# Patient Record
Sex: Male | Born: 1975 | Race: Black or African American | Hispanic: No | Smoking: Former smoker
Health system: Southern US, Community
[De-identification: ages and names within clinical notes are randomized; demographics above are authoritative.]

## PROBLEM LIST (undated history)

## (undated) DIAGNOSIS — T8859XA Other complications of anesthesia, initial encounter: Secondary | ICD-10-CM

## (undated) DIAGNOSIS — N186 End stage renal disease: Secondary | ICD-10-CM

## (undated) DIAGNOSIS — F329 Major depressive disorder, single episode, unspecified: Secondary | ICD-10-CM

## (undated) DIAGNOSIS — I2510 Atherosclerotic heart disease of native coronary artery without angina pectoris: Secondary | ICD-10-CM

## (undated) DIAGNOSIS — E119 Type 2 diabetes mellitus without complications: Secondary | ICD-10-CM

## (undated) DIAGNOSIS — I5042 Chronic combined systolic (congestive) and diastolic (congestive) heart failure: Secondary | ICD-10-CM

## (undated) DIAGNOSIS — Z9889 Other specified postprocedural states: Secondary | ICD-10-CM

## (undated) DIAGNOSIS — T8189XA Other complications of procedures, not elsewhere classified, initial encounter: Secondary | ICD-10-CM

## (undated) DIAGNOSIS — I70209 Unspecified atherosclerosis of native arteries of extremities, unspecified extremity: Secondary | ICD-10-CM

## (undated) DIAGNOSIS — R112 Nausea with vomiting, unspecified: Secondary | ICD-10-CM

## (undated) DIAGNOSIS — I8290 Acute embolism and thrombosis of unspecified vein: Secondary | ICD-10-CM

## (undated) DIAGNOSIS — H269 Unspecified cataract: Secondary | ICD-10-CM

## (undated) DIAGNOSIS — R011 Cardiac murmur, unspecified: Secondary | ICD-10-CM

## (undated) DIAGNOSIS — K219 Gastro-esophageal reflux disease without esophagitis: Secondary | ICD-10-CM

## (undated) DIAGNOSIS — I10 Essential (primary) hypertension: Secondary | ICD-10-CM

## (undated) DIAGNOSIS — Z973 Presence of spectacles and contact lenses: Secondary | ICD-10-CM

## (undated) DIAGNOSIS — Z9289 Personal history of other medical treatment: Secondary | ICD-10-CM

## (undated) DIAGNOSIS — I219 Acute myocardial infarction, unspecified: Secondary | ICD-10-CM

## (undated) DIAGNOSIS — F32A Depression, unspecified: Secondary | ICD-10-CM

## (undated) DIAGNOSIS — Z89512 Acquired absence of left leg below knee: Secondary | ICD-10-CM

## (undated) DIAGNOSIS — D649 Anemia, unspecified: Secondary | ICD-10-CM

## (undated) DIAGNOSIS — T4145XA Adverse effect of unspecified anesthetic, initial encounter: Secondary | ICD-10-CM

## (undated) DIAGNOSIS — Z992 Dependence on renal dialysis: Secondary | ICD-10-CM

## (undated) HISTORY — PX: AV FISTULA PLACEMENT: SHX1204

## (undated) HISTORY — DX: Unspecified atherosclerosis of native arteries of extremities, unspecified extremity: I70.209

---

## 2004-12-27 ENCOUNTER — Inpatient Hospital Stay (HOSPITAL_COMMUNITY): Admission: EM | Admit: 2004-12-27 | Discharge: 2004-12-31 | Payer: Self-pay | Admitting: Emergency Medicine

## 2004-12-29 ENCOUNTER — Encounter (INDEPENDENT_AMBULATORY_CARE_PROVIDER_SITE_OTHER): Payer: Self-pay | Admitting: Interventional Cardiology

## 2005-03-26 ENCOUNTER — Emergency Department (HOSPITAL_COMMUNITY): Admission: EM | Admit: 2005-03-26 | Discharge: 2005-03-26 | Payer: Self-pay | Admitting: Emergency Medicine

## 2005-05-21 ENCOUNTER — Inpatient Hospital Stay (HOSPITAL_COMMUNITY): Admission: EM | Admit: 2005-05-21 | Discharge: 2005-05-27 | Payer: Self-pay | Admitting: Emergency Medicine

## 2005-05-21 ENCOUNTER — Ambulatory Visit: Payer: Self-pay | Admitting: Pulmonary Disease

## 2005-05-28 ENCOUNTER — Ambulatory Visit: Payer: Self-pay | Admitting: Family Medicine

## 2005-06-27 ENCOUNTER — Ambulatory Visit: Payer: Self-pay | Admitting: *Deleted

## 2005-07-07 ENCOUNTER — Ambulatory Visit: Payer: Self-pay | Admitting: Internal Medicine

## 2005-07-21 ENCOUNTER — Ambulatory Visit: Payer: Self-pay | Admitting: Internal Medicine

## 2005-07-22 ENCOUNTER — Ambulatory Visit: Payer: Self-pay | Admitting: Internal Medicine

## 2005-07-30 ENCOUNTER — Ambulatory Visit: Payer: Self-pay | Admitting: Internal Medicine

## 2006-02-22 ENCOUNTER — Inpatient Hospital Stay (HOSPITAL_COMMUNITY): Admission: EM | Admit: 2006-02-22 | Discharge: 2006-02-24 | Payer: Self-pay | Admitting: Emergency Medicine

## 2006-02-23 ENCOUNTER — Encounter (INDEPENDENT_AMBULATORY_CARE_PROVIDER_SITE_OTHER): Payer: Self-pay | Admitting: Interventional Cardiology

## 2006-07-04 ENCOUNTER — Inpatient Hospital Stay (HOSPITAL_COMMUNITY): Admission: EM | Admit: 2006-07-04 | Discharge: 2006-07-07 | Payer: Self-pay | Admitting: Emergency Medicine

## 2006-07-05 ENCOUNTER — Encounter (INDEPENDENT_AMBULATORY_CARE_PROVIDER_SITE_OTHER): Payer: Self-pay | Admitting: Interventional Cardiology

## 2006-08-11 ENCOUNTER — Inpatient Hospital Stay (HOSPITAL_COMMUNITY): Admission: EM | Admit: 2006-08-11 | Discharge: 2006-08-27 | Payer: Self-pay | Admitting: Emergency Medicine

## 2006-08-12 ENCOUNTER — Encounter: Payer: Self-pay | Admitting: Vascular Surgery

## 2006-08-15 ENCOUNTER — Encounter: Payer: Self-pay | Admitting: Vascular Surgery

## 2006-08-15 ENCOUNTER — Ambulatory Visit: Payer: Self-pay | Admitting: *Deleted

## 2006-08-29 ENCOUNTER — Ambulatory Visit (HOSPITAL_COMMUNITY): Admission: RE | Admit: 2006-08-29 | Discharge: 2006-08-29 | Payer: Self-pay | Admitting: Vascular Surgery

## 2006-10-07 ENCOUNTER — Ambulatory Visit: Payer: Self-pay | Admitting: Vascular Surgery

## 2006-12-26 ENCOUNTER — Ambulatory Visit (HOSPITAL_COMMUNITY): Admission: RE | Admit: 2006-12-26 | Discharge: 2006-12-26 | Payer: Self-pay | Admitting: Nephrology

## 2007-04-18 ENCOUNTER — Encounter: Admission: RE | Admit: 2007-04-18 | Discharge: 2007-04-18 | Payer: Self-pay | Admitting: Nephrology

## 2008-03-06 ENCOUNTER — Encounter (INDEPENDENT_AMBULATORY_CARE_PROVIDER_SITE_OTHER): Payer: Self-pay | Admitting: Nurse Practitioner

## 2008-03-06 LAB — CONVERTED CEMR LAB
AST: 12 units/L
Basophils Relative: 0.5 %
Chloride: 90 meq/L
Cholesterol: 243 mg/dL
Eosinophils Relative: 3.3 %
Ferritin: 422 ng/mL
Glucose: 262 mg/dL
HCT: 39.4 %
Hemoglobin: 13.5 g/dL
MCHC: 34.3 g/dL
Monocytes Relative: 4.6 %
Potassium: 4.5 meq/L
TIBC: 232 ug/dL
Total Bilirubin: 1.1 mg/dL
UIBC: 92 ug/dL

## 2008-03-15 ENCOUNTER — Encounter (INDEPENDENT_AMBULATORY_CARE_PROVIDER_SITE_OTHER): Payer: Self-pay | Admitting: Nurse Practitioner

## 2008-10-28 ENCOUNTER — Encounter: Admission: RE | Admit: 2008-10-28 | Discharge: 2008-10-28 | Payer: Self-pay | Admitting: Nephrology

## 2009-03-29 ENCOUNTER — Emergency Department (HOSPITAL_COMMUNITY): Admission: EM | Admit: 2009-03-29 | Discharge: 2009-03-30 | Payer: Self-pay | Admitting: Emergency Medicine

## 2009-06-26 ENCOUNTER — Encounter: Admission: RE | Admit: 2009-06-26 | Discharge: 2009-06-26 | Payer: Self-pay | Admitting: Nephrology

## 2009-07-09 ENCOUNTER — Encounter: Admission: RE | Admit: 2009-07-09 | Discharge: 2009-07-09 | Payer: Self-pay | Admitting: Nephrology

## 2009-07-17 ENCOUNTER — Encounter: Admission: RE | Admit: 2009-07-17 | Discharge: 2009-07-17 | Payer: Self-pay | Admitting: Nephrology

## 2009-08-01 ENCOUNTER — Encounter: Admission: RE | Admit: 2009-08-01 | Discharge: 2009-08-01 | Payer: Self-pay | Admitting: Nephrology

## 2009-12-02 ENCOUNTER — Encounter: Admission: RE | Admit: 2009-12-02 | Discharge: 2009-12-02 | Payer: Self-pay | Admitting: Nephrology

## 2010-01-29 ENCOUNTER — Inpatient Hospital Stay (HOSPITAL_COMMUNITY): Admission: EM | Admit: 2010-01-29 | Discharge: 2010-02-02 | Payer: Self-pay | Admitting: Emergency Medicine

## 2010-02-01 ENCOUNTER — Encounter (INDEPENDENT_AMBULATORY_CARE_PROVIDER_SITE_OTHER): Payer: Self-pay | Admitting: Internal Medicine

## 2010-06-09 ENCOUNTER — Ambulatory Visit (HOSPITAL_COMMUNITY): Admission: RE | Admit: 2010-06-09 | Discharge: 2010-06-09 | Payer: Self-pay | Admitting: Nephrology

## 2010-11-01 LAB — URINALYSIS, ROUTINE W REFLEX MICROSCOPIC
Glucose, UA: NEGATIVE mg/dL
Ketones, ur: 15 mg/dL — AB
Protein, ur: >300 mg/dL — AB
Urobilinogen, UA: 0.2 mg/dL (ref 0.0–1.0)
pH: 8 (ref 5.0–8.0)

## 2010-11-01 LAB — RENAL FUNCTION PANEL
Albumin: 3.3 g/dL — ABNORMAL LOW (ref 3.5–5.2)
BUN: 62 mg/dL — ABNORMAL HIGH (ref 6–23)
CO2: 28 mEq/L (ref 19–32)
Chloride: 91 mEq/L — ABNORMAL LOW (ref 96–112)
Creatinine, Ser: 13.98 mg/dL — ABNORMAL HIGH (ref 0.4–1.5)
GFR calc Af Amer: 6 mL/min — ABNORMAL LOW (ref >60)
GFR calc non Af Amer: 5 mL/min — ABNORMAL LOW (ref >60)
Glucose, Bld: 108 mg/dL — ABNORMAL HIGH (ref 70–99)
Glucose, Bld: 82 mg/dL (ref 70–99)
Phosphorus: 4.5 mg/dL (ref 2.3–4.6)
Potassium: 3.9 mEq/L (ref 3.5–5.1)
Sodium: 131 mEq/L — ABNORMAL LOW (ref 135–145)

## 2010-11-01 LAB — PHOSPHORUS: Phosphorus: 5.3 mg/dL — ABNORMAL HIGH (ref 2.3–4.6)

## 2010-11-01 LAB — COMPREHENSIVE METABOLIC PANEL
ALT: 41 U/L (ref 0–53)
AST: 16 U/L (ref 0–37)
AST: 39 U/L — ABNORMAL HIGH (ref 0–37)
Albumin: 3.2 g/dL — ABNORMAL LOW (ref 3.5–5.2)
BUN: 25 mg/dL — ABNORMAL HIGH (ref 6–23)
CO2: 28 mEq/L (ref 19–32)
Calcium: 9.1 mg/dL (ref 8.4–10.5)
Chloride: 93 mEq/L — ABNORMAL LOW (ref 96–112)
Creatinine, Ser: 12.69 mg/dL — ABNORMAL HIGH (ref 0.4–1.5)
GFR calc Af Amer: 6 mL/min — ABNORMAL LOW (ref >60)
GFR calc Af Amer: 7 mL/min — ABNORMAL LOW (ref >60)
Potassium: 3.5 mEq/L (ref 3.5–5.1)
Sodium: 134 mEq/L — ABNORMAL LOW (ref 135–145)
Total Bilirubin: 0.9 mg/dL (ref 0.3–1.2)
Total Bilirubin: 1.1 mg/dL (ref 0.3–1.2)
Total Protein: 7.9 g/dL (ref 6.0–8.3)

## 2010-11-01 LAB — DIFFERENTIAL
Basophils Absolute: 0 10*3/uL (ref 0.0–0.1)
Lymphs Abs: 1.4 10*3/uL (ref 0.7–4.0)
Neutro Abs: 9.2 10*3/uL — ABNORMAL HIGH (ref 1.7–7.7)
Neutrophils Relative %: 82 % — ABNORMAL HIGH (ref 43–77)

## 2010-11-01 LAB — GLUCOSE, CAPILLARY
Glucose-Capillary: 119 mg/dL — ABNORMAL HIGH (ref 70–99)
Glucose-Capillary: 140 mg/dL — ABNORMAL HIGH (ref 70–99)
Glucose-Capillary: 141 mg/dL — ABNORMAL HIGH (ref 70–99)
Glucose-Capillary: 150 mg/dL — ABNORMAL HIGH (ref 70–99)
Glucose-Capillary: 193 mg/dL — ABNORMAL HIGH (ref 70–99)
Glucose-Capillary: 202 mg/dL — ABNORMAL HIGH (ref 70–99)
Glucose-Capillary: 222 mg/dL — ABNORMAL HIGH (ref 70–99)
Glucose-Capillary: 71 mg/dL (ref 70–99)
Glucose-Capillary: 78 mg/dL (ref 70–99)
Glucose-Capillary: 81 mg/dL (ref 70–99)
Glucose-Capillary: 94 mg/dL (ref 70–99)

## 2010-11-01 LAB — CBC
HCT: 34.6 % — ABNORMAL LOW (ref 39.0–52.0)
Hemoglobin: 11.7 g/dL — ABNORMAL LOW (ref 13.0–17.0)
Hemoglobin: 12 g/dL — ABNORMAL LOW (ref 13.0–17.0)
MCHC: 34 g/dL (ref 30.0–36.0)
MCHC: 34.2 g/dL (ref 30.0–36.0)
MCV: 84.6 fL (ref 78.0–100.0)
MCV: 85.7 fL (ref 78.0–100.0)
Platelets: 260 10*3/uL (ref 150–400)
Platelets: 278 10*3/uL (ref 150–400)
RBC: 4.17 MIL/uL — ABNORMAL LOW (ref 4.22–5.81)
RDW: 15.2 % (ref 11.5–15.5)
RDW: 15.4 % (ref 11.5–15.5)
WBC: 12.3 10*3/uL — ABNORMAL HIGH (ref 4.0–10.5)
WBC: 8.3 10*3/uL (ref 4.0–10.5)

## 2010-11-01 LAB — URINE MICROSCOPIC-ADD ON

## 2010-11-01 LAB — CULTURE, BLOOD (ROUTINE X 2)
Culture: NO GROWTH
Culture: NO GROWTH

## 2010-11-01 LAB — URINE CULTURE

## 2010-11-01 LAB — HEMOGLOBIN A1C: Hgb A1c MFr Bld: 8 % — ABNORMAL HIGH (ref <5.7)

## 2010-11-21 LAB — DIFFERENTIAL
Lymphocytes Relative: 29 % (ref 12–46)
Lymphs Abs: 3.3 10*3/uL (ref 0.7–4.0)
Monocytes Absolute: 1 10*3/uL (ref 0.1–1.0)
Monocytes Relative: 9 % (ref 3–12)
Neutro Abs: 6.7 10*3/uL (ref 1.7–7.7)
Neutrophils Relative %: 60 % (ref 43–77)

## 2010-11-21 LAB — CBC
Hemoglobin: 15.3 g/dL (ref 13.0–17.0)
RBC: 5.19 MIL/uL (ref 4.22–5.81)
WBC: 11.1 10*3/uL — ABNORMAL HIGH (ref 4.0–10.5)

## 2010-11-21 LAB — POCT I-STAT, CHEM 8
Calcium, Ion: 1.19 mmol/L (ref 1.12–1.32)
Creatinine, Ser: 10.2 mg/dL — ABNORMAL HIGH (ref 0.4–1.5)
Glucose, Bld: 212 mg/dL — ABNORMAL HIGH (ref 70–99)
HCT: 50 % (ref 39.0–52.0)
Hemoglobin: 17 g/dL (ref 13.0–17.0)
TCO2: 30 mmol/L (ref 0–100)

## 2011-01-01 NOTE — H&P (Signed)
[name], [name]           ACCOUNT NO.:  [id number]   MEDICAL RECORD NO.:  [id number]          PATIENT TYPE:  INP   LOCATION:  3111                         FACILITY:  [facility]   PHYSICIAN:  Edythe Lynn, M.D.       DATE OF BIRTH:  07-05-1976   DATE OF ADMISSION:  05/20/2005  DATE OF DISCHARGE:                                HISTORY & PHYSICAL   CHIEF COMPLAINT:  Altered mental status.   HISTORY OF PRESENT ILLNESS:  Mr. Kirner is a 35 year old African American man  with type 1 diabetes and nephrotic syndrome, with a history of Streptococcus  pneumonia with positive blood cultures in May 2006, who was brought into the  emergency room by his family because of an altered mental status.  Apparently the last time the patient was doing okay was yesterday afternoon  at about 6 p.m.  He arrived at the emergency room at around 2349 hours, so  about six hours after he was last doing fine.  At the time of arrival he was  very agitated and confused with an elevated blood pressure and really high  CBGs.  I was called to admit this patient by the emergency room physician.  When I arrived at the bedside, the patient was seizing.  He was with pupils  really dilated and eyes deviated to the right side.  We proceeded with  immediate sedation and orotracheal intubation and an emergent computed  tomography scan of the head, to rule out any intracranial bleed.   PAST MEDICAL HISTORY:  1.  Diabetes mellitus type 1.  2.  Streptococcus pneumonia in 2006.   HOME MEDICATIONS:  Insulin.   SOCIAL HISTORY:  The patient is single.  He smokes 1/2 pack of cigarettes a  day.  Does not drink alcohol.  The family report no cocaine use.   REVIEW OF SYSTEMS:  Unobtainable.   FAMILY HISTORY:  Per old records.  His mother is alive and has diabetes.   PHYSICAL EXAMINATION:  VITAL SIGNS:  Temperature 97.8 degrees, blood  pressure 218/116, heart rate 110, respirations 24.  The second set of vitals  show the blood  pressure 245/131, heart rate 142, respirations 22, saturation  92% on room air.  GENERAL:  Upon arrival, as stated above, the patient was seizing.  He was  unresponsive and after the seizure he started moving all four extremities,  but he was not cooperating or alert or doing anything meaningful.  He is an  obese gentleman and he has anasarca.  LUNGS:  Clear in the anterior fields.  HEART:  Tachycardic with regular heart rate, without murmurs, rubs or  gallops.  ABDOMEN:  Obese, soft.  Good bowel sounds.  SKIN:  No suspicious rashes.  NEUROLOGIC:  Impossible to perform but grossly the patient moves all four  extremities.   LABORATORY DATA:  On admission show a sodium of 127, potassium 2.8, chloride  95, bicarbonate 18, BUN 13, creatinine 1.5, glucose 581.  He has ketones  positive in the urine.   An electrocardiogram shows sinus tachycardia.  No ST-T changes.   He has acetone small in  the blood.  Phosphorus of 4.1, magnesium 1.1.  His  urinalysis is positive for ketones and proteins.  His albumin is really low  at 1.3.  His urine drug screen was negative for cocaine.  His white blood  cell count is 14,000, hemoglobin 9.7, platelet count 369.   A head CT that was obtained did not show any bleed.  A portable chest x-ray  showed very low lung volumes and no infiltrates.   ASSESSMENT/PLAN:  1.  Altered mental status.  At this point it is really unclear why this      patient has an altered mental status.  This could be a hypertensive      encephalopathy, but this is an unusual new onset hypertension with such      really elevated blood pressure readings.  Of note, the patient's      previous blood pressure levels were within normal limits.  Other      possibilities are status epilepticus-type seizures with post-ictal      confusion, but that would not explain the persistently elevated blood      pressure.  The plan at this moment is to sedate the patient and control      the blood  pressure with a Nipride drip, but monitor closely enough not      to drop the systolic blood pressure below 160.  If there are any      recurrent seizures, will give Dilantin IV.  Although the patient is not      febrile, we will have to be very careful to rule out any possible      meningitis.  We will try to obtain a neurological consultation for a      lumbar puncture, as this patient is really obese, and it will be very      difficult to perform a lumbar puncture.  Until the lumbar puncture is      performed, we are going to empirically treat him with Rocephin,      vancomycin and acyclovir.  2.  Diabetes ketoacidosis:  We will start the patient on intravenous      insulin.  We will be careful with aggressive hydration, as this patient      already has anasarca and very elevated blood pressure levels.  3.  Hypokalemia:  Will try to replete aggressively the potassium and abstain      from using IV insulin until we correct a little bit the potassium.  4.  Hyponatremia:  This is spurious, secondary to hyperglycemia.  5.  Nephrotic syndrome, likely from diabetes, but further workup can be done      if deemed necessary.  6.  GI prophylaxis will be done with Protonix, deep venous thrombosis      prophylaxis with Lovenox.      Edythe Lynn, M.D.  Electronically Signed     SL/MEDQ  D:  05/21/2005  T:  05/21/2005  Job:  [id number]

## 2011-01-01 NOTE — H&P (Signed)
[name], [name]           ACCOUNT NO.:  [id number]   MEDICAL RECORD NO.:  [id number]          PATIENT TYPE:  INP   LOCATION:  1828                         FACILITY:  [facility]   PHYSICIAN:  Benito Mccreedy, M.D.DATE OF BIRTH:  30-Nov-1975   DATE OF ADMISSION:  02/22/2006  DATE OF DISCHARGE:                                HISTORY & PHYSICAL   CHIEF COMPLAINT:  Nausea and vomiting.   HISTORY OF PRESENT ILLNESS:  The patient is a 35 year old gentleman who  presented with several days history of nausea with vomiting.  Onset was  gradual.  The vomiting was not projectile in nature.  He has had some  epigastric abdominal pain as well as headaches.  There has not been any  diarrhea, constipation, fever or chills or dysuria.  At admission, the  patient was noted to be hypertensive with systolic blood pressure as high as  221.  He was also noted to be hyperglycemic.  His potassium was noted to be  very low.  He was given potassium chloride IV with insulin, and hospitalist  service was __________  for admission after initiation of labetalol IV push.   PAST MEDICAL HISTORY:  1.  Positive for history of poorly controlled type 1 diabetes with      hemoglobin A1C of 16.5 in October 2006.  2.  He is also suspected to have nephrotic syndrome and has been seen      apparently by Dr. Mercy Moore of nephrology.  3.  History of hypertension which he is poorly controlled on.  4.  History of obesity and hypoalbuminemia.   MEDICATION HISTORY:  The patient does not have any known medication  allergies.  He is on:  1.  Labetalol 400 mg b.i.d.  2.  Lantus 20 units q.h.s.  3.  Lisinopril 20 mg daily.   FAMILY HISTORY:  Positive for diabetes mellitus.   SOCIAL HISTORY:  The patient smokes half pack of cigarettes daily but denies  use of alcohol or any illicit drugs.   REVIEW OF SYSTEMS:  As noted above.  Otherwise, unremarkable.   PHYSICAL EXAMINATION:  VITAL SIGNS:  Blood pressure was 221/125,  heart rate  68, respirations 20, temp 97.5 degrees Fahrenheit, O2 sat of 99%.  GENERAL:  He was not in acute cardiopulmonary distress.  HEENT:  Normocephalic, atraumatic.  Pupils were equal and reactive to light.  Extraocular movements intact.  Mucous membranes were dry.  NECK:  Supple.  No JVD.  CARDIAC:  Regular rate and rhythm.  No gallops or murmur.  ABDOMEN:  Obese, soft, nontender.  Bowel sounds present.  LUNGS:  Clear to auscultation.  EXTREMITIES:  The patient had 2+ bipedal pitting edema.  __________  focal deficits.   LABORATORIES:  WBC count 8.3, hemoglobin 13.8, hematocrit 40.5, platelet  count 578.  Sodium 127, potassium was less than 2 on arrival.  Chloride 88,  CO2 of 23, glucose 760, BUN 15, creatinine 2.5, bilirubin 1.2, __________ is  75, AST 18, ALT 10, total protein 5.1, albumin 1.1, calcium 7.9, lipase 20.  UA indicated clear appearance.  Specific gravity of 1.026, __________ 6.5,  glucose  of more than 1,000 mg/dL, negative bilirubin, ketones 15, moderate  blood, protein more than 300, urobilinogen 0.2, nitrites negative,  leukocytes negative, few squamous epithelial cells.  WBCs 0-2 per high-  powered field, RBCs 7-10 per high-powered field.  There __________  noted.   IMPRESSION:  1.  Hyperosmolar hyperglycemia state.  2.  Hypertensive urgency/isolated hypertension.  3.  Severe hypokalemia secondary to gastrointestinal loss of potassium.  4.  Bilateral lower extremity edema.  5.  Hypoalbuminemia.  6.  Renal insufficiency with possible nephritic syndrome.   PLAN:  The patient was started on careful IV fluid supplementation with IV  insulin Glucommander protocol __________  .  Careful IV fluid  supplementation with IV insulin Glucommander protocol.  The patient had  severe hypokalemia.  Aggressive potassium repletion __________  .  Will get  a 12-lead EKG and 2D echocardiogram with evidence of LVH or any cardiac  disease.  Will check a hemoglobin A1C.  Will  start him on labetalol drip.  The patient has received IV labetalol without any significant improvement in  his blood pressure and titrated to keep his systolic blood pressure below  150.  __________ electrolytes he was treated.  Will check his fasting lipid  panel as well.      Benito Mccreedy, M.D.  Electronically Signed     GO/MEDQ  D:  02/23/2006  T:  02/23/2006  Job:  [id number]

## 2011-01-01 NOTE — Consult Note (Signed)
NAMEFREDERIC, [name]           ACCOUNT NO.:  [id number]   MEDICAL RECORD NO.:  [id number]          PATIENT TYPE:  INP   LOCATION:  [id number]                         FACILITY:  [facility]   PHYSICIAN:  Larey Seat, M.D.  DATE OF BIRTH:  Nov 20, 1975   DATE OF CONSULTATION:  05/21/2005  DATE OF DISCHARGE:  05/27/2005                                   CONSULTATION   HISTORY OF PRESENT ILLNESS:  [name] is a 35 year old African-  American gentleman right-handed with a type one diabetes history and  nephrotic syndrome who was about three weeks prior to this admission  admitted to the [facility] system with streptococcus pneumonia  which also caused a cough bacteremia and he had positive blood cultures as  far back as May 2006.  At that time he was brought into the emergency room  because of shortness of breath and fever.  This time because of altered  mental status.  Apparently the last time the patient was doing quite well  was on May 19, 2005 in the afternoon prior to his admission and he  arrived in the emergency room about midnight last was seen six hours prior  doing well.  At the time of his arrival he seemed to be in delusions,  confused, had a very highly elevated blood pressure and abnormal blood  glucose levels in the 500 range.  The patient was admitted to the internal  medicine service under the attending doctor Dr. Edythe Lynn and the patient  was seizing when Dr. Marye Round and examined him in the ER.  His pupils are widely  dilated, his eyes were deviated to the right side indicating a complex  partial seizure.  The patient was sedated and intubated in the ER and then  admitted to 3100 neurological ICU floor where I was asked to see him in a  consultation and CT scan that was obtained showed no abnormal intracranial  structures especially no bleed.   PAST MEDICAL HISTORY:  Streptococcus pneumonia, diabetes mellitus, new onset  seizure, mental status  changes.   CURRENT MEDICATIONS:  Home medications include insulin only.   SOCIAL HISTORY:  The patient is single.  Smokes daily.  Does not drink  alcohol. The family has not reported any illegal drug use.   REVIEW OF SYSTEMS:  The review of systems was unobtainable but according to  his family members he has had trouble with memory, fatigability and had over  the summer increasing shortness of breath.   FAMILY HISTORY:  His mother is alive.  Had diabetes type 2.  There are  healthy siblings per old records.  There is also a family history of  hypertension and congestive heart failure.   PHYSICAL EXAMINATION:  VITAL SIGNS:  Blood pressure is now 164/100, heart  rate is still 105, respirations 22.  The patient is no longer seizing but is  severely sedated now after Ativan was given.  He is not able to cooperate  without falling asleep during the examination.  He is a rather obese  gentleman given his young age of 11 and he has anasarca.  He is producing  downgoing toes to plantar stimulation.  His deep tendon reflexes however are  very attenuated.  His eye movements seem to cover the full horizontal plane  now.  After sternal rub he is able to open his eyes and look from left-to-  right, right-to-left. I was also able to get him to move his neck. I see no  rigidity or meningism there.  There is no Kernig or Brudzinski sign,   The patient does not cooperate well with the sensory examination but  withdraws all extremities to be to pinch or to tickle.   LABORATORY DATA:  His white blood cell count is still about 14,000, platelet  count is around 370,000.  Urine is positive for ketones and protein.   DISPOSITION:  Altered mental status. This is an encephalopathy likely of  metabolic origin but the patient's seize is rather unusual for high a serum  glucose levels but the seizure might also explain his high blood pressure.  I agree with the control of blood pressure with a Nipride or  labetalol drip  and with the treatment of seizures by Dilantin - Phosphenytoin IV.  I was  asked by Dr. Edythe Lynn if I could perform a lumbar puncture but first of  all the patient is very obese and it is hard to treat him to do the lumbar  puncture in the ICU setting with an obese patient.  Secondarily he does not  show any signs of meningism.  He might well still be at risk for an  encephalitis.  I agree therefore with the empirical treatment of Rocephin,  Vancomycin and acyclovir and explained to his family that that should cover  all possible infectious agents that we know that could cause an  encephalitis.  It is my feeling however, that the diabetic ketoacidosis is  at the base of the patient's mental status changes and seizures.  An EEG is  pending and is ordered for later today.           ______________________________  Larey Seat, M.D.     CD/MEDQ  D:  06/03/2005  T:  06/03/2005  Job:  [id number]   cc:   Dr. _____ Audria Nine (?)

## 2011-01-01 NOTE — H&P (Signed)
NAMEJIDENNA, [name] NO.:  [id number]   MEDICAL RECORD NO.:  [id number]          PATIENT TYPE:  EMS   LOCATION:  MAJO                         FACILITY:  [facility]   PHYSICIAN:  Delanna Ahmadi, M.D.  DATE OF BIRTH:  [birth year]   DATE OF ADMISSION:  08/11/2006  DATE OF DISCHARGE:                              HISTORY & PHYSICAL   CHIEF COMPLAINT:  Right leg pain and progressive swelling.   HISTORY OF PRESENT ILLNESS:  This is a 35 year old African-American male  with history of insulin-requiring diabetes mellitus, nephrotic syndrome,  chronic renal insufficiency, anasarca, hypertension, and severe  dyslipidemia who presents with a couple-day history of right lateral  calf and knee pain with ambulation and progressive swelling.  He denies  fevers, chills, cough, shortness of breath, or chest pain.  He was  admitted to the hospital on November 19, with a hyperosmolar state,  anasarca, acute renal failure, hypokalemia, and dyslipidemia.  During  that admission he had a two-dimensional echocardiogram which showed a  borderline low ejection fraction.  His ACE inhibitor was stopped and he  was placed on Norvasc and continued on labetalol for blood pressure.  Insulin was continued for blood sugars.  After 2 days, he felt like  nothing was getting done for him and he left AMA.  He does not have a  primary physician.  He is applying on Medicaid and plans on attending  [facility] for his primary care.  He has seen Louis Meckel,  M.D. of the renal service but has not followed up with him in  approximately 1 year.   PAST MEDICAL HISTORY:  As noted above.   PAST SURGICAL HISTORY:  Not addressed.   ALLERGIES:  No known drug allergies.   CURRENT MEDICATIONS:  1. Insulin Lantus 20 units q.h.s.  2. Labetalol 400 mg b.i.d.  3. Norvasc 5 mg a day.  4. Question Zocor.   FAMILY HISTORY:  Mother with diabetes mellitus, hypertension, and renal  failure.  Father died  of lung cancer.  Sister and brother with diabetes  mellitus.   SOCIAL HISTORY:  He is married.  Unemployed.  He was in prison for 8  years and has been out of prison for 2-1/2 years.  History of cocaine  use, positive UDS in November of 2007.  No alcohol.  He is a smoker.   REVIEW OF SYSTEMS:  As above.   PHYSICAL EXAMINATION:  GENERAL:  Obese black male.  VITAL SIGNS:  Blood pressure 175/108, heart rate 96, respirations 22,  temperature 98.1, oxygen saturation 100% on room air.  HEENT:  Pupils are equal and respond to light.  Extraocular movements  are intact.  Ears blocked by cerumen.  Oropharynx is clear.  NECK:  Supple.  No lymphadenopathy.  LUNGS:  Some crackles at the bases.  HEART:  Regular rate and rhythm without murmurs, rubs, or gallops.  ABDOMEN:  Obese, soft, and nontender.  No mass or hepatosplenomegaly.  EXTREMITIES:  Show 4+ bilateral pretibial edema.  The right leg shows  some warmth and mild hyperpigmentation in the right lateral upper calf.  There is  no pain on motion of the knee.  NEUROLOGY:  Nonfocal.   LABORATORY DATA:  CBC; WBC 16.9, hemoglobin 8.8, MCV 81.0, platelet  count 595.  Chemistries; sodium 139, potassium 2.5, chloride 108,  bicarbonate 23, glucose 92, BUN 29, creatinine 4.2, calcium 7.2, total  protein 5.1.  Albumin less than 1.0, SGOT 20, SGPT 13, alkaline  phosphatase elevated.  UA shows greater than 300 of protein, no white  cells.   EKG, chest x-ray, PT, and PTT pending.   IMPRESSION:  1. Question right leg cellulitis versus deep venous thrombosis.  2. Acute on chronic renal failure.  3. Nephrotic syndrome/anasarca.  4. Hypertension, poorly controlled.  5. Diabetes mellitus type 2 on insulin.  6. Anemia likely related to chronic renal insufficiency.  7. Poor medical follow-up/probable noncompliance.  8. History of cocaine abuse, denies any since discharge on July 06, 2006.   PLAN:  Admit.  IV antibiotics per pharmacy.  Renal  consult.  Venous  ultrasound to rule out DVT in lower extremities.  Diuresis.  Watch  creatinine.  Blood pressure medicines to follow.  Cover with Lovenox for  now.  Anemia workup.           ______________________________  Delanna Ahmadi, M.D.     JJG/MEDQ  D:  08/11/2006  T:  08/11/2006  Job:  [id number]

## 2011-01-01 NOTE — Op Note (Signed)
NAMEJEMERY, [name]           ACCOUNT NO.:  [id number]   MEDICAL RECORD NO.:  [id number]          PATIENT TYPE:  INP   LOCATION:  5511                         FACILITY:  [facility]   PHYSICIAN:  Brevin Durkee. Ninfa Linden, M.D.DATE OF BIRTH:  1975-11-22   DATE OF PROCEDURE:  08/19/2005  DATE OF DISCHARGE:                               OPERATIVE REPORT   PREOPERATIVE DIAGNOSES:  1. Right leg/knee abscess.  2. Right knee joint effusion.   PROCEDURE:  1. Open irrigation and debridement of right knee/leg abscess.  2. Irrigation and debridement of right knee joint.   FINDINGS:  1. Large purulent abscess, right lateral knee, that is superficial to      the iliotibial band.  2. Moderate to large right knee effusion.   PROCEDURE:  1. Irrigation and debridement of right knee abscess.  2. Irrigation and debridement of right knee joint.   SURGEON:  Camdynn Aldis. Ninfa Linden, M.D.   ANESTHESIA:  General.   ANTIBIOTICS:  One gram of vancomycin.   CULTURES:  Sent and pending.   BLOOD LOSS:  Less than 50 mL.   COMPLICATIONS:  None.   INDICATIONS:  Briefly, [name] is a 35 year old male who was  admitted on August 11, 2006 with increased right leg pain and  swelling, thought to have a presumptive diagnosis of cellulitis.  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  He is on dialysis and he has been considered for some type of  grafting procedure for dialysis access.  After several days in the  hospital and no response with antibiotics, an MRI was obtained of his  right knee and leg and it showed a moderate joint fusion as well as a  large abscess superficial to the iliotibial band on the lateral aspect  of the right knee; this was performed yesterday and Orthopedic Surgery  was consulted today to address the abscess.  Upon discussion with the  patient, he said that he has had this pain in his leg for about a month  now.  On examination, he  had significant erythema about his leg and a  palpable fluctuant area and significant pain with range of motion of his  knee.  I did review the MRI and it showed the aforementioned abscess and  knee effusion, and I deemed it necessary to bring him to the operating  room on the day of consultation for urgent irrigation and debridement.  The risks and benefits of this were explained to him and well understood  and he agreed to proceed with surgery.   PROCEDURE NOTE:  After informed consent was obtained, the appropriate  right leg was marked.  [name] was brought to the operating room and  placed supine on the operating table.  A general anesthesia was then  obtained and a nonsterile tourniquet was placed around his upper right  thigh and his leg was prepped and draped with DuraPrep and sterile  drapes including sterile stockinette.  I then elevated the leg and the  tourniquet was set to 350 mL of pressure.  I made a direct lateral  incision over  the abscessed area and carried this down to the deep  tissues and an abundant gross purulent abscess was noted.  I spread this  further proximally and distally to expose the abscess and cultures were  obtained and sent.  He was given antibiotics and I copiously drained the  abscess.  This did not appear to be related to the knee joint itself.  I  then used 1000 mL of bacitracin solution using pulsatile lavage and then  another 3 L of normal saline solution using pulsatile lavage; I  thoroughly cleaned this wound.  Next, I turned my attention toward the  knee joint.  Two small portals were made, one at the superolateral  aspect of the knee joint and one at the anterolateral aspects just  lateral to the patellar tendon.  Two arthroscopic cannulas were inserted  and a large knee effusion was drained from the knee.  I then used  pulsatile lavage to run 3 L of normal saline solution through the knee  joint as well to copiously wash this out.  After  this was done, all  instrumentation was removed and the knee was drained of residual fluid.  The portal sites were closed with interrupted 2-0 nylon suture and then  I closed the lateral wound, just the skin tissue with an interrupted 2-0  nylon suture, but prior to closure, I did place a Penrose drain.  Xeroform followed by ABDs and a well-padded sterile dressing were  applied and the patient was awakened, extubated, and taken to the  recovery room in stable condition.  Cultures will be pending and he will  be continued on antibiotics.           ______________________________  [name]. Ninfa Linden, M.D.     CYB/MEDQ  D:  08/19/2006  T:  08/20/2006  Job:  [id number]

## 2011-01-01 NOTE — Discharge Summary (Signed)
[name], [name]           ACCOUNT NO.:  [id number]   MEDICAL RECORD NO.:  [id number]          PATIENT TYPE:  INP   LOCATION:  5511                         FACILITY:  [facility]   PHYSICIAN:  Helen Hashimoto, MD    DATE OF BIRTH:  February 27, 1976   DATE OF ADMISSION:  08/11/2006  DATE OF DISCHARGE:  08/27/2006                               DISCHARGE SUMMARY   ANTICIPATED DATE OF DISCHARGE:  August 27, 2006.   PRIMARY PHYSICIAN:  Delanna Ahmadi, M.D.   This patient left against medical advice.   DIAGNOSES:  At the time he left, include:  1. Left leg cellulitis with abscess with staphylococcus aureus.  2. End-stage renal disease.  3. Diabetes mellitus.  4. Hypertension.  5. History of anemia.  6. Dyslipidemia.  7. Obesity.  8. History of cocaine and marijuana use.  9. History of retinopathy.   Brief note about the hospital course, this patient is a 35 year old  African American male who was admitted on August 11, 2006, with a  complaint of right leg pain and swelling.  At that time, the patient had  an abscess with cellulitis that was drained and cultures were sent.  His  cultures were positive for staph aureus and the patient was started on  appropriate antibiotics.  This patient was seen by ID and his  antibiotics were adjusted.  Recommended to be discharged home on two  weeks of oral antibiotics.  The patient also had end stage renal disease  and he was seen by nephrology here in the hospital.  He was started on  hemodialysis during his hospitalization and he is supposed to continued  dialysis as an outpatient.  The patient will come back for a graft  placement next week which was already scheduled by nephrology.  During  the hospital, other medical problems have been stable and the patient  has been treated for diabetes and hypertension.  On the morning of  discharge, the patient insisted to leave before seen by a doctor.  I got  a call from the nurse but the patient  insisted not to stay and insisted  to leave against medical advice.  No prescriptions were given.  And, the  patient left without any medicine.  Advised by the nurse to contact his  primary physician as soon as possible to get a prescription for the  medicine that he needs and to follow with him as soon as possible.  He  was also advised to follow with his nephrologist for his dialysis and to  get his graft done.   Assessment time is 35 minutes.      Helen Hashimoto, MD  Electronically Signed     NAE/MEDQ  D:  08/27/2006  T:  08/27/2006  Job:  [id number]   cc:   Delanna Ahmadi, M.D.

## 2011-01-01 NOTE — Discharge Summary (Signed)
[name], [name]           ACCOUNT NO.:  [id number]   MEDICAL RECORD NO.:  [id number]          PATIENT TYPE:  INP   LOCATION:  [id number]                         FACILITY:  [facility]   PHYSICIAN:  [name], M.D.DATE OF BIRTH:  17-Feb-1976   DATE OF ADMISSION:  12/27/2004  DATE OF DISCHARGE:                                 DISCHARGE SUMMARY   DISCHARGE DIAGNOSES:  1.  Community-acquired pneumonia.      1.  Blood culture was positive for streptococcal pneumonia.  Sensitivity          pending at time of discharge.  2.  History of diabetes mellitus.  3.  Thrombocytosis secondary to pneumonia.  4.  Anemia, stable. Outpatient followup recommended.   DISCHARGE MEDICATIONS:  Ceftin 500 mg p.o. q.i.d. for nine days.   DISCHARGE LABORATORIES:  WBC count 10.1, hemoglobin 10.6, hematocrit 31.6,  MCV 80.5, platelet count 576.  Sodium 139, potassium 3.4, chloride 101, CO2  24, glucose 124, BUN 11, creatinine 1.1, calcium 7.2.   CONSULTANTS:  Dr. Eliane [name].   CONDITION ON DISCHARGE:  Improved and satisfactory.   REASON FOR ADMISSION:  Pneumonia.  The patient presented with cough, fever,  nausea, and vomiting.  X-ray revealed right middle lobe pneumonia and was  therefore admitted for management of this problem.  He was volume depleted  and his diabetes was uncontrolled with hyperglycemia.  Please see H&P by Dr. Minette [name] dictated Dec 27, 2004, for full details  regarding patient's presentation.   HOSPITAL COURSE:  The patient was admitted to the hospitalists service and  was started on antibiotics for his pneumonia.  Blood cultures were obtained  and they were positive for strep pneumonia.  However, sensitivity pattern  was not present at time of discharge.  The patient on admission had a  leukocytosis of more than 300,000 which had resolved at time of discharge on  antibiotic.  Fluid management had good control of his diabetes.  On rounds  today, he says he his feeling fine, is able  to take in p.o.'s without any  problems for the last 24 hours, has been afebrile for more than 48 hours. We  are waiting for the sensitivity pattern and that he will need to be followed  up by primary care physician.  His wife indicated to me that they have  arranged for him to be seen by Dr. Ellsworth [name] who was his primary care physician.  Therefore, we are going to discharge him home today on antibiotics, with  tentative plan for him to be in two weeks time by Dr. Ellsworth [name] at which time  his lab work including anemia and thrombocytosis will need to be followed  up.  He will need a repeat chest x-ray in about three weeks to four weeks.  The patient was hyponatremic which was also resolved with oral treatment.  I  have spent some time to explain to patient and his wife all the workup that  was done here in the hospital, the plan of workup, and a need to be at  adherent to his medicine and to antibiotics.  He was also counseled to  stop  smoking cigarettes and we offered to give him a patch of nicotine,  but he refuses and says he is going to look at it some time when he leaves  the hospital.  All his questions were essentially answered to wife  satisfactorily.  He is discharged in stable and satisfactory condition.   I spent more than 32 minutes preparing patient for discharge this morning.      GO/MEDQ  D:  12/31/2004  T:  12/31/2004  Job:  [id number]   cc:   [name], M.D.  [facility] [address]  [place]  [place] [address]  Fax: [phone]

## 2011-01-01 NOTE — Consult Note (Signed)
[name], [name]           ACCOUNT NO.:  [id number]   MEDICAL RECORD NO.:  [id number]          PATIENT TYPE:  INP   LOCATION:  5511                         FACILITY:  [facility]   PHYSICIAN:  Keaghan Kovalenko. Ninfa Linden, M.D.DATE OF BIRTH:  [birth year]   DATE OF CONSULTATION:  08/19/2006  DATE OF DISCHARGE:                                 CONSULTATION   REASON FOR CONSULTATION:  Right knee abscess and right knee joint  effusions.   HISTORY OF PRESENT ILLNESS:  [name] is 35 year old who I have been  asked to see to evaluate his right knee.  He said he has been having  pain in his knee for about a month now.  He was actually admitted to the  medical center on the 27th of last month which is 8 days ago secondary  to worsening leg pain.  This was felt to be cellulitis. and he was  started on antibiotics.  He is someone with end-stage renal disease and  is on dialysis and is being considered for graft placement by vascular  surgeons due to persistent fevers and pain.  An MRI was finally obtained  yesterday, and it showed a moderate-to-large knee joint effusion of the  right knee as well as an abscess superficial to the iliotibial band that  was a sizable abscess.  I was consulted today for this and upon review  of the MRI decided to take emergently to the operating room for  irrigation and debridement of the abscess.   PAST MEDICAL HISTORY:  1. Diabetes.  2. History of anemia.  3. Malignant hypertension.  4. History of dyslipidemia.  5. History of obesity.  6. History of cocaine and marijuana usage.  7. History of retinopathy.   REVIEW OF SYSTEMS:  Negative for chest pain, shortness of breath,  nausea, and vomiting.  Positive for fever.   CURRENT ANTIBIOTIC MEDICATIONS:  Zosyn and vancomycin.   SOCIAL HISTORY:  He is married.  He does not consume alcohol or drugs  currently, but he has previous drug use in the past.  He does smoke  cigarettes.   PHYSICAL EXAMINATION:   Temperature 99, blood pressure 181/102.  GENERAL:  This is an alert and oriented male in no acute distress.  Examination of the right lower extremity shows a palpable fluctuant area  that is quite painful on the lateral aspect of his knee just proximal to  the knee joint and the level of the knee joint.  He also has moderate  swelling in the joint itself and appears to have significant effusion.  He moves his toes easily, and range of motion of the knee causes  significant pain.   The MRI is reviewed and does show a sizable abscess lateral to the knee  and just superficial to the IT band, but it is a significant size.  He  does have a moderate knee joint effusion as well.   IMPRESSION:  This is a 35 year old with a right knee abscess and knee  joint effusion.   PLAN:  I do believe that I need to take him emergently to the operating  room for  intervention of opening and draining this abscess, and  following this, I will put arthroscopic cannulas in the knee to drain  the effusion from the knee and run fluid through this as well.  I agree  with him continuing on antibiotics pending what the cultures show as  well.  The risks and benefits of this have been thoroughly explained to  him and understood, and he does agree to proceed with surgery, and so  informed consent will be obtained as well.           ______________________________  [name]. Ninfa Linden, M.D.     CYB/MEDQ  D:  08/19/2006  T:  08/20/2006  Job:  [id number]

## 2011-01-01 NOTE — H&P (Signed)
[name], [name]           ACCOUNT NO.:  [id number]   MEDICAL RECORD NO.:  [id number]          PATIENT TYPE:  INP   LOCATION:  [id number]                         FACILITY:  [facility]   PHYSICIAN:  Sheila Oats, M.D.DATE OF BIRTH:  09-08-1975   DATE OF ADMISSION:  12/27/2004  DATE OF DISCHARGE:                                HISTORY & PHYSICAL   PRIMARY CARE PHYSICIAN:  Unassigned.   CHIEF COMPLAINT:  Cough, fever with nausea and vomiting.   HISTORY OF PRESENT ILLNESS:  The patient is a 35 year old black male with  past medical history significant for diabetes mellitus and tobacco abuse who  presents with the above complaints times three days. He states that his  cough has been productive of blood-streaked sputum and also associated with  pleuritic chest pain. The patient ran out of his insulin three days ago as  well and has had nausea, vomiting, and diarrhea.  His diarrhea has now  resolved. The patient denies shortness of breath, hematemesis, abdominal  pain, hematochezia, or melena.   [name] was seen in the ER and evaluation included a chest x-ray which was  consistent with a pneumonia and also he was noted to have markedly elevated  blood glucose with elevated white cell count as well. He is admitted to the  [facility] hospitalist service for further evaluation and management.   PAST MEDICAL HISTORY:  As stated above.   MEDICATIONS:  Insulin 70/30, 15 units q.a.m. and 20 units q.p.m.   ALLERGIES:  NKDA.   SOCIAL HISTORY:  Positive for tobacco half pack a day. Denies alcohol use.   FAMILY HISTORY:  His mother has diabetes and hypertension. His aunt is  deceased with an MI at age 69.   REVIEW OF SYSTEMS:  As per HPI. Other review of systems negative.   PHYSICAL EXAMINATION:  GENERAL: The patient is a young black male, acutely  ill-appearing, in no respiratory distress.  VITAL SIGNS: Temperature 99.4, blood pressure 141/67, pulse 83, respiratory  rate 20, and O2  saturation of 97%.  HEENT: PERRL. EOMI. Sclerae anicteric. Dry mucous membranes.  NECK: Supple. No adenopathy. No thyromegaly. No JVD.  LUNGS: Right lower lung field had crackles. No wheezes.  CARDIOVASCULAR: Regular rate and rhythm. Normal S1 and S2. No murmurs  appreciated.  ABDOMEN: Soft, bowel sounds present, nontender, nondistended. No  organomegaly and no masses palpated.  EXTREMITIES: No clubbing, cyanosis, or edema.  NEUROLOGIC: Alert and oriented times three. Cranial nerves II-XII grossly  intact. Nonfocal exam.   LABORATORY DATA:  Chest x-ray--right middle lobe/opacity consistent with a  pneumonia. His sodium is 119 with a potassium of 4.9, chloride 90, BUN 30,  glucose 621. His pH 7.35, PCO2 9.2, bicarbonate 22. His hematocrit is 43,  hemoglobin 14.6, white cell count 20.3 and his platelet count 374,000.   ASSESSMENT:  1.  Pneumonia, right middle lobe--will obtain blood cultures, empiric      antibiotics, and antitussis.  2.  Uncontrolled diabetes mellitus/hyperglycemia--secondary to #1 and      noncompliance. Will continue IV insulin per Glucomander and monitor Accu-      Chek. Aggressive hydration  with IV fluids.  3.  Volume depletion/azotemia--hydrate as above and follows.  4.  Tobacco abuse--the patient counseled to quit.      ACV/MEDQ  D:  12/28/2004  T:  12/28/2004  Job:  [id number]

## 2011-01-01 NOTE — H&P (Signed)
[name], [name]           ACCOUNT NO.:  [id number]   MEDICAL RECORD NO.:  [id number]          PATIENT TYPE:  INP   LOCATION:  [id number]                         FACILITY:  [facility]   PHYSICIAN:  Benito Mccreedy, M.D.DATE OF BIRTH:  August 16, 1976   DATE OF ADMISSION:  07/04/2006  DATE OF DISCHARGE:                              HISTORY & PHYSICAL   CHIEF COMPLAINT:  Bilateral lower extremity swelling and polyuria,  polydipsia.   HISTORY OF PRESENT ILLNESS:  The patient is a 35 year old African-  American gentleman with history of insulin-dependent diabetes mellitus  who presented with several days' history of polyuria, polydipsia,  decreased appetite and generalized weakness.  He had not been compliant  to his medications and has been smoking and drinking.  He had some  nausea without vomiting.  He has not had any diarrhea, fevers or chills,  headaches, visual changes.  He has had some dyspnea with cough.  He  admits to having easy fatigability.  He denies any chest pain,  palpitations, PND, orthopnea.  No sputum production.  No abdominal pain.  No dysuria, no frequency of micturition.  In the emergency room the  patient was noted to have severe hyperglycemia, more than 500 mg/dl.  His CO2  was within normal limits and he did not have any anion gap  apparent.  The patient is admitted for further evaluation and management  of hyperglycemic state.   PAST MEDICAL HISTORY:  As noted above.  The patient is also suspected to  have a nephrotic syndrome and has seen Dr. Mercy Moore often __________  previously.  He has also a history of hypertension, obesity and  hypoalbuminemia.   MEDICATIONS:  1. Lantus 20 units q.h.s.  2. Lisinopril 20 mg daily.  3. Labetalol 400 mg b.i.d.   Does not have any known medication allergies.   FAMILY HISTORY:  Positive for diabetes mellitus.  His mother is  essentially on dialysis at this moment.  The patient continues to smoke  cigarettes and drinks  alcohol.  He does not use any illicit drugs.   REVIEW OF SYSTEMS:  As noted above, otherwise unremarkable.   PHYSICAL EXAMINATION:  VITAL SIGNS:  BP 174/96, pulse 81, respirations  20, O2 saturation 100% on oxygen by nasal cannula.  Temperature 97.3  degrees Fahrenheit.  GENERAL:  The patient was not in any acute cardiopulmonary distress.  He  was an obese African-American gentleman lying on the stretcher.  HEENT:  Periorbital swelling.  Head was normocephalic, atraumatic.  Pupils equal, round, and reactive to light.  Extraocular movements  intact.  Oropharynx moist.  NECK:  Short neck.  Neck supple, no JVD.  LUNGS:  Decreased breath sounds at the bases.  CARDIAC:  The patient was not tachycardic.  Rhythm was regular.  No  gallops.  ABDOMEN:  Obese, soft, bowel sounds present.  EXTREMITIES:  No cyanosis.  The patient has edema of his extremities, 4+  on both lower extremities and 2+ in both upper extremities, and he has  edema of his abdominal wall as well as presacral edema noted.  CENTRAL NERVOUS SYSTEM:  The patient is awake, alert and  oriented x3, no  acute focal deficit.   LABORATORY DATA:  Chest x-ray showed bilateral pleural effusions.  WBC  count 11.8, hemoglobin 9.5, hematocrit 28.0, MCV 98.5, platelet count  607.  Sodium 136, potassium less than 2.0, chloride 104, CO2 26, glucose  170 (glucose was 553 mg/dl earlier).  BUN 24, creatinine 3.1.  Bilirubin  0.6, alkaline phosphatase 69, AST 20, ALT 11, total protein 12.3,  albumin less than 1.0, calcium 7.0.  CPK 984, MB 13.2. troponin I 0.05.  Total cholesterol 438, triglycerides 262, HDL 31, LDL 305.  Drug abuse  screen was positive for cocaine.  UA was notable for glucosuria of more  than 1000 mg/dl.  There was moderate blood, proteinuria of more than 300  mg/dl.  Negative nitrite and leukocyte.   IMPRESSION:  1. Hyperosmolar nonketotic hyperglycemic state secondary to      noncompliance with medication regimen.  2.  Anasarca.  3. Acute renal failure.  4. Severe hypokalemia.  5. Dyslipidemia.   The patient is highly suspicious for nephrotic syndrome with  hypoalbuminemia, proteinuria, periorbital edema, anasarca.  The patient  is admitted for treatment of diabetic ketoacidosis with IV fluids and IV  insulin.  We will monitor his renal function and get a renal workup if  renal function does not improve.  We will check his 24-hour urine  collection for proteinuria if this has not been done in the office  previously.  The patient says that he has not seen Dr. Mercy Moore for a  long time.  We will also get a 2 D echocardiogram to evaluate his  cardiac status.  I have counseled the patient regarding use of cocaine  and its abuse and how __________ .  We will refer to ADS on discharge.  We will replete his potassium aggressively and follow up on potassium  level.      Benito Mccreedy, M.D.  Electronically Signed     GO/MEDQ  D:  07/05/2006  T:  07/05/2006  Job:  [id number]

## 2011-01-01 NOTE — Discharge Summary (Signed)
[name], [name]           ACCOUNT NO.:  [id number]   MEDICAL RECORD NO.:  [id number]          PATIENT TYPE:  INP   LOCATION:  [id number]                         FACILITY:  [facility]   PHYSICIAN:  Sheila Oats, M.D.DATE OF BIRTH:  15-Dec-1975   DATE OF ADMISSION:  07/04/2006  DATE OF DISCHARGE:  07/07/2006                               DISCHARGE SUMMARY   DISCHARGE DIAGNOSES:  1. Hyperosmolar nonketotic hyperglycemic state-secondary to      noncompliance with medication.  2. Anasarca, pleural effusion.  3. Acute on chronic renal failure.  4. Hypokalemia, severe.  5. Dyslipidemia.  6. History of medical noncompliance.  7. Malignant hypertension.  8. Hypoalbuminemia.   HISTORY:  The patient is a 35 year old black male with history of  diabetes mellitus who presented with several day history of polyuria,  polydipsia, decreased appetite and generalized weakness.  He admitted  that he had not been compliant with his medications, had been smoking  and drinking.  He had some nausea without vomiting.  He denied diarrhea,  fevers, chills, headaches and no visual changes.  He admitted to a  dyspnea with a cough and also to easy fatigability.  He denied chest  pain, palpitations, PND, orthopnea and no abdominal pain.  Also denied  dysuria.   In the ER, the patient was noted to be severely hyperglycemia with blood  glucose greater than 500.  His CO2 was within normal limits.  No  apparent anion gap.  He was admitted to the [facility]  for further evaluation and management.   PHYSICAL EXAMINATION:  Upon admission, as per Dr. Iona Beard Osei-Bonsu,  revealed a blood pressure of 174/96 with a pulse of 81, respiratory rate  of 20, O2 saturation of 100% on nasal cannula.  Temperature of 97.3.  The pertinent findings on exam were in general he was obese in no acute  respiratory distress.  On his HEENT, he was noted to have periorbital  swelling.  His lungs had decreased breath  sounds at the bases.  His  extremities had +4 edema in his lower extremities and +2 edema in the  upper extremities.  Also presacral edema and abdominal wall edema noted.  The rest of the physical exam reported to be within normal limits.   LABORATORY DATA:  Chest x-ray showed bilateral pleural effusions with  white cell count of 11.8, hemoglobin 9.5, hematocrit of 28, MCV of 98.5,  platelet count of 607.  Sodium of 136, potassium less than 2, chloride  of 104, CO2 26, glucose 170, BUN 24, creatinine 3.1, bilirubin 0.6,  alkaline phosphate 69, AST 20, ALT 11, total protein 12.3, albumin less  than 1, calcium 7.0.  CPK 984, troponin 0.05.  Total cholesterol 438,  triglycerides 262, HDL 31, LDL 305.  Drug screen positive for cocaine  and urinalysis notable for glucosuria greater than 1000, moderate blood,  proteinuria greater than 300, negative nitrite and leukocyte esterase.   HOSPITAL COURSE:  Problem 1:  HYPEROSMOLAR NONKETOTIC HYPERGLYCEMIC:  Upon admission, the patient was placed on IV insulin per Glucomander  protocol.  His blood sugars and electrolytes were monitored and when  he  met the criteria he was switched over to subcutaneous insulin with  Lantus.  His blood sugars were monitored and his Lantus dose adjusted  for blood sugar control.  The patient was tolerating a p.o. diet well  with no nausea or vomiting.  At that time, he left AMA.   Problem 2:  ACUTE ON CHRONIC RENAL FAILURE:  The patient's creatinine on  admission was noted to be 3.1 and during his hospital stay he indicated  that Dr. Moshe Cipro was his nephrologist.  I talked to her regarding  his hospitalization and she indicated that there was no need to repeat a  24-hour urine but just to have the patient follow up with her upon  discharge.  It was noted at the time that the patient had a 24-hour  urine in 2006 with a urine protein of 11.4 grams.  It was also noted  that the patient was suspected of having  nephrotic syndrome per  nephrology records.   Problem 3:  ANASARCA/PLEURAL EFFUSION:  The patient had a 2-D  echocardiogram done while in the hospital and it showed that his overall  left ventricular systolic function was at the lower limits of normal.  Ejection fraction estimated at 50%, the range being 45-55%.  There was  no diagnostic evidence of left ventricular regional wall motion  abnormalities.  The left ventricular wall thickness markedly decreased.  Following cautious hydration required because of his hyperglycemic  state, the IV fluids were decreased to [facility].  Once this was done, the  patient's generalized swelling improved significantly.  It was noted  that the patient had hypoalbuminemia, albumin less than 1 and the  patient was said this was likely secondary to the suspected nephrotic  syndrome.   Problem 4:  HYPERCHOLESTEROLEMIA:  The patient was noted to have a  markedly elevated cholesterol level and his zocor was increased during  his hospital stay.  He was to follow up with his primary care physician.   Problem 5:  HYPOKALEMIA:  The patient required multiple runs of IV  potassium as well as oral potassium during his hospital stay and his  last potassium on recheck was 3.0.  He left against medical advice prior  to this being replaced.  He is to follow up with his primary care  physician.   On July 07, 2006, Mr. [name] left the hospital against  medical advice.  He was to follow up with his primary care physician,  case management had talked with him earlier about following up at  [facility] upon discharge.   CONDITION ON DISCHARGE:  Stable/the patient left AMA.      Sheila Oats, M.D.  Electronically Signed     ACV/MEDQ  D:  08/27/2006  T:  08/27/2006  Job:  [id number]

## 2011-01-01 NOTE — Consult Note (Signed)
[name], [name]           ACCOUNT NO.:  [id number]   MEDICAL RECORD NO.:  [id number]          PATIENT TYPE:  INP   LOCATION:  [id number]                         FACILITY:  [facility]   PHYSICIAN:  Louis Meckel, M.D.DATE OF BIRTH:  13-Jul-1976   DATE OF CONSULTATION:  05/24/2005  DATE OF DISCHARGE:                                   CONSULTATION   REFERRING PHYSICIAN:  Cyril Mourning, D.O.   REASON FOR CONSULTATION:  Nephrotic syndrome and CKD.   HISTORY OF PRESENT ILLNESS:  [name] is a 35 year old black male with type  1 diabetes mellitus and reported nephrotic syndrome.  He has not been  followed regularly by a physician and was taking only insulin at home.  Patient was hospitalized in May of 2006, with pneumonia.  His creatinine  level was 1.5 at that time.  He presented to the emergency department on  May 20, 2005, with decreased level of consciousness with seizure.  Blood  pressure was noted to be 218/116.  Head CT was negative for acute  abnormality but he was felt to have hypertensive encephalopathy plus  diabetic ketoacidosis and has been hospitalized since that time.  He has  experienced very slow recovery in terms of his mental status.  Creatinine  has been 1.8 the last few days.  We are asked to assist with management of  nephrotic syndrome and chronic kidney disease.  Patient is very slow to  answer questions and very slow to process information so a lot of this  information is obtained from the chart.   PAST MEDICAL HISTORY:  1.  Type 1 diabetes mellitus, patient says since age 55.  2.  Hypertension.  3.  Strep pneumonia in May of 2006, status post hospitalization.   MEDICATIONS:  1.  Rocephin.  2.  Lasix 40 mg q.12h.  3.  Lantus insulin.  4.  Labetalol.  5.  Potassium repletion.  6.  Sliding scale insulin.  7.  Lovenox.   ALLERGIES:  No known drug allergies.   SOCIAL HISTORY:  Patient is single but lives with lives with fiance.  He  smokes one  half pack per day of cigarettes.  He denies any alcohol use.  His  drug screen was positive for marijuana but negative for cocaine.  He reports  that he works at [employer].   FAMILY HISTORY:  Mother with diabetes.  Patient denies any family history of  kidney disease.   REVIEW OF SYSTEMS:  Negative for fevers, chills, night sweats, headache,  change in urine output, dysuria, frequency, urinary tract infection, stones,  weakness, nausea, vomiting, diarrhea, constipation or abdominal pain.  Positive for lower extremity edema.  Otherwise, review of systems evaluated  and found to be negative.   PHYSICAL EXAMINATION:  GENERAL APPEARANCE:  Very flat affect.  He is very  slow with speech and slow to process information and to answer questions.  It is unknown what his baseline is in this regard.  VITAL SIGNS:  The patient is afebrile.  Blood pressure presently is in the  140s/70s to 160s/90s, heart rate is 101, respiration is 20, blood  glucose is  between 95 and 222.  HEENT:  Pupils are equal, round and reactive to light.  Extraocular  movements intact.  Moist mucous membranes.  NECK:  There is jugular venous distension.  No carotid bruits.  LUNGS:  There is poor effort and decreased breath sounds at bases at  bilaterally.  CARDIOVASCULAR:  Regular rate and rhythm.  ABDOMEN:  Distended.  Positive bowel sounds, nontender.  No  hepatosplenomegaly.  EXTREMITIES:  There is 2+ pitting edema throughout lower extremities.  NEUROLOGIC:  Very slow to process information, although he says that he  cannot tell and again, I question his baseline mental status.   LABORATORY DATA:  White blood cell count 22,000, hemoglobin 11.1.  Hemoglobin A1c of 16.5.  Potassium 2.8, BUN 10, creatinine 1.8, serum  protein is 4, albumin less than 1, calcium 7.9.  Urinalysis did show  microscopic hematuria at 21 to 50 red blood cells per high powered field.   ASSESSMENT:  A 35 year old black male with diabetes  mellitus, hypertension  both poorly controlled along with chronic kidney disease with GFR of around  60 mL per minute.  1.  Renal:  Patient does appear to have nephrotic syndrome based on his low      albumin level.  He also has an element of chronic kidney disease.  T his      is likely secondary to longstanding diabetes mellitus, but cannot rule      out other etiologies such as possible obesity related FSG.  We are doing      a 24-hour urine to quantify proteinuria.  We need to maximize medical      management of either medical issue and institute an ACE inhibitor.  I am      going to start Enalapril at 10 mg a day.  I will recheck urinalysis to      see if he still has microscopic hematuria.  This could have been      secondary to Foley trauma.  2.  Volume/hypertension:  I think these two are related.  I will increase      diuretics, start an ACE inhibitor to slowly bring blood pressure and      volume down.  In order to compensate for this, I will decrease his      Labetalol slightly.  3.  Anemia:  Will check iron stores.  His hemoglobin may improve with      diuresis.  4.  Elevated white blood cell count of unknown etiology.  Blood cultures      were negative.  Patient is on Rocephin for what appears to be a positive      tracheal aspirate with Staph.   Thank you very much for this consultation.  Will continue to follow with  you.           ______________________________  Louis Meckel, M.D.     KAG/MEDQ  D:  05/24/2005  T:  05/24/2005  Job:  [id number]   cc:   Cyril Mourning, D.O.

## 2011-01-01 NOTE — Discharge Summary (Signed)
[name], [name]           ACCOUNT NO.:  [id number]   MEDICAL RECORD NO.:  [id number]          PATIENT TYPE:  INP   LOCATION:  5504                         FACILITY:  [facility]   PHYSICIAN:  Cyril Mourning, D.O.    DATE OF BIRTH:  1975/10/25   DATE OF ADMISSION:  05/20/2005  DATE OF DISCHARGE:  05/27/2005                                 DISCHARGE SUMMARY   REASON FOR ADMISSION:  Altered mental status.   PRINCIPAL DIAGNOSES:  1.  Hypertensive encephalopathy.  2.  Acute renal failure.   SECONDARY DIAGNOSES:  1.  Poorly controlled diabetes mellitus, type 1, with hemoglobin A1C this      admission of 16.5.  2.  Suspected nephrotic syndrome.  The patient's 24-hour urine protein      revealed 11.4 g of protein in his urine over a 24-hour period.      Nephrology, Dr. Mercy Moore, had seen [name] during his course here and      made final recommendations.  For full details, please refer to his full      consultation note.  3.  Bronchitis, resolved.  4.  Poorly controlled hypertension with adequate control after initiation of      Labetalol and lisinopril.  5.  Hypoalbuminemia.  6.  Obesity.   DISPOSITION:  [name] is being discharged home in a medically stable and  improved condition.   FOLLOW UP:  He does have an appointment with [facility] on Friday, May 28, 2005, and he is instructed to follow up with lab work drawn in one week  through [facility].  These are to be reviewed by his new primary physician  and medications adjusted, specifically Lasix, based on that.  In addition,  he was requested to monitor his weights.  His discharge weight at this time  is 108.7 kg, so he is instructed to follow his daily weights and record  these for his primary physician.   DIET:  He was provided dietary instructions while here in the hospital.  He  is instructed to adhere to a consistent carbohydrate diet and low salt diet.   HISTORY OF PRESENT ILLNESS:  For full details,  please refer to the full  admission H&P by Dr. Edythe Lynn.  However, briefly, [name] is a 34-year-  old African American male with type 1 diabetes, with history of strep  pneumonia earlier in the year.  He was brought into the emergency department  with altered mental status.  He was seen in his normal state around 6 p.m.  the evening prior to admission.  He arrived in the emergency department  later that evening and was deemed to be quite confused and agitated.  His  blood pressure was elevated, and he did experience a seizure during that  time.  In the emergency department, he was determined to be in diabetic  ketoacidosis in acute renal failure, with multiple electrolyte derangements.   HOSPITAL COURSE:  He was admitted to the ICU.  Neurology was consulted for  evaluation as well as pulmonary critical care.  He was placed on the  ventilator but subsequently  weaned off of the vent.  His mental status  slowly improved with better control of his hypertension and correction of  his electrolytes.  He experienced no other seizure activity.  He was  followed on the floor to clinical resolution, with nephrology consultation.  In the management of his hypertension, he underwent 24-hour urine  collections for protein and creatinine clearance.   Because Mr. Arroyos has no primary care physician and has been filling his  insulin needs at [facility] without an overseeing Ipek Westra, it is strongly  urged and recommended that [name] adhere to an outpatient medical care  continuity clinic.  He has been provided with [facility] information.   LABORATORY DATA AT THE TIME OF DISCHARGE:  Sodium 142, potassium 3.7, BUN 8,  creatinine 1.6, glucose 130.  CBGs were within acceptable control during  this hospitalization.   DISCHARGE MEDICATIONS:  His final medical regimen is as follows, of course  to be adjusted at the discretion of his accepting Sherronda Sweigert at [facility].  1.  Lisinopril 20 mg daily.  2.   Lasix 80 mg twice daily for the next five days, with anticipated lab      work one week following discharge.  3.  Labetalol 400 mg twice daily.  4.  Lantus 20 units at night.  5.  NovoLog pen 6 units before meals.      Cyril Mourning, D.O.  Electronically Signed     ESS/MEDQ  D:  05/27/2005  T:  05/27/2005  Job:  [id number]   cc:   Leia Alf, M.D.  Fax: [phone]

## 2011-01-01 NOTE — Op Note (Signed)
NAME:  [name], [name]           ACCOUNT NO.:  [id number]   MEDICAL RECORD NO.:  [id number]          PATIENT TYPE:  AMB   LOCATION:  SDS                          FACILITY:  [place]   PHYSICIAN:  Charles E. Fields, MD  DATE OF BIRTH:  13-Aug-1976   DATE OF PROCEDURE:  08/29/2006  DATE OF DISCHARGE:  08/29/2006                               OPERATIVE REPORT   SURGEON:  Jessy Oto. Fields, MD.   ASSISTANT:  Richardson Dopp, PA-C.   PROCEDURE:  Left brachiocephalic arteriovenous fistula.   PREOPERATIVE DIAGNOSIS:  End-stage renal disease.   POSTOPERATIVE DIAGNOSIS:  End-stage renal disease.   ANESTHESIA:  Local with IV sedation.   OPERATIVE FINDINGS:  A 2.5 to 3-mm left cephalic vein.   OPERATIVE DETAILS:  After obtaining informed consent, the patient was  taken to the operating room.  The patient was placed in supine position  on the operating room table.  After adequate sedation, the patient's  left upper extremity was prepped and draped in the usual sterile  fashion.  Local anesthesia was infiltrated near the antecubital crease.  A transverse incision was made in this location and carried down through  the subcutaneous tissues down to the level of the cephalic vein.  The  vein had a slightly sclerotic outer appearance, but it was patent.  It  was approximately 2.5 to 3 mm in diameter.  It was dissected free  circumferentially and small side branches ligated and divided between  silk ties.  Next, the brachial artery was dissected free in the medial  portion of the incision.  This was controlled proximally and distally  with Vesseloops.  The patient was given 5000 units of intravenous  heparin.  Next, a longitudinal arteriotomy was made.  The distal  cephalic vein was ligated and transected.  It was then swung over to the  level of the brachial artery and marked for orientation.  It was  thoroughly flushed with heparinized saline and gently distended.  It was  clamped proximally  with a fine bulldog clamp.  Next, the vein was sewn  end to vein to side of artery using a running 7-0 Prolene suture.  Just  prior to completion of the anastomosis, it was forward bled, backbled  and thoroughly flushed.  The anastomosis was secured.  Vesseloops were  released.  There was a palpable thrill in the fistula immediately.  Next, hemostasis was obtained.  Subcutaneous tissues were reapproximated  using a running 3-0 Vicryl suture.  The skin was closed with a 4-0  Vicryl subcuticular stitch.  The patient tolerated the procedure well  and there were no complications.  Instrument, sponge and needle count  was correct at the end of the case.  The patient was taken to the  recovery room in stable condition.      Jessy Oto. Fields, MD  Electronically Signed    CEF/MEDQ  D:  08/29/2006  T:  08/30/2006  Job:  [id number]

## 2011-01-01 NOTE — Procedures (Signed)
HISTORY:  This is a 35 year old with history of diabetes who had a seizure.  The patient is having an EEG done to evaluate for seizure activity.   PROCEDURE:  This is a portable EEG.   TECHNICAL DESCRIPTION:  Throughout this EEG, there is no distinct or  sustained posterior dominant rhythm noted. The background activity is  symmetric, mostly compromised of low amplitude delta and occasional lower  theta range activity at 10 to 20 microvolts. Photic stimulation nor  hyperventilation were performed throughout this recording. The patient did  not go to the sleep. Throughout this record, there is no evidence of  electrographic seizures or intraictal discharge activity.   IMPRESSION:  This routine EEG is abnormal secondary to diffuse slowing. The  diffuse slowing is suggestive of a toxic metabolic or primary neuronal  disorder. Clinical correlation is advised.           ______________________________  Shaune Pascal. Estella Husk, M.D.     [id number]  D:  05/21/2005 14:34:29  T:  05/21/2005 21:35:12  Job #:  [id number]

## 2011-01-01 NOTE — Consult Note (Signed)
[name], [name]           ACCOUNT NO.:  [id number]   MEDICAL RECORD NO.:  [id number]          PATIENT TYPE:  INP   LOCATION:  [id number]                         FACILITY:  [facility]   PHYSICIAN:  Darrold Span. Florene Glen, M.D.  DATE OF BIRTH:  06-29-1976   DATE OF CONSULTATION:  DATE OF DISCHARGE:                                 CONSULTATION   NEPHROLOGY CONSULTATION   I was asked by Dr. Maryland Pink to see this 35 year old male with chronic  kidney disease secondary to diabetes mellitus who was admitted with  increased right leg pain and swelling, thought to have a presumptive  diagnosis of cellulitis.  Serum creatinine was 4.2 mg/dL, serum albumin  less than 1 gm/dL.  The patient has known diabetes mellitus,  complications including retinopathy with a serum creatinine of 1.5 mg/dL  in May of 2006, 1.8 mg/dL in October of 2006, 3.1 mg/dL August 04, 2006.  Ultrasound in October of 2006 revealed 13.2 cm right kidney, 12.9  cm left kidney, no hydronephrosis.  ANCA and UPEP were unremarkable.  Patient is admitted for further evaluation of the left leg and we were  asked to see him because of the increased serum creatinine and his  swelling.   PAST MEDICAL HISTORY:  1. Diabetes mellitus type 1.  2. History of retinopathy, not treated.  3. History of anemia.  4. History of severe hypertension with malignant hypertension in the      past.  5. History of cocaine and marijuana usage.  6. History of dyslipidemia.  7. History of obesity.   REVIEW OF SYSTEMS:  Remarkable for significant snoring and excessive  sleepiness.   SOCIAL HISTORY:  Remarkable for prior incarceration for almost a decade.  He is married for the past year.  He does smoke cigarettes.  He does not  consume alcohol beverages or drugs currently, but did previously use  illicit substances, including marijuana and cocaine.   CURRENT MEDICATIONS:  Include amlodipine, labetalol, Lasix, Ancef and  Lovenox.   PHYSICAL  EXAMINATION:  GENERAL:  He is an obese [place] American male.  VITAL SIGNS:  Blood pressure is 121/92.  HEENT:  Short neck and obese neck.  LUNGS:  Decreased breath sounds.  HEART:  Regular rhythm and rate.  ABDOMEN:  Tender right upper quadrant.  It is obese.  No masses.  EXTREMITIES:  Four plus edema bilaterally.  No obvious cellulitis on my  exam currently, although he describes tenderness in the right lateral  aspect of the knee.  NEUROLOGIC:  No obvious focality.   Hemoglobin 8.8 grams, creatinine 4.2, potassium 2.5, white blood count  19,000 on admission.   ASSESSMENT:  1. Massive volume overload secondary to nephrotic syndrome.  Secondary      to diabetic nephropathy.  2. Chronic kidney disease, secondary to diabetes mellitus.  3. Type 1 diabetes mellitus, complications including retinopathy and      nephropathy.  4. Hypokalemia.  5. Anemia.  6. Possible cellulitis.  7. Possible sleep apnea.   PLAN:  1. Vein mapping.  2. We will ask CVTS to evaluate the patient for permanent access  placement.  3. We will go ahead and proceed with catheter placement today if      possible.  4. Start dialysis.  5. The patient probably needs to be evaluated for sleep apnea.      Darrold Span. Florene Glen, M.D.  Electronically Signed     ACP/MEDQ  D:  08/12/2006  T:  08/13/2006  Job:  [id number]   cc:   Annita Brod, M.D.

## 2011-01-19 ENCOUNTER — Other Ambulatory Visit (HOSPITAL_COMMUNITY): Payer: Self-pay | Admitting: Nephrology

## 2011-01-19 DIAGNOSIS — N186 End stage renal disease: Secondary | ICD-10-CM

## 2011-01-28 ENCOUNTER — Other Ambulatory Visit (HOSPITAL_COMMUNITY): Payer: Self-pay

## 2012-02-15 ENCOUNTER — Encounter (HOSPITAL_COMMUNITY): Payer: Self-pay | Admitting: Emergency Medicine

## 2012-02-15 ENCOUNTER — Emergency Department (HOSPITAL_COMMUNITY)
Admission: EM | Admit: 2012-02-15 | Discharge: 2012-02-15 | Disposition: A | Discharge status: Home / Self Care | Payer: No Typology Code available for payment source | Attending: Emergency Medicine | Admitting: Emergency Medicine

## 2012-02-15 ENCOUNTER — Emergency Department (HOSPITAL_COMMUNITY): Payer: No Typology Code available for payment source

## 2012-02-15 DIAGNOSIS — E119 Type 2 diabetes mellitus without complications: Secondary | ICD-10-CM | POA: Insufficient documentation

## 2012-02-15 DIAGNOSIS — S139XXA Sprain of joints and ligaments of unspecified parts of neck, initial encounter: Secondary | ICD-10-CM | POA: Insufficient documentation

## 2012-02-15 DIAGNOSIS — Z794 Long term (current) use of insulin: Secondary | ICD-10-CM | POA: Insufficient documentation

## 2012-02-15 DIAGNOSIS — S161XXA Strain of muscle, fascia and tendon at neck level, initial encounter: Secondary | ICD-10-CM

## 2012-02-15 DIAGNOSIS — S39012A Strain of muscle, fascia and tendon of lower back, initial encounter: Secondary | ICD-10-CM

## 2012-02-15 DIAGNOSIS — M542 Cervicalgia: Secondary | ICD-10-CM | POA: Insufficient documentation

## 2012-02-15 DIAGNOSIS — S335XXA Sprain of ligaments of lumbar spine, initial encounter: Secondary | ICD-10-CM | POA: Insufficient documentation

## 2012-02-15 MED ORDER — OXYCODONE-ACETAMINOPHEN 5-325 MG PO TABS: 1.0000 | ORAL_TABLET | Freq: Four times a day (QID) | ORAL | Status: AC | PRN

## 2012-02-15 MED ORDER — CYCLOBENZAPRINE HCL 10 MG PO TABS: 10.0000 mg | ORAL_TABLET | Freq: Two times a day (BID) | ORAL | Status: AC | PRN

## 2012-02-15 MED ORDER — OXYCODONE-ACETAMINOPHEN 5-325 MG PO TABS
2.0000 | ORAL_TABLET | Freq: Once | ORAL | Status: AC
  Administered 2012-02-15: 2 via ORAL
  Filled 2012-02-15: qty 2

## 2012-02-15 NOTE — ED Notes (Signed)
Pt discharged home with family. Ambulated to discharge independently. A x 4. NAD
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
## 2012-02-15 NOTE — ED Notes (Signed)
Patient transported to CT 

## 2012-04-05 ENCOUNTER — Encounter: Payer: Self-pay | Admitting: Cardiovascular Disease

## 2012-04-22 ENCOUNTER — Encounter (HOSPITAL_COMMUNITY): Payer: Self-pay

## 2012-04-22 ENCOUNTER — Emergency Department (HOSPITAL_COMMUNITY)
Admission: EM | Admit: 2012-04-22 | Discharge: 2012-04-22 | Disposition: A | Discharge status: Home / Self Care | Payer: Medicaid Other | Attending: Emergency Medicine | Admitting: Emergency Medicine

## 2012-04-22 DIAGNOSIS — N186 End stage renal disease: Secondary | ICD-10-CM | POA: Insufficient documentation

## 2012-04-22 DIAGNOSIS — R112 Nausea with vomiting, unspecified: Secondary | ICD-10-CM | POA: Insufficient documentation

## 2012-04-22 DIAGNOSIS — R000 Tachycardia, unspecified: Secondary | ICD-10-CM | POA: Insufficient documentation

## 2012-04-22 DIAGNOSIS — F17200 Nicotine dependence, unspecified, uncomplicated: Secondary | ICD-10-CM | POA: Insufficient documentation

## 2012-04-22 DIAGNOSIS — Z794 Long term (current) use of insulin: Secondary | ICD-10-CM | POA: Insufficient documentation

## 2012-04-22 DIAGNOSIS — E119 Type 2 diabetes mellitus without complications: Secondary | ICD-10-CM | POA: Insufficient documentation

## 2012-04-22 DIAGNOSIS — F1010 Alcohol abuse, uncomplicated: Secondary | ICD-10-CM | POA: Insufficient documentation

## 2012-04-22 LAB — HEPATIC FUNCTION PANEL
ALT: 42 U/L (ref 0–53)
Alkaline Phosphatase: 76 U/L (ref 39–117)
Bilirubin, Direct: 0.2 mg/dL (ref 0.0–0.3)
Indirect Bilirubin: 0.2 mg/dL — ABNORMAL LOW (ref 0.3–0.9)
Total Protein: 9.1 g/dL — ABNORMAL HIGH (ref 6.0–8.3)

## 2012-04-22 LAB — POCT I-STAT, CHEM 8
Calcium, Ion: 0.89 mmol/L — ABNORMAL LOW (ref 1.12–1.23)
Chloride: 97 mEq/L (ref 96–112)
Glucose, Bld: 300 mg/dL — ABNORMAL HIGH (ref 70–99)
HCT: 55 % — ABNORMAL HIGH (ref 39.0–52.0)
TCO2: 26 mmol/L (ref 0–100)

## 2012-04-22 LAB — LIPASE, BLOOD: Lipase: 57 U/L (ref 11–59)

## 2012-04-22 MED ORDER — SODIUM CHLORIDE 0.9 % IV BOLUS (SEPSIS)
500.0000 mL | Freq: Once | INTRAVENOUS | Status: AC
  Administered 2012-04-22: 500 mL via INTRAVENOUS

## 2012-04-22 MED ORDER — ONDANSETRON HCL 4 MG/2ML IJ SOLN
4.0000 mg | Freq: Once | INTRAMUSCULAR | Status: AC
  Administered 2012-04-22: 4 mg via INTRAVENOUS
  Filled 2012-04-22: qty 2

## 2012-04-22 MED ORDER — ONDANSETRON 8 MG PO TBDP: 8.0000 mg | ORAL_TABLET | Freq: Three times a day (TID) | ORAL | Status: AC | PRN
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
## 2012-04-22 NOTE — ED Provider Notes (Signed)
History     CSN: [id number]  Arrival date & time 04/22/12  1103   First MD Initiated Contact with Patient 04/22/12 1247      Chief Complaint  Patient presents with  . Alcohol Intoxication  . Emesis    (Consider location/radiation/quality/duration/timing/severity/associated sxs/prior treatment) Patient is a 36 y.o. male presenting with intoxication and vomiting. The history is provided by the patient.  Alcohol Intoxication This is a new problem. Associated symptoms include abdominal pain. Pertinent negatives include no chest pain, no headaches and no shortness of breath.  Emesis  Associated symptoms include abdominal pain. Pertinent negatives include no diarrhea and no headaches.   patient states he drank heavily last night. He stopped around 12:30 in the morning. He now is vomiting and abdominal pain. He is a dialysis patient was dialyzed yesterday. No fevers. No lightheadedness. No other drug use. No diarrhea.  Past Medical History  Diagnosis Date  . Renal disorder dialysis  . Diabetes mellitus     Past Surgical History  Procedure Date  . Av fistula placement left arm    No family history on file.  History  Substance Use Topics  . Smoking status: Current Everyday Smoker  . Smokeless tobacco: Not on file  . Alcohol Use: Yes      Review of Systems  Constitutional: Positive for fatigue. Negative for activity change and appetite change.  HENT: Negative for neck stiffness.   Eyes: Negative for pain.  Respiratory: Negative for chest tightness and shortness of breath.   Cardiovascular: Negative for chest pain and leg swelling.  Gastrointestinal: Positive for nausea, vomiting and abdominal pain. Negative for diarrhea.  Genitourinary: Negative for flank pain.  Musculoskeletal: Negative for back pain.  Skin: Negative for rash.  Neurological: Negative for weakness, numbness and headaches.  Psychiatric/Behavioral: Negative for behavioral problems.    Allergies  Review  of patient's allergies indicates no known allergies.  Home Medications   Current Outpatient Rx  Name Route Sig Dispense Refill  . CALCIUM ACETATE 667 MG PO CAPS Oral Take 1,334 mg by mouth 3 (three) times daily with meals.    . INSULIN GLARGINE 100 UNIT/ML Lantana SOLN Subcutaneous Inject 26 Units into the skin at bedtime.    Marland Kitchen LANTHANUM CARBONATE 1000 MG PO CHEW Oral Chew 2,000 mg by mouth 2 (two) times daily with a meal.    . RENA-VITE PO TABS Oral Take 1 tablet by mouth at bedtime.    Marland Kitchen ONDANSETRON 8 MG PO TBDP Oral Take 1 tablet (8 mg total) by mouth every 8 (eight) hours as needed for nausea. 10 tablet 0    BP 153/100  Pulse 101  Temp 97.9 F (36.6 C) (Oral)  Resp 22  SpO2 95%  Physical Exam  Nursing note and vitals reviewed. Constitutional: He is oriented to person, place, and time. He appears well-developed and well-nourished.  HENT:  Head: Normocephalic and atraumatic.  Eyes: EOM are normal. Pupils are equal, round, and reactive to light.  Neck: Normal range of motion. Neck supple.  Cardiovascular: Regular rhythm and normal heart sounds.   No murmur heard.      Tachycardia  Pulmonary/Chest: Effort normal and breath sounds normal.  Abdominal: Soft. Bowel sounds are normal. He exhibits no distension and no mass. There is no tenderness. There is no rebound and no guarding.  Musculoskeletal: Normal range of motion. He exhibits no edema.  Neurological: He is alert and oriented to person, place, and time. No cranial nerve deficit.  Skin: Skin is  warm and dry.  Psychiatric: He has a normal mood and affect.    ED Course  Procedures (including critical care time)  Labs Reviewed  POCT I-STAT, CHEM 8 - Abnormal; Notable for the following:    BUN 37 (*)     Creatinine, Ser 13.00 (*)     Glucose, Bld 300 (*)     Calcium, Ion 0.89 (*)     Hemoglobin 18.7 (*)     HCT 55.0 (*)     All other components within normal limits  HEPATIC FUNCTION PANEL - Abnormal; Notable for the  following:    Total Protein 9.1 (*)     Indirect Bilirubin 0.2 (*)     All other components within normal limits  LIPASE, BLOOD   No results found.   1. Nausea and vomiting       MDM   nausea and vomiting after drinking excessive amounts of alcohol. He is a dialysis patient was dialyzed yesterday. His laboratory shows a slightly elevated sugar at 300. He otherwise feels somewhat better after treatment and is tolerating orals. He is giving a fluid bolus for tachycardia. He'll likely be able to be discharged.        Jasper Riling. Alvino Chapel, MD 04/22/12 [phone]

## 2012-04-22 NOTE — ED Provider Notes (Signed)
Pt is resting comfortably, vitals stable.  Has eaten some food with no further vomiting.  Will recheck glucose, likely discharge to home since tolerating PO's.    Saddie Benders. Dorna Mai, MD 04/22/12 [id number]

## 2012-05-15 ENCOUNTER — Encounter: Payer: Self-pay | Admitting: Cardiovascular Disease

## 2012-05-15 DIAGNOSIS — N289 Disorder of kidney and ureter, unspecified: Secondary | ICD-10-CM | POA: Insufficient documentation

## 2012-05-16 ENCOUNTER — Encounter: Payer: Self-pay | Admitting: Cardiovascular Disease

## 2012-05-16 ENCOUNTER — Ambulatory Visit (INDEPENDENT_AMBULATORY_CARE_PROVIDER_SITE_OTHER): Payer: Medicaid Other | Admitting: Cardiovascular Disease

## 2012-05-16 VITALS — BP 108/73 | HR 94 | Wt 203.0 lb

## 2012-05-16 DIAGNOSIS — R079 Chest pain, unspecified: Secondary | ICD-10-CM

## 2012-05-16 DIAGNOSIS — R9431 Abnormal electrocardiogram [ECG] [EKG]: Secondary | ICD-10-CM

## 2012-05-16 DIAGNOSIS — N289 Disorder of kidney and ureter, unspecified: Secondary | ICD-10-CM

## 2012-05-16 DIAGNOSIS — R011 Cardiac murmur, unspecified: Secondary | ICD-10-CM

## 2012-05-16 DIAGNOSIS — I214 Non-ST elevation (NSTEMI) myocardial infarction: Secondary | ICD-10-CM | POA: Insufficient documentation

## 2012-05-16 NOTE — Assessment & Plan Note (Signed)
Continue dialysis Not sure what else is happening to ppt pain during dialysis but will discuss with renal

## 2012-05-16 NOTE — Assessment & Plan Note (Signed)
AV sclerosis murmur  No signficant valve disease echo 2011  Observe

## 2012-05-16 NOTE — Patient Instructions (Signed)
Your physician recommends that you schedule a follow-up appointment in:  [place] has requested that you have en exercise stress myoview. For further information please visit [email or web address]. Please follow instruction sheet, as given.  DX ABN EKG  CHEST PAIN   Your physician recommends that you continue on your current medications as directed. Please refer to the Current Medication list given to you today.

## 2012-05-16 NOTE — Progress Notes (Signed)
Patient ID: [name], male   DOB: 01-09-1976, 36 y.o.   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 Dederding for chest pain.  Patient has been getting pains especially at the end of dialysis.  Sometimes feels like indigestion but no relief with tums.  Tightness in chests lasts minutes.  Never had this before and has been on dialysis since 2007.  History of HTN.  Says he had a stress test 4-5 years ago but nothing recent.  Occasional pain with ambulation but symptoms clearly worse on dialysis days.  No prolonged episodes  No dyspnea palpitatons.  Compliant with meds.  Pain is progressive over the last few weeks.  No history of PUD.  Echo 6/11 normal other than moderate LVH and EF 60-65%  ROS: Denies fever, malais, weight loss, blurry vision, decreased visual acuity, cough, sputum, SOB, hemoptysis, pleuritic pain, palpitaitons, heartburn, abdominal pain, melena, lower extremity edema, claudication, or rash.  All other systems reviewed and negative   General: Affect appropriate Young black male HEENT: normal Neck supple with no adenopathy JVP normal no bruits no thyromegaly Lungs clear with no wheezing and good diaphragmatic motion Heart:  [id number] soft systolic  murmur,rub, gallop or click PMI normal Abdomen: benighn, BS positve, no tenderness, no AAA no bruit.  No HSM or HJR Distal pulses intact with no bruits No edema Neuro non-focal Skin warm and dry No muscular weakness Fistula LUE with good thrill  Medications Current Outpatient Prescriptions  Medication Sig Dispense Refill  . calcium acetate (PHOSLO) 667 MG capsule Take 1,334 mg by mouth 3 (three) times daily with meals.      . insulin glargine (LANTUS) 100 UNIT/ML injection Inject 26 Units into the skin at bedtime.      Marland Kitchen lanthanum (FOSRENOL) 1000 MG chewable tablet Chew 2,000 mg by mouth 2 (two) times daily with a meal.      . multivitamin (RENA-VIT) TABS tablet Take 1 tablet by mouth at bedtime.         Allergies Review of patient's allergies indicates no known allergies.  Family History: No family history on file.  Social History: History   Social History  . Marital Status: Legally Separated    Spouse Name: N/A    Number of Children: N/A  . Years of Education: N/A   Occupational History  . Not on file.   Social History Main Topics  . Smoking status: Current Every Day Smoker  . Smokeless tobacco: Not on file  . Alcohol Use: Yes  . Drug Use: Yes    Special: Marijuana  . Sexually Active:    Other Topics Concern  . Not on file   Social History Narrative  . No narrative on file    Electrocardiogram:  NSR rate 94  Insignificant q's in 2,3 and F  Assessment and Plan

## 2012-05-16 NOTE — Assessment & Plan Note (Signed)
Somewhat atypical features but longstanding DM.  F/U stress myovue

## 2012-05-23 ENCOUNTER — Ambulatory Visit (HOSPITAL_COMMUNITY): Payer: Medicaid Other | Attending: Cardiology | Admitting: Radiology

## 2012-05-23 ENCOUNTER — Ambulatory Visit (HOSPITAL_COMMUNITY): Payer: Medicaid Other | Admitting: Radiology

## 2012-05-23 VITALS — BP 98/68 | HR 77 | Ht 74.0 in | Wt 196.0 lb

## 2012-05-23 VITALS — Ht 74.0 in | Wt 196.0 lb

## 2012-05-23 DIAGNOSIS — R55 Syncope and collapse: Secondary | ICD-10-CM | POA: Insufficient documentation

## 2012-05-23 DIAGNOSIS — E119 Type 2 diabetes mellitus without complications: Secondary | ICD-10-CM | POA: Insufficient documentation

## 2012-05-23 DIAGNOSIS — I10 Essential (primary) hypertension: Secondary | ICD-10-CM | POA: Insufficient documentation

## 2012-05-23 DIAGNOSIS — F17200 Nicotine dependence, unspecified, uncomplicated: Secondary | ICD-10-CM | POA: Insufficient documentation

## 2012-05-23 DIAGNOSIS — R079 Chest pain, unspecified: Secondary | ICD-10-CM

## 2012-05-23 DIAGNOSIS — R61 Generalized hyperhidrosis: Secondary | ICD-10-CM | POA: Insufficient documentation

## 2012-05-23 DIAGNOSIS — R0789 Other chest pain: Secondary | ICD-10-CM | POA: Insufficient documentation

## 2012-05-23 DIAGNOSIS — R000 Tachycardia, unspecified: Secondary | ICD-10-CM | POA: Insufficient documentation

## 2012-05-23 MED ORDER — TECHNETIUM TC 99M SESTAMIBI GENERIC - CARDIOLITE
33.0000 | Freq: Once | INTRAVENOUS | Status: AC | PRN
  Administered 2012-05-23: 33 via INTRAVENOUS

## 2012-05-23 MED ORDER — TECHNETIUM TC 99M SESTAMIBI GENERIC - CARDIOLITE
11.0000 | Freq: Once | INTRAVENOUS | Status: AC | PRN
  Administered 2012-05-23: 11 via INTRAVENOUS

## 2012-05-23 NOTE — Progress Notes (Signed)
[facility] [id number] [place] [address] [phone]  Cardiology Nuclear Med Study  [name] is a 36 y.o. male     MRN : [id number]     DOB: 02/10/1976  Procedure Date: 05/23/2012  Nuclear Med Background Indication for Stress Test:  Evaluation for Ischemia History:  '11 Echo:EF=65% Cardiac Risk Factors: Hypertension, Smoker, IDDM-Type I and Family History-CAD  Symptoms:  Chest Tightness with and without Exertion (last episode of chest discomfort was yesterday), Diaphoresis, Dizziness, Fatigue, Nausea/Vomiting, Near Syncope and Rapid HR   Nuclear Pre-Procedure Caffeine/Decaff Intake:  11:30pm Mountain Dew NPO After: 11:30pm   Lungs:  Diminished, but clear. O2 Sat: 97% on room air. IV 0.9% NS with Angio Cath:  20g  IV Site: R Hand x 1, tolerated well IV Started by:  Irven Baltimore, RN  Chest Size (in):  42 Cup Size: n/a  Height: 6\' 2"  (1.88 m)  Weight:  196 lb (88.905 kg)  BMI:  Body mass index is 25.16 kg/(m^2). Tech Comments:  1/2 dose of Lantus Insulin last night, FBS was 256 at 9:00am, no insulin today. Marijuana last night    Nuclear Med Study 1 or 2 day study: 1 day  Stress Test Type:  Stress  Reading MD: Kirk Ruths, MD  Order Authorizing Provider:  Jenkins Rouge, MD  Resting Radionuclide: Technetium 44m Sestamibi  Resting Radionuclide Dose: 11.0 mCi   Stress Radionuclide:  Technetium 13m Sestamibi  Stress Radionuclide Dose: 33.0 mCi           Stress Protocol Rest HR: 77 Stress HR: 160  Rest BP: 98/68 Stress BP: 211/87  Exercise Time (min): 8:22 METS: 10.1   Predicted Max HR: 185 bpm % Max HR: 86.49 bpm Rate Pressure Product: 33760   Dose of Adenosine (mg):  n/a Dose of Lexiscan: n/a mg  Dose of Atropine (mg): n/a Dose of Dobutamine: n/a mcg/kg/min (at max HR)  Stress Test Technologist: Letta Moynahan, CMA-N  Nuclear Technologist:  Annye Rusk, CNMT     Rest Procedure:  Myocardial  perfusion imaging was performed at rest 45 minutes following the intravenous administration of Technetium 24m Sestamibi.  Rest ECG: Nonspecific ST-T wave changes.  Stress Procedure:  The patient exercised on the treadmill utilizing the Bruce protocol for 8:22 minutes. He then stopped due to fatigue and denied any chest pain.  There were no diagnostic ST-T wave changes.  He did have a hypertensive response to exercise (211/87) and his O2 Sat dropped from 97% to 87% at peak exercise.  Technetium 34m Sestamibi was injected at peak exercise and myocardial perfusion imaging was performed after a brief delay.  Stress ECG: No significant ST segment change suggestive of ischemia.  QPS Raw Data Images:  Acquisition technically good; LVE. Stress Images:  There is decreased uptake in the apex. Rest Images:  There is decreased uptake in the apex. Subtraction (SDS):  No evidence of ischemia. Transient Ischemic Dilatation (Normal <1.22):  1.06 Lung/Heart Ratio (Normal <0.45):  0.30  Quantitative Gated Spect Images QGS EDV:  131 ml QGS ESV:  63 ml  Impression Exercise Capacity:  Fair exercise capacity. BP Response:  Hypertensive blood pressure response. Clinical Symptoms:  There is dyspnea. ECG Impression:  No significant ST segment change suggestive of ischemia. Comparison with Prior Nuclear Study: No previous nuclear study performed  Overall Impression:  Normal stress nuclear study with a small, mild, fixed apical defect consistent with thinning; no ischemia.  LV Ejection Fraction:  52%.  LV Wall Motion:  NL LV Function; NL Wall Motion      Kirk Ruths

## 2012-05-23 NOTE — Progress Notes (Signed)
This encounter was created in error - please disregard.

## 2012-05-25 ENCOUNTER — Other Ambulatory Visit (HOSPITAL_COMMUNITY): Payer: Self-pay | Admitting: Nephrology

## 2012-05-25 DIAGNOSIS — N186 End stage renal disease: Secondary | ICD-10-CM

## 2012-06-08 ENCOUNTER — Ambulatory Visit (HOSPITAL_COMMUNITY)
Admission: RE | Admit: 2012-06-08 | Discharge: 2012-06-08 | Disposition: A | Discharge status: Home / Self Care | Payer: Medicaid Other | Source: Ambulatory Visit | Attending: Nephrology | Admitting: Nephrology

## 2012-06-08 DIAGNOSIS — Y832 Surgical operation with anastomosis, bypass or graft as the cause of abnormal reaction of the patient, or of later complication, without mention of misadventure at the time of the procedure: Secondary | ICD-10-CM | POA: Insufficient documentation

## 2012-06-08 DIAGNOSIS — T82898A Other specified complication of vascular prosthetic devices, implants and grafts, initial encounter: Secondary | ICD-10-CM | POA: Insufficient documentation

## 2012-06-08 DIAGNOSIS — N186 End stage renal disease: Secondary | ICD-10-CM

## 2012-06-08 MED ORDER — IOHEXOL 300 MG/ML  SOLN
100.0000 mL | Freq: Once | INTRAMUSCULAR | Status: AC | PRN
  Administered 2012-06-08: 40 mL via INTRAVENOUS

## 2012-06-08 NOTE — Procedures (Signed)
LUE AV fistulagram Patent No comp

## 2013-06-28 ENCOUNTER — Emergency Department (HOSPITAL_COMMUNITY): Payer: Medicaid Other

## 2013-06-28 ENCOUNTER — Emergency Department (HOSPITAL_COMMUNITY)
Admission: EM | Admit: 2013-06-28 | Discharge: 2013-06-29 | Disposition: A | Discharge status: Home / Self Care | Payer: Medicaid Other | Attending: Emergency Medicine | Admitting: Emergency Medicine

## 2013-06-28 DIAGNOSIS — M109 Gout, unspecified: Secondary | ICD-10-CM | POA: Insufficient documentation

## 2013-06-28 DIAGNOSIS — Z87448 Personal history of other diseases of urinary system: Secondary | ICD-10-CM | POA: Insufficient documentation

## 2013-06-28 DIAGNOSIS — F17200 Nicotine dependence, unspecified, uncomplicated: Secondary | ICD-10-CM | POA: Insufficient documentation

## 2013-06-28 DIAGNOSIS — Z23 Encounter for immunization: Secondary | ICD-10-CM | POA: Insufficient documentation

## 2013-06-28 DIAGNOSIS — Z79899 Other long term (current) drug therapy: Secondary | ICD-10-CM | POA: Insufficient documentation

## 2013-06-28 DIAGNOSIS — Z794 Long term (current) use of insulin: Secondary | ICD-10-CM | POA: Insufficient documentation

## 2013-06-28 DIAGNOSIS — Z792 Long term (current) use of antibiotics: Secondary | ICD-10-CM | POA: Insufficient documentation

## 2013-06-28 DIAGNOSIS — E119 Type 2 diabetes mellitus without complications: Secondary | ICD-10-CM | POA: Insufficient documentation

## 2013-06-28 MED ORDER — OXYCODONE-ACETAMINOPHEN 5-325 MG PO TABS
1.0000 | ORAL_TABLET | Freq: Once | ORAL | Status: AC
  Administered 2013-06-28: 1 via ORAL
  Filled 2013-06-28: qty 1

## 2013-06-28 NOTE — ED Notes (Signed)
Presents with left great toe pain began Tuesday after scraping toe on a nail. Great toe redness and swelling, no warmth. CMS intact. Pain is not controlled by motrin

## 2013-06-29 MED ORDER — TETANUS-DIPHTH-ACELL PERTUSSIS 5-2.5-18.5 LF-MCG/0.5 IM SUSP
0.5000 mL | Freq: Once | INTRAMUSCULAR | Status: AC
  Administered 2013-06-29: 0.5 mL via INTRAMUSCULAR
  Filled 2013-06-29: qty 0.5

## 2013-06-29 MED ORDER — OXYCODONE-ACETAMINOPHEN 5-325 MG PO TABS: 1.0000 | ORAL_TABLET | Freq: Four times a day (QID) | ORAL | Status: DC | PRN

## 2013-06-29 MED ORDER — CEPHALEXIN 500 MG PO CAPS: 500.0000 mg | ORAL_CAPSULE | Freq: Four times a day (QID) | ORAL | Status: DC

## 2013-06-29 MED ORDER — TRAMADOL HCL 50 MG PO TABS
50.0000 mg | ORAL_TABLET | Freq: Once | ORAL | Status: AC
  Administered 2013-06-29: 50 mg via ORAL
  Filled 2013-06-29: qty 1

## 2013-06-29 MED ORDER — CEPHALEXIN 250 MG PO CAPS
500.0000 mg | ORAL_CAPSULE | Freq: Once | ORAL | Status: AC
  Administered 2013-06-29: 500 mg via ORAL
  Filled 2013-06-29: qty 2

## 2013-06-29 NOTE — ED Provider Notes (Signed)
Medical screening examination/treatment/procedure(s) were performed by non-physician practitioner and as supervising physician I was immediately available for consultation/collaboration.    Kalman Drape, MD 06/29/13 0630

## 2013-06-29 NOTE — ED Provider Notes (Signed)
CSN: [id number]     Arrival date & time 06/28/13  2259 History   First MD Initiated Contact with Patient 06/29/13 0006     Chief Complaint  Patient presents with  . Toe Pain   (Consider location/radiation/quality/duration/timing/severity/associated sxs/prior Treatment) HPI Comments: Patient presents today with left great, toe pain.  He said on Tuesday.  He scraped the side of his foot on a nail in the same area.  That he had a healed stasis ulcer.  There was debris to the skin but since that, time.  He's noticed redness, swelling, and difficulty or painful.  Walking while at dialysis yesterday.  His medical team looked at it and suggested at your visit.  Does not have a medical doctor.  He denies any fever, myalgias, pain, radiating past the great, toe  Patient is a 37 y.o. male presenting with toe pain. The history is provided by the patient.  Toe Pain This is a recurrent problem. The current episode started yesterday. The problem occurs constantly. The problem has been unchanged. Pertinent negatives include no fever, numbness or weakness. The symptoms are aggravated by walking. He has tried acetaminophen for the symptoms. The treatment provided no relief.    Past Medical History  Diagnosis Date  . Renal disorder dialysis  . Diabetes mellitus    Past Surgical History  Procedure Laterality Date  . Av fistula placement  left arm   No family history on file. History  Substance Use Topics  . Smoking status: Current Every Day Smoker  . Smokeless tobacco: Not on file  . Alcohol Use: Yes    Review of Systems  Constitutional: Negative for fever.  Skin: Positive for color change. Negative for wound.  Neurological: Negative for weakness and numbness.  All other systems reviewed and are negative.    Allergies  Review of patient's allergies indicates no known allergies.  Home Medications   Current Outpatient Rx  Name  Route  Sig  Dispense  Refill  . calcium acetate (PHOSLO) 667  MG capsule   Oral   Take 1,334 mg by mouth 3 (three) times daily with meals.         . cephALEXin (KEFLEX) 500 MG capsule   Oral   Take 1 capsule (500 mg total) by mouth 4 (four) times daily.   28 capsule   0   . insulin glargine (LANTUS) 100 UNIT/ML injection   Subcutaneous   Inject 26 Units into the skin at bedtime.         Marland Kitchen lanthanum (FOSRENOL) 1000 MG chewable tablet   Oral   Chew 2,000 mg by mouth 2 (two) times daily with a meal.         . multivitamin (RENA-VIT) TABS tablet   Oral   Take 1 tablet by mouth at bedtime.         Marland Kitchen oxyCODONE-acetaminophen (PERCOCET/ROXICET) 5-325 MG per tablet   Oral   Take 1 tablet by mouth every 6 (six) hours as needed for severe pain.   12 tablet   0    BP 149/89  Pulse 95  Temp(Src) 98.4 F (36.9 C) (Oral)  Resp 20  SpO2 98% Physical Exam  Nursing note and vitals reviewed. Constitutional: He appears well-developed and well-nourished.  HENT:  Head: Normocephalic.  Eyes: Pupils are equal, round, and reactive to light.  Neck: Normal range of motion.  Cardiovascular: Normal rate and regular rhythm.   Pulmonary/Chest: Effort normal.  Musculoskeletal: He exhibits tenderness. He exhibits no edema.  Feet:  Erythema but no swelling, no brachium.  The skin, exquisitely sensitive to touch, or movement  Neurological: He is alert.  Skin: Skin is warm. There is erythema.    ED Course  Procedures (including critical care time) Labs Review Labs Reviewed - No data to display Imaging Review Dg Toe Great Left  06/28/2013   CLINICAL DATA:  Left great toe pain.  Hit left great toe on nail.  EXAM: LEFT GREAT TOE  COMPARISON:  Left foot radiographs performed 12/02/2009  FINDINGS: There is no evidence of fracture or dislocation. There is no evidence of osseous erosion to suggest osteomyelitis. The left great toe is unremarkable in appearance. Visualized joint spaces are preserved. Diffuse vascular calcifications are seen.   IMPRESSION: 1. No evidence of fracture or dislocation. No evidence of osseous erosion. 2. Diffuse vascular calcifications seen.   Electronically Signed   By: [name] M.D.   On: 06/28/2013 23:43    EKG Interpretation   None       MDM   1. Gout     Since, blood sugars have not been elevated.  I think this is a first episode of gout.  I will treat with anti-inflammatory and pain control, and have added an antibiotic, as upper caution due to the patient's being a diabetic and have patient followup as needed    [name], NP 06/29/13 0119

## 2013-08-06 DIAGNOSIS — L97509 Non-pressure chronic ulcer of other part of unspecified foot with unspecified severity: Secondary | ICD-10-CM

## 2013-08-07 ENCOUNTER — Emergency Department (HOSPITAL_COMMUNITY): Payer: Medicaid Other

## 2013-08-07 ENCOUNTER — Inpatient Hospital Stay (HOSPITAL_COMMUNITY)
Admission: EM | Admit: 2013-08-07 | Discharge: 2013-08-08 | DRG: 638 | Disposition: A | Discharge status: Home / Self Care | Payer: Medicaid Other | Attending: Family Medicine | Admitting: Family Medicine

## 2013-08-07 ENCOUNTER — Encounter (HOSPITAL_COMMUNITY): Payer: Self-pay | Admitting: Emergency Medicine

## 2013-08-07 DIAGNOSIS — N289 Disorder of kidney and ureter, unspecified: Secondary | ICD-10-CM

## 2013-08-07 DIAGNOSIS — R079 Chest pain, unspecified: Secondary | ICD-10-CM

## 2013-08-07 DIAGNOSIS — D631 Anemia in chronic kidney disease: Secondary | ICD-10-CM | POA: Diagnosis present

## 2013-08-07 DIAGNOSIS — Z79899 Other long term (current) drug therapy: Secondary | ICD-10-CM

## 2013-08-07 DIAGNOSIS — L97509 Non-pressure chronic ulcer of other part of unspecified foot with unspecified severity: Secondary | ICD-10-CM | POA: Diagnosis present

## 2013-08-07 DIAGNOSIS — N186 End stage renal disease: Secondary | ICD-10-CM | POA: Diagnosis present

## 2013-08-07 DIAGNOSIS — M9080 Osteopathy in diseases classified elsewhere, unspecified site: Secondary | ICD-10-CM | POA: Diagnosis present

## 2013-08-07 DIAGNOSIS — E1069 Type 1 diabetes mellitus with other specified complication: Principal | ICD-10-CM | POA: Diagnosis present

## 2013-08-07 DIAGNOSIS — M869 Osteomyelitis, unspecified: Secondary | ICD-10-CM | POA: Diagnosis present

## 2013-08-07 DIAGNOSIS — I120 Hypertensive chronic kidney disease with stage 5 chronic kidney disease or end stage renal disease: Secondary | ICD-10-CM | POA: Diagnosis present

## 2013-08-07 DIAGNOSIS — N2581 Secondary hyperparathyroidism of renal origin: Secondary | ICD-10-CM | POA: Diagnosis present

## 2013-08-07 DIAGNOSIS — Z794 Long term (current) use of insulin: Secondary | ICD-10-CM

## 2013-08-07 DIAGNOSIS — E119 Type 2 diabetes mellitus without complications: Secondary | ICD-10-CM | POA: Diagnosis present

## 2013-08-07 DIAGNOSIS — R011 Cardiac murmur, unspecified: Secondary | ICD-10-CM

## 2013-08-07 DIAGNOSIS — E11621 Type 2 diabetes mellitus with foot ulcer: Secondary | ICD-10-CM | POA: Diagnosis present

## 2013-08-07 DIAGNOSIS — E1169 Type 2 diabetes mellitus with other specified complication: Secondary | ICD-10-CM

## 2013-08-07 DIAGNOSIS — F17200 Nicotine dependence, unspecified, uncomplicated: Secondary | ICD-10-CM | POA: Diagnosis present

## 2013-08-07 DIAGNOSIS — F1210 Cannabis abuse, uncomplicated: Secondary | ICD-10-CM | POA: Diagnosis present

## 2013-08-07 DIAGNOSIS — Z992 Dependence on renal dialysis: Secondary | ICD-10-CM

## 2013-08-07 LAB — CBC
Hemoglobin: 10.6 g/dL — ABNORMAL LOW (ref 13.0–17.0)
MCH: 28.3 pg (ref 26.0–34.0)
MCHC: 33.7 g/dL (ref 30.0–36.0)
Platelets: 272 10*3/uL (ref 150–400)
RBC: 3.74 MIL/uL — ABNORMAL LOW (ref 4.22–5.81)
WBC: 8.6 10*3/uL (ref 4.0–10.5)

## 2013-08-07 LAB — CBC WITH DIFFERENTIAL/PLATELET
Basophils Absolute: 0 10*3/uL (ref 0.0–0.1)
Basophils Relative: 0 % (ref 0–1)
Eosinophils Absolute: 0.2 10*3/uL (ref 0.0–0.7)
HCT: 33.4 % — ABNORMAL LOW (ref 39.0–52.0)
Lymphs Abs: 2.4 10*3/uL (ref 0.7–4.0)
MCH: 28.4 pg (ref 26.0–34.0)
MCHC: 34.1 g/dL (ref 30.0–36.0)
MCV: 83.3 fL (ref 78.0–100.0)
Monocytes Absolute: 0.9 10*3/uL (ref 0.1–1.0)
Neutrophils Relative %: 66 % (ref 43–77)
Platelets: 300 10*3/uL (ref 150–400)
RBC: 4.01 MIL/uL — ABNORMAL LOW (ref 4.22–5.81)

## 2013-08-07 LAB — COMPREHENSIVE METABOLIC PANEL
AST: 10 U/L (ref 0–37)
Albumin: 3 g/dL — ABNORMAL LOW (ref 3.5–5.2)
Alkaline Phosphatase: 47 U/L (ref 39–117)
CO2: 30 mEq/L (ref 19–32)
Calcium: 9 mg/dL (ref 8.4–10.5)
Creatinine, Ser: 13.74 mg/dL — ABNORMAL HIGH (ref 0.50–1.35)
Potassium: 3.5 mEq/L (ref 3.5–5.1)
Sodium: 137 mEq/L (ref 135–145)
Total Protein: 7 g/dL (ref 6.0–8.3)

## 2013-08-07 LAB — POCT I-STAT, CHEM 8
HCT: 36 % — ABNORMAL LOW (ref 39.0–52.0)
Hemoglobin: 12.2 g/dL — ABNORMAL LOW (ref 13.0–17.0)
Potassium: 3.1 mEq/L — ABNORMAL LOW (ref 3.5–5.1)
Sodium: 137 mEq/L (ref 135–145)

## 2013-08-07 LAB — PHOSPHORUS: Phosphorus: 3.5 mg/dL (ref 2.3–4.6)

## 2013-08-07 LAB — GLUCOSE, CAPILLARY
Glucose-Capillary: 95 mg/dL (ref 70–99)
Glucose-Capillary: 99 mg/dL (ref 70–99)

## 2013-08-07 LAB — VANCOMYCIN, RANDOM: Vancomycin Rm: 37.1 ug/mL

## 2013-08-07 LAB — CREATININE, SERUM
Creatinine, Ser: 13.75 mg/dL — ABNORMAL HIGH (ref 0.50–1.35)
GFR calc Af Amer: 5 mL/min — ABNORMAL LOW (ref >90)
GFR calc non Af Amer: 4 mL/min — ABNORMAL LOW (ref >90)

## 2013-08-07 LAB — MRSA PCR SCREENING: MRSA by PCR: POSITIVE — AB

## 2013-08-07 MED ORDER — ALTEPLASE 2 MG IJ SOLR
2.0000 mg | Freq: Once | INTRAMUSCULAR | Status: DC | PRN
  Filled 2013-08-07: qty 2

## 2013-08-07 MED ORDER — MORPHINE SULFATE 2 MG/ML IJ SOLN
INTRAMUSCULAR | Status: AC
  Administered 2013-08-07: 2 mg via INTRAVENOUS_CENTRAL
  Filled 2013-08-07: qty 1

## 2013-08-07 MED ORDER — HYDROCODONE-ACETAMINOPHEN 5-325 MG PO TABS
1.0000 | ORAL_TABLET | ORAL | Status: DC | PRN
  Administered 2013-08-07: 2 via ORAL
  Filled 2013-08-07: qty 2

## 2013-08-07 MED ORDER — CALCIUM ACETATE 667 MG PO CAPS
2001.0000 mg | ORAL_CAPSULE | Freq: Three times a day (TID) | ORAL | Status: DC
  Administered 2013-08-08: 2001 mg via ORAL
  Filled 2013-08-07 (×4): qty 3

## 2013-08-07 MED ORDER — ONDANSETRON HCL 4 MG/2ML IJ SOLN
4.0000 mg | Freq: Three times a day (TID) | INTRAMUSCULAR | Status: DC | PRN
  Administered 2013-08-07: 4 mg via INTRAVENOUS
  Filled 2013-08-07: qty 2

## 2013-08-07 MED ORDER — ONDANSETRON HCL 4 MG/2ML IJ SOLN
4.0000 mg | Freq: Four times a day (QID) | INTRAMUSCULAR | Status: DC | PRN
  Administered 2013-08-08: 4 mg via INTRAVENOUS
  Filled 2013-08-07: qty 2

## 2013-08-07 MED ORDER — INSULIN GLARGINE 100 UNIT/ML ~~LOC~~ SOLN
13.0000 [IU] | Freq: Every day | SUBCUTANEOUS | Status: DC
  Filled 2013-08-07 (×2): qty 0.13

## 2013-08-07 MED ORDER — VANCOMYCIN HCL IN DEXTROSE 1-5 GM/200ML-% IV SOLN: 1000.0000 mg | INTRAVENOUS | Status: DC

## 2013-08-07 MED ORDER — ALTEPLASE 2 MG IJ SOLR: 2.0000 mg | Freq: Once | INTRAMUSCULAR | Status: DC | PRN

## 2013-08-07 MED ORDER — PIPERACILLIN-TAZOBACTAM IN DEX 2-0.25 GM/50ML IV SOLN
2.2500 g | Freq: Three times a day (TID) | INTRAVENOUS | Status: DC
  Administered 2013-08-07 – 2013-08-08 (×3): 2.25 g via INTRAVENOUS
  Filled 2013-08-07 (×6): qty 50

## 2013-08-07 MED ORDER — HYDROCODONE-ACETAMINOPHEN 5-325 MG PO TABS
1.0000 | ORAL_TABLET | Freq: Once | ORAL | Status: AC
  Administered 2013-08-07: 1 via ORAL
  Filled 2013-08-07: qty 1

## 2013-08-07 MED ORDER — PENTAFLUOROPROP-TETRAFLUOROETH EX AERO: 1.0000 "application " | INHALATION_SPRAY | CUTANEOUS | Status: DC | PRN

## 2013-08-07 MED ORDER — LIDOCAINE-PRILOCAINE 2.5-2.5 % EX CREA: 1.0000 "application " | TOPICAL_CREAM | CUTANEOUS | Status: DC | PRN

## 2013-08-07 MED ORDER — DARBEPOETIN ALFA-POLYSORBATE 60 MCG/0.3ML IJ SOLN: 60.0000 ug | INTRAMUSCULAR | Status: DC

## 2013-08-07 MED ORDER — LIDOCAINE HCL (PF) 1 % IJ SOLN
5.0000 mL | INTRAMUSCULAR | Status: DC | PRN
  Filled 2013-08-07: qty 5

## 2013-08-07 MED ORDER — LIDOCAINE HCL (PF) 1 % IJ SOLN: 5.0000 mL | INTRAMUSCULAR | Status: DC | PRN

## 2013-08-07 MED ORDER — AMLODIPINE BESYLATE 5 MG PO TABS
5.0000 mg | ORAL_TABLET | Freq: Every day | ORAL | Status: DC
  Administered 2013-08-07: 5 mg via ORAL
  Filled 2013-08-07 (×2): qty 1

## 2013-08-07 MED ORDER — HEPARIN SODIUM (PORCINE) 1000 UNIT/ML DIALYSIS
1000.0000 [IU] | INTRAMUSCULAR | Status: DC | PRN
  Filled 2013-08-07: qty 1

## 2013-08-07 MED ORDER — POTASSIUM CHLORIDE CRYS ER 20 MEQ PO TBCR
40.0000 meq | EXTENDED_RELEASE_TABLET | Freq: Once | ORAL | Status: AC
  Administered 2013-08-07: 40 meq via ORAL
  Filled 2013-08-07: qty 2

## 2013-08-07 MED ORDER — RENA-VITE PO TABS
1.0000 | ORAL_TABLET | Freq: Every day | ORAL | Status: DC
  Administered 2013-08-07: via ORAL
  Filled 2013-08-07 (×3): qty 1

## 2013-08-07 MED ORDER — AMLODIPINE BESYLATE 5 MG PO TABS: 5.0000 mg | ORAL_TABLET | Freq: Every day | ORAL | Status: DC

## 2013-08-07 MED ORDER — VANCOMYCIN HCL 10 G IV SOLR
2000.0000 mg | Freq: Once | INTRAVENOUS | Status: AC
  Administered 2013-08-07: 2000 mg via INTRAVENOUS
  Filled 2013-08-07: qty 2000

## 2013-08-07 MED ORDER — CALCIUM ACETATE 667 MG PO CAPS: 1334.0000 mg | ORAL_CAPSULE | Freq: Three times a day (TID) | ORAL | Status: DC

## 2013-08-07 MED ORDER — LANTHANUM CARBONATE 500 MG PO CHEW: 2000.0000 mg | CHEWABLE_TABLET | Freq: Two times a day (BID) | ORAL | Status: DC

## 2013-08-07 MED ORDER — HEPARIN SODIUM (PORCINE) 1000 UNIT/ML DIALYSIS
100.0000 [IU]/kg | INTRAMUSCULAR | Status: DC | PRN
  Filled 2013-08-07: qty 10

## 2013-08-07 MED ORDER — SERTRALINE HCL 100 MG PO TABS
100.0000 mg | ORAL_TABLET | Freq: Every day | ORAL | Status: DC
  Administered 2013-08-07: 100 mg via ORAL
  Filled 2013-08-07 (×2): qty 1

## 2013-08-07 MED ORDER — INSULIN ASPART 100 UNIT/ML ~~LOC~~ SOLN: 0.0000 [IU] | Freq: Three times a day (TID) | SUBCUTANEOUS | Status: DC

## 2013-08-07 MED ORDER — MORPHINE SULFATE 2 MG/ML IJ SOLN
2.0000 mg | INTRAMUSCULAR | Status: DC | PRN
  Administered 2013-08-08 (×2): 2 mg via INTRAVENOUS
  Filled 2013-08-07 (×2): qty 1

## 2013-08-07 MED ORDER — SODIUM CHLORIDE 0.9 % IV SOLN: 100.0000 mL | INTRAVENOUS | Status: DC | PRN

## 2013-08-07 MED ORDER — SODIUM CHLORIDE 0.9 % IJ SOLN
3.0000 mL | Freq: Two times a day (BID) | INTRAMUSCULAR | Status: DC
  Administered 2013-08-07 – 2013-08-08 (×2): 3 mL via INTRAVENOUS

## 2013-08-07 MED ORDER — ONDANSETRON HCL 4 MG PO TABS: 4.0000 mg | ORAL_TABLET | Freq: Four times a day (QID) | ORAL | Status: DC | PRN

## 2013-08-07 MED ORDER — NEPRO/CARBSTEADY PO LIQD: 237.0000 mL | ORAL | Status: DC | PRN

## 2013-08-07 MED ORDER — SODIUM CHLORIDE 0.9 % IV SOLN: 250.0000 mL | INTRAVENOUS | Status: DC | PRN

## 2013-08-07 MED ORDER — LIDOCAINE-PRILOCAINE 2.5-2.5 % EX CREA
1.0000 "application " | TOPICAL_CREAM | CUTANEOUS | Status: DC | PRN
  Filled 2013-08-07: qty 5

## 2013-08-07 MED ORDER — HEPARIN SODIUM (PORCINE) 5000 UNIT/ML IJ SOLN
5000.0000 [IU] | Freq: Three times a day (TID) | INTRAMUSCULAR | Status: DC
  Administered 2013-08-07 – 2013-08-08 (×3): 5000 [IU] via SUBCUTANEOUS
  Filled 2013-08-07 (×5): qty 1

## 2013-08-07 MED ORDER — LANTHANUM CARBONATE 500 MG PO CHEW
1000.0000 mg | CHEWABLE_TABLET | Freq: Three times a day (TID) | ORAL | Status: DC
  Administered 2013-08-08: 1000 mg via ORAL
  Filled 2013-08-07 (×5): qty 2

## 2013-08-07 MED ORDER — SODIUM CHLORIDE 0.9 % IJ SOLN: 3.0000 mL | INTRAMUSCULAR | Status: DC | PRN

## 2013-08-07 MED ORDER — NEPRO/CARBSTEADY PO LIQD
237.0000 mL | ORAL | Status: DC | PRN
  Filled 2013-08-07: qty 237

## 2013-08-07 MED ORDER — PIPERACILLIN-TAZOBACTAM IN DEX 2-0.25 GM/50ML IV SOLN
2.2500 g | Freq: Once | INTRAVENOUS | Status: AC
  Administered 2013-08-07: 2.25 g via INTRAVENOUS
  Filled 2013-08-07: qty 50

## 2013-08-07 MED ORDER — HEPARIN SODIUM (PORCINE) 1000 UNIT/ML DIALYSIS: 1000.0000 [IU] | INTRAMUSCULAR | Status: DC | PRN

## 2013-08-07 MED ORDER — DOXERCALCIFEROL 4 MCG/2ML IV SOLN: 4.0000 ug | INTRAVENOUS | Status: DC

## 2013-08-07 MED ORDER — HYDROMORPHONE HCL PF 1 MG/ML IJ SOLN
1.0000 mg | INTRAMUSCULAR | Status: DC | PRN
  Administered 2013-08-07 (×2): 1 mg via INTRAVENOUS
  Filled 2013-08-07 (×2): qty 1

## 2013-08-07 MED ORDER — CLINDAMYCIN HCL 300 MG PO CAPS
300.0000 mg | ORAL_CAPSULE | Freq: Once | ORAL | Status: AC
  Administered 2013-08-07: 300 mg via ORAL
  Filled 2013-08-07: qty 1

## 2013-08-07 MED ORDER — MORPHINE SULFATE 4 MG/ML IJ SOLN
4.0000 mg | Freq: Once | INTRAMUSCULAR | Status: AC
  Administered 2013-08-07: 4 mg via INTRAVENOUS
  Filled 2013-08-07: qty 1

## 2013-08-07 MED ORDER — RENA-VITE PO TABS: 1.0000 | ORAL_TABLET | Freq: Every day | ORAL | Status: DC

## 2013-08-07 MED ORDER — TEMAZEPAM 15 MG PO CAPS: 15.0000 mg | ORAL_CAPSULE | Freq: Every evening | ORAL | Status: DC | PRN

## 2013-08-07 NOTE — Progress Notes (Addendum)
ANTIBIOTIC CONSULT NOTE - INITIAL  Pharmacy Consult for Vancomycin + Zosyn  Indication: L-great toe cellulitis, r/o osteo  No Known Allergies  Patient Measurements: Weight: 200 lb 9.9 oz (91 kg)  Vital Signs: Temp: 97.5 F (36.4 C) (12/23 [id number]) Temp src: Oral (12/23 [id number]) BP: 171/91 mmHg (12/23 [id number]) Pulse Rate: 86 (12/23 0632) Intake/Output from previous day:   Intake/Output from this shift:    Labs:  Recent Labs  08/07/13 0324 08/07/13 0330  WBC 10.4  --   HGB 11.4* 12.2*  PLT 300  --   CREATININE  --  12.90*   The CrCl is unknown because both a height and weight (above a minimum accepted value) are required for this calculation. No results found for this basename: VANCOTROUGH, Corlis Leak, VANCORANDOM, Raymond, GENTPEAK, GENTRANDOM, TOBRATROUGH, TOBRAPEAK, TOBRARND, AMIKACINPEAK, AMIKACINTROU, AMIKACIN,  in the last 72 hours   Microbiology: Recent Results (from the past 720 hour(s))  MRSA PCR SCREENING     Status: Abnormal   Collection Time    08/07/13  6:45 AM      Result Value Range Status   MRSA by PCR POSITIVE (*) NEGATIVE Final   Comment:            The GeneXpert MRSA Assay (FDA     approved for NASAL specimens     only), is one component of a     comprehensive MRSA colonization     surveillance program. It is not     intended to diagnose MRSA     infection nor to guide or     monitor treatment for     MRSA infections.     RESULT CALLED TO, READ BACK BY AND VERIFIED WITH:     Salley Slaughter 08/07/13 0821 BY K SCHULTZ    Medical History: Past Medical History  Diagnosis Date  . Renal disorder dialysis  . Diabetes mellitus     Assessment: 37 y.o. M with ESRD (MWF) who presented with L-toe pain and was found via imaging to have possible osteomyelitis. Pharmacy was consulted to start Vancomycin + Zosyn empiric coverage.   The patient was given Vancomycin + Tressie Ellis as an outpatient in HD on Sun, 12/21 -- doses unknown. The patient also received  Vancomycin 2g IV x 1 dose in the [facility] this morning. The patient is noted to be scheduled to receive HD today via the holiday schedule. Will obtain a pre-HD Vancomycin level to determine when to schedule the patient's next Vancomycin dose (since Vancomycin received both outpatient and in the [facility]).   Goal of Therapy:  Pre-HD Vancomycin level of 15-25 mcg/ml  Plan:  1. Obtain pre-HD Vancomycin level prior to HD session today (12/23) 2. Zosyn 2.25g IV every 8 hours 3. Will continue to follow HD schedule/duration, culture results, LOT, and antibiotic de-escalation plans   Alycia Rossetti, PharmD, BCPS Clinical Pharmacist Pager: [phone] 08/07/2013 2:51 PM    Addendum: Pre-HD Vancomycin level is supratherapeutic at 37.1 (goal 15-25). Will not need any supplemental doses tonight. After today, next HD session will be Friday (back on normal schedule MWF) so will schedule vancomycin 1g IV qHD starting Friday 12/26.  Nena Jordan, PharmD, BCPS 08/07/2013, 4:52 PM

## 2013-08-07 NOTE — H&P (Signed)
[facility] History and Physical  [name] [id number] DOB: [birth year] DOA: 08/07/2013  Referring physician: Dr. Reather Converse PCP: Placido Sou, MD  Specialists: I have consulted nephrology for management of end-stage renal disease  Chief Complaint: Pain at left toe  HPI: [name] Leonguerrero Sr. is a 37 y.o. male  History of diabetes mellitus on insulin, diabetic foot ulcers, end-stage renal disease on hemodialysis on Monday Wednesday Friday. Presenting to the ED complaining of pain at left toe. States the problem has been getting worse within the last week and a half. He states he has had this problem for more than 2 months. The problem reportedly happened when he scraped his total on the nail. Within the last week he is noticed a foul smell and worsening in pain. As a result he was evaluated by a physician to prescribe the medication he could not afford and as a result he was unable to take antibiotic. Per my discussion with the nephrologist patient was given IV antibiotics on dialysis this last Sunday or Monday prior to presentation.  X-ray in the ED was obtained which reported a vague lucency at the lateral base of the distal phalanx suspicious for osteomyelitis. We were consult at given the persistent discomfort with suspicion for osteomyelitis of the left great toe.   Review of Systems:  Constitutional:  No weight loss, night sweats, Fevers, chills, fatigue.  HEENT:  No headaches, Difficulty swallowing,Tooth/dental problems,Sore throat,  No sneezing, itching, ear ache, nasal congestion, post nasal drip,  Cardio-vascular:  No chest pain, Orthopnea, PND, swelling in lower extremities, anasarca, dizziness, palpitations  GI:  No heartburn, indigestion, abdominal pain, nausea, vomiting, diarrhea, change in bowel habits, loss of appetite  Resp:  No shortness of breath with exertion or at rest. No excess mucus, no productive cough, No non-productive cough, No  coughing up of blood.No change in color of mucus.No wheezing.No chest wall deformity  Skin:  Ulcer at left big toe associated with foul smell GU:  no dysuria, change in color of urine, no urgency or frequency. No flank pain.  Musculoskeletal:  No joint pain or swelling. No decreased range of motion. No back pain.  Psych:  No change in mood or affect. No depression or anxiety. No memory loss.   Past Medical History  Diagnosis Date  . Renal disorder dialysis  . Diabetes mellitus    Past Surgical History  Procedure Laterality Date  . Av fistula placement  left arm   Social History:  reports that he has been smoking.  He does not have any smokeless tobacco history on file. He reports that he drinks alcohol. He reports that he uses illicit drugs (Marijuana).  No Known Allergies  History reviewed. No pertinent family history.   Prior to Admission medications   Medication Sig Start Date End Date Taking? Authorizing Provider  calcium acetate (PHOSLO) 667 MG capsule Take 1,334 mg by mouth 3 (three) times daily with meals.   Yes Historical Provider, MD  insulin glargine (LANTUS) 100 UNIT/ML injection Inject 26 Units into the skin at bedtime.   Yes Historical Provider, MD  lanthanum (FOSRENOL) 1000 MG chewable tablet Chew 2,000 mg by mouth 2 (two) times daily with a meal.   Yes Historical Provider, MD  multivitamin (RENA-VIT) TABS tablet Take 1 tablet by mouth at bedtime.   Yes Historical Provider, MD  temazepam (RESTORIL) 15 MG capsule Take 15 mg by mouth at bedtime as needed for sleep.   Yes Historical Provider, MD  Physical Exam: Filed Vitals:   08/07/13 0632  BP: 171/91  Pulse: 86  Temp: 97.5 F (36.4 C)  Resp:     BP 171/91  Pulse 86  Temp(Src) 97.5 F (36.4 C) (Oral)  Resp 22  Wt 91 kg (200 lb 9.9 oz)  SpO2 97%  General:  Appears calm and comfortable Eyes: PERRL, normal lids, irises & conjunctiva ENT: grossly normal hearing, lips & tongue Neck: no LAD, masses or  thyromegaly Cardiovascular: RRR, no m/r/g. No LE edema. Telemetry: SR, no arrhythmias  Respiratory: CTA bilaterally, no w/r/r. Normal respiratory effort. Abdomen: soft, ntnd Skin: no rash or induration seen on limited exam Musculoskeletal: grossly normal tone BUE/BLE Psychiatric: grossly normal mood and affect, speech fluent and appropriate Neurologic: grossly non-focal.          Labs on Admission:  Basic Metabolic Panel:  Recent Labs Lab 08/07/13 0330  NA 137  K 3.1*  CL 92*  GLUCOSE 138*  BUN 23  CREATININE 12.90*   Liver Function Tests: No results found for this basename: AST, ALT, ALKPHOS, BILITOT, PROT, ALBUMIN,  in the last 168 hours No results found for this basename: LIPASE, AMYLASE,  in the last 168 hours No results found for this basename: AMMONIA,  in the last 168 hours CBC:  Recent Labs Lab 08/07/13 0324 08/07/13 0330  WBC 10.4  --   NEUTROABS 6.9  --   HGB 11.4* 12.2*  HCT 33.4* 36.0*  MCV 83.3  --   PLT 300  --    Cardiac Enzymes: No results found for this basename: CKTOTAL, CKMB, CKMBINDEX, TROPONINI,  in the last 168 hours  BNP (last 3 results) No results found for this basename: PROBNP,  in the last 8760 hours CBG:  Recent Labs Lab 08/07/13 1046  [place]    Radiological Exams on Admission: Dg Toe Great Left  08/07/2013   CLINICAL DATA:  Left great toe pain, with wound infection.  EXAM: LEFT GREAT TOE  COMPARISON:  Left great toe radiographs performed 06/28/2013  FINDINGS: Vague lucency is noted at the lateral base of the first distal phalanx, raising concern for osteomyelitis, though this is difficult to fully assess on radiograph. Associated soft tissue swelling is noted.  Visualized joint spaces are grossly preserved. Diffuse vascular calcifications are seen. A bipartite medial sesamoid of the first toe is incidentally seen. An os naviculare is noted.  IMPRESSION: 1. Vague lucency at the lateral base of the first distal phalanx raises  question for osteomyelitis, though this is difficult to fully assess on radiograph. 2. Bipartite medial sesamoid of the first toe; os naviculare seen. 3. Diffuse vascular calcifications noted.   Electronically Signed   By: [name] M.D.   On: 08/07/2013 04:47    Assessment/Plan Active Problems:   Diabetic foot ulcer with osteomyelitis Diabetes End-stage renal disease on dialysis  1. Diabetic foot ulcer with osteomyelitis - Will cover with broad-spectrum antibiotics IV vancomycin and Zosyn - Wound culture - Wound care consult - MRI of foot to assess extent and or confirm osseous infection - Pending results of MRI if osteomyelitis involved, plans would be to consult orthopedic surgeon - N.p.o. after midnight in case MRI confirms osteomyelitis and patient may require surgical intervention  2. Diabetes - Diabetic diet - Will half dose of home Lantus regimen - Sliding scale insulin - Routine blood sugar monitoring  3. End stage renal disease on dialysis - I have consult at nephrology for further evaluation and recommendations from their standpoint. -  Would like to thank nephrology for their help in this case  Code Status: Full Family Communication: no family present, discussed directly with patient Disposition Plan: Pending results of imaging study, MedSurg with broad-spectrum antibiotics at this juncture  Time spent: More than 60 minutes  Velvet Bathe [facility] Pager [phone]

## 2013-08-07 NOTE — ED Provider Notes (Signed)
Medical screening examination/treatment/procedure(s) were performed by non-physician practitioner and as supervising physician I was immediately available for consultation/collaboration.  EKG Interpretation    Date/Time:    Ventricular Rate:    PR Interval:    QRS Duration:   QT Interval:    QTC Calculation:   R Axis:     Text Interpretation:                Mariea Clonts, MD 08/07/13 [phone]

## 2013-08-07 NOTE — ED Provider Notes (Signed)
CSN: [id number]     Arrival date & time 08/07/13  0050 History   First MD Initiated Contact with Patient 08/07/13 0207     Chief Complaint  Patient presents with  . Toe Pain  . Wound Infection   (Consider location/radiation/quality/duration/timing/severity/associated sxs/prior Treatment) HPI  37 year old male with history of diabetes, renal disorder presents complaining of left toe pain.  Patient reports ongoing pain for the past 2 months.  Pain is getting progressively worse in the past 5-7 days.  Pain is achy, throbbing, persistent with strong odor and skin peeling.  Toe is warm to the touch.  No fever, joint pain.  Has been seen in ER a month ago, thought it was gout.  Was seen by pediatrician several weeks ago, was prescribed abx but sts the cost was too high for him to afford.  Is a M/W/F dialysis.  Has dialysis schedule for tomorrow.  Has taking tylenol at home with minimal relief.  Report pain first started when he accidentally scrap toe against a nail.    Past Medical History  Diagnosis Date  . Renal disorder dialysis  . Diabetes mellitus    Past Surgical History  Procedure Laterality Date  . Av fistula placement  left arm   History reviewed. No pertinent family history. History  Substance Use Topics  . Smoking status: Current Every Day Smoker  . Smokeless tobacco: Not on file  . Alcohol Use: Yes    Review of Systems  Constitutional: Negative for fever.  Musculoskeletal: Positive for joint swelling and myalgias.  Skin: Positive for wound.  Neurological: Negative for numbness.    Allergies  Review of patient's allergies indicates no known allergies.  Home Medications   Current Outpatient Rx  Name  Route  Sig  Dispense  Refill  . calcium acetate (PHOSLO) 667 MG capsule   Oral   Take 1,334 mg by mouth 3 (three) times daily with meals.         . insulin glargine (LANTUS) 100 UNIT/ML injection   Subcutaneous   Inject 26 Units into the skin at bedtime.         Marland Kitchen lanthanum (FOSRENOL) 1000 MG chewable tablet   Oral   Chew 2,000 mg by mouth 2 (two) times daily with a meal.         . multivitamin (RENA-VIT) TABS tablet   Oral   Take 1 tablet by mouth at bedtime.         . temazepam (RESTORIL) 15 MG capsule   Oral   Take 15 mg by mouth at bedtime as needed for sleep.          BP 184/146  Pulse 102  Temp(Src) 97.9 F (36.6 C) (Oral)  Resp 22  Wt 200 lb 9.9 oz (91 kg)  SpO2 97% Physical Exam  Constitutional: He appears well-developed and well-nourished. No distress.  HENT:  Head: Atraumatic.  Eyes: Conjunctivae are normal.  Neck: Normal range of motion. Neck supple.  Musculoskeletal: He exhibits tenderness (L great toe is edematous, with skin desquamation and erosion to pad of toe, malodorous, tender to palpation.  ).  Neurological: He is alert.  Skin: No rash noted.  Psychiatric: He has a normal mood and affect.    ED Course  Procedures (including critical care time)  3:37 AM L great toe with evidence of diabetic foot ulcer, concerning for osteomyelitis due to malodor, however no bone visualized on initial exam.  Pedal pulses palpable but faint.  Care discussed with attending.  5:27 AM Clindamycin given for treatment of infection.  Xray of L great toe with some evidence to suggest osteomyelitis.    5:41 AM Since pt fail outpt treatment for his diabetic foot ulcer now with evidence suggestive of osteomyelitis, i have consulted with [facility], Dr. Marin Comment, who agrees to admit pt to team 10, med surg for further management of his condition.  Will start vanc/zosyn pharmacy to dose.  Pt may need surgical washout of the bone.  Pt agrees with plan.     Labs Review Labs Reviewed  CBC WITH DIFFERENTIAL - Abnormal; Notable for the following:    RBC 4.01 (*)    Hemoglobin 11.4 (*)    HCT 33.4 (*)    All other components within normal limits  POCT I-STAT, CHEM 8 - Abnormal; Notable for the following:    Potassium 3.1 (*)     Chloride 92 (*)    Creatinine, Ser 12.90 (*)    Glucose, Bld 138 (*)    Hemoglobin 12.2 (*)    HCT 36.0 (*)    All other components within normal limits   Imaging Review Dg Toe Great Left  08/07/2013   CLINICAL DATA:  Left great toe pain, with wound infection.  EXAM: LEFT GREAT TOE  COMPARISON:  Left great toe radiographs performed 06/28/2013  FINDINGS: Vague lucency is noted at the lateral base of the first distal phalanx, raising concern for osteomyelitis, though this is difficult to fully assess on radiograph. Associated soft tissue swelling is noted.  Visualized joint spaces are grossly preserved. Diffuse vascular calcifications are seen. A bipartite medial sesamoid of the first toe is incidentally seen. An os naviculare is noted.  IMPRESSION: 1. Vague lucency at the lateral base of the first distal phalanx raises question for osteomyelitis, though this is difficult to fully assess on radiograph. 2. Bipartite medial sesamoid of the first toe; os naviculare seen. 3. Diffuse vascular calcifications noted.   Electronically Signed   By: [name] M.D.   On: 08/07/2013 04:47    EKG Interpretation   None       MDM   1. Diabetic foot ulcer with osteomyelitis   2. Renal disorder    BP 154/69  Pulse 79  Temp(Src) 97.9 F (36.6 C) (Oral)  Resp 22  Wt 200 lb 9.9 oz (91 kg)  SpO2 99%  I have reviewed nursing notes and vital signs. I personally reviewed the imaging tests through PACS system  I reviewed available ER/hospitalization records thought the EMR     Domenic Moras, [place] 08/07/13 [id number]

## 2013-08-07 NOTE — ED Notes (Addendum)
Presents by EMS for L big toe pain redness, skin not intact, wound, warm to touch. Sx ongoing for ~ 2 months. Worse tonight. Seen here for same in the past. Seen by podiatrist for the same. Treating as an infection/ for infection. Has not filled abx or taken home abx. They gave me some abx during HD. Also followed at HD for sx. Here for pain  & pain control. HD is MWF, but has HD tomorrow d/t holidays. Mentions recent dx of gout. cbg for EMS PTA 142. LS CTA. VSS BP 118/90, HR 70. Last had tylenol ES at 2100.

## 2013-08-07 NOTE — Consult Note (Signed)
Reason for Consult:ESRD, HTN, Anemia, HPTH Referring Physician: Dr. Marcelline Deist Sr. is an 37 y.o. male.  HPI: 37 yr male with ESRD from Type 1 DM.  FH DM, mother and sister.  Hx HTN, anemia, HPTH, substance abuse, smoking.  Has been seeing Pod for several weeks for foot ulcer L great toe.  Given Rx for AB but did not fill due to cost.  We found this out on Sun and gave Vanc/Fortaz.  He had pain, given Tramadol.  Ongoing pain so came to ER last pm.  GivenVanc and Cleocin.  Has also had D since this started. Poor appetite since this started 2 wk ago.  Chills yesterday, and last pm Constitutional: as above Eyes: poor vision esp at night, but has not seen Opthal Respiratory: slight cough, smoking cig and marij Cardiovascular: negative Gastrointestinal: as above Integument/breast: negative Hematologic/lymphatic: usually ^ Hb now falling Musculoskeletal:as above, toe and foot pain on L Neurological: numbness in feet Endocrine: bs variable, 90s this am   Dialyzes at [facility] on MWF since 2008. Primary Nephrologist Genesis Paget. EDW 91 kg. HD Bath 2.25 Ca/@K , Dialyzer 200NR, Heparin 8000. Access LUA aVF.  Past Medical History  Diagnosis Date  . Renal disorder dialysis  . Diabetes mellitus     Past Surgical History  Procedure Laterality Date  . Av fistula placement  left arm    History reviewed. No pertinent family history.  Social History:  reports that he has been smoking.  He does not have any smokeless tobacco history on file. He reports that he drinks alcohol. He reports that he uses illicit drugs (Marijuana).  Allergies: No Known Allergies  Medications:  I have reviewed the patient's current medications. Prior to Admission:  Prescriptions prior to admission  Medication Sig Dispense Refill  . calcium acetate (PHOSLO) 667 MG capsule Take 1,334 mg by mouth 3 (three) times daily with meals.      . insulin glargine (LANTUS) 100 UNIT/ML injection Inject 26 Units into the  skin at bedtime.      Marland Kitchen lanthanum (FOSRENOL) 1000 MG chewable tablet Chew 2,000 mg by mouth 2 (two) times daily with a meal.      . multivitamin (RENA-VIT) TABS tablet Take 1 tablet by mouth at bedtime.      . temazepam (RESTORIL) 15 MG capsule Take 15 mg by mouth at bedtime as needed for sleep.       , Hectorol 73mcg,. Epogen off, . , Venofer off  Results for orders placed during the hospital encounter of 08/07/13 (from the past 48 hour(s))  CBC WITH DIFFERENTIAL     Status: Abnormal   Collection Time    08/07/13  3:24 AM      Result Value Range   WBC 10.4  4.0 - 10.5 K/uL   RBC 4.01 (*) 4.22 - 5.81 MIL/uL   Hemoglobin 11.4 (*) 13.0 - 17.0 g/dL   HCT 33.4 (*) 39.0 - 52.0 %   MCV 83.3  78.0 - 100.0 fL   MCH 28.4  26.0 - 34.0 pg   MCHC 34.1  30.0 - 36.0 g/dL   RDW 13.6  11.5 - 15.5 %   Platelets 300  150 - 400 K/uL   Neutrophils Relative % 66  43 - 77 %   Neutro Abs 6.9  1.7 - 7.7 K/uL   Lymphocytes Relative 23  12 - 46 %   Lymphs Abs 2.4  0.7 - 4.0 K/uL   Monocytes Relative 9  3 -  12 %   Monocytes Absolute 0.9  0.1 - 1.0 K/uL   Eosinophils Relative 2  0 - 5 %   Eosinophils Absolute 0.2  0.0 - 0.7 K/uL   Basophils Relative 0  0 - 1 %   Basophils Absolute 0.0  0.0 - 0.1 K/uL  POCT I-STAT, CHEM 8     Status: Abnormal   Collection Time    08/07/13  3:30 AM      Result Value Range   Sodium 137  135 - 145 mEq/L   Potassium 3.1 (*) 3.5 - 5.1 mEq/L   Chloride 92 (*) 96 - 112 mEq/L   BUN 23  6 - 23 mg/dL   Creatinine, Ser 12.90 (*) 0.50 - 1.35 mg/dL   Glucose, Bld 138 (*) 70 - 99 mg/dL   Calcium, Ion 1.13  1.12 - 1.23 mmol/L   TCO2 31  0 - 100 mmol/L   Hemoglobin 12.2 (*) 13.0 - 17.0 g/dL   HCT 36.0 (*) 39.0 - 52.0 %  MRSA PCR SCREENING     Status: Abnormal   Collection Time    08/07/13  6:45 AM      Result Value Range   MRSA by PCR POSITIVE (*) NEGATIVE   Comment:            The GeneXpert MRSA Assay (FDA     approved for NASAL specimens     only), is one component of a      comprehensive MRSA colonization     surveillance program. It is not     intended to diagnose MRSA     infection nor to guide or     monitor treatment for     MRSA infections.     RESULT CALLED TO, READ BACK BY AND VERIFIED WITH:     Salley Slaughter 08/07/13 0821 BY K SCHULTZ  GLUCOSE, CAPILLARY     Status: None   Collection Time    08/07/13 10:46 AM      Result Value Range   Glucose-Capillary 95  70 - 99 mg/dL    Dg Toe Great Left  08/07/2013   CLINICAL DATA:  Left great toe pain, with wound infection.  EXAM: LEFT GREAT TOE  COMPARISON:  Left great toe radiographs performed 06/28/2013  FINDINGS: Vague lucency is noted at the lateral base of the first distal phalanx, raising concern for osteomyelitis, though this is difficult to fully assess on radiograph. Associated soft tissue swelling is noted.  Visualized joint spaces are grossly preserved. Diffuse vascular calcifications are seen. A bipartite medial sesamoid of the first toe is incidentally seen. An os naviculare is noted.  IMPRESSION: 1. Vague lucency at the lateral base of the first distal phalanx raises question for osteomyelitis, though this is difficult to fully assess on radiograph. 2. Bipartite medial sesamoid of the first toe; os naviculare seen. 3. Diffuse vascular calcifications noted.   Electronically Signed   By: [name] M.D.   On: 08/07/2013 04:47    ROS Blood pressure 171/91, pulse 86, temperature 97.5 F (36.4 C), temperature source Oral, resp. rate 22, weight 91 kg (200 lb 9.9 oz), SpO2 97.00%. 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 Ulcer at base on plantar side, waem to ankle Extremities - no DP on L , tr on R Skin - dry  Assessment/Plan: 1 Toe ulcer with infx,  ??osteo, xs Vanco now.  Will need x-ray or foot and MRI, may need surgical intervention.  Needs assess of blood flow 2 ESRD: will get HD today.  Use 4 k 3 Hypertension: not an issue 4. Anemia of ESRD: will start epo, fe ok 5. Metabolic Bone Disease: HPTH use Vit D 6. DM willneed to monitor 7 Substance abuse 8 Smoking aggravates P HD, dopplers, AB, MRI,   Berdena Cisek L 08/07/2013, 1:36 PM

## 2013-08-07 NOTE — ED Notes (Signed)
Tran, PA at bedside.  

## 2013-08-07 NOTE — Progress Notes (Signed)
VASCULAR LAB PRELIMINARY  ARTERIAL  ABI completed: ABIs are falsely elevated due to calcified vessels. ----Right BP is extremely elevated at 123456 mmHg systolic.     RIGHT    LEFT    PRESSURE WAVEFORM  PRESSURE WAVEFORM  BRACHIAL 225 Tri BRACHIAL NA   DP   DP    AT 301 Bi  AT 147 Bi  PT 127 Bi PT 192 Mono  PER   PER    GREAT TOE  NA GREAT TOE  NA    RIGHT LEFT  ABI 1.34 0.85     Landry Mellow, RDMS, RVT  08/07/2013, 4:30 PM

## 2013-08-08 ENCOUNTER — Inpatient Hospital Stay (HOSPITAL_COMMUNITY): Payer: Medicaid Other

## 2013-08-08 DIAGNOSIS — R079 Chest pain, unspecified: Secondary | ICD-10-CM

## 2013-08-08 LAB — BASIC METABOLIC PANEL
BUN: 12 mg/dL (ref 6–23)
Chloride: 97 mEq/L (ref 96–112)
Creatinine, Ser: 7.67 mg/dL — ABNORMAL HIGH (ref 0.50–1.35)
GFR calc Af Amer: 9 mL/min — ABNORMAL LOW (ref >90)
GFR calc non Af Amer: 8 mL/min — ABNORMAL LOW (ref >90)
Potassium: 3.8 mEq/L (ref 3.5–5.1)

## 2013-08-08 LAB — CBC
HCT: 33.2 % — ABNORMAL LOW (ref 39.0–52.0)
MCHC: 33.4 g/dL (ref 30.0–36.0)
Platelets: 262 10*3/uL (ref 150–400)
RDW: 13.5 % (ref 11.5–15.5)
WBC: 8.3 10*3/uL (ref 4.0–10.5)

## 2013-08-08 LAB — GLUCOSE, CAPILLARY: Glucose-Capillary: 115 mg/dL — ABNORMAL HIGH (ref 70–99)

## 2013-08-08 MED ORDER — VANCOMYCIN HCL IN DEXTROSE 1-5 GM/200ML-% IV SOLN: 1000.0000 mg | INTRAVENOUS | Status: DC

## 2013-08-08 MED ORDER — HYDROCODONE-ACETAMINOPHEN 5-325 MG PO TABS: 1.0000 | ORAL_TABLET | ORAL | Status: DC | PRN

## 2013-08-08 MED ORDER — MUPIROCIN 2 % EX OINT
1.0000 "application " | TOPICAL_OINTMENT | Freq: Two times a day (BID) | CUTANEOUS | Status: DC
  Administered 2013-08-08: 1 via NASAL
  Filled 2013-08-08: qty 22

## 2013-08-08 MED ORDER — LANTHANUM CARBONATE 1000 MG PO CHEW: 1000.0000 mg | CHEWABLE_TABLET | Freq: Three times a day (TID) | ORAL | Status: DC

## 2013-08-08 MED ORDER — CEFTAZIDIME 2 G IV SOLR: 2.0000 g | INTRAVENOUS | Status: DC | PRN

## 2013-08-08 MED ORDER — PROMETHAZINE HCL 25 MG PO TABS: 25.0000 mg | ORAL_TABLET | Freq: Four times a day (QID) | ORAL | Status: DC | PRN

## 2013-08-08 MED ORDER — AMLODIPINE BESYLATE 5 MG PO TABS: 5.0000 mg | ORAL_TABLET | Freq: Every day | ORAL | Status: DC

## 2013-08-08 MED ORDER — CEFTAZIDIME 2 G IJ SOLR: 2.0000 g | INTRAMUSCULAR | Status: DC | PRN

## 2013-08-08 MED ORDER — INSULIN GLARGINE 100 UNIT/ML ~~LOC~~ SOLN: 13.0000 [IU] | Freq: Every day | SUBCUTANEOUS | Status: DC

## 2013-08-08 MED ORDER — CHLORHEXIDINE GLUCONATE CLOTH 2 % EX PADS
6.0000 | MEDICATED_PAD | Freq: Every day | CUTANEOUS | Status: DC
  Administered 2013-08-08: 6 via TOPICAL

## 2013-08-08 MED ORDER — CALCIUM ACETATE 667 MG PO CAPS: 2001.0000 mg | ORAL_CAPSULE | Freq: Three times a day (TID) | ORAL | Status: DC

## 2013-08-08 NOTE — Consult Note (Signed)
ORTHOPAEDIC CONSULTATION  REQUESTING PHYSICIAN: [name] Bathe, MD  Chief Complaint: Left toe ulcer  HPI: [name] Wenzinger Sr. is a 37 y.o. male who complains of  Mild pain L toe, min drainage, improving with IV abx  Past Medical History  Diagnosis Date  . Renal disorder dialysis  . Diabetes mellitus    Past Surgical History  Procedure Laterality Date  . Av fistula placement  left arm   History   Social History  . Marital Status: Legally Separated    Spouse Name: N/A    Number of Children: N/A  . Years of Education: N/A   Social History Main Topics  . Smoking status: Current Every Day Smoker  . Smokeless tobacco: None  . Alcohol Use: Yes  . Drug Use: Yes    Special: Marijuana  . Sexual Activity:    Other Topics Concern  . None   Social History Narrative  . None   History reviewed. No pertinent family history. No Known Allergies Prior to Admission medications   Medication Sig Start Date End Date Taking? Authorizing Provider  calcium acetate (PHOSLO) 667 MG capsule Take 1,334 mg by mouth 3 (three) times daily with meals.   Yes Historical Provider, MD  insulin glargine (LANTUS) 100 UNIT/ML injection Inject 26 Units into the skin at bedtime.   Yes Historical Provider, MD  lanthanum (FOSRENOL) 1000 MG chewable tablet Chew 2,000 mg by mouth 2 (two) times daily with a meal.   Yes Historical Provider, MD  multivitamin (RENA-VIT) TABS tablet Take 1 tablet by mouth at bedtime.   Yes Historical Provider, MD  temazepam (RESTORIL) 15 MG capsule Take 15 mg by mouth at bedtime as needed for sleep.   Yes Historical Provider, MD   [name] Contrast  08/08/2013   CLINICAL DATA:  Soft tissue ulceration of the right great toe.  EXAM: MRI OF THE LEFT FOREFOOT WITHOUT CONTRAST  TECHNIQUE: Multiplanar, multisequence MR imaging was performed. No intravenous contrast was administered.  COMPARISON:  Radiographs dated 08/07/2013  FINDINGS: There is edema in the distal  phalangeal bone of the great toe and in the distal aspect of the proximal phalangeal bone of the great toe. There is a soft tissue ulceration along the lateral aspect of the great toe at the level of the metatarsal phalangeal joint. There is no joint effusion.  There is slight bone destruction the along the lateral aspect of the interphalangeal joint as demonstrated on the radiographs and on this MRI. The other visualized bones of the forefoot demonstrate no significant abnormality.  There are no abscesses.  IMPRESSION: Focal osteomyelitis of the lateral aspects of phalangeal bones at the interphalangeal joint of the great toe. The majority of the edema seen in the bones is probably reactive but there is clearly bone destruction on the radiographs.   Electronically Signed   By: [name] M.D.   On: 08/08/2013 08:27   Dg Toe Great Left  08/07/2013   CLINICAL DATA:  Left great toe pain, with wound infection.  EXAM: LEFT GREAT TOE  COMPARISON:  Left great toe radiographs performed 06/28/2013  FINDINGS: Vague lucency is noted at the lateral base of the first distal phalanx, raising concern for osteomyelitis, though this is difficult to fully assess on radiograph. Associated soft tissue swelling is noted.  Visualized joint spaces are grossly preserved. Diffuse vascular calcifications are seen. A bipartite medial sesamoid of the first toe is incidentally seen. An os naviculare is noted.  IMPRESSION:  1. Vague lucency at the lateral base of the first distal phalanx raises question for osteomyelitis, though this is difficult to fully assess on radiograph. 2. Bipartite medial sesamoid of the first toe; os naviculare seen. 3. Diffuse vascular calcifications noted.   Electronically Signed   By: [name] M.D.   On: 08/07/2013 04:47    Positive ROS: All other systems have been reviewed and were otherwise negative with the exception of those mentioned in the HPI and as above.  Labs cbc  Recent Labs   08/07/13 1534 08/08/13 0503  WBC 8.6 8.3  HGB 10.6* 11.1*  HCT 31.5* 33.2*  PLT 272 262    Labs inflam No results found for this basename: ESR, CRP,  in the last 72 hours  Labs coag No results found for this basename: INR, PT, PTT,  in the last 72 hours   Recent Labs  08/07/13 1826 08/08/13 0503  NA 137 136  K 3.5 3.8  CL 95* 97  CO2 30 28  GLUCOSE 110* 112*  BUN 26* 12  CREATININE 13.74* 7.67*  CALCIUM 9.0 8.7    Physical Exam: Filed Vitals:   08/08/13 0520  BP: 138/92  Pulse:   Temp:   Resp:    General: Alert, no acute distress Cardiovascular: No pedal edema Respiratory: No cyanosis, no use of accessory musculature GI: No organomegaly, abdomen is soft and non-tender Skin: No lesions in the area of chief complaint Neurologic: Sensation intact distally Psychiatric: Patient is competent for consent with normal mood and affect Lymphatic: No axillary or cervical lymphadenopathy  MUSCULOSKELETAL:  LLE: ulceration under the First toe lateral side, no drainage, min erythema and swelling.  Other extremities are atraumatic with painless ROM and NVI.  Assessment: Diabetic toe wound  Plan: No urgent indication for surgical intervention. Plan for outpatient follow up with [name] for management next week WBAT Wound care per wound team for now Hard sole shoe for comfort  Please call with further questions, will sign off for now   [name], D, MD Cell [phone]   08/08/2013 10:51 AM

## 2013-08-08 NOTE — Progress Notes (Signed)
Subjective: Interval History: has complaints N&V, hungry.  Objective: Vital signs in last 24 hours: Temp:  [97.6 F (36.4 C)-99.1 F (37.3 C)] 98.5 F (36.9 C) (12/24 0508) Pulse Rate:  [68-83] 76 (12/24 0508) Resp:  [14-18] 18 (12/24 0508) BP: (133-188)/(52-103) 138/92 mmHg (12/24 0520) SpO2:  [96 %-100 %] 100 % (12/24 0508) Weight:  [89.5 kg (197 lb 5 oz)-95.3 kg (210 lb 1.6 oz)] 89.5 kg (197 lb 5 oz) (12/23 2141) Weight change: 4.3 kg (9 lb 7.7 oz)  Intake/Output from previous day: 12/23 0701 - 12/24 0700 In: 103 [I.V.:3; IV Piggyback:100] Out: 4048  Intake/Output this shift:    General appearance: alert, cooperative and no distress Resp: diminished breath sounds bilaterally Cardio: S1, S2 normal and systolic murmur: holosystolic 2/6, blowing at apex GI: liver down 5 cm , pos bs, soft Extremities: LUA AVF B&T, warm L foot ankle down  Lab Results:  Recent Labs  08/07/13 1534 08/08/13 0503  WBC 8.6 8.3  HGB 10.6* 11.1*  HCT 31.5* 33.2*  PLT 272 262   BMET:  Recent Labs  08/07/13 1826 08/08/13 0503  NA 137 136  K 3.5 3.8  CL 95* 97  CO2 30 28  GLUCOSE 110* 112*  BUN 26* 12  CREATININE 13.74* 7.67*  CALCIUM 9.0 8.7   No results found for this basename: PTH,  in the last 72 hours Iron Studies: No results found for this basename: IRON, TIBC, TRANSFERRIN, FERRITIN,  in the last 72 hours  Studies/Results: [name] Contrast  08/08/2013   CLINICAL DATA:  Soft tissue ulceration of the right great toe.  EXAM: MRI OF THE LEFT FOREFOOT WITHOUT CONTRAST  TECHNIQUE: Multiplanar, multisequence MR imaging was performed. No intravenous contrast was administered.  COMPARISON:  Radiographs dated 08/07/2013  FINDINGS: There is edema in the distal phalangeal bone of the great toe and in the distal aspect of the proximal phalangeal bone of the great toe. There is a soft tissue ulceration along the lateral aspect of the great toe at the level of the metatarsal phalangeal  joint. There is no joint effusion.  There is slight bone destruction the along the lateral aspect of the interphalangeal joint as demonstrated on the radiographs and on this MRI. The other visualized bones of the forefoot demonstrate no significant abnormality.  There are no abscesses.  IMPRESSION: Focal osteomyelitis of the lateral aspects of phalangeal bones at the interphalangeal joint of the great toe. The majority of the edema seen in the bones is probably reactive but there is clearly bone destruction on the radiographs.   Electronically Signed   By: [name] M.D.   On: 08/08/2013 08:27   Dg Toe Great Left  08/07/2013   CLINICAL DATA:  Left great toe pain, with wound infection.  EXAM: LEFT GREAT TOE  COMPARISON:  Left great toe radiographs performed 06/28/2013  FINDINGS: Vague lucency is noted at the lateral base of the first distal phalanx, raising concern for osteomyelitis, though this is difficult to fully assess on radiograph. Associated soft tissue swelling is noted.  Visualized joint spaces are grossly preserved. Diffuse vascular calcifications are seen. A bipartite medial sesamoid of the first toe is incidentally seen. An os naviculare is noted.  IMPRESSION: 1. Vague lucency at the lateral base of the first distal phalanx raises question for osteomyelitis, though this is difficult to fully assess on radiograph. 2. Bipartite medial sesamoid of the first toe; os naviculare seen. 3. Diffuse vascular calcifications noted.   Electronically  Signed   By: [name] M.D.   On: 08/07/2013 04:47    I have reviewed the patient's current medications.  Assessment/Plan: 1 ESRD HD yest,lowered solute and vol 2 HTn better after HD, dry lower 3 DM controlled 4 Anemia epo 5 HPTH meds 6 Toe osteo on MRI and soft tiss infx On AB, ? Ortho eval P HD Fri, epo, AB, phenergan    LOS: 1 day   [name] L 08/08/2013,10:04 AM

## 2013-08-08 NOTE — Consult Note (Signed)
Pt pending WOC consult: noted new results from MRI:  Focal osteomyelitis of the lateral aspects of phalangeal bones at the interphalangeal joint of the great toe. The majority of the edema seen in the bones is probably reactive but there is clearly bone destruction on the radiographs.  For this reason will need orthopedic consult, outside of the scope for the [facility] nurse.  WOC will not consult on patient at this time Para March RN,CWOCN [id number]

## 2013-08-08 NOTE — Progress Notes (Signed)
Orthopedic Tech Progress Note Patient Details:  [name] Ludovico Sr. [birth year] [id number]  Ortho Devices Type of Ortho Device: Postop shoe/boot Ortho Device/Splint Interventions: Application   [name] 08/08/2013, 12:02 PM

## 2013-08-08 NOTE — Discharge Summary (Signed)
Physician Discharge Summary  [name] Eshelman Belgrade Sr. [id number] DOB: Jul 29, 1976 DOA: 08/07/2013  PCP: Placido Sou, MD  Admit date: 08/07/2013 Discharge date: 08/08/2013  Time spent: > 35 minutes  Recommendations for Outpatient Follow-up:  1. Continue your routine dialysis. You are to get Vancomycin and Ceftaz for 6 weeks during dialysis 2. Monitor blood sugars at least 2 times per day. Fasting and postprandial 3. Continue to monitor blood pressures  Discharge Diagnoses:  Principal Problem:   Diabetic foot ulcer with osteomyelitis Active Problems:   Diabetic foot ulcer   ESRD on dialysis   Diabetes   Discharge Condition: Stable  Diet recommendation: Diabetic diet  Filed Weights   08/07/13 0053 08/07/13 1710 08/07/13 2141  Weight: 91 kg (200 lb 9.9 oz) 95.3 kg (210 lb 1.6 oz) 89.5 kg (197 lb 5 oz)    History of present illness:  [name] Gorga Sr. is a 37 y.o. male  History of diabetes mellitus on insulin, diabetic foot ulcers, end-stage renal disease on hemodialysis on Monday Wednesday Friday. Presenting to the ED complaining of pain at left toe. Diagnosed with osteomyelitis.  Hospital Course:  1. Diabetic foot ulcer with osteomyelitis - Discussed with orthopaedic surgeon who recommended no surgery at this time. Per their recommendations: No urgent indication for surgical intervention.  Plan for outpatient follow up with [name] for management next week  WBAT  Wound care per wound team for now  Hard sole shoe for comfort - Discussed with ID who recommended Vancomycin and Ceftaz during dialysis for the next 6 wks. This was discussed with patient and nephrologist.  2. Diabetes  - Diabetic diet  - Will half dose of home Lantus, as blood sugars well controlled on this regimen.  - Routine blood sugar monitoring   3. End stage renal disease on dialysis  -Patient to continue routine dialysis sessions as recommended by his nephrologist. Please see above  recommendations regarding IV antibiotics.  Procedures:  None  Consultations:  Orthopaedic surgeon  Nephrology  Discharge Exam: Filed Vitals:   08/08/13 0520  BP: 138/92  Pulse:   Temp:   Resp:     General: Pt in NAD, alert and Awake Cardiovascular: RRR, no MRG Respiratory: CTA BL, no wheezes  Discharge Instructions  Discharge Orders   Future Appointments Provider Department Dept Phone   08/23/2013 11:30 AM Pixie Casino, MD [facility] [phone]   Future Orders Complete By Expires   Call MD for:  redness, tenderness, or signs of infection (pain, swelling, redness, odor or green/yellow discharge around incision site)  As directed    Call MD for:  severe uncontrolled pain  As directed    Call MD for:  temperature >100.4  As directed    Diet - low sodium heart healthy  As directed    Discharge instructions  As directed    Comments:     Patient to obtain IV antibiotic vancomycin and Ceftaz with dialysis for 6 weeks   Increase activity slowly  As directed        Medication List         amLODipine 5 MG tablet  Commonly known as:  NORVASC  Take 1 tablet (5 mg total) by mouth at bedtime.     calcium acetate 667 MG capsule  Commonly known as:  PHOSLO  Take 3 capsules (2,001 mg total) by mouth 3 (three) times daily with meals.     Ceftazidime 2 G Solr injection  Commonly known as:  Higher education careers adviser  Inject 2 g into the vein every dialysis.     HYDROcodone-acetaminophen 5-325 MG per tablet  Commonly known as:  NORCO/VICODIN  Take 1-2 tablets by mouth every 4 (four) hours as needed for moderate pain.     insulin glargine 100 UNIT/ML injection  Commonly known as:  LANTUS  Inject 0.13 mLs (13 Units total) into the skin at bedtime.     lanthanum 1000 MG chewable tablet  Commonly known as:  FOSRENOL  Chew 1 tablet (1,000 mg total) by mouth 3 (three) times daily with meals.     multivitamin Tabs tablet  Take 1 tablet by mouth at bedtime.     temazepam 15  MG capsule  Commonly known as:  RESTORIL  Take 15 mg by mouth at bedtime as needed for sleep.     vancomycin 1 GM/200ML Soln  Commonly known as:  VANCOCIN  Inject 200 mLs (1,000 mg total) into the vein every Monday, Wednesday, and Friday with hemodialysis.  Start taking on:  08/10/2013       No Known Allergies     Follow-up Information   Schedule an appointment as soon as possible for a visit with [name], MD. (next available)    Specialty:  Orthopedic Surgery   Contact information:   [address] [place] [address] [phone]        The results of significant diagnostics from this hospitalization (including imaging, microbiology, ancillary and laboratory) are listed below for reference.    Significant Diagnostic Studies: [name] Contrast  08/08/2013   CLINICAL DATA:  Soft tissue ulceration of the right great toe.  EXAM: MRI OF THE LEFT FOREFOOT WITHOUT CONTRAST  TECHNIQUE: Multiplanar, multisequence MR imaging was performed. No intravenous contrast was administered.  COMPARISON:  Radiographs dated 08/07/2013  FINDINGS: There is edema in the distal phalangeal bone of the great toe and in the distal aspect of the proximal phalangeal bone of the great toe. There is a soft tissue ulceration along the lateral aspect of the great toe at the level of the metatarsal phalangeal joint. There is no joint effusion.  There is slight bone destruction the along the lateral aspect of the interphalangeal joint as demonstrated on the radiographs and on this MRI. The other visualized bones of the forefoot demonstrate no significant abnormality.  There are no abscesses.  IMPRESSION: Focal osteomyelitis of the lateral aspects of phalangeal bones at the interphalangeal joint of the great toe. The majority of the edema seen in the bones is probably reactive but there is clearly bone destruction on the radiographs.   Electronically Signed   By: [name] M.D.   On: 08/08/2013  08:27   Dg Toe Great Left  08/07/2013   CLINICAL DATA:  Left great toe pain, with wound infection.  EXAM: LEFT GREAT TOE  COMPARISON:  Left great toe radiographs performed 06/28/2013  FINDINGS: Vague lucency is noted at the lateral base of the first distal phalanx, raising concern for osteomyelitis, though this is difficult to fully assess on radiograph. Associated soft tissue swelling is noted.  Visualized joint spaces are grossly preserved. Diffuse vascular calcifications are seen. A bipartite medial sesamoid of the first toe is incidentally seen. An os naviculare is noted.  IMPRESSION: 1. Vague lucency at the lateral base of the first distal phalanx raises question for osteomyelitis, though this is difficult to fully assess on radiograph. 2. Bipartite medial sesamoid of the first toe; os naviculare seen. 3. Diffuse vascular calcifications noted.  Electronically Signed   By: [name] M.D.   On: 08/07/2013 04:47    Microbiology: Recent Results (from the past 240 hour(s))  MRSA PCR SCREENING     Status: Abnormal   Collection Time    08/07/13  6:45 AM      Result Value Range Status   MRSA by PCR POSITIVE (*) NEGATIVE Final   Comment:            The GeneXpert MRSA Assay (FDA     approved for NASAL specimens     only), is one component of a     comprehensive MRSA colonization     surveillance program. It is not     intended to diagnose MRSA     infection nor to guide or     monitor treatment for     MRSA infections.     RESULT CALLED TO, READ BACK BY AND VERIFIED WITH:     Salley Slaughter 08/07/13 0821 BY K SCHULTZ     Labs: Basic Metabolic Panel:  Recent Labs Lab 08/07/13 0330 08/07/13 1534 08/07/13 1826 08/08/13 0503  NA 137  --  137 136  K 3.1*  --  3.5 3.8  CL 92*  --  95* 97  CO2  --   --  30 28  GLUCOSE 138*  --  110* 112*  BUN 23  --  26* 12  CREATININE 12.90* 13.75* 13.74* 7.67*  CALCIUM  --   --  9.0 8.7  PHOS  --   --  3.5  --    Liver Function  Tests:  Recent Labs Lab 08/07/13 1826  AST 10  ALT 6  ALKPHOS 47  BILITOT 0.4  PROT 7.0  ALBUMIN 3.0*   No results found for this basename: LIPASE, AMYLASE,  in the last 168 hours No results found for this basename: AMMONIA,  in the last 168 hours CBC:  Recent Labs Lab 08/07/13 0324 08/07/13 0330 08/07/13 1534 08/08/13 0503  WBC 10.4  --  8.6 8.3  NEUTROABS 6.9  --   --   --   HGB 11.4* 12.2* 10.6* 11.1*  HCT 33.4* 36.0* 31.5* 33.2*  MCV 83.3  --  84.2 84.1  PLT 300  --  272 262   Cardiac Enzymes: No results found for this basename: CKTOTAL, CKMB, CKMBINDEX, TROPONINI,  in the last 168 hours BNP: BNP (last 3 results) No results found for this basename: PROBNP,  in the last 8760 hours CBG:  Recent Labs Lab 08/07/13 1046 08/07/13 2235 08/08/13 0752 08/08/13 1136  GLUCAP 95 99 122* 115*       Signed:  Velvet Bathe  [facility] 08/08/2013, 3:24 PM

## 2013-08-12 LAB — WOUND CULTURE

## 2013-08-21 ENCOUNTER — Ambulatory Visit: Payer: Medicaid Other | Admitting: Cardiovascular Disease

## 2013-08-22 ENCOUNTER — Ambulatory Visit: Payer: Medicaid Other | Admitting: Cardiovascular Disease

## 2013-08-23 ENCOUNTER — Ambulatory Visit: Payer: Medicaid Other | Admitting: Internal Medicine

## 2013-09-03 ENCOUNTER — Encounter: Payer: Self-pay | Admitting: Cardiovascular Disease

## 2013-09-24 ENCOUNTER — Other Ambulatory Visit (HOSPITAL_COMMUNITY): Payer: Self-pay | Admitting: Orthopedic Surgery

## 2013-09-26 MED ORDER — CEFAZOLIN SODIUM-DEXTROSE 2-3 GM-% IV SOLR
2.0000 g | INTRAVENOUS | Status: AC
  Administered 2013-09-27: 2 g via INTRAVENOUS
  Filled 2013-09-26: qty 50

## 2013-09-26 NOTE — Progress Notes (Signed)
Unable to get in touch with pt. Number that we had did not allow for messages to be left. I called Dr. Jess Barters office and they gave me the # [phone]. Every time I called it, it went straight to voicemail. Called numerous times. Just now left message giving pt instructions for surgery tomorrow.  Time of arrival, NPO after midnight, do not take any meds prior to arrival, instructions on how to get here.

## 2013-09-27 ENCOUNTER — Encounter (HOSPITAL_COMMUNITY)
Admission: RE | Disposition: A | Discharge status: Home / Self Care | Payer: Self-pay | Source: Ambulatory Visit | Attending: Orthopedic Surgery

## 2013-09-27 ENCOUNTER — Ambulatory Visit (HOSPITAL_COMMUNITY): Payer: Medicaid Other | Admitting: Anesthesiology

## 2013-09-27 ENCOUNTER — Encounter (HOSPITAL_COMMUNITY): Payer: Medicaid Other | Admitting: Anesthesiology

## 2013-09-27 ENCOUNTER — Encounter (HOSPITAL_COMMUNITY): Payer: Self-pay | Admitting: *Deleted

## 2013-09-27 ENCOUNTER — Ambulatory Visit (HOSPITAL_COMMUNITY): Payer: Medicaid Other

## 2013-09-27 ENCOUNTER — Ambulatory Visit (HOSPITAL_COMMUNITY)
Admission: RE | Admit: 2013-09-27 | Discharge: 2013-09-27 | Disposition: A | Discharge status: Home / Self Care | Payer: Medicaid Other | Source: Ambulatory Visit | Attending: Orthopedic Surgery | Admitting: Orthopedic Surgery

## 2013-09-27 DIAGNOSIS — M869 Osteomyelitis, unspecified: Secondary | ICD-10-CM

## 2013-09-27 DIAGNOSIS — E1149 Type 2 diabetes mellitus with other diabetic neurological complication: Secondary | ICD-10-CM | POA: Insufficient documentation

## 2013-09-27 DIAGNOSIS — M9080 Osteopathy in diseases classified elsewhere, unspecified site: Secondary | ICD-10-CM | POA: Insufficient documentation

## 2013-09-27 DIAGNOSIS — N186 End stage renal disease: Secondary | ICD-10-CM | POA: Insufficient documentation

## 2013-09-27 DIAGNOSIS — E1142 Type 2 diabetes mellitus with diabetic polyneuropathy: Secondary | ICD-10-CM | POA: Insufficient documentation

## 2013-09-27 DIAGNOSIS — E1169 Type 2 diabetes mellitus with other specified complication: Secondary | ICD-10-CM | POA: Insufficient documentation

## 2013-09-27 DIAGNOSIS — Z992 Dependence on renal dialysis: Secondary | ICD-10-CM | POA: Insufficient documentation

## 2013-09-27 DIAGNOSIS — I120 Hypertensive chronic kidney disease with stage 5 chronic kidney disease or end stage renal disease: Secondary | ICD-10-CM | POA: Insufficient documentation

## 2013-09-27 DIAGNOSIS — L97509 Non-pressure chronic ulcer of other part of unspecified foot with unspecified severity: Secondary | ICD-10-CM | POA: Insufficient documentation

## 2013-09-27 DIAGNOSIS — F17200 Nicotine dependence, unspecified, uncomplicated: Secondary | ICD-10-CM | POA: Insufficient documentation

## 2013-09-27 HISTORY — PX: AMPUTATION: SHX166

## 2013-09-27 LAB — POCT I-STAT 4, (NA,K, GLUC, HGB,HCT)
Glucose, Bld: 96 mg/dL (ref 70–99)
HCT: 34 % — ABNORMAL LOW (ref 39.0–52.0)
Hemoglobin: 11.6 g/dL — ABNORMAL LOW (ref 13.0–17.0)
Potassium: 2.8 mEq/L — CL (ref 3.7–5.3)
Sodium: 141 mEq/L (ref 137–147)

## 2013-09-27 LAB — COMPREHENSIVE METABOLIC PANEL
ALT: 14 U/L (ref 0–53)
AST: 25 U/L (ref 0–37)
Albumin: 3.4 g/dL — ABNORMAL LOW (ref 3.5–5.2)
Alkaline Phosphatase: 60 U/L (ref 39–117)
BUN: 12 mg/dL (ref 6–23)
CO2: 28 meq/L (ref 19–32)
CREATININE: 7.5 mg/dL — AB (ref 0.50–1.35)
Calcium: 9.2 mg/dL (ref 8.4–10.5)
Chloride: 94 mEq/L — ABNORMAL LOW (ref 96–112)
GFR calc Af Amer: 10 mL/min — ABNORMAL LOW (ref >90)
GFR, EST NON AFRICAN AMERICAN: 8 mL/min — AB (ref >90)
Glucose, Bld: 107 mg/dL — ABNORMAL HIGH (ref 70–99)
Potassium: 3.5 mEq/L — ABNORMAL LOW (ref 3.7–5.3)
Sodium: 140 mEq/L (ref 137–147)
TOTAL PROTEIN: 8 g/dL (ref 6.0–8.3)
Total Bilirubin: 0.6 mg/dL (ref 0.3–1.2)

## 2013-09-27 LAB — SURGICAL PCR SCREEN
MRSA, PCR: NEGATIVE
STAPHYLOCOCCUS AUREUS: NEGATIVE

## 2013-09-27 LAB — PROTIME-INR
INR: 1.05 (ref 0.00–1.49)
Prothrombin Time: 13.5 seconds (ref 11.6–15.2)

## 2013-09-27 LAB — GLUCOSE, CAPILLARY: GLUCOSE-CAPILLARY: 102 mg/dL — AB (ref 70–99)

## 2013-09-27 LAB — CBC
HCT: 34.5 % — ABNORMAL LOW (ref 39.0–52.0)
Hemoglobin: 11.5 g/dL — ABNORMAL LOW (ref 13.0–17.0)
MCH: 28.3 pg (ref 26.0–34.0)
MCHC: 33.3 g/dL (ref 30.0–36.0)
MCV: 84.8 fL (ref 78.0–100.0)
PLATELETS: 359 10*3/uL (ref 150–400)
RBC: 4.07 MIL/uL — ABNORMAL LOW (ref 4.22–5.81)
RDW: 16.1 % — ABNORMAL HIGH (ref 11.5–15.5)
WBC: 6.7 10*3/uL (ref 4.0–10.5)

## 2013-09-27 SURGERY — AMPUTATION DIGIT
Anesthesia: General | Site: Foot | Laterality: Left

## 2013-09-27 MED ORDER — MIDAZOLAM HCL 2 MG/2ML IJ SOLN
INTRAMUSCULAR | Status: AC
  Filled 2013-09-27: qty 2

## 2013-09-27 MED ORDER — OXYCODONE HCL 5 MG PO TABS
ORAL_TABLET | ORAL | Status: AC
  Filled 2013-09-27: qty 1

## 2013-09-27 MED ORDER — ONDANSETRON HCL 4 MG/2ML IJ SOLN
INTRAMUSCULAR | Status: AC
  Filled 2013-09-27: qty 2

## 2013-09-27 MED ORDER — ONDANSETRON HCL 4 MG/2ML IJ SOLN: 4.0000 mg | Freq: Four times a day (QID) | INTRAMUSCULAR | Status: DC | PRN

## 2013-09-27 MED ORDER — LIDOCAINE HCL (CARDIAC) 20 MG/ML IV SOLN
INTRAVENOUS | Status: DC | PRN
  Administered 2013-09-27: 100 mg via INTRAVENOUS

## 2013-09-27 MED ORDER — ROCURONIUM BROMIDE 50 MG/5ML IV SOLN
INTRAVENOUS | Status: AC
  Filled 2013-09-27: qty 1

## 2013-09-27 MED ORDER — PROPOFOL 10 MG/ML IV BOLUS
INTRAVENOUS | Status: DC | PRN
  Administered 2013-09-27: 200 mg via INTRAVENOUS

## 2013-09-27 MED ORDER — OXYCODONE HCL 5 MG/5ML PO SOLN: 5.0000 mg | Freq: Once | ORAL | Status: AC | PRN

## 2013-09-27 MED ORDER — FENTANYL CITRATE 0.05 MG/ML IJ SOLN
25.0000 ug | INTRAMUSCULAR | Status: DC | PRN
  Administered 2013-09-27 (×4): 50 ug via INTRAVENOUS

## 2013-09-27 MED ORDER — FENTANYL CITRATE 0.05 MG/ML IJ SOLN
INTRAMUSCULAR | Status: AC
  Filled 2013-09-27: qty 2

## 2013-09-27 MED ORDER — HYDROMORPHONE HCL PF 1 MG/ML IJ SOLN
INTRAMUSCULAR | Status: AC
  Filled 2013-09-27: qty 1

## 2013-09-27 MED ORDER — POTASSIUM CHLORIDE 10 MEQ/100ML IV SOLN
10.0000 meq | INTRAVENOUS | Status: AC
Start: 2013-09-27 — End: 2013-09-27
  Administered 2013-09-27 (×2): 10 meq via INTRAVENOUS
  Filled 2013-09-27 (×2): qty 100

## 2013-09-27 MED ORDER — MUPIROCIN 2 % EX OINT
TOPICAL_OINTMENT | Freq: Once | CUTANEOUS | Status: DC
  Filled 2013-09-27: qty 22

## 2013-09-27 MED ORDER — OXYCODONE HCL 5 MG PO TABS
5.0000 mg | ORAL_TABLET | Freq: Once | ORAL | Status: AC | PRN
  Administered 2013-09-27: 5 mg via ORAL

## 2013-09-27 MED ORDER — SODIUM CHLORIDE 0.9 % IV SOLN
INTRAVENOUS | Status: DC
  Administered 2013-09-27: 09:00:00 via INTRAVENOUS

## 2013-09-27 MED ORDER — OXYCODONE-ACETAMINOPHEN 5-325 MG PO TABS: 1.0000 | ORAL_TABLET | ORAL | Status: DC | PRN

## 2013-09-27 MED ORDER — FENTANYL CITRATE 0.05 MG/ML IJ SOLN
INTRAMUSCULAR | Status: DC | PRN
  Administered 2013-09-27 (×3): 50 ug via INTRAVENOUS

## 2013-09-27 MED ORDER — ONDANSETRON HCL 4 MG/2ML IJ SOLN
INTRAMUSCULAR | Status: DC | PRN
Start: 2013-09-27 — End: 2013-09-27
  Administered 2013-09-27: 4 mg via INTRAVENOUS

## 2013-09-27 MED ORDER — FENTANYL CITRATE 0.05 MG/ML IJ SOLN
INTRAMUSCULAR | Status: AC
  Filled 2013-09-27: qty 5

## 2013-09-27 MED ORDER — PROPOFOL 10 MG/ML IV BOLUS
INTRAVENOUS | Status: AC
  Filled 2013-09-27: qty 20

## 2013-09-27 MED ORDER — MUPIROCIN 2 % EX OINT
TOPICAL_OINTMENT | CUTANEOUS | Status: AC
  Filled 2013-09-27: qty 22

## 2013-09-27 MED ORDER — HYDROMORPHONE HCL PF 1 MG/ML IJ SOLN
0.2500 mg | INTRAMUSCULAR | Status: DC | PRN
  Administered 2013-09-27 (×2): 0.5 mg via INTRAVENOUS

## 2013-09-27 MED ORDER — LIDOCAINE HCL (CARDIAC) 20 MG/ML IV SOLN
INTRAVENOUS | Status: AC
  Filled 2013-09-27: qty 5

## 2013-09-27 MED ORDER — 0.9 % SODIUM CHLORIDE (POUR BTL) OPTIME
TOPICAL | Status: DC | PRN
  Administered 2013-09-27: 1000 mL

## 2013-09-27 SURGICAL SUPPLY — 35 items
BANDAGE GAUZE ELAST BULKY 4 IN (GAUZE/BANDAGES/DRESSINGS) ×1 IMPLANT
BNDG CMPR 9X4 STRL LF SNTH (GAUZE/BANDAGES/DRESSINGS) ×1
BNDG COHESIVE 4X5 TAN STRL (GAUZE/BANDAGES/DRESSINGS) ×3 IMPLANT
BNDG ESMARK 4X9 LF (GAUZE/BANDAGES/DRESSINGS) ×2 IMPLANT
BNDG GAUZE ELAST 4 BULKY (GAUZE/BANDAGES/DRESSINGS) ×2 IMPLANT
COVER SURGICAL LIGHT HANDLE (MISCELLANEOUS) ×3 IMPLANT
DRAPE U-SHAPE 47X51 STRL (DRAPES) ×3 IMPLANT
DRSG ADAPTIC 3X8 NADH LF (GAUZE/BANDAGES/DRESSINGS) ×2 IMPLANT
DRSG PAD ABDOMINAL 8X10 ST (GAUZE/BANDAGES/DRESSINGS) ×3 IMPLANT
DURAPREP 26ML APPLICATOR (WOUND CARE) ×3 IMPLANT
ELECT REM PT RETURN 9FT ADLT (ELECTROSURGICAL) ×3
ELECTRODE REM PT RTRN 9FT ADLT (ELECTROSURGICAL) ×1 IMPLANT
GLOVE BIOGEL PI IND STRL 6.5 (GLOVE) IMPLANT
GLOVE BIOGEL PI IND STRL 9 (GLOVE) ×1 IMPLANT
GLOVE BIOGEL PI INDICATOR 6.5 (GLOVE) ×4
GLOVE BIOGEL PI INDICATOR 9 (GLOVE) ×2
GLOVE SURG ORTHO 9.0 STRL STRW (GLOVE) ×3 IMPLANT
GLOVE SURG SS PI 6.5 STRL IVOR (GLOVE) ×2 IMPLANT
GOWN ISOLATION IMPERV (GOWNS) ×4 IMPLANT
GOWN STRL REUS W/ TWL LRG LVL3 (GOWN DISPOSABLE) IMPLANT
GOWN STRL REUS W/TWL 2XL LVL3 (GOWN DISPOSABLE) ×2 IMPLANT
GOWN STRL REUS W/TWL LRG LVL3 (GOWN DISPOSABLE) ×6
KIT BASIN OR (CUSTOM PROCEDURE TRAY) ×3 IMPLANT
KIT ROOM TURNOVER OR (KITS) ×3 IMPLANT
NEEDLE 22X1 1/2 (OR ONLY) (NEEDLE) IMPLANT
NS IRRIG 1000ML POUR BTL (IV SOLUTION) ×3 IMPLANT
PACK ORTHO EXTREMITY (CUSTOM PROCEDURE TRAY) ×3 IMPLANT
PAD ARMBOARD 7.5X6 YLW CONV (MISCELLANEOUS) ×6 IMPLANT
SPONGE GAUZE 4X4 12PLY (GAUZE/BANDAGES/DRESSINGS) IMPLANT
SPONGE GAUZE 4X4 12PLY STER LF (GAUZE/BANDAGES/DRESSINGS) ×2 IMPLANT
SUCTION FRAZIER TIP 10 FR DISP (SUCTIONS) IMPLANT
SUT ETHILON 2 0 PSLX (SUTURE) ×3 IMPLANT
SYR CONTROL 10ML LL (SYRINGE) ×2 IMPLANT
TOWEL OR 17X24 6PK STRL BLUE (TOWEL DISPOSABLE) ×3 IMPLANT
TOWEL OR 17X26 10 PK STRL BLUE (TOWEL DISPOSABLE) ×3 IMPLANT

## 2013-09-27 NOTE — Anesthesia Postprocedure Evaluation (Signed)
Anesthesia Post Note  Patient: [name] Forgette Sr.  Procedure(s) Performed: Procedure(s) (LRB): LEFT GREAT TOE AMPUTATION (Left)  Anesthesia type: General  Patient location: PACU  Post pain: Pain level controlled and Adequate analgesia  Post assessment: Post-op Vital signs reviewed, Patient's Cardiovascular Status Stable, Respiratory Function Stable, Patent Airway and Pain level controlled  Last Vitals:  Filed Vitals:   09/27/13 1255  BP: 209/110  Pulse: 104  Temp:   Resp: 24    Post vital signs: Reviewed and stable  Level of consciousness: awake, alert  and oriented  Complications: No apparent anesthesia complications

## 2013-09-27 NOTE — Preoperative (Signed)
Beta Blockers   Reason not to administer Beta Blockers:Not Applicable 

## 2013-09-27 NOTE — Progress Notes (Signed)
09/27/13 0940  OBSTRUCTIVE SLEEP APNEA  Have you ever been diagnosed with sleep apnea through a sleep study? No  Do you snore loudly (loud enough to be heard through closed doors)?  1  Do you often feel tired, fatigued, or sleepy during the daytime? 1  Has anyone observed you stop breathing during your sleep? 1  Do you have, or are you being treated for high blood pressure? 1  BMI more than 35 kg/m2? 0  Age over 37 years old? 0  Neck circumference greater than 40 cm/18 inches? 0  Gender: 1  Obstructive Sleep Apnea Score 5  Score 4 or greater  Results sent to PCP

## 2013-09-27 NOTE — Anesthesia Preprocedure Evaluation (Signed)
Anesthesia Evaluation  Patient identified by MRN, date of birth, ID band Patient awake    Reviewed: Allergy & Precautions, H&P , NPO status , Patient's Chart, lab work & pertinent test results  Airway Mallampati: II  Neck ROM: full    Dental   Pulmonary Current Smoker,          Cardiovascular     Neuro/Psych    GI/Hepatic   Endo/Other  diabetes, Type 2  Renal/GU ESRF and DialysisRenal disease     Musculoskeletal   Abdominal   Peds  Hematology   Anesthesia Other Findings   Reproductive/Obstetrics                           Anesthesia Physical Anesthesia Plan  ASA: III  Anesthesia Plan: General   Post-op Pain Management:    Induction: Intravenous  Airway Management Planned: LMA  Additional Equipment:   Intra-op Plan:   Post-operative Plan:   Informed Consent: I have reviewed the patients History and Physical, chart, labs and discussed the procedure including the risks, benefits and alternatives for the proposed anesthesia with the patient or authorized representative who has indicated his/her understanding and acceptance.     Plan Discussed with: CRNA, Anesthesiologist and Surgeon  Anesthesia Plan Comments:         Anesthesia Quick Evaluation

## 2013-09-27 NOTE — Progress Notes (Signed)
Orthopedic Tech Progress Note Patient Details:  Fiona Hogenson Sr. 1975-09-22 [id number]  Ortho Devices Type of Ortho Device: Postop shoe/boot;Crutches Ortho Device/Splint Interventions: Application   Cammer, Theodoro Parma 09/27/2013, 3:21 PM

## 2013-09-27 NOTE — Op Note (Signed)
OPERATIVE REPORT  DATE OF SURGERY: 09/27/2013  PATIENT:  [name] Labella Sr.,  38 y.o. male  PRE-OPERATIVE DIAGNOSIS:  Osteomyelitis Left Great Toe  POST-OPERATIVE DIAGNOSIS:  Osteomyelitis Left Great Toe  PROCEDURE:  Procedure(s): LEFT GREAT TOE AMPUTATION at the MTP joint.  SURGEON:  Surgeon(s): [name], MD  ANESTHESIA:   general  EBL:  Minimal ML  SPECIMEN:  No Specimen  TOURNIQUET:  * No tourniquets in log *  PROCEDURE DETAILS: Patient is a 38 year old gentleman end-stage renal disease dialysis with diabetes who presents with [place] mellitus ulceration of the left great toe. Patient is failed conservative care and presents at this time for amputation. Risks and benefits were discussed including persistent infection nonhealing of the wound need for higher level amputation. Patient states he understands and wished to proceed at this time. Description of procedure patient was brought to the operating room and underwent a general anesthetic. After adequate levels of anesthesia were obtained patient's left lower extremity was prepped using DuraPrep and draped into a sterile field. A fishmouth incision was made just distal to the MTP joint. The toe was amputated through the MTP joint. Hemostasis was obtained the wound was irrigated with normal saline there was a small amount petechial bleeding. The incision was closed using 2-0 nylon. The wound was covered with Adaptic orthopedic sponges AB dressing Kerlix and Coban. Patient was extubated taken to the PACU in stable condition. Prescription for Percocet for pain followup in the office in one week.  PLAN OF CARE: Discharge to home after PACU  PATIENT DISPOSITION:  PACU - hemodynamically stable.   [name], MD 09/27/2013 12:15 PM

## 2013-09-27 NOTE — H&P (Signed)
[name] Striegel Sr. is an 38 y.o. male.   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  Past Medical History  Diagnosis Date  . Renal disorder dialysis  . Diabetes mellitus     Past Surgical History  Procedure Laterality Date  . Av fistula placement  left arm    No family history on file. Social History:  reports that he has been smoking.  He does not have any smokeless tobacco history on file. He reports that he drinks alcohol. He reports that he uses illicit drugs (Marijuana).  Allergies: No Known Allergies  No prescriptions prior to admission    No results found for this or any previous visit (from the past 48 hour(s)). No results found.  Review of Systems  All other systems reviewed and are negative.    There were no vitals taken for this visit. Physical Exam  Examination patient has a palpable dorsalis pedis pulse. He has ulceration osteomyelitis of the great toe. Assessment/Plan Assessment: Ulceration osteomyelitis left great toe.  Plan: We'll plan for amputation of the left great toe through the MTP joint. Risks and benefits were discussed including persistent infection nonhealing of the wound need for additional surgery. Patient states he understands and wished to proceed at this time.  DUDA,MARCUS V 09/27/2013, 6:20 AM

## 2013-09-27 NOTE — Discharge Instructions (Signed)
Elevate left foot level with the heart. Keep dressing clean dry and intact.  What to eat:  For your first meals, you should eat lightly; only small meals initially.  If you do not have nausea, you may eat larger meals.  Avoid spicy, greasy and heavy food.    General Anesthesia, Adult, Care After  Refer to this sheet in the next few weeks. These instructions provide you with information on caring for yourself after your procedure. Your health care provider may also give you more specific instructions. Your treatment has been planned according to current medical practices, but problems sometimes occur. Call your health care provider if you have any problems or questions after your procedure.  WHAT TO EXPECT AFTER THE PROCEDURE  After the procedure, it is typical to experience:  Sleepiness.  Nausea and vomiting. HOME CARE INSTRUCTIONS  For the first 24 hours after general anesthesia:  Have a responsible person with you.  Do not drive a car. If you are alone, do not take public transportation.  Do not drink alcohol.  Do not take medicine that has not been prescribed by your health care provider.  Do not sign important papers or make important decisions.  You may resume a normal diet and activities as directed by your health care provider.  Change bandages (dressings) as directed.  If you have questions or problems that seem related to general anesthesia, call the hospital and ask for the anesthetist or anesthesiologist on call. SEEK MEDICAL CARE IF:  You have nausea and vomiting that continue the day after anesthesia.  You develop a rash. SEEK IMMEDIATE MEDICAL CARE IF:  You have difficulty breathing.  You have chest pain.  You have any allergic problems. Document Released: 11/08/2000 Document Revised: 04/04/2013 Document Reviewed: 02/15/2013  [facility] Patient Information 2014 [place], [place].

## 2013-09-27 NOTE — Transfer of Care (Signed)
Immediate Anesthesia Transfer of Care Note  Patient: [name] Rippon Sr.  Procedure(s) Performed: Procedure(s): LEFT GREAT TOE AMPUTATION (Left)  Patient Location: PACU  Anesthesia Type:General  Level of Consciousness: awake, alert , oriented and patient cooperative  Airway & Oxygen Therapy: Patient Spontanous Breathing and Patient connected to nasal cannula oxygen  Post-op Assessment: Report given to PACU RN, Post -op Vital signs reviewed and stable and Patient moving all extremities  Post vital signs: Reviewed and stable  Complications: No apparent anesthesia complications

## 2013-09-27 NOTE — Anesthesia Procedure Notes (Signed)
Procedure Name: LMA Insertion Date/Time: 09/27/2013 11:53 AM Performed by: Julian Reil Pre-anesthesia Checklist: Patient identified, Emergency Drugs available, Suction available and Patient being monitored Patient Re-evaluated:Patient Re-evaluated prior to inductionOxygen Delivery Method: Circle system utilized Preoxygenation: Pre-oxygenation with 100% oxygen Intubation Type: IV induction LMA: LMA inserted LMA Size: 5.0 Tube type: Oral Number of attempts: 1 Placement Confirmation: positive ETCO2 and breath sounds checked- equal and bilateral Tube secured with: Tape Dental Injury: Teeth and Oropharynx as per pre-operative assessment

## 2013-09-28 ENCOUNTER — Encounter (HOSPITAL_COMMUNITY): Payer: Self-pay | Admitting: Orthopedic Surgery

## 2014-04-30 ENCOUNTER — Other Ambulatory Visit (HOSPITAL_COMMUNITY): Payer: Self-pay | Admitting: Nephrology

## 2014-04-30 ENCOUNTER — Other Ambulatory Visit (HOSPITAL_COMMUNITY): Payer: Self-pay | Admitting: *Deleted

## 2014-04-30 ENCOUNTER — Telehealth (HOSPITAL_COMMUNITY): Payer: Self-pay

## 2014-04-30 DIAGNOSIS — R0789 Other chest pain: Secondary | ICD-10-CM

## 2014-04-30 NOTE — Telephone Encounter (Signed)
Encounter complete. 

## 2014-05-02 ENCOUNTER — Encounter (HOSPITAL_COMMUNITY): Payer: Medicaid Other

## 2014-05-06 ENCOUNTER — Encounter (HOSPITAL_COMMUNITY): Payer: Self-pay | Admitting: Emergency Medicine

## 2014-05-06 ENCOUNTER — Emergency Department (HOSPITAL_COMMUNITY)
Admission: EM | Admit: 2014-05-06 | Discharge: 2014-05-06 | Disposition: A | Discharge status: Home / Self Care | Payer: Medicaid Other | Attending: Emergency Medicine | Admitting: Emergency Medicine

## 2014-05-06 ENCOUNTER — Emergency Department (HOSPITAL_COMMUNITY): Payer: Medicaid Other

## 2014-05-06 DIAGNOSIS — Z79899 Other long term (current) drug therapy: Secondary | ICD-10-CM | POA: Insufficient documentation

## 2014-05-06 DIAGNOSIS — Z87448 Personal history of other diseases of urinary system: Secondary | ICD-10-CM | POA: Insufficient documentation

## 2014-05-06 DIAGNOSIS — Y9289 Other specified places as the place of occurrence of the external cause: Secondary | ICD-10-CM | POA: Insufficient documentation

## 2014-05-06 DIAGNOSIS — X500XXA Overexertion from strenuous movement or load, initial encounter: Secondary | ICD-10-CM | POA: Diagnosis not present

## 2014-05-06 DIAGNOSIS — Z794 Long term (current) use of insulin: Secondary | ICD-10-CM | POA: Diagnosis not present

## 2014-05-06 DIAGNOSIS — M67911 Unspecified disorder of synovium and tendon, right shoulder: Secondary | ICD-10-CM

## 2014-05-06 DIAGNOSIS — S46909A Unspecified injury of unspecified muscle, fascia and tendon at shoulder and upper arm level, unspecified arm, initial encounter: Secondary | ICD-10-CM | POA: Diagnosis present

## 2014-05-06 DIAGNOSIS — Y9389 Activity, other specified: Secondary | ICD-10-CM | POA: Insufficient documentation

## 2014-05-06 DIAGNOSIS — E119 Type 2 diabetes mellitus without complications: Secondary | ICD-10-CM | POA: Insufficient documentation

## 2014-05-06 DIAGNOSIS — F17200 Nicotine dependence, unspecified, uncomplicated: Secondary | ICD-10-CM | POA: Diagnosis not present

## 2014-05-06 DIAGNOSIS — S4980XA Other specified injuries of shoulder and upper arm, unspecified arm, initial encounter: Secondary | ICD-10-CM | POA: Insufficient documentation

## 2014-05-06 DIAGNOSIS — Z7982 Long term (current) use of aspirin: Secondary | ICD-10-CM | POA: Insufficient documentation

## 2014-05-06 MED ORDER — HYDROCODONE-ACETAMINOPHEN 5-325 MG PO TABS
1.0000 | ORAL_TABLET | Freq: Once | ORAL | Status: AC
  Administered 2014-05-06: 1 via ORAL
  Filled 2014-05-06: qty 1

## 2014-05-06 MED ORDER — METHOCARBAMOL 500 MG PO TABS: 500.0000 mg | ORAL_TABLET | Freq: Two times a day (BID) | ORAL | Status: DC | PRN

## 2014-05-06 MED ORDER — CYCLOBENZAPRINE HCL 10 MG PO TABS
5.0000 mg | ORAL_TABLET | Freq: Once | ORAL | Status: AC
  Administered 2014-05-06: 5 mg via ORAL
  Filled 2014-05-06: qty 1

## 2014-05-06 MED ORDER — HYDROCODONE-ACETAMINOPHEN 5-325 MG PO TABS: ORAL_TABLET | ORAL | Status: DC

## 2014-05-06 NOTE — ED Notes (Signed)
Pt presents with right shoulder pain onset Sunday after waking up- pt states today he was lifting a case of water and heard a "pop".  Pt reports pain with movement, no obvious deformity noted- distal pulses intact.

## 2014-05-06 NOTE — ED Provider Notes (Signed)
CSN: [id number]     Arrival date & time 05/06/14  2045 History  This chart was scribed for non-physician practitioner, Monico Blitz, PA-C working with Debby Freiberg, MD by Frederich Balding, ED scribe. This patient was seen in room TR06C/TR06C and the patient's care was started at 9:54 PM.     Chief Complaint  Patient presents with  . Shoulder Injury   The history is provided by the patient. No language interpreter was used.   HPI Comments: [name] is a 38 y.o. male who presents to the Emergency Department complaining of right shoulder pain that started 2 days ago after sleeping on his shoulder wrong. Reports lifting a case of water bottles earlier today and hearing a pop. Movement worsens pain. Pt has taken ibuprofen with no relief. His last dialysis was earlier today. Pt has seen [name] in the past for a left great toe amputation.   Past Medical History  Diagnosis Date  . Diabetes mellitus   . Renal disorder dialysis    [name]   Past Surgical History  Procedure Laterality Date  . Av fistula placement  left arm  . Amputation Left 09/27/2013    Procedure: LEFT GREAT TOE AMPUTATION;  Surgeon: [name], MD;  Location: [place];  Service: Orthopedics;  Laterality: Left;   No family history on file. History  Substance Use Topics  . Smoking status: Current Every Day Smoker  . Smokeless tobacco: Not on file  . Alcohol Use: Yes    Review of Systems  Musculoskeletal: Positive for arthralgias.  All other systems reviewed and are negative.  Allergies  Review of patient's allergies indicates no known allergies.  Home Medications   Prior to Admission medications   Medication Sig Start Date End Date Taking? Authorizing Provider  amLODipine (NORVASC) 5 MG tablet Take 1 tablet (5 mg total) by mouth at bedtime. 08/08/13   Velvet Bathe, MD  aspirin EC 325 MG tablet Take 325 mg by mouth every 6 (six) hours as needed for mild pain.    Historical Provider, MD   calcium acetate (PHOSLO) 667 MG capsule Take 3 capsules (2,001 mg total) by mouth 3 (three) times daily with meals. 08/08/13   Velvet Bathe, MD  insulin glargine (LANTUS) 100 UNIT/ML injection Inject 0.13 mLs (13 Units total) into the skin at bedtime. 08/08/13   Velvet Bathe, MD  lanthanum (FOSRENOL) 1000 MG chewable tablet Chew 1 tablet (1,000 mg total) by mouth 3 (three) times daily with meals. 08/08/13   Velvet Bathe, MD  oxyCODONE-acetaminophen (ROXICET) 5-325 MG per tablet Take 1 tablet by mouth every 4 (four) hours as needed for severe pain. 09/27/13   [name], MD   BP 165/95  Pulse 87  Temp(Src) 98.4 F (36.9 C) (Oral)  Resp 18  Wt 190 lb 7 oz (86.382 kg)  SpO2 98%  Physical Exam  Nursing note and vitals reviewed. Constitutional: He is oriented to person, place, and time. He appears well-developed and well-nourished. No distress.  HENT:  Head: Normocephalic.  Eyes: Conjunctivae and EOM are normal.  Cardiovascular: Normal rate.   Pulmonary/Chest: Effort normal. No stridor.  Musculoskeletal: Normal range of motion.  Left AC fistula with palpable thrill. Right shoulder not able to abduct greater than 90 degrees, drop arm is negative. Pt is tender to palpation along anterior rotator cuff musculature. Distally neurovascularly intact.   Neurological: He is alert and oriented to person, place, and time.  Psychiatric: He has a normal mood  and affect.    ED Course  Procedures (including critical care time)  DIAGNOSTIC STUDIES: Oxygen Saturation is 98% on RA, normal by my interpretation.    COORDINATION OF CARE: 9:56 PM-Advised pt of xray results. Discussed treatment plan which includes pain medication with pt at bedside and pt agreed to plan. Advised pt to call [name] tomorrow and make a follow up appointment.   Labs Review Labs Reviewed - No data to display  Imaging Review Dg Shoulder Right  05/06/2014   CLINICAL DATA:  Right shoulder injury/pain  EXAM: RIGHT SHOULDER  - 2+ VIEW  COMPARISON:  None.  FINDINGS: No fracture or dislocation is seen.  The joint spaces are preserved.  The visualized soft tissues are unremarkable.  IMPRESSION: No fracture or dislocation is seen.   Electronically Signed   By: Julian Hy M.D.   On: 05/06/2014 21:42     EKG Interpretation None      MDM   Final diagnoses:  Dysfunction of right rotator cuff    Filed Vitals:   05/06/14 2049 05/06/14 2207  BP: 165/95 134/65  Pulse: 87 80  Temp: 98.4 F (36.9 C)   TempSrc: Oral   Resp: 18 15  Weight: 190 lb 7 oz (86.382 kg)   SpO2: 98% 99%    Medications  HYDROcodone-acetaminophen (NORCO/VICODIN) 5-325 MG per tablet 1 tablet (1 tablet Oral Given 05/06/14 2206)  cyclobenzaprine (FLEXERIL) tablet 5 mg (5 mg Oral Given 05/06/14 2206)    [name] Labella Sr. is a 38 y.o. male presenting with right shoulder arthralgia after lifting a heavy case of water in hearing a pop. Patient is not able to abduct greater than 90. He has tenderness palpation along the rotator cuff musculature. X-ray with no bony abnormality. Likely rotator cuff tear. Patient will be given a sling, advised to use it intermittently to prevent frozen shoulder. Will help control pain with Robaxin and Norco. I've advised patient that he will need to follow with his orthopedist, [name] for further evaluation.  Evaluation does not show pathology that would require ongoing emergent intervention or inpatient treatment. Pt is hemodynamically stable and mentating appropriately. Discussed findings and plan with patient/guardian, who agrees with care plan. All questions answered. Return precautions discussed and outpatient follow up given.   Discharge Medication List as of 05/06/2014 10:02 PM    START taking these medications   Details  HYDROcodone-acetaminophen (NORCO/VICODIN) 5-325 MG per tablet Take 1 tablets by mouth every 6 hours as needed for pain., Print    methocarbamol (ROBAXIN) 500 MG tablet Take 1  tablet (500 mg total) by mouth 2 (two) times daily as needed for muscle spasms., Starting 05/06/2014, Until Discontinued, Print           I personally performed the services described in this documentation, which was scribed in my presence. The recorded information has been reviewed and is accurate.  Monico Blitz, PA-C 05/06/14 2337

## 2014-05-06 NOTE — Discharge Instructions (Signed)
Only use the arm sling for up to 2 days. Take the arm out and rotate the shoulder every 4 hours.   For breakthrough pain you may take Robaxin and or Vicodin. Do not drink alcohol, drive or operate heavy machinery when taking Robaxin and or vicodin.  Please follow with your primary care doctor in the next 2 days for a check-up. They must obtain records for further management.   Do not hesitate to return to the Emergency Department for any new, worsening or concerning symptoms.   Rotator Cuff Tendinitis  Rotator cuff tendinitis is inflammation of the tough, cord-like bands that connect muscle to bone (tendons) in your rotator cuff. Your rotator cuff is the collection of all the muscles and tendons that connect your arm to your shoulder. Your rotator cuff holds the head of your upper arm bone (humerus) in the cup (fossa) of your shoulder blade (scapula). CAUSES Rotator cuff tendinitis is usually caused by overusing the joint involved.  SIGNS AND SYMPTOMS  Deep ache in the shoulder also felt on the outside upper arm over the shoulder muscle.  Point tenderness over the area that is injured.  Pain comes on gradually and becomes worse with lifting the arm to the side (abduction) or turning it inward (internal rotation).  May lead to a chronic tear: When a rotator cuff tendon becomes inflamed, it runs the risk of losing its blood supply, causing some tendon fibers to die. This increases the risk that the tendon can fray and partially or completely tear. DIAGNOSIS Rotator cuff tendinitis is diagnosed by taking a medical history, performing a physical exam, and reviewing results of imaging exams. The medical history is useful to help determine the type of rotator cuff injury. The physical exam will include looking at the injured shoulder, feeling the injured area, and watching you do range-of-motion exercises. X-ray exams are typically done to rule out other causes of shoulder pain, such as fractures.  MRI is the imaging exam usually used for significant shoulder injuries. Sometimes a dye study called CT arthrogram is done, but it is not as widely used as MRI. In some institutions, special ultrasound tests may also be used to aid in the diagnosis. TREATMENT  Less Severe Cases  Use of a sling to rest the shoulder for a short period of time. Prolonged use of the sling can cause stiffness, weakness, and loss of motion of the shoulder joint.  Anti-inflammatory medicines, such as ibuprofen or naproxen sodium, may be prescribed. More Severe Cases  Physical therapy.  Use of steroid injections into the shoulder joint.  Surgery. HOME CARE INSTRUCTIONS   Use a sling or splint until the pain decreases. Prolonged use of the sling can cause stiffness, weakness, and loss of motion of the shoulder joint.  Apply ice to the injured area:  Put ice in a plastic bag.  Place a towel between your skin and the bag.  Leave the ice on for 20 minutes, 2-3 times a day.  Try to avoid use other than gentle range of motion while your shoulder is painful. Use the shoulder and exercise only as directed by your health care provider. Stop exercises or range of motion if pain or discomfort increases, unless directed otherwise by your health care provider.  Only take over-the-counter or prescription medicines for pain, discomfort, or fever as directed by your health care provider.  If you were given a shoulder sling and straps (immobilizer), do not remove it except as directed, or until you see  a health care provider for a follow-up exam. If you need to remove it, move your arm as little as possible or as directed.  You may want to sleep on several pillows at night to lessen swelling and pain. SEEK IMMEDIATE MEDICAL CARE IF:   Your shoulder pain increases or new pain develops in your arm, hand, or fingers and is not relieved with medicines.  You have new, unexplained symptoms, especially increased numbness in the  hands or loss of strength.  You develop any worsening of the problems that brought you in for care.  Your arm, hand, or fingers are numb or tingling.  Your arm, hand, or fingers are swollen, painful, or turn white or blue. MAKE SURE YOU:  Understand these instructions.  Will watch your condition.  Will get help right away if you are not doing well or get worse. Document Released: 10/23/2003 Document Revised: 05/23/2013 Document Reviewed: 03/14/2013 [facility] Patient Information 2015 [place], [place]. This information is not intended to replace advice given to you by your health care provider. Make sure you discuss any questions you have with your health care provider.

## 2014-05-08 NOTE — ED Provider Notes (Signed)
Medical screening examination/treatment/procedure(s) were performed by non-physician practitioner and as supervising physician I was immediately available for consultation/collaboration.   EKG Interpretation None        Debby Freiberg, MD 05/08/14 2008

## 2014-05-21 ENCOUNTER — Telehealth (HOSPITAL_COMMUNITY): Payer: Self-pay

## 2014-05-21 NOTE — Telephone Encounter (Signed)
Encounter complete. 

## 2014-05-23 ENCOUNTER — Encounter (HOSPITAL_COMMUNITY): Payer: Medicaid Other

## 2014-05-24 ENCOUNTER — Telehealth (HOSPITAL_COMMUNITY): Payer: Self-pay | Admitting: *Deleted

## 2014-05-28 ENCOUNTER — Encounter: Payer: Medicaid Other | Admitting: Physician Assistant

## 2014-05-29 ENCOUNTER — Telehealth (HOSPITAL_COMMUNITY): Payer: Self-pay | Admitting: *Deleted

## 2014-09-12 ENCOUNTER — Emergency Department (HOSPITAL_COMMUNITY): Payer: Medicaid Other

## 2014-09-12 ENCOUNTER — Inpatient Hospital Stay (HOSPITAL_COMMUNITY)
Admission: EM | Admit: 2014-09-12 | Discharge: 2014-09-15 | DRG: 286 | Disposition: A | Discharge status: Home / Self Care | Payer: Medicaid Other | Attending: Internal Medicine | Admitting: Internal Medicine

## 2014-09-12 ENCOUNTER — Encounter (HOSPITAL_COMMUNITY): Payer: Self-pay | Admitting: *Deleted

## 2014-09-12 DIAGNOSIS — Z992 Dependence on renal dialysis: Secondary | ICD-10-CM | POA: Diagnosis not present

## 2014-09-12 DIAGNOSIS — J9811 Atelectasis: Secondary | ICD-10-CM | POA: Diagnosis present

## 2014-09-12 DIAGNOSIS — R079 Chest pain, unspecified: Secondary | ICD-10-CM | POA: Diagnosis present

## 2014-09-12 DIAGNOSIS — I429 Cardiomyopathy, unspecified: Secondary | ICD-10-CM | POA: Diagnosis present

## 2014-09-12 DIAGNOSIS — Z794 Long term (current) use of insulin: Secondary | ICD-10-CM | POA: Diagnosis not present

## 2014-09-12 DIAGNOSIS — Z7982 Long term (current) use of aspirin: Secondary | ICD-10-CM

## 2014-09-12 DIAGNOSIS — R072 Precordial pain: Secondary | ICD-10-CM

## 2014-09-12 DIAGNOSIS — I120 Hypertensive chronic kidney disease with stage 5 chronic kidney disease or end stage renal disease: Secondary | ICD-10-CM | POA: Diagnosis present

## 2014-09-12 DIAGNOSIS — E109 Type 1 diabetes mellitus without complications: Secondary | ICD-10-CM | POA: Diagnosis present

## 2014-09-12 DIAGNOSIS — D638 Anemia in other chronic diseases classified elsewhere: Secondary | ICD-10-CM | POA: Diagnosis present

## 2014-09-12 DIAGNOSIS — N2581 Secondary hyperparathyroidism of renal origin: Secondary | ICD-10-CM | POA: Diagnosis present

## 2014-09-12 DIAGNOSIS — I2510 Atherosclerotic heart disease of native coronary artery without angina pectoris: Secondary | ICD-10-CM | POA: Diagnosis present

## 2014-09-12 DIAGNOSIS — F17210 Nicotine dependence, cigarettes, uncomplicated: Secondary | ICD-10-CM | POA: Diagnosis present

## 2014-09-12 DIAGNOSIS — I10 Essential (primary) hypertension: Secondary | ICD-10-CM | POA: Diagnosis present

## 2014-09-12 DIAGNOSIS — N186 End stage renal disease: Secondary | ICD-10-CM

## 2014-09-12 DIAGNOSIS — I5042 Chronic combined systolic (congestive) and diastolic (congestive) heart failure: Principal | ICD-10-CM | POA: Diagnosis present

## 2014-09-12 DIAGNOSIS — I214 Non-ST elevation (NSTEMI) myocardial infarction: Secondary | ICD-10-CM | POA: Diagnosis present

## 2014-09-12 DIAGNOSIS — R778 Other specified abnormalities of plasma proteins: Secondary | ICD-10-CM | POA: Insufficient documentation

## 2014-09-12 DIAGNOSIS — J90 Pleural effusion, not elsewhere classified: Secondary | ICD-10-CM | POA: Diagnosis present

## 2014-09-12 DIAGNOSIS — E119 Type 2 diabetes mellitus without complications: Secondary | ICD-10-CM

## 2014-09-12 DIAGNOSIS — R7989 Other specified abnormal findings of blood chemistry: Secondary | ICD-10-CM | POA: Insufficient documentation

## 2014-09-12 HISTORY — DX: Essential (primary) hypertension: I10

## 2014-09-12 LAB — CBC WITH DIFFERENTIAL/PLATELET
BASOS ABS: 0 10*3/uL (ref 0.0–0.1)
BASOS PCT: 0 % (ref 0–1)
Eosinophils Absolute: 0.2 10*3/uL (ref 0.0–0.7)
Eosinophils Relative: 4 % (ref 0–5)
HCT: 27.2 % — ABNORMAL LOW (ref 39.0–52.0)
HEMOGLOBIN: 8.9 g/dL — AB (ref 13.0–17.0)
LYMPHS PCT: 44 % (ref 12–46)
Lymphs Abs: 2.4 10*3/uL (ref 0.7–4.0)
MCH: 26.5 pg (ref 26.0–34.0)
MCHC: 32.7 g/dL (ref 30.0–36.0)
MCV: 81 fL (ref 78.0–100.0)
MONOS PCT: 7 % (ref 3–12)
Monocytes Absolute: 0.4 10*3/uL (ref 0.1–1.0)
NEUTROS PCT: 45 % (ref 43–77)
Neutro Abs: 2.4 10*3/uL (ref 1.7–7.7)
Platelets: 157 10*3/uL (ref 150–400)
RBC: 3.36 MIL/uL — ABNORMAL LOW (ref 4.22–5.81)
RDW: 14 % (ref 11.5–15.5)
WBC: 5.4 10*3/uL (ref 4.0–10.5)

## 2014-09-12 LAB — BASIC METABOLIC PANEL
ANION GAP: 11 (ref 5–15)
Anion gap: 9 (ref 5–15)
BUN: 10 mg/dL (ref 6–23)
BUN: 16 mg/dL (ref 6–23)
CHLORIDE: 96 mmol/L (ref 96–112)
CO2: 30 mmol/L (ref 19–32)
CO2: 32 mmol/L (ref 19–32)
CREATININE: 9.35 mg/dL — AB (ref 0.50–1.35)
Calcium: 8.6 mg/dL (ref 8.4–10.5)
Calcium: 9.4 mg/dL (ref 8.4–10.5)
Chloride: 97 mmol/L (ref 96–112)
Creatinine, Ser: 7.84 mg/dL — ABNORMAL HIGH (ref 0.50–1.35)
GFR calc Af Amer: 9 mL/min — ABNORMAL LOW (ref >90)
GFR calc Af Amer: 7 mL/min — ABNORMAL LOW (ref >90)
GFR calc non Af Amer: 6 mL/min — ABNORMAL LOW (ref >90)
GFR, EST NON AFRICAN AMERICAN: 8 mL/min — AB (ref >90)
Glucose, Bld: 156 mg/dL — ABNORMAL HIGH (ref 70–99)
Glucose, Bld: 139 mg/dL — ABNORMAL HIGH (ref 70–99)
POTASSIUM: 3.7 mmol/L (ref 3.5–5.1)
Potassium: 2.8 mmol/L — ABNORMAL LOW (ref 3.5–5.1)
SODIUM: 136 mmol/L (ref 135–145)
SODIUM: 139 mmol/L (ref 135–145)

## 2014-09-12 LAB — VITAMIN B12: Vitamin B-12: 630 pg/mL (ref 211–911)

## 2014-09-12 LAB — GLUCOSE, CAPILLARY
GLUCOSE-CAPILLARY: 143 mg/dL — AB (ref 70–99)
GLUCOSE-CAPILLARY: 121 mg/dL — AB (ref 70–99)
Glucose-Capillary: 132 mg/dL — ABNORMAL HIGH (ref 70–99)

## 2014-09-12 LAB — OCCULT BLOOD X 1 CARD TO LAB, STOOL: FECAL OCCULT BLD: NEGATIVE

## 2014-09-12 LAB — MRSA PCR SCREENING: MRSA by PCR: POSITIVE — AB

## 2014-09-12 LAB — RETICULOCYTES
RBC.: 3.66 MIL/uL — ABNORMAL LOW (ref 4.22–5.81)
RETIC CT PCT: 0.6 % (ref 0.4–3.1)
Retic Count, Absolute: 22 10*3/uL (ref 19.0–186.0)

## 2014-09-12 LAB — IRON AND TIBC
Iron: 76 ug/dL (ref 42–165)
SATURATION RATIOS: 47 % (ref 20–55)
TIBC: 161 ug/dL — ABNORMAL LOW (ref 215–435)
UIBC: 85 ug/dL — AB (ref 125–400)

## 2014-09-12 LAB — FOLATE: Folate: 10.9 ng/mL

## 2014-09-12 LAB — TROPONIN I
TROPONIN I: 0.2 ng/mL — AB (ref <0.031)
TROPONIN I: 0.15 ng/mL — AB (ref <0.031)
Troponin I: 0.15 ng/mL — ABNORMAL HIGH (ref <0.031)

## 2014-09-12 LAB — FERRITIN: FERRITIN: 522 ng/mL — AB (ref 22–322)

## 2014-09-12 MED ORDER — CALCIUM ACETATE 667 MG PO CAPS
2001.0000 mg | ORAL_CAPSULE | Freq: Three times a day (TID) | ORAL | Status: DC
  Filled 2014-09-12 (×12): qty 3

## 2014-09-12 MED ORDER — POTASSIUM CHLORIDE 10 MEQ/100ML IV SOLN
10.0000 meq | Freq: Once | INTRAVENOUS | Status: AC
  Administered 2014-09-12: 10 meq via INTRAVENOUS
  Filled 2014-09-12: qty 100

## 2014-09-12 MED ORDER — MUPIROCIN 2 % EX OINT
TOPICAL_OINTMENT | Freq: Two times a day (BID) | CUTANEOUS | Status: DC
  Administered 2014-09-12 – 2014-09-13 (×3): via NASAL
  Administered 2014-09-14 (×2): 1 via NASAL
  Administered 2014-09-15: 10:00:00 via NASAL
  Filled 2014-09-12 (×3): qty 22

## 2014-09-12 MED ORDER — PANTOPRAZOLE SODIUM 40 MG PO TBEC
40.0000 mg | DELAYED_RELEASE_TABLET | Freq: Every day | ORAL | Status: DC
  Administered 2014-09-12 – 2014-09-14 (×3): 40 mg via ORAL
  Filled 2014-09-12 (×3): qty 1

## 2014-09-12 MED ORDER — ALPRAZOLAM 0.25 MG PO TABS
2.0000 mg | ORAL_TABLET | Freq: Once | ORAL | Status: DC
Start: 2014-09-12 — End: 2014-09-12

## 2014-09-12 MED ORDER — NITROGLYCERIN 0.4 MG SL SUBL
0.4000 mg | SUBLINGUAL_TABLET | SUBLINGUAL | Status: AC | PRN
  Administered 2014-09-12 (×3): 0.4 mg via SUBLINGUAL
  Filled 2014-09-12 (×5): qty 1

## 2014-09-12 MED ORDER — INSULIN GLARGINE 100 UNIT/ML ~~LOC~~ SOLN
13.0000 [IU] | Freq: Every day | SUBCUTANEOUS | Status: DC
  Administered 2014-09-12 – 2014-09-14 (×3): 13 [IU] via SUBCUTANEOUS
  Filled 2014-09-12 (×4): qty 0.13

## 2014-09-12 MED ORDER — ACETAMINOPHEN 325 MG PO TABS
650.0000 mg | ORAL_TABLET | ORAL | Status: DC | PRN
  Administered 2014-09-12 – 2014-09-13 (×2): 650 mg via ORAL
  Filled 2014-09-12 (×2): qty 2

## 2014-09-12 MED ORDER — METOPROLOL TARTRATE 12.5 MG HALF TABLET
12.5000 mg | ORAL_TABLET | Freq: Two times a day (BID) | ORAL | Status: DC
  Administered 2014-09-12 – 2014-09-13 (×3): 12.5 mg via ORAL
  Filled 2014-09-12 (×3): qty 1

## 2014-09-12 MED ORDER — METOPROLOL TARTRATE 25 MG PO TABS: 25.0000 mg | ORAL_TABLET | Freq: Two times a day (BID) | ORAL | Status: DC

## 2014-09-12 MED ORDER — GI COCKTAIL ~~LOC~~: 30.0000 mL | Freq: Four times a day (QID) | ORAL | Status: DC | PRN

## 2014-09-12 MED ORDER — ATORVASTATIN CALCIUM 80 MG PO TABS
80.0000 mg | ORAL_TABLET | Freq: Every day | ORAL | Status: DC
  Administered 2014-09-12 – 2014-09-13 (×2): 80 mg via ORAL
  Filled 2014-09-12 (×2): qty 1

## 2014-09-12 MED ORDER — INSULIN ASPART 100 UNIT/ML ~~LOC~~ SOLN: 0.0000 [IU] | Freq: Three times a day (TID) | SUBCUTANEOUS | Status: DC

## 2014-09-12 MED ORDER — ASPIRIN 81 MG PO CHEW
81.0000 mg | CHEWABLE_TABLET | Freq: Every day | ORAL | Status: DC
  Administered 2014-09-12 – 2014-09-14 (×3): 81 mg via ORAL
  Filled 2014-09-12 (×3): qty 1

## 2014-09-12 MED ORDER — HEPARIN BOLUS VIA INFUSION
4000.0000 [IU] | Freq: Once | INTRAVENOUS | Status: AC
  Administered 2014-09-12: 4000 [IU] via INTRAVENOUS
  Filled 2014-09-12: qty 4000

## 2014-09-12 MED ORDER — HEPARIN (PORCINE) IN NACL 100-0.45 UNIT/ML-% IJ SOLN
1000.0000 [IU]/h | INTRAMUSCULAR | Status: DC
  Administered 2014-09-12: 1000 [IU]/h via INTRAVENOUS
  Filled 2014-09-12: qty 250

## 2014-09-12 MED ORDER — LANTHANUM CARBONATE 500 MG PO CHEW
1000.0000 mg | CHEWABLE_TABLET | Freq: Three times a day (TID) | ORAL | Status: DC
  Administered 2014-09-12 (×2): 1000 mg via ORAL
  Filled 2014-09-12 (×5): qty 2

## 2014-09-12 MED ORDER — AMLODIPINE BESYLATE 5 MG PO TABS
5.0000 mg | ORAL_TABLET | Freq: Every day | ORAL | Status: DC
  Administered 2014-09-12 – 2014-09-14 (×3): 5 mg via ORAL
  Filled 2014-09-12 (×3): qty 1

## 2014-09-12 MED ORDER — MORPHINE SULFATE 2 MG/ML IJ SOLN
2.0000 mg | INTRAMUSCULAR | Status: DC | PRN
Start: 2014-09-12 — End: 2014-09-15
  Administered 2014-09-13 (×3): 2 mg via INTRAVENOUS
  Filled 2014-09-12 (×4): qty 1

## 2014-09-12 MED ORDER — CHLORHEXIDINE GLUCONATE CLOTH 2 % EX PADS
6.0000 | MEDICATED_PAD | Freq: Every day | CUTANEOUS | Status: DC
  Administered 2014-09-13 – 2014-09-14 (×2): 6 via TOPICAL

## 2014-09-12 MED ORDER — ONDANSETRON HCL 4 MG/2ML IJ SOLN: 4.0000 mg | Freq: Four times a day (QID) | INTRAMUSCULAR | Status: DC | PRN

## 2014-09-12 NOTE — Progress Notes (Signed)
ANTICOAGULATION CONSULT NOTE - Initial Consult  Pharmacy Consult for Heparin Indication: chest pain/ACS  No Known Allergies  Patient Measurements:   Heparin Dosing Weight: 85 kg  Vital Signs: Temp: 98.3 F (36.8 C) (01/28 0205) Temp Source: Oral (01/28 0205) BP: 199/105 mmHg (01/28 0500) Pulse Rate: 89 (01/28 0500)  Labs:  Recent Labs  09/12/14 0301  HGB 8.9*  HCT 27.2*  PLT 157  CREATININE 7.84*  TROPONINI 0.20*    CrCl cannot be calculated (Unknown ideal weight.).   Medical History: Past Medical History  Diagnosis Date  . Diabetes mellitus   . Renal disorder dialysis    MWF [facility]    Medications:  Norvasc  PhosLo  Norco  Lantus  Fosrenol  ASA  Robaxin  Percocet    Assessment: 38 to male with chest pain for heparin  Goal of Therapy:  Heparin level 0.3-0.7 units/ml Monitor platelets by anticoagulation protocol: Yes   Plan: Heparin 4000 units IV bolus, then start heparin 1000 units/hr Check heparin level in 8 hours.   Lynette Noah, Bronson Curb 09/12/2014,5:31 AM

## 2014-09-12 NOTE — ED Provider Notes (Signed)
CSN: [id number]     Arrival date & time 09/12/14  0154 History  This chart was scribed for non-physician practitioner working with Delora Fuel, MD by Mercy Moore, ED Scribe. This patient was seen in room D30C/D30C and the patient's care was started at 2:46 AM.   Chief Complaint  Patient presents with  . Chest Pain   The history is provided by the patient. No language interpreter was used.   HPI Comments: [name] Eiben Sr. is a 39 y.o. male with PMHx of Diabetes Mellitus and renal disorder brought in by ambulance, who presents to the Emergency Department complaining of intermittent, sharp chest pain for three days. Patient additionally complains of elevated BP, nausea and shortness of breath. Patient states that at dialysis today his blood pressure was 120/75. Patient reports that his chest pain episodes last for approximately one hour. Tonight he reports an episode at 12am while laying down watching television. Patient locates pain in his left lower chest and describes pain as feeling like he has food stuck in his abdomen. Patient denies radiation of pain. Patient reports shortness of breath with exertion, nausea, vomiting and diaphoresis.  Patient denies history of chest pain. Patient reports alleviation of his chest pain with the two nitroglycerin pills given to him in the ambulance. Patient reports 7/10 pain before receiving the pills and 5/10 currently. Patient also given 4 aspirin. Patient reports cold symptoms recently and states has been unable to eat. Patient is an admitted smoker.  Patient shares seeing a cardiologist, where he had a stress test, years ago. He has not visit the cardiologist since. Patient shares family history of CHF in his mother.      Past Medical History  Diagnosis Date  . Diabetes mellitus   . Renal disorder dialysis    [name]   Past Surgical History  Procedure Laterality Date  . Av fistula placement  left arm  . Amputation Left 09/27/2013   Procedure: LEFT GREAT TOE AMPUTATION;  Surgeon: [name], MD;  Location: [place];  Service: Orthopedics;  Laterality: Left;   History reviewed. No pertinent family history. History  Substance Use Topics  . Smoking status: Current Every Day Smoker  . Smokeless tobacco: Never Used  . Alcohol Use: Yes    Review of Systems  Constitutional: Positive for diaphoresis. Negative for fever and chills.  Respiratory: Positive for shortness of breath.   Cardiovascular: Positive for chest pain.  Gastrointestinal: Positive for nausea.   Allergies  Review of patient's allergies indicates no known allergies.  Home Medications   Prior to Admission medications   Medication Sig Start Date End Date Taking? Authorizing Provider  amLODipine (NORVASC) 5 MG tablet Take 1 tablet (5 mg total) by mouth at bedtime. 08/08/13   Velvet Bathe, MD  aspirin EC 325 MG tablet Take 325 mg by mouth every 6 (six) hours as needed for mild pain.    Historical Provider, MD  calcium acetate (PHOSLO) 667 MG capsule Take 3 capsules (2,001 mg total) by mouth 3 (three) times daily with meals. 08/08/13   Velvet Bathe, MD  HYDROcodone-acetaminophen (NORCO/VICODIN) 5-325 MG per tablet Take 1 tablets by mouth every 6 hours as needed for pain. 05/06/14   Nicole Pisciotta, PA-C  insulin glargine (LANTUS) 100 UNIT/ML injection Inject 0.13 mLs (13 Units total) into the skin at bedtime. 08/08/13   Velvet Bathe, MD  lanthanum (FOSRENOL) 1000 MG chewable tablet Chew 1 tablet (1,000 mg total) by mouth 3 (three) times daily with  meals. 08/08/13   Velvet Bathe, MD  methocarbamol (ROBAXIN) 500 MG tablet Take 1 tablet (500 mg total) by mouth 2 (two) times daily as needed for muscle spasms. 05/06/14   Nicole Pisciotta, PA-C  oxyCODONE-acetaminophen (ROXICET) 5-325 MG per tablet Take 1 tablet by mouth every 4 (four) hours as needed for severe pain. 09/27/13   [name], MD   Triage Vitals: BP 190/93 mmHg  Pulse 85  Temp(Src) 98.3 F (36.8 C)  (Oral)  Resp 18  SpO2 95% Physical Exam  Constitutional: He is oriented to person, place, and time. He appears well-developed and well-nourished. No distress.  HENT:  Head: Normocephalic and atraumatic.  Eyes: EOM are normal. Pupils are equal, round, and reactive to light.  Neck: Normal range of motion. Neck supple. No JVD present.  Cardiovascular: Normal rate, regular rhythm and normal heart sounds.   No murmur heard. Pulmonary/Chest: Effort normal and breath sounds normal. No respiratory distress. He has no wheezes. He has no rales.  1/6 systolic ejection murmur at the base.   Abdominal: Soft. Bowel sounds are normal. He exhibits no distension and no mass. There is no tenderness.  Mild epigastric tenderness.   Musculoskeletal: Normal range of motion. He exhibits no edema.  AV fistula present left upper arm with thrill present.   Lymphadenopathy:    He has no cervical adenopathy.  Neurological: He is alert and oriented to person, place, and time. He has normal reflexes. No cranial nerve deficit. Coordination normal.  Skin: Skin is warm and dry. No rash noted.  Psychiatric: He has a normal mood and affect. His behavior is normal. Thought content normal.  Nursing note and vitals reviewed.   ED Course  Procedures (including critical care time)  COORDINATION OF CARE: 3:00 AM- Discussed treatment plan with patient at bedside and patient agreed to plan.   Labs Review Results for orders placed or performed during the hospital encounter of 09/12/14  CBC with Differential  Result Value Ref Range   WBC 5.4 4.0 - 10.5 K/uL   RBC 3.36 (L) 4.22 - 5.81 MIL/uL   Hemoglobin 8.9 (L) 13.0 - 17.0 g/dL   HCT 27.2 (L) 39.0 - 52.0 %   MCV 81.0 78.0 - 100.0 fL   MCH 26.5 26.0 - 34.0 pg   MCHC 32.7 30.0 - 36.0 g/dL   RDW 14.0 11.5 - 15.5 %   Platelets 157 150 - 400 K/uL   Neutrophils Relative % 45 43 - 77 %   Neutro Abs 2.4 1.7 - 7.7 K/uL   Lymphocytes Relative 44 12 - 46 %   Lymphs Abs 2.4  0.7 - 4.0 K/uL   Monocytes Relative 7 3 - 12 %   Monocytes Absolute 0.4 0.1 - 1.0 K/uL   Eosinophils Relative 4 0 - 5 %   Eosinophils Absolute 0.2 0.0 - 0.7 K/uL   Basophils Relative 0 0 - 1 %   Basophils Absolute 0.0 0.0 - 0.1 K/uL  Basic metabolic panel  Result Value Ref Range   Sodium 136 135 - 145 mmol/L   Potassium 2.8 (L) 3.5 - 5.1 mmol/L   Chloride 97 96 - 112 mmol/L   CO2 30 19 - 32 mmol/L   Glucose, Bld 156 (H) 70 - 99 mg/dL   BUN 10 6 - 23 mg/dL   Creatinine, Ser 7.84 (H) 0.50 - 1.35 mg/dL   Calcium 8.6 8.4 - 10.5 mg/dL   GFR calc non Af Amer 8 (L) >90 mL/min   GFR  calc Af Amer 9 (L) >90 mL/min   Anion gap 9 5 - 15  Troponin I  Result Value Ref Range   Troponin I 0.20 (H) <0.031 ng/mL    Imaging Review Dg Chest Port 1 View  09/12/2014   CLINICAL DATA:  Left chest pain and shortness of breath. Dialysis for 9 years.  EXAM: PORTABLE CHEST - 1 VIEW  COMPARISON:  09/27/2013  FINDINGS: Mild cardiac enlargement without significant vascular congestion. Infiltration or atelectasis in both lung bases. Probable small left pleural effusion. No pneumothorax. Mediastinal contours appear intact.  IMPRESSION: Infiltration or atelectasis in both lung bases with small left pleural effusion.   Electronically Signed   By: Lucienne Capers M.D.   On: 09/12/2014 03:24     Date: 09/12/2014  Rate: 89  Rhythm: normal sinus rhythm  QRS Axis: normal  Intervals: QT prolonged  ST/T Wave abnormalities: nonspecific T wave changes  Conduction Disutrbances:none  Narrative Interpretation: Left ventricular hypertrophy with T-wave flattening in the anterolateral and inferior leads. When compared with ECG of 10/07/2013, nonspecific T-wave changes are now present.  Old EKG Reviewed: changes noted    MDM   Final diagnoses:  Chest pain, unspecified chest pain type  Elevated troponin  Essential hypertension  End stage renal disease on dialysis    Chest pain worrisome for cardiac etiology. He did  have partial relief with nitroglycerin and does have risk factors of tobacco abuse, hypertension, diabetes. ECG shows some nonspecific T-wave changes which are new compared with ECG from 1 year ago. He had been given aspirin in the ambulance. He was given additional nitroglycerin and following this he fell asleep. Troponin is come back positive at 0.2 and he is placed on a heparin drip. Case was discussed with Dr. Stanford Breed of cardiology service who felt that he should be admitted under the medicine service and cardiology would see him in consultation. Case was discussed with Dr. Algis Liming of internal medicine service who agrees to admit the patient. Old records are reviewed and I do not see any evidence of prior cardiology testing although patient states he did have a stress test several years ago.  I personally performed the services described in this documentation, which was scribed in my presence. The recorded information has been reviewed and is accurate.    Delora Fuel, MD [id number]

## 2014-09-12 NOTE — H&P (Signed)
Date: 09/12/2014               Patient Name:  [name] Lare Sr. MRN: [id number]  DOB: 09-May-1976 Age / Sex: 39 y.o., male   PCP: Placido Sou, MD         Medical Service: Internal Medicine Teaching Service         Attending Physician: Dr. Aldine Contes, MD    First Contact: Dr. Sherrine Maples Pager: [phone]  Second Contact: Dr. Ronnald Ramp Pager: [phone]       After Hours (After 5p/  First Contact Pager: [phone]  weekends / holidays): Second Contact Pager: [phone]   Chief Complaint: Chest pain  History of Present Illness: [name] is a 39 yo man pmh ESRD on HD x9 years 2/2 diabetes, DM, and HTN presents with worsening left sided chest pain. Pt states the pain is sharp in nature at its worse 7-8/10 and not provoked by movement or activity and not relieved by anything including rest, although the pain was relieved with NTG in the ED. Pt states this chest pain started about 3 days ago in the same left sided chest area that was sharp in nature w/o radiation and associated with nausea and some diaphoresis and SOB but no vomiting. He originally thought it was indigestion but it didn't get better with antacids. He is a smoker 1/2ppd for the past 20 years (10 pack year) and his mother had CHF and was also on HD before her death. He denied every having this type of pain before but has had recent stress tests and was due for another repeat in 2015 but was unable to make that appointment. He doesn't take an ASA or HLD meds. He has been compliant with his HD sessions and has not any complications but thinks for the past sessions he has been hypertensive which is new for him and recently started back on his meds. His SM has been under good control without any changes. He denied any abdominal pain, fever/chills, DOE, rashes, PND or LE swelling.   Meds: Current Facility-Administered Medications  Medication Dose Route Frequency Provider Last Rate Last Dose  . acetaminophen (TYLENOL) tablet 650 mg   650 mg Oral Q4H PRN Clinton Gallant, MD      . amLODipine (NORVASC) tablet 5 mg  5 mg Oral QHS Clinton Gallant, MD      . aspirin chewable tablet 81 mg  81 mg Oral Daily Lelon Perla, MD      . atorvastatin (LIPITOR) tablet 80 mg  80 mg Oral q1800 Lelon Perla, MD      . calcium acetate (PHOSLO) capsule 2,001 mg  2,001 mg Oral TID WC Clinton Gallant, MD      . gi cocktail (Maalox,Lidocaine,Donnatal)  30 mL Oral QID PRN Clinton Gallant, MD      . insulin aspart (novoLOG) injection 0-9 Units  0-9 Units Subcutaneous TID WC Clinton Gallant, MD      . insulin glargine (LANTUS) injection 13 Units  13 Units Subcutaneous QHS Clinton Gallant, MD      . lanthanum [facility]) chewable tablet 1,000 mg  1,000 mg Oral TID WC Clinton Gallant, MD      . metoprolol tartrate (LOPRESSOR) tablet 12.5 mg  12.5 mg Oral BID Lelon Perla, MD      . morphine 2 MG/ML injection 2 mg  2 mg Intravenous Q2H PRN Clinton Gallant, MD      . nitroGLYCERIN (NITROSTAT) SL tablet 0.4 mg  0.4  mg Sublingual Q5 min PRN Delora Fuel, MD   0.4 mg at 09/12/14 0727  . ondansetron (ZOFRAN) injection 4 mg  4 mg Intravenous Q6H PRN Clinton Gallant, MD      . pantoprazole (PROTONIX) EC tablet 40 mg  40 mg Oral Daily Lelon Perla, MD        Allergies: Allergies as of 09/12/2014 - Review Complete 09/12/2014  Allergen Reaction Noted  . Coconut oil Anaphylaxis 09/12/2014   Past Medical History  Diagnosis Date  . Diabetes mellitus   . Renal disorder dialysis    [name]   Past Surgical History  Procedure Laterality Date  . Av fistula placement  left arm  . Amputation Left 09/27/2013    Procedure: LEFT GREAT TOE AMPUTATION;  Surgeon: [name], MD;  Location: [place];  Service: Orthopedics;  Laterality: Left;   History reviewed. No pertinent family history. History   Social History  . Marital Status: Legally Separated    Spouse Name: N/A    Number of Children: N/A  . Years of Education: N/A   Occupational History  . Not on file.   Social History  Main Topics  . Smoking status: Current Every Day Smoker  . Smokeless tobacco: Never Used  . Alcohol Use: Yes  . Drug Use: Yes    Special: Marijuana  . Sexual Activity: Yes    Birth Control/ Protection: None   Other Topics Concern  . Not on file   Social History Narrative    Review of Systems: Pertinent items are noted in HPI.  Physical Exam: Blood pressure 172/107, pulse 85, temperature 98.4 F (36.9 C), temperature source Oral, resp. rate 18, height 6\' 2"  (1.88 m), weight 179 lb 9.6 oz (81.466 kg), SpO2 99 %. General: resting in bed, NAD HEENT: PERRL, EOMI, no scleral icterus Cardiac: RRR, no rubs, 2/6 systolic murmur best LSB w/o radiation, no gallops Pulm: clear to auscultation bilaterally, no crackles/wheezes, or rhonchi, moving normal volumes of air Abd: soft, nontender, nondistended, BS present Ext: warm and well perfused, no pedal edema, left UE fistula with palpable thrill and audible bruit Neuro: alert and oriented X3, cranial nerves II-XII grossly intact  Lab results: Basic Metabolic Panel:  Recent Labs  09/12/14 0301  NA 136  K 2.8*  CL 97  CO2 30  GLUCOSE 156*  BUN 10  CREATININE 7.84*  CALCIUM 8.6   CBC:  Recent Labs  09/12/14 0301  WBC 5.4  NEUTROABS 2.4  HGB 8.9*  HCT 27.2*  MCV 81.0  PLT 157   Cardiac Enzymes:  Recent Labs  09/12/14 0301  TROPONINI 0.20*   Imaging results:  Dg Chest Port 1 View  09/12/2014   CLINICAL DATA:  Left chest pain and shortness of breath. Dialysis for 9 years.  EXAM: PORTABLE CHEST - 1 VIEW  COMPARISON:  09/27/2013  FINDINGS: Mild cardiac enlargement without significant vascular congestion. Infiltration or atelectasis in both lung bases. Probable small left pleural effusion. No pneumothorax. Mediastinal contours appear intact.  IMPRESSION: Infiltration or atelectasis in both lung bases with small left pleural effusion.   Electronically Signed   By: Lucienne Capers M.D.   On: 09/12/2014 03:24    Other  results: EKG: normal sinus rhythm, TWI in I, V4 and V6 with some T wave flattening in II, AVF and V5 changed from previous.  Assessment & Plan by Problem: 1. Chest pain: atypical picture but concerning with several risk factors for CAD. Pt last myoview was 10/13  and had stress 04/2014 that were normal. Pt has positive troponin in ED in the setting of ESRD and maybe some ischemic changes on EKG although nonspecific.  -cards consulted greatly appreciate recs -pt was started on heparin gtt in ED  -GI cocktail  -trend trops, repeat EKG in AM -ASA, lipitor -morphine prn pain  2. HTN: pt slightly HTN in ED SBP 170-190s.  -cont amlodipine, metoprolol  3.Anemia: pt baseline Hgb appears to be around 10-11 and today is 8.9. Pt not actively bleeding -anemia panel -FOBT -trend CBC   4. DM: HgbA1c pending. Continue lantus 13 units qhs.   5. ESRD: pt on MWF. Will need HD on 09/13/14  Dispo: Disposition is deferred at this time, awaiting improvement of current medical problems. Anticipated discharge in approximately 2-3 day(s).   The patient does have a current PCP Placido Sou, MD) and does need an [facility] hospital follow-up appointment after discharge.  The patient does not have transportation limitations that hinder transportation to clinic appointments.  Signed: Clinton Gallant, MD 09/12/2014, 8:39 AM

## 2014-09-12 NOTE — Consult Note (Signed)
Primary cardiologist: Dr Johnsie Cancel  HPI: 39 year old male with past medical history of diabetes mellitus, end-stage renal disease for evaluation of chest pain and elevated troponin. Echocardiogram June 2011 showed normal LV function. Nuclear study October 2013 showed an ejection fraction of 52%. There was an apical defect consistent with thinning but no ischemia. Patient states that he has had occasional chest pain since November. The pain is in the left breast area and described as a sharp pain. It occasionally increases with inspiration. No radiation. There is associated dyspnea and nausea. Typically lasts 1-2 hours. Resolves spontaneously. However he developed recurrent pain 3 days ago. It has been continuous without completely resolving. It is not exertional. He also notes increased dyspnea on exertion as well as orthopnea but denies PND or pedal edema. His chest pain persisted and he presented to the emergency room. Troponin 0.2. Cardiology asked to evaluate. Patient also with recent nausea, decreased by mouth intake and diarrhea. Denies fever.   (Not in a hospital admission)  Allergies  Allergen Reactions  . Coconut Oil Anaphylaxis    Can use topically,    Past Medical History  Diagnosis Date  . Diabetes mellitus   . Renal disorder dialysis    [name]    Past Surgical History  Procedure Laterality Date  . Av fistula placement  left arm  . Amputation Left 09/27/2013    Procedure: LEFT GREAT TOE AMPUTATION;  Surgeon: [name], MD;  Location: [place];  Service: Orthopedics;  Laterality: Left;    History   Social History  . Marital Status: Legally Separated    Spouse Name: N/A    Number of Children: N/A  . Years of Education: N/A   Occupational History  . Not on file.   Social History Main Topics  . Smoking status: Current Every Day Smoker  . Smokeless tobacco: Never Used  . Alcohol Use: Yes  . Drug Use: Yes    Special: Marijuana  . Sexual Activity: Yes   Birth Control/ Protection: None   Other Topics Concern  . Not on file   Social History Narrative    History reviewed. No pertinent family history.  ROS:  No fevers or chills but does complain of nausea and diarrhea. He does not make urine. No productive cough, hemoptysis, dysphasia, odynophagia, hematochezia, rash, seizure activity, pedal edema, claudication. Remaining systems are negative.  Physical Exam:   Blood pressure 172/86, pulse 80, temperature 98.1 F (36.7 C), temperature source Oral, resp. rate 17, SpO2 98 %.  General:  Well developed/well nourished in NAD Skin warm/dry Patient not depressed No peripheral clubbing Back-normal HEENT-normal/normal eyelids Neck supple/normal carotid upstroke bilaterally; no bruits; no JVD; no thyromegaly chest - CTA/ normal expansion CV - RRR/normal S1 and S2; no rubs or gallops;  PMI nondisplaced; 2/6 systolic murmur LSB Abdomen -NT/ND, no HSM, no mass, + bowel sounds, no bruit 2+ femoral pulses, no bruits Ext-no edema, chords; diminished distal pulses; AV fistula LUE Neuro-grossly nonfocal  ECG sinus rhythm, left ventricular hypertrophy, nonspecific T-wave changes, prolonged QT interval.  Results for orders placed or performed during the hospital encounter of 09/12/14 (from the past 48 hour(s))  CBC with Differential     Status: Abnormal   Collection Time: 09/12/14  3:01 AM  Result Value Ref Range   WBC 5.4 4.0 - 10.5 K/uL   RBC 3.36 (L) 4.22 - 5.81 MIL/uL   Hemoglobin 8.9 (L) 13.0 - 17.0 g/dL   HCT 27.2 (L) 39.0 - 52.0 %  MCV 81.0 78.0 - 100.0 fL   MCH 26.5 26.0 - 34.0 pg   MCHC 32.7 30.0 - 36.0 g/dL   RDW 14.0 11.5 - 15.5 %   Platelets 157 150 - 400 K/uL   Neutrophils Relative % 45 43 - 77 %   Neutro Abs 2.4 1.7 - 7.7 K/uL   Lymphocytes Relative 44 12 - 46 %   Lymphs Abs 2.4 0.7 - 4.0 K/uL   Monocytes Relative 7 3 - 12 %   Monocytes Absolute 0.4 0.1 - 1.0 K/uL   Eosinophils Relative 4 0 - 5 %   Eosinophils Absolute  0.2 0.0 - 0.7 K/uL   Basophils Relative 0 0 - 1 %   Basophils Absolute 0.0 0.0 - 0.1 K/uL  Basic metabolic panel     Status: Abnormal   Collection Time: 09/12/14  3:01 AM  Result Value Ref Range   Sodium 136 135 - 145 mmol/L   Potassium 2.8 (L) 3.5 - 5.1 mmol/L   Chloride 97 96 - 112 mmol/L   CO2 30 19 - 32 mmol/L   Glucose, Bld 156 (H) 70 - 99 mg/dL   BUN 10 6 - 23 mg/dL   Creatinine, Ser 7.84 (H) 0.50 - 1.35 mg/dL   Calcium 8.6 8.4 - 10.5 mg/dL   GFR calc non Af Amer 8 (L) >90 mL/min   GFR calc Af Amer 9 (L) >90 mL/min    Comment: (NOTE) The eGFR has been calculated using the CKD EPI equation. This calculation has not been validated in all clinical situations. eGFR's persistently <90 mL/min signify possible Chronic Kidney Disease.    Anion gap 9 5 - 15  Troponin I     Status: Abnormal   Collection Time: 09/12/14  3:01 AM  Result Value Ref Range   Troponin I 0.20 (H) <0.031 ng/mL    Comment:        PERSISTENTLY INCREASED TROPONIN VALUES IN THE RANGE OF 0.04-0.49 ng/mL CAN BE SEEN IN:       -UNSTABLE ANGINA       -CONGESTIVE HEART FAILURE       -MYOCARDITIS       -CHEST TRAUMA       -ARRYHTHMIAS       -LATE PRESENTING MYOCARDIAL INFARCTION       -COPD   CLINICAL FOLLOW-UP RECOMMENDED.     Dg Chest Port 1 View  09/12/2014   CLINICAL DATA:  Left chest pain and shortness of breath. Dialysis for 9 years.  EXAM: PORTABLE CHEST - 1 VIEW  COMPARISON:  09/27/2013  FINDINGS: Mild cardiac enlargement without significant vascular congestion. Infiltration or atelectasis in both lung bases. Probable small left pleural effusion. No pneumothorax. Mediastinal contours appear intact.  IMPRESSION: Infiltration or atelectasis in both lung bases with small left pleural effusion.   Electronically Signed   By: Lucienne Capers M.D.   On: 09/12/2014 03:24    Assessment/Plan 1 chest pain-symptoms are atypical. They have been continuous for 3 consecutive days without completely resolving.  Troponin is minimally elevated but in the setting of chronic renal failure. Would continue to cycle enzymes to look for a trend. Will treat with aspirin and statin. Add low-dose metoprolol 12.5 mg by mouth twice a day. His hemoglobin is decreased compared to previous (2/15-11.5; now 8.9). This may be secondary to renal insufficiency but he does describe occasional black stool. Would not add heparin at this point unless follow-up hemoglobin unchanged and his troponin increases further. Ultimately I think he  needs definitive evaluation of his chest pain with cardiac catheterization particularly in light of long-standing diabetes mellitus. We will tentatively plan for tomorrow. However we will need to check his hemoglobin first to make sure that it is not decreasing further. 2 end-stage renal disease-would ask nephrology to see to manage dialysis. 3 diabetes mellitus-management per primary care. 4 hypertension-continue preadmission medications and add low-dose metoprolol as described above. Follow blood pressure and increase medications as needed. 5 acute on chronic anemia-etiology unclear. Most recent hemoglobin is from February 2015. This may be related to renal insufficiency but he also may require GI evaluation. Would add Protonix. Follow hemoglobin. Further evaluation per primary care.  Kirk Ruths MD 09/12/2014, 7:54 AM

## 2014-09-12 NOTE — ED Notes (Signed)
Pt. C/o CP on and off for 3 days. Pt. Had dialysis yesterday and called EMS last night for 7/10 sharp CP in left chest with SOB, nausea, and weakness. Pt. Received 324 ASA and 2 nitro. Pt. SBP was in the 200s and the nitro brought it down.

## 2014-09-12 NOTE — Care Management Note (Signed)
    Page 1 of 1   09/15/2014     4:51:19 PM CARE MANAGEMENT NOTE 09/15/2014  Patient:  [name], [name]   Account Number:  [id number]  Date Initiated:  09/12/2014  Documentation initiated by:  GRAVES-BIGELOW,BRENDA  Subjective/Objective Assessment:   Pt admitted for cp, increased troponin. Hx ESRD on HD.     Action/Plan:   No needs identified by CM at this time. Will continue to monitor.   Anticipated DC Date:  09/14/2014   Anticipated DC Plan:  [place]  CM consult      Choice offered to / List presented to:             Status of service:  Completed, signed off Medicare Important Message given?  NO (If response is "NO", the following Medicare IM given date fields will be blank) Date Medicare IM given:   Medicare IM given by:   Date Additional Medicare IM given:   Additional Medicare IM given by:    Discharge Disposition:  HOME/SELF CARE  Per UR Regulation:  Reviewed for med. necessity/level of care/duration of stay  If discussed at [place] of Stay Meetings, dates discussed:    Comments:  09/15/14 16:00 Cm went to room to notify pt of hie appointment is at 12:00pm tomorrow at the [facility].  RN, Ray to relay message to pt and information is on pt's AVS/discharge instructions.  No other Cm needs were comunicated.  Mariane Masters, BSN, CM [phone].

## 2014-09-12 NOTE — ED Notes (Signed)
Pt is currently chest pain free. Rating pain a 0 out of 10.

## 2014-09-12 NOTE — Progress Notes (Signed)
UR Completed Nechemia Chiappetta Graves-Bigelow, RN,BSN [phone]  

## 2014-09-13 ENCOUNTER — Encounter (HOSPITAL_COMMUNITY): Payer: Self-pay | Admitting: Interventional Cardiology

## 2014-09-13 ENCOUNTER — Other Ambulatory Visit: Payer: Self-pay

## 2014-09-13 ENCOUNTER — Encounter (HOSPITAL_COMMUNITY)
Admission: EM | Disposition: A | Discharge status: Home / Self Care | Payer: Self-pay | Source: Home / Self Care | Attending: Internal Medicine

## 2014-09-13 DIAGNOSIS — D649 Anemia, unspecified: Secondary | ICD-10-CM

## 2014-09-13 DIAGNOSIS — Z992 Dependence on renal dialysis: Secondary | ICD-10-CM

## 2014-09-13 DIAGNOSIS — I2510 Atherosclerotic heart disease of native coronary artery without angina pectoris: Secondary | ICD-10-CM

## 2014-09-13 DIAGNOSIS — N186 End stage renal disease: Secondary | ICD-10-CM

## 2014-09-13 DIAGNOSIS — R079 Chest pain, unspecified: Secondary | ICD-10-CM | POA: Insufficient documentation

## 2014-09-13 DIAGNOSIS — I120 Hypertensive chronic kidney disease with stage 5 chronic kidney disease or end stage renal disease: Secondary | ICD-10-CM

## 2014-09-13 DIAGNOSIS — E1122 Type 2 diabetes mellitus with diabetic chronic kidney disease: Secondary | ICD-10-CM

## 2014-09-13 DIAGNOSIS — Z794 Long term (current) use of insulin: Secondary | ICD-10-CM

## 2014-09-13 DIAGNOSIS — R0789 Other chest pain: Secondary | ICD-10-CM

## 2014-09-13 HISTORY — PX: LEFT HEART CATHETERIZATION WITH CORONARY ANGIOGRAM: SHX5451

## 2014-09-13 LAB — CBC
HCT: 27.4 % — ABNORMAL LOW (ref 39.0–52.0)
HEMOGLOBIN: 9.2 g/dL — AB (ref 13.0–17.0)
MCH: 27.2 pg (ref 26.0–34.0)
MCHC: 33.6 g/dL (ref 30.0–36.0)
MCV: 81.1 fL (ref 78.0–100.0)
PLATELETS: 188 10*3/uL (ref 150–400)
RBC: 3.38 MIL/uL — ABNORMAL LOW (ref 4.22–5.81)
RDW: 14.2 % (ref 11.5–15.5)
WBC: 6.1 10*3/uL (ref 4.0–10.5)

## 2014-09-13 LAB — GLUCOSE, CAPILLARY
Glucose-Capillary: 88 mg/dL (ref 70–99)
Glucose-Capillary: 165 mg/dL — ABNORMAL HIGH (ref 70–99)

## 2014-09-13 LAB — RENAL FUNCTION PANEL
ANION GAP: 11 (ref 5–15)
Albumin: 3.1 g/dL — ABNORMAL LOW (ref 3.5–5.2)
Albumin: 3.6 g/dL (ref 3.5–5.2)
Anion gap: 9 (ref 5–15)
BUN: 27 mg/dL — ABNORMAL HIGH (ref 6–23)
BUN: 32 mg/dL — ABNORMAL HIGH (ref 6–23)
CALCIUM: 8.8 mg/dL (ref 8.4–10.5)
CO2: 33 mmol/L — ABNORMAL HIGH (ref 19–32)
CO2: 30 mmol/L (ref 19–32)
Calcium: 8.8 mg/dL (ref 8.4–10.5)
Chloride: 97 mmol/L (ref 96–112)
Chloride: 96 mmol/L (ref 96–112)
Creatinine, Ser: 10.7 mg/dL — ABNORMAL HIGH (ref 0.50–1.35)
Creatinine, Ser: 11.72 mg/dL — ABNORMAL HIGH (ref 0.50–1.35)
GFR calc Af Amer: 6 mL/min — ABNORMAL LOW (ref >90)
GFR calc non Af Amer: 5 mL/min — ABNORMAL LOW (ref >90)
GFR calc non Af Amer: 5 mL/min — ABNORMAL LOW (ref >90)
GFR, EST AFRICAN AMERICAN: 6 mL/min — AB (ref >90)
GLUCOSE: 107 mg/dL — AB (ref 70–99)
Glucose, Bld: 83 mg/dL (ref 70–99)
PHOSPHORUS: 5.3 mg/dL — AB (ref 2.3–4.6)
POTASSIUM: 3.6 mmol/L (ref 3.5–5.1)
Phosphorus: 5.2 mg/dL — ABNORMAL HIGH (ref 2.3–4.6)
Potassium: 3.5 mmol/L (ref 3.5–5.1)
Sodium: 139 mmol/L (ref 135–145)
Sodium: 137 mmol/L (ref 135–145)

## 2014-09-13 LAB — HEMOGLOBIN A1C
HEMOGLOBIN A1C: 5.9 % — AB (ref 4.8–5.6)
MEAN PLASMA GLUCOSE: 123 mg/dL

## 2014-09-13 LAB — PROTIME-INR
INR: 1.05 (ref 0.00–1.49)
Prothrombin Time: 13.8 s (ref 11.6–15.2)

## 2014-09-13 LAB — TROPONIN I
TROPONIN I: 0.11 ng/mL — AB (ref <0.031)
Troponin I: 0.13 ng/mL — ABNORMAL HIGH (ref <0.031)
Troponin I: 0.13 ng/mL — ABNORMAL HIGH (ref <0.031)

## 2014-09-13 SURGERY — LEFT HEART CATHETERIZATION WITH CORONARY ANGIOGRAM

## 2014-09-13 MED ORDER — SODIUM CHLORIDE 0.9 % IV SOLN: 100.0000 mL | INTRAVENOUS | Status: DC | PRN

## 2014-09-13 MED ORDER — METOPROLOL TARTRATE 1 MG/ML IV SOLN
5.0000 mg | Freq: Once | INTRAVENOUS | Status: AC
  Administered 2014-09-13: 5 mg via INTRAVENOUS
  Filled 2014-09-13: qty 5

## 2014-09-13 MED ORDER — SODIUM CHLORIDE 0.9 % IJ SOLN
3.0000 mL | Freq: Two times a day (BID) | INTRAMUSCULAR | Status: DC
  Administered 2014-09-13 – 2014-09-14 (×3): 3 mL via INTRAVENOUS

## 2014-09-13 MED ORDER — HEPARIN BOLUS VIA INFUSION
3000.0000 [IU] | Freq: Once | INTRAVENOUS | Status: AC
  Administered 2014-09-13: 3000 [IU] via INTRAVENOUS
  Filled 2014-09-13: qty 3000

## 2014-09-13 MED ORDER — SODIUM CHLORIDE 0.9 % IJ SOLN: 3.0000 mL | INTRAMUSCULAR | Status: DC | PRN

## 2014-09-13 MED ORDER — OXYCODONE-ACETAMINOPHEN 5-325 MG PO TABS: 1.0000 | ORAL_TABLET | ORAL | Status: DC | PRN

## 2014-09-13 MED ORDER — LIDOCAINE-PRILOCAINE 2.5-2.5 % EX CREA
1.0000 "application " | TOPICAL_CREAM | CUTANEOUS | Status: DC | PRN
  Filled 2014-09-13: qty 5

## 2014-09-13 MED ORDER — METOPROLOL TARTRATE 1 MG/ML IV SOLN
5.0000 mg | Freq: Once | INTRAVENOUS | Status: AC
  Administered 2014-09-13: 5 mg via INTRAVENOUS

## 2014-09-13 MED ORDER — HEPARIN (PORCINE) IN NACL 2-0.9 UNIT/ML-% IJ SOLN
INTRAMUSCULAR | Status: AC
  Filled 2014-09-13: qty 1500

## 2014-09-13 MED ORDER — HEPARIN SODIUM (PORCINE) 5000 UNIT/ML IJ SOLN
5000.0000 [IU] | Freq: Three times a day (TID) | INTRAMUSCULAR | Status: DC
  Administered 2014-09-13 – 2014-09-14 (×4): 5000 [IU] via SUBCUTANEOUS
  Filled 2014-09-13 (×5): qty 1

## 2014-09-13 MED ORDER — MIDAZOLAM HCL 2 MG/2ML IJ SOLN
INTRAMUSCULAR | Status: AC
  Filled 2014-09-13: qty 2

## 2014-09-13 MED ORDER — DARBEPOETIN ALFA 25 MCG/0.42ML IJ SOSY
PREFILLED_SYRINGE | INTRAMUSCULAR | Status: AC
  Filled 2014-09-13: qty 0.42

## 2014-09-13 MED ORDER — DARBEPOETIN ALFA 25 MCG/0.42ML IJ SOSY
25.0000 ug | PREFILLED_SYRINGE | INTRAMUSCULAR | Status: DC
  Administered 2014-09-13: 25 ug via INTRAVENOUS
  Filled 2014-09-13: qty 0.42

## 2014-09-13 MED ORDER — LISINOPRIL 10 MG PO TABS
10.0000 mg | ORAL_TABLET | Freq: Every day | ORAL | Status: DC
  Administered 2014-09-14: 10 mg via ORAL
  Filled 2014-09-13 (×2): qty 1

## 2014-09-13 MED ORDER — HEPARIN SODIUM (PORCINE) 1000 UNIT/ML DIALYSIS
20.0000 [IU]/kg | INTRAMUSCULAR | Status: DC | PRN
  Filled 2014-09-13: qty 2

## 2014-09-13 MED ORDER — ONDANSETRON HCL 4 MG/2ML IJ SOLN: 4.0000 mg | Freq: Four times a day (QID) | INTRAMUSCULAR | Status: DC | PRN

## 2014-09-13 MED ORDER — ALTEPLASE 2 MG IJ SOLR
2.0000 mg | Freq: Once | INTRAMUSCULAR | Status: AC | PRN
  Filled 2014-09-13: qty 2

## 2014-09-13 MED ORDER — PENTAFLUOROPROP-TETRAFLUOROETH EX AERO: 1.0000 "application " | INHALATION_SPRAY | CUTANEOUS | Status: DC | PRN

## 2014-09-13 MED ORDER — LIDOCAINE HCL (PF) 1 % IJ SOLN: 5.0000 mL | INTRAMUSCULAR | Status: DC | PRN

## 2014-09-13 MED ORDER — SODIUM CHLORIDE 0.9 % IV SOLN: 250.0000 mL | INTRAVENOUS | Status: DC | PRN

## 2014-09-13 MED ORDER — HEPARIN SODIUM (PORCINE) 1000 UNIT/ML DIALYSIS
1000.0000 [IU] | INTRAMUSCULAR | Status: DC | PRN
  Filled 2014-09-13: qty 1

## 2014-09-13 MED ORDER — NEPRO/CARBSTEADY PO LIQD
237.0000 mL | ORAL | Status: DC | PRN
  Filled 2014-09-13: qty 237

## 2014-09-13 MED ORDER — METOPROLOL TARTRATE 25 MG PO TABS
25.0000 mg | ORAL_TABLET | Freq: Two times a day (BID) | ORAL | Status: DC
  Administered 2014-09-13 – 2014-09-14 (×3): 25 mg via ORAL
  Filled 2014-09-13 (×3): qty 1

## 2014-09-13 MED ORDER — RENA-VITE PO TABS
1.0000 | ORAL_TABLET | Freq: Every day | ORAL | Status: DC
  Administered 2014-09-13 – 2014-09-14 (×2): 1 via ORAL
  Filled 2014-09-13 (×2): qty 1

## 2014-09-13 MED ORDER — LABETALOL HCL 5 MG/ML IV SOLN
INTRAVENOUS | Status: AC
  Filled 2014-09-13: qty 4

## 2014-09-13 MED ORDER — HEPARIN (PORCINE) IN NACL 100-0.45 UNIT/ML-% IJ SOLN
1000.0000 [IU]/h | INTRAMUSCULAR | Status: DC
  Administered 2014-09-13: 1000 [IU]/h via INTRAVENOUS
  Filled 2014-09-13: qty 250

## 2014-09-13 MED ORDER — METOPROLOL TARTRATE 1 MG/ML IV SOLN
INTRAVENOUS | Status: AC
  Filled 2014-09-13: qty 5

## 2014-09-13 MED ORDER — FENTANYL CITRATE 0.05 MG/ML IJ SOLN
INTRAMUSCULAR | Status: AC
  Filled 2014-09-13: qty 2

## 2014-09-13 MED ORDER — LIDOCAINE HCL (PF) 1 % IJ SOLN
INTRAMUSCULAR | Status: AC
  Filled 2014-09-13: qty 30

## 2014-09-13 MED ORDER — CALCITRIOL 0.5 MCG PO CAPS
1.2500 ug | ORAL_CAPSULE | ORAL | Status: DC
  Administered 2014-09-13: 1.25 ug via ORAL
  Filled 2014-09-13: qty 1

## 2014-09-13 NOTE — Progress Notes (Addendum)
At approximately 2015 on 09/12/14, pt began complaining of chest pain and shortness of breath. Pt's vital signs were obtained and oxygen was increased to 2.5L. Pt reported left sided CP 7/10 and sharp. Nitro given x2. CP 5/10; shortness of breath relieved. 5mg  Norvasc was scheduled for 2200 but was given early due to elevated BP. Pt refuses morphine at this time.   At 0500, pt c/o CP 6/10, shortness of breath, nausea. Morphine 2 mg given. BP 198/107. MD notified. 5 mg IV metoprolol ordered. Will continue to monitor pt.

## 2014-09-13 NOTE — CV Procedure (Signed)
     Left Heart Catheterization with Coronary Angiography  Report  [name] Albers Sr.  39 y.o.  male 1976-05-17  Procedure Date: 09/13/2014 Referring Physician: Quay Burow, M.D. Primary Cardiologist: Quay Burow, M.D.  INDICATIONS: Elevated troponin in the setting of diabetes type 1 and continuous chest pain. Patient is end-stage renal disease and on chronic hemodialysis.  PROCEDURE: 1. Left heart catheterization; 2. Coronary angiography; 3. Left ventriculography  CONSENT:  The risks, benefits, and details of the procedure were explained in detail to the patient. Risks including death, stroke, heart attack, kidney injury, allergy, limb ischemia, bleeding and radiation injury were discussed.  The patient verbalized understanding and wanted to proceed.  Informed written consent was obtained.  PROCEDURE TECHNIQUE:  After Xylocaine anesthesia a 5 French sheath was placed in the right femoral artery using the modified Seldinger technique.  Coronary angiography was done using a 5 F A2 multipurpose diagnostic catheters.  Left ventriculography was done using the multipurpose catheter and hand injection. Poor visualization, however there is demonstration of mild LV enlargement and global hypokinesis.  After reviewing the digital images, the case was terminated. Hemostasis was achieved with a Vascade vascular closure device.   CONTRAST:  Total of 100 cc.  COMPLICATIONS:  None   HEMODYNAMICS:  Aortic pressure 156/89 mmHg; LV pressure 156/22 mmHg; LVEDP 36 mmHg  ANGIOGRAPHIC DATA:   The left main coronary artery is widely patent..  The left anterior descending artery is moderate to severely calcified. The mid-distal vessel contains 50-60% eccentric narrowing within a region of angulation. The LAD then wraps around the left ventricular apex. Multiple diagonal branches arise from the LAD. No high-grade or significant stenoses are noted.  The left circumflex artery is patent.  Calcification is noted. The first marginal is widely patent. The second marginal trifurcates on the lateral wall. No significant obstruction is noted. Beyond the second marginal the continuation of the circumflex contains a 90% stenosis. The vessel beyond this stenosis is very small in distribution.  The right coronary artery is calcified. It is dominant in distribution. It is widely patent without significant stenosis.Marland Kitchen  LEFT VENTRICULOGRAM:  Left ventricular angiogram was done in the 30 RAO projection and revealed dilated. There is global hypokinesis with an estimated ejection fraction of 35-40%   IMPRESSIONS:  1. 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 Etiology is uncertain but likely hypertension related. 2. Heavily calcified but widely patent coronary arteries. The mid to distal LAD contains an eccentric 50-60%, and the continuation of the circumflex beyond the second marginal contains 90% stenosis. The circumflex beyond the second marginal is very small in distribution and is not clinically relevant. 3. Prolonged chest pain of uncertain etiology 4. Elevated troponin related to end-stage renal disease and cardiomyopathy.  RECOMMENDATION:  Heart failure therapy per treating team.

## 2014-09-13 NOTE — Clinical Documentation Improvement (Signed)
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   NSTEMI  Chronic Systolic & Diastolic Congestive Heart Failure  Acute Systolic & Diastolic Congestive Heart Failure  Acute on Chronic Systolic & Diastolic Congestive Heart Failure  Other Condition________________________________________  Cannot Clinically Determine  Supporting Information:  Risk Factors: Chest Pain, Combined systolic and diastolic heart failure, ESRD  Signs & Symptoms: Elevated troponin related to end-stage renal disease and cardiomyopathy, ESRD  Diagnostics: Component      Troponin I  Latest Ref Rng      <0.031 ng/mL  09/12/2014     3:01 AM 0.20 (H)  09/12/2014     11:00 AM 0.15 (H)   Component      Troponin I  Latest Ref Rng      <0.031 ng/mL  09/12/2014     4:25 PM 0.15 (H)   Component      Troponin I  Latest Ref Rng      <0.031 ng/mL  09/12/2014     11:35 PM 0.13 (H)  09/13/2014      0.13 (H)   Treatment:   amLODipine (NORVASC) tablet 5 mg     metoprolol (LOPRESSOR) 1 MG/ML injection    Thank You, Heloise Beecham ,RN Clinical Documentation Specialist:  [employer]

## 2014-09-13 NOTE — Progress Notes (Signed)
Patient Name: [name] Sprigg Sr. Date of Encounter: 09/13/2014     Principal Problem:   Chest pain Active Problems:   ESRD on dialysis   Diabetes    SUBJECTIVE  Continue to have chest discomfort, never went away, right now 3/10  CURRENT MEDS . amLODipine  5 mg Oral QHS  . aspirin  81 mg Oral Daily  . atorvastatin  80 mg Oral q1800  . calcium acetate  2,001 mg Oral TID WC  . Chlorhexidine Gluconate Cloth  6 each Topical Q0600  . darbepoetin (ARANESP) injection - DIALYSIS  25 mcg Intravenous Q Fri-HD  . insulin aspart  0-9 Units Subcutaneous TID WC  . insulin glargine  13 Units Subcutaneous QHS  . lanthanum  1,000 mg Oral TID WC  . metoprolol      . metoprolol tartrate  12.5 mg Oral BID  . mupirocin ointment   Nasal BID  . pantoprazole  40 mg Oral Daily    OBJECTIVE  Filed Vitals:   09/12/14 2044 09/13/14 0500 09/13/14 0536 09/13/14 0629  BP: 170/94 198/107 180/103 192/101  Pulse: 90     Temp: 98.7 F (37.1 C) 98.2 F (36.8 C)    TempSrc: Oral Oral    Resp: 18 18    Height:      Weight:      SpO2: 93% 98%      Intake/Output Summary (Last 24 hours) at 09/13/14 1020 Last data filed at 09/12/14 2000  Gross per 24 hour  Intake    720 ml  Output      0 ml  Net    720 ml   Filed Weights   09/12/14 0822  Weight: 179 lb 9.6 oz (81.466 kg)    PHYSICAL EXAM  General: Pleasant, NAD. Neuro: Alert and oriented X 3. Moves all extremities spontaneously. Psych: Normal affect. HEENT:  Normal  Neck: Supple without bruits or JVD. Lungs:  Resp regular and unlabored, CTA. Heart: RRR no s3, s4, or murmurs. LUE AVF Abdomen: Soft, non-tender, non-distended, BS + x 4.  Extremities: No clubbing, cyanosis or edema. DP/PT/Radials 2+ and equal bilaterally.  Accessory Clinical Findings  CBC  Recent Labs  09/12/14 0301 09/13/14 0317  WBC 5.4 6.1  NEUTROABS 2.4  --   HGB 8.9* 9.2*  HCT 27.2* 27.4*  MCV 81.0 81.1  PLT 157 0000000   Basic Metabolic  Panel  Recent Labs  09/12/14 1625 09/13/14 0317  NA 139 139  K 3.7 3.5  CL 96 97  CO2 32 33*  GLUCOSE 139* 107*  BUN 16 27*  CREATININE 9.35* 10.70*  CALCIUM 9.4 8.8  PHOS  --  5.2*   Liver Function Tests  Recent Labs  09/13/14 0317  ALBUMIN 3.1*   Cardiac Enzymes  Recent Labs  09/12/14 1625 09/12/14 2335 09/13/14 0724  TROPONINI 0.15* 0.13* 0.13*   Hemoglobin A1C  Recent Labs  09/12/14 1110  HGBA1C 5.9*    TELE NSR with HR 60-70s    ECG  NSR with TWI in lateral leads  Echocardiogram 02/01/2010  Left ventricle: The cavity size was normal. There was moderate concentric hypertrophy. Systolic function was normal. The estimated ejection fraction was in the range of 60% to 65%. Wall motion was normal; there were no regional wall motion abnormalities.    Radiology/Studies  Dg Chest Port 1 View  09/12/2014   CLINICAL DATA:  Left chest pain and shortness of breath. Dialysis for 9 years.  EXAM: PORTABLE CHEST - 1  VIEW  COMPARISON:  09/27/2013  FINDINGS: Mild cardiac enlargement without significant vascular congestion. Infiltration or atelectasis in both lung bases. Probable small left pleural effusion. No pneumothorax. Mediastinal contours appear intact.  IMPRESSION: Infiltration or atelectasis in both lung bases with small left pleural effusion.   Electronically Signed   By: Lucienne Capers M.D.   On: 09/12/2014 03:24    ASSESSMENT AND PLAN  1. Atypical chest pain  - continuous CP for 3 days, trop mildly elevated, however otherwise flat  - pending cardiac cath today to definitively r/o CAD in light of his long standing DM  2. ESRD 3. DM 4. HTN: low dose metoprolol added 5. Acute on chronic anemia: hgb stable. May be related to renal insufficiency   Signed, Woodward Ku Pager: [phone] Agree with note by Almyra Deforest PA-C  Trop low level + probably secondary to CRI. EKG shows LVH with repol changes. CP constant. ? Pericarditis. I did not hear a  rub. Plan cor angio to R/O CAD.   Lorretta Harp, M.D., [place], [facility], [facility] [address] [place], [place]  [address]  [phone] 09/13/2014 10:58 AM

## 2014-09-13 NOTE — Progress Notes (Signed)
ANTICOAGULATION CONSULT NOTE - Initial Consult  Pharmacy Consult for heparin Indication: chest pain/ACS  Allergies  Allergen Reactions  . Coconut Oil Anaphylaxis    Can use topically,    Patient Measurements: Height: 6\' 2"  (188 cm) Weight: 179 lb 9.6 oz (81.466 kg) IBW/kg (Calculated) : 82.2  Vital Signs: Temp: 98.7 F (37.1 C) (01/28 2044) Temp Source: Oral (01/28 2044) BP: 198/107 mmHg (01/29 0500) Pulse Rate: 90 (01/28 2044)  Labs:  Recent Labs  09/12/14 0301 09/12/14 1100 09/12/14 1625 09/12/14 2335 09/13/14 0317 09/13/14 0400  HGB 8.9*  --   --   --  9.2*  --   HCT 27.2*  --   --   --  27.4*  --   PLT 157  --   --   --  188  --   LABPROT  --   --   --   --   --  13.8  INR  --   --   --   --   --  1.05  CREATININE 7.84*  --  9.35*  --  10.70*  --   TROPONINI 0.20* 0.15* 0.15* 0.13*  --   --     Estimated Creatinine Clearance: 10.9 mL/min (by C-G formula based on Cr of 10.7).   Medical History: Past Medical History  Diagnosis Date  . Diabetes mellitus   . Renal disorder dialysis    MWF [facility]  . Hypertension     Medications:  Prescriptions prior to admission  Medication Sig Dispense Refill Last Dose  . amLODipine (NORVASC) 5 MG tablet Take 1 tablet (5 mg total) by mouth at bedtime. 30 tablet 0 Past Week at Unknown time  . calcium acetate (PHOSLO) 667 MG capsule Take 3 capsules (2,001 mg total) by mouth 3 (three) times daily with meals. 30 capsule 0 09/11/2014 at Unknown time  . insulin glargine (LANTUS) 100 UNIT/ML injection Inject 0.13 mLs (13 Units total) into the skin at bedtime. 10 mL 12 09/11/2014 at Unknown time  . lanthanum (FOSRENOL) 1000 MG chewable tablet Chew 1 tablet (1,000 mg total) by mouth 3 (three) times daily with meals. 30 tablet 0 09/11/2014 at Unknown time  . HYDROcodone-acetaminophen (NORCO/VICODIN) 5-325 MG per tablet Take 1 tablets by mouth every 6 hours as needed for pain. (Patient not taking: Reported on 09/12/2014) 13 tablet  0 Not Taking at Unknown time  . methocarbamol (ROBAXIN) 500 MG tablet Take 1 tablet (500 mg total) by mouth 2 (two) times daily as needed for muscle spasms. (Patient not taking: Reported on 09/12/2014) 13 tablet 0 Not Taking at Unknown time  . oxyCODONE-acetaminophen (ROXICET) 5-325 MG per tablet Take 1 tablet by mouth every 4 (four) hours as needed for severe pain. (Patient not taking: Reported on 09/12/2014) 60 tablet 0 Not Taking at Unknown time   Scheduled:  . amLODipine  5 mg Oral QHS  . aspirin  81 mg Oral Daily  . atorvastatin  80 mg Oral q1800  . calcium acetate  2,001 mg Oral TID WC  . Chlorhexidine Gluconate Cloth  6 each Topical Q0600  . insulin aspart  0-9 Units Subcutaneous TID WC  . insulin glargine  13 Units Subcutaneous QHS  . lanthanum  1,000 mg Oral TID WC  . metoprolol  5 mg Intravenous Once  . metoprolol tartrate  12.5 mg Oral BID  . mupirocin ointment   Nasal BID  . pantoprazole  40 mg Oral Daily    Assessment: 39yo male admitted yesterday  w/ CP and started on heparin that was subsequently d/c'd; pt now c/o CP and SOB, to resume heparin.  Goal of Therapy:  Heparin level 0.3-0.7 units/ml Monitor platelets by anticoagulation protocol: Yes   Plan:  Will give heparin 3000 units IV bolus x1 followed by gtt at 1000 units/hr and monitor heparin levels and CBC.  Wynona Neat, PharmD, BCPS  09/13/2014,5:22 AM

## 2014-09-13 NOTE — Consult Note (Signed)
Indication for Consultation: Management of ESRD/hemodialysis; anemia, hypertension/volume and secondary hyperparathyroidism  HPI:   [name]  is a 39 y.o. Male with ESRD MWF, DM1, HTN, who presented with left sharp non-radiating intermittent chest pain for three days. Associated symptoms nausea, SOB and diaphoresis. No relief with OTC antacids. Pt reports he ran out of his BP medication about a week ago. He reports his systolic BP was in the [id number] at his last HD session. Reports being able to obtain BP medications Monday (2/1).   In the ED patient had elevated BP (199/105), elevated trop (0.20), CXR showed bilateral infiltration or atelectasis and small Left pleural effusion. No acute ST/T changes noted on EKG. The pt was given a SL NTG which relieved pain. Cardiology was consulted and a heparin drip was started. Patient was admitted and had a cardiac cath scheduled for the AM. Nephrology consulted to manage HD.    Patient had a myoview (10/13) and stress (9/15) both normal.    Past Medical History  Diagnosis Date  . Diabetes mellitus   . Renal disorder dialysis    MWF [facility]  . Hypertension     Past Surgical History  Procedure Laterality Date  . Av fistula placement  left arm  . Amputation Left 09/27/2013    Procedure: LEFT GREAT TOE AMPUTATION;  Surgeon: [name], MD;  Location: [facility];  Service: Orthopedics;  Laterality: Left;    History reviewed. No pertinent family history.  History   Social History  . Marital Status: Legally Separated    Spouse Name: N/A    Number of Children: N/A  . Years of Education: N/A   Occupational History  . Not on file.   Social History Main Topics  . Smoking status: Current Every Day Smoker -- 0.25 packs/day for 20 years    Types: Cigarettes  . Smokeless tobacco: Never Used  . Alcohol Use: Yes  . Drug Use: Yes    Special: Marijuana  . Sexual Activity: Yes    Birth Control/ Protection: None   Other Topics Concern  . Not on  file   Social History Narrative    Allergies  Allergen Reactions  . Coconut Oil Anaphylaxis    Can use topically,    Scheduled Meds: . [MAR Hold] amLODipine  5 mg Oral QHS  . [MAR Hold] aspirin  81 mg Oral Daily  . [MAR Hold] atorvastatin  80 mg Oral q1800  . [MAR Hold] calcitRIOL  1.25 mcg Oral Q M,W,F-HD  . [MAR Hold] calcium acetate  2,001 mg Oral TID WC  . [MAR Hold] Chlorhexidine Gluconate Cloth  6 each Topical Q0600  . [MAR Hold] darbepoetin (ARANESP) injection - DIALYSIS  25 mcg Intravenous Q Fri-HD  . [MAR Hold] insulin aspart  0-9 Units Subcutaneous TID WC  . [MAR Hold] insulin glargine  13 Units Subcutaneous QHS  . [MAR Hold] lanthanum  1,000 mg Oral TID WC  . metoprolol      . [MAR Hold] metoprolol tartrate  12.5 mg Oral BID  . [MAR Hold] mupirocin ointment   Nasal BID  . [MAR Hold] pantoprazole  40 mg Oral Daily   Continuous Infusions: . heparin 1,000 Units/hr (09/13/14 0542)   PRN Meds:.[MAR Hold] sodium chloride, [MAR Hold] sodium chloride, [MAR Hold] acetaminophen, [MAR Hold] alteplase, [MAR Hold] feeding supplement (NEPRO CARB STEADY), [MAR Hold] gi cocktail, [MAR Hold] heparin, [MAR Hold] heparin, [MAR Hold] lidocaine (PF), [MAR Hold] lidocaine-prilocaine, [MAR Hold]  morphine injection, [MAR Hold] ondansetron (ZOFRAN)  IV, [MAR Hold] pentafluoroprop-tetrafluoroeth  Labs: CBC    Component Value Date/Time   WBC 6.1 09/13/2014 0317   RBC 3.38* 09/13/2014 0317   RBC 3.66* 09/12/2014 1100   HGB 9.2* 09/13/2014 0317   HCT 27.4* 09/13/2014 0317   PLT 188 09/13/2014 0317   MCV 81.1 09/13/2014 0317   MCH 27.2 09/13/2014 0317   MCHC 33.6 09/13/2014 0317   RDW 14.2 09/13/2014 0317   LYMPHSABS 2.4 09/12/2014 0301   MONOABS 0.4 09/12/2014 0301   EOSABS 0.2 09/12/2014 0301   BASOSABS 0.0 09/12/2014 0301    CMP Latest Ref Rng 09/13/2014 09/12/2014 09/12/2014  Glucose 70 - 99 mg/dL 107(H) 139(H) 156(H)  BUN 6 - 23 mg/dL 27(H) 16 10  Creatinine 0.50 - 1.35 mg/dL  10.70(H) 9.35(H) 7.84(H)  Sodium 135 - 145 mmol/L 139 139 136  Potassium 3.5 - 5.1 mmol/L 3.5 3.7 2.8(L)  Chloride 96 - 112 mmol/L 97 96 97  CO2 19 - 32 mmol/L 33(H) 32 30  Calcium 8.4 - 10.5 mg/dL 8.8 9.4 8.6  Total Protein 6.0 - 8.3 g/dL - - -  Total Bilirubin 0.3 - 1.2 mg/dL - - -  Alkaline Phos 39 - 117 U/L - - -  AST 0 - 37 U/L - - -  ALT 0 - 53 U/L - - -   Cardiac Panel (last 3 results)  Recent Labs  09/12/14 1625 09/12/14 2335 09/13/14 0724  TROPONINI 0.15* 0.13* 0.13*    Dg Chest Port 1 View  09/12/2014   CLINICAL DATA:  Left chest pain and shortness of breath. Dialysis for 9 years.  EXAM: PORTABLE CHEST - 1 VIEW  COMPARISON:  09/27/2013  FINDINGS: Mild cardiac enlargement without significant vascular congestion. Infiltration or atelectasis in both lung bases. Probable small left pleural effusion. No pneumothorax. Mediastinal contours appear intact.  IMPRESSION: Infiltration or atelectasis in both lung bases with small left pleural effusion.   Electronically Signed   By: Lucienne Capers M.D.   On: 09/12/2014 03:24    ROS:  Denies vision changes, abdominal pain, vomiting, edema Admits to headache, nausea, SOB, chest pain (re-occuring this morning)  Physical Exam: Blood pressure 174/86, pulse 90, temperature 98.2 F (36.8 C), temperature source Oral, resp. rate 18, height 6\' 2"  (1.88 m), weight 81.466 kg (179 lb 9.6 oz), SpO2 98 %.  General: Lying in bed, NAD, sitting up in breath. Head: Sclera non-icteric, Neck: Supple. JVD not elevated.  Lungs: labored breathing, course crackles and rales with expiratory wheezes.  Heart: RRR with S1 S2. No murmur, gallop or rub.  Abdomen: Soft, non-tender, non-distended. No rebound/guarding. No obvious abdominal masses. +BS  Lower extremities: No LE edema.  Dialysis Access: Left AVF +b/t.  Dialysis Orders:  [facility] MWF  4 hr 500/800 EDW 82 2K/2Ca Heparin 8000 L AVF Calcitriol 1.25 mcg MWF  Recent Labs: Hgb 11.6 (1/20), P 5.0  (1/13), TSAT 31 and PTH 294 (1/16)     Assessment/Plan:    Chest Pain: EKG No acute ST/T wave changes, Trop 0.20 -> 0.13. Cardio consulted. Heparin drip started, Pt had     cardiac cath this AM. Cath showed combined systolic and diastolic heart failure with estimated EF of 35-40%,           elevated left ventricular end diastolic pressure of 36 mmHg and calcified but patent coronary arteries.  Accelerated HTN: BP 199/105 in ED, currently 174/86. Amlodipine and metoprolol.   ESRD: MWF.  HD today after cath. K+ 3.5.   Anemia:  Hgb 9.2. Restarted 25 mcg Aranesp q Fri which was d/c in Nov.   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 ESRD patient with CP and ^'d BP.  Heart cath today w/o sig CAD but filling pressures were high in the mid 30's.  CXR shows early edema, pt not symptomatic at time of exam but needs volume reduced with HD . Plan HD today with max UF, may need to run again tomorrow. Will follow.  Kelly Splinter MD pager [phone]    cell [phone] 09/13/2014, 4:24 PM

## 2014-09-13 NOTE — Progress Notes (Addendum)
S: Night MD notified that patient has recurrence of presenting chest pain. He says it is the same as his previous pain and that it improved with morphine given by RN. He had not had this pain since earlier this evening. RN reported that heparin drip was stopped early this morning without internal medicine team notification as Dr Stanford Breed note from initial evaluation said to wait for follow-up hemoglobin.   O: 37.1C HR 90 BP 198/107 RR 18 98%  Gen: Resting comfortably in bed, no acute distress Cards: RRR, nl S1 S2, no [name]/g Lungs: CTAB, respirations unlabored  A/P: [name] is tentatively undergoing cardiac catheterization today. His hemoglobin stable since admission so will resume heparin drip. While his troponins had trended down and may have been elevated due to ESRD, we will repeat EKG and continue trending troponins due to recurrence of pain. I spoke to Dr Vanita Panda earlier in shift who is aware patient needs HD following cath.  Lottie Mussel, MD 09/13/14 5:39 am  ADDENDUM: Metoprolol 5 mg iv once was ordered for HTN  Lottie Mussel, MD 09/13/14 5:40 am  ADDENDUM: [name] with on call cardiologist Dr. Philbert Riser who will let day team know he had recurrence of pain but notes this is non-emergent. Please call back if new hemodynamic instability, intractable pain, or electrical activity on telemetry.  Lottie Mussel, MD 1/29.16 5:59 am

## 2014-09-13 NOTE — Interval H&P Note (Signed)
Cath Lab Visit (complete for each Cath Lab visit)  Clinical Evaluation Leading to the Procedure:   ACS: Yes.    Non-ACS:    Anginal Classification: CCS IV  Anti-ischemic medical therapy: Minimal Therapy (1 class of medications)  Non-Invasive Test Results: No non-invasive testing performed  Prior CABG: No previous CABG      History and Physical Interval Note:  09/13/2014 10:58 AM  [name] Labella Sr.  has presented today for surgery, with the diagnosis of cp  The various methods of treatment have been discussed with the patient and family. After consideration of risks, benefits and other options for treatment, the patient has consented to  Procedure(s): LEFT HEART CATHETERIZATION WITH CORONARY ANGIOGRAM (N/A) as a surgical intervention .  The patient's history has been reviewed, patient examined, no change in status, stable for surgery.  I have reviewed the patient's chart and labs.  Questions were answered to the patient's satisfaction.     Sinclair Grooms

## 2014-09-13 NOTE — Progress Notes (Signed)
Subjective: [name] feels "better". Had an episode of chest pain early this morning, but it has fully subsided. Not currently dizzy or light-headed.  Objective: Vital signs in last 24 hours: Filed Vitals:   09/12/14 2044 09/13/14 0500 09/13/14 0536 09/13/14 0629  BP: 170/94 198/107 180/103 192/101  Pulse: 90     Temp: 98.7 F (37.1 C) 98.2 F (36.8 C)    TempSrc: Oral Oral    Resp: 18 18    Height:      Weight:      SpO2: 93% 98%     Weight change:   Intake/Output Summary (Last 24 hours) at 09/13/14 0651 Last data filed at 09/12/14 2000  Gross per 24 hour  Intake    720 ml  Output      0 ml  Net    720 ml   Physical Exam: General: resting in bed, NAD, about to leave for catheterization HEENT: PERRL, EOMI, no scleral icterus Cardiac: RRR, no rubs, 2/6 systolic murmur best LSB w/o radiation, no gallops Pulm: CTAB, no crackles/wheezes, or rhonchi, moving normal volumes of air Abd: BS present, soft, nontender, nondistended Ext: warm and well perfused, no pedal edema, left UE fistula with palpable thrill and audible bruit Neuro: A&O x 3, cranial nerves II-XII grossly intact  Lab Results: Basic Metabolic Panel:  Recent Labs Lab 09/12/14 1625 09/13/14 0317  NA 139 139  K 3.7 3.5  CL 96 97  CO2 32 33*  GLUCOSE 139* 107*  BUN 16 27*  CREATININE 9.35* 10.70*  CALCIUM 9.4 8.8  PHOS  --  5.2*   Liver Function Tests:  Recent Labs Lab 09/13/14 0317  ALBUMIN 3.1*   CBC:  Recent Labs Lab 09/12/14 0301 09/13/14 0317  WBC 5.4 6.1  NEUTROABS 2.4  --   HGB 8.9* 9.2*  HCT 27.2* 27.4*  MCV 81.0 81.1  PLT 157 188   Cardiac Enzymes:  Recent Labs Lab 09/12/14 1100 09/12/14 1625 09/12/14 2335  TROPONINI 0.15* 0.15* 0.13*   CBG:  Recent Labs Lab 09/12/14 1238 09/12/14 1650 09/12/14 2111  GLUCAP 132* 143* 121*   Hemoglobin A1C:  Recent Labs Lab 09/12/14 1110  HGBA1C 5.9*   Coagulation:  Recent Labs Lab 09/13/14 0400  LABPROT 13.8  INR  1.05   Anemia Panel:  Recent Labs Lab 09/12/14 1100  VITAMINB12 630  FOLATE 10.9  FERRITIN 522*  TIBC 161*  IRON 76  RETICCTPCT 0.6   Other results: EKG: normal sinus rhythm, TWI in I, V4 and V6 with some T wave flattening in II, AVF and V5 changed from previous.  EKG 09/13/14 during episode of CP: rate 82, normal sinus rhythm, TWI in lateral leads  Micro Results: Recent Results (from the past 240 hour(s))  MRSA PCR Screening     Status: Abnormal   Collection Time: 09/12/14  6:45 PM  Result Value Ref Range Status   MRSA by PCR POSITIVE (A) NEGATIVE Final    Comment:        The GeneXpert MRSA Assay (FDA approved for NASAL specimens only), is one component of a comprehensive MRSA colonization surveillance program. It is not intended to diagnose MRSA infection nor to guide or monitor treatment for MRSA infections. RESULT CALLED TO, READ BACK BY AND VERIFIED WITH: Aggie Cosier RN 2035 09/12/14 A BROWNING    Studies/Results: Dg Chest Port 1 View  09/12/2014   CLINICAL DATA:  Left chest pain and shortness of breath. Dialysis for 9 years.  EXAM: PORTABLE  CHEST - 1 VIEW  COMPARISON:  09/27/2013  FINDINGS: Mild cardiac enlargement without significant vascular congestion. Infiltration or atelectasis in both lung bases. Probable small left pleural effusion. No pneumothorax. Mediastinal contours appear intact.  IMPRESSION: Infiltration or atelectasis in both lung bases with small left pleural effusion.   Electronically Signed   By: Lucienne Capers M.D.   On: 09/12/2014 03:24   Medications: I have reviewed the patient's current medications. Scheduled Meds: . amLODipine  5 mg Oral QHS  . aspirin  81 mg Oral Daily  . atorvastatin  80 mg Oral q1800  . calcium acetate  2,001 mg Oral TID WC  . Chlorhexidine Gluconate Cloth  6 each Topical Q0600  . insulin aspart  0-9 Units Subcutaneous TID WC  . insulin glargine  13 Units Subcutaneous QHS  . lanthanum  1,000 mg Oral TID WC  .  metoprolol  5 mg Intravenous Once  . metoprolol tartrate  12.5 mg Oral BID  . mupirocin ointment   Nasal BID  . pantoprazole  40 mg Oral Daily   Continuous Infusions: . heparin 1,000 Units/hr (09/13/14 0542)   PRN Meds:.acetaminophen, gi cocktail, morphine injection, ondansetron (ZOFRAN) IV Assessment/Plan: Principal Problem:   Chest pain Active Problems:   ESRD on dialysis   Diabetes  [name] is a 39 yo man with a history of ESRD on HD, DM, HTN who presented with atypical chest pain that was relieved by SL nitroglycerin.   Atypical Chest Pain: Chest pain is 3 days in duration and relieved with sublingual nitroglycerin in a patient with multiple risk factors. Positive troponins that are trending down. Repeat EKG on 1/28 with continued TWI. Chest pain early this morning that resolved. - Appreciate cardiology following - Patient to go for cath today - Continue heparin drip - Morphine PRN for pain - Continue GI cocktail (has not been helping)  ESRD: Patient receives dialysis MWF. Due today. - HD today - Appreciate renal following  Hypertension: Currently 192/101.  - Continue amlodipine 5 mg daily and metoprolol 12.5 mg daily (with extra IV doses overnight) - Monitor BP closely today  Anemia: Hemoglobin 10-11 at baseline and currently 9.2. Patient has had symptoms of dizziness, light-headedness, palpitations. FOBT negative. - Trend CBC - Consider aranesp  Diabetes Mellitus Type 2: HgbA1c 5.9%. - Continue lantus 13 U QHS  Dispo: Disposition is deferred at this time, awaiting improvement of current medical problems.  Anticipated discharge in approximately 1-2 day(s).   The patient does have a current PCP Placido Sou, MD) and does not need an [facility] hospital follow-up appointment after discharge.  The patient does not have transportation limitations that hinder transportation to clinic appointments.  .Services Needed at time of discharge: Y = Yes, Blank = No PT:   OT:     RN:   Equipment:   Other:     LOS: 1 day   Drucilla Schmidt, MD 09/13/2014, 6:51 AM

## 2014-09-13 NOTE — Progress Notes (Signed)
Pt signed off 90 mins early due to discomfort at surgical site and restelessness. MD aware. Pt. Still hypertensive at end of HD tx. Report called to Matt Holmes RN 3W.

## 2014-09-14 LAB — CBC
HCT: 30 % — ABNORMAL LOW (ref 39.0–52.0)
Hemoglobin: 10.1 g/dL — ABNORMAL LOW (ref 13.0–17.0)
MCH: 27.2 pg (ref 26.0–34.0)
MCHC: 33.7 g/dL (ref 30.0–36.0)
MCV: 80.6 fL (ref 78.0–100.0)
Platelets: 220 10*3/uL (ref 150–400)
RBC: 3.72 MIL/uL — ABNORMAL LOW (ref 4.22–5.81)
RDW: 14.5 % (ref 11.5–15.5)
WBC: 6.9 10*3/uL (ref 4.0–10.5)

## 2014-09-14 LAB — GLUCOSE, CAPILLARY
GLUCOSE-CAPILLARY: 100 mg/dL — AB (ref 70–99)
GLUCOSE-CAPILLARY: 94 mg/dL (ref 70–99)
Glucose-Capillary: 89 mg/dL (ref 70–99)
Glucose-Capillary: 114 mg/dL — ABNORMAL HIGH (ref 70–99)

## 2014-09-14 LAB — BASIC METABOLIC PANEL
Anion gap: 10 (ref 5–15)
BUN: 34 mg/dL — ABNORMAL HIGH (ref 6–23)
CALCIUM: 8.8 mg/dL (ref 8.4–10.5)
CHLORIDE: 98 mmol/L (ref 96–112)
CO2: 26 mmol/L (ref 19–32)
Creatinine, Ser: 10.01 mg/dL — ABNORMAL HIGH (ref 0.50–1.35)
GFR calc non Af Amer: 6 mL/min — ABNORMAL LOW (ref >90)
GFR, EST AFRICAN AMERICAN: 7 mL/min — AB (ref >90)
GLUCOSE: 142 mg/dL — AB (ref 70–99)
Potassium: 3.8 mmol/L (ref 3.5–5.1)
Sodium: 134 mmol/L — ABNORMAL LOW (ref 135–145)

## 2014-09-14 LAB — HEPATITIS B SURFACE ANTIGEN: Hepatitis B Surface Ag: NEGATIVE

## 2014-09-14 MED ORDER — HYDRALAZINE HCL 25 MG PO TABS
25.0000 mg | ORAL_TABLET | Freq: Three times a day (TID) | ORAL | Status: DC
  Administered 2014-09-14: 25 mg via ORAL
  Filled 2014-09-14: qty 1

## 2014-09-14 NOTE — Progress Notes (Signed)
Subjective: No complaints this AM. Denies chest pain, SOB, headache, dizziness, lightheadedness, or palpitations.   Objective: Vital signs in last 24 hours: Filed Vitals:   09/13/14 1932 09/13/14 2200 09/14/14 0600 09/14/14 0637  BP: 173/96 155/81 175/97 175/97  Pulse: 76   75  Temp: 97.4 F (36.3 C)   97.7 F (36.5 C)  TempSrc: Oral   Oral  Resp: 18   18  Height:      Weight: 185 lb 10 oz (84.2 kg)     SpO2: 94%   100%   Weight change: 9 lb 15.9 oz (4.534 kg)  Intake/Output Summary (Last 24 hours) at 09/14/14 0802 Last data filed at 09/14/14 0600  Gross per 24 hour  Intake    400 ml  Output   3026 ml  Net  -2626 ml   Physical Exam: General: AA male, alert, cooperative, NAD. HEENT: PERRL, EOMI. Moist mucus membranes Neck: Full range of motion without pain, supple, no lymphadenopathy or carotid bruits Lungs: Clear to ascultation bilaterally, normal work of respiration, no wheezes, rales, rhonchi Heart: RRR, no murmurs, gallops, or rubs Abdomen: Soft, non-tender, non-distended, BS + Extremities: No cyanosis, clubbing, or edema. LUE AVF w/ good thrill/bruit. Neurologic: Alert & oriented X3, cranial nerves II-XII intact, strength grossly intact, sensation intact to light touch    Lab Results: Basic Metabolic Panel:  Recent Labs Lab 09/13/14 0317 09/13/14 1525  NA 139 137  K 3.5 3.6  CL 97 96  CO2 33* 30  GLUCOSE 107* 83  BUN 27* 32*  CREATININE 10.70* 11.72*  CALCIUM 8.8 8.8  PHOS 5.2* 5.3*   Liver Function Tests:  Recent Labs Lab 09/13/14 0317 09/13/14 1525  ALBUMIN 3.1* 3.6   CBC:  Recent Labs Lab 09/12/14 0301 09/13/14 0317  WBC 5.4 6.1  NEUTROABS 2.4  --   HGB 8.9* 9.2*  HCT 27.2* 27.4*  MCV 81.0 81.1  PLT 157 188   Cardiac Enzymes:  Recent Labs Lab 09/12/14 2335 09/13/14 0724 09/13/14 1823  TROPONINI 0.13* 0.13* 0.11*   CBG:  Recent Labs Lab 09/12/14 1238 09/12/14 1650 09/12/14 2111 09/13/14 0754 09/13/14 2105  GLUCAP  132* 143* 121* 88 165*   Hemoglobin A1C:  Recent Labs Lab 09/12/14 1110  HGBA1C 5.9*   Coagulation:  Recent Labs Lab 09/13/14 0400  LABPROT 13.8  INR 1.05   Anemia Panel:  Recent Labs Lab 09/12/14 1100  VITAMINB12 630  FOLATE 10.9  FERRITIN 522*  TIBC 161*  IRON 76  RETICCTPCT 0.6    Micro Results: Recent Results (from the past 240 hour(s))  MRSA PCR Screening     Status: Abnormal   Collection Time: 09/12/14  6:45 PM  Result Value Ref Range Status   MRSA by PCR POSITIVE (A) NEGATIVE Final    Comment:        The GeneXpert MRSA Assay (FDA approved for NASAL specimens only), is one component of a comprehensive MRSA colonization surveillance program. It is not intended to diagnose MRSA infection nor to guide or monitor treatment for MRSA infections. RESULT CALLED TO, READ BACK BY AND VERIFIED WITH: Aggie Cosier RN 2035 09/12/14 A BROWNING     Medications: I have reviewed the patient's current medications. Scheduled Meds: . amLODipine  5 mg Oral QHS  . aspirin  81 mg Oral Daily  . atorvastatin  80 mg Oral q1800  . calcitRIOL  1.25 mcg Oral Q M,W,F-HD  . calcium acetate  2,001 mg Oral TID WC  . Chlorhexidine  Gluconate Cloth  6 each Topical [id number]  . darbepoetin (ARANESP) injection - DIALYSIS  25 mcg Intravenous Q Fri-HD  . heparin  5,000 Units Subcutaneous 3 times per day  . insulin aspart  0-9 Units Subcutaneous TID WC  . insulin glargine  13 Units Subcutaneous QHS  . lanthanum  1,000 mg Oral TID WC  . lisinopril  10 mg Oral Daily  . metoprolol tartrate  25 mg Oral BID  . multivitamin  1 tablet Oral QHS  . mupirocin ointment   Nasal BID  . pantoprazole  40 mg Oral Daily  . sodium chloride  3 mL Intravenous Q12H   Continuous Infusions:   PRN Meds:.sodium chloride, acetaminophen, feeding supplement (NEPRO CARB STEADY), gi cocktail, heparin, heparin, lidocaine (PF), lidocaine-prilocaine, morphine injection, ondansetron (ZOFRAN) IV, oxyCODONE-acetaminophen,  pentafluoroprop-tetrafluoroeth, sodium chloride   Assessment/Plan: 39 y/o w/ PMHx of DM type II, HTN, ESRD on HD, admitted for chest pain.  Chest Pain: Still unclear etiology. S/p cardiac catheterization yesterday. 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 Etiology is uncertain but likely hypertension related. Also significant for heavily calcified but widely patent coronary arteries. The mid to distal LAD contains an eccentric 50-60%, and the continuation of the circumflex beyond the second marginal contains 90% stenosis. The circumflex beyond the second marginal is very small in distribution and is not clinically relevant. -Cardiology following; appreciate recs. For ECHO today.  -Continue Norvasc 5 mg qhs -Increased Metoprolol to 25 mg bid -Added Lisinopril 10 mg daily + Hydralazine 25 mg tid -Morphine prn for pain -GI cocktail prn  ESRD: HD MWF.  -Renal following; appreciate management -HD today.   Hypertension: Still elevated this AM.   -BP medications as above; Norvasc 5 mg qhs, Metoprolol to 25 mg bid, Lisinopril 10 mg daily and Hydralazine 25 mg tid  Anemia: CBC trend as follows:   Recent Labs Lab 09/12/14 0301 09/13/14 0317 09/14/14 1029  HGB 8.9* 9.2* 10.1*  HCT 27.2* 27.4* 30.0*  WBC 5.4 6.1 6.9  PLT 157 188 220  -Aranesp per renal  DM Type 2: HgbA1c 5.9%. -Continue lantus 13 units qhs  Dispo: Disposition is deferred at this time, awaiting improvement of current medical problems.  Anticipated discharge in approximately 1-2 day(s).   The patient does have a current PCP Placido Sou, MD) and does not need an [facility] hospital follow-up appointment after discharge.  The patient does not have transportation limitations that hinder transportation to clinic appointments.  .Services Needed at time of discharge: Y = Yes, Blank = No PT:   OT:   RN:     Equipment:   Other:     LOS: 2 days   Corky Sox, MD 09/14/2014, 8:02 AM

## 2014-09-14 NOTE — Progress Notes (Signed)
Subjective:  Says he feels good today, no chest pain at the present time.  Dialyzed yesterday.  Objective:  Vital Signs in the last 24 hours: BP 182/100 mmHg  Pulse 80  Temp(Src) 97.7 F (36.5 C) (Oral)  Resp 18  Ht 6\' 2"  (1.88 m)  Wt 84.2 kg (185 lb 10 oz)  BMI 23.82 kg/m2  SpO2 100%  Physical Exam: Young black male currently in no acute distress Lungs:  Clear Cardiac:  Regular rhythm, normal S1 and S2, no S3, no rub heard Extremities: Catheterization site is clean and dry  Intake/Output from previous day: 01/29 0701 - 01/30 0700 In: 400 [P.O.:400] Out: 3026   Weight Filed Weights   09/12/14 0822 09/13/14 1655 09/13/14 1932  Weight: 81.466 kg (179 lb 9.6 oz) 86 kg (189 lb 9.5 oz) 84.2 kg (185 lb 10 oz)    Lab Results: Basic Metabolic Panel:  Recent Labs  09/13/14 0317 09/13/14 1525  NA 139 137  K 3.5 3.6  CL 97 96  CO2 33* 30  GLUCOSE 107* 83  BUN 27* 32*  CREATININE 10.70* 11.72*   CBC:  Recent Labs  09/12/14 0301 09/13/14 0317  WBC 5.4 6.1  NEUTROABS 2.4  --   HGB 8.9* 9.2*  HCT 27.2* 27.4*  MCV 81.0 81.1  PLT 157 188   Cardiac Enzymes:  Recent Labs  09/12/14 2335 09/13/14 0724 09/13/14 1823  TROPONINI 0.13* 0.13* 0.11*    Telemetry: Sinus rhythm  Assessment/Plan:  1.  Chest pain with some atypical features but has resolved 2.  Coronary artery disease, not significant for medical treatment. 3.  Reduced LV function consistent with cardiomyopathy possibly due to hypertension. 4.  Low level troponin elevations of uncertain cause, likely due to renal failure and decreased LV function  Recommendations:  He will need excellent blood pressure control.  Suggest the addition of hydralazine to his regimen, beta blocker because of decreased LV function and continued medical treatment.  Check echocardiogram.      Kerry Hough  MD [facility]  09/14/2014, 10:56 AM

## 2014-09-14 NOTE — Progress Notes (Signed)
Patients BP 175/97, resident on call paged and made aware

## 2014-09-14 NOTE — Progress Notes (Signed)
Subjective:   No cos tolerated hd yesterday/ no sob. No cp  Objective Vital signs in last 24 hours: Filed Vitals:   09/13/14 2200 09/14/14 0600 09/14/14 0637 09/14/14 1019  BP: 155/81 175/97 175/97 182/100  Pulse:   75 80  Temp:   97.7 F (36.5 C)   TempSrc:   Oral   Resp:   18   Height:      Weight:      SpO2:   100%    Weight change: 4.534 kg (9 lb 15.9 oz)    Physical Exam: General:  NAD, ox3 normal ms alert appropriate  Lungs: CTA bilat, nonlabored Heart: RRR / soft 1/6 sem lsb  , no  gallop or rub.  Abdomen: Soft, non-tender, non-distended.  Lower extremities: No LE edema.  Dialysis Access: Left AVF +b/t.  Dialysis Orders:  [facility] MWF  4 hr 500/800 EDW 82 2K/2Ca Heparin 8000 L AVF Calcitriol 1.25 mcg MWF  Recent Labs: Hgb 11.6 (1/20), P 5.0 (1/13), TSAT 31 and PTH 294 (1/16)   Problem/Plan: 1. Chest Pain:  Card Cath = yesterday showing  combined systolic and diastolic heart failure with estimated EF of 35-40%,/ and calcified but patent coronary arteries.  2. Accelerated HTN / volume overload : very high filling pressures at time of heart cath, BP 199/105 in ED, currently 182/100. Amlodipine 5mg  hs and metoprolol 25 mg bid / lisinopril 10mg  added/ needs vol off with hd stll 2.4 kg >edw but not sob now    3 ESRD: MWF. HD today for extra tx for vol  Removal k 3.8   4, Anemia: Hgb 9.2.. 10.1 Restarted 25 mcg Aranesp q Fri   5. 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 Nutrition: albumin 3.1needs Carb mod renal failure / renal vit.   Ernest Haber, PA-C [place] [phone] 09/14/2014,1:14 PM  LOS: 2 days   Pt seen, examined, agree w assess/plan as above with additions as indicated.  Kelly Splinter MD pager [phone]    cell [phone] 09/14/2014, 3:43 PM     Labs: Basic Metabolic Panel:  Recent Labs Lab 09/13/14 0317 09/13/14 1525 09/14/14 1029  NA 139 137 134*  K 3.5  3.6 3.8  CL 97 96 98  CO2 33* 30 26  GLUCOSE 107* 83 142*  BUN 27* 32* 34*  CREATININE 10.70* 11.72* 10.01*  CALCIUM 8.8 8.8 8.8  PHOS 5.2* 5.3*  --    Liver Function Tests:  Recent Labs Lab 09/13/14 0317 09/13/14 1525  ALBUMIN 3.1* 3.6  CBC:  Recent Labs Lab 09/12/14 0301 09/13/14 0317 09/14/14 1029  WBC 5.4 6.1 6.9  NEUTROABS 2.4  --   --   HGB 8.9* 9.2* 10.1*  HCT 27.2* 27.4* 30.0*  MCV 81.0 81.1 80.6  PLT 157 188 220   Cardiac Enzymes:  Recent Labs Lab 09/12/14 1100 09/12/14 1625 09/12/14 2335 09/13/14 0724 09/13/14 1823  TROPONINI 0.15* 0.15* 0.13* 0.13* 0.11*   CBG:  Recent Labs Lab 09/12/14 2111 09/13/14 0754 09/13/14 2105 09/14/14 0754 09/14/14 1210  GLUCAP 121* 88 165* 100* 94  Medications:   . amLODipine  5 mg Oral QHS  . aspirin  81 mg Oral Daily  . atorvastatin  80 mg Oral q1800  . calcitRIOL  1.25 mcg Oral Q M,W,F-HD  . calcium acetate  2,001 mg Oral TID WC  . Chlorhexidine Gluconate Cloth  6 each Topical Q0600  . darbepoetin (ARANESP) injection -  DIALYSIS  25 mcg Intravenous Q Fri-HD  . heparin  5,000 Units Subcutaneous 3 times per day  . hydrALAZINE  25 mg Oral TID  . insulin aspart  0-9 Units Subcutaneous TID WC  . insulin glargine  13 Units Subcutaneous QHS  . lanthanum  1,000 mg Oral TID WC  . lisinopril  10 mg Oral Daily  . metoprolol tartrate  25 mg Oral BID  . multivitamin  1 tablet Oral QHS  . mupirocin ointment   Nasal BID  . pantoprazole  40 mg Oral Daily  . sodium chloride  3 mL Intravenous Q12H      \

## 2014-09-15 DIAGNOSIS — I132 Hypertensive heart and chronic kidney disease with heart failure and with stage 5 chronic kidney disease, or end stage renal disease: Secondary | ICD-10-CM

## 2014-09-15 DIAGNOSIS — I5041 Acute combined systolic (congestive) and diastolic (congestive) heart failure: Secondary | ICD-10-CM

## 2014-09-15 DIAGNOSIS — Z7982 Long term (current) use of aspirin: Secondary | ICD-10-CM

## 2014-09-15 LAB — GLUCOSE, CAPILLARY
GLUCOSE-CAPILLARY: 99 mg/dL (ref 70–99)
GLUCOSE-CAPILLARY: 82 mg/dL (ref 70–99)
Glucose-Capillary: 67 mg/dL — ABNORMAL LOW (ref 70–99)
Glucose-Capillary: 132 mg/dL — ABNORMAL HIGH (ref 70–99)

## 2014-09-15 MED ORDER — ATORVASTATIN CALCIUM 80 MG PO TABS: 80.0000 mg | ORAL_TABLET | Freq: Every day | ORAL | Status: DC

## 2014-09-15 MED ORDER — PANTOPRAZOLE SODIUM 40 MG PO TBEC: 40.0000 mg | DELAYED_RELEASE_TABLET | Freq: Every day | ORAL | Status: DC

## 2014-09-15 MED ORDER — ASPIRIN 81 MG PO CHEW: 81.0000 mg | CHEWABLE_TABLET | Freq: Every day | ORAL | Status: DC

## 2014-09-15 MED ORDER — HYDRALAZINE HCL 25 MG PO TABS: 25.0000 mg | ORAL_TABLET | Freq: Three times a day (TID) | ORAL | Status: DC

## 2014-09-15 MED ORDER — INSULIN GLARGINE 100 UNIT/ML ~~LOC~~ SOLN
10.0000 [IU] | Freq: Every day | SUBCUTANEOUS | Status: DC
  Filled 2014-09-15: qty 0.1

## 2014-09-15 MED ORDER — LISINOPRIL 10 MG PO TABS: 10.0000 mg | ORAL_TABLET | Freq: Every day | ORAL | Status: DC

## 2014-09-15 MED ORDER — INSULIN GLARGINE 100 UNIT/ML ~~LOC~~ SOLN: 10.0000 [IU] | Freq: Every day | SUBCUTANEOUS | Status: DC

## 2014-09-15 MED ORDER — METOPROLOL TARTRATE 25 MG PO TABS: 25.0000 mg | ORAL_TABLET | Freq: Two times a day (BID) | ORAL | Status: DC

## 2014-09-15 NOTE — Progress Notes (Signed)
  Echocardiogram 2D Echocardiogram has been performed.  [name] 09/15/2014, 9:27 AM

## 2014-09-15 NOTE — Discharge Instructions (Addendum)
1. Please follow-up with [facility] in one week.  Dr. Kyla Balzarine office will call you with you cardiology appointment date and time.  Please be sure to go to this appointment.  Go to hemodialysis tomorrow like you normally do on Mondays.   2. Please take all medications as prescribed.  The medications listed below are new meds we have prescribed.  Please pick them up at your pharmacy.    aspirin 81 MG chewable tablet  -  atorvastatin 80 MG tablet  -  hydrALAZINE 25 MG tablet  -  lisinopril 10 MG tablet  -  metoprolol tartrate 25 MG tablet  -  pantoprazole 40 MG tablet   I lowered your insulin dose to 10 units because your blood sugar was low last night.  Please arrange care with a primary care doctor (we have given you contact information for [facility]) so your blood sugar and insulin dosing can be monitored.  3. If you have worsening of your symptoms or new symptoms arise, please call the clinic [phone]), or go to the ER immediately if symptoms are severe.

## 2014-09-15 NOTE — Discharge Summary (Signed)
Patient Name:  [name] Denault Sr.  MRN: [id number]  PCP: Placido Sou, MD  DOB:  1975-12-13       Date of Admission:  09/12/2014  Date of Discharge:  09/15/2014      Attending Physician: Dr. Aldine Contes, MD         DISCHARGE DIAGNOSES: 1.   Chest pain 2.   Essential hypertension 3.   Combined systolic and diastolic heart failure 4.   ESRD on dialysis 5.   Diabetes     DISPOSITION AND FOLLOW-UP: [name] Labella Sr. is to follow-up with the listed providers as detailed below, at patient's visiting, please address following issues:  1) BP control 2) compliance with HF medication  Discharge Instructions    Call MD for:  difficulty breathing, headache or visual disturbances    Complete by:  As directed      Call MD for:  extreme fatigue    Complete by:  As directed      Call MD for:  hives    Complete by:  As directed      Call MD for:  persistant dizziness or light-headedness    Complete by:  As directed      Call MD for:  persistant nausea and vomiting    Complete by:  As directed      Call MD for:  redness, tenderness, or signs of infection (pain, swelling, redness, odor or green/yellow discharge around incision site)    Complete by:  As directed      Call MD for:  severe uncontrolled pain    Complete by:  As directed      Call MD for:  temperature >100.4    Complete by:  As directed      Diet - low sodium heart healthy    Complete by:  As directed      Increase activity slowly    Complete by:  As directed             DISCHARGE MEDICATIONS:   Medication List    STOP taking these medications        HYDROcodone-acetaminophen 5-325 MG per tablet  Commonly known as:  NORCO/VICODIN     oxyCODONE-acetaminophen 5-325 MG per tablet  Commonly known as:  ROXICET      TAKE these medications        amLODipine 5 MG tablet  Commonly known as:  NORVASC  Take 1 tablet (5 mg total) by mouth at bedtime.     aspirin 81 MG chewable tablet    Chew 1 tablet (81 mg total) by mouth daily.     atorvastatin 80 MG tablet  Commonly known as:  LIPITOR  Take 1 tablet (80 mg total) by mouth daily at 6 PM.     calcium acetate 667 MG capsule  Commonly known as:  PHOSLO  Take 3 capsules (2,001 mg total) by mouth 3 (three) times daily with meals.     hydrALAZINE 25 MG tablet  Commonly known as:  APRESOLINE  Take 1 tablet (25 mg total) by mouth 3 (three) times daily.     insulin glargine 100 UNIT/ML injection  Commonly known as:  LANTUS  Inject 0.1 mLs (10 Units total) into the skin at bedtime.     lanthanum 1000 MG chewable tablet  Commonly known as:  FOSRENOL  Chew 1 tablet (1,000 mg total) by mouth 3 (three) times daily with meals.     lisinopril 10 MG tablet  Commonly known as:  PRINIVIL,ZESTRIL  Take 1 tablet (10 mg total) by mouth daily.     methocarbamol 500 MG tablet  Commonly known as:  ROBAXIN  Take 1 tablet (500 mg total) by mouth 2 (two) times daily as needed for muscle spasms.     metoprolol tartrate 25 MG tablet  Commonly known as:  LOPRESSOR  Take 1 tablet (25 mg total) by mouth 2 (two) times daily.     pantoprazole 40 MG tablet  Commonly known as:  PROTONIX  Take 1 tablet (40 mg total) by mouth daily.         CONSULTS:  Cardiology Nephrology   PROCEDURES PERFORMED:  Dg Chest Port 1 View  09/12/2014   CLINICAL DATA:  Left chest pain and shortness of breath. Dialysis for 9 years.  EXAM: PORTABLE CHEST - 1 VIEW  COMPARISON:  09/27/2013  FINDINGS: Mild cardiac enlargement without significant vascular congestion. Infiltration or atelectasis in both lung bases. Probable small left pleural effusion. No pneumothorax. Mediastinal contours appear intact.  IMPRESSION: Infiltration or atelectasis in both lung bases with small left pleural effusion.   Electronically Signed   By: Lucienne Capers M.D.   On: 09/12/2014 03:24     Left heart catheterization/coronary angiography/left ventriculography:  IMPRESSIONS: 1.  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 Etiology is uncertain but likely hypertension related. 2. Heavily calcified but widely patent coronary arteries. The mid to distal LAD contains an eccentric 50-60%, and the continuation of the circumflex beyond the second marginal contains 90% stenosis. The circumflex beyond the second marginal is very small in distribution and is not clinically relevant. 3. Prolonged chest pain of uncertain etiology 4. Elevated troponin related to end-stage renal disease and cardiomyopathy.   ADMISSION DATA: H&P: [name] is a 39 yo man pmh ESRD on HD x9 years 2/2 diabetes, DM, and HTN presents with worsening left sided chest pain. Pt states the pain is sharp in nature at its worse 7-8/10 and not provoked by movement or activity and not relieved by anything including rest, although the pain was relieved with NTG in the ED. Pt states this chest pain started about 3 days ago in the same left sided chest area that was sharp in nature w/o radiation and associated with nausea and some diaphoresis and SOB but no vomiting. He originally thought it was indigestion but it didn't get better with antacids. He is a smoker 1/2ppd for the past 20 years (10 pack year) and his mother had CHF and was also on HD before her death. He denied every having this type of pain before but has had recent stress tests and was due for another repeat in 2015 but was unable to make that appointment. He doesn't take an ASA or HLD meds. He has been compliant with his HD sessions and has not any complications but thinks for the past sessions he has been hypertensive which is new for him and recently started back on his meds. His SM has been under good control without any changes. He denied any abdominal pain, fever/chills, DOE, rashes, PND or LE swelling.   Physical Exam: Blood pressure  172/107, pulse 85, temperature 98.4 F (36.9 C), temperature source Oral, resp. rate 18, height 6\' 2"  (1.88 m), weight 179 lb 9.6 oz (81.466 kg), SpO2 99 %. General: resting in bed, NAD HEENT: PERRL, EOMI, no scleral icterus Cardiac: RRR, no rubs,  2/6 systolic murmur best LSB w/o radiation, no gallops Pulm: clear to auscultation bilaterally, no crackles/wheezes, or rhonchi, moving normal volumes of air Abd: soft, nontender, nondistended, BS present Ext: warm and well perfused, no pedal edema, left UE fistula with palpable thrill and audible bruit Neuro: alert and oriented X3, cranial nerves II-XII grossly intact  Labs: Basic Metabolic Panel:  Recent Labs (last 2 labs)      Recent Labs  09/12/14 0301  NA 136  K 2.8*  CL 97  CO2 30  GLUCOSE 156*  BUN 10  CREATININE 7.84*  CALCIUM 8.6     CBC:  Recent Labs (last 2 labs)      Recent Labs  09/12/14 0301  WBC 5.4  NEUTROABS 2.4  HGB 8.9*  HCT 27.2*  MCV 81.0  PLT 157     Cardiac Enzymes:  Recent Labs (last 2 labs)      Recent Labs  09/12/14 0301  TROPONINI 0.20*     HOSPITAL COURSE: Atypical chest pain:  Patient admitted with atypical chest pain and several CAD risk factors.  There were no acute ST/T changes on EKG.  Initial troponin was 0.20 and trended down to 0.11 during the first 24 hours of admission.  The slight elevation in troponin likely due to ESRD.  Morphine was provided for pain relief as needed.  Given his risk factors the patient went for left heart catheterization and angiogram on hospital day 2.  Cath revealed heavily calcified but patent coronaries and was significant for combined systolic and diastolic heart failure with an estimated EF of 35-40%.  The etiology of the heart failure is thought to be hypertension.  He was taking amlodipine prior to admission and this was continued.  Metoprolol to 25 mg bid, lisinopril 10 mg daily and hydralazine 25 mg tid were added to  his regimen.  ASA and lipitor were continued at discharge.    ESRD on HD:  Nephrology was consulted and the patient was dialyzed during his admission.  Nephrology will inform outpatient HD center of possible reduction in patient's EDW.   DM type 2:  The patient takes 13 units qhs.  This was initially continued but AM CBG was 67 on day of discharge so Lantus was reduced to 10 units at discharge.  His requirements my decrease given ESRD on HD.   Hypertension:  His BP was elevated at admission to 190s SBP.  BP/HF managed with Norvasc 5 mg qhs, Metoprolol to 25 mg bid, Lisinopril 10 mg daily and Hydralazine 25 mg tid. These medications were continued at discharge.   Combined systolic and diastolic heart failure:  As noted above, EF 35-40% via ventriculogram, however EF 50-55% on 2D ECHO.  Patient was started on the above medications during admission.  He will follow-up with cardiology on 10/04/14.    DISCHARGE DATA: Vital Signs: BP 171/84 mmHg  Pulse 83  Temp(Src) 98.2 F (36.8 C) (Oral)  Resp 18  Ht 6\' 2"  (1.88 m)  Wt 84.006 kg (185 lb 3.2 oz)  BMI 23.77 kg/m2  SpO2 98%  Labs: Results for orders placed or performed during the hospital encounter of 09/12/14 (from the past 24 hour(s))  Glucose, capillary     Status: None   Collection Time: 09/14/14  4:54 PM  Result Value Ref Range   Glucose-Capillary 89 70 - 99 mg/dL   Comment 1 Notify RN   Glucose, capillary     Status: Abnormal   Collection Time: 09/14/14  8:42 PM  Result Value Ref Range   Glucose-Capillary 114 (H) 70 - 99 mg/dL   Comment 1 Notify RN   Glucose, capillary     Status: Abnormal   Collection Time: 09/15/14  3:41 AM  Result Value Ref Range   Glucose-Capillary 67 (L) 70 - 99 mg/dL  Glucose, capillary     Status: None   Collection Time: 09/15/14  4:02 AM  Result Value Ref Range   Glucose-Capillary 99 70 - 99 mg/dL  Glucose, capillary     Status: Abnormal   Collection Time: 09/15/14  7:57 AM  Result Value Ref Range     Glucose-Capillary 132 (H) 70 - 99 mg/dL   Comment 1 Notify RN   Glucose, capillary     Status: None   Collection Time: 09/15/14 11:58 AM  Result Value Ref Range   Glucose-Capillary 82 70 - 99 mg/dL   Comment 1 Notify RN      Services Ordered on Discharge: Y = Yes; Blank = No PT:   OT:   RN:   Equipment:   Other:      Time Spent on Discharge: 35 min   Signed: Duwaine Maxin PGY 2, Internal Medicine Resident 09/15/2014, 3:02 PM

## 2014-09-15 NOTE — Progress Notes (Signed)
Subjective: No acute events overnight.  Patient seen and examined this AM.  RN and patient report he was not dialyzed yesterday as planned but is supposed to go for HD this AM.  The patient denies chest pain or dyspnea.  His appetite/bowels are normal.  He had diarrhea when admitted but says that resolved two days ago and he had normal BM yesterday.  He does not make urine.   Objective: Vital signs in last 24 hours: Filed Vitals:   09/14/14 1019 09/14/14 1430 09/14/14 2038 09/15/14 0649  BP: 182/100 163/86 171/89 140/64  Pulse: 80 81 84 77  Temp:  98.4 F (36.9 C) 98.8 F (37.1 C) 98.4 F (36.9 C)  TempSrc:  Oral Oral Oral  Resp:  20 20 18   Height:      Weight:    84.006 kg (185 lb 3.2 oz)  SpO2:  97% 98% 95%   Weight change: -1.994 kg (-4 lb 6.3 oz)  Intake/Output Summary (Last 24 hours) at 09/15/14 0813 Last data filed at 09/14/14 1800  Gross per 24 hour  Intake    720 ml  Output      0 ml  Net    720 ml   Physical Exam: General: AA male, alert, cooperative, NAD. HEENT: moist mucus membranes, [place]/AT Neck: supple  Lungs: Clear to ascultation bilaterally, normal work of respiration, no wheezes, rales, rhonchi Heart: RRR, no murmurs, gallops, or rubs Abdomen: Soft, non-tender, non-distended, BS + Extremities: No cyanosis, clubbing, or edema. LUE AVF w/ good palpable thrill Neurologic: Alert & oriented X3, responding appropriately, following commands, able to sit up for lung exam, strength grossly intact upper and lower extremities  Lab Results: Basic Metabolic Panel:  Recent Labs Lab 09/13/14 0317 09/13/14 1525 09/14/14 1029  NA 139 137 134*  K 3.5 3.6 3.8  CL 97 96 98  CO2 33* 30 26  GLUCOSE 107* 83 142*  BUN 27* 32* 34*  CREATININE 10.70* 11.72* 10.01*  CALCIUM 8.8 8.8 8.8  PHOS 5.2* 5.3*  --    Liver Function Tests:  Recent Labs Lab 09/13/14 0317 09/13/14 1525  ALBUMIN 3.1* 3.6   CBC:  Recent Labs Lab 09/12/14 0301 09/13/14 0317 09/14/14 1029   WBC 5.4 6.1 6.9  NEUTROABS 2.4  --   --   HGB 8.9* 9.2* 10.1*  HCT 27.2* 27.4* 30.0*  MCV 81.0 81.1 80.6  PLT 157 188 220   Cardiac Enzymes:  Recent Labs Lab 09/12/14 2335 09/13/14 0724 09/13/14 1823  TROPONINI 0.13* 0.13* 0.11*   CBG:  Recent Labs Lab 09/14/14 1210 09/14/14 1654 09/14/14 2042 09/15/14 0341 09/15/14 0402 09/15/14 0757  GLUCAP 94 89 114* 67* 99 132*   Hemoglobin A1C:  Recent Labs Lab 09/12/14 1110  HGBA1C 5.9*   Coagulation:  Recent Labs Lab 09/13/14 0400  LABPROT 13.8  INR 1.05   Anemia Panel:  Recent Labs Lab 09/12/14 1100  VITAMINB12 630  FOLATE 10.9  FERRITIN 522*  TIBC 161*  IRON 76  RETICCTPCT 0.6    Micro Results: Recent Results (from the past 240 hour(s))  MRSA PCR Screening     Status: Abnormal   Collection Time: 09/12/14  6:45 PM  Result Value Ref Range Status   MRSA by PCR POSITIVE (A) NEGATIVE Final    Comment:        The GeneXpert MRSA Assay (FDA approved for NASAL specimens only), is one component of a comprehensive MRSA colonization surveillance program. It is not intended to diagnose MRSA  infection nor to guide or monitor treatment for MRSA infections. RESULT CALLED TO, READ BACK BY AND VERIFIED WITH: Aggie Cosier RN 2035 09/12/14 A BROWNING     Medications: I have reviewed the patient's current medications. Scheduled Meds: . amLODipine  5 mg Oral QHS  . aspirin  81 mg Oral Daily  . atorvastatin  80 mg Oral q1800  . calcitRIOL  1.25 mcg Oral Q M,W,F-HD  . calcium acetate  2,001 mg Oral TID WC  . Chlorhexidine Gluconate Cloth  6 each Topical Q0600  . darbepoetin (ARANESP) injection - DIALYSIS  25 mcg Intravenous Q Fri-HD  . heparin  5,000 Units Subcutaneous 3 times per day  . hydrALAZINE  25 mg Oral TID  . insulin aspart  0-9 Units Subcutaneous TID WC  . insulin glargine  13 Units Subcutaneous QHS  . lanthanum  1,000 mg Oral TID WC  . lisinopril  10 mg Oral Daily  . metoprolol tartrate  25 mg  Oral BID  . multivitamin  1 tablet Oral QHS  . mupirocin ointment   Nasal BID  . pantoprazole  40 mg Oral Daily  . sodium chloride  3 mL Intravenous Q12H   Continuous Infusions:  none   PRN Meds:.sodium chloride, acetaminophen, feeding supplement (NEPRO CARB STEADY), gi cocktail, heparin, heparin, lidocaine (PF), lidocaine-prilocaine, morphine injection, ondansetron (ZOFRAN) IV, oxyCODONE-acetaminophen, pentafluoroprop-tetrafluoroeth, sodium chloride   Assessment/Plan: 39 y/o w/ PMHx of DM type II, HTN, ESRD on HD, admitted for chest pain.  Chest Pain: Cardiac catheterization 09/13/14 revealed combined systolic and diastolic heart failure with estimated ejection fraction of 35-40% w/ an extremely elevated left ventricular end diastolic pressure; also heavily calcified but widely patent coronary arteries. CP etiology is uncertain but likely hypertension related.   -Cardiology following; appreciate recs.  2D ECHO completed today - awaiting interpretation. -continue Norvasc 5 mg qhs and metoprolol to 25 mg bid, lisinopril 10 mg daily and hydralazine 25 mg tid -morphine prn for pain -GI cocktail prn  ESRD: HD MWF.  HD for excess fluid was planned for yesterday but delayed until today. -Renal following; appreciate management.  Renal will inform outpatient HD center of possible reduction in patient's EDW.  -HD today to remove excess volume; then back to usual MWF HD schedule  Hypertension: Improved to 140/105mmHg this AM.   -BP medications as above; Norvasc 5 mg qhs, Metoprolol to 25 mg bid, Lisinopril 10 mg daily and Hydralazine 25 mg tid - Will see how BP responds after HD today.  Anemia: stable. -Aranesp per renal  DM Type 2: HgbA1c 5.9%.  Home regimen - Lantus 13 units qhs, which has been continued inpatient.  He has not required SSI-S insulin.  Early AM CBG was 67.   -Will decrease Lantus to 10 units given low of 67 last night.  His requirements may decrease given ESRD status.   Dispo:  He will likely d/c home today after HD if he continues to do well and if 2D ECHO has been read.    The patient does have a current PCP Placido Sou, MD) and does not need an [facility] hospital follow-up appointment after discharge.  The patient does not have transportation limitations that hinder transportation to clinic appointments.  .Services Needed at time of discharge: Y = Yes, Blank = No PT:   OT:   RN:   Equipment:   Other:     LOS: 3 days   Francesca Oman, DO 09/15/2014, 8:13 AM

## 2014-09-15 NOTE — Progress Notes (Signed)
Subjective:  No sob or cp for extra hd today  For vol removal  Objective Vital signs in last 24 hours: Filed Vitals:   09/14/14 1019 09/14/14 1430 09/14/14 2038 09/15/14 0649  BP: 182/100 163/86 171/89 140/64  Pulse: 80 81 84 77  Temp:  98.4 F (36.9 C) 98.8 F (37.1 C) 98.4 F (36.9 C)  TempSrc:  Oral Oral Oral  Resp:  20 20 18   Height:      Weight:    84.006 kg (185 lb 3.2 oz)  SpO2:  97% 98% 95%   Weight change: -1.994 kg (-4 lb 6.3 oz)   Physical Exam: General: NAD, ox3  Lungs: CTA bilat, nonlabored Heart: RRR / soft 1/6 sem lsb , no gallop or rub.  Abdomen: Soft, non-tender, non-distended.  Lower extremities: No LE edema.  Dialysis Access: Left AVF +b/t.  Dialysis Orders:  [facility] MWF  4 hr 500/800 EDW 82 2K/2Ca Heparin 8000 L AVF Calcitriol 1.25 mcg MWF  Recent Labs: Hgb 11.6 (1/20), P 5.0 (1/13), TSAT 31 and PTH 294 (1/16)   Problem/Plan: 1. Chest Pain: Card Cath =  combined systolic and diastolic heart failure with estimated EF of 35-40%,/ and calcified but patent coronary arteries.  2. Accelerated HTN / with Volume Overload= CARD CATH showed high filing pressures/ 140/64 bp  with vol off and bp meds in hosp( ? compliance meds as OP). Amlodipine 5mg  hs /metoprolol 25 mg bid / lisinopril 10mg  added/ Hydralazine 25mg  tid / wt today 2 kg > edw / needs lower edw at dc /onitor bp with hd uf  With new bp meds    3 ESRD: MWF. HD today for extra tx for vol/ then in am normal mwf  4, Anemia: Hgb 9.2.. 10.1 Restarted 25 mcg Aranesp   5. 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 Nutrition: albumin 3.6/Carb mod renal failure / renal vit.  6. Tobacco/ Marijuana abuse;  Denies any Cocaine  Ernest Haber, PA-C [facility] [phone] 09/15/2014,11:43 AM  LOS: 3 days   Pt seen, examined, agree w assess/plan as above with additions as indicated. For HD today to get volume down further.  Per RN pt will be going home after HD today.  He may need dry wt reduction in OP setting, we will let HD unit know.  Kelly Splinter MD pager [phone]    cell [phone] 09/15/2014, 12:23 PM     Labs: Basic Metabolic Panel:  Recent Labs Lab 09/13/14 0317 09/13/14 1525 09/14/14 1029  NA 139 137 134*  K 3.5 3.6 3.8  CL 97 96 98  CO2 33* 30 26  GLUCOSE 107* 83 142*  BUN 27* 32* 34*  CREATININE 10.70* 11.72* 10.01*  CALCIUM 8.8 8.8 8.8  PHOS 5.2* 5.3*  --    Liver Function Tests:  Recent Labs Lab 09/13/14 0317 09/13/14 1525  ALBUMIN 3.1* 3.6    Recent Labs Lab 09/12/14 0301 09/13/14 0317 09/14/14 1029  WBC 5.4 6.1 6.9  NEUTROABS 2.4  --   --   HGB 8.9* 9.2* 10.1*  HCT 27.2* 27.4* 30.0*  MCV 81.0 81.1 80.6  PLT 157 188 220   Cardiac Enzymes:  Recent Labs Lab 09/12/14 1100 09/12/14 1625 09/12/14 2335 09/13/14 0724 09/13/14 1823  TROPONINI 0.15* 0.15* 0.13* 0.13* 0.11*   CBG:  Recent Labs Lab 09/14/14 1654 09/14/14 2042 09/15/14 0341 09/15/14 0402 09/15/14 0757  GLUCAP 89 114* 67* 99 132*  Studies/Results: No results found. Medications:   . amLODipine  5 mg Oral QHS  . aspirin  81 mg Oral Daily  . atorvastatin  80 mg Oral q1800  . calcitRIOL  1.25 mcg Oral Q M,W,F-HD  . calcium acetate  2,001 mg Oral TID WC  . Chlorhexidine Gluconate Cloth  6 each Topical Q0600  . darbepoetin (ARANESP) injection - DIALYSIS  25 mcg Intravenous Q Fri-HD  . heparin  5,000 Units Subcutaneous 3 times per day  . hydrALAZINE  25 mg Oral TID  . insulin aspart  0-9 Units Subcutaneous TID WC  . insulin glargine  13 Units Subcutaneous QHS  . lanthanum  1,000 mg Oral TID WC  . lisinopril  10 mg Oral Daily  . metoprolol tartrate  25 mg Oral BID  . multivitamin  1 tablet Oral QHS  . mupirocin ointment   Nasal BID  . pantoprazole  40 mg Oral Daily  . sodium chloride  3 mL Intravenous Q12H

## 2014-09-16 ENCOUNTER — Ambulatory Visit: Payer: Medicaid Other

## 2014-09-18 ENCOUNTER — Emergency Department (HOSPITAL_COMMUNITY): Payer: Medicaid Other

## 2014-09-18 ENCOUNTER — Encounter (HOSPITAL_COMMUNITY): Payer: Self-pay | Admitting: Adult Health

## 2014-09-18 ENCOUNTER — Emergency Department (HOSPITAL_COMMUNITY)
Admission: EM | Admit: 2014-09-18 | Discharge: 2014-09-18 | Disposition: A | Discharge status: Home / Self Care | Payer: Medicaid Other | Attending: Emergency Medicine | Admitting: Emergency Medicine

## 2014-09-18 DIAGNOSIS — Z720 Tobacco use: Secondary | ICD-10-CM | POA: Insufficient documentation

## 2014-09-18 DIAGNOSIS — Z992 Dependence on renal dialysis: Secondary | ICD-10-CM | POA: Diagnosis not present

## 2014-09-18 DIAGNOSIS — Z794 Long term (current) use of insulin: Secondary | ICD-10-CM | POA: Diagnosis not present

## 2014-09-18 DIAGNOSIS — E119 Type 2 diabetes mellitus without complications: Secondary | ICD-10-CM | POA: Diagnosis not present

## 2014-09-18 DIAGNOSIS — R0989 Other specified symptoms and signs involving the circulatory and respiratory systems: Secondary | ICD-10-CM | POA: Diagnosis not present

## 2014-09-18 DIAGNOSIS — Z7982 Long term (current) use of aspirin: Secondary | ICD-10-CM | POA: Diagnosis not present

## 2014-09-18 DIAGNOSIS — R0789 Other chest pain: Secondary | ICD-10-CM | POA: Diagnosis not present

## 2014-09-18 DIAGNOSIS — I10 Essential (primary) hypertension: Secondary | ICD-10-CM

## 2014-09-18 DIAGNOSIS — Z9889 Other specified postprocedural states: Secondary | ICD-10-CM | POA: Insufficient documentation

## 2014-09-18 DIAGNOSIS — N186 End stage renal disease: Secondary | ICD-10-CM | POA: Insufficient documentation

## 2014-09-18 DIAGNOSIS — R7989 Other specified abnormal findings of blood chemistry: Secondary | ICD-10-CM | POA: Diagnosis not present

## 2014-09-18 DIAGNOSIS — R778 Other specified abnormalities of plasma proteins: Secondary | ICD-10-CM

## 2014-09-18 DIAGNOSIS — Z79899 Other long term (current) drug therapy: Secondary | ICD-10-CM | POA: Diagnosis not present

## 2014-09-18 DIAGNOSIS — R079 Chest pain, unspecified: Secondary | ICD-10-CM | POA: Diagnosis present

## 2014-09-18 DIAGNOSIS — I120 Hypertensive chronic kidney disease with stage 5 chronic kidney disease or end stage renal disease: Secondary | ICD-10-CM | POA: Diagnosis not present

## 2014-09-18 LAB — CBC
HCT: 24 % — ABNORMAL LOW (ref 39.0–52.0)
HEMOGLOBIN: 8.2 g/dL — AB (ref 13.0–17.0)
MCH: 27.4 pg (ref 26.0–34.0)
MCHC: 34.2 g/dL (ref 30.0–36.0)
MCV: 80.3 fL (ref 78.0–100.0)
PLATELETS: 302 10*3/uL (ref 150–400)
RBC: 2.99 MIL/uL — ABNORMAL LOW (ref 4.22–5.81)
RDW: 14.6 % (ref 11.5–15.5)
WBC: 7 10*3/uL (ref 4.0–10.5)

## 2014-09-18 LAB — BASIC METABOLIC PANEL
Anion gap: 10 (ref 5–15)
BUN: 55 mg/dL — ABNORMAL HIGH (ref 6–23)
CHLORIDE: 102 mmol/L (ref 96–112)
CO2: 27 mmol/L (ref 19–32)
CREATININE: 13.76 mg/dL — AB (ref 0.50–1.35)
Calcium: 9 mg/dL (ref 8.4–10.5)
GFR calc non Af Amer: 4 mL/min — ABNORMAL LOW (ref >90)
GFR, EST AFRICAN AMERICAN: 5 mL/min — AB (ref >90)
Glucose, Bld: 157 mg/dL — ABNORMAL HIGH (ref 70–99)
Potassium: 4.4 mmol/L (ref 3.5–5.1)
Sodium: 139 mmol/L (ref 135–145)

## 2014-09-18 LAB — BRAIN NATRIURETIC PEPTIDE: B Natriuretic Peptide: 1681.3 pg/mL — ABNORMAL HIGH (ref 0.0–100.0)

## 2014-09-18 LAB — TROPONIN I: TROPONIN I: 0.08 ng/mL — AB (ref <0.031)

## 2014-09-18 NOTE — Discharge Instructions (Signed)
Your workup today has shown some fluid in your lungs, which may be the cause of your shortness of breath.  It is important for you to have dialysis today as scheduled.  Your troponin was slightly elevated, as it has been in the past.  You had a recent heart catheterization that did not show any blockages.  Her troponin is elevated, most likely due to your renal disease.  Follow-up with cardiology as scheduled later this month.  Return to the emergency department for worsening condition or new concerning symptoms.   Chest Pain Observation It is often hard to give a specific diagnosis for the cause of chest pain. Among other possibilities your symptoms might be caused by inadequate oxygen delivery to your heart (angina). Angina that is not treated or evaluated can lead to a heart attack (myocardial infarction) or death. Blood tests, electrocardiograms, and X-rays may have been done to help determine a possible cause of your chest pain. After evaluation and observation, your health care provider has determined that it is unlikely your pain was caused by an unstable condition that requires hospitalization. However, a full evaluation of your pain may need to be completed, with additional diagnostic testing as directed. It is very important to keep your follow-up appointments. Not keeping your follow-up appointments could result in permanent heart damage, disability, or death. If there is any problem keeping your follow-up appointments, you must call your health care provider. HOME CARE INSTRUCTIONS  Due to the slight chance that your pain could be angina, it is important to follow your health care provider's treatment plan and also maintain a healthy lifestyle:  Maintain or work toward achieving a healthy weight.  Stay physically active and exercise regularly.  Decrease your salt intake.  Eat a balanced, healthy diet. Talk to a dietitian to learn about heart-healthy foods.  Increase your fiber intake by  including whole grains, vegetables, fruits, and nuts in your diet.  Avoid situations that cause stress, anger, or depression.  Take medicines as advised by your health care provider. Report any side effects to your health care provider. Do not stop medicines or adjust the dosages on your own.  Quit smoking. Do not use nicotine patches or gum until you check with your health care provider.  Keep your blood pressure, blood sugar, and cholesterol levels within normal limits.  Limit alcohol intake to no more than 1 drink per day for women who are not pregnant and 2 drinks per day for men.  Do not abuse drugs. SEEK IMMEDIATE MEDICAL CARE IF: You have severe chest pain or pressure which may include symptoms such as:  You feel pain or pressure in your arms, neck, jaw, or back.  You have severe back or abdominal pain, feel sick to your stomach (nauseous), or throw up (vomit).  You are sweating profusely.  You are having a fast or irregular heartbeat.  You feel short of breath while at rest.  You notice increasing shortness of breath during rest, sleep, or with activity.  You have chest pain that does not get better after rest or after taking your usual medicine.  You wake from sleep with chest pain.  You are unable to sleep because you cannot breathe.  You develop a frequent cough or you are coughing up blood.  You feel dizzy, faint, or experience extreme fatigue.  You develop severe weakness, dizziness, fainting, or chills. Any of these symptoms may represent a serious problem that is an emergency. Do not wait to see  if the symptoms will go away. Call your local emergency services (911 in the [place] Do not drive yourself to the hospital. MAKE SURE YOU:  Understand these instructions.  Will watch your condition.  Will get help right away if you are not doing well or get worse. Document Released: 09/04/2010 Document Revised: 08/07/2013 Document Reviewed: 02/01/2013 [facility]  Patient Information 2015 [place], [place]. This information is not intended to replace advice given to you by your health care provider. Make sure you discuss any questions you have with your health care provider.

## 2014-09-18 NOTE — ED Provider Notes (Signed)
CSN: [id number]     Arrival date & time 09/18/14  0209 History  This chart was scribed for Kalman Drape, MD by Delphia Grates, ED Scribe. This patient was seen in room [facility] and the patient's care was started at 2:19 AM.   Chief Complaint  Patient presents with  . Chest Pain    The history is provided by the patient. No language interpreter was used.     HPI Comments: [name] Vilorio Sr. is a 39 y.o. male, with history of DM, renal disorder, and HTN, brought in by ambulance, who presents to the Emergency Department complaining of sudden onset chest pain with that began approximately 1 hour ago. Patient was seen here and admitted 5 days ago for the samewhere a catheterization was done. There is associated SOB and productive cough. He reports his chest pain has improved, but he is still experiencing SOB. Patient receives dialysis every Monday, Wednesday, and Friday, with the last treatment 2 days ago.    Past Medical History  Diagnosis Date  . Diabetes mellitus   . Renal disorder dialysis    MWF [facility]  . Hypertension    Past Surgical History  Procedure Laterality Date  . Av fistula placement  left arm  . Amputation Left 09/27/2013    Procedure: LEFT GREAT TOE AMPUTATION;  Surgeon: [name], MD;  Location: [place];  Service: Orthopedics;  Laterality: Left;  . Left heart catheterization with coronary angiogram N/A 09/13/2014    Procedure: LEFT HEART CATHETERIZATION WITH CORONARY ANGIOGRAM;  Surgeon: Sinclair Grooms, MD;  Location: [facility] CATH LAB;  Service: Cardiovascular;  Laterality: N/A;   History reviewed. No pertinent family history. History  Substance Use Topics  . Smoking status: Current Every Day Smoker -- 0.25 packs/day for 20 years    Types: Cigarettes  . Smokeless tobacco: Never Used  . Alcohol Use: Yes    Review of Systems  Constitutional: Negative for fever.  Respiratory: Positive for cough and shortness of breath.   Cardiovascular: Positive for  chest pain.      Allergies  Coconut oil  Home Medications   Prior to Admission medications   Medication Sig Start Date End Date Taking? Authorizing Provider  amLODipine (NORVASC) 5 MG tablet Take 1 tablet (5 mg total) by mouth at bedtime. 08/08/13   Velvet Bathe, MD  aspirin 81 MG chewable tablet Chew 1 tablet (81 mg total) by mouth daily. 09/15/14   Francesca Oman, DO  atorvastatin (LIPITOR) 80 MG tablet Take 1 tablet (80 mg total) by mouth daily at 6 PM. 09/15/14   Francesca Oman, DO  calcium acetate (PHOSLO) 667 MG capsule Take 3 capsules (2,001 mg total) by mouth 3 (three) times daily with meals. 08/08/13   Velvet Bathe, MD  hydrALAZINE (APRESOLINE) 25 MG tablet Take 1 tablet (25 mg total) by mouth 3 (three) times daily. 09/15/14   Francesca Oman, DO  insulin glargine (LANTUS) 100 UNIT/ML injection Inject 0.1 mLs (10 Units total) into the skin at bedtime. 09/15/14   Francesca Oman, DO  lanthanum (FOSRENOL) 1000 MG chewable tablet Chew 1 tablet (1,000 mg total) by mouth 3 (three) times daily with meals. 08/08/13   Velvet Bathe, MD  lisinopril (PRINIVIL,ZESTRIL) 10 MG tablet Take 1 tablet (10 mg total) by mouth daily. 09/15/14   Francesca Oman, DO  methocarbamol (ROBAXIN) 500 MG tablet Take 1 tablet (500 mg total) by mouth 2 (two) times daily as needed for muscle spasms.  Patient not taking: Reported on 09/12/2014 05/06/14   Elmyra Ricks Pisciotta, PA-C  metoprolol tartrate (LOPRESSOR) 25 MG tablet Take 1 tablet (25 mg total) by mouth 2 (two) times daily. 09/15/14   Francesca Oman, DO  pantoprazole (PROTONIX) 40 MG tablet Take 1 tablet (40 mg total) by mouth daily. 09/15/14   Francesca Oman, DO   Triage Vitals: BP 174/86 mmHg  Pulse 89  Temp(Src) 98.3 F (36.8 C) (Oral)  Resp 18  SpO2 94%  Physical Exam  Constitutional: He is oriented to person, place, and time. He appears well-developed and well-nourished. No distress.  HENT:  Head: Normocephalic and atraumatic.  Nose: Nose normal.  Mouth/Throat:  Oropharynx is clear and moist.  Eyes: Conjunctivae and EOM are normal.  Neck: Neck supple. No JVD present. No tracheal deviation present. No thyromegaly present.  Cardiovascular: Normal rate, regular rhythm, normal heart sounds and intact distal pulses.  Exam reveals no gallop and no friction rub.   No murmur heard. Pulmonary/Chest: Effort normal. No stridor. No respiratory distress. He has rales (bases bilaterally, worse in right).  Crackles in the right lower lung.  Abdominal: Soft. Bowel sounds are normal. There is no tenderness.  Musculoskeletal: Normal range of motion. He exhibits no edema or tenderness.  Lymphadenopathy:    He has no cervical adenopathy.  Neurological: He is alert and oriented to person, place, and time. He exhibits normal muscle tone. Coordination normal.  Skin: Skin is warm and dry. No rash noted. No erythema. No pallor.  Psychiatric: He has a normal mood and affect. His behavior is normal. Judgment and thought content normal.  Nursing note and vitals reviewed.   ED Course  Procedures (including critical care time)  DIAGNOSTIC STUDIES: Oxygen Saturation is 94% on room air, adequate by my interpretation.    COORDINATION OF CARE: At [place] Discussed treatment plan with patient which includes imaging and labs. Patient agrees.    Labs Review Labs Reviewed  CBC - Abnormal; Notable for the following:    RBC 2.99 (*)    Hemoglobin 8.2 (*)    HCT 24.0 (*)    All other components within normal limits  BASIC METABOLIC PANEL - Abnormal; Notable for the following:    Glucose, Bld 157 (*)    BUN 55 (*)    Creatinine, Ser 13.76 (*)    GFR calc non Af Amer 4 (*)    GFR calc Af Amer 5 (*)    All other components within normal limits  TROPONIN I - Abnormal; Notable for the following:    Troponin I 0.08 (*)    All other components within normal limits  BRAIN NATRIURETIC PEPTIDE - Abnormal; Notable for the following:    B Natriuretic Peptide 1681.3 (*)    All other  components within normal limits    Imaging Review Dg Chest Port 1 View  09/18/2014   CLINICAL DATA:  Acute onset of left-sided chest pain. Initial encounter.  EXAM: PORTABLE CHEST - 1 VIEW  COMPARISON:  Chest radiograph performed 09/12/2014  FINDINGS: The lungs are well-aerated. Vascular congestion is noted, with bilateral central airspace opacities. This is concerning for mildly worsening pulmonary edema, though pneumonia might have a similar appearance. A small left pleural effusion is suggested. No definite pneumothorax is seen.  The cardiomediastinal silhouette is borderline enlarged. No acute osseous abnormalities are identified.  IMPRESSION: Vascular congestion and borderline cardiomegaly, with bilateral central airspace opacities. This is concerning for mildly worsening pulmonary edema, though pneumonia might have a similar  appearance. Small left pleural effusion suggested.   Electronically Signed   By: [name] M.D.   On: 09/18/2014 02:33     EKG Interpretation   Date/Time:  Wednesday September 18 2014 02:12:36 EST Ventricular Rate:  82 PR Interval:  156 QRS Duration: 90 QT Interval:  436 QTC Calculation: 509 R Axis:   76 Text Interpretation:  Sinus rhythm Abnormal T, consider ischemia, lateral  leads Prolonged QT interval No significant change since last tracing  Confirmed by Julaine Zimny  MD, Reagen Haberman ([id number]) on 09/18/2014 2:16:46 AM      MDM   Final diagnoses:  Other chest pain  Elevated troponin  Essential hypertension  ESRD on dialysis   39 year old male with chest pain and shortness of breath, recent admission for same, heart catheterization done without disease requiring stents.  Patient has history of end-stage renal disease, dialysis Monday, Wednesday, Friday, due for dialysis today.  Normal run on Monday.  Chest pain is better after nitroglycerin and albuterol treatment from EMS.  Rales heard in bases.  Plan for chest x-ray, lab work.  EKG unchanged from prior  I personally  performed the services described in this documentation, which was scribed in my presence. The recorded information has been reviewed and is accurate.  Patient with mildly elevated troponin, decreasing from prior.  Heart catheterization done recently.  Pulmonary edema noted, possible pneumonia.  The patient has not had fever.  He is due for dialysis today.  Patient is stable for discharge home.  His follow-up with cardiology planned for later this month.  Updated on findings and plan.    Kalman Drape, MD 09/18/14 [phone]

## 2014-09-18 NOTE — ED Notes (Signed)
Presents with chest pain that woke him from sleep about one hour ago, associated with SOB. Dialysis pt with days MWF. EMS noted minimal elevation in V1, V2 and V3 and bilateral wheezes and rhonchi. Given 5 of albuterol and 2 SL nitro with total relief of CHest pain as well as 324 of ASA. PT d/c from [facility] post cardiac event Sunday.

## 2014-09-18 NOTE — ED Notes (Signed)
Pt A&OX4, ambulatory at d/ with steady gait, NAD, no further questions/concerns.

## 2014-10-04 ENCOUNTER — Encounter: Payer: Self-pay | Admitting: Cardiovascular Disease

## 2014-10-04 ENCOUNTER — Ambulatory Visit (INDEPENDENT_AMBULATORY_CARE_PROVIDER_SITE_OTHER): Payer: Medicaid Other | Admitting: Cardiovascular Disease

## 2014-10-04 VITALS — BP 132/78 | HR 71 | Ht 74.0 in | Wt 184.8 lb

## 2014-10-04 DIAGNOSIS — N189 Chronic kidney disease, unspecified: Secondary | ICD-10-CM

## 2014-10-04 DIAGNOSIS — R072 Precordial pain: Secondary | ICD-10-CM

## 2014-10-04 DIAGNOSIS — E1022 Type 1 diabetes mellitus with diabetic chronic kidney disease: Secondary | ICD-10-CM

## 2014-10-04 DIAGNOSIS — I10 Essential (primary) hypertension: Secondary | ICD-10-CM

## 2014-10-04 DIAGNOSIS — N186 End stage renal disease: Secondary | ICD-10-CM

## 2014-10-04 DIAGNOSIS — Z992 Dependence on renal dialysis: Secondary | ICD-10-CM

## 2014-10-04 DIAGNOSIS — R011 Cardiac murmur, unspecified: Secondary | ICD-10-CM

## 2014-10-04 NOTE — Assessment & Plan Note (Signed)
Flow murmur through AV from fistula see above echo report

## 2014-10-04 NOTE — Assessment & Plan Note (Signed)
Well controlled.  Continue current medications and low sodium Dash type diet.    

## 2014-10-04 NOTE — Progress Notes (Signed)
Patient ID: [name] Stahlberg Sr., male   DOB: [birth year], 39 y.o.   MRN: [id number] 39 y.o. recently hospitalized for prolonged chest pain and CHF.  Seen by  Dr Gwenlyn Found and Dr Tamala Julian.  Cath with no CAD but elevated filling pressures  IMPRESSIONS: 1. 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 Etiology is uncertain but likely hypertension related. 2. Heavily calcified but widely patent coronary arteries. The mid to distal LAD contains an eccentric 50-60%, and the continuation of the circumflex beyond the second marginal contains 90% stenosis. The circumflex beyond the second marginal is very small in distribution and is not clinically relevant. 3. Prolonged chest pain of uncertain etiology 4. Elevated troponin related to end-stage renal disease and cardiomyopathy.  Echo 1/31 with moderate LVH  Study Conclusions  - Left ventricle: The cavity size was normal. Wall thickness was increased in a pattern of moderate LVH. Systolic function was normal. The estimated ejection fraction was in the range of 50% to 55%. Wall motion was normal; there were no regional wall motion abnormalities. Features are consistent with a pseudonormal left ventricular filling pattern, with concomitant abnormal relaxation and increased filling pressure (grade 2 diastolic dysfunction). - Aortic valve: Valve area (VTI): 3.53 cm^2. Valve area (Vmax): 3.37 cm^2. Valve area (Vmean): 3.2 cm^2. - Left atrium: The atrium was moderately to severely dilated. - Atrial septum: The septum bowed from left to right, consistent with increased left atrial pressure.  Patient has HTN, DM and CRF.  Cr is 13.7 on dialysis  Since d/c only fatigue and back pain  Plans for renal to run his dry weight a couple of kilos lower       ROS: Denies fever, malais, weight loss, blurry vision, decreased visual  acuity, cough, sputum, SOB, hemoptysis, pleuritic pain, palpitaitons, heartburn, abdominal pain, melena, lower extremity edema, claudication, or rash.  All other systems reviewed and negative  General: Affect appropriate Healthy:  appears stated age 9: normal Neck supple with no adenopathy JVP normal no bruits no thyromegaly Lungs clear with no wheezing and good diaphragmatic motion Heart:  S1/S2SEM murmur, no rub, gallop or click PMI normal Abdomen: benighn, BS positve, no tenderness, no AAA no bruit.  No HSM or HJR Distal pulses intact with no bruits No edema Neuro non-focal Skin warm and dry No muscular weakness   Current Outpatient Prescriptions  Medication Sig Dispense Refill  . amLODipine (NORVASC) 5 MG tablet Take 1 tablet (5 mg total) by mouth at bedtime. 30 tablet 0  . aspirin 81 MG chewable tablet Chew 1 tablet (81 mg total) by mouth daily. 30 tablet 0  . atorvastatin (LIPITOR) 80 MG tablet Take 1 tablet (80 mg total) by mouth daily at 6 PM. 30 tablet 0  . calcium acetate (PHOSLO) 667 MG capsule Take 3 capsules (2,001 mg total) by mouth 3 (three) times daily with meals. 30 capsule 0  . hydrALAZINE (APRESOLINE) 25 MG tablet Take 1 tablet (25 mg total) by mouth 3 (three) times daily. 90 tablet 0  . insulin glargine (LANTUS) 100 UNIT/ML injection Inject 0.1 mLs (10 Units total) into the skin at bedtime. 10 mL 12  . lanthanum (FOSRENOL) 1000 MG chewable tablet Chew 1 tablet (1,000 mg total) by mouth 3 (three) times daily with meals. 30 tablet 0  . lisinopril (PRINIVIL,ZESTRIL) 10 MG tablet Take 1 tablet (10 mg total) by mouth daily. 30 tablet 0  . methocarbamol (  ROBAXIN) 500 MG tablet Take 1 tablet (500 mg total) by mouth 2 (two) times daily as needed for muscle spasms. (Patient not taking: Reported on 09/12/2014) 13 tablet 0  . metoprolol tartrate (LOPRESSOR) 25 MG tablet Take 1 tablet (25 mg total) by mouth 2 (two) times daily. 60 tablet 0  . pantoprazole (PROTONIX) 40 MG  tablet Take 1 tablet (40 mg total) by mouth daily. 30 tablet 0   No current facility-administered medications for this visit.    Allergies  Coconut oil  Electrocardiogram:  SR rate 82  LVH    Assessment and Plan

## 2014-10-04 NOTE — Assessment & Plan Note (Signed)
Recent cath with no epicardial CAD  Continue good BP control and increased dialysis to lower diastolic trans coronary flow gradients

## 2014-10-04 NOTE — Assessment & Plan Note (Signed)
Juvenile diabetic since age 39  Discussed low carb diet.  Target hemoglobin A1c is 6.5 or less.  Continue current medications.

## 2014-10-04 NOTE — Patient Instructions (Addendum)
Your physician recommends that you schedule a follow-up appointment in: [place]   Your physician recommends that you continue on your current medications as directed. Please refer to the Current Medication list given to you today.

## 2014-10-04 NOTE — Assessment & Plan Note (Signed)
F/U Dr Dederding.  Fistual LUE normal thrill.  Run dry weight lower ? 78 kg due to high LV filling pressures seen on cath by Dr Tamala Julian

## 2015-04-24 ENCOUNTER — Encounter (HOSPITAL_COMMUNITY): Payer: Self-pay | Admitting: Emergency Medicine

## 2015-04-24 ENCOUNTER — Emergency Department (HOSPITAL_COMMUNITY)
Admission: EM | Admit: 2015-04-24 | Discharge: 2015-04-24 | Disposition: A | Discharge status: Home / Self Care | Payer: Medicaid Other | Attending: Emergency Medicine | Admitting: Emergency Medicine

## 2015-04-24 ENCOUNTER — Emergency Department (HOSPITAL_COMMUNITY): Payer: Medicaid Other

## 2015-04-24 DIAGNOSIS — Z87891 Personal history of nicotine dependence: Secondary | ICD-10-CM | POA: Diagnosis not present

## 2015-04-24 DIAGNOSIS — E119 Type 2 diabetes mellitus without complications: Secondary | ICD-10-CM | POA: Insufficient documentation

## 2015-04-24 DIAGNOSIS — Z87448 Personal history of other diseases of urinary system: Secondary | ICD-10-CM | POA: Insufficient documentation

## 2015-04-24 DIAGNOSIS — Z79899 Other long term (current) drug therapy: Secondary | ICD-10-CM | POA: Insufficient documentation

## 2015-04-24 DIAGNOSIS — Z794 Long term (current) use of insulin: Secondary | ICD-10-CM | POA: Insufficient documentation

## 2015-04-24 DIAGNOSIS — R112 Nausea with vomiting, unspecified: Secondary | ICD-10-CM | POA: Insufficient documentation

## 2015-04-24 DIAGNOSIS — I10 Essential (primary) hypertension: Secondary | ICD-10-CM | POA: Diagnosis not present

## 2015-04-24 DIAGNOSIS — R109 Unspecified abdominal pain: Secondary | ICD-10-CM | POA: Insufficient documentation

## 2015-04-24 DIAGNOSIS — Z7982 Long term (current) use of aspirin: Secondary | ICD-10-CM | POA: Diagnosis not present

## 2015-04-24 LAB — CBC WITH DIFFERENTIAL/PLATELET
Basophils Absolute: 0 10*3/uL (ref 0.0–0.1)
Basophils Relative: 1 % (ref 0–1)
EOS ABS: 0.2 10*3/uL (ref 0.0–0.7)
EOS PCT: 3 % (ref 0–5)
HCT: 43.1 % (ref 39.0–52.0)
HEMOGLOBIN: 14.1 g/dL (ref 13.0–17.0)
LYMPHS PCT: 32 % (ref 12–46)
Lymphs Abs: 2 10*3/uL (ref 0.7–4.0)
MCH: 28.4 pg (ref 26.0–34.0)
MCHC: 32.7 g/dL (ref 30.0–36.0)
MCV: 86.9 fL (ref 78.0–100.0)
Monocytes Absolute: 0.6 10*3/uL (ref 0.1–1.0)
Monocytes Relative: 9 % (ref 3–12)
NEUTROS PCT: 55 % (ref 43–77)
Neutro Abs: 3.5 10*3/uL (ref 1.7–7.7)
PLATELETS: 300 10*3/uL (ref 150–400)
RBC: 4.96 MIL/uL (ref 4.22–5.81)
RDW: 15.1 % (ref 11.5–15.5)
WBC: 6.3 10*3/uL (ref 4.0–10.5)

## 2015-04-24 LAB — COMPREHENSIVE METABOLIC PANEL
ALT: 22 U/L (ref 17–63)
AST: 20 U/L (ref 15–41)
Albumin: 3.7 g/dL (ref 3.5–5.0)
Alkaline Phosphatase: 60 U/L (ref 38–126)
Anion gap: 16 — ABNORMAL HIGH (ref 5–15)
BUN: 33 mg/dL — ABNORMAL HIGH (ref 6–20)
CHLORIDE: 94 mmol/L — AB (ref 101–111)
CO2: 26 mmol/L (ref 22–32)
Calcium: 8.9 mg/dL (ref 8.9–10.3)
Creatinine, Ser: 12.21 mg/dL — ABNORMAL HIGH (ref 0.61–1.24)
GFR calc Af Amer: 5 mL/min — ABNORMAL LOW (ref >60)
GFR calc non Af Amer: 5 mL/min — ABNORMAL LOW (ref >60)
Glucose, Bld: 158 mg/dL — ABNORMAL HIGH (ref 65–99)
Potassium: 4.3 mmol/L (ref 3.5–5.1)
Sodium: 136 mmol/L (ref 135–145)
Total Bilirubin: 0.9 mg/dL (ref 0.3–1.2)
Total Protein: 7.7 g/dL (ref 6.5–8.1)

## 2015-04-24 LAB — LIPASE, BLOOD: Lipase: 74 U/L — ABNORMAL HIGH (ref 22–51)

## 2015-04-24 MED ORDER — TRAMADOL HCL 50 MG PO TABS: 50.0000 mg | ORAL_TABLET | Freq: Four times a day (QID) | ORAL | Status: DC | PRN

## 2015-04-24 MED ORDER — DICYCLOMINE HCL 20 MG PO TABS: 20.0000 mg | ORAL_TABLET | Freq: Two times a day (BID) | ORAL | Status: DC

## 2015-04-24 MED ORDER — ONDANSETRON HCL 4 MG/2ML IJ SOLN
4.0000 mg | Freq: Once | INTRAMUSCULAR | Status: AC
  Administered 2015-04-24: 4 mg via INTRAVENOUS
  Filled 2015-04-24: qty 2

## 2015-04-24 MED ORDER — MORPHINE SULFATE (PF) 4 MG/ML IV SOLN
4.0000 mg | Freq: Once | INTRAVENOUS | Status: AC
  Administered 2015-04-24: 4 mg via INTRAVENOUS
  Filled 2015-04-24: qty 1

## 2015-04-24 MED ORDER — RANITIDINE HCL 150 MG PO TABS
150.0000 mg | ORAL_TABLET | Freq: Two times a day (BID) | ORAL | Status: DC
Start: 2015-04-24 — End: 2015-08-15

## 2015-04-24 MED ORDER — IOHEXOL 300 MG/ML  SOLN
100.0000 mL | Freq: Once | INTRAMUSCULAR | Status: AC | PRN
  Administered 2015-04-24: 100 mL via INTRAVENOUS

## 2015-04-24 NOTE — ED Notes (Signed)
Pt ambulatory with steady gait on departure from ED. Verbalizes understanding of discharge instructions. NAD. Denies pain. A/o x4. Vss.

## 2015-04-24 NOTE — ED Notes (Signed)
Pt transported to CT ?

## 2015-04-24 NOTE — Discharge Instructions (Signed)

## 2015-04-24 NOTE — ED Notes (Signed)
patient brought to room; patient getting undressed and into a gown at this time; Erlene Quan, EMT aware

## 2015-04-24 NOTE — ED Notes (Signed)
Pt to ER for evaluation of abdominal pain, worse to right upper/lower quadrants. Pt states he has not been able to keep food/water down due to vomiting. Pt is tender on palpation to RLQ. Grimacing. Pt is a/o x4. States he still has his appendix and gallbladder. Pt is dialysis pt with fistula to left arm. VSS.

## 2015-04-24 NOTE — ED Provider Notes (Signed)
CSN: [id number]     Arrival date & time 04/24/15  0747 History   First MD Initiated Contact with Patient 04/24/15 [phone]     Chief Complaint  Patient presents with  . Abdominal Pain     (Consider location/radiation/quality/duration/timing/severity/associated sxs/prior Treatment) HPI Comments: Patient presents to the ER for evaluation of abdominal pain. Patient reports that he has been experiencing cramping abdominal pain for the last 2 or 3 days. He reports that he was on his way to work this morning when his pain worsens. He is complaining of severe and sharp cramping pain around the umbilicus and he had nausea and vomiting this morning. He reports that the vomit was clear, no hematemesis. He has not had any diarrhea, has felt constipated. He has not had any fever. No chest pain or shortness of breath. Patient is a dialysis patient, receives dialysis Monday Wednesday Friday. He had normal dialysis yesterday.  Patient is a 39 y.o. male presenting with abdominal pain.  Abdominal Pain Associated symptoms: constipation, nausea and vomiting     Past Medical History  Diagnosis Date  . Diabetes mellitus   . Renal disorder dialysis    MWF [facility]  . Hypertension    Past Surgical History  Procedure Laterality Date  . Av fistula placement  left arm  . Amputation Left 09/27/2013    Procedure: LEFT GREAT TOE AMPUTATION;  Surgeon: [name], MD;  Location: [place];  Service: Orthopedics;  Laterality: Left;  . Left heart catheterization with coronary angiogram N/A 09/13/2014    Procedure: LEFT HEART CATHETERIZATION WITH CORONARY ANGIOGRAM;  Surgeon: Sinclair Grooms, MD;  Location: [facility] CATH LAB;  Service: Cardiovascular;  Laterality: N/A;   Family History  Problem Relation Age of Onset  . Heart failure Mother   . Hypertension Mother    Social History  Substance Use Topics  . Smoking status: Former Smoker -- 0.25 packs/day for 20 years    Types: Cigarettes    Quit date: 09/27/2014  .  Smokeless tobacco: Never Used  . Alcohol Use: Yes    Review of Systems  Gastrointestinal: Positive for nausea, vomiting, abdominal pain and constipation.  All other systems reviewed and are negative.     Allergies  Coconut oil  Home Medications   Prior to Admission medications   Medication Sig Start Date End Date Taking? Authorizing Provider  acetaminophen (TYLENOL) 500 MG tablet Take 1,000 mg by mouth every 6 (six) hours as needed for mild pain.   Yes Historical Provider, MD  amLODipine (NORVASC) 5 MG tablet Take 1 tablet (5 mg total) by mouth at bedtime. 08/08/13  Yes Velvet Bathe, MD  aspirin-acetaminophen-caffeine (EXCEDRIN MIGRAINE) [id number] MG per tablet Take 2 tablets by mouth every 6 (six) hours as needed for headache.   Yes Historical Provider, MD  atorvastatin (LIPITOR) 80 MG tablet Take 1 tablet (80 mg total) by mouth daily at 6 PM. 09/15/14  Yes Francesca Oman, DO  calcium acetate (PHOSLO) 667 MG capsule Take 3 capsules (2,001 mg total) by mouth 3 (three) times daily with meals. Patient taking differently: Take 667 mg by mouth 3 (three) times daily with meals.  08/08/13  Yes Velvet Bathe, MD  calcium acetate (PHOSLO) 667 MG capsule Take 667 mg by mouth 3 (three) times daily with meals. Takes with Fosrenol   Yes Historical Provider, MD  insulin glargine (LANTUS) 100 UNIT/ML injection Inject 0.1 mLs (10 Units total) into the skin at bedtime. Patient taking differently: Inject 48 Units  into the skin at bedtime.  09/15/14  Yes [name], DO  lanthanum (FOSRENOL) 1000 MG chewable tablet Chew 1 tablet (1,000 mg total) by mouth 3 (three) times daily with meals. Patient taking differently: Chew 2,000 mg by mouth 3 (three) times daily with meals.  08/08/13  Yes Velvet Bathe, MD  lanthanum (FOSRENOL) 1000 MG chewable tablet Chew 2,000 mg by mouth 3 (three) times daily with meals.   Yes Historical Provider, MD  aspirin 81 MG chewable tablet Chew 1 tablet (81 mg total) by mouth  daily. Patient not taking: Reported on 04/24/2015 09/15/14   Francesca Oman, DO  hydrALAZINE (APRESOLINE) 25 MG tablet Take 1 tablet (25 mg total) by mouth 3 (three) times daily. 09/15/14   Francesca Oman, DO  lisinopril (PRINIVIL,ZESTRIL) 10 MG tablet Take 1 tablet (10 mg total) by mouth daily. Patient not taking: Reported on 04/24/2015 09/15/14   Francesca Oman, DO  metoprolol tartrate (LOPRESSOR) 25 MG tablet Take 1 tablet (25 mg total) by mouth 2 (two) times daily. Patient not taking: Reported on 04/24/2015 09/15/14   Francesca Oman, DO  pantoprazole (PROTONIX) 40 MG tablet Take 1 tablet (40 mg total) by mouth daily. Patient not taking: Reported on 04/24/2015 09/15/14   Francesca Oman, DO   BP 141/67 mmHg  Pulse 66  Temp(Src) 98.3 F (36.8 C) (Oral)  Resp 17  SpO2 99% Physical Exam  Constitutional: He is oriented to person, place, and time. He appears well-developed and well-nourished. No distress.  HENT:  Head: Normocephalic and atraumatic.  Right Ear: Hearing normal.  Left Ear: Hearing normal.  Nose: Nose normal.  Mouth/Throat: Oropharynx is clear and moist and mucous membranes are normal.  Eyes: Conjunctivae and EOM are normal. Pupils are equal, round, and reactive to light.  Neck: Normal range of motion. Neck supple.  Cardiovascular: Regular rhythm, S1 normal and S2 normal.  Exam reveals no gallop and no friction rub.   No murmur heard. Pulmonary/Chest: Effort normal and breath sounds normal. No respiratory distress. He exhibits no tenderness.  Abdominal: Soft. Normal appearance and bowel sounds are normal. There is no hepatosplenomegaly. There is tenderness in the periumbilical area. There is no rebound, no guarding, no tenderness at McBurney's point and negative Murphy's sign. No hernia.  Musculoskeletal: Normal range of motion.  Neurological: He is alert and oriented to person, place, and time. He has normal strength. No cranial nerve deficit or sensory deficit. Coordination normal. GCS eye  subscore is 4. GCS verbal subscore is 5. GCS motor subscore is 6.  Skin: Skin is warm, dry and intact. No rash noted. No cyanosis.  Psychiatric: He has a normal mood and affect. His speech is normal and behavior is normal. Thought content normal.  Nursing note and vitals reviewed.   ED Course  Procedures (including critical care time) Labs Review Labs Reviewed  COMPREHENSIVE METABOLIC PANEL - Abnormal; Notable for the following:    Chloride 94 (*)    Glucose, Bld 158 (*)    BUN 33 (*)    Creatinine, Ser 12.21 (*)    GFR calc non Af Amer 5 (*)    GFR calc Af Amer 5 (*)    Anion gap 16 (*)    All other components within normal limits  LIPASE, BLOOD - Abnormal; Notable for the following:    Lipase 74 (*)    All other components within normal limits  CBC WITH DIFFERENTIAL/PLATELET    Imaging Review Dg Abd Acute W/chest  04/24/2015   CLINICAL DATA:  39 year old male with abdominal pain nausea and vomiting for 3 days. Initial encounter.  EXAM: DG ABDOMEN ACUTE W/ 1V CHEST  COMPARISON:  Chest radiographs 09/18/2014 and earlier. CT Abdomen and Pelvis 01/29/2010  FINDINGS: Stable lung volumes. Mild increased interstitial markings in both lungs are stable. No pneumothorax or pneumoperitoneum. Normal cardiac size and mediastinal contours. Visualized tracheal air column is within normal limits. No acute pulmonary opacity.  Non obstructed bowel gas pattern. Abdominal visceral contours are within normal limits. Chronic pelvic arterial and vas deferens calcifications. Pelvic visceral contours are otherwise are within normal limits. Calcified atherosclerosis continues into the proximal femoral arteries. No acute osseous abnormality identified.  IMPRESSION: 1.  Normal bowel gas pattern, no free air. 2.  No acute cardiopulmonary abnormality. 3. Age advanced calcified atherosclerosis in the pelvis and proximal lower extremities.   Electronically Signed   By: Genevie Ann M.D.   On: 04/24/2015 10:28   I have  personally reviewed and evaluated these images and lab results as part of my medical decision-making.   EKG Interpretation None      MDM   Final diagnoses:  None   abdominal pain  Patient presents to the ER for evaluation of abdominal pain. Patient is complaining of pain that is located around the umbilicus. Examination did reveal predominant right-sided tenderness. He did not, however, have signs of peritonitis. Patient's lab work was unremarkable. He was medicated with morphine and his pain significantly improved. Repeat examination after morphine, however, continued to show right-sided tenderness. CT scan was therefore performed to further evaluate. No Rebound was noted.  Patient is not complaining of any genitourinary complaints, but he tells me his cough was just diagnosed with PID. Will empirically cover.  Orpah Greek, MD 04/24/15 1438

## 2015-06-10 ENCOUNTER — Emergency Department (HOSPITAL_COMMUNITY): Payer: Medicaid Other

## 2015-06-10 ENCOUNTER — Encounter (HOSPITAL_COMMUNITY): Payer: Self-pay | Admitting: Emergency Medicine

## 2015-06-10 ENCOUNTER — Emergency Department (HOSPITAL_COMMUNITY)
Admission: EM | Admit: 2015-06-10 | Discharge: 2015-06-10 | Discharge status: Against Medical Advice | Payer: Medicaid Other | Attending: Emergency Medicine | Admitting: Emergency Medicine

## 2015-06-10 DIAGNOSIS — Z992 Dependence on renal dialysis: Secondary | ICD-10-CM | POA: Insufficient documentation

## 2015-06-10 DIAGNOSIS — Z87891 Personal history of nicotine dependence: Secondary | ICD-10-CM | POA: Insufficient documentation

## 2015-06-10 DIAGNOSIS — Z794 Long term (current) use of insulin: Secondary | ICD-10-CM | POA: Insufficient documentation

## 2015-06-10 DIAGNOSIS — I120 Hypertensive chronic kidney disease with stage 5 chronic kidney disease or end stage renal disease: Secondary | ICD-10-CM | POA: Insufficient documentation

## 2015-06-10 DIAGNOSIS — Z79899 Other long term (current) drug therapy: Secondary | ICD-10-CM | POA: Diagnosis not present

## 2015-06-10 DIAGNOSIS — L97519 Non-pressure chronic ulcer of other part of right foot with unspecified severity: Secondary | ICD-10-CM | POA: Diagnosis not present

## 2015-06-10 DIAGNOSIS — R0789 Other chest pain: Secondary | ICD-10-CM | POA: Diagnosis not present

## 2015-06-10 DIAGNOSIS — E119 Type 2 diabetes mellitus without complications: Secondary | ICD-10-CM | POA: Diagnosis not present

## 2015-06-10 DIAGNOSIS — Z7982 Long term (current) use of aspirin: Secondary | ICD-10-CM | POA: Insufficient documentation

## 2015-06-10 DIAGNOSIS — N186 End stage renal disease: Secondary | ICD-10-CM | POA: Diagnosis not present

## 2015-06-10 DIAGNOSIS — R079 Chest pain, unspecified: Secondary | ICD-10-CM | POA: Diagnosis present

## 2015-06-10 LAB — BASIC METABOLIC PANEL
Anion gap: 16 — ABNORMAL HIGH (ref 5–15)
BUN: 42 mg/dL — ABNORMAL HIGH (ref 6–20)
CHLORIDE: 92 mmol/L — AB (ref 101–111)
CO2: 25 mmol/L (ref 22–32)
CREATININE: 13.65 mg/dL — AB (ref 0.61–1.24)
Calcium: 8.8 mg/dL — ABNORMAL LOW (ref 8.9–10.3)
GFR calc non Af Amer: 4 mL/min — ABNORMAL LOW (ref >60)
GFR, EST AFRICAN AMERICAN: 5 mL/min — AB (ref >60)
Glucose, Bld: 159 mg/dL — ABNORMAL HIGH (ref 65–99)
Potassium: 3.6 mmol/L (ref 3.5–5.1)
Sodium: 133 mmol/L — ABNORMAL LOW (ref 135–145)

## 2015-06-10 LAB — CBC
HCT: 39.6 % (ref 39.0–52.0)
Hemoglobin: 12.7 g/dL — ABNORMAL LOW (ref 13.0–17.0)
MCH: 27.4 pg (ref 26.0–34.0)
MCHC: 32.1 g/dL (ref 30.0–36.0)
MCV: 85.5 fL (ref 78.0–100.0)
PLATELETS: 283 10*3/uL (ref 150–400)
RBC: 4.63 MIL/uL (ref 4.22–5.81)
RDW: 15.4 % (ref 11.5–15.5)
WBC: 6.2 10*3/uL (ref 4.0–10.5)

## 2015-06-10 LAB — I-STAT TROPONIN, ED: Troponin i, poc: 0.01 ng/mL (ref 0.00–0.08)

## 2015-06-10 MED ORDER — GI COCKTAIL ~~LOC~~
30.0000 mL | Freq: Once | ORAL | Status: AC
  Administered 2015-06-10: 30 mL via ORAL
  Filled 2015-06-10: qty 30

## 2015-06-10 MED ORDER — HYDROCODONE-ACETAMINOPHEN 5-325 MG PO TABS
1.0000 | ORAL_TABLET | Freq: Once | ORAL | Status: AC
  Administered 2015-06-10: 1 via ORAL
  Filled 2015-06-10: qty 1

## 2015-06-10 MED ORDER — FAMOTIDINE 20 MG PO TABS
20.0000 mg | ORAL_TABLET | Freq: Once | ORAL | Status: AC
  Administered 2015-06-10: 20 mg via ORAL
  Filled 2015-06-10: qty 1

## 2015-06-10 NOTE — ED Notes (Signed)
PER EMS: Patient comes from home c/o substernal non-radiating L CP since 1815 today upon waking up from a nap.  Patient states he feels slight SOB and nausea, no vomiting.  Denies dizziness/lightheadedness.  Patient reports hx MI 6 months ago, but this pain "feels different from that."  Patient is dialysis pt MWF, went yesterday with no problems.  EMS VS: 121/97, 70 NSR, 100% RA, 18 RR, 219 CBG.  EMS gave 324 ASA with no relief.  Pt A&O x 4, NAD at this time.

## 2015-06-10 NOTE — ED Notes (Signed)
Patient asked this RN if the MD knew what the test results were at about 2315 because his ride was here to get him.  This RN notified Dr. Ashok Cordia that patient had stated he was ready for his test results and that his ride was here.  This RN then notified patient MD was aware.  Upon checking on the patient 10 minutes later, BP cuff, leads, and pulse ox monitor were all laying on empty bed and patient and his belongings were all gone.  MD and Charge RN made aware.

## 2015-06-10 NOTE — ED Provider Notes (Signed)
CSN: [id number]     Arrival date & time 06/10/15  2050 History   First MD Initiated Contact with Patient 06/10/15 2056     Chief Complaint  Patient presents with  . Chest Pain     (Consider location/radiation/quality/duration/timing/severity/associated sxs/prior Treatment) Patient is a 39 y.o. male presenting with chest pain. The history is provided by the patient.  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 Pt constant, dull, moderate, non radiating. Pain occurs at rest. No associated sob, nv or diaphoresis. Pain is not pleuritic. Pt denies any recent unusual doe or fatigue. No lower extremity pain or swelling. No recent travel, immobility, surgery, or trauma. Denies hx cad. No fam hx premature cad.  Non smoker. No cocaine use. Denies hx PE.  Pt denies heartburn.  No chest wall injury or strain. Had normal dialysis yesterday.     Past Medical History  Diagnosis Date  . Diabetes mellitus   . Renal disorder dialysis    MWF [facility]  . Hypertension    Past Surgical History  Procedure Laterality Date  . Av fistula placement  left arm  . Amputation Left 09/27/2013    Procedure: LEFT GREAT TOE AMPUTATION;  Surgeon: [name], MD;  Location: [place];  Service: Orthopedics;  Laterality: Left;  . Left heart catheterization with coronary angiogram N/A 09/13/2014    Procedure: LEFT HEART CATHETERIZATION WITH CORONARY ANGIOGRAM;  Surgeon: Sinclair Grooms, MD;  Location: [facility] CATH LAB;  Service: Cardiovascular;  Laterality: N/A;   Family History  Problem Relation Age of Onset  . Heart failure Mother   . Hypertension Mother    Social History  Substance Use Topics  . Smoking status: Former Smoker -- 0.25 packs/day for 20 years    Types: Cigarettes    Quit date: 09/27/2014  . Smokeless  tobacco: Never Used  . Alcohol Use: Yes    Review of Systems  Constitutional: Negative for fever and chills.  HENT: Negative for congestion and sore throat.   Eyes: Negative for redness.  Respiratory: Negative for cough and shortness of breath.   Cardiovascular: Positive for chest pain. Negative for palpitations and leg swelling.  Gastrointestinal: Negative for vomiting, abdominal pain and diarrhea.  Genitourinary: Negative for flank pain.  Musculoskeletal: Negative for back pain and neck pain.  Skin: Negative for rash.  Neurological: Negative for headaches.  Hematological: Does not bruise/bleed easily.  Psychiatric/Behavioral: Negative for confusion.      Allergies  Coconut oil  Home Medications   Prior to Admission medications   Medication Sig Start Date End Date Taking? Authorizing Provider  acetaminophen (TYLENOL) 500 MG tablet Take 1,000 mg by mouth every 6 (six) hours as needed for mild pain.   Yes Historical Provider, MD  aspirin-acetaminophen-caffeine (EXCEDRIN MIGRAINE) [phone] MG per tablet Take 2 tablets by mouth every 6 (six) hours as needed for headache.   Yes Historical Provider, MD  atorvastatin (LIPITOR) 80 MG tablet Take 1 tablet (80 mg total) by mouth daily at 6 PM. 09/15/14  Yes Francesca Oman, DO  calcium acetate (PHOSLO) 667 MG capsule Take 3 capsules (2,001 mg total) by mouth 3 (three) times daily with meals. Patient taking differently: Take 667 mg by mouth 3 (three) times daily with meals.  08/08/13  Yes Orlando  Wendee Beavers, MD  insulin glargine (LANTUS) 100 UNIT/ML injection Inject 0.1 mLs (10 Units total) into the skin at bedtime. Patient taking differently: Inject 48 Units into the skin at bedtime.  09/15/14  Yes [name], DO  amLODipine (NORVASC) 5 MG tablet Take 1 tablet (5 mg total) by mouth at bedtime. Patient not taking: Reported on 06/10/2015 08/08/13   Velvet Bathe, MD  dicyclomine (BENTYL) 20 MG tablet Take 1 tablet (20 mg total) by mouth 2 (two)  times daily. Patient not taking: Reported on 06/10/2015 04/24/15   Orpah Greek, MD  hydrALAZINE (APRESOLINE) 25 MG tablet Take 1 tablet (25 mg total) by mouth 3 (three) times daily. Patient not taking: Reported on 06/10/2015 09/15/14   Francesca Oman, DO  lanthanum (FOSRENOL) 1000 MG chewable tablet Chew 1 tablet (1,000 mg total) by mouth 3 (three) times daily with meals. Patient not taking: Reported on 06/10/2015 08/08/13   Velvet Bathe, MD  ranitidine (ZANTAC) 150 MG tablet Take 1 tablet (150 mg total) by mouth 2 (two) times daily. Patient not taking: Reported on 06/10/2015 04/24/15   Orpah Greek, MD  traMADol (ULTRAM) 50 MG tablet Take 1 tablet (50 mg total) by mouth every 6 (six) hours as needed. Patient not taking: Reported on 06/10/2015 04/24/15   Orpah Greek, MD   BP 165/62 mmHg  Pulse 63  Temp(Src) 98.3 F (36.8 C) (Oral)  Resp 18  Ht 6\' 2"  (1.88 m)  Wt 202 lb 13.2 oz (92 kg)  BMI 26.03 kg/m2  SpO2 100% Physical Exam  Constitutional: He is oriented to person, place, and time. He appears well-developed and well-nourished. No distress.  HENT:  Mouth/Throat: Oropharynx is clear and moist.  Eyes: Conjunctivae are normal. No scleral icterus.  Neck: Neck supple. No tracheal deviation present.  Cardiovascular: Normal rate, regular rhythm, normal heart sounds and intact distal pulses.  Exam reveals no gallop and no friction rub.   No murmur heard. Pulmonary/Chest: Effort normal. No accessory muscle usage. No respiratory distress. He exhibits tenderness.  Abdominal: Soft. Bowel sounds are normal. He exhibits no distension. There is no tenderness.  Musculoskeletal: Normal range of motion. He exhibits no edema or tenderness.  Left upper ext av fistula w palp thrill.   Neurological: He is alert and oriented to person, place, and time.  Skin: Skin is warm and dry. No rash noted. He is not diaphoretic.  Small, 2-3 mm superficial ulcer to plantar aspect right great  toe. Normal cap refill. No sign of infection.   Psychiatric: He has a normal mood and affect.  Nursing note and vitals reviewed.   ED Course  Procedures (including critical care time) Labs Review    Results for orders placed or performed during the hospital encounter of [id number]  Basic metabolic panel  Result Value Ref Range   Sodium 133 (L) 135 - 145 mmol/L   Potassium 3.6 3.5 - 5.1 mmol/L   Chloride 92 (L) 101 - 111 mmol/L   CO2 25 22 - 32 mmol/L   Glucose, Bld 159 (H) 65 - 99 mg/dL   BUN 42 (H) 6 - 20 mg/dL   Creatinine, Ser 13.65 (H) 0.61 - 1.24 mg/dL   Calcium 8.8 (L) 8.9 - 10.3 mg/dL   GFR calc non Af Amer 4 (L) >60 mL/min   GFR calc Af Amer 5 (L) >60 mL/min   Anion gap 16 (H) 5 - 15  CBC  Result Value Ref Range   WBC 6.2 4.0 -  10.5 K/uL   RBC 4.63 4.22 - 5.81 MIL/uL   Hemoglobin 12.7 (L) 13.0 - 17.0 g/dL   HCT 39.6 39.0 - 52.0 %   MCV 85.5 78.0 - 100.0 fL   MCH 27.4 26.0 - 34.0 pg   MCHC 32.1 30.0 - 36.0 g/dL   RDW 15.4 11.5 - 15.5 %   Platelets 283 150 - 400 K/uL  I-stat troponin, ED  Result Value Ref Range   Troponin i, poc 0.01 0.00 - 0.08 ng/mL   Comment 3           Dg Chest 2 View  06/10/2015  CLINICAL DATA:  Pt states he has had mid to left chest pains since 6pm tonight, hx htn, diabetes, dialysis, smoker 1/2 ppd EXAM: CHEST - 2 VIEW COMPARISON:  04/24/2015 FINDINGS: Mild cardiomegaly.  Lungs are clear.  No pneumothorax. No effusion. Visualized skeletal structures are unremarkable. IMPRESSION: Mild cardiomegaly. Electronically Signed   By: Lucrezia Europe M.D.   On: 06/10/2015 21:42       I have personally reviewed and evaluated these images and lab results as part of my medical decision-making.   EKG Interpretation   Date/Time:  Tuesday June 10 2015 20:51:35 EDT Ventricular Rate:  64 PR Interval:  167 QRS Duration: 100 QT Interval:  437 QTC Calculation: 451 R Axis:   63 Text Interpretation:  Sinus rhythm Nonspecific T abnormalities, lateral   leads No significant change since last tracing Confirmed by Ashok Cordia  MD,  Lennette Bihari ([id number]) on 06/10/2015 8:57:14 PM      MDM   Iv ns. Labs. Continuous pulse ox and monitor. o2 .   Cxr.   Reviewed nursing notes and prior charts for additional history.   Reviewed cath report from earlier this year, pt w no significant obstructive disease per that cath.  Pt given gi meds, and pain med for symptom relief.  Trop neg and ecg similar to prior.  Went to reassess pt - pt not in room or anywhere in ED - patient had left ED AMA prior to completion of his evaluation and treatment.       Lajean Saver, MD 06/10/15 [phone]

## 2015-08-14 ENCOUNTER — Encounter (HOSPITAL_COMMUNITY): Payer: Self-pay | Admitting: Emergency Medicine

## 2015-08-14 ENCOUNTER — Emergency Department (HOSPITAL_COMMUNITY): Payer: Medicaid Other

## 2015-08-14 ENCOUNTER — Inpatient Hospital Stay (HOSPITAL_COMMUNITY)
Admission: EM | Admit: 2015-08-14 | Discharge: 2015-08-16 | DRG: 617 | Disposition: A | Discharge status: Home / Self Care | Payer: Medicaid Other | Attending: Internal Medicine | Admitting: Internal Medicine

## 2015-08-14 DIAGNOSIS — E1169 Type 2 diabetes mellitus with other specified complication: Secondary | ICD-10-CM

## 2015-08-14 DIAGNOSIS — D631 Anemia in chronic kidney disease: Secondary | ICD-10-CM

## 2015-08-14 DIAGNOSIS — E11621 Type 2 diabetes mellitus with foot ulcer: Secondary | ICD-10-CM | POA: Diagnosis present

## 2015-08-14 DIAGNOSIS — M869 Osteomyelitis, unspecified: Secondary | ICD-10-CM

## 2015-08-14 DIAGNOSIS — L97519 Non-pressure chronic ulcer of other part of right foot with unspecified severity: Secondary | ICD-10-CM | POA: Diagnosis present

## 2015-08-14 DIAGNOSIS — E1051 Type 1 diabetes mellitus with diabetic peripheral angiopathy without gangrene: Secondary | ICD-10-CM | POA: Diagnosis present

## 2015-08-14 DIAGNOSIS — M868X7 Other osteomyelitis, ankle and foot: Secondary | ICD-10-CM | POA: Diagnosis present

## 2015-08-14 DIAGNOSIS — Z89412 Acquired absence of left great toe: Secondary | ICD-10-CM

## 2015-08-14 DIAGNOSIS — L089 Local infection of the skin and subcutaneous tissue, unspecified: Secondary | ICD-10-CM

## 2015-08-14 DIAGNOSIS — I10 Essential (primary) hypertension: Secondary | ICD-10-CM | POA: Diagnosis present

## 2015-08-14 DIAGNOSIS — M898X9 Other specified disorders of bone, unspecified site: Secondary | ICD-10-CM | POA: Diagnosis present

## 2015-08-14 DIAGNOSIS — T148XXA Other injury of unspecified body region, initial encounter: Secondary | ICD-10-CM

## 2015-08-14 DIAGNOSIS — L97509 Non-pressure chronic ulcer of other part of unspecified foot with unspecified severity: Secondary | ICD-10-CM

## 2015-08-14 DIAGNOSIS — N2581 Secondary hyperparathyroidism of renal origin: Secondary | ICD-10-CM | POA: Diagnosis present

## 2015-08-14 DIAGNOSIS — I132 Hypertensive heart and chronic kidney disease with heart failure and with stage 5 chronic kidney disease, or end stage renal disease: Secondary | ICD-10-CM | POA: Diagnosis present

## 2015-08-14 DIAGNOSIS — I509 Heart failure, unspecified: Secondary | ICD-10-CM | POA: Diagnosis present

## 2015-08-14 DIAGNOSIS — Z87891 Personal history of nicotine dependence: Secondary | ICD-10-CM

## 2015-08-14 DIAGNOSIS — N186 End stage renal disease: Secondary | ICD-10-CM | POA: Diagnosis present

## 2015-08-14 DIAGNOSIS — E10621 Type 1 diabetes mellitus with foot ulcer: Principal | ICD-10-CM | POA: Diagnosis present

## 2015-08-14 DIAGNOSIS — E1069 Type 1 diabetes mellitus with other specified complication: Secondary | ICD-10-CM | POA: Diagnosis present

## 2015-08-14 DIAGNOSIS — Z79899 Other long term (current) drug therapy: Secondary | ICD-10-CM

## 2015-08-14 DIAGNOSIS — E1022 Type 1 diabetes mellitus with diabetic chronic kidney disease: Secondary | ICD-10-CM | POA: Diagnosis present

## 2015-08-14 DIAGNOSIS — Z7982 Long term (current) use of aspirin: Secondary | ICD-10-CM

## 2015-08-14 DIAGNOSIS — D509 Iron deficiency anemia, unspecified: Secondary | ICD-10-CM | POA: Diagnosis present

## 2015-08-14 DIAGNOSIS — Z794 Long term (current) use of insulin: Secondary | ICD-10-CM

## 2015-08-14 DIAGNOSIS — Z91018 Allergy to other foods: Secondary | ICD-10-CM

## 2015-08-14 DIAGNOSIS — N189 Chronic kidney disease, unspecified: Secondary | ICD-10-CM

## 2015-08-14 DIAGNOSIS — Z992 Dependence on renal dialysis: Secondary | ICD-10-CM

## 2015-08-14 HISTORY — DX: Chronic combined systolic (congestive) and diastolic (congestive) heart failure: I50.42

## 2015-08-14 HISTORY — DX: Atherosclerotic heart disease of native coronary artery without angina pectoris: I25.10

## 2015-08-14 LAB — CBC WITH DIFFERENTIAL/PLATELET
BASOS PCT: 1 %
Basophils Absolute: 0 10*3/uL (ref 0.0–0.1)
Eosinophils Absolute: 0.3 10*3/uL (ref 0.0–0.7)
Eosinophils Relative: 3 %
HEMATOCRIT: 27.3 % — AB (ref 39.0–52.0)
HEMOGLOBIN: 8.6 g/dL — AB (ref 13.0–17.0)
LYMPHS ABS: 2.3 10*3/uL (ref 0.7–4.0)
LYMPHS PCT: 29 %
MCH: 26.2 pg (ref 26.0–34.0)
MCHC: 31.5 g/dL (ref 30.0–36.0)
MCV: 83.2 fL (ref 78.0–100.0)
MONO ABS: 0.6 10*3/uL (ref 0.1–1.0)
MONOS PCT: 7 %
NEUTROS ABS: 4.9 10*3/uL (ref 1.7–7.7)
NEUTROS PCT: 60 %
Platelets: 523 10*3/uL — ABNORMAL HIGH (ref 150–400)
RBC: 3.28 MIL/uL — ABNORMAL LOW (ref 4.22–5.81)
RDW: 16.1 % — AB (ref 11.5–15.5)
WBC: 8.1 10*3/uL (ref 4.0–10.5)

## 2015-08-14 LAB — COMPREHENSIVE METABOLIC PANEL
ALBUMIN: 3.1 g/dL — AB (ref 3.5–5.0)
ALK PHOS: 53 U/L (ref 38–126)
ALT: <5 U/L — ABNORMAL LOW (ref 17–63)
ANION GAP: 12 (ref 5–15)
AST: 16 U/L (ref 15–41)
BILIRUBIN TOTAL: 0.5 mg/dL (ref 0.3–1.2)
BUN: 22 mg/dL — AB (ref 6–20)
CALCIUM: 9.2 mg/dL (ref 8.9–10.3)
CO2: 30 mmol/L (ref 22–32)
Chloride: 92 mmol/L — ABNORMAL LOW (ref 101–111)
Creatinine, Ser: 9.08 mg/dL — ABNORMAL HIGH (ref 0.61–1.24)
GFR calc Af Amer: 8 mL/min — ABNORMAL LOW (ref >60)
GFR, EST NON AFRICAN AMERICAN: 7 mL/min — AB (ref >60)
GLUCOSE: 146 mg/dL — AB (ref 65–99)
POTASSIUM: 3.8 mmol/L (ref 3.5–5.1)
Sodium: 134 mmol/L — ABNORMAL LOW (ref 135–145)
TOTAL PROTEIN: 7.2 g/dL (ref 6.5–8.1)

## 2015-08-14 MED ORDER — PIPERACILLIN-TAZOBACTAM 3.375 G IVPB 30 MIN
3.3750 g | Freq: Once | INTRAVENOUS | Status: AC
  Administered 2015-08-15: 3.375 g via INTRAVENOUS
  Filled 2015-08-14: qty 50

## 2015-08-14 MED ORDER — MORPHINE SULFATE (PF) 4 MG/ML IV SOLN
4.0000 mg | Freq: Once | INTRAVENOUS | Status: AC
  Administered 2015-08-15: 4 mg via INTRAVENOUS
  Filled 2015-08-14: qty 1

## 2015-08-14 MED ORDER — VANCOMYCIN HCL IN DEXTROSE 1-5 GM/200ML-% IV SOLN
1000.0000 mg | Freq: Once | INTRAVENOUS | Status: AC
  Administered 2015-08-15: 1000 mg via INTRAVENOUS
  Filled 2015-08-14: qty 200

## 2015-08-14 NOTE — ED Provider Notes (Signed)
CSN: [id number]     Arrival date & time 08/14/15  1856 History  By signing my name below, I, [name], attest that this documentation has been prepared under the direction and in the presence of [name] Seelman, MD. Electronically Signed: Helane [name], ED Scribe. 08/14/2015. 11:36 PM.     Chief Complaint  Patient presents with  . Wound Infection   Patient is a 39 y.o. male presenting with wound check. The history is provided by the patient. No language interpreter was used.  Wound Check This is a chronic problem. The current episode started more than 1 week ago. The problem occurs constantly. The problem has been gradually worsening. Pertinent negatives include no chest pain. Nothing aggravates the symptoms. Nothing relieves the symptoms. Treatments tried: abx at dialysis. The treatment provided no relief.   HPI Comments: [name]. is a 39 y.o. male former smoker with a PMHx od DM, renal disorder, and HTN who presents to the Emergency Department complaining of a worsening right great toe infection onset 18 days ago, worsening as of today. Pt states he has been seeing his physician for this and is scheduled to see him again on 1/5. He states he is currently on antibiotics and was also given mupirocin ointment. He notes no improvement of the wound since beginning treatment. He reports associated purulent drainage and black tissue discoloration, as well as constipation from his pain medication. He notes a PMHx of the same when his other toe had to be amputated due to the extensive infection. He states he had his last dialysis today, where he was also given his antibiotics. He notes he has been on dialysis for the past 10 years. Pt denies fever, diarrhea, and n/v.   Past Medical History  Diagnosis Date  . Diabetes mellitus   . Renal disorder dialysis    MWF [facility]  . Hypertension    Past Surgical History  Procedure Laterality Date  . Av fistula placement  left arm  .  Amputation Left 09/27/2013    Procedure: LEFT GREAT TOE AMPUTATION;  Surgeon: [name], MD;  Location: [place];  Service: Orthopedics;  Laterality: Left;  . Left heart catheterization with coronary angiogram N/A 09/13/2014    Procedure: LEFT HEART CATHETERIZATION WITH CORONARY ANGIOGRAM;  Surgeon: Sinclair Grooms, MD;  Location: [facility] CATH LAB;  Service: Cardiovascular;  Laterality: N/A;   Family History  Problem Relation Age of Onset  . Heart failure Mother   . Hypertension Mother    Social History  Substance Use Topics  . Smoking status: Former Smoker -- 0.25 packs/day for 0 years    Types: Cigarettes    Quit date: 09/27/2014  . Smokeless tobacco: Never Used  . Alcohol Use: Yes    Review of Systems  Constitutional: Negative for fever.  Cardiovascular: Negative for chest pain.  Gastrointestinal: Positive for constipation. Negative for nausea, vomiting and diarrhea.  Skin: Positive for wound.  All other systems reviewed and are negative.   Allergies  Coconut oil  Home Medications   Prior to Admission medications   Medication Sig Start Date End Date Taking? Authorizing Provider  acetaminophen (TYLENOL) 500 MG tablet Take 1,000 mg by mouth every 6 (six) hours as needed for mild pain.   Yes Historical Provider, MD  aspirin-acetaminophen-caffeine (EXCEDRIN MIGRAINE) [phone] MG per tablet Take 2 tablets by mouth every 6 (six) hours as needed for headache.   Yes Historical Provider, MD  atorvastatin (LIPITOR) 80 MG tablet Take  1 tablet (80 mg total) by mouth daily at 6 PM. 09/15/14  Yes Francesca Oman, DO  calcium acetate (PHOSLO) 667 MG capsule Take 3 capsules (2,001 mg total) by mouth 3 (three) times daily with meals. Patient taking differently: Take 667 mg by mouth 3 (three) times daily with meals.  08/08/13  Yes Velvet Bathe, MD  insulin glargine (LANTUS) 100 UNIT/ML injection Inject 0.1 mLs (10 Units total) into the skin at bedtime. Patient taking differently: Inject 48 Units  into the skin at bedtime.  09/15/14  Yes [name], DO  amLODipine (NORVASC) 5 MG tablet Take 1 tablet (5 mg total) by mouth at bedtime. Patient not taking: Reported on 06/10/2015 08/08/13   Velvet Bathe, MD  dicyclomine (BENTYL) 20 MG tablet Take 1 tablet (20 mg total) by mouth 2 (two) times daily. Patient not taking: Reported on 06/10/2015 04/24/15   Orpah Greek, MD  hydrALAZINE (APRESOLINE) 25 MG tablet Take 1 tablet (25 mg total) by mouth 3 (three) times daily. Patient not taking: Reported on 06/10/2015 09/15/14   Francesca Oman, DO  lanthanum (FOSRENOL) 1000 MG chewable tablet Chew 1 tablet (1,000 mg total) by mouth 3 (three) times daily with meals. Patient not taking: Reported on 06/10/2015 08/08/13   Velvet Bathe, MD  ranitidine (ZANTAC) 150 MG tablet Take 1 tablet (150 mg total) by mouth 2 (two) times daily. Patient not taking: Reported on 06/10/2015 04/24/15   Orpah Greek, MD  traMADol (ULTRAM) 50 MG tablet Take 1 tablet (50 mg total) by mouth every 6 (six) hours as needed. Patient not taking: Reported on 06/10/2015 04/24/15   Orpah Greek, MD   BP 189/95 mmHg  Pulse 92  Temp(Src) 98.4 F (36.9 C) (Oral)  Resp 16  Ht 6\' 2"  (1.88 m)  Wt 198 lb (89.812 kg)  BMI 25.41 kg/m2  SpO2 100% Physical Exam  Constitutional: He is oriented to person, place, and time. He appears well-developed and well-nourished.  HENT:  Head: Normocephalic and atraumatic.  Mouth/Throat: Oropharynx is clear and moist.  Eyes: Conjunctivae are normal. Pupils are equal, round, and reactive to light. Right eye exhibits no discharge. Left eye exhibits no discharge.  Neck: Normal range of motion. Neck supple.  Cardiovascular: Normal rate, regular rhythm, normal heart sounds and intact distal pulses.   Pulmonary/Chest: Effort normal and breath sounds normal. No respiratory distress. He has no wheezes. He has no rales.  Abdominal: Soft. Bowel sounds are normal. He exhibits no distension.  There is no tenderness. There is no rebound and no guarding.  Musculoskeletal: Normal range of motion. He exhibits no edema.  Neurological: He is alert and oriented to person, place, and time. He has normal reflexes. Coordination normal.  Skin: Skin is warm and dry. No rash noted. He is not diaphoretic.  Stage 1 ulceration on the platar side of the R great toe.  Ulceration 1 cm in diameter on medial aspect of R great toe, no drainage, there is fibrinous debree in the wound, NVI  Psychiatric: He has a normal mood and affect.  Nursing note and vitals reviewed.   ED Course  Procedures  DIAGNOSTIC STUDIES: Oxygen Saturation is 98% on RA, normal by my interpretation.    COORDINATION OF CARE: 11:31 PM - Discussed Xr results of bone infection. Discussed plans to admit. Pt advised of plan for treatment and pt agrees.  Labs Review Labs Reviewed  CBC WITH DIFFERENTIAL/PLATELET - Abnormal; Notable for the following:    RBC 3.28 (*)  Hemoglobin 8.6 (*)    HCT 27.3 (*)    RDW 16.1 (*)    Platelets 523 (*)    All other components within normal limits  COMPREHENSIVE METABOLIC PANEL - Abnormal; Notable for the following:    Sodium 134 (*)    Chloride 92 (*)    Glucose, Bld 146 (*)    BUN 22 (*)    Creatinine, Ser 9.08 (*)    Albumin 3.1 (*)    ALT <5 (*)    GFR calc non Af Amer 7 (*)    GFR calc Af Amer 8 (*)    All other components within normal limits  CULTURE, BLOOD (ROUTINE X 2)  CULTURE, BLOOD (ROUTINE X 2)    Imaging Review Dg Toe Great Right  08/14/2015  CLINICAL DATA:  Medial right great toe pain at the nail bed with draining infection. Diabetes. EXAM: RIGHT GREAT TOE COMPARISON:  None. FINDINGS: Diffuse distal foot soft tissue swelling. There is some focal osteopenia in the medial aspect of the proximal portion of the first distal phalanx. Otherwise, no bone destruction or periosteal reaction. No soft tissue gas. Extensive arterial calcifications. IMPRESSION: 1. Focal  osteopenia in the medial aspect of the proximal portion of the first distal phalanx. This could be an early indication of osteomyelitis. Recommend consideration of an MRI of the foot without and with contrast. 2. Extensive atheromatous arterial calcifications. Electronically Signed   By: Claudie Revering M.D.   On: 08/14/2015 21:03   I have personally reviewed and evaluated these images and lab results as part of my medical decision-making.   EKG Interpretation None      MDM   Final diagnoses:  None    Medications  vancomycin (VANCOCIN) IVPB 1000 mg/200 mL premix (1,000 mg Intravenous New Bag/Given 08/15/15 0032)  calcium acetate (PHOSLO) capsule 667 mg (not administered)  atorvastatin (LIPITOR) tablet 80 mg (not administered)  acetaminophen (TYLENOL) tablet 1,000 mg (not administered)  aspirin-acetaminophen-caffeine (EXCEDRIN MIGRAINE) per tablet 2 tablet (not administered)  insulin glargine (LANTUS) injection 30 Units (not administered)  piperacillin-tazobactam (ZOSYN) IVPB 3.375 g (3.375 g Intravenous New Bag/Given 08/15/15 0032)  morphine 4 MG/ML injection 4 mg (4 mg Intravenous Given 08/15/15 0031)     I personally performed the services described in this documentation, which was scribed in my presence. The recorded information has been reviewed and is accurate.     Veatrice Kells, MD 08/15/15 [phone]

## 2015-08-14 NOTE — ED Notes (Signed)
Pt. reports worsening right great toe infection with swelling and drainage for 2 weeks unrelieved by antibiotic ointment. Denies fever or chills. Hemodialysis q Tues/Thurs/Sat.

## 2015-08-15 ENCOUNTER — Inpatient Hospital Stay (HOSPITAL_COMMUNITY): Payer: Medicaid Other | Admitting: Certified Registered"

## 2015-08-15 ENCOUNTER — Other Ambulatory Visit (HOSPITAL_COMMUNITY): Payer: Self-pay | Admitting: Orthopedic Surgery

## 2015-08-15 ENCOUNTER — Encounter (HOSPITAL_COMMUNITY): Payer: Self-pay | Admitting: Emergency Medicine

## 2015-08-15 ENCOUNTER — Ambulatory Visit (HOSPITAL_COMMUNITY): Payer: Medicaid Other | Attending: Internal Medicine

## 2015-08-15 ENCOUNTER — Encounter (HOSPITAL_COMMUNITY)
Admission: EM | Disposition: A | Discharge status: Home / Self Care | Payer: Self-pay | Source: Home / Self Care | Attending: Internal Medicine

## 2015-08-15 DIAGNOSIS — L97519 Non-pressure chronic ulcer of other part of right foot with unspecified severity: Secondary | ICD-10-CM | POA: Diagnosis present

## 2015-08-15 DIAGNOSIS — N186 End stage renal disease: Secondary | ICD-10-CM

## 2015-08-15 DIAGNOSIS — L089 Local infection of the skin and subcutaneous tissue, unspecified: Secondary | ICD-10-CM | POA: Diagnosis not present

## 2015-08-15 DIAGNOSIS — E1022 Type 1 diabetes mellitus with diabetic chronic kidney disease: Secondary | ICD-10-CM | POA: Diagnosis present

## 2015-08-15 DIAGNOSIS — E10621 Type 1 diabetes mellitus with foot ulcer: Secondary | ICD-10-CM | POA: Diagnosis present

## 2015-08-15 DIAGNOSIS — E1069 Type 1 diabetes mellitus with other specified complication: Secondary | ICD-10-CM | POA: Diagnosis present

## 2015-08-15 DIAGNOSIS — E1051 Type 1 diabetes mellitus with diabetic peripheral angiopathy without gangrene: Secondary | ICD-10-CM | POA: Diagnosis present

## 2015-08-15 DIAGNOSIS — T148XXA Other injury of unspecified body region, initial encounter: Secondary | ICD-10-CM

## 2015-08-15 DIAGNOSIS — I132 Hypertensive heart and chronic kidney disease with heart failure and with stage 5 chronic kidney disease, or end stage renal disease: Secondary | ICD-10-CM | POA: Diagnosis present

## 2015-08-15 DIAGNOSIS — M898X9 Other specified disorders of bone, unspecified site: Secondary | ICD-10-CM | POA: Diagnosis present

## 2015-08-15 DIAGNOSIS — N2581 Secondary hyperparathyroidism of renal origin: Secondary | ICD-10-CM | POA: Diagnosis present

## 2015-08-15 DIAGNOSIS — T148 Other injury of unspecified body region: Secondary | ICD-10-CM | POA: Diagnosis not present

## 2015-08-15 DIAGNOSIS — L97509 Non-pressure chronic ulcer of other part of unspecified foot with unspecified severity: Secondary | ICD-10-CM

## 2015-08-15 DIAGNOSIS — E11621 Type 2 diabetes mellitus with foot ulcer: Secondary | ICD-10-CM | POA: Diagnosis present

## 2015-08-15 DIAGNOSIS — Z89412 Acquired absence of left great toe: Secondary | ICD-10-CM | POA: Diagnosis not present

## 2015-08-15 DIAGNOSIS — Z87891 Personal history of nicotine dependence: Secondary | ICD-10-CM | POA: Diagnosis not present

## 2015-08-15 DIAGNOSIS — Z992 Dependence on renal dialysis: Secondary | ICD-10-CM | POA: Diagnosis not present

## 2015-08-15 DIAGNOSIS — Z91018 Allergy to other foods: Secondary | ICD-10-CM | POA: Diagnosis not present

## 2015-08-15 DIAGNOSIS — Z794 Long term (current) use of insulin: Secondary | ICD-10-CM | POA: Diagnosis not present

## 2015-08-15 DIAGNOSIS — I509 Heart failure, unspecified: Secondary | ICD-10-CM | POA: Diagnosis present

## 2015-08-15 DIAGNOSIS — M868X7 Other osteomyelitis, ankle and foot: Secondary | ICD-10-CM | POA: Diagnosis present

## 2015-08-15 DIAGNOSIS — Z7982 Long term (current) use of aspirin: Secondary | ICD-10-CM | POA: Diagnosis not present

## 2015-08-15 DIAGNOSIS — D509 Iron deficiency anemia, unspecified: Secondary | ICD-10-CM | POA: Diagnosis present

## 2015-08-15 DIAGNOSIS — D631 Anemia in chronic kidney disease: Secondary | ICD-10-CM | POA: Diagnosis present

## 2015-08-15 DIAGNOSIS — Z79899 Other long term (current) drug therapy: Secondary | ICD-10-CM | POA: Diagnosis not present

## 2015-08-15 HISTORY — PX: AMPUTATION: SHX166

## 2015-08-15 LAB — GLUCOSE, CAPILLARY
GLUCOSE-CAPILLARY: 186 mg/dL — AB (ref 65–99)
GLUCOSE-CAPILLARY: 97 mg/dL (ref 65–99)
GLUCOSE-CAPILLARY: 68 mg/dL (ref 65–99)
GLUCOSE-CAPILLARY: 134 mg/dL — AB (ref 65–99)
Glucose-Capillary: 109 mg/dL — ABNORMAL HIGH (ref 65–99)
Glucose-Capillary: 79 mg/dL (ref 65–99)
Glucose-Capillary: 66 mg/dL (ref 65–99)
Glucose-Capillary: 126 mg/dL — ABNORMAL HIGH (ref 65–99)
Glucose-Capillary: 155 mg/dL — ABNORMAL HIGH (ref 65–99)

## 2015-08-15 LAB — C-REACTIVE PROTEIN: CRP: 5.2 mg/dL — ABNORMAL HIGH (ref <1.0)

## 2015-08-15 LAB — HIV ANTIBODY (ROUTINE TESTING W REFLEX): HIV SCREEN 4TH GENERATION: NONREACTIVE

## 2015-08-15 LAB — PREALBUMIN: Prealbumin: 21.8 mg/dL (ref 18–38)

## 2015-08-15 LAB — POCT I-STAT 4, (NA,K, GLUC, HGB,HCT)
GLUCOSE: 82 mg/dL (ref 65–99)
HEMATOCRIT: 27 % — AB (ref 39.0–52.0)
HEMOGLOBIN: 9.2 g/dL — AB (ref 13.0–17.0)
Potassium: 3.7 mmol/L (ref 3.5–5.1)
SODIUM: 135 mmol/L (ref 135–145)

## 2015-08-15 LAB — MRSA PCR SCREENING: MRSA by PCR: NEGATIVE

## 2015-08-15 LAB — SEDIMENTATION RATE: SED RATE: 94 mm/h — AB (ref 0–16)

## 2015-08-15 SURGERY — AMPUTATION DIGIT
Anesthesia: General | Site: Foot | Laterality: Right

## 2015-08-15 MED ORDER — ATORVASTATIN CALCIUM 80 MG PO TABS
80.0000 mg | ORAL_TABLET | Freq: Every day | ORAL | Status: DC
  Administered 2015-08-16: 80 mg via ORAL
  Filled 2015-08-15: qty 1

## 2015-08-15 MED ORDER — DARBEPOETIN ALFA 60 MCG/0.3ML IJ SOSY
60.0000 ug | PREFILLED_SYRINGE | INTRAMUSCULAR | Status: DC
  Administered 2015-08-16: 60 ug via INTRAVENOUS
  Filled 2015-08-15: qty 0.3

## 2015-08-15 MED ORDER — INSULIN GLARGINE 100 UNIT/ML ~~LOC~~ SOLN: 30.0000 [IU] | Freq: Every day | SUBCUTANEOUS | Status: DC

## 2015-08-15 MED ORDER — SODIUM CHLORIDE 0.9 % IV SOLN: INTRAVENOUS | Status: DC

## 2015-08-15 MED ORDER — MIDAZOLAM HCL 5 MG/5ML IJ SOLN
INTRAMUSCULAR | Status: DC | PRN
Start: 2015-08-15 — End: 2015-08-15
  Administered 2015-08-15: 2 mg via INTRAVENOUS

## 2015-08-15 MED ORDER — FENTANYL CITRATE (PF) 100 MCG/2ML IJ SOLN
INTRAMUSCULAR | Status: AC
  Administered 2015-08-15: 50 ug via INTRAVENOUS
  Filled 2015-08-15: qty 2

## 2015-08-15 MED ORDER — VANCOMYCIN HCL IN DEXTROSE 1-5 GM/200ML-% IV SOLN
1000.0000 mg | INTRAVENOUS | Status: DC
Start: 2015-08-15 — End: 2015-08-15
  Administered 2015-08-15: 1000 mg via INTRAVENOUS
  Filled 2015-08-15: qty 200

## 2015-08-15 MED ORDER — DEXTROSE 50 % IV SOLN
25.0000 mL | Freq: Once | INTRAVENOUS | Status: AC
  Administered 2015-08-15: 25 mL via INTRAVENOUS

## 2015-08-15 MED ORDER — PROPOFOL 10 MG/ML IV BOLUS
INTRAVENOUS | Status: AC
  Filled 2015-08-15: qty 20

## 2015-08-15 MED ORDER — SODIUM CHLORIDE 0.9 % IV SOLN
INTRAVENOUS | Status: DC
  Administered 2015-08-15: 15:00:00 via INTRAVENOUS

## 2015-08-15 MED ORDER — ONDANSETRON HCL 4 MG/2ML IJ SOLN
INTRAMUSCULAR | Status: AC
  Filled 2015-08-15: qty 2

## 2015-08-15 MED ORDER — DEXTROSE 5 % IV SOLN
2.0000 g | INTRAVENOUS | Status: DC
  Administered 2015-08-15 – 2015-08-16 (×2): 2 g via INTRAVENOUS
  Filled 2015-08-15 (×3): qty 2

## 2015-08-15 MED ORDER — NEPRO/CARBSTEADY PO LIQD: 237.0000 mL | Freq: Two times a day (BID) | ORAL | Status: DC

## 2015-08-15 MED ORDER — OXYCODONE-ACETAMINOPHEN 5-325 MG PO TABS
1.0000 | ORAL_TABLET | ORAL | Status: DC | PRN
  Administered 2015-08-15 (×3): 2 via ORAL
  Filled 2015-08-15 (×2): qty 2

## 2015-08-15 MED ORDER — ONDANSETRON HCL 4 MG/2ML IJ SOLN
INTRAMUSCULAR | Status: DC | PRN
  Administered 2015-08-15: 4 mg via INTRAVENOUS

## 2015-08-15 MED ORDER — ACETAMINOPHEN 325 MG PO TABS: 650.0000 mg | ORAL_TABLET | Freq: Four times a day (QID) | ORAL | Status: DC | PRN

## 2015-08-15 MED ORDER — METOCLOPRAMIDE HCL 5 MG PO TABS: 5.0000 mg | ORAL_TABLET | Freq: Three times a day (TID) | ORAL | Status: DC | PRN

## 2015-08-15 MED ORDER — RENA-VITE PO TABS
1.0000 | ORAL_TABLET | Freq: Every day | ORAL | Status: DC
  Administered 2015-08-15: 1 via ORAL
  Filled 2015-08-15: qty 1

## 2015-08-15 MED ORDER — ACETAMINOPHEN 650 MG RE SUPP: 650.0000 mg | Freq: Four times a day (QID) | RECTAL | Status: DC | PRN

## 2015-08-15 MED ORDER — 0.9 % SODIUM CHLORIDE (POUR BTL) OPTIME
TOPICAL | Status: DC | PRN
  Administered 2015-08-15: 1000 mL

## 2015-08-15 MED ORDER — FENTANYL CITRATE (PF) 100 MCG/2ML IJ SOLN
INTRAMUSCULAR | Status: DC | PRN
Start: 2015-08-15 — End: 2015-08-15
  Administered 2015-08-15: 100 ug via INTRAVENOUS
  Administered 2015-08-15 (×3): 50 ug via INTRAVENOUS

## 2015-08-15 MED ORDER — FENTANYL CITRATE (PF) 250 MCG/5ML IJ SOLN
INTRAMUSCULAR | Status: AC
  Filled 2015-08-15: qty 5

## 2015-08-15 MED ORDER — MIDAZOLAM HCL 2 MG/2ML IJ SOLN
INTRAMUSCULAR | Status: AC
  Filled 2015-08-15: qty 2

## 2015-08-15 MED ORDER — OXYCODONE-ACETAMINOPHEN 5-325 MG PO TABS
ORAL_TABLET | ORAL | Status: AC
Start: 2015-08-15 — End: 2015-08-16
  Filled 2015-08-15: qty 2

## 2015-08-15 MED ORDER — PROMETHAZINE HCL 25 MG/ML IJ SOLN
6.2500 mg | INTRAMUSCULAR | Status: DC | PRN
Start: 2015-08-15 — End: 2015-08-15

## 2015-08-15 MED ORDER — FENTANYL CITRATE (PF) 100 MCG/2ML IJ SOLN
INTRAMUSCULAR | Status: AC
  Filled 2015-08-15: qty 2

## 2015-08-15 MED ORDER — CALCITRIOL 0.5 MCG PO CAPS
2.0000 ug | ORAL_CAPSULE | ORAL | Status: DC
  Administered 2015-08-16: 2 ug via ORAL
  Filled 2015-08-15: qty 4

## 2015-08-15 MED ORDER — DEXTROSE 50 % IV SOLN
INTRAVENOUS | Status: AC
  Administered 2015-08-15: 25 mL via INTRAVENOUS
  Filled 2015-08-15: qty 50

## 2015-08-15 MED ORDER — VANCOMYCIN HCL IN DEXTROSE 1-5 GM/200ML-% IV SOLN
1000.0000 mg | INTRAVENOUS | Status: DC
  Filled 2015-08-15: qty 200

## 2015-08-15 MED ORDER — LANTHANUM CARBONATE 500 MG PO CHEW
2000.0000 mg | CHEWABLE_TABLET | Freq: Three times a day (TID) | ORAL | Status: DC
  Filled 2015-08-15: qty 4

## 2015-08-15 MED ORDER — INSULIN ASPART 100 UNIT/ML ~~LOC~~ SOLN
0.0000 [IU] | Freq: Three times a day (TID) | SUBCUTANEOUS | Status: DC
  Administered 2015-08-16: 2 [IU] via SUBCUTANEOUS

## 2015-08-15 MED ORDER — INSULIN ASPART 100 UNIT/ML ~~LOC~~ SOLN: 0.0000 [IU] | SUBCUTANEOUS | Status: DC

## 2015-08-15 MED ORDER — DEXAMETHASONE SODIUM PHOSPHATE 4 MG/ML IJ SOLN
INTRAMUSCULAR | Status: AC
  Filled 2015-08-15: qty 2

## 2015-08-15 MED ORDER — ACETAMINOPHEN 500 MG PO TABS
1000.0000 mg | ORAL_TABLET | Freq: Four times a day (QID) | ORAL | Status: DC | PRN
  Administered 2015-08-15: 1000 mg via ORAL
  Filled 2015-08-15: qty 2

## 2015-08-15 MED ORDER — AMLODIPINE BESYLATE 10 MG PO TABS
10.0000 mg | ORAL_TABLET | Freq: Every day | ORAL | Status: DC
  Administered 2015-08-15: 10 mg via ORAL
  Filled 2015-08-15: qty 1

## 2015-08-15 MED ORDER — INSULIN GLARGINE 100 UNIT/ML ~~LOC~~ SOLN
10.0000 [IU] | Freq: Every day | SUBCUTANEOUS | Status: DC
  Administered 2015-08-15 (×2): 10 [IU] via SUBCUTANEOUS
  Filled 2015-08-15 (×4): qty 0.1

## 2015-08-15 MED ORDER — LIDOCAINE HCL (CARDIAC) 20 MG/ML IV SOLN
INTRAVENOUS | Status: AC
  Filled 2015-08-15: qty 5

## 2015-08-15 MED ORDER — SODIUM CHLORIDE 0.9 % IV SOLN: 62.5000 mg | INTRAVENOUS | Status: DC

## 2015-08-15 MED ORDER — KCL IN DEXTROSE-NACL 10-5-0.45 MEQ/L-%-% IV SOLN
INTRAVENOUS | Status: DC
  Administered 2015-08-15: 13:00:00 via INTRAVENOUS
  Filled 2015-08-15 (×2): qty 1000

## 2015-08-15 MED ORDER — OXYCODONE HCL 5 MG PO TABS
5.0000 mg | ORAL_TABLET | ORAL | Status: DC | PRN
  Administered 2015-08-15 – 2015-08-16 (×2): 10 mg via ORAL
  Administered 2015-08-16: 5 mg via ORAL
  Administered 2015-08-16: 10 mg via ORAL
  Filled 2015-08-15 (×3): qty 2

## 2015-08-15 MED ORDER — SODIUM CHLORIDE 0.9 % IV SOLN
INTRAVENOUS | Status: DC | PRN
  Administered 2015-08-15: 16:00:00 via INTRAVENOUS

## 2015-08-15 MED ORDER — ONDANSETRON HCL 4 MG PO TABS: 4.0000 mg | ORAL_TABLET | Freq: Four times a day (QID) | ORAL | Status: DC | PRN

## 2015-08-15 MED ORDER — PROPOFOL 10 MG/ML IV BOLUS
INTRAVENOUS | Status: DC | PRN
  Administered 2015-08-15: 180 mg via INTRAVENOUS

## 2015-08-15 MED ORDER — HYDROMORPHONE HCL 1 MG/ML IJ SOLN
1.0000 mg | INTRAMUSCULAR | Status: DC | PRN
  Administered 2015-08-15 – 2015-08-16 (×9): 1 mg via INTRAVENOUS
  Filled 2015-08-15 (×9): qty 1

## 2015-08-15 MED ORDER — METRONIDAZOLE 500 MG PO TABS
500.0000 mg | ORAL_TABLET | Freq: Three times a day (TID) | ORAL | Status: DC
  Administered 2015-08-15 – 2015-08-16 (×6): 500 mg via ORAL
  Filled 2015-08-15 (×6): qty 1

## 2015-08-15 MED ORDER — ROCURONIUM BROMIDE 50 MG/5ML IV SOLN
INTRAVENOUS | Status: AC
  Filled 2015-08-15: qty 1

## 2015-08-15 MED ORDER — FENTANYL CITRATE (PF) 100 MCG/2ML IJ SOLN
25.0000 ug | INTRAMUSCULAR | Status: DC | PRN
  Administered 2015-08-15 (×3): 50 ug via INTRAVENOUS

## 2015-08-15 MED ORDER — METHOCARBAMOL 500 MG PO TABS
ORAL_TABLET | ORAL | Status: AC
  Administered 2015-08-15: 500 mg via ORAL
  Filled 2015-08-15: qty 1

## 2015-08-15 MED ORDER — CALCIUM ACETATE (PHOS BINDER) 667 MG PO CAPS
667.0000 mg | ORAL_CAPSULE | Freq: Three times a day (TID) | ORAL | Status: DC
  Administered 2015-08-16 (×2): 667 mg via ORAL
  Filled 2015-08-15 (×3): qty 1

## 2015-08-15 MED ORDER — ASPIRIN-ACETAMINOPHEN-CAFFEINE 250-250-65 MG PO TABS
2.0000 | ORAL_TABLET | Freq: Four times a day (QID) | ORAL | Status: DC | PRN
  Filled 2015-08-15: qty 2

## 2015-08-15 MED ORDER — VANCOMYCIN HCL IN DEXTROSE 1-5 GM/200ML-% IV SOLN
1000.0000 mg | INTRAVENOUS | Status: AC
  Administered 2015-08-15: 1000 mg via INTRAVENOUS
  Filled 2015-08-15: qty 200

## 2015-08-15 MED ORDER — METOCLOPRAMIDE HCL 5 MG/ML IJ SOLN: 5.0000 mg | Freq: Three times a day (TID) | INTRAMUSCULAR | Status: DC | PRN

## 2015-08-15 MED ORDER — METHOCARBAMOL 500 MG PO TABS
500.0000 mg | ORAL_TABLET | Freq: Four times a day (QID) | ORAL | Status: DC | PRN
  Administered 2015-08-15: 500 mg via ORAL

## 2015-08-15 MED ORDER — METHOCARBAMOL 1000 MG/10ML IJ SOLN
500.0000 mg | Freq: Four times a day (QID) | INTRAVENOUS | Status: DC | PRN
  Filled 2015-08-15: qty 5

## 2015-08-15 MED ORDER — ONDANSETRON HCL 4 MG/2ML IJ SOLN
4.0000 mg | Freq: Four times a day (QID) | INTRAMUSCULAR | Status: DC | PRN
  Filled 2015-08-15: qty 2

## 2015-08-15 MED ORDER — LIDOCAINE HCL (CARDIAC) 20 MG/ML IV SOLN
INTRAVENOUS | Status: DC | PRN
  Administered 2015-08-15: 100 mg via INTRATRACHEAL

## 2015-08-15 SURGICAL SUPPLY — 35 items
BLADE SURG 21 STRL SS (BLADE) ×3 IMPLANT
BNDG CMPR 9X4 STRL LF SNTH (GAUZE/BANDAGES/DRESSINGS)
BNDG COHESIVE 4X5 TAN STRL (GAUZE/BANDAGES/DRESSINGS) ×3 IMPLANT
BNDG ESMARK 4X9 LF (GAUZE/BANDAGES/DRESSINGS) IMPLANT
BNDG GAUZE ELAST 4 BULKY (GAUZE/BANDAGES/DRESSINGS) ×3 IMPLANT
COVER SURGICAL LIGHT HANDLE (MISCELLANEOUS) ×6 IMPLANT
DRAPE U-SHAPE 47X51 STRL (DRAPES) ×3 IMPLANT
DRSG ADAPTIC 3X8 NADH LF (GAUZE/BANDAGES/DRESSINGS) ×2 IMPLANT
DRSG PAD ABDOMINAL 8X10 ST (GAUZE/BANDAGES/DRESSINGS) ×3 IMPLANT
DURAPREP 26ML APPLICATOR (WOUND CARE) ×3 IMPLANT
ELECT REM PT RETURN 9FT ADLT (ELECTROSURGICAL) ×3
ELECTRODE REM PT RTRN 9FT ADLT (ELECTROSURGICAL) ×1 IMPLANT
GAUZE SPONGE 4X4 12PLY STRL (GAUZE/BANDAGES/DRESSINGS) IMPLANT
GLOVE BIOGEL PI IND STRL 7.5 (GLOVE) IMPLANT
GLOVE BIOGEL PI IND STRL 9 (GLOVE) ×1 IMPLANT
GLOVE BIOGEL PI INDICATOR 7.5 (GLOVE) ×2
GLOVE BIOGEL PI INDICATOR 9 (GLOVE) ×2
GLOVE SKINSENSE NS SZ7.5 (GLOVE) ×2
GLOVE SKINSENSE STRL SZ7.5 (GLOVE) IMPLANT
GLOVE SURG ORTHO 9.0 STRL STRW (GLOVE) ×3 IMPLANT
GOWN STRL REUS W/ TWL XL LVL3 (GOWN DISPOSABLE) ×2 IMPLANT
GOWN STRL REUS W/TWL XL LVL3 (GOWN DISPOSABLE) ×6
KIT BASIN OR (CUSTOM PROCEDURE TRAY) ×3 IMPLANT
KIT ROOM TURNOVER OR (KITS) ×3 IMPLANT
MANIFOLD NEPTUNE II (INSTRUMENTS) ×3 IMPLANT
NEEDLE 22X1 1/2 (OR ONLY) (NEEDLE) IMPLANT
NS IRRIG 1000ML POUR BTL (IV SOLUTION) ×3 IMPLANT
PACK ORTHO EXTREMITY (CUSTOM PROCEDURE TRAY) ×3 IMPLANT
PAD ARMBOARD 7.5X6 YLW CONV (MISCELLANEOUS) ×6 IMPLANT
SPONGE GAUZE 4X4 12PLY STER LF (GAUZE/BANDAGES/DRESSINGS) ×2 IMPLANT
SUCTION FRAZIER TIP 10 FR DISP (SUCTIONS) IMPLANT
SUT ETHILON 2 0 PSLX (SUTURE) ×3 IMPLANT
SYR CONTROL 10ML LL (SYRINGE) IMPLANT
TOWEL OR 17X24 6PK STRL BLUE (TOWEL DISPOSABLE) ×3 IMPLANT
TOWEL OR 17X26 10 PK STRL BLUE (TOWEL DISPOSABLE) ×3 IMPLANT

## 2015-08-15 NOTE — Progress Notes (Signed)
Initial Nutrition Assessment  DOCUMENTATION CODES:   Not applicable  INTERVENTION:  Once diet advances, provide Nepro Shake po BID, each supplement provides 425 kcal and 19 grams protein.  NUTRITION DIAGNOSIS:   Increased nutrient needs related to wound healing as evidenced by estimated needs.  GOAL:   Patient will meet greater than or equal to 90% of their needs  MONITOR:   Supplement acceptance, Weight trends, Labs, I & O's, Diet advancement, Skin  REASON FOR ASSESSMENT:   Consult Wound healing  ASSESSMENT:   39 y.o. male with h/o DM, ESRD dialysis TTS, HTN. Patient presents to the ED with c/o worsening R great toe diabetic foot ulcer, necrosis, and infection.  Pt is currently NPO for possible surgery today. Pt reports great hunger during time of visit. Pt reports eating well PTA, however does say on some dialysis days he sometimes does not eat that whole day. Per Epic weight records, pt with no significant weight loss. Pt is agreeable to nutritional supplements once diet advances to aid in wound healing. RD to order.  Pt with no observed significant fat or muscle mass loss.   Labs and medications reviewed.   Diet Order:  Diet NPO time specified  Skin:  Wound (see comment) (R great toe diabetic ulcer)  Last BM:  12/28  Height:   Ht Readings from Last 1 Encounters:  08/14/15 6\' 2"  (1.88 m)    Weight:   Wt Readings from Last 1 Encounters:  08/14/15 198 lb (89.812 kg)    Ideal Body Weight:  86.36 kg  BMI:  Body mass index is 25.41 kg/(m^2).  Estimated Nutritional Needs:   Kcal:  [id number]  Protein:  115-130 grams  Fluid:  Per MD  EDUCATION NEEDS:   No education needs identified at this time  Corrin Parker, MS, RD, LDN Pager # [phone] After hours/ weekend pager # [phone]

## 2015-08-15 NOTE — Clinical Social Work Note (Signed)
Clinical Social Worker received referral for access to medications at discharge.  Chart reviewed.  Patient to be evaluated by ortho for possible surgery.  Will speak with RN Case Manager who can follow up with patient to discuss medication needs at discharge.    CSW signing off - please re consult if social work needs arise.  Barbette Or, [place]

## 2015-08-15 NOTE — Progress Notes (Signed)
Called ED nurse for report.

## 2015-08-15 NOTE — Progress Notes (Signed)
Inpatient Diabetes Program Recommendations  AACE/ADA: New Consensus Statement on Inpatient Glycemic Control (2015)  Target Ranges:  Prepandial:   less than 140 mg/dL      Peak postprandial:   less than 180 mg/dL (1-2 hours)      Critically ill patients:  140 - 180 mg/dL   Review of Glycemic Control  Inpatient Diabetes Program Recommendations:   Agree with current orders. A1C pending. Will follow. Thank you  Raoul Pitch BSN, RN,CDE Inpatient Diabetes Coordinator [phone] (team pager)

## 2015-08-15 NOTE — Transfer of Care (Signed)
Immediate Anesthesia Transfer of Care Note  Patient: [name] Alikhan Sr.  Procedure(s) Performed: Procedure(s): Right Great Toe Amputation (Right)  Patient Location: PACU  Anesthesia Type:General  Level of Consciousness: awake, alert , oriented and patient cooperative  Airway & Oxygen Therapy: Patient Spontanous Breathing  Post-op Assessment: Report given to RN and Post -op Vital signs reviewed and stable  Post vital signs: Reviewed and stable  Last Vitals:  Filed Vitals:   08/15/15 0910 08/15/15 1633  BP: 128/53 138/74  Pulse: 84 87  Temp: 36.8 C   Resp: 20 12    Complications: No apparent anesthesia complications

## 2015-08-15 NOTE — Consult Note (Signed)
[facility] Renal Consultation Note    Indication for Consultation:  Management of ESRD/hemodialysis; anemia, hypertension/volume and secondary hyperparathyroidism PCP:  HPI: [name] Labella Sr. is a 39 y.o. male with ESRD, DM, HTN, CHF, calcified but patent coronary arteries per recent cath, EF 35-45% on TTS HD at [facility] who presented pain in his left great toe yesterday which has gotten progressively worse.  [name] has been following.  He has been receiving 2 weeks of Ancef 2 gm q HD -last dose given 12/29 at dialysis as Rx by [name]. He signed off early yesterday due to pain.  He notes a decline in his appetite.  He tends to vomit if he takes a lot of binders at once.  He has no SOB, DOE, CP, cough, N, V, D, fever or chills.  He doesn't make urine. He notes that his BP has been high and since he only takes amlodipine, he wonders if he doesn't need more medication.  Pt's life is also complicated by shared custody of his 31 and 66 year old sons who are spending more time with him because their mother "was kicked out" of where she was living and now lives with a relative.  His significant other is [place], another chronically ill ESRD patient who is also in the hospital at this time and lives with him.  Past Medical History  Diagnosis Date  . Diabetes mellitus   . Renal disorder dialysis    MWF [facility]  . Hypertension    Past Surgical History  Procedure Laterality Date  . Av fistula placement  left arm  . Amputation Left 09/27/2013    Procedure: LEFT GREAT TOE AMPUTATION;  Surgeon: [name], MD;  Location: [place];  Service: Orthopedics;  Laterality: Left;  . Left heart catheterization with coronary angiogram N/A 09/13/2014    Procedure: LEFT HEART CATHETERIZATION WITH CORONARY ANGIOGRAM;  Surgeon: Sinclair Grooms, MD;  Location: [facility] CATH LAB;  Service: Cardiovascular;  Laterality: N/A;   Family History  Problem Relation Age of Onset  . Heart failure  Mother   . Hypertension Mother    Social History: Lives with Clearnce Hasten another ESRD pt. Shares custody of his 80 and 71 year old sons.  The children's mother recently lost her residence and is living with another relative so the boys spend more time with him.  reports that he quit smoking about 10 months ago. His smoking use included Cigarettes. He smoked 0.25 packs per day for 0 years. He has never used smokeless tobacco. He reports that he drinks alcohol. He reports that he uses illicit drugs (Marijuana). Allergies  Allergen Reactions  . Coconut Oil Anaphylaxis    Can use topically,   Prior to Admission medications   Medication Sig Start Date End Date Taking? Authorizing Provider  acetaminophen (TYLENOL) 500 MG tablet Take 1,000 mg by mouth every 6 (six) hours as needed for mild pain.   Yes Historical Provider, MD  aspirin-acetaminophen-caffeine (EXCEDRIN MIGRAINE) [phone] MG per tablet Take 2 tablets by mouth every 6 (six) hours as needed for headache.   Yes Historical Provider, MD  atorvastatin (LIPITOR) 80 MG tablet Take 1 tablet (80 mg total) by mouth daily at 6 PM. 09/15/14  Yes Francesca Oman, DO  calcium acetate (PHOSLO) 667 MG capsule Take 3 capsules (2,001 mg total) by mouth 3 (three) times daily with meals. Patient taking differently: Take 667 mg by mouth 3 (three) times daily with meals.  08/08/13  Yes Velvet Bathe, MD  insulin glargine (LANTUS) 100 UNIT/ML injection Inject 0.1 mLs (10 Units total) into the skin at bedtime. Patient taking differently: Inject 48 Units into the skin at bedtime.  09/15/14  Yes Francesca Oman, DO   Current Facility-Administered Medications  Medication Dose Route Frequency Provider Last Rate Last Dose  . acetaminophen (TYLENOL) tablet 1,000 mg  1,000 mg Oral Q6H PRN Etta Quill, DO   1,000 mg at 08/15/15 [id number]  . aspirin-acetaminophen-caffeine (EXCEDRIN MIGRAINE) per tablet 2 tablet  2 tablet Oral Q6H PRN Etta Quill, DO      . atorvastatin  (LIPITOR) tablet 80 mg  80 mg Oral q1800 Etta Quill, DO      . calcium acetate (PHOSLO) capsule 667 mg  667 mg Oral TID WC Etta Quill, DO   667 mg at 08/15/15 0800  . cefTRIAXone (ROCEPHIN) 2 g in dextrose 5 % 50 mL IVPB  2 g Intravenous Q24H Etta Quill, DO   2 g at 08/15/15 0222   And  . metroNIDAZOLE (FLAGYL) tablet 500 mg  500 mg Oral 3 times per day Etta Quill, DO   500 mg at 08/15/15 0222  . insulin aspart (novoLOG) injection 0-9 Units  0-9 Units Subcutaneous 6 times per day Etta Quill, DO      . insulin glargine (LANTUS) injection 10 Units  10 Units Subcutaneous QHS Etta Quill, DO   10 Units at 08/15/15 [id number]  . oxyCODONE-acetaminophen (PERCOCET/ROXICET) 5-325 MG per tablet 1-2 tablet  1-2 tablet Oral Q4H PRN Jeryl Columbia, NP   2 tablet at 08/15/15 1036  . vancomycin (VANCOCIN) IVPB 1000 mg/200 mL premix  1,000 mg Intravenous Q M,W,F-HD Franky Macho, [facility]       Labs: Basic Metabolic Panel:  Recent Labs Lab 08/14/15 2012  NA 134*  K 3.8  CL 92*  CO2 30  GLUCOSE 146*  BUN 22*  CREATININE 9.08*  CALCIUM 9.2   Liver Function Tests:  Recent Labs Lab 08/14/15 2012  AST 16  ALT <5*  ALKPHOS 53  BILITOT 0.5  PROT 7.2  ALBUMIN 3.1*  CBC:  Recent Labs Lab 08/14/15 2012  WBC 8.1  NEUTROABS 4.9  HGB 8.6*  HCT 27.3*  MCV 83.2  PLT 523*  CBG:  Recent Labs Lab 08/15/15 0136 08/15/15 0404 08/15/15 0754  GLUCAP 109* 186* 97   Studies/Results: Dg Toe Great Right  08/14/2015  CLINICAL DATA:  Medial right great toe pain at the nail bed with draining infection. Diabetes. EXAM: RIGHT GREAT TOE COMPARISON:  None. FINDINGS: Diffuse distal foot soft tissue swelling. There is some focal osteopenia in the medial aspect of the proximal portion of the first distal phalanx. Otherwise, no bone destruction or periosteal reaction. No soft tissue gas. Extensive arterial calcifications. IMPRESSION: 1. Focal osteopenia in the medial aspect of the  proximal portion of the first distal phalanx. This could be an early indication of osteomyelitis. Recommend consideration of an MRI of the foot without and with contrast. 2. Extensive atheromatous arterial calcifications. Electronically Signed   By: Claudie Revering M.D.   On: 08/14/2015 21:03    ROS: As per HPI otherwise negative.  Physical Exam: Filed Vitals:   08/15/15 0100 08/15/15 0144 08/15/15 0407 08/15/15 0910  BP: 173/90 183/80 136/64 128/53  Pulse: 87 87 90 84  Temp:  98.2 F (36.8 C) 98.4 F (36.9 C) 98.3 F (36.8 C)  TempSrc:  Oral Oral  Oral  Resp:  18 20 20   Height:      Weight:      SpO2: 99% 100% 95% 98%     General: Slender pleasant AAM, in no acute distress. Head: Normocephalic, atraumatic, sclera non-icteric, mucus membranes are moist Neck: Supple. Lungs: Clear bilaterally to auscultation without wheezes, rales, or rhonchi. Breathing is unlabored. Heart: RRR with S1 S2. No murmurs, rubs, or gallops appreciated. Abdomen: Soft, non-tender, non-distended with normoactive bowel sounds. No rebound/guarding. No obvious abdominal masses. Lower extremities: no edema; well healed left great toe amp; right great toe medial and plantar ulcer nonhealing Neuro: Alert and oriented X 3. Moves all extremities spontaneously. Psych:  Responds to questions appropriately with a normal affect. Dialysis Access:left upper AVF  Dialysis Orders: [facility] TTS 4 hr 200 500/800 EDW 87.5 2K 2 Ca profile 2 heparin 8000 venofer 50/week no ESA calcitriol 2 left upper AVF, calcitriol 2 venofer 50 Recent labs;  Hgb 9.1 12/29 - trending down from 13 - needs ESA, Ca 9s P down to 6.6 iPTH 328 26% sat 12/15  Assessment/Plan: 1. R great toe diabetic foot ulcer- [name] to see- has failed conservative therapy; prior left great to amp several years ago; s/p 2 weeks of Ancef - last dose 12/29 -no on flagyl, Vanc and Zosyn/pain control 2. ESRD -  TTS - HD tomorrow per routine; challenge  volume 3. Hypertension/volume  - very low gains and no urine outpt, BP runs high - suspect it will improve some with challenged volume- only takes amlodipine- interesting that BP is down now - have resumed for HS 4. Anemia  - Hgb 8.6 - had been stable off ESA - drop likely secondary to infection - resume ESA Saturiday,  5. Metabolic bone disease -  Continue calcitriol and binders- takes 1 phoslo and 2 fosrenol 6. Nutrition - npo for possible surgery then will need renal/carb mod diet/vitamins 7. Intermittent adherence issues with dialysis treatments/BP meds/appropriate diet  Myriam Jacobson, PA-C [place] [phone] 08/15/2015, 11:30 AM   Pt seen, examined and agree w A/P as above.  Kelly Splinter MD [employer] pager [phone]    cell [phone] 08/15/2015, 4:45 PM

## 2015-08-15 NOTE — Progress Notes (Signed)
Utilization review completed. Crystallee Werden, RN, BSN. 

## 2015-08-15 NOTE — Consult Note (Addendum)
WOC consult requested for right foot wound.  Pt has been followed as an outpatient by Dr Sharol Given according to the EMR. X-ray indicates: Focal osteopenia in the medial aspect of the proximal portion of the first distal phalanx; this could be an early indication of Osteomyelitis.  This complex medical condition is beyond [facility] scope of practice.  Please refer to ortho team for further plan of care. Moist gauze dressing ordered for staff nurses until further orders are available from the ortho team. Please re-consult if further assistance is needed.  Thank-you,  Julien Girt MSN, [place], [place], [place], [place]

## 2015-08-15 NOTE — H&P (Signed)
[facility] History and Physical  [name] [id number] DOB: [birth year] DOA: 08/14/2015  Referring physician: EDP PCP: Placido Sou, MD   Chief Complaint: Wound infection   HPI: [name] Labella Sr. is a 39 y.o. male with h/o DM, ESRD dialysis TTS, HTN.  Patient presents to the ED with c/o worsening R great toe diabetic foot ulcer, necrosis, and infection.  Patient has h/o diabetic foot ulcers, had great toe on L foot amputated last year by [name] for same.  18 days ago developed ulcer on R great toe.  Has been seeing [name] in the office and per patient [name] already told him that if toe didn't start looking better that it would likely be time to amputate.  Unfortunately, since that time, toe has continued to look progressively worse per patient.  There is associated purulent drainage and necrotic discoloration.  Review of Systems: Systems reviewed.  As above, otherwise negative  Past Medical History  Diagnosis Date  . Diabetes mellitus   . Renal disorder dialysis    MWF [facility]  . Hypertension    Past Surgical History  Procedure Laterality Date  . Av fistula placement  left arm  . Amputation Left 09/27/2013    Procedure: LEFT GREAT TOE AMPUTATION;  Surgeon: [name], MD;  Location: [place];  Service: Orthopedics;  Laterality: Left;  . Left heart catheterization with coronary angiogram N/A 09/13/2014    Procedure: LEFT HEART CATHETERIZATION WITH CORONARY ANGIOGRAM;  Surgeon: Sinclair Grooms, MD;  Location: [facility] CATH LAB;  Service: Cardiovascular;  Laterality: N/A;   Social History:  reports that he quit smoking about 10 months ago. His smoking use included Cigarettes. He smoked 0.25 packs per day for 0 years. He has never used smokeless tobacco. He reports that he drinks alcohol. He reports that he uses illicit drugs (Marijuana).  Allergies  Allergen Reactions  . Coconut Oil Anaphylaxis    Can use topically,    Family History   Problem Relation Age of Onset  . Heart failure Mother   . Hypertension Mother      Prior to Admission medications   Medication Sig Start Date End Date Taking? Authorizing Provider  acetaminophen (TYLENOL) 500 MG tablet Take 1,000 mg by mouth every 6 (six) hours as needed for mild pain.   Yes Historical Provider, MD  aspirin-acetaminophen-caffeine (EXCEDRIN MIGRAINE) [phone] MG per tablet Take 2 tablets by mouth every 6 (six) hours as needed for headache.   Yes Historical Provider, MD  atorvastatin (LIPITOR) 80 MG tablet Take 1 tablet (80 mg total) by mouth daily at 6 PM. 09/15/14  Yes Francesca Oman, DO  calcium acetate (PHOSLO) 667 MG capsule Take 3 capsules (2,001 mg total) by mouth 3 (three) times daily with meals. Patient taking differently: Take 667 mg by mouth 3 (three) times daily with meals.  08/08/13  Yes Velvet Bathe, MD  insulin glargine (LANTUS) 100 UNIT/ML injection Inject 0.1 mLs (10 Units total) into the skin at bedtime. Patient taking differently: Inject 48 Units into the skin at bedtime.  09/15/14  Yes Francesca Oman, DO   Physical Exam: Filed Vitals:   08/14/15 2330 08/15/15 0100  BP: 189/95 173/90  Pulse: 92 87  Temp:    Resp:      BP 173/90 mmHg  Pulse 87  Temp(Src) 98.4 F (36.9 C) (Oral)  Resp 16  Ht 6\' 2"  (1.88 m)  Wt 89.812 kg (198 lb)  BMI 25.41 kg/m2  SpO2 99%  General Appearance:    Alert, oriented, no distress, appears stated age  Head:    Normocephalic, atraumatic  Eyes:    PERRL, EOMI, sclera non-icteric        Nose:   Nares without drainage or epistaxis. Mucosa, turbinates normal  Throat:   Moist mucous membranes. Oropharynx without erythema or exudate.  Neck:   Supple. No carotid bruits.  No thyromegaly.  No lymphadenopathy.   Back:     No CVA tenderness, no spinal tenderness  Lungs:     Clear to auscultation bilaterally, without wheezes, rhonchi or rales  Chest wall:    No tenderness to palpitation  Heart:    Regular rate and rhythm  without murmurs, gallops, rubs  Abdomen:     Soft, non-tender, nondistended, normal bowel sounds, no organomegaly  Genitalia:    deferred  Rectal:    deferred  Extremities:   Ulceration of plantar and medial surface of great toe, fibrinous debris in wound, surrounding necrosis.  Pulses:   2+ and symmetric all extremities  Skin:   Skin color, texture, turgor normal, no rashes or lesions  Lymph nodes:   Cervical, supraclavicular, and axillary nodes normal  Neurologic:   CNII-XII intact. Normal strength, sensation and reflexes      throughout    Labs on Admission:  Basic Metabolic Panel:  Recent Labs Lab 08/14/15 2012  NA 134*  K 3.8  CL 92*  CO2 30  GLUCOSE 146*  BUN 22*  CREATININE 9.08*  CALCIUM 9.2   Liver Function Tests:  Recent Labs Lab 08/14/15 2012  AST 16  ALT <5*  ALKPHOS 53  BILITOT 0.5  PROT 7.2  ALBUMIN 3.1*   No results for input(s): LIPASE, AMYLASE in the last 168 hours. No results for input(s): AMMONIA in the last 168 hours. CBC:  Recent Labs Lab 08/14/15 2012  WBC 8.1  NEUTROABS 4.9  HGB 8.6*  HCT 27.3*  MCV 83.2  PLT 523*   Cardiac Enzymes: No results for input(s): CKTOTAL, CKMB, CKMBINDEX, TROPONINI in the last 168 hours.  BNP (last 3 results) No results for input(s): PROBNP in the last 8760 hours. CBG: No results for input(s): GLUCAP in the last 168 hours.  Radiological Exams on Admission: Dg Toe Great Right  08/14/2015  CLINICAL DATA:  Medial right great toe pain at the nail bed with draining infection. Diabetes. EXAM: RIGHT GREAT TOE COMPARISON:  None. FINDINGS: Diffuse distal foot soft tissue swelling. There is some focal osteopenia in the medial aspect of the proximal portion of the first distal phalanx. Otherwise, no bone destruction or periosteal reaction. No soft tissue gas. Extensive arterial calcifications. IMPRESSION: 1. Focal osteopenia in the medial aspect of the proximal portion of the first distal phalanx. This could be an  early indication of osteomyelitis. Recommend consideration of an MRI of the foot without and with contrast. 2. Extensive atheromatous arterial calcifications. Electronically Signed   By: Claudie Revering M.D.   On: 08/14/2015 21:03    EKG: Independently reviewed.  Assessment/Plan Active Problems:   Wound infection ([place])   Diabetic ulcer of right great toe ([place])   1. Diabetic ulcer of R great toe - also with suspicion on plain film X ray of possible osteomyelitis. 1. Diabetic foot pathway 2. ABx 3. Call ortho ([name]) in AM 4. Will keep NPO after midnight for possible surgery, but [name] may already be maxed out on cases tomorrow 2. DM1 - 1. Lantus 10 units daily (down  from 15 he normally takes) 2. SSI Q4H 3. ESRD - left message with dialysis consult line for routine dialysis while inpatient    Code Status: Full code  Family Communication: No family in room Disposition Plan: Admit to inpatient   Time spent: 70 min  Nyhla Mountjoy M. [facility] Pager [phone]  If 7AM-7PM, please contact the day team taking care of the patient [email or web address] Password [email or web address] 08/15/2015, 1:12 AM

## 2015-08-15 NOTE — Op Note (Signed)
08/14/2015 - 08/15/2015  4:14 PM  PATIENT:  [name] Labella Sr.    PRE-OPERATIVE DIAGNOSIS:  Osteomyelitis Right Great Toe  POST-OPERATIVE DIAGNOSIS:  Same  PROCEDURE:  Right Great Toe Amputation  SURGEON:  [name], MD  PHYSICIAN ASSISTANT:None ANESTHESIA:   General  PREOPERATIVE INDICATIONS:  [name] Rawling Sr. is a  39 y.o. male with a diagnosis of Osteomyelitis Right Great Toe who failed conservative measures and elected for surgical management.    The risks benefits and alternatives were discussed with the patient preoperatively including but not limited to the risks of infection, bleeding, nerve injury, cardiopulmonary complications, the need for revision surgery, among others, and the patient was willing to proceed.  OPERATIVE IMPLANTS: None  OPERATIVE FINDINGS: Small petechial bleeding no abscess no purulence no necrotic tissue  OPERATIVE PROCEDURE: Patient was brought to the operating room and underwent a general anesthetic. After adequate levels anesthesia obtained patient's right lower extremity was prepped using DuraPrep draped into a sterile field. A timeout was called. A fishmouth incision was just made distal to the MTP joint. The toe was resected through the MTP joint. The wound was irrigated with normal saline. Electrocautery was used for hemostasis. The wound was closed using 2-0 nylon. A sterile compressive dressing was applied. Patient was extubated taken to the PACU in stable condition.

## 2015-08-15 NOTE — Progress Notes (Signed)
ANTIBIOTIC CONSULT NOTE - INITIAL  Pharmacy Consult for Vancomycin Indication: R great toe infection  Allergies  Allergen Reactions  . Coconut Oil Anaphylaxis    Can use topically,    Patient Measurements: Height: 6\' 2"  (188 cm) Weight: 198 lb (89.812 kg) IBW/kg (Calculated) : 82.2  Vital Signs: Temp: 98.4 F (36.9 C) (12/29 1939) Temp Source: Oral (12/29 1939) BP: 173/90 mmHg (12/30 0100) Pulse Rate: 87 (12/30 0100) Intake/Output from previous day:   Intake/Output from this shift:    Labs:  Recent Labs  08/14/15 2012  WBC 8.1  HGB 8.6*  PLT 523*  CREATININE 9.08*   Estimated Creatinine Clearance: 12.8 mL/min (by C-G formula based on Cr of 9.08). No results for input(s): VANCOTROUGH, VANCOPEAK, VANCORANDOM, GENTTROUGH, GENTPEAK, GENTRANDOM, TOBRATROUGH, TOBRAPEAK, TOBRARND, AMIKACINPEAK, AMIKACINTROU, AMIKACIN in the last 72 hours.   Microbiology: No results found for this or any previous visit (from the past 720 hour(s)).  Medical History: Past Medical History  Diagnosis Date  . Diabetes mellitus   . Renal disorder dialysis    MWF [facility]  . Hypertension     Medications:  See electronic med rec  Assessment: 39 y.o. M presents with R great toe wound. Afeb. WBC wnl. To continue Vancomycin, Rocephin, and Flagyl. Pt received Vanc 1gm in ED ~0030. Pt with ESRD - HD M/W/F as o/p.  Goal of Therapy:  Pre-HD vanc level 15-25 mcg/ml  Plan:  Vancomycin 1gm IV in addition to 1gm already given to equal 2gm loading dose. Vancomycin 1gm IV with HD Will f/u HD schedule, micro data, and pt's clinical condition Pre-HD vanc level prn  Sherlon Handing, PharmD, BCPS Clinical pharmacist, pager [phone] 08/15/2015,1:12 AM

## 2015-08-15 NOTE — Progress Notes (Addendum)
VASCULAR LAB PRELIMINARY  PRELIMINARY  PRELIMINARY  PRELIMINARY  VASCULAR LAB PRELIMINARY  ARTERIAL  ABI completed:   ABI on the left suggest  Moderate arterial insufficiency at rest. Left Dorsal Pedis artery is non compressible likely  due to vessel calcification . Non Compressible ABI on the right at rest also likely due to vessel calcification.    RIGHT    LEFT    PRESSURE WAVEFORM  PRESSURE WAVEFORM  BRACHIAL 230 Triphasic BRACHIAL    DP >254 Monophasic DP >254 Monophasic  PT >254 Monophasic PT 149 Monophasic  GREAT TOE ulcer NA GREAT TOE amputated NA    RIGHT LEFT  ABI n/a 0.65     [name], RVT 08/15/2015, 11:31 AM

## 2015-08-15 NOTE — Progress Notes (Signed)
[facility] PROGRESS NOTE  [name] Edmison Sr. [id number] DOB: 1976-03-29 DOA: 08/14/2015 PCP: Placido Sou, MD  Brief Summary  [name] Labella Sr. is a 39 y.o. male with h/o DM, ESRD dialysis TTS, HTN. Patient presents to the ED with c/o worsening R great toe diabetic foot ulcer, necrosis, and infection. Patient has h/o diabetic foot ulcers, had great toe on L foot amputated last year by [name] for same. 18 days ago developed ulcer on R great toe. Has been seeing [name] in the office and per patient [name] already told him that if toe didn't start looking better that it would likely be time to amputate. Since that time, toe has continued to look progressively purulent drainage and necrotic.  Assessment/Plan  Diabetic ulcer of the right great toe with possible osteomyelitis -  Dr. Jess Barters assistance greatly appreciated -  ABIs completed:  Right and left with monophasic flow at DP/PT.  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 -  Continue lantus 10 units -  Start dextrose fluids to prevent hypoglycemia since ABG 79 at lunch -  D/c dextrose fluids post-procedure and resume diet -  Change to SSI AC once diet resumed  ESRD THSa, Nephrology following for HD  Hypertension, labile blood pressures -  Continue norvasc  Grade 2 DD with preserved EF by ECHO but EF 35-40% per heart catheterization 08/2014  Anemia of renal disease -  Per nephrology  Diet:  Renal/diabetic Access:  PIV IVF:  off Proph:  heparin  Code Status: full Family Communication: patient alone Disposition Plan:  In 1-2 days to home depending on progression   Consultants:  [name], orthopedic  surgery  Nephrology  Procedures:  Amputation of right great toe on 12/30 by [name]  Antibiotics:  Vancomycin, ceftriaxone, and flagyl   HPI/Subjective:  Frustrated that he was given insulin this morning and concerned that his blood sugars may drop.  Concerned that his toe is going to be amputated.  Had some left lateral chest wall pain this morning that went away, not associated with SOB, nausea, lightheadedness.      Objective: Filed Vitals:   08/15/15 1645 08/15/15 1700 08/15/15 1715 08/15/15 1730  BP: 181/91 187/95    Pulse: 88 89 89   Temp:    98 F (36.7 C)  TempSrc:      Resp: 13 19 23    Height:      Weight:      SpO2: 100% 98% 97%     Intake/Output Summary (Last 24 hours) at 08/15/15 1739 Last data filed at 08/15/15 1638  Gross per 24 hour  Intake    750 ml  Output     50 ml  Net    700 ml   Filed Weights   08/14/15 1939  Weight: 89.812 kg (198 lb)   Body mass index is 25.41 kg/(m^2).  Exam:   General:  Adult male, No acute distress  HEENT:  NCAT, MMM  Cardiovascular:  RRR, nl S1, S2, 2/6 systolic murmur, warm extremities  Respiratory:  CTAB, no increased WOB  Abdomen:   NABS, soft, NT/ND  MSK:   Normal tone and bulk, no LEE, faint/barely palpable pedal pulses.  Right great toe dusky and swollen and very painful  to light palpation  Data Reviewed: Basic Metabolic Panel:  Recent Labs Lab 08/14/15 2012 08/15/15 1508  NA 134* 135  K 3.8 3.7  CL 92*  --   CO2 30  --   GLUCOSE 146* 82  BUN 22*  --   CREATININE 9.08*  --   CALCIUM 9.2  --    Liver Function Tests:  Recent Labs Lab 08/14/15 2012  AST 16  ALT <5*  ALKPHOS 53  BILITOT 0.5  PROT 7.2  ALBUMIN 3.1*   No results for input(s): LIPASE, AMYLASE in the last 168 hours. No results for input(s): AMMONIA in the last 168 hours. CBC:  Recent Labs Lab 08/14/15 2012 08/15/15 1508  WBC 8.1  --   NEUTROABS 4.9  --   HGB 8.6* 9.2*  HCT 27.3* 27.0*  MCV 83.2  --   PLT 523*   --     Recent Results (from the past 240 hour(s))  MRSA PCR Screening     Status: None   Collection Time: 08/15/15  1:46 AM  Result Value Ref Range Status   MRSA by PCR NEGATIVE NEGATIVE Final    Comment:        The GeneXpert MRSA Assay (FDA approved for NASAL specimens only), is one component of a comprehensive MRSA colonization surveillance program. It is not intended to diagnose MRSA infection nor to guide or monitor treatment for MRSA infections.      Studies: Dg Toe Great Right  08/14/2015  CLINICAL DATA:  Medial right great toe pain at the nail bed with draining infection. Diabetes. EXAM: RIGHT GREAT TOE COMPARISON:  None. FINDINGS: Diffuse distal foot soft tissue swelling. There is some focal osteopenia in the medial aspect of the proximal portion of the first distal phalanx. Otherwise, no bone destruction or periosteal reaction. No soft tissue gas. Extensive arterial calcifications. IMPRESSION: 1. Focal osteopenia in the medial aspect of the proximal portion of the first distal phalanx. This could be an early indication of osteomyelitis. Recommend consideration of an MRI of the foot without and with contrast. 2. Extensive atheromatous arterial calcifications. Electronically Signed   By: Claudie Revering M.D.   On: 08/14/2015 21:03    Scheduled Meds: . [MAR Hold] amLODipine  10 mg Oral QHS  . [MAR Hold] atorvastatin  80 mg Oral q1800  . [MAR Hold] calcitRIOL  2 mcg Oral Q T,Th,Sa-HD  . [MAR Hold] calcium acetate  667 mg Oral TID WC  . [MAR Hold] cefTRIAXone (ROCEPHIN)  IV  2 g Intravenous Q24H   And  . [MAR Hold] metroNIDAZOLE  500 mg Oral 3 times per day  . [MAR Hold] darbepoetin (ARANESP) injection - DIALYSIS  60 mcg Intravenous Q Sat-HD  . [MAR Hold] feeding supplement (NEPRO CARB STEADY)  237 mL Oral BID BM  . fentaNYL      . [MAR Hold] ferric gluconate (FERRLECIT/NULECIT) IV  62.5 mg Intravenous Q Thu-HD  . [MAR Hold] insulin aspart  0-9 Units Subcutaneous 6 times per  day  . [MAR Hold] insulin glargine  10 Units Subcutaneous QHS  . [MAR Hold] lanthanum  2,000 mg Oral TID WC  . [MAR Hold] multivitamin  1 tablet Oral QHS  . oxyCODONE-acetaminophen      . [MAR Hold] vancomycin  1,000 mg Intravenous Q T,Th,Sa-HD   Continuous Infusions: . sodium chloride 10 mL/hr at 08/15/15 1506  . sodium chloride    . dextrose 5 % and 0.45 % NaCl with KCl 10 mEq/L Stopped (08/15/15 1509)  Active Problems:   Wound infection ([place])   Diabetic ulcer of right great toe ([place])    Time spent: 30 min    Gaven Eugene, [facility] Pager [phone]. If 7PM-7AM, please contact night-coverage at [email or web address], password [facility] 08/15/2015, 5:39 PM  LOS: 0 days

## 2015-08-15 NOTE — Consult Note (Signed)
Reason for Consult: Osteomyelitis abscess left great toe Referring Physician: Dr. [name] is an 39 y.o. male.  HPI: Patient is a 39 year old gentleman who is been seen by myself in the office. Patient is undergone wound care therapy as well as topical and oral antibiotics for the right great toe. Patient presents with increasing pain in drainage odor and ulceration to the right great toe.  Past Medical History  Diagnosis Date  . Diabetes mellitus   . Renal disorder dialysis    MWF [employer] [employer]  . Hypertension     Past Surgical History  Procedure Laterality Date  . Av fistula placement  left arm  . Amputation Left 09/27/2013    Procedure: LEFT GREAT TOE AMPUTATION;  Surgeon: [name] Minion, MD;  Location: [facility];  Service: Orthopedics;  Laterality: Left;  . Left heart catheterization with coronary angiogram N/A 09/13/2014    Procedure: LEFT HEART CATHETERIZATION WITH CORONARY ANGIOGRAM;  Surgeon: [name] Grooms, MD;  Location: [facility] CATH [facility];  Service: Cardiovascular;  Laterality: N/A;    Family History  Problem Relation Age of Onset  . Heart failure Mother   . Hypertension Mother     Social History:  reports that he quit smoking about 10 months ago. His smoking use included Cigarettes. He smoked 0.25 packs per day for 0 years. He has never used smokeless tobacco. He reports that he drinks alcohol. He reports that he uses illicit drugs (Marijuana).  Allergies:  Allergies  Allergen Reactions  . Coconut Oil Anaphylaxis    Can use topically,    Medications: I have reviewed the patient's current medications.  Results for orders placed or performed during the hospital encounter of 08/14/15 (from the past 48 hour(s))  CBC with Differential     Status: Abnormal   Collection Time: 08/14/15  8:12 PM  Result Value Ref Range   WBC 8.1 4.0 - 10.5 K/uL   RBC 3.28 (L) 4.22 - 5.81 MIL/uL   Hemoglobin 8.6 (L) 13.0 - 17.0 g/dL   HCT 27.3 (L) 39.0 - 52.0 %   MCV 83.2  78.0 - 100.0 fL   MCH 26.2 26.0 - 34.0 pg   MCHC 31.5 30.0 - 36.0 g/dL   RDW 16.1 (H) 11.5 - 15.5 %   Platelets 523 (H) 150 - 400 K/uL   Neutrophils Relative % 60 %   Neutro Abs 4.9 1.7 - 7.7 K/uL   Lymphocytes Relative 29 %   Lymphs Abs 2.3 0.7 - 4.0 K/uL   Monocytes Relative 7 %   Monocytes Absolute 0.6 0.1 - 1.0 K/uL   Eosinophils Relative 3 %   Eosinophils Absolute 0.3 0.0 - 0.7 K/uL   Basophils Relative 1 %   Basophils Absolute 0.0 0.0 - 0.1 K/uL  Comprehensive metabolic panel     Status: Abnormal   Collection Time: 08/14/15  8:12 PM  Result Value Ref Range   Sodium 134 (L) 135 - 145 mmol/L   Potassium 3.8 3.5 - 5.1 mmol/L   Chloride 92 (L) 101 - 111 mmol/L   CO2 30 22 - 32 mmol/L   Glucose, Bld 146 (H) 65 - 99 mg/dL   BUN 22 (H) 6 - 20 mg/dL   Creatinine, Ser 9.08 (H) 0.61 - 1.24 mg/dL   Calcium 9.2 8.9 - 10.3 mg/dL   Total Protein 7.2 6.5 - 8.1 g/dL   Albumin 3.1 (L) 3.5 - 5.0 g/dL   AST 16 15 - 41 U/L  ALT <5 (L) 17 - 63 U/L   Alkaline Phosphatase 53 38 - 126 U/L   Total Bilirubin 0.5 0.3 - 1.2 mg/dL   GFR calc non Af Amer 7 (L) >60 mL/min   GFR calc Af Amer 8 (L) >60 mL/min    Comment: (NOTE) The eGFR has been calculated using the CKD EPI equation. This calculation has not been validated in all clinical situations. eGFR's persistently <60 mL/min signify possible Chronic Kidney Disease.    Anion gap 12 5 - 15  Glucose, capillary     Status: Abnormal   Collection Time: 08/15/15  1:36 AM  Result Value Ref Range   Glucose-Capillary 109 (H) 65 - 99 mg/dL  MRSA PCR Screening     Status: None   Collection Time: 08/15/15  1:46 AM  Result Value Ref Range   MRSA by PCR NEGATIVE NEGATIVE    Comment:        The GeneXpert MRSA Assay (FDA approved for NASAL specimens only), is one component of a comprehensive MRSA colonization surveillance program. It is not intended to diagnose MRSA infection nor to guide or monitor treatment for MRSA infections.    Sedimentation rate     Status: Abnormal   Collection Time: 08/15/15  3:00 AM  Result Value Ref Range   Sed Rate 94 (H) 0 - 16 mm/hr  C-reactive protein     Status: Abnormal   Collection Time: 08/15/15  3:00 AM  Result Value Ref Range   CRP 5.2 (H) <1.0 mg/dL  Prealbumin     Status: None   Collection Time: 08/15/15  3:00 AM  Result Value Ref Range   Prealbumin 21.8 18 - 38 mg/dL  Glucose, capillary     Status: Abnormal   Collection Time: 08/15/15  4:04 AM  Result Value Ref Range   Glucose-Capillary 186 (H) 65 - 99 mg/dL  Glucose, capillary     Status: None   Collection Time: 08/15/15  7:54 AM  Result Value Ref Range   Glucose-Capillary 97 65 - 99 mg/dL   Comment 1 Document in Chart   Glucose, capillary     Status: None   Collection Time: 08/15/15 11:50 AM  Result Value Ref Range   Glucose-Capillary 79 65 - 99 mg/dL   Comment 1 Document in Chart     Dg Toe Great Right  08/14/2015  CLINICAL DATA:  Medial right great toe pain at the nail bed with draining infection. Diabetes. EXAM: RIGHT GREAT TOE COMPARISON:  None. FINDINGS: Diffuse distal foot soft tissue swelling. There is some focal osteopenia in the medial aspect of the proximal portion of the first distal phalanx. Otherwise, no bone destruction or periosteal reaction. No soft tissue gas. Extensive arterial calcifications. IMPRESSION: 1. Focal osteopenia in the medial aspect of the proximal portion of the first distal phalanx. This could be an early indication of osteomyelitis. Recommend consideration of an MRI of the foot without and with contrast. 2. Extensive atheromatous arterial calcifications. Electronically Signed   By: [name] M.D.   On: 08/14/2015 21:03    Review of Systems  All other systems reviewed and are negative.  Blood pressure 128/53, pulse 84, temperature 98.3 F (36.8 C), temperature source Oral, resp. rate 20, height 6' 2" (1.88 m), weight 89.812 kg (198 lb), SpO2 98 %. Physical Exam On examination  patient has no ascending cellulitis. He has drainage and ulceration and purulence of the right great toe. He does not have a palpable pulse but he has  healed amputation of the left great toe and does have good capillary refill. Assessment/Plan: Assessment: Ulceration osteomyelitis abscess right great toe. With diabetic insensate neuropathy.  Plan: We will plan for a right great toe amputation through the MTP joint. Risk and benefits were discussed patient states he understands wish to proceed at this time.  [name],[name] 08/15/2015, 1:20 PM

## 2015-08-15 NOTE — Progress Notes (Signed)
New Admission Note  Arrival: Via stretcher Mental Orientation: Alert & oriented x4 Telemetry: non tele Skin: Right toe diabetic ulcer & left toe laceration diabetic ulcer verified by 2nd RN Becca IV: Right hand IV Pain: C/o of pain. Meds given. Safety measures:  verbalized understanding. Bed in lowest position. Non-skid socks on.  Family: No family at bedside. Orders have been reviewed and implemented. Will continue to monitor.

## 2015-08-15 NOTE — Anesthesia Preprocedure Evaluation (Signed)
Anesthesia Evaluation  Patient identified by MRN, date of birth, ID band Patient awake    Reviewed: Allergy & Precautions, NPO status , Patient's Chart, lab work & pertinent test results  Airway Mallampati: II  TM Distance: >3 FB Neck ROM: Full    Dental no notable dental hx.    Pulmonary neg pulmonary ROS, former smoker,    Pulmonary exam normal breath sounds clear to auscultation       Cardiovascular hypertension, Pt. on medications Normal cardiovascular exam Rhythm:Regular Rate:Normal     Neuro/Psych negative neurological ROS  negative psych ROS   GI/Hepatic negative GI ROS, Neg liver ROS,   Endo/Other  diabetes, Insulin Dependent  Renal/GU DialysisRenal disease  negative genitourinary   Musculoskeletal negative musculoskeletal ROS (+)   Abdominal   Peds negative pediatric ROS (+)  Hematology negative hematology ROS (+)   Anesthesia Other Findings   Reproductive/Obstetrics negative OB ROS                             Anesthesia Physical Anesthesia Plan  ASA: III  Anesthesia Plan: General   Post-op Pain Management:    Induction: Intravenous  Airway Management Planned: LMA  Additional Equipment:   Intra-op Plan:   Post-operative Plan: Extubation in OR  Informed Consent: I have reviewed the patients History and Physical, chart, labs and discussed the procedure including the risks, benefits and alternatives for the proposed anesthesia with the patient or authorized representative who has indicated his/her understanding and acceptance.   Dental advisory given  Plan Discussed with: CRNA and Surgeon  Anesthesia Plan Comments:         Anesthesia Quick Evaluation

## 2015-08-16 DIAGNOSIS — D631 Anemia in chronic kidney disease: Secondary | ICD-10-CM

## 2015-08-16 DIAGNOSIS — T148 Other injury of unspecified body region: Secondary | ICD-10-CM

## 2015-08-16 DIAGNOSIS — L97519 Non-pressure chronic ulcer of other part of right foot with unspecified severity: Secondary | ICD-10-CM

## 2015-08-16 DIAGNOSIS — E11621 Type 2 diabetes mellitus with foot ulcer: Secondary | ICD-10-CM

## 2015-08-16 DIAGNOSIS — N189 Chronic kidney disease, unspecified: Secondary | ICD-10-CM

## 2015-08-16 DIAGNOSIS — L089 Local infection of the skin and subcutaneous tissue, unspecified: Secondary | ICD-10-CM

## 2015-08-16 LAB — RENAL FUNCTION PANEL
Albumin: 2.7 g/dL — ABNORMAL LOW (ref 3.5–5.0)
Anion gap: 14 (ref 5–15)
BUN: 38 mg/dL — ABNORMAL HIGH (ref 6–20)
CO2: 28 mmol/L (ref 22–32)
Calcium: 9.1 mg/dL (ref 8.9–10.3)
Chloride: 93 mmol/L — ABNORMAL LOW (ref 101–111)
Creatinine, Ser: 13.29 mg/dL — ABNORMAL HIGH (ref 0.61–1.24)
GFR calc Af Amer: 5 mL/min — ABNORMAL LOW (ref >60)
GFR calc non Af Amer: 4 mL/min — ABNORMAL LOW (ref >60)
Glucose, Bld: 86 mg/dL (ref 65–99)
Phosphorus: 7.3 mg/dL — ABNORMAL HIGH (ref 2.5–4.6)
Potassium: 4 mmol/L (ref 3.5–5.1)
Sodium: 135 mmol/L (ref 135–145)

## 2015-08-16 LAB — CBC
HCT: 25 % — ABNORMAL LOW (ref 39.0–52.0)
Hemoglobin: 7.9 g/dL — ABNORMAL LOW (ref 13.0–17.0)
MCH: 25.9 pg — ABNORMAL LOW (ref 26.0–34.0)
MCHC: 31.6 g/dL (ref 30.0–36.0)
MCV: 82 fL (ref 78.0–100.0)
Platelets: 519 10*3/uL — ABNORMAL HIGH (ref 150–400)
RBC: 3.05 MIL/uL — ABNORMAL LOW (ref 4.22–5.81)
RDW: 16.3 % — ABNORMAL HIGH (ref 11.5–15.5)
WBC: 11 10*3/uL — ABNORMAL HIGH (ref 4.0–10.5)

## 2015-08-16 LAB — GLUCOSE, CAPILLARY
GLUCOSE-CAPILLARY: 59 mg/dL — AB (ref 65–99)
Glucose-Capillary: 89 mg/dL (ref 65–99)
Glucose-Capillary: 71 mg/dL (ref 65–99)
Glucose-Capillary: 154 mg/dL — ABNORMAL HIGH (ref 65–99)

## 2015-08-16 LAB — HEMOGLOBIN A1C
HEMOGLOBIN A1C: 6.1 % — AB (ref 4.8–5.6)
Mean Plasma Glucose: 128 mg/dL

## 2015-08-16 MED ORDER — SENNOSIDES-DOCUSATE SODIUM 8.6-50 MG PO TABS: 2.0000 | ORAL_TABLET | Freq: Two times a day (BID) | ORAL | Status: DC | PRN

## 2015-08-16 MED ORDER — ALTEPLASE 2 MG IJ SOLR: 2.0000 mg | Freq: Once | INTRAMUSCULAR | Status: DC | PRN

## 2015-08-16 MED ORDER — LANTHANUM CARBONATE 1000 MG PO CHEW: 2000.0000 mg | CHEWABLE_TABLET | Freq: Three times a day (TID) | ORAL | Status: DC

## 2015-08-16 MED ORDER — LIDOCAINE-PRILOCAINE 2.5-2.5 % EX CREA: 1.0000 "application " | TOPICAL_CREAM | CUTANEOUS | Status: DC | PRN

## 2015-08-16 MED ORDER — PENTAFLUOROPROP-TETRAFLUOROETH EX AERO: 1.0000 "application " | INHALATION_SPRAY | CUTANEOUS | Status: DC | PRN

## 2015-08-16 MED ORDER — HEPARIN SODIUM (PORCINE) 1000 UNIT/ML DIALYSIS: 1000.0000 [IU] | INTRAMUSCULAR | Status: DC | PRN

## 2015-08-16 MED ORDER — AMLODIPINE BESYLATE 10 MG PO TABS: 10.0000 mg | ORAL_TABLET | Freq: Every day | ORAL | Status: DC

## 2015-08-16 MED ORDER — SODIUM CHLORIDE 0.9 % IV SOLN: 100.0000 mL | INTRAVENOUS | Status: DC | PRN

## 2015-08-16 MED ORDER — DARBEPOETIN ALFA 60 MCG/0.3ML IJ SOSY
PREFILLED_SYRINGE | INTRAMUSCULAR | Status: AC
  Administered 2015-08-16: 60 ug via INTRAVENOUS
  Filled 2015-08-16: qty 0.3

## 2015-08-16 MED ORDER — CALCIUM ACETATE (PHOS BINDER) 667 MG PO CAPS: 667.0000 mg | ORAL_CAPSULE | Freq: Three times a day (TID) | ORAL | Status: DC

## 2015-08-16 MED ORDER — LIDOCAINE HCL (PF) 1 % IJ SOLN: 5.0000 mL | INTRAMUSCULAR | Status: DC | PRN

## 2015-08-16 MED ORDER — GLUCAGON (RDNA) 1 MG IJ KIT: 1.0000 mg | PACK | Freq: Once | INTRAMUSCULAR | Status: DC | PRN

## 2015-08-16 MED ORDER — GLUCOSE 4 G PO CHEW: 1.0000 | CHEWABLE_TABLET | ORAL | Status: DC | PRN

## 2015-08-16 MED ORDER — OXYCODONE HCL 5 MG PO TABS
ORAL_TABLET | ORAL | Status: AC
  Administered 2015-08-16: 5 mg via ORAL
  Filled 2015-08-16: qty 2

## 2015-08-16 MED ORDER — OXYCODONE HCL 5 MG PO TABS: 5.0000 mg | ORAL_TABLET | ORAL | Status: DC | PRN

## 2015-08-16 NOTE — Plan of Care (Signed)
Problem: Pain Managment: Goal: General experience of comfort will improve Outcome: Progressing Will be d/c'd on prn pain medications.  Problem: Activity: Goal: Risk for activity intolerance will decrease Outcome: Progressing Obtained Darco boot from ortho tech; patient is able to bear weight on heel of right foot now.   Problem: Nutrition: Goal: Adequate nutrition will be maintained Outcome: Completed/Met Date Met:  08/16/15 Excellent appetite.

## 2015-08-16 NOTE — Progress Notes (Signed)
Hemodialysis= Tolerated well. Some cramping during the last hour and unable to uf. Pt signed off AMA with 29 minutes remaining d/t cramps. Able to receive all antibiotics. UF 2.2L total. Report called to primary RN

## 2015-08-16 NOTE — Discharge Summary (Addendum)
Physician Discharge Summary  [name] [place] Sr. [id number] DOB: 12/16/1975 DOA: 08/14/2015  PCP: Placido Sou, MD  Admit date: 08/14/2015 Discharge date: 08/16/2015  Recommendations for Outpatient Follow-up:  1.  F/u with orthopedic surgery, [name], in 2 weeks or sooner as needed. 2.  May put weight on heel while wearing a Darco shoe  Discharge Diagnoses:  Principal Problem:   Diabetic ulcer of right great toe [facility]) Active Problems:   Diabetic foot ulcer with osteomyelitis ([place])   ESRD on dialysis [facility])   Essential hypertension   Wound infection ([place])   Anemia of renal disease   Discharge Condition: stable, improved  Diet recommendation: diabetic  Wt Readings from Last 3 Encounters:  08/16/15 91.3 kg (201 lb 4.5 oz)  06/10/15 92 kg (202 lb 13.2 oz)  10/04/14 83.825 kg (184 lb 12.8 oz)    History of present illness:    [name] Guzzetta Sr. is a 39 y.o. male with h/o DM, ESRD dialysis TTS, HTN. Patient presents to the ED with c/o worsening R great toe diabetic foot ulcer, necrosis, and infection. Patient has h/o diabetic foot ulcers, had great toe on L foot amputated last year by [name] for same. 18 days ago developed ulcer on R great toe. Has been seeing [name] in the office and per patient [name] already told him that if toe didn't start looking better that it would likely be time to amputate. Since that time, toe has continued to look progressively purulent drainage and necrotic.  Hospital Course:   Diabetic ulcer of the right great toe with possible osteomyelitis.  Dr. Jess Barters assistance greatly appreciated.  ABIs were completed: Right and left with monophasic flow at DP/PT. Right N/A 2/2 ulcer, left 0.65.  He was started on empiric antibiotics and underwent amputation of the right great toe by Dr. Meridee Score on [id number] without complication.  He may wear a Darco boot to bear weight on his heel until follow up at his already scheduled  appointment on Jan 5th.  Antibiotics were stopped after surgery due to lack of cellulitis and purulence.  Peripheral arterial disease secondary to diabetes.  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  A1c 6.1.  Advised to reduce his lantus to 8 units and check his blood sugar four times daily since he is at risk for hypoglycemia.  Given Rx for glucose tabs and glucagon.  Expect that his CBGs will gradually rise as he starts to eat his regular foods and if his CBGs are consistently > 200, he should increase to 50% of his home dose of lantus and if CBGs are still > 200, increase to home dose of insulin.  He should ALWAYS take at least some long acting insulin.    ESRD THSa, Nephrology assisted with HD, last dialysis on 12/31.  Hypertension, labile blood pressures and very high, but also in considerable pain post-procedure, continued norvasc.  Pain control.    Grade 2 DD with preserved EF by ECHO but EF 35-40% per heart catheterization 08/2014  Anemia of renal disease and iron deficiency.  Given iron/EPO with dialysis.    Consultants:  [name], orthopedic surgery  Nephrology  Procedures:  Amputation of right great toe on 12/30 by [name]  Antibiotics:  Vancomycin, ceftriaxone, and flagyl  Discharge Exam: Filed Vitals:   08/16/15 1330 08/16/15 1353  BP: 192/60 168/55  Pulse: 95 94  Temp:    Resp:  Filed Vitals:   08/16/15 1247 08/16/15 1255 08/16/15 1330 08/16/15 1353  BP: 170/84 188/75 192/60 168/55  Pulse: 94 95 95 94  Temp: 98.5 F (36.9 C)     TempSrc: Oral     Resp: 16     Height:      Weight: 91.3 kg (201 lb 4.5 oz)     SpO2: 96%        General: Adult male, No acute distress  HEENT: NCAT, MMM  Cardiovascular: RRR, nl S1, S2, 2/6 systolic murmur, warm extremities  Respiratory: CTAB, no increased WOB  Abdomen: NABS, soft, NT/ND  MSK: Normal tone and bulk, no LEE,  faint/barely palpable pedal pulses. Right foot bandaged, able to wiggle toes, < 2 sec CR  Discharge Instructions      Discharge Instructions    Call MD for:  difficulty breathing, headache or visual disturbances    Complete by:  As directed      Call MD for:  extreme fatigue    Complete by:  As directed      Call MD for:  hives    Complete by:  As directed      Call MD for:  persistant dizziness or light-headedness    Complete by:  As directed      Call MD for:  persistant nausea and vomiting    Complete by:  As directed      Call MD for:  redness, tenderness, or signs of infection (pain, swelling, redness, odor or green/yellow discharge around incision site)    Complete by:  As directed      Call MD for:  severe uncontrolled pain    Complete by:  As directed      Call MD for:  temperature >100.4    Complete by:  As directed      Diet Carb Modified    Complete by:  As directed      Discharge instructions    Complete by:  As directed   Please wear your shoe and only put weight on your heel.  You may use tylenol with oxycodone for breakthrough pain.  If you continue to have pain after several days, please talk to your doctor about trying additional medication for nerve pain or discuss with [name] at your next visit.  For your diabetes, please decrease your lantus to 8 units when you get home and check your blood sugar 4 times per day to check for low blood sugars.  Please use glucose tabs or drink 4oz of juice if you have low blood sugar.  I have also given you an emergency glucagon pen for you or someone you're with to inject if you are in a coma from low blood sugar or unable to eat or drink anything.  Your blood sugars will likely start to come up over the next few days.  If they are consistently greater than 200, increase to 10 units of lantus.  If despite making this increase, they are still greater than 200 for a day, increase to 13 units.  Please find a new primary care doctor to  assist with your diabetes management.     Increase activity slowly    Complete by:  As directed   Please wear darco shoe and only put weight on your heel     Leave dressing on - Keep it clean, dry, and intact until clinic visit    Complete by:  As directed  Medication List    TAKE these medications        acetaminophen 500 MG tablet  Commonly known as:  TYLENOL  Take 1,000 mg by mouth every 6 (six) hours as needed for mild pain.     amLODipine 10 MG tablet  Commonly known as:  NORVASC  Take 1 tablet (10 mg total) by mouth at bedtime.     aspirin-acetaminophen-caffeine 250-250-65 MG tablet  Commonly known as:  EXCEDRIN MIGRAINE  Take 2 tablets by mouth every 6 (six) hours as needed for headache.     atorvastatin 80 MG tablet  Commonly known as:  LIPITOR  Take 1 tablet (80 mg total) by mouth daily at 6 PM.     calcium acetate 667 MG capsule  Commonly known as:  PHOSLO  Take 1 capsule (667 mg total) by mouth 3 (three) times daily with meals.     glucagon 1 MG injection  Commonly known as:  GLUCAGON EMERGENCY  Inject 1 mg into the vein once as needed (low blood sugar and inability to eat or drink sugar).     glucose 4 GM chewable tablet  Chew 1 tablet (4 g total) by mouth as needed for low blood sugar.     insulin glargine 100 UNIT/ML injection  Commonly known as:  LANTUS  Inject 0.1 mLs (10 Units total) into the skin at bedtime.     lanthanum 1000 MG chewable tablet  Commonly known as:  FOSRENOL  Chew 2 tablets (2,000 mg total) by mouth 3 (three) times daily with meals.     oxyCODONE 5 MG immediate release tablet  Commonly known as:  Oxy IR/ROXICODONE  Take 1-2 tablets (5-10 mg total) by mouth every 4 (four) hours as needed for breakthrough pain.     senna-docusate 8.6-50 MG tablet  Commonly known as:  Senokot-S  Take 2 tablets by mouth 2 (two) times daily as needed for mild constipation.       Follow-up Information    Follow up with DUDA,MARCUS V,  MD In 2 weeks.   Specialty:  Orthopedic Surgery   Contact information:   [place] [address] [phone]       Follow up with DETERDING,JAMES L, MD. Schedule an appointment as soon as possible for a visit in 1 month.   Specialty:  Nephrology   Why:  As needed   Contact information:   [place] [address] [phone]        The results of significant diagnostics from this hospitalization (including imaging, microbiology, ancillary and laboratory) are listed below for reference.    Significant Diagnostic Studies: Dg Toe Great Right  08/14/2015  CLINICAL DATA:  Medial right great toe pain at the nail bed with draining infection. Diabetes. EXAM: RIGHT GREAT TOE COMPARISON:  None. FINDINGS: Diffuse distal foot soft tissue swelling. There is some focal osteopenia in the medial aspect of the proximal portion of the first distal phalanx. Otherwise, no bone destruction or periosteal reaction. No soft tissue gas. Extensive arterial calcifications. IMPRESSION: 1. Focal osteopenia in the medial aspect of the proximal portion of the first distal phalanx. This could be an early indication of osteomyelitis. Recommend consideration of an MRI of the foot without and with contrast. 2. Extensive atheromatous arterial calcifications. Electronically Signed   By: Claudie Revering M.D.   On: 08/14/2015 21:03    Microbiology: Recent Results (from the past 240 hour(s))  Blood culture (routine x 2)     Status:  None (Preliminary result)   Collection Time: 08/14/15 11:50 PM  Result Value Ref Range Status   Specimen Description BLOOD RIGHT HAND  Final   Special Requests IN PEDIATRIC BOTTLE 3CC  Final   Culture NO GROWTH 1 DAY  Final   Report Status PENDING  Incomplete  Blood culture (routine x 2)     Status: None (Preliminary result)   Collection Time: 08/15/15 12:00 AM  Result Value Ref Range Status   Specimen Description BLOOD RIGHT HAND  Final   Special Requests  BOTTLES DRAWN AEROBIC AND ANAEROBIC 5CC  Final   Culture NO GROWTH 1 DAY  Final   Report Status PENDING  Incomplete  MRSA PCR Screening     Status: None   Collection Time: 08/15/15  1:46 AM  Result Value Ref Range Status   MRSA by PCR NEGATIVE NEGATIVE Final    Comment:        The GeneXpert MRSA Assay (FDA approved for NASAL specimens only), is one component of a comprehensive MRSA colonization surveillance program. It is not intended to diagnose MRSA infection nor to guide or monitor treatment for MRSA infections.      Labs: Basic Metabolic Panel:  Recent Labs Lab 08/14/15 2012 08/15/15 1508 08/16/15 1258  NA 134* 135 135  K 3.8 3.7 4.0  CL 92*  --  93*  CO2 30  --  28  GLUCOSE 146* 82 86  BUN 22*  --  38*  CREATININE 9.08*  --  13.29*  CALCIUM 9.2  --  9.1  PHOS  --   --  7.3*   Liver Function Tests:  Recent Labs Lab 08/14/15 2012 08/16/15 1258  AST 16  --   ALT <5*  --   ALKPHOS 53  --   BILITOT 0.5  --   PROT 7.2  --   ALBUMIN 3.1* 2.7*   No results for input(s): LIPASE, AMYLASE in the last 168 hours. No results for input(s): AMMONIA in the last 168 hours. CBC:  Recent Labs Lab 08/14/15 2012 08/15/15 1508 08/16/15 1258  WBC 8.1  --  11.0*  NEUTROABS 4.9  --   --   HGB 8.6* 9.2* 7.9*  HCT 27.3* 27.0* 25.0*  MCV 83.2  --  82.0  PLT 523*  --  519*   Cardiac Enzymes: No results for input(s): CKTOTAL, CKMB, CKMBINDEX, TROPONINI in the last 168 hours. BNP: BNP (last 3 results)  Recent Labs  09/18/14 0230  BNP 1681.3*    ProBNP (last 3 results) No results for input(s): PROBNP in the last 8760 hours.  CBG:  Recent Labs Lab 08/15/15 1805 08/15/15 2200 08/16/15 0745 08/16/15 0836 08/16/15 1152  GLUCAP 134* 155* 59* 89 71    Time coordinating discharge: 35 minutes  Signed:  Aldwin [name]  [facility] 08/16/2015, 2:16 PM

## 2015-08-16 NOTE — Progress Notes (Signed)
Orthopedic Tech Progress Note Patient Details:  [name] Gundlach Sr. 04-10-1976 [id number]  Ortho Devices Type of Ortho Device: Darco shoe Ortho Device/Splint Interventions: Application   [place] Pink 08/16/2015, 11:40 AM

## 2015-08-16 NOTE — Progress Notes (Signed)
Patient ID: [name] [place]el[place] Sr., male   DOB: [birth year], 39 y.o.   MRN: [id number] Tolerated surgery well.  Right foot dressing clean, dry, and intact.  Can put weight thru his right heel in a Darco shoe (ordered from [employer]).  Can go home when medically stable and follow-up with [name] as an outpatient.

## 2015-08-16 NOTE — Progress Notes (Signed)
[facility] Progress Note  Assessment/Plan: 1. R great toe diabetic foot ulcer- s/p amp 12/30 due to failed conservative therapy; on flagyl, Vanc and Zosyn/pain control; ok from ortho for d/c today if stable with Darco shoe; ? If need to continue abx after d/c 2. ESRD - TTS - HD today start on 3 K pending lab results 3. Hypertension/volume - very low gains and no urine outpt, BP runs high - suspect it will improve some with challenged volume- only takes amlodipine- interesting that BP is better now - have resumed norvasc for HS 4. Anemia - Hgb 9.2 istat - had been stable off ESA - drop likely secondary to infection - resume ESA today  5. Metabolic bone disease - Continue calcitriol and binders- takes 1 phoslo and 2 fosrenol 6. Nutrition -  renal/carb mod diet/vitamins 7. Intermittent adherence issues with dialysis treatments/BP meds/appropriate diet- 8. Disp - probable d/c post HD today  Myriam Jacobson, PA-C [place] [phone] 08/16/2015,10:18 AM  LOS: 1 day   Pt seen, examined and agree w A/P as above.  Kelly Splinter MD [facility] pager [phone]    cell [phone] 08/16/2015, 1:13 PM    Subjective:   Foot hurts post surgery- didn't have this kind of pain after left toe amputation  Objective Filed Vitals:   08/15/15 1830 08/15/15 2107 08/16/15 0401 08/16/15 0838  BP: 187/95 155/72 168/81 159/68  Pulse: 85 101 102 97  Temp: 98.1 F (36.7 C) 98.7 F (37.1 C) 98.4 F (36.9 C) 98.1 F (36.7 C)  TempSrc: Oral Oral Oral Oral  Resp: 16 16 16 17   Height:      Weight:   91.1 kg (200 lb 13.4 oz)   SpO2: 92% 96% 97% 95%   Physical Exam General: NAD Heart: tachy ~100 reg Lungs:no rales Abdomen: soft NT Extremities: no sig edema right foot wrapped Dialysis Access: left upper AVF + bruit  Dialysis Orders: [facility] TTS 4 hr 200 500/800 EDW 87.5 2K 2 Ca profile 2 heparin 8000 venofer 50/week no ESA calcitriol 2 left upper  AVF, calcitriol 2 venofer 50 Recent labs; Hgb 9.1 12/29 - trending down from 13 - needs ESA, Ca 9s P down to 6.6 iPTH 328 26% sat 12/15   Additional Objective Labs: Basic Metabolic Panel:  Recent Labs Lab 08/14/15 2012 08/15/15 1508  NA 134* 135  K 3.8 3.7  CL 92*  --   CO2 30  --   GLUCOSE 146* 82  BUN 22*  --   CREATININE 9.08*  --   CALCIUM 9.2  --    Liver Function Tests:  Recent Labs Lab 08/14/15 2012  AST 16  ALT <5*  ALKPHOS 53  BILITOT 0.5  PROT 7.2  ALBUMIN 3.1*   CBC:  Recent Labs Lab 08/14/15 2012 08/15/15 1508  WBC 8.1  --   NEUTROABS 4.9  --   HGB 8.6* 9.2*  HCT 27.3* 27.0*  MCV 83.2  --   PLT 523*  --    Blood Culture    Component Value Date/Time   SDES BLOOD RIGHT HAND 08/15/2015 0000   SPECREQUEST BOTTLES DRAWN AEROBIC AND ANAEROBIC 5CC 08/15/2015 0000   CULT NO GROWTH 1 DAY 08/15/2015 0000   REPTSTATUS PENDING 08/15/2015 0000   CBG:  Recent Labs Lab 08/15/15 1732 08/15/15 1805 08/15/15 2200 08/16/15 0745 08/16/15 0836  GLUCAP 126* 134* 155* 59* 89    Studies/Results: Dg Toe Great Right  08/14/2015  CLINICAL DATA:  Medial right great toe pain at the nail bed with draining infection. Diabetes. EXAM: RIGHT GREAT TOE COMPARISON:  None. FINDINGS: Diffuse distal foot soft tissue swelling. There is some focal osteopenia in the medial aspect of the proximal portion of the first distal phalanx. Otherwise, no bone destruction or periosteal reaction. No soft tissue gas. Extensive arterial calcifications. IMPRESSION: 1. Focal osteopenia in the medial aspect of the proximal portion of the first distal phalanx. This could be an early indication of osteomyelitis. Recommend consideration of an MRI of the foot without and with contrast. 2. Extensive atheromatous arterial calcifications. Electronically Signed   By: Claudie Revering M.D.   On: 08/14/2015 21:03   Medications: . sodium chloride 10 mL/hr at 08/15/15 1506  . sodium chloride     .  amLODipine  10 mg Oral QHS  . atorvastatin  80 mg Oral q1800  . calcitRIOL  2 mcg Oral Q T,Th,Sa-HD  . calcium acetate  667 mg Oral TID WC  . cefTRIAXone (ROCEPHIN)  IV  2 g Intravenous Q24H   And  . metroNIDAZOLE  500 mg Oral 3 times per day  . darbepoetin (ARANESP) injection - DIALYSIS  60 mcg Intravenous Q Sat-HD  . feeding supplement (NEPRO CARB STEADY)  237 mL Oral BID BM  . [START ON 08/21/2015] ferric gluconate (FERRLECIT/NULECIT) IV  62.5 mg Intravenous Q Thu-HD  . insulin aspart  0-9 Units Subcutaneous TID WC  . insulin glargine  10 Units Subcutaneous QHS  . lanthanum  2,000 mg Oral TID WC  . multivitamin  1 tablet Oral QHS  . [START ON 08/19/2015] vancomycin  1,000 mg Intravenous Q T,Th,Sa-HD

## 2015-08-16 NOTE — Progress Notes (Signed)
Verified with Dr. Ninfa Linden that patient's right foot post op dressing is to remain intact until he goes to his follow up appt. Patient to be discharged today.  Joellen Jersey, RN.

## 2015-08-17 ENCOUNTER — Other Ambulatory Visit: Payer: Self-pay | Admitting: Internal Medicine

## 2015-08-19 ENCOUNTER — Encounter (HOSPITAL_COMMUNITY): Payer: Self-pay | Admitting: Orthopedic Surgery

## 2015-08-19 NOTE — Anesthesia Postprocedure Evaluation (Signed)
Anesthesia Post Note  Patient: [name].  Procedure(s) Performed: Procedure(s) (LRB): Right Great Toe Amputation (Right)  Patient location during evaluation: PACU Anesthesia Type: General Level of consciousness: awake and alert Pain management: pain level controlled Vital Signs Assessment: post-procedure vital signs reviewed and stable Respiratory status: spontaneous breathing, nonlabored ventilation, respiratory function stable and patient connected to nasal cannula oxygen Cardiovascular status: blood pressure returned to baseline and stable Postop Assessment: no signs of nausea or vomiting Anesthetic complications: no    Last Vitals:  Filed Vitals:   08/16/15 1629 08/16/15 1657  BP: 158/49 159/76  Pulse: 91 103  Temp:  36.9 C  Resp:  17    Last Pain:  Filed Vitals:   08/16/15 1658  PainSc: 6                  Devarius Nelles S

## 2015-08-20 LAB — CULTURE, BLOOD (ROUTINE X 2)
CULTURE: NO GROWTH
Culture: NO GROWTH

## 2016-05-20 ENCOUNTER — Ambulatory Visit (INDEPENDENT_AMBULATORY_CARE_PROVIDER_SITE_OTHER): Payer: Medicaid Other | Admitting: Podiatry

## 2016-05-20 ENCOUNTER — Ambulatory Visit (INDEPENDENT_AMBULATORY_CARE_PROVIDER_SITE_OTHER): Payer: Medicaid Other

## 2016-05-20 DIAGNOSIS — M79671 Pain in right foot: Secondary | ICD-10-CM

## 2016-05-20 DIAGNOSIS — L97521 Non-pressure chronic ulcer of other part of left foot limited to breakdown of skin: Secondary | ICD-10-CM

## 2016-05-20 DIAGNOSIS — L03032 Cellulitis of left toe: Secondary | ICD-10-CM | POA: Diagnosis not present

## 2016-05-20 DIAGNOSIS — L02612 Cutaneous abscess of left foot: Secondary | ICD-10-CM | POA: Diagnosis not present

## 2016-05-20 DIAGNOSIS — M79672 Pain in left foot: Secondary | ICD-10-CM

## 2016-05-20 DIAGNOSIS — L851 Acquired keratosis [keratoderma] palmaris et plantaris: Secondary | ICD-10-CM

## 2016-05-20 DIAGNOSIS — L84 Corns and callosities: Secondary | ICD-10-CM

## 2016-05-20 MED ORDER — SULFAMETHOXAZOLE-TRIMETHOPRIM 800-160 MG PO TABS: 1.0000 | ORAL_TABLET | Freq: Two times a day (BID) | ORAL | 0 refills | Status: DC

## 2016-05-20 NOTE — Progress Notes (Signed)
   Subjective:    Patient ID: [name] Labella Sr., male    DOB: April 01, 1976, 40 y.o.   MRN: [id number]  HPI    Review of Systems  Endocrine: Positive for cold intolerance.  All other systems reviewed and are negative.      Objective:   Physical Exam        Assessment & Plan:

## 2016-05-22 NOTE — Progress Notes (Signed)
Patient ID: [name] Portela Sr., male   DOB: 1976/07/19, 40 y.o.   MRN: [id number] Subjective:  40 year old male presents today for multiple complaints of bilateral foot issues. Patient has a history of bilateral great toe amputations. He is a diabetic and currently on dialysis.    Objective/Physical Exam General: The patient is alert and oriented x3 in no acute distress.  Dermatology: Soft tissue abscesses skin breakdown noted to the distal tuft of the third digit left foot. Purulent drainage noted.  Hyperkeratotic callus lesions noted to the second digits bilaterally.  Skin is warm, dry and supple bilateral lower extremities.   Vascular: Palpable pedal pulses bilaterally. No edema or erythema noted. Capillary refill within normal limits.  Neurological: Epicritic and protective threshold diminished bilaterally.   Musculoskeletal Exam: Range of motion within normal limits to all pedal and ankle joints bilateral. Muscle strength 5/5 in all groups bilateral.   Radiographic Exam:  Evidence of bilateral great toe amputations noted. Normal osseous mineralization. Joint spaces preserved. No fracture/dislocation/boney destruction.    Assessment: #1 diabetes mellitus with peripheral neuropathy #2 abscess third digit left foot #3 callus lesions second digits bilateral #4 history of bilateral great toe amputations.   Plan of Care:  #1 Patient was evaluated. #2 today incision and drainage was performed of the abscess located to the third digit left foot. Purulent drainage noted. Cultures were taken today. #3 excisional debridement of callus lesions performed to the bilateral great toes using a chisel blade. #4 today prescription for Bactrim was dispensed for 14 days. #5 prescription for diabetic shoes was dispensed for [facility] lab #6 patient is to return to clinic in 2 weeks     Dr. [name], [place]

## 2016-06-03 ENCOUNTER — Ambulatory Visit: Payer: Medicaid Other | Admitting: Podiatry

## 2016-06-09 ENCOUNTER — Telehealth: Payer: Self-pay | Admitting: *Deleted

## 2016-06-09 NOTE — Telephone Encounter (Signed)
Pt presents to office with paperwork he received from [facility] for diabetic shoes.  Completed [facility] Diabetic Therapeutic Shoe Program Prescription and Statement of Certifying Physician for Therapeutic Shoes, and sent copies to be scanned to pt's chart. Pt left the office and states will return later for forms.

## 2016-06-16 ENCOUNTER — Ambulatory Visit (INDEPENDENT_AMBULATORY_CARE_PROVIDER_SITE_OTHER): Payer: Medicaid Other | Admitting: Podiatry

## 2016-06-16 DIAGNOSIS — L97509 Non-pressure chronic ulcer of other part of unspecified foot with unspecified severity: Secondary | ICD-10-CM

## 2016-06-16 DIAGNOSIS — E1143 Type 2 diabetes mellitus with diabetic autonomic (poly)neuropathy: Secondary | ICD-10-CM

## 2016-06-16 DIAGNOSIS — L97522 Non-pressure chronic ulcer of other part of left foot with fat layer exposed: Secondary | ICD-10-CM

## 2016-06-16 DIAGNOSIS — E08621 Diabetes mellitus due to underlying condition with foot ulcer: Secondary | ICD-10-CM

## 2016-06-16 DIAGNOSIS — I70245 Atherosclerosis of native arteries of left leg with ulceration of other part of foot: Secondary | ICD-10-CM

## 2016-06-16 DIAGNOSIS — E0843 Diabetes mellitus due to underlying condition with diabetic autonomic (poly)neuropathy: Secondary | ICD-10-CM

## 2016-06-16 MED ORDER — GABAPENTIN 100 MG PO CAPS: 300.0000 mg | ORAL_CAPSULE | Freq: Three times a day (TID) | ORAL | 1 refills | Status: DC

## 2016-06-20 NOTE — Progress Notes (Signed)
Subjective:  Patient with a history of diabetes mellitus presents today for follow-up evaluation of a third toe ulcer to the left foot. Patient also states that his foot feels like Fire. Patient States That He Has Completed His Antibiotic.    Objective/Physical Exam General: The patient is alert and oriented x3 in no acute distress.  Dermatology: Wound noted to the distal tuft of the third digit left foot. To the noted ulceration there is no eschar there is minimal slough fibrin and necrotic tissue noted. Granulation tissue and wound base is red. There is a moderate amount of serosanguineous drainage noted. No malodor. There is no exposed bone muscle-tendon ligament or joint. Periwound integrity is intact.  Skin is otherwise warm, dry and supple bilateral lower extremities.  Vascular: Palpable pedal pulses bilaterally. No edema or erythema noted. Capillary refill within normal limits.  Neurological: Epicritic and protective threshold grossly intact bilaterally. Burning sensation noted bilateral lower extremities likely due to manifestations of peripheral neuropathy secondary to diabetes mellitus.  Musculoskeletal Exam: Range of motion within normal limits to all pedal and ankle joints bilateral. Muscle strength 5/5 in all groups bilateral.   Assessment: #1 ulcer third digit left foot secondary to diabetes mellitus #2 diabetes mellitus with manifestations of peripheral neuropathy #3 pain in foot   Plan of Care:  #1 Patient was evaluated. #2 medically necessary excisional debridement including subcutaneous tissue was performed using a tissue nipper. Excisional debridement of all necrotic nonviable tissue down to healthy bleeding viable tissue was performed with post-debridement measurements and was pre-. #3 wound was cleansed with normal saline and dry sterile dressing applied #4 prescription for gabapentin 300 mg #5 patient is to return to clinic in 2 weeks   Dr. [name],  [place]

## 2016-06-24 ENCOUNTER — Ambulatory Visit: Payer: Medicaid Other | Admitting: Podiatry

## 2016-07-01 ENCOUNTER — Ambulatory Visit: Payer: Medicaid Other | Admitting: Podiatry

## 2016-07-06 ENCOUNTER — Telehealth: Payer: Self-pay | Admitting: *Deleted

## 2016-07-06 NOTE — Telephone Encounter (Signed)
Pt states he needs an earlier appt than 07/19/2016, his toe is turning black and is much more painful. I asked scheduler A. Horton to get in today or tomorrow. Pt is scheduled for 07/07/2016 at 3:15pm.

## 2016-07-07 ENCOUNTER — Telehealth: Payer: Self-pay | Admitting: *Deleted

## 2016-07-07 ENCOUNTER — Encounter: Payer: Self-pay | Admitting: Podiatry

## 2016-07-07 ENCOUNTER — Ambulatory Visit (INDEPENDENT_AMBULATORY_CARE_PROVIDER_SITE_OTHER): Payer: Medicaid Other | Admitting: Podiatry

## 2016-07-07 VITALS — BP 148/70 | HR 80 | Resp 16

## 2016-07-07 DIAGNOSIS — L97522 Non-pressure chronic ulcer of other part of left foot with fat layer exposed: Secondary | ICD-10-CM

## 2016-07-07 DIAGNOSIS — L97524 Non-pressure chronic ulcer of other part of left foot with necrosis of bone: Secondary | ICD-10-CM

## 2016-07-07 DIAGNOSIS — I70245 Atherosclerosis of native arteries of left leg with ulceration of other part of foot: Secondary | ICD-10-CM

## 2016-07-07 DIAGNOSIS — E0843 Diabetes mellitus due to underlying condition with diabetic autonomic (poly)neuropathy: Secondary | ICD-10-CM

## 2016-07-07 MED ORDER — HYDROCODONE-ACETAMINOPHEN 10-325 MG PO TABS: 1.0000 | ORAL_TABLET | Freq: Three times a day (TID) | ORAL | 0 refills | Status: DC | PRN

## 2016-07-07 NOTE — Telephone Encounter (Addendum)
-----   Message from [name], DPM sent at 07/07/2016  5:08 PM EST ----- Regarding: Vascular consult Please schedule vascular consult.   Dx : ischemic ulcer 3rd digit left foot. PVD.   Thanks! 07/14/2016-Billie - [place] states she is unable to contact pt with the current phone number listed, states pt is not currently accepting calls. I gave Billie pt's emergency contact name and number from [employer] and told he I tell the emergency contact person I have pt's number, but can not contact will she please give pt the number I want called to my office. Dalene Seltzer states understanding.

## 2016-07-19 ENCOUNTER — Ambulatory Visit: Payer: Medicaid Other | Admitting: Podiatry

## 2016-07-21 ENCOUNTER — Ambulatory Visit (INDEPENDENT_AMBULATORY_CARE_PROVIDER_SITE_OTHER): Payer: Medicaid Other | Admitting: Podiatry

## 2016-07-21 DIAGNOSIS — E1351 Other specified diabetes mellitus with diabetic peripheral angiopathy without gangrene: Secondary | ICD-10-CM

## 2016-07-21 DIAGNOSIS — L97524 Non-pressure chronic ulcer of other part of left foot with necrosis of bone: Secondary | ICD-10-CM | POA: Diagnosis not present

## 2016-07-21 DIAGNOSIS — I70245 Atherosclerosis of native arteries of left leg with ulceration of other part of foot: Secondary | ICD-10-CM | POA: Diagnosis not present

## 2016-07-21 DIAGNOSIS — E0843 Diabetes mellitus due to underlying condition with diabetic autonomic (poly)neuropathy: Secondary | ICD-10-CM

## 2016-07-21 NOTE — Progress Notes (Signed)
Subjective:  Patient with a history of diabetes mellitus presents today for follow-up evaluation of a third toe ulcer to the left foot. Patient also states that his foot feels like Fire. Patient believes that the toe looks darker today compared to the last visit.    Objective/Physical Exam General: The patient is alert and oriented x3 in no acute distress.  Dermatology: Wound noted to the distal tuft of the third digit left foot measuring [phone] cm. To the noted ulceration there is no overlying eschar. Upon debridement of the eschar there is a minimal amount of slough fibrin and necrotic tissue noted. There is exposed bone. There is no exposed tendon or joint. There is a minimal amount of serosanguineous drainage noted. No malodor. Periwound integrity is intact. Skin is otherwise warm, dry and supple bilateral lower extremities.  Vascular: Palpable pedal pulses bilaterally. No edema or erythema noted. Capillary refill within normal limits.  Neurological: Epicritic and protective threshold grossly intact bilaterally. Burning sensation noted bilateral lower extremities likely due to manifestations of peripheral neuropathy secondary to diabetes mellitus.  Musculoskeletal Exam: Range of motion within normal limits to all pedal and ankle joints bilateral. Muscle strength 5/5 in all groups bilateral.   Assessment: #1 ulcer third digit left foot secondary to diabetes mellitus #2 diabetes mellitus with manifestations of peripheral neuropathy #3 pain in foot   Plan of Care:  #1 Patient was evaluated. #2 medically necessary excisional debridement including muscle and deep fascial tissue was performed using a tissue nipper. Excisional debridement of all necrotic nonviable tissue down to healthy bleeding viable tissue was performed with post-debridement measurements and was pre-. #3 wound was cleansed with normal saline and dry sterile dressing applied #4 continue gabapentin 300mg   #5 instructions  for home health care including daily application of iodine and dry sterile dressing to the distal tuft ulcer. #6 prescription for hydrocodone #7 patient is to return to clinic in 2 weeks   Dr. [name], [place]

## 2016-07-24 NOTE — Progress Notes (Signed)
Subjective:  Patient with a history of diabetes mellitus presents today for follow-up evaluation of a third toe ulcer to the left foot. Patient also states that his foot feels like Fire. Patient states pain is 10 out of 10 regarding the ulceration to the third digit left foot. Patient believes that the toe looks darker today compared to the last visit.    Objective/Physical Exam General: The patient is alert and oriented x3 in no acute distress.  Dermatology: Wound noted to the distal tuft of the third digit left foot measuring [phone] cm. To the noted ulceration there is no overlying eschar. Wound base is red. Granulation tissue is red. There is exposed bone. There is no exposed tendon or joint. There is a minimal amount of serosanguineous drainage noted. No malodor. Periwound integrity is intact. Skin is otherwise warm, dry and supple bilateral lower extremities.  Vascular: Palpable pedal pulses bilaterally. No edema or erythema noted.   Neurological: Epicritic and protective threshold grossly intact bilaterally. Burning sensation noted bilateral lower extremities likely due to manifestations of peripheral neuropathy secondary to diabetes mellitus.  Musculoskeletal Exam: Range of motion within normal limits to all pedal and ankle joints bilateral. Muscle strength 5/5 in all groups bilateral.   Assessment: #1 ischemic, stable ulcer third digit left foot secondary to diabetes mellitus #2 diabetes mellitus with manifestations of peripheral neuropathy #3 pain in foot   Plan of Care:  #1 Patient was evaluated. #2 medically necessary excisional debridement including muscle and deep fascial tissue was performed using a tissue nipper. Excisional debridement of all necrotic nonviable tissue down to healthy bleeding viable tissue was performed with post-debridement measurements and was pre-. #3 wound was cleansed with normal saline and dry sterile dressing applied #4 continue gabapentin 300mg    #5 continue home health care including daily application of iodine and dry sterile dressing to the distal tuft ulcer. #6 prescription for hydrocodone #7 patient states that he is currently being managed by vascular for peripheral vascular disease. #8 patient is to return to clinic in 2 weeks   Dr. [name], [place]

## 2016-07-28 ENCOUNTER — Encounter (HOSPITAL_COMMUNITY): Payer: Self-pay | Admitting: *Deleted

## 2016-07-28 ENCOUNTER — Emergency Department (HOSPITAL_COMMUNITY)
Admission: EM | Admit: 2016-07-28 | Discharge: 2016-07-29 | Disposition: A | Discharge status: Home / Self Care | Payer: Medicaid Other | Attending: Emergency Medicine | Admitting: Emergency Medicine

## 2016-07-28 DIAGNOSIS — N186 End stage renal disease: Secondary | ICD-10-CM | POA: Diagnosis not present

## 2016-07-28 DIAGNOSIS — Z7982 Long term (current) use of aspirin: Secondary | ICD-10-CM | POA: Diagnosis not present

## 2016-07-28 DIAGNOSIS — Z794 Long term (current) use of insulin: Secondary | ICD-10-CM | POA: Diagnosis not present

## 2016-07-28 DIAGNOSIS — I5042 Chronic combined systolic (congestive) and diastolic (congestive) heart failure: Secondary | ICD-10-CM | POA: Diagnosis not present

## 2016-07-28 DIAGNOSIS — Z992 Dependence on renal dialysis: Secondary | ICD-10-CM | POA: Insufficient documentation

## 2016-07-28 DIAGNOSIS — I96 Gangrene, not elsewhere classified: Secondary | ICD-10-CM | POA: Diagnosis not present

## 2016-07-28 DIAGNOSIS — Z87891 Personal history of nicotine dependence: Secondary | ICD-10-CM | POA: Insufficient documentation

## 2016-07-28 DIAGNOSIS — I2510 Atherosclerotic heart disease of native coronary artery without angina pectoris: Secondary | ICD-10-CM | POA: Diagnosis not present

## 2016-07-28 DIAGNOSIS — E1122 Type 2 diabetes mellitus with diabetic chronic kidney disease: Secondary | ICD-10-CM | POA: Diagnosis not present

## 2016-07-28 DIAGNOSIS — I132 Hypertensive heart and chronic kidney disease with heart failure and with stage 5 chronic kidney disease, or end stage renal disease: Secondary | ICD-10-CM | POA: Insufficient documentation

## 2016-07-28 DIAGNOSIS — M79675 Pain in left toe(s): Secondary | ICD-10-CM | POA: Diagnosis present

## 2016-07-28 LAB — COMPREHENSIVE METABOLIC PANEL
ALT: 12 U/L — AB (ref 17–63)
AST: 18 U/L (ref 15–41)
Albumin: 3.9 g/dL (ref 3.5–5.0)
Alkaline Phosphatase: 64 U/L (ref 38–126)
Anion gap: 16 — ABNORMAL HIGH (ref 5–15)
BILIRUBIN TOTAL: 0.8 mg/dL (ref 0.3–1.2)
BUN: 23 mg/dL — AB (ref 6–20)
CO2: 29 mmol/L (ref 22–32)
CREATININE: 9.85 mg/dL — AB (ref 0.61–1.24)
Calcium: 9.4 mg/dL (ref 8.9–10.3)
Chloride: 89 mmol/L — ABNORMAL LOW (ref 101–111)
GFR, EST AFRICAN AMERICAN: 7 mL/min — AB (ref >60)
GFR, EST NON AFRICAN AMERICAN: 6 mL/min — AB (ref >60)
Glucose, Bld: 373 mg/dL — ABNORMAL HIGH (ref 65–99)
Potassium: 4 mmol/L (ref 3.5–5.1)
Sodium: 134 mmol/L — ABNORMAL LOW (ref 135–145)
TOTAL PROTEIN: 8.2 g/dL — AB (ref 6.5–8.1)

## 2016-07-28 LAB — CBC WITH DIFFERENTIAL/PLATELET
BASOS ABS: 0 10*3/uL (ref 0.0–0.1)
Basophils Relative: 0 %
EOS PCT: 1 %
Eosinophils Absolute: 0.1 10*3/uL (ref 0.0–0.7)
HEMATOCRIT: 48.4 % (ref 39.0–52.0)
Hemoglobin: 16.4 g/dL (ref 13.0–17.0)
LYMPHS ABS: 2.2 10*3/uL (ref 0.7–4.0)
LYMPHS PCT: 31 %
MCH: 29.2 pg (ref 26.0–34.0)
MCHC: 33.9 g/dL (ref 30.0–36.0)
MCV: 86.3 fL (ref 78.0–100.0)
MONO ABS: 0.6 10*3/uL (ref 0.1–1.0)
Monocytes Relative: 8 %
NEUTROS ABS: 4.3 10*3/uL (ref 1.7–7.7)
Neutrophils Relative %: 60 %
PLATELETS: 340 10*3/uL (ref 150–400)
RBC: 5.61 MIL/uL (ref 4.22–5.81)
RDW: 16.5 % — AB (ref 11.5–15.5)
WBC: 7.2 10*3/uL (ref 4.0–10.5)

## 2016-07-28 MED ORDER — OXYCODONE-ACETAMINOPHEN 5-325 MG PO TABS
1.0000 | ORAL_TABLET | ORAL | 0 refills | Status: DC | PRN
Start: 2016-07-28 — End: 2016-08-04

## 2016-07-28 MED ORDER — OXYCODONE-ACETAMINOPHEN 5-325 MG PO TABS
1.0000 | ORAL_TABLET | Freq: Once | ORAL | Status: AC
  Administered 2016-07-28: 1 via ORAL
  Filled 2016-07-28: qty 1

## 2016-07-28 NOTE — ED Triage Notes (Signed)
Pt c/o pain in the third toe on the L foot x 2 weeks. Reports area started as a callous, has been told that there is no circulation to toe and may need an amputation

## 2016-07-28 NOTE — ED Provider Notes (Signed)
[facility] DEPT Provider Note   CSN: [id number] Arrival date & time: 07/28/16  6387  By signing my name below, I, Higinio Plan, attest that this documentation has been prepared under the direction and in the presence of Daleen Bo, MD . Electronically Signed: Higinio Plan, Scribe. 07/28/2016. 11:12 PM.  History   Chief Complaint Chief Complaint  Patient presents with  . Toe Pain   The history is provided by the patient. No language interpreter was used.   HPI Comments: [name] Mcelhannon Sr. is a 40 y.o. male with PMHx of DM and HTN, who presents to the Emergency Department complaining of gradually worsening, left third toe pain that began ~2 weeks ago. Pt reports he is currently followed by a podiatrist and was recently prescribed hydrocodone for his pain. He states he was experiencing relief with this medication but it gradually "stopped working." He notes associated "peeling" to the area. He denies seeing a vascular specialist for his toe pain.   Past Medical History:  Diagnosis Date  . CAD (coronary artery disease)   . Chronic combined systolic and diastolic heart failure ([place])   . Diabetes mellitus   . Hypertension   . Renal disorder dialysis   [name]    Patient Active Problem List   Diagnosis Date Noted  . Anemia of renal disease 08/16/2015  . Wound infection 08/15/2015  . Diabetic ulcer of right great toe ([place]) 08/15/2015  . Pain in the chest   . Elevated troponin   . Essential hypertension   . 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 Diabetic foot ulcer ([place]) 08/07/2013  . ESRD on dialysis ([place]) 08/07/2013  . Diabetes ([place]) 08/07/2013  . Chest pain 05/16/2012  . Murmur 05/16/2012  . Renal disorder     Past Surgical History:  Procedure Laterality Date  . AMPUTATION Left 09/27/2013   Procedure: LEFT GREAT TOE AMPUTATION;  Surgeon: [name], MD;  Location: [facility];  Service: Orthopedics;  Laterality: Left;  . AMPUTATION Right  08/15/2015   Procedure: Right Great Toe Amputation;  Surgeon: [name], MD;  Location: [facility];  Service: Orthopedics;  Laterality: Right;  . AV FISTULA PLACEMENT  left arm  . LEFT HEART CATHETERIZATION WITH CORONARY ANGIOGRAM N/A 09/13/2014   Procedure: LEFT HEART CATHETERIZATION WITH CORONARY ANGIOGRAM;  Surgeon: Sinclair Grooms, MD;  Location: [facility] CATH LAB;  Service: Cardiovascular;  Laterality: N/A;       Home Medications    Prior to Admission medications   Medication Sig Start Date End Date Taking? Authorizing Provider  acetaminophen (TYLENOL) 500 MG tablet Take 1,000 mg by mouth every 6 (six) hours as needed for mild pain.    Historical Provider, MD  amLODipine (NORVASC) 10 MG tablet Take 1 tablet (10 mg total) by mouth at bedtime. 08/16/15   [name], MD  aspirin-acetaminophen-caffeine (EXCEDRIN MIGRAINE) [phone] MG per tablet Take 2 tablets by mouth every 6 (six) hours as needed for headache.    Historical Provider, MD  atorvastatin (LIPITOR) 80 MG tablet Take 1 tablet (80 mg total) by mouth daily at 6 PM. Patient not taking: Reported on 05/20/2016 09/15/14   Francesca Oman, DO  calcium acetate (PHOSLO) 667 MG capsule Take 1 capsule (667 mg total) by mouth 3 (three) times daily with meals. 08/16/15   [name], MD  gabapentin (NEURONTIN) 100 MG capsule Take 3 capsules (300 mg total) by mouth 3 (three) times daily. 06/16/16   [name],  DPM  glucagon (GLUCAGON EMERGENCY) 1 MG injection Inject 1 mg into the vein once as needed (low blood sugar and inability to eat or drink sugar). Patient not taking: Reported on 05/20/2016 08/16/15   [name], MD  glucose 4 GM chewable tablet Chew 1 tablet (4 g total) by mouth as needed for low blood sugar. 08/16/15   [name], MD  insulin glargine (LANTUS) 100 UNIT/ML injection Inject 0.1 mLs (10 Units total) into the skin at bedtime. Patient taking differently: Inject 48 Units into the skin at bedtime.  09/15/14   Francesca Oman, DO  lanthanum (FOSRENOL) 1000 MG chewable tablet Chew 2 tablets (2,000 mg total) by mouth 3 (three) times daily with meals. 08/16/15   [name], MD  oxyCODONE-acetaminophen (PERCOCET) 5-325 MG tablet Take 1 tablet by mouth every 4 (four) hours as needed for severe pain. 07/28/16   Daleen Bo, MD  senna-docusate (SENOKOT-S) 8.6-50 MG tablet Take 2 tablets by mouth 2 (two) times daily as needed for mild constipation. 08/16/15   [name], MD  sulfamethoxazole-trimethoprim (BACTRIM DS,SEPTRA DS) 800-160 MG tablet Take 1 tablet by mouth 2 (two) times daily. 05/20/16   [name], DPM    Family History Family History  Problem Relation Age of Onset  . Heart failure Mother   . Hypertension Mother     Social History Social History  Substance Use Topics  . Smoking status: Former Smoker    Packs/day: 0.25    Years: 0.00    Types: Cigarettes    Quit date: 09/27/2014  . Smokeless tobacco: Never Used  . Alcohol use Yes     Allergies   Coconut oil   Review of Systems Review of Systems  Constitutional: Negative for fever.  Musculoskeletal: Positive for arthralgias.   Physical Exam Updated Vital Signs BP 108/77 (BP Location: Right Arm)   Pulse 98   Temp 98.3 F (36.8 C) (Oral)   Resp 18   SpO2 98%   Physical Exam  Constitutional: He appears well-developed. No distress.  Appears older than stated age.  HENT:  Head: Normocephalic and atraumatic.  Neck: Neck supple.  Cardiovascular: Normal rate.   Pulmonary/Chest: Effort normal.  Musculoskeletal:  Left third toe with atrophy, or discoloration, with chronic appearance. Small amount of clear discharge, without bleeding. No proximal swelling, drainage or skin changes to indicate a sending infection.  Neurological: He is alert. Coordination normal.  Skin: He is not diaphoretic.  Psychiatric: He has a normal mood and affect. His behavior is normal. Judgment and thought content normal.  Nursing note and  vitals reviewed.  ED Treatments / Results  Labs (all labs ordered are listed, but only abnormal results are displayed) Labs Reviewed  CBC WITH DIFFERENTIAL/PLATELET - Abnormal; Notable for the following:       Result Value   RDW 16.5 (*)    All other components within normal limits  COMPREHENSIVE METABOLIC PANEL - Abnormal; Notable for the following:    Sodium 134 (*)    Chloride 89 (*)    Glucose, Bld 373 (*)    BUN 23 (*)    Creatinine, Ser 9.85 (*)    Total Protein 8.2 (*)    ALT 12 (*)    GFR calc non Af Amer 6 (*)    GFR calc Af Amer 7 (*)    Anion gap 16 (*)    All other components within normal limits    EKG  EKG Interpretation None  Radiology No results found.  Procedures Procedures (including critical care time)  Medications Ordered in ED Medications  oxyCODONE-acetaminophen (PERCOCET/ROXICET) 5-325 MG per tablet 1 tablet (1 tablet Oral Given 07/28/16 2332)    DIAGNOSTIC STUDIES:  Oxygen Saturation is 98% on RA, normal by my interpretation.    COORDINATION OF CARE:  11:12 PM Discussed treatment plan with pt at bedside and pt agreed to plan.  Initial Impression / Assessment and Plan / ED Course  I have reviewed the triage vital signs and the nursing notes.  Pertinent labs & imaging results that were available during my care of the patient were reviewed by me and considered in my medical decision making (see chart for details).  Clinical Course     Medications  oxyCODONE-acetaminophen (PERCOCET/ROXICET) 5-325 MG per tablet 1 tablet (1 tablet Oral Given 07/28/16 2332)    Patient Vitals for the past 24 hrs:  BP Temp Temp src Pulse Resp SpO2  07/28/16 2144 108/77 - - 98 18 98 %  07/28/16 1929 125/67 98.3 F (36.8 C) Oral 118 18 98 %    11:57 PM Reevaluation with update and discussion. After initial assessment and treatment, an updated evaluation reveals No change in clinical status. Findings discussed with patient and all questions  answered. Wal[place]ga Hudman L    Final Clinical Impressions(s) / ED Diagnoses   Final diagnoses:  Toe gangrene (HCC)    Gangrenous toe, no evidence for systemic infection, metabolic instability or sepsis. Blood sugar high tonight because he did not take his evening insulin. No indication for hospitalization at this time. He has access to follow-up care.  Nursing Notes Reviewed/ Care Coordinated Applicable Imaging Reviewed Interpretation of Laboratory Data incorporated into ED treatment  The patient appears reasonably screened and/or stabilized for discharge and I doubt any other medical condition or other [facility] requiring further screening, evaluation, or treatment in the ED at this time prior to discharge.  Plan: Home Medications- continue; Home Treatments- rest; return here if the recommended treatment, does not improve the symptoms; Recommended follow up- PCP prn. Ortho/Foot specialist asap for amputation toe    New Prescriptions New Prescriptions   OXYCODONE-ACETAMINOPHEN (PERCOCET) 5-325 MG TABLET    Take 1 tablet by mouth every 4 (four) hours as needed for severe pain.   I personally performed the services described in this documentation, which was scribed in my presence. The recorded information has been reviewed and is accurate.     Daleen Bo, MD 07/29/16 0001

## 2016-07-28 NOTE — Discharge Instructions (Signed)
Take your insulin as soon as you can.  Drink plenty of fluids.  Elevate your left foot above the heart, as much as possible to decrease the toe pain.

## 2016-07-29 NOTE — ED Notes (Signed)
Given cab voucher for discharge, nad noted,

## 2016-08-02 ENCOUNTER — Ambulatory Visit (INDEPENDENT_AMBULATORY_CARE_PROVIDER_SITE_OTHER): Payer: Medicaid Other | Admitting: Orthopedic Surgery

## 2016-08-02 ENCOUNTER — Encounter (INDEPENDENT_AMBULATORY_CARE_PROVIDER_SITE_OTHER): Payer: Self-pay | Admitting: Orthopedic Surgery

## 2016-08-02 VITALS — Ht 74.0 in | Wt 196.0 lb

## 2016-08-02 DIAGNOSIS — M869 Osteomyelitis, unspecified: Secondary | ICD-10-CM | POA: Diagnosis not present

## 2016-08-02 DIAGNOSIS — L97509 Non-pressure chronic ulcer of other part of unspecified foot with unspecified severity: Secondary | ICD-10-CM

## 2016-08-02 DIAGNOSIS — I96 Gangrene, not elsewhere classified: Secondary | ICD-10-CM | POA: Insufficient documentation

## 2016-08-02 DIAGNOSIS — M6702 Short Achilles tendon (acquired), left ankle: Secondary | ICD-10-CM | POA: Diagnosis not present

## 2016-08-02 DIAGNOSIS — E1169 Type 2 diabetes mellitus with other specified complication: Secondary | ICD-10-CM | POA: Diagnosis not present

## 2016-08-02 DIAGNOSIS — E11621 Type 2 diabetes mellitus with foot ulcer: Secondary | ICD-10-CM

## 2016-08-02 DIAGNOSIS — M6701 Short Achilles tendon (acquired), right ankle: Secondary | ICD-10-CM | POA: Diagnosis not present

## 2016-08-02 NOTE — Progress Notes (Signed)
Office Visit Note   Patient: [name] Kovar Sr.           Date of Birth: 12-27-1975           MRN: [id number] Visit Date: 08/02/2016              Requested by: Mauricia Area, MD [address] [place], [place] [address] PCP: Placido Sou, MD   Assessment & Plan: Visit Diagnoses:  1. Gangrene of left foot ([place])   2. Diabetic foot ulcer with osteomyelitis ([place])   3. Gangrene of right foot ([place])   4. Achilles tendon contracture, bilateral     Plan: Patient has dry gangrene left foot third toe and right foot second toe. Patient is referred to vascular vein surgery for urgent consultation for possible revascularization bilateral lower extremities. Patient has no cellulitis no drainage no signs of acute infection. Follow-up in 2 weeks.  Follow-Up Instructions: Return in about 2 weeks (around 08/16/2016).   Orders:  Orders Placed This Encounter  Procedures  . Ambulatory referral to Vascular Surgery   No orders of the defined types were placed in this encounter.     Procedures: No procedures performed   Clinical Data: No additional findings.   Subjective: Chief Complaint  Patient presents with  . Left Foot - Pain    Pt follow up from the ER this weekend. Left foot third toe gangrene "toe needs to come off" states that he had been treated by podiatry and they were going to let the toe self amputate. The patient complains of pain there is no dressing being applied. He is using crutches for ambulation and is in regular shoe wear. He also states that his right foot second toe nail has come off but he is not having any problems with this.     Review of Systems   Objective: Vital Signs: Ht 6\' 2"  (1.88 m)   Wt 196 lb (88.9 kg)   BMI 25.16 kg/m   Physical Exam on examination patient is alert oriented no adenopathy well-dressed normal affect normal respiratory effort he has an antalgic gait currently uses crutches for ambulation. Examination patient is a very faint  thready dorsalis pedis pulse and posterior tibial pulse bilaterally. His feet are cold to the touch. Patient has dry gangrene left foot third toe and right foot second toe. There is no ascending cellulitis no drainage no odor no signs of infection. Patient has significant heel cord contracture dorsiflexion about 10 short of neutral bilaterally with callus beneath the metatarsal heads bilaterally which is tender to palpation.  Ortho Exam  Specialty Comments:  No specialty comments available.  Imaging: No results found.   PMFS History: Patient Active Problem List   Diagnosis Date Noted  . Gangrene of left foot ([place]) 08/02/2016  . Gangrene of right foot ([place]) 08/02/2016  . Achilles tendon contracture, bilateral 08/02/2016  . Anemia of renal disease 08/16/2015  . Wound infection 08/15/2015  . Diabetic ulcer of right great toe ([place]) 08/15/2015  . Pain in the chest   . Elevated troponin   . Essential hypertension   . 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 Diabetic foot ulcer ([place]) 08/07/2013  . ESRD on dialysis ([place]) 08/07/2013  . Diabetes ([place]) 08/07/2013  . Chest pain 05/16/2012  . Murmur 05/16/2012  . Renal disorder    Past Medical History:  Diagnosis Date  . CAD (coronary artery disease)   . Chronic combined systolic and diastolic heart failure ([place])   .  Diabetes mellitus   . Hypertension   . Renal disorder dialysis   MWF [facility]    Family History  Problem Relation Age of Onset  . Heart failure Mother   . Hypertension Mother     Past Surgical History:  Procedure Laterality Date  . AMPUTATION Left 09/27/2013   Procedure: LEFT GREAT TOE AMPUTATION;  Surgeon: [name], MD;  Location: [facility];  Service: Orthopedics;  Laterality: Left;  . AMPUTATION Right 08/15/2015   Procedure: Right Great Toe Amputation;  Surgeon: [name], MD;  Location: [facility];  Service: Orthopedics;  Laterality: Right;  . AV FISTULA PLACEMENT  left arm  . LEFT HEART  CATHETERIZATION WITH CORONARY ANGIOGRAM N/A 09/13/2014   Procedure: LEFT HEART CATHETERIZATION WITH CORONARY ANGIOGRAM;  Surgeon: Sinclair Grooms, MD;  Location: [facility] CATH LAB;  Service: Cardiovascular;  Laterality: N/A;   Social History   Occupational History  . Not on file.   Social History Main Topics  . Smoking status: Former Smoker    Packs/day: 0.25    Years: 0.00    Types: Cigarettes    Quit date: 09/27/2014  . Smokeless tobacco: Never Used  . Alcohol use Yes  . Drug use:     Types: Marijuana  . Sexual activity: Not on file

## 2016-08-04 ENCOUNTER — Ambulatory Visit (INDEPENDENT_AMBULATORY_CARE_PROVIDER_SITE_OTHER): Payer: Medicaid Other | Admitting: Podiatry

## 2016-08-04 ENCOUNTER — Other Ambulatory Visit: Payer: Self-pay | Admitting: *Deleted

## 2016-08-04 ENCOUNTER — Telehealth: Payer: Self-pay | Admitting: *Deleted

## 2016-08-04 DIAGNOSIS — L97524 Non-pressure chronic ulcer of other part of left foot with necrosis of bone: Secondary | ICD-10-CM

## 2016-08-04 DIAGNOSIS — I70245 Atherosclerosis of native arteries of left leg with ulceration of other part of foot: Secondary | ICD-10-CM

## 2016-08-04 DIAGNOSIS — E1351 Other specified diabetes mellitus with diabetic peripheral angiopathy without gangrene: Secondary | ICD-10-CM

## 2016-08-04 DIAGNOSIS — E0843 Diabetes mellitus due to underlying condition with diabetic autonomic (poly)neuropathy: Secondary | ICD-10-CM

## 2016-08-04 DIAGNOSIS — L97522 Non-pressure chronic ulcer of other part of left foot with fat layer exposed: Secondary | ICD-10-CM

## 2016-08-04 DIAGNOSIS — L97509 Non-pressure chronic ulcer of other part of unspecified foot with unspecified severity: Principal | ICD-10-CM

## 2016-08-04 DIAGNOSIS — I7025 Atherosclerosis of native arteries of other extremities with ulceration: Secondary | ICD-10-CM

## 2016-08-04 MED ORDER — OXYCODONE-ACETAMINOPHEN 5-325 MG PO TABS: 1.0000 | ORAL_TABLET | Freq: Three times a day (TID) | ORAL | 0 refills | Status: DC | PRN

## 2016-08-04 NOTE — Progress Notes (Signed)
Subjective:  Patient with a history of diabetes mellitus presents today for follow-up evaluation of a third toe ulcer to the left foot. Patient also states that his foot feels like Fire. Patient states pain is 10 out of 10 regarding the ulceration to the third digit left foot. Patient believes that the toe looks darker today compared to the last visit.    Objective/Physical Exam General: The patient is alert and oriented x3 in no acute distress.  Dermatology: Wound noted to the distal tuft of the third digit left foot measuring [phone] cm. To the noted ulceration there is a stable dry overlying eschar. Wound base is red. Granulation tissue is red. There is exposed bone. There is no exposed tendon or joint. There is a minimal amount of serosanguineous drainage noted. No malodor. Periwound integrity is intact. Skin is otherwise warm, dry and supple bilateral lower extremities.  Vascular: Diminished pedal pulses bilaterally. No edema or erythema noted.   Neurological: Epicritic and protective threshold grossly intact bilaterally. Burning sensation noted bilateral lower extremities likely due to manifestations of peripheral neuropathy secondary to diabetes mellitus.  Musculoskeletal Exam: Range of motion within normal limits to all pedal and ankle joints bilateral. Muscle strength 5/5 in all groups bilateral.   Assessment: #1 ischemic, stable ulcer third digit left foot secondary to diabetes mellitus #2 diabetes mellitus with manifestations of peripheral neuropathy #3 pain in foot   Plan of Care:  #1 Patient was evaluated. #2 medically necessary excisional debridement including muscle and deep fascial tissue was performed using a tissue nipper. Excisional debridement of all necrotic nonviable tissue down to healthy bleeding viable tissue was performed with post-debridement measurements and was pre-. #3 wound was cleansed with normal saline and dry sterile dressing applied #4 continue  gabapentin 300mg   #5 continue home health care including daily application of iodine and dry sterile dressing to the third digit ulcer. #6 prescription for Percocet 05/325mg  #7 referral placed for interventional cardiology and vascular evaluation  #8 today a postoperative shoe was dispensed  #9 patient is to return to clinic in 2 weeks   Dr. [name], [place]

## 2016-08-04 NOTE — Telephone Encounter (Addendum)
-----   Message from [name], DPM sent at 08/04/2016  9:00 AM EST ----- Regarding: Interventional cardiology consult (Dr. Kennon Holter group) Please refer to Dr. Kennon Holter Interventional Cardiology group. Not vascular.   Dx : lower extremity PVD. Ischemic gangrene 3rd digit left foot. Faxed referral, clinicals and demographics to [facility]. 10/25/2016-Male caller states pt would like to be referred to Dr. Allegra Lai a podiatrist at [facility] [phone]. I told her I would inform Dr. Amalia Hailey. 10/26/2016-Faxed referral, clinicals and demographics to Dr. Allegra Lai.

## 2016-08-12 ENCOUNTER — Encounter: Payer: Self-pay | Admitting: Vascular Surgery

## 2016-08-18 ENCOUNTER — Ambulatory Visit (INDEPENDENT_AMBULATORY_CARE_PROVIDER_SITE_OTHER): Payer: Medicaid Other | Admitting: Podiatry

## 2016-08-18 DIAGNOSIS — E0843 Diabetes mellitus due to underlying condition with diabetic autonomic (poly)neuropathy: Secondary | ICD-10-CM

## 2016-08-18 DIAGNOSIS — L97523 Non-pressure chronic ulcer of other part of left foot with necrosis of muscle: Secondary | ICD-10-CM | POA: Diagnosis not present

## 2016-08-18 DIAGNOSIS — I70245 Atherosclerosis of native arteries of left leg with ulceration of other part of foot: Secondary | ICD-10-CM | POA: Diagnosis not present

## 2016-08-18 DIAGNOSIS — L97509 Non-pressure chronic ulcer of other part of unspecified foot with unspecified severity: Secondary | ICD-10-CM

## 2016-08-18 DIAGNOSIS — E1143 Type 2 diabetes mellitus with diabetic autonomic (poly)neuropathy: Secondary | ICD-10-CM

## 2016-08-18 DIAGNOSIS — E08621 Diabetes mellitus due to underlying condition with foot ulcer: Secondary | ICD-10-CM

## 2016-08-18 MED ORDER — SULFAMETHOXAZOLE-TRIMETHOPRIM 800-160 MG PO TABS: 1.0000 | ORAL_TABLET | Freq: Two times a day (BID) | ORAL | 0 refills | Status: DC

## 2016-08-19 ENCOUNTER — Ambulatory Visit (INDEPENDENT_AMBULATORY_CARE_PROVIDER_SITE_OTHER): Payer: Medicaid Other | Admitting: Orthopedic Surgery

## 2016-08-22 NOTE — Progress Notes (Signed)
Subjective:  Patient with a history of diabetes mellitus presents today for follow-up evaluation of a third toe ulcer to the left foot. Patient presents today in moderate pain. Patient states that he does have an appointment with vascular on 08/25/2016   Objective/Physical Exam General: The patient is alert and oriented x3 in no acute distress.  Dermatology: Wound noted to the distal tuft of the third digit left foot measuring [phone] cm. To the noted ulceration there is a stable dry overlying eschar with stable necrosis of the third digit. Wound base is red. Granulation tissue is red. There is exposed bone. There is exposed tendon or joint. There is a minimal amount of serosanguineous drainage noted. No malodor. Periwound integrity is intact. Skin is otherwise warm, dry and supple bilateral lower extremities.  Vascular: Diminished pedal pulses bilaterally. No edema or erythema noted.   Neurological: Epicritic and protective threshold grossly intact bilaterally. Burning sensation noted bilateral lower extremities likely due to manifestations of peripheral neuropathy secondary to diabetes mellitus.  Musculoskeletal Exam: Range of motion within normal limits to all pedal and ankle joints bilateral. Muscle strength 5/5 in all groups bilateral.   Assessment: #1 ischemic, stable ulcer third digit left foot secondary to diabetes mellitus #2 diabetes mellitus with manifestations of peripheral neuropathy #3 ischemic eschar fifth digit left foot  pain in foot   Plan of Care:  #1 Patient was evaluated. #2 medically necessary excisional debridement including muscle and deep fascial tissue was performed using a tissue nipper. Excisional debridement of all necrotic nonviable tissue down to healthy bleeding viable tissue was performed with post-debridement measurements and was pre-. #3 wound was cleansed with normal saline and dry sterile dressing applied #4 continue gabapentin 300mg   #5 continue  home health care including daily application of iodine and dry sterile dressing to the third digit ulcer. #6 prescription for Bactrim DS #7 patient is to return to clinic in 2 weeks  Ask about vascular appointment next visit  [name], DPM [facility]  Dr. [name], [address]                                        [address], [place] [address]                Office [phone]  Fax [phone]

## 2016-08-24 ENCOUNTER — Encounter: Payer: Medicaid Other | Admitting: Cardiovascular Disease

## 2016-08-24 ENCOUNTER — Encounter: Payer: Self-pay | Admitting: *Deleted

## 2016-08-25 ENCOUNTER — Ambulatory Visit (INDEPENDENT_AMBULATORY_CARE_PROVIDER_SITE_OTHER): Payer: Medicaid Other | Admitting: Vascular Surgery

## 2016-08-25 ENCOUNTER — Ambulatory Visit (HOSPITAL_COMMUNITY)
Admission: RE | Admit: 2016-08-25 | Discharge: 2016-08-25 | Disposition: A | Discharge status: Home / Self Care | Payer: Medicaid Other | Source: Ambulatory Visit | Attending: Vascular Surgery | Admitting: Vascular Surgery

## 2016-08-25 ENCOUNTER — Encounter: Payer: Self-pay | Admitting: Vascular Surgery

## 2016-08-25 ENCOUNTER — Other Ambulatory Visit: Payer: Self-pay

## 2016-08-25 VITALS — BP 133/63 | HR 82 | Temp 97.8°F | Resp 18 | Ht 74.0 in | Wt 180.8 lb

## 2016-08-25 DIAGNOSIS — L97509 Non-pressure chronic ulcer of other part of unspecified foot with unspecified severity: Secondary | ICD-10-CM

## 2016-08-25 DIAGNOSIS — I70235 Atherosclerosis of native arteries of right leg with ulceration of other part of foot: Secondary | ICD-10-CM | POA: Diagnosis not present

## 2016-08-25 DIAGNOSIS — I7025 Atherosclerosis of native arteries of other extremities with ulceration: Secondary | ICD-10-CM | POA: Diagnosis not present

## 2016-08-25 DIAGNOSIS — I70245 Atherosclerosis of native arteries of left leg with ulceration of other part of foot: Secondary | ICD-10-CM | POA: Diagnosis not present

## 2016-08-25 NOTE — Progress Notes (Signed)
Vascular and Vein Specialist of [place]  Patient name: [name] Ingwersen Sr. MRN: [id number] DOB: Oct 17, 1975 Sex: male  REASON FOR CONSULT: Bilateral lower extremity foot ulceration  HPI: [name] Curto Sr. is a 40 y.o. male, who is here today for evaluation of lower external ureter insufficiency. He is end-stage renal disease patient. Also has a history of coronary artery disease with congestive failure. Also diabetic and hypertensive. He undergoes dialysis on Monday Wednesday and Friday. He has a long history of difficulty with diabetic foot infections. He is status post right great toe amputation approximate 4 years ago and left great toe amputation approximately one year ago. He is now had progressive changes of ulceration over both feet. He has not had any invasive infection. He has no former evaluation for arterial flow. He does report severe pain in both feet over the ulcerations. He does walk with crutches but frequently is in a wheelchair as well.  Past Medical History:  Diagnosis Date  . Atherosclerosis of lower extremity ([place])   . CAD (coronary artery disease)   . Chronic combined systolic and diastolic heart failure ([place])   . Diabetes mellitus   . Hypertension   . Renal disorder dialysis   MWF [facility]    Family History  Problem Relation Age of Onset  . Heart failure Mother   . Hypertension Mother     SOCIAL HISTORY: Social History   Social History  . Marital status: Legally Separated    Spouse name: N/A  . Number of children: N/A  . Years of education: N/A   Occupational History  . Not on file.   Social History Main Topics  . Smoking status: Former Smoker    Packs/day: 0.25    Years: 0.00    Types: Cigarettes    Quit date: 09/27/2014  . Smokeless tobacco: Never Used  . Alcohol use Yes  . Drug use:     Types: Marijuana  . Sexual activity: Not on file   Other Topics Concern  . Not on file   Social  History Narrative  . No narrative on file    Allergies  Allergen Reactions  . Coconut Oil Anaphylaxis    Can use topically,    Current Outpatient Prescriptions  Medication Sig Dispense Refill  . acetaminophen (TYLENOL) 500 MG tablet Take 1,000 mg by mouth every 6 (six) hours as needed for mild pain.    Marland Kitchen amLODipine (NORVASC) 10 MG tablet Take 1 tablet (10 mg total) by mouth at bedtime. 30 tablet 0  . aspirin-acetaminophen-caffeine (EXCEDRIN MIGRAINE) 932-671-24 MG per tablet Take 2 tablets by mouth every 6 (six) hours as needed for headache.    Marland Kitchen atorvastatin (LIPITOR) 80 MG tablet Take 1 tablet (80 mg total) by mouth daily at 6 PM. 30 tablet 0  . calcium acetate (PHOSLO) 667 MG capsule Take 1 capsule (667 mg total) by mouth 3 (three) times daily with meals. 90 capsule 0  . gabapentin (NEURONTIN) 100 MG capsule Take 3 capsules (300 mg total) by mouth 3 (three) times daily. 60 capsule 1  . glucagon (GLUCAGON EMERGENCY) 1 MG injection Inject 1 mg into the vein once as needed (low blood sugar and inability to eat or drink sugar). 1 each 0  . glucose 4 GM chewable tablet Chew 1 tablet (4 g total) by mouth as needed for low blood sugar. 50 tablet 0  . insulin glargine (LANTUS) 100 UNIT/ML injection Inject 0.1 mLs (10 Units total) into the skin  at bedtime. (Patient taking differently: Inject 48 Units into the skin at bedtime. ) 10 mL 12  . lanthanum (FOSRENOL) 1000 MG chewable tablet Chew 2 tablets (2,000 mg total) by mouth 3 (three) times daily with meals. 180 tablet 0  . oxyCODONE-acetaminophen (ROXICET) 5-325 MG tablet Take 1 tablet by mouth every 8 (eight) hours as needed for severe pain. 30 tablet 0  . senna-docusate (SENOKOT-S) 8.6-50 MG tablet Take 2 tablets by mouth 2 (two) times daily as needed for mild constipation. 60 tablet 0  . sulfamethoxazole-trimethoprim (BACTRIM DS,SEPTRA DS) 800-160 MG tablet Take 1 tablet by mouth 2 (two) times daily. 20 tablet 0   No current  facility-administered medications for this visit.     REVIEW OF SYSTEMS:  [X]  denotes positive finding, [ ]  denotes negative finding Cardiac  Comments:  Chest pain or chest pressure:    Shortness of breath upon exertion: x   Short of breath when lying flat:    Irregular heart rhythm:        Vascular    Pain in calf, thigh, or hip brought on by ambulation:    Pain in feet at night that wakes you up from your sleep:  x   Blood clot in your veins:    Leg swelling:         Pulmonary    Oxygen at home:    Productive cough:     Wheezing:         Neurologic    Sudden weakness in arms or legs:     Sudden numbness in arms or legs:     Sudden onset of difficulty speaking or slurred speech:    Temporary loss of vision in one eye:     Problems with dizziness:         Gastrointestinal    Blood in stool:     Vomited blood:         Genitourinary    Burning when urinating:     Blood in urine:        Psychiatric    Major depression:         Hematologic    Bleeding problems:    Problems with blood clotting too easily:        Skin    Rashes or ulcers: x       Constitutional    Fever or chills:      PHYSICAL EXAM: Vitals:   08/25/16 1458  BP: 133/63  Pulse: 82  Resp: 18  Temp: 97.8 F (36.6 C)  TempSrc: Oral  SpO2: 97%  Weight: 180 lb 12.4 oz (82 kg)  Height: 6\' 2"  (1.88 m)    GENERAL: The patient is a well-nourished male, in no acute distress. The vital signs are documented above. CARDIOVASCULAR: 2+ radial pulses bilaterally. 2+ popliteal pulses bilaterally. Absent popliteal pulses PULMONARY: There is good air exchange  ABDOMEN: Soft and non-tender  MUSCULOSKELETAL: Healed great toe amputations bilaterally NEUROLOGIC: No focal weakness or paresthesias are detected. SKIN: On his right foot he has gangrenous change is on the tip of the second toe and also ulceration on the plantar aspect of the foot fifth metatarsal head. On the left foot he has dry gangrene of his  third toe and a ulceration and a crack on the lateral heel. Also has a crack and ulceration between his fourth and fifth toe on the left. PSYCHIATRIC: The patient has a normal affect.  DATA:  Noninvasive studies show noncompressible vessels with monophasic waveforms  at the posterior tibial and dorsalis pedis bilaterally  MEDICAL ISSUES: I discussed this at length with patient. He does have palpable popliteal pulses with probable tibial disease. Have recommended lower from a arteriography were further evaluation. Certainly is at very high risk for continued nonhealing of these ulcerations. Explained there is a possibility that he may have lesions that were amenable to angioplasty. Also explained may require bypass for improvement of flow and also explained may be non-reconstructable. We will schedule arteriography at his earliest convenience and make further recommendations   Rosetta Posner, MD [facility] V[facility] and Ve[facility] Specialists of [facility] Tel [phone] Pager [phone]

## 2016-08-30 ENCOUNTER — Other Ambulatory Visit: Payer: Self-pay | Admitting: *Deleted

## 2016-09-01 ENCOUNTER — Ambulatory Visit: Payer: Medicaid Other | Admitting: Podiatry

## 2016-09-02 ENCOUNTER — Ambulatory Visit (HOSPITAL_COMMUNITY): Admission: RE | Admit: 2016-09-02 | Payer: Medicaid Other | Source: Ambulatory Visit | Admitting: Surgery

## 2016-09-02 ENCOUNTER — Encounter (HOSPITAL_COMMUNITY): Admission: RE | Payer: Self-pay | Source: Ambulatory Visit

## 2016-09-02 SURGERY — ABDOMINAL AORTOGRAM W/LOWER EXTREMITY
Anesthesia: LOCAL

## 2016-09-06 ENCOUNTER — Ambulatory Visit: Payer: Medicaid Other | Admitting: Podiatry

## 2016-09-11 ENCOUNTER — Emergency Department (HOSPITAL_COMMUNITY)
Admission: EM | Admit: 2016-09-11 | Discharge: 2016-09-11 | Disposition: A | Discharge status: Home / Self Care | Payer: Medicaid Other | Attending: Emergency Medicine | Admitting: Emergency Medicine

## 2016-09-11 ENCOUNTER — Encounter (HOSPITAL_COMMUNITY): Payer: Self-pay | Admitting: Emergency Medicine

## 2016-09-11 ENCOUNTER — Emergency Department (HOSPITAL_COMMUNITY): Payer: Medicaid Other

## 2016-09-11 DIAGNOSIS — N186 End stage renal disease: Secondary | ICD-10-CM | POA: Insufficient documentation

## 2016-09-11 DIAGNOSIS — Z794 Long term (current) use of insulin: Secondary | ICD-10-CM | POA: Diagnosis not present

## 2016-09-11 DIAGNOSIS — Z79899 Other long term (current) drug therapy: Secondary | ICD-10-CM | POA: Insufficient documentation

## 2016-09-11 DIAGNOSIS — Z992 Dependence on renal dialysis: Secondary | ICD-10-CM | POA: Diagnosis not present

## 2016-09-11 DIAGNOSIS — M79675 Pain in left toe(s): Secondary | ICD-10-CM | POA: Diagnosis present

## 2016-09-11 DIAGNOSIS — Z87891 Personal history of nicotine dependence: Secondary | ICD-10-CM | POA: Insufficient documentation

## 2016-09-11 DIAGNOSIS — G8929 Other chronic pain: Secondary | ICD-10-CM | POA: Insufficient documentation

## 2016-09-11 DIAGNOSIS — I5042 Chronic combined systolic (congestive) and diastolic (congestive) heart failure: Secondary | ICD-10-CM | POA: Insufficient documentation

## 2016-09-11 DIAGNOSIS — I132 Hypertensive heart and chronic kidney disease with heart failure and with stage 5 chronic kidney disease, or end stage renal disease: Secondary | ICD-10-CM | POA: Diagnosis not present

## 2016-09-11 DIAGNOSIS — I2510 Atherosclerotic heart disease of native coronary artery without angina pectoris: Secondary | ICD-10-CM | POA: Insufficient documentation

## 2016-09-11 LAB — COMPREHENSIVE METABOLIC PANEL
ALBUMIN: 2.7 g/dL — AB (ref 3.5–5.0)
ALK PHOS: 43 U/L (ref 38–126)
ALT: 24 U/L (ref 17–63)
AST: 21 U/L (ref 15–41)
Anion gap: 13 (ref 5–15)
BUN: 20 mg/dL (ref 6–20)
CALCIUM: 7.8 mg/dL — AB (ref 8.9–10.3)
CHLORIDE: 93 mmol/L — AB (ref 101–111)
CO2: 29 mmol/L (ref 22–32)
CREATININE: 10.17 mg/dL — AB (ref 0.61–1.24)
GFR calc Af Amer: 7 mL/min — ABNORMAL LOW (ref >60)
GFR calc non Af Amer: 6 mL/min — ABNORMAL LOW (ref >60)
GLUCOSE: 184 mg/dL — AB (ref 65–99)
Potassium: 3.7 mmol/L (ref 3.5–5.1)
SODIUM: 135 mmol/L (ref 135–145)
Total Bilirubin: 0.9 mg/dL (ref 0.3–1.2)
Total Protein: 7.3 g/dL (ref 6.5–8.1)

## 2016-09-11 LAB — CBC WITH DIFFERENTIAL/PLATELET
BASOS ABS: 0 10*3/uL (ref 0.0–0.1)
Basophils Relative: 0 %
EOS ABS: 0.3 10*3/uL (ref 0.0–0.7)
EOS PCT: 3 %
HCT: 31 % — ABNORMAL LOW (ref 39.0–52.0)
HEMOGLOBIN: 10 g/dL — AB (ref 13.0–17.0)
LYMPHS ABS: 2.1 10*3/uL (ref 0.7–4.0)
Lymphocytes Relative: 20 %
MCH: 26.7 pg (ref 26.0–34.0)
MCHC: 32.3 g/dL (ref 30.0–36.0)
MCV: 82.9 fL (ref 78.0–100.0)
Monocytes Absolute: 0.6 10*3/uL (ref 0.1–1.0)
Monocytes Relative: 5 %
Neutro Abs: 7.3 10*3/uL (ref 1.7–7.7)
Neutrophils Relative %: 72 %
PLATELETS: 542 10*3/uL — AB (ref 150–400)
RBC: 3.74 MIL/uL — AB (ref 4.22–5.81)
RDW: 16.6 % — ABNORMAL HIGH (ref 11.5–15.5)
WBC: 10.2 10*3/uL (ref 4.0–10.5)

## 2016-09-11 LAB — CBG MONITORING, ED: GLUCOSE-CAPILLARY: 223 mg/dL — AB (ref 65–99)

## 2016-09-11 MED ORDER — HYDROCODONE-ACETAMINOPHEN 7.5-325 MG/15ML PO SOLN: 10.0000 mL | Freq: Once | ORAL | Status: DC

## 2016-09-11 MED ORDER — DOXYCYCLINE HYCLATE 100 MG PO CAPS: 100.0000 mg | ORAL_CAPSULE | Freq: Two times a day (BID) | ORAL | 0 refills | Status: DC

## 2016-09-11 MED ORDER — HYDROCODONE-ACETAMINOPHEN 5-325 MG PO TABS
2.0000 | ORAL_TABLET | Freq: Once | ORAL | Status: AC
  Administered 2016-09-11: 2 via ORAL
  Filled 2016-09-11: qty 2

## 2016-09-11 MED ORDER — HYDROCODONE-ACETAMINOPHEN 5-325 MG PO TABS: 1.0000 | ORAL_TABLET | Freq: Four times a day (QID) | ORAL | 0 refills | Status: DC | PRN

## 2016-09-11 NOTE — ED Triage Notes (Signed)
Pt st's he is diabetic and c/o pain to 2nd and 5th toes on left foot.  Pt st's toes are draining and has a bad odor.  Onset  3 days ago

## 2016-09-11 NOTE — ED Notes (Signed)
Pt returned from x-ray, refused to be reconnected to BP cuff and SpO2 monitors.

## 2016-09-11 NOTE — ED Provider Notes (Signed)
[facility] DEPT Provider Note   CSN: [id number] Arrival date & time: 09/11/16  0028     History   Chief Complaint Chief Complaint  Patient presents with  . Foot Pain    HPI [name] Lovering Sr. is a 41 y.o. male.  Patient with past medical history of diabetes presents to the emergency department with chief complaint of toe pain. He states that he has pain on his left second through fifth toes. He reports that he has had some mild drainage and increased pain. He denies any fevers or chills. He is followed by podiatry. He states that his third toe is auto amputating. There are no other associated symptoms. Pain is worsened with palpation.   The history is provided by the patient. No language interpreter was used.    Past Medical History:  Diagnosis Date  . Atherosclerosis of lower extremity ([place])   . CAD (coronary artery disease)   . Chronic combined systolic and diastolic heart failure ([place])   . Diabetes mellitus   . Hypertension   . Renal disorder dialysis   [name]    Patient Active Problem List   Diagnosis Date Noted  . Gangrene of left foot ([place]) 08/02/2016  . Gangrene of right foot ([place]) 08/02/2016  . Achilles tendon contracture, bilateral 08/02/2016  . Anemia of renal disease 08/16/2015  . Wound infection 08/15/2015  . Diabetic ulcer of right great toe ([place]) 08/15/2015  . Pain in the chest   . Elevated troponin   . Essential hypertension   . 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 Diabetic foot ulcer ([place]) 08/07/2013  . ESRD on dialysis ([place]) 08/07/2013  . Diabetes ([place]) 08/07/2013  . Chest pain 05/16/2012  . Murmur 05/16/2012  . Renal disorder     Past Surgical History:  Procedure Laterality Date  . AMPUTATION Left 09/27/2013   Procedure: LEFT GREAT TOE AMPUTATION;  Surgeon: [name], MD;  Location: [facility];  Service: Orthopedics;  Laterality: Left;  . AMPUTATION Right 08/15/2015   Procedure: Right Great Toe  Amputation;  Surgeon: [name], MD;  Location: [facility] [facility];  Service: Orthopedics;  Laterality: Right;  . AV FISTULA PLACEMENT  left arm  . LEFT HEART CATHETERIZATION WITH CORONARY ANGIOGRAM N/A 09/13/2014   Procedure: LEFT HEART CATHETERIZATION WITH CORONARY ANGIOGRAM;  Surgeon: Sinclair Grooms, MD;  Location: [facility] CATH LAB;  Service: Cardiovascular;  Laterality: N/A;       Home Medications    Prior to Admission medications   Medication Sig Start Date End Date Taking? Authorizing Provider  acetaminophen (TYLENOL) 500 MG tablet Take 1,000 mg by mouth every 6 (six) hours as needed for mild pain.   Yes Historical Provider, MD  amLODipine (NORVASC) 10 MG tablet Take 1 tablet (10 mg total) by mouth at bedtime. 08/16/15  Yes [name], MD  atorvastatin (LIPITOR) 80 MG tablet Take 1 tablet (80 mg total) by mouth daily at 6 PM. 09/15/14  Yes Francesca Oman, DO  calcium acetate (PHOSLO) 667 MG capsule Take 1 capsule (667 mg total) by mouth 3 (three) times daily with meals. Patient taking differently: Take 1,334 mg by mouth 3 (three) times daily with meals.  08/16/15  Yes [name], MD  gabapentin (NEURONTIN) 100 MG capsule Take 3 capsules (300 mg total) by mouth 3 (three) times daily. Patient taking differently: Take 100 mg by mouth 3 (three) times daily.  06/16/16  Yes [name], DPM  glucagon (GLUCAGON  EMERGENCY) 1 MG injection Inject 1 mg into the vein once as needed (low blood sugar and inability to eat or drink sugar). 08/16/15  Yes [name], MD  glucose 4 GM chewable tablet Chew 1 tablet (4 g total) by mouth as needed for low blood sugar. 08/16/15  Yes [name], MD  insulin glargine (LANTUS) 100 UNIT/ML injection Inject 0.1 mLs (10 Units total) into the skin at bedtime. Patient taking differently: Inject 15 Units into the skin at bedtime.  09/15/14  Yes [name], DO  lanthanum (FOSRENOL) 1000 MG chewable tablet Chew 2 tablets (2,000 mg total) by mouth 3 (three)  times daily with meals. 08/16/15  Yes [name], MD  oxyCODONE-acetaminophen (ROXICET) 5-325 MG tablet Take 1 tablet by mouth every 8 (eight) hours as needed for severe pain. Patient not taking: Reported on 08/27/2016 08/04/16   [name], DPM  senna-docusate (SENOKOT-S) 8.6-50 MG tablet Take 2 tablets by mouth 2 (two) times daily as needed for mild constipation. Patient not taking: Reported on 08/27/2016 08/16/15   [name], MD  sulfamethoxazole-trimethoprim (BACTRIM DS,SEPTRA DS) 800-160 MG tablet Take 1 tablet by mouth 2 (two) times daily. Patient not taking: Reported on 09/11/2016 08/18/16   [name], DPM    Family History Family History  Problem Relation Age of Onset  . Heart failure Mother   . Hypertension Mother     Social History Social History  Substance Use Topics  . Smoking status: Former Smoker    Packs/day: 0.25    Years: 0.00    Types: Cigarettes    Quit date: 09/27/2014  . Smokeless tobacco: Never Used  . Alcohol use Yes     Allergies   Coconut oil   Review of Systems Review of Systems  Skin: Positive for wound.  All other systems reviewed and are negative.    Physical Exam Updated Vital Signs BP 137/58   Pulse 88   Temp 98.3 F (36.8 C) (Oral)   Resp 18   Ht 6\' 2"  (1.88 m)   Wt 86.2 kg   SpO2 97%   BMI 24.39 kg/m   Physical Exam  Constitutional: He is oriented to person, place, and time. He appears well-developed and well-nourished.  HENT:  Head: Normocephalic and atraumatic.  Eyes: Conjunctivae and EOM are normal.  Neck: Normal range of motion.  Cardiovascular: Normal rate and regular rhythm.   Pulmonary/Chest: Effort normal.  Abdominal: He exhibits no distension. There is no tenderness.  Musculoskeletal: Normal range of motion.  Neurological: He is alert and oriented to person, place, and time.  Skin: Skin is dry.  As pictured below  Psychiatric: He has a normal mood and affect. His behavior is normal. Judgment and  thought content normal.  Nursing note and vitals reviewed.        ED Treatments / Results  Labs (all labs ordered are listed, but only abnormal results are displayed) Labs Reviewed  CBC WITH DIFFERENTIAL/PLATELET - Abnormal; Notable for the following:       Result Value   RBC 3.74 (*)    Hemoglobin 10.0 (*)    HCT 31.0 (*)    RDW 16.6 (*)    Platelets 542 (*)    All other components within normal limits  COMPREHENSIVE METABOLIC PANEL - Abnormal; Notable for the following:    Chloride 93 (*)    Glucose, Bld 184 (*)    Creatinine, Ser 10.17 (*)    Calcium 7.8 (*)    Albumin  2.7 (*)    GFR calc non Af Amer 6 (*)    GFR calc Af Amer 7 (*)    All other components within normal limits  CBG MONITORING, ED - Abnormal; Notable for the following:    Glucose-Capillary 223 (*)    All other components within normal limits    EKG  EKG Interpretation None       Radiology Dg Foot Complete Left  Result Date: 09/11/2016 CLINICAL DATA:  41 year old male with diabetic foot ulcer. Patient complaining of pain to the second and fifth toes. EXAM: LEFT FOOT - COMPLETE 3+ VIEW COMPARISON:  Left foot radiograph dated 05/20/2016 an MRI dated 08/08/2013 FINDINGS: There is amputation of the great toe as well as amputation of the distal phalanx of the third toe. There is no acute fracture or dislocation. No periosteal reaction or bony erosion noted. There is diffuse vascular calcification. Probable small skin ulcer at the plantar aspect of the forefoot adjacent to the first metatarsal head. Clinical correlation is recommended. IMPRESSION: No acute fracture or dislocation. No radiographic evidence of osteomyelitis. MRI may provide better evaluation if there is high clinical concern for osteomyelitis. Amputation of the great toe and distal phalanx of the third digit. Probable small skin ulcer in the plantar aspect of the forefoot adjacent to the first metatarsal head. Clinical correlation is recommended.  Electronically Signed   By: Anner Crete M.D.   On: 09/11/2016 04:36    Procedures Procedures (including critical care time)  Medications Ordered in ED Medications  HYDROcodone-acetaminophen (NORCO/VICODIN) 5-325 MG per tablet 2 tablet (2 tablets Oral Given 09/11/16 0420)     Initial Impression / Assessment and Plan / ED Course  I have reviewed the triage vital signs and the nursing notes.  Pertinent labs & imaging results that were available during my care of the patient were reviewed by me and considered in my medical decision making (see chart for details).     Patient with acute on chronic left toe pain.  Symptoms appear to be chronic, and physical exam presentation is similar to previous descriptions. I discussed patient with Dr. Regenia Skeeter, who recommends treatment with doxycycline and close follow-up with podiatry or orthopedics. The patient is afebrile. He has no elevated blood cell count. He is nontoxic-appearing. We'll treat pain and discharge.  Final Clinical Impressions(s) / ED Diagnoses   Final diagnoses:  Pain of toe of left foot    New Prescriptions New Prescriptions   DOXYCYCLINE (VIBRAMYCIN) 100 MG CAPSULE    Take 1 capsule (100 mg total) by mouth 2 (two) times daily.   HYDROCODONE-ACETAMINOPHEN (NORCO/VICODIN) 5-325 MG TABLET    Take 1-2 tablets by mouth every 6 (six) hours as needed.     Montine Circle, PA-C 09/11/16 9735    Sherwood Gambler, MD 09/17/16 [phone]

## 2016-09-11 NOTE — ED Notes (Signed)
Patient transported to X-ray 

## 2016-10-25 NOTE — Telephone Encounter (Signed)
Per Dr Amalia Hailey yes please go ahead and refer patient to Dr Wardell Honour at [facility]

## 2016-10-27 ENCOUNTER — Encounter (INDEPENDENT_AMBULATORY_CARE_PROVIDER_SITE_OTHER): Payer: Self-pay | Admitting: Orthopedic Surgery

## 2016-10-27 ENCOUNTER — Ambulatory Visit (INDEPENDENT_AMBULATORY_CARE_PROVIDER_SITE_OTHER): Payer: Medicaid Other | Admitting: Orthopedic Surgery

## 2016-10-27 ENCOUNTER — Ambulatory Visit (INDEPENDENT_AMBULATORY_CARE_PROVIDER_SITE_OTHER): Payer: Self-pay

## 2016-10-27 VITALS — Ht 74.0 in | Wt 190.0 lb

## 2016-10-27 DIAGNOSIS — M869 Osteomyelitis, unspecified: Secondary | ICD-10-CM | POA: Diagnosis not present

## 2016-10-27 DIAGNOSIS — E1169 Type 2 diabetes mellitus with other specified complication: Secondary | ICD-10-CM | POA: Diagnosis not present

## 2016-10-27 DIAGNOSIS — I96 Gangrene, not elsewhere classified: Secondary | ICD-10-CM | POA: Diagnosis not present

## 2016-10-27 DIAGNOSIS — L97509 Non-pressure chronic ulcer of other part of unspecified foot with unspecified severity: Secondary | ICD-10-CM

## 2016-10-27 DIAGNOSIS — E11621 Type 2 diabetes mellitus with foot ulcer: Secondary | ICD-10-CM | POA: Diagnosis not present

## 2016-10-27 NOTE — Progress Notes (Signed)
Office Visit Note   Patient: [name] Trudo Sr.           Date of Birth: 1975/12/14           MRN: [id number] Visit Date: 10/27/2016              Requested by: Mauricia Area, MD [address] [place], [place] [address] PCP: Placido Sou, MD  Chief Complaint  Patient presents with  . Left Foot - Open Wound    HPI: Pt is here for gangrene of the left foot. The patient states that he was being seen by a podiatrist and that he was going to let the third toe self amputate but now there is a ulcer running beneath all of the toes small amount of drainage, maceration to the skin, no odor and complains of pain. The pt states that he was given an antibiotic today at dialysis and that he was to have vascular studies this week but that due to the weather he was not able to have this done. He is weight bearing with a cane and post op shoe and there is no dressing applied to the wound at all.  [name], RMA  Asian is a 41 year old gentleman who has been following with podiatry for quite some time for dry gangrene of his left third toe. States the plan of care had been to continue with conservative measures awaiting self amputation. Patient now complaining of spreading of the gangrene complains of ulceration beneath his fourth and fifth toes with some serosanguineous drainage. Denies any constitutional symptoms. There is no odor. States he would like to proceed with amputation. He was in dialysis earlier today where he was prescribed an antibiotic for concern of wound infection.  Did have a appointment with vascular surgery earlier this week. States he missed this appointment. Not been evaluated for revascularization. He is full weightbearing with a postop shoe on the left. Does not have a dressing applied to his toes.    Assessment & Plan: Visit Diagnoses:  1. Dry gangrene ([place])   2. Diabetic foot ulcer with osteomyelitis ([place])     Plan: We'll place an urgent referral to vascular  surgery to attempt to get him revascularize prior to amputation. As is non-toxic today. We will plan for amputation of the third fourth and fifth toes versus transmetatarsal amputation left foot. He will follow up with [place] in the office in 2 weeks. Advised him to continue taking his antibiotics as prescribed. Is also to cleanse the wound daily apply dry dressing.  Follow-Up Instructions: Return for 1 w post op.   Ortho Exam Physical Exam  Constitutional: Appears well-developed.  Head: Normocephalic.  Eyes: EOM are normal.  Neck: Normal range of motion.  Cardiovascular: Normal rate.   Pulmonary/Chest: Effort normal.  Neurological: Is alert.  Skin: Skin is warm.  Psychiatric: Has a normal mood and affect. Left foot: The great toe is surgically absent. The third toe has dry gangrene. The fourth and fifth toes are dark and there are ischemic changes. There is maceration in the webspaces and plantarly. There are no ulcerations that probes to bone. There is no purulence. There is no ascending cellulitis. There is no odor. I'm unable to palpate dorsalis pedis or posterior tib pulse. Imaging: No results found.  Labs: Lab Results  Component Value Date   HGBA1C 6.1 (H) 08/15/2015   HGBA1C 5.9 (H) 09/12/2014   HGBA1C (H) 01/30/2010    8.0 (NOTE)  According to the ADA Clinical Practice Recommendations for 2011, when HbA1c is used as a screening test:   >=6.5%   Diagnostic of Diabetes Mellitus           (if abnormal result  is confirmed)  5.7-6.4%   Increased risk of developing Diabetes Mellitus  References:Diagnosis and Classification of Diabetes Mellitus,Diabetes XBDZ,3299,24(QASTM 1):S62-S69 and Standards of Medical Care in         Diabetes - 2011,Diabetes HDQQ,2297,98  (Suppl 1):S11-S61.   ESRSEDRATE 94 (H) 08/15/2015   CRP 5.2 (H) 08/15/2015   REPTSTATUS 08/20/2015 FINAL 08/15/2015   GRAMSTAIN  08/08/2013    FEW WBC PRESENT,  PREDOMINANTLY PMN RARE SQUAMOUS EPITHELIAL CELLS PRESENT MODERATE GRAM POSITIVE COCCI IN PAIRS IN CLUSTERS Performed at [facility] NO GROWTH 5 DAYS 08/15/2015   LABORGA METHICILLIN RESISTANT STAPHYLOCOCCUS AUREUS 08/08/2013    Orders:  Orders Placed This Encounter  Procedures  . DG Foot 2 Views Left  . XR Foot 2 Views Left   No orders of the defined types were placed in this encounter.    Procedures: No procedures performed  Clinical Data: No additional findings.  Subjective: Review of Systems  Constitutional: Negative for chills and fever.    Objective: Vital Signs: Ht 6\' 2"  (1.88 m)   Wt 190 lb (86.2 kg)   BMI 24.39 kg/m   Specialty Comments:  No specialty comments available.  PMFS History: Patient Active Problem List   Diagnosis Date Noted  . Gangrene of left foot ([place]) 08/02/2016  . Gangrene of right foot ([place]) 08/02/2016  . Achilles tendon contracture, bilateral 08/02/2016  . Anemia of renal disease 08/16/2015  . Wound infection 08/15/2015  . Diabetic ulcer of right great toe ([place]) 08/15/2015  . Pain in the chest   . Elevated troponin   . Essential hypertension   . 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 Diabetic foot ulcer ([place]) 08/07/2013  . ESRD on dialysis ([place]) 08/07/2013  . Diabetes ([place]) 08/07/2013  . Chest pain 05/16/2012  . Murmur 05/16/2012  . Renal disorder    Past Medical History:  Diagnosis Date  . Atherosclerosis of lower extremity ([place])   . CAD (coronary artery disease)   . Chronic combined systolic and diastolic heart failure ([place])   . Diabetes mellitus   . Hypertension   . Renal disorder dialysis   MWF [facility]    Family History  Problem Relation Age of Onset  . Heart failure Mother   . Hypertension Mother     Past Surgical History:  Procedure Laterality Date  . AMPUTATION Left 09/27/2013   Procedure: LEFT GREAT TOE AMPUTATION;  Surgeon: [name], MD;  Location: [place] [facility];   Service: Orthopedics;  Laterality: Left;  . AMPUTATION Right 08/15/2015   Procedure: Right Great Toe Amputation;  Surgeon: [name], MD;  Location: [facility];  Service: Orthopedics;  Laterality: Right;  . AV FISTULA PLACEMENT  left arm  . LEFT HEART CATHETERIZATION WITH CORONARY ANGIOGRAM N/A 09/13/2014   Procedure: LEFT HEART CATHETERIZATION WITH CORONARY ANGIOGRAM;  Surgeon: Sinclair Grooms, MD;  Location: [facility] C[facility] LAB;  Service: Cardiovascular;  Laterality: N/A;   Social History   Occupational History  . Not on file.   Social History Main Topics  . Smoking status: Former Smoker    Packs/day: 0.25    Years: 0.00    Types: Cigarettes    Quit date: 09/27/2014  . Smokeless tobacco: Never Used  .  Alcohol use Yes  . Drug use: Yes    Types: Marijuana  . Sexual activity: Not on file

## 2016-10-28 ENCOUNTER — Other Ambulatory Visit: Payer: Self-pay

## 2016-11-02 ENCOUNTER — Telehealth (INDEPENDENT_AMBULATORY_CARE_PROVIDER_SITE_OTHER): Payer: Self-pay

## 2016-11-02 ENCOUNTER — Ambulatory Visit (HOSPITAL_COMMUNITY)
Admission: RE | Admit: 2016-11-02 | Discharge: 2016-11-02 | Disposition: A | Discharge status: Home / Self Care | Payer: Medicaid Other | Source: Ambulatory Visit | Attending: Vascular Surgery | Admitting: Vascular Surgery

## 2016-11-02 ENCOUNTER — Other Ambulatory Visit: Payer: Self-pay

## 2016-11-02 ENCOUNTER — Encounter (HOSPITAL_COMMUNITY)
Admission: RE | Disposition: A | Discharge status: Home / Self Care | Payer: Self-pay | Source: Ambulatory Visit | Attending: Vascular Surgery

## 2016-11-02 ENCOUNTER — Encounter (INDEPENDENT_AMBULATORY_CARE_PROVIDER_SITE_OTHER): Payer: Self-pay | Admitting: Orthopedic Surgery

## 2016-11-02 DIAGNOSIS — Z8249 Family history of ischemic heart disease and other diseases of the circulatory system: Secondary | ICD-10-CM | POA: Diagnosis not present

## 2016-11-02 DIAGNOSIS — Z48812 Encounter for surgical aftercare following surgery on the circulatory system: Secondary | ICD-10-CM

## 2016-11-02 DIAGNOSIS — Z89412 Acquired absence of left great toe: Secondary | ICD-10-CM | POA: Insufficient documentation

## 2016-11-02 DIAGNOSIS — Z992 Dependence on renal dialysis: Secondary | ICD-10-CM | POA: Insufficient documentation

## 2016-11-02 DIAGNOSIS — Z89411 Acquired absence of right great toe: Secondary | ICD-10-CM | POA: Diagnosis not present

## 2016-11-02 DIAGNOSIS — E1152 Type 2 diabetes mellitus with diabetic peripheral angiopathy with gangrene: Secondary | ICD-10-CM | POA: Insufficient documentation

## 2016-11-02 DIAGNOSIS — F1290 Cannabis use, unspecified, uncomplicated: Secondary | ICD-10-CM | POA: Diagnosis not present

## 2016-11-02 DIAGNOSIS — Z87891 Personal history of nicotine dependence: Secondary | ICD-10-CM | POA: Insufficient documentation

## 2016-11-02 DIAGNOSIS — Z794 Long term (current) use of insulin: Secondary | ICD-10-CM | POA: Diagnosis not present

## 2016-11-02 DIAGNOSIS — E1122 Type 2 diabetes mellitus with diabetic chronic kidney disease: Secondary | ICD-10-CM | POA: Diagnosis not present

## 2016-11-02 DIAGNOSIS — I5042 Chronic combined systolic (congestive) and diastolic (congestive) heart failure: Secondary | ICD-10-CM | POA: Diagnosis not present

## 2016-11-02 DIAGNOSIS — I70263 Atherosclerosis of native arteries of extremities with gangrene, bilateral legs: Secondary | ICD-10-CM | POA: Insufficient documentation

## 2016-11-02 DIAGNOSIS — N186 End stage renal disease: Secondary | ICD-10-CM | POA: Insufficient documentation

## 2016-11-02 DIAGNOSIS — I132 Hypertensive heart and chronic kidney disease with heart failure and with stage 5 chronic kidney disease, or end stage renal disease: Secondary | ICD-10-CM | POA: Diagnosis not present

## 2016-11-02 DIAGNOSIS — I2510 Atherosclerotic heart disease of native coronary artery without angina pectoris: Secondary | ICD-10-CM | POA: Insufficient documentation

## 2016-11-02 DIAGNOSIS — I739 Peripheral vascular disease, unspecified: Secondary | ICD-10-CM

## 2016-11-02 HISTORY — PX: PERIPHERAL VASCULAR ATHERECTOMY: CATH118256

## 2016-11-02 HISTORY — PX: LOWER EXTREMITY ANGIOGRAPHY: CATH118251

## 2016-11-02 HISTORY — PX: ABDOMINAL AORTOGRAM: CATH118222

## 2016-11-02 LAB — POCT I-STAT, CHEM 8
BUN: 35 mg/dL — AB (ref 6–20)
Calcium, Ion: 0.88 mmol/L — CL (ref 1.15–1.40)
Chloride: 91 mmol/L — ABNORMAL LOW (ref 101–111)
Creatinine, Ser: 14.8 mg/dL — ABNORMAL HIGH (ref 0.61–1.24)
GLUCOSE: 131 mg/dL — AB (ref 65–99)
HCT: 29 % — ABNORMAL LOW (ref 39.0–52.0)
HEMOGLOBIN: 9.9 g/dL — AB (ref 13.0–17.0)
Potassium: 3.2 mmol/L — ABNORMAL LOW (ref 3.5–5.1)
Sodium: 134 mmol/L — ABNORMAL LOW (ref 135–145)
TCO2: 27 mmol/L (ref 0–100)

## 2016-11-02 LAB — POCT ACTIVATED CLOTTING TIME
ACTIVATED CLOTTING TIME: 180 s
Activated Clotting Time: 197 seconds

## 2016-11-02 LAB — GLUCOSE, CAPILLARY: Glucose-Capillary: 131 mg/dL — ABNORMAL HIGH (ref 65–99)

## 2016-11-02 SURGERY — ABDOMINAL AORTOGRAM
Anesthesia: LOCAL

## 2016-11-02 MED ORDER — HEPARIN SODIUM (PORCINE) 1000 UNIT/ML IJ SOLN
INTRAMUSCULAR | Status: AC
  Filled 2016-11-02: qty 1

## 2016-11-02 MED ORDER — LABETALOL HCL 5 MG/ML IV SOLN: 20.0000 mg | Freq: Once | INTRAVENOUS | Status: DC

## 2016-11-02 MED ORDER — MORPHINE SULFATE (PF) 4 MG/ML IV SOLN
INTRAVENOUS | Status: AC
  Filled 2016-11-02: qty 1

## 2016-11-02 MED ORDER — FENTANYL CITRATE (PF) 100 MCG/2ML IJ SOLN
INTRAMUSCULAR | Status: AC
  Filled 2016-11-02: qty 2

## 2016-11-02 MED ORDER — MIDAZOLAM HCL 2 MG/2ML IJ SOLN
INTRAMUSCULAR | Status: DC | PRN
  Administered 2016-11-02 (×2): 1 mg via INTRAVENOUS

## 2016-11-02 MED ORDER — OXYCODONE-ACETAMINOPHEN 5-325 MG PO TABS
1.0000 | ORAL_TABLET | ORAL | Status: DC | PRN
  Administered 2016-11-02: 2 via ORAL

## 2016-11-02 MED ORDER — IODIXANOL 320 MG/ML IV SOLN
INTRAVENOUS | Status: DC | PRN
  Administered 2016-11-02: 160 mL via INTRAVENOUS

## 2016-11-02 MED ORDER — MIDAZOLAM HCL 2 MG/2ML IJ SOLN
INTRAMUSCULAR | Status: AC
  Filled 2016-11-02: qty 2

## 2016-11-02 MED ORDER — HEPARIN SODIUM (PORCINE) 1000 UNIT/ML IJ SOLN
INTRAMUSCULAR | Status: DC | PRN
  Administered 2016-11-02: 3000 [IU] via INTRAVENOUS
  Administered 2016-11-02: 8000 [IU] via INTRAVENOUS

## 2016-11-02 MED ORDER — VERAPAMIL HCL 2.5 MG/ML IV SOLN
INTRAVENOUS | Status: AC
  Filled 2016-11-02: qty 2

## 2016-11-02 MED ORDER — HEPARIN (PORCINE) IN NACL 2-0.9 UNIT/ML-% IJ SOLN
INTRAMUSCULAR | Status: DC | PRN
  Administered 2016-11-02: 1000 mL

## 2016-11-02 MED ORDER — VIPERSLIDE LUBRICANT OPTIME
TOPICAL | Status: DC | PRN
  Administered 2016-11-02: 09:00:00 via SURGICAL_CAVITY

## 2016-11-02 MED ORDER — MORPHINE SULFATE (PF) 4 MG/ML IV SOLN
4.0000 mg | INTRAVENOUS | Status: DC | PRN
  Administered 2016-11-02: 4 mg via INTRAVENOUS

## 2016-11-02 MED ORDER — CLOPIDOGREL BISULFATE 75 MG PO TABS: 75.0000 mg | ORAL_TABLET | Freq: Every day | ORAL | 3 refills | Status: DC

## 2016-11-02 MED ORDER — NITROGLYCERIN IN D5W 200-5 MCG/ML-% IV SOLN
INTRAVENOUS | Status: AC
  Filled 2016-11-02: qty 250

## 2016-11-02 MED ORDER — FENTANYL CITRATE (PF) 100 MCG/2ML IJ SOLN
INTRAMUSCULAR | Status: DC | PRN
  Administered 2016-11-02 (×2): 100 ug via INTRAVENOUS
  Administered 2016-11-02: 50 ug via INTRAVENOUS

## 2016-11-02 MED ORDER — CLOPIDOGREL BISULFATE 75 MG PO TABS
300.0000 mg | ORAL_TABLET | Freq: Once | ORAL | Status: AC
  Administered 2016-11-02: 300 mg via ORAL

## 2016-11-02 MED ORDER — CLOPIDOGREL BISULFATE 300 MG PO TABS
ORAL_TABLET | ORAL | Status: AC
  Filled 2016-11-02: qty 1

## 2016-11-02 MED ORDER — CLOPIDOGREL BISULFATE 75 MG PO TABS: 300.0000 mg | ORAL_TABLET | Freq: Every day | ORAL | Status: DC

## 2016-11-02 MED ORDER — LIDOCAINE HCL (PF) 1 % IJ SOLN
INTRAMUSCULAR | Status: DC | PRN
  Administered 2016-11-02: 15 mL

## 2016-11-02 MED ORDER — SODIUM CHLORIDE 0.9% FLUSH: 3.0000 mL | INTRAVENOUS | Status: DC | PRN

## 2016-11-02 MED ORDER — OXYCODONE-ACETAMINOPHEN 5-325 MG PO TABS
ORAL_TABLET | ORAL | Status: AC
  Administered 2016-11-02: 2 via ORAL
  Filled 2016-11-02: qty 2

## 2016-11-02 MED ORDER — HEPARIN (PORCINE) IN NACL 2-0.9 UNIT/ML-% IJ SOLN
INTRAMUSCULAR | Status: AC
  Filled 2016-11-02: qty 1000

## 2016-11-02 MED ORDER — LIDOCAINE HCL (PF) 1 % IJ SOLN
INTRAMUSCULAR | Status: AC
  Filled 2016-11-02: qty 30

## 2016-11-02 MED ORDER — SODIUM CHLORIDE 0.9 % IV SOLN: 250.0000 mL | INTRAVENOUS | Status: DC | PRN

## 2016-11-02 MED ORDER — SODIUM CHLORIDE 0.9% FLUSH: 3.0000 mL | Freq: Two times a day (BID) | INTRAVENOUS | Status: DC

## 2016-11-02 SURGICAL SUPPLY — 18 items
BALLN ARMADA 2.5X200X150 (CATHETERS) ×4
BALLOON ARMADA 2.5X200X150 (CATHETERS) IMPLANT
CATH CXI SUPP ST 2.6FR 150CM (CATHETERS) ×1 IMPLANT
CATH OMNI FLUSH 5F 65CM (CATHETERS) ×1 IMPLANT
CATH SOFT-VU ST 4F 90CM (CATHETERS) ×1 IMPLANT
CROWN STEALTH MICRO-30 1.25MM (CATHETERS) ×1 IMPLANT
GUIDEWIRE STR TIP .014X300X8 (WIRE) ×1 IMPLANT
KIT ENCORE 26 ADVANTAGE (KITS) ×1 IMPLANT
KIT PV (KITS) ×4 IMPLANT
SHEATH HIGHFLEX ANSEL 6FRX55 (SHEATH) ×1 IMPLANT
SHEATH PINNACLE 5F 10CM (SHEATH) ×1 IMPLANT
SYR MEDRAD MARK V 150ML (SYRINGE) ×4 IMPLANT
TRANSDUCER W/STOPCOCK (MISCELLANEOUS) ×4 IMPLANT
TRAY PV CATH (CUSTOM PROCEDURE TRAY) ×4 IMPLANT
WIRE BENTSON .035X145CM (WIRE) ×1 IMPLANT
WIRE G V18X300CM (WIRE) ×1 IMPLANT
WIRE MINI STICK MAX (SHEATH) ×1 IMPLANT
WIRE VIPER ADVANCE .017X335CM (WIRE) ×1 IMPLANT

## 2016-11-02 NOTE — Op Note (Signed)
Patient name: [name] Rueter Sr. MRN: [id number] DOB: May 02, 1976 Sex: male  11/02/2016 Pre-operative Diagnosis: critical limb ischemia bilaterally with gangrene of bilateral lower extremities, impending amputation of left foot Post-operative diagnosis:  Same Surgeon:  Eda Paschal. Donzetta Matters, MD Procedure Performed: 1.  US guided cannulation of right common femoral artery 2.  Aortogram with bilateral lower extremity runoff 3.  Atherectomy with csi 1.25 micro and balloon angioplasty with 2.70mm balloon of left peroneal artery 4.  Moderate sedation with fentanyl and versed for 69 minutes   Indications:  This is a 41 year old male who has a history of bilateral toe amputations now has gangrene of his remaining toes on his left foot. He is end-stage renal and is also diabetic. He is indicated for angiogram possible intervention for limb salvage.  Findings: The common femoral artery on the right was calcified and gaining access. His aorta and iliac segments were patent bilaterally. Calcified. His bilateral SFA popliteals were also calcified but patent throughout. On the left side he had heavily diseased PT and peroneal runoff to the level of the foot with intermittent occlusions and high-grade stenoses. There was one short segment of peroneal artery occlusion on the left and after intervention there was no further stenoses of the peroneal or flow-limiting dissection with runoff filling mainly the medial plantar artery. On the patient's right side there was 3 vessels of the trifurcation. Dominant flow via the PT and peroneal artery but was incompletely visualized due to timing of the contrast.  He should be okay for intervention on his left foot by [name] to but will need further angiographic evaluation of his right lower extremity prior to any foot intervention on that side.    Procedure:  The patient was identified in the holding area and taken to room 8.  The patient was then placed supine on the  table and prepped and draped in the usual sterile fashion.  A time out was called.  Ultrasound was used to evaluate the right common femoral artery.  It was patent .  A digital ultrasound image was acquired.  A micropuncture needle was used to access the right common femoral artery under ultrasound guidance.  An 018 wire was advanced without resistance and a micropuncture sheath was placed.  The 018 wire was removed and a benson wire was placed.  The micropuncture sheath was exchanged for a 5 french sheath.  An omniflush catheter was advanced over the wire to the level of L-1.  An abdominal angiogram was obtained followed by bilateral lower extremity runoff. With this we crossed over the bifurcation with Bentson wire Omni Flush catheter performed left lower extremity angiography with the above findings. With this we placed a long 6 French sheath and the patient was heparinized. We then used V 18 wire and CXI catheter to cross his multiple stenoses and occlusive disease in his peroneal artery on the left. We then confirmed intraluminal access at the level of the ankle demonstrating flow in the medial and lateral plantar arteries. We then exchanged for a viper wire. CSI atherectomy was then performed with 1.25 micro-catheter. We then performed balloon angioplasty of the entire peroneal artery with 2.5 mm balloon. Completion angiogram demonstrated no flow dissection stenoses or residual stenosis >30%. Satisfied with this catheter wire were removed and sheath withdrawn to right external iliac artery. This will be pulled in the postoperative holding area when ACT <180.  Contrast 160cc  [name] C. Donzetta Matters, MD [facility] Office:  [phone] Pager: [phone]

## 2016-11-02 NOTE — Progress Notes (Signed)
6FR sheath aspirated and removed from RFA. Manual pressure applied for 20 minutes. Groin level 0, no s+s of hematoma. Tegaderm dressing applied, bedrest instructions given.  Bedrest begins at 12:00:00

## 2016-11-02 NOTE — Telephone Encounter (Signed)
-----   Message from [name], MD sent at 11/02/2016 11:25 AM EDT ----- Arloa Koh get patient in tomorrow for surgery friday ----- Message ----- From: Waynetta Sandy, MD Sent: 11/02/2016  10:23 AM To: [name], MD  This patient should have sufficient blood flow for left foot intervention, will need further work on right foot prior to intervention if needed.   brandon

## 2016-11-02 NOTE — Discharge Instructions (Signed)
Femoral Site Care °Refer to this sheet in the next few weeks. These instructions provide you with information about caring for yourself after your procedure. Your health care provider may also give you more specific instructions. Your treatment has been planned according to current medical practices, but problems sometimes occur. Call your health care provider if you have any problems or questions after your procedure. °What can I expect after the procedure? °After your procedure, it is typical to have the following: °· Bruising at the site that usually fades within 1-2 weeks. °· Blood collecting in the tissue (hematoma) that may be painful to the touch. It should usually decrease in size and tenderness within 1-2 weeks. °Follow these instructions at home: °· Take medicines only as directed by your health care provider. °· You may shower 24-48 hours after the procedure or as directed by your health care provider. Remove the bandage (dressing) and gently wash the site with plain soap and water. Pat the area dry with a clean towel. Do not rub the site, because this may cause bleeding. °· Do not take baths, swim, or use a hot tub until your health care provider approves. °· Check your insertion site every day for redness, swelling, or drainage. °· Do not apply powder or lotion to the site. °· Limit use of stairs to twice a day for the first 2-3 days or as directed by your health care provider. °· Do not squat for the first 2-3 days or as directed by your health care provider. °· Do not lift over 10 lb (4.5 kg) for 5 days after your procedure or as directed by your health care provider. °· Ask your health care provider when it is okay to: °¨ Return to work or school. °¨ Resume usual physical activities or sports. °¨ Resume sexual activity. °· Do not drive home if you are discharged the same day as the procedure. Have someone else drive you. °· You may drive 24 hours after the procedure unless otherwise instructed by  your health care provider. °· Do not operate machinery or power tools for 24 hours after the procedure or as directed by your health care provider. °· If your procedure was done as an outpatient procedure, which means that you went home the same day as your procedure, a responsible adult should be with you for the first 24 hours after you arrive home. °· Keep all follow-up visits as directed by your health care provider. This is important. °Contact a health care provider if: °· You have a fever. °· You have chills. °· You have increased bleeding from the site. Hold pressure on the site. °Get help right away if: °· You have unusual pain at the site. °· You have redness, warmth, or swelling at the site. °· You have drainage (other than a small amount of blood on the dressing) from the site. °· The site is bleeding, and the bleeding does not stop after 30 minutes of holding steady pressure on the site. °· Your leg or foot becomes pale, cool, tingly, or numb. °This information is not intended to replace advice given to you by your health care provider. Make sure you discuss any questions you have with your health care provider. °Document Released: 04/05/2014 Document Revised: 01/08/2016 Document Reviewed: 02/19/2014 °Elsevier Interactive Patient Education © 2017 [employer] Inc. ° °

## 2016-11-02 NOTE — Telephone Encounter (Signed)
Called pt to make appt for tomorrow and he states that he is not ale to make it that he has dialysis on m,w,f and can not come tomorrow. appt was made for thursday

## 2016-11-02 NOTE — H&P (Signed)
[name]    History of Present Illness: This is a 41 y.o. male with history of esrd, dm and previous left great toe amputation. Now has gangrene of remaining toes on left foot and well as ischemic changes of right toes. He is on abx at dialysis. Having significant pain in left foot. Denies any fevers. Does not take blood thinners.  Past Medical History:  Diagnosis Date  . Atherosclerosis of lower extremity ([place])   . CAD (coronary artery disease)   . Chronic combined systolic and diastolic heart failure ([place])   . Diabetes mellitus   . Hypertension   . Renal disorder dialysis   [name]    Past Surgical History:  Procedure Laterality Date  . AMPUTATION Left 09/27/2013   Procedure: LEFT GREAT TOE AMPUTATION;  Surgeon: [name], MD;  Location: [place];  Service: Orthopedics;  Laterality: Left;  . AMPUTATION Right 08/15/2015   Procedure: Right Great Toe Amputation;  Surgeon: [name], MD;  Location: [place];  Service: Orthopedics;  Laterality: Right;  . AV FISTULA PLACEMENT  left arm  . LEFT HEART CATHETERIZATION WITH CORONARY ANGIOGRAM N/A 09/13/2014   Procedure: LEFT HEART CATHETERIZATION WITH CORONARY ANGIOGRAM;  Surgeon: Sinclair Grooms, MD;  [facility];  Service: Cardiovascular;  Laterality: N/A;    Allergies  Allergen Reactions  . Coconut Oil Anaphylaxis    Can use topically,    Prior to Admission medications   Medication Sig Start Date End Date Taking? Authorizing Provider  acetaminophen (TYLENOL) 500 MG tablet Take 1,000 mg by mouth every 6 (six) hours as needed for mild pain.   Yes Historical Provider, MD  amLODipine (NORVASC) 10 MG tablet Take 1 tablet (10 mg total) by mouth at bedtime. 08/16/15  Yes [name], MD  calcium acetate (PHOSLO) 667 MG capsule Take 1 capsule (667 mg total) by mouth 3 (three) times daily with meals. 08/16/15  Yes [name], MD  Ceftazidime (FORTAZ) 2 g SOLR injection Inject 2 g into the vein every Monday,  Wednesday, and Friday.   Yes Historical Provider, MD  insulin glargine (LANTUS) 100 UNIT/ML injection Inject 0.1 mLs (10 Units total) into the skin at bedtime. Patient taking differently: Inject 12 Units into the skin at bedtime.  09/15/14  Yes [name], DO  lanthanum (FOSRENOL) 1000 MG chewable tablet Chew 2 tablets (2,000 mg total) by mouth 3 (three) times daily with meals. Patient taking differently: Chew 2,000 mg by mouth 3 (three) times daily with meals. May take an additional 1000 - 2000 mgs with snacks 08/16/15  Yes [name], MD  vancomycin 1,000 mg in sodium chloride 0.9 % 250 mL Inject 1,000 mg into the vein every Monday, Wednesday, and Friday.   Yes Historical Provider, MD  doxycycline (VIBRAMYCIN) 100 MG capsule Take 1 capsule (100 mg total) by mouth 2 (two) times daily. Patient not taking: Reported on 10/29/2016 09/11/16   Montine Circle, PA-C  gabapentin (NEURONTIN) 100 MG capsule Take 3 capsules (300 mg total) by mouth 3 (three) times daily. Patient not taking: Reported on 10/29/2016 06/16/16   [name], DPM  glucagon (GLUCAGON EMERGENCY) 1 MG injection Inject 1 mg into the vein once as needed (low blood sugar and inability to eat or drink sugar). 08/16/15   [name], MD  glucose 4 GM chewable tablet Chew 1 tablet (4 g total) by mouth as needed for low blood sugar. 08/16/15   [name], MD  HYDROcodone-acetaminophen (NORCO/VICODIN) [phone]  MG tablet Take 1-2 tablets by mouth every 6 (six) hours as needed. Patient not taking: Reported on 10/29/2016 09/11/16   Montine Circle, PA-C  oxyCODONE-acetaminophen (ROXICET) 5-325 MG tablet Take 1 tablet by mouth every 8 (eight) hours as needed for severe pain. Patient not taking: Reported on 10/29/2016 08/04/16   [name], DPM  senna-docusate (SENOKOT-S) 8.6-50 MG tablet Take 2 tablets by mouth 2 (two) times daily as needed for mild constipation. 08/16/15   [name], MD  sulfamethoxazole-trimethoprim (BACTRIM  DS,SEPTRA DS) 800-160 MG tablet Take 1 tablet by mouth 2 (two) times daily. Patient not taking: Reported on 10/29/2016 08/18/16   [name], DPM    Social History   Social History  . Marital status: Legally Separated    Spouse name: N/A  . Number of children: N/A  . Years of education: N/A   Occupational History  . Not on file.   Social History Main Topics  . Smoking status: Former Smoker    Packs/day: 0.25    Years: 0.00    Types: Cigarettes    Quit date: 09/27/2014  . Smokeless tobacco: Never Used  . Alcohol use Yes  . Drug use: Yes    Types: Marijuana  . Sexual activity: Not on file   Other Topics Concern  . Not on file   Social History Narrative  . No narrative on file     Family History  Problem Relation Age of Onset  . Heart failure Mother   . Hypertension Mother       Physical Examination  Vitals:   11/02/16 0728  BP: (!) 158/57  Pulse: 78  Resp: 18  Temp: 97.6 F (36.4 C)   Body mass index is 24.39 kg/m.  General:  WDWN in NAD HENT: WNL, normocephalic Pulmonary: normal non-labored breathing Cardiac: palpable bilateral femoral pulses Abdomen: soft, NT/ND, no masses Extremities: gangrene of left 4 remaining toes  Neurologic: A&O X 3; Appropriate Affect ; SENSATION: normal; MOTOR FUNCTION:  moving all extremities equally. Speech is fluent/normal   CBC    Component Value Date/Time   WBC 10.2 09/11/2016 0055   RBC 3.74 (L) 09/11/2016 0055   HGB 9.9 (L) 11/02/2016 0759   HCT 29.0 (L) 11/02/2016 0759   PLT 542 (H) 09/11/2016 0055   MCV 82.9 09/11/2016 0055   MCH 26.7 09/11/2016 0055   MCHC 32.3 09/11/2016 0055   RDW 16.6 (H) 09/11/2016 0055   LYMPHSABS 2.1 09/11/2016 0055   MONOABS 0.6 09/11/2016 0055   EOSABS 0.3 09/11/2016 0055   BASOSABS 0.0 09/11/2016 0055    BMET    Component Value Date/Time   NA 134 (L) 11/02/2016 0759   K 3.2 (L) 11/02/2016 0759   CL 91 (L) 11/02/2016 0759   CO2 29 09/11/2016 0055   GLUCOSE 131 (H)  11/02/2016 0759   GLUCOSE 262 03/06/2008   BUN 35 (H) 11/02/2016 0759   CREATININE 14.80 (H) 11/02/2016 0759   CALCIUM 7.8 (L) 09/11/2016 0055   GFRNONAA 6 (L) 09/11/2016 0055   GFRAA 7 (L) 09/11/2016 0055    COAGS: Lab Results  Component Value Date   INR 1.05 09/13/2014   INR 1.05 09/27/2013      ASSESSMENT/PLAN: This is a 41 y.o. male with gangrene of left foot. Angiogram today with possible intervention today. Likely will need left tma depending on results.   Brandon C. Donzetta Matters, MD [facility] [place] Office: [phone] Pager: [phone]

## 2016-11-03 ENCOUNTER — Encounter (HOSPITAL_COMMUNITY): Payer: Self-pay | Admitting: Vascular Surgery

## 2016-11-03 ENCOUNTER — Telehealth: Payer: Self-pay | Admitting: Vascular Surgery

## 2016-11-03 LAB — POCT ACTIVATED CLOTTING TIME
ACTIVATED CLOTTING TIME: 208 s
Activated Clotting Time: 230 seconds

## 2016-11-03 NOTE — Telephone Encounter (Signed)
-----   Message from Denman George, RN sent at 11/02/2016  4:59 PM EDT ----- Regarding: needs 1 mo. f/u with Dr. 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 Donzetta Matters, MD  Procedure Performed: 1.  US guided cannulation of right common femoral artery 2.  Aortogram with bilateral lower extremity runoff 3.  Atherectomy with csi 1.25 micro and balloon angioplasty with 2.72mm balloon of left peroneal artery 4.  Moderate sedation with fentanyl and versed for 69 minutes   f/u in 1 month with abi

## 2016-11-03 NOTE — Telephone Encounter (Signed)
Sched lab 12/03/16 at 4:00 and MD 12/11/16 at 3:45. Spoke to pt to inform them of appt.

## 2016-11-04 ENCOUNTER — Encounter (INDEPENDENT_AMBULATORY_CARE_PROVIDER_SITE_OTHER): Payer: Self-pay | Admitting: Orthopedic Surgery

## 2016-11-04 ENCOUNTER — Other Ambulatory Visit (INDEPENDENT_AMBULATORY_CARE_PROVIDER_SITE_OTHER): Payer: Self-pay | Admitting: Family

## 2016-11-04 ENCOUNTER — Encounter (HOSPITAL_COMMUNITY): Payer: Self-pay | Admitting: *Deleted

## 2016-11-04 ENCOUNTER — Ambulatory Visit (INDEPENDENT_AMBULATORY_CARE_PROVIDER_SITE_OTHER): Payer: Medicaid Other | Admitting: Orthopedic Surgery

## 2016-11-04 VITALS — Ht 74.0 in | Wt 190.0 lb

## 2016-11-04 DIAGNOSIS — I96 Gangrene, not elsewhere classified: Secondary | ICD-10-CM

## 2016-11-04 MED ORDER — OXYCODONE-ACETAMINOPHEN 5-325 MG PO TABS: 1.0000 | ORAL_TABLET | ORAL | 0 refills | Status: DC | PRN

## 2016-11-04 NOTE — Progress Notes (Signed)
I instructed patient to check CBG to check CBG and if it is less than 70 to treat it with 4 Glucose tablets. I instructed patient to recheck CBG in 15 minutes and if CBG is not greater than 70, to  Call [phone] (pre- op). If it is before pre-op opens to retreat as before and recheck CBG in 15 minutes. I told patient to make note of time that liquid is taken and amount, that surgical time may have to be adjusted.   I instructed patient to take only 6 uints Lantus tonight.

## 2016-11-04 NOTE — Progress Notes (Signed)
Office Visit Note   Patient: [name] Rothermel Sr.           Date of Birth: [birth year]           MRN: [id number] Visit Date: 11/04/2016              Requested by: Mauricia Area, MD [address] [place],  [address] PCP: Placido Sou, MD  Chief Complaint  Patient presents with  . Left Foot - Follow-up    To discuss surgery s/p vascular studies    HPI: Patient states he just underwent revascularization on 11/02/2016. Patient feels like his foot is doing much better now. Patient complains of chronic foot pain from ischemic changes.  Assessment & Plan: Visit Diagnoses:  1. Gangrene of left foot ([place])     Plan: We'll plan for transmetatarsal amputation the left. Risk and benefits were discussed including risk of the wound not healing need for additional surgery. Patient states he understands wishes to proceed. Patient was given a prescription for Percocet for pain at this time plan for outpatient surgery.  Follow-Up Instructions: Return in about 1 week (around 11/11/2016).   Ortho Exam  Patient is alert, oriented, no adenopathy, well-dressed, normal affect, normal respiratory effort. Patient has an antalgic gait. Semination patient has 2 small ischemic ulcers on the lateral aspect the left heel which are about 5 mm in diameter these are tender but there is no cellulitis no drainage no odor this does not probe to bone or tendon. Patient has dry gangrene involving toes 34 and 5 of the left foot. There is no cellulitis no drainage no odor. Patient has a palpable dorsalis pedis pulse.  Imaging: No results found.  Labs: Lab Results  Component Value Date   HGBA1C 6.1 (H) 08/15/2015   HGBA1C 5.9 (H) 09/12/2014   HGBA1C (H) 01/30/2010    8.0 (NOTE)                                                                       According to the ADA Clinical Practice Recommendations for 2011, when HbA1c is used as a screening test:   >=6.5%   Diagnostic of Diabetes Mellitus            (if abnormal result  is confirmed)  5.7-6.4%   Increased risk of developing Diabetes Mellitus  References:Diagnosis and Classification of Diabetes Mellitus,Diabetes GYJE,5631,49(FWYOV 1):S62-S69 and Standards of Medical Care in         Diabetes - 2011,Diabetes Care,2011,34  (Suppl 1):S11-S61.   ESRSEDRATE 94 (H) 08/15/2015   CRP 5.2 (H) 08/15/2015   REPTSTATUS 08/20/2015 FINAL 08/15/2015   GRAMSTAIN  08/08/2013    FEW WBC PRESENT, PREDOMINANTLY PMN RARE SQUAMOUS EPITHELIAL CELLS PRESENT MODERATE GRAM POSITIVE COCCI IN PAIRS IN CLUSTERS Performed at [facility]   CULT NO GROWTH 5 DAYS 08/15/2015   LABORGA METHICILLIN RESISTANT STAPHYLOCOCCUS AUREUS 08/08/2013    Orders:  No orders of the defined types were placed in this encounter.  Meds ordered this encounter  Medications  . oxyCODONE-acetaminophen (PERCOCET/ROXICET) 5-325 MG tablet    Sig: Take 1 tablet by mouth every 4 (four) hours as needed for severe pain.    Dispense:  40 tablet    Refill:  0  Procedures: No procedures performed  Clinical Data: No additional findings.  ROS: Review of Systems  Constitutional: Negative for chills and fever.  All other systems reviewed and are negative.   Objective: Vital Signs: Ht 6\' 2"  (1.88 m)   Wt 190 lb (86.2 kg)   BMI 24.39 kg/m   Specialty Comments:  No specialty comments available.  PMFS History: Patient Active Problem List   Diagnosis Date Noted  . Gangrene of left foot ([place]) 08/02/2016  . Gangrene of right foot ([place]) 08/02/2016  . Achilles tendon contracture, bilateral 08/02/2016  . Anemia of renal disease 08/16/2015  . Wound infection 08/15/2015  . Diabetic ulcer of right great toe ([place]) 08/15/2015  . Pain in the chest   . Elevated troponin   . Essential hypertension   . 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 Diabetic foot ulcer ([place]) 08/07/2013  . ESRD on dialysis ([place]) 08/07/2013  . Diabetes ([place]) 08/07/2013  . Chest pain  05/16/2012  . Murmur 05/16/2012  . Renal disorder    Past Medical History:  Diagnosis Date  . Atherosclerosis of lower extremity ([place])   . CAD (coronary artery disease)   . Chronic combined systolic and diastolic heart failure ([place])   . Diabetes mellitus   . Hypertension   . Renal disorder dialysis   MWF [facility]    Family History  Problem Relation Age of Onset  . Heart failure Mother   . Hypertension Mother     Past Surgical History:  Procedure Laterality Date  . ABDOMINAL AORTOGRAM N/A 11/02/2016   Procedure: Abdominal Aortogram;  Surgeon: Waynetta Sandy, MD;  Location: [place] CV LAB;  Service: Cardiovascular;  Laterality: N/A;  . AMPUTATION Left 09/27/2013   Procedure: LEFT GREAT TOE AMPUTATION;  Surgeon: [name], MD;  Location: [place];  Service: Orthopedics;  Laterality: Left;  . AMPUTATION Right 08/15/2015   Procedure: Right Great Toe Amputation;  Surgeon: [name], MD;  Location: [place];  Service: Orthopedics;  Laterality: Right;  . AV FISTULA PLACEMENT  left arm  . LEFT HEART CATHETERIZATION WITH CORONARY ANGIOGRAM N/A 09/13/2014   Procedure: LEFT HEART CATHETERIZATION WITH CORONARY ANGIOGRAM;  Surgeon: Sinclair Grooms, MD;  [facility] C[facility] LAB;  Service: Cardiovascular;  Laterality: N/A;  . LOWER EXTREMITY ANGIOGRAPHY Bilateral 11/02/2016   Procedure: Lower Extremity Angiography;  Surgeon: Waynetta Sandy, MD;  Location: [place] CV LAB;  Service: Cardiovascular;  Laterality: Bilateral;  . PERIPHERAL VASCULAR ATHERECTOMY Left 11/02/2016   Procedure: Peripheral Vascular Atherectomy;  Surgeon: Waynetta Sandy, MD;  Location: [place] CV LAB;  Service: Cardiovascular;  Laterality: Left;  PERONEAL   Social History   Occupational History  . Not on file.   Social History Main Topics  . Smoking status: Former Smoker    Packs/day: 0.25    Years: 0.00    Types: Cigarettes    Quit date: 09/27/2014  . Smokeless tobacco:  Never Used  . Alcohol use Yes  . Drug use: Yes    Types: Marijuana  . Sexual activity: Not on file

## 2016-11-04 NOTE — Progress Notes (Signed)
Anesthesia Chart Review:  Pt is a same day work up.   Pt is a 41 year old male scheduled for L transmetatarsal foot amputation on 11/05/2016 with Meridee Score, M.D.  [facility] includes:  CAD, chronic combined systolic and diastolic CHF, HTN, DM, heart murmur, ESRD on hemodialysis, postop N/V. Current smoker. BMI 24.   Medications include: Amlodipine, Plavix, Lantus, vancomycin  Labs will be obtained DOS.   EKG 11/02/16: NSR. Possible LAE. ST & T wave abnormality, consider inferolateral ischemia. Prolonged QT - Will repeat DOS  EKG 09/15/14:  - Left ventricle: The cavity size was normal. Wall thickness wasincreased in a pattern of moderate LVH. Systolic function wasnormal. The estimated ejection fraction was in the range of 50%to 55%. Wall motion was normal; there were no regional wallmotion abnormalities. Features are consistent with a pseudonormalleft ventricular filling pattern, with concomitant abnormalrelaxation and increased filling pressure (grade 2 diastolicdysfunction). - Aortic valve: Valve area (VTI): 3.53 cm^2. Valve area (Vmax):3.37 cm^2. Valve area (Vmean): 3.2 cm^2. - Left atrium: The atrium was moderately to severely dilated. - Atrial septum: The septum bowed from left to right, consistentwith increased left atrial pressure.  Cardiac cath 09/13/14:  1. 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 Etiology is uncertain but likely hypertension related. 2. Heavily calcified but widely patent coronary arteries. The mid to distal LAD contains an eccentric 50-60%, and the continuation of the circumflex beyond the second marginal contains 90% stenosis. The circumflex beyond the second marginal is very small in distribution and is not clinically relevant. 3. Prolonged chest pain of uncertain etiology 4. Elevated troponin related to end-stage renal disease and  cardiomyopathy.  Pt is a same day work up added for tomorrow later in the day.  I do not know if he sees cardiology outside of our system; last office visit in [place] with cardiology 10/04/14 with Jenkins Rouge, MD.  EKG 2 days ago prior to abdominal aortogram, lower extremity angiography and peripheral vascular atherectomy is changed from previous and shows critical prolonged QT and new T wave changes indicating possible inferolateral ischemia.  Will repeat EKG DOS.   Pt will need further assessment DOS by assigned anesthesiologist.   Willeen Cass, FNP-BC [facility]/Anesthesiology Phone: [phone] 11/04/2016 4:55 PM

## 2016-11-05 ENCOUNTER — Ambulatory Visit (HOSPITAL_COMMUNITY)
Admission: RE | Admit: 2016-11-05 | Discharge: 2016-11-05 | Disposition: A | Discharge status: Home / Self Care | Payer: Medicaid Other | Source: Ambulatory Visit | Attending: Orthopedic Surgery | Admitting: Orthopedic Surgery

## 2016-11-05 ENCOUNTER — Encounter (HOSPITAL_COMMUNITY)
Admission: RE | Disposition: A | Discharge status: Home / Self Care | Payer: Self-pay | Source: Ambulatory Visit | Attending: Orthopedic Surgery

## 2016-11-05 ENCOUNTER — Encounter (HOSPITAL_COMMUNITY): Payer: Self-pay | Admitting: Critical Care Medicine

## 2016-11-05 ENCOUNTER — Ambulatory Visit (HOSPITAL_COMMUNITY): Payer: Medicaid Other | Admitting: Emergency Medicine

## 2016-11-05 DIAGNOSIS — Z8249 Family history of ischemic heart disease and other diseases of the circulatory system: Secondary | ICD-10-CM | POA: Diagnosis not present

## 2016-11-05 DIAGNOSIS — R011 Cardiac murmur, unspecified: Secondary | ICD-10-CM | POA: Insufficient documentation

## 2016-11-05 DIAGNOSIS — I132 Hypertensive heart and chronic kidney disease with heart failure and with stage 5 chronic kidney disease, or end stage renal disease: Secondary | ICD-10-CM | POA: Insufficient documentation

## 2016-11-05 DIAGNOSIS — E11621 Type 2 diabetes mellitus with foot ulcer: Secondary | ICD-10-CM

## 2016-11-05 DIAGNOSIS — L97509 Non-pressure chronic ulcer of other part of unspecified foot with unspecified severity: Secondary | ICD-10-CM

## 2016-11-05 DIAGNOSIS — E1140 Type 2 diabetes mellitus with diabetic neuropathy, unspecified: Secondary | ICD-10-CM | POA: Diagnosis not present

## 2016-11-05 DIAGNOSIS — Z9102 Food additives allergy status: Secondary | ICD-10-CM | POA: Diagnosis not present

## 2016-11-05 DIAGNOSIS — E1152 Type 2 diabetes mellitus with diabetic peripheral angiopathy with gangrene: Secondary | ICD-10-CM | POA: Diagnosis not present

## 2016-11-05 DIAGNOSIS — F17210 Nicotine dependence, cigarettes, uncomplicated: Secondary | ICD-10-CM | POA: Insufficient documentation

## 2016-11-05 DIAGNOSIS — I2510 Atherosclerotic heart disease of native coronary artery without angina pectoris: Secondary | ICD-10-CM | POA: Diagnosis not present

## 2016-11-05 DIAGNOSIS — I4581 Long QT syndrome: Secondary | ICD-10-CM | POA: Diagnosis not present

## 2016-11-05 DIAGNOSIS — Z89411 Acquired absence of right great toe: Secondary | ICD-10-CM | POA: Insufficient documentation

## 2016-11-05 DIAGNOSIS — M869 Osteomyelitis, unspecified: Secondary | ICD-10-CM | POA: Diagnosis not present

## 2016-11-05 DIAGNOSIS — E1169 Type 2 diabetes mellitus with other specified complication: Secondary | ICD-10-CM | POA: Diagnosis not present

## 2016-11-05 DIAGNOSIS — I5042 Chronic combined systolic (congestive) and diastolic (congestive) heart failure: Secondary | ICD-10-CM | POA: Insufficient documentation

## 2016-11-05 DIAGNOSIS — Z992 Dependence on renal dialysis: Secondary | ICD-10-CM | POA: Diagnosis not present

## 2016-11-05 DIAGNOSIS — N186 End stage renal disease: Secondary | ICD-10-CM | POA: Insufficient documentation

## 2016-11-05 DIAGNOSIS — E1122 Type 2 diabetes mellitus with diabetic chronic kidney disease: Secondary | ICD-10-CM | POA: Insufficient documentation

## 2016-11-05 HISTORY — DX: Adverse effect of unspecified anesthetic, initial encounter: T41.45XA

## 2016-11-05 HISTORY — DX: Other specified postprocedural states: R11.2

## 2016-11-05 HISTORY — DX: Cardiac murmur, unspecified: R01.1

## 2016-11-05 HISTORY — DX: Other specified postprocedural states: Z98.890

## 2016-11-05 HISTORY — PX: AMPUTATION: SHX166

## 2016-11-05 HISTORY — DX: Personal history of other medical treatment: Z92.89

## 2016-11-05 HISTORY — DX: Other complications of anesthesia, initial encounter: T88.59XA

## 2016-11-05 LAB — GLUCOSE, CAPILLARY
Glucose-Capillary: 108 mg/dL — ABNORMAL HIGH (ref 65–99)
Glucose-Capillary: 101 mg/dL — ABNORMAL HIGH (ref 65–99)
Glucose-Capillary: 122 mg/dL — ABNORMAL HIGH (ref 65–99)

## 2016-11-05 LAB — POCT I-STAT 4, (NA,K, GLUC, HGB,HCT)
GLUCOSE: 108 mg/dL — AB (ref 65–99)
HEMATOCRIT: 28 % — AB (ref 39.0–52.0)
HEMOGLOBIN: 9.5 g/dL — AB (ref 13.0–17.0)
Potassium: 3.3 mmol/L — ABNORMAL LOW (ref 3.5–5.1)
Sodium: 135 mmol/L (ref 135–145)

## 2016-11-05 SURGERY — AMPUTATION, FOOT, PARTIAL
Anesthesia: General | Laterality: Left

## 2016-11-05 MED ORDER — FENTANYL CITRATE (PF) 100 MCG/2ML IJ SOLN
INTRAMUSCULAR | Status: AC
  Filled 2016-11-05: qty 2

## 2016-11-05 MED ORDER — TRANEXAMIC ACID 1000 MG/10ML IV SOLN
2000.0000 mg | Freq: Once | INTRAVENOUS | Status: DC
  Filled 2016-11-05: qty 20

## 2016-11-05 MED ORDER — PROPOFOL 10 MG/ML IV BOLUS
INTRAVENOUS | Status: AC
  Filled 2016-11-05: qty 20

## 2016-11-05 MED ORDER — PHENYLEPHRINE 40 MCG/ML (10ML) SYRINGE FOR IV PUSH (FOR BLOOD PRESSURE SUPPORT)
PREFILLED_SYRINGE | INTRAVENOUS | Status: AC
  Filled 2016-11-05: qty 10

## 2016-11-05 MED ORDER — 0.9 % SODIUM CHLORIDE (POUR BTL) OPTIME
TOPICAL | Status: DC | PRN
  Administered 2016-11-05: 1000 mL

## 2016-11-05 MED ORDER — LIDOCAINE 2% (20 MG/ML) 5 ML SYRINGE
INTRAMUSCULAR | Status: AC
  Filled 2016-11-05: qty 5

## 2016-11-05 MED ORDER — MIDAZOLAM HCL 2 MG/2ML IJ SOLN
INTRAMUSCULAR | Status: AC
  Filled 2016-11-05: qty 2

## 2016-11-05 MED ORDER — ONDANSETRON HCL 4 MG/2ML IJ SOLN
INTRAMUSCULAR | Status: DC | PRN
  Administered 2016-11-05: 4 mg via INTRAVENOUS

## 2016-11-05 MED ORDER — CHLORHEXIDINE GLUCONATE 4 % EX LIQD: 60.0000 mL | Freq: Once | CUTANEOUS | Status: DC

## 2016-11-05 MED ORDER — SODIUM CHLORIDE 0.9 % IV SOLN
INTRAVENOUS | Status: DC
  Administered 2016-11-05: 35 mL/h via INTRAVENOUS
  Administered 2016-11-05: 12:00:00 via INTRAVENOUS

## 2016-11-05 MED ORDER — CEFAZOLIN SODIUM-DEXTROSE 2-4 GM/100ML-% IV SOLN
2.0000 g | INTRAVENOUS | Status: AC
  Administered 2016-11-05: 2 g via INTRAVENOUS
  Filled 2016-11-05: qty 100

## 2016-11-05 MED ORDER — BUPIVACAINE LIPOSOME 1.3 % IJ SUSP
20.0000 mL | INTRAMUSCULAR | Status: DC
  Filled 2016-11-05: qty 20

## 2016-11-05 MED ORDER — HYDROMORPHONE HCL 1 MG/ML IJ SOLN
INTRAMUSCULAR | Status: AC
  Filled 2016-11-05: qty 0.5

## 2016-11-05 MED ORDER — LIDOCAINE 2% (20 MG/ML) 5 ML SYRINGE
INTRAMUSCULAR | Status: DC | PRN
Start: 2016-11-05 — End: 2016-11-05
  Administered 2016-11-05: 70 mg via INTRAVENOUS

## 2016-11-05 MED ORDER — MIDAZOLAM HCL 5 MG/5ML IJ SOLN
INTRAMUSCULAR | Status: DC | PRN
  Administered 2016-11-05: 2 mg via INTRAVENOUS

## 2016-11-05 MED ORDER — FENTANYL CITRATE (PF) 100 MCG/2ML IJ SOLN
INTRAMUSCULAR | Status: DC | PRN
  Administered 2016-11-05 (×3): 50 ug via INTRAVENOUS
  Administered 2016-11-05 (×4): 25 ug via INTRAVENOUS
  Administered 2016-11-05: 50 ug via INTRAVENOUS

## 2016-11-05 MED ORDER — HYDROMORPHONE HCL 1 MG/ML IJ SOLN
INTRAMUSCULAR | Status: AC
  Filled 2016-11-05: qty 1

## 2016-11-05 MED ORDER — PROPOFOL 10 MG/ML IV BOLUS
INTRAVENOUS | Status: DC | PRN
  Administered 2016-11-05: 200 mg via INTRAVENOUS

## 2016-11-05 MED ORDER — ONDANSETRON HCL 4 MG/2ML IJ SOLN
INTRAMUSCULAR | Status: AC
  Filled 2016-11-05: qty 2

## 2016-11-05 MED ORDER — HYDROMORPHONE HCL 1 MG/ML IJ SOLN
0.2500 mg | INTRAMUSCULAR | Status: DC | PRN
  Administered 2016-11-05 (×4): 0.5 mg via INTRAVENOUS

## 2016-11-05 MED ORDER — PROMETHAZINE HCL 25 MG/ML IJ SOLN: 6.2500 mg | INTRAMUSCULAR | Status: DC | PRN

## 2016-11-05 SURGICAL SUPPLY — 36 items
APL SKNCLS STERI-STRIP NONHPOA (GAUZE/BANDAGES/DRESSINGS) ×1
BENZOIN TINCTURE PRP APPL 2/3 (GAUZE/BANDAGES/DRESSINGS) ×3 IMPLANT
BLADE SAW SGTL HD 18.5X60.5X1. (BLADE) ×3 IMPLANT
BLADE SURG 21 STRL SS (BLADE) ×3 IMPLANT
BNDG COHESIVE 4X5 TAN STRL (GAUZE/BANDAGES/DRESSINGS) IMPLANT
BNDG GAUZE ELAST 4 BULKY (GAUZE/BANDAGES/DRESSINGS) IMPLANT
COVER SURGICAL LIGHT HANDLE (MISCELLANEOUS) ×3 IMPLANT
DRAPE INCISE IOBAN 66X45 STRL (DRAPES) ×5 IMPLANT
DRAPE U-SHAPE 47X51 STRL (DRAPES) ×3 IMPLANT
DRSG ADAPTIC 3X8 NADH LF (GAUZE/BANDAGES/DRESSINGS) IMPLANT
DRSG PAD ABDOMINAL 8X10 ST (GAUZE/BANDAGES/DRESSINGS) IMPLANT
DRSG VAC ATS LRG SENSATRAC (GAUZE/BANDAGES/DRESSINGS) ×2 IMPLANT
DURAPREP 26ML APPLICATOR (WOUND CARE) ×3 IMPLANT
ELECT REM PT RETURN 9FT ADLT (ELECTROSURGICAL) ×3
ELECTRODE REM PT RTRN 9FT ADLT (ELECTROSURGICAL) ×1 IMPLANT
GAUZE SPONGE 4X4 12PLY STRL (GAUZE/BANDAGES/DRESSINGS) IMPLANT
GLOVE BIOGEL PI IND STRL 9 (GLOVE) ×1 IMPLANT
GLOVE BIOGEL PI INDICATOR 9 (GLOVE) ×2
GLOVE SURG ORTHO 9.0 STRL STRW (GLOVE) ×3 IMPLANT
GOWN STRL REUS W/ TWL XL LVL3 (GOWN DISPOSABLE) ×3 IMPLANT
GOWN STRL REUS W/TWL XL LVL3 (GOWN DISPOSABLE) ×9
KIT BASIN OR (CUSTOM PROCEDURE TRAY) ×3 IMPLANT
KIT ROOM TURNOVER OR (KITS) ×3 IMPLANT
NS IRRIG 1000ML POUR BTL (IV SOLUTION) ×3 IMPLANT
PACK ORTHO EXTREMITY (CUSTOM PROCEDURE TRAY) ×3 IMPLANT
PAD ARMBOARD 7.5X6 YLW CONV (MISCELLANEOUS) ×6 IMPLANT
PREVENA INCISION MGT 90 150 (MISCELLANEOUS) ×2 IMPLANT
SPONGE LAP 18X18 X RAY DECT (DISPOSABLE) IMPLANT
SUT ETHILON 2 0 PSLX (SUTURE) ×6 IMPLANT
SUT VIC AB 2-0 CTB1 (SUTURE) IMPLANT
TOWEL OR 17X24 6PK STRL BLUE (TOWEL DISPOSABLE) ×3 IMPLANT
TOWEL OR 17X26 10 PK STRL BLUE (TOWEL DISPOSABLE) ×3 IMPLANT
TUBE CONNECTING 12'X1/4 (SUCTIONS) ×1
TUBE CONNECTING 12X1/4 (SUCTIONS) ×2 IMPLANT
WATER STERILE IRR 1000ML POUR (IV SOLUTION) ×3 IMPLANT
YANKAUER SUCT BULB TIP NO VENT (SUCTIONS) ×3 IMPLANT

## 2016-11-05 NOTE — Op Note (Signed)
11/05/2016  12:46 PM  PATIENT:  [name] Labella Sr.    PRE-OPERATIVE DIAGNOSIS:  DRY GANGRENE LEFT FOOT  POST-OPERATIVE DIAGNOSIS:  Same  PROCEDURE:  TRANSMETATARSAL AMPUTATION LEFT FOOT  SURGEON:  [name], MD  PHYSICIAN ASSISTANT:None ANESTHESIA:   General  PREOPERATIVE INDICATIONS:  [name] Vespa Sr. is a  41 y.o. male with a diagnosis of DRY GANGRENE LEFT FOOT who failed conservative measures and elected for surgical management.    The risks benefits and alternatives were discussed with the patient preoperatively including but not limited to the risks of infection, bleeding, nerve injury, cardiopulmonary complications, the need for revision surgery, among others, and the patient was willing to proceed.  OPERATIVE IMPLANTS: Prevena wound VAC  OPERATIVE FINDINGS: Patient is status post revascularization with minimal  OPERATIVE PROCEDURE: Patient was brought to the operating room and underwent a general anesthetic. After adequate levels anesthesia obtained patient's left lower extremity was prepped using DuraPrep draped into a sterile field a timeout was called. A fishmouth incision was made just proximal to the ischemic ulcers. This was carried sharply down to bone. A oscillating saw was used to transect the metatarsals mid shaft beveled plantarly gentle cascade. Electrocautery was used for hemostasis. The wound was irrigated with normal saline. The incision was closed using 2-0 nylon. A Prevena wound VAC was applied this had a good suction fit patient was extubated taken to the PACU in stable condition plan for discharge to home.

## 2016-11-05 NOTE — Anesthesia Preprocedure Evaluation (Signed)
Anesthesia Evaluation  Patient identified by MRN, date of birth, ID band Patient awake    Reviewed: Allergy & Precautions, NPO status , Patient's Chart, lab work & pertinent test results  History of Anesthesia Complications (+) PONV and history of anesthetic complications  Airway Mallampati: II  TM Distance: >3 FB Neck ROM: Full    Dental no notable dental hx.    Pulmonary neg pulmonary ROS, Current Smoker, former smoker,    Pulmonary exam normal breath sounds clear to auscultation       Cardiovascular hypertension, Pt. on medications + Peripheral Vascular Disease  Normal cardiovascular exam Rhythm:Regular Rate:Normal     Neuro/Psych negative neurological ROS  negative psych ROS   GI/Hepatic negative GI ROS, Neg liver ROS,   Endo/Other  diabetes, Insulin Dependent  Renal/GU DialysisRenal disease  negative genitourinary   Musculoskeletal negative musculoskeletal ROS (+)   Abdominal   Peds negative pediatric ROS (+)  Hematology negative hematology ROS (+)   Anesthesia Other Findings   Reproductive/Obstetrics negative OB ROS                             Anesthesia Physical Anesthesia Plan  ASA: III  Anesthesia Plan: General   Post-op Pain Management:    Induction: Intravenous  Airway Management Planned: LMA  Additional Equipment:   Intra-op Plan:   Post-operative Plan: Extubation in OR  Informed Consent: I have reviewed the patients History and Physical, chart, labs and discussed the procedure including the risks, benefits and alternatives for the proposed anesthesia with the patient or authorized representative who has indicated his/her understanding and acceptance.     Plan Discussed with:   Anesthesia Plan Comments:         Anesthesia Quick Evaluation

## 2016-11-05 NOTE — Anesthesia Postprocedure Evaluation (Addendum)
Anesthesia Post Note  Patient: [name] Poch Sr.  Procedure(s) Performed: Procedure(s) (LRB): TRANSMETATARSAL AMPUTATION LEFT FOOT (Left)  Patient location during evaluation: PACU Anesthesia Type: General Level of consciousness: awake and alert Pain management: satisfactory to patient Vital Signs Assessment: post-procedure vital signs reviewed and stable Respiratory status: spontaneous breathing, nonlabored ventilation, respiratory function stable and patient connected to nasal cannula oxygen Cardiovascular status: blood pressure returned to baseline and stable Postop Assessment: no signs of nausea or vomiting Anesthetic complications: no       Last Vitals:  Vitals:   11/05/16 1330 11/05/16 1345  BP: (!) 198/100 (!) 202/101  Pulse: (!) 103   Resp: 14 16  Temp:      Last Pain:  Vitals:   11/05/16 1300  TempSrc:   PainSc: 10-Worst pain ever                 Cathleen Yagi,JAMES TERRILL

## 2016-11-05 NOTE — Progress Notes (Signed)
consultd with dr Lucinda Dell regarding pts bp 202/101 no orders or rx obtained at this time will instru pt to take bp meds when gets home per dr massage

## 2016-11-05 NOTE — H&P (Signed)
[name] Bilyk Sr. is an 41 y.o. male.   Chief Complaint: Dry gangrene left forefoot HPI: Patient is a 41 year old gentleman with type 2 diabetes peripheral vascular disease who has gangrene of the left forefoot. Patient is undergone revascularization with Dr. Servando Snare, patient now has a palpable dorsalis pedis pulse and presents for foot salvage intervention for transmetatarsal amputation.  Past Medical History:  Diagnosis Date  . Atherosclerosis of lower extremity ([place])   . CAD (coronary artery disease)   . Chronic combined systolic and diastolic heart failure ([place])   . Complication of anesthesia   . Diabetes mellitus    Type II  . Heart murmur   . History of blood transfusion   . Hypertension   . PONV (postoperative nausea and vomiting)   . Renal disorder dialysis   MWF [name]    Past Surgical History:  Procedure Laterality Date  . ABDOMINAL AORTOGRAM N/A 11/02/2016   Procedure: Abdominal Aortogram;  Surgeon: Waynetta Sandy, MD;  Location: [place] [facility];  Service: Cardiovascular;  Laterality: N/A;  . AMPUTATION Left 09/27/2013   Procedure: LEFT GREAT TOE AMPUTATION;  Surgeon: [name], MD;  Location: [place];  Service: Orthopedics;  Laterality: Left;  . AMPUTATION Right 08/15/2015   Procedure: Right Great Toe Amputation;  Surgeon: [name], MD;  Location: [employer] [facility];  Service: Orthopedics;  Laterality: Right;  . AV FISTULA PLACEMENT  left arm  . LEFT HEART CATHETERIZATION WITH CORONARY ANGIOGRAM N/A 09/13/2014   Procedure: LEFT HEART CATHETERIZATION WITH CORONARY ANGIOGRAM;  Surgeon: Sinclair Grooms, MD;  Location: [facility];  Service: Cardiovascular;  Laterality: N/A;  . LOWER EXTREMITY ANGIOGRAPHY Bilateral 11/02/2016   Procedure: Lower Extremity Angiography;  Surgeon: Waynetta Sandy, MD;  Location: [place] [facility];  Service: Cardiovascular;  Laterality: Bilateral;  . PERIPHERAL VASCULAR ATHERECTOMY Left 11/02/2016   Procedure:  Peripheral Vascular Atherectomy;  Surgeon: Waynetta Sandy, MD;  Location: [place] [facility];  Service: Cardiovascular;  Laterality: Left;  PERONEAL    Family History  Problem Relation Age of Onset  . Heart failure Mother   . Hypertension Mother    Social History:  reports that he has been smoking Cigarettes.  He has a 10.00 pack-year smoking history. He has never used smokeless tobacco. He reports that he uses drugs, including Marijuana. He reports that he does not drink alcohol.  Allergies:  Allergies  Allergen Reactions  . Coconut Oil Anaphylaxis    Can use topically,    No prescriptions prior to admission.    No results found for this or any previous visit (from the past 48 hour(s)). No results found.  Review of Systems  All other systems reviewed and are negative.   There were no vitals taken for this visit. Physical Exam  On examination patient is alert oriented no adenopathy well-dressed normal affect course where fracture he does have an antalgic gait. Examination patient has dry gangrenous changes of the forefoot. There is no ascending cellulitis no open wounds no drainage no signs of infection he has a palpable dorsalis pedis pulse. 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  Plan: We'll plan for transmetatarsal amputation the left. Risk and benefits were discussed including risk of the wound not healing need for higher level amputation. Patient states he understands wishes to proceed at this time.  [name], MD 11/05/2016, 6:57 AM

## 2016-11-05 NOTE — Transfer of Care (Signed)
Immediate Anesthesia Transfer of Care Note  Patient: [name] Cunliffe Sr.  Procedure(s) Performed: Procedure(s): TRANSMETATARSAL AMPUTATION LEFT FOOT (Left)  Patient Location: PACU  Anesthesia Type:General  Level of Consciousness: awake, alert  and oriented  Airway & Oxygen Therapy: Patient Spontanous Breathing and Patient connected to nasal cannula oxygen  Post-op Assessment: Report given to RN, Post -op Vital signs reviewed and stable and Patient moving all extremities X 4  Post vital signs: Reviewed and stable  Last Vitals:  Vitals:   11/05/16 0852  BP: (!) 202/83  Pulse: 96  Resp: 16  Temp: 36.6 C    Last Pain:  Vitals:   11/05/16 0852  TempSrc: Oral         Complications: No apparent anesthesia complications

## 2016-11-05 NOTE — Anesthesia Procedure Notes (Signed)
Procedure Name: LMA Insertion Date/Time: 11/05/2016 12:08 PM Performed by: Merrilyn Puma B Pre-anesthesia Checklist: Patient identified, Emergency Drugs available, Suction available, Patient being monitored and Timeout performed Patient Re-evaluated:Patient Re-evaluated prior to inductionOxygen Delivery Method: Circle system utilized Preoxygenation: Pre-oxygenation with 100% oxygen Intubation Type: IV induction LMA: LMA inserted LMA Size: 5.0 Number of attempts: 2 Placement Confirmation: positive ETCO2,  CO2 detector and breath sounds checked- equal and bilateral Tube secured with: Tape Dental Injury: Teeth and Oropharynx as per pre-operative assessment

## 2016-11-06 ENCOUNTER — Encounter (HOSPITAL_COMMUNITY): Payer: Self-pay | Admitting: Orthopedic Surgery

## 2016-11-12 ENCOUNTER — Encounter (HOSPITAL_COMMUNITY): Payer: Self-pay | Admitting: *Deleted

## 2016-11-12 ENCOUNTER — Emergency Department (HOSPITAL_COMMUNITY)
Admission: EM | Admit: 2016-11-12 | Discharge: 2016-11-12 | Disposition: A | Discharge status: Home / Self Care | Payer: Medicaid Other | Attending: Emergency Medicine | Admitting: Emergency Medicine

## 2016-11-12 DIAGNOSIS — Z79899 Other long term (current) drug therapy: Secondary | ICD-10-CM | POA: Diagnosis not present

## 2016-11-12 DIAGNOSIS — F17210 Nicotine dependence, cigarettes, uncomplicated: Secondary | ICD-10-CM | POA: Diagnosis not present

## 2016-11-12 DIAGNOSIS — Z794 Long term (current) use of insulin: Secondary | ICD-10-CM | POA: Insufficient documentation

## 2016-11-12 DIAGNOSIS — I132 Hypertensive heart and chronic kidney disease with heart failure and with stage 5 chronic kidney disease, or end stage renal disease: Secondary | ICD-10-CM | POA: Insufficient documentation

## 2016-11-12 DIAGNOSIS — Z992 Dependence on renal dialysis: Secondary | ICD-10-CM | POA: Diagnosis not present

## 2016-11-12 DIAGNOSIS — I2510 Atherosclerotic heart disease of native coronary artery without angina pectoris: Secondary | ICD-10-CM | POA: Insufficient documentation

## 2016-11-12 DIAGNOSIS — E1122 Type 2 diabetes mellitus with diabetic chronic kidney disease: Secondary | ICD-10-CM | POA: Insufficient documentation

## 2016-11-12 DIAGNOSIS — N186 End stage renal disease: Secondary | ICD-10-CM | POA: Insufficient documentation

## 2016-11-12 DIAGNOSIS — I5042 Chronic combined systolic (congestive) and diastolic (congestive) heart failure: Secondary | ICD-10-CM | POA: Diagnosis not present

## 2016-11-12 DIAGNOSIS — R0789 Other chest pain: Secondary | ICD-10-CM | POA: Diagnosis not present

## 2016-11-12 DIAGNOSIS — Z4932 Encounter for adequacy testing for peritoneal dialysis: Secondary | ICD-10-CM | POA: Diagnosis present

## 2016-11-12 LAB — BASIC METABOLIC PANEL
Anion gap: 30 — ABNORMAL HIGH (ref 5–15)
BUN: 89 mg/dL — ABNORMAL HIGH (ref 6–20)
CO2: 14 mmol/L — AB (ref 22–32)
CREATININE: 23.9 mg/dL — AB (ref 0.61–1.24)
Calcium: 7.7 mg/dL — ABNORMAL LOW (ref 8.9–10.3)
Chloride: 89 mmol/L — ABNORMAL LOW (ref 101–111)
GFR calc non Af Amer: 2 mL/min — ABNORMAL LOW (ref >60)
GFR, EST AFRICAN AMERICAN: 2 mL/min — AB (ref >60)
GLUCOSE: 115 mg/dL — AB (ref 65–99)
Potassium: 4.9 mmol/L (ref 3.5–5.1)
Sodium: 133 mmol/L — ABNORMAL LOW (ref 135–145)

## 2016-11-12 LAB — CBC
HCT: 25.3 % — ABNORMAL LOW (ref 39.0–52.0)
Hemoglobin: 8.1 g/dL — ABNORMAL LOW (ref 13.0–17.0)
MCH: 24.8 pg — ABNORMAL LOW (ref 26.0–34.0)
MCHC: 32 g/dL (ref 30.0–36.0)
MCV: 77.6 fL — ABNORMAL LOW (ref 78.0–100.0)
PLATELETS: 359 10*3/uL (ref 150–400)
RBC: 3.26 MIL/uL — AB (ref 4.22–5.81)
RDW: 19.8 % — ABNORMAL HIGH (ref 11.5–15.5)
WBC: 6.1 10*3/uL (ref 4.0–10.5)

## 2016-11-12 MED ORDER — OXYCODONE-ACETAMINOPHEN 5-325 MG PO TABS
1.0000 | ORAL_TABLET | Freq: Once | ORAL | Status: AC
  Administered 2016-11-12: 1 via ORAL
  Filled 2016-11-12: qty 1

## 2016-11-12 NOTE — ED Provider Notes (Signed)
[facility] DEPT Provider Note   CSN: [id number] Arrival date & time: 11/12/16  [place]     History   Chief Complaint Chief Complaint  Patient presents with  . Post-op Problem  . Vascular Access Problem    HPI [name] Ohms Sr. is a 41 y.o. male.  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 States that the pain in his foot is getting better since the surgery. He states that he was told to follow up with orthopedics 1 week from the date of his surgery, which would be today, for removal of his wound VAC. States that he called the office today to try to get an appointment, but that they were closed for Good Friday so he came here to have the wound VAC removed. He denies any problems with the wound VAC. No fevers, chills, nausea, vomiting, abdominal pain.  Patient does endorse chest pain that has been occurring for months during his dialysis sessions. Described as a stabbing sensation to his left rib cage. It is nonpleuritic and nonexertional. No nausea, vomiting, or diaphoresis with it. Patient denies history of coronary artery disease. He has not experienced this pain since his last dialysis session. Chest pain-free here today. Pain is partially reproduced with palpation.  Past Medical History:  Diagnosis Date  . Atherosclerosis of lower extremity ([place])   . CAD (coronary artery disease)   . Chronic combined systolic and diastolic heart failure ([place])   . Complication of anesthesia   . Diabetes mellitus    Type II  . Heart murmur   . History of blood  transfusion   . Hypertension   . PONV (postoperative nausea and vomiting)   . Renal disorder dialysis   [name]    Patient Active Problem List   Diagnosis Date Noted  . Gangrene of left foot ([place]) 08/02/2016  . Gangrene of right foot ([place]) 08/02/2016  . Achilles tendon contracture, bilateral 08/02/2016  . Anemia of renal disease 08/16/2015  . Wound infection 08/15/2015  . Diabetic ulcer of right great toe ([place]) 08/15/2015  . Pain in the chest   . Elevated troponin   . Essential hypertension   . Diabetic foot ulcer with osteomyelitis ([place]) 08/07/2013  . Diabetic foot ulcer ([place]) 08/07/2013  . ESRD on dialysis ([place]) 08/07/2013  . Diabetes ([place]) 08/07/2013  . Chest pain 05/16/2012  . Murmur 05/16/2012  . Renal disorder     Past Surgical History:  Procedure Laterality Date  . ABDOMINAL AORTOGRAM N/A 11/02/2016   Procedure: Abdominal Aortogram;  Surgeon: Waynetta Sandy, MD;  Location: [place] CV LAB;  Service: Cardiovascular;  Laterality: N/A;  . AMPUTATION Left 09/27/2013   Procedure: LEFT GREAT TOE AMPUTATION;  Surgeon: [name], MD;  Location: [place];  Service: Orthopedics;  Laterality: Left;  . AMPUTATION Right 08/15/2015   Procedure: Right Great Toe Amputation;  Surgeon: [name], MD;  Location: [place];  Service: Orthopedics;  Laterality: Right;  . AMPUTATION Left 11/05/2016   Procedure: TRANSMETATARSAL AMPUTATION LEFT FOOT;  Surgeon: [name], MD;  Location: [place];  Service: Orthopedics;  Laterality: Left;  . AV FISTULA PLACEMENT  left arm  . LEFT HEART CATHETERIZATION WITH CORONARY ANGIOGRAM N/A 09/13/2014   Procedure: LEFT HEART CATHETERIZATION WITH CORONARY ANGIOGRAM;  Surgeon: Sinclair Grooms, MD;  Location: [facility] CATH LAB;  Service: Cardiovascular;  Laterality: N/A;  . LOWER EXTREMITY ANGIOGRAPHY Bilateral 11/02/2016   Procedure: Lower Extremity Angiography;  Surgeon: Waynetta Sandy, MD;  Location: [place] CV LAB;  Service:  Cardiovascular;  Laterality: Bilateral;  . PERIPHERAL VASCULAR ATHERECTOMY Left 11/02/2016   Procedure: Peripheral Vascular Atherectomy;  Surgeon: Waynetta Sandy, MD;  Location: [place] CV LAB;  Service: Cardiovascular;  Laterality: Left;  PERONEAL       Home Medications    Prior to Admission medications   Medication Sig Start Date End Date Taking? Authorizing Provider  acetaminophen (TYLENOL) 500 MG tablet Take 1,000 mg by mouth every 6 (six) hours as needed for mild pain.   Yes Historical Provider, MD  amLODipine (NORVASC) 10 MG tablet Take 1 tablet (10 mg total) by mouth at bedtime. 08/16/15  Yes [name], MD  calcium acetate (PHOSLO) 667 MG capsule Take 1 capsule (667 mg total) by mouth 3 (three) times daily with meals. 08/16/15  Yes [name], MD  Ceftazidime (FORTAZ) 2 g SOLR injection Inject 2 g into the vein every Monday, Wednesday, and Friday.   Yes Historical Provider, MD  clopidogrel (PLAVIX) 75 MG tablet Take 1 tablet (75 mg total) by mouth daily. 11/02/16  Yes Waynetta Sandy, MD  glucagon (GLUCAGON EMERGENCY) 1 MG injection Inject 1 mg into the vein once as needed (low blood sugar and inability to eat or drink sugar). 08/16/15  Yes [name], MD  glucose 4 GM chewable tablet Chew 1 tablet (4 g total) by mouth as needed for low blood sugar. 08/16/15  Yes [name], MD  insulin glargine (LANTUS) 100 UNIT/ML injection Inject 0.1 mLs (10 Units total) into the skin at bedtime. Patient taking differently: Inject 12 Units into the skin at bedtime.  09/15/14  Yes [name], DO  lanthanum (FOSRENOL) 1000 MG chewable tablet Chew 2 tablets (2,000 mg total) by mouth 3 (three) times daily with meals. Patient taking differently: Chew 2,000 mg by mouth 3 (three) times daily with meals. May take an additional 1000 - 2000 mgs with snacks 08/16/15  Yes [name], MD  oxyCODONE-acetaminophen (PERCOCET/ROXICET) 5-325 MG tablet Take 1 tablet by  mouth every 4 (four) hours as needed for severe pain.   Yes Historical Provider, MD  vancomycin 1,000 mg in sodium chloride 0.9 % 250 mL Inject 1,000 mg into the vein every Monday, Wednesday, and Friday.   Yes Historical Provider, MD    Family History Family History  Problem Relation Age of Onset  . Heart failure Mother   . Hypertension Mother     Social History Social History  Substance Use Topics  . Smoking status: Current Every Day Smoker    Packs/day: 0.50    Years: 20.00    Types: Cigarettes    Last attempt to quit: 09/27/2014  . Smokeless tobacco: Never Used  . Alcohol use No     Allergies   Coconut oil   Review of Systems Review of Systems  Constitutional: Negative for chills and fever.  HENT: Negative for congestion.   Eyes: Negative for visual disturbance.  Respiratory: Negative for cough and shortness of breath.   Cardiovascular: Positive for  chest pain (not present currently). Negative for palpitations.  Gastrointestinal: Negative for abdominal pain, diarrhea, nausea and vomiting.  Genitourinary: Negative for flank pain.  Musculoskeletal: Positive for myalgias (L foot). Negative for arthralgias, back pain, joint swelling, neck pain and neck stiffness.  Skin: Positive for wound (L foot, wound vac in place).  Neurological: Negative for dizziness, syncope, weakness, light-headedness, numbness and headaches.  Psychiatric/Behavioral: Negative for confusion.     Physical Exam Updated Vital Signs BP (!) 148/74 (BP Location: Right Arm)   Pulse 84   Temp 97.9 F (36.6 C)   Resp 16   SpO2 99%   Physical Exam  Constitutional: He is oriented to person, place, and time. He appears well-developed and well-nourished.  HENT:  Head: Normocephalic and atraumatic.  Eyes: Conjunctivae are normal.  Neck: Normal range of motion. Neck supple.  Cardiovascular: Normal rate, regular rhythm and normal heart sounds.   No murmur heard. Pulmonary/Chest: Effort normal and  breath sounds normal. No respiratory distress. He exhibits tenderness (L chest wall).  Abdominal: Soft. He exhibits no distension. There is no tenderness.  Musculoskeletal: He exhibits no edema.  Wound vac in place to the left foot, no drainage around vac site, lower extremity appears well-perfused  Neurological: He is alert and oriented to person, place, and time. He exhibits normal muscle tone.  Skin: Skin is warm and dry.  Nursing note and vitals reviewed.    ED Treatments / Results  Labs (all labs ordered are listed, but only abnormal results are displayed) Labs Reviewed  BASIC METABOLIC PANEL - Abnormal; Notable for the following:       Result Value   Sodium 133 (*)    Chloride 89 (*)    CO2 14 (*)    Glucose, Bld 115 (*)    BUN 89 (*)    Creatinine, Ser 23.90 (*)    Calcium 7.7 (*)    GFR calc non Af Amer 2 (*)    GFR calc Af Amer 2 (*)    Anion gap 30 (*)    All other components within normal limits  CBC - Abnormal; Notable for the following:    RBC 3.26 (*)    Hemoglobin 8.1 (*)    HCT 25.3 (*)    MCV 77.6 (*)    MCH 24.8 (*)    RDW 19.8 (*)    All other components within normal limits  URINALYSIS, ROUTINE W REFLEX MICROSCOPIC    EKG  EKG Interpretation None       Radiology No results found.  Procedures Procedures (including critical care time)  Medications Ordered in ED Medications  oxyCODONE-acetaminophen (PERCOCET/ROXICET) 5-325 MG per tablet 1 tablet (1 tablet Oral Given 11/12/16 2134)     Initial Impression / Assessment and Plan / ED Course  I have reviewed the triage vital signs and the nursing notes.  Pertinent labs & imaging results that were available during my care of the patient were reviewed by me and considered in my medical decision making (see chart for details).     Generally well-appearing. Afebrile and hemodynamically stable. Wound VAC to the left lower extremity is clean, dry, and intact. No swelling above the surgical site.  Patient denies fevers or chills. Patient was counseled to call his orthopedist on Monday to plan for removal of the wound VAC. There is no emergent indication for me to remove this in the emergency department today.   Labs with no leukocytosis. Hemoglobin of 8.1 which is at the patient's recent  baseline. BMP reveals potassium of 4.9, creatinine of 23.9, bicarbonate 15, BUN of 89. I spoke with the nephrologist on-call, Dr. Florene Glen, regarding the patient's case. They're aware of the patient's labs. As he is not short of breath or hyperkalemic, there is no indication for emergent dialysis at this time. Per nephrology, patient should call [facility] dialysis center tomorrow at 7 AM, and he should be able to get an appointment for dialysis tomorrow. Patient normally takes the city bus to his dialysis appointments, and is able to ambulate with crutches so this should be possible.  When this plan was discussed with the patient, he became angry, stating that he was hoping to be dialyzed here instead. I've explained to him that dialyzing him on an emergent basis here today is not necessary, but that his dialysis center should be able to fit him in tomorrow per nephrology. I've asked him to please return to the emergency department should he for whatever reason not be able to get dialyzed tomorrow. As the city bus is no longer running tonight, a taxi was arranged for transportation of the patient home.   Patient also reported some chest pain that has been occurring during dialysis for months. He is asymptomatic here today. Description of the pain as sharp, nonradiating, nonexertional, is not consistent with cardiac chest pain. The pain is reproduced with palpation of his left chest wall. I'm also not suspicious for PE as he denies any shortness of breath or pleuritic nature of the pain. EKG obtained here shows mild ST depression and T-wave inversions in the anterior lateral leads that are unchanged from prior. No  emergent workup indicated, but the pt was asked to discuss this with his nephrologist.   Patient discharged in good condition.  Care of patient overseen by my attending, Dr. Jeanell Sparrow.  Final Clinical Impressions(s) / ED Diagnoses   Final diagnoses:  ESRD (end stage renal disease) [facility])    New Prescriptions Discharge Medication List as of 11/12/2016 10:39 PM       Quanna Wittke Algernon Huxley, MD 11/13/16 0145    Pattricia Boss, MD 11/13/16 1707

## 2016-11-12 NOTE — ED Notes (Signed)
Pt updated that his room is being cleaned.

## 2016-11-12 NOTE — ED Triage Notes (Signed)
Pt has drainage tube in place in the R foot, pt states, "the tube was supposed to be moved today."

## 2016-11-12 NOTE — ED Notes (Signed)
Resident MD explained tests results and discharge plan to pt.

## 2016-11-12 NOTE — ED Notes (Signed)
Unable to give urine at this time

## 2016-11-12 NOTE — ED Triage Notes (Signed)
Pt here via [facility], per report pt had x4 toes amputated on the L foot x 6 days ago, pt reports not haing HD since 11/03/16, pt reports some mid cp, pt present with dressing on L foot no obvious drainage noted at this time, A&O x4

## 2016-11-12 NOTE — Discharge Instructions (Signed)
Please call [facility] at [phone] tomorrow at 7 AM. Please let them know that you have not had dialysis in 10 days, and that you were instructed by Dr. Florene Glen with nephrology to call first thing in the morning to get an appointment to be dialyzed tomorrow. If you're unable to receive dialysis tomorrow, return to the emergency department.

## 2016-11-12 NOTE — ED Notes (Signed)
Pt. upset with staff ( MD/nurse) regarding his discharge plan , taxi voucher obtained for pt.

## 2016-11-16 ENCOUNTER — Encounter (INDEPENDENT_AMBULATORY_CARE_PROVIDER_SITE_OTHER): Payer: Self-pay | Admitting: Orthopedic Surgery

## 2016-11-16 ENCOUNTER — Ambulatory Visit (INDEPENDENT_AMBULATORY_CARE_PROVIDER_SITE_OTHER): Payer: Medicaid Other | Admitting: Orthopedic Surgery

## 2016-11-16 DIAGNOSIS — M6702 Short Achilles tendon (acquired), left ankle: Secondary | ICD-10-CM

## 2016-11-16 DIAGNOSIS — Z89432 Acquired absence of left foot: Secondary | ICD-10-CM

## 2016-11-16 NOTE — Progress Notes (Signed)
Office Visit Note   Patient: [name] Lusby Sr.           Date of Birth: 1976/04/17           MRN: [id number] Visit Date: 11/16/2016              Requested by: Mauricia Area, MD [address] [place], [place] [address] PCP: Placido Sou, MD  Chief Complaint  Patient presents with  . Left Foot - Routine Post Op    Left Transmetatarsal Amputation 11/05/16 11 days post op      HPI: Patient is status post transmetatarsal amputation left foot 11 days out. He is nonweightbearing with crutches the wound VAC stopped working on Friday and the dressing was removed today.  Assessment & Plan: Visit Diagnoses:  1. S/P transmetatarsal amputation of foot, left ([place])   2. Achilles tendon contracture, left     Plan: Start Dial soap cleansing daily dry dressing change daily heel cord stretching and continue nonweightbearing on crutches.  Follow-Up Instructions: Return in about 2 weeks (around 11/30/2016).   Ortho Exam  Patient is alert, oriented, no adenopathy, well-dressed, normal affect, normal respiratory effort. Examination there is some mild maceration along the surgical incision there is a small amount of bloody drainage no cellulitis no odor no signs of infection. Patient does have heel cord tightness with dorsiflexion about 20 short of neutral.  Imaging: No results found.  Labs: Lab Results  Component Value Date   HGBA1C 6.1 (H) 08/15/2015   HGBA1C 5.9 (H) 09/12/2014   HGBA1C (H) 01/30/2010    8.0 (NOTE)                                                                       According to the ADA Clinical Practice Recommendations for 2011, when HbA1c is used as a screening test:   >=6.5%   Diagnostic of Diabetes Mellitus           (if abnormal result  is confirmed)  5.7-6.4%   Increased risk of developing Diabetes Mellitus  References:Diagnosis and Classification of Diabetes Mellitus,Diabetes TGGY,6948,54(OEVOJ 1):S62-S69 and Standards of Medical Care in         Diabetes  - 2011,Diabetes Care,2011,34  (Suppl 1):S11-S61.   ESRSEDRATE 94 (H) 08/15/2015   CRP 5.2 (H) 08/15/2015   REPTSTATUS 08/20/2015 FINAL 08/15/2015   GRAMSTAIN  08/08/2013    FEW WBC PRESENT, PREDOMINANTLY PMN RARE SQUAMOUS EPITHELIAL CELLS PRESENT MODERATE GRAM POSITIVE COCCI IN PAIRS IN CLUSTERS Performed at [facility]   CULT NO GROWTH 5 DAYS 08/15/2015   LABORGA METHICILLIN RESISTANT STAPHYLOCOCCUS AUREUS 08/08/2013    Orders:  No orders of the defined types were placed in this encounter.  No orders of the defined types were placed in this encounter.    Procedures: No procedures performed  Clinical Data: No additional findings.  ROS:  All other systems negative, except as noted in the HPI. Review of Systems  Objective: Vital Signs: There were no vitals taken for this visit.  Specialty Comments:  No specialty comments available.  PMFS History: Patient Active Problem List   Diagnosis Date Noted  . S/P transmetatarsal amputation of foot, left ([place]) 11/16/2016  . Gangrene of left foot ([place]) 08/02/2016  .  Gangrene of right foot ([place]) 08/02/2016  . Achilles tendon contracture, left 08/02/2016  . Anemia of renal disease 08/16/2015  . Wound infection 08/15/2015  . Diabetic ulcer of right great toe ([place]) 08/15/2015  . Pain in the chest   . Elevated troponin   . Essential hypertension   . Diabetic foot ulcer with osteomyelitis ([place]) 08/07/2013  . Diabetic foot ulcer ([place]) 08/07/2013  . ESRD on dialysis ([place]) 08/07/2013  . Diabetes ([place]) 08/07/2013  . Chest pain 05/16/2012  . Murmur 05/16/2012  . Renal disorder    Past Medical History:  Diagnosis Date  . Atherosclerosis of lower extremity ([place])   . CAD (coronary artery disease)   . Chronic combined systolic and diastolic heart failure ([place])   . Complication of anesthesia   . Diabetes mellitus    Type II  . Heart murmur   . History of blood transfusion   . Hypertension   . PONV (postoperative  nausea and vomiting)   . Renal disorder dialysis   MWF [facility]    Family History  Problem Relation Age of Onset  . Heart failure Mother   . Hypertension Mother     Past Surgical History:  Procedure Laterality Date  . ABDOMINAL AORTOGRAM N/A 11/02/2016   Procedure: Abdominal Aortogram;  Surgeon: Waynetta Sandy, MD;  Location: [place] CV LAB;  Service: Cardiovascular;  Laterality: N/A;  . AMPUTATION Left 09/27/2013   Procedure: LEFT GREAT TOE AMPUTATION;  Surgeon: [name], MD;  Location: [place];  Service: Orthopedics;  Laterality: Left;  . AMPUTATION Right 08/15/2015   Procedure: Right Great Toe Amputation;  Surgeon: [name], MD;  Location: [place];  Service: Orthopedics;  Laterality: Right;  . AMPUTATION Left 11/05/2016   Procedure: TRANSMETATARSAL AMPUTATION LEFT FOOT;  Surgeon: [name], MD;  Location: [place];  Service: Orthopedics;  Laterality: Left;  . AV FISTULA PLACEMENT  left arm  . LEFT HEART CATHETERIZATION WITH CORONARY ANGIOGRAM N/A 09/13/2014   Procedure: LEFT HEART CATHETERIZATION WITH CORONARY ANGIOGRAM;  Surgeon: Sinclair Grooms, MD;  Location[facility] CATH LAB;  Service: Cardiovascular;  Laterality: N/A;  . LOWER EXTREMITY ANGIOGRAPHY Bilateral 11/02/2016   Procedure: Lower Extremity Angiography;  Surgeon: Waynetta Sandy, MD;  Location: [place] CV LAB;  Service: Cardiovascular;  Laterality: Bilateral;  . PERIPHERAL VASCULAR ATHERECTOMY Left 11/02/2016   Procedure: Peripheral Vascular Atherectomy;  Surgeon: Waynetta Sandy, MD;  Location: [place] CV LAB;  Service: Cardiovascular;  Laterality: Left;  PERONEAL   Social History   Occupational History  . Not on file.   Social History Main Topics  . Smoking status: Current Every Day Smoker    Packs/day: 0.50    Years: 20.00    Types: Cigarettes    Last attempt to quit: 09/27/2014  . Smokeless tobacco: Never Used  . Alcohol use No  . Drug use: Yes    Types: Marijuana      Comment: last time 10/28/2016  . Sexual activity: Not on file

## 2016-12-02 ENCOUNTER — Ambulatory Visit (INDEPENDENT_AMBULATORY_CARE_PROVIDER_SITE_OTHER): Payer: Medicaid Other | Admitting: Orthopedic Surgery

## 2016-12-02 DIAGNOSIS — Z89432 Acquired absence of left foot: Secondary | ICD-10-CM

## 2016-12-02 NOTE — Progress Notes (Signed)
Office Visit Note   Patient: [name].           Date of Birth: Jan 08, 1976           MRN: [id number] Visit Date: 12/02/2016              Requested by: Mauricia Area, MD [place],  [id number] PCP: Placido Sou, MD  No chief complaint on file.     HPI: The patient is a 41 year old gentleman who presents today status post left transmetatarsal amputation on March 23. His sutures were harvested today. There is a small area opening up on his incision. He has been nonweightbearing with a postop shoe and crutches. The patient is still smoking.  Assessment & Plan: Visit Diagnoses:  1. S/P transmetatarsal amputation of foot, left ([place])     Plan: Him continue with daily Dial soap cleansing. Dry dressings and Ace wrap. The stressed the importance of nonweightbearing on her left foot. Uses crutches. He'll follow-up in office in 2 weeks.  Follow-Up Instructions: Return in about 2 weeks (around 12/16/2016).   Ortho Exam  Patient is alert, oriented, no adenopathy, well-dressed, normal affect, normal respiratory effort. Left transmetatarsal amputation is healed medially. There are 2 open areas along the center of the incision that are about 2 cm in length to use has "little are just 3 mm wide. This is about 3-4 mm deep. There is bleeding. There is no drainage no erythema no warmth no odor no sign of infection. Does have a palpable pulse.  Imaging: No results found.  Labs: Lab Results  Component Value Date   HGBA1C 6.1 (H) 08/15/2015   HGBA1C 5.9 (H) 09/12/2014   HGBA1C (H) 01/30/2010    8.0 (NOTE)                                                                       According to the ADA Clinical Practice Recommendations for 2011, when HbA1c is used as a screening test:   >=6.5%   Diagnostic of Diabetes Mellitus           (if abnormal result  is confirmed)  5.7-6.4%   Increased risk of developing Diabetes Mellitus  References:Diagnosis and  Classification of Diabetes Mellitus,Diabetes QJJH,4174,08(XKGYJ 1):S62-S69 and Standards of Medical Care in         Diabetes - 2011,Diabetes Care,2011,34  (Suppl 1):S11-S61.   ESRSEDRATE 94 (H) 08/15/2015   CRP 5.2 (H) 08/15/2015   REPTSTATUS 08/20/2015 FINAL 08/15/2015   GRAMSTAIN  08/08/2013    FEW WBC PRESENT, PREDOMINANTLY PMN RARE SQUAMOUS EPITHELIAL CELLS PRESENT MODERATE GRAM POSITIVE COCCI IN PAIRS IN CLUSTERS Performed at [facility]   CULT NO GROWTH 5 DAYS 08/15/2015   LABORGA METHICILLIN RESISTANT STAPHYLOCOCCUS AUREUS 08/08/2013    Orders:  No orders of the defined types were placed in this encounter.  No orders of the defined types were placed in this encounter.    Procedures: No procedures performed  Clinical Data: No additional findings.  ROS:  All other systems negative, except as noted in the HPI. Review of Systems  Constitutional: Negative for chills and fever.    Objective: Vital Signs: There were no vitals taken for this visit.  Specialty Comments:  No specialty comments available.  PMFS History: Patient Active Problem List   Diagnosis Date Noted  . S/P transmetatarsal amputation of foot, left ([place]) 11/16/2016  . Gangrene of right foot ([place]) 08/02/2016  . Achilles tendon contracture, left 08/02/2016  . Anemia of renal disease 08/16/2015  . Wound infection 08/15/2015  . Diabetic ulcer of right great toe ([place]) 08/15/2015  . Pain in the chest   . Elevated troponin   . Essential hypertension   . ESRD on dialysis ([place]) 08/07/2013  . Diabetes ([place]) 08/07/2013  . Chest pain 05/16/2012  . Murmur 05/16/2012  . Renal disorder    Past Medical History:  Diagnosis Date  . Atherosclerosis of lower extremity ([place])   . CAD (coronary artery disease)   . Chronic combined systolic and diastolic heart failure ([place])   . Complication of anesthesia   . Diabetes mellitus    Type II  . Heart murmur   . History of blood transfusion   .  Hypertension   . PONV (postoperative nausea and vomiting)   . Renal disorder dialysis   MWF [facility]    Family History  Problem Relation Age of Onset  . Heart failure Mother   . Hypertension Mother     Past Surgical History:  Procedure Laterality Date  . ABDOMINAL AORTOGRAM N/A 11/02/2016   Procedure: Abdominal Aortogram;  Surgeon: Waynetta Sandy, MD;  Location: [facility];  Service: Cardiovascular;  Laterality: N/A;  . AMPUTATION Left 09/27/2013   Procedure: LEFT GREAT TOE AMPUTATION;  Surgeon: [name], MD;  Location: [facility] [facility];  Service: Orthopedics;  Laterality: Left;  . AMPUTATION Right 08/15/2015   Procedure: Right Great Toe Amputation;  Surgeon: [name], MD;  Location: [facility];  Service: Orthopedics;  Laterality: Right;  . AMPUTATION Left 11/05/2016   Procedure: TRANSMETATARSAL AMPUTATION LEFT FOOT;  Surgeon: [name], MD;  Location: [facility];  Service: Orthopedics;  Laterality: Left;  . AV FISTULA PLACEMENT  left arm  . LEFT HEART CATHETERIZATION WITH CORONARY ANGIOGRAM N/A 09/13/2014   Procedure: LEFT HEART CATHETERIZATION WITH CORONARY ANGIOGRAM;  Surgeon: Sinclair Grooms, MD;  Location: [facility] C[facility];  Service: Cardiovascular;  Laterality: N/A;  . LOWER EXTREMITY ANGIOGRAPHY Bilateral 11/02/2016   Procedure: Lower Extremity Angiography;  Surgeon: Waynetta Sandy, MD;  Location: [facility];  Service: Cardiovascular;  Laterality: Bilateral;  . PERIPHERAL VASCULAR ATHERECTOMY Left 11/02/2016   Procedure: Peripheral Vascular Atherectomy;  Surgeon: Waynetta Sandy, MD;  Location: [facility];  Service: Cardiovascular;  Laterality: Left;  PERONEAL   Social History   Occupational History  . Not on file.   Social History Main Topics  . Smoking status: Current Every Day Smoker    Packs/day: 0.50    Years: 20.00    Types: Cigarettes    Last attempt to quit: 09/27/2014  . Smokeless tobacco: Never Used  . Alcohol use No  .  Drug use: Yes    Types: Marijuana     Comment: last time 10/28/2016  . Sexual activity: Not on file

## 2016-12-03 ENCOUNTER — Encounter: Payer: Self-pay | Admitting: Vascular Surgery

## 2016-12-03 ENCOUNTER — Inpatient Hospital Stay (HOSPITAL_COMMUNITY): Admission: RE | Admit: 2016-12-03 | Payer: Medicaid Other | Source: Ambulatory Visit

## 2016-12-10 ENCOUNTER — Ambulatory Visit: Payer: Medicaid Other | Admitting: Vascular Surgery

## 2016-12-16 ENCOUNTER — Encounter (INDEPENDENT_AMBULATORY_CARE_PROVIDER_SITE_OTHER): Payer: Self-pay | Admitting: Orthopedic Surgery

## 2016-12-16 ENCOUNTER — Ambulatory Visit (INDEPENDENT_AMBULATORY_CARE_PROVIDER_SITE_OTHER): Payer: Medicaid Other | Admitting: Orthopedic Surgery

## 2016-12-16 VITALS — Ht 74.0 in | Wt 190.0 lb

## 2016-12-16 DIAGNOSIS — I96 Gangrene, not elsewhere classified: Secondary | ICD-10-CM

## 2016-12-16 DIAGNOSIS — Z89432 Acquired absence of left foot: Secondary | ICD-10-CM

## 2016-12-16 MED ORDER — SILVER SULFADIAZINE 1 % EX CREA: 1.0000 "application " | TOPICAL_CREAM | Freq: Every day | CUTANEOUS | 3 refills | Status: DC

## 2016-12-16 MED ORDER — NITROGLYCERIN 0.2 MG/HR TD PT24: 0.2000 mg | MEDICATED_PATCH | Freq: Every day | TRANSDERMAL | 12 refills | Status: DC

## 2016-12-16 MED ORDER — OXYCODONE-ACETAMINOPHEN 5-325 MG PO TABS: 1.0000 | ORAL_TABLET | Freq: Three times a day (TID) | ORAL | 0 refills | Status: DC | PRN

## 2016-12-16 NOTE — Progress Notes (Signed)
Office Visit Note   Patient: [name] Demore Sr.           Date of Birth: 06-Feb-1976           MRN: [id number] Visit Date: 12/16/2016              Requested by: Mauricia Area, MD [address] [place], [place] [address] PCP: Placido Sou, MD  Chief Complaint  Patient presents with  . Left Foot - Follow-up    11/05/16 Left transmetatarsal amputation 41 days post op.       HPI: Patient is about 5 weeks status post left transmetatarsal amputation is full weightbearing. He is washing the foot with soap and water and using a dry dressing.  Assessment & Plan: Visit Diagnoses:  1. Gangrene of left foot ([place])   2. S/P transmetatarsal amputation of foot, left ([place])     Plan: We will proceed with foot salvage we will place the patient on strict nonweightbearing on the left foot recommended a kneeling scooter or walker. Dial soap cleansing daily plus a nitroglycerin patch daily plus Silvadene dressing changes daily.  Follow-Up Instructions: Return in about 2 weeks (around 12/30/2016).   Ortho Exam  Patient is alert, oriented, no adenopathy, well-dressed, normal affect, normal respiratory effort. Patient is full weightbearing with a cane. The wound has dehisced it's approximately 1 cm wide 1 cm deep and 4 cm in length. There is no ascending cellulitis there is no purulence no drainage. Patient does have a palpable dorsalis pedis pulse he has intact macro circulation but does not have sufficient microcirculation. There is no black gangrenous changes.  Imaging: No results found.  Labs: Lab Results  Component Value Date   HGBA1C 6.1 (H) 08/15/2015   HGBA1C 5.9 (H) 09/12/2014   HGBA1C (H) 01/30/2010    8.0 (NOTE)                                                                       According to the ADA Clinical Practice Recommendations for 2011, when HbA1c is used as a screening test:   >=6.5%   Diagnostic of Diabetes Mellitus           (if abnormal result  is confirmed)   5.7-6.4%   Increased risk of developing Diabetes Mellitus  References:Diagnosis and Classification of Diabetes Mellitus,Diabetes EXHB,7169,67(ELFYB 1):S62-S69 and Standards of Medical Care in         Diabetes - 2011,Diabetes Care,2011,34  (Suppl 1):S11-S61.   ESRSEDRATE 94 (H) 08/15/2015   CRP 5.2 (H) 08/15/2015   REPTSTATUS 08/20/2015 FINAL 08/15/2015   GRAMSTAIN  08/08/2013    FEW WBC PRESENT, PREDOMINANTLY PMN RARE SQUAMOUS EPITHELIAL CELLS PRESENT MODERATE GRAM POSITIVE COCCI IN PAIRS IN CLUSTERS Performed at [facility]   CULT NO GROWTH 5 DAYS 08/15/2015   LABORGA METHICILLIN RESISTANT STAPHYLOCOCCUS AUREUS 08/08/2013    Orders:  No orders of the defined types were placed in this encounter.  Meds ordered this encounter  Medications  . silver sulfADIAZINE (SILVADENE) 1 % cream    Sig: Apply 1 application topically daily. Apply to affected area daily plus dry dressing    Dispense:  400 g    Refill:  3  . nitroGLYCERIN (NITRODUR - DOSED IN  MG/24 HR) 0.2 mg/hr patch    Sig: Place 1 patch (0.2 mg total) onto the skin daily.    Dispense:  30 patch    Refill:  12     Procedures: No procedures performed  Clinical Data: No additional findings.  ROS:  All other systems negative, except as noted in the HPI. Review of Systems  Objective: Vital Signs: Ht 6\' 2"  (1.88 m)   Wt 190 lb (86.2 kg)   BMI 24.39 kg/m   Specialty Comments:  No specialty comments available.  PMFS History: Patient Active Problem List   Diagnosis Date Noted  . S/P transmetatarsal amputation of foot, left ([place]) 11/16/2016  . Gangrene of right foot ([place]) 08/02/2016  . Achilles tendon contracture, left 08/02/2016  . Anemia of renal disease 08/16/2015  . Wound infection 08/15/2015  . Diabetic ulcer of right great toe ([place]) 08/15/2015  . Pain in the chest   . Elevated troponin   . Essential hypertension   . ESRD on dialysis ([place]) 08/07/2013  . Diabetes ([place]) 08/07/2013  . Chest pain  05/16/2012  . Murmur 05/16/2012  . Renal disorder    Past Medical History:  Diagnosis Date  . Atherosclerosis of lower extremity ([place])   . CAD (coronary artery disease)   . Chronic combined systolic and diastolic heart failure ([place])   . Complication of anesthesia   . Diabetes mellitus    Type II  . Heart murmur   . History of blood transfusion   . Hypertension   . PONV (postoperative nausea and vomiting)   . Renal disorder dialysis   MWF [facility]    Family History  Problem Relation Age of Onset  . Heart failure Mother   . Hypertension Mother     Past Surgical History:  Procedure Laterality Date  . ABDOMINAL AORTOGRAM N/A 11/02/2016   Procedure: Abdominal Aortogram;  Surgeon: Waynetta Sandy, MD;  Location: [facility];  Service: Cardiovascular;  Laterality: N/A;  . AMPUTATION Left 09/27/2013   Procedure: LEFT GREAT TOE AMPUTATION;  Surgeon: [name], MD;  Location: [facility];  Service: Orthopedics;  Laterality: Left;  . AMPUTATION Right 08/15/2015   Procedure: Right Great Toe Amputation;  Surgeon: [name], MD;  Location: [facility];  Service: Orthopedics;  Laterality: Right;  . AMPUTATION Left 11/05/2016   Procedure: TRANSMETATARSAL AMPUTATION LEFT FOOT;  Surgeon: [name], MD;  Location: [facility];  Service: Orthopedics;  Laterality: Left;  . AV FISTULA PLACEMENT  left arm  . LEFT HEART CATHETERIZATION WITH CORONARY ANGIOGRAM N/A 09/13/2014   Procedure: LEFT HEART CATHETERIZATION WITH CORONARY ANGIOGRAM;  Surgeon: Sinclair Grooms, MD;  Location: [facility] CATH LAB;  Service: Cardiovascular;  Laterality: N/A;  . LOWER EXTREMITY ANGIOGRAPHY Bilateral 11/02/2016   Procedure: Lower Extremity Angiography;  Surgeon: Waynetta Sandy, MD;  Location: [facility];  Service: Cardiovascular;  Laterality: Bilateral;  . PERIPHERAL VASCULAR ATHERECTOMY Left 11/02/2016   Procedure: Peripheral Vascular Atherectomy;  Surgeon: Waynetta Sandy, MD;  Location:  [facility];  Service: Cardiovascular;  Laterality: Left;  PERONEAL   Social History   Occupational History  . Not on file.   Social History Main Topics  . Smoking status: Current Every Day Smoker    Packs/day: 0.50    Years: 20.00    Types: Cigarettes    Last attempt to quit: 09/27/2014  . Smokeless tobacco: Never Used  . Alcohol use No  . Drug use: Yes  Types: Marijuana     Comment: last time 10/28/2016  . Sexual activity: Not on file

## 2016-12-16 NOTE — Addendum Note (Signed)
Addended by: Meridee Score on: 12/16/2016 11:47 AM   Modules accepted: Orders

## 2016-12-21 ENCOUNTER — Telehealth (INDEPENDENT_AMBULATORY_CARE_PROVIDER_SITE_OTHER): Payer: Self-pay | Admitting: Radiology

## 2016-12-21 ENCOUNTER — Telehealth (INDEPENDENT_AMBULATORY_CARE_PROVIDER_SITE_OTHER): Payer: Self-pay | Admitting: Orthopedic Surgery

## 2016-12-21 NOTE — Telephone Encounter (Signed)
Prior auth meds needed, oxycodone Rx written per Sharol Given, [place]- He asks that we get this done ASAP for patient, thanks.

## 2016-12-21 NOTE — Telephone Encounter (Signed)
PT CALLED AND WANTS TO KNOW IF HE NEEDS TO DISCONTINUE NITROGLYCERIN PATCHES. HE IS ITCHING AND BURNING ON HIS FOOT.  PLEASE ADVISE.  [phone]

## 2016-12-22 NOTE — Telephone Encounter (Signed)
Approved by [place] [facility]

## 2016-12-23 NOTE — Telephone Encounter (Signed)
Called pt to advise of message below. Voiced understanding will call with questions.

## 2016-12-23 NOTE — Telephone Encounter (Signed)
Please advise 

## 2017-01-04 ENCOUNTER — Ambulatory Visit (INDEPENDENT_AMBULATORY_CARE_PROVIDER_SITE_OTHER): Payer: Medicaid Other | Admitting: Orthopedic Surgery

## 2017-01-04 ENCOUNTER — Encounter (INDEPENDENT_AMBULATORY_CARE_PROVIDER_SITE_OTHER): Payer: Self-pay | Admitting: Orthopedic Surgery

## 2017-01-04 VITALS — Ht 74.0 in | Wt 190.0 lb

## 2017-01-04 DIAGNOSIS — Z89432 Acquired absence of left foot: Secondary | ICD-10-CM

## 2017-01-04 DIAGNOSIS — T8781 Dehiscence of amputation stump: Secondary | ICD-10-CM

## 2017-01-04 NOTE — Progress Notes (Signed)
Office Visit Note   Patient: [name] Kimberlin Sr.           Date of Birth: 1975/09/25           MRN: [id number] Visit Date: 01/04/2017              Requested by: Mauricia Area, MD [address] [place], [place] [address] PCP: Mauricia Area, MD  Chief Complaint  Patient presents with  . Left Foot - Routine Post Op    11/05/16 left transmet amputation      HPI: Patient is a 41 year old gentleman with diabetic insensate neuropathy who is currently using Silvadene no evidence of using nitroglycerin patch who has progressive dehiscence of the left transmetatarsal amputation. He is approximately 2 months out from surgery. Patient has been full weightbearing he has been advised to use a scooter and be nonweightbearing but this has not occurred yet.  Assessment & Plan: Visit Diagnoses:  1. S/P transmetatarsal amputation of foot, left ([place])   2. Dehiscence of amputation stump ([facility])     Plan: Plan for revision of the left transmetatarsal amputation. Risk and benefits were discussed including potential for higher level amputation. Recommended the go to medical supply store and obtain a kneeling scooter locations were identified for him. Plan for surgery this week.  Follow-Up Instructions: Return in about 2 weeks (around 01/18/2017).   Ortho Exam  Patient is alert, oriented, no adenopathy, well-dressed, normal affect, normal respiratory effort. Examination patient has progressive dehiscence of the wound is 3 x 5 cm and 2 cm deep. There is nonviable tissue at the base. Patient does have a palpable dorsalis pedis pulse his foot is warm. His problem seems to be more microcirculatory. Patient still wants to pursue of foot salvage.  Imaging: No results found.  Labs: Lab Results  Component Value Date   HGBA1C 6.1 (H) 08/15/2015   HGBA1C 5.9 (H) 09/12/2014   HGBA1C (H) 01/30/2010    8.0 (NOTE)                                                                       According to the ADA  Clinical Practice Recommendations for 2011, when HbA1c is used as a screening test:   >=6.5%   Diagnostic of Diabetes Mellitus           (if abnormal result  is confirmed)  5.7-6.4%   Increased risk of developing Diabetes Mellitus  References:Diagnosis and Classification of Diabetes Mellitus,Diabetes EPPI,9518,84(ZYSAY 1):S62-S69 and Standards of Medical Care in         Diabetes - 2011,Diabetes Care,2011,34  (Suppl 1):S11-S61.   ESRSEDRATE 94 (H) 08/15/2015   CRP 5.2 (H) 08/15/2015   REPTSTATUS 08/20/2015 FINAL 08/15/2015   GRAMSTAIN  08/08/2013    FEW WBC PRESENT, PREDOMINANTLY PMN RARE SQUAMOUS EPITHELIAL CELLS PRESENT MODERATE GRAM POSITIVE COCCI IN PAIRS IN CLUSTERS Performed at [facility]   CULT NO GROWTH 5 DAYS 08/15/2015   LABORGA METHICILLIN RESISTANT STAPHYLOCOCCUS AUREUS 08/08/2013    Orders:  No orders of the defined types were placed in this encounter.  No orders of the defined types were placed in this encounter.    Procedures: No procedures performed  Clinical Data: No additional findings.  ROS:  All  other systems negative, except as noted in the HPI. Review of Systems  Objective: Vital Signs: Ht 6\' 2"  (1.88 m)   Wt 190 lb (86.2 kg)   BMI 24.39 kg/m   Specialty Comments:  No specialty comments available.  PMFS History: Patient Active Problem List   Diagnosis Date Noted  . Dehiscence of amputation stump ([place]) 01/04/2017  . S/P transmetatarsal amputation of foot, left ([place]) 11/16/2016  . Gangrene of right foot ([place]) 08/02/2016  . Achilles tendon contracture, left 08/02/2016  . Anemia of renal disease 08/16/2015  . Wound infection 08/15/2015  . Diabetic ulcer of right great toe ([place]) 08/15/2015  . Pain in the chest   . Elevated troponin   . Essential hypertension   . ESRD on dialysis ([place]) 08/07/2013  . Diabetes ([place]) 08/07/2013  . Chest pain 05/16/2012  . Murmur 05/16/2012  . Renal disorder    Past Medical History:  Diagnosis Date    . Atherosclerosis of lower extremity ([place])   . CAD (coronary artery disease)   . Chronic combined systolic and diastolic heart failure ([place])   . Complication of anesthesia   . Diabetes mellitus    Type II  . Heart murmur   . History of blood transfusion   . Hypertension   . PONV (postoperative nausea and vomiting)   . Renal disorder dialysis   MWF [facility]    Family History  Problem Relation Age of Onset  . Heart failure Mother   . Hypertension Mother     Past Surgical History:  Procedure Laterality Date  . ABDOMINAL AORTOGRAM N/A 11/02/2016   Procedure: Abdominal Aortogram;  Surgeon: Waynetta Sandy, MD;  Location: [facility];  Service: Cardiovascular;  Laterality: N/A;  . AMPUTATION Left 09/27/2013   Procedure: LEFT GREAT TOE AMPUTATION;  Surgeon: [name], MD;  Location: [facility];  Service: Orthopedics;  Laterality: Left;  . AMPUTATION Right 08/15/2015   Procedure: Right Great Toe Amputation;  Surgeon: [name], MD;  Location: [facility];  Service: Orthopedics;  Laterality: Right;  . AMPUTATION Left 11/05/2016   Procedure: TRANSMETATARSAL AMPUTATION LEFT FOOT;  Surgeon: [name], MD;  Location: [facility];  Service: Orthopedics;  Laterality: Left;  . AV FISTULA PLACEMENT  left arm  . LEFT HEART CATHETERIZATION WITH CORONARY ANGIOGRAM N/A 09/13/2014   Procedure: LEFT HEART CATHETERIZATION WITH CORONARY ANGIOGRAM;  Surgeon: Sinclair Grooms, MD;  Location: [facility] CATH LAB;  Service: Cardiovascular;  Laterality: N/A;  . LOWER EXTREMITY ANGIOGRAPHY Bilateral 11/02/2016   Procedure: Lower Extremity Angiography;  Surgeon: Waynetta Sandy, MD;  Location: [facility];  Service: Cardiovascular;  Laterality: Bilateral;  . PERIPHERAL VASCULAR ATHERECTOMY Left 11/02/2016   Procedure: Peripheral Vascular Atherectomy;  Surgeon: Waynetta Sandy, MD;  Location: [facility];  Service: Cardiovascular;  Laterality: Left;  PERONEAL   Social History    Occupational History  . Not on file.   Social History Main Topics  . Smoking status: Current Every Day Smoker    Packs/day: 0.50    Years: 20.00    Types: Cigarettes    Last attempt to quit: 09/27/2014  . Smokeless tobacco: Never Used  . Alcohol use No  . Drug use: Yes    Types: Marijuana     Comment: last time 10/28/2016  . Sexual activity: Not on file

## 2017-01-06 ENCOUNTER — Telehealth (INDEPENDENT_AMBULATORY_CARE_PROVIDER_SITE_OTHER): Payer: Self-pay | Admitting: Orthopedic Surgery

## 2017-01-06 NOTE — Telephone Encounter (Signed)
I called and spoke with patient this is for oxycodone. This was done by [facility] on 12/21/16. I called [facility] on [address]. Pharmacist advised that patient is in a lock in program, and unable to fill narcotics out of multiple pharmacies. I called patient he did not seem aware of this. He states that hes been using that pharmacy for a while. He is probably going to pay out of pocket 13 dollars for medication. Malachy Mood will call patients finance/girlfriend about pending surgery.Marland Kitchen

## 2017-01-06 NOTE — Telephone Encounter (Signed)
Rx authorization needed asap

## 2017-01-07 ENCOUNTER — Other Ambulatory Visit (INDEPENDENT_AMBULATORY_CARE_PROVIDER_SITE_OTHER): Payer: Self-pay | Admitting: Family

## 2017-01-07 NOTE — Progress Notes (Signed)
Anesthesia Chart Review:  Pt is a same day work up.   Pt is a 41 year old male scheduled for revision L transmetatarsal amputation on 01/11/2017 with Meridee Score, MD.   PMH includes:  CAD, chronic combined systolic and diastolic CHF, HTN, DM, heart murmur, ESRD on hemodialysis, postop N/V. Current smoker. BMI 24. S/p L metatarsal amputation 11/05/16  Medications include: Amlodipine, Plavix, Lantus, vancomycin  Labs will be obtained DOS.   EKG 11/12/16: NSR. ST & T wave abnormality, consider lateral ischemia. Prolonged QT  Echo 09/15/14:  - Left ventricle: The cavity size was normal. Wall thickness wasincreased in a pattern of moderate LVH. Systolic function wasnormal. The estimated ejection fraction was in the range of 50%to 55%. Wall motion was normal; there were no regional wallmotion abnormalities. Features are consistent with a pseudonormalleft ventricular filling pattern, with concomitant abnormalrelaxation and increased filling pressure (grade 2 diastolicdysfunction). - Aortic valve: Valve area (VTI): 3.53 cm^2. Valve area (Vmax):3.37 cm^2. Valve area (Vmean): 3.2 cm^2. - Left atrium: The atrium was moderately to severely dilated. - Atrial septum: The septum bowed from left to right, consistentwith increased left atrial pressure.  Cardiac cath 09/13/14:  1. 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 Etiology is uncertain but likely hypertension related. 2. Heavily calcified but widely patent coronary arteries. The mid to distal LAD contains an eccentric 50-60%, and the continuation of the circumflex beyond the second marginal contains 90% stenosis. The circumflex beyond the second marginal is very small in distribution and is not clinically relevant. 3. Prolonged chest pain of uncertain etiology 4. Elevated troponin related to end-stage renal disease and  cardiomyopathy.  Pt is a same day work up; he is currently in dialysis and unavailable to talk.  I do not know if he sees cardiology outside of our health system; last office visit in Epic with cardiology 10/04/14 with Jenkins Rouge, MD.  EKG 11/12/16 is unchanged from 11/02/16 but is changed when compared to EKG 06/10/15 with new T wave changes indicating possible inferolateral ischemia.    Pt tolerated same procedure 11/05/16 without issue.   Pt will need further assessment DOS by assigned anesthesiologist.   Willeen Cass, FNP-BC [facility]/Anesthesiology Phone: [phone] 01/07/2017 1:21 PM

## 2017-01-07 NOTE — Progress Notes (Addendum)
Left message on phone as patient was at dialysis.   Instructed him to be here at 11:30am  Nothing to eat or drink after midnight Monday. Only take Norvasc Tuesday morning. And only take 6 units of his evening dose of Lantus. Monday night. Pt states he will have to take the city bus to get here.  I did let him know, he will need someone to stay with him that day/night.

## 2017-01-07 NOTE — Pre-Procedure Instructions (Signed)
[name] Labella Sr.  01/07/2017      [facility] - [place], [place] - [address] [place] [address] Phone: [phone] Fax: [phone]  [place], [place] [place] [place] [address] Phone: [phone] Fax: [phone]    Your procedure is scheduled on Tuesday, may 29th   Report to [facility] Admitting at 11:30 am.  Call this number if you have problems the morning of surgery:  [phone]   Remember:  Do not eat food or drink liquids after midnight Monday  Take these medicines the morning of surgery with A SIP OF WATER :Norvasc   Do not wear jewelry, no rings or watches  Do not wear lotions, colognes or deoderant.    Men may shave face and neck.  Do not bring valuables to the hospital.  [facility] is not responsible for any belongings or valuables.  Contacts, dentures or bridgework may not be worn into surgery.  Leave your suitcase in the car.  After surgery it may be brought to your room.  For patients admitted to the hospital, discharge time will be determined by your treatment team.  Patients discharged the day of surgery will not be allowed to drive home.   Last day to take Plavix--------------------------     How to Manage Your Diabetes Before and After Surgery  Why is it important to control my blood sugar before and after surgery? . Improving blood sugar levels before and after surgery helps healing and can limit problems. . A way of improving blood sugar control is eating a healthy diet by: o  Eating less sugar and carbohydrates o  Increasing activity/exercise o  Talking with your doctor about reaching your blood sugar goals . High blood sugars (greater than 180 mg/dL) can raise your risk of infections and slow your recovery, so you will need to focus on controlling your diabetes during the weeks before surgery. . Make  sure that the doctor who takes care of your diabetes knows about your planned surgery including the date and location.  How do I manage my blood sugar before surgery? . Check your blood sugar at least 4 times a day, starting 2 days before surgery, to make sure that the level is not too high or low. o Check your blood sugar the morning of your surgery when you wake up and every 2 hours until you get to the Short Stay unit. . If your blood sugar is less than 70 mg/dL, you will need to treat for low blood sugar: o Do not take insulin. o Treat a low blood sugar (less than 70 mg/dL) with  cup of clear juice (cranberry or apple), 4 glucose tablets, OR glucose gel. o Recheck blood sugar in 15 minutes after treatment (to make sure it is greater than 70 mg/dL). If your blood sugar is not greater than 70 mg/dL on recheck, call [phone] for further instructions. . Report your blood sugar to the short stay nurse when you get to Short Stay.  . If you are admitted to the hospital after surgery: o Your blood sugar will be checked by the staff and you will probably be given insulin after surgery (instead of oral diabetes medicines) to make sure you have good blood sugar levels. o The goal for blood sugar control after surgery is 80-180 mg/dL.  WHAT DO I DO ABOUT MY DIABETES MEDICATION?   Marland Kitchen  Do not take oral diabetes medicines (pills) the morning of surgery.  . THE NIGHT BEFORE SURGERY, take ____6_______ units of _Lantus__________insulin.       Marland Kitchen HE MORNING OF SURGERY, take ________0_____ units of ______0____insulin.  . The day of surgery, do not take other diabetes injectables, including Byetta (exenatide), Bydureon (exenatide ER), Victoza (liraglutide), or Trulicity (dulaglutide).  . If your CBG is greater than 220 mg/dL, you may take  of your sliding scale (correction) dose of insulin.  Other Instructions:          Patient Signature:  Date:   Nurse Signature:  Date:   Reviewed and  Endorsed by [facility] Patient Education Committee, August 2015

## 2017-01-11 ENCOUNTER — Ambulatory Visit (HOSPITAL_COMMUNITY)
Admission: RE | Admit: 2017-01-11 | Discharge: 2017-01-11 | Disposition: A | Discharge status: Home / Self Care | Payer: Medicaid Other | Source: Ambulatory Visit | Attending: Orthopedic Surgery | Admitting: Orthopedic Surgery

## 2017-01-11 ENCOUNTER — Ambulatory Visit (HOSPITAL_COMMUNITY): Payer: Medicaid Other | Admitting: Emergency Medicine

## 2017-01-11 ENCOUNTER — Encounter (HOSPITAL_COMMUNITY)
Admission: RE | Disposition: A | Discharge status: Home / Self Care | Payer: Self-pay | Source: Ambulatory Visit | Attending: Orthopedic Surgery

## 2017-01-11 ENCOUNTER — Encounter (HOSPITAL_COMMUNITY): Payer: Self-pay | Admitting: *Deleted

## 2017-01-11 DIAGNOSIS — Z794 Long term (current) use of insulin: Secondary | ICD-10-CM | POA: Diagnosis not present

## 2017-01-11 DIAGNOSIS — T8781 Dehiscence of amputation stump: Secondary | ICD-10-CM | POA: Diagnosis not present

## 2017-01-11 DIAGNOSIS — F17210 Nicotine dependence, cigarettes, uncomplicated: Secondary | ICD-10-CM | POA: Diagnosis not present

## 2017-01-11 DIAGNOSIS — E1151 Type 2 diabetes mellitus with diabetic peripheral angiopathy without gangrene: Secondary | ICD-10-CM | POA: Insufficient documentation

## 2017-01-11 DIAGNOSIS — Z7902 Long term (current) use of antithrombotics/antiplatelets: Secondary | ICD-10-CM | POA: Diagnosis not present

## 2017-01-11 DIAGNOSIS — Z89411 Acquired absence of right great toe: Secondary | ICD-10-CM | POA: Insufficient documentation

## 2017-01-11 DIAGNOSIS — Z992 Dependence on renal dialysis: Secondary | ICD-10-CM | POA: Insufficient documentation

## 2017-01-11 DIAGNOSIS — Z8249 Family history of ischemic heart disease and other diseases of the circulatory system: Secondary | ICD-10-CM | POA: Insufficient documentation

## 2017-01-11 DIAGNOSIS — I2510 Atherosclerotic heart disease of native coronary artery without angina pectoris: Secondary | ICD-10-CM | POA: Diagnosis not present

## 2017-01-11 DIAGNOSIS — E1122 Type 2 diabetes mellitus with diabetic chronic kidney disease: Secondary | ICD-10-CM | POA: Insufficient documentation

## 2017-01-11 DIAGNOSIS — I5042 Chronic combined systolic (congestive) and diastolic (congestive) heart failure: Secondary | ICD-10-CM | POA: Insufficient documentation

## 2017-01-11 DIAGNOSIS — I132 Hypertensive heart and chronic kidney disease with heart failure and with stage 5 chronic kidney disease, or end stage renal disease: Secondary | ICD-10-CM | POA: Insufficient documentation

## 2017-01-11 DIAGNOSIS — Z89412 Acquired absence of left great toe: Secondary | ICD-10-CM | POA: Diagnosis not present

## 2017-01-11 DIAGNOSIS — F1290 Cannabis use, unspecified, uncomplicated: Secondary | ICD-10-CM | POA: Diagnosis not present

## 2017-01-11 DIAGNOSIS — R011 Cardiac murmur, unspecified: Secondary | ICD-10-CM | POA: Insufficient documentation

## 2017-01-11 DIAGNOSIS — Z91018 Allergy to other foods: Secondary | ICD-10-CM | POA: Diagnosis not present

## 2017-01-11 DIAGNOSIS — N186 End stage renal disease: Secondary | ICD-10-CM | POA: Diagnosis not present

## 2017-01-11 DIAGNOSIS — Z79899 Other long term (current) drug therapy: Secondary | ICD-10-CM | POA: Diagnosis not present

## 2017-01-11 DIAGNOSIS — Y835 Amputation of limb(s) as the cause of abnormal reaction of the patient, or of later complication, without mention of misadventure at the time of the procedure: Secondary | ICD-10-CM | POA: Insufficient documentation

## 2017-01-11 HISTORY — PX: STUMP REVISION: SHX6102

## 2017-01-11 LAB — CBC
HCT: 30.5 % — ABNORMAL LOW (ref 39.0–52.0)
Hemoglobin: 9.6 g/dL — ABNORMAL LOW (ref 13.0–17.0)
MCH: 24.7 pg — AB (ref 26.0–34.0)
MCHC: 31.5 g/dL (ref 30.0–36.0)
MCV: 78.6 fL (ref 78.0–100.0)
Platelets: 289 10*3/uL (ref 150–400)
RBC: 3.88 MIL/uL — AB (ref 4.22–5.81)
RDW: 21.4 % — ABNORMAL HIGH (ref 11.5–15.5)
WBC: 6.9 10*3/uL (ref 4.0–10.5)

## 2017-01-11 LAB — BASIC METABOLIC PANEL
Anion gap: 17 — ABNORMAL HIGH (ref 5–15)
BUN: 57 mg/dL — AB (ref 6–20)
CO2: 23 mmol/L (ref 22–32)
CREATININE: 13.31 mg/dL — AB (ref 0.61–1.24)
Calcium: 9 mg/dL (ref 8.9–10.3)
Chloride: 94 mmol/L — ABNORMAL LOW (ref 101–111)
GFR calc Af Amer: 5 mL/min — ABNORMAL LOW (ref >60)
GFR, EST NON AFRICAN AMERICAN: 4 mL/min — AB (ref >60)
GLUCOSE: 132 mg/dL — AB (ref 65–99)
POTASSIUM: 3.5 mmol/L (ref 3.5–5.1)
Sodium: 134 mmol/L — ABNORMAL LOW (ref 135–145)

## 2017-01-11 LAB — GLUCOSE, CAPILLARY
Glucose-Capillary: 140 mg/dL — ABNORMAL HIGH (ref 65–99)
Glucose-Capillary: 128 mg/dL — ABNORMAL HIGH (ref 65–99)

## 2017-01-11 SURGERY — REVISION, AMPUTATION SITE
Anesthesia: General | Laterality: Left

## 2017-01-11 MED ORDER — CHLORHEXIDINE GLUCONATE 4 % EX LIQD: 60.0000 mL | Freq: Once | CUTANEOUS | Status: DC

## 2017-01-11 MED ORDER — FENTANYL CITRATE (PF) 100 MCG/2ML IJ SOLN
50.0000 ug | Freq: Once | INTRAMUSCULAR | Status: AC
  Administered 2017-01-11: 50 ug via INTRAVENOUS

## 2017-01-11 MED ORDER — HYDROMORPHONE HCL 1 MG/ML IJ SOLN
INTRAMUSCULAR | Status: AC
  Administered 2017-01-11: 0.5 mg via INTRAVENOUS
  Filled 2017-01-11: qty 1

## 2017-01-11 MED ORDER — MIDAZOLAM HCL 2 MG/2ML IJ SOLN
INTRAMUSCULAR | Status: AC
  Filled 2017-01-11: qty 2

## 2017-01-11 MED ORDER — EPHEDRINE 5 MG/ML INJ
INTRAVENOUS | Status: AC
  Filled 2017-01-11: qty 10

## 2017-01-11 MED ORDER — FENTANYL CITRATE (PF) 250 MCG/5ML IJ SOLN
INTRAMUSCULAR | Status: AC
  Filled 2017-01-11: qty 5

## 2017-01-11 MED ORDER — OXYCODONE-ACETAMINOPHEN 5-325 MG PO TABS
ORAL_TABLET | ORAL | Status: AC
  Administered 2017-01-11: 1 via ORAL
  Filled 2017-01-11: qty 1

## 2017-01-11 MED ORDER — PROMETHAZINE HCL 25 MG/ML IJ SOLN: 6.2500 mg | INTRAMUSCULAR | Status: DC | PRN

## 2017-01-11 MED ORDER — FENTANYL CITRATE (PF) 100 MCG/2ML IJ SOLN
INTRAMUSCULAR | Status: DC | PRN
  Administered 2017-01-11 (×9): 50 ug via INTRAVENOUS

## 2017-01-11 MED ORDER — FENTANYL CITRATE (PF) 100 MCG/2ML IJ SOLN
INTRAMUSCULAR | Status: AC
  Administered 2017-01-11: 50 ug via INTRAVENOUS
  Filled 2017-01-11: qty 2

## 2017-01-11 MED ORDER — 0.9 % SODIUM CHLORIDE (POUR BTL) OPTIME
TOPICAL | Status: DC | PRN
  Administered 2017-01-11: 1000 mL

## 2017-01-11 MED ORDER — CEFAZOLIN SODIUM-DEXTROSE 2-4 GM/100ML-% IV SOLN: 2.0000 g | INTRAVENOUS | Status: DC

## 2017-01-11 MED ORDER — MIDAZOLAM HCL 5 MG/5ML IJ SOLN
INTRAMUSCULAR | Status: DC | PRN
  Administered 2017-01-11: 2 mg via INTRAVENOUS

## 2017-01-11 MED ORDER — PROPOFOL 10 MG/ML IV BOLUS
INTRAVENOUS | Status: DC | PRN
  Administered 2017-01-11: 200 mg via INTRAVENOUS

## 2017-01-11 MED ORDER — CEFAZOLIN SODIUM-DEXTROSE 1-4 GM/50ML-% IV SOLN
INTRAVENOUS | Status: AC
  Filled 2017-01-11: qty 50

## 2017-01-11 MED ORDER — OXYCODONE-ACETAMINOPHEN 5-325 MG PO TABS: 1.0000 | ORAL_TABLET | ORAL | 0 refills | Status: DC | PRN

## 2017-01-11 MED ORDER — DEXMEDETOMIDINE HCL 200 MCG/2ML IV SOLN
INTRAVENOUS | Status: DC | PRN
  Administered 2017-01-11 (×3): 4 ug via INTRAVENOUS
  Administered 2017-01-11: 12 ug via INTRAVENOUS

## 2017-01-11 MED ORDER — CEFAZOLIN SODIUM-DEXTROSE 2-4 GM/100ML-% IV SOLN
2.0000 g | INTRAVENOUS | Status: AC
  Administered 2017-01-11: 2 g via INTRAVENOUS

## 2017-01-11 MED ORDER — HYDROMORPHONE HCL 1 MG/ML IJ SOLN
INTRAMUSCULAR | Status: AC
  Administered 2017-01-11: 0.5 mg via INTRAVENOUS
  Filled 2017-01-11: qty 0.5

## 2017-01-11 MED ORDER — POVIDONE-IODINE 10 % EX SWAB: 2.0000 "application " | Freq: Once | CUTANEOUS | Status: DC

## 2017-01-11 MED ORDER — OXYCODONE-ACETAMINOPHEN 5-325 MG PO TABS
1.0000 | ORAL_TABLET | Freq: Once | ORAL | Status: AC
  Administered 2017-01-11: 1 via ORAL

## 2017-01-11 MED ORDER — PROPOFOL 10 MG/ML IV BOLUS
INTRAVENOUS | Status: AC
  Filled 2017-01-11: qty 20

## 2017-01-11 MED ORDER — HYDROMORPHONE HCL 1 MG/ML IJ SOLN
0.2500 mg | INTRAMUSCULAR | Status: DC | PRN
  Administered 2017-01-11 (×3): 0.5 mg via INTRAVENOUS

## 2017-01-11 MED ORDER — CEFAZOLIN SODIUM-DEXTROSE 2-4 GM/100ML-% IV SOLN
INTRAVENOUS | Status: AC
  Filled 2017-01-11: qty 100

## 2017-01-11 MED ORDER — SODIUM CHLORIDE 0.9 % IV SOLN
INTRAVENOUS | Status: DC
  Administered 2017-01-11: 35 mL/h via INTRAVENOUS

## 2017-01-11 MED ORDER — ONDANSETRON HCL 4 MG/2ML IJ SOLN
INTRAMUSCULAR | Status: DC | PRN
  Administered 2017-01-11: 4 mg via INTRAVENOUS

## 2017-01-11 MED ORDER — LIDOCAINE HCL (CARDIAC) 20 MG/ML IV SOLN
INTRAVENOUS | Status: DC | PRN
  Administered 2017-01-11: 60 mg via INTRAVENOUS

## 2017-01-11 MED ORDER — LIDOCAINE 2% (20 MG/ML) 5 ML SYRINGE
INTRAMUSCULAR | Status: AC
  Filled 2017-01-11: qty 5

## 2017-01-11 SURGICAL SUPPLY — 32 items
BLADE SAW RECIP 87.9 MT (BLADE) IMPLANT
BLADE SURG 21 STRL SS (BLADE) ×3 IMPLANT
COVER SURGICAL LIGHT HANDLE (MISCELLANEOUS) ×3 IMPLANT
DRAPE EXTREMITY T 121X128X90 (DRAPE) ×3 IMPLANT
DRAPE HALF SHEET 40X57 (DRAPES) ×3 IMPLANT
DRAPE INCISE IOBAN 66X45 STRL (DRAPES) ×5 IMPLANT
DRAPE U-SHAPE 47X51 STRL (DRAPES) ×6 IMPLANT
DRSG VAC ATS MED SENSATRAC (GAUZE/BANDAGES/DRESSINGS) ×3 IMPLANT
DURAPREP 26ML APPLICATOR (WOUND CARE) ×3 IMPLANT
ELECT REM PT RETURN 9FT ADLT (ELECTROSURGICAL) ×3
ELECTRODE REM PT RTRN 9FT ADLT (ELECTROSURGICAL) ×1 IMPLANT
GLOVE BIOGEL PI IND STRL 9 (GLOVE) ×1 IMPLANT
GLOVE BIOGEL PI INDICATOR 9 (GLOVE) ×2
GLOVE SURG ORTHO 9.0 STRL STRW (GLOVE) ×3 IMPLANT
GOWN STRL REUS W/ TWL XL LVL3 (GOWN DISPOSABLE) ×2 IMPLANT
GOWN STRL REUS W/TWL XL LVL3 (GOWN DISPOSABLE) ×6
KIT BASIN OR (CUSTOM PROCEDURE TRAY) ×3 IMPLANT
KIT PREVENA INCISION MGT20CM45 (CANNISTER) ×2 IMPLANT
KIT ROOM TURNOVER OR (KITS) ×3 IMPLANT
MANIFOLD NEPTUNE II (INSTRUMENTS) ×3 IMPLANT
NS IRRIG 1000ML POUR BTL (IV SOLUTION) ×3 IMPLANT
PACK GENERAL/GYN (CUSTOM PROCEDURE TRAY) ×3 IMPLANT
PAD ARMBOARD 7.5X6 YLW CONV (MISCELLANEOUS) ×3 IMPLANT
PAD NEG PRESSURE SENSATRAC (MISCELLANEOUS) IMPLANT
STAPLER VISISTAT 35W (STAPLE) IMPLANT
STOCKINETTE 6  STRL (DRAPES) ×2
STOCKINETTE 6 STRL (DRAPES) IMPLANT
SUT ETHILON 2 0 PSLX (SUTURE) ×6 IMPLANT
SUT SILK 2 0 (SUTURE)
SUT SILK 2-0 18XBRD TIE 12 (SUTURE) IMPLANT
TOWEL OR 17X26 10 PK STRL BLUE (TOWEL DISPOSABLE) ×3 IMPLANT
WND VAC CANISTER 500ML (MISCELLANEOUS) ×2 IMPLANT

## 2017-01-11 NOTE — Anesthesia Postprocedure Evaluation (Signed)
Anesthesia Post Note  Patient: Rayjon Wery Sr.  Procedure(s) Performed: Procedure(s) (LRB): Revision Left Transmetatarsal Amputation (Left)  Patient location during evaluation: PACU Anesthesia Type: General Level of consciousness: awake and alert Pain management: pain level controlled Vital Signs Assessment: post-procedure vital signs reviewed and stable Respiratory status: spontaneous breathing, nonlabored ventilation, respiratory function stable and patient connected to nasal cannula oxygen Cardiovascular status: blood pressure returned to baseline and stable Postop Assessment: no signs of nausea or vomiting Anesthetic complications: no       Last Vitals:  Vitals:   01/11/17 1512 01/11/17 1527  BP: (!) 170/106 (!) 178/94  Pulse: 100 (!) 115  Resp: 14   Temp:      Last Pain:  Vitals:   01/11/17 1610  TempSrc:   PainSc: 4                  Tiajuana Amass

## 2017-01-11 NOTE — Progress Notes (Signed)
Orthopedic Tech Progress Note Patient Details:  [name] Sena Sr. [birth year] [id number]  Ortho Devices Type of Ortho Device: Postop shoe/boot Ortho Device/Splint Location: LLE Ortho Device/Splint Interventions: Ordered, Application   Braulio Bosch 01/11/2017, 3:50 PM

## 2017-01-11 NOTE — Anesthesia Preprocedure Evaluation (Signed)
Anesthesia Evaluation  Patient identified by MRN, date of birth, ID band Patient awake    Reviewed: Allergy & Precautions, NPO status , Patient's Chart, lab work & pertinent test results  History of Anesthesia Complications (+) PONV and history of anesthetic complications  Airway Mallampati: II  TM Distance: >3 FB Neck ROM: Full    Dental no notable dental hx.    Pulmonary neg pulmonary ROS, Current Smoker, former smoker,    Pulmonary exam normal breath sounds clear to auscultation       Cardiovascular hypertension, Pt. on medications + Peripheral Vascular Disease  Normal cardiovascular exam Rhythm:Regular Rate:Normal     Neuro/Psych negative neurological ROS  negative psych ROS   GI/Hepatic negative GI ROS, Neg liver ROS,   Endo/Other  diabetes, Insulin Dependent  Renal/GU DialysisRenal disease  negative genitourinary   Musculoskeletal negative musculoskeletal ROS (+)   Abdominal   Peds negative pediatric ROS (+)  Hematology negative hematology ROS (+)   Anesthesia Other Findings   Reproductive/Obstetrics negative OB ROS                             Lab Results  Component Value Date   WBC 6.9 01/11/2017   HGB 9.6 (L) 01/11/2017   HCT 30.5 (L) 01/11/2017   MCV 78.6 01/11/2017   PLT 289 01/11/2017   Lab Results  Component Value Date   CREATININE 23.90 (H) 11/12/2016   BUN 89 (H) 11/12/2016   NA 133 (L) 11/12/2016   K 4.9 11/12/2016   CL 89 (L) 11/12/2016   CO2 14 (L) 11/12/2016    Anesthesia Physical  Anesthesia Plan  ASA: III  Anesthesia Plan: General   Post-op Pain Management:    Induction: Intravenous  Airway Management Planned: LMA  Additional Equipment:   Intra-op Plan:   Post-operative Plan: Extubation in OR  Informed Consent: I have reviewed the patients History and Physical, chart, labs and discussed the procedure including the risks, benefits and  alternatives for the proposed anesthesia with the patient or authorized representative who has indicated his/her understanding and acceptance.     Plan Discussed with:   Anesthesia Plan Comments:         Anesthesia Quick Evaluation

## 2017-01-11 NOTE — Progress Notes (Signed)
Patient currently by himself.  He understands that his cane, belongings bag with cell phone will go to PACU. He has a brother that will pick him up after surgery, and his girlfriend will be able to stay with him

## 2017-01-11 NOTE — Op Note (Signed)
01/11/2017  2:46 PM  PATIENT:  [name] Labella Sr.    PRE-OPERATIVE DIAGNOSIS:  Dehiscence Left Transmetatarsal Amputation  POST-OPERATIVE DIAGNOSIS:  Same  PROCEDURE:  Revision Left Transmetatarsal Amputation Application of Prevena wound VAC  SURGEON:  [name], MD  PHYSICIAN ASSISTANT:None ANESTHESIA:   General  PREOPERATIVE INDICATIONS:  [name] Lewelling Sr. is a  41 y.o. male with a diagnosis of Dehiscence Left Transmetatarsal Amputation who failed conservative measures and elected for surgical management.    The risks benefits and alternatives were discussed with the patient preoperatively including but not limited to the risks of infection, bleeding, nerve injury, cardiopulmonary complications, the need for revision surgery, among others, and the patient was willing to proceed.  OPERATIVE IMPLANTS: Prevena wound VAC  OPERATIVE FINDINGS: Good petechial bleeding no abscess  OPERATIVE PROCEDURE: Patient brought the operating room and underwent a general anesthetic. After adequate levels anesthesia were obtained patient's left lower extremity was prepped using DuraPrep draped into a sterile field a timeout was called. A fishmouth incision was made just proximal to the dehisced wound. This was carried down to bone. A reciprocating saw was used to resect the metatarsals in a transmetatarsal amputation. Electrocautery was used for hemostasis. There is no abscess no necrotic tissue no signs of infection. Electrocautery was used for hemostasis wound was irrigated with normal saline. Incision was closed using 2-0 nylon. A Prevena wound VAC was applied this had a good suction fit patient was extubated taken the PACU in stable condition.

## 2017-01-11 NOTE — H&P (Signed)
[name] Niebuhr Sr. is an 41 y.o. male.   Chief Complaint: Dehiscence transmetatarsal amputation left foot HPI: Patient is a 42 year old gentleman with end-stage renal disease on dialysis with type 2 diabetes who presents for revision of the amputation.  Past Medical History:  Diagnosis Date  . Atherosclerosis of lower extremity ([place])   . CAD (coronary artery disease)   . Chronic combined systolic and diastolic heart failure ([place])   . Complication of anesthesia   . Diabetes mellitus    Type II  . Heart murmur   . History of blood transfusion   . Hypertension   . PONV (postoperative nausea and vomiting)   . Renal disorder dialysis   [name]    Past Surgical History:  Procedure Laterality Date  . ABDOMINAL AORTOGRAM N/A 11/02/2016   Procedure: Abdominal Aortogram;  Surgeon: Waynetta Sandy, MD;  Location: [facility];  Service: Cardiovascular;  Laterality: N/A;  . AMPUTATION Left 09/27/2013   Procedure: LEFT GREAT TOE AMPUTATION;  Surgeon: [name], MD;  Location: [facility];  Service: Orthopedics;  Laterality: Left;  . AMPUTATION Right 08/15/2015   Procedure: Right Great Toe Amputation;  Surgeon: [name], MD;  Location: [facility];  Service: Orthopedics;  Laterality: Right;  . AMPUTATION Left 11/05/2016   Procedure: TRANSMETATARSAL AMPUTATION LEFT FOOT;  Surgeon: [name], MD;  Location: [facility];  Service: Orthopedics;  Laterality: Left;  . AV FISTULA PLACEMENT  left arm  . LEFT HEART CATHETERIZATION WITH CORONARY ANGIOGRAM N/A 09/13/2014   Procedure: LEFT HEART CATHETERIZATION WITH CORONARY ANGIOGRAM;  Surgeon: Sinclair Grooms, MD;  Location: [facility];  Service: Cardiovascular;  Laterality: N/A;  . LOWER EXTREMITY ANGIOGRAPHY Bilateral 11/02/2016   Procedure: Lower Extremity Angiography;  Surgeon: Waynetta Sandy, MD;  Location: [facility];  Service: Cardiovascular;  Laterality: Bilateral;  . PERIPHERAL VASCULAR ATHERECTOMY Left  11/02/2016   Procedure: Peripheral Vascular Atherectomy;  Surgeon: Waynetta Sandy, MD;  Location: [facility];  Service: Cardiovascular;  Laterality: Left;  PERONEAL    Family History  Problem Relation Age of Onset  . Heart failure Mother   . Hypertension Mother    Social History:  reports that he has been smoking Cigarettes.  He has a 10.00 pack-year smoking history. He has never used smokeless tobacco. He reports that he uses drugs, including Marijuana. He reports that he does not drink alcohol.  Allergies:  Allergies  Allergen Reactions  . Coconut Oil Anaphylaxis    Can use topically, allergic to coconut foods     Medications Prior to Admission  Medication Sig Dispense Refill  . acetaminophen (TYLENOL) 500 MG tablet Take 1,000 mg by mouth every 6 (six) hours as needed for mild pain.    Marland Kitchen amLODipine (NORVASC) 10 MG tablet Take 1 tablet (10 mg total) by mouth at bedtime. 30 tablet 0  . calcium acetate (PHOSLO) 667 MG capsule Take 1 capsule (667 mg total) by mouth 3 (three) times daily with meals. (Patient taking differently: Take 1,334 mg by mouth 3 (three) times daily with meals. ) 90 capsule 0  . clopidogrel (PLAVIX) 75 MG tablet Take 1 tablet (75 mg total) by mouth daily. 30 tablet 3  . glucagon (GLUCAGON EMERGENCY) 1 MG injection Inject 1 mg into the vein once as needed (low blood sugar and inability to eat or drink sugar). 1 each 0  . insulin glargine (LANTUS) 100 UNIT/ML injection Inject 0.1 mLs (10 Units  total) into the skin at bedtime. (Patient taking differently: Inject 12 Units into the skin at bedtime. ) 10 mL 12  . lanthanum (FOSRENOL) 1000 MG chewable tablet Chew 2 tablets (2,000 mg total) by mouth 3 (three) times daily with meals. (Patient taking differently: Chew 2,000 mg by mouth 3 (three) times daily with meals. May take an additional 1000 - 2000 mgs with snacks) 180 tablet 0  . silver sulfADIAZINE (SILVADENE) 1 % cream Apply 1 application topically daily.  Apply to affected area daily plus dry dressing 400 g 3  . glucose 4 GM chewable tablet Chew 1 tablet (4 g total) by mouth as needed for low blood sugar. (Patient not taking: Reported on 01/05/2017) 50 tablet 0  . nitroGLYCERIN (NITRODUR - DOSED IN MG/24 HR) 0.2 mg/hr patch Place 1 patch (0.2 mg total) onto the skin daily. (Patient not taking: Reported on 01/05/2017) 30 patch 12  . oxyCODONE-acetaminophen (PERCOCET/ROXICET) 5-325 MG tablet Take 1 tablet by mouth every 8 (eight) hours as needed for severe pain. (Patient not taking: Reported on 01/05/2017) 30 tablet 0    Results for orders placed or performed during the hospital encounter of 01/11/17 (from the past 48 hour(s))  Glucose, capillary     Status: Abnormal   Collection Time: 01/11/17 11:36 AM  Result Value Ref Range   Glucose-Capillary 140 (H) 65 - 99 mg/dL  CBC     Status: Abnormal   Collection Time: 01/11/17 12:16 PM  Result Value Ref Range   WBC 6.9 4.0 - 10.5 K/uL   RBC 3.88 (L) 4.22 - 5.81 MIL/uL   Hemoglobin 9.6 (L) 13.0 - 17.0 g/dL   HCT 30.5 (L) 39.0 - 52.0 %   MCV 78.6 78.0 - 100.0 fL   MCH 24.7 (L) 26.0 - 34.0 pg   MCHC 31.5 30.0 - 36.0 g/dL   RDW 21.4 (H) 11.5 - 15.5 %   Platelets 289 150 - 400 K/uL  Basic metabolic panel     Status: Abnormal   Collection Time: 01/11/17 12:16 PM  Result Value Ref Range   Sodium 134 (L) 135 - 145 mmol/L   Potassium 3.5 3.5 - 5.1 mmol/L   Chloride 94 (L) 101 - 111 mmol/L   CO2 23 22 - 32 mmol/L   Glucose, Bld 132 (H) 65 - 99 mg/dL   BUN 57 (H) 6 - 20 mg/dL   Creatinine, Ser 13.31 (H) 0.61 - 1.24 mg/dL   Calcium 9.0 8.9 - 10.3 mg/dL   GFR calc non Af Amer 4 (L) >60 mL/min   GFR calc Af Amer 5 (L) >60 mL/min    Comment: (NOTE) The eGFR has been calculated using the CKD EPI equation. This calculation has not been validated in all clinical situations. eGFR's persistently <60 mL/min signify possible Chronic Kidney Disease.    Anion gap 17 (H) 5 - 15   No results found.  Review  of Systems  All other systems reviewed and are negative.   Blood pressure (!) 186/84, pulse 85, temperature 98.6 F (37 C), temperature source Oral, resp. rate 18, height '6\' 2"'  (1.88 m), weight 190 lb (86.2 kg), SpO2 100 %. Physical Exam  Examination patient is alert oriented no adenopathy well-dressed normal affect effort. Patient has dehiscence of the transmetatarsal amputation left there is exposed bone. He does have a good pulse. Assessment/Plan Assessment: Dehiscence transmetatarsal amputation left foot.  Plan: We'll plan for revision of the transmetatarsal amputation. Risk and benefits were discussed including risk of a higher  level amputation. Patient states he understands wishes to proceed at this time. Of note patient has ischemic changes to the second toe on the right foot and this will need to be addressed once his left foot is healed.  [name], MD 01/11/2017, 12:59 PM

## 2017-01-11 NOTE — Anesthesia Procedure Notes (Signed)
Procedure Name: LMA Insertion Date/Time: 01/11/2017 2:18 PM Performed by: Lance Coon Pre-anesthesia Checklist: Patient identified, Emergency Drugs available, Suction available, Timeout performed and Patient being monitored Patient Re-evaluated:Patient Re-evaluated prior to inductionOxygen Delivery Method: Circle system utilized Preoxygenation: Pre-oxygenation with 100% oxygen Intubation Type: IV induction LMA: LMA inserted LMA Size: 5.0 Number of attempts: 1 Placement Confirmation: breath sounds checked- equal and bilateral and positive ETCO2 Tube secured with: Tape Dental Injury: Teeth and Oropharynx as per pre-operative assessment

## 2017-01-11 NOTE — Transfer of Care (Signed)
Immediate Anesthesia Transfer of Care Note  Patient: [name] Olano Sr.  Procedure(s) Performed: Procedure(s): Revision Left Transmetatarsal Amputation (Left)  Patient Location: PACU  Anesthesia Type:General  Level of Consciousness: sedated and patient cooperative  Airway & Oxygen Therapy: Patient Spontanous Breathing and Patient connected to face mask oxygen  Post-op Assessment: Report given to RN and Post -op Vital signs reviewed and stable  Post vital signs: Reviewed and stable  Last Vitals:  Vitals:   01/11/17 1140 01/11/17 1357  BP: (!) 186/84 (!) (P) 168/100  Pulse: 85 (P) 88  Resp: 18 (P) 12  Temp: 37 C (P) 36.4 C    Last Pain:  Vitals:   01/11/17 1235  TempSrc:   PainSc: 9       Patients Stated Pain Goal: 6 (58/59/29 2446)  Complications: No apparent anesthesia complications

## 2017-01-12 ENCOUNTER — Encounter (HOSPITAL_COMMUNITY): Payer: Self-pay | Admitting: Orthopedic Surgery

## 2017-01-14 NOTE — Addendum Note (Signed)
Addendum  created 01/14/17 1251 by Rica Koyanagi, MD   Sign clinical note

## 2017-01-20 ENCOUNTER — Ambulatory Visit (INDEPENDENT_AMBULATORY_CARE_PROVIDER_SITE_OTHER): Payer: Medicaid Other | Admitting: Orthopedic Surgery

## 2017-02-03 ENCOUNTER — Ambulatory Visit (INDEPENDENT_AMBULATORY_CARE_PROVIDER_SITE_OTHER): Payer: Medicaid Other | Admitting: Orthopedic Surgery

## 2017-02-08 ENCOUNTER — Ambulatory Visit (INDEPENDENT_AMBULATORY_CARE_PROVIDER_SITE_OTHER): Payer: Medicaid Other | Admitting: Orthopedic Surgery

## 2017-02-11 ENCOUNTER — Emergency Department (HOSPITAL_COMMUNITY): Payer: Medicaid Other

## 2017-02-11 ENCOUNTER — Encounter (HOSPITAL_COMMUNITY): Payer: Self-pay | Admitting: *Deleted

## 2017-02-11 ENCOUNTER — Emergency Department (HOSPITAL_COMMUNITY)
Admission: EM | Admit: 2017-02-11 | Discharge: 2017-02-11 | Disposition: A | Discharge status: Home / Self Care | Payer: Medicaid Other | Attending: Emergency Medicine | Admitting: Emergency Medicine

## 2017-02-11 DIAGNOSIS — Z794 Long term (current) use of insulin: Secondary | ICD-10-CM | POA: Insufficient documentation

## 2017-02-11 DIAGNOSIS — Z89422 Acquired absence of other left toe(s): Secondary | ICD-10-CM | POA: Diagnosis not present

## 2017-02-11 DIAGNOSIS — I110 Hypertensive heart disease with heart failure: Secondary | ICD-10-CM | POA: Diagnosis not present

## 2017-02-11 DIAGNOSIS — I120 Hypertensive chronic kidney disease with stage 5 chronic kidney disease or end stage renal disease: Secondary | ICD-10-CM | POA: Diagnosis not present

## 2017-02-11 DIAGNOSIS — G8929 Other chronic pain: Secondary | ICD-10-CM | POA: Diagnosis not present

## 2017-02-11 DIAGNOSIS — I2510 Atherosclerotic heart disease of native coronary artery without angina pectoris: Secondary | ICD-10-CM | POA: Insufficient documentation

## 2017-02-11 DIAGNOSIS — M79671 Pain in right foot: Secondary | ICD-10-CM | POA: Diagnosis not present

## 2017-02-11 DIAGNOSIS — F17210 Nicotine dependence, cigarettes, uncomplicated: Secondary | ICD-10-CM | POA: Insufficient documentation

## 2017-02-11 DIAGNOSIS — N186 End stage renal disease: Secondary | ICD-10-CM | POA: Insufficient documentation

## 2017-02-11 DIAGNOSIS — L089 Local infection of the skin and subcutaneous tissue, unspecified: Secondary | ICD-10-CM

## 2017-02-11 DIAGNOSIS — Z992 Dependence on renal dialysis: Secondary | ICD-10-CM | POA: Insufficient documentation

## 2017-02-11 DIAGNOSIS — Z79899 Other long term (current) drug therapy: Secondary | ICD-10-CM | POA: Diagnosis not present

## 2017-02-11 DIAGNOSIS — E119 Type 2 diabetes mellitus without complications: Secondary | ICD-10-CM | POA: Diagnosis not present

## 2017-02-11 DIAGNOSIS — I5042 Chronic combined systolic (congestive) and diastolic (congestive) heart failure: Secondary | ICD-10-CM | POA: Diagnosis not present

## 2017-02-11 DIAGNOSIS — M79674 Pain in right toe(s): Secondary | ICD-10-CM

## 2017-02-11 LAB — COMPREHENSIVE METABOLIC PANEL
ALK PHOS: 213 U/L — AB (ref 38–126)
ALT: 25 U/L (ref 17–63)
ANION GAP: 16 — AB (ref 5–15)
AST: 26 U/L (ref 15–41)
Albumin: 3.6 g/dL (ref 3.5–5.0)
BILIRUBIN TOTAL: 2.9 mg/dL — AB (ref 0.3–1.2)
BUN: 22 mg/dL — AB (ref 6–20)
CALCIUM: 9.1 mg/dL (ref 8.9–10.3)
CO2: 26 mmol/L (ref 22–32)
Chloride: 90 mmol/L — ABNORMAL LOW (ref 101–111)
Creatinine, Ser: 5.92 mg/dL — ABNORMAL HIGH (ref 0.61–1.24)
GFR calc Af Amer: 12 mL/min — ABNORMAL LOW (ref >60)
GFR calc non Af Amer: 11 mL/min — ABNORMAL LOW (ref >60)
GLUCOSE: 217 mg/dL — AB (ref 65–99)
Potassium: 3 mmol/L — ABNORMAL LOW (ref 3.5–5.1)
SODIUM: 132 mmol/L — AB (ref 135–145)
TOTAL PROTEIN: 7.3 g/dL (ref 6.5–8.1)

## 2017-02-11 LAB — CBC WITH DIFFERENTIAL/PLATELET
BASOS ABS: 0.1 10*3/uL (ref 0.0–0.1)
Basophils Relative: 1 %
EOS PCT: 0 %
Eosinophils Absolute: 0 10*3/uL (ref 0.0–0.7)
HCT: 35.6 % — ABNORMAL LOW (ref 39.0–52.0)
HEMOGLOBIN: 11.1 g/dL — AB (ref 13.0–17.0)
LYMPHS ABS: 1.6 10*3/uL (ref 0.7–4.0)
LYMPHS PCT: 18 %
MCH: 24.7 pg — ABNORMAL LOW (ref 26.0–34.0)
MCHC: 31.2 g/dL (ref 30.0–36.0)
MCV: 79.1 fL (ref 78.0–100.0)
MONOS PCT: 5 %
Monocytes Absolute: 0.5 10*3/uL (ref 0.1–1.0)
NEUTROS PCT: 76 %
Neutro Abs: 6.9 10*3/uL (ref 1.7–7.7)
Platelets: 318 10*3/uL (ref 150–400)
RBC: 4.5 MIL/uL (ref 4.22–5.81)
RDW: 22.6 % — ABNORMAL HIGH (ref 11.5–15.5)
WBC: 9.1 10*3/uL (ref 4.0–10.5)

## 2017-02-11 MED ORDER — DOXYCYCLINE HYCLATE 100 MG PO CAPS: 100.0000 mg | ORAL_CAPSULE | Freq: Two times a day (BID) | ORAL | 0 refills | Status: DC

## 2017-02-11 MED ORDER — MORPHINE SULFATE (PF) 4 MG/ML IV SOLN
6.0000 mg | Freq: Once | INTRAVENOUS | Status: AC
  Administered 2017-02-11: 6 mg via INTRAVENOUS
  Filled 2017-02-11: qty 2

## 2017-02-11 MED ORDER — OXYCODONE-ACETAMINOPHEN 5-325 MG PO TABS: 2.0000 | ORAL_TABLET | ORAL | 0 refills | Status: DC | PRN

## 2017-02-11 NOTE — ED Notes (Signed)
Patient transported to X-ray 

## 2017-02-11 NOTE — ED Provider Notes (Signed)
[facility] DEPT Provider Note   CSN: [id number] Arrival date & time: 02/11/17  1753     History   Chief Complaint Chief Complaint  Patient presents with  . Foot Pain    HPI [name] Saraceni Sr. is a 41 y.o. male.  HPI   Pt with hx CAD, CHF, DM, ESRD on dialysis, HTN, multiple toe amputations of bilateral feet p/w right second toe pain.  Pain has been chronic but worsened overnight at 3am.  Denies any injury to the foot.  Home percocet is not helping his pain.   Has had some vomiting and diarrhea over the past 5 days, not eating much.  Had full dialysis session today.  Denies known fevers, CP, SOB, cough, abdominal pain.  He does not make urine.  Had left foot partial amputation by Dr Sharol Given 1 month ago.  He reports it is healing well, had sutures removed 4 days ago.  Reports Dr Sharol Given is planning to remove the currently painful foot but there is no surgery planned yet.    Past Medical History:  Diagnosis Date  . Atherosclerosis of lower extremity ([place])   . CAD (coronary artery disease)   . Chronic combined systolic and diastolic heart failure ([place])   . Complication of anesthesia   . Diabetes mellitus    Type II  . Heart murmur   . History of blood transfusion   . Hypertension   . PONV (postoperative nausea and vomiting)   . Renal disorder dialysis   [name]    Patient Active Problem List   Diagnosis Date Noted  . Dehiscence of amputation stump ([place]) 01/04/2017  . S/P transmetatarsal amputation of foot, left ([place]) 11/16/2016  . Gangrene of right foot ([place]) 08/02/2016  . Achilles tendon contracture, left 08/02/2016  . Anemia of renal disease 08/16/2015  . Wound infection 08/15/2015  . Diabetic ulcer of right great toe ([place]) 08/15/2015  . Pain in the chest   . Elevated troponin   . Essential hypertension   . ESRD on dialysis ([place]) 08/07/2013  . Diabetes ([place]) 08/07/2013  . Chest pain 05/16/2012  . Murmur 05/16/2012  . Renal disorder     Past  Surgical History:  Procedure Laterality Date  . ABDOMINAL AORTOGRAM N/A 11/02/2016   Procedure: Abdominal Aortogram;  Surgeon: Waynetta Sandy, MD;  Location: [place] CV LAB;  Service: Cardiovascular;  Laterality: N/A;  . AMPUTATION Left 09/27/2013   Procedure: LEFT GREAT TOE AMPUTATION;  Surgeon: [name], MD;  Location: [place];  Service: Orthopedics;  Laterality: Left;  . AMPUTATION Right 08/15/2015   Procedure: Right Great Toe Amputation;  Surgeon: [name], MD;  Location: [place];  Service: Orthopedics;  Laterality: Right;  . AMPUTATION Left 11/05/2016   Procedure: TRANSMETATARSAL AMPUTATION LEFT FOOT;  Surgeon: [name], MD;  Location: [place];  Service: Orthopedics;  Laterality: Left;  . AV FISTULA PLACEMENT  left arm  . LEFT HEART CATHETERIZATION WITH CORONARY ANGIOGRAM N/A 09/13/2014   Procedure: LEFT HEART CATHETERIZATION WITH CORONARY ANGIOGRAM;  Surgeon: Sinclair Grooms, MD;  [facility];  Service: Cardiovascular;  Laterality: N/A;  . LOWER EXTREMITY ANGIOGRAPHY Bilateral 11/02/2016   Procedure: Lower Extremity Angiography;  Surgeon: Waynetta Sandy, MD;  Location: [place] CV LAB;  Service: Cardiovascular;  Laterality: Bilateral;  . PERIPHERAL VASCULAR ATHERECTOMY Left 11/02/2016   Procedure: Peripheral Vascular Atherectomy;  Surgeon: Waynetta Sandy, MD;  Location: [place] CV LAB;  Service: Cardiovascular;  Laterality: Left;  PERONEAL  . STUMP REVISION Left 01/11/2017   Procedure: Revision Left Transmetatarsal Amputation;  Surgeon: [name], MD;  Location: [place];  Service: Orthopedics;  Laterality: Left;       Home Medications    Prior to Admission medications   Medication Sig Start Date End Date Taking? Authorizing Provider  acetaminophen (TYLENOL) 500 MG tablet Take 1,000 mg by mouth every 6 (six) hours as needed for mild pain.    [provider]  amLODipine (NORVASC) 10 MG tablet Take 1 tablet (10 mg  total) by mouth at bedtime. 08/16/15   [name], MD  calcium acetate (PHOSLO) 667 MG capsule Take 1 capsule (667 mg total) by mouth 3 (three) times daily with meals. Patient taking differently: Take 1,334 mg by mouth 3 (three) times daily with meals.  08/16/15   [name], MD  clopidogrel (PLAVIX) 75 MG tablet Take 1 tablet (75 mg total) by mouth daily. 11/02/16   Waynetta Sandy, MD  glucagon (GLUCAGON EMERGENCY) 1 MG injection Inject 1 mg into the vein once as needed (low blood sugar and inability to eat or drink sugar). 08/16/15   [name], MD  insulin glargine (LANTUS) 100 UNIT/ML injection Inject 0.1 mLs (10 Units total) into the skin at bedtime. Patient taking differently: Inject 12 Units into the skin at bedtime.  09/15/14   Francesca Oman, DO  lanthanum (FOSRENOL) 1000 MG chewable tablet Chew 2 tablets (2,000 mg total) by mouth 3 (three) times daily with meals. Patient taking differently: Chew 2,000 mg by mouth 3 (three) times daily with meals. May take an additional 1000 - 2000 mgs with snacks 08/16/15   [name], MD  oxyCODONE-acetaminophen (ROXICET) 5-325 MG tablet Take 1 tablet by mouth every 4 (four) hours as needed for severe pain. 01/11/17   [name], MD    Family History Family History  Problem Relation Age of Onset  . Heart failure Mother   . Hypertension Mother     Social History Social History  Substance Use Topics  . Smoking status: Current Every Day Smoker    Packs/day: 0.50    Years: 20.00    Types: Cigarettes    Last attempt to quit: 09/27/2014  . Smokeless tobacco: Never Used  . Alcohol use No     Allergies   Coconut oil   Review of Systems Review of Systems  All other systems reviewed and are negative.    Physical Exam Updated Vital Signs BP (!) 194/119 (BP Location: Right Arm)   Pulse (!) 108   Temp 98.9 F (37.2 C) (Oral)   Resp 20   SpO2 98%   Physical Exam  Constitutional: He appears  well-developed and well-nourished. No distress.  Uncomfortable appearing   HENT:  Head: Normocephalic and atraumatic.  Neck: Neck supple.  Cardiovascular: Regular rhythm.  Tachycardia present.   Pulmonary/Chest: Effort normal and breath sounds normal. No respiratory distress. He has no wheezes. He has no rales.  Abdominal: Soft. He exhibits no distension and no mass. There is no tenderness. There is no rebound and no guarding.  Musculoskeletal:  Left foot partially amputated - in clean ace wrap.   Right foot with 1st toe previously (remotely) amputated.  2nd toe with chronic necrosis, tender to palpation.  Not able to palpate dorsalis pedis pulse.  Foot is warm.  No calf swelling or tenderness.    Neurological: He is alert. He exhibits normal muscle tone.  Skin: He is not  diaphoretic.  Nursing note and vitals reviewed.        ED Treatments / Results  Labs (all labs ordered are listed, but only abnormal results are displayed) Labs Reviewed  CBC WITH DIFFERENTIAL/PLATELET - Abnormal; Notable for the following:       Result Value   Hemoglobin 11.1 (*)    HCT 35.6 (*)    MCH 24.7 (*)    RDW 22.6 (*)    All other components within normal limits  COMPREHENSIVE METABOLIC PANEL - Abnormal; Notable for the following:    Sodium 132 (*)    Potassium 3.0 (*)    Chloride 90 (*)    Glucose, Bld 217 (*)    BUN 22 (*)    Creatinine, Ser 5.92 (*)    Alkaline Phosphatase 213 (*)    Total Bilirubin 2.9 (*)    GFR calc non Af Amer 11 (*)    GFR calc Af Amer 12 (*)    Anion gap 16 (*)    All other components within normal limits    EKG  EKG Interpretation None       Radiology No results found.  Procedures Procedures (including critical care time)  Medications Ordered in ED Medications  morphine 4 MG/ML injection 6 mg (6 mg Intravenous Given 02/11/17 1939)     Initial Impression / Assessment and Plan / ED Course  I have reviewed the triage vital signs and the nursing  notes.  Pertinent labs & imaging results that were available during my care of the patient were reviewed by me and considered in my medical decision making (see chart for details).     Afebrile nontoxic patient with chronic toe pain from chronic necrosis.  Increase in pain.  Labs are baseline.  Pending doppler of foot for pulse check and foot xray at change of shift.  Signed out to [employer], PA-C.    Final Clinical Impressions(s) / ED Diagnoses   Final diagnoses:  Chronic toe pain, right foot    New Prescriptions New Prescriptions   No medications on file     Clayton Bibles, Hershal Coria 02/11/17 2007    Deno Etienne, DO 02/11/17 2008

## 2017-02-11 NOTE — Discharge Instructions (Signed)
Please read attached information. If you experience any new or worsening signs or symptoms please return to the emergency room for evaluation. Please follow-up with your primary care provider or specialist as discussed. Please use medication prescribed only as directed and discontinue taking if you have any concerning signs or symptoms.   °

## 2017-02-11 NOTE — ED Provider Notes (Signed)
  Patient care signed out to me at shift change pending plain films and disposition.  Please see previous providers note for full H&P.  41 year old male with complaints of right foot and toe pain.  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 On my exam patient has warm and well-perfused foot with a dopplerable pedal pulse. His toe appears chronically infected no signs of significant surrounding cellulitis to the foot.  Films show no acute osseous involvement.  Patient is under the care of [name] to, they have discussed amputation in the future after left foot heals from previous surgery.  I see no indication for immediate management in the ED or hospital setting of this toe.  Patient will be placed on doxycycline as an outpatient, follow-up with Dr. due to, return precautions given.  Vitals:   02/11/17 2015 02/11/17 2100  BP: (!) 173/82 122/81  Pulse: (!) 103 98  Resp:    Temp:       Okey Regal, PA-C 02/11/17 2133    Deno Etienne, DO 02/11/17 2150

## 2017-02-11 NOTE — ED Triage Notes (Addendum)
Pt in via [facility], per report pt has bil foot amputations, pt here today d/t pain at necrotic R 2nd toe, pt has DP pulses, pt denies fever & chills, A&O x4, pt rcvd full HD today

## 2017-03-07 ENCOUNTER — Inpatient Hospital Stay (HOSPITAL_COMMUNITY)
Admission: EM | Admit: 2017-03-07 | Discharge: 2017-03-17 | DRG: 617 | Disposition: A | Discharge status: Skilled Nursing Facility | Payer: Medicaid Other | Attending: Internal Medicine | Admitting: Internal Medicine

## 2017-03-07 ENCOUNTER — Emergency Department (HOSPITAL_COMMUNITY): Payer: Medicaid Other

## 2017-03-07 DIAGNOSIS — I132 Hypertensive heart and chronic kidney disease with heart failure and with stage 5 chronic kidney disease, or end stage renal disease: Secondary | ICD-10-CM | POA: Diagnosis present

## 2017-03-07 DIAGNOSIS — G8918 Other acute postprocedural pain: Secondary | ICD-10-CM

## 2017-03-07 DIAGNOSIS — Z8249 Family history of ischemic heart disease and other diseases of the circulatory system: Secondary | ICD-10-CM

## 2017-03-07 DIAGNOSIS — Z7902 Long term (current) use of antithrombotics/antiplatelets: Secondary | ICD-10-CM

## 2017-03-07 DIAGNOSIS — Z89422 Acquired absence of other left toe(s): Secondary | ICD-10-CM

## 2017-03-07 DIAGNOSIS — Z720 Tobacco use: Secondary | ICD-10-CM

## 2017-03-07 DIAGNOSIS — L089 Local infection of the skin and subcutaneous tissue, unspecified: Secondary | ICD-10-CM | POA: Diagnosis present

## 2017-03-07 DIAGNOSIS — I10 Essential (primary) hypertension: Secondary | ICD-10-CM | POA: Diagnosis present

## 2017-03-07 DIAGNOSIS — E1022 Type 1 diabetes mellitus with diabetic chronic kidney disease: Secondary | ICD-10-CM

## 2017-03-07 DIAGNOSIS — D638 Anemia in other chronic diseases classified elsewhere: Secondary | ICD-10-CM | POA: Diagnosis present

## 2017-03-07 DIAGNOSIS — Z89512 Acquired absence of left leg below knee: Secondary | ICD-10-CM

## 2017-03-07 DIAGNOSIS — F17210 Nicotine dependence, cigarettes, uncomplicated: Secondary | ICD-10-CM | POA: Diagnosis present

## 2017-03-07 DIAGNOSIS — N186 End stage renal disease: Secondary | ICD-10-CM

## 2017-03-07 DIAGNOSIS — E1152 Type 2 diabetes mellitus with diabetic peripheral angiopathy with gangrene: Secondary | ICD-10-CM | POA: Diagnosis present

## 2017-03-07 DIAGNOSIS — I5042 Chronic combined systolic (congestive) and diastolic (congestive) heart failure: Secondary | ICD-10-CM | POA: Diagnosis present

## 2017-03-07 DIAGNOSIS — T8130XA Disruption of wound, unspecified, initial encounter: Secondary | ICD-10-CM

## 2017-03-07 DIAGNOSIS — Z794 Long term (current) use of insulin: Secondary | ICD-10-CM

## 2017-03-07 DIAGNOSIS — Z89511 Acquired absence of right leg below knee: Secondary | ICD-10-CM

## 2017-03-07 DIAGNOSIS — Z89411 Acquired absence of right great toe: Secondary | ICD-10-CM

## 2017-03-07 DIAGNOSIS — I472 Ventricular tachycardia: Secondary | ICD-10-CM | POA: Diagnosis not present

## 2017-03-07 DIAGNOSIS — Z992 Dependence on renal dialysis: Secondary | ICD-10-CM

## 2017-03-07 DIAGNOSIS — N2581 Secondary hyperparathyroidism of renal origin: Secondary | ICD-10-CM | POA: Diagnosis present

## 2017-03-07 DIAGNOSIS — E8889 Other specified metabolic disorders: Secondary | ICD-10-CM | POA: Diagnosis present

## 2017-03-07 DIAGNOSIS — M869 Osteomyelitis, unspecified: Secondary | ICD-10-CM

## 2017-03-07 DIAGNOSIS — E871 Hypo-osmolality and hyponatremia: Secondary | ICD-10-CM | POA: Diagnosis not present

## 2017-03-07 DIAGNOSIS — N189 Chronic kidney disease, unspecified: Secondary | ICD-10-CM | POA: Diagnosis present

## 2017-03-07 DIAGNOSIS — M86172 Other acute osteomyelitis, left ankle and foot: Secondary | ICD-10-CM | POA: Diagnosis present

## 2017-03-07 DIAGNOSIS — Z79899 Other long term (current) drug therapy: Secondary | ICD-10-CM

## 2017-03-07 DIAGNOSIS — D62 Acute posthemorrhagic anemia: Secondary | ICD-10-CM

## 2017-03-07 DIAGNOSIS — E11628 Type 2 diabetes mellitus with other skin complications: Secondary | ICD-10-CM | POA: Diagnosis present

## 2017-03-07 DIAGNOSIS — E118 Type 2 diabetes mellitus with unspecified complications: Secondary | ICD-10-CM

## 2017-03-07 DIAGNOSIS — D631 Anemia in chronic kidney disease: Secondary | ICD-10-CM | POA: Diagnosis present

## 2017-03-07 DIAGNOSIS — T8781 Dehiscence of amputation stump: Secondary | ICD-10-CM | POA: Diagnosis present

## 2017-03-07 DIAGNOSIS — I5020 Unspecified systolic (congestive) heart failure: Secondary | ICD-10-CM

## 2017-03-07 DIAGNOSIS — E1169 Type 2 diabetes mellitus with other specified complication: Principal | ICD-10-CM | POA: Diagnosis present

## 2017-03-07 DIAGNOSIS — E1122 Type 2 diabetes mellitus with diabetic chronic kidney disease: Secondary | ICD-10-CM | POA: Diagnosis present

## 2017-03-07 DIAGNOSIS — Z89432 Acquired absence of left foot: Secondary | ICD-10-CM

## 2017-03-07 DIAGNOSIS — E119 Type 2 diabetes mellitus without complications: Secondary | ICD-10-CM

## 2017-03-07 DIAGNOSIS — I739 Peripheral vascular disease, unspecified: Secondary | ICD-10-CM

## 2017-03-07 DIAGNOSIS — I2510 Atherosclerotic heart disease of native coronary artery without angina pectoris: Secondary | ICD-10-CM

## 2017-03-07 DIAGNOSIS — Z89412 Acquired absence of left great toe: Secondary | ICD-10-CM

## 2017-03-07 DIAGNOSIS — I25110 Atherosclerotic heart disease of native coronary artery with unstable angina pectoris: Secondary | ICD-10-CM

## 2017-03-07 DIAGNOSIS — Y835 Amputation of limb(s) as the cause of abnormal reaction of the patient, or of later complication, without mention of misadventure at the time of the procedure: Secondary | ICD-10-CM | POA: Diagnosis present

## 2017-03-07 HISTORY — DX: Type 2 diabetes mellitus without complications: E11.9

## 2017-03-07 HISTORY — DX: Gastro-esophageal reflux disease without esophagitis: K21.9

## 2017-03-07 HISTORY — DX: Dependence on renal dialysis: N18.6

## 2017-03-07 HISTORY — DX: End stage renal disease: Z99.2

## 2017-03-07 HISTORY — DX: Anemia, unspecified: D64.9

## 2017-03-07 LAB — CBC WITH DIFFERENTIAL/PLATELET
BASOS PCT: 1 %
Basophils Absolute: 0.1 10*3/uL (ref 0.0–0.1)
EOS PCT: 5 %
Eosinophils Absolute: 0.3 10*3/uL (ref 0.0–0.7)
HCT: 34 % — ABNORMAL LOW (ref 39.0–52.0)
Hemoglobin: 11.1 g/dL — ABNORMAL LOW (ref 13.0–17.0)
Lymphocytes Relative: 25 %
Lymphs Abs: 1.5 10*3/uL (ref 0.7–4.0)
MCH: 24.1 pg — ABNORMAL LOW (ref 26.0–34.0)
MCHC: 32.6 g/dL (ref 30.0–36.0)
MCV: 73.8 fL — AB (ref 78.0–100.0)
MONO ABS: 0.2 10*3/uL (ref 0.1–1.0)
Monocytes Relative: 4 %
Neutro Abs: 3.8 10*3/uL (ref 1.7–7.7)
Neutrophils Relative %: 65 %
PLATELETS: 233 10*3/uL (ref 150–400)
RBC: 4.61 MIL/uL (ref 4.22–5.81)
RDW: 22 % — ABNORMAL HIGH (ref 11.5–15.5)
WBC: 5.9 10*3/uL (ref 4.0–10.5)

## 2017-03-07 LAB — COMPREHENSIVE METABOLIC PANEL
ALT: 14 U/L — AB (ref 17–63)
AST: 25 U/L (ref 15–41)
Albumin: 3.5 g/dL (ref 3.5–5.0)
Alkaline Phosphatase: 137 U/L — ABNORMAL HIGH (ref 38–126)
Anion gap: 18 — ABNORMAL HIGH (ref 5–15)
BUN: 40 mg/dL — AB (ref 6–20)
CHLORIDE: 93 mmol/L — AB (ref 101–111)
CO2: 23 mmol/L (ref 22–32)
CREATININE: 10.64 mg/dL — AB (ref 0.61–1.24)
Calcium: 8.6 mg/dL — ABNORMAL LOW (ref 8.9–10.3)
GFR calc Af Amer: 6 mL/min — ABNORMAL LOW (ref >60)
GFR calc non Af Amer: 5 mL/min — ABNORMAL LOW (ref >60)
Glucose, Bld: 103 mg/dL — ABNORMAL HIGH (ref 65–99)
Potassium: 3.5 mmol/L (ref 3.5–5.1)
Sodium: 134 mmol/L — ABNORMAL LOW (ref 135–145)
Total Bilirubin: 1.5 mg/dL — ABNORMAL HIGH (ref 0.3–1.2)
Total Protein: 7.1 g/dL (ref 6.5–8.1)

## 2017-03-07 LAB — I-STAT CG4 LACTIC ACID, ED: LACTIC ACID, VENOUS: 1.96 mmol/L — AB (ref 0.5–1.9)

## 2017-03-07 MED ORDER — ONDANSETRON HCL 4 MG/2ML IJ SOLN
4.0000 mg | Freq: Once | INTRAMUSCULAR | Status: AC
  Administered 2017-03-07: 4 mg via INTRAVENOUS
  Filled 2017-03-07: qty 2

## 2017-03-07 MED ORDER — MORPHINE SULFATE (PF) 4 MG/ML IV SOLN
4.0000 mg | Freq: Once | INTRAVENOUS | Status: AC
  Administered 2017-03-07: 4 mg via INTRAVENOUS
  Filled 2017-03-07: qty 1

## 2017-03-07 MED ORDER — SODIUM CHLORIDE 0.9 % IV SOLN
INTRAVENOUS | Status: DC
  Administered 2017-03-08 (×2): via INTRAVENOUS

## 2017-03-07 NOTE — ED Notes (Signed)
RN only able to get a small amount of blood, phlebotomy called to get the rest

## 2017-03-07 NOTE — ED Provider Notes (Signed)
[place] DEPT Provider Note   CSN: [id number] Arrival date & time: 03/07/17  1948     History   Chief Complaint Chief Complaint  Patient presents with  . Foot Pain   HPI   Blood pressure (!) 157/88, pulse 99, temperature 97.7 F (36.5 C), temperature source Oral, resp. rate (!) 24, height 6\' 2"  (1.88 m), weight 84 kg (185 lb 3 oz), SpO2 97 %.  [name] Zahner Sr. is a 41 y.o. male with past medical history significant for CAD, CHF, insulin-dependent diabetes, ESRD on dialysis (fully dialyzed today) complaining of worsening pain and drainage from dusky toe on right foot. He had multiple amputations to the left foot and he feels like the incision site from that wound is infection, infected. He states that it's draining clear material. He denies fever, chills, nausea or vomiting but states that he's had diarrhea. He's been on doxycycline for 1 month and feels that it is not helping.  Surgeon: Sharol Given  Past Medical History:  Diagnosis Date  . Atherosclerosis of lower extremity ([place])   . CAD (coronary artery disease)   . Chronic combined systolic and diastolic heart failure ([place])   . Complication of anesthesia   . Diabetes mellitus    Type II  . Heart murmur   . History of blood transfusion   . Hypertension   . PONV (postoperative nausea and vomiting)   . Renal disorder dialysis   [name]    Patient Active Problem List   Diagnosis Date Noted  . Diabetic foot infection ([place]) 03/08/2017  . Dehiscence of amputation stump ([place]) 01/04/2017  . S/P transmetatarsal amputation of foot, left ([place]) 11/16/2016  . Gangrene of right foot ([place]) 08/02/2016  . Achilles tendon contracture, left 08/02/2016  . Anemia of renal disease 08/16/2015  . Wound infection 08/15/2015  . Diabetic ulcer of right great toe ([place]) 08/15/2015  . Pain in the chest   . Elevated troponin   . Essential hypertension   . ESRD on dialysis ([place]) 08/07/2013  . Diabetes ([place]) 08/07/2013  . Chest  pain 05/16/2012  . Murmur 05/16/2012  . Renal disorder     Past Surgical History:  Procedure Laterality Date  . ABDOMINAL AORTOGRAM N/A 11/02/2016   Procedure: Abdominal Aortogram;  Surgeon: Waynetta Sandy, MD;  Location: [place] CV LAB;  Service: Cardiovascular;  Laterality: N/A;  . AMPUTATION Left 09/27/2013   Procedure: LEFT GREAT TOE AMPUTATION;  Surgeon: [name], MD;  Location: [place];  Service: Orthopedics;  Laterality: Left;  . AMPUTATION Right 08/15/2015   Procedure: Right Great Toe Amputation;  Surgeon: [name], MD;  Location: [place];  Service: Orthopedics;  Laterality: Right;  . AMPUTATION Left 11/05/2016   Procedure: TRANSMETATARSAL AMPUTATION LEFT FOOT;  Surgeon: [name], MD;  Location: [place];  Service: Orthopedics;  Laterality: Left;  . AV FISTULA PLACEMENT  left arm  . LEFT HEART CATHETERIZATION WITH CORONARY ANGIOGRAM N/A 09/13/2014   Procedure: LEFT HEART CATHETERIZATION WITH CORONARY ANGIOGRAM;  Surgeon: Sinclair Grooms, MD;  Location: [facility] CATH LAB;  Service: Cardiovascular;  Laterality: N/A;  . LOWER EXTREMITY ANGIOGRAPHY Bilateral 11/02/2016   Procedure: Lower Extremity Angiography;  Surgeon: Waynetta Sandy, MD;  Location: [place] CV LAB;  Service: Cardiovascular;  Laterality: Bilateral;  . PERIPHERAL VASCULAR ATHERECTOMY Left 11/02/2016   Procedure: Peripheral Vascular Atherectomy;  Surgeon: Waynetta Sandy, MD;  Location: [place] CV LAB;  Service: Cardiovascular;  Laterality: Left;  PERONEAL  .  STUMP REVISION Left 01/11/2017   Procedure: Revision Left Transmetatarsal Amputation;  Surgeon: [name], MD;  Location: [place];  Service: Orthopedics;  Laterality: Left;       Home Medications    Prior to Admission medications   Medication Sig Start Date End Date Taking? Authorizing Provider  acetaminophen (TYLENOL) 500 MG tablet Take 1,000 mg by mouth every 6 (six) hours as needed for mild pain.   Yes [provider]  amLODipine (NORVASC) 10 MG tablet Take 1 tablet (10 mg total) by mouth at bedtime. 08/16/15  Yes Short, Noah Delaine, MD  calcium acetate (PHOSLO) 667 MG capsule Take 1 capsule (667 mg total) by mouth 3 (three) times daily with meals. Patient taking differently: Take 1,334 mg by mouth 3 (three) times daily with meals.  08/16/15  Yes Short, Noah Delaine, MD  clopidogrel (PLAVIX) 75 MG tablet Take 1 tablet (75 mg total) by mouth daily. 11/02/16  Yes Waynetta Sandy, MD  doxycycline (VIBRAMYCIN) 100 MG capsule Take 1 capsule (100 mg total) by mouth 2 (two) times daily. 02/11/17  Yes Hedges, Dellis Filbert, PA-C  glucagon (GLUCAGON EMERGENCY) 1 MG injection Inject 1 mg into the vein once as needed (low blood sugar and inability to eat or drink sugar). 08/16/15  Yes Short, Noah Delaine, MD  insulin glargine (LANTUS) 100 UNIT/ML injection Inject 0.1 mLs (10 Units total) into the skin at bedtime. Patient taking differently: Inject 5 Units into the skin at bedtime.  09/15/14  Yes Francesca Oman, DO  lanthanum (FOSRENOL) 1000 MG chewable tablet Chew 2 tablets (2,000 mg total) by mouth 3 (three) times daily with meals. Patient taking differently: Chew 1,000 mg by mouth 3 (three) times daily with meals. May take an additional 1000 - 2000 mgs with snacks 08/16/15  Yes Short, Mackenzie, MD  oxyCODONE-acetaminophen (PERCOCET/ROXICET) 5-325 MG tablet Take 2 tablets by mouth every 4 (four) hours as needed for severe pain. 02/11/17  Yes Hedges, Dellis Filbert, PA-C    Family History Family History  Problem Relation Age of Onset  . Heart failure Mother   . Hypertension Mother     Social History Social History  Substance Use Topics  . Smoking status: Current Every Day Smoker    Packs/day: 0.50    Years: 20.00    Types: Cigarettes    Last attempt to quit: 09/27/2014  . Smokeless tobacco: Never Used  . Alcohol use No     Allergies   Coconut oil   Review of Systems Review of Systems  A complete  review of systems was obtained and all systems are negative except as noted in the HPI and PMH.    Physical Exam Updated Vital Signs BP (!) 173/95 (BP Location: Right Arm)   Pulse 92   Temp 97.7 F (36.5 C) (Oral)   Resp 18   Ht 6\' 2"  (1.88 m)   Wt 84 kg (185 lb 3 oz)   SpO2 92%   BMI 23.78 kg/m   Physical Exam       ED Treatments / Results  Labs (all labs ordered are listed, but only abnormal results are displayed) Labs Reviewed  COMPREHENSIVE METABOLIC PANEL - Abnormal; Notable for the following:       Result Value   Sodium 134 (*)    Chloride 93 (*)    Glucose, Bld 103 (*)    BUN 40 (*)    Creatinine, Ser 10.64 (*)    Calcium 8.6 (*)    ALT 14 (*)  Alkaline Phosphatase 137 (*)    Total Bilirubin 1.5 (*)    GFR calc non Af Amer 5 (*)    GFR calc Af Amer 6 (*)    Anion gap 18 (*)    All other components within normal limits  CBC WITH DIFFERENTIAL/PLATELET - Abnormal; Notable for the following:    Hemoglobin 11.1 (*)    HCT 34.0 (*)    MCV 73.8 (*)    MCH 24.1 (*)    RDW 22.0 (*)    All other components within normal limits  I-STAT CG4 LACTIC ACID, ED - Abnormal; Notable for the following:    Lactic Acid, Venous 1.96 (*)    All other components within normal limits  CULTURE, BLOOD (ROUTINE X 2)  CULTURE, BLOOD (ROUTINE X 2)  URINALYSIS, ROUTINE W REFLEX MICROSCOPIC  I-STAT CG4 LACTIC ACID, ED    EKG  EKG Interpretation None       Radiology Dg Foot Complete Left  Result Date: 03/07/2017 CLINICAL DATA:  Diabetic foot ulcer EXAM: LEFT FOOT - COMPLETE 3+ VIEW COMPARISON:  09/11/2016 FINDINGS: There is been interval transmetatarsal amputation performed. Vascular calcifications are seen. A large soft tissue wound is noted along the stump. Some lucency is noted within the residual first and second metatarsals suggestive of osteomyelitis. No other focal abnormality is seen. IMPRESSION: Large soft tissue wound with changes of erosion in the first and  second metatarsal remnants suggestive of osteomyelitis. Electronically Signed   By: Inez Catalina M.D.   On: 03/07/2017 22:46   Dg Foot Complete Right  Result Date: 03/07/2017 CLINICAL DATA:  Diabetic foot ulcer with second toe pain, initial encounter EXAM: RIGHT FOOT COMPLETE - 3+ VIEW COMPARISON:  02/11/2017 FINDINGS: First toe amputation is again identified. Diffuse vascular calcifications are seen. The diabetic foot wound is not well appreciated on this exam. No bony erosion to suggest osteomyelitis is noted. IMPRESSION: Prior surgical change.  No definitive osteomyelitis is noted. Electronically Signed   By: Inez Catalina M.D.   On: 03/07/2017 22:47    Procedures Procedures (including critical care time)  Medications Ordered in ED Medications  0.9 %  sodium chloride infusion (not administered)  vancomycin (VANCOCIN) IVPB 1000 mg/200 mL premix (not administered)  piperacillin-tazobactam (ZOSYN) IVPB 3.375 g (not administered)  oxyCODONE-acetaminophen (PERCOCET/ROXICET) 5-325 MG per tablet 2 tablet (not administered)  amLODipine (NORVASC) tablet 10 mg (not administered)  calcium acetate (PHOSLO) capsule 1,334 mg (not administered)  lanthanum (FOSRENOL) chewable tablet 2,000 mg (not administered)  acetaminophen (TYLENOL) tablet 650 mg (not administered)  insulin glargine (LANTUS) injection 7 Units (not administered)  insulin aspart (novoLOG) injection 0-9 Units (not administered)  hydrALAZINE (APRESOLINE) injection 5 mg (not administered)  ondansetron (ZOFRAN) injection 4 mg (not administered)  zolpidem (AMBIEN) tablet 5 mg (not administered)  nicotine (NICODERM CQ - dosed in mg/24 hours) patch 21 mg (not administered)  morphine 4 MG/ML injection 4 mg (4 mg Intravenous Given 03/07/17 2213)  ondansetron (ZOFRAN) injection 4 mg (4 mg Intravenous Given 03/07/17 2211)     Initial Impression / Assessment and Plan / ED Course  I have reviewed the triage vital signs and the nursing  notes.  Pertinent labs & imaging results that were available during my care of the patient were reviewed by me and considered in my medical decision making (see chart for details).     Vitals:   03/07/17 1951 03/07/17 2300 03/08/17 0022  BP: (!) 157/88 (!) 178/95 (!) 173/95  Pulse: 99 92 92  Resp: (!) 24  18  Temp: 97.7 F (36.5 C)    TempSrc: Oral    SpO2: 97% 99% 92%  Weight: 84 kg (185 lb 3 oz)    Height: 6\' 2"  (1.88 m)      Medications  0.9 %  sodium chloride infusion (not administered)  vancomycin (VANCOCIN) IVPB 1000 mg/200 mL premix (not administered)  piperacillin-tazobactam (ZOSYN) IVPB 3.375 g (not administered)  oxyCODONE-acetaminophen (PERCOCET/ROXICET) 5-325 MG per tablet 2 tablet (not administered)  amLODipine (NORVASC) tablet 10 mg (not administered)  calcium acetate (PHOSLO) capsule 1,334 mg (not administered)  lanthanum (FOSRENOL) chewable tablet 2,000 mg (not administered)  acetaminophen (TYLENOL) tablet 650 mg (not administered)  insulin glargine (LANTUS) injection 7 Units (not administered)  insulin aspart (novoLOG) injection 0-9 Units (not administered)  hydrALAZINE (APRESOLINE) injection 5 mg (not administered)  ondansetron (ZOFRAN) injection 4 mg (not administered)  zolpidem (AMBIEN) tablet 5 mg (not administered)  nicotine (NICODERM CQ - dosed in mg/24 hours) patch 21 mg (not administered)  morphine 4 MG/ML injection 4 mg (4 mg Intravenous Given 03/07/17 2213)  ondansetron (ZOFRAN) injection 4 mg (4 mg Intravenous Given 03/07/17 2211)    [name] is 41 y.o. male presenting with Worsening dry gangrene to the right seconds toe when compared with chart images from 6/29. Patient has an open wound to the left foot, this does not appear to be grossly infected. He states that there is foul-smelling discharge. X-ray is consistent with osteomyelitis. Case discussed with triad hospitalist Dr. Blaine Hamper who accepts admission.   Final Clinical  Impressions(s) / ED Diagnoses   Final diagnoses:  Diabetic foot infection [facility])    New Prescriptions New Prescriptions   No medications on file     Waynetta Pean 03/08/17 [place], [place], MD 03/14/17 [phone]

## 2017-03-07 NOTE — ED Notes (Signed)
Patient transported to X-ray 

## 2017-03-07 NOTE — ED Triage Notes (Signed)
Per [facility],  Pt complaining of pain in both feet. 2 months ago, pt had all of the toes on the L foot amputated. Pt has also had his great toe on his R foot amputated. Pt has history of DM, HTN, dialysis. Pt reports 2 weeks ago he noted a foul smell coming from his foot. Pt was seen and started on antibiotics. Pt reports no improvement since. Pt is also complaining of diarrhea for past 4-5 days. Pt's 2nd toe on R foot is darkened. L foot has an open wound with bloody exudate. Pt alert and oriented

## 2017-03-08 ENCOUNTER — Encounter (HOSPITAL_COMMUNITY): Payer: Self-pay | Admitting: General Practice

## 2017-03-08 DIAGNOSIS — Z89432 Acquired absence of left foot: Secondary | ICD-10-CM | POA: Diagnosis not present

## 2017-03-08 DIAGNOSIS — E11628 Type 2 diabetes mellitus with other skin complications: Secondary | ICD-10-CM | POA: Diagnosis present

## 2017-03-08 DIAGNOSIS — F17210 Nicotine dependence, cigarettes, uncomplicated: Secondary | ICD-10-CM | POA: Diagnosis present

## 2017-03-08 DIAGNOSIS — M869 Osteomyelitis, unspecified: Secondary | ICD-10-CM

## 2017-03-08 DIAGNOSIS — Z794 Long term (current) use of insulin: Secondary | ICD-10-CM | POA: Diagnosis not present

## 2017-03-08 DIAGNOSIS — Z89422 Acquired absence of other left toe(s): Secondary | ICD-10-CM | POA: Diagnosis not present

## 2017-03-08 DIAGNOSIS — E1022 Type 1 diabetes mellitus with diabetic chronic kidney disease: Secondary | ICD-10-CM | POA: Diagnosis not present

## 2017-03-08 DIAGNOSIS — I472 Ventricular tachycardia: Secondary | ICD-10-CM | POA: Diagnosis not present

## 2017-03-08 DIAGNOSIS — D638 Anemia in other chronic diseases classified elsewhere: Secondary | ICD-10-CM | POA: Diagnosis present

## 2017-03-08 DIAGNOSIS — G8918 Other acute postprocedural pain: Secondary | ICD-10-CM | POA: Diagnosis not present

## 2017-03-08 DIAGNOSIS — R52 Pain, unspecified: Secondary | ICD-10-CM | POA: Diagnosis not present

## 2017-03-08 DIAGNOSIS — E118 Type 2 diabetes mellitus with unspecified complications: Secondary | ICD-10-CM

## 2017-03-08 DIAGNOSIS — M86172 Other acute osteomyelitis, left ankle and foot: Secondary | ICD-10-CM | POA: Diagnosis present

## 2017-03-08 DIAGNOSIS — D631 Anemia in chronic kidney disease: Secondary | ICD-10-CM | POA: Diagnosis not present

## 2017-03-08 DIAGNOSIS — N186 End stage renal disease: Secondary | ICD-10-CM | POA: Diagnosis present

## 2017-03-08 DIAGNOSIS — Z89411 Acquired absence of right great toe: Secondary | ICD-10-CM | POA: Diagnosis not present

## 2017-03-08 DIAGNOSIS — E871 Hypo-osmolality and hyponatremia: Secondary | ICD-10-CM | POA: Diagnosis not present

## 2017-03-08 DIAGNOSIS — I2510 Atherosclerotic heart disease of native coronary artery without angina pectoris: Secondary | ICD-10-CM | POA: Diagnosis present

## 2017-03-08 DIAGNOSIS — E1122 Type 2 diabetes mellitus with diabetic chronic kidney disease: Secondary | ICD-10-CM | POA: Diagnosis present

## 2017-03-08 DIAGNOSIS — I739 Peripheral vascular disease, unspecified: Secondary | ICD-10-CM | POA: Diagnosis not present

## 2017-03-08 DIAGNOSIS — T8130XA Disruption of wound, unspecified, initial encounter: Secondary | ICD-10-CM

## 2017-03-08 DIAGNOSIS — T8781 Dehiscence of amputation stump: Secondary | ICD-10-CM | POA: Diagnosis present

## 2017-03-08 DIAGNOSIS — E1152 Type 2 diabetes mellitus with diabetic peripheral angiopathy with gangrene: Secondary | ICD-10-CM | POA: Diagnosis present

## 2017-03-08 DIAGNOSIS — E8889 Other specified metabolic disorders: Secondary | ICD-10-CM | POA: Diagnosis present

## 2017-03-08 DIAGNOSIS — D62 Acute posthemorrhagic anemia: Secondary | ICD-10-CM | POA: Diagnosis not present

## 2017-03-08 DIAGNOSIS — E1169 Type 2 diabetes mellitus with other specified complication: Secondary | ICD-10-CM | POA: Diagnosis present

## 2017-03-08 DIAGNOSIS — I5042 Chronic combined systolic (congestive) and diastolic (congestive) heart failure: Secondary | ICD-10-CM | POA: Diagnosis present

## 2017-03-08 DIAGNOSIS — I132 Hypertensive heart and chronic kidney disease with heart failure and with stage 5 chronic kidney disease, or end stage renal disease: Secondary | ICD-10-CM | POA: Diagnosis present

## 2017-03-08 DIAGNOSIS — L089 Local infection of the skin and subcutaneous tissue, unspecified: Secondary | ICD-10-CM | POA: Diagnosis present

## 2017-03-08 DIAGNOSIS — Y835 Amputation of limb(s) as the cause of abnormal reaction of the patient, or of later complication, without mention of misadventure at the time of the procedure: Secondary | ICD-10-CM | POA: Diagnosis present

## 2017-03-08 DIAGNOSIS — Z89512 Acquired absence of left leg below knee: Secondary | ICD-10-CM | POA: Diagnosis not present

## 2017-03-08 DIAGNOSIS — Z720 Tobacco use: Secondary | ICD-10-CM | POA: Diagnosis not present

## 2017-03-08 DIAGNOSIS — Z992 Dependence on renal dialysis: Secondary | ICD-10-CM | POA: Diagnosis not present

## 2017-03-08 DIAGNOSIS — N2581 Secondary hyperparathyroidism of renal origin: Secondary | ICD-10-CM | POA: Diagnosis present

## 2017-03-08 DIAGNOSIS — Z89412 Acquired absence of left great toe: Secondary | ICD-10-CM | POA: Diagnosis not present

## 2017-03-08 DIAGNOSIS — I10 Essential (primary) hypertension: Secondary | ICD-10-CM | POA: Diagnosis not present

## 2017-03-08 LAB — I-STAT CG4 LACTIC ACID, ED: LACTIC ACID, VENOUS: 1.5 mmol/L (ref 0.5–1.9)

## 2017-03-08 LAB — CREATININE, SERUM
Creatinine, Ser: 10.96 mg/dL — ABNORMAL HIGH (ref 0.61–1.24)
GFR calc non Af Amer: 5 mL/min — ABNORMAL LOW (ref >60)
GFR, EST AFRICAN AMERICAN: 6 mL/min — AB (ref >60)

## 2017-03-08 LAB — GLUCOSE, CAPILLARY
GLUCOSE-CAPILLARY: 108 mg/dL — AB (ref 65–99)
Glucose-Capillary: 200 mg/dL — ABNORMAL HIGH (ref 65–99)

## 2017-03-08 LAB — CBC
HEMATOCRIT: 32.3 % — AB (ref 39.0–52.0)
Hemoglobin: 10.1 g/dL — ABNORMAL LOW (ref 13.0–17.0)
MCH: 23.4 pg — AB (ref 26.0–34.0)
MCHC: 31.3 g/dL (ref 30.0–36.0)
MCV: 74.9 fL — AB (ref 78.0–100.0)
Platelets: 212 10*3/uL (ref 150–400)
RBC: 4.31 MIL/uL (ref 4.22–5.81)
RDW: 22.3 % — ABNORMAL HIGH (ref 11.5–15.5)
WBC: 5.4 10*3/uL (ref 4.0–10.5)

## 2017-03-08 LAB — MRSA PCR SCREENING: MRSA by PCR: NEGATIVE

## 2017-03-08 LAB — CBG MONITORING, ED: Glucose-Capillary: 116 mg/dL — ABNORMAL HIGH (ref 65–99)

## 2017-03-08 MED ORDER — RENA-VITE PO TABS
1.0000 | ORAL_TABLET | Freq: Every day | ORAL | Status: DC
  Administered 2017-03-08 – 2017-03-16 (×9): 1 via ORAL
  Filled 2017-03-08 (×10): qty 1

## 2017-03-08 MED ORDER — NICOTINE 21 MG/24HR TD PT24
21.0000 mg | MEDICATED_PATCH | Freq: Every day | TRANSDERMAL | Status: DC
  Administered 2017-03-11 – 2017-03-12 (×2): 21 mg via TRANSDERMAL
  Filled 2017-03-08 (×7): qty 1

## 2017-03-08 MED ORDER — DIPHENHYDRAMINE HCL 25 MG PO CAPS
25.0000 mg | ORAL_CAPSULE | Freq: Four times a day (QID) | ORAL | Status: DC | PRN
  Administered 2017-03-08: 25 mg via ORAL
  Filled 2017-03-08: qty 1

## 2017-03-08 MED ORDER — CALCIUM ACETATE (PHOS BINDER) 667 MG PO CAPS
1334.0000 mg | ORAL_CAPSULE | Freq: Three times a day (TID) | ORAL | Status: DC
  Administered 2017-03-09 – 2017-03-10 (×4): 1334 mg via ORAL
  Filled 2017-03-08 (×11): qty 2

## 2017-03-08 MED ORDER — ONDANSETRON HCL 4 MG/2ML IJ SOLN
4.0000 mg | Freq: Three times a day (TID) | INTRAMUSCULAR | Status: DC | PRN
  Administered 2017-03-12: 4 mg via INTRAVENOUS
  Filled 2017-03-08: qty 2

## 2017-03-08 MED ORDER — OXYCODONE-ACETAMINOPHEN 5-325 MG PO TABS
2.0000 | ORAL_TABLET | ORAL | Status: DC | PRN
  Administered 2017-03-08 – 2017-03-12 (×10): 2 via ORAL
  Filled 2017-03-08 (×9): qty 2

## 2017-03-08 MED ORDER — ACETAMINOPHEN 325 MG PO TABS
650.0000 mg | ORAL_TABLET | Freq: Four times a day (QID) | ORAL | Status: DC | PRN
  Administered 2017-03-10: 650 mg via ORAL
  Filled 2017-03-08: qty 2

## 2017-03-08 MED ORDER — ZOLPIDEM TARTRATE 5 MG PO TABS
5.0000 mg | ORAL_TABLET | Freq: Every evening | ORAL | Status: DC | PRN
  Administered 2017-03-15 – 2017-03-16 (×2): 5 mg via ORAL
  Filled 2017-03-08 (×2): qty 1

## 2017-03-08 MED ORDER — VANCOMYCIN HCL IN DEXTROSE 1-5 GM/200ML-% IV SOLN: 1000.0000 mg | Freq: Once | INTRAVENOUS | Status: DC

## 2017-03-08 MED ORDER — VANCOMYCIN HCL IN DEXTROSE 1-5 GM/200ML-% IV SOLN
1000.0000 mg | INTRAVENOUS | Status: DC
  Administered 2017-03-09 – 2017-03-11 (×2): 1000 mg via INTRAVENOUS
  Filled 2017-03-08 (×2): qty 200

## 2017-03-08 MED ORDER — HYDROMORPHONE HCL 1 MG/ML IJ SOLN
0.5000 mg | INTRAMUSCULAR | Status: DC | PRN
  Administered 2017-03-08 – 2017-03-11 (×13): 0.5 mg via INTRAVENOUS
  Filled 2017-03-08 (×10): qty 0.5

## 2017-03-08 MED ORDER — HEPARIN SODIUM (PORCINE) 5000 UNIT/ML IJ SOLN
5000.0000 [IU] | Freq: Three times a day (TID) | INTRAMUSCULAR | Status: DC
  Administered 2017-03-10 – 2017-03-16 (×13): 5000 [IU] via SUBCUTANEOUS
  Filled 2017-03-08 (×20): qty 1

## 2017-03-08 MED ORDER — CALCITRIOL 0.25 MCG PO CAPS
2.2500 ug | ORAL_CAPSULE | ORAL | Status: DC
  Administered 2017-03-09 – 2017-03-16 (×3): 2.25 ug via ORAL
  Filled 2017-03-08 (×2): qty 1

## 2017-03-08 MED ORDER — VANCOMYCIN HCL 10 G IV SOLR
2000.0000 mg | Freq: Once | INTRAVENOUS | Status: AC
  Administered 2017-03-08: 2000 mg via INTRAVENOUS
  Filled 2017-03-08: qty 2000

## 2017-03-08 MED ORDER — PIPERACILLIN-TAZOBACTAM 3.375 G IVPB
3.3750 g | Freq: Two times a day (BID) | INTRAVENOUS | Status: AC
  Administered 2017-03-08 – 2017-03-13 (×10): 3.375 g via INTRAVENOUS
  Filled 2017-03-08 (×12): qty 50

## 2017-03-08 MED ORDER — AMLODIPINE BESYLATE 10 MG PO TABS
10.0000 mg | ORAL_TABLET | Freq: Every day | ORAL | Status: DC
  Administered 2017-03-08 – 2017-03-16 (×10): 10 mg via ORAL
  Filled 2017-03-08 (×4): qty 1
  Filled 2017-03-08: qty 2
  Filled 2017-03-08 (×5): qty 1

## 2017-03-08 MED ORDER — INSULIN GLARGINE 100 UNIT/ML ~~LOC~~ SOLN
7.0000 [IU] | Freq: Every day | SUBCUTANEOUS | Status: DC
  Administered 2017-03-09 – 2017-03-16 (×9): 7 [IU] via SUBCUTANEOUS
  Filled 2017-03-08 (×12): qty 0.07

## 2017-03-08 MED ORDER — HYDRALAZINE HCL 20 MG/ML IJ SOLN
5.0000 mg | INTRAMUSCULAR | Status: DC | PRN
  Administered 2017-03-09: 5 mg via INTRAVENOUS
  Filled 2017-03-08: qty 1

## 2017-03-08 MED ORDER — CLOPIDOGREL BISULFATE 75 MG PO TABS: 75.0000 mg | ORAL_TABLET | Freq: Every day | ORAL | Status: DC

## 2017-03-08 MED ORDER — LANTHANUM CARBONATE 500 MG PO CHEW
2000.0000 mg | CHEWABLE_TABLET | Freq: Three times a day (TID) | ORAL | Status: DC
  Administered 2017-03-09 – 2017-03-10 (×3): 2000 mg via ORAL
  Administered 2017-03-12: 1000 mg via ORAL
  Administered 2017-03-13 – 2017-03-14 (×2): 2000 mg via ORAL
  Administered 2017-03-14: 1000 mg via ORAL
  Administered 2017-03-15 – 2017-03-17 (×4): 2000 mg via ORAL
  Filled 2017-03-08 (×18): qty 4

## 2017-03-08 MED ORDER — INSULIN ASPART 100 UNIT/ML ~~LOC~~ SOLN
0.0000 [IU] | Freq: Three times a day (TID) | SUBCUTANEOUS | Status: DC
  Administered 2017-03-10: 3 [IU] via SUBCUTANEOUS
  Administered 2017-03-12 – 2017-03-16 (×9): 2 [IU] via SUBCUTANEOUS

## 2017-03-08 MED ORDER — PIPERACILLIN-TAZOBACTAM 3.375 G IVPB 30 MIN: 3.3750 g | Freq: Once | INTRAVENOUS | Status: DC

## 2017-03-08 NOTE — ED Notes (Signed)
Pt refusing CBG check at this time.

## 2017-03-08 NOTE — ED Notes (Signed)
MD Merrell at the bedside  

## 2017-03-08 NOTE — Progress Notes (Addendum)
This is a no charge note  Pending admission per Dr.   Nicki Guadalajara from [facility] per PA, [place]   41 year old male with past medical history of hypertension, diabetes mellitus, combined systolic and diastolic CHF, PVD, CAD, tobacco abuse, ESRD-HD, who presents with bilateral foot infection. Pt is s/p of amputation of all toes in left foot and great toe in right toe by [name].  He has an upon wound in left foot and dry gangrene in right foot. X-ray showed possible osteomyelitis in left foot.   Patient was found to have WBC 5.9, lactic acid 1.96, temperature normal, no tachycardia, O2 sat 99% on room air. Patient is admitted to telemetry bed as inpatient. IV vancomycin and Zosyn was started. Hold plavix now.  Please call Ortho in AM. Keep pt NPO after MN.  Ivor Costa, MD  [facility] Pager [phone]  If 7PM-7AM, please contact night-coverage [email or web address] Password [facility] 03/08/2017, 12:18 AM

## 2017-03-08 NOTE — H&P (Signed)
History and Physical    [name] JGG:[id number] DOB: September 04, 1975 DOA: 03/07/2017   PCP: Mauricia Area, MD   Patient coming from:  Home    Chief Complaint: Foot infection   HPI: [name] Vences Sr. is a 41 y.o. male with medical history significant for ESRD on HD, last performed on 03/07/2017, hypertension, diabetes, combined systolic and diastolic CHF, PVD, CAD, tobacco abuse, and status post amputation of all toes on L foot and , R and great toe on 3/23 with stump revision on 5/29  by [name], presenting with bilateral foot pain and drainage with foul smelling odor, at the amputation site. He reported to ashy discoloration for about one week. Denies any fever chills nausea or vomiting or shortness of breath. He denies any chest pain. Of note, the patient had been on doxycycline for one month, which he felt he was not helping, despite being compliant to the med.    ED Course:  BP (!) 144/80   Pulse 86   Temp 98.1 F (36.7 C) (Oral)   Resp 17   Ht 6\' 2"  (1.88 m)   Wt 84 kg (185 lb 3 oz)   SpO2 97%   BMI 23.78 kg/m    white count 5.9 lactic acid 1.96 started IV vancomycin and Zosyn Spoke with Ortho, [name], who is to see the patient today  x-ray of the food shows possible osteomyelitis on the left He is NPO , and PLavix is on hold anticipating surgery     Review of Systems:  As per HPI otherwise all other systems reviewed and are negative  Past Medical History:  Diagnosis Date  . Atherosclerosis of lower extremity ([place])   . CAD (coronary artery disease)   . Chronic combined systolic and diastolic heart failure ([place])   . Complication of anesthesia   . Diabetes mellitus    Type II  . Heart murmur   . History of blood transfusion   . Hypertension   . PONV (postoperative nausea and vomiting)   . Renal disorder dialysis   [name]    Past Surgical History:  Procedure Laterality Date  . ABDOMINAL AORTOGRAM N/A 11/02/2016   Procedure:  Abdominal Aortogram;  Surgeon: Waynetta Sandy, MD;  Location: [place] [facility];  Service: Cardiovascular;  Laterality: N/A;  . AMPUTATION Left 09/27/2013   Procedure: LEFT GREAT TOE AMPUTATION;  Surgeon: [name], MD;  Location: [place];  Service: Orthopedics;  Laterality: Left;  . AMPUTATION Right 08/15/2015   Procedure: Right Great Toe Amputation;  Surgeon: [name], MD;  Location: [place];  Service: Orthopedics;  Laterality: Right;  . AMPUTATION Left 11/05/2016   Procedure: TRANSMETATARSAL AMPUTATION LEFT FOOT;  Surgeon: [name], MD;  Location: [place];  Service: Orthopedics;  Laterality: Left;  . AV FISTULA PLACEMENT  left arm  . LEFT HEART CATHETERIZATION WITH CORONARY ANGIOGRAM N/A 09/13/2014   Procedure: LEFT HEART CATHETERIZATION WITH CORONARY ANGIOGRAM;  Surgeon: Sinclair Grooms, MD;  Location: [facility];  Service: Cardiovascular;  Laterality: N/A;  . LOWER EXTREMITY ANGIOGRAPHY Bilateral 11/02/2016   Procedure: Lower Extremity Angiography;  Surgeon: Waynetta Sandy, MD;  Location: [place] [facility];  Service: Cardiovascular;  Laterality: Bilateral;  . PERIPHERAL VASCULAR ATHERECTOMY Left 11/02/2016   Procedure: Peripheral Vascular Atherectomy;  Surgeon: Waynetta Sandy, MD;  Location: [place] [facility];  Service: Cardiovascular;  Laterality: Left;  PERONEAL  . STUMP REVISION Left 01/11/2017  Procedure: Revision Left Transmetatarsal Amputation;  Surgeon: [name], MD;  Location: [place];  Service: Orthopedics;  Laterality: Left;    Social History Social History   Social History  . Marital status: Legally Separated    Spouse name: N/A  . Number of children: N/A  . Years of education: N/A   Occupational History  . Not on file.   Social History Main Topics  . Smoking status: Current Every Day Smoker    Packs/day: 0.50    Years: 20.00    Types: Cigarettes    Last attempt to quit: 09/27/2014  . Smokeless tobacco: Never Used  .  Alcohol use No  . Drug use: Yes    Types: Marijuana     Comment: last time 10/28/2016  . Sexual activity: Not on file   Other Topics Concern  . Not on file   Social History Narrative  . No narrative on file     Allergies  Allergen Reactions  . Coconut Oil Anaphylaxis    Can use topically, allergic to coconut foods     Family History  Problem Relation Age of Onset  . Heart failure Mother   . Hypertension Mother       Prior to Admission medications   Medication Sig Start Date End Date Taking? Authorizing Provider  acetaminophen (TYLENOL) 500 MG tablet Take 1,000 mg by mouth every 6 (six) hours as needed for mild pain.   Yes [provider]  amLODipine (NORVASC) 10 MG tablet Take 1 tablet (10 mg total) by mouth at bedtime. 08/16/15  Yes Short, Noah Delaine, MD  calcium acetate (PHOSLO) 667 MG capsule Take 1 capsule (667 mg total) by mouth 3 (three) times daily with meals. Patient taking differently: Take 1,334 mg by mouth 3 (three) times daily with meals.  08/16/15  Yes Short, Noah Delaine, MD  clopidogrel (PLAVIX) 75 MG tablet Take 1 tablet (75 mg total) by mouth daily. 11/02/16  Yes Waynetta Sandy, MD  doxycycline (VIBRAMYCIN) 100 MG capsule Take 1 capsule (100 mg total) by mouth 2 (two) times daily. 02/11/17  Yes Hedges, Dellis Filbert, PA-C  glucagon (GLUCAGON EMERGENCY) 1 MG injection Inject 1 mg into the vein once as needed (low blood sugar and inability to eat or drink sugar). 08/16/15  Yes Short, Noah Delaine, MD  insulin glargine (LANTUS) 100 UNIT/ML injection Inject 0.1 mLs (10 Units total) into the skin at bedtime. Patient taking differently: Inject 5 Units into the skin at bedtime.  09/15/14  Yes Francesca Oman, DO  lanthanum (FOSRENOL) 1000 MG chewable tablet Chew 2 tablets (2,000 mg total) by mouth 3 (three) times daily with meals. Patient taking differently: Chew 1,000 mg by mouth 3 (three) times daily with meals. May take an additional 1000 - 2000 mgs with snacks  08/16/15  Yes Short, Mackenzie, MD  oxyCODONE-acetaminophen (PERCOCET/ROXICET) 5-325 MG tablet Take 2 tablets by mouth every 4 (four) hours as needed for severe pain. 02/11/17  Yes Okey Regal, PA-C    Physical Exam:  Vitals:   03/08/17 0815 03/08/17 0830 03/08/17 0900 03/08/17 0915  BP: (!) 145/100 (!) 159/51 (!) 159/88 (!) 144/80  Pulse: 87 91 86   Resp:   16 17  Temp:      TempSrc:      SpO2: 94% 96% 96% 97%  Weight:      Height:       Constitutional: NAD, calm, ill appearing  Eyes: PERRL, lids and conjunctivae normal ENMT: Mucous membranes are moist, without exudate or  lesions  Neck: normal, supple, no masses, no thyromegaly Respiratory: clear to auscultation bilaterally, no wheezing, no crackles. Normal respiratory effort  Cardiovascular: Regular rate and rhythm, no murmurs, rubs or gallops. No extremity edema. 2+ pedal pulses. No carotid bruits.  Abdomen: Soft, non tender, No hepatosplenomegaly. Bowel sounds positive.  Musculoskeletal: no clubbing / cyanosis in the upper extremity status post amputation of all toes on L foot and , R and great toe with significant odorous drainage from all these surgical sites. Tender to palpation  Skin: no jaundice, No lesions.  Neurologic: Sensation intact  Strength equal in all extremities Psychiatric:   Alert and oriented x 3. Flat affect    Labs on Admission: I have personally reviewed following labs and imaging studies  CBC:  Recent Labs Lab 03/07/17 2103  WBC 5.9  NEUTROABS 3.8  HGB 11.1*  HCT 34.0*  MCV 73.8*  PLT 322    Basic Metabolic Panel:  Recent Labs Lab 03/07/17 2103  NA 134*  K 3.5  CL 93*  CO2 23  GLUCOSE 103*  BUN 40*  CREATININE 10.64*  CALCIUM 8.6*    GFR: Estimated Creatinine Clearance: 10.7 mL/min (A) (by C-G formula based on SCr of 10.64 mg/dL (H)).  Liver Function Tests:  Recent Labs Lab 03/07/17 2103  AST 25  ALT 14*  ALKPHOS 137*  BILITOT 1.5*  PROT 7.1  ALBUMIN 3.5   No  results for input(s): LIPASE, AMYLASE in the last 168 hours. No results for input(s): AMMONIA in the last 168 hours.  Coagulation Profile: No results for input(s): INR, PROTIME in the last 168 hours.  Cardiac Enzymes: No results for input(s): CKTOTAL, CKMB, CKMBINDEX, TROPONINI in the last 168 hours.  BNP (last 3 results) No results for input(s): PROBNP in the last 8760 hours.  HbA1C: No results for input(s): HGBA1C in the last 72 hours.  CBG:  Recent Labs Lab 03/08/17 0821  GLUCAP 116*    Lipid Profile: No results for input(s): CHOL, HDL, LDLCALC, TRIG, CHOLHDL, LDLDIRECT in the last 72 hours.  Thyroid Function Tests: No results for input(s): TSH, T4TOTAL, FREET4, T3FREE, THYROIDAB in the last 72 hours.  Anemia Panel: No results for input(s): VITAMINB12, FOLATE, FERRITIN, TIBC, IRON, RETICCTPCT in the last 72 hours.  Urine analysis:    Component Value Date/Time   COLORURINE RED BIOCHEMICALS MAY BE AFFECTED BY COLOR (A) 01/29/2010 0939   APPEARANCEUR CLOUDY (A) 01/29/2010 0939   LABSPEC 1.020 01/29/2010 0939   PHURINE 8.0 01/29/2010 0939   GLUCOSEU NEGATIVE 01/29/2010 0939   HGBUR LARGE (A) 01/29/2010 0939   BILIRUBINUR MODERATE (A) 01/29/2010 0939   KETONESUR 15 (A) 01/29/2010 0939   PROTEINUR >300 (A) 01/29/2010 0939   UROBILINOGEN 0.2 01/29/2010 0939   NITRITE POSITIVE (A) 01/29/2010 0939   LEUKOCYTESUR LARGE (A) 01/29/2010 0939    Sepsis Labs: @LABRCNTIP (procalcitonin:4,lacticidven:4) )No results found for this or any previous visit (from the past 240 hour(s)).   Radiological Exams on Admission: Dg Foot Complete Left  Result Date: 03/07/2017 CLINICAL DATA:  Diabetic foot ulcer EXAM: LEFT FOOT - COMPLETE 3+ VIEW COMPARISON:  09/11/2016 FINDINGS: There is been interval transmetatarsal amputation performed. Vascular calcifications are seen. A large soft tissue wound is noted along the stump. Some lucency is noted within the residual first and second  metatarsals suggestive of osteomyelitis. No other focal abnormality is seen. IMPRESSION: Large soft tissue wound with changes of erosion in the first and second metatarsal remnants suggestive of osteomyelitis. Electronically Signed   By: Elta Guadeloupe  Lukens M.D.   On: 03/07/2017 22:46   Dg Foot Complete Right  Result Date: 03/07/2017 CLINICAL DATA:  Diabetic foot ulcer with second toe pain, initial encounter EXAM: RIGHT FOOT COMPLETE - 3+ VIEW COMPARISON:  02/11/2017 FINDINGS: First toe amputation is again identified. Diffuse vascular calcifications are seen. The diabetic foot wound is not well appreciated on this exam. No bony erosion to suggest osteomyelitis is noted. IMPRESSION: Prior surgical change.  No definitive osteomyelitis is noted. Electronically Signed   By: Inez Catalina M.D.   On: 03/07/2017 22:47    EKG: Independently reviewed.  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 He is  status post amputation of all toes on L foot and , R and great toe on 3/23 with stump revision on 5/29  by [name], presenting with bilateral foot pain and drainage with foul smelling odor. Afebrile white count 5.9 lactic acid 1.96 started IV vancomycin and Zosyn Ortho consult pending Telemetry inpatient  Appreciate Orthopedics, Dr. Jess Barters consultation NPO after midnight  Continue IV Vanc and Zosyn  IVF Pain control  Blood cultures   Chronic combined systolic and diastolic CHF   Last 2 D echo EF 50-55 Gr2 DD and normal syst function . Appears compensated. Weight 185 lbs  Continue meds  monitor I/Os and daily weights prn 02   Hypertension BP 159/51   Pulse 91  Continue home  anti-hypertensive medications    ESRD on HD MWF , last on 7/23  Cr 10.64  Baseline.5  Will notify Nephrology, [name] Renal Diet. Other plans as per Nephrology Check CMET in am   Type II Diabetes Current blood sugar level is 103  Lab Results  Component Value Date   HGBA1C 6.1 (H) 08/15/2015  Hgb A1C SSI and Lantus   Anemia of chronic disease Hemoglobin 11  on admission at baseline  Repeat CBC in am  No transfusion is indicated at this time    Tobacco abuse with nicotine withdrawal Continue Nicotine patch    Counseled cessation   DVT prophylaxis: Heparin  Code Status:   Full      Family Communication:  Discussed with patient Disposition Plan: Expect patient to be discharged to home after condition improves Consults called:    Ortho  Admission status:Tele  Inpatient   [facility], PA-C [facility]   03/08/2017, 9:31 AM

## 2017-03-08 NOTE — Progress Notes (Addendum)
Pharmacy Antibiotic Note  [name] Sr. is a 41 y.o. male admitted on 03/07/2017 with bilateral diabetic foot wounds, possible osteomyelitis on the left.  Pharmacy has been consulted for Vancomycin/Zosyn dosing. Pt has ESRD on HD MWF. Has been on PO doxycycline with little to no improvement. WBC WNL.   Plan: Vancomycin 2000 mg IV x 1, then 1000 mg IV qHD MWF Zosyn 3.375G IV q12h to be infused over 4 hours Trend WBC, temp, HD schedule F/U infectious work-up Drug levels as indicated   Height: 6\' 2"  (188 cm) Weight: 185 lb 3 oz (84 kg) IBW/kg (Calculated) : 82.2  Temp (24hrs), Avg:97.7 F (36.5 C), Min:97.7 F (36.5 C), Max:97.7 F (36.5 C)   Recent Labs Lab 03/07/17 2048 03/07/17 2103  WBC  --  5.9  CREATININE  --  10.64*  LATICACIDVEN 1.96*  --     Estimated Creatinine Clearance: 10.7 mL/min (A) (by C-G formula based on SCr of 10.64 mg/dL (H)).    Allergies  Allergen Reactions  . Coconut Oil Anaphylaxis    Can use topically, allergic to coconut foods     Narda Bonds 03/08/2017 1:01 AM

## 2017-03-08 NOTE — Progress Notes (Signed)
Admission note:  Arrival Method: via bed Mental Orientation: Alert and oriented. Telemetry: box 01, CMD notified. Assessment: see doc flow sheet. Skin: warm and dry. IV: R FA NSL. Pain: 10/10, PRN Dilaudid 0.5mg  given. Safety Measures: discussed and reviewed with pt, verbalized understanding. Fall Prevention Safety Plan: Call bell and belongings within reach. Admission Screening: in progress 6700 Orientation: Patient has been oriented to the unit, staff and to the room.

## 2017-03-08 NOTE — Consult Note (Signed)
[facility] Renal Consultation Note    Indication for Consultation:  Management of ESRD/hemodialysis; anemia, hypertension/volume and secondary hyperparathyroidism PCP:  HPI: [name] Labella Sr. is a 41 y.o. male with ESRD on HD MWF at [facility]. PMH includes DM, HTN, CAD, PAD s/p right great toe amputation 07/2015 and  left transmetatarsal amputation 10/2016, with dehiscence and revision 12/2016 per [name]. Has had poor wound healing with foul odor and drainage noted for last week. Says he has been on PO doxycyline. Has been using crutches to ambulate. Last evening took a step and heard "something pop" and was concerned about fracture to foot so presented to ED.   No fractures on xray. Left foot xray with soft tissue wound and erosion concerning for osteomyelitis. Empiric antibiotics started and blood cultures drawn. Ortho consult pending. Labs showing K 3.5 Lactic acid 1.5 Hgb 11.1 WBC 5.9. Seen in ED endorses pain in both feet. Feels frustrated by complications. Denies HA, fever, chills, chest pain, SOB, abdominal pain, N,V.   Dialyzes at [facility] MWF. Last HD was 7/23 and completed full treatment, but missed treatment prior to that. Frequently shortens and misses treatments.   Past Medical History:  Diagnosis Date  . Atherosclerosis of lower extremity ([place])   . CAD (coronary artery disease)   . Chronic combined systolic and diastolic heart failure ([place])   . Complication of anesthesia   . Diabetes mellitus    Type II  . Heart murmur   . History of blood transfusion   . Hypertension   . PONV (postoperative nausea and vomiting)   . Renal disorder dialysis   [name]   Past Surgical History:  Procedure Laterality Date  . ABDOMINAL AORTOGRAM N/A 11/02/2016   Procedure: Abdominal Aortogram;  Surgeon: Waynetta Sandy, MD;  Location: [place] CV LAB;  Service: Cardiovascular;  Laterality: N/A;  . AMPUTATION Left 09/27/2013   Procedure: LEFT  GREAT TOE AMPUTATION;  Surgeon: [name], MD;  Location: [place];  Service: Orthopedics;  Laterality: Left;  . AMPUTATION Right 08/15/2015   Procedure: Right Great Toe Amputation;  Surgeon: [name], MD;  Location: [place];  Service: Orthopedics;  Laterality: Right;  . AMPUTATION Left 11/05/2016   Procedure: TRANSMETATARSAL AMPUTATION LEFT FOOT;  Surgeon: [name], MD;  Location: [place];  Service: Orthopedics;  Laterality: Left;  . AV FISTULA PLACEMENT  left arm  . LEFT HEART CATHETERIZATION WITH CORONARY ANGIOGRAM N/A 09/13/2014   Procedure: LEFT HEART CATHETERIZATION WITH CORONARY ANGIOGRAM;  Surgeon: Sinclair Grooms, MD;  Location: [facility] CATH LAB;  Service: Cardiovascular;  Laterality: N/A;  . LOWER EXTREMITY ANGIOGRAPHY Bilateral 11/02/2016   Procedure: Lower Extremity Angiography;  Surgeon: Waynetta Sandy, MD;  Location: [place] CV LAB;  Service: Cardiovascular;  Laterality: Bilateral;  . PERIPHERAL VASCULAR ATHERECTOMY Left 11/02/2016   Procedure: Peripheral Vascular Atherectomy;  Surgeon: Waynetta Sandy, MD;  Location: [place] CV LAB;  Service: Cardiovascular;  Laterality: Left;  PERONEAL  . STUMP REVISION Left 01/11/2017   Procedure: Revision Left Transmetatarsal Amputation;  Surgeon: [name], MD;  Location: [place];  Service: Orthopedics;  Laterality: Left;   Family History  Problem Relation Age of Onset  . Heart failure Mother   . Hypertension Mother    Social History:  reports that he has been smoking Cigarettes.  He has a 10.00 pack-year smoking history. He has never used smokeless tobacco. He reports that he uses drugs, including Marijuana.  He reports that he does not drink alcohol. Allergies  Allergen Reactions  . Coconut Oil Anaphylaxis    Can use topically, allergic to coconut foods    Prior to Admission medications   Medication Sig Start Date End Date Taking? Authorizing Provider  acetaminophen (TYLENOL) 500 MG tablet Take 1,000 mg by  mouth every 6 (six) hours as needed for mild pain.   Yes [provider]  amLODipine (NORVASC) 10 MG tablet Take 1 tablet (10 mg total) by mouth at bedtime. 08/16/15  Yes Short, Noah Delaine, MD  calcium acetate (PHOSLO) 667 MG capsule Take 1 capsule (667 mg total) by mouth 3 (three) times daily with meals. Patient taking differently: Take 1,334 mg by mouth 3 (three) times daily with meals.  08/16/15  Yes Short, Noah Delaine, MD  clopidogrel (PLAVIX) 75 MG tablet Take 1 tablet (75 mg total) by mouth daily. 11/02/16  Yes Waynetta Sandy, MD  doxycycline (VIBRAMYCIN) 100 MG capsule Take 1 capsule (100 mg total) by mouth 2 (two) times daily. 02/11/17  Yes Hedges, Dellis Filbert, PA-C  glucagon (GLUCAGON EMERGENCY) 1 MG injection Inject 1 mg into the vein once as needed (low blood sugar and inability to eat or drink sugar). 08/16/15  Yes Short, Noah Delaine, MD  insulin glargine (LANTUS) 100 UNIT/ML injection Inject 0.1 mLs (10 Units total) into the skin at bedtime. Patient taking differently: Inject 5 Units into the skin at bedtime.  09/15/14  Yes Francesca Oman, DO  lanthanum (FOSRENOL) 1000 MG chewable tablet Chew 2 tablets (2,000 mg total) by mouth 3 (three) times daily with meals. Patient taking differently: Chew 1,000 mg by mouth 3 (three) times daily with meals. May take an additional 1000 - 2000 mgs with snacks 08/16/15  Yes Short, Mackenzie, MD  oxyCODONE-acetaminophen (PERCOCET/ROXICET) 5-325 MG tablet Take 2 tablets by mouth every 4 (four) hours as needed for severe pain. 02/11/17  Yes Hedges, Dellis Filbert, PA-C   Current Facility-Administered Medications  Medication Dose Route Frequency Provider Last Rate Last Dose  . 0.9 %  sodium chloride infusion   Intravenous Continuous Ivor Costa, MD 50 mL/hr at 03/08/17 0536    . acetaminophen (TYLENOL) tablet 650 mg  650 mg Oral Q6H PRN Ivor Costa, MD      . amLODipine (NORVASC) tablet 10 mg  10 mg Oral QHS Ivor Costa, MD   10 mg at 03/08/17 0534  .  calcium acetate (PHOSLO) capsule 1,334 mg  1,334 mg Oral TID WC Ivor Costa, MD   Stopped at 03/08/17 0831  . diphenhydrAMINE (BENADRYL) capsule 25 mg  25 mg Oral Q6H PRN Ivor Costa, MD   25 mg at 03/08/17 0501  . heparin injection 5,000 Units  5,000 Units Subcutaneous Q8H Wertman, Sara E, PA-C      . hydrALAZINE (APRESOLINE) injection 5 mg  5 mg Intravenous Q2H PRN Ivor Costa, MD      . HYDROmorphone (DILAUDID) injection 0.5 mg  0.5 mg Intravenous Q4H PRN Shawn Route, Sara E, PA-C      . insulin aspart (novoLOG) injection 0-9 Units  0-9 Units Subcutaneous TID WC Ivor Costa, MD      . insulin glargine (LANTUS) injection 7 Units  7 Units Subcutaneous QHS Ivor Costa, MD      . lanthanum [facility]) chewable tablet 2,000 mg  2,000 mg Oral TID WC Ivor Costa, MD   Stopped at 03/08/17 [phone]  . nicotine (NICODERM CQ - dosed in mg/24 hours) patch 21 mg  21 mg Transdermal Daily Ivor Costa, MD      .  ondansetron (ZOFRAN) injection 4 mg  4 mg Intravenous Q8H PRN Ivor Costa, MD      . oxyCODONE-acetaminophen (PERCOCET/ROXICET) 5-325 MG per tablet 2 tablet  2 tablet Oral Q4H PRN Ivor Costa, MD   2 tablet at 03/08/17 1156  . piperacillin-tazobactam (ZOSYN) IVPB 3.375 g  3.375 g Intravenous Q12H Erenest Blank, [facility]   Stopped at 03/08/17 6010  . [START ON 03/09/2017] vancomycin (VANCOCIN) IVPB 1000 mg/200 mL premix  1,000 mg Intravenous Q M,W,F-HD Erenest Blank, RPH      . zolpidem (AMBIEN) tablet 5 mg  5 mg Oral QHS PRN Ivor Costa, MD       Current Outpatient Prescriptions  Medication Sig Dispense Refill  . acetaminophen (TYLENOL) 500 MG tablet Take 1,000 mg by mouth every 6 (six) hours as needed for mild pain.    Marland Kitchen amLODipine (NORVASC) 10 MG tablet Take 1 tablet (10 mg total) by mouth at bedtime. 30 tablet 0  . calcium acetate (PHOSLO) 667 MG capsule Take 1 capsule (667 mg total) by mouth 3 (three) times daily with meals. (Patient taking differently: Take 1,334 mg by mouth 3 (three) times daily with meals. ) 90  capsule 0  . clopidogrel (PLAVIX) 75 MG tablet Take 1 tablet (75 mg total) by mouth daily. 30 tablet 3  . doxycycline (VIBRAMYCIN) 100 MG capsule Take 1 capsule (100 mg total) by mouth 2 (two) times daily. 20 capsule 0  . glucagon (GLUCAGON EMERGENCY) 1 MG injection Inject 1 mg into the vein once as needed (low blood sugar and inability to eat or drink sugar). 1 each 0  . insulin glargine (LANTUS) 100 UNIT/ML injection Inject 0.1 mLs (10 Units total) into the skin at bedtime. (Patient taking differently: Inject 5 Units into the skin at bedtime. ) 10 mL 12  . lanthanum (FOSRENOL) 1000 MG chewable tablet Chew 2 tablets (2,000 mg total) by mouth 3 (three) times daily with meals. (Patient taking differently: Chew 1,000 mg by mouth 3 (three) times daily with meals. May take an additional 1000 - 2000 mgs with snacks) 180 tablet 0  . oxyCODONE-acetaminophen (PERCOCET/ROXICET) 5-325 MG tablet Take 2 tablets by mouth every 4 (four) hours as needed for severe pain. 6 tablet 0    ROS: As per HPI otherwise negative.  Physical Exam: Vitals:   03/08/17 1045 03/08/17 1145 03/08/17 1200 03/08/17 1245  BP: (!) 141/77 (!) 152/98 (!) 165/70 (!) 168/93  Pulse: 80 83 85 80  Resp: 16 18 18 20   Temp:      TempSrc:      SpO2: 99%  98% 97%  Weight:      Height:         General: WDWN AAM NAD  Head: NCAT sclera not icteric MMM Neck: Supple. No JVD No masses Lungs: CTA bilaterally without wheezes, rales, or rhonchi. Breathing is unlabored. Heart: RRR with S1 S2 Abdomen: soft NT + BS Lower extremities: L foot with open metatarsal wound +purulence +odor; R toe amp sites +purulent drainage  Neuro: A & O  X 3. Moves all extremities spontaneously. Psych:  Responds to questions appropriately with a normal affect. Dialysis Access: LUE AVF +bruit    Labs: Basic Metabolic Panel:  Recent Labs Lab 03/07/17 2103 03/08/17 0958  NA 134*  --   K 3.5  --   CL 93*  --   CO2 23  --   GLUCOSE 103*  --   BUN 40*  --    CREATININE 10.64* 10.96*  CALCIUM 8.6*  --    Liver Function Tests:  Recent Labs Lab 03/07/17 2103  AST 25  ALT 14*  ALKPHOS 137*  BILITOT 1.5*  PROT 7.1  ALBUMIN 3.5   No results for input(s): LIPASE, AMYLASE in the last 168 hours. No results for input(s): AMMONIA in the last 168 hours. CBC:  Recent Labs Lab 03/07/17 2103 03/08/17 0958  WBC 5.9 5.4  NEUTROABS 3.8  --   HGB 11.1* 10.1*  HCT 34.0* 32.3*  MCV 73.8* 74.9*  PLT 233 212   Cardiac Enzymes: No results for input(s): CKTOTAL, CKMB, CKMBINDEX, TROPONINI in the last 168 hours. CBG:  Recent Labs Lab 03/08/17 0821  GLUCAP 116*   Iron Studies: No results for input(s): IRON, TIBC, TRANSFERRIN, FERRITIN in the last 72 hours. Studies/Results: Dg Foot Complete Left  Result Date: 03/07/2017 CLINICAL DATA:  Diabetic foot ulcer EXAM: LEFT FOOT - COMPLETE 3+ VIEW COMPARISON:  09/11/2016 FINDINGS: There is been interval transmetatarsal amputation performed. Vascular calcifications are seen. A large soft tissue wound is noted along the stump. Some lucency is noted within the residual first and second metatarsals suggestive of osteomyelitis. No other focal abnormality is seen. IMPRESSION: Large soft tissue wound with changes of erosion in the first and second metatarsal remnants suggestive of osteomyelitis. Electronically Signed   By: Inez Catalina M.D.   On: 03/07/2017 22:46   Dg Foot Complete Right  Result Date: 03/07/2017 CLINICAL DATA:  Diabetic foot ulcer with second toe pain, initial encounter EXAM: RIGHT FOOT COMPLETE - 3+ VIEW COMPARISON:  02/11/2017 FINDINGS: First toe amputation is again identified. Diffuse vascular calcifications are seen. The diabetic foot wound is not well appreciated on this exam. No bony erosion to suggest osteomyelitis is noted. IMPRESSION: Prior surgical change.  No definitive osteomyelitis is noted. Electronically Signed   By: Inez Catalina M.D.   On: 03/07/2017 22:47    Dialysis Orders:   [facility] MWF  4h 200 F  BFR 500/800 2K/2Ca UF Profile 2 EDW 80.5kg  L AVF Heparin bolus 8000 U Venofer 100 mg IV x10 (until 8/10) Calcitriol 2.25 mcg PO q HD BMM:  Fosrenol 1000 2 tabs ti/ Ca acetate 3 tabs tid    Assessment/Plan: 1.  Bilat foot wounds/osetomyelitis - per primary/ortho consult pending. IV Vanc/Zosyn started. Blood cultures pending 2.  ESRD -  MWF. Continue on schedule. Next HD Wed 4K bath with K 3.5  3.  Hypertension/volume  - BP elevated, cont home meds/ Shoud improve with UF tomorrow UF goal 2-3L  4.  Anemia  - Hgb 11.1 No ESA needs currently/Hold Fe with infection  5.  Metabolic bone disease -  Continue VDRA/binders Follow P with renal panel  6.  Nutrition - NPO presently Renal diet/vitamins when diet resumes  7. DM - per primary   Lynnda Child PA-C [place] Pager [phone] 03/08/2017, 2:22 PM   Pt seen, examined and agree w A/P as above.  Kelly Splinter MD [employer] pager [phone]   03/08/2017, 4:41 PM

## 2017-03-08 NOTE — ED Notes (Signed)
Pt placed in hospital bed and sheets changed.

## 2017-03-09 LAB — CBC
HCT: 32.5 % — ABNORMAL LOW (ref 39.0–52.0)
Hemoglobin: 10.4 g/dL — ABNORMAL LOW (ref 13.0–17.0)
MCH: 24.1 pg — ABNORMAL LOW (ref 26.0–34.0)
MCHC: 32 g/dL (ref 30.0–36.0)
MCV: 75.4 fL — AB (ref 78.0–100.0)
PLATELETS: 227 10*3/uL (ref 150–400)
RBC: 4.31 MIL/uL (ref 4.22–5.81)
RDW: 22.7 % — AB (ref 11.5–15.5)
WBC: 6.7 10*3/uL (ref 4.0–10.5)

## 2017-03-09 LAB — RENAL FUNCTION PANEL
Albumin: 3.2 g/dL — ABNORMAL LOW (ref 3.5–5.0)
Anion gap: 17 — ABNORMAL HIGH (ref 5–15)
BUN: 54 mg/dL — AB (ref 6–20)
CALCIUM: 8.8 mg/dL — AB (ref 8.9–10.3)
CHLORIDE: 94 mmol/L — AB (ref 101–111)
CO2: 23 mmol/L (ref 22–32)
CREATININE: 12.38 mg/dL — AB (ref 0.61–1.24)
GFR, EST AFRICAN AMERICAN: 5 mL/min — AB (ref >60)
GFR, EST NON AFRICAN AMERICAN: 4 mL/min — AB (ref >60)
Glucose, Bld: 70 mg/dL (ref 65–99)
Phosphorus: 8.2 mg/dL — ABNORMAL HIGH (ref 2.5–4.6)
Potassium: 4.2 mmol/L (ref 3.5–5.1)
SODIUM: 134 mmol/L — AB (ref 135–145)

## 2017-03-09 LAB — GLUCOSE, CAPILLARY
Glucose-Capillary: 74 mg/dL (ref 65–99)
Glucose-Capillary: 77 mg/dL (ref 65–99)
Glucose-Capillary: 104 mg/dL — ABNORMAL HIGH (ref 65–99)
Glucose-Capillary: 178 mg/dL — ABNORMAL HIGH (ref 65–99)

## 2017-03-09 MED ORDER — HEPARIN SODIUM (PORCINE) 1000 UNIT/ML DIALYSIS: 1000.0000 [IU] | INTRAMUSCULAR | Status: DC | PRN

## 2017-03-09 MED ORDER — LIDOCAINE-PRILOCAINE 2.5-2.5 % EX CREA: 1.0000 "application " | TOPICAL_CREAM | CUTANEOUS | Status: DC | PRN

## 2017-03-09 MED ORDER — HYDROMORPHONE HCL 1 MG/ML IJ SOLN
INTRAMUSCULAR | Status: AC
  Administered 2017-03-09: 0.5 mg via INTRAVENOUS
  Filled 2017-03-09: qty 0.5

## 2017-03-09 MED ORDER — CALCITRIOL 0.5 MCG PO CAPS
ORAL_CAPSULE | ORAL | Status: AC
Start: 2017-03-09 — End: 2017-03-09
  Filled 2017-03-09: qty 4

## 2017-03-09 MED ORDER — VANCOMYCIN HCL IN DEXTROSE 1-5 GM/200ML-% IV SOLN
INTRAVENOUS | Status: AC
  Administered 2017-03-09: 1000 mg via INTRAVENOUS
  Filled 2017-03-09: qty 200

## 2017-03-09 MED ORDER — WHITE PETROLATUM GEL
Status: AC
  Administered 2017-03-09: 21:00:00
  Filled 2017-03-09: qty 1

## 2017-03-09 MED ORDER — LIDOCAINE HCL (PF) 1 % IJ SOLN: 5.0000 mL | INTRAMUSCULAR | Status: DC | PRN

## 2017-03-09 MED ORDER — CALCITRIOL 0.25 MCG PO CAPS
ORAL_CAPSULE | ORAL | Status: AC
Start: 2017-03-09 — End: 2017-03-09
  Filled 2017-03-09: qty 1

## 2017-03-09 MED ORDER — PENTAFLUOROPROP-TETRAFLUOROETH EX AERO: 1.0000 "application " | INHALATION_SPRAY | CUTANEOUS | Status: DC | PRN

## 2017-03-09 MED ORDER — SODIUM CHLORIDE 0.9 % IV SOLN: 100.0000 mL | INTRAVENOUS | Status: DC | PRN

## 2017-03-09 MED ORDER — CAMPHOR-MENTHOL 0.5-0.5 % EX LOTN
TOPICAL_LOTION | CUTANEOUS | Status: DC | PRN
  Filled 2017-03-09 (×2): qty 222

## 2017-03-09 MED ORDER — HEPARIN SODIUM (PORCINE) 1000 UNIT/ML DIALYSIS: 20.0000 [IU]/kg | INTRAMUSCULAR | Status: DC | PRN

## 2017-03-09 NOTE — Consult Note (Signed)
ORTHOPAEDIC CONSULTATION  REQUESTING PHYSICIAN: Cristal Ford, DO  Chief Complaint: Dehiscence left transmetatarsal amputation with gangrene of the second toe right foot  HPI: [name] Dooling Sr. is a 41 y.o. male who presents with patient is a 41 year old gentleman with insulin-dependent diabetes end stage renal disease on dialysis who is status post transmetatarsal limb salvage intervention left status post great toe amputation on the right. Patient presents complaining of dehiscence of the transmetatarsal amputation of the left and gangrenous changes to these second toe right foot.  Past Medical History:  Diagnosis Date  . Anemia   . Atherosclerosis of lower extremity ([place])   . CAD (coronary artery disease)   . Chronic combined systolic and diastolic heart failure ([place])   . Complication of anesthesia   . ESRD (end stage renal disease) on dialysis [facility])    "MWF [employer]" (03/08/2017)  . GERD (gastroesophageal reflux disease)   . Heart murmur   . History of blood transfusion    "related to OR"  . Hypertension   . PONV (postoperative nausea and vomiting)   . Type II diabetes mellitus ([place])    Past Surgical History:  Procedure Laterality Date  . ABDOMINAL AORTOGRAM N/A 11/02/2016   Procedure: Abdominal Aortogram;  Surgeon: Waynetta Sandy, MD;  Location: [place] CV LAB;  Service: Cardiovascular;  Laterality: N/A;  . AMPUTATION Left 09/27/2013   Procedure: LEFT GREAT TOE AMPUTATION;  Surgeon: [name], MD;  Location: [place];  Service: Orthopedics;  Laterality: Left;  . AMPUTATION Right 08/15/2015   Procedure: Right Great Toe Amputation;  Surgeon: [name], MD;  Location: [place];  Service: Orthopedics;  Laterality: Right;  . AMPUTATION Left 11/05/2016   Procedure: TRANSMETATARSAL AMPUTATION LEFT FOOT;  Surgeon: [name], MD;  Location: [place];  Service: Orthopedics;  Laterality: Left;  . AV FISTULA PLACEMENT  left arm  . LEFT HEART  CATHETERIZATION WITH CORONARY ANGIOGRAM N/A 09/13/2014   Procedure: LEFT HEART CATHETERIZATION WITH CORONARY ANGIOGRAM;  Surgeon: Sinclair Grooms, MD;  Location: [facility] CATH LAB;  Service: Cardiovascular;  Laterality: N/A;  . LOWER EXTREMITY ANGIOGRAPHY Bilateral 11/02/2016   Procedure: Lower Extremity Angiography;  Surgeon: Waynetta Sandy, MD;  Location: [place] CV LAB;  Service: Cardiovascular;  Laterality: Bilateral;  . PERIPHERAL VASCULAR ATHERECTOMY Left 11/02/2016   Procedure: Peripheral Vascular Atherectomy;  Surgeon: Waynetta Sandy, MD;  Location: [place] CV LAB;  Service: Cardiovascular;  Laterality: Left;  PERONEAL  . STUMP REVISION Left 01/11/2017   Procedure: Revision Left Transmetatarsal Amputation;  Surgeon: [name], MD;  Location: [place];  Service: Orthopedics;  Laterality: Left;   Social History   Social History  . Marital status: Legally Separated    Spouse name: N/A  . Number of children: N/A  . Years of education: N/A   Social History Main Topics  . Smoking status: Current Every Day Smoker    Packs/day: 0.50    Years: 26.00    Types: Cigarettes  . Smokeless tobacco: Never Used  . Alcohol use Yes     Comment: 03/08/2017 "haven't took a drink in over 5 years"  . Drug use: Yes    Types: Marijuana     Comment: 03/08/2017 "basically q day"   . Sexual activity: Yes   Other Topics Concern  . None   Social History Narrative  . None   Family History  Problem Relation Age of Onset  . Heart failure Mother   .  Hypertension Mother    - negative except otherwise stated in the family history section Allergies  Allergen Reactions  . Coconut Oil Anaphylaxis    Can use topically, allergic to coconut foods    Prior to Admission medications   Medication Sig Start Date End Date Taking? Authorizing Provider  acetaminophen (TYLENOL) 500 MG tablet Take 1,000 mg by mouth every 6 (six) hours as needed for mild pain.   Yes [provider]    amLODipine (NORVASC) 10 MG tablet Take 1 tablet (10 mg total) by mouth at bedtime. 08/16/15  Yes Short, Noah Delaine, MD  calcium acetate (PHOSLO) 667 MG capsule Take 1 capsule (667 mg total) by mouth 3 (three) times daily with meals. Patient taking differently: Take 1,334 mg by mouth 3 (three) times daily with meals.  08/16/15  Yes Short, Noah Delaine, MD  clopidogrel (PLAVIX) 75 MG tablet Take 1 tablet (75 mg total) by mouth daily. 11/02/16  Yes Waynetta Sandy, MD  doxycycline (VIBRAMYCIN) 100 MG capsule Take 1 capsule (100 mg total) by mouth 2 (two) times daily. 02/11/17  Yes Hedges, Dellis Filbert, PA-C  glucagon (GLUCAGON EMERGENCY) 1 MG injection Inject 1 mg into the vein once as needed (low blood sugar and inability to eat or drink sugar). 08/16/15  Yes Short, Noah Delaine, MD  insulin glargine (LANTUS) 100 UNIT/ML injection Inject 0.1 mLs (10 Units total) into the skin at bedtime. Patient taking differently: Inject 5 Units into the skin at bedtime.  09/15/14  Yes Francesca Oman, DO  lanthanum (FOSRENOL) 1000 MG chewable tablet Chew 2 tablets (2,000 mg total) by mouth 3 (three) times daily with meals. Patient taking differently: Chew 1,000 mg by mouth 3 (three) times daily with meals. May take an additional 1000 - 2000 mgs with snacks 08/16/15  Yes Short, Mackenzie, MD  oxyCODONE-acetaminophen (PERCOCET/ROXICET) 5-325 MG tablet Take 2 tablets by mouth every 4 (four) hours as needed for severe pain. 02/11/17  Yes Okey Regal, PA-C   Dg Foot Complete Left  Result Date: 03/07/2017 CLINICAL DATA:  Diabetic foot ulcer EXAM: LEFT FOOT - COMPLETE 3+ VIEW COMPARISON:  09/11/2016 FINDINGS: There is been interval transmetatarsal amputation performed. Vascular calcifications are seen. A large soft tissue wound is noted along the stump. Some lucency is noted within the residual first and second metatarsals suggestive of osteomyelitis. No other focal abnormality is seen. IMPRESSION: Large soft tissue wound with  changes of erosion in the first and second metatarsal remnants suggestive of osteomyelitis. Electronically Signed   By: Inez Catalina M.D.   On: 03/07/2017 22:46   Dg Foot Complete Right  Result Date: 03/07/2017 CLINICAL DATA:  Diabetic foot ulcer with second toe pain, initial encounter EXAM: RIGHT FOOT COMPLETE - 3+ VIEW COMPARISON:  02/11/2017 FINDINGS: First toe amputation is again identified. Diffuse vascular calcifications are seen. The diabetic foot wound is not well appreciated on this exam. No bony erosion to suggest osteomyelitis is noted. IMPRESSION: Prior surgical change.  No definitive osteomyelitis is noted. Electronically Signed   By: Inez Catalina M.D.   On: 03/07/2017 22:47   - pertinent xrays, CT, MRI studies were reviewed and independently interpreted  Positive ROS: All other systems have been reviewed and were otherwise negative with the exception of those mentioned in the HPI and as above.  Physical Exam: General: Alert, no acute distress Psychiatric: Patient is competent for consent with normal mood and affect Lymphatic: No axillary or cervical lymphadenopathy Cardiovascular: No pedal edema Respiratory: No cyanosis, no use of accessory musculature  GI: No organomegaly, abdomen is soft and non-tender  Skin: Examination patient has dry gangrene of second toe right foot. He has a faintly palpable pulse. Examination the left foot has dehiscence of the transmetatarsal amputation his foot is painful to palpation there is exposed bone there is no abscess no ascending cellulitis he does have a palpable pulse.   Neurologic: Patient does not have protective sensation bilateral lower extremities.   MUSCULOSKELETAL:  Examination patient has been atrophic legs he has failed surgical intervention for transmetatarsal amputation left and has healed a great toe amputation the right and now has gangrenous changes of the second toe.  Assessment: Assessment: Diabetic insensate neuropathy  end-stage renal disease on dialysis with dehiscence of the left transmetatarsal amputation and gangrenous changes of the right second toe with a well-healed great toe amputation.    Plan: Plan: Discussed with the patient recommendation proceed with a transtibial amputation on the left. Patient states he would like to also proceed with a right second toe amputation. Discussed the increased risk of the wound not healing. Patient states he understands wishes to proceed at this time. Plan for surgery on Friday.  Thank you for the consult and the opportunity to see [name], [place] [phone] 7:17 AM

## 2017-03-09 NOTE — Consult Note (Signed)
WOC consult requested prior to ortho service involvement.  Dr Sharol Given is now following for assessment and plan of care and EMR indicates that pt will go to surgery later this week. Requested to provide topical treatment orders for bedside nurses.  Visualized photos in the chart.  Xeroform gauze to necrotic right second toe, moist fluffed gauze to full thickness wound on left stump. Please refer to ortho service for further questions. Please re-consult if further assistance is needed.  Thank-you,  Julien Girt MSN, [place], [place], [place], [place]

## 2017-03-09 NOTE — Progress Notes (Signed)
Received call from central telemetry that patient had 9 beat run vtach. Patient asymptomatic. VSS. Will continue to monitor.     03/09/17 1706  Vitals  Temp 98.4 F (36.9 C)  Temp Source Oral  BP (!) 191/94  BP Location Right Arm  BP Method Automatic  Patient Position (if appropriate) Lying  Pulse Rate 85  Pulse Rate Source Dinamap  Resp 12  Oxygen Therapy  SpO2 92 %  O2 Device Room Air  Pain Assessment  Pain Assessment 0-10  Pain Score 8  Pain Type Acute pain  Pain Location Foot  Pain Orientation Right;Left  Pain Descriptors / Indicators Sharp;Stabbing  Pain Frequency Constant  Pain Onset On-going  Pain Intervention(s) Medication (See eMAR)

## 2017-03-09 NOTE — Progress Notes (Signed)
[facility] Progress Note  Subjective:  Seen on HD UF goal 3.5L No c/os  For amputation Friday   Objective Vitals:   03/09/17 0715 03/09/17 0730 03/09/17 0745 03/09/17 0815  BP: (!) 148/93 (!) 176/103 (!) 172/92 (!) 168/97  Pulse: 83 87 89 79  Resp: 15     Temp:      TempSrc:      SpO2:      Weight:      Height:       Physical Exam General: NAD  Heart: RRR  Lungs: CTAB Abdomen: soft NT  Extremities: L foot with open metatarsal wound +purulence +odor; R toe amp sites +purulent drainage  Dialysis Access: LUE AVF    Additional Objective Labs: Basic Metabolic Panel:  Recent Labs Lab 03/07/17 2103 03/08/17 0958 03/09/17 0719  NA 134*  --  134*  K 3.5  --  4.2  CL 93*  --  94*  CO2 23  --  23  GLUCOSE 103*  --  70  BUN 40*  --  54*  CREATININE 10.64* 10.96* 12.38*  CALCIUM 8.6*  --  8.8*  PHOS  --   --  8.2*   Liver Function Tests:  Recent Labs Lab 03/07/17 2103 03/09/17 0719  AST 25  --   ALT 14*  --   ALKPHOS 137*  --   BILITOT 1.5*  --   PROT 7.1  --   ALBUMIN 3.5 3.2*   No results for input(s): LIPASE, AMYLASE in the last 168 hours. CBC:  Recent Labs Lab 03/07/17 2103 03/08/17 0958 03/09/17 0719  WBC 5.9 5.4 6.7  NEUTROABS 3.8  --   --   HGB 11.1* 10.1* 10.4*  HCT 34.0* 32.3* 32.5*  MCV 73.8* 74.9* 75.4*  PLT 233 212 227   Blood Culture    Component Value Date/Time   SDES BLOOD RIGHT HAND 08/15/2015 0000   SPECREQUEST BOTTLES DRAWN AEROBIC AND ANAEROBIC 5CC 08/15/2015 0000   CULT NO GROWTH 5 DAYS 08/15/2015 0000   REPTSTATUS 08/20/2015 FINAL 08/15/2015 0000    Cardiac Enzymes: No results for input(s): CKTOTAL, CKMB, CKMBINDEX, TROPONINI in the last 168 hours. CBG:  Recent Labs Lab 03/08/17 0821 03/08/17 1737 03/08/17 2202  GLUCAP 116* 108* 200*   Iron Studies: No results for input(s): IRON, TIBC, TRANSFERRIN, FERRITIN in the last 72 hours. Lab Results  Component Value Date   INR 1.05 09/13/2014   INR 1.05  09/27/2013   Medications: . sodium chloride 50 mL/hr at 03/08/17 1806  . sodium chloride    . sodium chloride    . piperacillin-tazobactam (ZOSYN)  IV 3.375 g (03/09/17 0527)  . vancomycin     . calcitRIOL      . calcitRIOL      . amLODipine  10 mg Oral QHS  . calcitRIOL  2.25 mcg Oral Q M,W,F-HD  . calcium acetate  1,334 mg Oral TID WC  . heparin  5,000 Units Subcutaneous Q8H  . insulin aspart  0-9 Units Subcutaneous TID WC  . insulin glargine  7 Units Subcutaneous QHS  . lanthanum  2,000 mg Oral TID WC  . multivitamin  1 tablet Oral QHS  . nicotine  21 mg Transdermal Daily   Dialysis Orders: [facility] MWF  4h 200 F  BFR 500/800 2K/2Ca UF Profile 2 EDW 80.5kg  L AVF Heparin bolus 8000 U Venofer 100 mg IV x10 (until 8/10) Calcitriol 2.25 mcg PO q HD BMM:  Fosrenol 1000 2 tabs ti/  Ca acetate 3 tabs tid   Assessment/Plan: 1.  Bilat foot wounds/osetomyelitis - per primary/ortho planning transtibial amputation Friday. On IV Vanc/Zosyn/  Blood cultures pending 2.  ESRD -  MWF. Continue on schedule. Next HD Wed 4K bath with K 3.5  3.  Hypertension/volume  - BP elevated, cont home meds/ Shoud improve with UF goal 3.5L  4.  Anemia  - Hgb 11.1 No ESA needs currently/Hold Fe with infection  5.  Metabolic bone disease -  Continue VDRA/binders Follow P with renal panel  6.  Nutrition - NPO presently Renal diet/vitamins when diet resumes  7. DM - per primary    Lynnda Child PA-C [facility] Pager [phone] 03/09/2017,8:48 AM  LOS: 1 day    Pt seen, examined and agree w A/P as above. BP's up, get volume down w/ HD today.  To OR Friday.  Kelly Splinter MD [employer] pager [phone]   03/09/2017, 12:28 PM

## 2017-03-09 NOTE — Progress Notes (Signed)
PROGRESS NOTE    [name] Nardelli Sr.  ZWC:[id number] DOB: 09-25-1975 DOA: 03/07/2017 PCP: Patient, [name] Per   Chief Complaint  Patient presents with  . Foot Pain    Brief Narrative:  HPI on 03/08/17 by Ms. [name], PA [name] Mcwhirter Sr. is a 41 y.o. male with medical history significant for ESRD on HD, last performed on 03/07/2017, hypertension, diabetes, combined systolic and diastolic CHF, PVD, CAD, tobacco abuse, and status post amputation of all toes on L foot and , R and great toe on 3/23 with stump revision on 5/29  by [name], presenting with bilateral foot pain and drainage with foul smelling odor, at the amputation site. He reported to ashy discoloration for about one week. Denies any fever chills nausea or vomiting or shortness of breath. He denies any chest pain. Of note, the patient had been on doxycycline for one month, which he felt he was not helping, despite being compliant to the med.  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 with gangrenous changes -In the setting of diabetes and PVD -Post transmetatarsal indication on the left, right great toe and patient 3/23 with a stump revision on 5/29 -Currently on IV vancomycin and Zosyn -Orthopedic surgery consultation appreciated, plan for surgical intervention on 03/11/2017 -Blood cultures shows no growth to date -Wound care consultation appreciated -Continue pain control  End-stage renal disease -Patient dialyzes MWF -Nephrology consultation appreciated  Chronic combined systolic and diastolic heart failure -Echocardiogram 09/05/2014 showed EF of 23-53%, grade 2 diastolic dysfunction -Currently appears to be euvolemic -Continue volume control with hemodialysis  Essential hypertension -Continue amlodipine and IV hydralazine as needed  Diabetes mellitus, type II -Continue Lantus, insulin sliding scale and CBG monitoring -We'll order hemoglobin  A1c  Anemia of chronic disease -Baseline hemoglobin approximately 8-10, currently 10.4 -Continue to monitor CBC  Tobacco abuse -Smoking cessation discussed -Continue nicotine patch   DVT Prophylaxis  heparin  Code Status: Full  Family Communication: None at bedside  Disposition Plan: Admitted. Pending surgery on on 7/27.   Consultants Orthopedic surgery Nephrology  Procedures  None  Antibiotics   Anti-infectives    Start     Dose/Rate Route Frequency Ordered Stop   03/09/17 1200  vancomycin (VANCOCIN) IVPB 1000 mg/200 mL premix     1,000 mg 200 mL/hr over 60 Minutes Intravenous Every M-W-F (Hemodialysis) 03/08/17 0115     03/08/17 0130  vancomycin (VANCOCIN) 2,000 mg in sodium chloride 0.9 % 500 mL IVPB     2,000 mg 250 mL/hr over 120 Minutes Intravenous  Once 03/08/17 0115 03/08/17 0343   03/08/17 0130  piperacillin-tazobactam (ZOSYN) IVPB 3.375 g     3.375 g 12.5 mL/hr over 240 Minutes Intravenous Every 12 hours 03/08/17 0115     03/08/17 0015  vancomycin (VANCOCIN) IVPB 1000 mg/200 mL premix  Status:  Discontinued     1,000 mg 200 mL/hr over 60 Minutes Intravenous  Once 03/08/17 0001 03/08/17 0111   03/08/17 0015  piperacillin-tazobactam (ZOSYN) IVPB 3.375 g  Status:  Discontinued     3.375 g 100 mL/hr over 30 Minutes Intravenous  Once 03/08/17 0001 03/08/17 0111      Subjective:   [name] [name] seen and examined today.  Seen in dialysis. Would like to eat as he is not having surgery today. Would also like his foot to be covered up. Currently denies a chest pain, shortness of breath, abdominal pain, nausea or vomiting, diarrhea or constipation,  dizziness or headache.   Objective:   Vitals:   03/09/17 1030 03/09/17 1045 03/09/17 1111 03/09/17 1152  BP: (!) 151/69 (!) 165/85 (!) 156/78 (!) 165/86  Pulse: 86 93 80 84  Resp:   14 15  Temp:   97.6 F (36.4 C) 98.1 F (36.7 C)  TempSrc:   Oral Oral  SpO2:   94% 92%  Weight:   79.2 kg (174 lb 9.7 oz)    Height:        Intake/Output Summary (Last 24 hours) at 03/09/17 1409 Last data filed at 03/09/17 1152  Gross per 24 hour  Intake                0 ml  Output             3000 ml  Net            -3000 ml   Filed Weights   03/08/17 2207 03/09/17 0710 03/09/17 1111  Weight: 86 kg (189 lb 9.5 oz) 82.4 kg (181 lb 10.5 oz) 79.2 kg (174 lb 9.7 oz)    Exam  [name]: Well developed, well nourished, NAD, appears stated age  HEENT: NCAT, mucous membranes moist.   Cardiovascular: S1 S2 auscultated, no rubs, murmurs or gallops. Regular rate and rhythm.  Respiratory: Clear to auscultation bilaterally with equal chest rise  Abdomen: Soft, nontender, nondistended, + bowel sounds  Extremities: warm dry without cyanosis clubbing. Left foot with open metatarsal wound with purulent drainage and odor. Right great toe amputation site with purulent drainage.   Neuro: AAOx3nonfocal  Psych: Appropriate   Data Reviewed: I have personally reviewed following labs and imaging studies  CBC:  Recent Labs Lab 03/07/17 2103 03/08/17 0958 03/09/17 0719  WBC 5.9 5.4 6.7  NEUTROABS 3.8  --   --   HGB 11.1* 10.1* 10.4*  HCT 34.0* 32.3* 32.5*  MCV 73.8* 74.9* 75.4*  PLT 233 212 742   Basic Metabolic Panel:  Recent Labs Lab 03/07/17 2103 03/08/17 0958 03/09/17 0719  NA 134*  --  134*  K 3.5  --  4.2  CL 93*  --  94*  CO2 23  --  23  GLUCOSE 103*  --  70  BUN 40*  --  54*  CREATININE 10.64* 10.96* 12.38*  CALCIUM 8.6*  --  8.8*  PHOS  --   --  8.2*   GFR: Estimated Creatinine Clearance: 8.9 mL/min (A) (by C-G formula based on SCr of 12.38 mg/dL (H)). Liver Function Tests:  Recent Labs Lab 03/07/17 2103 03/09/17 0719  AST 25  --   ALT 14*  --   ALKPHOS 137*  --   BILITOT 1.5*  --   PROT 7.1  --   ALBUMIN 3.5 3.2*   No results for input(s): LIPASE, AMYLASE in the last 168 hours. No results for input(s): AMMONIA in the last 168 hours. Coagulation Profile: No results for  input(s): INR, PROTIME in the last 168 hours. Cardiac Enzymes: No results for input(s): CKTOTAL, CKMB, CKMBINDEX, TROPONINI in the last 168 hours. BNP (last 3 results) No results for input(s): PROBNP in the last 8760 hours. HbA1C: No results for input(s): HGBA1C in the last 72 hours. CBG:  Recent Labs Lab 03/08/17 0821 03/08/17 1737 03/08/17 2202 03/09/17 1031 03/09/17 1149  GLUCAP 116* 108* 200* 74 77   Lipid Profile: No results for input(s): CHOL, HDL, LDLCALC, TRIG, CHOLHDL, LDLDIRECT in the last 72 hours. Thyroid Function Tests: No results for input(s): TSH,  T4TOTAL, FREET4, T3FREE, THYROIDAB in the last 72 hours. Anemia Panel: No results for input(s): VITAMINB12, FOLATE, FERRITIN, TIBC, IRON, RETICCTPCT in the last 72 hours. Urine analysis:    Component Value Date/Time   COLORURINE RED BIOCHEMICALS MAY BE AFFECTED BY COLOR (A) 01/29/2010 0939   APPEARANCEUR CLOUDY (A) 01/29/2010 0939   LABSPEC 1.020 01/29/2010 0939   PHURINE 8.0 01/29/2010 0939   GLUCOSEU NEGATIVE 01/29/2010 0939   HGBUR LARGE (A) 01/29/2010 0939   BILIRUBINUR MODERATE (A) 01/29/2010 0939   KETONESUR 15 (A) 01/29/2010 0939   PROTEINUR >300 (A) 01/29/2010 0939   UROBILINOGEN 0.2 01/29/2010 0939   NITRITE POSITIVE (A) 01/29/2010 0939   LEUKOCYTESUR LARGE (A) 01/29/2010 0939   Sepsis Labs: @LABRCNTIP (procalcitonin:4,lacticidven:4)  ) Recent Results (from the past 240 hour(s))  Blood culture (routine x 2)     Status: None (Preliminary result)   Collection Time: 03/07/17 10:58 PM  Result Value Ref Range Status   Specimen Description BLOOD RIGHT FOREARM  Final   Special Requests IN PEDIATRIC BOTTLE Blood Culture adequate volume  Final   Culture NO GROWTH 1 DAY  Final   Report Status PENDING  Incomplete  Blood culture (routine x 2)     Status: None (Preliminary result)   Collection Time: 03/07/17 11:04 PM  Result Value Ref Range Status   Specimen Description BLOOD RIGHT HAND  Final   Special  Requests   Final    BOTTLES DRAWN AEROBIC AND ANAEROBIC Blood Culture adequate volume   Culture NO GROWTH 1 DAY  Final   Report Status PENDING  Incomplete  MRSA PCR Screening     Status: None   Collection Time: 03/08/17  5:17 PM  Result Value Ref Range Status   MRSA by PCR NEGATIVE NEGATIVE Final    Comment:        The GeneXpert MRSA Assay (FDA approved for NASAL specimens only), is one component of a comprehensive MRSA colonization surveillance program. It is not intended to diagnose MRSA infection nor to guide or monitor treatment for MRSA infections.       Radiology Studies: Dg Foot Complete Left  Result Date: 03/07/2017 CLINICAL DATA:  Diabetic foot ulcer EXAM: LEFT FOOT - COMPLETE 3+ VIEW COMPARISON:  09/11/2016 FINDINGS: There is been interval transmetatarsal amputation performed. Vascular calcifications are seen. A large soft tissue wound is noted along the stump. Some lucency is noted within the residual first and second metatarsals suggestive of osteomyelitis. No other focal abnormality is seen. IMPRESSION: Large soft tissue wound with changes of erosion in the first and second metatarsal remnants suggestive of osteomyelitis. Electronically Signed   By: Inez Catalina M.D.   On: 03/07/2017 22:46   Dg Foot Complete Right  Result Date: 03/07/2017 CLINICAL DATA:  Diabetic foot ulcer with second toe pain, initial encounter EXAM: RIGHT FOOT COMPLETE - 3+ VIEW COMPARISON:  02/11/2017 FINDINGS: First toe amputation is again identified. Diffuse vascular calcifications are seen. The diabetic foot wound is not well appreciated on this exam. No bony erosion to suggest osteomyelitis is noted. IMPRESSION: Prior surgical change.  No definitive osteomyelitis is noted. Electronically Signed   By: Inez Catalina M.D.   On: 03/07/2017 22:47     Scheduled Meds: . amLODipine  10 mg Oral QHS  . calcitRIOL  2.25 mcg Oral Q M,W,F-HD  . calcium acetate  1,334 mg Oral TID WC  . heparin  5,000 Units  Subcutaneous Q8H  . insulin aspart  0-9 Units Subcutaneous TID WC  . insulin glargine  7 Units Subcutaneous QHS  . lanthanum  2,000 mg Oral TID WC  . multivitamin  1 tablet Oral QHS  . nicotine  21 mg Transdermal Daily   Continuous Infusions: . piperacillin-tazobactam (ZOSYN)  IV Stopped (03/09/17 0927)  . vancomycin Stopped (03/09/17 1105)     LOS: 1 day   Time Spent in minutes   30 minutes  Ewan Grau D.O. on 03/09/2017 at 2:09 PM  Between 7am to 7pm - Pager - [phone]  After 7pm go to [email or web address] - password [email or web address]  And look for the night coverage person covering for me after hours  [facility] Office  [phone]

## 2017-03-10 ENCOUNTER — Other Ambulatory Visit (INDEPENDENT_AMBULATORY_CARE_PROVIDER_SITE_OTHER): Payer: Self-pay | Admitting: Family

## 2017-03-10 DIAGNOSIS — I472 Ventricular tachycardia: Secondary | ICD-10-CM

## 2017-03-10 LAB — BASIC METABOLIC PANEL
Anion gap: 15 (ref 5–15)
BUN: 31 mg/dL — AB (ref 6–20)
CO2: 26 mmol/L (ref 22–32)
Calcium: 9.2 mg/dL (ref 8.9–10.3)
Chloride: 92 mmol/L — ABNORMAL LOW (ref 101–111)
Creatinine, Ser: 8.13 mg/dL — ABNORMAL HIGH (ref 0.61–1.24)
GFR, EST AFRICAN AMERICAN: 9 mL/min — AB (ref >60)
GFR, EST NON AFRICAN AMERICAN: 7 mL/min — AB (ref >60)
Glucose, Bld: 63 mg/dL — ABNORMAL LOW (ref 65–99)
POTASSIUM: 3.6 mmol/L (ref 3.5–5.1)
SODIUM: 133 mmol/L — AB (ref 135–145)

## 2017-03-10 LAB — CBC
HCT: 31.7 % — ABNORMAL LOW (ref 39.0–52.0)
HEMOGLOBIN: 10.4 g/dL — AB (ref 13.0–17.0)
MCH: 24.7 pg — AB (ref 26.0–34.0)
MCHC: 32.8 g/dL (ref 30.0–36.0)
MCV: 75.3 fL — ABNORMAL LOW (ref 78.0–100.0)
PLATELETS: 235 10*3/uL (ref 150–400)
RBC: 4.21 MIL/uL — AB (ref 4.22–5.81)
RDW: 22.8 % — ABNORMAL HIGH (ref 11.5–15.5)
WBC: 7.5 10*3/uL (ref 4.0–10.5)

## 2017-03-10 LAB — GLUCOSE, CAPILLARY
GLUCOSE-CAPILLARY: 68 mg/dL (ref 65–99)
GLUCOSE-CAPILLARY: 86 mg/dL (ref 65–99)
GLUCOSE-CAPILLARY: 220 mg/dL — AB (ref 65–99)
GLUCOSE-CAPILLARY: 137 mg/dL — AB (ref 65–99)
Glucose-Capillary: 82 mg/dL (ref 65–99)

## 2017-03-10 LAB — MAGNESIUM: Magnesium: 2.3 mg/dL (ref 1.7–2.4)

## 2017-03-10 MED ORDER — SODIUM CHLORIDE 0.9 % IV SOLN: 100.0000 mL | INTRAVENOUS | Status: DC | PRN

## 2017-03-10 MED ORDER — CEFAZOLIN SODIUM-DEXTROSE 2-4 GM/100ML-% IV SOLN
2.0000 g | INTRAVENOUS | Status: AC
  Administered 2017-03-11: 2 g via INTRAVENOUS
  Filled 2017-03-10: qty 100

## 2017-03-10 MED ORDER — SODIUM CHLORIDE 0.9 % IV SOLN
100.0000 mL | INTRAVENOUS | Status: DC | PRN
Start: 2017-03-10 — End: 2017-03-11

## 2017-03-10 MED ORDER — LIDOCAINE-PRILOCAINE 2.5-2.5 % EX CREA: 1.0000 "application " | TOPICAL_CREAM | CUTANEOUS | Status: DC | PRN

## 2017-03-10 MED ORDER — LIDOCAINE HCL (PF) 1 % IJ SOLN: 5.0000 mL | INTRAMUSCULAR | Status: DC | PRN

## 2017-03-10 MED ORDER — HEPARIN SODIUM (PORCINE) 1000 UNIT/ML DIALYSIS: 1000.0000 [IU] | INTRAMUSCULAR | Status: DC | PRN

## 2017-03-10 MED ORDER — PENTAFLUOROPROP-TETRAFLUOROETH EX AERO: 1.0000 "application " | INHALATION_SPRAY | CUTANEOUS | Status: DC | PRN

## 2017-03-10 MED ORDER — POTASSIUM CHLORIDE CRYS ER 20 MEQ PO TBCR
20.0000 meq | EXTENDED_RELEASE_TABLET | Freq: Once | ORAL | Status: AC
Start: 2017-03-10 — End: 2017-03-10
  Administered 2017-03-10: 20 meq via ORAL
  Filled 2017-03-10: qty 1

## 2017-03-10 MED ORDER — CHLORHEXIDINE GLUCONATE 4 % EX LIQD
60.0000 mL | Freq: Once | CUTANEOUS | Status: DC
  Filled 2017-03-10: qty 60

## 2017-03-10 MED ORDER — ALTEPLASE 2 MG IJ SOLR: 2.0000 mg | Freq: Once | INTRAMUSCULAR | Status: DC | PRN

## 2017-03-10 NOTE — Progress Notes (Signed)
PROGRESS NOTE    [name] Coba Sr.  YQM:[id number] DOB: [birth year] DOA: 03/07/2017 PCP: Patient, [name] Per   Chief Complaint  Patient presents with  . Foot Pain    Brief Narrative:  HPI on 03/08/17 by Ms. [name], PA [name] Sr. is a 41 y.o. male with medical history significant for ESRD on HD, last performed on 03/07/2017, hypertension, diabetes, combined systolic and diastolic CHF, PVD, CAD, tobacco abuse, and status post amputation of all toes on L foot and , R and great toe on 3/23 with stump revision on 5/29  by [name], presenting with bilateral foot pain and drainage with foul smelling odor, at the amputation site. He reported to ashy discoloration for about one week. Denies any fever chills nausea or vomiting or shortness of breath. He denies any chest pain. Of note, the patient had been on doxycycline for one month, which he felt he was not helping, despite being compliant to the med.  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6 (given ESRD, will give small dose of KCl for replacement) -Will obtain magnesium level -continue tele monitoring   Chronic combined systolic and diastolic heart failure -Echocardiogram 09/05/2014 showed EF of 52-84%, grade 2 diastolic dysfunction -Currently appears to be  euvolemic -Continue volume control with hemodialysis  Essential hypertension -Continue amlodipine and IV hydralazine as needed  Diabetes mellitus, type II -Continue Lantus, insulin sliding scale and CBG monitoring -We'll order hemoglobin A1c  Anemia of chronic disease -Baseline hemoglobin approximately 8-10, currently 10.4 -Continue to monitor CBC  Tobacco abuse -Smoking cessation discussed -Continue nicotine patch   Itchiness -unknown etiology, on chest, legs, back -Will order sarna cream  DVT Prophylaxis  heparin  Code Status: Full  Family Communication: None at bedside  Disposition Plan: Admitted. Pending surgery on on 7/27.   Consultants Orthopedic surgery Nephrology  Procedures  None  Antibiotics   Anti-infectives    Start     Dose/Rate Route Frequency Ordered Stop   03/09/17 1200  vancomycin (VANCOCIN) IVPB 1000 mg/200 mL premix     1,000 mg 200 mL/hr over 60 Minutes Intravenous Every M-W-F (Hemodialysis) 03/08/17 0115     03/08/17 0130  vancomycin (VANCOCIN) 2,000 mg in sodium chloride 0.9 % 500 mL IVPB     2,000 mg 250 mL/hr over 120 Minutes Intravenous  Once 03/08/17 0115 03/08/17 0343   03/08/17 0130  piperacillin-tazobactam (ZOSYN) IVPB 3.375 g     3.375 g 12.5 mL/hr over 240 Minutes Intravenous Every 12 hours 03/08/17 0115     03/08/17 0015  vancomycin (VANCOCIN) IVPB 1000 mg/200 mL premix  Status:  Discontinued     1,000 mg 200 mL/hr over 60 Minutes Intravenous  Once 03/08/17 0001 03/08/17 0111   03/08/17 0015  piperacillin-tazobactam (ZOSYN) IVPB 3.375 g  Status:  Discontinued     3.375 g 100 mL/hr over 30 Minutes Intravenous  Once 03/08/17 0001 03/08/17 0111      Subjective:  Vearl Allbaugh seen and examined today.  Currently has no complaints. Denies chest pain, shortness of breath, abdominal pain, nausea or vomiting. Currently eating breakfast. No complaints of dizziness or headache. Does have occasional "itchiness".   Objective:    Vitals:   03/09/17 1740 03/09/17 2044 03/10/17 0525 03/10/17 0900  BP: (!) 159/87 (!) 142/78 (!) 154/89 (!) 179/76  Pulse: 89 87 88 83  Resp: 15 16 17 20   Temp: 98.5 F (36.9 C) 98.7 F (37.1 C) 98.3 F (36.8 C) 97.7 F (36.5 C)  TempSrc: Oral Oral Oral Oral  SpO2: 90% 95% 94% 94%  Weight:  79.3 kg (174 lb 13.2 oz)    Height:        Intake/Output Summary (Last 24 hours) at 03/10/17 1101 Last data filed at 03/10/17 0900  Gross per 24 hour  Intake              940 ml  Output             3000 ml  Net            -2060 ml   Filed Weights   03/09/17 0710 03/09/17 1111 03/09/17 2044  Weight: 82.4 kg (181 lb 10.5 oz) 79.2 kg (174 lb 9.7 oz) 79.3 kg (174 lb 13.2 oz)   Exam  General: Well developed, well nourished, NAD, appears stated age  HEENT: NCAT, mucous membranes moist.   Cardiovascular: S1 S2 auscultated, RRR, no murmurs  Respiratory: Clear to auscultation bilaterally with equal chest rise  Abdomen: Soft, nontender, nondistended, + bowel sounds  Extremities: warm dry without cyanosis clubbing. Left foot/metarsal wound, R great toe amputated. LUE AVF  Neuro: AAOx3, nonfocal  Psych: Appropriate, pleasant  Data Reviewed: I have personally reviewed following labs and imaging studies  CBC:  Recent Labs Lab 03/07/17 2103 03/08/17 0958 03/09/17 0719 03/10/17 0502  WBC 5.9 5.4 6.7 7.5  NEUTROABS 3.8  --   --   --   HGB 11.1* 10.1* 10.4* 10.4*  HCT 34.0* 32.3* 32.5* 31.7*  MCV 73.8* 74.9* 75.4* 75.3*  PLT 233 212 227 115   Basic Metabolic Panel:  Recent Labs Lab 03/07/17 2103 03/08/17 0958 03/09/17 0719 03/10/17 0502  NA 134*  --  134* 133*  K 3.5  --  4.2 3.6  CL 93*  --  94* 92*  CO2 23  --  23 26  GLUCOSE 103*  --  70 63*  BUN 40*  --  54* 31*  CREATININE 10.64* 10.96* 12.38* 8.13*  CALCIUM 8.6*  --  8.8* 9.2  PHOS  --   --  8.2*  --    GFR: Estimated Creatinine Clearance: 13.5 mL/min (A) (by C-G formula based on SCr of 8.13 mg/dL  (H)). Liver Function Tests:  Recent Labs Lab 03/07/17 2103 03/09/17 0719  AST 25  --   ALT 14*  --   ALKPHOS 137*  --   BILITOT 1.5*  --   PROT 7.1  --   ALBUMIN 3.5 3.2*   No results for input(s): LIPASE, AMYLASE in the last 168 hours. No results for input(s): AMMONIA in the last 168 hours. Coagulation Profile: No results for input(s): INR, PROTIME in the last 168 hours. Cardiac Enzymes: No results for input(s): CKTOTAL, CKMB, CKMBINDEX, TROPONINI in the last 168 hours. BNP (last 3 results) No results for input(s): PROBNP in the last 8760 hours. HbA1C: No results for input(s): HGBA1C in the last 72 hours. CBG:  Recent Labs Lab 03/09/17 1149  03/09/17 1651 03/09/17 2039 03/10/17 0730 03/10/17 0801  GLUCAP 77 104* 178* 68 86   Lipid Profile: No results for input(s): CHOL, HDL, LDLCALC, TRIG, CHOLHDL, LDLDIRECT in the last 72 hours. Thyroid Function Tests: No results for input(s): TSH, T4TOTAL, FREET4, T3FREE, THYROIDAB in the last 72 hours. Anemia Panel: No results for input(s): VITAMINB12, FOLATE, FERRITIN, TIBC, IRON, RETICCTPCT in the last 72 hours. Urine analysis:    Component Value Date/Time   COLORURINE RED BIOCHEMICALS MAY BE AFFECTED BY COLOR (A) 01/29/2010 0939   APPEARANCEUR CLOUDY (A) 01/29/2010 0939   LABSPEC 1.020 01/29/2010 0939   PHURINE 8.0 01/29/2010 0939   GLUCOSEU NEGATIVE 01/29/2010 0939   HGBUR LARGE (A) 01/29/2010 0939   BILIRUBINUR MODERATE (A) 01/29/2010 0939   KETONESUR 15 (A) 01/29/2010 0939   PROTEINUR >300 (A) 01/29/2010 0939   UROBILINOGEN 0.2 01/29/2010 0939   NITRITE POSITIVE (A) 01/29/2010 0939   LEUKOCYTESUR LARGE (A) 01/29/2010 0939   Sepsis Labs: @LABRCNTIP (procalcitonin:4,lacticidven:4)  ) Recent Results (from the past 240 hour(s))  Blood culture (routine x 2)     Status: None (Preliminary result)   Collection Time: 03/07/17 10:58 PM  Result Value Ref Range Status   Specimen Description BLOOD RIGHT FOREARM  Final    Special Requests IN PEDIATRIC BOTTLE Blood Culture adequate volume  Final   Culture NO GROWTH 1 DAY  Final   Report Status PENDING  Incomplete  Blood culture (routine x 2)     Status: None (Preliminary result)   Collection Time: 03/07/17 11:04 PM  Result Value Ref Range Status   Specimen Description BLOOD RIGHT HAND  Final   Special Requests   Final    BOTTLES DRAWN AEROBIC AND ANAEROBIC Blood Culture adequate volume   Culture NO GROWTH 1 DAY  Final   Report Status PENDING  Incomplete  MRSA PCR Screening     Status: None   Collection Time: 03/08/17  5:17 PM  Result Value Ref Range Status   MRSA by PCR NEGATIVE NEGATIVE Final    Comment:        The GeneXpert MRSA Assay (FDA approved for NASAL specimens only), is one component of a comprehensive MRSA colonization surveillance program. It is not intended to diagnose MRSA infection nor to guide or monitor treatment for MRSA infections.       Radiology Studies: No results found.   Scheduled Meds: . amLODipine  10 mg Oral QHS  . calcitRIOL  2.25 mcg Oral Q M,W,F-HD  . calcium acetate  1,334 mg Oral TID WC  . heparin  5,000 Units Subcutaneous Q8H  . insulin aspart  0-9 Units Subcutaneous TID WC  . insulin glargine  7 Units Subcutaneous QHS  . lanthanum  2,000 mg Oral TID WC  . multivitamin  1 tablet Oral QHS  . nicotine  21 mg Transdermal Daily   Continuous Infusions: . piperacillin-tazobactam (ZOSYN)  IV 3.375 g (03/10/17 0605)  . vancomycin Stopped (03/09/17 1105)     LOS: 2 days   Time Spent in minutes   30 minutes  [name] D.O. on 03/10/2017 at 11:01 AM  Between 7am to 7pm - Pager - [phone]  After 7pm go to [email or web address] - password [email or web address]  And look for the night coverage person covering for me after hours  [facility] Office  [phone]

## 2017-03-10 NOTE — Progress Notes (Signed)
[facility] Progress Note  Subjective: no new c/o's.    Objective Vitals:   03/09/17 1740 03/09/17 2044 03/10/17 0525 03/10/17 0900  BP: (!) 159/87 (!) 142/78 (!) 154/89 (!) 179/76  Pulse: 89 87 88 83  Resp: 15 16 17 20   Temp: 98.5 F (36.9 C) 98.7 F (37.1 C) 98.3 F (36.8 C) 97.7 F (36.5 C)  TempSrc: Oral Oral Oral Oral  SpO2: 90% 95% 94% 94%  Weight:  79.3 kg (174 lb 13.2 oz)    Height:       Physical Exam General: NAD  Heart: RRR  Lungs: CTAB Abdomen: soft NT  Extremities: L foot with open metatarsal wound +purulence +odor; R toe amp sites +purulent drainage  Dialysis Access: LUE AVF    Additional Objective Labs: Basic Metabolic Panel:  Recent Labs Lab 03/07/17 2103 03/08/17 0958 03/09/17 0719 03/10/17 0502  NA 134*  --  134* 133*  K 3.5  --  4.2 3.6  CL 93*  --  94* 92*  CO2 23  --  23 26  GLUCOSE 103*  --  70 63*  BUN 40*  --  54* 31*  CREATININE 10.64* 10.96* 12.38* 8.13*  CALCIUM 8.6*  --  8.8* 9.2  PHOS  --   --  8.2*  --    Liver Function Tests:  Recent Labs Lab 03/07/17 2103 03/09/17 0719  AST 25  --   ALT 14*  --   ALKPHOS 137*  --   BILITOT 1.5*  --   PROT 7.1  --   ALBUMIN 3.5 3.2*   No results for input(s): LIPASE, AMYLASE in the last 168 hours. CBC:  Recent Labs Lab 03/07/17 2103 03/08/17 0958 03/09/17 0719 03/10/17 0502  WBC 5.9 5.4 6.7 7.5  NEUTROABS 3.8  --   --   --   HGB 11.1* 10.1* 10.4* 10.4*  HCT 34.0* 32.3* 32.5* 31.7*  MCV 73.8* 74.9* 75.4* 75.3*  PLT 233 212 227 235   Blood Culture    Component Value Date/Time   SDES BLOOD RIGHT HAND 03/07/2017 2304   SPECREQUEST  03/07/2017 2304    BOTTLES DRAWN AEROBIC AND ANAEROBIC Blood Culture adequate volume   CULT NO GROWTH 2 DAYS 03/07/2017 2304   REPTSTATUS PENDING 03/07/2017 2304    Cardiac Enzymes: No results for input(s): CKTOTAL, CKMB, CKMBINDEX, TROPONINI in the last 168 hours. CBG:  Recent Labs Lab 03/09/17 1651 03/09/17 2039  03/10/17 0730 03/10/17 0801 03/10/17 1211  GLUCAP 104* 178* 68 86 82   Iron Studies: No results for input(s): IRON, TIBC, TRANSFERRIN, FERRITIN in the last 72 hours. Lab Results  Component Value Date   INR 1.05 09/13/2014   INR 1.05 09/27/2013   Medications: . piperacillin-tazobactam (ZOSYN)  IV Stopped (03/10/17 1005)  . vancomycin Stopped (03/09/17 1105)   . amLODipine  10 mg Oral QHS  . calcitRIOL  2.25 mcg Oral Q M,W,F-HD  . calcium acetate  1,334 mg Oral TID WC  . heparin  5,000 Units Subcutaneous Q8H  . insulin aspart  0-9 Units Subcutaneous TID WC  . insulin glargine  7 Units Subcutaneous QHS  . lanthanum  2,000 mg Oral TID WC  . multivitamin  1 tablet Oral QHS  . nicotine  21 mg Transdermal Daily  . potassium chloride  20 mEq Oral Once   Dialysis Orders: [facility] MWF  4h   500/800  2/2 bath  P2   80.5kg  LUA AVF  Hep 8000 Venofer 100 mg IV  x10 (until 8/10) Calcitriol 2.25 mcg PO q HD BMM:  Fosrenol 1000 2 tabs ti/ Ca acetate 3 tabs tid   Assessment: 1.  Bilat foot wounds/osetomyelitis - per primary/ortho planning transtibial amputation Friday. On IV Vanc/Zosyn/  Blood cultures pending 2.  ESRD -  MWF. Continue on schedule. Next HD Wed 4K bath with K 3.5  3.  Hypertension/volume  - BP elevated, cont home meds/ Shoud improve with UF goal 3.5L  4.  Anemia  - Hgb 11.1 No ESA needs currently/Hold Fe with infection  5.  Metabolic bone disease -  Continue VDRA/binders Follow P with renal panel  6.  Nutrition - NPO presently Renal diet/vitamins when diet resumes  7. DM - per primary   Plan - HD tomorrow, 1st shift before surgery.      Kelly Splinter MD [employer] pager [phone]   03/10/2017, 12:54 PM

## 2017-03-11 ENCOUNTER — Encounter (HOSPITAL_COMMUNITY): Payer: Self-pay | Admitting: Surgery

## 2017-03-11 ENCOUNTER — Inpatient Hospital Stay (HOSPITAL_COMMUNITY)
Admission: EM | Disposition: A | Discharge status: Skilled Nursing Facility | Payer: Self-pay | Source: Home / Self Care | Attending: Internal Medicine

## 2017-03-11 ENCOUNTER — Inpatient Hospital Stay (HOSPITAL_COMMUNITY): Payer: Medicaid Other | Admitting: Anesthesiology

## 2017-03-11 DIAGNOSIS — E11628 Type 2 diabetes mellitus with other skin complications: Secondary | ICD-10-CM

## 2017-03-11 DIAGNOSIS — L089 Local infection of the skin and subcutaneous tissue, unspecified: Secondary | ICD-10-CM

## 2017-03-11 HISTORY — PX: AMPUTATION: SHX166

## 2017-03-11 LAB — BASIC METABOLIC PANEL
Anion gap: 15 (ref 5–15)
BUN: 46 mg/dL — AB (ref 6–20)
CALCIUM: 9 mg/dL (ref 8.9–10.3)
CO2: 25 mmol/L (ref 22–32)
CREATININE: 9.63 mg/dL — AB (ref 0.61–1.24)
Chloride: 92 mmol/L — ABNORMAL LOW (ref 101–111)
GFR calc Af Amer: 7 mL/min — ABNORMAL LOW (ref >60)
GFR, EST NON AFRICAN AMERICAN: 6 mL/min — AB (ref >60)
Glucose, Bld: 136 mg/dL — ABNORMAL HIGH (ref 65–99)
Potassium: 4.1 mmol/L (ref 3.5–5.1)
SODIUM: 132 mmol/L — AB (ref 135–145)

## 2017-03-11 LAB — GLUCOSE, CAPILLARY
GLUCOSE-CAPILLARY: 83 mg/dL (ref 65–99)
GLUCOSE-CAPILLARY: 136 mg/dL — AB (ref 65–99)
Glucose-Capillary: 80 mg/dL (ref 65–99)
Glucose-Capillary: 91 mg/dL (ref 65–99)
Glucose-Capillary: 82 mg/dL (ref 65–99)
Glucose-Capillary: 83 mg/dL (ref 65–99)

## 2017-03-11 LAB — CBC
HCT: 32.4 % — ABNORMAL LOW (ref 39.0–52.0)
Hemoglobin: 10.2 g/dL — ABNORMAL LOW (ref 13.0–17.0)
MCH: 23.3 pg — AB (ref 26.0–34.0)
MCHC: 31.5 g/dL (ref 30.0–36.0)
MCV: 74.1 fL — ABNORMAL LOW (ref 78.0–100.0)
PLATELETS: 257 10*3/uL (ref 150–400)
RBC: 4.37 MIL/uL (ref 4.22–5.81)
RDW: 22.1 % — AB (ref 11.5–15.5)
WBC: 7.1 10*3/uL (ref 4.0–10.5)

## 2017-03-11 LAB — SURGICAL PCR SCREEN
MRSA, PCR: NEGATIVE
Staphylococcus aureus: NEGATIVE

## 2017-03-11 SURGERY — AMPUTATION BELOW KNEE
Anesthesia: General | Laterality: Right

## 2017-03-11 MED ORDER — LIDOCAINE 2% (20 MG/ML) 5 ML SYRINGE
INTRAMUSCULAR | Status: AC
  Filled 2017-03-11: qty 10

## 2017-03-11 MED ORDER — LIDOCAINE 2% (20 MG/ML) 5 ML SYRINGE
INTRAMUSCULAR | Status: DC | PRN
  Administered 2017-03-11: 60 mg via INTRAVENOUS

## 2017-03-11 MED ORDER — OXYCODONE HCL 5 MG PO TABS
5.0000 mg | ORAL_TABLET | ORAL | Status: DC | PRN
  Administered 2017-03-11 – 2017-03-17 (×15): 10 mg via ORAL
  Filled 2017-03-11 (×14): qty 2

## 2017-03-11 MED ORDER — ONDANSETRON HCL 4 MG/2ML IJ SOLN
4.0000 mg | Freq: Four times a day (QID) | INTRAMUSCULAR | Status: DC | PRN
Start: 2017-03-11 — End: 2017-03-12
  Filled 2017-03-11: qty 2

## 2017-03-11 MED ORDER — FENTANYL CITRATE (PF) 250 MCG/5ML IJ SOLN
INTRAMUSCULAR | Status: DC | PRN
  Administered 2017-03-11: 50 ug via INTRAVENOUS
  Administered 2017-03-11: 25 ug via INTRAVENOUS
  Administered 2017-03-11 (×2): 50 ug via INTRAVENOUS
  Administered 2017-03-11: 25 ug via INTRAVENOUS
  Administered 2017-03-11: 50 ug via INTRAVENOUS
  Administered 2017-03-11 (×2): 25 ug via INTRAVENOUS
  Administered 2017-03-11 (×4): 50 ug via INTRAVENOUS

## 2017-03-11 MED ORDER — ONDANSETRON HCL 4 MG/2ML IJ SOLN: 4.0000 mg | Freq: Four times a day (QID) | INTRAMUSCULAR | Status: DC | PRN

## 2017-03-11 MED ORDER — METOCLOPRAMIDE HCL 5 MG/ML IJ SOLN
5.0000 mg | Freq: Three times a day (TID) | INTRAMUSCULAR | Status: DC | PRN
  Administered 2017-03-12: 10 mg via INTRAVENOUS
  Filled 2017-03-11: qty 2

## 2017-03-11 MED ORDER — SODIUM CHLORIDE 0.9 % IV SOLN
INTRAVENOUS | Status: DC
  Administered 2017-03-11 (×3): via INTRAVENOUS

## 2017-03-11 MED ORDER — LABETALOL HCL 5 MG/ML IV SOLN
5.0000 mg | INTRAVENOUS | Status: DC | PRN
  Administered 2017-03-11 (×3): 5 mg via INTRAVENOUS

## 2017-03-11 MED ORDER — HYDROMORPHONE HCL 1 MG/ML IJ SOLN
INTRAMUSCULAR | Status: AC
  Administered 2017-03-11: 0.5 mg via INTRAVENOUS
  Filled 2017-03-11: qty 2

## 2017-03-11 MED ORDER — FENTANYL CITRATE (PF) 250 MCG/5ML IJ SOLN
INTRAMUSCULAR | Status: AC
  Filled 2017-03-11: qty 5

## 2017-03-11 MED ORDER — ONDANSETRON HCL 4 MG/2ML IJ SOLN
INTRAMUSCULAR | Status: DC | PRN
  Administered 2017-03-11: 4 mg via INTRAVENOUS

## 2017-03-11 MED ORDER — MIDAZOLAM HCL 5 MG/5ML IJ SOLN
INTRAMUSCULAR | Status: DC | PRN
  Administered 2017-03-11: 2 mg via INTRAVENOUS

## 2017-03-11 MED ORDER — ONDANSETRON HCL 4 MG PO TABS: 4.0000 mg | ORAL_TABLET | Freq: Four times a day (QID) | ORAL | Status: DC | PRN

## 2017-03-11 MED ORDER — 0.9 % SODIUM CHLORIDE (POUR BTL) OPTIME
TOPICAL | Status: DC | PRN
  Administered 2017-03-11: 1000 mL

## 2017-03-11 MED ORDER — DEXMEDETOMIDINE HCL 200 MCG/2ML IV SOLN
INTRAVENOUS | Status: DC | PRN
  Administered 2017-03-11: 20 ug via INTRAVENOUS

## 2017-03-11 MED ORDER — OXYCODONE-ACETAMINOPHEN 5-325 MG PO TABS
ORAL_TABLET | ORAL | Status: AC
  Administered 2017-03-11: 2 via ORAL
  Filled 2017-03-11: qty 2

## 2017-03-11 MED ORDER — ACETAMINOPHEN 325 MG PO TABS: 650.0000 mg | ORAL_TABLET | Freq: Four times a day (QID) | ORAL | Status: DC | PRN

## 2017-03-11 MED ORDER — MIDAZOLAM HCL 2 MG/2ML IJ SOLN
2.0000 mg | Freq: Once | INTRAMUSCULAR | Status: AC
  Administered 2017-03-11: 2 mg via INTRAVENOUS

## 2017-03-11 MED ORDER — PROPOFOL 10 MG/ML IV BOLUS
INTRAVENOUS | Status: DC | PRN
  Administered 2017-03-11: 200 mg via INTRAVENOUS

## 2017-03-11 MED ORDER — PROPOFOL 10 MG/ML IV BOLUS
INTRAVENOUS | Status: AC
  Filled 2017-03-11: qty 20

## 2017-03-11 MED ORDER — METHOCARBAMOL 1000 MG/10ML IJ SOLN
500.0000 mg | Freq: Four times a day (QID) | INTRAVENOUS | Status: DC | PRN
  Administered 2017-03-11: 500 mg via INTRAVENOUS
  Filled 2017-03-11: qty 5

## 2017-03-11 MED ORDER — METOCLOPRAMIDE HCL 5 MG PO TABS: 5.0000 mg | ORAL_TABLET | Freq: Three times a day (TID) | ORAL | Status: DC | PRN

## 2017-03-11 MED ORDER — OXYCODONE HCL 5 MG/5ML PO SOLN: 5.0000 mg | Freq: Once | ORAL | Status: DC | PRN

## 2017-03-11 MED ORDER — HYDROMORPHONE HCL 1 MG/ML IJ SOLN
0.2500 mg | INTRAMUSCULAR | Status: DC | PRN
Start: 2017-03-11 — End: 2017-03-11
  Administered 2017-03-11 (×5): 0.5 mg via INTRAVENOUS

## 2017-03-11 MED ORDER — ONDANSETRON HCL 4 MG/2ML IJ SOLN
INTRAMUSCULAR | Status: AC
  Filled 2017-03-11: qty 4

## 2017-03-11 MED ORDER — METHOCARBAMOL 500 MG PO TABS
500.0000 mg | ORAL_TABLET | Freq: Four times a day (QID) | ORAL | Status: DC | PRN
  Administered 2017-03-12 – 2017-03-13 (×3): 500 mg via ORAL
  Filled 2017-03-11 (×3): qty 1

## 2017-03-11 MED ORDER — POLYETHYLENE GLYCOL 3350 17 G PO PACK: 17.0000 g | PACK | Freq: Every day | ORAL | Status: DC | PRN

## 2017-03-11 MED ORDER — HYDROMORPHONE HCL 1 MG/ML IJ SOLN
0.2500 mg | INTRAMUSCULAR | Status: DC | PRN
  Administered 2017-03-11 (×2): 0.5 mg via INTRAVENOUS

## 2017-03-11 MED ORDER — MIDAZOLAM HCL 2 MG/2ML IJ SOLN
INTRAMUSCULAR | Status: AC
  Filled 2017-03-11: qty 2

## 2017-03-11 MED ORDER — OXYCODONE-ACETAMINOPHEN 5-325 MG PO TABS
ORAL_TABLET | ORAL | Status: AC
  Filled 2017-03-11: qty 2

## 2017-03-11 MED ORDER — BISACODYL 10 MG RE SUPP: 10.0000 mg | Freq: Every day | RECTAL | Status: DC | PRN

## 2017-03-11 MED ORDER — SODIUM CHLORIDE 0.9 % IV SOLN
INTRAVENOUS | Status: DC
  Administered 2017-03-11: 22:00:00 via INTRAVENOUS

## 2017-03-11 MED ORDER — VANCOMYCIN HCL IN DEXTROSE 1-5 GM/200ML-% IV SOLN
INTRAVENOUS | Status: AC
  Administered 2017-03-11: 1000 mg via INTRAVENOUS
  Filled 2017-03-11: qty 200

## 2017-03-11 MED ORDER — HYDROMORPHONE HCL 1 MG/ML IJ SOLN
1.0000 mg | INTRAMUSCULAR | Status: DC | PRN
  Administered 2017-03-11 – 2017-03-12 (×6): 1 mg via INTRAVENOUS
  Filled 2017-03-11 (×6): qty 1

## 2017-03-11 MED ORDER — MAGNESIUM CITRATE PO SOLN: 1.0000 | Freq: Once | ORAL | Status: DC | PRN

## 2017-03-11 MED ORDER — LABETALOL HCL 5 MG/ML IV SOLN
INTRAVENOUS | Status: AC
  Administered 2017-03-11: 5 mg via INTRAVENOUS
  Filled 2017-03-11: qty 4

## 2017-03-11 MED ORDER — HYDROMORPHONE HCL 1 MG/ML IJ SOLN
INTRAMUSCULAR | Status: AC
  Administered 2017-03-11: 0.5 mg via INTRAVENOUS
  Filled 2017-03-11: qty 0.5

## 2017-03-11 MED ORDER — DEXAMETHASONE SODIUM PHOSPHATE 10 MG/ML IJ SOLN
INTRAMUSCULAR | Status: DC | PRN
  Administered 2017-03-11: 4 mg via INTRAVENOUS

## 2017-03-11 MED ORDER — ACETAMINOPHEN 650 MG RE SUPP: 650.0000 mg | Freq: Four times a day (QID) | RECTAL | Status: DC | PRN

## 2017-03-11 MED ORDER — DOCUSATE SODIUM 100 MG PO CAPS
100.0000 mg | ORAL_CAPSULE | Freq: Two times a day (BID) | ORAL | Status: DC
  Administered 2017-03-11 – 2017-03-16 (×4): 100 mg via ORAL
  Filled 2017-03-11 (×10): qty 1

## 2017-03-11 MED ORDER — OXYCODONE HCL 5 MG PO TABS: 5.0000 mg | ORAL_TABLET | Freq: Once | ORAL | Status: DC | PRN

## 2017-03-11 MED ORDER — DEXAMETHASONE SODIUM PHOSPHATE 10 MG/ML IJ SOLN
INTRAMUSCULAR | Status: AC
  Filled 2017-03-11: qty 2

## 2017-03-11 SURGICAL SUPPLY — 47 items
BLADE SAW RECIP 87.9 MT (BLADE) ×4 IMPLANT
BLADE SURG 21 STRL SS (BLADE) ×4 IMPLANT
BNDG CMPR 9X4 STRL LF SNTH (GAUZE/BANDAGES/DRESSINGS)
BNDG COHESIVE 4X5 TAN STRL (GAUZE/BANDAGES/DRESSINGS) ×4 IMPLANT
BNDG COHESIVE 6X5 TAN STRL LF (GAUZE/BANDAGES/DRESSINGS) ×6 IMPLANT
BNDG ESMARK 4X9 LF (GAUZE/BANDAGES/DRESSINGS) IMPLANT
BNDG GAUZE ELAST 4 BULKY (GAUZE/BANDAGES/DRESSINGS) ×6 IMPLANT
CANISTER SUCTION 1500CC (MISCELLANEOUS) ×2 IMPLANT
COVER SURGICAL LIGHT HANDLE (MISCELLANEOUS) ×6 IMPLANT
CUFF TOURNIQUET SINGLE 34IN LL (TOURNIQUET CUFF) IMPLANT
CUFF TOURNIQUET SINGLE 44IN (TOURNIQUET CUFF) IMPLANT
DRAPE INCISE IOBAN 66X45 STRL (DRAPES) ×4 IMPLANT
DRAPE U-SHAPE 47X51 STRL (DRAPES) ×4 IMPLANT
DRSG ADAPTIC 3X8 NADH LF (GAUZE/BANDAGES/DRESSINGS) ×2 IMPLANT
DRSG PAD ABDOMINAL 8X10 ST (GAUZE/BANDAGES/DRESSINGS) ×4 IMPLANT
DRSG VAC ATS LRG SENSATRAC (GAUZE/BANDAGES/DRESSINGS) ×2 IMPLANT
DRSG VAC ATS MED SENSATRAC (GAUZE/BANDAGES/DRESSINGS) ×4 IMPLANT
DURAPREP 26ML APPLICATOR (WOUND CARE) ×6 IMPLANT
ELECT REM PT RETURN 9FT ADLT (ELECTROSURGICAL) ×4
ELECTRODE REM PT RTRN 9FT ADLT (ELECTROSURGICAL) ×2 IMPLANT
GAUZE SPONGE 4X4 12PLY STRL (GAUZE/BANDAGES/DRESSINGS) IMPLANT
GAUZE SPONGE 4X4 12PLY STRL LF (GAUZE/BANDAGES/DRESSINGS) ×2 IMPLANT
GLOVE BIOGEL PI IND STRL 9 (GLOVE) ×2 IMPLANT
GLOVE BIOGEL PI INDICATOR 9 (GLOVE) ×2
GLOVE SURG ORTHO 9.0 STRL STRW (GLOVE) ×4 IMPLANT
GOWN STRL REUS W/ TWL XL LVL3 (GOWN DISPOSABLE) ×4 IMPLANT
GOWN STRL REUS W/TWL XL LVL3 (GOWN DISPOSABLE) ×12
KIT BASIN OR (CUSTOM PROCEDURE TRAY) ×4 IMPLANT
KIT ROOM TURNOVER OR (KITS) ×4 IMPLANT
MANIFOLD NEPTUNE II (INSTRUMENTS) ×2 IMPLANT
NEEDLE 22X1 1/2 (OR ONLY) (NEEDLE) IMPLANT
NS IRRIG 1000ML POUR BTL (IV SOLUTION) ×4 IMPLANT
PACK ORTHO EXTREMITY (CUSTOM PROCEDURE TRAY) ×4 IMPLANT
PAD ARMBOARD 7.5X6 YLW CONV (MISCELLANEOUS) ×6 IMPLANT
PREVENA INCISION MGT 90 150 (MISCELLANEOUS) ×2 IMPLANT
SPONGE LAP 18X18 X RAY DECT (DISPOSABLE) ×2 IMPLANT
STAPLER VISISTAT 35W (STAPLE) ×2 IMPLANT
STOCKINETTE IMPERVIOUS LG (DRAPES) ×4 IMPLANT
SUT ETHILON 2 0 PSLX (SUTURE) ×10 IMPLANT
SUT SILK 2 0 (SUTURE) ×4
SUT SILK 2-0 18XBRD TIE 12 (SUTURE) ×2 IMPLANT
SUT VIC AB 1 CTX 27 (SUTURE) IMPLANT
SYR CONTROL 10ML LL (SYRINGE) IMPLANT
TOWEL OR 17X26 10 PK STRL BLUE (TOWEL DISPOSABLE) ×4 IMPLANT
TUBE CONNECTING 12'X1/4 (SUCTIONS) ×1
TUBE CONNECTING 12X1/4 (SUCTIONS) ×1 IMPLANT
YANKAUER SUCT BULB TIP NO VENT (SUCTIONS) ×2 IMPLANT

## 2017-03-11 NOTE — H&P (View-Only) (Signed)
ORTHOPAEDIC CONSULTATION  REQUESTING PHYSICIAN: Cristal Ford, DO  Chief Complaint: Dehiscence left transmetatarsal amputation with gangrene of the second toe right foot  HPI: [name] Duecker Sr. is a 41 y.o. male who presents with patient is a 41 year old gentleman with insulin-dependent diabetes end stage renal disease on dialysis who is status post transmetatarsal limb salvage intervention left status post great toe amputation on the right. Patient presents complaining of dehiscence of the transmetatarsal amputation of the left and gangrenous changes to these second toe right foot.  Past Medical History:  Diagnosis Date  . Anemia   . Atherosclerosis of lower extremity ([place])   . CAD (coronary artery disease)   . Chronic combined systolic and diastolic heart failure ([place])   . Complication of anesthesia   . ESRD (end stage renal disease) on dialysis [facility])    "MWF [employer]" (03/08/2017)  . GERD (gastroesophageal reflux disease)   . Heart murmur   . History of blood transfusion    "related to OR"  . Hypertension   . PONV (postoperative nausea and vomiting)   . Type II diabetes mellitus ([place])    Past Surgical History:  Procedure Laterality Date  . ABDOMINAL AORTOGRAM N/A 11/02/2016   Procedure: Abdominal Aortogram;  Surgeon: Waynetta Sandy, MD;  Location: [place] CV LAB;  Service: Cardiovascular;  Laterality: N/A;  . AMPUTATION Left 09/27/2013   Procedure: LEFT GREAT TOE AMPUTATION;  Surgeon: [name], MD;  Location: [place];  Service: Orthopedics;  Laterality: Left;  . AMPUTATION Right 08/15/2015   Procedure: Right Great Toe Amputation;  Surgeon: [name], MD;  Location: [place];  Service: Orthopedics;  Laterality: Right;  . AMPUTATION Left 11/05/2016   Procedure: TRANSMETATARSAL AMPUTATION LEFT FOOT;  Surgeon: [name], MD;  Location: [place];  Service: Orthopedics;  Laterality: Left;  . AV FISTULA PLACEMENT  left arm  . LEFT HEART  CATHETERIZATION WITH CORONARY ANGIOGRAM N/A 09/13/2014   Procedure: LEFT HEART CATHETERIZATION WITH CORONARY ANGIOGRAM;  Surgeon: Sinclair Grooms, MD;  Location: [facility] CATH LAB;  Service: Cardiovascular;  Laterality: N/A;  . LOWER EXTREMITY ANGIOGRAPHY Bilateral 11/02/2016   Procedure: Lower Extremity Angiography;  Surgeon: Waynetta Sandy, MD;  Location: [place] CV LAB;  Service: Cardiovascular;  Laterality: Bilateral;  . PERIPHERAL VASCULAR ATHERECTOMY Left 11/02/2016   Procedure: Peripheral Vascular Atherectomy;  Surgeon: Waynetta Sandy, MD;  Location: [place] CV LAB;  Service: Cardiovascular;  Laterality: Left;  PERONEAL  . STUMP REVISION Left 01/11/2017   Procedure: Revision Left Transmetatarsal Amputation;  Surgeon: [name], MD;  Location: [place];  Service: Orthopedics;  Laterality: Left;   Social History   Social History  . Marital status: Legally Separated    Spouse name: N/A  . Number of children: N/A  . Years of education: N/A   Social History Main Topics  . Smoking status: Current Every Day Smoker    Packs/day: 0.50    Years: 26.00    Types: Cigarettes  . Smokeless tobacco: Never Used  . Alcohol use Yes     Comment: 03/08/2017 "haven't took a drink in over 5 years"  . Drug use: Yes    Types: Marijuana     Comment: 03/08/2017 "basically q day"   . Sexual activity: Yes   Other Topics Concern  . None   Social History Narrative  . None   Family History  Problem Relation Age of Onset  . Heart failure Mother   .  Hypertension Mother    - negative except otherwise stated in the family history section Allergies  Allergen Reactions  . Coconut Oil Anaphylaxis    Can use topically, allergic to coconut foods    Prior to Admission medications   Medication Sig Start Date End Date Taking? Authorizing Provider  acetaminophen (TYLENOL) 500 MG tablet Take 1,000 mg by mouth every 6 (six) hours as needed for mild pain.   Yes [provider]    amLODipine (NORVASC) 10 MG tablet Take 1 tablet (10 mg total) by mouth at bedtime. 08/16/15  Yes Short, Noah Delaine, MD  calcium acetate (PHOSLO) 667 MG capsule Take 1 capsule (667 mg total) by mouth 3 (three) times daily with meals. Patient taking differently: Take 1,334 mg by mouth 3 (three) times daily with meals.  08/16/15  Yes Short, Noah Delaine, MD  clopidogrel (PLAVIX) 75 MG tablet Take 1 tablet (75 mg total) by mouth daily. 11/02/16  Yes Waynetta Sandy, MD  doxycycline (VIBRAMYCIN) 100 MG capsule Take 1 capsule (100 mg total) by mouth 2 (two) times daily. 02/11/17  Yes Hedges, Dellis Filbert, PA-C  glucagon (GLUCAGON EMERGENCY) 1 MG injection Inject 1 mg into the vein once as needed (low blood sugar and inability to eat or drink sugar). 08/16/15  Yes Short, Noah Delaine, MD  insulin glargine (LANTUS) 100 UNIT/ML injection Inject 0.1 mLs (10 Units total) into the skin at bedtime. Patient taking differently: Inject 5 Units into the skin at bedtime.  09/15/14  Yes Francesca Oman, DO  lanthanum (FOSRENOL) 1000 MG chewable tablet Chew 2 tablets (2,000 mg total) by mouth 3 (three) times daily with meals. Patient taking differently: Chew 1,000 mg by mouth 3 (three) times daily with meals. May take an additional 1000 - 2000 mgs with snacks 08/16/15  Yes Short, Mackenzie, MD  oxyCODONE-acetaminophen (PERCOCET/ROXICET) 5-325 MG tablet Take 2 tablets by mouth every 4 (four) hours as needed for severe pain. 02/11/17  Yes Okey Regal, PA-C   Dg Foot Complete Left  Result Date: 03/07/2017 CLINICAL DATA:  Diabetic foot ulcer EXAM: LEFT FOOT - COMPLETE 3+ VIEW COMPARISON:  09/11/2016 FINDINGS: There is been interval transmetatarsal amputation performed. Vascular calcifications are seen. A large soft tissue wound is noted along the stump. Some lucency is noted within the residual first and second metatarsals suggestive of osteomyelitis. No other focal abnormality is seen. IMPRESSION: Large soft tissue wound with  changes of erosion in the first and second metatarsal remnants suggestive of osteomyelitis. Electronically Signed   By: Inez Catalina M.D.   On: 03/07/2017 22:46   Dg Foot Complete Right  Result Date: 03/07/2017 CLINICAL DATA:  Diabetic foot ulcer with second toe pain, initial encounter EXAM: RIGHT FOOT COMPLETE - 3+ VIEW COMPARISON:  02/11/2017 FINDINGS: First toe amputation is again identified. Diffuse vascular calcifications are seen. The diabetic foot wound is not well appreciated on this exam. No bony erosion to suggest osteomyelitis is noted. IMPRESSION: Prior surgical change.  No definitive osteomyelitis is noted. Electronically Signed   By: Inez Catalina M.D.   On: 03/07/2017 22:47   - pertinent xrays, CT, MRI studies were reviewed and independently interpreted  Positive ROS: All other systems have been reviewed and were otherwise negative with the exception of those mentioned in the HPI and as above.  Physical Exam: General: Alert, no acute distress Psychiatric: Patient is competent for consent with normal mood and affect Lymphatic: No axillary or cervical lymphadenopathy Cardiovascular: No pedal edema Respiratory: No cyanosis, no use of accessory musculature  GI: No organomegaly, abdomen is soft and non-tender  Skin: Examination patient has dry gangrene of second toe right foot. He has a faintly palpable pulse. Examination the left foot has dehiscence of the transmetatarsal amputation his foot is painful to palpation there is exposed bone there is no abscess no ascending cellulitis he does have a palpable pulse.   Neurologic: Patient does not have protective sensation bilateral lower extremities.   MUSCULOSKELETAL:  Examination patient has been atrophic legs he has failed surgical intervention for transmetatarsal amputation left and has healed a great toe amputation the right and now has gangrenous changes of the second toe.  Assessment: Assessment: Diabetic insensate neuropathy  end-stage renal disease on dialysis with dehiscence of the left transmetatarsal amputation and gangrenous changes of the right second toe with a well-healed great toe amputation.    Plan: Plan: Discussed with the patient recommendation proceed with a transtibial amputation on the left. Patient states he would like to also proceed with a right second toe amputation. Discussed the increased risk of the wound not healing. Patient states he understands wishes to proceed at this time. Plan for surgery on Friday.  Thank you for the consult and the opportunity to see [name], [place] [phone] 7:17 AM

## 2017-03-11 NOTE — Progress Notes (Signed)
Pharmacy Antibiotic Note  [name] Schreck Sr. is a 41 y.o. male admitted on 03/07/2017 with bilateral diabetic foot wounds, possible osteomyelitis on the left.  Pharmacy has been consulted for Vancomycin/Zosyn dosing. Was on PO doxycycline PTA with little to no improvement.  Pt has ESRD on HD MWF- last went for HD this morning. Has been tolerating full sessions during this admission.  Going for L BKA and R 2nd toe amputation.  Plan: Vancomycin 1000 mg IV qHD MWF Zosyn 3.375G IV q12h to be infused over 4 hours Follow c/s, clinical progression, HD schedule/tolerance, pre-HD vancomycin level as needed Follow length of therapy of antibiotics after amputation  Height: 6\' 2"  (188 cm) Weight: 184 lb 11.9 oz (83.8 kg) IBW/kg (Calculated) : 82.2  Temp (24hrs), Avg:99 F (37.2 C), Min:98.6 F (37 C), Max:99.4 F (37.4 C)   Recent Labs Lab 03/07/17 2048 03/07/17 2103 03/08/17 0107 03/08/17 0958 03/09/17 0719 03/10/17 0502 03/11/17 0359  WBC  --  5.9  --  5.4 6.7 7.5 7.1  CREATININE  --  10.64*  --  10.96* 12.38* 8.13* 9.63*  LATICACIDVEN 1.96*  --  1.50  --   --   --   --     Estimated Creatinine Clearance: 11.9 mL/min (A) (by C-G formula based on SCr of 9.63 mg/dL (H)).    Allergies  Allergen Reactions  . Coconut Oil Anaphylaxis    Can use topically, allergic to coconut foods    Vanc 7/24 >> Zosyn 7/24 >>  7/26 MRSA surg screen: neg 7/24 MRSA PCR: neg 7/23 BCx: ngtd   Mairi Stagliano D. Tarris Delbene, PharmD, BCPS Clinical Pharmacist Pager: [phone] Clinical Phone for 03/11/2017 until 3:30pm: [phone] If after 3:30pm, please call main pharmacy at [phone] 03/11/2017 10:28 AM

## 2017-03-11 NOTE — Transfer of Care (Signed)
Immediate Anesthesia Transfer of Care Note  Patient: [name] Nims Sr.  Procedure(s) Performed: Procedure(s): LEFT BELOW KNEE AMPUTATION (Left) RIGHT 2ND TOE AMPUTATION (Right)  Patient Location: PACU  Anesthesia Type:General  Level of Consciousness: awake, alert , oriented and pateint uncooperative   Airway & Oxygen Therapy: Patient Spontanous Breathing and Patient connected to nasal cannula oxygen  Post-op Assessment: Report given to RN and Post -op Vital signs reviewed and stable  Post vital signs: Reviewed and stable  Last Vitals:  Vitals:   03/11/17 1415 03/11/17 1715  BP: (!) 147/59   Pulse: 83 (!) 107  Resp: (!) 22   Temp: 37.1 C 36.8 C    Last Pain:  Vitals:   03/11/17 1415  TempSrc: Oral  PainSc: 7       Patients Stated Pain Goal: 3 (16/01/09 3235)  Complications: No apparent anesthesia complications

## 2017-03-11 NOTE — Progress Notes (Signed)
[facility] Progress Note  Subjective: stable, no new c/o's.  ON HD   Objective Vitals:   03/11/17 0800 03/11/17 0830 03/11/17 0900 03/11/17 0930  BP: (!) 189/78 (!) 140/59 (!) 168/87 (!) 160/84  Pulse: 82 85 82 83  Resp:      Temp:      TempSrc:      SpO2:      Weight:      Height:       Physical Exam General: NAD  Heart: RRR  Lungs: CTAB Abdomen: soft NT  Extremities: L foot wrapped; R toe amp wrapped Dialysis Access: LUE AVF    Additional Objective Labs: Basic Metabolic Panel:  Recent Labs Lab 03/09/17 0719 03/10/17 0502 03/11/17 0359  NA 134* 133* 132*  K 4.2 3.6 4.1  CL 94* 92* 92*  CO2 23 26 25   GLUCOSE 70 63* 136*  BUN 54* 31* 46*  CREATININE 12.38* 8.13* 9.63*  CALCIUM 8.8* 9.2 9.0  PHOS 8.2*  --   --    Liver Function Tests:  Recent Labs Lab 03/07/17 2103 03/09/17 0719  AST 25  --   ALT 14*  --   ALKPHOS 137*  --   BILITOT 1.5*  --   PROT 7.1  --   ALBUMIN 3.5 3.2*   No results for input(s): LIPASE, AMYLASE in the last 168 hours. CBC:  Recent Labs Lab 03/07/17 2103 03/08/17 0958 03/09/17 0719 03/10/17 0502 03/11/17 0359  WBC 5.9 5.4 6.7 7.5 7.1  NEUTROABS 3.8  --   --   --   --   HGB 11.1* 10.1* 10.4* 10.4* 10.2*  HCT 34.0* 32.3* 32.5* 31.7* 32.4*  MCV 73.8* 74.9* 75.4* 75.3* 74.1*  PLT 233 212 227 235 257   Blood Culture    Component Value Date/Time   SDES BLOOD RIGHT HAND 03/07/2017 2304   SPECREQUEST  03/07/2017 2304    BOTTLES DRAWN AEROBIC AND ANAEROBIC Blood Culture adequate volume   CULT NO GROWTH 2 DAYS 03/07/2017 2304   REPTSTATUS PENDING 03/07/2017 2304    Cardiac Enzymes: No results for input(s): CKTOTAL, CKMB, CKMBINDEX, TROPONINI in the last 168 hours. CBG:  Recent Labs Lab 03/10/17 0730 03/10/17 0801 03/10/17 1211 03/10/17 1716 03/10/17 2112  GLUCAP 68 86 82 220* 137*   Iron Studies: No results for input(s): IRON, TIBC, TRANSFERRIN, FERRITIN in the last 72 hours. Lab Results   Component Value Date   INR 1.05 09/13/2014   INR 1.05 09/27/2013   Medications: . sodium chloride    . sodium chloride    .  ceFAZolin (ANCEF) IV    . piperacillin-tazobactam (ZOSYN)  IV Stopped (03/10/17 2247)  . vancomycin 1,000 mg (03/11/17 0935)   . amLODipine  10 mg Oral QHS  . calcitRIOL  2.25 mcg Oral Q M,W,F-HD  . calcium acetate  1,334 mg Oral TID WC  . chlorhexidine  60 mL Topical Once  . heparin  5,000 Units Subcutaneous Q8H  . insulin aspart  0-9 Units Subcutaneous TID WC  . insulin glargine  7 Units Subcutaneous QHS  . lanthanum  2,000 mg Oral TID WC  . multivitamin  1 tablet Oral QHS  . nicotine  21 mg Transdermal Daily   Dialysis Orders: [facility] MWF  4h   500/800  2/2 bath  P2   80.5kg  LUA AVF  Hep 8000 Venofer 100 mg IV x10 (until 8/10) Calcitriol 2.25 mcg PO q HD BMM:  Fosrenol 1000 2 tabs ti/ Ca acetate 3  tabs tid   Assessment: 1.  L TMA wound dehisced/ R 1st toe gangrene - per primary/ortho planning L BKA today. On IV Vanc/Zosyn/  Blood cultures neg 2.  ESRD -  MWF HD 3.  Hypertension/volume  - BP ok, up 2-3kg 4.  Anemia  - Hgb 11.1 No ESA needs currently/Hold Fe with infection  5.  Metabolic bone disease -  Continue VDRA/binders Follow P with renal panel  6.  Nutrition - NPO presently Renal diet/vitamins when diet resumes  7. DM - per primary   Plan - HD today , to OR this afternoon     Kelly Splinter MD [place] pager [phone]   03/11/2017, 10:00 AM

## 2017-03-11 NOTE — Interval H&P Note (Signed)
History and Physical Interval Note:  03/11/2017 6:58 AM  [name] Labella Sr.  has presented today for surgery, with the diagnosis of Dehiscence Left Transmetatarsal Amputation and Osteomyelitis Right 2nd Toe  The various methods of treatment have been discussed with the patient and family. After consideration of risks, benefits and other options for treatment, the patient has consented to  Procedure(s): LEFT BELOW KNEE AMPUTATION (Left) RIGHT 2ND TOE AMPUTATION (Right) as a surgical intervention .  The patient's history has been reviewed, patient examined, no change in status, stable for surgery.  I have reviewed the patient's chart and labs.  Questions were answered to the patient's satisfaction.     [name]

## 2017-03-11 NOTE — Anesthesia Procedure Notes (Signed)
Procedure Name: LMA Insertion Date/Time: 03/11/2017 4:12 PM Performed by: Merrilyn Puma B Pre-anesthesia Checklist: Patient identified, Emergency Drugs available, Suction available, Patient being monitored and Timeout performed Patient Re-evaluated:Patient Re-evaluated prior to induction Oxygen Delivery Method: Circle system utilized Preoxygenation: Pre-oxygenation with 100% oxygen Induction Type: IV induction Ventilation: Mask ventilation without difficulty LMA: LMA inserted LMA Size: 5.0 Placement Confirmation: positive ETCO2 and breath sounds checked- equal and bilateral Tube secured with: Tape Dental Injury: Teeth and Oropharynx as per pre-operative assessment

## 2017-03-11 NOTE — Progress Notes (Signed)
Patient arrived to unit per bed.  Reviewed treatment plan and this RN agrees.  Report received from bedside RN, Kirstin.  Consent verified.  Patient A & O X 4. Lung sounds diminished to ausculation in all fields. No edema. Cardiac: NSR.  Prepped LUAVF with alcohol and cannulated with two 15 gauge needles.  Pulsation of blood noted.  Flushed access well with saline per protocol.  Connected and secured lines and initiated tx at 0651.  UF goal of 2500 mL and net fluid removal of 2000 mL.  Will continue to monitor.

## 2017-03-11 NOTE — Progress Notes (Signed)
PROGRESS NOTE    [name] [name].  XIP:[id number] DOB: 1975/09/22 DOA: 03/07/2017 PCP: Patient, [name] Per   Chief Complaint  Patient presents with  . Foot Pain    Brief Narrative:  HPI on 03/08/17 by Ms. [name] Butters, PA [name] [name]. is a 41 y.o. male with medical history significant for ESRD on HD, last performed on 03/07/2017, hypertension, diabetes, combined systolic and diastolic CHF, PVD, CAD, tobacco abuse, and status post amputation of all toes on L foot and , R and great toe on 3/23 with stump revision on 5/29  by [name] [name], presenting with bilateral foot pain and drainage with foul smelling odor, at the amputation site. He reported to ashy discoloration for about one week. Denies any fever chills nausea or vomiting or shortness of breath. He denies any chest pain. Of note, the patient had been on doxycycline for one month, which he felt he was not helping, despite being compliant to the med.  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 with gangrenous changes -In the setting of diabetes and PVD -Post transmetatarsal indication on the left, right great toe and patient 3/23 with a stump revision on 5/29 -Currently on IV vancomycin and Zosyn -Orthopedic surgery consultation appreciated, plan for surgical intervention today 03/11/2017- L BKA and right 2nd toe amputation  -Blood cultures shows no growth to date -Wound care consultation appreciated -Continue pain control  End-stage renal disease -Patient dialyzes MWF -Nephrology consultation appreciated  Nonsustained VTach -Supposedly had 9beat run of VT yesterday evening -Currently asymptomatic -potassium replaced on 7/26, currently 4.1 -magnesium level 2.3 -continue tele monitoring   Chronic combined systolic and diastolic heart failure -Echocardiogram 09/05/2014 showed EF of 67-34%, grade 2 diastolic dysfunction -Currently appears to be euvolemic -Continue  volume control with hemodialysis  Essential hypertension -Continue amlodipine and IV hydralazine as needed  Diabetes mellitus, type II -Continue Lantus, insulin sliding scale and CBG monitoring -Hemoglobin A1c pending   Anemia of chronic disease -Baseline hemoglobin approximately 8-10, currently 10.2 -Continue to monitor CBC  Tobacco abuse -Smoking cessation discussed -Continue nicotine patch   Itchiness -possibly related to ESRD as patient was scratching today upon examination in HD and was receiving no antibiotics -Continue sarna   DVT Prophylaxis  heparin  Code Status: Full  Family Communication: None at bedside  Disposition Plan: Admitted. Pending surgery today. Will likely need SNF  Consultants Orthopedic surgery Nephrology  Procedures  None  Antibiotics   Anti-infectives    Start     Dose/Rate Route Frequency Ordered Stop   03/11/17 1300  ceFAZolin (ANCEF) IVPB 2g/100 mL premix     2 g 200 mL/hr over 30 Minutes Intravenous To ShortStay Surgical 03/10/17 2148 03/12/17 1300   03/09/17 1200  vancomycin (VANCOCIN) IVPB 1000 mg/200 mL premix     1,000 mg 200 mL/hr over 60 Minutes Intravenous Every M-W-F (Hemodialysis) 03/08/17 0115     03/08/17 0130  vancomycin (VANCOCIN) 2,000 mg in sodium chloride 0.9 % 500 mL IVPB     2,000 mg 250 mL/hr over 120 Minutes Intravenous  Once 03/08/17 0115 03/08/17 0343   03/08/17 0130  piperacillin-tazobactam (ZOSYN) IVPB 3.375 g     3.375 g 12.5 mL/hr over 240 Minutes Intravenous Every 12 hours 03/08/17 0115     03/08/17 0015  vancomycin (VANCOCIN) IVPB 1000 mg/200 mL premix  Status:  Discontinued     1,000 mg 200 mL/hr over 60 Minutes Intravenous  Once 03/08/17 0001  03/08/17 0111   03/08/17 0015  piperacillin-tazobactam (ZOSYN) IVPB 3.375 g  Status:  Discontinued     3.375 g 100 mL/hr over 30 Minutes Intravenous  Once 03/08/17 0001 03/08/17 0111      Subjective:   [name] [name] seen and examined today. No complaints  today. Still has "itchiness". Denies chest pain, shortness of breath, abdominal pain, N/V/D/C, dizziness, headache.   Objective:   Vitals:   03/11/17 0830 03/11/17 0900 03/11/17 0930 03/11/17 1000  BP: (!) 140/59 (!) 168/87 (!) 160/84 (!) 180/63  Pulse: 85 82 83 85  Resp:      Temp:      TempSrc:      SpO2:      Weight:      Height:        Intake/Output Summary (Last 24 hours) at 03/11/17 1033 Last data filed at 03/11/17 0200  Gross per 24 hour  Intake                0 ml  Output                0 ml  Net                0 ml   Filed Weights   03/09/17 1111 03/09/17 2044 03/11/17 0636  Weight: 79.2 kg (174 lb 9.7 oz) 79.3 kg (174 lb 13.2 oz) 83.8 kg (184 lb 11.9 oz)   Exam  [name]: Well developed, well nourished, NAD, appears stated age  HEENT: NCAT,  mucous membranes moist.   Cardiovascular: S1 S2 auscultated, no rubs, murmurs or gallops. Regular rate and rhythm.  Respiratory: Clear to auscultation bilaterally with equal chest rise  Abdomen: Soft, nontender, nondistended, + bowel sounds  Extremities: warm dry without cyanosis clubbing. R great toe amputated, Left metatarsal wound.    Neuro: AAOx3, Nonfocal  Psych: Appropriate mood and affect  Data Reviewed: I have personally reviewed following labs and imaging studies  CBC:  Recent Labs Lab 03/07/17 2103 03/08/17 0958 03/09/17 0719 03/10/17 0502 03/11/17 0359  WBC 5.9 5.4 6.7 7.5 7.1  NEUTROABS 3.8  --   --   --   --   HGB 11.1* 10.1* 10.4* 10.4* 10.2*  HCT 34.0* 32.3* 32.5* 31.7* 32.4*  MCV 73.8* 74.9* 75.4* 75.3* 74.1*  PLT 233 212 227 235 960   Basic Metabolic Panel:  Recent Labs Lab 03/07/17 2103 03/08/17 0958 03/09/17 0719 03/10/17 0502 03/10/17 1110 03/11/17 0359  NA 134*  --  134* 133*  --  132*  K 3.5  --  4.2 3.6  --  4.1  CL 93*  --  94* 92*  --  92*  CO2 23  --  23 26  --  25  GLUCOSE 103*  --  70 63*  --  136*  BUN 40*  --  54* 31*  --  46*  CREATININE 10.64* 10.96* 12.38*  8.13*  --  9.63*  CALCIUM 8.6*  --  8.8* 9.2  --  9.0  MG  --   --   --   --  2.3  --   PHOS  --   --  8.2*  --   --   --    GFR: Estimated Creatinine Clearance: 11.9 mL/min (A) (by C-G formula based on SCr of 9.63 mg/dL (H)). Liver Function Tests:  Recent Labs Lab 03/07/17 2103 03/09/17 0719  AST 25  --   ALT 14*  --   ALKPHOS 137*  --  BILITOT 1.5*  --   PROT 7.1  --   ALBUMIN 3.5 3.2*   No results for input(s): LIPASE, AMYLASE in the last 168 hours. No results for input(s): AMMONIA in the last 168 hours. Coagulation Profile: No results for input(s): INR, PROTIME in the last 168 hours. Cardiac Enzymes: No results for input(s): CKTOTAL, CKMB, CKMBINDEX, TROPONINI in the last 168 hours. BNP (last 3 results) No results for input(s): PROBNP in the last 8760 hours. HbA1C: No results for input(s): HGBA1C in the last 72 hours. CBG:  Recent Labs Lab 03/10/17 0730 03/10/17 0801 03/10/17 1211 03/10/17 1716 03/10/17 2112  GLUCAP 68 86 82 220* 137*   Lipid Profile: No results for input(s): CHOL, HDL, LDLCALC, TRIG, CHOLHDL, LDLDIRECT in the last 72 hours. Thyroid Function Tests: No results for input(s): TSH, T4TOTAL, FREET4, T3FREE, THYROIDAB in the last 72 hours. Anemia Panel: No results for input(s): VITAMINB12, FOLATE, FERRITIN, TIBC, IRON, RETICCTPCT in the last 72 hours. Urine analysis:    Component Value Date/Time   COLORURINE RED BIOCHEMICALS MAY BE AFFECTED BY COLOR (A) 01/29/2010 0939   APPEARANCEUR CLOUDY (A) 01/29/2010 0939   LABSPEC 1.020 01/29/2010 0939   PHURINE 8.0 01/29/2010 0939   GLUCOSEU NEGATIVE 01/29/2010 0939   HGBUR LARGE (A) 01/29/2010 0939   BILIRUBINUR MODERATE (A) 01/29/2010 0939   KETONESUR 15 (A) 01/29/2010 0939   PROTEINUR >300 (A) 01/29/2010 0939   UROBILINOGEN 0.2 01/29/2010 0939   NITRITE POSITIVE (A) 01/29/2010 0939   LEUKOCYTESUR LARGE (A) 01/29/2010 0939   Sepsis Labs: @LABRCNTIP (procalcitonin:4,lacticidven:4)  ) Recent  Results (from the past 240 hour(s))  Blood culture (routine x 2)     Status: None (Preliminary result)   Collection Time: 03/07/17 10:58 PM  Result Value Ref Range Status   Specimen Description BLOOD RIGHT FOREARM  Final   Special Requests IN PEDIATRIC BOTTLE Blood Culture adequate volume  Final   Culture NO GROWTH 2 DAYS  Final   Report Status PENDING  Incomplete  Blood culture (routine x 2)     Status: None (Preliminary result)   Collection Time: 03/07/17 11:04 PM  Result Value Ref Range Status   Specimen Description BLOOD RIGHT HAND  Final   Special Requests   Final    BOTTLES DRAWN AEROBIC AND ANAEROBIC Blood Culture adequate volume   Culture NO GROWTH 2 DAYS  Final   Report Status PENDING  Incomplete  MRSA PCR Screening     Status: None   Collection Time: 03/08/17  5:17 PM  Result Value Ref Range Status   MRSA by PCR NEGATIVE NEGATIVE Final    Comment:        The GeneXpert MRSA Assay (FDA approved for NASAL specimens only), is one component of a comprehensive MRSA colonization surveillance program. It is not intended to diagnose MRSA infection nor to guide or monitor treatment for MRSA infections.   Surgical pcr screen     Status: None   Collection Time: 03/10/17 10:11 PM  Result Value Ref Range Status   MRSA, PCR NEGATIVE NEGATIVE Final   Staphylococcus aureus NEGATIVE NEGATIVE Final    Comment:        The Xpert SA Assay (FDA approved for NASAL specimens in patients over 54 years of age), is one component of a comprehensive surveillance program.  Test performance has been validated by [facility] for patients greater than or equal to 75 year old. It is not intended to diagnose infection nor to guide or monitor treatment.  Radiology Studies: No results found.   Scheduled Meds: . amLODipine  10 mg Oral QHS  . calcitRIOL  2.25 mcg Oral Q M,W,F-HD  . calcium acetate  1,334 mg Oral TID WC  . chlorhexidine  60 mL Topical Once  . heparin  5,000 Units  Subcutaneous Q8H  . insulin aspart  0-9 Units Subcutaneous TID WC  . insulin glargine  7 Units Subcutaneous QHS  . lanthanum  2,000 mg Oral TID WC  . multivitamin  1 tablet Oral QHS  . nicotine  21 mg Transdermal Daily   Continuous Infusions: . sodium chloride    . sodium chloride    .  ceFAZolin (ANCEF) IV    . piperacillin-tazobactam (ZOSYN)  IV Stopped (03/10/17 2247)  . vancomycin 1,000 mg (03/11/17 0935)     LOS: 3 days   Time Spent in minutes   30 minutes  Aine Strycharz D.O. on 03/11/2017 at 10:33 AM  Between 7am to 7pm - Pager - [phone]  After 7pm go to [email or web address] - password [email or web address]  And look for the night coverage person covering for me after hours  [facility] Office  [phone]

## 2017-03-11 NOTE — Progress Notes (Signed)
Dialysis treatment completed.  2500 mL ultrafiltrated and net fluid removal 2000 mL.    Patient status unchanged. Lung sounds diminished to ausculation in all fields. No edema. Cardiac: NSR.  Disconnected lines and removed needles.  Pressure held for 10 minutes and band aid/gauze dressing applied.  Report given to bedside RN, Ellard Artis.

## 2017-03-11 NOTE — Anesthesia Preprocedure Evaluation (Signed)
Anesthesia Evaluation  Patient identified by MRN, date of birth, ID band Patient awake    Reviewed: Allergy & Precautions, H&P , NPO status , Patient's Chart, lab work & pertinent test results  History of Anesthesia Complications (+) PONV and history of anesthetic complications  Airway Mallampati: II   Neck ROM: full    Dental   Pulmonary Current Smoker,    breath sounds clear to auscultation       Cardiovascular hypertension, + CAD and + Peripheral Vascular Disease   Rhythm:regular Rate:Normal     Neuro/Psych    GI/Hepatic GERD  ,  Endo/Other  diabetes, Insulin Dependent  Renal/GU ESRF and DialysisRenal disease     Musculoskeletal   Abdominal   Peds  Hematology  (+) anemia ,   Anesthesia Other Findings   Reproductive/Obstetrics                             Anesthesia Physical Anesthesia Plan  ASA: III  Anesthesia Plan: General   Post-op Pain Management:    Induction: Intravenous  PONV Risk Score and Plan: 3 and Ondansetron, Dexamethasone, Propofol, Midazolam and Treatment may vary due to age or medical condition  Airway Management Planned: LMA  Additional Equipment:   Intra-op Plan:   Post-operative Plan:   Informed Consent: I have reviewed the patients History and Physical, chart, labs and discussed the procedure including the risks, benefits and alternatives for the proposed anesthesia with the patient or authorized representative who has indicated his/her understanding and acceptance.     Plan Discussed with: CRNA, Anesthesiologist and Surgeon  Anesthesia Plan Comments:         Anesthesia Quick Evaluation

## 2017-03-11 NOTE — Anesthesia Postprocedure Evaluation (Signed)
Anesthesia Post Note  Patient: [name] Hanzlik Sr.  Procedure(s) Performed: Procedure(s) (LRB): LEFT BELOW KNEE AMPUTATION (Left) RIGHT 2ND TOE AMPUTATION (Right)     Patient location during evaluation: PACU Anesthesia Type: General Level of consciousness: awake and alert Pain management: pain level controlled Vital Signs Assessment: post-procedure vital signs reviewed and stable Respiratory status: spontaneous breathing, nonlabored ventilation and respiratory function stable Cardiovascular status: blood pressure returned to baseline and stable Postop Assessment: no signs of nausea or vomiting Anesthetic complications: no    Last Vitals:  Vitals:   03/11/17 1915 03/11/17 1917  BP: (!) 177/83 (!) 172/57  Pulse: 85 83  Resp: (!) 22 17  Temp:  36.5 C    Last Pain:  Vitals:   03/11/17 1915  TempSrc:   PainSc: Asleep                 Mariell Nester,W. EDMOND

## 2017-03-11 NOTE — Op Note (Signed)
   Date of Surgery: 03/11/2017  INDICATIONS: Mr. Shanholtzer is a 41 y.o.-year-old male who has diabetes and end-stage renal disease on dialysis with peripheral vascular disease. Patient has gangrenous changes and dehiscence of transmetatarsal amputation on the left with exposed bone as well as a necrotic right second toe and presents at this time for right second toe 8 dictation and left transtibial amputation. Risks and benefits were discussed including risk of the wounds not healing and potential for higher amputation. Patient states he understands and wishes to proceed at this time.Marland Kitchen  PREOPERATIVE DIAGNOSIS: Dehiscence with osteomyelitis left transmetatarsal amputation with infected right second toe  POSTOPERATIVE DIAGNOSIS: Same.  PROCEDURE: Transtibial amputation left Right foot amputation second toe at the MTP joint Application of Prevena wound VAC left  SURGEON: Sharol Given, M.D.  ANESTHESIA:  general  IV FLUIDS AND URINE: See anesthesia.  ESTIMATED BLOOD LOSS: See anesthesia notes mL.  COMPLICATIONS: None.  DESCRIPTION OF PROCEDURE: The patient was brought to the operating room and underwent a general anesthetic. After adequate levels of anesthesia were obtained patient's lower extremity was prepped using DuraPrep draped into a sterile field. A timeout was called. The foot was draped out of the sterile field with impervious stockinette. A transverse incision was made 11 cm distal to the tibial tubercle. This curved proximally and a large posterior flap was created. The tibia was transected 1 cm proximal to the skin incision. The fibula was transected just proximal to the tibial incision. The tibia was beveled anteriorly. A large posterior flap was created. The sciatic nerve was pulled cut and allowed to retract. The vascular bundles were suture ligated with 2-0 silk. The deep and superficial fascial layers were closed using #1 Vicryl. The skin was closed using staples and 2-0 nylon. The wound was  covered with a Prevena wound VAC. There was a good suction fit. Attention was then focused on the right foot. A fishmouth incision was made just distal to the MTP joint of the right second toe. The toe was amputated through the MTP joint. There is minimal petechial bleeding. The wounds irrigated with normal saline electrocautery was used for hemostasis the incision was closed using 2-0 nylon. A sterile compressive dressing was applied. Patient was extubated taken to the PACU in stable condition.  Meridee Score, MD [place] 4:54 PM

## 2017-03-12 ENCOUNTER — Encounter (HOSPITAL_COMMUNITY): Payer: Self-pay | Admitting: Orthopedic Surgery

## 2017-03-12 DIAGNOSIS — R52 Pain, unspecified: Secondary | ICD-10-CM

## 2017-03-12 LAB — BASIC METABOLIC PANEL
Anion gap: 16 — ABNORMAL HIGH (ref 5–15)
BUN: 30 mg/dL — ABNORMAL HIGH (ref 6–20)
CALCIUM: 8.9 mg/dL (ref 8.9–10.3)
CO2: 23 mmol/L (ref 22–32)
CREATININE: 7.11 mg/dL — AB (ref 0.61–1.24)
Chloride: 92 mmol/L — ABNORMAL LOW (ref 101–111)
GFR calc non Af Amer: 9 mL/min — ABNORMAL LOW (ref >60)
GFR, EST AFRICAN AMERICAN: 10 mL/min — AB (ref >60)
Glucose, Bld: 179 mg/dL — ABNORMAL HIGH (ref 65–99)
Potassium: 4.3 mmol/L (ref 3.5–5.1)
Sodium: 131 mmol/L — ABNORMAL LOW (ref 135–145)

## 2017-03-12 LAB — GLUCOSE, CAPILLARY
GLUCOSE-CAPILLARY: 169 mg/dL — AB (ref 65–99)
GLUCOSE-CAPILLARY: 196 mg/dL — AB (ref 65–99)
GLUCOSE-CAPILLARY: 123 mg/dL — AB (ref 65–99)
Glucose-Capillary: 105 mg/dL — ABNORMAL HIGH (ref 65–99)

## 2017-03-12 LAB — CBC
HEMATOCRIT: 28.7 % — AB (ref 39.0–52.0)
Hemoglobin: 9 g/dL — ABNORMAL LOW (ref 13.0–17.0)
MCH: 23.8 pg — ABNORMAL LOW (ref 26.0–34.0)
MCHC: 31.4 g/dL (ref 30.0–36.0)
MCV: 75.9 fL — ABNORMAL LOW (ref 78.0–100.0)
Platelets: 311 10*3/uL (ref 150–400)
RBC: 3.78 MIL/uL — ABNORMAL LOW (ref 4.22–5.81)
RDW: 22.6 % — AB (ref 11.5–15.5)
WBC: 7.3 10*3/uL (ref 4.0–10.5)

## 2017-03-12 LAB — HEMOGLOBIN A1C
HEMOGLOBIN A1C: 6.1 % — AB (ref 4.8–5.6)
MEAN PLASMA GLUCOSE: 128 mg/dL

## 2017-03-12 MED ORDER — ONDANSETRON HCL 4 MG/2ML IJ SOLN: 4.0000 mg | Freq: Four times a day (QID) | INTRAMUSCULAR | Status: DC | PRN

## 2017-03-12 MED ORDER — NALOXONE HCL 0.4 MG/ML IJ SOLN: 0.4000 mg | INTRAMUSCULAR | Status: DC | PRN

## 2017-03-12 MED ORDER — DIPHENHYDRAMINE HCL 12.5 MG/5ML PO ELIX
12.5000 mg | ORAL_SOLUTION | Freq: Four times a day (QID) | ORAL | Status: DC | PRN
  Administered 2017-03-13 (×2): 12.5 mg via ORAL
  Filled 2017-03-12 (×3): qty 5

## 2017-03-12 MED ORDER — DIPHENHYDRAMINE HCL 50 MG/ML IJ SOLN: 12.5000 mg | Freq: Four times a day (QID) | INTRAMUSCULAR | Status: DC | PRN

## 2017-03-12 MED ORDER — SODIUM CHLORIDE 0.9% FLUSH: 9.0000 mL | INTRAVENOUS | Status: DC | PRN

## 2017-03-12 MED ORDER — HYDROMORPHONE 1 MG/ML IV SOLN
INTRAVENOUS | Status: DC
  Administered 2017-03-12: 0.5 mg via INTRAVENOUS
  Administered 2017-03-12 (×2): 0.6 mg via INTRAVENOUS
  Administered 2017-03-12: 1.2 mg via INTRAVENOUS
  Administered 2017-03-12: 0.5 mg via INTRAVENOUS
  Administered 2017-03-12: 14:00:00 via INTRAVENOUS
  Administered 2017-03-12: 0.5 mg via INTRAVENOUS
  Administered 2017-03-13: 1.8 mg via INTRAVENOUS
  Administered 2017-03-13: 2.4 mg via INTRAVENOUS
  Filled 2017-03-12: qty 25

## 2017-03-12 NOTE — Evaluation (Signed)
Physical Therapy Evaluation Patient Details Name: [name] Kodah Sr. MRN: [id number] DOB: 12-06-1975 Today's Date: 03/12/2017   History of Present Illness  [name] Keim Sr. is a 41 y.o. male with medical history significant for ESRD on HD, last performed on 03/07/2017, hypertension, diabetes, combined systolic and diastolic CHF, PVD, CAD, tobacco abuse, and status post amputation of all toes on L foot and , R and great toe on 3/23 with stump revision on 5/29  by [name], presenting with bilateral foot pain and drainage with foul smelling odor, at the amputation site. Osteomyelitis of the foot/dehiscence of the left metatarsal amputation with gangrenous changes; now s/p L BKA, and R second toe amputation  Clinical Impression   Patient is s/p above surgery resulting in functional limitations due to the deficits listed below (see PT Problem List). Presents with decr functional independence post L BKA and R grt toe amp; Still, strong upper body and has been using crutches pre admission; He is young and very motivated to get better; REcommend CIR to maximize independence and safety with mobility prior to dc home;  Patient will benefit from skilled PT to increase their independence and safety with mobility to allow discharge to the venue listed below.       Follow Up Recommendations CIR    Equipment Recommendations  Rolling walker with 5" wheels;3in1 (PT)    Recommendations for Other Services OT consult     Precautions / Restrictions Precautions Precautions: Fall Precaution Comments: VAC LLE Required Braces or Orthoses: Other Brace/Splint Other Brace/Splint: Post op shoe R foot      Mobility  Bed Mobility Overal bed mobility: Needs Assistance Bed Mobility: Supine to Sit     Supine to sit: Supervision     General bed mobility comments: Used rails; Supervision mostly for management of multiple lines  Transfers Overall transfer level: Needs assistance Equipment used:  Rolling walker (2 wheeled) Transfers: Sit to/from Stand Sit to Stand: Min guard         General transfer comment: Cues for hand placement, and noted he was dependent on weight shifting forward over RW, then switching hands and using UEs to assist in powering up  Ambulation/Gait                Stairs            Wheelchair Mobility    Modified Rankin (Stroke Patients Only)       Balance Overall balance assessment: Needs assistance Sitting-balance support: Feet supported Sitting balance-Leahy Scale: Good     Standing balance support: Bilateral upper extremity supported Standing balance-Leahy Scale: Fair                               Pertinent Vitals/Pain Pain Assessment: 0-10 Pain Score: 7  Pain Location: L residual limb Pain Descriptors / Indicators: Aching;Constant Pain Intervention(s): Monitored during session;Premedicated before session    Home Living Family/patient expects to be discharged to:: Private residence Living Arrangements: Spouse/significant other Available Help at Discharge: Family;Available PRN/intermittently Type of Home: Apartment Home Access: Stairs to enter   Entrance Stairs-Number of Steps:  (to be determined) Home Layout: One level   Additional Comments: Will need to find out more about home setup in ensuing sessions    Prior Function Level of Independence: Independent;Independent with assistive device(s)         Comments: Uses crutches prn     Hand Dominance  Extremity/Trunk Assessment   Upper Extremity Assessment Upper Extremity Assessment: Overall WFL for tasks assessed    Lower Extremity Assessment Lower Extremity Assessment: RLE deficits/detail;LLE deficits/detail RLE Deficits / Details: Surgery site at first ray of foot dressed with large amount of Kerlix; Good strength throughout; denies pain R foot LLE Deficits / Details: Very painful residual limb; unable to fully extend L knee -- at  least 25 degrees away from full extension; evidence of hamstring tightness as well LLE: Unable to fully assess due to pain       Communication   Communication: No difficulties  Cognition Arousal/Alertness: Awake/alert Behavior During Therapy: WFL for tasks assessed/performed Overall Cognitive Status: Within Functional Limits for tasks assessed                                        General Comments General comments (skin integrity, edema, etc.): Able to support self in RW well    Exercises     Assessment/Plan    PT Assessment Patient needs continued PT services  PT Problem List Decreased strength;Decreased range of motion;Decreased activity tolerance;Decreased balance;Decreased mobility;Decreased knowledge of use of DME;Decreased safety awareness;Decreased knowledge of precautions;Pain       PT Treatment Interventions DME instruction;Gait training;Stair training;Functional mobility training;Therapeutic activities;Therapeutic exercise;Balance training;Patient/family education;Wheelchair mobility training    PT Goals (Current goals can be found in the Care Plan section)  Acute Rehab PT Goals Patient Stated Goal: to walk; and to keep his sons PT Goal Formulation: With patient Time For Goal Achievement: 03/26/17 Potential to Achieve Goals: Good    Frequency Min 3X/week   Barriers to discharge        Co-evaluation               AM-PAC PT "6 Clicks" Daily Activity  Outcome Measure Difficulty turning over in bed (including adjusting bedclothes, sheets and blankets)?: None Difficulty moving from lying on back to sitting on the side of the bed? : A Little Difficulty sitting down on and standing up from a chair with arms (e.g., wheelchair, bedside commode, etc,.)?: A Lot Help needed moving to and from a bed to chair (including a wheelchair)?: A Lot Help needed walking in hospital room?: A Lot Help needed climbing 3-5 steps with a railing? : A Lot 6 Click  Score: 15    End of Session   Activity Tolerance: Patient tolerated treatment well Patient left: in bed;with call bell/phone within reach;with nursing/sitter in room Nurse Communication: Mobility status PT Visit Diagnosis: Other abnormalities of gait and mobility (R26.89);Pain Pain - Right/Left: Left Pain - part of body: Leg    Time: 6389-3734 PT Time Calculation (min) (ACUTE ONLY): 20 min   Charges:   PT Evaluation $PT Eval Moderate Complexity: 1 Procedure     PT G Codes:        Roney Marion, PT  Acute Rehabilitation Services Pager [phone] Office [phone]   [place] 03/12/2017, 3:22 PM

## 2017-03-12 NOTE — Progress Notes (Signed)
PROGRESS NOTE    [name] Mcnelly Sr.  PJA:[id number] DOB: 16-Feb-1976 DOA: 03/07/2017 PCP: Patient, [name] Per   Chief Complaint  Patient presents with  . Foot Pain    Brief Narrative:  HPI on 03/08/17 by Ms. [name], PA [name] Mariani Sr. is a 41 y.o. male with medical history significant for ESRD on HD, last performed on 03/07/2017, hypertension, diabetes, combined systolic and diastolic CHF, PVD, CAD, tobacco abuse, and status post amputation of all toes on L foot and , R and great toe on 3/23 with stump revision on 5/29  by [name], presenting with bilateral foot pain and drainage with foul smelling odor, at the amputation site. He reported to ashy discoloration for about one week. Denies any fever chills nausea or vomiting or shortness of breath. He denies any chest pain. Of note, the patient had been on doxycycline for one month, which he felt he was not helping, despite being compliant to the med.  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, will place on dilaudid PCA pump -May consider gabapentin in the future -Patient will need to be assess by PT and OT however, not sure patient will be able to participate today given his pain level   End-stage renal disease -Patient dialyzes MWF -Nephrology consultation appreciated  Nonsustained VTach -Supposedly had 9beat run of VT on the evening of 03/09/17 -Was asymptomatic, potassium and magnesium WNL -No further  events, will discontinue tele monitoring given current pain and agitation   Chronic combined systolic and diastolic heart failure -Echocardiogram 09/05/2014 showed EF of 34-19%, grade 2 diastolic dysfunction -Currently appears to be euvolemic -Continue volume control with hemodialysis  Essential hypertension -Continue amlodipine and IV hydralazine as needed  Diabetes mellitus, type II -Continue Lantus, insulin sliding scale and CBG monitoring -Hemoglobin A1c 6.1  Anemia of chronic disease -Baseline hemoglobin approximately 8-10, currently 9 -Continue to monitor CBC  Tobacco abuse -Smoking cessation discussed -Continue nicotine patch   Itchiness -possibly related to ESRD as patient was scratching today upon examination in HD and was receiving no antibiotics -Continue sarna PRN  DVT Prophylaxis  heparin  Code Status: Full  Family Communication: None at bedside  Disposition Plan: Admitted. Pending surgery today. Will likely need SNF  Consultants Orthopedic surgery Nephrology  Procedures  None  Antibiotics   Anti-infectives    Start     Dose/Rate Route Frequency Ordered Stop   03/11/17 1300  ceFAZolin (ANCEF) IVPB 2g/100 mL premix     2 g 200 mL/hr over 30 Minutes Intravenous To ShortStay Surgical 03/10/17 2148 03/11/17 1614   03/09/17 1200  vancomycin (VANCOCIN) IVPB 1000 mg/200 mL premix     1,000 mg 200 mL/hr over 60 Minutes Intravenous Every M-W-F (Hemodialysis) 03/08/17 0115     03/08/17 0130  vancomycin (VANCOCIN) 2,000 mg in sodium chloride 0.9 % 500 mL IVPB     2,000 mg 250 mL/hr over 120 Minutes Intravenous  Once 03/08/17 0115 03/08/17 0343   03/08/17 0130  piperacillin-tazobactam (ZOSYN) IVPB 3.375 g  3.375 g 12.5 mL/hr over 240 Minutes Intravenous Every 12 hours 03/08/17 0115     03/08/17 0015  vancomycin (VANCOCIN) IVPB 1000 mg/200 mL premix  Status:  Discontinued     1,000 mg 200 mL/hr over 60 Minutes Intravenous  Once 03/08/17 0001 03/08/17 0111     03/08/17 0015  piperacillin-tazobactam (ZOSYN) IVPB 3.375 g  Status:  Discontinued     3.375 g 100 mL/hr over 30 Minutes Intravenous  Once 03/08/17 0001 03/08/17 0111      Subjective:   [name] General seen and examined today. Complains of pain at recent surgical sites, cannot quantify his pain (currently in fetal position, rocking back and forth). Feels pain medications wear off too quickly. Denies chest pain, shortness of breath, abdominal pain, dizziness, headache.   Objective:   Vitals:   03/11/17 1930 03/11/17 2314 03/12/17 0418 03/12/17 0900  BP: (!) 153/58 (!) 182/86 (!) 177/84 (!) 141/77  Pulse: 84 98 100 96  Resp: 16  18 16   Temp:  99.8 F (37.7 C) 98.6 F (37 C) 98.9 F (37.2 C)  TempSrc:  Oral Oral Oral  SpO2: 95% 95% 99% 93%  Weight:  80.7 kg (178 lb)    Height:        Intake/Output Summary (Last 24 hours) at 03/12/17 1237 Last data filed at 03/12/17 0900  Gross per 24 hour  Intake           2107.5 ml  Output              225 ml  Net           1882.5 ml   Filed Weights   03/11/17 0636 03/11/17 1021 03/11/17 2314  Weight: 83.8 kg (184 lb 11.9 oz) 81.8 kg (180 lb 5.4 oz) 80.7 kg (178 lb)   Exam  General: Well developed, well nourished, in distress due to pain (in tears).  HEENT: NCAT, mucous membranes moist.   Cardiovascular: S1 S2 auscultated, 2/6SEM, RRR  Respiratory: Clear to auscultation bilaterally with equal chest rise  Abdomen: Soft, nontender, nondistended, + bowel sounds  Extremities: Left BKA with wound vac in place. Right foot in dressing.   Neuro: AAOx3, nonfocal  Psych: Appropriate  Data Reviewed: I have personally reviewed following labs and imaging studies  CBC:  Recent Labs Lab 03/07/17 2103 03/08/17 0958 03/09/17 0719 03/10/17 0502 03/11/17 0359 03/12/17 0356  WBC 5.9 5.4 6.7 7.5 7.1 7.3  NEUTROABS 3.8  --   --   --   --   --   HGB 11.1* 10.1* 10.4* 10.4* 10.2* 9.0*  HCT 34.0* 32.3* 32.5* 31.7* 32.4* 28.7*  MCV  73.8* 74.9* 75.4* 75.3* 74.1* 75.9*  PLT 233 212 227 235 257 627   Basic Metabolic Panel:  Recent Labs Lab 03/07/17 2103 03/08/17 0958 03/09/17 0719 03/10/17 0502 03/10/17 1110 03/11/17 0359 03/12/17 0356  NA 134*  --  134* 133*  --  132* 131*  K 3.5  --  4.2 3.6  --  4.1 4.3  CL 93*  --  94* 92*  --  92* 92*  CO2 23  --  23 26  --  25 23  GLUCOSE 103*  --  70 63*  --  136* 179*  BUN 40*  --  54* 31*  --  46* 30*  CREATININE 10.64* 10.96* 12.38* 8.13*  --  9.63* 7.11*  CALCIUM 8.6*  --  8.8* 9.2  --  9.0 8.9  MG  --   --   --   --  2.3  --   --   PHOS  --   --  8.2*  --   --   --   --    GFR: Estimated Creatinine Clearance: 15.8 mL/min (A) (by C-G formula based on SCr of 7.11 mg/dL (H)). Liver Function Tests:  Recent Labs Lab 03/07/17 2103 03/09/17 0719  AST 25  --   ALT 14*  --   ALKPHOS 137*  --   BILITOT 1.5*  --   PROT 7.1  --   ALBUMIN 3.5 3.2*   No results for input(s): LIPASE, AMYLASE in the last 168 hours. No results for input(s): AMMONIA in the last 168 hours. Coagulation Profile: No results for input(s): INR, PROTIME in the last 168 hours. Cardiac Enzymes: No results for input(s): CKTOTAL, CKMB, CKMBINDEX, TROPONINI in the last 168 hours. BNP (last 3 results) No results for input(s): PROBNP in the last 8760 hours. HbA1C:  Recent Labs  03/11/17 1122  HGBA1C 6.1*   CBG:  Recent Labs Lab 03/11/17 1318 03/11/17 1418 03/11/17 1722 03/11/17 1953 03/12/17 0743  GLUCAP 82 83 83 136* 169*   Lipid Profile: No results for input(s): CHOL, HDL, LDLCALC, TRIG, CHOLHDL, LDLDIRECT in the last 72 hours. Thyroid Function Tests: No results for input(s): TSH, T4TOTAL, FREET4, T3FREE, THYROIDAB in the last 72 hours. Anemia Panel: No results for input(s): VITAMINB12, FOLATE, FERRITIN, TIBC, IRON, RETICCTPCT in the last 72 hours. Urine analysis:    Component Value Date/Time   COLORURINE RED BIOCHEMICALS MAY BE AFFECTED BY COLOR (A) 01/29/2010 0939    APPEARANCEUR CLOUDY (A) 01/29/2010 0939   LABSPEC 1.020 01/29/2010 0939   PHURINE 8.0 01/29/2010 0939   GLUCOSEU NEGATIVE 01/29/2010 0939   HGBUR LARGE (A) 01/29/2010 0939   BILIRUBINUR MODERATE (A) 01/29/2010 0939   KETONESUR 15 (A) 01/29/2010 0939   PROTEINUR >300 (A) 01/29/2010 0939   UROBILINOGEN 0.2 01/29/2010 0939   NITRITE POSITIVE (A) 01/29/2010 0939   LEUKOCYTESUR LARGE (A) 01/29/2010 0939   Sepsis Labs: @LABRCNTIP (procalcitonin:4,lacticidven:4)  ) Recent Results (from the past 240 hour(s))  Blood culture (routine x 2)     Status: None (Preliminary result)   Collection Time: 03/07/17 10:58 PM  Result Value Ref Range Status   Specimen Description BLOOD RIGHT FOREARM  Final   Special Requests IN PEDIATRIC BOTTLE Blood Culture adequate volume  Final   Culture NO GROWTH 3 DAYS  Final   Report Status PENDING  Incomplete  Blood culture (routine x 2)     Status: None (Preliminary result)   Collection Time: 03/07/17 11:04 PM  Result Value Ref Range Status   Specimen Description BLOOD RIGHT HAND  Final   Special Requests   Final    BOTTLES DRAWN AEROBIC AND ANAEROBIC Blood Culture adequate volume   Culture NO GROWTH 3 DAYS  Final   Report Status PENDING  Incomplete  MRSA PCR Screening     Status: None   Collection Time: 03/08/17  5:17 PM  Result Value Ref Range Status   MRSA by PCR NEGATIVE NEGATIVE Final    Comment:        The GeneXpert MRSA Assay (FDA approved for NASAL specimens only), is one component of a comprehensive MRSA colonization surveillance program. It is not intended to diagnose MRSA infection nor to guide or monitor treatment for MRSA infections.   Surgical pcr screen     Status: None   Collection Time: 03/10/17 10:11 PM  Result Value Ref Range Status   MRSA, PCR NEGATIVE NEGATIVE Final  Staphylococcus aureus NEGATIVE NEGATIVE Final    Comment:        The Xpert SA Assay (FDA approved for NASAL specimens in patients over 71 years of age), is one  component of a comprehensive surveillance program.  Test performance has been validated by [facility] for patients greater than or equal to 35 year old. It is not intended to diagnose infection nor to guide or monitor treatment.       Radiology Studies: No results found.   Scheduled Meds: . amLODipine  10 mg Oral QHS  . calcitRIOL  2.25 mcg Oral Q M,W,F-HD  . calcium acetate  1,334 mg Oral TID WC  . docusate sodium  100 mg Oral BID  . heparin  5,000 Units Subcutaneous Q8H  . HYDROmorphone   Intravenous Q4H  . insulin aspart  0-9 Units Subcutaneous TID WC  . insulin glargine  7 Units Subcutaneous QHS  . lanthanum  2,000 mg Oral TID WC  . multivitamin  1 tablet Oral QHS  . nicotine  21 mg Transdermal Daily   Continuous Infusions: . sodium chloride 10 mL/hr at 03/11/17 1440  . sodium chloride 10 mL/hr at 03/11/17 2218  . methocarbamol (ROBAXIN)  IV 500 mg (03/11/17 1759)  . piperacillin-tazobactam (ZOSYN)  IV 3.375 g (03/12/17 0603)  . vancomycin Stopped (03/11/17 1035)     LOS: 4 days   Time Spent in minutes   30 minutes  Audria Takeshita D.O. on 03/12/2017 at 12:37 PM  Between 7am to 7pm - Pager - [phone]  After 7pm go to [email or web address] - password [email or web address]  And look for the night coverage person covering for me after hours  [facility] Office  [phone]

## 2017-03-12 NOTE — Progress Notes (Signed)
Subjective: Patient stable.  Reports pain in left BKA stump.  Wound VAC is functional.  Mild drainage from the right foot dressing from toe amputation   Objective: Vital signs in last 24 hours: Temp:  [97.7 F (36.5 C)-99.8 F (37.7 C)] 98.6 F (37 C) (07/28 0418) Pulse Rate:  [82-116] 100 (07/28 0418) Resp:  [11-27] 18 (07/28 0418) BP: (147-215)/(57-100) 177/84 (07/28 0418) SpO2:  [86 %-100 %] 99 % (07/28 0418) Weight:  [178 lb (80.7 kg)-180 lb 5.4 oz (81.8 kg)] 178 lb (80.7 kg) (07/27 2314)  Intake/Output from previous day: 07/27 0701 - 07/28 0700 In: 1867.5 [P.O.:790; I.V.:1077.5] Out: 2225 [Drains:25; Blood:200] Intake/Output this shift: No intake/output data recorded.  Exam:  No cellulitis present  Labs:  Recent Labs  03/10/17 0502 03/11/17 0359 03/12/17 0356  HGB 10.4* 10.2* 9.0*    Recent Labs  03/11/17 0359 03/12/17 0356  WBC 7.1 7.3  RBC 4.37 3.78*  HCT 32.4* 28.7*  PLT 257 311    Recent Labs  03/11/17 0359 03/12/17 0356  NA 132* 131*  K 4.1 4.3  CL 92* 92*  CO2 25 23  BUN 46* 30*  CREATININE 9.63* 7.11*  GLUCOSE 136* 179*  CALCIUM 9.0 8.9   No results for input(s): LABPT, INR in the last 72 hours.  Assessment/Plan: Plan at this time is for fracture boot so he can weight-bear on the heel on that right lower extremity.  Continue wound VAC on the left leg   [name] 03/12/2017, 8:41 AM

## 2017-03-12 NOTE — Progress Notes (Signed)
Patient has been caught several times this shift messing with wound vac dressing.This nurse has instructed patient each time to leave wound vac dressing alone.

## 2017-03-12 NOTE — Progress Notes (Signed)
Orthopedic Tech Progress Note Patient Details:  [name] Stangl Sr. [birth year] [id number]  Ortho Devices Type of Ortho Device: Postop shoe/boot Ortho Device/Splint Interventions: Application   [place] Pink 03/12/2017, 12:27 PM

## 2017-03-12 NOTE — Progress Notes (Signed)
[facility] Progress Note  Subjective: severe post-op pain, writhing in the bed   Objective Vitals:   03/11/17 1930 03/11/17 2314 03/12/17 0418 03/12/17 0900  BP: (!) 153/58 (!) 182/86 (!) 177/84 (!) 141/77  Pulse: 84 98 100 96  Resp: 16  18 16   Temp:  99.8 F (37.7 C) 98.6 F (37 C) 98.9 F (37.2 C)  TempSrc:  Oral Oral Oral  SpO2: 95% 95% 99% 93%  Weight:  80.7 kg (178 lb)    Height:       Physical Exam General: writhing in pain Heart: RRR  Lungs: CTAB Abdomen: soft NT  Extremities: L BKA w/ VAC in place; R foot wrapped Dialysis Access: LUE AVF   Dialysis Orders: [facility] MWF  4h   500/800  2/2 bath  P2   80.5kg  LUA AVF  Hep 8000 Venofer 100 mg IV x10 (until 8/10) Calcitriol 2.25 mcg PO q HD BMM:  Fosrenol 1000 2 tabs ti/ Ca acetate 3 tabs tid   Assessment: 1.  Gangrene - sp L BKA and R 2nd toe amp 03/11/17; severe pain, will start PCA dilaudid for 1-2 days, have d/w primary 2.  ESRD -  MWF HD 3.  Hypertension/volume  - stable, at dry wt 4.  Anemia  - Hgb 11.1 No ESA needs currently/Hold Fe with infection  5.  Metabolic bone disease -  Continue VDRA/binders Follow P with renal panel  6.  Nutrition - NPO presently Renal diet/vitamins when diet resumes  7.  DM - per primary   Plan - as above    Kelly Splinter MD [facility] pager [phone]   03/12/2017, 1:07 PM  Additional Objective Labs: Basic Metabolic Panel:  Recent Labs Lab 03/09/17 0719 03/10/17 0502 03/11/17 0359 03/12/17 0356  NA 134* 133* 132* 131*  K 4.2 3.6 4.1 4.3  CL 94* 92* 92* 92*  CO2 23 26 25 23   GLUCOSE 70 63* 136* 179*  BUN 54* 31* 46* 30*  CREATININE 12.38* 8.13* 9.63* 7.11*  CALCIUM 8.8* 9.2 9.0 8.9  PHOS 8.2*  --   --   --    Liver Function Tests:  Recent Labs Lab 03/07/17 2103 03/09/17 0719  AST 25  --   ALT 14*  --   ALKPHOS 137*  --   BILITOT 1.5*  --   PROT 7.1  --   ALBUMIN 3.5 3.2*   No results for input(s): LIPASE, AMYLASE in  the last 168 hours. CBC:  Recent Labs Lab 03/07/17 2103 03/08/17 0958 03/09/17 0719 03/10/17 0502 03/11/17 0359 03/12/17 0356  WBC 5.9 5.4 6.7 7.5 7.1 7.3  NEUTROABS 3.8  --   --   --   --   --   HGB 11.1* 10.1* 10.4* 10.4* 10.2* 9.0*  HCT 34.0* 32.3* 32.5* 31.7* 32.4* 28.7*  MCV 73.8* 74.9* 75.4* 75.3* 74.1* 75.9*  PLT 233 212 227 235 257 311   Blood Culture    Component Value Date/Time   SDES BLOOD RIGHT HAND 03/07/2017 2304   SPECREQUEST  03/07/2017 2304    BOTTLES DRAWN AEROBIC AND ANAEROBIC Blood Culture adequate volume   CULT NO GROWTH 3 DAYS 03/07/2017 2304   REPTSTATUS PENDING 03/07/2017 2304    Cardiac Enzymes: No results for input(s): CKTOTAL, CKMB, CKMBINDEX, TROPONINI in the last 168 hours. CBG:  Recent Labs Lab 03/11/17 1418 03/11/17 1722 03/11/17 1953 03/12/17 0743 03/12/17 1240  GLUCAP 83 83 136* 169* 105*   Iron Studies: No results for  input(s): IRON, TIBC, TRANSFERRIN, FERRITIN in the last 72 hours. Lab Results  Component Value Date   INR 1.05 09/13/2014   INR 1.05 09/27/2013   Medications: . sodium chloride 10 mL/hr at 03/11/17 1440  . sodium chloride 10 mL/hr at 03/11/17 2218  . methocarbamol (ROBAXIN)  IV 500 mg (03/11/17 1759)  . piperacillin-tazobactam (ZOSYN)  IV 3.375 g (03/12/17 0603)   . amLODipine  10 mg Oral QHS  . calcitRIOL  2.25 mcg Oral Q M,W,F-HD  . calcium acetate  1,334 mg Oral TID WC  . docusate sodium  100 mg Oral BID  . heparin  5,000 Units Subcutaneous Q8H  . HYDROmorphone   Intravenous Q4H  . insulin aspart  0-9 Units Subcutaneous TID WC  . insulin glargine  7 Units Subcutaneous QHS  . lanthanum  2,000 mg Oral TID WC  . multivitamin  1 tablet Oral QHS  . nicotine  21 mg Transdermal Daily

## 2017-03-13 DIAGNOSIS — Z720 Tobacco use: Secondary | ICD-10-CM

## 2017-03-13 DIAGNOSIS — T8781 Dehiscence of amputation stump: Secondary | ICD-10-CM

## 2017-03-13 DIAGNOSIS — D62 Acute posthemorrhagic anemia: Secondary | ICD-10-CM

## 2017-03-13 DIAGNOSIS — Z89512 Acquired absence of left leg below knee: Secondary | ICD-10-CM

## 2017-03-13 DIAGNOSIS — E1022 Type 1 diabetes mellitus with diabetic chronic kidney disease: Secondary | ICD-10-CM

## 2017-03-13 DIAGNOSIS — Z992 Dependence on renal dialysis: Secondary | ICD-10-CM

## 2017-03-13 DIAGNOSIS — Z89432 Acquired absence of left foot: Secondary | ICD-10-CM

## 2017-03-13 DIAGNOSIS — Z89511 Acquired absence of right leg below knee: Secondary | ICD-10-CM

## 2017-03-13 DIAGNOSIS — I25110 Atherosclerotic heart disease of native coronary artery with unstable angina pectoris: Secondary | ICD-10-CM

## 2017-03-13 DIAGNOSIS — N186 End stage renal disease: Secondary | ICD-10-CM

## 2017-03-13 DIAGNOSIS — D631 Anemia in chronic kidney disease: Secondary | ICD-10-CM

## 2017-03-13 DIAGNOSIS — I5042 Chronic combined systolic (congestive) and diastolic (congestive) heart failure: Secondary | ICD-10-CM

## 2017-03-13 DIAGNOSIS — I739 Peripheral vascular disease, unspecified: Secondary | ICD-10-CM

## 2017-03-13 DIAGNOSIS — I10 Essential (primary) hypertension: Secondary | ICD-10-CM

## 2017-03-13 DIAGNOSIS — E118 Type 2 diabetes mellitus with unspecified complications: Secondary | ICD-10-CM

## 2017-03-13 DIAGNOSIS — I2510 Atherosclerotic heart disease of native coronary artery without angina pectoris: Secondary | ICD-10-CM

## 2017-03-13 DIAGNOSIS — G8918 Other acute postprocedural pain: Secondary | ICD-10-CM

## 2017-03-13 DIAGNOSIS — I5020 Unspecified systolic (congestive) heart failure: Secondary | ICD-10-CM

## 2017-03-13 LAB — BASIC METABOLIC PANEL
ANION GAP: 15 (ref 5–15)
BUN: 42 mg/dL — ABNORMAL HIGH (ref 6–20)
CALCIUM: 8.9 mg/dL (ref 8.9–10.3)
CO2: 23 mmol/L (ref 22–32)
Chloride: 88 mmol/L — ABNORMAL LOW (ref 101–111)
Creatinine, Ser: 8.4 mg/dL — ABNORMAL HIGH (ref 0.61–1.24)
GFR, EST AFRICAN AMERICAN: 8 mL/min — AB (ref >60)
GFR, EST NON AFRICAN AMERICAN: 7 mL/min — AB (ref >60)
GLUCOSE: 146 mg/dL — AB (ref 65–99)
POTASSIUM: 4.5 mmol/L (ref 3.5–5.1)
SODIUM: 126 mmol/L — AB (ref 135–145)

## 2017-03-13 LAB — CULTURE, BLOOD (ROUTINE X 2)
CULTURE: NO GROWTH
CULTURE: NO GROWTH
SPECIAL REQUESTS: ADEQUATE
Special Requests: ADEQUATE

## 2017-03-13 LAB — CBC
HEMATOCRIT: 26.1 % — AB (ref 39.0–52.0)
Hemoglobin: 8.4 g/dL — ABNORMAL LOW (ref 13.0–17.0)
MCH: 23.7 pg — ABNORMAL LOW (ref 26.0–34.0)
MCHC: 32.2 g/dL (ref 30.0–36.0)
MCV: 73.7 fL — AB (ref 78.0–100.0)
PLATELETS: 297 10*3/uL (ref 150–400)
RBC: 3.54 MIL/uL — AB (ref 4.22–5.81)
RDW: 21.4 % — AB (ref 11.5–15.5)
WBC: 8.5 10*3/uL (ref 4.0–10.5)

## 2017-03-13 LAB — GLUCOSE, CAPILLARY
GLUCOSE-CAPILLARY: 114 mg/dL — AB (ref 65–99)
GLUCOSE-CAPILLARY: 108 mg/dL — AB (ref 65–99)
GLUCOSE-CAPILLARY: 158 mg/dL — AB (ref 65–99)
Glucose-Capillary: 153 mg/dL — ABNORMAL HIGH (ref 65–99)

## 2017-03-13 MED ORDER — MORPHINE SULFATE ER 15 MG PO TBCR
15.0000 mg | EXTENDED_RELEASE_TABLET | Freq: Two times a day (BID) | ORAL | Status: DC
  Administered 2017-03-13 (×2): 15 mg via ORAL
  Filled 2017-03-13 (×2): qty 1

## 2017-03-13 MED ORDER — POLYETHYLENE GLYCOL 3350 17 G PO PACK
17.0000 g | PACK | Freq: Every day | ORAL | Status: DC
  Filled 2017-03-13 (×2): qty 1

## 2017-03-13 MED ORDER — DARBEPOETIN ALFA 100 MCG/0.5ML IJ SOSY
100.0000 ug | PREFILLED_SYRINGE | INTRAMUSCULAR | Status: DC
  Administered 2017-03-14: 100 ug via INTRAVENOUS

## 2017-03-13 MED ORDER — MORPHINE SULFATE (PF) 2 MG/ML IV SOLN
2.0000 mg | INTRAVENOUS | Status: DC | PRN
  Administered 2017-03-13 – 2017-03-17 (×10): 2 mg via INTRAVENOUS
  Filled 2017-03-13 (×9): qty 1

## 2017-03-13 NOTE — Care Management Note (Signed)
Case Management Note Marvetta Gibbons RN, BSN Unit 4E-Case Manager-- 6E coverage [phone]  Patient Details  Name: [name] [facility] Sr. MRN: [id number] Date of Birth: 04/09/1976  Subjective/Objective:    Pt admitted with osteomyelitis of foot with dehiscence of left metatarsal amputation- s/p left BKA on  03/11/17 with Prevena Wound VAC application               Action/Plan: PTA pt lived at home with spouse- will have family available PRN at discharge, per PT eval recommendation for CIR, CIR consult has been placed- CM will follow up once CIR has seen for d/c plans.   Expected Discharge Date:                  Expected Discharge Plan:  IP Rehab Facility  In-House Referral:  Clinical Social Work  Discharge planning Services  CM Consult  Post Acute Care Choice:    Choice offered to:     DME Arranged:    DME Agency:     HH Arranged:    [place] Agency:     Status of Service:  In process, will continue to follow  If discussed at Long Length of Stay Meetings, dates discussed:    Discharge Disposition:   Additional Comments:  [name], RN 03/13/2017, 9:19 AM
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
## 2017-03-13 NOTE — Progress Notes (Signed)
PCA Dilaudid stopped per MD order.  10 mL wasted in sink witnessed by Cassell Smiles, RN and Durward Mallard, RN.

## 2017-03-13 NOTE — Progress Notes (Signed)
Patient was out of bed yesterday.  Still describes swelling in the left BKA stump.  Wound VAC is functional and in position.

## 2017-03-13 NOTE — Progress Notes (Signed)
PROGRESS NOTE    [name] Hammack Sr.  WGN:[id number] DOB: 14-Oct-1975 DOA: 03/07/2017 PCP: Patient, [name] Per   Chief Complaint  Patient presents with  . Foot Pain    Brief Narrative:  HPI on 03/08/17 by Ms. [name], PA [name] Louvier Sr. is a 41 y.o. male with medical history significant for ESRD on HD, last performed on 03/07/2017, hypertension, diabetes, combined systolic and diastolic CHF, PVD, CAD, tobacco abuse, and status post amputation of all toes on L foot and , R and great toe on 3/23 with stump revision on 5/29  by [name], presenting with bilateral foot pain and drainage with foul smelling odor, at the amputation site. He reported to ashy discoloration for about one week. Denies any fever chills nausea or vomiting or shortness of breath. He denies any chest pain. Of note, the patient had been on doxycycline for one month, which he felt he was not helping, despite being compliant to the med.  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, and patient was placed on Dilaudid PCA pump -PT assessed patient and recommended CIR -Inpatient rehab consulted and appreciated -Will attempt to transition to oral pain control with morphine for break through. Will add on MS contin 15mg  BID scheduled and discontinue PCA pump. -Given narcotic use, will schedule miralax  End-stage renal disease -Patient dialyzes MWF -Nephrology consultation appreciated  Nonsustained  VTach -Supposedly had 9beat run of VT on the evening of 03/09/17 -Was asymptomatic, potassium and magnesium WNL -No further events  Chronic combined systolic and diastolic heart failure -Echocardiogram 09/05/2014 showed EF of 78-46%, grade 2 diastolic dysfunction -Currently appears to be euvolemic -Continue volume control with hemodialysis  Essential hypertension -Continue amlodipine and IV hydralazine as needed  Diabetes mellitus, type II -Continue Lantus, insulin sliding scale and CBG monitoring -Hemoglobin A1c 6.1  Anemia of chronic disease -Baseline hemoglobin approximately 8-10, currently 8.4 -Continue to monitor CBC  Tobacco abuse -Smoking cessation discussed -Continue nicotine patch   Itchiness -possibly related to ESRD as patient was scratching today upon examination in HD and was receiving no antibiotics -Continue sarna PRN  DVT Prophylaxis  heparin  Code Status: Full  Family Communication: None at bedside  Disposition Plan: Admitted. Will transition to oral pain control today. Will need CIR vs SNF.  Consultants Orthopedic surgery Nephrology Inpatient rehab  Procedures  Left BKA Right 2nd toe amputation   Antibiotics   Anti-infectives    Start     Dose/Rate Route Frequency Ordered Stop   03/11/17 1300  ceFAZolin (ANCEF) IVPB 2g/100 mL premix     2 g 200 mL/hr over 30 Minutes Intravenous To ShortStay Surgical 03/10/17 2148 03/11/17 1614   03/09/17 1200  vancomycin (VANCOCIN) IVPB 1000 mg/200 mL premix  Status:  Discontinued     1,000 mg 200 mL/hr over 60 Minutes Intravenous Every M-W-F (Hemodialysis) 03/08/17 0115 03/12/17 1243   03/08/17 0130  vancomycin (VANCOCIN) 2,000 mg in sodium chloride 0.9 % 500 mL IVPB     2,000 mg 250 mL/hr over  120 Minutes Intravenous  Once 03/08/17 0115 03/08/17 0343   03/08/17 0130  piperacillin-tazobactam (ZOSYN) IVPB 3.375 g     3.375 g 12.5 mL/hr over 240 Minutes Intravenous Every 12 hours 03/08/17 0115 03/13/17 2359    03/08/17 0015  vancomycin (VANCOCIN) IVPB 1000 mg/200 mL premix  Status:  Discontinued     1,000 mg 200 mL/hr over 60 Minutes Intravenous  Once 03/08/17 0001 03/08/17 0111   03/08/17 0015  piperacillin-tazobactam (ZOSYN) IVPB 3.375 g  Status:  Discontinued     3.375 g 100 mL/hr over 30 Minutes Intravenous  Once 03/08/17 0001 03/08/17 0111      Subjective:   [name] seen and examined today. Feels pain is better controlled. However feels morphine is better than Dilaudid. Complains of problems with wound VAC overnight. Feels his stump is mildly swollen. Currently denies chest pain, shortness of breath, abdominal pain, nausea or vomiting, diarrhea or constipation.  Objective:   Vitals:   03/13/17 0400 03/13/17 0634 03/13/17 0800 03/13/17 0900  BP:  (!) 158/95  (!) 167/94  Pulse:  95  (!) 103  Resp: 16 17 12 18   Temp:  99.3 F (37.4 C)  98.5 F (36.9 C)  TempSrc:  Oral  Oral  SpO2: 99% 100% 96% 99%  Weight:      Height:        Intake/Output Summary (Last 24 hours) at 03/13/17 1057 Last data filed at 03/13/17 0926  Gross per 24 hour  Intake             1532 ml  Output             1000 ml  Net              532 ml   Filed Weights   03/11/17 1021 03/11/17 2314 03/12/17 2103  Weight: 81.8 kg (180 lb 5.4 oz) 80.7 kg (178 lb) 83.9 kg (185 lb)   Exam  General: Well developed, well nourished, NAD  HEENT: NCAT, mucous membranes moist.   Cardiovascular: S1 S2 auscultated, 2/6SEM, RRR  Respiratory: Clear to auscultation bilaterally with equal chest rise, no cough/wheeze   Abdomen: Soft, nontender, nondistended, + bowel sounds  Extremities: left BKA with wound vac in place. Right foot dressing in place  Neuro: AAOx3, nonfocal  Psych: Appropriate mood and affect  Data Reviewed: I have personally reviewed following labs and imaging studies  CBC:  Recent Labs Lab 03/07/17 2103  03/09/17 0719 03/10/17 0502 03/11/17 0359 03/12/17 0356 03/13/17 0611  WBC 5.9   < > 6.7 7.5 7.1 7.3 8.5  NEUTROABS 3.8  --   --   --   --   --   --   HGB 11.1*  < > 10.4* 10.4* 10.2* 9.0* 8.4*  HCT 34.0*  < > 32.5* 31.7* 32.4* 28.7* 26.1*  MCV 73.8*  < > 75.4* 75.3* 74.1* 75.9* 73.7*  PLT 233  < > 227 235 257 311 297  < > = values in this interval not displayed. Basic Metabolic Panel:  Recent Labs Lab 03/09/17 0719 03/10/17 0502 03/10/17 1110 03/11/17 0359 03/12/17 0356 03/13/17 0611  NA 134* 133*  --  132* 131* 126*  K 4.2 3.6  --  4.1 4.3 4.5  CL 94* 92*  --  92* 92* 88*  CO2 23 26  --  25 23 23   GLUCOSE 70 63*  --  136* 179* 146*  BUN 54* 31*  --  46* 30* 42*  CREATININE 12.38* 8.13*  --  9.63* 7.11* 8.40*  CALCIUM 8.8* 9.2  --  9.0 8.9 8.9  MG  --   --  2.3  --   --   --   PHOS 8.2*  --   --   --   --   --    GFR: Estimated Creatinine Clearance: 13.6 mL/min (A) (by C-G formula based on SCr of 8.4 mg/dL (H)). Liver Function Tests:  Recent Labs Lab 03/07/17 2103 03/09/17 0719  AST 25  --   ALT 14*  --   ALKPHOS 137*  --   BILITOT 1.5*  --   PROT 7.1  --   ALBUMIN 3.5 3.2*   No results for input(s): LIPASE, AMYLASE in the last 168 hours. No results for input(s): AMMONIA in the last 168 hours. Coagulation Profile: No results for input(s): INR, PROTIME in the last 168 hours. Cardiac Enzymes: No results for input(s): CKTOTAL, CKMB, CKMBINDEX, TROPONINI in the last 168 hours. BNP (last 3 results) No results for input(s): PROBNP in the last 8760 hours. HbA1C:  Recent Labs  03/11/17 1122  HGBA1C 6.1*   CBG:  Recent Labs Lab 03/12/17 0743 03/12/17 1240 03/12/17 1734 03/12/17 2112 03/13/17 0743  GLUCAP 169* 105* 196* 123* 114*   Lipid Profile: No results for input(s): CHOL, HDL, LDLCALC, TRIG, CHOLHDL, LDLDIRECT in the last 72 hours. Thyroid Function Tests: No results for input(s): TSH, T4TOTAL, FREET4, T3FREE, THYROIDAB in the last 72 hours. Anemia Panel: No results for input(s): VITAMINB12, FOLATE, FERRITIN, TIBC, IRON,  RETICCTPCT in the last 72 hours. Urine analysis:    Component Value Date/Time   COLORURINE RED BIOCHEMICALS MAY BE AFFECTED BY COLOR (A) 01/29/2010 0939   APPEARANCEUR CLOUDY (A) 01/29/2010 0939   LABSPEC 1.020 01/29/2010 0939   PHURINE 8.0 01/29/2010 0939   GLUCOSEU NEGATIVE 01/29/2010 0939   HGBUR LARGE (A) 01/29/2010 0939   BILIRUBINUR MODERATE (A) 01/29/2010 0939   KETONESUR 15 (A) 01/29/2010 0939   PROTEINUR >300 (A) 01/29/2010 0939   UROBILINOGEN 0.2 01/29/2010 0939   NITRITE POSITIVE (A) 01/29/2010 0939   LEUKOCYTESUR LARGE (A) 01/29/2010 0939   Sepsis Labs: @LABRCNTIP (procalcitonin:4,lacticidven:4)  ) Recent Results (from the past 240 hour(s))  Blood culture (routine x 2)     Status: None (Preliminary result)   Collection Time: 03/07/17 10:58 PM  Result Value Ref Range Status   Specimen Description BLOOD RIGHT FOREARM  Final   Special Requests IN PEDIATRIC BOTTLE Blood Culture adequate volume  Final   Culture NO GROWTH 4 DAYS  Final   Report Status PENDING  Incomplete  Blood culture (routine x 2)     Status: None (Preliminary result)   Collection Time: 03/07/17 11:04 PM  Result Value Ref Range Status   Specimen Description BLOOD RIGHT HAND  Final   Special Requests   Final    BOTTLES DRAWN AEROBIC AND ANAEROBIC Blood Culture adequate volume   Culture NO GROWTH 4 DAYS  Final   Report Status PENDING  Incomplete  MRSA PCR Screening     Status: None   Collection Time: 03/08/17  5:17 PM  Result Value Ref Range Status   MRSA by PCR NEGATIVE NEGATIVE Final    Comment:        The GeneXpert MRSA Assay (FDA approved for NASAL specimens only), is one component of a comprehensive MRSA colonization surveillance program. It is not intended to diagnose MRSA infection nor to guide or monitor treatment for MRSA infections.   Surgical pcr screen     Status:  None   Collection Time: 03/10/17 10:11 PM  Result Value Ref Range Status   MRSA, PCR NEGATIVE NEGATIVE Final    Staphylococcus aureus NEGATIVE NEGATIVE Final    Comment:        The Xpert SA Assay (FDA approved for NASAL specimens in patients over 74 years of age), is one component of a comprehensive surveillance program.  Test performance has been validated by [facility] for patients greater than or equal to 4 year old. It is not intended to diagnose infection nor to guide or monitor treatment.       Radiology Studies: No results found.   Scheduled Meds: . amLODipine  10 mg Oral QHS  . calcitRIOL  2.25 mcg Oral Q M,W,F-HD  . calcium acetate  1,334 mg Oral TID WC  . docusate sodium  100 mg Oral BID  . heparin  5,000 Units Subcutaneous Q8H  . HYDROmorphone   Intravenous Q4H  . insulin aspart  0-9 Units Subcutaneous TID WC  . insulin glargine  7 Units Subcutaneous QHS  . lanthanum  2,000 mg Oral TID WC  . multivitamin  1 tablet Oral QHS  . nicotine  21 mg Transdermal Daily   Continuous Infusions: . sodium chloride 10 mL/hr at 03/11/17 1440  . sodium chloride 10 mL/hr at 03/11/17 2218  . methocarbamol (ROBAXIN)  IV 500 mg (03/11/17 1759)  . piperacillin-tazobactam (ZOSYN)  IV Stopped (03/13/17 0926)     LOS: 5 days   Time Spent in minutes   30 minutes  Arian Mcquitty D.O. on 03/13/2017 at 10:57 AM  Between 7am to 7pm - Pager - [phone]  After 7pm go to [email or web address] - password [email or web address]  And look for the night coverage person covering for me after hours  [facility] Office  [phone]

## 2017-03-13 NOTE — Progress Notes (Signed)
Subjective:   Cos of post pain  But better>Admit team changing pain meds   Objective Vital signs in last 24 hours: Vitals:   03/13/17 0400 03/13/17 0634 03/13/17 0800 03/13/17 0900  BP:  (!) 158/95  (!) 167/94  Pulse:  95  (!) 103  Resp: 16 17 12 18   Temp:  99.3 F (37.4 C)  98.5 F (36.9 C)  TempSrc:  Oral  Oral  SpO2: 99% 100% 96% 99%  Weight:      Height:       Weight change: 2.115 kg (4 lb 10.6 oz)  Physical Exam General:Sitting in  Bedside chair , alert, Ox3 , NAD Lungs: CTA Bilat  Abdomen: soft NT , ND Extremities: L BKA w/ VAC in place; R foot wrapped Dialysis Access: LUE AVF Pos bruit   Dialysis Orders: [facility] MWF  4h   500/800  2/2 bath  P2   80.5kg  LUA AVF  Hep 8000 Venofer 100 mg IV x10 (until 8/10) Calcitriol 2.25 mcg PO q HD BMM: Fosrenol 1000 2 tabs ti/ Ca acetate 3 tabs tid   Problem/Plan:: 1. Gangrene - sp L BKA and R 2nd toe amp 03/11/17.  Pt aware of need for Rehab in hosp  2. ESRD - MWF HD on schedule  3. Hypertension/volume - stable, at dry wt/ on 10 mg amlodipine hs  4. Anemia - Hgb 11.1 > 8.4 now start   ESA tomorrow HD  Q mon /Hold Fe with infection  5. Metabolic bone disease - Continue VDRA/binders / ^  P 8.2   Ca corec 9.3 ,  Pt claims not taking binders  Because Phos lo AND FOSRENOL  Not given  with meals /reck pre hd / while in [place] use ONLY One Binder Fosrenol  6. Nutrition -Renal/Dm diet/ 7. DM - per primary  Ernest Haber, PA-C [place] [phone] 03/13/2017,12:29 PM  LOS: 5 days   Pt seen, examined and agree w A/P as above.  Kelly Splinter MD [facility] pager [phone]   03/13/2017, 1:16 PM    Labs: Basic Metabolic Panel:  Recent Labs Lab 03/09/17 0719  03/11/17 0359 03/12/17 0356 03/13/17 0611  NA 134*  < > 132* 131* 126*  K 4.2  < > 4.1 4.3 4.5  CL 94*  < > 92* 92* 88*  CO2 23  < > 25 23 23   GLUCOSE 70  < > 136* 179* 146*  BUN 54*  < > 46* 30* 42*  CREATININE 12.38*   < > 9.63* 7.11* 8.40*  CALCIUM 8.8*  < > 9.0 8.9 8.9  PHOS 8.2*  --   --   --   --   < > = values in this interval not displayed. Liver Function Tests:  Recent Labs Lab 03/07/17 2103 03/09/17 0719  AST 25  --   ALT 14*  --   ALKPHOS 137*  --   BILITOT 1.5*  --   PROT 7.1  --   ALBUMIN 3.5 3.2*   No results for input(s): LIPASE, AMYLASE in the last 168 hours. No results for input(s): AMMONIA in the last 168 hours. CBC:  Recent Labs Lab 03/07/17 2103  03/09/17 0719 03/10/17 0502 03/11/17 0359 03/12/17 0356 03/13/17 0611  WBC 5.9  < > 6.7 7.5 7.1 7.3 8.5  NEUTROABS 3.8  --   --   --   --   --   --   HGB 11.1*  < > 10.4* 10.4* 10.2* 9.0*  8.4*  HCT 34.0*  < > 32.5* 31.7* 32.4* 28.7* 26.1*  MCV 73.8*  < > 75.4* 75.3* 74.1* 75.9* 73.7*  PLT 233  < > 227 235 257 311 297  < > = values in this interval not displayed. Cardiac Enzymes: No results for input(s): CKTOTAL, CKMB, CKMBINDEX, TROPONINI in the last 168 hours. CBG:  Recent Labs Lab 03/12/17 1240 03/12/17 1734 03/12/17 2112 03/13/17 0743 03/13/17 1207  GLUCAP 105* 196* 123* 114* 108*    Studies/Results: No results found. Medications: . sodium chloride 10 mL/hr at 03/11/17 1440  . sodium chloride 10 mL/hr at 03/11/17 2218  . methocarbamol (ROBAXIN)  IV 500 mg (03/11/17 1759)  . piperacillin-tazobactam (ZOSYN)  IV Stopped (03/13/17 0926)   . amLODipine  10 mg Oral QHS  . calcitRIOL  2.25 mcg Oral Q M,W,F-HD  . calcium acetate  1,334 mg Oral TID WC  . docusate sodium  100 mg Oral BID  . heparin  5,000 Units Subcutaneous Q8H  . insulin aspart  0-9 Units Subcutaneous TID WC  . insulin glargine  7 Units Subcutaneous QHS  . lanthanum  2,000 mg Oral TID WC  . morphine  15 mg Oral Q12H  . multivitamin  1 tablet Oral QHS  . nicotine  21 mg Transdermal Daily  . polyethylene glycol  17 g Oral Daily

## 2017-03-14 LAB — GLUCOSE, CAPILLARY
GLUCOSE-CAPILLARY: 141 mg/dL — AB (ref 65–99)
Glucose-Capillary: 173 mg/dL — ABNORMAL HIGH (ref 65–99)
Glucose-Capillary: 168 mg/dL — ABNORMAL HIGH (ref 65–99)

## 2017-03-14 LAB — CBC
HCT: 23.2 % — ABNORMAL LOW (ref 39.0–52.0)
HEMOGLOBIN: 7.6 g/dL — AB (ref 13.0–17.0)
MCH: 23.8 pg — AB (ref 26.0–34.0)
MCHC: 32.8 g/dL (ref 30.0–36.0)
MCV: 72.5 fL — AB (ref 78.0–100.0)
Platelets: 235 10*3/uL (ref 150–400)
RBC: 3.2 MIL/uL — AB (ref 4.22–5.81)
RDW: 21.2 % — ABNORMAL HIGH (ref 11.5–15.5)
WBC: 9.4 10*3/uL (ref 4.0–10.5)

## 2017-03-14 LAB — BASIC METABOLIC PANEL
ANION GAP: 18 — AB (ref 5–15)
BUN: 56 mg/dL — AB (ref 6–20)
CALCIUM: 8.7 mg/dL — AB (ref 8.9–10.3)
CHLORIDE: 86 mmol/L — AB (ref 101–111)
CO2: 21 mmol/L — ABNORMAL LOW (ref 22–32)
Creatinine, Ser: 10.12 mg/dL — ABNORMAL HIGH (ref 0.61–1.24)
GFR calc Af Amer: 7 mL/min — ABNORMAL LOW (ref >60)
GFR, EST NON AFRICAN AMERICAN: 6 mL/min — AB (ref >60)
GLUCOSE: 135 mg/dL — AB (ref 65–99)
POTASSIUM: 4.8 mmol/L (ref 3.5–5.1)
Sodium: 125 mmol/L — ABNORMAL LOW (ref 135–145)

## 2017-03-14 LAB — PREPARE RBC (CROSSMATCH)

## 2017-03-14 MED ORDER — MORPHINE SULFATE (PF) 4 MG/ML IV SOLN
INTRAVENOUS | Status: AC
Start: 2017-03-14 — End: 2017-03-14
  Filled 2017-03-14: qty 1

## 2017-03-14 MED ORDER — SODIUM CHLORIDE 0.9 % IV SOLN: Freq: Once | INTRAVENOUS | Status: AC

## 2017-03-14 MED ORDER — OXYCODONE HCL 5 MG PO TABS
ORAL_TABLET | ORAL | Status: AC
  Filled 2017-03-14: qty 2

## 2017-03-14 MED ORDER — CARVEDILOL 3.125 MG PO TABS
3.1250 mg | ORAL_TABLET | Freq: Two times a day (BID) | ORAL | Status: DC
  Administered 2017-03-14 – 2017-03-17 (×5): 3.125 mg via ORAL
  Filled 2017-03-14 (×6): qty 1

## 2017-03-14 MED ORDER — HYDROMORPHONE HCL 1 MG/ML IJ SOLN
1.0000 mg | Freq: Once | INTRAMUSCULAR | Status: AC
  Administered 2017-03-14: 1 mg via INTRAVENOUS

## 2017-03-14 MED ORDER — FENTANYL 12 MCG/HR TD PT72
12.5000 ug | MEDICATED_PATCH | TRANSDERMAL | Status: DC
  Administered 2017-03-14 – 2017-03-15 (×2): 12.5 ug via TRANSDERMAL
  Filled 2017-03-14 (×2): qty 1

## 2017-03-14 MED ORDER — HYDROMORPHONE HCL 1 MG/ML IJ SOLN
INTRAMUSCULAR | Status: AC
  Filled 2017-03-14: qty 1

## 2017-03-14 MED ORDER — DARBEPOETIN ALFA 100 MCG/0.5ML IJ SOSY
PREFILLED_SYRINGE | INTRAMUSCULAR | Status: AC
  Filled 2017-03-14: qty 0.5

## 2017-03-14 NOTE — Progress Notes (Signed)
PT Cancellation Note  Patient Details Name: [name] Bartolucci Sr. MRN: [id number] DOB: [birth year]   Cancelled Treatment:    Reason Eval/Treat Not Completed: Patient at procedure or test/unavailable   Currently in HD;  Will follow up later today as time allows;  Otherwise, will follow up for PT tomorrow;   Thank you,  Roney Marion, PT  Acute Rehabilitation Services Pager [phone] Office [phone]     [place] 03/14/2017, 10:55 AM

## 2017-03-14 NOTE — Progress Notes (Signed)
OT Cancellation    03/14/17 0800  OT Visit Information  Last OT Received On 03/14/17  Reason Eval/Treat Not Completed Patient at procedure or test/ unavailable (HD. Will check back as schedule allows)   [place], OTR/L Acute Rehab Pager: [phone] Office: [phone]

## 2017-03-14 NOTE — Progress Notes (Addendum)
PROGRESS NOTE    [name] Sr.  PPJ:[id number] DOB: 1976-03-08 DOA: 03/07/2017 PCP: Patient, [name] Per   Chief Complaint  Patient presents with  . Foot Pain    Brief Narrative:  HPI on 03/08/17 by Ms. [name], PA [name] Highfill Sr. is a 41 y.o. male with medical history significant for ESRD on HD, last performed on 03/07/2017, hypertension, diabetes, combined systolic and diastolic CHF, PVD, CAD, tobacco abuse, and status post amputation of all toes on L foot and , R and great toe on 3/23 with stump revision on 5/29  by [name], presenting with bilateral foot pain and drainage with foul smelling odor, at the amputation site. He reported to ashy discoloration for about one week. Denies any fever chills nausea or vomiting or shortness of breath. He denies any chest pain. Of note, the patient had been on doxycycline for one month, which he felt he was not helping, despite being compliant to the med.  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, and patient was placed on Dilaudid PCA pump -PT assessed patient and recommended CIR -Inpatient rehab consulted and appreciated- patient is a potential CIR candidate -Discussed with social work possible SNF -Will attempt to transition to oral pain control with morphine for break through. Was started on MS contin, however not appropriate long term given ESRD -Discussed with pharmacy. Will transition to low dose fentanyl patch  along with oxycodone and continue to monitor pain needs.  -Continue miralax scheduled daily to avoid constipation  Uncontrolled pain -Secondary to the above -see plan and treatment as above  End-stage renal disease -Patient dialyzes MWF -Nephrology consultation appreciated  Nonsustained VTach -Supposedly had 9beat run of VT on the evening of 03/09/17 -Was asymptomatic, potassium and magnesium WNL -No further events  Chronic combined systolic and diastolic heart failure -Echocardiogram 09/05/2014 showed EF of 58-09%, grade 2 diastolic dysfunction -Currently appears to be euvolemic -Continue volume control with hemodialysis  Essential hypertension -Continue amlodipine and IV hydralazine as needed -Low dose coreg added for better BP control  Diabetes mellitus, type II -Continue Lantus, insulin sliding scale and CBG monitoring -Hemoglobin A1c 6.1  Anemia of chronic disease/Acute blood loss anemia secondary to surgery -Baseline hemoglobin approximately 8-10, currently 7.6 -Discussed with Dr. Posey Pronto, nephrology, will transfuse [facility] with HD -Continue to monitor CBC  Tobacco abuse -Smoking cessation discussed -Continue nicotine patch   Itchiness -possibly related to ESRD as patient was scratching today upon examination in HD and was receiving no antibiotics -Continue sarna PRN  DVT Prophylaxis  heparin  Code Status: Full  Family Communication: None at bedside  Disposition Plan: Admitted. Will need CIR vs SNF. Continue pain control  Consultants Orthopedic surgery Nephrology Inpatient rehab  Procedures  Left BKA Right 2nd toe amputation   Antibiotics   Anti-infectives    Start     Dose/Rate Route Frequency Ordered Stop   03/11/17 1300  ceFAZolin (ANCEF) IVPB 2g/100 mL premix     2 g 200 mL/hr over 30 Minutes Intravenous To ShortStay  Surgical 03/10/17 2148 03/11/17 1614   03/09/17 1200  vancomycin (VANCOCIN) IVPB 1000 mg/200 mL premix  Status:  Discontinued      1,000 mg 200 mL/hr over 60 Minutes Intravenous Every M-W-F (Hemodialysis) 03/08/17 0115 03/12/17 1243   03/08/17 0130  vancomycin (VANCOCIN) 2,000 mg in sodium chloride 0.9 % 500 mL IVPB     2,000 mg 250 mL/hr over 120 Minutes Intravenous  Once 03/08/17 0115 03/08/17 0343   03/08/17 0130  piperacillin-tazobactam (ZOSYN) IVPB 3.375 g     3.375 g 12.5 mL/hr over 240 Minutes Intravenous Every 12 hours 03/08/17 0115 03/13/17 2359   03/08/17 0015  vancomycin (VANCOCIN) IVPB 1000 mg/200 mL premix  Status:  Discontinued     1,000 mg 200 mL/hr over 60 Minutes Intravenous  Once 03/08/17 0001 03/08/17 0111   03/08/17 0015  piperacillin-tazobactam (ZOSYN) IVPB 3.375 g  Status:  Discontinued     3.375 g 100 mL/hr over 30 Minutes Intravenous  Once 03/08/17 0001 03/08/17 0111      Subjective:   [name] seen and examined today in dialysis. Continues to complain of pain, occurred yesterday while working with physical therapy. Denies chest pain, shortness of breath, abdominal pain, N/V/D/C. States he needs to speak to a Education officer, museum regarding his home situation.   Objective:   Vitals:   03/14/17 1030 03/14/17 1043 03/14/17 1044 03/14/17 1045  BP: (!) 168/32  (!) 189/41 (!) 189/41  Pulse: (!) 102  (!) 103 (!) 103  Resp: 18  17 17   Temp: 98.6 F (37 C) 98.1 F (36.7 C) 98.1 F (36.7 C) 98.1 F (36.7 C)  TempSrc: Oral  Oral Oral  SpO2:      Weight:      Height:        Intake/Output Summary (Last 24 hours) at 03/14/17 1052 Last data filed at 03/14/17 1045  Gross per 24 hour  Intake              540 ml  Output                0 ml  Net              540 ml   Filed Weights   03/12/17 2103 03/13/17 2136 03/14/17 0711  Weight: 83.9 kg (185 lb) 88 kg (194 lb) 87.6 kg (193 lb 2 oz)   Exam  [name]: Well developed, well nourished, NAD, appears stated age  9: NCAT, mucous membranes moist.   Cardiovascular: S1 S2 auscultated, RRR, 2/6SEM  Respiratory: Clear to auscultation  bilaterally with equal chest rise, no wheezing  Abdomen: Soft, nontender, nondistended, + bowel sounds  Extremities: Left BKA with wound vac, Right foot dressing in place  Neuro: AAOx3, nonfocal  Psych: Appropriate  Data Reviewed: I have personally reviewed following labs and imaging studies  CBC:  Recent Labs Lab 03/07/17 2103  03/10/17 0502 03/11/17 0359 03/12/17 0356 03/13/17 0611 03/14/17 0617  WBC 5.9  < > 7.5 7.1 7.3 8.5 9.4  NEUTROABS 3.8  --   --   --   --   --   --   HGB 11.1*  < > 10.4* 10.2* 9.0* 8.4* 7.6*  HCT 34.0*  < > 31.7* 32.4* 28.7* 26.1* 23.2*  MCV 73.8*  < > 75.3* 74.1* 75.9* 73.7* 72.5*  PLT 233  < > 235 257 311 297 235  < > = values in this interval not displayed. Basic Metabolic Panel:  Recent Labs Lab 03/09/17 0719 03/10/17  0502 03/10/17 1110 03/11/17 0359 03/12/17 0356 03/13/17 0611 03/14/17 0617  NA 134* 133*  --  132* 131* 126* 125*  K 4.2 3.6  --  4.1 4.3 4.5 4.8  CL 94* 92*  --  92* 92* 88* 86*  CO2 23 26  --  25 23 23  21*  GLUCOSE 70 63*  --  136* 179* 146* 135*  BUN 54* 31*  --  46* 30* 42* 56*  CREATININE 12.38* 8.13*  --  9.63* 7.11* 8.40* 10.12*  CALCIUM 8.8* 9.2  --  9.0 8.9 8.9 8.7*  MG  --   --  2.3  --   --   --   --   PHOS 8.2*  --   --   --   --   --   --    GFR: Estimated Creatinine Clearance: 11.3 mL/min (A) (by C-G formula based on SCr of 10.12 mg/dL (H)). Liver Function Tests:  Recent Labs Lab 03/07/17 2103 03/09/17 0719  AST 25  --   ALT 14*  --   ALKPHOS 137*  --   BILITOT 1.5*  --   PROT 7.1  --   ALBUMIN 3.5 3.2*   No results for input(s): LIPASE, AMYLASE in the last 168 hours. No results for input(s): AMMONIA in the last 168 hours. Coagulation Profile: No results for input(s): INR, PROTIME in the last 168 hours. Cardiac Enzymes: No results for input(s): CKTOTAL, CKMB, CKMBINDEX, TROPONINI in the last 168 hours. BNP (last 3 results) No results for input(s): PROBNP in the last 8760  hours. HbA1C:  Recent Labs  03/11/17 1122  HGBA1C 6.1*   CBG:  Recent Labs Lab 03/12/17 2112 03/13/17 0743 03/13/17 1207 03/13/17 1759 03/13/17 2133  GLUCAP 123* 114* 108* 153* 158*   Lipid Profile: No results for input(s): CHOL, HDL, LDLCALC, TRIG, CHOLHDL, LDLDIRECT in the last 72 hours. Thyroid Function Tests: No results for input(s): TSH, T4TOTAL, FREET4, T3FREE, THYROIDAB in the last 72 hours. Anemia Panel: No results for input(s): VITAMINB12, FOLATE, FERRITIN, TIBC, IRON, RETICCTPCT in the last 72 hours. Urine analysis:    Component Value Date/Time   COLORURINE RED BIOCHEMICALS MAY BE AFFECTED BY COLOR (A) 01/29/2010 0939   APPEARANCEUR CLOUDY (A) 01/29/2010 0939   LABSPEC 1.020 01/29/2010 0939   PHURINE 8.0 01/29/2010 0939   GLUCOSEU NEGATIVE 01/29/2010 0939   HGBUR LARGE (A) 01/29/2010 0939   BILIRUBINUR MODERATE (A) 01/29/2010 0939   KETONESUR 15 (A) 01/29/2010 0939   PROTEINUR >300 (A) 01/29/2010 0939   UROBILINOGEN 0.2 01/29/2010 0939   NITRITE POSITIVE (A) 01/29/2010 0939   LEUKOCYTESUR LARGE (A) 01/29/2010 0939   Sepsis Labs: @LABRCNTIP (procalcitonin:4,lacticidven:4)  ) Recent Results (from the past 240 hour(s))  Blood culture (routine x 2)     Status: None   Collection Time: 03/07/17 10:58 PM  Result Value Ref Range Status   Specimen Description BLOOD RIGHT FOREARM  Final   Special Requests IN PEDIATRIC BOTTLE Blood Culture adequate volume  Final   Culture NO GROWTH 5 DAYS  Final   Report Status 03/13/2017 FINAL  Final  Blood culture (routine x 2)     Status: None   Collection Time: 03/07/17 11:04 PM  Result Value Ref Range Status   Specimen Description BLOOD RIGHT HAND  Final   Special Requests   Final    BOTTLES DRAWN AEROBIC AND ANAEROBIC Blood Culture adequate volume   Culture NO GROWTH 5 DAYS  Final   Report Status 03/13/2017 FINAL  Final  MRSA PCR Screening     Status: None   Collection Time: 03/08/17  5:17 PM  Result Value Ref Range  Status   MRSA by PCR NEGATIVE NEGATIVE Final    Comment:        The GeneXpert MRSA Assay (FDA approved for NASAL specimens only), is one component of a comprehensive MRSA colonization surveillance program. It is not intended to diagnose MRSA infection nor to guide or monitor treatment for MRSA infections.   Surgical pcr screen     Status: None   Collection Time: 03/10/17 10:11 PM  Result Value Ref Range Status   MRSA, PCR NEGATIVE NEGATIVE Final   Staphylococcus aureus NEGATIVE NEGATIVE Final    Comment:        The Xpert SA Assay (FDA approved for NASAL specimens in patients over 73 years of age), is one component of a comprehensive surveillance program.  Test performance has been validated by [facility] for patients greater than or equal to 31 year old. It is not intended to diagnose infection nor to guide or monitor treatment.       Radiology Studies: No results found.   Scheduled Meds: . amLODipine  10 mg Oral QHS  . calcitRIOL  2.25 mcg Oral Q M,W,F-HD  . carvedilol  3.125 mg Oral BID WC  . darbepoetin (ARANESP) injection - DIALYSIS  100 mcg Intravenous Q Mon-HD  . docusate sodium  100 mg Oral BID  . heparin  5,000 Units Subcutaneous Q8H  . insulin aspart  0-9 Units Subcutaneous TID WC  . insulin glargine  7 Units Subcutaneous QHS  . lanthanum  2,000 mg Oral TID WC  . morphine  15 mg Oral Q12H  . multivitamin  1 tablet Oral QHS  . nicotine  21 mg Transdermal Daily  . polyethylene glycol  17 g Oral Daily   Continuous Infusions: . sodium chloride    . methocarbamol (ROBAXIN)  IV 500 mg (03/11/17 1759)     LOS: 6 days   Time Spent in minutes   30 minutes  Kadyn Chovan D.O. on 03/14/2017 at 10:52 AM  Between 7am to 7pm - Pager - [phone]  After 7pm go to [email or web address] - password [email or web address]  And look for the night coverage person covering for me after hours  [facility] Office  [phone]

## 2017-03-14 NOTE — Procedures (Signed)
Patient seen on Hemodialysis. QB 400, UF goal 3.5L Treatment adjusted as needed.  Elmarie Shiley MD [facility]. Office # [phone] Pager # [phone] 9:10 AM

## 2017-03-14 NOTE — Progress Notes (Signed)
Patient ID: [name] Frayre Sr., male   DOB: [birth year], 41 y.o.   MRN: [id number]  [facility] Progress Note   Assessment/ Plan:   1. Osteomyelitis of the left foot with gangrene - Now s/p L BKA and R 2nd toe amp 03/11/17. Plans underway for admission to CIR for PT/OT   2. ESRD - MWF HD -continue current schedule with daily evaluation for acute HD needs 3. Hypertension/volume - elevated BP noted, at dry weight on 10 mg amlodipine QHS- plan to start low dose scheduled beta blocker. 4. Anemia - Hgb 11.1 > 8.4 > 7.6--plan for 2 units PRBC transfusion at dialysis today 5. Metabolic bone disease - Continue calcitriol for PTH suppression and fosrenol for phosphorus binding. Trend phosphorus level.  6. Nutrition -Renal/DM diet/ 7. DM - per primary  Subjective:   Complains of surgical site pain   Objective:   BP (!) 180/67   Pulse 98   Temp 98.6 F (37 C) (Oral)   Resp (!) 23   Ht 6\' 2"  (1.88 m)   Wt 87.6 kg (193 lb 2 oz) Comment: Bed Scale  SpO2 99%   BMI 24.80 kg/m   Physical Exam: PNT:[id number] resting on dialysis QPY:PPJKD regular rhythm and normal rate, normal s1 and s2 Resp:clear bilaterally, no rales/rhonchi TOI:ZTIW, flat, NT, bowel sounds normal Ext:s/p Left BKA with wound vac in place, right foot in clean wrap/dressing  Labs: BMET  Recent Labs Lab 03/07/17 2103 03/08/17 5809 03/09/17 0719 03/10/17 0502 03/11/17 0359 03/12/17 0356 03/13/17 0611 03/14/17 0617  NA 134*  --  134* 133* 132* 131* 126* 125*  K 3.5  --  4.2 3.6 4.1 4.3 4.5 4.8  CL 93*  --  94* 92* 92* 92* 88* 86*  CO2 23  --  23 26 25 23 23  21*  GLUCOSE 103*  --  70 63* 136* 179* 146* 135*  BUN 40*  --  54* 31* 46* 30* 42* 56*  CREATININE 10.64* 10.96* 12.38* 8.13* 9.63* 7.11* 8.40* 10.12*  CALCIUM 8.6*  --  8.8* 9.2 9.0 8.9 8.9 8.7*  PHOS  --   --  8.2*  --   --   --   --   --    CBC  Recent Labs Lab 03/07/17 2103  03/11/17 0359 03/12/17 0356 03/13/17 0611  03/14/17 0617  WBC 5.9  < > 7.1 7.3 8.5 9.4  NEUTROABS 3.8  --   --   --   --   --   HGB 11.1*  < > 10.2* 9.0* 8.4* 7.6*  HCT 34.0*  < > 32.4* 28.7* 26.1* 23.2*  MCV 73.8*  < > 74.1* 75.9* 73.7* 72.5*  PLT 233  < > 257 311 297 235  < > = values in this interval not displayed.  Medications:    . amLODipine  10 mg Oral QHS  . calcitRIOL  2.25 mcg Oral Q M,W,F-HD  . darbepoetin (ARANESP) injection - DIALYSIS  100 mcg Intravenous Q Mon-HD  . docusate sodium  100 mg Oral BID  . heparin  5,000 Units Subcutaneous Q8H  .  HYDROmorphone (DILAUDID) injection  1 mg Intravenous Once in dialysis  . insulin aspart  0-9 Units Subcutaneous TID WC  . insulin glargine  7 Units Subcutaneous QHS  . lanthanum  2,000 mg Oral TID WC  . morphine  15 mg Oral Q12H  . multivitamin  1 tablet Oral QHS  . nicotine  21 mg Transdermal Daily  . polyethylene glycol  17 g Oral Daily   Elmarie Shiley, MD 03/14/2017, 8:40 AM

## 2017-03-15 LAB — CBC
HCT: 28.4 % — ABNORMAL LOW (ref 39.0–52.0)
HEMOGLOBIN: 9.4 g/dL — AB (ref 13.0–17.0)
MCH: 24.7 pg — AB (ref 26.0–34.0)
MCHC: 33.1 g/dL (ref 30.0–36.0)
MCV: 74.5 fL — ABNORMAL LOW (ref 78.0–100.0)
Platelets: 252 10*3/uL (ref 150–400)
RBC: 3.81 MIL/uL — ABNORMAL LOW (ref 4.22–5.81)
RDW: 20.1 % — ABNORMAL HIGH (ref 11.5–15.5)
WBC: 8.5 10*3/uL (ref 4.0–10.5)

## 2017-03-15 LAB — BASIC METABOLIC PANEL
ANION GAP: 12 (ref 5–15)
BUN: 35 mg/dL — ABNORMAL HIGH (ref 6–20)
CALCIUM: 8.8 mg/dL — AB (ref 8.9–10.3)
CO2: 24 mmol/L (ref 22–32)
CREATININE: 6.96 mg/dL — AB (ref 0.61–1.24)
Chloride: 93 mmol/L — ABNORMAL LOW (ref 101–111)
GFR, EST AFRICAN AMERICAN: 10 mL/min — AB (ref >60)
GFR, EST NON AFRICAN AMERICAN: 9 mL/min — AB (ref >60)
Glucose, Bld: 92 mg/dL (ref 65–99)
Potassium: 3.9 mmol/L (ref 3.5–5.1)
SODIUM: 129 mmol/L — AB (ref 135–145)

## 2017-03-15 LAB — TYPE AND SCREEN
ABO/RH(D): B POS
Antibody Screen: NEGATIVE
Unit division: 0
Unit division: 0

## 2017-03-15 LAB — BPAM RBC
BLOOD PRODUCT EXPIRATION DATE: 201808172359
BLOOD PRODUCT EXPIRATION DATE: 201808232359
ISSUE DATE / TIME: 201807300950
ISSUE DATE / TIME: 201807300950
UNIT TYPE AND RH: 7300
Unit Type and Rh: 7300

## 2017-03-15 LAB — GLUCOSE, CAPILLARY
GLUCOSE-CAPILLARY: 79 mg/dL (ref 65–99)
GLUCOSE-CAPILLARY: 191 mg/dL — AB (ref 65–99)
GLUCOSE-CAPILLARY: 159 mg/dL — AB (ref 65–99)
Glucose-Capillary: 184 mg/dL — ABNORMAL HIGH (ref 65–99)

## 2017-03-15 MED ORDER — CALCIUM ACETATE (PHOS BINDER) 667 MG PO CAPS
2001.0000 mg | ORAL_CAPSULE | Freq: Three times a day (TID) | ORAL | Status: DC
  Filled 2017-03-15 (×2): qty 3

## 2017-03-15 MED ORDER — PRO-STAT SUGAR FREE PO LIQD
30.0000 mL | Freq: Two times a day (BID) | ORAL | Status: DC
  Administered 2017-03-15 – 2017-03-16 (×4): 30 mL via ORAL
  Filled 2017-03-15 (×5): qty 30

## 2017-03-15 NOTE — Progress Notes (Signed)
Rehab admissions - I am following for potential acute inpatient rehab admission.  Limited beds available this week.  I would like to see how patient does with next therapy session.  #[phone]

## 2017-03-15 NOTE — Progress Notes (Signed)
Physical Therapy Treatment Patient Details Name: [name] Schlabach Sr. MRN: [id number] DOB: 14-Jan-1976 Today's Date: 03/15/2017    History of Present Illness [name] Tuggle Sr. is a 41 y.o. male with medical history significant for ESRD on HD, last performed on 03/07/2017, hypertension, diabetes, combined systolic and diastolic CHF, PVD, CAD, tobacco abuse, and status post amputation of all toes on L foot and , R and great toe on 3/23 with stump revision on 5/29  by [name], presenting with bilateral foot pain and drainage with foul smelling odor, at the amputation site. Osteomyelitis of the foot/dehiscence of the left metatarsal amputation with gangrenous changes; now s/p L BKA, and R second toe amputation    PT Comments    Pt presented awake and alert sitting in chair. Pt was ready and willing to participate in therapy with a pain level of 6 initially. Pt is min guard for sit to/from stand, needing cues for hand placement on walker. Patient ambulated (min guard) requiring cuing to stand straight, to maintain walker in safe distance to center of gravity during swing to gait pattern, and for straightening of residual limb at knee while ambulating. Strengthening of left quad and straightening of knee led to pain that elicited crying, but pt completed 5 reps until pain was limiting. Pt is progressing towards goals, and will benefit from skilled acute PT services to increase strength and functional mobility for safety, and to prepare left residual limb for prosthetic assessment.   Pt is young, motivated, and demonstrates good strength, and I believe that CIR will best benefit pt to help him safely return home with optimized function, and will be an excellent start towards prosthesis training.   Follow Up Recommendations  CIR     Equipment Recommendations  Rolling walker with 5" wheels;3in1 (PT)    Recommendations for Other Services       Precautions / Restrictions  Precautions Precautions: Fall Precaution Comments: VAC LLE Required Braces or Orthoses: Other Brace/Splint Other Brace/Splint: Post op shoe R foot Restrictions Weight Bearing Restrictions: No LLE Weight Bearing: Non weight bearing    Mobility  Bed Mobility                  Transfers Overall transfer level: Needs assistance Equipment used: Rolling walker (2 wheeled)   Sit to Stand: Min guard         General transfer comment: 2 bouts of sit to stand, cues for hand placement. Pt first attempt with 1 hand on walker 1 hand on chair. Second bout with both hands pushing from chair was more stable than first.   Ambulation/Gait Ambulation/Gait assistance: Min guard (Patient required cues for trunk extension while walking) Ambulation Distance (Feet):  Assistive device: Rolling walker (2 wheeled) (Patient required cues for keeping walker close to body. )       General Gait Details:  Patient demonstrated swing to gait patern with right foot. Patient required cues for effecient use of rolling walker, safe and optimal step length, and self monitoring for activity tolerance.    Stairs            Wheelchair Mobility    Modified Rankin (Stroke Patients Only)       Balance Overall balance assessment: Modified Independent Sitting-balance support: Feet supported Sitting balance-Leahy Scale: Good     Standing balance support: Bilateral upper extremity supported Standing balance-Leahy Scale: Fair  Cognition Arousal/Alertness: Awake/alert Behavior During Therapy: WFL for tasks assessed/performed Overall Cognitive Status: Within Functional Limits for tasks assessed                                        Exercises Amputee Exercises Knee Flexion: AROM;Strengthening;Left;Seated;5 reps Knee Extension: AROM;5 reps;Left;Seated;Strengthening    General Comments        Pertinent Vitals/Pain Pain Assessment:  0-10 Pain Score: 8  Pain Assessment: Faces Pain Score: 10 Pain Location: L residual limb Pain Descriptors / Indicators: Aching;Constant Pain Intervention(s): Monitored during session;Repositioned    Home Living                      Prior Function            PT Goals (current goals can now be found in the care plan section) Progress towards PT goals: Progressing toward goals    Frequency    Min 3X/week      PT Plan      Co-evaluation              AM-PAC PT "6 Clicks" Daily Activity  Outcome Measure  Difficulty turning over in bed (including adjusting bedclothes, sheets and blankets)?: None Difficulty moving from lying on back to sitting on the side of the bed? : None Difficulty sitting down on and standing up from a chair with arms (e.g., wheelchair, bedside commode, etc,.)?: Total Help needed moving to and from a bed to chair (including a wheelchair)?: A little Help needed walking in hospital room?: A little Help needed climbing 3-5 steps with a railing? : A lot 6 Click Score: 17    End of Session Equipment Utilized During Treatment: Gait belt Activity Tolerance: Patient tolerated treatment well (Patient was in severe pain (to tears) during end of session.) Patient left: with nursing/sitter in room;in chair   PT Visit Diagnosis: Other abnormalities of gait and mobility (R26.89);Pain Pain - Right/Left: Left Pain - part of body: Leg     Time: 9678-9381 PT Time Calculation (min) (ACUTE ONLY): 22 min  Charges:  $Gait Training: 8-22 mins                    G Codes:       [name] SPT Acute Rehabilitation Services Office: [phone]   [name] 03/15/2017, 4:04 PM

## 2017-03-15 NOTE — Progress Notes (Signed)
PROGRESS NOTE    Drayden Lukas Sr.  GXQ:[id number] DOB: February 27, 1976 DOA: 03/07/2017 PCP: Patient, [name] Per   Chief Complaint  Patient presents with  . Foot Pain    Brief Narrative:  HPI on 03/08/17 by Ms. [name], PA Bertram Haddix Sr. is a 41 y.o. male with medical history significant for ESRD on HD, last performed on 03/07/2017, hypertension, diabetes, combined systolic and diastolic CHF, PVD, CAD, tobacco abuse, and status post amputation of all toes on L foot and , R and great toe on 3/23 with stump revision on 5/29  by [name], presenting with bilateral foot pain and drainage with foul smelling odor, at the amputation site. He reported to ashy discoloration for about one week. Denies any fever chills nausea or vomiting or shortness of breath. He denies any chest pain. Of note, the patient had been on doxycycline for one month, which he felt he was not helping, despite being compliant to the med.   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 Pending SNF vs CIR. Trying to control pain.   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 with gangrenous changes -In the setting of diabetes and PVD -Post transmetatarsal indication on the left, right great toe and patient 3/23 with a stump revision on 5/29 -Was initially placed on IV vancomycin and Zosyn, however discontinued 48hrs post op -Orthopedic surgery consultation appreciated -S/p Left BKA, right 2nd toe amputation  -Blood cultures shows no growth to date -Patient was on dilaudid and oxycodone however, pain not controlled  -Discussed with [name], and patient was placed on Dilaudid PCA pump -PT assessed patient and recommended CIR -Inpatient rehab consulted and appreciated- patient is a potential CIR candidate -Discussed with social work possible SNF -Continue pain control with fentanyl patch and oxycodone  -Continue miralax  scheduled daily to avoid constipation  Uncontrolled pain -Secondary to the above -see plan and treatment as above  End-stage renal disease -Patient dialyzes MWF -Nephrology consultation appreciated  Nonsustained VTach -Supposedly had 9beat run of VT on the evening of 03/09/17 -Was asymptomatic, potassium and magnesium WNL -No further events  Chronic combined systolic and diastolic heart failure -Echocardiogram 09/05/2014 showed EF of 14-48%, grade 2 diastolic dysfunction -Currently appears to be euvolemic -Continue volume control with hemodialysis  Essential hypertension -Continue amlodipine and IV hydralazine as needed -Low dose coreg added for better BP control  Diabetes mellitus, type II -Continue Lantus, insulin sliding scale and CBG monitoring -Hemoglobin A1c 6.1  Anemia of chronic disease/Acute blood loss anemia secondary to surgery -Baseline hemoglobin approximately 8-10 -hemoglobin dropped to 7.6, was tranfused 2u PRBCs on 03/14/17 -hemoglobin currenlty 9.4 -Continue to monitor CBC  Tobacco abuse -Smoking cessation discussed -Continue nicotine patch   Itchiness -possibly related to ESRD as patient was scratching today upon examination in HD and was receiving no antibiotics -Continue sarna PRN  Hyponatremia, chronic -upon review of Epic, patient's sodium level has been around 132-134 -Currently 129 (was down to 125), improved with HD -Patient was given IVF in the ED  DVT Prophylaxis  heparin  Code Status: Full  Family Communication: None at bedside  Disposition Plan: Admitted. Will need CIR vs SNF. Continue pain control  Consultants Orthopedic surgery Nephrology Inpatient rehab  Procedures  Left BKA Right 2nd toe amputation   Antibiotics   Anti-infectives    Start     Dose/Rate Route Frequency Ordered Stop   03/11/17 1300  ceFAZolin (ANCEF) IVPB 2g/100 mL premix     2 g 200 mL/hr over 30 Minutes Intravenous To ShortStay Surgical 03/10/17 2148  03/11/17 1614   03/09/17 1200  vancomycin (VANCOCIN) IVPB 1000 mg/200 mL premix  Status:  Discontinued     1,000 mg 200 mL/hr over 60 Minutes Intravenous Every M-W-F (Hemodialysis) 03/08/17 0115 03/12/17 1243   03/08/17 0130  vancomycin (VANCOCIN) 2,000 mg in sodium chloride 0.9 % 500 mL IVPB     2,000 mg 250 mL/hr over 120 Minutes Intravenous  Once 03/08/17 0115 03/08/17 0343   03/08/17 0130  piperacillin-tazobactam (ZOSYN) IVPB 3.375 g     3.375 g 12.5 mL/hr over 240 Minutes Intravenous Every 12 hours 03/08/17 0115 03/13/17 2359   03/08/17 0015  vancomycin (VANCOCIN) IVPB 1000 mg/200 mL premix  Status:  Discontinued     1,000 mg 200 mL/hr over 60 Minutes Intravenous  Once 03/08/17 0001 03/08/17 0111   03/08/17 0015  piperacillin-tazobactam (ZOSYN) IVPB 3.375 g  Status:  Discontinued     3.375 g 100 mL/hr over 30 Minutes Intravenous  Once 03/08/17 0001 03/08/17 0111      Subjective:   [name] seen and examined today. Patient feels pain is better controlled since starting fentanyl patch. Denies chest pain, shortness of breath, abdominal pain, N/V/D/C.   Objective:   Vitals:   03/14/17 1714 03/14/17 1715 03/14/17 2121 03/15/17 0847  BP: (!) 142/75  (!) 172/93 117/64  Pulse: 98  96 93  Resp:  20 19 18   Temp:  99.1 F (37.3 C) 99.8 F (37.7 C) 99.3 F (37.4 C)  TempSrc:  Oral Oral Oral  SpO2:  92% 98% 96%  Weight:   85.4 kg (188 lb 4.4 oz)   Height:        Intake/Output Summary (Last 24 hours) at 03/15/17 1309 Last data filed at 03/15/17 0900  Gross per 24 hour  Intake              840 ml  Output                0 ml  Net              840 ml   Filed Weights   03/14/17 0711 03/14/17 1120 03/14/17 2121  Weight: 87.6 kg (193 lb 2 oz) 85.6 kg (188 lb 11.4 oz) 85.4 kg (188 lb 4.4 oz)   Exam  General: Well developed, well nourished, NAD, appears stated age  HEENT: NCAT, mucous membranes moist.   Neck: Supple, no JVD, no masses  Cardiovascular: S1 S2  auscultated, RRR, 2/6SEM  Respiratory: Clear to auscultation bilaterally, no wheezing   Abdomen: Soft, nontender, nondistended, + bowel sounds  Extremities: Left BKA with wound vac in place. Right foot dressing  Neuro: AAOx3, nonfocal   Psych: Appropriate mood and affect  Data Reviewed: I have personally reviewed following labs and imaging studies  CBC:  Recent Labs Lab 03/11/17 0359 03/12/17 0356 03/13/17 0611 03/14/17 0617 03/15/17 0707  WBC 7.1 7.3 8.5 9.4 8.5  HGB 10.2* 9.0* 8.4* 7.6* 9.4*  HCT 32.4* 28.7* 26.1* 23.2* 28.4*  MCV 74.1* 75.9* 73.7* 72.5* 74.5*  PLT 257 311 297 235 035   Basic Metabolic Panel:  Recent Labs Lab 03/09/17 0719  03/10/17 1110 03/11/17 0359 03/12/17 0356 03/13/17 0611 03/14/17 0617 03/15/17 0707  NA 134*  < >  --  132* 131* 126* 125* 129*  K 4.2  < >  --  4.1 4.3 4.5 4.8  3.9  CL 94*  < >  --  92* 92* 88* 86* 93*  CO2 23  < >  --  25 23 23  21* 24  GLUCOSE 70  < >  --  136* 179* 146* 135* 92  BUN 54*  < >  --  46* 30* 42* 56* 35*  CREATININE 12.38*  < >  --  9.63* 7.11* 8.40* 10.12* 6.96*  CALCIUM 8.8*  < >  --  9.0 8.9 8.9 8.7* 8.8*  MG  --   --  2.3  --   --   --   --   --   PHOS 8.2*  --   --   --   --   --   --   --   < > = values in this interval not displayed. GFR: Estimated Creatinine Clearance: 16.4 mL/min (A) (by C-G formula based on SCr of 6.96 mg/dL (H)). Liver Function Tests:  Recent Labs Lab 03/09/17 0719  ALBUMIN 3.2*   No results for input(s): LIPASE, AMYLASE in the last 168 hours. No results for input(s): AMMONIA in the last 168 hours. Coagulation Profile: No results for input(s): INR, PROTIME in the last 168 hours. Cardiac Enzymes: No results for input(s): CKTOTAL, CKMB, CKMBINDEX, TROPONINI in the last 168 hours. BNP (last 3 results) No results for input(s): PROBNP in the last 8760 hours. HbA1C: No results for input(s): HGBA1C in the last 72 hours. CBG:  Recent Labs Lab 03/14/17 1158 03/14/17 1644  03/14/17 2120 03/15/17 0742 03/15/17 1226  GLUCAP 173* 168* 141* 79 191*   Lipid Profile: No results for input(s): CHOL, HDL, LDLCALC, TRIG, CHOLHDL, LDLDIRECT in the last 72 hours. Thyroid Function Tests: No results for input(s): TSH, T4TOTAL, FREET4, T3FREE, THYROIDAB in the last 72 hours. Anemia Panel: No results for input(s): VITAMINB12, FOLATE, FERRITIN, TIBC, IRON, RETICCTPCT in the last 72 hours. Urine analysis:    Component Value Date/Time   COLORURINE RED BIOCHEMICALS MAY BE AFFECTED BY COLOR (A) 01/29/2010 0939   APPEARANCEUR CLOUDY (A) 01/29/2010 0939   LABSPEC 1.020 01/29/2010 0939   PHURINE 8.0 01/29/2010 0939   GLUCOSEU NEGATIVE 01/29/2010 0939   HGBUR LARGE (A) 01/29/2010 0939   BILIRUBINUR MODERATE (A) 01/29/2010 0939   KETONESUR 15 (A) 01/29/2010 0939   PROTEINUR >300 (A) 01/29/2010 0939   UROBILINOGEN 0.2 01/29/2010 0939   NITRITE POSITIVE (A) 01/29/2010 0939   LEUKOCYTESUR LARGE (A) 01/29/2010 0939   Sepsis Labs: @LABRCNTIP (procalcitonin:4,lacticidven:4)  ) Recent Results (from the past 240 hour(s))  Blood culture (routine x 2)     Status: None   Collection Time: 03/07/17 10:58 PM  Result Value Ref Range Status   Specimen Description BLOOD RIGHT FOREARM  Final   Special Requests IN PEDIATRIC BOTTLE Blood Culture adequate volume  Final   Culture NO GROWTH 5 DAYS  Final   Report Status 03/13/2017 FINAL  Final  Blood culture (routine x 2)     Status: None   Collection Time: 03/07/17 11:04 PM  Result Value Ref Range Status   Specimen Description BLOOD RIGHT HAND  Final   Special Requests   Final    BOTTLES DRAWN AEROBIC AND ANAEROBIC Blood Culture adequate volume   Culture NO GROWTH 5 DAYS  Final   Report Status 03/13/2017 FINAL  Final  MRSA PCR Screening     Status: None   Collection Time: 03/08/17  5:17 PM  Result Value Ref Range Status   MRSA by PCR NEGATIVE NEGATIVE Final  Comment:        The GeneXpert MRSA Assay (FDA approved for NASAL  specimens only), is one component of a comprehensive MRSA colonization surveillance program. It is not intended to diagnose MRSA infection nor to guide or monitor treatment for MRSA infections.   Surgical pcr screen     Status: None   Collection Time: 03/10/17 10:11 PM  Result Value Ref Range Status   MRSA, PCR NEGATIVE NEGATIVE Final   Staphylococcus aureus NEGATIVE NEGATIVE Final    Comment:        The Xpert SA Assay (FDA approved for NASAL specimens in patients over 93 years of age), is one component of a comprehensive surveillance program.  Test performance has been validated by [facility] for patients greater than or equal to 39 year old. It is not intended to diagnose infection nor to guide or monitor treatment.       Radiology Studies: No results found.   Scheduled Meds: . amLODipine  10 mg Oral QHS  . calcitRIOL  2.25 mcg Oral Q M,W,F-HD  . calcium acetate  2,001 mg Oral TID WC  . carvedilol  3.125 mg Oral BID WC  . darbepoetin (ARANESP) injection - DIALYSIS  100 mcg Intravenous Q Mon-HD  . docusate sodium  100 mg Oral BID  . feeding supplement (PRO-STAT SUGAR FREE 64)  30 mL Oral BID  . fentaNYL  12.5 mcg Transdermal Q72H  . heparin  5,000 Units Subcutaneous Q8H  . insulin aspart  0-9 Units Subcutaneous TID WC  . insulin glargine  7 Units Subcutaneous QHS  . lanthanum  2,000 mg Oral TID WC  . multivitamin  1 tablet Oral QHS  . nicotine  21 mg Transdermal Daily  . polyethylene glycol  17 g Oral Daily   Continuous Infusions: . methocarbamol (ROBAXIN)  IV 500 mg (03/11/17 1759)     LOS: 7 days   Time Spent in minutes   30 minutes  Hugh Kamara D.O. on 03/15/2017 at 1:09 PM  Between 7am to 7pm - Pager - [phone]  After 7pm go to [email or web address] - password [email or web address]  And look for the night coverage person covering for me after hours  [facility] Office  [phone]

## 2017-03-15 NOTE — Clinical Social Work Note (Signed)
CSW received consult for SNF placement and will follow-up with patient regarding discharge disposition and facility options.  Devina Bezold Givens, MSW, LCSW Licensed Clinical Social Worker [place] [phone]

## 2017-03-15 NOTE — NC FL2 (Signed)
[place] MEDICAID FL2 LEVEL OF CARE SCREENING TOOL     IDENTIFICATION  Patient Name: [name] Barris Sr. Birthdate: 1976-07-15 Sex: male Admission Date (Current Location): 03/07/2017  [place] and [place] Number:  Kathleen Argue [id number] [place] and Address:  [facility], [place] [address], [place], [place] [address]      Provider Number: [id number]  Attending Physician Name and Address:  Cristal Ford, DO  Relative Name and Phone Number:  [place]ar[place], [place] - [phone]     Current Level of Care: Hospital Recommended Level of Care: [place] Prior Approval Number:    Date Approved/Denied:   PASRR Number: [id number] (Eff. 03/14/17)  Discharge Plan: SNF    Current Diagnoses: Patient Active Problem List   Diagnosis Date Noted  . S/P unilateral BKA (below knee amputation), left ([place])   . Post-operative pain   . Acute blood loss anemia   . Chronic combined systolic and diastolic CHF (congestive heart failure) ([place])   . PVD (peripheral vascular disease) ([place])   . Coronary artery disease involving native coronary artery of native heart without angina pectoris   . Tobacco abuse   . Diabetic foot infection ([place]) 03/08/2017  . Osteomyelitis ([place]) 03/08/2017  . Wound dehiscence   . Diabetes mellitus with complication ([place])   . Dehiscence of amputation stump ([place]) 01/04/2017  . S/P transmetatarsal amputation of foot, left ([place]) 11/16/2016  . Gangrene of right foot ([place]) 08/02/2016  . Achilles tendon contracture, left 08/02/2016  . Anemia of renal disease 08/16/2015  . Wound infection 08/15/2015  . Diabetic ulcer of right great toe ([place]) 08/15/2015  . Pain in the chest   . Elevated troponin   . Essential hypertension   . ESRD on dialysis ([place]) 08/07/2013  . Diabetes ([place]) 08/07/2013  . Chest pain 05/16/2012  . Murmur 05/16/2012  . Renal disorder     Orientation RESPIRATION BLADDER Height & Weight     Self, Time,  Situation, Place  Normal Continent Weight: 188 lb 4.4 oz (85.4 kg) Height:  6\' 2"  (188 cm)  BEHAVIORAL SYMPTOMS/MOOD NEUROLOGICAL BOWEL NUTRITION STATUS      Continent Diet (Renal with 1200 mL fluid restriction)  AMBULATORY STATUS COMMUNICATION OF NEEDS Skin   Total Care (Currently unable to ambulate due to dehiscence of the left metatarsal amputation with gangrenous changes) Verbally Surgical wounds (Due to Transtibial amputation left and Right foot amputation second toe at the MTP joint, with prevena wound vac on left foot)                       Personal Care Assistance Level of Assistance  Bathing, Feeding, Dressing Bathing Assistance: Maximum assistance (Upper body min assist and Lower body mod assist) Feeding assistance: Independent Dressing Assistance: Maximum assistance (Upper body min assist and Lower body mod assist)     Functional Limitations Info  Sight, Hearing, Speech Sight Info: Adequate Hearing Info: Adequate Speech Info: Adequate    SPECIAL CARE FACTORS FREQUENCY  PT (By licensed PT), OT (By licensed OT)     PT Frequency: Evaluation on 7/28 and a minimum of 3X per week therapy recommended OT Frequency: Evaluation on 7/29 and a minimum of 3X per week therapy recommended            Contractures Contractures Info: Not present    Additional Factors Info  Code Status, Allergies Code Status Info: Full Allergies Info: Coconut oil           Current  Medications (03/15/2017):  This is the current hospital active medication list Current Facility-Administered Medications  Medication Dose Route Frequency Provider Last Rate Last Dose  . acetaminophen (TYLENOL) tablet 650 mg  650 mg Oral Q6H PRN [name], MD       Or  . acetaminophen (TYLENOL) suppository 650 mg  650 mg Rectal Q6H PRN [name], MD      . amLODipine (NORVASC) tablet 10 mg  10 mg Oral QHS Ivor Costa, MD   10 mg at 03/14/17 2249  . bisacodyl (DULCOLAX) suppository 10 mg  10 mg Rectal  Daily PRN [name], MD      . calcitRIOL (ROCALTROL) capsule 2.25 mcg  2.25 mcg Oral Q M,W,F-HD Lynnda Child, PA-C   2.25 mcg at 03/14/17 1000  . calcium acetate (PHOSLO) capsule 2,001 mg  2,001 mg Oral TID WC Stovall, Woodfin Ganja, PA-C      . camphor-menthol [facility]) lotion   Topical PRN Cristal Ford, DO      . carvedilol (COREG) tablet 3.125 mg  3.125 mg Oral BID WC Elmarie Shiley, MD   3.125 mg at 03/15/17 0847  . Darbepoetin Alfa (ARANESP) injection 100 mcg  100 mcg Intravenous Q Mon-HD Ernest Haber, PA-C   100 mcg at 03/14/17 1601  . docusate sodium (COLACE) capsule 100 mg  100 mg Oral BID [name], MD   100 mg at 03/14/17 2249  . feeding supplement (PRO-STAT SUGAR FREE 64) liquid 30 mL  30 mL Oral BID Loren Racer, PA-C      . fentaNYL (DURAGESIC - dosed mcg/hr) 12.5 mcg  12.5 mcg Transdermal Q72H Cristal Ford, DO   12.5 mcg at 03/15/17 0229  . heparin injection 5,000 Units  5,000 Units Subcutaneous Q8H Rondel Jumbo, PA-C   5,000 Units at 03/14/17 2250  . hydrALAZINE (APRESOLINE) injection 5 mg  5 mg Intravenous Q2H PRN Ivor Costa, MD   5 mg at 03/09/17 1733  . insulin aspart (novoLOG) injection 0-9 Units  0-9 Units Subcutaneous TID WC Ivor Costa, MD   2 Units at 03/14/17 1718  . insulin glargine (LANTUS) injection 7 Units  7 Units Subcutaneous QHS Ivor Costa, MD   7 Units at 03/14/17 2249  . labetalol (NORMODYNE,TRANDATE) injection 5 mg  5 mg Intravenous Q10 min PRN Roderic Palau, MD   5 mg at 03/11/17 1915  . lanthanum (FOSRENOL) chewable tablet 2,000 mg  2,000 mg Oral TID WC Ivor Costa, MD   2,000 mg at 03/15/17 0847  . magnesium citrate solution 1 Bottle  1 Bottle Oral Once PRN [name], MD      . methocarbamol (ROBAXIN) tablet 500 mg  500 mg Oral Q6H PRN [name], MD   500 mg at 03/13/17 2103   Or  . methocarbamol (ROBAXIN) 500 mg in dextrose 5 % 50 mL IVPB  500 mg Intravenous Q6H PRN [name], MD 110 mL/hr at 03/11/17 1759 500 mg  at 03/11/17 1759  . metoCLOPramide (REGLAN) tablet 5-10 mg  5-10 mg Oral Q8H PRN [name], MD       Or  . metoCLOPramide (REGLAN) injection 5-10 mg  5-10 mg Intravenous Q8H PRN [name], MD   10 mg at 03/12/17 1443  . morphine 2 MG/ML injection 2 mg  2 mg Intravenous Q4H PRN Cristal Ford, DO   2 mg at 03/14/17 2048  . multivitamin (RENA-VIT) tablet 1 tablet  1 tablet Oral QHS  Lynnda Child, PA-C   1 tablet at 03/14/17 2249  . nicotine (NICODERM CQ - dosed in mg/24 hours) patch 21 mg  21 mg Transdermal Daily Ivor Costa, MD   21 mg at 03/12/17 1452  . oxyCODONE (Oxy IR/ROXICODONE) immediate release tablet 5-10 mg  5-10 mg Oral Q3H PRN Cristal Ford, DO   10 mg at 03/14/17 9093  . polyethylene glycol (MIRALAX / GLYCOLAX) packet 17 g  17 g Oral Daily Mikhail, [place], DO      . zolpidem (AMBIEN) tablet 5 mg  5 mg Oral QHS PRN Ivor Costa, MD         Discharge Medications: Please see discharge summary for a list of discharge medications.  Relevant Imaging Results:  Relevant Lab Results:   Additional Information ss#[id number]. Dialysis patient MWF at [facility].  Sable Feil, LCSW

## 2017-03-15 NOTE — Progress Notes (Signed)
Occupational Therapy Treatment Patient Details Name: [name] Sr. MRN: [id number] DOB: Jul 11, 1976 Today's Date: 03/15/2017    History of present illness [name] Harriott Sr. is a 41 y.o. male with medical history significant for ESRD on HD, last performed on 03/07/2017, hypertension, diabetes, combined systolic and diastolic CHF, PVD, CAD, tobacco abuse, and status post amputation of all toes on L foot and , R and great toe on 3/23 with stump revision on 5/29  by [name], presenting with bilateral foot pain and drainage with foul smelling odor, at the amputation site. Osteomyelitis of the foot/dehiscence of the left metatarsal amputation with gangrenous changes; now s/p L BKA, and R second toe amputation   OT comments  Pt progressing towards established goals. Pt performed stand pivot transfer from EOB to recliner with Min Guard A, cues for safety, and RW. Despite pain in residual limb, pt performed LLE exercises seated with tactile and verbal cues. Continue to recommend dc to CIR due to pt's age, diagnosis, and motivation; feel pt would benefit from intensive therapy to optimize pt safety and functional performance to reach Mod I level prior to transitioning home. Will continue to follow acutely to increase pt safety and independence with ADLs and functional mobility.    Follow Up Recommendations  CIR    Equipment Recommendations  3 in 1 bedside commode;Tub/shower bench;Wheelchair (measurements OT) (drop arm)    Recommendations for Other Services      Precautions / Restrictions Precautions Precautions: Fall Precaution Comments: VAC LLE Required Braces or Orthoses: Other Brace/Splint Other Brace/Splint: Post op shoe R foot Restrictions Weight Bearing Restrictions: No RLE Weight Bearing:  (use post op shoe) LLE Weight Bearing: Non weight bearing       Mobility Bed Mobility Overal bed mobility: Needs Assistance Bed Mobility: Supine to Sit     Supine to sit:  Supervision     General bed mobility comments: Used rails (Simultaneous filing. User may not have seen previous data.)  Transfers Overall transfer level: Needs assistance Equipment used: Rolling walker (2 wheeled) Transfers: Lateral/Scoot Transfers (Simultaneous filing. User may not have seen previous data.) Sit to Stand: Min guard (Simultaneous filing. User may not have seen previous data.) Stand pivot transfers: Min guard       General transfer comment: Cues for hand placement    Balance Overall balance assessment: Modified Independent Sitting-balance support: Feet supported Sitting balance-Leahy Scale: Good     Standing balance support: Bilateral upper extremity supported Standing balance-Leahy Scale: Fair                             ADL either performed or assessed with clinical judgement   ADL Overall ADL's : Needs assistance/impaired Eating/Feeding: Set up;Sitting Eating/Feeding Details (indicate cue type and reason): Pt set up for eating lunch in recliner                     Toilet Transfer: Min guard;Stand-pivot;RW (Simulated to recliner)             General ADL Comments: Pt performes stand pivot transfer from EOB to recliner with Min gaurd A demosntrating increase sitting and standing balance. Feel pt could handle further intensive therapy to increase pt safety with functional mobility and future prostethic     Vision       Perception     Praxis      Cognition Arousal/Alertness: Awake/alert Behavior During Therapy: [facility] for tasks assessed/performed  Overall Cognitive Status: Within Functional Limits for tasks assessed                                          Exercises Exercises: Amputee Amputee Exercises Hip ABduction/ADduction: AROM;Strengthening;Left;5 reps;Seated Knee Flexion: AROM;Strengthening;Left;Seated;10 reps  Knee Extension: AROM;10 reps;Left;Seated;Strengthening   Shoulder Instructions        General Comments      Pertinent Vitals/ Pain       Pain Assessment: Faces Pain Score: 8  Faces Pain Scale: Hurts worst (Simultaneous filing. User may not have seen previous data.) Pain Location: L residual limb Pain Descriptors / Indicators: Aching;Constant Pain Intervention(s): Monitored during session;Repositioned  Home Living                                          Prior Functioning/Environment              Frequency  Min 3X/week        Progress Toward Goals  OT Goals(current goals can now be found in the care plan section)  Progress towards OT goals: Progressing toward goals  Acute Rehab OT Goals Patient Stated Goal: to walk; and to keep his sons OT Goal Formulation: With patient Time For Goal Achievement: 03/27/17 Potential to Achieve Goals: Good ADL Goals Pt Will Perform Lower Body Bathing: sitting/lateral leans;with modified independence Pt Will Perform Lower Body Dressing: sitting/lateral leans;with modified independence Pt Will Transfer to Toilet: with modified independence;bedside commode;squat pivot transfer Pt Will Perform Toileting - Clothing Manipulation and hygiene: with modified independence;sitting/lateral leans Additional ADL Goal #1: Pt will independently verbalize understanding of desensitization techniqeus for L residual limb to reduce pain and phantom sensation  Plan Discharge plan remains appropriate    Co-evaluation                 AM-PAC PT "6 Clicks" Daily Activity     Outcome Measure   Help from another person eating meals?: None Help from another person taking care of personal grooming?: None Help from another person toileting, which includes using toliet, bedpan, or urinal?: A Little Help from another person bathing (including washing, rinsing, drying)?: A Lot Help from another person to put on and taking off regular upper body clothing?: A Little Help from another person to put on and taking off regular  lower body clothing?: A Lot 6 Click Score: 18    End of Session Equipment Utilized During Treatment: Rolling walker  OT Visit Diagnosis: Other abnormalities of gait and mobility (R26.89);Pain;Muscle weakness (generalized) (M62.81) Pain - Right/Left: Left Pain - part of body: Leg   Activity Tolerance Patient tolerated treatment well   Patient Left in chair;with call bell/phone within reach   Nurse Communication Mobility status        Time: 4034-7425 OT Time Calculation (min): 14 min  Charges: OT General Charges $OT Visit: 1 Procedure OT Treatments $Self Care/Home Management : 8-22 mins  [place], OTR/L Acute Rehab Pager: [phone] Office: [place] 03/15/2017, 4:44 PM

## 2017-03-15 NOTE — Progress Notes (Signed)
[facility] Progress Note   Subjective:  Seen in room. Denies CP/dyspnea. Apparently, fentanyl patch fell off last night and pain spiked, but now replaced and pain much improved.   Objective Vitals:   03/14/17 1714 03/14/17 1715 03/14/17 2121 03/15/17 0847  BP: (!) 142/75  (!) 172/93 117/64  Pulse: 98  96 93  Resp:  20 19   Temp:  99.1 F (37.3 C) 99.8 F (37.7 C)   TempSrc:  Oral Oral   SpO2:  92% 98%   Weight:   85.4 kg (188 lb 4.4 oz)   Height:       Physical Exam General: Well appearing, NAD Heart: RRR; no murmur Lungs: CTAB Extremities: L BKA with wound vac, R foot bandaged. No edema Dialysis Access: AVF + thrill  Additional Objective Labs: Basic Metabolic Panel:  Recent Labs Lab 03/09/17 0719  03/13/17 0611 03/14/17 0617 03/15/17 0707  NA 134*  < > 126* 125* 129*  K 4.2  < > 4.5 4.8 3.9  CL 94*  < > 88* 86* 93*  CO2 23  < > 23 21* 24  GLUCOSE 70  < > 146* 135* 92  BUN 54*  < > 42* 56* 35*  CREATININE 12.38*  < > 8.40* 10.12* 6.96*  CALCIUM 8.8*  < > 8.9 8.7* 8.8*  PHOS 8.2*  --   --   --   --   < > = values in this interval not displayed. Liver Function Tests:  Recent Labs Lab 03/09/17 0719  ALBUMIN 3.2*   CBC:  Recent Labs Lab 03/11/17 0359 03/12/17 0356 03/13/17 0611 03/14/17 0617 03/15/17 0707  WBC 7.1 7.3 8.5 9.4 8.5  HGB 10.2* 9.0* 8.4* 7.6* 9.4*  HCT 32.4* 28.7* 26.1* 23.2* 28.4*  MCV 74.1* 75.9* 73.7* 72.5* 74.5*  PLT 257 311 297 235 252   CBG:  Recent Labs Lab 03/13/17 2133 03/14/17 1158 03/14/17 1644 03/14/17 2120 03/15/17 0742  GLUCAP 158* 173* 168* 141* 79   Medications: . methocarbamol (ROBAXIN)  IV 500 mg (03/11/17 1759)   . amLODipine  10 mg Oral QHS  . calcitRIOL  2.25 mcg Oral Q M,W,F-HD  . carvedilol  3.125 mg Oral BID WC  . darbepoetin (ARANESP) injection - DIALYSIS  100 mcg Intravenous Q Mon-HD  . docusate sodium  100 mg Oral BID  . fentaNYL  12.5 mcg Transdermal Q72H  . heparin  5,000  Units Subcutaneous Q8H  . insulin aspart  0-9 Units Subcutaneous TID WC  . insulin glargine  7 Units Subcutaneous QHS  . lanthanum  2,000 mg Oral TID WC  . multivitamin  1 tablet Oral QHS  . nicotine  21 mg Transdermal Daily  . polyethylene glycol  17 g Oral Daily   Dialysis Orders: [facility] MWF  4h 500/800 2/2 bath P2 80.5kg LUA AVF Hep 8000 Venofer 100 mg IV x10 (until 8/10) Calcitriol 2.25 mcg PO q HD BMM: Fosrenol 1000 2 tabs TID/ Ca acetate 3 tabs TID  Assessment/Plan: 1. 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 + wound vac in place. Per ortho. Plan is for CIR v. SNF for rehab. 2. ESRD: Continue MWF schedule, next due 8/1. 3. Hypertension/volume: BP improved on current meds (amlodipine/new coreg), but still above EDW (despite amputation), will try to get more fluid off with next HD if tolerated. 4. Anemia: S/p 2U PRBCs 7/30, Hgb up to 9.4 today. Continue Aranesp 100mg  weekly. 5. Metabolic bone  disease: Ca ok, last Phos high. Continue calcitriol and Fosrenol, will add back home Phoslo. 6. Nutrition: Alb 3.2. Will add pro-stat supps. 7. DM: On insulin, per primary. 8. 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  Veneta Penton, PA-C 03/15/2017, 10:08 AM  [facility] Pager: [phone]

## 2017-03-15 NOTE — Progress Notes (Signed)
Pt called to ask for assistance. Pt reported that his R arm fentanyl patch is no longer in place. Pt's skin checked head to toe, as well as bed and linens. No patch found. Call placed to pharmacy for rescheduling. Dorthey Sawyer, RN

## 2017-03-16 LAB — CBC
HEMATOCRIT: 28.5 % — AB (ref 39.0–52.0)
Hemoglobin: 9.5 g/dL — ABNORMAL LOW (ref 13.0–17.0)
MCH: 24.9 pg — AB (ref 26.0–34.0)
MCHC: 33.3 g/dL (ref 30.0–36.0)
MCV: 74.6 fL — AB (ref 78.0–100.0)
PLATELETS: 316 10*3/uL (ref 150–400)
RBC: 3.82 MIL/uL — ABNORMAL LOW (ref 4.22–5.81)
RDW: 20.2 % — AB (ref 11.5–15.5)
WBC: 9.1 10*3/uL (ref 4.0–10.5)

## 2017-03-16 LAB — BASIC METABOLIC PANEL
Anion gap: 14 (ref 5–15)
BUN: 47 mg/dL — AB (ref 6–20)
CALCIUM: 8.8 mg/dL — AB (ref 8.9–10.3)
CO2: 24 mmol/L (ref 22–32)
Chloride: 91 mmol/L — ABNORMAL LOW (ref 101–111)
Creatinine, Ser: 8.28 mg/dL — ABNORMAL HIGH (ref 0.61–1.24)
GFR calc Af Amer: 8 mL/min — ABNORMAL LOW (ref >60)
GFR, EST NON AFRICAN AMERICAN: 7 mL/min — AB (ref >60)
GLUCOSE: 133 mg/dL — AB (ref 65–99)
POTASSIUM: 3.9 mmol/L (ref 3.5–5.1)
Sodium: 129 mmol/L — ABNORMAL LOW (ref 135–145)

## 2017-03-16 LAB — IRON AND TIBC
Iron: 27 ug/dL — ABNORMAL LOW (ref 45–182)
SATURATION RATIOS: 14 % — AB (ref 17.9–39.5)
TIBC: 188 ug/dL — ABNORMAL LOW (ref 250–450)
UIBC: 161 ug/dL

## 2017-03-16 LAB — GLUCOSE, CAPILLARY
GLUCOSE-CAPILLARY: 185 mg/dL — AB (ref 65–99)
GLUCOSE-CAPILLARY: 117 mg/dL — AB (ref 65–99)
Glucose-Capillary: 189 mg/dL — ABNORMAL HIGH (ref 65–99)

## 2017-03-16 LAB — FERRITIN: FERRITIN: 164 ng/mL (ref 24–336)

## 2017-03-16 MED ORDER — OXYCODONE HCL 5 MG PO TABS
ORAL_TABLET | ORAL | Status: AC
Start: 2017-03-16 — End: 2017-03-16
  Filled 2017-03-16: qty 2

## 2017-03-16 NOTE — Progress Notes (Signed)
[facility]                                                                              Patient Demographics  [name], is a 41 y.o. male, DOB - March 16, 1976, SUP:[id number]  Admit date - 03/07/2017   Admitting Physician Waldemar Dickens, MD  Outpatient Primary MD for the patient is Patient, [name] Per  Outpatient specialists:   LOS - 8  days   Medical records reviewed and are as summarized below:    Chief Complaint  Patient presents with  . Foot Pain       Brief summary   [name] Francois a 41 y.o.malewith medical history significant for ESRD on HD, last performedon 03/07/2017, hypertension, diabetes, combined systolic and diastolic CHF, PVD, CAD, tobacco abuse, and status post amputation of all toes on L foot and , R and great toe on 3/23 with stump revision on 5/29by [name] presented with bilateral foot pain and drainage with foul smelling odor at the amputation site. Patient had been on doxycycline for one month. Patient was admitted with osteomyelitis, underwent left BKA and right second toe amputation.   Assessment & Plan   Osteomyelitis of the foot, dehiscence of the left metatarsal amputation with gangrenous changes in the setting of PVD, diabetes - Status post transmetatarsal amputation of the left on 3/23, stump revision on 5/29 after dehiscence of transmetatarsal amputation of left foot, right second 2nd toe  - Orthopedics was consulted, patient underwent trans tibial amputation of the left, right foot amputation second toe at the MTP joint, application of the Prevena wound vac left on 7/27 - Blood cultures showed no growth, antibiotics have been discontinued 48 hours postop - Discussed with CIR today, recommended skilled nursing facility placement - Continue pain control  ESRD - Continue dialysis MWF, renal following   Nonsustained V. Tach - Currently still stable, keep potassium~4, magnesium~2  Chronic combined  systolic and diastolic CHF - 2-D echo 12/9290 showed EF of 44-62%, grade 2 diastolic dysfunction - Currently euvolemic, volume control with hemodialysis  Essential hypertension - Continue amlodipine, low-dose Coreg - IV hydralazine as needed  Diabetes mellitus type II - CBGs controlled, continue Lantus, sliding scale insulin, - Hemoglobin A1c 6.1  Anemia of chronic disease  - Hemoglobin dropped to 7.6 on 7/30 likely due to surgery postop, was transfused 2 units, currently H&H stable  Tobacco abuse - Continue nicotine patch  Chronic hyponatremia Currently stable, continue to monitor  Code Status: Full code DVT Prophylaxis:  Heparin Family Communication: Discussed in detail with the patient, all imaging results, lab results explained to the patient  Disposition Plan: When skilled nursing bed is available  Time Spent in minutes   35 minutes  Procedures:  Left BKA Right second toe" amp  Consultants:   Orthopedic surgery Nephrology CIR  Antimicrobials:      Medications  Scheduled Meds: . amLODipine  10 mg Oral QHS  . calcitRIOL  2.25 mcg Oral Q M,W,F-HD  . calcium acetate  2,001 mg Oral TID WC  . carvedilol  3.125 mg Oral BID WC  . darbepoetin (ARANESP) injection - DIALYSIS  100 mcg Intravenous Q Mon-HD  . docusate sodium  100 mg Oral BID  . feeding supplement (PRO-STAT SUGAR FREE 64)  30 mL Oral BID  . fentaNYL  12.5 mcg Transdermal Q72H  . heparin  5,000 Units Subcutaneous Q8H  . insulin aspart  0-9 Units Subcutaneous TID WC  . insulin glargine  7 Units Subcutaneous QHS  . lanthanum  2,000 mg Oral TID WC  . multivitamin  1 tablet Oral QHS  . nicotine  21 mg Transdermal Daily  . polyethylene glycol  17 g Oral Daily   Continuous Infusions: . methocarbamol (ROBAXIN)  IV 500 mg (03/11/17 1759)   PRN Meds:.acetaminophen **OR** acetaminophen, bisacodyl, camphor-menthol, hydrALAZINE, labetalol, magnesium citrate, methocarbamol **OR** methocarbamol (ROBAXIN)  IV,  metoCLOPramide **OR** metoCLOPramide (REGLAN) injection, morphine injection, oxyCODONE, zolpidem   Antibiotics   Anti-infectives    Start     Dose/Rate Route Frequency Ordered Stop   03/11/17 1300  ceFAZolin (ANCEF) IVPB 2g/100 mL premix     2 g 200 mL/hr over 30 Minutes Intravenous To [facility]Stay [facility] 03/10/17 2148 03/11/17 1614   03/09/17 1200  vancomycin (VANCOCIN) IVPB 1000 mg/200 mL premix  Status:  Discontinued     1,000 mg 200 mL/hr over 60 Minutes Intravenous Every M-W-F (Hemodialysis) 03/08/17 0115 03/12/17 1243   03/08/17 0130  vancomycin (VANCOCIN) 2,000 mg in sodium chloride 0.9 % 500 mL IVPB     2,000 mg 250 mL/hr over 120 Minutes Intravenous  Once 03/08/17 0115 03/08/17 0343   03/08/17 0130  piperacillin-tazobactam (ZOSYN) IVPB 3.375 g     3.375 g 12.5 mL/hr over 240 Minutes Intravenous Every 12 hours 03/08/17 0115 03/13/17 2359   03/08/17 0015  vancomycin (VANCOCIN) IVPB 1000 mg/200 mL premix  Status:  Discontinued     1,000 mg 200 mL/hr over 60 Minutes Intravenous  Once 03/08/17 0001 03/08/17 0111   03/08/17 0015  piperacillin-tazobactam (ZOSYN) IVPB 3.375 g  Status:  Discontinued     3.375 g 100 mL/hr over 30 Minutes Intravenous  Once 03/08/17 0001 03/08/17 0111        Subjective:   [name] was seen and examined today.  The pain currently controlled, denies any specific complaints, awaiting facility. Patient denies dizziness, chest pain, shortness of breath, abdominal pain, N/V/D/C, new weakness, numbess, tingling. No acute events overnight.    Objective:   Vitals:   03/15/17 1730 03/15/17 2104 03/16/17 0624 03/16/17 0900  BP: (!) 172/98 (!) 155/90 134/88 (!) 159/86  Pulse: 92 95 88 91  Resp: 18 19 18 18   Temp:  98.3 F (36.8 C) 98.1 F (36.7 C) 98.8 F (37.1 C)  TempSrc:  Oral Oral Oral  SpO2: 100% 100% 99% 95%  Weight:  85.1 kg (187 lb 9.8 oz)    Height:        Intake/Output Summary (Last 24 hours) at 03/16/17 1251 Last data filed at  03/16/17 0900  Gross per 24 hour  Intake              720 ml  Output                0 ml  Net              720 ml     Wt Readings from Last 3 Encounters:  03/15/17 85.1 kg (187 lb 9.8 oz)  01/11/17 86.2 kg (190 lb)  01/04/17 86.2 kg (190 lb)     Exam  General: Alert and oriented x 3, NAD  Eyes: PERRLA, EOMI, Anicteric Sclera,  HEENT:  Atraumatic, normocephalic, normal oropharynx  Cardiovascular: S1 S2 auscultated, no rubs, murmurs or gallops. Regular rate and rhythm.  Respiratory: Clear to auscultation bilaterally, no wheezing, rales or rhonchi  Gastrointestinal: Soft, nontender, nondistended, + bowel sounds  Ext: Left BKA with wound VAC in place, right foot dressing intact  Neuro: No new deficits   Musculoskeletal: No digital cyanosis, clubbing  Skin: No rashes  Psych: Normal affect and demeanor, alert and oriented x3    Data Reviewed:  I have personally reviewed following labs and imaging studies  Micro Results Recent Results (from the past 240 hour(s))  Blood culture (routine x 2)     Status: None   Collection Time: 03/07/17 10:58 PM  Result Value Ref Range Status   Specimen Description BLOOD RIGHT FOREARM  Final   Special Requests IN PEDIATRIC BOTTLE Blood Culture adequate volume  Final   Culture NO GROWTH 5 DAYS  Final   Report Status 03/13/2017 FINAL  Final  Blood culture (routine x 2)     Status: None   Collection Time: 03/07/17 11:04 PM  Result Value Ref Range Status   Specimen Description BLOOD RIGHT HAND  Final   Special Requests   Final    BOTTLES DRAWN AEROBIC AND ANAEROBIC Blood Culture adequate volume   Culture NO GROWTH 5 DAYS  Final   Report Status 03/13/2017 FINAL  Final  MRSA PCR Screening     Status: None   Collection Time: 03/08/17  5:17 PM  Result Value Ref Range Status   MRSA by PCR NEGATIVE NEGATIVE Final    Comment:        The GeneXpert MRSA Assay (FDA approved for NASAL specimens only), is one component of a comprehensive  MRSA colonization surveillance program. It is not intended to diagnose MRSA infection nor to guide or monitor treatment for MRSA infections.   Surgical pcr screen     Status: None   Collection Time: 03/10/17 10:11 PM  Result Value Ref Range Status   MRSA, PCR NEGATIVE NEGATIVE Final   Staphylococcus aureus NEGATIVE NEGATIVE Final    Comment:        The Xpert SA Assay (FDA approved for NASAL specimens in patients over 57 years of age), is one component of a comprehensive surveillance program.  Test performance has been validated by [facility] for patients greater than or equal to 39 year old. It is not intended to diagnose infection nor to guide or monitor treatment.     Radiology Reports Dg Foot Complete Left  Result Date: 03/07/2017 CLINICAL DATA:  Diabetic foot ulcer EXAM: LEFT FOOT - COMPLETE 3+ VIEW COMPARISON:  09/11/2016 FINDINGS: There is been interval transmetatarsal amputation performed. Vascular calcifications are seen. A large soft tissue wound is noted along the stump. Some lucency is noted within the residual first and second metatarsals suggestive of osteomyelitis. No other focal abnormality is seen. IMPRESSION: Large soft tissue wound with changes of erosion in the first and second metatarsal remnants suggestive of osteomyelitis. Electronically Signed   By: Inez Catalina M.D.   On: 03/07/2017 22:46   Dg Foot Complete Right  Result Date: 03/07/2017 CLINICAL DATA:  Diabetic foot ulcer with second toe pain, initial encounter EXAM: RIGHT FOOT COMPLETE - 3+ VIEW COMPARISON:  02/11/2017 FINDINGS: First toe amputation is again identified. Diffuse vascular calcifications are seen. The diabetic foot wound is not well appreciated on this exam. No bony erosion to suggest osteomyelitis is noted. IMPRESSION: Prior surgical  change.  No definitive osteomyelitis is noted. Electronically Signed   By: Inez Catalina M.D.   On: 03/07/2017 22:47    Lab Data:  CBC:  Recent Labs Lab  03/12/17 0356 03/13/17 8453 03/14/17 0617 03/15/17 0707 03/16/17 0411  WBC 7.3 8.5 9.4 8.5 9.1  HGB 9.0* 8.4* 7.6* 9.4* 9.5*  HCT 28.7* 26.1* 23.2* 28.4* 28.5*  MCV 75.9* 73.7* 72.5* 74.5* 74.6*  PLT 311 297 235 252 646   Basic Metabolic Panel:  Recent Labs Lab 03/10/17 1110  03/12/17 0356 03/13/17 0611 03/14/17 0617 03/15/17 0707 03/16/17 0411  NA  --   < > 131* 126* 125* 129* 129*  K  --   < > 4.3 4.5 4.8 3.9 3.9  CL  --   < > 92* 88* 86* 93* 91*  CO2  --   < > 23 23 21* 24 24  GLUCOSE  --   < > 179* 146* 135* 92 133*  BUN  --   < > 30* 42* 56* 35* 47*  CREATININE  --   < > 7.11* 8.40* 10.12* 6.96* 8.28*  CALCIUM  --   < > 8.9 8.9 8.7* 8.8* 8.8*  MG 2.3  --   --   --   --   --   --   < > = values in this interval not displayed. GFR: Estimated Creatinine Clearance: 13.8 mL/min (A) (by C-G formula based on SCr of 8.28 mg/dL (H)). Liver Function Tests: No results for input(s): AST, ALT, ALKPHOS, BILITOT, PROT, ALBUMIN in the last 168 hours. No results for input(s): LIPASE, AMYLASE in the last 168 hours. No results for input(s): AMMONIA in the last 168 hours. Coagulation Profile: No results for input(s): INR, PROTIME in the last 168 hours. Cardiac Enzymes: No results for input(s): CKTOTAL, CKMB, CKMBINDEX, TROPONINI in the last 168 hours. BNP (last 3 results) No results for input(s): PROBNP in the last 8760 hours. HbA1C: No results for input(s): HGBA1C in the last 72 hours. CBG:  Recent Labs Lab 03/15/17 1226 03/15/17 1749 03/15/17 2053 03/16/17 0728 03/16/17 1137  GLUCAP 191* 159* 184* 189* 185*   Lipid Profile: No results for input(s): CHOL, HDL, LDLCALC, TRIG, CHOLHDL, LDLDIRECT in the last 72 hours. Thyroid Function Tests: No results for input(s): TSH, T4TOTAL, FREET4, T3FREE, THYROIDAB in the last 72 hours. Anemia Panel: No results for input(s): VITAMINB12, FOLATE, FERRITIN, TIBC, IRON, RETICCTPCT in the last 72 hours. Urine analysis:    Component  Value Date/Time   COLORURINE RED BIOCHEMICALS MAY BE AFFECTED BY COLOR (A) 01/29/2010 0939   APPEARANCEUR CLOUDY (A) 01/29/2010 0939   LABSPEC 1.020 01/29/2010 0939   PHURINE 8.0 01/29/2010 0939   GLUCOSEU NEGATIVE 01/29/2010 0939   HGBUR LARGE (A) 01/29/2010 0939   BILIRUBINUR MODERATE (A) 01/29/2010 0939   KETONESUR 15 (A) 01/29/2010 0939   PROTEINUR >300 (A) 01/29/2010 0939   UROBILINOGEN 0.2 01/29/2010 0939   NITRITE POSITIVE (A) 01/29/2010 0939   LEUKOCYTESUR LARGE (A) 01/29/2010 0939     [name] M.D. [facility] 03/16/2017, 12:51 PM  Pager: [phone] Between 7am to 7pm - call Pager - [phone]  After 7pm go to [email or web address] - password [email or web address]  Call night coverage person covering after 7pm

## 2017-03-16 NOTE — Procedures (Signed)
Patient seen on Hemodialysis. QB 400, UF goal 5L Treatment adjusted as needed.  Elmarie Shiley MD [facility]. Office # [phone] Pager # [phone] 3:20 PM

## 2017-03-16 NOTE — Progress Notes (Signed)
[facility] Progress Note   Subjective:  Seen in room, eating breakfast. Pain is better controlled, but says he did not sleep much overnight. Denies CP or dyspnea. Plan is for HD later today.  Objective Vitals:   03/15/17 1730 03/15/17 2104 03/16/17 0624 03/16/17 0900  BP: (!) 172/98 (!) 155/90 134/88 (!) 159/86  Pulse: 92 95 88 91  Resp: 18 19 18 18   Temp:  98.3 F (36.8 C) 98.1 F (36.7 C) 98.8 F (37.1 C)  TempSrc:  Oral Oral Oral  SpO2: 100% 100% 99% 95%  Weight:  85.1 kg (187 lb 9.8 oz)    Height:       Physical Exam General: Well appearing, NAD Heart: RRR; no murmur Lungs: CTAB Extremities: L BKA with wound vac, R foot bandaged. No edema Dialysis Access: AVF + thrill  Additional Objective Labs: Basic Metabolic Panel:  Recent Labs Lab 03/14/17 0617 03/15/17 0707 03/16/17 0411  NA 125* 129* 129*  K 4.8 3.9 3.9  CL 86* 93* 91*  CO2 21* 24 24  GLUCOSE 135* 92 133*  BUN 56* 35* 47*  CREATININE 10.12* 6.96* 8.28*  CALCIUM 8.7* 8.8* 8.8*   CBC:  Recent Labs Lab 03/12/17 0356 03/13/17 0611 03/14/17 0617 03/15/17 0707 03/16/17 0411  WBC 7.3 8.5 9.4 8.5 9.1  HGB 9.0* 8.4* 7.6* 9.4* 9.5*  HCT 28.7* 26.1* 23.2* 28.4* 28.5*  MCV 75.9* 73.7* 72.5* 74.5* 74.6*  PLT 311 297 235 252 316   CBG:  Recent Labs Lab 03/15/17 0742 03/15/17 1226 03/15/17 1749 03/15/17 2053 03/16/17 0728  GLUCAP 79 191* 159* 184* 189*   Medications: . methocarbamol (ROBAXIN)  IV 500 mg (03/11/17 1759)   . amLODipine  10 mg Oral QHS  . calcitRIOL  2.25 mcg Oral Q M,W,F-HD  . calcium acetate  2,001 mg Oral TID WC  . carvedilol  3.125 mg Oral BID WC  . darbepoetin (ARANESP) injection - DIALYSIS  100 mcg Intravenous Q Mon-HD  . docusate sodium  100 mg Oral BID  . feeding supplement (PRO-STAT SUGAR FREE 64)  30 mL Oral BID  . fentaNYL  12.5 mcg Transdermal Q72H  . heparin  5,000 Units Subcutaneous Q8H  . insulin aspart  0-9 Units Subcutaneous TID WC  .  insulin glargine  7 Units Subcutaneous QHS  . lanthanum  2,000 mg Oral TID WC  . multivitamin  1 tablet Oral QHS  . nicotine  21 mg Transdermal Daily  . polyethylene glycol  17 g Oral Daily    Dialysis Orders: [facility] MWF  4h 500/800 2/2 bath P2 80.5kg LUA AVF Hep 8000 Venofer 100 mg IV x10 (until 8/10) Calcitriol 2.25 mcg PO q HD BMM: Fosrenol 1000 2 tabs TID/ Ca acetate 3 tabs TID  Assessment/Plan: 1. 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 + wound vac in place. Per ortho. Plan is for CIR v. SNF for rehab. 2. ESRD: Continue MWF schedule, next due 8/1 (today). 3. Hypertension/volume: BP improved on current meds (amlodipine/new coreg), but still above EDW (despite amputation), will try to get more fluid off with next HD if tolerated. 4. Anemia: S/p 2U PRBCs 7/30, Hgb up to 9.5 today. Continue Aranesp 100mg  weekly. 5. Metabolic bone disease: Ca ok, last Phos high. Continue calcitriol and Fosrenol, adding back home Phoslo. 6. Nutrition: Alb 3.2. Adding pro-stat supps. 7. DM: On insulin, per primary. 8. 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  Veneta Penton, PA-C 03/16/2017,  10:17 AM  [facility] Pager: [phone]

## 2017-03-16 NOTE — Clinical Social Work Note (Addendum)
CSW continuing to work on placement for patient. [place] with [place] facilities ([place] and [facility]), visited with and talked to patient and CSW afterwards informed that they cannot accept patient due to drug history. Contact made with admissions director at [facility] regarding patient and at 4:42 pm, CSW contacted by Ms. Holsey and informed that SNF administration want her to talk to patient before making a final determination that they can accept patient [name] the only facility to make a bed offer). CSW will continue to follow and talk with admissions director with [name] on 8/2 after her visit with patient. Mr. Lamping informed of the visit.  Visited dialysis center at 5:57 pm to talk with patient, however he was asleep. CSW will talk with patient Thursday morning.   Idalee Foxworthy Givens, MSW, LCSW Licensed Clinical Social Worker [place] [phone]

## 2017-03-17 LAB — GLUCOSE, CAPILLARY
GLUCOSE-CAPILLARY: 136 mg/dL — AB (ref 65–99)
Glucose-Capillary: 103 mg/dL — ABNORMAL HIGH (ref 65–99)

## 2017-03-17 MED ORDER — FENTANYL 12 MCG/HR TD PT72: 12.5000 ug | MEDICATED_PATCH | TRANSDERMAL | 0 refills | Status: DC

## 2017-03-17 MED ORDER — SODIUM CHLORIDE 0.9 % IV SOLN: 125.0000 mg | INTRAVENOUS | Status: DC

## 2017-03-17 MED ORDER — CALCITRIOL 0.25 MCG PO CAPS: 2.2500 ug | ORAL_CAPSULE | ORAL | Status: DC

## 2017-03-17 MED ORDER — DOCUSATE SODIUM 100 MG PO CAPS: 100.0000 mg | ORAL_CAPSULE | Freq: Two times a day (BID) | ORAL | 0 refills | Status: DC

## 2017-03-17 MED ORDER — OXYCODONE HCL 5 MG PO TABS: 5.0000 mg | ORAL_TABLET | Freq: Four times a day (QID) | ORAL | 0 refills | Status: DC | PRN

## 2017-03-17 MED ORDER — INSULIN GLARGINE 100 UNIT/ML ~~LOC~~ SOLN: 7.0000 [IU] | Freq: Every day | SUBCUTANEOUS | 12 refills | Status: DC

## 2017-03-17 MED ORDER — CALCIUM ACETATE (PHOS BINDER) 667 MG PO CAPS: 2001.0000 mg | ORAL_CAPSULE | Freq: Three times a day (TID) | ORAL | Status: DC

## 2017-03-17 MED ORDER — CARVEDILOL 6.25 MG PO TABS: 6.2500 mg | ORAL_TABLET | Freq: Two times a day (BID) | ORAL | Status: DC

## 2017-03-17 MED ORDER — PRO-STAT SUGAR FREE PO LIQD: 30.0000 mL | Freq: Two times a day (BID) | ORAL | 0 refills | Status: DC

## 2017-03-17 MED ORDER — NICOTINE 21 MG/24HR TD PT24: 21.0000 mg | MEDICATED_PATCH | Freq: Every day | TRANSDERMAL | 0 refills | Status: DC

## 2017-03-17 MED ORDER — OXYCODONE HCL 5 MG PO TABS: 5.0000 mg | ORAL_TABLET | ORAL | 0 refills | Status: DC | PRN

## 2017-03-17 MED ORDER — RENA-VITE PO TABS: 1.0000 | ORAL_TABLET | Freq: Every day | ORAL | 0 refills | Status: DC

## 2017-03-17 MED ORDER — INSULIN ASPART 100 UNIT/ML ~~LOC~~ SOLN: SUBCUTANEOUS | 11 refills | Status: DC

## 2017-03-17 MED ORDER — POLYETHYLENE GLYCOL 3350 17 G PO PACK: 17.0000 g | PACK | Freq: Every day | ORAL | 0 refills | Status: DC

## 2017-03-17 MED ORDER — METHOCARBAMOL 500 MG PO TABS: 500.0000 mg | ORAL_TABLET | Freq: Four times a day (QID) | ORAL | Status: DC | PRN

## 2017-03-17 NOTE — Discharge Summary (Signed)
Pt given discharge instructions, prescriptions and follow up info. Report called to [place], Therapist, sports. Denies questions. Pt left with belongings and stump compressor.

## 2017-03-17 NOTE — Progress Notes (Signed)
[facility] Progress Note Patient Details:  [name] Palinkas Sr. 1976/01/07 [id number]  Patient ID: [name] Bible., male   DOB: 04-Feb-1976, 41 y.o.   MRN: [id number]   [name] 03/17/2017, 11:44 AMCalled [employer] for stump shrinker.

## 2017-03-17 NOTE — Progress Notes (Signed)
PT Cancellation Note  Patient Details Name: [name] Powe Sr. MRN: [id number] DOB: May 12, 1976   Cancelled Treatment:    Reason Eval/Treat Not Completed: Attempted PT tx session. Pt politely declined. Pt reports the plan is for d/c to SNF today.    Weston Anna, MPT Pager: [phone]

## 2017-03-17 NOTE — Progress Notes (Signed)
Rehab admissions - Currently rehab beds are full with very limited inpatient rehab bed availability this week.  Call me for questions.  #[phone]

## 2017-03-17 NOTE — Clinical Social Work Note (Signed)
Clinical Social Work Assessment  Patient Details  Name: [name] Hollerbach Sr. MRN: [id number] Date of Birth: [birth year]  Date of referral:  03/12/17               Reason for consult:  Facility Placement                Permission sought to share information with:  Family Supports Permission granted to share information::  No  Name::        Agency::     Relationship::     Contact Information:     Housing/Transportation Living arrangements for the past 2 months:  Apartment (Had been living with his girlfriend.) Source of Information:  Patient Patient Interpreter Needed:  None Criminal Activity/Legal Involvement Pertinent to Current Situation/Hospitalization:  No - Comment as needed Significant Relationships:  Significant Other, Siblings Lives with:  Significant Other (Prior to hospitalization, patient was living with g'friend [name]) Do you feel safe going back to the place where you live?  Yes (Patient feels safe at g'friends home, but can't return there as g'friend in [place] and patient not on the lease.) Need for family participation in patient care:  No (Coment)  Care giving concerns:  Patient expressed concerns about his housing situation as he cannot return to his girlfriend's home, as she lives in subsidized housing and he is not on the list. His concerns were discussed and housing options given.  Social Worker assessment / plan:  CSW talked with patient on 8/1 regarding his discharge disposition and recommendation of skilled nursing due to his surgeries and having a wound vac on his left BKA. Patient was lying in bed and was awake and, and agreeable to talking with CSW.  Patient is concerned about his housing situation because he cannot return to his g'friend's home and shelters resources discussed. Patient did not indicate that he had family or other friends that could assist him with housing.  Mr.  [name] expressed agreement with going to a skilled facility for  continued medical care (he receives Medicaid). Patient was also agreeable to talking with staff from [name] regarding possible admission to one of their SNF's as [name] is not too far from his dialysis center.  [name] was advised that [name] was the only one that accepted him in our system and that wherever he goes he  will have to stay at the nursing facility for a minimum of 30 days. [name] expressed understanding and agreement. CSW also talked with patient about using the time at the skilled facility to begin working on other housing options.   Employment status:  Disabled (Comment on whether or not currently receiving Disability) Insurance information:  Medicaid In [place] (Patient receives SSI) PT Recommendations:  Inpatient Rehab Consult ([facility] working with patient as he does not want CIR) Information / Referral to community resources:  [facility] (Patient informed of 1 facility that made bed offer.)  Patient/Family's Response to care:  No concerns expressed by patient regarding care during hospitalization.  Patient/Family's Understanding of and Emotional Response to Diagnosis, Current Treatment, and Prognosis:  Not discussed.  Emotional Assessment Appearance:  Appears stated age Attitude/Demeanor/Rapport:  Other (Appropriate) Affect (typically observed):  Appropriate, Pleasant Orientation:  Oriented to Self, Oriented to Place, Oriented to  Time, Oriented to Situation Alcohol / Substance use:  Tobacco Use, Alcohol Use, Illicit Drugs (Patient has history of marijuana use and smoking. Patient reports that he does  not plan to start smoking again at discharge. No report of drinking by patient.) Psych involvement (Current and /or in the community):  No (Comment)  Discharge Needs  Concerns to be addressed:  Discharge Planning Concerns, Homelessness (Patient reports that he cannot return to his girlfriend's  apartment) Readmission within the last 30 days:  No Current discharge risk:  Physical Impairment, Homeless (SNF recommended for patient, however he will need to work on housing arrangement ) Barriers to Discharge:  Homeless with medical needs   [name], [name] 03/17/2017, 10:33 AM

## 2017-03-17 NOTE — Progress Notes (Signed)
 [facility] Progress Note   Subjective:  Says he is feeling better. Having some phantom pain and itching. Possible DC today if SNF placement found. No C/Os.   Objective Vitals:   03/16/17 1830 03/16/17 1851 03/16/17 2028 03/17/17 0508  BP: (!) 201/93 (!) 176/96 (!) 177/79 (!) 157/85  Pulse: 94 95 (!) 103 96  Resp:  18 18 18   Temp:  97.9 F (36.6 C) 97.7 F (36.5 C) 98.5 F (36.9 C)  TempSrc:  Oral Oral Oral  SpO2:  96% 93% 96%  Weight:  84.1 kg (185 lb 6.5 oz) 83.9 kg (185 lb)   Height:       Physical Exam General: Pleasant, cooperative, NAD Heart: S1,S2, RRR Lungs: CTAB A/P Abdomen: Active BS Extremities: L BKA with drsg CDI, R foot drsg CDI. No LE edema.  Dialysis Access: LUA AVF + bruit    Additional Objective Labs: Basic Metabolic Panel:  Recent Labs Lab 03/14/17 0617 03/15/17 0707 03/16/17 0411  NA 125* 129* 129*  K 4.8 3.9 3.9  CL 86* 93* 91*  CO2 21* 24 24  GLUCOSE 135* 92 133*  BUN 56* 35* 47*  CREATININE 10.12* 6.96* 8.28*  CALCIUM 8.7* 8.8* 8.8*   CBC:  Recent Labs Lab 03/12/17 0356 03/13/17 0611 03/14/17 0617 03/15/17 0707 03/16/17 0411  WBC 7.3 8.5 9.4 8.5 9.1  HGB 9.0* 8.4* 7.6* 9.4* 9.5*  HCT 28.7* 26.1* 23.2* 28.4* 28.5*  MCV 75.9* 73.7* 72.5* 74.5* 74.6*  PLT 311 297 235 252 316   Blood Culture    Component Value Date/Time   SDES BLOOD RIGHT HAND 03/07/2017 2304   SPECREQUEST  03/07/2017 2304    BOTTLES DRAWN AEROBIC AND ANAEROBIC Blood Culture adequate volume   CULT NO GROWTH 5 DAYS 03/07/2017 2304   REPTSTATUS 03/13/2017 FINAL 03/07/2017 2304    CBG:  Recent Labs Lab 03/15/17 2053 03/16/17 0728 03/16/17 1137 03/16/17 2025 03/17/17 0757  GLUCAP 184* 189* 185* 117* 103*   Iron Studies:  Recent Labs  03/16/17 1142  IRON 27*  TIBC 188*  FERRITIN 164   @lablastinr3 @ Studies/Results: No results found. Medications: . methocarbamol (ROBAXIN)  IV 500 mg (03/11/17 1759)   . amLODipine  10 mg  Oral QHS  . calcitRIOL  2.25 mcg Oral Q M,W,F-HD  . calcium acetate  2,001 mg Oral TID WC  . carvedilol  3.125 mg Oral BID WC  . darbepoetin (ARANESP) injection - DIALYSIS  100 mcg Intravenous Q Mon-HD  . docusate sodium  100 mg Oral BID  . feeding supplement (PRO-STAT SUGAR FREE 64)  30 mL Oral BID  . fentaNYL  12.5 mcg Transdermal Q72H  . heparin  5,000 Units Subcutaneous Q8H  . insulin aspart  0-9 Units Subcutaneous TID WC  . insulin glargine  7 Units Subcutaneous QHS  . lanthanum  2,000 mg Oral TID WC  . multivitamin  1 tablet Oral QHS  . nicotine  21 mg Transdermal Daily  . polyethylene glycol  17 g Oral Daily   Dialysis Orders: [facility] MWF  4h 500/800 2/2 bath P2 80.5kg LUA AVF Hep 8000 Venofer 100 mg IV x10 (until 8/10) Calcitriol 2.25 mcg PO q HD BMM: Fosrenol 1000 2 tabs TID/Ca acetate 3 tabs TID  Assessment/Plan: 1. Osteomyelitis L Foot w/gangrene: S/P L BKA and amputation of R 2nd toe. Unable to get bed on CIR. Looking for SNF placement. Will be Dc'd with wound vac.  2. ESRD -HD MWF. Next HD tomorrow. Hold heparin.  3.  Anemia - HGB 9.5 03/16/17. Aranesp 100 mcg IV weekly, last dose 03/14/17. Fe 27, Tsat 14%. Start Fe load with HD.Rec'd 2 units PRBCs 03/14/17.   4. Secondary hyperparathyroidism - Continue binders. Ca 8.8  5. HTN/volume - BP still high. Started on lose dose coreg. Increase dose to 6.25 mg PO BID. HR 90s. HD yesterday on schedule Pre wt 89.5 Net UF 4.5 Post wt 84 kg. Continue attempting to lower EDW.  6. Nutrition - Change to renal carb mod diet. Last Albumin 3.2. Renal vit/pro stat.  7. DM: Per primary 8. Combined systolic/diastolic HF. Continue lowering volume.   Disposition: To SNF when placement available, possibly today. Will enter HD orders in case he is not DC'd.    H.  NP-C 03/17/2017, 8:24 AM  [employer] [phone]

## 2017-03-17 NOTE — Clinical Social Work Placement (Addendum)
   CLINICAL SOCIAL WORK PLACEMENT  NOTE  Date:  03/17/2017  Patient Details  Name: [name] Pimenta Sr. MRN: [id number] Date of Birth: [birth year]  Clinical Social Work is seeking post-discharge placement for this patient at the [place] level of care (*CSW will initial, date and re-position this form in  chart as items are completed):  No (Patient only had one bed offer - Information systems manager)   Patient/family provided with [place] Work Department's list of facilities offering this level of care within the geographic area requested by the patient (or if unable, by the patient's family).  Yes   Patient/family informed of their freedom to choose among providers that offer the needed level of care, that participate in Medicare, Medicaid or managed care program needed by the patient, have an available bed and are willing to accept the patient.  No   Patient/family informed of [facility]'s ownership interest in [facility] and [facility], as well as of the fact that they are under no obligation to receive care at these facilities.  PASRR submitted to EDS on 03/14/17     PASRR number received on 03/14/17     Existing PASRR number confirmed on       FL2 transmitted to all facilities in geographic area requested by pt/family on 03/15/17     FL2 transmitted to all facilities within larger geographic area on       Patient informed that his/her managed care company has contracts with or will negotiate with certain facilities, including the following:        Yes   Patient/family informed of bed offers received.   Patient [facility] bed at   [facility]    Physician recommends and patient chooses bed at      Patient to be transferred to  [facility] on  03/17/17.  Patient to be transferred to facility by  ambulance     Patient family notified on  03/17/17 of transfer.  Name of family member notified:   Patient's sister, [name] called [phone])  and message left.  PHYSICIAN       Additional Comment:    _______________________________________________ Sable Feil, LCSW 03/17/2017, 11:00 AM

## 2017-03-17 NOTE — Discharge Summary (Signed)
Physician Discharge Summary   Patient ID: [name] Saenz Sr. MRN: [id number] DOB/AGE: May 30, 1976 41 y.o.  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 Diabetic foot infection ([place]) . Essential hypertension . Anemia of renal disease . Dehiscence of amputation stump (HCC) ESRD on hemodialysis Monday Wednesday Friday   Chronic combined systolic and diastolic CHF  Consults:  Nephrology  Recommendations for Outpatient Follow-up:  1. Please repeat CBC/BMET at next visit   DIET: Carb modified diet    Allergies:   Allergies  Allergen Reactions  . Coconut Oil Anaphylaxis    Can use topically, allergic to coconut foods      DISCHARGE MEDICATIONS: Current Discharge Medication List    START taking these medications   Details  Amino Acids-Protein Hydrolys (FEEDING SUPPLEMENT, PRO-STAT SUGAR FREE 64,) LIQD Take 30 mLs by mouth 2 (two) times daily. Qty: 900 mL, Refills: 0    calcitRIOL (ROCALTROL) 0.25 MCG capsule Take 9 capsules (2.25 mcg total) by mouth every Monday, Wednesday, and Friday with hemodialysis.    carvedilol (COREG) 6.25 MG tablet Take 1 tablet (6.25 mg total) by mouth 2 (two) times daily with a meal.    docusate sodium (COLACE) 100 MG capsule Take 1 capsule (100 mg total) by mouth 2 (two) times daily. Qty: 10 capsule, Refills: 0    fentaNYL (DURAGESIC - DOSED MCG/HR) 12 MCG/HR Place 1 patch (12.5 mcg total) onto the skin every 3 (three) days. Qty: 5 patch, Refills: 0    insulin aspart (NOVOLOG) 100 UNIT/ML injection Sliding scale CBG 70 - 120: 0 units CBG 121 - 150: 1 unit,  CBG 151 - 200: 2 units,  CBG 201 - 250: 3 units,  CBG 251 - 300: 5 units,  CBG 301 - 350: 7 units,  CBG 351 - 400: 9 units   CBG > 400: 9 units and notify your MD Qty: 10 mL, Refills: 11    methocarbamol (ROBAXIN) 500 MG tablet Take  1 tablet (500 mg total) by mouth every 6 (six) hours as needed for muscle spasms.    multivitamin (RENA-VIT) TABS tablet Take 1 tablet by mouth at bedtime. Refills: 0    nicotine (NICODERM CQ - DOSED IN MG/24 HOURS) 21 mg/24hr patch Place 1 patch (21 mg total) onto the skin daily. Qty: 28 patch, Refills: 0    oxyCODONE (OXY IR/ROXICODONE) 5 MG immediate release tablet Take 1-2 tablets (5-10 mg total) by mouth every 4 (four) hours as needed for breakthrough pain. Qty: 20 tablet, Refills: 0    polyethylene glycol (MIRALAX / GLYCOLAX) packet Take 17 g by mouth daily. Qty: 14 each, Refills: 0      CONTINUE these medications which have CHANGED   Details  calcium acetate (PHOSLO) 667 MG capsule Take 3 capsules (2,001 mg total) by mouth 3 (three) times daily with meals.    insulin glargine (LANTUS) 100 UNIT/ML injection Inject 0.07 mLs (7 Units total) into the skin at bedtime. Qty: 10 mL, Refills: 12      CONTINUE these medications which have NOT CHANGED   Details  acetaminophen (TYLENOL) 500 MG tablet Take 1,000 mg by mouth every 6 (six) hours as needed for mild pain.    amLODipine (NORVASC) 10 MG tablet Take 1 tablet (10 mg total) by mouth at bedtime. Qty: 30 tablet, Refills: 0    clopidogrel (  PLAVIX) 75 MG tablet Take 1 tablet (75 mg total) by mouth daily. Qty: 30 tablet, Refills: 3    glucagon (GLUCAGON EMERGENCY) 1 MG injection Inject 1 mg into the vein once as needed (low blood sugar and inability to eat or drink sugar). Qty: 1 each, Refills: 0    lanthanum (FOSRENOL) 1000 MG chewable tablet Chew 2 tablets (2,000 mg total) by mouth 3 (three) times daily with meals. Qty: 180 tablet, Refills: 0      STOP taking these medications     doxycycline (VIBRAMYCIN) 100 MG capsule      oxyCODONE-acetaminophen (PERCOCET/ROXICET) 5-325 MG tablet          Brief H and P: For complete details please refer to admission H and P, but in brief [name] Francois a 41  y.o.malewith medical history significant for ESRD on HD, last performedon 03/07/2017, hypertension, diabetes, combined systolic and diastolic CHF, PVD, CAD, tobacco abuse, and status post amputation of all toes on L foot and , R and great toe on 3/23 with stump revision on 5/29by [name] presented with bilateral foot pain and drainage with foul smelling odor at the amputation site. Patient had been on doxycycline for one month. Patient was admitted with osteomyelitis, underwent left BKA and right second toe amputation.  Hospital Course:   Osteomyelitis of the foot, dehiscence of the left metatarsal amputation with gangrenous changes in the setting of PVD, diabetes - Status post transmetatarsal amputation of the left on 3/23, stump revision on 5/29 after dehiscence of transmetatarsal amputation of left foot, right second 2nd toe  - Orthopedics was consulted, patient underwent trans tibial amputation of the left, right foot amputation second toe at the MTP joint, application of the Prevena wound vac left on 7/27 - Blood cultures showed no growth, antibiotics have been discontinued 48 hours postop - Inpatient rehabilitation was consulted however recommended skilled nursing facility placement.  - Patient needed aggressive pain control, PCA pump, however now pain is much controlled with the fentanyl patch and oxycodone. Please adjust pain medications with his improvement and then the pain level and wound healing.  ESRD - Nephrology was consulted and patient underwent dialysis Monday Wednesday Friday according to his schedule.  - Next hemodialysis on Friday 8/3   Nonsustained V. Tach - Currently stable, keep potassium~4, magnesium~2  Chronic combined systolic and diastolic CHF - 2-D echo 10/3293 showed EF of 18-84%, grade 2 diastolic dysfunction - Currently euvolemic, volume control with hemodialysis  Essential hypertension - Continue amlodipine, low-dose Coreg  Diabetes mellitus type  II - CBGs controlled, continue Lantus, sliding scale insulin, - Hemoglobin A1c 6.1  Anemia of chronic disease  - Hemoglobin dropped to 7.6 on 7/30 likely due to surgery postop, was transfused 2 units, currently H&H stable  Tobacco abuse - Continue nicotine patch  Chronic hyponatremia Currently stable, continue to monitor    Day of Discharge BP (!) 168/93 (BP Location: Right Arm)   Pulse 94   Temp 98.6 F (37 C) (Oral)   Resp 18   Ht 6\' 2"  (1.88 m)   Wt 83.9 kg (185 lb)   SpO2 97%   BMI 23.75 kg/m   Physical Exam: General: Alert and awake oriented x3 not in any acute distress. HEENT: anicteric sclera, pupils reactive to light and accommodation CVS: S1-S2 clear no murmur rubs or gallops Chest: clear to auscultation bilaterally, no wheezing rales or rhonchi Abdomen: soft nontender, nondistended, normal bowel sounds Extremities: Left BKA with wound VAC, right foot  dressing intact    The results of significant diagnostics from this hospitalization (including imaging, microbiology, ancillary and laboratory) are listed below for reference.    LAB RESULTS: Basic Metabolic Panel:  Recent Labs Lab 03/15/17 0707 03/16/17 0411  NA 129* 129*  K 3.9 3.9  CL 93* 91*  CO2 24 24  GLUCOSE 92 133*  BUN 35* 47*  CREATININE 6.96* 8.28*  CALCIUM 8.8* 8.8*   Liver Function Tests: No results for input(s): AST, ALT, ALKPHOS, BILITOT, PROT, ALBUMIN in the last 168 hours. No results for input(s): LIPASE, AMYLASE in the last 168 hours. No results for input(s): AMMONIA in the last 168 hours. CBC:  Recent Labs Lab 03/15/17 0707 03/16/17 0411  WBC 8.5 9.1  HGB 9.4* 9.5*  HCT 28.4* 28.5*  MCV 74.5* 74.6*  PLT 252 316   Cardiac Enzymes: No results for input(s): CKTOTAL, CKMB, CKMBINDEX, TROPONINI in the last 168 hours. BNP: Invalid input(s): POCBNP CBG:  Recent Labs Lab 03/17/17 0757 03/17/17 1228  GLUCAP 103* 136*    Significant Diagnostic Studies:  Dg Foot  Complete Left  Result Date: 03/07/2017 CLINICAL DATA:  Diabetic foot ulcer EXAM: LEFT FOOT - COMPLETE 3+ VIEW COMPARISON:  09/11/2016 FINDINGS: There is been interval transmetatarsal amputation performed. Vascular calcifications are seen. A large soft tissue wound is noted along the stump. Some lucency is noted within the residual first and second metatarsals suggestive of osteomyelitis. No other focal abnormality is seen. IMPRESSION: Large soft tissue wound with changes of erosion in the first and second metatarsal remnants suggestive of osteomyelitis. Electronically Signed   By: Inez Catalina M.D.   On: 03/07/2017 22:46   Dg Foot Complete Right  Result Date: 03/07/2017 CLINICAL DATA:  Diabetic foot ulcer with second toe pain, initial encounter EXAM: RIGHT FOOT COMPLETE - 3+ VIEW COMPARISON:  02/11/2017 FINDINGS: First toe amputation is again identified. Diffuse vascular calcifications are seen. The diabetic foot wound is not well appreciated on this exam. No bony erosion to suggest osteomyelitis is noted. IMPRESSION: Prior surgical change.  No definitive osteomyelitis is noted. Electronically Signed   By: Inez Catalina M.D.   On: 03/07/2017 22:47    2D ECHO:   Disposition and Follow-up: Discharge Instructions    Diet Carb Modified    Complete by:  As directed    Increase activity slowly    Complete by:  As directed        DISPOSITION: Skilled nursing facility   [place]    Suzan Slick, NP In 1 week.   Specialty:  Orthopedic Surgery Why:  Please contact office to schedule a follow up appointment. Thank you.  Contact information: [place] [address] [phone]            Time spent on Discharge: 58mins   Signed:   Estill Cotta M.D. [facility] 03/17/2017, 12:30 PM Pager: [phone]

## 2017-03-17 NOTE — Care Management Note (Signed)
Case Management Note [name] Gibbons RN, BSN Unit 4E-Case Manager-- 6E coverage [phone]  Patient Details  Name: [name] [facility] Sr. MRN: [id number] Date of Birth: February 14, 1976  Subjective/Objective:    Pt admitted with osteomyelitis of foot with dehiscence of left metatarsal amputation- s/p left BKA on  03/11/17 with Prevena Wound VAC application               Action/Plan: PTA pt lived at home with spouse- will have family available PRN at discharge, per PT eval recommendation for CIR, CIR consult has been placed- CM will follow up once CIR has seen for d/c plans.   Expected Discharge Date:  03/17/17               Expected Discharge Plan:  [place]  In-House Referral:  Clinical Social Work  Discharge planning Services  CM Consult  Post Acute Care Choice:  NA Choice offered to:  NA  DME Arranged:    DME Agency:     HH Arranged:    [place] Agency:     Status of Service:  Completed, signed off  If discussed at [employer] of Stay Meetings, dates discussed:  8/2  Discharge Disposition: skilled facility   Additional Comments: 03/17/17- 1200- [name] Kolander RN, CM- pt for d/c today CIR does not have any available bed- CSW working on SNF placement  [name], [employer], RN 03/17/2017, 12:46 PM

## 2017-03-29 ENCOUNTER — Ambulatory Visit (INDEPENDENT_AMBULATORY_CARE_PROVIDER_SITE_OTHER): Payer: Medicaid Other | Admitting: Orthopedic Surgery

## 2017-03-29 ENCOUNTER — Encounter (INDEPENDENT_AMBULATORY_CARE_PROVIDER_SITE_OTHER): Payer: Self-pay | Admitting: Orthopedic Surgery

## 2017-03-29 VITALS — Ht 74.0 in | Wt 185.0 lb

## 2017-03-29 DIAGNOSIS — Z89512 Acquired absence of left leg below knee: Secondary | ICD-10-CM

## 2017-03-29 DIAGNOSIS — Z89421 Acquired absence of other right toe(s): Secondary | ICD-10-CM

## 2017-03-29 NOTE — Progress Notes (Signed)
Office Visit Note   Patient: [name] Kirker Sr.           Date of Birth: 23-May-1976           MRN: [id number] Visit Date: 03/29/2017              Requested by: No referring provider defined for this encounter. PCP: Patient, [name] Per  Chief Complaint  Patient presents with  . Left Leg - Routine Post Op    03/11/17 left BKA      HPI: Patient is little over 2 weeks status post right second toe amputation and left transtibial amputation. Patient complains of pain in both lower extremities.  Assessment & Plan: Visit Diagnoses:  1. Status post below knee amputation, left (HCC)   2. Status post amputation of toe of right foot ([place])     Plan: We will harvest the sutures in the right foot follow-up in 2 weeks to harvest a staples in the left transtibial amputation. Patient was given strict instructions to work on extension for the left knee he is to wear the stump shrinker at all times to minimize swelling.  Follow-Up Instructions: Return in about 2 weeks (around 04/12/2017).   Ortho Exam  Patient is alert, oriented, no adenopathy, well-dressed, normal affect, normal respiratory effort. Examination the incisions are healing quite nicely he does have venous stasis swelling of both legs and is developing a flexion contracture in the left knee. Discussed the critical nature of working on extension to BLD use a prosthesis. Patient will work with physical therapy for this.  Imaging: No results found. No images are attached to the encounter.  Labs: Lab Results  Component Value Date   HGBA1C 6.1 (H) 03/11/2017   HGBA1C 6.1 (H) 08/15/2015   HGBA1C 5.9 (H) 09/12/2014   ESRSEDRATE 94 (H) 08/15/2015   CRP 5.2 (H) 08/15/2015   REPTSTATUS 03/13/2017 FINAL 03/07/2017   GRAMSTAIN  08/08/2013    FEW WBC PRESENT, PREDOMINANTLY PMN RARE SQUAMOUS EPITHELIAL CELLS PRESENT MODERATE GRAM POSITIVE COCCI IN PAIRS IN CLUSTERS Performed at [facility]   CULT NO GROWTH 5 DAYS  03/07/2017   LABORGA METHICILLIN RESISTANT STAPHYLOCOCCUS AUREUS 08/08/2013    Orders:  No orders of the defined types were placed in this encounter.  No orders of the defined types were placed in this encounter.    Procedures: No procedures performed  Clinical Data: No additional findings.  ROS:  All other systems negative, except as noted in the HPI. Review of Systems  Objective: Vital Signs: Ht 6\' 2"  (1.88 m)   Wt 185 lb (83.9 kg)   BMI 23.75 kg/m   Specialty Comments:  No specialty comments available.  PMFS History: Patient Active Problem List   Diagnosis Date Noted  . S/P unilateral BKA (below knee amputation), left ([place])   . Post-operative pain   . Acute blood loss anemia   . Chronic combined systolic and diastolic CHF (congestive heart failure) ([place])   . PVD (peripheral vascular disease) ([place])   . Coronary artery disease involving native coronary artery of native heart without angina pectoris   . Tobacco abuse   . Diabetic foot infection ([place]) 03/08/2017  . Osteomyelitis ([place]) 03/08/2017  . Wound dehiscence   . Diabetes mellitus with complication ([place])   . Dehiscence of amputation stump ([place]) 01/04/2017  . S/P transmetatarsal amputation of foot, left ([place]) 11/16/2016  . Gangrene of right foot ([place]) 08/02/2016  . Achilles tendon contracture, left 08/02/2016  .  Anemia of renal disease 08/16/2015  . Wound infection 08/15/2015  . Diabetic ulcer of right great toe ([place]) 08/15/2015  . Pain in the chest   . Elevated troponin   . Essential hypertension   . ESRD on dialysis ([place]) 08/07/2013  . Diabetes ([place]) 08/07/2013  . Chest pain 05/16/2012  . Murmur 05/16/2012  . Renal disorder    Past Medical History:  Diagnosis Date  . Anemia   . Atherosclerosis of lower extremity ([place])   . CAD (coronary artery disease)   . Chronic combined systolic and diastolic heart failure ([place])   . Complication of anesthesia   . ESRD (end stage renal disease) on dialysis  [facility])    "MWF [facility]" (03/08/2017)  . GERD (gastroesophageal reflux disease)   . Heart murmur   . History of blood transfusion    "related to OR"  . Hypertension   . PONV (postoperative nausea and vomiting)   . Type II diabetes mellitus (HCC)     Family History  Problem Relation Age of Onset  . Heart failure Mother   . Hypertension Mother     Past Surgical History:  Procedure Laterality Date  . ABDOMINAL AORTOGRAM N/A 11/02/2016   Procedure: Abdominal Aortogram;  Surgeon: Waynetta Sandy, MD;  Location: [place] CV LAB;  Service: Cardiovascular;  Laterality: N/A;  . AMPUTATION Left 09/27/2013   Procedure: LEFT GREAT TOE AMPUTATION;  Surgeon: [name], MD;  Location: [place];  Service: Orthopedics;  Laterality: Left;  . AMPUTATION Right 08/15/2015   Procedure: Right Great Toe Amputation;  Surgeon: [name], MD;  Location: [place];  Service: Orthopedics;  Laterality: Right;  . AMPUTATION Left 11/05/2016   Procedure: TRANSMETATARSAL AMPUTATION LEFT FOOT;  Surgeon: [name], MD;  Location: [place];  Service: Orthopedics;  Laterality: Left;  . AMPUTATION Left 03/11/2017   Procedure: LEFT BELOW KNEE AMPUTATION;  Surgeon: [name], MD;  Location: [place];  Service: Orthopedics;  Laterality: Left;  . AMPUTATION Right 03/11/2017   Procedure: RIGHT 2ND TOE AMPUTATION;  Surgeon: [name], MD;  Location: [place];  Service: Orthopedics;  Laterality: Right;  . AV FISTULA PLACEMENT  left arm  . LEFT HEART CATHETERIZATION WITH CORONARY ANGIOGRAM N/A 09/13/2014   Procedure: LEFT HEART CATHETERIZATION WITH CORONARY ANGIOGRAM;  Surgeon: Sinclair Grooms, MD;  Location: [facility] CATH LAB;  Service: Cardiovascular;  Laterality: N/A;  . LOWER EXTREMITY ANGIOGRAPHY Bilateral 11/02/2016   Procedure: Lower Extremity Angiography;  Surgeon: Waynetta Sandy, MD;  Location: [place] CV LAB;  Service: Cardiovascular;  Laterality: Bilateral;  . PERIPHERAL VASCULAR ATHERECTOMY  Left 11/02/2016   Procedure: Peripheral Vascular Atherectomy;  Surgeon: Waynetta Sandy, MD;  Location: [place] CV LAB;  Service: Cardiovascular;  Laterality: Left;  PERONEAL  . STUMP REVISION Left 01/11/2017   Procedure: Revision Left Transmetatarsal Amputation;  Surgeon: [name], MD;  Location: [place];  Service: Orthopedics;  Laterality: Left;   Social History   Occupational History  . Not on file.   Social History Main Topics  . Smoking status: Current Every Day Smoker    Packs/day: 0.50    Years: 26.00    Types: Cigarettes  . Smokeless tobacco: Never Used  . Alcohol use Yes     Comment: 03/08/2017 "haven't took a drink in over 5 years"  . Drug use: Yes    Types: Marijuana     Comment: 03/08/2017 "basically q day"   . Sexual activity:  Yes

## 2017-04-05 ENCOUNTER — Ambulatory Visit (INDEPENDENT_AMBULATORY_CARE_PROVIDER_SITE_OTHER): Payer: Medicaid Other | Admitting: Orthopedic Surgery

## 2017-04-05 ENCOUNTER — Encounter (INDEPENDENT_AMBULATORY_CARE_PROVIDER_SITE_OTHER): Payer: Self-pay | Admitting: Orthopedic Surgery

## 2017-04-05 VITALS — Ht 74.0 in | Wt 185.0 lb

## 2017-04-05 DIAGNOSIS — Z89512 Acquired absence of left leg below knee: Secondary | ICD-10-CM

## 2017-04-05 DIAGNOSIS — Z89421 Acquired absence of other right toe(s): Secondary | ICD-10-CM

## 2017-04-05 NOTE — Progress Notes (Signed)
Office Visit Note   Patient: [name] Pooley Sr.           Date of Birth: 10-02-1975           MRN: [id number] Visit Date: 04/05/2017              Requested by: No referring provider defined for this encounter. PCP: Patient, [name] Per  Chief Complaint  Patient presents with  . Post-op Follow-up    03/11/17 LT BKA and Rt 2nd toe amp      HPI: Patient presents for follow-up status post left transtibial amputation and right second toe amputation.   Assessment & Plan: Visit Diagnoses:  1. Status post below knee amputation, left (HCC)   2. Status post amputation of toe of right foot ([place])     Plan: We will harvest the sutures he'll continue with a stump shrinker on the left continue with regular shoewear on the right he will follow up with [facility] for prosthetic fitting.  Follow-Up Instructions: Return in about 3 weeks (around 04/26/2017).   Ortho Exam  Patient is alert, oriented, no adenopathy, well-dressed, normal affect, normal respiratory effort. Examination incision is healing quite nicely there is minimal swelling he does have some dry cracked skin recommended shave bladder. Recommended continue with a stump shrinker around-the-clock patient has no cellulitis no drainage no odor no signs of infection.  Imaging: No results found. No images are attached to the encounter.  Labs: Lab Results  Component Value Date   HGBA1C 6.1 (H) 03/11/2017   HGBA1C 6.1 (H) 08/15/2015   HGBA1C 5.9 (H) 09/12/2014   ESRSEDRATE 94 (H) 08/15/2015   CRP 5.2 (H) 08/15/2015   REPTSTATUS 03/13/2017 FINAL 03/07/2017   GRAMSTAIN  08/08/2013    FEW WBC PRESENT, PREDOMINANTLY PMN RARE SQUAMOUS EPITHELIAL CELLS PRESENT MODERATE GRAM POSITIVE COCCI IN PAIRS IN CLUSTERS Performed at [facility]   CULT NO GROWTH 5 DAYS 03/07/2017   LABORGA METHICILLIN RESISTANT STAPHYLOCOCCUS AUREUS 08/08/2013    Orders:  No orders of the defined types were placed in this encounter.  No  orders of the defined types were placed in this encounter.    Procedures: No procedures performed  Clinical Data: No additional findings.  ROS:  All other systems negative, except as noted in the HPI. Review of Systems  Objective: Vital Signs: Ht 6\' 2"  (1.88 m)   Wt 185 lb (83.9 kg)   BMI 23.75 kg/m   Specialty Comments:  No specialty comments available.  PMFS History: Patient Active Problem List   Diagnosis Date Noted  . S/P unilateral BKA (below knee amputation), left ([place])   . Post-operative pain   . Acute blood loss anemia   . Chronic combined systolic and diastolic CHF (congestive heart failure) ([place])   . PVD (peripheral vascular disease) ([place])   . Coronary artery disease involving native coronary artery of native heart without angina pectoris   . Tobacco abuse   . Diabetic foot infection ([place]) 03/08/2017  . Osteomyelitis ([place]) 03/08/2017  . Wound dehiscence   . Diabetes mellitus with complication ([place])   . Dehiscence of amputation stump ([place]) 01/04/2017  . S/P transmetatarsal amputation of foot, left ([place]) 11/16/2016  . Gangrene of right foot ([place]) 08/02/2016  . Achilles tendon contracture, left 08/02/2016  . Anemia of renal disease 08/16/2015  . Wound infection 08/15/2015  . Diabetic ulcer of right great toe ([place]) 08/15/2015  . Pain in the chest   . Elevated troponin   .  Essential hypertension   . ESRD on dialysis ([place]) 08/07/2013  . Diabetes ([place]) 08/07/2013  . Chest pain 05/16/2012  . Murmur 05/16/2012  . Renal disorder    Past Medical History:  Diagnosis Date  . Anemia   . Atherosclerosis of lower extremity ([place])   . CAD (coronary artery disease)   . Chronic combined systolic and diastolic heart failure ([place])   . Complication of anesthesia   . ESRD (end stage renal disease) on dialysis [facility])    "MWF [facility]" (03/08/2017)  . GERD (gastroesophageal reflux disease)   . Heart murmur   . History of blood transfusion    "related to OR"  .  Hypertension   . PONV (postoperative nausea and vomiting)   . Type II diabetes mellitus (HCC)     Family History  Problem Relation Age of Onset  . Heart failure Mother   . Hypertension Mother     Past Surgical History:  Procedure Laterality Date  . ABDOMINAL AORTOGRAM N/A 11/02/2016   Procedure: Abdominal Aortogram;  Surgeon: Waynetta Sandy, MD;  Location: [place] CV LAB;  Service: Cardiovascular;  Laterality: N/A;  . AMPUTATION Left 09/27/2013   Procedure: LEFT GREAT TOE AMPUTATION;  Surgeon: [name], MD;  Location: [place];  Service: Orthopedics;  Laterality: Left;  . AMPUTATION Right 08/15/2015   Procedure: Right Great Toe Amputation;  Surgeon: [name], MD;  Location: [place];  Service: Orthopedics;  Laterality: Right;  . AMPUTATION Left 11/05/2016   Procedure: TRANSMETATARSAL AMPUTATION LEFT FOOT;  Surgeon: [name], MD;  Location: [place];  Service: Orthopedics;  Laterality: Left;  . AMPUTATION Left 03/11/2017   Procedure: LEFT BELOW KNEE AMPUTATION;  Surgeon: [name], MD;  Location: [place];  Service: Orthopedics;  Laterality: Left;  . AMPUTATION Right 03/11/2017   Procedure: RIGHT 2ND TOE AMPUTATION;  Surgeon: [name], MD;  Location: [place];  Service: Orthopedics;  Laterality: Right;  . AV FISTULA PLACEMENT  left arm  . LEFT HEART CATHETERIZATION WITH CORONARY ANGIOGRAM N/A 09/13/2014   Procedure: LEFT HEART CATHETERIZATION WITH CORONARY ANGIOGRAM;  Surgeon: Sinclair Grooms, MD;  Location: [facility] CATH LAB;  Service: Cardiovascular;  Laterality: N/A;  . LOWER EXTREMITY ANGIOGRAPHY Bilateral 11/02/2016   Procedure: Lower Extremity Angiography;  Surgeon: Waynetta Sandy, MD;  Location: [place] CV LAB;  Service: Cardiovascular;  Laterality: Bilateral;  . PERIPHERAL VASCULAR ATHERECTOMY Left 11/02/2016   Procedure: Peripheral Vascular Atherectomy;  Surgeon: Waynetta Sandy, MD;  Location: [place] CV LAB;  Service: Cardiovascular;   Laterality: Left;  PERONEAL  . STUMP REVISION Left 01/11/2017   Procedure: Revision Left Transmetatarsal Amputation;  Surgeon: [name], MD;  Location: [place];  Service: Orthopedics;  Laterality: Left;   Social History   Occupational History  . Not on file.   Social History Main Topics  . Smoking status: Current Every Day Smoker    Packs/day: 0.50    Years: 26.00    Types: Cigarettes  . Smokeless tobacco: Never Used  . Alcohol use Yes     Comment: 03/08/2017 "haven't took a drink in over 5 years"  . Drug use: Yes    Types: Marijuana     Comment: 03/08/2017 "basically q day"   . Sexual activity: Yes

## 2017-04-12 ENCOUNTER — Ambulatory Visit (INDEPENDENT_AMBULATORY_CARE_PROVIDER_SITE_OTHER): Payer: Medicaid Other | Admitting: Orthopedic Surgery

## 2017-04-26 ENCOUNTER — Ambulatory Visit (INDEPENDENT_AMBULATORY_CARE_PROVIDER_SITE_OTHER): Payer: Medicaid Other | Admitting: Orthopedic Surgery

## 2017-04-26 DIAGNOSIS — Z89512 Acquired absence of left leg below knee: Secondary | ICD-10-CM

## 2017-04-26 NOTE — Progress Notes (Signed)
Office Visit Note   Patient: [name] Carvalho Sr.           Date of Birth: 08/16/1976           MRN: [id number] Visit Date: 04/26/2017              Requested by: No referring provider defined for this encounter. PCP: Patient, [name] Per  Chief Complaint  Patient presents with  . Left Leg - Routine Post Op      HPI: Patient is 6 weeks status post left transtibial amputation. He is using a 3 XL large stump shrinker. Patient is still smoking has on nasal cannula FiO2.  Assessment & Plan: Visit Diagnoses:  1. Status post below knee amputation, left ([place])     Plan: Continue with the stump shrinker follow-up [employer] anticipate he will need a 2 extra large stump shrinker.  Follow-Up Instructions: Return in about 4 weeks (around 05/24/2017).   Ortho Exam  Patient is alert, oriented, no adenopathy, well-dressed, normal affect, normal respiratory effort. Examination the residual limb is consolidating nicely. There is excellent superficial epithelialization around the wound edges there is no ischemic changes no drainage no odor no depth to the residual incision.  Imaging: No results found. No images are attached to the encounter.  Labs: Lab Results  Component Value Date   HGBA1C 6.1 (H) 03/11/2017   HGBA1C 6.1 (H) 08/15/2015   HGBA1C 5.9 (H) 09/12/2014   ESRSEDRATE 94 (H) 08/15/2015   CRP 5.2 (H) 08/15/2015   REPTSTATUS 03/13/2017 FINAL 03/07/2017   GRAMSTAIN  08/08/2013    FEW WBC PRESENT, PREDOMINANTLY PMN RARE SQUAMOUS EPITHELIAL CELLS PRESENT MODERATE GRAM POSITIVE COCCI IN PAIRS IN CLUSTERS Performed at [facility]   CULT NO GROWTH 5 DAYS 03/07/2017   LABORGA METHICILLIN RESISTANT STAPHYLOCOCCUS AUREUS 08/08/2013    Orders:  No orders of the defined types were placed in this encounter.  No orders of the defined types were placed in this encounter.    Procedures: No procedures performed  Clinical Data: No additional findings.  ROS:  All  other systems negative, except as noted in the HPI. Review of Systems  Objective: Vital Signs: There were no vitals taken for this visit.  Specialty Comments:  No specialty comments available.  PMFS History: Patient Active Problem List   Diagnosis Date Noted  . S/P unilateral BKA (below knee amputation), left ([place])   . Post-operative pain   . Acute blood loss anemia   . Chronic combined systolic and diastolic CHF (congestive heart failure) ([place])   . PVD (peripheral vascular disease) ([place])   . Coronary artery disease involving native coronary artery of native heart without angina pectoris   . Tobacco abuse   . Diabetic foot infection ([place]) 03/08/2017  . Osteomyelitis ([place]) 03/08/2017  . Wound dehiscence   . Diabetes mellitus with complication ([place])   . Dehiscence of amputation stump ([place]) 01/04/2017  . S/P transmetatarsal amputation of foot, left ([place]) 11/16/2016  . Gangrene of right foot ([place]) 08/02/2016  . Achilles tendon contracture, left 08/02/2016  . Anemia of renal disease 08/16/2015  . Wound infection 08/15/2015  . Diabetic ulcer of right great toe ([place]) 08/15/2015  . Pain in the chest   . Elevated troponin   . Essential hypertension   . ESRD on dialysis ([place]) 08/07/2013  . Diabetes ([place]) 08/07/2013  . Chest pain 05/16/2012  . Murmur 05/16/2012  . Renal disorder    Past Medical History:  Diagnosis Date  .  Anemia   . Atherosclerosis of lower extremity ([place])   . CAD (coronary artery disease)   . Chronic combined systolic and diastolic heart failure ([place])   . Complication of anesthesia   . ESRD (end stage renal disease) on dialysis [facility])    "MWF [facility]" (03/08/2017)  . GERD (gastroesophageal reflux disease)   . Heart murmur   . History of blood transfusion    "related to OR"  . Hypertension   . PONV (postoperative nausea and vomiting)   . Type II diabetes mellitus (HCC)     Family History  Problem Relation Age of Onset  . Heart failure Mother   .  Hypertension Mother     Past Surgical History:  Procedure Laterality Date  . ABDOMINAL AORTOGRAM N/A 11/02/2016   Procedure: Abdominal Aortogram;  Surgeon: Waynetta Sandy, MD;  Location: [place] [facility];  Service: Cardiovascular;  Laterality: N/A;  . AMPUTATION Left 09/27/2013   Procedure: LEFT GREAT TOE AMPUTATION;  Surgeon: [name], MD;  Location: [place];  Service: Orthopedics;  Laterality: Left;  . AMPUTATION Right 08/15/2015   Procedure: Right Great Toe Amputation;  Surgeon: [name], MD;  Location: [place];  Service: Orthopedics;  Laterality: Right;  . AMPUTATION Left 11/05/2016   Procedure: TRANSMETATARSAL AMPUTATION LEFT FOOT;  Surgeon: [name], MD;  Location: [place];  Service: Orthopedics;  Laterality: Left;  . AMPUTATION Left 03/11/2017   Procedure: LEFT BELOW KNEE AMPUTATION;  Surgeon: [name], MD;  Location: [place];  Service: Orthopedics;  Laterality: Left;  . AMPUTATION Right 03/11/2017   Procedure: RIGHT 2ND TOE AMPUTATION;  Surgeon: [name], MD;  Location: [place];  Service: Orthopedics;  Laterality: Right;  . AV FISTULA PLACEMENT  left arm  . LEFT HEART CATHETERIZATION WITH CORONARY ANGIOGRAM N/A 09/13/2014   Procedure: LEFT HEART CATHETERIZATION WITH CORONARY ANGIOGRAM;  Surgeon: Sinclair Grooms, MD;  Location: [facility] CATH LAB;  Service: Cardiovascular;  Laterality: N/A;  . LOWER EXTREMITY ANGIOGRAPHY Bilateral 11/02/2016   Procedure: Lower Extremity Angiography;  Surgeon: Waynetta Sandy, MD;  Location: [place] CV LAB;  Service: Cardiovascular;  Laterality: Bilateral;  . PERIPHERAL VASCULAR ATHERECTOMY Left 11/02/2016   Procedure: Peripheral Vascular Atherectomy;  Surgeon: Waynetta Sandy, MD;  Location: [place] CV LAB;  Service: Cardiovascular;  Laterality: Left;  PERONEAL  . STUMP REVISION Left 01/11/2017   Procedure: Revision Left Transmetatarsal Amputation;  Surgeon: [name], MD;  Location: [place];  Service:  Orthopedics;  Laterality: Left;   Social History   Occupational History  . Not on file.   Social History Main Topics  . Smoking status: Current Every Day Smoker    Packs/day: 0.50    Years: 26.00    Types: Cigarettes  . Smokeless tobacco: Never Used  . Alcohol use Yes     Comment: 03/08/2017 "haven't took a drink in over 5 years"  . Drug use: Yes    Types: Marijuana     Comment: 03/08/2017 "basically q day"   . Sexual activity: Yes

## 2017-04-29 ENCOUNTER — Encounter (HOSPITAL_COMMUNITY): Payer: Self-pay

## 2017-04-29 ENCOUNTER — Emergency Department (HOSPITAL_COMMUNITY)
Admission: EM | Admit: 2017-04-29 | Discharge: 2017-04-29 | Disposition: A | Discharge status: Home / Self Care | Payer: Medicaid Other | Attending: Emergency Medicine | Admitting: Emergency Medicine

## 2017-04-29 DIAGNOSIS — E1122 Type 2 diabetes mellitus with diabetic chronic kidney disease: Secondary | ICD-10-CM | POA: Diagnosis not present

## 2017-04-29 DIAGNOSIS — F17210 Nicotine dependence, cigarettes, uncomplicated: Secondary | ICD-10-CM | POA: Insufficient documentation

## 2017-04-29 DIAGNOSIS — Y828 Other medical devices associated with adverse incidents: Secondary | ICD-10-CM | POA: Diagnosis not present

## 2017-04-29 DIAGNOSIS — Z79899 Other long term (current) drug therapy: Secondary | ICD-10-CM | POA: Diagnosis not present

## 2017-04-29 DIAGNOSIS — Z7902 Long term (current) use of antithrombotics/antiplatelets: Secondary | ICD-10-CM | POA: Diagnosis not present

## 2017-04-29 DIAGNOSIS — I5042 Chronic combined systolic (congestive) and diastolic (congestive) heart failure: Secondary | ICD-10-CM | POA: Diagnosis not present

## 2017-04-29 DIAGNOSIS — I2510 Atherosclerotic heart disease of native coronary artery without angina pectoris: Secondary | ICD-10-CM | POA: Diagnosis not present

## 2017-04-29 DIAGNOSIS — N186 End stage renal disease: Secondary | ICD-10-CM | POA: Insufficient documentation

## 2017-04-29 DIAGNOSIS — Z794 Long term (current) use of insulin: Secondary | ICD-10-CM | POA: Diagnosis not present

## 2017-04-29 DIAGNOSIS — Z992 Dependence on renal dialysis: Secondary | ICD-10-CM | POA: Diagnosis not present

## 2017-04-29 DIAGNOSIS — T82838A Hemorrhage of vascular prosthetic devices, implants and grafts, initial encounter: Secondary | ICD-10-CM | POA: Diagnosis present

## 2017-04-29 DIAGNOSIS — I132 Hypertensive heart and chronic kidney disease with heart failure and with stage 5 chronic kidney disease, or end stage renal disease: Secondary | ICD-10-CM | POA: Diagnosis not present

## 2017-04-29 DIAGNOSIS — T82898A Other specified complication of vascular prosthetic devices, implants and grafts, initial encounter: Secondary | ICD-10-CM | POA: Diagnosis not present

## 2017-04-29 NOTE — Consult Note (Signed)
[facility]ist of [place]  Patient name: [name] Criswell Sr. MRN: [id number] DOB: [birth year] Sex: male    HPI: [name] Allsup Sr. is a 41 y.o. male seen for bleeding from left upper arm AV fistula. He was sent to the emergency room via ambulance due to persistent bleeding from his left upper arm fistula. The patient tells me that he has been using this fistula for 10 years. I do not have documentation of this but do not see any recent notes of revision. He continued to have bleeding in the emergency department and I was consult for evaluation of this. He did have a clamp placed on this when I arrived. He has not had any hypotension  Past Medical History:  Diagnosis Date  . Anemia   . Atherosclerosis of lower extremity ([place])   . CAD (coronary artery disease)   . Chronic combined systolic and diastolic heart failure ([place])   . Complication of anesthesia   . ESRD (end stage renal disease) on dialysis [facility])    "MWF [facility]" (03/08/2017)  . GERD (gastroesophageal reflux disease)   . Heart murmur   . History of blood transfusion    "related to OR"  . Hypertension   . PONV (postoperative nausea and vomiting)   . Type II diabetes mellitus (HCC)     Family History  Problem Relation Age of Onset  . Heart failure Mother   . Hypertension Mother     SOCIAL HISTORY: Social History  Substance Use Topics  . Smoking status: Current Every Day Smoker    Packs/day: 0.50    Years: 26.00    Types: Cigarettes  . Smokeless tobacco: Never Used  . Alcohol use Yes     Comment: 03/08/2017 "haven't took a drink in over 5 years"    Allergies  Allergen Reactions  . Coconut Oil Anaphylaxis    Can use topically, allergic to coconut foods     No current facility-administered medications for this encounter.    Current Outpatient Prescriptions  Medication Sig Dispense Refill  . amLODipine (NORVASC) 10 MG tablet Take 1 tablet (10  mg total) by mouth at bedtime. 30 tablet 0  . calcitRIOL (ROCALTROL) 0.25 MCG capsule Take 9 capsules (2.25 mcg total) by mouth every Monday, Wednesday, and Friday with hemodialysis. (Patient taking differently: Take 2.25 mcg by mouth every Monday, Wednesday, and Friday with hemodialysis. Takes 5 mg with each meal)    . calcium acetate (PHOSLO) 667 MG capsule Take 3 capsules (2,001 mg total) by mouth 3 (three) times daily with meals.    Marland Kitchen acetaminophen (TYLENOL) 500 MG tablet Take 1,000 mg by mouth every 6 (six) hours as needed for mild pain.    . Amino Acids-Protein Hydrolys (FEEDING SUPPLEMENT, PRO-STAT SUGAR FREE 64,) LIQD Take 30 mLs by mouth 2 (two) times daily. 900 mL 0  . carvedilol (COREG) 6.25 MG tablet Take 1 tablet (6.25 mg total) by mouth 2 (two) times daily with a meal. (Patient not taking: Reported on 04/29/2017)    . clopidogrel (PLAVIX) 75 MG tablet Take 1 tablet (75 mg total) by mouth daily. 30 tablet 3  . docusate sodium (COLACE) 100 MG capsule Take 1 capsule (100 mg total) by mouth 2 (two) times daily. 10 capsule 0  . fentaNYL (DURAGESIC - DOSED MCG/HR) 12 MCG/HR Place 1 patch (12.5 mcg total) onto the skin every 3 (three) days. 5 patch 0  . glucagon (GLUCAGON EMERGENCY) 1 MG injection Inject 1 mg  into the vein once as needed (low blood sugar and inability to eat or drink sugar). 1 each 0  . insulin aspart (NOVOLOG) 100 UNIT/ML injection Sliding scale CBG 70 - 120: 0 units CBG 121 - 150: 1 unit,  CBG 151 - 200: 2 units,  CBG 201 - 250: 3 units,  CBG 251 - 300: 5 units,  CBG 301 - 350: 7 units,  CBG 351 - 400: 9 units   CBG > 400: 9 units and notify your MD 10 mL 11  . insulin glargine (LANTUS) 100 UNIT/ML injection Inject 0.07 mLs (7 Units total) into the skin at bedtime. 10 mL 12  . lanthanum (FOSRENOL) 1000 MG chewable tablet Chew 2 tablets (2,000 mg total) by mouth 3 (three) times daily with meals. (Patient taking differently: Chew 1,000 mg by mouth 3 (three) times daily with meals.  May take an additional 1000 - 2000 mgs with snacks) 180 tablet 0  . methocarbamol (ROBAXIN) 500 MG tablet Take 1 tablet (500 mg total) by mouth every 6 (six) hours as needed for muscle spasms.    . multivitamin (RENA-VIT) TABS tablet Take 1 tablet by mouth at bedtime.  0  . nicotine (NICODERM CQ - DOSED IN MG/24 HOURS) 21 mg/24hr patch Place 1 patch (21 mg total) onto the skin daily. 28 patch 0  . oxyCODONE (OXY IR/ROXICODONE) 5 MG immediate release tablet Take 1-2 tablets (5-10 mg total) by mouth every 4 (four) hours as needed for breakthrough pain. 20 tablet 0  . polyethylene glycol (MIRALAX / GLYCOLAX) packet Take 17 g by mouth daily. 14 each 0    REVIEW OF SYSTEMS:  [X]  denotes positive finding, [ ]  denotes negative finding Cardiac  Comments:  Chest pain or chest pressure:    Shortness of breath upon exertion:    Short of breath when lying flat:    Irregular heart rhythm:        Vascular    Pain in calf, thigh, or hip brought on by ambulation:    Pain in feet at night that wakes you up from your sleep:     Blood clot in your veins:    Leg swelling:           PHYSICAL EXAM: Vitals:   04/29/17 1542 04/29/17 1544 04/29/17 1600 04/29/17 1730  BP:  (!) 207/106 (!) 196/101 (!) 198/106  Pulse: 84  82 81  Resp: 16  16   Temp: 98.6 F (37 C)     TempSrc: Oral     SpO2: 98%  95% 98%  Weight:  185 lb (83.9 kg)    Height:  6\' 2"  (1.88 m)      GENERAL: The patient is a well-nourished male, in no acute distress. The vital signs are documented above. CARDIOVASCULAR: Palpable left radial pulse. He does have a palpable thrill in his left upper arm AV fistula. The occlusion clamp was removed. Pressure was held over the cephalic vein near the arteriovenous anastomosis and the dressing that was present was removed. There was no bleeding from either puncture site. The skin was completely intact. The dialysis unit is been doing a very good job rotating needle sites. Even with manipulation there  was no bleeding at all from either puncture site. I occluded the vein or distal to the puncture sites to increased pressure and again there was no bleeding.  PULMONARY: There is good air exchange  MUSCULOSKELETAL: There are no major deformities or cyanosis. NEUROLOGIC: No focal weakness or paresthesias  are detected. SKIN: There are no ulcers or rashes noted. PSYCHIATRIC: The patient has a normal affect.    MEDICAL ISSUES: No ongoing bleeding. No evidence of skin breakdown her risk for bleeding. Discussed this with the patient understands. This had a 4 x 4 and a 4 inch Ace placed slightly over this and was instructed to leave this does Dialysis on Monday. Also discussed this with Dr.Jacubowitz. Field patient is safe to discharge to his nursing facility    Rosetta Posner, MD [facility] Tel [phone] Pager [phone]

## 2017-04-29 NOTE — Discharge Instructions (Signed)
Keep your scheduled plan for hemodialysis on Monday, 05/02/2017.ask to have your blood pressure rechecked on Monday. Today's was elevated [phone]

## 2017-04-29 NOTE — ED Triage Notes (Signed)
Pt from dialysis with ems, received full treatment but staff unable to control bleeding of fistula site. Clamp in place with gauze at this time. Staff reports they have been trying to control the bleeding for 2 hours without success. Pt states he has never had this happen before. Pt a.o, nad, no other complaints.

## 2017-04-29 NOTE — ED Provider Notes (Signed)
[place] DEPT Provider Note   CSN: [id number] Arrival date & time: 04/29/17  1541     History   Chief Complaint Chief Complaint  Patient presents with  . Coagulation Disorder    fistula    HPI [name] Chien Sr. is a 41 y.o. male.  HPI Patient fell bleeding from his dialysis fistula one hour ago when needle was pulled. Bleeding was stopped withreatment by direct pressureover bleeding site. Brought by EMS. He is asymptomatic except feeling "tired"denies other associated symptoms. Patient not currently on blood thinners.no chest pain no shortness of breath no other associated symptoms Past Medical History:  Diagnosis Date  . Anemia   . Atherosclerosis of lower extremity ([place])   . CAD (coronary artery disease)   . Chronic combined systolic and diastolic heart failure ([place])   . Complication of anesthesia   . ESRD (end stage renal disease) on dialysis [facility])    "MWF [facility]" (03/08/2017)  . GERD (gastroesophageal reflux disease)   . Heart murmur   . History of blood transfusion    "related to OR"  . Hypertension   . PONV (postoperative nausea and vomiting)   . Type II diabetes mellitus [facility])     Patient Active Problem List   Diagnosis Date Noted  . S/P unilateral BKA (below knee amputation), left ([place])   . Post-operative pain   . Acute blood loss anemia   . Chronic combined systolic and diastolic CHF (congestive heart failure) ([place])   . PVD (peripheral vascular disease) ([place])   . Coronary artery disease involving native coronary artery of native heart without angina pectoris   . Tobacco abuse   . Diabetic foot infection ([place]) 03/08/2017  . Osteomyelitis ([place]) 03/08/2017  . Wound dehiscence   . Diabetes mellitus with complication ([place])   . Dehiscence of amputation stump ([place]) 01/04/2017  . S/P transmetatarsal amputation of foot, left ([place]) 11/16/2016  . Gangrene of right foot ([place]) 08/02/2016  . Achilles tendon contracture, left 08/02/2016  . Anemia  of renal disease 08/16/2015  . Wound infection 08/15/2015  . Diabetic ulcer of right great toe ([place]) 08/15/2015  . Pain in the chest   . Elevated troponin   . Essential hypertension   . ESRD on dialysis ([place]) 08/07/2013  . Diabetes ([place]) 08/07/2013  . Chest pain 05/16/2012  . Murmur 05/16/2012  . Renal disorder     Past Surgical History:  Procedure Laterality Date  . ABDOMINAL AORTOGRAM N/A 11/02/2016   Procedure: Abdominal Aortogram;  Surgeon: Waynetta Sandy, MD;  Location: [place] CV LAB;  Service: Cardiovascular;  Laterality: N/A;  . AMPUTATION Left 09/27/2013   Procedure: LEFT GREAT TOE AMPUTATION;  Surgeon: [name], MD;  Location: [place];  Service: Orthopedics;  Laterality: Left;  . AMPUTATION Right 08/15/2015   Procedure: Right Great Toe Amputation;  Surgeon: [name], MD;  Location: [place];  Service: Orthopedics;  Laterality: Right;  . AMPUTATION Left 11/05/2016   Procedure: TRANSMETATARSAL AMPUTATION LEFT FOOT;  Surgeon: [name], MD;  Location: [place];  Service: Orthopedics;  Laterality: Left;  . AMPUTATION Left 03/11/2017   Procedure: LEFT BELOW KNEE AMPUTATION;  Surgeon: [name], MD;  Location: [place];  Service: Orthopedics;  Laterality: Left;  . AMPUTATION Right 03/11/2017   Procedure: RIGHT 2ND TOE AMPUTATION;  Surgeon: [name], MD;  Location: [place];  Service: Orthopedics;  Laterality: Right;  . AV FISTULA PLACEMENT  left arm  . LEFT HEART CATHETERIZATION  WITH CORONARY ANGIOGRAM N/A 09/13/2014   Procedure: LEFT HEART CATHETERIZATION WITH CORONARY ANGIOGRAM;  Surgeon: Sinclair Grooms, MD;  Location: [facility] CATH LAB;  Service: Cardiovascular;  Laterality: N/A;  . LOWER EXTREMITY ANGIOGRAPHY Bilateral 11/02/2016   Procedure: Lower Extremity Angiography;  Surgeon: Waynetta Sandy, MD;  Location: [place] CV LAB;  Service: Cardiovascular;  Laterality: Bilateral;  . PERIPHERAL VASCULAR ATHERECTOMY Left 11/02/2016   Procedure:  Peripheral Vascular Atherectomy;  Surgeon: Waynetta Sandy, MD;  Location: [place] CV LAB;  Service: Cardiovascular;  Laterality: Left;  PERONEAL  . STUMP REVISION Left 01/11/2017   Procedure: Revision Left Transmetatarsal Amputation;  Surgeon: [name], MD;  Location: [place];  Service: Orthopedics;  Laterality: Left;       Home Medications    Prior to Admission medications   Medication Sig Start Date End Date Taking? Authorizing Provider  acetaminophen (TYLENOL) 500 MG tablet Take 1,000 mg by mouth every 6 (six) hours as needed for mild pain.    [provider]  Amino Acids-Protein Hydrolys (FEEDING SUPPLEMENT, PRO-STAT SUGAR FREE 64,) LIQD Take 30 mLs by mouth 2 (two) times daily. 03/17/17   Rai, Ripudeep K, MD  amLODipine (NORVASC) 10 MG tablet Take 1 tablet (10 mg total) by mouth at bedtime. 08/16/15   [name], MD  calcitRIOL (ROCALTROL) 0.25 MCG capsule Take 9 capsules (2.25 mcg total) by mouth every Monday, Wednesday, and Friday with hemodialysis. 03/18/17   Rai, Vernelle Emerald, MD  calcium acetate (PHOSLO) 667 MG capsule Take 3 capsules (2,001 mg total) by mouth 3 (three) times daily with meals. 03/17/17   Rai, Vernelle Emerald, MD  carvedilol (COREG) 6.25 MG tablet Take 1 tablet (6.25 mg total) by mouth 2 (two) times daily with a meal. 03/17/17   Rai, Ripudeep K, MD  clopidogrel (PLAVIX) 75 MG tablet Take 1 tablet (75 mg total) by mouth daily. 11/02/16   Waynetta Sandy, MD  docusate sodium (COLACE) 100 MG capsule Take 1 capsule (100 mg total) by mouth 2 (two) times daily. 03/17/17   Rai, Ripudeep K, MD  fentaNYL (DURAGESIC - DOSED MCG/HR) 12 MCG/HR Place 1 patch (12.5 mcg total) onto the skin every 3 (three) days. 03/18/17   Rai, Ripudeep Raliegh Ip, MD  glucagon (GLUCAGON EMERGENCY) 1 MG injection Inject 1 mg into the vein once as needed (low blood sugar and inability to eat or drink sugar). 08/16/15   [name], MD  insulin aspart (NOVOLOG) 100 UNIT/ML injection  Sliding scale CBG 70 - 120: 0 units CBG 121 - 150: 1 unit,  CBG 151 - 200: 2 units,  CBG 201 - 250: 3 units,  CBG 251 - 300: 5 units,  CBG 301 - 350: 7 units,  CBG 351 - 400: 9 units   CBG > 400: 9 units and notify your MD 03/17/17   Rai, Vernelle Emerald, MD  insulin glargine (LANTUS) 100 UNIT/ML injection Inject 0.07 mLs (7 Units total) into the skin at bedtime. 03/17/17   Rai, Vernelle Emerald, MD  lanthanum (FOSRENOL) 1000 MG chewable tablet Chew 2 tablets (2,000 mg total) by mouth 3 (three) times daily with meals. Patient taking differently: Chew 1,000 mg by mouth 3 (three) times daily with meals. May take an additional 1000 - 2000 mgs with snacks 08/16/15   [name], MD  methocarbamol (ROBAXIN) 500 MG tablet Take 1 tablet (500 mg total) by mouth every 6 (six) hours as needed for muscle spasms. 03/17/17   Mendel Corning, MD  multivitamin (RENA-VIT) TABS tablet Take 1 tablet by mouth at bedtime. 03/17/17   Rai, Ripudeep K, MD  nicotine (NICODERM CQ - DOSED IN MG/24 HOURS) 21 mg/24hr patch Place 1 patch (21 mg total) onto the skin daily. 03/17/17   Rai, Ripudeep K, MD  oxyCODONE (OXY IR/ROXICODONE) 5 MG immediate release tablet Take 1-2 tablets (5-10 mg total) by mouth every 4 (four) hours as needed for breakthrough pain. 03/17/17   Rai, Vernelle Emerald, MD  polyethylene glycol (MIRALAX / GLYCOLAX) packet Take 17 g by mouth daily. 03/17/17   Mendel Corning, MD    Family History Family History  Problem Relation Age of Onset  . Heart failure Mother   . Hypertension Mother     Social History Social History  Substance Use Topics  . Smoking status: Current Every Day Smoker    Packs/day: 0.50    Years: 26.00    Types: Cigarettes  . Smokeless tobacco: Never Used  . Alcohol use Yes     Comment: 03/08/2017 "haven't took a drink in over 5 years"     Allergies   Coconut oil   Review of Systems Review of Systems  Allergic/Immunologic: Positive for immunocompromised state.       Hemodialysis patient  All  other systems reviewed and are negative.    Physical Exam Updated Vital Signs BP (!) 196/101   Pulse 82   Temp 98.6 F (37 C) (Oral)   Resp 16   Ht 6\' 2"  (1.88 m)   Wt 83.9 kg (185 lb)   SpO2 95%   BMI 23.75 kg/m   Physical Exam  Constitutional: He appears well-developed and well-nourished.  HENT:  Head: Normocephalic and atraumatic.  Eyes: Pupils are equal, round, and reactive to light. Conjunctivae are normal.  Neck: Neck supple. No tracheal deviation present. No thyromegaly present.  Cardiovascular: Normal rate and regular rhythm.   No murmur heard. Pulmonary/Chest: Effort normal and breath sounds normal.  Abdominal: Soft. Bowel sounds are normal. He exhibits no distension. There is no tenderness.  Musculoskeletal: Normal range of motion. He exhibits no edema or tenderness.  Dialysis fistula left upper extremity bleeding controlled with direct pressure. Pressure clamp was removed and bandage was removed to reveal active brisk bleeding. Pressure dressing andclamp were reapplied bleeding stopped.  Neurological: He is alert. Coordination normal.  Skin: Skin is warm and dry. No rash noted.  Psychiatric: He has a normal mood and affect.  Nursing note and vitals reviewed.    ED Treatments / Results  Labs (all labs ordered are listed, but only abnormal results are displayed) Labs Reviewed - No data to display  EKG  EKG Interpretation None       Radiology No results found.  Procedures Procedures (including critical care time)  Medications Ordered in ED Medications - No data to display   Initial Impression / Assessment and Plan / ED Course  I have reviewed the triage vital signs and the nursing notes.  Pertinent labs & imaging results that were available during my care of the patient were reviewed by me and considered in my medical decision making (see chart for details).   Dr. Tawni Millers from vascular surgery was consulted and will come to evaluate patient  Dr.  Tawni Millers evaluated patient. No further bleeding from dialysis fistula site. Plan keep scheduled plan for hemodialysisMonday, 05/02/2017. Blood pressure recheck 6:20 PM patient asymptomatic Final Clinical Impressions(s) / ED Diagnoses  Diagnosis #1 bleeding from dialysis fistula # 2 elevated blood pressure Final diagnoses:  None   New Prescriptions New Prescriptions   No medications on file     Orlie Dakin, MD 04/29/17 [phone]

## 2017-04-29 NOTE — ED Notes (Signed)
[facility] contacted to transport patient to [facility]

## 2017-05-24 ENCOUNTER — Ambulatory Visit (INDEPENDENT_AMBULATORY_CARE_PROVIDER_SITE_OTHER): Payer: Medicaid Other | Admitting: Orthopedic Surgery

## 2017-06-04 ENCOUNTER — Encounter (HOSPITAL_COMMUNITY): Payer: Self-pay | Admitting: Emergency Medicine

## 2017-06-04 ENCOUNTER — Observation Stay (HOSPITAL_COMMUNITY)
Admission: EM | Admit: 2017-06-04 | Discharge: 2017-06-05 | Disposition: A | Discharge status: Skilled Nursing Facility | Payer: Medicaid Other | Attending: Internal Medicine | Admitting: Internal Medicine

## 2017-06-04 ENCOUNTER — Emergency Department (HOSPITAL_COMMUNITY): Payer: Medicaid Other

## 2017-06-04 DIAGNOSIS — J9601 Acute respiratory failure with hypoxia: Secondary | ICD-10-CM | POA: Insufficient documentation

## 2017-06-04 DIAGNOSIS — E119 Type 2 diabetes mellitus without complications: Secondary | ICD-10-CM

## 2017-06-04 DIAGNOSIS — E1122 Type 2 diabetes mellitus with diabetic chronic kidney disease: Secondary | ICD-10-CM | POA: Insufficient documentation

## 2017-06-04 DIAGNOSIS — I5043 Acute on chronic combined systolic (congestive) and diastolic (congestive) heart failure: Secondary | ICD-10-CM | POA: Diagnosis not present

## 2017-06-04 DIAGNOSIS — I2510 Atherosclerotic heart disease of native coronary artery without angina pectoris: Secondary | ICD-10-CM | POA: Diagnosis not present

## 2017-06-04 DIAGNOSIS — Z79899 Other long term (current) drug therapy: Secondary | ICD-10-CM | POA: Diagnosis not present

## 2017-06-04 DIAGNOSIS — R0603 Acute respiratory distress: Secondary | ICD-10-CM

## 2017-06-04 DIAGNOSIS — J810 Acute pulmonary edema: Secondary | ICD-10-CM | POA: Diagnosis present

## 2017-06-04 DIAGNOSIS — I10 Essential (primary) hypertension: Secondary | ICD-10-CM | POA: Diagnosis not present

## 2017-06-04 DIAGNOSIS — I132 Hypertensive heart and chronic kidney disease with heart failure and with stage 5 chronic kidney disease, or end stage renal disease: Secondary | ICD-10-CM | POA: Diagnosis not present

## 2017-06-04 DIAGNOSIS — Z794 Long term (current) use of insulin: Secondary | ICD-10-CM | POA: Insufficient documentation

## 2017-06-04 DIAGNOSIS — N186 End stage renal disease: Secondary | ICD-10-CM

## 2017-06-04 DIAGNOSIS — K219 Gastro-esophageal reflux disease without esophagitis: Secondary | ICD-10-CM | POA: Diagnosis not present

## 2017-06-04 DIAGNOSIS — I161 Hypertensive emergency: Secondary | ICD-10-CM | POA: Diagnosis present

## 2017-06-04 DIAGNOSIS — D631 Anemia in chronic kidney disease: Secondary | ICD-10-CM | POA: Insufficient documentation

## 2017-06-04 DIAGNOSIS — Z992 Dependence on renal dialysis: Secondary | ICD-10-CM | POA: Insufficient documentation

## 2017-06-04 LAB — CBC WITH DIFFERENTIAL/PLATELET
Basophils Absolute: 0 10*3/uL (ref 0.0–0.1)
Basophils Relative: 0 %
Eosinophils Absolute: 0.1 10*3/uL (ref 0.0–0.7)
Eosinophils Relative: 1 %
HCT: 37 % — ABNORMAL LOW (ref 39.0–52.0)
Hemoglobin: 12.1 g/dL — ABNORMAL LOW (ref 13.0–17.0)
Lymphocytes Relative: 20 %
Lymphs Abs: 1.8 10*3/uL (ref 0.7–4.0)
MCH: 26.9 pg (ref 26.0–34.0)
MCHC: 32.7 g/dL (ref 30.0–36.0)
MCV: 82.2 fL (ref 78.0–100.0)
Monocytes Absolute: 0.3 10*3/uL (ref 0.1–1.0)
Monocytes Relative: 3 %
Neutro Abs: 7 10*3/uL (ref 1.7–7.7)
Neutrophils Relative %: 76 %
Platelets: 271 10*3/uL (ref 150–400)
RBC: 4.5 MIL/uL (ref 4.22–5.81)
RDW: 20.9 % — ABNORMAL HIGH (ref 11.5–15.5)
WBC: 9.3 10*3/uL (ref 4.0–10.5)

## 2017-06-04 LAB — GLUCOSE, CAPILLARY: Glucose-Capillary: 175 mg/dL — ABNORMAL HIGH (ref 65–99)

## 2017-06-04 LAB — I-STAT TROPONIN, ED: Troponin i, poc: 0.05 ng/mL (ref 0.00–0.08)

## 2017-06-04 LAB — BASIC METABOLIC PANEL
Anion gap: 19 — ABNORMAL HIGH (ref 5–15)
BUN: 25 mg/dL — ABNORMAL HIGH (ref 6–20)
CO2: 24 mmol/L (ref 22–32)
Calcium: 9.6 mg/dL (ref 8.9–10.3)
Chloride: 94 mmol/L — ABNORMAL LOW (ref 101–111)
Creatinine, Ser: 6.16 mg/dL — ABNORMAL HIGH (ref 0.61–1.24)
GFR calc Af Amer: 12 mL/min — ABNORMAL LOW (ref >60)
GFR calc non Af Amer: 10 mL/min — ABNORMAL LOW (ref >60)
Glucose, Bld: 209 mg/dL — ABNORMAL HIGH (ref 65–99)
Potassium: 3.4 mmol/L — ABNORMAL LOW (ref 3.5–5.1)
Sodium: 137 mmol/L (ref 135–145)

## 2017-06-04 LAB — TYPE AND SCREEN
ABO/RH(D): B POS
Antibody Screen: NEGATIVE

## 2017-06-04 LAB — BRAIN NATRIURETIC PEPTIDE: B Natriuretic Peptide: >4500 pg/mL — ABNORMAL HIGH (ref 0.0–100.0)

## 2017-06-04 LAB — I-STAT VENOUS BLOOD GAS, ED
Acid-Base Excess: 3 mmol/L — ABNORMAL HIGH (ref 0.0–2.0)
Bicarbonate: 28.1 mmol/L — ABNORMAL HIGH (ref 20.0–28.0)
O2 Saturation: 85 %
TCO2: 29 mmol/L (ref 22–32)
pCO2, Ven: 42.9 mmHg — ABNORMAL LOW (ref 44.0–60.0)
pH, Ven: 7.425 (ref 7.250–7.430)
pO2, Ven: 49 mmHg — ABNORMAL HIGH (ref 32.0–45.0)

## 2017-06-04 LAB — I-STAT CG4 LACTIC ACID, ED: Lactic Acid, Venous: 4.34 mmol/L (ref 0.5–1.9)

## 2017-06-04 LAB — LACTIC ACID, PLASMA: Lactic Acid, Venous: 1.6 mmol/L (ref 0.5–1.9)

## 2017-06-04 MED ORDER — NITROGLYCERIN IN D5W 200-5 MCG/ML-% IV SOLN
0.0000 ug/min | Freq: Once | INTRAVENOUS | Status: AC
  Administered 2017-06-04: 10 ug/min via INTRAVENOUS

## 2017-06-04 MED ORDER — LAMOTRIGINE 50 MG PO TBDP: 125.0000 mg | ORAL_TABLET | Freq: Every day | ORAL | Status: DC

## 2017-06-04 MED ORDER — NITROGLYCERIN 0.4 MG SL SUBL
SUBLINGUAL_TABLET | SUBLINGUAL | Status: AC
  Filled 2017-06-04: qty 1

## 2017-06-04 MED ORDER — HEPARIN SODIUM (PORCINE) 5000 UNIT/ML IJ SOLN
5000.0000 [IU] | Freq: Three times a day (TID) | INTRAMUSCULAR | Status: DC
  Administered 2017-06-04: 5000 [IU] via SUBCUTANEOUS
  Filled 2017-06-04: qty 1

## 2017-06-04 MED ORDER — LAMOTRIGINE 25 MG PO TABS
125.0000 mg | ORAL_TABLET | Freq: Every day | ORAL | Status: DC
  Administered 2017-06-04: 125 mg via ORAL
  Filled 2017-06-04 (×2): qty 1

## 2017-06-04 MED ORDER — ACETAMINOPHEN 500 MG PO TABS: 1000.0000 mg | ORAL_TABLET | Freq: Four times a day (QID) | ORAL | Status: DC | PRN

## 2017-06-04 MED ORDER — RENA-VITE PO TABS
1.0000 | ORAL_TABLET | Freq: Every day | ORAL | Status: DC
  Administered 2017-06-04: 1 via ORAL
  Filled 2017-06-04: qty 1

## 2017-06-04 MED ORDER — SODIUM CHLORIDE 0.9% FLUSH
3.0000 mL | Freq: Two times a day (BID) | INTRAVENOUS | Status: DC
  Administered 2017-06-04: 3 mL via INTRAVENOUS

## 2017-06-04 MED ORDER — POLYETHYLENE GLYCOL 3350 17 G PO PACK
17.0000 g | PACK | Freq: Every day | ORAL | Status: DC
  Filled 2017-06-04: qty 1

## 2017-06-04 MED ORDER — HYDRALAZINE HCL 20 MG/ML IJ SOLN
10.0000 mg | Freq: Four times a day (QID) | INTRAMUSCULAR | Status: DC | PRN
  Administered 2017-06-04 – 2017-06-05 (×2): 10 mg via INTRAVENOUS
  Filled 2017-06-04 (×2): qty 1

## 2017-06-04 MED ORDER — RENA-VITE PO TABS: 1.0000 | ORAL_TABLET | Freq: Every day | ORAL | Status: DC

## 2017-06-04 MED ORDER — LORAZEPAM 2 MG/ML IJ SOLN
1.0000 mg | Freq: Once | INTRAMUSCULAR | Status: DC
  Filled 2017-06-04: qty 1

## 2017-06-04 MED ORDER — NITROGLYCERIN 2 % TD OINT: 1.0000 [in_us] | TOPICAL_OINTMENT | Freq: Once | TRANSDERMAL | Status: AC

## 2017-06-04 MED ORDER — NITROGLYCERIN IN D5W 200-5 MCG/ML-% IV SOLN
INTRAVENOUS | Status: AC
  Administered 2017-06-04: 10 ug/min via INTRAVENOUS
  Filled 2017-06-04: qty 250

## 2017-06-04 MED ORDER — AMLODIPINE BESYLATE 10 MG PO TABS
10.0000 mg | ORAL_TABLET | Freq: Every day | ORAL | Status: DC
  Administered 2017-06-04: 10 mg via ORAL
  Filled 2017-06-04: qty 1

## 2017-06-04 MED ORDER — INSULIN GLARGINE 100 UNIT/ML ~~LOC~~ SOLN
4.0000 [IU] | Freq: Every day | SUBCUTANEOUS | Status: DC
  Filled 2017-06-04 (×2): qty 0.04

## 2017-06-04 MED ORDER — DOCUSATE SODIUM 100 MG PO CAPS
100.0000 mg | ORAL_CAPSULE | Freq: Two times a day (BID) | ORAL | Status: DC
  Administered 2017-06-04 – 2017-06-05 (×2): 100 mg via ORAL
  Filled 2017-06-04 (×2): qty 1

## 2017-06-04 MED ORDER — METHOCARBAMOL 500 MG PO TABS: 500.0000 mg | ORAL_TABLET | Freq: Four times a day (QID) | ORAL | Status: DC | PRN

## 2017-06-04 MED ORDER — CALCITRIOL 0.5 MCG PO CAPS: 2.2500 ug | ORAL_CAPSULE | ORAL | Status: DC

## 2017-06-04 MED ORDER — NITROGLYCERIN 0.4 MG SL SUBL
0.4000 mg | SUBLINGUAL_TABLET | SUBLINGUAL | Status: DC | PRN
  Administered 2017-06-04: 0.4 mg via SUBLINGUAL

## 2017-06-04 MED ORDER — FUROSEMIDE 20 MG PO TABS
20.0000 mg | ORAL_TABLET | Freq: Two times a day (BID) | ORAL | Status: DC
  Administered 2017-06-04 – 2017-06-05 (×2): 20 mg via ORAL
  Filled 2017-06-04 (×2): qty 1

## 2017-06-04 MED ORDER — IPRATROPIUM-ALBUTEROL 0.5-2.5 (3) MG/3ML IN SOLN: 3.0000 mL | Freq: Once | RESPIRATORY_TRACT | Status: DC

## 2017-06-04 MED ORDER — ONDANSETRON HCL 4 MG PO TABS
4.0000 mg | ORAL_TABLET | Freq: Three times a day (TID) | ORAL | Status: DC | PRN
  Administered 2017-06-04: 4 mg via ORAL
  Filled 2017-06-04: qty 1

## 2017-06-04 MED ORDER — LANTHANUM CARBONATE 500 MG PO CHEW
2000.0000 mg | CHEWABLE_TABLET | Freq: Three times a day (TID) | ORAL | Status: DC
  Administered 2017-06-05: 2000 mg via ORAL
  Filled 2017-06-04 (×2): qty 4

## 2017-06-04 MED ORDER — PRO-STAT SUGAR FREE PO LIQD
30.0000 mL | Freq: Two times a day (BID) | ORAL | Status: DC
  Administered 2017-06-04: 30 mL via ORAL
  Filled 2017-06-04 (×2): qty 30

## 2017-06-04 MED ORDER — CARVEDILOL 6.25 MG PO TABS
6.2500 mg | ORAL_TABLET | Freq: Two times a day (BID) | ORAL | Status: DC
  Administered 2017-06-05 (×2): 6.25 mg via ORAL
  Filled 2017-06-04 (×2): qty 1

## 2017-06-04 MED ORDER — OXYCODONE HCL 5 MG PO TABS: 5.0000 mg | ORAL_TABLET | ORAL | Status: DC | PRN

## 2017-06-04 MED ORDER — CLOPIDOGREL BISULFATE 75 MG PO TABS
75.0000 mg | ORAL_TABLET | Freq: Every day | ORAL | Status: DC
  Administered 2017-06-04 – 2017-06-05 (×2): 75 mg via ORAL
  Filled 2017-06-04 (×2): qty 1

## 2017-06-04 NOTE — Procedures (Signed)
   I was present at this dialysis session, have reviewed the session itself and made  appropriate changes Kelly Splinter MD [place] pager [phone]   06/04/2017, 3:32 PM

## 2017-06-04 NOTE — Procedures (Signed)
Pt taken off Bipap at this time and placed on 3L [place] tolerating well, no distress noted at this time. Spo2 96%, RN aware.

## 2017-06-04 NOTE — ED Provider Notes (Signed)
[facility] EMERGENCY DEPARTMENT Provider Note   CSN: [id number] Arrival date & time: 06/04/17  7989     History   Chief Complaint Chief Complaint  Patient presents with  . Shortness of Breath    HPI [name] Wuertz Sr. is a 41 y.o. male with history of CAD, ESRD on dialysis MWF, hypertension, diabetes, CHF who presents in respiratory distress.  He reports he woke up this morning and all of a sudden at 3 am began having significant shortness of breath.  He is also very anxious.  He reports he was feeling well yesterday.  He had dialysis as scheduled and 500 mL was pulled off.  He denies any chest pain.  He has spitting up pink  sputum.  He denies any abdominal pain, nausea or vomiting.  No edema noted.  He presents from SNF.   HPI  Past Medical History:  Diagnosis Date  . Anemia   . Atherosclerosis of lower extremity ([place])   . CAD (coronary artery disease)   . Chronic combined systolic and diastolic heart failure ([place])   . Complication of anesthesia   . ESRD (end stage renal disease) on dialysis [facility])    "MWF [employer]" (03/08/2017)  . GERD (gastroesophageal reflux disease)   . Heart murmur   . History of blood transfusion    "related to OR"  . Hypertension   . PONV (postoperative nausea and vomiting)   . Type II diabetes mellitus [facility])     Patient Active Problem List   Diagnosis Date Noted  . S/P unilateral BKA (below knee amputation), left ([place])   . Post-operative pain   . Acute blood loss anemia   . Chronic combined systolic and diastolic CHF (congestive heart failure) ([place])   . PVD (peripheral vascular disease) ([place])   . Coronary artery disease involving native coronary artery of native heart without angina pectoris   . Tobacco abuse   . Diabetic foot infection ([place]) 03/08/2017  . Osteomyelitis ([place]) 03/08/2017  . Wound dehiscence   . Diabetes mellitus with complication ([place])   . Dehiscence of amputation stump ([place]) 01/04/2017  . S/P  transmetatarsal amputation of foot, left ([place]) 11/16/2016  . Gangrene of right foot ([place]) 08/02/2016  . Achilles tendon contracture, left 08/02/2016  . Anemia of renal disease 08/16/2015  . Wound infection 08/15/2015  . Diabetic ulcer of right great toe ([place]) 08/15/2015  . Pain in the chest   . Elevated troponin   . Essential hypertension   . ESRD on dialysis ([place]) 08/07/2013  . Diabetes ([place]) 08/07/2013  . Chest pain 05/16/2012  . Murmur 05/16/2012  . Renal disorder     Past Surgical History:  Procedure Laterality Date  . ABDOMINAL AORTOGRAM N/A 11/02/2016   Procedure: Abdominal Aortogram;  Surgeon: Waynetta Sandy, MD;  Location: [place] CV LAB;  Service: Cardiovascular;  Laterality: N/A;  . AMPUTATION Left 09/27/2013   Procedure: LEFT GREAT TOE AMPUTATION;  Surgeon: [name], MD;  Location: [place];  Service: Orthopedics;  Laterality: Left;  . AMPUTATION Right 08/15/2015   Procedure: Right Great Toe Amputation;  Surgeon: [name], MD;  Location: [place];  Service: Orthopedics;  Laterality: Right;  . AMPUTATION Left 11/05/2016   Procedure: TRANSMETATARSAL AMPUTATION LEFT FOOT;  Surgeon: [name], MD;  Location: [place];  Service: Orthopedics;  Laterality: Left;  . AMPUTATION Left 03/11/2017   Procedure: LEFT BELOW KNEE AMPUTATION;  Surgeon: [name], MD;  Location[facility]  OR;  Service: Orthopedics;  Laterality: Left;  . AMPUTATION Right 03/11/2017   Procedure: RIGHT 2ND TOE AMPUTATION;  Surgeon: [name], MD;  Location: [place];  Service: Orthopedics;  Laterality: Right;  . AV FISTULA PLACEMENT  left arm  . LEFT HEART CATHETERIZATION WITH CORONARY ANGIOGRAM N/A 09/13/2014   Procedure: LEFT HEART CATHETERIZATION WITH CORONARY ANGIOGRAM;  Surgeon: Sinclair Grooms, MD;  Location[facility] C[facility] LAB;  Service: Cardiovascular;  Laterality: N/A;  . LOWER EXTREMITY ANGIOGRAPHY Bilateral 11/02/2016   Procedure: Lower Extremity Angiography;  Surgeon: Waynetta Sandy, MD;  Location: [place] CV LAB;  Service: Cardiovascular;  Laterality: Bilateral;  . PERIPHERAL VASCULAR ATHERECTOMY Left 11/02/2016   Procedure: Peripheral Vascular Atherectomy;  Surgeon: Waynetta Sandy, MD;  Location: [place] CV LAB;  Service: Cardiovascular;  Laterality: Left;  PERONEAL  . STUMP REVISION Left 01/11/2017   Procedure: Revision Left Transmetatarsal Amputation;  Surgeon: [name], MD;  Location: [place];  Service: Orthopedics;  Laterality: Left;       Home Medications    Prior to Admission medications   Medication Sig Start Date End Date Taking? Authorizing Provider  acetaminophen (TYLENOL) 500 MG tablet Take 1,000 mg by mouth every 6 (six) hours as needed for mild pain.   Yes [provider]  Amino Acids-Protein Hydrolys (FEEDING SUPPLEMENT, PRO-STAT SUGAR FREE 64,) LIQD Take 30 mLs by mouth 2 (two) times daily. 03/17/17  Yes Rai, Ripudeep K, MD  amLODipine (NORVASC) 10 MG tablet Take 1 tablet (10 mg total) by mouth at bedtime. 08/16/15  Yes Short, Noah Delaine, MD  b complex-vitamin c-folic acid (NEPHRO-VITE) 0.8 MG TABS tablet Take 1 tablet by mouth at bedtime.   Yes [provider]  calcitRIOL (ROCALTROL) 0.25 MCG capsule Take 9 capsules (2.25 mcg total) by mouth every Monday, Wednesday, and Friday with hemodialysis. Patient taking differently: Take 2.25 mcg by mouth See admin instructions. Take 9 capsules (2.25 mcg totally) by mouth every Mon - Wed - Fri. 03/18/17  Yes Rai, Ripudeep K, MD  calcium acetate (PHOSLO) 667 MG capsule Take 3 capsules (2,001 mg total) by mouth 3 (three) times daily with meals. Patient taking differently: Take 1,334 mg by mouth 3 (three) times daily with meals.  03/17/17  Yes Rai, Ripudeep K, MD  carvedilol (COREG) 6.25 MG tablet Take 1 tablet (6.25 mg total) by mouth 2 (two) times daily with a meal. 03/17/17  Yes Rai, Ripudeep K, MD  clopidogrel (PLAVIX) 75 MG tablet Take 1 tablet (75 mg total) by mouth daily.  11/02/16  Yes Waynetta Sandy, MD  docusate sodium (COLACE) 100 MG capsule Take 1 capsule (100 mg total) by mouth 2 (two) times daily. 03/17/17  Yes Rai, Ripudeep K, MD  fentaNYL (DURAGESIC - DOSED MCG/HR) 12 MCG/HR Place 1 patch (12.5 mcg total) onto the skin every 3 (three) days. 03/18/17  Yes Rai, Ripudeep K, MD  furosemide (LASIX) 20 MG tablet Take 20 mg by mouth 2 (two) times daily.   Yes [provider]  glucagon (GLUCAGON EMERGENCY) 1 MG injection Inject 1 mg into the vein once as needed (low blood sugar and inability to eat or drink sugar). 08/16/15  Yes Short, Noah Delaine, MD  insulin glargine (LANTUS) 100 UNIT/ML injection Inject 0.07 mLs (7 Units total) into the skin at bedtime. Patient taking differently: Inject 4 Units into the skin at bedtime.  03/17/17  Yes Rai, Ripudeep K, MD  ipratropium-albuterol (DUONEB) 0.5-2.5 (3) MG/3ML SOLN Take 3 mLs by  nebulization every 6 (six) hours as needed (Shortness of breath).   Yes [provider]  LamoTRIgine 50 MG TBDP Take 125 mg by mouth at bedtime.    Yes [provider]  lanthanum (FOSRENOL) 1000 MG chewable tablet Chew 2 tablets (2,000 mg total) by mouth 3 (three) times daily with meals. 08/16/15  Yes Short, Noah Delaine, MD  nicotine (NICODERM CQ - DOSED IN MG/24 HOURS) 21 mg/24hr patch Place 1 patch (21 mg total) onto the skin daily. 03/17/17  Yes Rai, Ripudeep K, MD  ondansetron (ZOFRAN) 4 MG tablet Take 4 mg by mouth every 8 (eight) hours as needed for nausea or vomiting.   Yes [provider]  oxyCODONE (OXY IR/ROXICODONE) 5 MG immediate release tablet Take 1-2 tablets (5-10 mg total) by mouth every 4 (four) hours as needed for breakthrough pain. Patient taking differently: Take 5-10 mg by mouth every 4 (four) hours as needed for moderate pain.  03/17/17  Yes Rai, Ripudeep K, MD  polyethylene glycol (MIRALAX / GLYCOLAX) packet Take 17 g by mouth daily. 03/17/17  Yes Rai, Ripudeep K, MD  insulin aspart (NOVOLOG)  100 UNIT/ML injection Sliding scale CBG 70 - 120: 0 units CBG 121 - 150: 1 unit,  CBG 151 - 200: 2 units,  CBG 201 - 250: 3 units,  CBG 251 - 300: 5 units,  CBG 301 - 350: 7 units,  CBG 351 - 400: 9 units   CBG > 400: 9 units and notify your MD Patient not taking: Reported on 06/04/2017 03/17/17   Rai, Vernelle Emerald, MD  methocarbamol (ROBAXIN) 500 MG tablet Take 1 tablet (500 mg total) by mouth every 6 (six) hours as needed for muscle spasms. Patient not taking: Reported on 06/04/2017 03/17/17   Mendel Corning, MD  multivitamin (RENA-VIT) TABS tablet Take 1 tablet by mouth at bedtime. Patient not taking: Reported on 04/29/2017 03/17/17   Mendel Corning, MD    Family History Family History  Problem Relation Age of Onset  . Heart failure Mother   . Hypertension Mother     Social History Social History  Substance Use Topics  . Smoking status: Current Every Day Smoker    Packs/day: 0.50    Years: 26.00    Types: Cigarettes  . Smokeless tobacco: Never Used  . Alcohol use Yes     Comment: 03/08/2017 "haven't took a drink in over 5 years"     Allergies   Coconut oil   Review of Systems Review of Systems  Constitutional: Negative for chills and fever.  HENT: Negative for facial swelling and sore throat.   Respiratory: Positive for cough and shortness of breath.   Cardiovascular: Negative for chest pain.  Gastrointestinal: Negative for abdominal pain, nausea and vomiting.  Genitourinary: Negative for dysuria.  Musculoskeletal: Negative for back pain.  Skin: Negative for rash and wound.  Neurological: Negative for headaches.  Psychiatric/Behavioral: The patient is nervous/anxious.      Physical Exam Updated Vital Signs BP (!) 182/52   Pulse 99   Temp 97.9 F (36.6 C) (Oral)   Resp (!) 28   SpO2 98%   Physical Exam  Constitutional: He appears well-developed and well-nourished. No distress.  Patient initially in distress; beginning to calm and breathe more slowly after 5L    HENT:  Head: Normocephalic and atraumatic.  Mouth/Throat: Oropharynx is clear and moist. No oropharyngeal exudate.  Eyes: Pupils are equal, round, and reactive to light. Conjunctivae are normal. Right eye exhibits no discharge.  Left eye exhibits no discharge. No scleral icterus.  Neck: Normal range of motion. Neck supple. No thyromegaly present.  Cardiovascular: Normal rate, regular rhythm, normal heart sounds and intact distal pulses.  Exam reveals no gallop and no friction rub.   No murmur heard. Pulmonary/Chest: Effort normal. No stridor. No respiratory distress. He has decreased breath sounds. He has no wheezes. He has rales in the right upper field, the right middle field, the right lower field, the left upper field, the left middle field and the left lower field.  Abdominal: Soft. Bowel sounds are normal. He exhibits no distension. There is no tenderness. There is no rebound and no guarding.  Musculoskeletal: He exhibits no edema.  Lymphadenopathy:    He has no cervical adenopathy.  Neurological: He is alert. Coordination normal.  Skin: Skin is warm and dry. No rash noted. He is not diaphoretic. No pallor.  Psychiatric: He has a normal mood and affect.  Nursing note and vitals reviewed.    ED Treatments / Results  Labs (all labs ordered are listed, but only abnormal results are displayed) Labs Reviewed  BASIC METABOLIC PANEL - Abnormal; Notable for the following:       Result Value   Potassium 3.4 (*)    Chloride 94 (*)    Glucose, Bld 209 (*)    BUN 25 (*)    Creatinine, Ser 6.16 (*)    GFR calc non Af Amer 10 (*)    GFR calc Af Amer 12 (*)    Anion gap 19 (*)    All other components within normal limits  CBC WITH DIFFERENTIAL/PLATELET - Abnormal; Notable for the following:    Hemoglobin 12.1 (*)    HCT 37.0 (*)    RDW 20.9 (*)    All other components within normal limits  BRAIN NATRIURETIC PEPTIDE - Abnormal; Notable for the following:    B Natriuretic Peptide  >4,500.0 (*)    All other components within normal limits  I-STAT VENOUS BLOOD GAS, ED - Abnormal; Notable for the following:    pCO2, Ven 42.9 (*)    pO2, Ven 49.0 (*)    Bicarbonate 28.1 (*)    Acid-Base Excess 3.0 (*)    All other components within normal limits  I-STAT CG4 LACTIC ACID, ED - Abnormal; Notable for the following:    Lactic Acid, Venous 4.34 (*)    All other components within normal limits  I-STAT TROPONIN, ED  I-STAT CG4 LACTIC ACID, ED    EKG  EKG Interpretation  Date/Time:  Saturday June 04 2017 09:53:20 EDT Ventricular Rate:  128 PR Interval:    QRS Duration: 106 QT Interval:  339 QTC Calculation: 495 R Axis:   81 Text Interpretation:  Second degree AV block, Mobitz II Nonspecific repol abnormality, lateral leads Artifact in lead(s) I II III aVR aVL aVF V4 V5 and baseline wander in lead(s) V3 Interpretation limited secondary to artifact tachycardia new from previous Confirmed by Theotis Burrow [phone]) on 06/04/2017 10:03:27 AM       Radiology Dg Chest 1 View  Result Date: 06/04/2017 CLINICAL DATA:  Acute respiratory distress. Severe shortness of breath. EXAM: CHEST 1 VIEW COMPARISON:  05/31/2015 FINDINGS: Mild cardiomegaly. Moderate diffuse symmetric bilateral airspace disease is seen, highly suspicious for diffuse pulmonary edema. Small right pleural effusion also noted. IMPRESSION: Findings consistent with moderate congestive heart failure. Electronically Signed   By: Earle Gell M.D.   On: 06/04/2017 10:16    Procedures Procedures (including critical care  time)  CRITICAL CARE Performed by: Frederica Kuster   Total critical care time: 30 minutes  Critical care time was exclusive of separately billable procedures and treating other patients.  Critical care was necessary to treat or prevent imminent or life-threatening deterioration.  Critical care was time spent personally by me on the following activities: development of treatment plan with  patient and/or surrogate as well as nursing, discussions with consultants, evaluation of patient's response to treatment, examination of patient, obtaining history from patient or surrogate, ordering and performing treatments and interventions, ordering and review of laboratory studies, ordering and review of radiographic studies, pulse oximetry and re-evaluation of patient's condition.   Medications Ordered in ED Medications  nitroGLYCERIN (NITROGLYN) 2 % ointment 1 inch (not administered)  nitroGLYCERIN (NITROSTAT) SL tablet 0.4 mg (0.4 mg Sublingual Given 06/04/17 0953)  LORazepam (ATIVAN) injection 1 mg (not administered)  nitroGLYCERIN 50 mg in dextrose 5 % 250 mL (0.2 mg/mL) infusion (45 mcg/min Intravenous Rate/Dose Change 06/04/17 1050)     Initial Impression / Assessment and Plan / ED Course  I have reviewed the triage vital signs and the nursing notes.  Pertinent labs & imaging results that were available during my care of the patient were reviewed by me and considered in my medical decision making (see chart for details).  Clinical Course as of Jun 04 1310  Sat Jun 04, 2017  1028 Initially declining BiPap, however now agrees. Placed on BiPAP.  [AL]  39 [name] from nephrology will see the patient and dialyze  [AL]    Clinical Course User Index [AL] Frederica Kuster, PA-C    Patient with acute respiratory distress, most likely flash pulmonary edema.  Patient placed on BiPAP in the ED with good improvement of symptoms.  Patient needing emergency dialysis.  I spoke with nephrologist, [name], who will evaluate the patient and take to dialysis.  Patient's with bicarb on VBG of 28.1.  Potassium is 3.4, anion gap 19.  Initial lactic 1.34.  Troponin 0.05. BNP >4500. CXR shows moderate diffuse symmetric bilateral airspace disease highly suspicious for diffuse pulmonary edema, small right pleural effusion also noted.  Patient also presenting with significant hypertension,  initially 219/126 and was started on nitroglycerin drip.  EKG shows second degree AV block, Mobitz type II which is seen on previous tracing, but tachycardia is new.  I consulted [facility] and spoke with Dr. Lonny Prude who will admit the patient to the stepdown unit.  Patient also evaluated by Dr. Rex Kras who guided the patient's management and agrees with plan.  Final Clinical Impressions(s) / ED Diagnoses   Final diagnoses:  Respiratory distress    New Prescriptions New Prescriptions   No medications on file     Frederica Kuster, Hershal Coria 06/04/17 Keyser, Wenda Overland, MD 06/05/17 [phone]

## 2017-06-04 NOTE — ED Triage Notes (Signed)
Pt to ER via [facility] from skilled nursing facility with complaint of sudden onset respiratory distress at 3 am, significant hypertension noted, rales throughout. MD Little at bedside. Pt not allowing NRB, sats 88% on nasal cannula 6L. Pt is a/o x4. Dialysis access present to left arm, no IV access.

## 2017-06-04 NOTE — Progress Notes (Signed)
Pt w/ Increased WOB and pt c/o SOB, sat 84-90% on [place], pt refusing NRB and refusing bipap.  Explained reasons for NRB and bipap, but pt still refusing currently.   MD at bedside and aware.

## 2017-06-04 NOTE — Assessment & Plan Note (Deleted)
Continue Lantus 40 units, sliding scale insulin

## 2017-06-04 NOTE — Progress Notes (Signed)
Dr. Lonny Prude, S., paged to inquire if repeat lactic acid needed due to elevated level this AM. Call back received and MD to place order for lactic acid level now. Will continue to monitor.

## 2017-06-04 NOTE — Consult Note (Signed)
[facility] Renal Consultation Note  Indication for Consultation:  Management of ESRD/hemodialysis; anemia, hypertension/volume and secondary hyperparathyroidism  HPI: [name] Sr. is a 41 y.o. 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 Edema placed on Bipap.K 3.4 hgb 12.1. He attended shortened HD yest 3 hr 11 min (4 hr tx) [place] arriving below edw and edw lowered 2 kg.Also wed only 3hr 50min tx  Currently in ER appears comfortable on Bipap will UF on HD.      Past Medical History:  Diagnosis Date  . Anemia   . Atherosclerosis of lower extremity ([place])   . CAD (coronary artery disease)   . Chronic combined systolic and diastolic heart failure ([place])   . Complication of anesthesia   . ESRD (end stage renal disease) on dialysis [facility])    "MWF [facility]" (03/08/2017)  . GERD (gastroesophageal reflux disease)   . Heart murmur   . History of blood transfusion    "related to OR"  . Hypertension   . PONV (postoperative nausea and vomiting)   . Type II diabetes mellitus ([place])     Past Surgical History:  Procedure Laterality Date  . ABDOMINAL AORTOGRAM N/A 11/02/2016   Procedure: Abdominal Aortogram;  Surgeon: Waynetta Sandy, MD;  Location: [facility];  Service: Cardiovascular;  Laterality: N/A;  . AMPUTATION Left 09/27/2013   Procedure: LEFT GREAT TOE AMPUTATION;  Surgeon: [name], MD;  Location: [place];  Service: Orthopedics;  Laterality: Left;  . AMPUTATION Right 08/15/2015   Procedure: Right Great Toe Amputation;  Surgeon: [name], MD;  Location: [place];  Service: Orthopedics;  Laterality: Right;  . AMPUTATION Left 11/05/2016    Procedure: TRANSMETATARSAL AMPUTATION LEFT FOOT;  Surgeon: [name], MD;  Location: [place];  Service: Orthopedics;  Laterality: Left;  . AMPUTATION Left 03/11/2017   Procedure: LEFT BELOW KNEE AMPUTATION;  Surgeon: [name], MD;  Location: [place];  Service: Orthopedics;  Laterality: Left;  . AMPUTATION Right 03/11/2017   Procedure: RIGHT 2ND TOE AMPUTATION;  Surgeon: [name], MD;  Location: [place];  Service: Orthopedics;  Laterality: Right;  . AV FISTULA PLACEMENT  left arm  . LEFT HEART CATHETERIZATION WITH CORONARY ANGIOGRAM N/A 09/13/2014   Procedure: LEFT HEART CATHETERIZATION WITH CORONARY ANGIOGRAM;  Surgeon: Sinclair Grooms, MD;  Location: [facility] CATH LAB;  Service: Cardiovascular;  Laterality: N/A;  . LOWER EXTREMITY ANGIOGRAPHY Bilateral 11/02/2016   Procedure: Lower Extremity Angiography;  Surgeon: Waynetta Sandy, MD;  Location: [place] [facility];  Service: Cardiovascular;  Laterality: Bilateral;  . PERIPHERAL VASCULAR ATHERECTOMY Left 11/02/2016   Procedure: Peripheral Vascular Atherectomy;  Surgeon: Waynetta Sandy, MD;  Location: [place] [facility];  Service: Cardiovascular;  Laterality: Left;  PERONEAL  . STUMP REVISION Left 01/11/2017   Procedure: Revision Left Transmetatarsal Amputation;  Surgeon: [name], MD;  Location: [place];  Service: Orthopedics;  Laterality: Left;      Family History  Problem Relation Age of Onset  . Heart failure Mother   . Hypertension Mother       reports that he has been smoking Cigarettes.  He has  a 13.00 pack-year smoking history. He has never used smokeless tobacco. He reports that he drinks alcohol. He reports that he uses drugs, including Marijuana.   Allergies  Allergen Reactions  . Coconut Oil Anaphylaxis    Can use topically, allergic to coconut foods     Prior to Admission medications   Medication Sig Start Date End Date Taking? Authorizing Provider  acetaminophen (TYLENOL) 500 MG tablet Take  1,000 mg by mouth every 6 (six) hours as needed for mild pain.    [provider]  Amino Acids-Protein Hydrolys (FEEDING SUPPLEMENT, PRO-STAT SUGAR FREE 64,) LIQD Take 30 mLs by mouth 2 (two) times daily. 03/17/17   Rai, Ripudeep K, MD  amLODipine (NORVASC) 10 MG tablet Take 1 tablet (10 mg total) by mouth at bedtime. 08/16/15   [name], MD  b complex-vitamin c-folic acid (NEPHRO-VITE) 0.8 MG TABS tablet Take 1 tablet by mouth at bedtime.    [provider]  calcitRIOL (ROCALTROL) 0.25 MCG capsule Take 9 capsules (2.25 mcg total) by mouth every Monday, Wednesday, and Friday with hemodialysis. Patient taking differently: Take 2.25 mcg by mouth every Monday, Wednesday, and Friday with hemodialysis. Takes 5 mg with each meal 03/18/17   Rai, Ripudeep K, MD  calcium acetate (PHOSLO) 667 MG capsule Take 3 capsules (2,001 mg total) by mouth 3 (three) times daily with meals. Patient taking differently: Take 1,334 mg by mouth 3 (three) times daily with meals.  03/17/17   Rai, Vernelle Emerald, MD  carvedilol (COREG) 6.25 MG tablet Take 1 tablet (6.25 mg total) by mouth 2 (two) times daily with a meal. Patient not taking: Reported on 04/29/2017 03/17/17   Mendel Corning, MD  clopidogrel (PLAVIX) 75 MG tablet Take 1 tablet (75 mg total) by mouth daily. 11/02/16   Waynetta Sandy, MD  docusate sodium (COLACE) 100 MG capsule Take 1 capsule (100 mg total) by mouth 2 (two) times daily. 03/17/17   Rai, Ripudeep K, MD  fentaNYL (DURAGESIC - DOSED MCG/HR) 12 MCG/HR Place 1 patch (12.5 mcg total) onto the skin every 3 (three) days. 03/18/17   Rai, Ripudeep Raliegh Ip, MD  glucagon (GLUCAGON EMERGENCY) 1 MG injection Inject 1 mg into the vein once as needed (low blood sugar and inability to eat or drink sugar). 08/16/15   [name], MD  insulin aspart (NOVOLOG) 100 UNIT/ML injection Sliding scale CBG 70 - 120: 0 units CBG 121 - 150: 1 unit,  CBG 151 - 200: 2 units,  CBG 201 - 250: 3 units,  CBG 251 - 300:  5 units,  CBG 301 - 350: 7 units,  CBG 351 - 400: 9 units   CBG > 400: 9 units and notify your MD 03/17/17   Rai, Vernelle Emerald, MD  insulin glargine (LANTUS) 100 UNIT/ML injection Inject 0.07 mLs (7 Units total) into the skin at bedtime. Patient taking differently: Inject 4 Units into the skin at bedtime.  03/17/17   Rai, Ripudeep K, MD  ipratropium-albuterol (DUONEB) 0.5-2.5 (3) MG/3ML SOLN Take 3 mLs by nebulization every 6 (six) hours as needed (Shortness of breath).    [provider]  lamoTRIgine (LAMICTAL) 100 MG tablet Take 50 mg by mouth daily.    [provider]  lanthanum (FOSRENOL) 1000 MG chewable tablet Chew 2 tablets (2,000 mg total) by mouth 3 (three) times daily with meals. Patient taking differently: Chew 1,000 mg by mouth 3 (three) times daily with meals.  08/16/15   [name], MD  levofloxacin [facility])  500 MG tablet Take 500 mg by mouth every other day. For 7 days starting 09/08 at 5pm    [provider]  methocarbamol (ROBAXIN) 500 MG tablet Take 1 tablet (500 mg total) by mouth every 6 (six) hours as needed for muscle spasms. 03/17/17   Rai, Vernelle Emerald, MD  multivitamin (RENA-VIT) TABS tablet Take 1 tablet by mouth at bedtime. Patient not taking: Reported on 04/29/2017 03/17/17   Rai, Vernelle Emerald, MD  nicotine (NICODERM CQ - DOSED IN MG/24 HOURS) 21 mg/24hr patch Place 1 patch (21 mg total) onto the skin daily. 03/17/17   Rai, Ripudeep K, MD  ondansetron (ZOFRAN) 4 MG tablet Take 4 mg by mouth every 8 (eight) hours as needed for nausea or vomiting.    [provider]  oxyCODONE (OXY IR/ROXICODONE) 5 MG immediate release tablet Take 1-2 tablets (5-10 mg total) by mouth every 4 (four) hours as needed for breakthrough pain. 03/17/17   Rai, Vernelle Emerald, MD  polyethylene glycol (MIRALAX / GLYCOLAX) packet Take 17 g by mouth daily. 03/17/17   Mendel Corning, MD     Anti-infectives    None      Results for orders placed or performed during the hospital  encounter of 06/04/17 (from the past 48 hour(s))  I-Stat venous blood gas, ED     Status: Abnormal   Collection Time: 06/04/17 10:20 AM  Result Value Ref Range   pH, Ven 7.425 7.250 - 7.430   pCO2, Ven 42.9 (L) 44.0 - 60.0 mmHg   pO2, Ven 49.0 (H) 32.0 - 45.0 mmHg   Bicarbonate 28.1 (H) 20.0 - 28.0 mmol/L   TCO2 29 22 - 32 mmol/L   O2 Saturation 85.0 %   Acid-Base Excess 3.0 (H) 0.0 - 2.0 mmol/L   Patient temperature HIDE    Sample type VENOUS   I-Stat CG4 Lactic Acid, ED     Status: Abnormal   Collection Time: 06/04/17 10:21 AM  Result Value Ref Range   Lactic Acid, Venous 4.34 (HH) 0.5 - 1.9 mmol/L   Comment NOTIFIED PHYSICIAN    ROS: Only positives as in HPI and relays some decreased appetite at [facility]   Physical Exam: Vitals:   06/04/17 1020 06/04/17 1023  BP:  (!) 196/125  Pulse: (!) 129 (!) 126  Resp: (!) 30 (!) 25  Temp:    SpO2: 100% 100%     General:  Alert AAm On BIPAP NAD currently / Appears comfortable, OX3 HEENT:  , Bipap mask on   Neck: supp;e /pos jvd Heart: Reg Tachy 105 , no rub or mur Lungs: Scattered rales, on bipap  Abdomen: bS pos. Soft NT,ND Extremities: L BKA  Stump no edema , healed, R Ft Bandage dry /clean not removed with R Grt And 2nd toes amputated  Skin: warm dry  No overt rash Neuro: Alert, OX3 moves all extrem  To requests Dialysis Access: LUA AVF pos Bruit   Dialysis Orders: Center:  [facility] MonWedFri, 4 hrs 0 min, Optiflux 200NRe,  EDW 74.5 (kg), Just lowered 2 kg   Dialysate 2.0 K, 2.0 Ca, 2400 uHeparin  LUA AVF    Calcitriol 1.2mcg / Venofer 100 mg  Next 2 TXs  22nd, 24th   Assessment/Plan 1. Pulmonary Edema /volume overload=( wt loss needing edw lowered ) attempt 3.5 - 4 l uf today and lower edw further fu uwts /bp 2. ESRD -  Nl Schedule MWF (HD today 2/2 Vol ^0 K 3.4  Use 4k bath  3. Hypertension/volume  - BP  elavated on admit , will uf volume/ contiued BPmeds  Amlodipine , Coreg 4. Anemia  - hgb 12 no esa need, fe  On hd  mon/wed 5. Metabolic bone disease -   Po calcitriol  M/w hd tx days / phoslo binder 6. PVD  SP L BKA, R Grt toe 2nd toe amp- 7. DM type 1 - per admit   Ernest Haber, PA-C [place] [phone] 06/04/2017, 10:28 AM   Pt seen, examined and agree w A/P as above. ESRD patient with pulm edema probably due to lean body wt loss.  Has not been missing HD lately.  Plan extra HD this afternoon, max UF.   Kelly Splinter MD [employer] pager [phone]   06/04/2017, 3:31 PM

## 2017-06-04 NOTE — Progress Notes (Signed)
Arrived to unit from dialysis in stable condition. Oriented to room and unit. Irritable upon arrival not wanting blood sugar or blood pressure taken at this time. Orders reviewed. Will continue to monitor.

## 2017-06-04 NOTE — Progress Notes (Signed)
Late entry-  Pt transported to dialysis via vent/bipap w/ no apparent complications.  RT covering dialysis aware/report given.  No distress noted once pt in dialysis and VSS at the time transport was completed.

## 2017-06-04 NOTE — Progress Notes (Signed)
Pt received ativan, now pt ready to try bipap.  Pt tol bipap well and immediately noted decreased WOB once on bipap.  Pt states his breathing is better on bipap currently.  RN in room and aware.  VSS currently.

## 2017-06-04 NOTE — H&P (Addendum)
History and Physical  [name] DOB: [birth year] DOA: 06/04/2017  Referring physician: Teodoro Spray, PA-C PCP: Patient, [name] Per  Outpatient Specialists: Orthopedics, Cardiology, Vascular Surgery, Podiatry Patient coming from: Nursing & is able not able to ambulate.  Chief Complaint: shortness of breath  HPI: [name] Poupard Sr. is a 41 y.o. male with medical history significant for ESRD on dialysis MWF, chronic combined systolic and diastolic CHF,PVD s/p left metatarsal amputation, hypertension, type 2 diabetes who presented to [facility] for shortness of breath.  Patient states he woke up this morning at 3 AM acutely short of breath at his facility.  He tried using his oxygen but that did not improve his symptoms. Denies any sick contacts.  Prior to this, he was in his usual state of health.  He was able to obtain his last HD session with no complications the day prior.  He states he was 1.5 kilograms under his dry weight prior to that HD session and  500 cc were removed at that time.  Patient reports being adherent with all of his medications at his facility.  Patient also reports cough productive of white phlegm.  He denies any fevers, chills, nausea, vomiting.  Of note, patient was last admitted: From 7/23-03/17/2017 for osteomyelitis of the right foot with gangrenous changes requiring amputation in the setting of peripheral vascular disease.  ED Course: Patient was afebrile tachypneic 30s, tachycardic to the 120s,High blood pressure 207/106.  4.34, VBG showed normal pH, CO2 of 49, bicarb 28.BMP significant for glucose 209, BUN 25, creatinine 6.6(ESRD).  White count normal, hemoglobin 12.1.  BNP > 4500.  Chest x-ray from mild cardiomegaly and diffuse bilateral airspace disease likely diffuse pulmonary edema with small right pleural effusion.  He was started on nitro drip and given bipap then sent to get HD. After completing his HD, I evaluated patient who was  doing much better. He had been transitioned to 3 L [place] O2 and BP had improved to 190/92 without nitro gtt.   Review of Systems: Patient seen during dialysis. Pt complains of shortness of breath, cough productive of white phlegm  Pt denies any nausea, vomiting,abdominal pain, diarrhea, constipation, dysuria.  Review of systems are otherwise negative   Past Medical History:  Diagnosis Date  . Anemia   . Atherosclerosis of lower extremity ([place])   . CAD (coronary artery disease)   . Chronic combined systolic and diastolic heart failure ([place])   . Complication of anesthesia   . ESRD (end stage renal disease) on dialysis [facility])    "MWF [employer]" (03/08/2017)  . GERD (gastroesophageal reflux disease)   . Heart murmur   . History of blood transfusion    "related to OR"  . Hypertension   . PONV (postoperative nausea and vomiting)   . Type II diabetes mellitus ([place])    Past Surgical History:  Procedure Laterality Date  . ABDOMINAL AORTOGRAM N/A 11/02/2016   Procedure: Abdominal Aortogram;  Surgeon: Waynetta Sandy, MD;  Location: [facility] [facility];  Service: Cardiovascular;  Laterality: N/A;  . AMPUTATION Left 09/27/2013   Procedure: LEFT GREAT TOE AMPUTATION;  Surgeon: [name], MD;  Location: [place];  Service: Orthopedics;  Laterality: Left;  . AMPUTATION Right 08/15/2015   Procedure: Right Great Toe Amputation;  Surgeon: [name], MD;  Location: [place];  Service: Orthopedics;  Laterality: Right;  . AMPUTATION Left 11/05/2016   Procedure: TRANSMETATARSAL AMPUTATION LEFT FOOT;  Surgeon: [name],  MD;  Location: [place];  Service: Orthopedics;  Laterality: Left;  . AMPUTATION Left 03/11/2017   Procedure: LEFT BELOW KNEE AMPUTATION;  Surgeon: [name], MD;  Location: [place];  Service: Orthopedics;  Laterality: Left;  . AMPUTATION Right 03/11/2017   Procedure: RIGHT 2ND TOE AMPUTATION;  Surgeon: [name], MD;  Location: [place];  Service: Orthopedics;  Laterality:  Right;  . AV FISTULA PLACEMENT  left arm  . LEFT HEART CATHETERIZATION WITH CORONARY ANGIOGRAM N/A 09/13/2014   Procedure: LEFT HEART CATHETERIZATION WITH CORONARY ANGIOGRAM;  Surgeon: Sinclair Grooms, MD;  Location: [facility] CATH LAB;  Service: Cardiovascular;  Laterality: N/A;  . LOWER EXTREMITY ANGIOGRAPHY Bilateral 11/02/2016   Procedure: Lower Extremity Angiography;  Surgeon: Waynetta Sandy, MD;  Location: [place] [facility];  Service: Cardiovascular;  Laterality: Bilateral;  . PERIPHERAL VASCULAR ATHERECTOMY Left 11/02/2016   Procedure: Peripheral Vascular Atherectomy;  Surgeon: Waynetta Sandy, MD;  Location: [place] [facility];  Service: Cardiovascular;  Laterality: Left;  PERONEAL  . STUMP REVISION Left 01/11/2017   Procedure: Revision Left Transmetatarsal Amputation;  Surgeon: [name], MD;  Location: [place];  Service: Orthopedics;  Laterality: Left;    Social History:  reports that he has been smoking Cigarettes.  He has a 13.00 pack-year smoking history. He has never used smokeless tobacco. He reports that he drinks alcohol. He reports that he uses drugs, including Marijuana.   Allergies  Allergen Reactions  . Coconut Oil Anaphylaxis    Can use topically, allergic to coconut foods     Family History  Problem Relation Age of Onset  . Heart failure Mother   . Hypertension Mother       Prior to Admission medications   Medication Sig Start Date End Date Taking? Authorizing Provider  acetaminophen (TYLENOL) 500 MG tablet Take 1,000 mg by mouth every 6 (six) hours as needed for mild pain.   Yes [provider]  Amino Acids-Protein Hydrolys (FEEDING SUPPLEMENT, PRO-STAT SUGAR FREE 64,) LIQD Take 30 mLs by mouth 2 (two) times daily. 03/17/17  Yes Rai, Ripudeep K, MD  amLODipine (NORVASC) 10 MG tablet Take 1 tablet (10 mg total) by mouth at bedtime. 08/16/15  Yes Short, Noah Delaine, MD  b complex-vitamin c-folic acid (NEPHRO-VITE) 0.8 MG TABS tablet Take 1  tablet by mouth at bedtime.   Yes [provider]  calcitRIOL (ROCALTROL) 0.25 MCG capsule Take 9 capsules (2.25 mcg total) by mouth every Monday, Wednesday, and Friday with hemodialysis. Patient taking differently: Take 2.25 mcg by mouth See admin instructions. Take 9 capsules (2.25 mcg totally) by mouth every Mon - Wed - Fri. 03/18/17  Yes Rai, Ripudeep K, MD  calcium acetate (PHOSLO) 667 MG capsule Take 3 capsules (2,001 mg total) by mouth 3 (three) times daily with meals. Patient taking differently: Take 1,334 mg by mouth 3 (three) times daily with meals.  03/17/17  Yes Rai, Ripudeep K, MD  carvedilol (COREG) 6.25 MG tablet Take 1 tablet (6.25 mg total) by mouth 2 (two) times daily with a meal. 03/17/17  Yes Rai, Ripudeep K, MD  clopidogrel (PLAVIX) 75 MG tablet Take 1 tablet (75 mg total) by mouth daily. 11/02/16  Yes Waynetta Sandy, MD  docusate sodium (COLACE) 100 MG capsule Take 1 capsule (100 mg total) by mouth 2 (two) times daily. 03/17/17  Yes Rai, Ripudeep K, MD  fentaNYL (DURAGESIC - DOSED MCG/HR) 12 MCG/HR Place 1 patch (12.5 mcg total) onto the skin every  3 (three) days. 03/18/17  Yes Rai, Ripudeep K, MD  furosemide (LASIX) 20 MG tablet Take 20 mg by mouth 2 (two) times daily.   Yes [provider]  glucagon (GLUCAGON EMERGENCY) 1 MG injection Inject 1 mg into the vein once as needed (low blood sugar and inability to eat or drink sugar). 08/16/15  Yes Short, Noah Delaine, MD  insulin glargine (LANTUS) 100 UNIT/ML injection Inject 0.07 mLs (7 Units total) into the skin at bedtime. Patient taking differently: Inject 4 Units into the skin at bedtime.  03/17/17  Yes Rai, Ripudeep K, MD  ipratropium-albuterol (DUONEB) 0.5-2.5 (3) MG/3ML SOLN Take 3 mLs by nebulization every 6 (six) hours as needed (Shortness of breath).   Yes [provider]  LamoTRIgine 50 MG TBDP Take 125 mg by mouth at bedtime.    Yes [provider]  lanthanum (FOSRENOL) 1000 MG chewable  tablet Chew 2 tablets (2,000 mg total) by mouth 3 (three) times daily with meals. 08/16/15  Yes Short, Noah Delaine, MD  nicotine (NICODERM CQ - DOSED IN MG/24 HOURS) 21 mg/24hr patch Place 1 patch (21 mg total) onto the skin daily. 03/17/17  Yes Rai, Ripudeep K, MD  ondansetron (ZOFRAN) 4 MG tablet Take 4 mg by mouth every 8 (eight) hours as needed for nausea or vomiting.   Yes [provider]  oxyCODONE (OXY IR/ROXICODONE) 5 MG immediate release tablet Take 1-2 tablets (5-10 mg total) by mouth every 4 (four) hours as needed for breakthrough pain. Patient taking differently: Take 5-10 mg by mouth every 4 (four) hours as needed for moderate pain.  03/17/17  Yes Rai, Ripudeep K, MD  polyethylene glycol (MIRALAX / GLYCOLAX) packet Take 17 g by mouth daily. 03/17/17  Yes Rai, Ripudeep K, MD  insulin aspart (NOVOLOG) 100 UNIT/ML injection Sliding scale CBG 70 - 120: 0 units CBG 121 - 150: 1 unit,  CBG 151 - 200: 2 units,  CBG 201 - 250: 3 units,  CBG 251 - 300: 5 units,  CBG 301 - 350: 7 units,  CBG 351 - 400: 9 units   CBG > 400: 9 units and notify your MD Patient not taking: Reported on 06/04/2017 03/17/17   Rai, Vernelle Emerald, MD  methocarbamol (ROBAXIN) 500 MG tablet Take 1 tablet (500 mg total) by mouth every 6 (six) hours as needed for muscle spasms. Patient not taking: Reported on 06/04/2017 03/17/17   Mendel Corning, MD  multivitamin (RENA-VIT) TABS tablet Take 1 tablet by mouth at bedtime. Patient not taking: Reported on 04/29/2017 03/17/17   Mendel Corning, MD    Physical Exam: BP (!) 188/80 (BP Location: Right Arm)   Pulse (!) 104   Temp 98.7 F (37.1 C) (Oral)   Resp (!) 21   SpO2 96%   General: Lying in bed, in no apparent distress, able to converse with me Eyes: Anicteric sclera, extraocular movement intact ENT: Moist oral mucosa Cardiovascular: Regular rate and rhythm, no murmurs rubs or gallops appreciated Respiratory: Initial exam difficult to appreciate breath sounds over the BiPAP.   Repeat lung exam after completion of BiPAP, patient with no accessory muscle use, on 2 L nasal cannula O2, no wheezing, slight crackles at bases Abdomen: Soft, nontender, nondistended, normoactive bowel sounds Skin: Dry, intact Musculoskeletal: No bruises,Left AKA, status post amputated right great toe Psychiatric: Normal affect and mood Neurologic: Alert and oriented x4          Labs on Admission:  Basic Metabolic Panel:  Recent Labs Lab  06/04/17 1000  NA 137  K 3.4*  CL 94*  CO2 24  GLUCOSE 209*  BUN 25*  CREATININE 6.16*  CALCIUM 9.6   Liver Function Tests: No results for input(s): AST, ALT, ALKPHOS, BILITOT, PROT, ALBUMIN in the last 168 hours. No results for input(s): LIPASE, AMYLASE in the last 168 hours. No results for input(s): AMMONIA in the last 168 hours. CBC:  Recent Labs Lab 06/04/17 1000  WBC 9.3  NEUTROABS 7.0  HGB 12.1*  HCT 37.0*  MCV 82.2  PLT 271   Cardiac Enzymes: No results for input(s): CKTOTAL, CKMB, CKMBINDEX, TROPONINI in the last 168 hours.  BNP (last 3 results)  Recent Labs  06/04/17 1000  BNP >4,500.0*    ProBNP (last 3 results) No results for input(s): PROBNP in the last 8760 hours.  CBG: No results for input(s): GLUCAP in the last 168 hours.  Radiological Exams on Admission: Dg Chest 1 View  Result Date: 06/04/2017 CLINICAL DATA:  Acute respiratory distress. Severe shortness of breath. EXAM: CHEST 1 VIEW COMPARISON:  05/31/2015 FINDINGS: Mild cardiomegaly. Moderate diffuse symmetric bilateral airspace disease is seen, highly suspicious for diffuse pulmonary edema. Small right pleural effusion also noted. IMPRESSION: Findings consistent with moderate congestive heart failure. Electronically Signed   By: Earle Gell M.D.   On: 06/04/2017 10:16    EKG: Independently reviewed. Limited by artifact. Sinus tachycardia with no appreciable ischemic change  Assessment/Plan Present on Admission: . Flash pulmonary edema ([place]) .  Hypertensive emergency . Essential hypertension  Active Problems:   ESRD on dialysis ([place])   Diabetes ([place])   Essential hypertension   Flash pulmonary edema (HCC)   Hypertensive emergency   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 stage 5 chronic kidney disease on chronic dialysis ([place])  Acute hypoxic failure secondary to exacerbation of chronic systolic/diastolic heart failure Flash pulmonary edema Hypertensive emergency Significantly elevated blood pressure likely leading to flash pulmonary edema and exacerbation of combined systolic/heart failure.  Unclear etiology given no missed HD sessions and patient reports adherence to BP meds at SNF -s/p BiPAP in ED. Tolerated transition to 3 L [place] O2 -s/p Nitro drip for blood pressure control in ED BP stabilized off nitro gtt to 190/90 during HD and maintained afterward -Reinitiate oral blood pressure medication: amlodipine, carvedilol, lasix; PRN hydralazine - HD, nephrology following  Type 2 diabetes Continue Lantus 4 units, sliding scale insulin  Anemia of chronic disease  Baseline hemoglobin 8-10.  Admission hemoglobin 12 likely hemoconcentrated.  We will continue to monitor -Type and screen, monitoring CBC      DVT prophylaxis: Heparin  Code Status: Full code   Family Communication: Family at bedside. plan on communicating with family tomorrow morning  Disposition Plan: Control blood pressure, once euvolemic  Consults called: Nephrology  Admission status: Inpatient    Desiree Hane MD [facility]  Pager [phone]  If 7PM-7AM, please contact night-coverage [email or web address] Password [email or web address]  06/04/2017, 7:31 PM

## 2017-06-04 NOTE — ED Notes (Signed)
Pt refusing NRB mask; MD, RN aware

## 2017-06-05 ENCOUNTER — Encounter (HOSPITAL_COMMUNITY): Payer: Self-pay | Admitting: General Practice

## 2017-06-05 DIAGNOSIS — R0603 Acute respiratory distress: Secondary | ICD-10-CM | POA: Diagnosis not present

## 2017-06-05 DIAGNOSIS — I161 Hypertensive emergency: Secondary | ICD-10-CM | POA: Diagnosis not present

## 2017-06-05 DIAGNOSIS — N186 End stage renal disease: Secondary | ICD-10-CM | POA: Diagnosis not present

## 2017-06-05 DIAGNOSIS — J810 Acute pulmonary edema: Secondary | ICD-10-CM | POA: Diagnosis not present

## 2017-06-05 LAB — CBC
HCT: 33.1 % — ABNORMAL LOW (ref 39.0–52.0)
Hemoglobin: 10.6 g/dL — ABNORMAL LOW (ref 13.0–17.0)
MCH: 26.3 pg (ref 26.0–34.0)
MCHC: 32 g/dL (ref 30.0–36.0)
MCV: 82.1 fL (ref 78.0–100.0)
PLATELETS: 195 10*3/uL (ref 150–400)
RBC: 4.03 MIL/uL — AB (ref 4.22–5.81)
RDW: 20.7 % — ABNORMAL HIGH (ref 11.5–15.5)
WBC: 8.5 10*3/uL (ref 4.0–10.5)

## 2017-06-05 LAB — BASIC METABOLIC PANEL
Anion gap: 11 (ref 5–15)
BUN: 25 mg/dL — ABNORMAL HIGH (ref 6–20)
CALCIUM: 9.2 mg/dL (ref 8.9–10.3)
CO2: 26 mmol/L (ref 22–32)
CREATININE: 5.7 mg/dL — AB (ref 0.61–1.24)
Chloride: 97 mmol/L — ABNORMAL LOW (ref 101–111)
GFR, EST AFRICAN AMERICAN: 13 mL/min — AB (ref >60)
GFR, EST NON AFRICAN AMERICAN: 11 mL/min — AB (ref >60)
Glucose, Bld: 125 mg/dL — ABNORMAL HIGH (ref 65–99)
Potassium: 4.1 mmol/L (ref 3.5–5.1)
SODIUM: 134 mmol/L — AB (ref 135–145)

## 2017-06-05 NOTE — Clinical Social Work Note (Signed)
Clinical Social Work Assessment  Patient Details  Name: [name] Gladu Sr. MRN: [id number] Date of Birth: 05/15/1976  Date of referral:  06/05/17               Reason for consult:  Facility Placement                Permission sought to share information with:  Chartered certified accountant granted to share information::  Yes, Verbal Permission Granted  Name::        Agency::  [facility]  Relationship::     Contact Information:     Housing/Transportation Living arrangements for the past 2 months:  [place] of Information:  Patient Patient Interpreter Needed:  None Criminal Activity/Legal Involvement Pertinent to Current Situation/Hospitalization:  No - Comment as needed Significant Relationships:  None Lives with:  Self, Facility Resident Do you feel safe going back to the place where you live?  Yes Need for family participation in patient care:  No (Coment)  Care giving concerns:  Patient has been at [facility] and has no concerns about care.   Social Worker assessment / plan:  CSW confirmed with MD that patient was medically stable for discharge to facility. CSW confirmed with patient that the plan was to return to [facility]. CSW completed paperwork and coordinated discharge. CSW set up transport.  Employment status:    Insurance information:  Medicaid In [place] PT Recommendations:  Not assessed at this time Information / Referral to community resources:     Patient/Family's Response to care:  Patient agreeable to return to SNF.  Patient/Family's Understanding of and Emotional Response to Diagnosis, Current Treatment, and Prognosis:  Patient was groggy during discussion but indicated appreciation of CSW assistance in coordinating return to SNF.  Emotional Assessment Appearance:  Appears stated age Attitude/Demeanor/Rapport:    Affect (typically observed):    Orientation:  Oriented to Self, Oriented to Place, Oriented to  Time,  Oriented to Situation Alcohol / Substance use:  Not Applicable Psych involvement (Current and /or in the community):  No (Comment)  Discharge Needs  Concerns to be addressed:  Care Coordination Readmission within the last 30 days:  Yes Current discharge risk:  Physical Impairment Barriers to Discharge:  No Barriers Identified   Geralynn Ochs, LCSW 06/05/2017, 3:34 PM

## 2017-06-05 NOTE — NC FL2 (Signed)
[place] MEDICAID FL2 LEVEL OF CARE SCREENING TOOL     IDENTIFICATION  Patient Name: [name]. Birthdate: 1976-03-07 Sex: male Admission Date (Current Location): 06/04/2017  [facility] [place] Number:  Herbalist and Address:  [facility] [place] [address] [place], [place] [address]      Provider Number: [id number]  Attending Physician Name and Address:  Desiree Hane, MD  Relative Name and Phone Number:       Current Level of Care: Hospital Recommended Level of Care: [place] Prior Approval Number:    Date Approved/Denied:   PASRR Number: [id number]   Discharge Plan: SNF    Current Diagnoses: Patient Active Problem List   Diagnosis Date Noted  . Flash pulmonary edema ([place]) 06/04/2017  . Hypertensive emergency 06/04/2017  . 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 S/P unilateral BKA (below knee amputation), left ([place])   . Post-operative pain   . Acute blood loss anemia   . Chronic combined systolic and diastolic CHF (congestive heart failure) ([place])   . PVD (peripheral vascular disease) ([place])   . Coronary artery disease involving native coronary artery of native heart without angina pectoris   . Tobacco abuse   . Diabetic foot infection ([place]) 03/08/2017  . Osteomyelitis ([place]) 03/08/2017  . Wound dehiscence   . Diabetes mellitus with complication ([place])   . Dehiscence of amputation stump ([place]) 01/04/2017  . S/P transmetatarsal amputation of foot, left ([place]) 11/16/2016  . Gangrene of right foot ([place]) 08/02/2016  . Achilles tendon contracture, left 08/02/2016  . Anemia of renal disease 08/16/2015  . Wound infection 08/15/2015  . Diabetic ulcer of right great toe ([place]) 08/15/2015  . Pain in the chest   . Elevated troponin   . Essential hypertension    . ESRD on dialysis ([place]) 08/07/2013  . Diabetes ([place]) 08/07/2013  . Chest pain 05/16/2012  . Murmur 05/16/2012  . Renal disorder     Orientation RESPIRATION BLADDER Height & Weight     Self, Time, Situation, Place  O2 ([place] 4L) Continent Weight: 164 lb 10.9 oz (74.7 kg) Height:     BEHAVIORAL SYMPTOMS/MOOD NEUROLOGICAL BOWEL NUTRITION STATUS      Continent Diet (low sodium)  AMBULATORY STATUS COMMUNICATION OF NEEDS Skin   Limited Assist Verbally Normal                       Personal Care Assistance Level of Assistance  Bathing, Dressing Bathing Assistance: Limited assistance   Dressing Assistance: Limited assistance     Functional Limitations Info             SPECIAL CARE FACTORS FREQUENCY  PT (By licensed PT), OT (By licensed OT)     PT Frequency: 5x/wk OT Frequency: 5x/wk            Contractures      Additional Factors Info  Code Status, Allergies, Psychotropic, Isolation Precautions, Insulin Sliding Scale Code Status Info: Full Allergies Info: Coconut oil Psychotropic Info: Lamictal 125mg  daily Insulin Sliding Scale Info: Lantus 4 units daily at bedtime Isolation Precautions Info: Contact precautions; MRSA     Current Medications (06/05/2017):  This is the current hospital active medication list Current Facility-Administered Medications  Medication Dose Route Frequency Provider Last Rate Last Dose  . acetaminophen (TYLENOL) tablet 1,000  mg  1,000 mg Oral Q6H PRN Oretha Milch D, MD      . amLODipine (NORVASC) tablet 10 mg  10 mg Oral QHS Oretha Milch D, MD   10 mg at 06/04/17 2231  . [START ON 06/06/2017] calcitRIOL (ROCALTROL) capsule 2.25 mcg  2.25 mcg Oral Q M,W,F-HD Oretha Milch D, MD      . carvedilol (COREG) tablet 6.25 mg  6.25 mg Oral BID WC Oretha Milch D, MD   6.25 mg at 06/05/17 0826  . clopidogrel (PLAVIX) tablet 75 mg  75 mg Oral Daily Oretha Milch D, MD   75 mg at 06/05/17 0934  . docusate sodium (COLACE) capsule 100 mg  100  mg Oral BID Oretha Milch D, MD   100 mg at 06/05/17 0935  . feeding supplement (PRO-STAT SUGAR FREE 64) liquid 30 mL  30 mL Oral BID Oretha Milch D, MD   30 mL at 06/04/17 2227  . furosemide (LASIX) tablet 20 mg  20 mg Oral BID Oretha Milch D, MD   20 mg at 06/05/17 0826  . heparin injection 5,000 Units  5,000 Units Subcutaneous Q8H Oretha Milch D, MD   5,000 Units at 06/04/17 2231  . hydrALAZINE (APRESOLINE) injection 10 mg  10 mg Intravenous Q6H PRN Oretha Milch D, MD   10 mg at 06/05/17 0936  . insulin glargine (LANTUS) injection 4 Units  4 Units Subcutaneous QHS Oretha Milch D, MD      . lamoTRIgine (LAMICTAL) tablet 125 mg  125 mg Oral QHS Oretha Milch D, MD   125 mg at 06/04/17 2228  . lanthanum (FOSRENOL) chewable tablet 2,000 mg  2,000 mg Oral TID WC Oretha Milch D, MD   2,000 mg at 06/05/17 0826  . LORazepam (ATIVAN) injection 1 mg  1 mg Intravenous Once Law, Alexandra M, PA-C      . methocarbamol (ROBAXIN) tablet 500 mg  500 mg Oral Q6H PRN Oretha Milch D, MD      . multivitamin (RENA-VIT) tablet 1 tablet  1 tablet Oral QHS Oretha Milch D, MD   1 tablet at 06/04/17 2229  . nitroGLYCERIN (NITROSTAT) SL tablet 0.4 mg  0.4 mg Sublingual Q5 Min x 3 PRN Little, Wenda [place], MD   0.4 mg at 06/04/17 0953  . ondansetron (ZOFRAN) tablet 4 mg  4 mg Oral Q8H PRN Oretha Milch D, MD   4 mg at 06/04/17 1957  . oxyCODONE (Oxy IR/ROXICODONE) immediate release tablet 5-10 mg  5-10 mg Oral Q4H PRN Oretha Milch D, MD      . polyethylene glycol (MIRALAX / GLYCOLAX) packet 17 g  17 g Oral Daily Nettey, Myrlene Broker D, MD      . sodium chloride flush (NS) 0.9 % injection 3 mL  3 mL Intravenous Q12H Oretha Milch D, MD   3 mL at 06/04/17 2225     Discharge Medications: Please see discharge summary for a list of discharge medications.  Relevant Imaging Results:  Relevant Lab Results:   Additional Information SS#: [id number]; Dialysis patient MWF at [facility]  [place]

## 2017-06-05 NOTE — Discharge Summary (Signed)
Discharge Summary  [name] Laviolette Sr. QIH:[id number] DOB: 1976-06-22  PCP: Patient, [name] Per  Admit date: 06/04/2017 Discharge date: 06/05/2017  Time spent: 25 minutes  Recommendations for Outpatient Follow-up:  1. Monitor blood pressure  Discharge Diagnoses:  Active Hospital Problems   Diagnosis Date Noted  . Flash pulmonary edema ([place]) 06/04/2017  . Hypertensive emergency 06/04/2017  . 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 Essential hypertension   . ESRD on dialysis ([place]) 08/07/2013  . Diabetes ([place]) 08/07/2013    Resolved Hospital Problems   Diagnosis Date Noted Date Resolved  No resolved problems to display.    Discharge Condition: Fair  Diet recommendation: Heart healthy diet, renal diet  Vitals:   06/05/17 0901 06/05/17 1030  BP: (!) 162/54 (!) 148/45  Pulse: 87 87  Resp: 20 19  Temp: 98.3 F (36.8 C) 98.5 F (36.9 C)  SpO2: 100% 99%    History of present illness:  [name] is a 41 year old male with medical history of GERD for ESRD on dialysis Monday Wednesday Friday, chronic combined systolic and diastolic CHF, PVD status post left metatarsal amputation, hypertension, type 2 diabetes who [name] on 06/04/2017 with acute shortness of breath.  Patient stated he woke up that morning at 3 AM acutely short of breath.  He is typically on room air but decided to try to use his as needed oxygen but that did not improve his symptoms.  Patient denies any sick contacts.  Patient reports cough productive of white phlegm.  Of note, patient has not missed any of his dialysis sessions.  His last outpatient setting the day prior to admission which she states he was found to be 1.5 kg under his dry weight.  500 cc were removed.  ED Course: Patient was afebrile tachypneic 30s, tachycardic  to the 120s,High blood pressure 207/106.  4.34, VBG showed normal pH, CO2 of 49, bicarb 28.BMP significant for glucose 209, BUN 25, creatinine 6.6(ESRD).  White count normal, hemoglobin 12.1.  BNP > 4500.  Chest x-ray from mild cardiomegaly and diffuse bilateral airspace disease likely diffuse pulmonary edema with small right pleural effusion.  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  BiPAP was started in the ED.  Patient also required nitro drip and initially was deemed an appropriate candidate for stepdown unit.  Patient was taken for his dialysis session, nitro drip was stopped during that and his blood pressure improved.  Patient was able to transition to 3 L nasal cannula for oxygen supplementation during HD.  Since nitro gtt was no longer required patient was admitted to the floor.  Over the next 24 hours patient's return to baseline with resumption of his home blood pressure medications.  Patient required 2 as needed hydralazine doses.  On day of dischargeBlood pressure was 148/45 on home blood pressure medications.  Patient continued to  deny any chest pain.  Patient also reported dyspnea greatly improved.  Patient was on 3-4 L oxygen by nasal cannula on day of discharge (patient reports using similar settings as needed at his facility).   Hypertension As mentioned above patient's home blood pressure medications were resumed.  No changes were made during hospital  stay.  Procedures:  1 HD Session  Consultations:  Nephrology  Discharge Exam: BP (!) 148/45 (BP Location: Right Arm)   Pulse 87   Temp 98.5 F (36.9 C) (Oral)   Resp 19   Wt 74.7 kg (164 lb 10.9 oz)   SpO2 99%   BMI 21.14 kg/m   General: Lying in bed in no distress, resting comfortably, pleasant in conversation Cardiovascular: Regular rate and rhythm, no JVD Respiratory: Lungs clear to auscultation, no rales, no wheezes, no, on 3 L oxygen   Discharge Instructions You were cared for by a hospitalist during your hospital stay. If you have any questions about your discharge medications or the care you received while you were in the hospital after you are discharged, you can call the unit and asked to speak with the hospitalist on call if the hospitalist that took care of you is not available. Once you are discharged, your primary care physician will handle any further medical issues. Please note that NO REFILLS for any discharge medications will be authorized once you are discharged, as it is imperative that you return to your primary care physician (or establish a relationship with a primary care physician if you do not have one) for your aftercare needs so that they can reassess your need for medications and monitor your lab values.  Discharge Instructions    Diet - low sodium heart healthy    Complete by:  As directed    Increase activity slowly    Complete by:  As directed      Allergies as of 06/05/2017      Reactions   Coconut Oil Anaphylaxis   Can use topically, allergic to coconut foods       Medication List    TAKE these medications   acetaminophen 500 MG tablet Commonly known as:  TYLENOL Take 1,000 mg by mouth every 6 (six) hours as needed for mild pain.   amLODipine 10 MG tablet Commonly known as:  NORVASC Take 1 tablet (10 mg total) by mouth at bedtime.   calcitRIOL 0.25 MCG capsule Commonly known as:  ROCALTROL Take 9 capsules (2.25 mcg total) by  mouth every Monday, Wednesday, and Friday with hemodialysis. What changed:  when to take this  additional instructions   calcium acetate 667 MG capsule Commonly known as:  PHOSLO Take 3 capsules (2,001 mg total) by mouth 3 (three) times daily with meals. What changed:  how much to take   carvedilol 6.25 MG tablet Commonly known as:  COREG Take 1 tablet (6.25 mg total) by mouth 2 (two) times daily with a meal.   clopidogrel 75 MG tablet Commonly known as:  PLAVIX Take 1 tablet (75 mg total) by mouth daily.   docusate sodium 100 MG capsule Commonly known as:  COLACE Take 1 capsule (100 mg total) by mouth 2 (two) times daily.   feeding supplement (PRO-STAT SUGAR FREE 64) Liqd Take 30 mLs by mouth 2 (two) times daily.   fentaNYL 12 MCG/HR Commonly known as:  DURAGESIC - dosed mcg/hr Place 1 patch (12.5 mcg total) onto the skin every 3 (three) days.   furosemide 20 MG tablet Commonly known  as:  LASIX Take 20 mg by mouth 2 (two) times daily.   glucagon 1 MG injection Commonly known as:  GLUCAGON EMERGENCY Inject 1 mg into the vein once as needed (low blood sugar and inability to eat or drink sugar).   insulin aspart 100 UNIT/ML injection Commonly known as:  novoLOG Sliding scale CBG 70 - 120: 0 units CBG 121 - 150: 1 unit,  CBG 151 - 200: 2 units,  CBG 201 - 250: 3 units,  CBG 251 - 300: 5 units,  CBG 301 - 350: 7 units,  CBG 351 - 400: 9 units   CBG > 400: 9 units and notify your MD   insulin glargine 100 UNIT/ML injection Commonly known as:  LANTUS Inject 0.07 mLs (7 Units total) into the skin at bedtime. What changed:  how much to take   ipratropium-albuterol 0.5-2.5 (3) MG/3ML Soln Commonly known as:  DUONEB Take 3 mLs by nebulization every 6 (six) hours as needed (Shortness of breath).   LamoTRIgine 50 MG Tbdp Take 125 mg by mouth at bedtime.   lanthanum 1000 MG chewable tablet Commonly known as:  FOSRENOL Chew 2 tablets (2,000 mg total) by mouth 3 (three)  times daily with meals.   methocarbamol 500 MG tablet Commonly known as:  ROBAXIN Take 1 tablet (500 mg total) by mouth every 6 (six) hours as needed for muscle spasms.   b complex-vitamin c-folic acid 0.8 MG Tabs tablet Take 1 tablet by mouth at bedtime.   multivitamin Tabs tablet Take 1 tablet by mouth at bedtime.   nicotine 21 mg/24hr patch Commonly known as:  NICODERM CQ - dosed in mg/24 hours Place 1 patch (21 mg total) onto the skin daily.   oxyCODONE 5 MG immediate release tablet Commonly known as:  Oxy IR/ROXICODONE Take 1-2 tablets (5-10 mg total) by mouth every 4 (four) hours as needed for breakthrough pain. What changed:  reasons to take this   polyethylene glycol packet Commonly known as:  MIRALAX / GLYCOLAX Take 17 g by mouth daily.   ZOFRAN 4 MG tablet Generic drug:  ondansetron Take 4 mg by mouth every 8 (eight) hours as needed for nausea or vomiting.      Allergies  Allergen Reactions  . Coconut Oil Anaphylaxis    Can use topically, allergic to coconut foods    Contact information for after-discharge care    Destination    [facility] .   Specialty:  [place] information: [address] [place] [phone]               The results of significant diagnostics from this hospitalization (including imaging, microbiology, ancillary and laboratory) are listed below for reference.    Significant Diagnostic Studies: Dg Chest 1 View  Result Date: 06/04/2017 CLINICAL DATA:  Acute respiratory distress. Severe shortness of breath. EXAM: CHEST 1 VIEW COMPARISON:  05/31/2015 FINDINGS: Mild cardiomegaly. Moderate diffuse symmetric bilateral airspace disease is seen, highly suspicious for diffuse pulmonary edema. Small right pleural effusion also noted. IMPRESSION: Findings consistent with moderate congestive heart failure. Electronically Signed   By: Earle Gell M.D.   On: 06/04/2017 10:16     Microbiology: No results found for this or any previous visit (from the past 240 hour(s)).   Labs: Basic Metabolic Panel:  Recent Labs Lab 06/04/17 1000 06/05/17 0825  NA 137 134*  K 3.4* 4.1  CL 94* 97*  CO2 24 26  GLUCOSE 209* 125*  BUN 25*  25*  CREATININE 6.16* 5.70*  CALCIUM 9.6 9.2   Liver Function Tests: No results for input(s): AST, ALT, ALKPHOS, BILITOT, PROT, ALBUMIN in the last 168 hours. No results for input(s): LIPASE, AMYLASE in the last 168 hours. No results for input(s): AMMONIA in the last 168 hours. CBC:  Recent Labs Lab 06/04/17 1000 06/05/17 0825  WBC 9.3 8.5  NEUTROABS 7.0  --   HGB 12.1* 10.6*  HCT 37.0* 33.1*  MCV 82.2 82.1  PLT 271 195   Cardiac Enzymes: No results for input(s): CKTOTAL, CKMB, CKMBINDEX, TROPONINI in the last 168 hours. BNP: BNP (last 3 results)  Recent Labs  06/04/17 1000  BNP >4,500.0*    ProBNP (last 3 results) No results for input(s): PROBNP in the last 8760 hours.  CBG:  Recent Labs Lab 06/04/17 2151  GLUCAP 175*       Signed:  Desiree Hane, MD [facility] 06/05/2017, 2:09 PM

## 2017-06-05 NOTE — Progress Notes (Signed)
[facility] Progress Note  Subjective: feeling much better  Vitals:   06/05/17 0424 06/05/17 0901 06/05/17 0926 06/05/17 1030  BP: (!) 142/84 (!) 162/54  (!) 148/45  Pulse: (!) 101 87  87  Resp: 19 20  19   Temp: 97.9 F (36.6 C) 98.3 F (36.8 C)  98.5 F (36.9 C)  TempSrc: Oral Oral  Oral  SpO2: 92% 100%  99%  Weight:   74.7 kg (164 lb 10.9 oz)     Inpatient medications: . amLODipine  10 mg Oral QHS  . [START ON 06/06/2017] calcitRIOL  2.25 mcg Oral Q M,W,F-HD  . carvedilol  6.25 mg Oral BID WC  . clopidogrel  75 mg Oral Daily  . docusate sodium  100 mg Oral BID  . feeding supplement (PRO-STAT SUGAR FREE 64)  30 mL Oral BID  . furosemide  20 mg Oral BID  . heparin  5,000 Units Subcutaneous Q8H  . insulin glargine  4 Units Subcutaneous QHS  . lamoTRIgine  125 mg Oral QHS  . lanthanum  2,000 mg Oral TID WC  . LORazepam  1 mg Intravenous Once  . multivitamin  1 tablet Oral QHS  . polyethylene glycol  17 g Oral Daily  . sodium chloride flush  3 mL Intravenous Q12H    acetaminophen, hydrALAZINE, methocarbamol, nitroGLYCERIN, ondansetron, oxyCODONE  Exam: HEENT: No distress, sitting up in bed Heart: RRR no mrg LUngs: clear bilat Abdomen: bS pos. Soft NT,ND Extremities: L BKA  Stump no edema , healed, R Ft Bandage dry /clean not removed with R Grt And 2nd toes amputated  Skin: warm dry  No overt rash Neuro: Alert, OX3 moves ext x 4 Dialysis Access: LUA AVF pos Bruit   Dialysis: [facility] MWF 4h   74.5kg   2/2  Hep 2400   LUA AVF Calcitriol 1.56mcg / Venofer 100 mg  Next 2 TXs  22nd, 24th   Assessment: 1. Acute resp failure/ pulm edema/ vol excess - resolved with HD x 1.  Is at his dry wt now, but will need lower dry wt given pulm edema episode.  OK for dc today, will go to his OP HD tomorrow and will lower dry wt to 72.5kg and challenge from there.  2. ESRD - MWF HD 3. Hypertension/volume  - BP's better, cont coreg and amlod, lower dry wt further as  OP 4. Anemia  - hgb 12 no esa need, fe  On hd mon/wed 5. Metabolic bone disease -   Po calcitriol  M/w hd tx days / phoslo binder 6. PVD  SP L BKA, R Grt toe 2nd toe amp 7. DM type 1 - per admit   Plan - as above, have d/w primary MD    Kelly Splinter MD [place] pager [phone]   06/05/2017, 11:13 AM    Recent Labs Lab 06/04/17 1000 06/05/17 0825  NA 137 134*  K 3.4* 4.1  CL 94* 97*  CO2 24 26  GLUCOSE 209* 125*  BUN 25* 25*  CREATININE 6.16* 5.70*  CALCIUM 9.6 9.2   No results for input(s): AST, ALT, ALKPHOS, BILITOT, PROT, ALBUMIN in the last 168 hours.  Recent Labs Lab 06/04/17 1000 06/05/17 0825  WBC 9.3 8.5  NEUTROABS 7.0  --   HGB 12.1* 10.6*  HCT 37.0* 33.1*  MCV 82.2 82.1  PLT 271 195   Iron/TIBC/Ferritin/ %Sat    Component Value Date/Time   IRON 27 (L) 03/16/2017 1142   TIBC 188 (L) 03/16/2017  [place] 03/16/2017 1142   IRONPCTSAT 14 (L) 03/16/2017 1142

## 2017-06-05 NOTE — Progress Notes (Signed)
Patient d/c back to [facility] transported by [employer] discharge packet sent with [facility] services. Attempted to call report x 2 no answer.  Tehran Rabenold, Tivis Ringer, RN

## 2017-06-05 NOTE — Progress Notes (Signed)
Discharge to: [facility] Anticipated discharge date: 06/05/17 Transportation by: [facility]  Report #: [phone], ask for [place] signing off.  Laveda Abbe LCSW [phone]

## 2017-10-28 ENCOUNTER — Other Ambulatory Visit: Payer: Self-pay

## 2017-10-28 ENCOUNTER — Inpatient Hospital Stay (HOSPITAL_COMMUNITY)
Admission: EM | Admit: 2017-10-28 | Discharge: 2017-11-09 | DRG: 233 | Disposition: A | Discharge status: Skilled Nursing Facility | Payer: Medicaid Other | Attending: Thoracic Surgery (Cardiothoracic Vascular Surgery) | Admitting: Thoracic Surgery (Cardiothoracic Vascular Surgery)

## 2017-10-28 ENCOUNTER — Emergency Department (HOSPITAL_COMMUNITY): Payer: Medicaid Other

## 2017-10-28 ENCOUNTER — Encounter (HOSPITAL_COMMUNITY): Payer: Self-pay | Admitting: *Deleted

## 2017-10-28 DIAGNOSIS — I132 Hypertensive heart and chronic kidney disease with heart failure and with stage 5 chronic kidney disease, or end stage renal disease: Secondary | ICD-10-CM | POA: Diagnosis not present

## 2017-10-28 DIAGNOSIS — Z79899 Other long term (current) drug therapy: Secondary | ICD-10-CM | POA: Diagnosis not present

## 2017-10-28 DIAGNOSIS — Z01810 Encounter for preprocedural cardiovascular examination: Secondary | ICD-10-CM | POA: Diagnosis not present

## 2017-10-28 DIAGNOSIS — K219 Gastro-esophageal reflux disease without esophagitis: Secondary | ICD-10-CM | POA: Diagnosis present

## 2017-10-28 DIAGNOSIS — Z992 Dependence on renal dialysis: Secondary | ICD-10-CM

## 2017-10-28 DIAGNOSIS — E1022 Type 1 diabetes mellitus with diabetic chronic kidney disease: Secondary | ICD-10-CM | POA: Diagnosis not present

## 2017-10-28 DIAGNOSIS — Z89421 Acquired absence of other right toe(s): Secondary | ICD-10-CM

## 2017-10-28 DIAGNOSIS — Z89411 Acquired absence of right great toe: Secondary | ICD-10-CM | POA: Diagnosis not present

## 2017-10-28 DIAGNOSIS — F17210 Nicotine dependence, cigarettes, uncomplicated: Secondary | ICD-10-CM | POA: Diagnosis not present

## 2017-10-28 DIAGNOSIS — I5030 Unspecified diastolic (congestive) heart failure: Secondary | ICD-10-CM | POA: Diagnosis not present

## 2017-10-28 DIAGNOSIS — Z91018 Allergy to other foods: Secondary | ICD-10-CM

## 2017-10-28 DIAGNOSIS — E1040 Type 1 diabetes mellitus with diabetic neuropathy, unspecified: Secondary | ICD-10-CM | POA: Diagnosis present

## 2017-10-28 DIAGNOSIS — Z7902 Long term (current) use of antithrombotics/antiplatelets: Secondary | ICD-10-CM | POA: Diagnosis not present

## 2017-10-28 DIAGNOSIS — I5042 Chronic combined systolic (congestive) and diastolic (congestive) heart failure: Secondary | ICD-10-CM | POA: Diagnosis present

## 2017-10-28 DIAGNOSIS — R079 Chest pain, unspecified: Secondary | ICD-10-CM | POA: Diagnosis not present

## 2017-10-28 DIAGNOSIS — Z794 Long term (current) use of insulin: Secondary | ICD-10-CM | POA: Diagnosis not present

## 2017-10-28 DIAGNOSIS — I214 Non-ST elevation (NSTEMI) myocardial infarction: Secondary | ICD-10-CM | POA: Diagnosis present

## 2017-10-28 DIAGNOSIS — I25110 Atherosclerotic heart disease of native coronary artery with unstable angina pectoris: Secondary | ICD-10-CM | POA: Diagnosis not present

## 2017-10-28 DIAGNOSIS — Z89512 Acquired absence of left leg below knee: Secondary | ICD-10-CM

## 2017-10-28 DIAGNOSIS — D62 Acute posthemorrhagic anemia: Secondary | ICD-10-CM | POA: Diagnosis not present

## 2017-10-28 DIAGNOSIS — I25119 Atherosclerotic heart disease of native coronary artery with unspecified angina pectoris: Secondary | ICD-10-CM | POA: Diagnosis not present

## 2017-10-28 DIAGNOSIS — N186 End stage renal disease: Secondary | ICD-10-CM | POA: Diagnosis present

## 2017-10-28 DIAGNOSIS — I249 Acute ischemic heart disease, unspecified: Secondary | ICD-10-CM | POA: Diagnosis not present

## 2017-10-28 DIAGNOSIS — E785 Hyperlipidemia, unspecified: Secondary | ICD-10-CM | POA: Diagnosis not present

## 2017-10-28 DIAGNOSIS — E1122 Type 2 diabetes mellitus with diabetic chronic kidney disease: Secondary | ICD-10-CM | POA: Diagnosis not present

## 2017-10-28 DIAGNOSIS — I2510 Atherosclerotic heart disease of native coronary artery without angina pectoris: Secondary | ICD-10-CM | POA: Diagnosis not present

## 2017-10-28 DIAGNOSIS — Z951 Presence of aortocoronary bypass graft: Secondary | ICD-10-CM

## 2017-10-28 DIAGNOSIS — Z89432 Acquired absence of left foot: Secondary | ICD-10-CM

## 2017-10-28 DIAGNOSIS — J9811 Atelectasis: Secondary | ICD-10-CM

## 2017-10-28 DIAGNOSIS — R072 Precordial pain: Secondary | ICD-10-CM | POA: Diagnosis not present

## 2017-10-28 DIAGNOSIS — G8929 Other chronic pain: Secondary | ICD-10-CM | POA: Diagnosis present

## 2017-10-28 DIAGNOSIS — R0789 Other chest pain: Secondary | ICD-10-CM | POA: Diagnosis present

## 2017-10-28 DIAGNOSIS — D631 Anemia in chronic kidney disease: Secondary | ICD-10-CM | POA: Diagnosis not present

## 2017-10-28 DIAGNOSIS — I10 Essential (primary) hypertension: Secondary | ICD-10-CM | POA: Diagnosis not present

## 2017-10-28 DIAGNOSIS — I2584 Coronary atherosclerosis due to calcified coronary lesion: Secondary | ICD-10-CM | POA: Diagnosis present

## 2017-10-28 LAB — CBC WITH DIFFERENTIAL/PLATELET
BASOS ABS: 0 10*3/uL (ref 0.0–0.1)
BASOS PCT: 0 %
EOS ABS: 0.2 10*3/uL (ref 0.0–0.7)
Eosinophils Relative: 2 %
HEMATOCRIT: 41.8 % (ref 39.0–52.0)
HEMOGLOBIN: 14 g/dL (ref 13.0–17.0)
Lymphocytes Relative: 26 %
Lymphs Abs: 1.9 10*3/uL (ref 0.7–4.0)
MCH: 29.1 pg (ref 26.0–34.0)
MCHC: 33.5 g/dL (ref 30.0–36.0)
MCV: 86.9 fL (ref 78.0–100.0)
Monocytes Absolute: 0.4 10*3/uL (ref 0.1–1.0)
Monocytes Relative: 6 %
NEUTROS ABS: 4.9 10*3/uL (ref 1.7–7.7)
NEUTROS PCT: 66 %
Platelets: 350 10*3/uL (ref 150–400)
RBC: 4.81 MIL/uL (ref 4.22–5.81)
RDW: 15.8 % — AB (ref 11.5–15.5)
WBC: 7.4 10*3/uL (ref 4.0–10.5)

## 2017-10-28 LAB — HEPATIC FUNCTION PANEL
ALT: 19 U/L (ref 17–63)
AST: 18 U/L (ref 15–41)
Albumin: 3.5 g/dL (ref 3.5–5.0)
Alkaline Phosphatase: 98 U/L (ref 38–126)
BILIRUBIN TOTAL: 0.7 mg/dL (ref 0.3–1.2)
Bilirubin, Direct: <0.1 mg/dL — ABNORMAL LOW (ref 0.1–0.5)
Total Protein: 7.1 g/dL (ref 6.5–8.1)

## 2017-10-28 LAB — BASIC METABOLIC PANEL
ANION GAP: 19 — AB (ref 5–15)
BUN: 68 mg/dL — ABNORMAL HIGH (ref 6–20)
CHLORIDE: 90 mmol/L — AB (ref 101–111)
CO2: 22 mmol/L (ref 22–32)
Calcium: 9 mg/dL (ref 8.9–10.3)
Creatinine, Ser: 12.66 mg/dL — ABNORMAL HIGH (ref 0.61–1.24)
GFR calc non Af Amer: 4 mL/min — ABNORMAL LOW (ref >60)
GFR, EST AFRICAN AMERICAN: 5 mL/min — AB (ref >60)
Glucose, Bld: 322 mg/dL — ABNORMAL HIGH (ref 65–99)
POTASSIUM: 4 mmol/L (ref 3.5–5.1)
SODIUM: 131 mmol/L — AB (ref 135–145)

## 2017-10-28 LAB — I-STAT TROPONIN, ED
TROPONIN I, POC: 0.03 ng/mL (ref 0.00–0.08)
TROPONIN I, POC: 0.07 ng/mL (ref 0.00–0.08)

## 2017-10-28 LAB — D-DIMER, QUANTITATIVE: D-Dimer, Quant: <0.27 ug/mL-FEU (ref 0.00–0.50)

## 2017-10-28 LAB — TROPONIN I: Troponin I: 0.1 ng/mL (ref <0.03)

## 2017-10-28 MED ORDER — NITROGLYCERIN 0.4 MG SL SUBL
0.4000 mg | SUBLINGUAL_TABLET | SUBLINGUAL | Status: AC | PRN
  Administered 2017-10-28 – 2017-10-29 (×3): 0.4 mg via SUBLINGUAL
  Filled 2017-10-28 (×2): qty 1

## 2017-10-28 MED ORDER — ASPIRIN 81 MG PO CHEW: 324.0000 mg | CHEWABLE_TABLET | Freq: Once | ORAL | Status: DC

## 2017-10-28 NOTE — ED Notes (Signed)
Labs added onto samples currently in main lab

## 2017-10-28 NOTE — ED Notes (Signed)
Date and time results received: 10/28/17 11:26 PM    Test: Trop I Critical Value: 0.10  Name of Provider Notified: Emmaline Life

## 2017-10-28 NOTE — ED Notes (Signed)
Pt lying in bed resting quietly, no complaints at this time

## 2017-10-28 NOTE — H&P (Addendum)
[facility] Admission History and Physical Service Pager: [phone]  Patient name: [name] Agustin Sr. Medical record number: [id number] Date of birth: Jun 23, 1976 Age: 42 y.o. Gender: male  Primary Care Provider: Patient, [name] Per Consultants: None Code Status: Full  Chief Complaint: chest pain   Assessment and Plan: [name] Cato Sr. is a 42 y.o. male presenting with chest pain . PMH is significant for T2DM, CAD, HTN, S/P unilateral BKA, ESRD, HrEF, PAD   ACS R/o: HEART score 4. 10/10 intermittent midsternal chest pain that responds well to nitro per patient, radiating to right arm w/ nausea (vomiting x3). Provides a story of unstable angina.  He states that he has been having intermittent chest pain for the past week initially this chest pain started with exertion only.  However recently has been occurring now when patient is at rest.  Patient notes shortness of breath and nausea. Troponins are uptrending 0.03>0.07>0.10.   ECG with new twave inversions in inferior/lateral.   D-dimer neg.  CXR neg.  Aspirin 324 in ED. Hx of Left heart Cath in 2016 that showed lesions including 60% LAD and 90% OM2 stenoses. -admit to tele, Dr. Andria Frames -will consult cards in AM if patient stays stable -am ECG -trend troponins -A1c, TSH, lipid -daily bmp/cbc -HIV -plavix -nitro prn - ECHO  T2DM- Sliding Scale in Nursing Home -SSI w/ cbgs  Chronic HFrEF, grade 2DD.  Last echo 2016 with EF of 50-55.  Patient does not appear to be taking meds.  No current edema/SOB orthopnea. Euvolemic on exam.  -will discuss further with patient, will needed to optimized  - ECHO pending   HTN:  -home amlodipine 10  CAD- s/p left BKA -no current complaints/lesions reported  -home plavix  ESRD: on HD MWF. Missed HD today for housing meeting -consult nephro in AM -home calcitrol - Home phoslo  chronic pain: -home robaxin  Tobacco Abuse  - Nictonine patch  Mood  disorder -home lamictal 150  Nausea: vomited 3x during the last few days, no nausea on admission exam -zophran prn  FEN/GI: NPO for now,  renal/carb modified, prostat supplement, colace prn, miralax prn Prophylaxis: heparin (ESRD)  Disposition: back to prior SNF upon medical clearance  History of Present Illness:  [name] Ventrella Sr. is a 42 y.o. male presenting with chest pain, midsternal. Today has severe chest pain, radiates to th right wrist.  Patient also indicates being nauseated, also feels SOB during these episodes. He feels like it started with activity but has now gotten to where he can have the pains when he is resting.  Patient started having chest pain last week. Patient vomiting x3 with no signs of blood.   He denies recent sickness.   He has not had visual changes. States he takes his meds as administered at the SNF.  He denies illicit drugs and significant alcohol use although he smokes regularly and would like a nicotine patch.   He did miss Fridays dialysis due to a meeting but had a full session on Wednesday.  He feels well when the chest pain is not occurring.  Review Of Systems: Per HPI with the following additions:   Review of Systems  Constitutional: Negative for chills and fever.  HENT: Negative for congestion, ear pain and hearing loss.   Respiratory: Positive for shortness of breath. Negative for cough and sputum production.   Cardiovascular: Positive for chest pain and palpitations. Negative for PND.  Gastrointestinal: Positive for nausea and vomiting.  Genitourinary: Positive for dysuria. Negative for urgency.  Skin: Negative for rash.  Neurological: Negative for dizziness, sensory change, focal weakness and headaches.  Psychiatric/Behavioral: The patient is nervous/anxious.     Patient Active Problem List   Diagnosis Date Noted  . Flash pulmonary edema ([place]) 06/04/2017  . Hypertensive emergency 06/04/2017  . Hypertensive heart and kidney disease  with acute on chronic combined systolic and diastolic congestive heart failure and stage 5 chronic kidney disease on chronic dialysis ([place]) 06/04/2017  . S/P unilateral BKA (below knee amputation), left ([place])   . Post-operative pain   . Acute blood loss anemia   . Chronic combined systolic and diastolic CHF (congestive heart failure) ([place])   . PVD (peripheral vascular disease) ([place])   . Coronary artery disease involving native coronary artery of native heart without angina pectoris   . Tobacco abuse   . Diabetic foot infection ([place]) 03/08/2017  . Osteomyelitis ([place]) 03/08/2017  . Wound dehiscence   . Diabetes mellitus with complication ([place])   . Dehiscence of amputation stump ([place]) 01/04/2017  . S/P transmetatarsal amputation of foot, left ([place]) 11/16/2016  . Gangrene of right foot ([place]) 08/02/2016  . Achilles tendon contracture, left 08/02/2016  . Anemia of renal disease 08/16/2015  . Wound infection 08/15/2015  . Diabetic ulcer of right great toe ([place]) 08/15/2015  . Pain in the chest   . Elevated troponin   . Essential hypertension   . ESRD on dialysis ([place]) 08/07/2013  . Diabetes ([place]) 08/07/2013  . Chest pain 05/16/2012  . Murmur 05/16/2012  . Renal disorder     Past Medical History: Past Medical History:  Diagnosis Date  . Anemia   . Atherosclerosis of lower extremity ([place])   . CAD (coronary artery disease)   . Chronic combined systolic and diastolic heart failure ([place])   . Complication of anesthesia   . ESRD (end stage renal disease) on dialysis [facility])    "MWF [facility]" (03/08/2017)  . GERD (gastroesophageal reflux disease)   . Heart murmur   . History of blood transfusion    "related to OR"  . Hypertension   . PONV (postoperative nausea and vomiting)   . Type II diabetes mellitus ([place])     Past Surgical History: Past Surgical History:  Procedure Laterality Date  . ABDOMINAL AORTOGRAM N/A 11/02/2016   Procedure: Abdominal Aortogram;  Surgeon: Waynetta Sandy, MD;  Location: [place] [facility];  Service: Cardiovascular;  Laterality: N/A;  . AMPUTATION Left 09/27/2013   Procedure: LEFT GREAT TOE AMPUTATION;  Surgeon: [name], MD;  Location: [place];  Service: Orthopedics;  Laterality: Left;  . AMPUTATION Right 08/15/2015   Procedure: Right Great Toe Amputation;  Surgeon: [name], MD;  Location: [place];  Service: Orthopedics;  Laterality: Right;  . AMPUTATION Left 11/05/2016   Procedure: TRANSMETATARSAL AMPUTATION LEFT FOOT;  Surgeon: [name], MD;  Location: [place];  Service: Orthopedics;  Laterality: Left;  . AMPUTATION Left 03/11/2017   Procedure: LEFT BELOW KNEE AMPUTATION;  Surgeon: [name], MD;  Location: [place];  Service: Orthopedics;  Laterality: Left;  . AMPUTATION Right 03/11/2017   Procedure: RIGHT 2ND TOE AMPUTATION;  Surgeon: [name], MD;  Location: [place];  Service: Orthopedics;  Laterality: Right;  . AV FISTULA PLACEMENT  left arm  . LEFT HEART CATHETERIZATION WITH CORONARY ANGIOGRAM N/A 09/13/2014   Procedure: LEFT HEART CATHETERIZATION WITH CORONARY ANGIOGRAM;  Surgeon: Sinclair Grooms, MD;  Location:  [place] [facility];  Service: Cardiovascular;  Laterality: N/A;  . LOWER EXTREMITY ANGIOGRAPHY Bilateral 11/02/2016   Procedure: Lower Extremity Angiography;  Surgeon: Waynetta Sandy, MD;  Location: [place] [facility];  Service: Cardiovascular;  Laterality: Bilateral;  . PERIPHERAL VASCULAR ATHERECTOMY Left 11/02/2016   Procedure: Peripheral Vascular Atherectomy;  Surgeon: Waynetta Sandy, MD;  Location: [place] [facility];  Service: Cardiovascular;  Laterality: Left;  PERONEAL  . STUMP REVISION Left 01/11/2017   Procedure: Revision Left Transmetatarsal Amputation;  Surgeon: [name], MD;  Location: [place];  Service: Orthopedics;  Laterality: Left;    Social History: Social History   Tobacco Use  . Smoking status: Current Every Day Smoker    Packs/day: 0.50    Years: 26.00     Pack years: 13.00    Types: Cigarettes  . Smokeless tobacco: Never Used  Substance Use Topics  . Alcohol use: Yes    Comment: 03/08/2017 "haven't took a drink in over 5 years"  . Drug use: Yes    Types: Marijuana    Comment: 03/08/2017 "basically q day"    Additional social history:   Please also refer to relevant sections of EMR.  Family History: Family History  Problem Relation Age of Onset  . Heart failure Mother   . Hypertension Mother    (If not completed, MUST add something in)  Allergies and Medications: Allergies  Allergen Reactions  . Coconut Oil Anaphylaxis    Can use topically, allergic to coconut foods    No current facility-administered medications on file prior to encounter.    Current Outpatient Medications on File Prior to Encounter  Medication Sig Dispense Refill  . acetaminophen (TYLENOL) 500 MG tablet Take 1,000 mg by mouth every 6 (six) hours as needed (for mild pain and CANNOT EXCEED 4,000 MG/24 HOURS).     Marland Kitchen amLODipine (NORVASC) 10 MG tablet Take 1 tablet (10 mg total) by mouth at bedtime. 30 tablet 0  . B Complex-C-Folic Acid (NEPHRO-VITE PO) Take 1 tablet by mouth at bedtime.    . calcitRIOL (ROCALTROL) 0.25 MCG capsule Take 9 capsules (2.25 mcg total) by mouth every Monday, Wednesday, and Friday with hemodialysis.    Marland Kitchen calcium acetate (PHOSLO) 667 MG capsule Take 3 capsules (2,001 mg total) by mouth 3 (three) times daily with meals. (Patient taking differently: Take 1,334 mg by mouth 3 (three) times daily with meals. )    . clopidogrel (PLAVIX) 75 MG tablet Take 1 tablet (75 mg total) by mouth daily. 30 tablet 3  . docusate sodium (COLACE) 100 MG capsule Take 1 capsule (100 mg total) by mouth 2 (two) times daily. (Patient taking differently: Take 100 mg by mouth at bedtime. ) 10 capsule 0  . furosemide (LASIX) 20 MG tablet Take 20 mg by mouth 2 (two) times daily.    Marland Kitchen glucagon (GLUCAGON EMERGENCY) 1 MG injection Inject 1 mg into the vein once as needed  (low blood sugar and inability to eat or drink sugar). 1 each 0  . insulin aspart (NOVOLOG) 100 UNIT/ML injection Sliding scale CBG 70 - 120: 0 units CBG 121 - 150: 1 unit,  CBG 151 - 200: 2 units,  CBG 201 - 250: 3 units,  CBG 251 - 300: 5 units,  CBG 301 - 350: 7 units,  CBG 351 - 400: 9 units   CBG > 400: 9 units and notify your MD (Patient taking differently: Inject 0-9 Units into the skin See admin instructions. Inject 0-9  units into the skin two times a day per sliding scale: BGL 70-120 = 0 units; 121-150 = 1 unit; 151-200 = 2 units; 201-250 = 3 units; 251-300 = 5 units; 301-350 = 7 units; 351-400 = 9 units; >400 = 9 units and call MD) 10 mL 11  . insulin glargine (LANTUS) 100 UNIT/ML injection Inject 0.07 mLs (7 Units total) into the skin at bedtime. 10 mL 12  . ipratropium-albuterol (DUONEB) 0.5-2.5 (3) MG/3ML SOLN Take 3 mLs by nebulization 3 (three) times daily as needed (for shortness of breath).     . lamoTRIgine (LAMICTAL) 150 MG tablet Take 150 mg by mouth at bedtime.    Marland Kitchen lanthanum (FOSRENOL) 1000 MG chewable tablet Chew 2 tablets (2,000 mg total) by mouth 3 (three) times daily with meals. 180 tablet 0  . methocarbamol (ROBAXIN) 500 MG tablet Take 1 tablet (500 mg total) by mouth every 6 (six) hours as needed for muscle spasms.    . Nutritional Supplements (BOOST GLUCOSE CONTROL) LIQD Take 8 oz by mouth as directed.    . ondansetron (ZOFRAN) 4 MG tablet Take 4 mg by mouth every 6 (six) hours as needed for nausea.     . polyethylene glycol (MIRALAX / GLYCOLAX) packet Take 17 g by mouth daily. (Patient taking differently: Take 17 g by mouth daily as needed for mild constipation. ) 14 each 0  . Amino Acids-Protein Hydrolys (FEEDING SUPPLEMENT, PRO-STAT SUGAR FREE 64,) LIQD Take 30 mLs by mouth 2 (two) times daily. (Patient not taking: Reported on 10/28/2017) 900 mL 0  . carvedilol (COREG) 6.25 MG tablet Take 1 tablet (6.25 mg total) by mouth 2 (two) times daily with a meal. (Patient not taking:  Reported on 10/28/2017)    . fentaNYL (DURAGESIC - DOSED MCG/HR) 12 MCG/HR Place 1 patch (12.5 mcg total) onto the skin every 3 (three) days. (Patient not taking: Reported on 10/28/2017) 5 patch 0  . multivitamin (RENA-VIT) TABS tablet Take 1 tablet by mouth at bedtime. (Patient not taking: Reported on 10/28/2017)  0  . nicotine (NICODERM CQ - DOSED IN MG/24 HOURS) 21 mg/24hr patch Place 1 patch (21 mg total) onto the skin daily. (Patient not taking: Reported on 10/28/2017) 28 patch 0  . oxyCODONE (OXY IR/ROXICODONE) 5 MG immediate release tablet Take 1-2 tablets (5-10 mg total) by mouth every 4 (four) hours as needed for breakthrough pain. (Patient not taking: Reported on 10/28/2017) 20 tablet 0  . [DISCONTINUED] lisinopril (PRINIVIL,ZESTRIL) 10 MG tablet Take 1 tablet (10 mg total) by mouth daily. (Patient not taking: Reported on 04/24/2015) 30 tablet 0  . [DISCONTINUED] metoprolol tartrate (LOPRESSOR) 25 MG tablet Take 1 tablet (25 mg total) by mouth 2 (two) times daily. (Patient not taking: Reported on 04/24/2015) 60 tablet 0  . [DISCONTINUED] pantoprazole (PROTONIX) 40 MG tablet Take 1 tablet (40 mg total) by mouth daily. (Patient not taking: Reported on 04/24/2015) 30 tablet 0    Objective: BP (!) 182/60   Pulse 72   Temp 98 F (36.7 C) (Oral)   Resp 15   Ht 6\' 2"  (1.88 m)   Wt 158 lb (71.7 kg)   SpO2 100%   BMI 20.29 kg/m  Exam: General: uncomfortable on admission interview, was angry to not already be upstairs Eyes: clear sclear, no drainage, EOMI ENTM: no rhinorhea/epistaxis, MMM Neck: no lesions, no rom deficits Cardiovascular: RRR, mumur vs referred fistula sounds from right arm HD fistula Respiratory: CTAB, no wheezes, no IWB Gastrointestinal: no TTP, no masses, soft  belly MSK: left leg BKA, no gross motor deficits Derm: no rashes/complaints Neuro: grossly intact Psych: frustrated but appropriate thought processing  Labs and Imaging: CBC BMET  Recent Labs  Lab 10/28/17 1530   WBC 7.4  HGB 14.0  HCT 41.8  PLT 350   Recent Labs  Lab 10/28/17 1530  NA 131*  K 4.0  CL 90*  CO2 22  BUN 68*  CREATININE 12.66*  GLUCOSE 322*  CALCIUM 9.0     Trop 0.03>0.07>0.10  Sherene Sires, DO 10/28/2017, 8:22 PM PGY-1, [place] Intern pager: [phone], text pages welcome  UPPER LEVEL ADDENDUM  I have read the above note and made revisions highlighted in blue.  Kerrin Mo, MD, PGY-3 [facility]

## 2017-10-28 NOTE — ED Notes (Signed)
Pt informed that he has been placed up for admission; pt refusing at this time, Addison Lank, MD aware and to speak to patient

## 2017-10-28 NOTE — ED Notes (Signed)
Pt now agreeing to stay for admission; Eddie North also agree to hold his bed per Mariann Laster, [employer]

## 2017-10-28 NOTE — ED Triage Notes (Signed)
C/o chest pain onset 1 hour states he was just sitting in his room. Non-radiating. C/o pain worse with movement. Denies cough. Vomited x1 States the staff at nursing home gave him 324mg  ASA, chest pain was 10/10 was given 2 NTG pain went to 6/10.  Patient is a M-W=F dialysis didn't go today, had an appointment he couldn't miss.

## 2017-10-28 NOTE — ED Provider Notes (Addendum)
[facility] EMERGENCY DEPARTMENT Provider Note  CSN: [id number] Arrival date & time: 10/28/17 1455  Chief Complaint(s) Chest Pain  HPI [name] Mcmichen Sr. is a 42 y.o. male   The history is provided by the patient.  Chest Pain   This is a recurrent problem. The current episode started 1 to 2 hours ago. The problem occurs constantly. The problem has not changed since onset.The pain is associated with rest. Pain location: right substernal. The pain is moderate. The quality of the pain is described as burning (and throbbing). The pain radiates to the right arm. The symptoms are aggravated by deep breathing. Pertinent negatives include no cough, no diaphoresis, no dizziness, no exertional chest pressure, no fever, no headaches, no leg pain, no lower extremity edema, no malaise/fatigue, no nausea, no palpitations, no shortness of breath and no vomiting. He has tried nitroglycerin for the symptoms. The treatment provided moderate relief. Risk factors include smoking/tobacco exposure, substance abuse and male gender.  His past medical history is significant for CAD, CHF, diabetes, hyperlipidemia and hypertension.  Pertinent negatives for past medical history include no DVT, no MI, no PE, no strokes and no TIA.  Procedure history is negative for cardiac catheterization.    Past Medical History Past Medical History:  Diagnosis Date  . Anemia   . Atherosclerosis of lower extremity ([place])   . CAD (coronary artery disease)   . Chronic combined systolic and diastolic heart failure ([place])   . Complication of anesthesia   . ESRD (end stage renal disease) on dialysis [facility])    "MWF [employer]" (03/08/2017)  . GERD (gastroesophageal reflux disease)   . Heart murmur   . History of blood transfusion    "related to OR"  . Hypertension   . PONV (postoperative nausea and vomiting)   . Type II diabetes mellitus [facility])    Patient Active Problem List   Diagnosis Date Noted  . ACS  (acute coronary syndrome) ([place]) 10/28/2017  . Flash pulmonary edema ([place]) 06/04/2017  . Hypertensive emergency 06/04/2017  . 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 S/P unilateral BKA (below knee amputation), left ([place])   . Post-operative pain   . Acute blood loss anemia   . Chronic combined systolic and diastolic CHF (congestive heart failure) ([place])   . PVD (peripheral vascular disease) ([place])   . Coronary artery disease involving native coronary artery of native heart without angina pectoris   . Tobacco abuse   . Diabetic foot infection ([place]) 03/08/2017  . Osteomyelitis ([place]) 03/08/2017  . Wound dehiscence   . Diabetes mellitus with complication ([place])   . Dehiscence of amputation stump ([place]) 01/04/2017  . S/P transmetatarsal amputation of foot, left ([place]) 11/16/2016  . Gangrene of right foot ([place]) 08/02/2016  . Achilles tendon contracture, left 08/02/2016  . Anemia of renal disease 08/16/2015  . Wound infection 08/15/2015  . Diabetic ulcer of right great toe ([place]) 08/15/2015  . Pain in the chest   . Elevated troponin   . Essential hypertension   . ESRD on dialysis ([place]) 08/07/2013  . Diabetes ([place]) 08/07/2013  . Chest pain 05/16/2012  . Murmur 05/16/2012  . Renal disorder    Home Medication(s) Prior to Admission medications   Medication Sig Start Date End Date Taking? Authorizing Provider  acetaminophen (TYLENOL) 500 MG tablet Take 1,000 mg by mouth every 6 (  six) hours as needed (for mild pain and CANNOT EXCEED 4,000 MG/24 HOURS).    Yes [provider]  amLODipine (NORVASC) 10 MG tablet Take 1 tablet (10 mg total) by mouth at bedtime. 08/16/15  Yes Short, Noah Delaine, MD  B Complex-C-Folic Acid (NEPHRO-VITE PO) Take 1 tablet by mouth at bedtime.   Yes [provider]  calcitRIOL (ROCALTROL) 0.25 MCG capsule Take  9 capsules (2.25 mcg total) by mouth every Monday, Wednesday, and Friday with hemodialysis. 03/18/17  Yes Rai, Ripudeep K, MD  calcium acetate (PHOSLO) 667 MG capsule Take 3 capsules (2,001 mg total) by mouth 3 (three) times daily with meals. Patient taking differently: Take 1,334 mg by mouth 3 (three) times daily with meals.  03/17/17  Yes Rai, Ripudeep K, MD  clopidogrel (PLAVIX) 75 MG tablet Take 1 tablet (75 mg total) by mouth daily. 11/02/16  Yes Waynetta Sandy, MD  docusate sodium (COLACE) 100 MG capsule Take 1 capsule (100 mg total) by mouth 2 (two) times daily. Patient taking differently: Take 100 mg by mouth at bedtime.  03/17/17  Yes Rai, Ripudeep K, MD  furosemide (LASIX) 20 MG tablet Take 20 mg by mouth 2 (two) times daily.   Yes [provider]  glucagon (GLUCAGON EMERGENCY) 1 MG injection Inject 1 mg into the vein once as needed (low blood sugar and inability to eat or drink sugar). 08/16/15  Yes Short, Noah Delaine, MD  insulin aspart (NOVOLOG) 100 UNIT/ML injection Sliding scale CBG 70 - 120: 0 units CBG 121 - 150: 1 unit,  CBG 151 - 200: 2 units,  CBG 201 - 250: 3 units,  CBG 251 - 300: 5 units,  CBG 301 - 350: 7 units,  CBG 351 - 400: 9 units   CBG > 400: 9 units and notify your MD Patient taking differently: Inject 0-9 Units into the skin See admin instructions. Inject 0-9 units into the skin two times a day per sliding scale: BGL 70-120 = 0 units; 121-150 = 1 unit; 151-200 = 2 units; 201-250 = 3 units; 251-300 = 5 units; 301-350 = 7 units; 351-400 = 9 units; >400 = 9 units and call MD 03/17/17  Yes Rai, Ripudeep K, MD  insulin glargine (LANTUS) 100 UNIT/ML injection Inject 0.07 mLs (7 Units total) into the skin at bedtime. 03/17/17  Yes Rai, Ripudeep K, MD  ipratropium-albuterol (DUONEB) 0.5-2.5 (3) MG/3ML SOLN Take 3 mLs by nebulization 3 (three) times daily as needed (for shortness of breath).    Yes [provider]  lamoTRIgine (LAMICTAL) 150 MG tablet Take 150 mg by  mouth at bedtime.   Yes [provider]  lanthanum (FOSRENOL) 1000 MG chewable tablet Chew 2 tablets (2,000 mg total) by mouth 3 (three) times daily with meals. 08/16/15  Yes Short, Noah Delaine, MD  methocarbamol (ROBAXIN) 500 MG tablet Take 1 tablet (500 mg total) by mouth every 6 (six) hours as needed for muscle spasms. 03/17/17  Yes Rai, Ripudeep K, MD  Nutritional Supplements (BOOST GLUCOSE CONTROL) LIQD Take 8 oz by mouth as directed.   Yes [provider]  ondansetron (ZOFRAN) 4 MG tablet Take 4 mg by mouth every 6 (six) hours as needed for nausea.    Yes [provider]  polyethylene glycol (MIRALAX / GLYCOLAX) packet Take 17 g by mouth daily. Patient taking differently: Take 17 g by mouth daily as needed for mild constipation.  03/17/17  Yes Rai, Vernelle Emerald, MD  Amino Acids-Protein Hydrolys (FEEDING SUPPLEMENT, PRO-STAT SUGAR  FREE 64,) LIQD Take 30 mLs by mouth 2 (two) times daily. Patient not taking: Reported on 10/28/2017 03/17/17   Mendel Corning, MD  carvedilol (COREG) 6.25 MG tablet Take 1 tablet (6.25 mg total) by mouth 2 (two) times daily with a meal. Patient not taking: Reported on 10/28/2017 03/17/17   Rai, Vernelle Emerald, MD  fentaNYL (DURAGESIC - DOSED MCG/HR) 12 MCG/HR Place 1 patch (12.5 mcg total) onto the skin every 3 (three) days. Patient not taking: Reported on 10/28/2017 03/18/17   Mendel Corning, MD  multivitamin (RENA-VIT) TABS tablet Take 1 tablet by mouth at bedtime. Patient not taking: Reported on 10/28/2017 03/17/17   Rai, Vernelle Emerald, MD  nicotine (NICODERM CQ - DOSED IN MG/24 HOURS) 21 mg/24hr patch Place 1 patch (21 mg total) onto the skin daily. Patient not taking: Reported on 10/28/2017 03/17/17   Rai, Vernelle Emerald, MD  oxyCODONE (OXY IR/ROXICODONE) 5 MG immediate release tablet Take 1-2 tablets (5-10 mg total) by mouth every 4 (four) hours as needed for breakthrough pain. Patient not taking: Reported on 10/28/2017 03/17/17   Rai, Vernelle Emerald, MD  lisinopril  (PRINIVIL,ZESTRIL) 10 MG tablet Take 1 tablet (10 mg total) by mouth daily. Patient not taking: Reported on 04/24/2015 09/15/14 04/24/15  Francesca Oman, DO  metoprolol tartrate (LOPRESSOR) 25 MG tablet Take 1 tablet (25 mg total) by mouth 2 (two) times daily. Patient not taking: Reported on 04/24/2015 09/15/14 04/24/15  Francesca Oman, DO  pantoprazole (PROTONIX) 40 MG tablet Take 1 tablet (40 mg total) by mouth daily. Patient not taking: Reported on 04/24/2015 09/15/14 04/24/15  Francesca Oman, DO                                                                                                                                    Past Surgical History Past Surgical History:  Procedure Laterality Date  . ABDOMINAL AORTOGRAM N/A 11/02/2016   Procedure: Abdominal Aortogram;  Surgeon: Waynetta Sandy, MD;  Location: [place] CV LAB;  Service: Cardiovascular;  Laterality: N/A;  . AMPUTATION Left 09/27/2013   Procedure: LEFT GREAT TOE AMPUTATION;  Surgeon: [name], MD;  Location: [place];  Service: Orthopedics;  Laterality: Left;  . AMPUTATION Right 08/15/2015   Procedure: Right Great Toe Amputation;  Surgeon: [name], MD;  Location: [place];  Service: Orthopedics;  Laterality: Right;  . AMPUTATION Left 11/05/2016   Procedure: TRANSMETATARSAL AMPUTATION LEFT FOOT;  Surgeon: [name], MD;  Location: [place];  Service: Orthopedics;  Laterality: Left;  . AMPUTATION Left 03/11/2017   Procedure: LEFT BELOW KNEE AMPUTATION;  Surgeon: [name], MD;  Location: [place];  Service: Orthopedics;  Laterality: Left;  . AMPUTATION Right 03/11/2017   Procedure: RIGHT 2ND TOE AMPUTATION;  Surgeon: [name], MD;  Location: [place];  Service: Orthopedics;  Laterality: Right;  . AV FISTULA PLACEMENT  left arm  .  LEFT HEART CATHETERIZATION WITH CORONARY ANGIOGRAM N/A 09/13/2014   Procedure: LEFT HEART CATHETERIZATION WITH CORONARY ANGIOGRAM;  Surgeon: Sinclair Grooms, MD;  Location[facility] CATH LAB;  Service:  Cardiovascular;  Laterality: N/A;  . LOWER EXTREMITY ANGIOGRAPHY Bilateral 11/02/2016   Procedure: Lower Extremity Angiography;  Surgeon: Waynetta Sandy, MD;  Location: [place] CV LAB;  Service: Cardiovascular;  Laterality: Bilateral;  . PERIPHERAL VASCULAR ATHERECTOMY Left 11/02/2016   Procedure: Peripheral Vascular Atherectomy;  Surgeon: Waynetta Sandy, MD;  Location: [place] CV LAB;  Service: Cardiovascular;  Laterality: Left;  PERONEAL  . STUMP REVISION Left 01/11/2017   Procedure: Revision Left Transmetatarsal Amputation;  Surgeon: [name], MD;  Location: [place];  Service: Orthopedics;  Laterality: Left;   Family History Family History  Problem Relation Age of Onset  . Heart failure Mother   . Hypertension Mother     Social History Social History   Tobacco Use  . Smoking status: Current Every Day Smoker    Packs/day: 0.50    Years: 26.00    Pack years: 13.00    Types: Cigarettes  . Smokeless tobacco: Never Used  Substance Use Topics  . Alcohol use: Yes    Comment: 03/08/2017 "haven't took a drink in over 5 years"  . Drug use: Yes    Types: Marijuana    Comment: 03/08/2017 "basically q day"    Allergies Coconut oil  Review of Systems Review of Systems  Constitutional: Negative for diaphoresis, fever and malaise/fatigue.  Respiratory: Negative for cough and shortness of breath.   Cardiovascular: Positive for chest pain. Negative for palpitations.  Gastrointestinal: Negative for nausea and vomiting.  Neurological: Negative for dizziness and headaches.   All other systems are reviewed and are negative for acute change except as noted in the HPI  Physical Exam Vital Signs  I have reviewed the triage vital signs BP (!) 143/64 (BP Location: Right Arm)   Pulse 82   Temp (!) 96.8 F (36 C) (Oral)   Resp 16   Ht 6\' 2"  (1.88 m)   Wt 71.7 kg (158 lb)   SpO2 95%   BMI 20.29 kg/m   Physical Exam  Constitutional: He is oriented to person,  place, and time. He appears well-developed and well-nourished. No distress.  HENT:  Head: Normocephalic and atraumatic.  Nose: Nose normal.  Eyes: Conjunctivae and EOM are normal. Pupils are equal, round, and reactive to light. Right eye exhibits no discharge. Left eye exhibits no discharge. No scleral icterus.  Neck: Normal range of motion. Neck supple.  Cardiovascular: Normal rate and regular rhythm. Exam reveals no gallop and no friction rub.  No murmur heard. Pulmonary/Chest: Effort normal and breath sounds normal. No stridor. No respiratory distress. He has no rales. He exhibits tenderness.    Abdominal: Soft. He exhibits no distension. There is no tenderness.  Musculoskeletal: He exhibits no edema or tenderness.       Legs: Neurological: He is alert and oriented to person, place, and time.  Skin: Skin is warm and dry. No rash noted. He is not diaphoretic. No erythema.  Psychiatric: He has a normal mood and affect.  Vitals reviewed.   ED Results and Treatments Labs (all labs ordered are listed, but only abnormal results are displayed) Labs Reviewed  BASIC METABOLIC PANEL - Abnormal; Notable for the following components:      Result Value   Sodium 131 (*)    Chloride 90 (*)    Glucose, Bld 322 (*)  BUN 68 (*)    Creatinine, Ser 12.66 (*)    GFR calc non Af Amer 4 (*)    GFR calc Af Amer 5 (*)    Anion gap 19 (*)    All other components within normal limits  CBC WITH DIFFERENTIAL/PLATELET - Abnormal; Notable for the following components:   RDW 15.8 (*)    All other components within normal limits  HEPATIC FUNCTION PANEL - Abnormal; Notable for the following components:   Bilirubin, Direct <0.1 (*)    All other components within normal limits  D-DIMER, QUANTITATIVE (NOT AT [facility])  CBG MONITORING, ED  I-STAT TROPONIN, ED  I-STAT TROPONIN, ED                                                                                                                         EKG  EKG  Interpretation  Date/Time:  Friday October 28 2017 15:02:20 EDT Ventricular Rate:  78 PR Interval:    QRS Duration: 101 QT Interval:  412 QTC Calculation: 470 R Axis:   50 Text Interpretation:  Sinus rhythm Probable left atrial enlargement Abnormal T, consider ischemia, diffuse leads, new from prior Minimal ST elevation, anterior leads Confirmed by Addison Lank [phone]) on 10/28/2017 3:36:22 PM      Radiology Dg Chest 2 View  Result Date: 10/28/2017 CLINICAL DATA:  Hervey Ard right-sided chest pain. EXAM: CHEST - 2 VIEW COMPARISON:  06/04/2017 and 06/10/2015 FINDINGS: The heart size and pulmonary vascularity are normal. Lungs are clear except for slight thickening of the fissures which is chronic. No effusions. Slight calcification in the arch of the aorta. No bone abnormality. IMPRESSION: No active cardiopulmonary disease. Electronically Signed   By: Lorriane Shire M.D.   On: 10/28/2017 16:22   Pertinent labs & imaging results that were available during my care of the patient were reviewed by me and considered in my medical decision making (see chart for details).  Medications Ordered in ED Medications  aspirin chewable tablet 324 mg (324 mg Oral Not Given 10/28/17 1657)  nitroGLYCERIN (NITROSTAT) SL tablet 0.4 mg (0.4 mg Sublingual Given 10/28/17 1721)                                                                                                                                    Procedures Procedures  (including critical care time)  Medical Decision Making / ED Course I have reviewed  the nursing notes for this encounter and the patient's prior records (if available in EHR or on provided paperwork).    Atypical chest pain.  EKG did reveal a new T wave inversions in inferior lateral leads.  Initial troponin negative.  Pain has improved with nitroglycerin and aspirin.  Patient is now chest pain-free.  Heart score greater than 4.  Feel patient requires admission for ACS rule out.  Low  pretest probability for pulmonary embolism.  D-dimer negative.  Doubt pulmonary embolism.  Chest x-ray without evidence suggestive of pneumonia, pneumothorax, pneumomediastinum.  No abnormal contour of the mediastinum to suggest dissection. No evidence of acute injuries.  Presentation not classic for aortic dissection or esophageal perforation.  Labs at patient's baseline and grossly reassuring.  No Indication for emergent dialysis at this time.  We will discussed the case with medicine for admission for ACS rule out.     Final Clinical Impression(s) / ED Diagnoses Final diagnoses:  Atypical chest pain      This chart was dictated using voice recognition software.  Despite best efforts to proofread,  errors can occur which can change the documentation meaning.     Fatima Blank, MD 10/28/17 2033

## 2017-10-28 NOTE — Care Management (Signed)
Patient is from [facility] patient concerned about losing bed if gone for more than 24 hours from facility. ED CM contacted [place] at facility and bedhold will be place, Updated patient and EDP  Patient anticipates returning once medically cleared.

## 2017-10-29 ENCOUNTER — Other Ambulatory Visit: Payer: Self-pay

## 2017-10-29 ENCOUNTER — Encounter (HOSPITAL_COMMUNITY): Payer: Self-pay | Admitting: *Deleted

## 2017-10-29 DIAGNOSIS — R072 Precordial pain: Secondary | ICD-10-CM

## 2017-10-29 DIAGNOSIS — I249 Acute ischemic heart disease, unspecified: Secondary | ICD-10-CM

## 2017-10-29 DIAGNOSIS — I5030 Unspecified diastolic (congestive) heart failure: Secondary | ICD-10-CM

## 2017-10-29 DIAGNOSIS — I25119 Atherosclerotic heart disease of native coronary artery with unspecified angina pectoris: Secondary | ICD-10-CM

## 2017-10-29 LAB — CBC
HCT: 41 % (ref 39.0–52.0)
HCT: 41 % (ref 39.0–52.0)
HEMOGLOBIN: 14.2 g/dL (ref 13.0–17.0)
Hemoglobin: 13.8 g/dL (ref 13.0–17.0)
MCH: 29.7 pg (ref 26.0–34.0)
MCH: 28.9 pg (ref 26.0–34.0)
MCHC: 34.6 g/dL (ref 30.0–36.0)
MCHC: 33.7 g/dL (ref 30.0–36.0)
MCV: 85.8 fL (ref 78.0–100.0)
MCV: 85.8 fL (ref 78.0–100.0)
PLATELETS: 361 10*3/uL (ref 150–400)
Platelets: 377 K/uL (ref 150–400)
RBC: 4.78 MIL/uL (ref 4.22–5.81)
RBC: 4.78 MIL/uL (ref 4.22–5.81)
RDW: 15.3 % (ref 11.5–15.5)
RDW: 15.5 % (ref 11.5–15.5)
WBC: 8.8 10*3/uL (ref 4.0–10.5)
WBC: 8.1 K/uL (ref 4.0–10.5)

## 2017-10-29 LAB — RENAL FUNCTION PANEL
Albumin: 3.3 g/dL — ABNORMAL LOW (ref 3.5–5.0)
Anion gap: 24 — ABNORMAL HIGH (ref 5–15)
BUN: 88 mg/dL — ABNORMAL HIGH (ref 6–20)
CO2: 19 mmol/L — ABNORMAL LOW (ref 22–32)
Calcium: 8.5 mg/dL — ABNORMAL LOW (ref 8.9–10.3)
Chloride: 87 mmol/L — ABNORMAL LOW (ref 101–111)
Creatinine, Ser: 15.17 mg/dL — ABNORMAL HIGH (ref 0.61–1.24)
GFR calc Af Amer: 4 mL/min — ABNORMAL LOW (ref >60)
GFR calc non Af Amer: 3 mL/min — ABNORMAL LOW (ref >60)
Glucose, Bld: 266 mg/dL — ABNORMAL HIGH (ref 65–99)
Phosphorus: 9.1 mg/dL — ABNORMAL HIGH (ref 2.5–4.6)
Potassium: 4.5 mmol/L (ref 3.5–5.1)
Sodium: 130 mmol/L — ABNORMAL LOW (ref 135–145)

## 2017-10-29 LAB — HEMOGLOBIN A1C
HEMOGLOBIN A1C: 12.1 % — AB (ref 4.8–5.6)
MEAN PLASMA GLUCOSE: 300.57 mg/dL

## 2017-10-29 LAB — CREATININE, SERUM
CREATININE: 13.89 mg/dL — AB (ref 0.61–1.24)
GFR, EST AFRICAN AMERICAN: 4 mL/min — AB (ref >60)
GFR, EST NON AFRICAN AMERICAN: 4 mL/min — AB (ref >60)

## 2017-10-29 LAB — TROPONIN I
TROPONIN I: 0.09 ng/mL — AB (ref <0.03)
Troponin I: 0.1 ng/mL (ref <0.03)

## 2017-10-29 LAB — LIPID PANEL
Cholesterol: 187 mg/dL (ref 0–200)
HDL: 35 mg/dL — AB (ref >40)
LDL CALC: 99 mg/dL (ref 0–99)
Total CHOL/HDL Ratio: 5.3 RATIO
Triglycerides: 266 mg/dL — ABNORMAL HIGH (ref <150)
VLDL: 53 mg/dL — ABNORMAL HIGH (ref 0–40)

## 2017-10-29 LAB — GLUCOSE, CAPILLARY
GLUCOSE-CAPILLARY: 218 mg/dL — AB (ref 65–99)
Glucose-Capillary: 198 mg/dL — ABNORMAL HIGH (ref 65–99)

## 2017-10-29 LAB — HEPARIN LEVEL (UNFRACTIONATED): HEPARIN UNFRACTIONATED: 0.2 [IU]/mL — AB (ref 0.30–0.70)

## 2017-10-29 LAB — TSH: TSH: 1.011 u[IU]/mL (ref 0.350–4.500)

## 2017-10-29 LAB — CBG MONITORING, ED: Glucose-Capillary: 176 mg/dL — ABNORMAL HIGH (ref 65–99)

## 2017-10-29 LAB — HIV ANTIBODY (ROUTINE TESTING W REFLEX): HIV SCREEN 4TH GENERATION: NONREACTIVE

## 2017-10-29 MED ORDER — INSULIN GLARGINE 100 UNIT/ML ~~LOC~~ SOLN
5.0000 [IU] | Freq: Every day | SUBCUTANEOUS | Status: DC
  Administered 2017-10-29: 5 [IU] via SUBCUTANEOUS
  Filled 2017-10-29: qty 0.05

## 2017-10-29 MED ORDER — CALCIUM ACETATE (PHOS BINDER) 667 MG PO CAPS
1334.0000 mg | ORAL_CAPSULE | Freq: Three times a day (TID) | ORAL | Status: DC
  Administered 2017-10-29 – 2017-11-01 (×4): 1334 mg via ORAL
  Filled 2017-10-29 (×6): qty 2

## 2017-10-29 MED ORDER — LAMOTRIGINE 100 MG PO TABS
150.0000 mg | ORAL_TABLET | Freq: Every day | ORAL | Status: DC
  Administered 2017-10-29 – 2017-11-03 (×6): 150 mg via ORAL
  Filled 2017-10-29 (×6): qty 2

## 2017-10-29 MED ORDER — IPRATROPIUM-ALBUTEROL 0.5-2.5 (3) MG/3ML IN SOLN: 3.0000 mL | Freq: Three times a day (TID) | RESPIRATORY_TRACT | Status: DC | PRN

## 2017-10-29 MED ORDER — LANTHANUM CARBONATE 500 MG PO CHEW
1000.0000 mg | CHEWABLE_TABLET | Freq: Three times a day (TID) | ORAL | Status: DC
  Administered 2017-10-29 – 2017-11-03 (×11): 1000 mg via ORAL
  Filled 2017-10-29 (×14): qty 2

## 2017-10-29 MED ORDER — ONDANSETRON HCL 4 MG PO TABS: 4.0000 mg | ORAL_TABLET | Freq: Four times a day (QID) | ORAL | Status: DC | PRN

## 2017-10-29 MED ORDER — DOCUSATE SODIUM 100 MG PO CAPS
100.0000 mg | ORAL_CAPSULE | Freq: Every day | ORAL | Status: DC
  Administered 2017-10-29 – 2017-11-03 (×3): 100 mg via ORAL
  Filled 2017-10-29 (×5): qty 1

## 2017-10-29 MED ORDER — AMLODIPINE BESYLATE 10 MG PO TABS
10.0000 mg | ORAL_TABLET | Freq: Every day | ORAL | Status: DC
  Administered 2017-10-29 – 2017-11-03 (×7): 10 mg via ORAL
  Filled 2017-10-29: qty 1
  Filled 2017-10-29: qty 2
  Filled 2017-10-29 (×5): qty 1

## 2017-10-29 MED ORDER — CLOPIDOGREL BISULFATE 75 MG PO TABS
75.0000 mg | ORAL_TABLET | Freq: Every day | ORAL | Status: DC
  Administered 2017-10-29 – 2017-10-31 (×3): 75 mg via ORAL
  Filled 2017-10-29 (×3): qty 1

## 2017-10-29 MED ORDER — POLYETHYLENE GLYCOL 3350 17 G PO PACK: 17.0000 g | PACK | Freq: Every day | ORAL | Status: DC | PRN

## 2017-10-29 MED ORDER — HEPARIN SODIUM (PORCINE) 1000 UNIT/ML DIALYSIS
2400.0000 [IU] | Freq: Once | INTRAMUSCULAR | Status: DC
  Filled 2017-10-29: qty 3

## 2017-10-29 MED ORDER — LIDOCAINE HCL (PF) 1 % IJ SOLN
5.0000 mL | INTRAMUSCULAR | Status: DC | PRN
Start: 2017-10-29 — End: 2017-10-29

## 2017-10-29 MED ORDER — INSULIN ASPART 100 UNIT/ML ~~LOC~~ SOLN
0.0000 [IU] | Freq: Three times a day (TID) | SUBCUTANEOUS | Status: DC
  Administered 2017-10-29: 2 [IU] via SUBCUTANEOUS
  Administered 2017-10-29: 3 [IU] via SUBCUTANEOUS
  Administered 2017-10-30: 2 [IU] via SUBCUTANEOUS
  Administered 2017-10-30: 1 [IU] via SUBCUTANEOUS
  Administered 2017-10-30: 3 [IU] via SUBCUTANEOUS
  Administered 2017-10-31: 1 [IU] via SUBCUTANEOUS
  Administered 2017-10-31: 2 [IU] via SUBCUTANEOUS
  Administered 2017-11-01: 3 [IU] via SUBCUTANEOUS
  Administered 2017-11-01: 1 [IU] via SUBCUTANEOUS
  Administered 2017-11-01 – 2017-11-02 (×3): 3 [IU] via SUBCUTANEOUS
  Administered 2017-11-03 (×2): 2 [IU] via SUBCUTANEOUS

## 2017-10-29 MED ORDER — HEPARIN SODIUM (PORCINE) 5000 UNIT/ML IJ SOLN: 5000.0000 [IU] | Freq: Three times a day (TID) | INTRAMUSCULAR | Status: DC

## 2017-10-29 MED ORDER — PENTAFLUOROPROP-TETRAFLUOROETH EX AERO: 1.0000 "application " | INHALATION_SPRAY | CUTANEOUS | Status: DC | PRN

## 2017-10-29 MED ORDER — METHOCARBAMOL 500 MG PO TABS: 500.0000 mg | ORAL_TABLET | Freq: Four times a day (QID) | ORAL | Status: DC | PRN

## 2017-10-29 MED ORDER — SODIUM CHLORIDE 0.9 % IV SOLN: 100.0000 mL | INTRAVENOUS | Status: DC | PRN

## 2017-10-29 MED ORDER — CLOPIDOGREL BISULFATE 75 MG PO TABS: 75.0000 mg | ORAL_TABLET | Freq: Every day | ORAL | Status: DC

## 2017-10-29 MED ORDER — PRO-STAT SUGAR FREE PO LIQD
30.0000 mL | Freq: Two times a day (BID) | ORAL | Status: DC
  Filled 2017-10-29 (×5): qty 30

## 2017-10-29 MED ORDER — SODIUM CHLORIDE 0.9% FLUSH
3.0000 mL | Freq: Two times a day (BID) | INTRAVENOUS | Status: DC
  Administered 2017-10-29 – 2017-11-01 (×4): 3 mL via INTRAVENOUS

## 2017-10-29 MED ORDER — NITROGLYCERIN 0.4 MG/HR TD PT24
0.4000 mg | MEDICATED_PATCH | Freq: Every day | TRANSDERMAL | Status: DC
  Administered 2017-10-29 – 2017-11-02 (×4): 0.4 mg via TRANSDERMAL
  Filled 2017-10-29 (×6): qty 1

## 2017-10-29 MED ORDER — ONDANSETRON HCL 4 MG/2ML IJ SOLN
4.0000 mg | Freq: Four times a day (QID) | INTRAMUSCULAR | Status: DC | PRN
  Administered 2017-10-29: 4 mg via INTRAVENOUS
  Filled 2017-10-29: qty 2

## 2017-10-29 MED ORDER — ATORVASTATIN CALCIUM 40 MG PO TABS
40.0000 mg | ORAL_TABLET | Freq: Every day | ORAL | Status: DC
  Administered 2017-10-29 – 2017-10-30 (×2): 40 mg via ORAL
  Filled 2017-10-29 (×3): qty 1

## 2017-10-29 MED ORDER — NITROGLYCERIN 0.4 MG SL SUBL
0.4000 mg | SUBLINGUAL_TABLET | SUBLINGUAL | Status: DC | PRN
  Administered 2017-10-29 – 2017-11-03 (×10): 0.4 mg via SUBLINGUAL
  Filled 2017-10-29 (×3): qty 1

## 2017-10-29 MED ORDER — ACETAMINOPHEN 500 MG PO TABS: 1000.0000 mg | ORAL_TABLET | Freq: Four times a day (QID) | ORAL | Status: DC | PRN

## 2017-10-29 MED ORDER — HEPARIN BOLUS VIA INFUSION
4000.0000 [IU] | Freq: Once | INTRAVENOUS | Status: AC
  Administered 2017-10-29: 4000 [IU] via INTRAVENOUS
  Filled 2017-10-29: qty 4000

## 2017-10-29 MED ORDER — ACETAMINOPHEN 325 MG PO TABS
650.0000 mg | ORAL_TABLET | ORAL | Status: DC | PRN
Start: 2017-10-29 — End: 2017-10-31

## 2017-10-29 MED ORDER — LANTHANUM CARBONATE 500 MG PO CHEW: 2000.0000 mg | CHEWABLE_TABLET | Freq: Three times a day (TID) | ORAL | Status: DC

## 2017-10-29 MED ORDER — CALCITRIOL 0.5 MCG PO CAPS: 2.2500 ug | ORAL_CAPSULE | ORAL | Status: DC

## 2017-10-29 MED ORDER — HEPARIN (PORCINE) IN NACL 100-0.45 UNIT/ML-% IJ SOLN
1200.0000 [IU]/h | INTRAMUSCULAR | Status: DC
  Administered 2017-10-29: 1000 [IU]/h via INTRAVENOUS
  Administered 2017-10-29: 1200 [IU]/h via INTRAVENOUS
  Filled 2017-10-29 (×3): qty 250

## 2017-10-29 MED ORDER — LISINOPRIL 2.5 MG PO TABS
2.5000 mg | ORAL_TABLET | Freq: Every day | ORAL | Status: DC
  Administered 2017-10-30 – 2017-11-03 (×4): 2.5 mg via ORAL
  Filled 2017-10-29 (×4): qty 1

## 2017-10-29 MED ORDER — CALCITRIOL 0.25 MCG PO CAPS
1.2500 ug | ORAL_CAPSULE | ORAL | Status: DC
  Administered 2017-11-08: 1.25 ug via ORAL

## 2017-10-29 MED ORDER — PANTOPRAZOLE SODIUM 40 MG PO TBEC: 40.0000 mg | DELAYED_RELEASE_TABLET | Freq: Every day | ORAL | Status: DC

## 2017-10-29 MED ORDER — NICOTINE 14 MG/24HR TD PT24
14.0000 mg | MEDICATED_PATCH | Freq: Every day | TRANSDERMAL | Status: DC
  Administered 2017-10-29 – 2017-11-03 (×6): 14 mg via TRANSDERMAL
  Filled 2017-10-29 (×5): qty 1

## 2017-10-29 MED ORDER — CARVEDILOL 3.125 MG PO TABS
3.1250 mg | ORAL_TABLET | Freq: Two times a day (BID) | ORAL | Status: DC
  Administered 2017-10-29 – 2017-11-03 (×11): 3.125 mg via ORAL
  Filled 2017-10-29 (×11): qty 1

## 2017-10-29 MED ORDER — METOPROLOL TARTRATE 25 MG PO TABS: 25.0000 mg | ORAL_TABLET | Freq: Two times a day (BID) | ORAL | Status: DC

## 2017-10-29 MED ORDER — LIDOCAINE-PRILOCAINE 2.5-2.5 % EX CREA
1.0000 "application " | TOPICAL_CREAM | CUTANEOUS | Status: DC | PRN
  Filled 2017-10-29: qty 5

## 2017-10-29 NOTE — ED Notes (Signed)
Called for Lipitor

## 2017-10-29 NOTE — Progress Notes (Signed)
Progress for Heparin  Indication: chest pain/ACS  Allergies  Allergen Reactions  . Coconut Oil Anaphylaxis    Can use topically, allergic to coconut foods    Patient Measurements: Height: 6\' 2"  (188 cm) Weight: 177 lb 7.5 oz (80.5 kg) IBW/kg (Calculated) : 82.2  Vital Signs: Temp: 98.9 F (37.2 C) (03/16 1543) BP: 106/65 (03/16 1200) Pulse Rate: 60 (03/16 1200)  Labs: Recent Labs    10/28/17 1530 10/28/17 2220 10/29/17 0239 10/29/17 0240 10/29/17 1120 10/29/17 1538  HGB 14.0  --   --   --  14.2  --   HCT 41.8  --   --   --  41.0  --   PLT 350  --   --   --  361  --   HEPARINUNFRC  --   --   --   --   --  0.20*  CREATININE 12.66*  --  13.89*  --   --   --   TROPONINI  --  0.10*  --  0.10* 0.09*  --    Estimated Creatinine Clearance: 8 mL/min (A) (by C-G formula based on SCr of 13.89 mg/dL (H)).  Medical History: Past Medical History:  Diagnosis Date  . Anemia   . Atherosclerosis of lower extremity ([place])   . CAD (coronary artery disease)   . Chronic combined systolic and diastolic heart failure ([place])   . Complication of anesthesia   . ESRD (end stage renal disease) on dialysis [facility])    "MWF [employer] [employer]" (03/08/2017)  . GERD (gastroesophageal reflux disease)   . Heart murmur   . History of blood transfusion    "related to OR"  . Hypertension   . PONV (postoperative nausea and vomiting)   . Type II diabetes mellitus ([facility])    Assessment: 42 y/o M to start heparin for CP, mildly elevated troponin, CBC good, ESRD on HD, PTA meds reviewed. Cardiology planning for cath on Monday 10/31/17.  Initial heparin level low: 0.20, no infusion related issues or overt bleeding reported; will increase rate ~2.5 units/kg/hour  Goal of Therapy:  Heparin level 0.3-0.7 units/ml Monitor platelets by anticoagulation protocol: Yes   Plan:  Increase heparin drip to 1200 units/hr Daily CBC/heparin level Monitor for bleeding  [name]  [name] 10/29/2017,5:00 PM

## 2017-10-29 NOTE — ED Notes (Signed)
Denies chest pain 

## 2017-10-29 NOTE — ED Notes (Signed)
Pt. Sleeping, breakfast at the bedside.

## 2017-10-29 NOTE — Consult Note (Signed)
[facility] Renal Consultation Note    Indication for Consultation:  Management of ESRD/hemodialysis; anemia, hypertension/volume and secondary hyperparathyroidism PCP:  HPI: [name] Labella Sr. is a 42 y.o. male with ESRD on hemodialysis MWF at [facility]. PMH significant for DMT2, hypertension, CAD, combined systolic and diastolic dysfunction (Last EF 50-55% 09/15/14) medical non-compliance.   Patient states he missed HD 10/28/2017 to go to housing authority. Developed midsternal chest pain yesterday rated pain 10/10 with radiation down R. Arm with associated nausea, vomiting and SOB. Apparently has been having intermittent chest pain for over 1 week. Upon arrival to ED, he had infero-lateral ST depression on EKG, troponin peak 0.7 now flat trend at .10. CXR unremarkable. K+4.0 Scr 12.66 BUN 68 BS 322. D Dimer negative. Cardiology has been consulted. He has been started on heparin gtt.  Currently seen in HD, he appears sleeping, is not C/O chest pain at present. SR on monitor. BP controlled. Says he had to get his housing situation "straightened out". Currently he is lying flat in bed without C/Os of SOB. Says he has been having intermittent exertional chest for past three weeks usually relieved with rest. He has attended all HD sessions, stayed full treatments last 5 treatments. Has not gotten to OP EDW in past 5 tx. EDW raised 0.5 kg last treatment.   Past Medical History:  Diagnosis Date  . Anemia   . Atherosclerosis of lower extremity ([place])   . CAD (coronary artery disease)   . Chronic combined systolic and diastolic heart failure ([place])   . Complication of anesthesia   . ESRD (end stage renal disease) on dialysis [facility])    "MWF [employer]" (03/08/2017)  . GERD (gastroesophageal reflux disease)   . Heart murmur   . History of blood transfusion    "related to OR"  . Hypertension   . PONV (postoperative nausea and vomiting)   . Type II diabetes  mellitus ([place])    Past Surgical History:  Procedure Laterality Date  . ABDOMINAL AORTOGRAM N/A 11/02/2016   Procedure: Abdominal Aortogram;  Surgeon: Waynetta Sandy, MD;  Location: [place] CV [facility];  Service: Cardiovascular;  Laterality: N/A;  . AMPUTATION Left 09/27/2013   Procedure: LEFT GREAT TOE AMPUTATION;  Surgeon: [name], MD;  Location: [place];  Service: Orthopedics;  Laterality: Left;  . AMPUTATION Right 08/15/2015   Procedure: Right Great Toe Amputation;  Surgeon: [name], MD;  Location: [place];  Service: Orthopedics;  Laterality: Right;  . AMPUTATION Left 11/05/2016   Procedure: TRANSMETATARSAL AMPUTATION LEFT FOOT;  Surgeon: [name], MD;  Location: [place];  Service: Orthopedics;  Laterality: Left;  . AMPUTATION Left 03/11/2017   Procedure: LEFT BELOW KNEE AMPUTATION;  Surgeon: [name], MD;  Location: [place];  Service: Orthopedics;  Laterality: Left;  . AMPUTATION Right 03/11/2017   Procedure: RIGHT 2ND TOE AMPUTATION;  Surgeon: [name], MD;  Location: [place];  Service: Orthopedics;  Laterality: Right;  . AV FISTULA PLACEMENT  left arm  . LEFT HEART CATHETERIZATION WITH CORONARY ANGIOGRAM N/A 09/13/2014   Procedure: LEFT HEART CATHETERIZATION WITH CORONARY ANGIOGRAM;  Surgeon: Sinclair Grooms, MD;  Location: [facility] CATH LAB;  Service: Cardiovascular;  Laterality: N/A;  . LOWER EXTREMITY ANGIOGRAPHY Bilateral 11/02/2016   Procedure: Lower Extremity Angiography;  Surgeon: Waynetta Sandy, MD;  Location: [place] CV [facility];  Service: Cardiovascular;  Laterality: Bilateral;  . PERIPHERAL VASCULAR ATHERECTOMY Left 11/02/2016   Procedure: Peripheral  Vascular Atherectomy;  Surgeon: Waynetta Sandy, MD;  [place] [facility];  Service: Cardiovascular;  Laterality: Left;  PERONEAL  . STUMP REVISION Left 01/11/2017   Procedure: Revision Left Transmetatarsal Amputation;  Surgeon: [name], MD;  Location: [place];  Service: Orthopedics;   Laterality: Left;   Family History  Problem Relation Age of Onset  . Heart failure Mother   . Hypertension Mother    Social History:  reports that he has been smoking cigarettes.  He has a 13.00 pack-year smoking history. he has never used smokeless tobacco. He reports that he drinks alcohol. He reports that he uses drugs. Drug: Marijuana. Allergies  Allergen Reactions  . Coconut Oil Anaphylaxis    Can use topically, allergic to coconut foods    Prior to Admission medications   Medication Sig Start Date End Date Taking? Authorizing Provider  acetaminophen (TYLENOL) 500 MG tablet Take 1,000 mg by mouth every 6 (six) hours as needed (for mild pain and CANNOT EXCEED 4,000 MG/24 HOURS).    Yes [provider]  amLODipine (NORVASC) 10 MG tablet Take 1 tablet (10 mg total) by mouth at bedtime. 08/16/15  Yes Short, Noah Delaine, MD  B Complex-C-Folic Acid (NEPHRO-VITE PO) Take 1 tablet by mouth at bedtime.   Yes [provider]  calcitRIOL (ROCALTROL) 0.25 MCG capsule Take 9 capsules (2.25 mcg total) by mouth every Monday, Wednesday, and Friday with hemodialysis. 03/18/17  Yes Rai, Ripudeep K, MD  calcium acetate (PHOSLO) 667 MG capsule Take 3 capsules (2,001 mg total) by mouth 3 (three) times daily with meals. Patient taking differently: Take 1,334 mg by mouth 3 (three) times daily with meals.  03/17/17  Yes Rai, Ripudeep K, MD  clopidogrel (PLAVIX) 75 MG tablet Take 1 tablet (75 mg total) by mouth daily. 11/02/16  Yes Waynetta Sandy, MD  docusate sodium (COLACE) 100 MG capsule Take 1 capsule (100 mg total) by mouth 2 (two) times daily. Patient taking differently: Take 100 mg by mouth at bedtime.  03/17/17  Yes Rai, Ripudeep K, MD  furosemide (LASIX) 20 MG tablet Take 20 mg by mouth 2 (two) times daily.   Yes [provider]  glucagon (GLUCAGON EMERGENCY) 1 MG injection Inject 1 mg into the vein once as needed (low blood sugar and inability to eat or drink sugar).  08/16/15  Yes Short, Noah Delaine, MD  insulin aspart (NOVOLOG) 100 UNIT/ML injection Sliding scale CBG 70 - 120: 0 units CBG 121 - 150: 1 unit,  CBG 151 - 200: 2 units,  CBG 201 - 250: 3 units,  CBG 251 - 300: 5 units,  CBG 301 - 350: 7 units,  CBG 351 - 400: 9 units   CBG > 400: 9 units and notify your MD Patient taking differently: Inject 0-9 Units into the skin See admin instructions. Inject 0-9 units into the skin two times a day per sliding scale: BGL 70-120 = 0 units; 121-150 = 1 unit; 151-200 = 2 units; 201-250 = 3 units; 251-300 = 5 units; 301-350 = 7 units; 351-400 = 9 units; >400 = 9 units and call MD 03/17/17  Yes Rai, Ripudeep K, MD  insulin glargine (LANTUS) 100 UNIT/ML injection Inject 0.07 mLs (7 Units total) into the skin at bedtime. 03/17/17  Yes Rai, Ripudeep K, MD  ipratropium-albuterol (DUONEB) 0.5-2.5 (3) MG/3ML SOLN Take 3 mLs by nebulization 3 (three) times daily as needed (for shortness of breath).    Yes [provider]  lamoTRIgine (LAMICTAL)  150 MG tablet Take 150 mg by mouth at bedtime.   Yes [provider]  lanthanum (FOSRENOL) 1000 MG chewable tablet Chew 2 tablets (2,000 mg total) by mouth 3 (three) times daily with meals. 08/16/15  Yes Short, Noah Delaine, MD  methocarbamol (ROBAXIN) 500 MG tablet Take 1 tablet (500 mg total) by mouth every 6 (six) hours as needed for muscle spasms. 03/17/17  Yes Rai, Ripudeep K, MD  Nutritional Supplements (BOOST GLUCOSE CONTROL) LIQD Take 8 oz by mouth as directed.   Yes [provider]  ondansetron (ZOFRAN) 4 MG tablet Take 4 mg by mouth every 6 (six) hours as needed for nausea.    Yes [provider]  polyethylene glycol (MIRALAX / GLYCOLAX) packet Take 17 g by mouth daily. Patient taking differently: Take 17 g by mouth daily as needed for mild constipation.  03/17/17  Yes Rai, Ripudeep K, MD  Amino Acids-Protein Hydrolys (FEEDING SUPPLEMENT, PRO-STAT SUGAR FREE 64,) LIQD Take 30 mLs by mouth 2 (two) times  daily. Patient not taking: Reported on 10/28/2017 03/17/17   Mendel Corning, MD  carvedilol (COREG) 6.25 MG tablet Take 1 tablet (6.25 mg total) by mouth 2 (two) times daily with a meal. Patient not taking: Reported on 10/28/2017 03/17/17   Rai, Vernelle Emerald, MD  fentaNYL (DURAGESIC - DOSED MCG/HR) 12 MCG/HR Place 1 patch (12.5 mcg total) onto the skin every 3 (three) days. Patient not taking: Reported on 10/28/2017 03/18/17   Mendel Corning, MD  multivitamin (RENA-VIT) TABS tablet Take 1 tablet by mouth at bedtime. Patient not taking: Reported on 10/28/2017 03/17/17   Rai, Vernelle Emerald, MD  nicotine (NICODERM CQ - DOSED IN MG/24 HOURS) 21 mg/24hr patch Place 1 patch (21 mg total) onto the skin daily. Patient not taking: Reported on 10/28/2017 03/17/17   Rai, Vernelle Emerald, MD  oxyCODONE (OXY IR/ROXICODONE) 5 MG immediate release tablet Take 1-2 tablets (5-10 mg total) by mouth every 4 (four) hours as needed for breakthrough pain. Patient not taking: Reported on 10/28/2017 03/17/17   Rai, Vernelle Emerald, MD  lisinopril (PRINIVIL,ZESTRIL) 10 MG tablet Take 1 tablet (10 mg total) by mouth daily. Patient not taking: Reported on 04/24/2015 09/15/14 04/24/15  Francesca Oman, DO  metoprolol tartrate (LOPRESSOR) 25 MG tablet Take 1 tablet (25 mg total) by mouth 2 (two) times daily. Patient not taking: Reported on 04/24/2015 09/15/14 04/24/15  Francesca Oman, DO  pantoprazole (PROTONIX) 40 MG tablet Take 1 tablet (40 mg total) by mouth daily. Patient not taking: Reported on 04/24/2015 09/15/14 04/24/15  Francesca Oman, DO   Current Facility-Administered Medications  Medication Dose Route Frequency Provider Last Rate Last Dose  . acetaminophen (TYLENOL) tablet 650 mg  650 mg Oral Q4H PRN Sherene Sires, DO      . amLODipine (NORVASC) tablet 10 mg  10 mg Oral QHS Bland, Scott, DO      . aspirin chewable tablet 324 mg  324 mg Oral Once Pisciotta, Nicole, PA-C      . atorvastatin (LIPITOR) tablet 40 mg  40 mg Oral q1800 Mikell, Jeani Sow, MD       . calcitRIOL (ROCALTROL) capsule 1.25 mcg  1.25 mcg Oral Q M,W,F-HD Valentina Gu, NP      . Derrill Memo ON 10/31/2017] calcitRIOL (ROCALTROL) capsule 2.25 mcg  2.25 mcg Oral Q M,W,F-HD Criss Rosales, Scott, DO      . calcium acetate (PHOSLO) capsule 1,334 mg  1,334 mg Oral TID WC Sherene Sires, DO  1,334 mg at 10/29/17 0906  . clopidogrel (PLAVIX) tablet 75 mg  75 mg Oral Daily Mikell, Jeani Sow, MD      . docusate sodium (COLACE) capsule 100 mg  100 mg Oral QHS Bland, Scott, DO      . feeding supplement (PRO-STAT SUGAR FREE 64) liquid 30 mL  30 mL Oral BID Bland, Scott, DO      . heparin ADULT infusion 100 units/mL (25000 units/214mL sodium chloride 0.45%)  1,000 Units/hr Intravenous Continuous Erenest Blank, RPH 10 mL/hr at 10/29/17 0325 1,000 Units/hr at 10/29/17 0325  . insulin aspart (novoLOG) injection 0-9 Units  0-9 Units Subcutaneous TID WC Bland, Scott, DO   2 Units at 10/29/17 0924  . ipratropium-albuterol (DUONEB) 0.5-2.5 (3) MG/3ML nebulizer solution 3 mL  3 mL Nebulization TID PRN Sherene Sires, DO      . lamoTRIgine (LAMICTAL) tablet 150 mg  150 mg Oral QHS Bland, Scott, DO      . lanthanum (FOSRENOL) chewable tablet 1,000 mg  1,000 mg Oral TID WC Mikell, Jeani Sow, MD   1,000 mg at 10/29/17 0906  . methocarbamol (ROBAXIN) tablet 500 mg  500 mg Oral Q6H PRN Sherene Sires, DO      . nicotine (NICODERM CQ - dosed in mg/24 hours) patch 14 mg  14 mg Transdermal Daily Bland, Scott, DO      . nitroGLYCERIN (NITRODUR - Dosed in mg/24 hr) patch 0.4 mg  0.4 mg Transdermal Daily Bland, Scott, DO   0.4 mg at 10/29/17 0444  . nitroGLYCERIN (NITROSTAT) SL tablet 0.4 mg  0.4 mg Sublingual Q5 min PRN Sherene Sires, DO   0.4 mg at 10/29/17 0220  . ondansetron (ZOFRAN) injection 4 mg  4 mg Intravenous Q6H PRN Sherene Sires, DO   4 mg at 10/29/17 0210  . ondansetron (ZOFRAN) tablet 4 mg  4 mg Oral Q6H PRN Sherene Sires, DO      . polyethylene glycol (MIRALAX / GLYCOLAX) packet 17 g  17 g Oral Daily PRN  Criss Rosales, Scott, DO      . sodium chloride flush (NS) 0.9 % injection 3 mL  3 mL Intravenous Q12H Bland, Scott, DO   3 mL at 10/29/17 7412   Current Outpatient Medications  Medication Sig Dispense Refill  . acetaminophen (TYLENOL) 500 MG tablet Take 1,000 mg by mouth every 6 (six) hours as needed (for mild pain and CANNOT EXCEED 4,000 MG/24 HOURS).     Marland Kitchen amLODipine (NORVASC) 10 MG tablet Take 1 tablet (10 mg total) by mouth at bedtime. 30 tablet 0  . B Complex-C-Folic Acid (NEPHRO-VITE PO) Take 1 tablet by mouth at bedtime.    . calcitRIOL (ROCALTROL) 0.25 MCG capsule Take 9 capsules (2.25 mcg total) by mouth every Monday, Wednesday, and Friday with hemodialysis.    Marland Kitchen calcium acetate (PHOSLO) 667 MG capsule Take 3 capsules (2,001 mg total) by mouth 3 (three) times daily with meals. (Patient taking differently: Take 1,334 mg by mouth 3 (three) times daily with meals. )    . clopidogrel (PLAVIX) 75 MG tablet Take 1 tablet (75 mg total) by mouth daily. 30 tablet 3  . docusate sodium (COLACE) 100 MG capsule Take 1 capsule (100 mg total) by mouth 2 (two) times daily. (Patient taking differently: Take 100 mg by mouth at bedtime. ) 10 capsule 0  . furosemide (LASIX) 20 MG tablet Take 20 mg by mouth 2 (two) times daily.    Marland Kitchen glucagon (GLUCAGON EMERGENCY) 1 MG injection Inject  1 mg into the vein once as needed (low blood sugar and inability to eat or drink sugar). 1 each 0  . insulin aspart (NOVOLOG) 100 UNIT/ML injection Sliding scale CBG 70 - 120: 0 units CBG 121 - 150: 1 unit,  CBG 151 - 200: 2 units,  CBG 201 - 250: 3 units,  CBG 251 - 300: 5 units,  CBG 301 - 350: 7 units,  CBG 351 - 400: 9 units   CBG > 400: 9 units and notify your MD (Patient taking differently: Inject 0-9 Units into the skin See admin instructions. Inject 0-9 units into the skin two times a day per sliding scale: BGL 70-120 = 0 units; 121-150 = 1 unit; 151-200 = 2 units; 201-250 = 3 units; 251-300 = 5 units; 301-350 = 7 units; 351-400 = 9  units; >400 = 9 units and call MD) 10 mL 11  . insulin glargine (LANTUS) 100 UNIT/ML injection Inject 0.07 mLs (7 Units total) into the skin at bedtime. 10 mL 12  . ipratropium-albuterol (DUONEB) 0.5-2.5 (3) MG/3ML SOLN Take 3 mLs by nebulization 3 (three) times daily as needed (for shortness of breath).     . lamoTRIgine (LAMICTAL) 150 MG tablet Take 150 mg by mouth at bedtime.    Marland Kitchen lanthanum (FOSRENOL) 1000 MG chewable tablet Chew 2 tablets (2,000 mg total) by mouth 3 (three) times daily with meals. 180 tablet 0  . methocarbamol (ROBAXIN) 500 MG tablet Take 1 tablet (500 mg total) by mouth every 6 (six) hours as needed for muscle spasms.    . Nutritional Supplements (BOOST GLUCOSE CONTROL) LIQD Take 8 oz by mouth as directed.    . ondansetron (ZOFRAN) 4 MG tablet Take 4 mg by mouth every 6 (six) hours as needed for nausea.     . polyethylene glycol (MIRALAX / GLYCOLAX) packet Take 17 g by mouth daily. (Patient taking differently: Take 17 g by mouth daily as needed for mild constipation. ) 14 each 0  . Amino Acids-Protein Hydrolys (FEEDING SUPPLEMENT, PRO-STAT SUGAR FREE 64,) LIQD Take 30 mLs by mouth 2 (two) times daily. (Patient not taking: Reported on 10/28/2017) 900 mL 0  . carvedilol (COREG) 6.25 MG tablet Take 1 tablet (6.25 mg total) by mouth 2 (two) times daily with a meal. (Patient not taking: Reported on 10/28/2017)    . fentaNYL (DURAGESIC - DOSED MCG/HR) 12 MCG/HR Place 1 patch (12.5 mcg total) onto the skin every 3 (three) days. (Patient not taking: Reported on 10/28/2017) 5 patch 0  . multivitamin (RENA-VIT) TABS tablet Take 1 tablet by mouth at bedtime. (Patient not taking: Reported on 10/28/2017)  0  . nicotine (NICODERM CQ - DOSED IN MG/24 HOURS) 21 mg/24hr patch Place 1 patch (21 mg total) onto the skin daily. (Patient not taking: Reported on 10/28/2017) 28 patch 0  . oxyCODONE (OXY IR/ROXICODONE) 5 MG immediate release tablet Take 1-2 tablets (5-10 mg total) by mouth every 4 (four) hours  as needed for breakthrough pain. (Patient not taking: Reported on 10/28/2017) 20 tablet 0   Labs: Basic Metabolic Panel: Recent Labs  Lab 10/28/17 1530 10/29/17 0239  NA 131*  --   K 4.0  --   CL 90*  --   CO2 22  --   GLUCOSE 322*  --   BUN 68*  --   CREATININE 12.66* 13.89*  CALCIUM 9.0  --    Liver Function Tests: Recent Labs  Lab 10/28/17 1529  AST 18  ALT 19  ALKPHOS 98  BILITOT 0.7  PROT 7.1  ALBUMIN 3.5   No results for input(s): LIPASE, AMYLASE in the last 168 hours. No results for input(s): AMMONIA in the last 168 hours. CBC: Recent Labs  Lab 10/28/17 1530  WBC 7.4  NEUTROABS 4.9  HGB 14.0  HCT 41.8  MCV 86.9  PLT 350   Cardiac Enzymes: Recent Labs  Lab 10/28/17 2220 10/29/17 0240  TROPONINI 0.10* 0.10*   CBG: Recent Labs  Lab 10/29/17 0903  GLUCAP 176*   Iron Studies: No results for input(s): IRON, TIBC, TRANSFERRIN, FERRITIN in the last 72 hours. Studies/Results: Dg Chest 2 View  Result Date: 10/28/2017 CLINICAL DATA:  Hervey Ard right-sided chest pain. EXAM: CHEST - 2 VIEW COMPARISON:  06/04/2017 and 06/10/2015 FINDINGS: The heart size and pulmonary vascularity are normal. Lungs are clear except for slight thickening of the fissures which is chronic. No effusions. Slight calcification in the arch of the aorta. No bone abnormality. IMPRESSION: No active cardiopulmonary disease. Electronically Signed   By: Lorriane Shire M.D.   On: 10/28/2017 16:22    ROS: As per HPI otherwise negative.   Physical Exam: Vitals:   10/29/17 0241 10/29/17 0400 10/29/17 0618 10/29/17 0728  BP: 128/74 120/62 (!) 129/117   Pulse: 72 82 85   Resp:   16   Temp:      TempSrc:      SpO2: 99% 98% 97% 98%  Weight:      Height:         General: Chronically ill appearing male in no acute distress. Head: Normocephalic, atraumatic, sclera non-icteric, mucus membranes are moist Neck: Supple. JVD not elevated. Lungs: Bilateral breath sounds with few bibasilar crackles,  no wheezing. Breathing is unlabored. Heart: RRR with S1 S2. 2/6 systolic M. No R/G Abdomen: Soft, non-tender, non-distended with normoactive bowel sounds. No rebound/guarding. No obvious abdominal masses. M-S:  Strength and tone appear normal for age. Lower extremities: L BKA No stump edema. No RLE edema.  Neuro: Alert and oriented X 3. Moves all extremities spontaneously. Psych:  Responds to questions appropriately with a normal affect. Dialysis Access: LUA AVF + bruit  Dialysis Orders: Center: [facility] MWF 4 hrs 200 NRe 500/800 73 kg 2.0K/2.0 Ca UFP 2 -Heparin 2400 unit IV TIW -Calcitriol 1.25 mcg PO TIW -Venofer 50 mg IV weekly (last dose 10/26/17)   Assessment/Plan: 1.  Chest pain: ASC vs demand ischemia.Troponin flat trend. ST seg depression noted on EKG inferior laterally. Cardiology consulted. Per primary 2.  HFrEF: Not overtly volume overloaded-appears euvolemic. CXR WNL. BP Controlled in ED. HD today on off schedule. Per primary/Cards. 3.  ESRD -  MWF. Missed HD 03/15. HD today off schedule. Last K+ 4.0 repeat labs in HD.  4.  Hypertension/volume  -Has not been getting to EDW as OP. BP dropping. EDW recently increased. Attempt to get to OP EDW 73 kg in HD today. Actually may need to raise more if he continues to develop hypotension. Does NOT appear volume overloaded.  5.  Anemia  - HGB 14. No ESA.  6.  Metabolic bone disease -  Recheck labs in HD today. Contiue VDRA/binders 7.  Nutrition - Renal/Carb Mod diet, renal vit, nepro 8. DM per primary  Sheralyn Pinegar H. Owens Shark, NP-C 10/29/2017, 9:25 AM  [place]

## 2017-10-29 NOTE — Consult Note (Signed)
Cardiology Consultation:   Patient ID: [name] Folks Sr.; [id number]; [birth year]   Admit date: 10/28/2017 Date of Consult: 10/29/2017  Primary Care Provider: Patient, [name] Per Primary Cardiologist: Johnsie Cancel   Patient Profile:   [name] Goodall Sr. is a 42 y.o. male with a hx of DM, ESRD on HD, CAD, HFrEF with improved LVEF, HTN, who is being seen today for the evaluation of chest pain and elevated troponin at the request of Dr. Criss Rosales.  History of Present Illness:   [name] Pekala Sr. is a 42 y.o. male with a hx of DM, ESRD on HD, CAD, HTN, who is being seen today for the evaluation of chest pain and elevated troponin at the request of Dr. Criss Rosales.  The patient has DM and ESRD for which he has experienced vascular sequelae including LE amputation. He has also had hospitalizations for hypertensive emergency and volume overload/flash pulmonary edema. He has previously been evaluated by Dr. Johnsie Cancel for HFrEF (EF 35-40%), although a more recent echocardiogram showed EF improved to 50-55%. He has also undergone prior LHC that showed lesions including 60% LAD and 90% OM2 stenoses.   [name] was admitted to the family medicine service yesteterday with chest pain and dyspnea. His initial troponin was negative and ECG showed TWI in inferolateral leads. His chest pain acutely worsened when he was sleeping this evening, and repeat ECG showed Increased ST depressions inferior and lateral leads. Repeat troponin was elevated at 0.1. He was started on a heparin infusion, and cardiology was consulted.  On my evaluation, the patient is sleeping in bed. Upon waking, he reports that his chest pain has improved. He states that he has had intermittent chest discomfort over the past several weeks. This pain is a burning sensation in his chest that is sometimes worse with exertion. He also thinks that it is worse when his blood pressure is high. He has some associated dyspnea but denies any edema  in his leg. He denies other complaints. He states that he missed his last HD session but generally does not miss dialysis.   Past Medical History:  Diagnosis Date  . Anemia   . Atherosclerosis of lower extremity ([place])   . CAD (coronary artery disease)   . Chronic combined systolic and diastolic heart failure ([place])   . Complication of anesthesia   . ESRD (end stage renal disease) on dialysis [facility])    "MWF [facility]" (03/08/2017)  . GERD (gastroesophageal reflux disease)   . Heart murmur   . History of blood transfusion    "related to OR"  . Hypertension   . PONV (postoperative nausea and vomiting)   . Type II diabetes mellitus ([place])     Past Surgical History:  Procedure Laterality Date  . ABDOMINAL AORTOGRAM N/A 11/02/2016   Procedure: Abdominal Aortogram;  Surgeon: Waynetta Sandy, MD;  Location: [place] CV LAB;  Service: Cardiovascular;  Laterality: N/A;  . AMPUTATION Left 09/27/2013   Procedure: LEFT GREAT TOE AMPUTATION;  Surgeon: [name], MD;  Location: [place];  Service: Orthopedics;  Laterality: Left;  . AMPUTATION Right 08/15/2015   Procedure: Right Great Toe Amputation;  Surgeon: [name], MD;  Location: [place];  Service: Orthopedics;  Laterality: Right;  . AMPUTATION Left 11/05/2016   Procedure: TRANSMETATARSAL AMPUTATION LEFT FOOT;  Surgeon: [name], MD;  Location: [place];  Service: Orthopedics;  Laterality: Left;  . AMPUTATION Left 03/11/2017   Procedure: LEFT BELOW KNEE AMPUTATION;  Surgeon:  [name], MD;  Location: [place];  Service: Orthopedics;  Laterality: Left;  . AMPUTATION Right 03/11/2017   Procedure: RIGHT 2ND TOE AMPUTATION;  Surgeon: [name], MD;  Location: [place];  Service: Orthopedics;  Laterality: Right;  . AV FISTULA PLACEMENT  left arm  . LEFT HEART CATHETERIZATION WITH CORONARY ANGIOGRAM N/A 09/13/2014   Procedure: LEFT HEART CATHETERIZATION WITH CORONARY ANGIOGRAM;  Surgeon: Sinclair Grooms, MD;  Location: [facility] C[facility]  LAB;  Service: Cardiovascular;  Laterality: N/A;  . LOWER EXTREMITY ANGIOGRAPHY Bilateral 11/02/2016   Procedure: Lower Extremity Angiography;  Surgeon: Waynetta Sandy, MD;  Location: [place] CV LAB;  Service: Cardiovascular;  Laterality: Bilateral;  . PERIPHERAL VASCULAR ATHERECTOMY Left 11/02/2016   Procedure: Peripheral Vascular Atherectomy;  Surgeon: Waynetta Sandy, MD;  Location: [place] CV LAB;  Service: Cardiovascular;  Laterality: Left;  PERONEAL  . STUMP REVISION Left 01/11/2017   Procedure: Revision Left Transmetatarsal Amputation;  Surgeon: [name], MD;  Location: [place];  Service: Orthopedics;  Laterality: Left;     Home Medications:  Prior to Admission medications   Medication Sig Start Date End Date Taking? Authorizing Provider  acetaminophen (TYLENOL) 500 MG tablet Take 1,000 mg by mouth every 6 (six) hours as needed (for mild pain and CANNOT EXCEED 4,000 MG/24 HOURS).    Yes [provider]  amLODipine (NORVASC) 10 MG tablet Take 1 tablet (10 mg total) by mouth at bedtime. 08/16/15  Yes Short, Noah Delaine, MD  B Complex-C-Folic Acid (NEPHRO-VITE PO) Take 1 tablet by mouth at bedtime.   Yes [provider]  calcitRIOL (ROCALTROL) 0.25 MCG capsule Take 9 capsules (2.25 mcg total) by mouth every Monday, Wednesday, and Friday with hemodialysis. 03/18/17  Yes Rai, Ripudeep K, MD  calcium acetate (PHOSLO) 667 MG capsule Take 3 capsules (2,001 mg total) by mouth 3 (three) times daily with meals. Patient taking differently: Take 1,334 mg by mouth 3 (three) times daily with meals.  03/17/17  Yes Rai, Ripudeep K, MD  clopidogrel (PLAVIX) 75 MG tablet Take 1 tablet (75 mg total) by mouth daily. 11/02/16  Yes Waynetta Sandy, MD  docusate sodium (COLACE) 100 MG capsule Take 1 capsule (100 mg total) by mouth 2 (two) times daily. Patient taking differently: Take 100 mg by mouth at bedtime.  03/17/17  Yes Rai, Ripudeep K, MD  furosemide (LASIX)  20 MG tablet Take 20 mg by mouth 2 (two) times daily.   Yes [provider]  glucagon (GLUCAGON EMERGENCY) 1 MG injection Inject 1 mg into the vein once as needed (low blood sugar and inability to eat or drink sugar). 08/16/15  Yes Short, Noah Delaine, MD  insulin aspart (NOVOLOG) 100 UNIT/ML injection Sliding scale CBG 70 - 120: 0 units CBG 121 - 150: 1 unit,  CBG 151 - 200: 2 units,  CBG 201 - 250: 3 units,  CBG 251 - 300: 5 units,  CBG 301 - 350: 7 units,  CBG 351 - 400: 9 units   CBG > 400: 9 units and notify your MD Patient taking differently: Inject 0-9 Units into the skin See admin instructions. Inject 0-9 units into the skin two times a day per sliding scale: BGL 70-120 = 0 units; 121-150 = 1 unit; 151-200 = 2 units; 201-250 = 3 units; 251-300 = 5 units; 301-350 = 7 units; 351-400 = 9 units; >400 = 9 units and call MD 03/17/17  Yes Rai, Ripudeep K, MD  insulin glargine (LANTUS)  100 UNIT/ML injection Inject 0.07 mLs (7 Units total) into the skin at bedtime. 03/17/17  Yes Rai, Ripudeep K, MD  ipratropium-albuterol (DUONEB) 0.5-2.5 (3) MG/3ML SOLN Take 3 mLs by nebulization 3 (three) times daily as needed (for shortness of breath).    Yes [provider]  lamoTRIgine (LAMICTAL) 150 MG tablet Take 150 mg by mouth at bedtime.   Yes [provider]  lanthanum (FOSRENOL) 1000 MG chewable tablet Chew 2 tablets (2,000 mg total) by mouth 3 (three) times daily with meals. 08/16/15  Yes Short, Noah Delaine, MD  methocarbamol (ROBAXIN) 500 MG tablet Take 1 tablet (500 mg total) by mouth every 6 (six) hours as needed for muscle spasms. 03/17/17  Yes Rai, Ripudeep K, MD  Nutritional Supplements (BOOST GLUCOSE CONTROL) LIQD Take 8 oz by mouth as directed.   Yes [provider]  ondansetron (ZOFRAN) 4 MG tablet Take 4 mg by mouth every 6 (six) hours as needed for nausea.    Yes [provider]  polyethylene glycol (MIRALAX / GLYCOLAX) packet Take 17 g by mouth daily. Patient  taking differently: Take 17 g by mouth daily as needed for mild constipation.  03/17/17  Yes Rai, Ripudeep K, MD  Amino Acids-Protein Hydrolys (FEEDING SUPPLEMENT, PRO-STAT SUGAR FREE 64,) LIQD Take 30 mLs by mouth 2 (two) times daily. Patient not taking: Reported on 10/28/2017 03/17/17   Mendel Corning, MD  carvedilol (COREG) 6.25 MG tablet Take 1 tablet (6.25 mg total) by mouth 2 (two) times daily with a meal. Patient not taking: Reported on 10/28/2017 03/17/17   Rai, Vernelle Emerald, MD  fentaNYL (DURAGESIC - DOSED MCG/HR) 12 MCG/HR Place 1 patch (12.5 mcg total) onto the skin every 3 (three) days. Patient not taking: Reported on 10/28/2017 03/18/17   Mendel Corning, MD  multivitamin (RENA-VIT) TABS tablet Take 1 tablet by mouth at bedtime. Patient not taking: Reported on 10/28/2017 03/17/17   Rai, Vernelle Emerald, MD  nicotine (NICODERM CQ - DOSED IN MG/24 HOURS) 21 mg/24hr patch Place 1 patch (21 mg total) onto the skin daily. Patient not taking: Reported on 10/28/2017 03/17/17   Rai, Vernelle Emerald, MD  oxyCODONE (OXY IR/ROXICODONE) 5 MG immediate release tablet Take 1-2 tablets (5-10 mg total) by mouth every 4 (four) hours as needed for breakthrough pain. Patient not taking: Reported on 10/28/2017 03/17/17   Rai, Vernelle Emerald, MD  lisinopril (PRINIVIL,ZESTRIL) 10 MG tablet Take 1 tablet (10 mg total) by mouth daily. Patient not taking: Reported on 04/24/2015 09/15/14 04/24/15  Francesca Oman, DO  metoprolol tartrate (LOPRESSOR) 25 MG tablet Take 1 tablet (25 mg total) by mouth 2 (two) times daily. Patient not taking: Reported on 04/24/2015 09/15/14 04/24/15  Francesca Oman, DO  pantoprazole (PROTONIX) 40 MG tablet Take 1 tablet (40 mg total) by mouth daily. Patient not taking: Reported on 04/24/2015 09/15/14 04/24/15  Francesca Oman, DO    Inpatient Medications: Scheduled Meds: . amLODipine  10 mg Oral QHS  . aspirin  324 mg Oral Once  . [START ON 10/31/2017] calcitRIOL  2.25 mcg Oral Q M,W,F-HD  . calcium acetate  1,334 mg Oral TID  WC  . clopidogrel  75 mg Oral Daily  . docusate sodium  100 mg Oral QHS  . feeding supplement (PRO-STAT SUGAR FREE 64)  30 mL Oral BID  . insulin aspart  0-9 Units Subcutaneous TID WC  . lamoTRIgine  150 mg Oral QHS  . lanthanum  1,000 mg Oral TID WC  .  nicotine  14 mg Transdermal Daily  . nitroGLYCERIN  0.4 mg Transdermal Daily  . sodium chloride flush  3 mL Intravenous Q12H   Continuous Infusions:  PRN Meds: acetaminophen, ipratropium-albuterol, methocarbamol, nitroGLYCERIN, ondansetron (ZOFRAN) IV, ondansetron, polyethylene glycol  Allergies:    Allergies  Allergen Reactions  . Coconut Oil Anaphylaxis    Can use topically, allergic to coconut foods     Social History:   Social History   Socioeconomic History  . Marital status: Legally Separated    Spouse name: Not on file  . Number of children: Not on file  . Years of education: Not on file  . Highest education level: Not on file  Social Needs  . Financial resource strain: Not on file  . Food insecurity - worry: Not on file  . Food insecurity - inability: Not on file  . Transportation needs - medical: Not on file  . Transportation needs - non-medical: Not on file  Occupational History  . Not on file  Tobacco Use  . Smoking status: Current Every Day Smoker    Packs/day: 0.50    Years: 26.00    Pack years: 13.00    Types: Cigarettes  . Smokeless tobacco: Never Used  Substance and Sexual Activity  . Alcohol use: Yes    Comment: 03/08/2017 "haven't took a drink in over 5 years"  . Drug use: Yes    Types: Marijuana    Comment: 03/08/2017 "basically q day"   . Sexual activity: Yes  Other Topics Concern  . Not on file  Social History Narrative  . Not on file    Family History:    Family History  Problem Relation Age of Onset  . Heart failure Mother   . Hypertension Mother      ROS:  Please see the history of present illness.  ROS  All other ROS reviewed and negative.     Physical Exam/Data:    Vitals:   10/29/17 0215 10/29/17 0220 10/29/17 0239 10/29/17 0241  BP: (!) 105/51 101/89 118/69 128/74  Pulse: (!) 102 92 75 72  Resp:      Temp:      TempSrc:      SpO2: 99% 99% 100% 99%  Weight:      Height:       No intake or output data in the 24 hours ending 10/29/17 0249 Filed Weights   10/28/17 1509  Weight: 71.7 kg (158 lb)   Body mass index is 20.29 kg/m.  General:  Well nourished, well developed, in no acute distress HEENT: normal Neck: JVD at mid-neck at 90 degrees Cardiac:  normal S1, S2; RRR; with II/VI systolic murmur  Lungs:  clear to auscultation bilaterally, no wheezing, rhonchi or rales  Abd: soft, nontender, no hepatomegaly  Ext: s/p L BKA. No RLE edema  Musculoskeletal:  No deformities, BUE and BLE strength normal and equal Skin: warm and dry  Neuro:  No focal abnormalities noted Psych:  Normal affect   EKG:  The EKG was personally reviewed and demonstrates:  Sinus tachycardia with poor R wave progression, ST depression in inferior and lateral leads.  Telemetry:  Telemetry was personally reviewed and demonstrates:  NSR when viewed on monitor  Relevant CV Studies: TTE 08/2014: - Left ventricle: The cavity size was normal. Wall thickness was increased in a pattern of moderate LVH. Systolic function was normal. The estimated ejection fraction was in the range of 50% to 55%. Wall motion was normal; there were no regional  wall motion abnormalities. Features are consistent with a pseudonormal left ventricular filling pattern, with concomitant abnormal relaxation and increased filling pressure (grade 2 diastolic dysfunction). - Aortic valve: Valve area (VTI): 3.53 cm^2. Valve area (Vmax): 3.37 cm^2. Valve area (Vmean): 3.2 cm^2. - Left atrium: The atrium was moderately to severely dilated. - Atrial septum: The septum bowed from left to right, consistent with increased left atrial pressure.  LHC 2016: 1. Combined systolic and  diastolic heart failure with estimated ejection fraction of 35-40% an extremely elevated left ventricular end diastolic pressure of 36 mmHg. Etiology is uncertain but likely hypertension related. 2. Heavily calcified but widely patent coronary arteries. The mid to distal LAD contains an eccentric 50-60%, and the continuation of the circumflex beyond the second marginal contains 90% stenosis. The circumflex beyond the second marginal is very small in distribution and is not clinically relevant. 3. Prolonged chest pain of uncertain etiology 4. Elevated troponin related to end-stage renal disease and cardiomyopathy.  Laboratory Data:  Chemistry Recent Labs  Lab 10/28/17 1530  NA 131*  K 4.0  CL 90*  CO2 22  GLUCOSE 322*  BUN 68*  CREATININE 12.66*  CALCIUM 9.0  GFRNONAA 4*  GFRAA 5*  ANIONGAP 19*    Recent Labs  Lab 10/28/17 1529  PROT 7.1  ALBUMIN 3.5  AST 18  ALT 19  ALKPHOS 98  BILITOT 0.7   Hematology Recent Labs  Lab 10/28/17 1530  WBC 7.4  RBC 4.81  HGB 14.0  HCT 41.8  MCV 86.9  MCH 29.1  MCHC 33.5  RDW 15.8*  PLT 350   Cardiac Enzymes Recent Labs  Lab 10/28/17 2220  TROPONINI 0.10*    Recent Labs  Lab 10/28/17 1608 10/28/17 1843  TROPIPOC 0.03 0.07    BNPNo results for input(s): BNP, PROBNP in the last 168 hours.  DDimer  Recent Labs  Lab 10/28/17 1529  DDIMER <0.27    Radiology/Studies:  Dg Chest 2 View  Result Date: 10/28/2017 CLINICAL DATA:  Hervey Ard right-sided chest pain. EXAM: CHEST - 2 VIEW COMPARISON:  06/04/2017 and 06/10/2015 FINDINGS: The heart size and pulmonary vascularity are normal. Lungs are clear except for slight thickening of the fissures which is chronic. No effusions. Slight calcification in the arch of the aorta. No bone abnormality. IMPRESSION: No active cardiopulmonary disease. Electronically Signed   By: Lorriane Shire M.D.   On: 10/28/2017 16:22    Assessment and Plan:   Chest pain Elevated troponin CAD HFrEF  with improved EF The patient has known CAD with prior HFrEF with improved EF. He has multiple risk factors for progressive vascular disease including DM and ESRD and HTN. He presents with chest pain that has typical and atypical features. ECGs have shown some dynamic changes and troponin is mildly elevated after initial negative measurement. His presentation has features concerning for possible progressive angina vs NSTEMI; however these findings may also represent demand ischemia (in the setting of elevated BP and slight volume overload from missing dialysis) rather than true plaque rupture leading to ACS. He is currently chest pain free. At this time, would recommend empiric treatment for ACS with ongoing monitoring of symptoms. He would benefit from repeat cardiac workup as well. -Continue to monitor symptoms -Continue to trend troponin  -Continue BP control and management of volume status with dialysis -Agree with empiric heparin infusion -s/p ASA 325. Home clopidogrel has been continued (although switching to ASA may be reasonable based on results of workup). -Lipid panel in process. May  benefit from atorvastatin despite ESRD. -Repeat echocardiogram ordered -Will likely require repeat ischemic evaluation, with type and timing to be determined based on clinical trajectory.   For questions or updates, please contact [employer] Please consult [email or web address] for contact info under Cardiology/STEMI.   Signed, Nila Nephew, MD  10/29/2017 2:49 AM

## 2017-10-29 NOTE — Progress Notes (Addendum)
Family Medicine Teaching Service Daily Progress Note Intern Pager: [phone]  Patient name: [name] Rakes Sr. Medical record number: [id number] Date of birth: 1976-01-16 Age: 42 y.o. Gender: male  Primary Care Provider: Patient, [name] Per Consultants: Cardiology Code Status: Full   Pt Overview and Major Events to Date:   Assessment and Plan: [name] Gautier Sr. is a 42 y.o. male presenting with chest pain . PMH is significant for T2DM, CAD, HTN, S/P unilateral BKA, ESRD, HrEF, PAD   ACS R/o: Episode of chest pain at 3AM, new EKG changes noted at that time. Patient was started on heparin drip and Nitro patch. EKG today with continued -ST depressions  Lead 2, 3 and AVF, V5 and V6, slight ST elevation in V. Troponins, in initial uptrending now stable, 0.03>0.07>0.10>0.10.  D-dimer neg.  CXR neg.  Aspirin 324 in ED. Hx of Left heart Cath in 2016 that showed lesions including 60% LAD and 90% OM2 stenoses. -Cardiology following  -trend troponins -TSH pending  -daily bmp/cbc -HIV pending  -plavix - Continue heparin drip and Nitro patch  - Start Lipitor 40 mg given T2DM   T2DM- A1C 12.1. Sliding Scale in Nursing Home -SSI w/ cbgs - Needs to be on long acting insulin  - Start Lantus 10 units pending cardiology   Chronic HFrEF, grade 2DD.  Last echo 2016 with EF of 50-55.  Patient does not appear to be taking meds.  No current edema/SOB orthopnea. Euvolemic on exam. - Will likely benefit from beta blocker - Coreg 3.125 BID  - Will also start lisinopril 2.5 mg  - ECHO pending   HTN:  -home amlodipine 10 - Consider switching to ACE   CAD- s/p left BKA -no current complaints/lesions reported  -home plavix  ESRD: on HD MWF. Missed HD today for housing meeting -consult nephro in AM -home calcitrol - Home phoslo  chronic pain: -home robaxin  Tobacco Abuse  - Nictonine patch  Mood disorder -home lamictal 150  Nausea: vomited 3x during the last few days,  no nausea on admission exam -zophran prn  FEN/GI: NPO for now,  renal/carb modified, prostat supplement, colace prn, miralax prn Prophylaxis: heparin drip  Disposition: None  Subjective:  Patient is doing well this morning. Denies any chest pain   Objective: Temp:  [96.8 F (36 C)-98 F (36.7 C)] 98 F (36.7 C) (03/15 1639) Pulse Rate:  [72-118] 82 (03/16 0400) Resp:  [14-25] 17 (03/16 0045) BP: (101-182)/(51-131) 120/62 (03/16 0400) SpO2:  [95 %-100 %] 98 % (03/16 0400) Weight:  [158 lb (71.7 kg)] 158 lb (71.7 kg) (03/15 1509) Physical Exam: General: NAD, Cardiovascular: RRR, no murmurs  Respiratory: Slight bibasilar crackles, otherwise CTAB, no increased WOB  Abdomen: BS+, no ttp  Extremities:No lower extremity edema   Laboratory: Recent Labs  Lab 10/28/17 1530  WBC 7.4  HGB 14.0  HCT 41.8  PLT 350   Recent Labs  Lab 10/28/17 1529 10/28/17 1530 10/29/17 0239  NA  --  131*  --   K  --  4.0  --   CL  --  90*  --   CO2  --  22  --   BUN  --  68*  --   CREATININE  --  12.66* 13.89*  CALCIUM  --  9.0  --   PROT 7.1  --   --   BILITOT 0.7  --   --   ALKPHOS 98  --   --   ALT 19  --   --  AST 18  --   --   GLUCOSE  --  322*  --      Tonette Bihari, MD 10/29/2017, 5:58 AM PGY-3 [place] Intern pager: [phone], text pages welcome

## 2017-10-29 NOTE — Progress Notes (Signed)
Pt is resting comfortably.  No concerns. Denies CP or SOB at this time. Troponin not consistent with acute mi though he does have new TWI. Plans for cath noted for Monday. Continue on IV heparin  Cardiology to follow  Thompson Grayer MD, [facility] 10/29/2017 2:37 PM

## 2017-10-29 NOTE — Progress Notes (Signed)
INTERIM PROGRESS NOTE  Paged that patient had woken from sleep crying and complainnig of 10/10 pain.   His nurse obtained a ecg that showed ST changes and started nitro sublingual per orders  On arrival to ED patient was rating pain 9/10 and had just received his 2nd dose of nitro.   I reviewed the ECG and paged the [facility] senior resident and the cardiology on call.  Plebotomy was coming by and drawing scheduled troponin which has not resulted yet  Cardiology suggested nitro patch and will evaluate patient.  -Dr. Criss Rosales

## 2017-10-29 NOTE — Progress Notes (Signed)
ANTICOAGULATION CONSULT NOTE - Initial Consult  Pharmacy Consult for Heparin  Indication: chest pain/ACS  Allergies  Allergen Reactions  . Coconut Oil Anaphylaxis    Can use topically, allergic to coconut foods    Patient Measurements: Height: 6\' 2"  (188 cm) Weight: 158 lb (71.7 kg) IBW/kg (Calculated) : 82.2  Vital Signs: Temp: 98 F (36.7 C) (03/15 1639) Temp Source: Oral (03/15 1639) BP: 128/74 (03/16 0241) Pulse Rate: 72 (03/16 0241)  Labs: Recent Labs    10/28/17 1530 10/28/17 2220  HGB 14.0  --   HCT 41.8  --   PLT 350  --   CREATININE 12.66*  --   TROPONINI  --  0.10*    Estimated Creatinine Clearance: 7.9 mL/min (A) (by C-G formula based on SCr of 12.66 mg/dL (H)).   Medical History: Past Medical History:  Diagnosis Date  . Anemia   . Atherosclerosis of lower extremity ([place])   . CAD (coronary artery disease)   . Chronic combined systolic and diastolic heart failure ([place])   . Complication of anesthesia   . ESRD (end stage renal disease) on dialysis [facility])    "MWF [facility]" (03/08/2017)  . GERD (gastroesophageal reflux disease)   . Heart murmur   . History of blood transfusion    "related to OR"  . Hypertension   . PONV (postoperative nausea and vomiting)   . Type II diabetes mellitus ([facility])    Assessment: 42 y/o M to start heparin for CP, mildly elevated troponin, CBC good, ESRD on HD, PTA meds reviewed.   Goal of Therapy:  Heparin level 0.3-0.7 units/ml Monitor platelets by anticoagulation protocol: Yes   Plan:  Heparin 4000 units BOLUS Start heparin drip at 1000 units/hr 1200 HL Daily CBC/HL Monitor for bleeding   Narda Bonds 10/29/2017,2:50 AM

## 2017-10-29 NOTE — ED Notes (Addendum)
Patient awoke from sleep, screaming "nurse, nurse I am in pain, I am having chest pain". Pain level is 10/10. Patient then began dry heaving. Repeat EKG administered and Nitro administered.  Criss Rosales, Md at bedside.

## 2017-10-29 NOTE — Clinical Social Work Note (Signed)
Clinical Social Work Assessment  Patient Details  Name: [name] Mayorga Sr. MRN: [id number] Date of Birth: 01/20/1976  Date of referral:  10/29/17               Reason for consult:  Facility Placement(from [facility] )                Permission sought to share information with:    Permission granted to share information::  No  Name::     none given  Agency::  none given  Relationship::  none given  Contact Information:  none given   Housing/Transportation Living arrangements for the past 2 months:  Skilled Nursing Facility([facility] ) Source of Information:  Patient Patient Interpreter Needed:  None Criminal Activity/Legal Involvement Pertinent to Current Situation/Hospitalization:  No - Comment as needed Significant Relationships:  Siblings(pt reports having a number of sisters. ) Lives with:  Facility Resident Do you feel safe going back to the place where you live?  Yes Need for family participation in patient care:  Yes (Comment)  Care giving concerns:  CSW spoke with pt at bedside. During this time pt expressed no concerns to CSW. PT did mention that pt has no where to go other than back to [place] at the time of discharge.    Social Worker assessment / plan:  CSW spoke with pt at bedside. During this time CSW was informed that pt is from [facility]. Pt reports that pt has been at this facility since August 2018. Pt reported that pt has no other place to stay but does have sisters that come and see pt as they can. During this assessment pt presented as tired and sleepy, however was very appropriate to CSW and answered questions as best as possible. Pt expressed that pt's plans are to return to [place] at the time of discharge.,   Employment status:  Unemployed Insurance information:  Medicaid In [place] PT Recommendations:  Not assessed at this time Information / Referral to community resources:  Skilled Nursing Facility(spoke with pt about [facility] and how it  has been going for pt there. )  Patient/Family's Response to care:  Pt appeared to be understanding and agreeable top plan of care at this time. Pt shook head and even smiled to express understanding of care.   Patient/Family's Understanding of and Emotional Response to Diagnosis, Current Treatment, and Prognosis:  No further questions or concerns have been presented to CSW pt. Pt's emotional response was positive with no distress or uncertainty presented.   Emotional Assessment Appearance:  Appears older than stated age Attitude/Demeanor/Rapport:  Other(tired and sleepy) Affect (typically observed):  Calm Orientation:  Oriented to  Time, Oriented to Place, Oriented to Self, Oriented to Situation Alcohol / Substance use:  Not Applicable Psych involvement (Current and /or in the community):  No (Comment)  Discharge Needs  Concerns to be addressed:  Basic Needs Readmission within the last 30 days:  No Current discharge risk:  Lack of support system Barriers to Discharge:  Continued Medical Work up   [employer], [place] 10/29/2017, 8:10 AM

## 2017-10-30 ENCOUNTER — Inpatient Hospital Stay (HOSPITAL_COMMUNITY): Payer: Medicaid Other

## 2017-10-30 LAB — GLUCOSE, CAPILLARY
GLUCOSE-CAPILLARY: 205 mg/dL — AB (ref 65–99)
GLUCOSE-CAPILLARY: 135 mg/dL — AB (ref 65–99)
GLUCOSE-CAPILLARY: 254 mg/dL — AB (ref 65–99)
Glucose-Capillary: 157 mg/dL — ABNORMAL HIGH (ref 65–99)

## 2017-10-30 LAB — PROTIME-INR
INR: 1
Prothrombin Time: 13.1 seconds (ref 11.4–15.2)

## 2017-10-30 LAB — RENAL FUNCTION PANEL
Albumin: 3.6 g/dL (ref 3.5–5.0)
Anion gap: 17 — ABNORMAL HIGH (ref 5–15)
BUN: 37 mg/dL — AB (ref 6–20)
CHLORIDE: 91 mmol/L — AB (ref 101–111)
CO2: 25 mmol/L (ref 22–32)
Calcium: 8.2 mg/dL — ABNORMAL LOW (ref 8.9–10.3)
Creatinine, Ser: 9.04 mg/dL — ABNORMAL HIGH (ref 0.61–1.24)
GFR calc Af Amer: 7 mL/min — ABNORMAL LOW (ref >60)
GFR calc non Af Amer: 6 mL/min — ABNORMAL LOW (ref >60)
GLUCOSE: 333 mg/dL — AB (ref 65–99)
POTASSIUM: 3.9 mmol/L (ref 3.5–5.1)
Phosphorus: 5.9 mg/dL — ABNORMAL HIGH (ref 2.5–4.6)
Sodium: 133 mmol/L — ABNORMAL LOW (ref 135–145)

## 2017-10-30 LAB — CBC
HCT: 43.4 % (ref 39.0–52.0)
Hemoglobin: 14.9 g/dL (ref 13.0–17.0)
MCH: 29.6 pg (ref 26.0–34.0)
MCHC: 34.3 g/dL (ref 30.0–36.0)
MCV: 86.1 fL (ref 78.0–100.0)
Platelets: 386 10*3/uL (ref 150–400)
RBC: 5.04 MIL/uL (ref 4.22–5.81)
RDW: 15.5 % (ref 11.5–15.5)
WBC: 7.4 10*3/uL (ref 4.0–10.5)

## 2017-10-30 LAB — HEPARIN LEVEL (UNFRACTIONATED): Heparin Unfractionated: 0.35 IU/mL (ref 0.30–0.70)

## 2017-10-30 LAB — ECHOCARDIOGRAM COMPLETE
HEIGHTINCHES: 74 in
Weight: 2704 oz

## 2017-10-30 LAB — MRSA PCR SCREENING: MRSA by PCR: POSITIVE — AB

## 2017-10-30 MED ORDER — SODIUM CHLORIDE 0.9% FLUSH: 3.0000 mL | INTRAVENOUS | Status: DC | PRN

## 2017-10-30 MED ORDER — SODIUM CHLORIDE 0.9% FLUSH
3.0000 mL | Freq: Two times a day (BID) | INTRAVENOUS | Status: DC
  Administered 2017-10-30: 3 mL via INTRAVENOUS

## 2017-10-30 MED ORDER — LIDOCAINE HCL (PF) 1 % IJ SOLN
5.0000 mL | INTRAMUSCULAR | Status: DC | PRN
Start: 2017-10-30 — End: 2017-10-31

## 2017-10-30 MED ORDER — SODIUM CHLORIDE 0.9 % IV SOLN: 100.0000 mL | INTRAVENOUS | Status: DC | PRN

## 2017-10-30 MED ORDER — HEPARIN SODIUM (PORCINE) 1000 UNIT/ML DIALYSIS
1000.0000 [IU] | INTRAMUSCULAR | Status: DC | PRN
  Filled 2017-10-30: qty 1

## 2017-10-30 MED ORDER — LIDOCAINE-PRILOCAINE 2.5-2.5 % EX CREA
1.0000 "application " | TOPICAL_CREAM | CUTANEOUS | Status: DC | PRN
  Filled 2017-10-30: qty 5

## 2017-10-30 MED ORDER — INSULIN GLARGINE 100 UNIT/ML ~~LOC~~ SOLN
8.0000 [IU] | Freq: Every day | SUBCUTANEOUS | Status: DC
  Administered 2017-10-31 – 2017-11-04 (×3): 8 [IU] via SUBCUTANEOUS
  Filled 2017-10-30 (×5): qty 0.08

## 2017-10-30 MED ORDER — ASPIRIN 81 MG PO CHEW
81.0000 mg | CHEWABLE_TABLET | ORAL | Status: AC
  Administered 2017-10-31: 81 mg via ORAL
  Filled 2017-10-30: qty 1

## 2017-10-30 MED ORDER — SODIUM CHLORIDE 0.9 % IV SOLN: 250.0000 mL | INTRAVENOUS | Status: DC | PRN

## 2017-10-30 MED ORDER — SODIUM CHLORIDE 0.9 % IV SOLN
INTRAVENOUS | Status: DC
  Administered 2017-10-31: 10 mL/h via INTRAVENOUS

## 2017-10-30 MED ORDER — PENTAFLUOROPROP-TETRAFLUOROETH EX AERO: 1.0000 "application " | INHALATION_SPRAY | CUTANEOUS | Status: DC | PRN

## 2017-10-30 MED ORDER — ALTEPLASE 2 MG IJ SOLR: 2.0000 mg | Freq: Once | INTRAMUSCULAR | Status: DC | PRN

## 2017-10-30 NOTE — Progress Notes (Signed)
2D Echocardiogram has been performed.  [name] 10/30/2017, 11:00 AM

## 2017-10-30 NOTE — H&P (View-Only) (Signed)
Subjective:  No chest discomfort overnight.  No shortness of breath.  Currently getting echo  Objective:  Vital Signs in the last 24 hours: BP 120/63 (BP Location: Right Arm)   Pulse 88   Temp 98.9 F (37.2 C) (Oral)   Resp 15   Ht 6\' 2"  (1.88 m)   Wt 76.7 kg (169 lb)   SpO2 99%   BMI 21.70 kg/m   Physical Exam: Pleasant black male in no acute distress Lungs:  Clear Cardiac:  Regular rhythm, normal S1 and S2, no S3, 1/6 systolic murmur Extremities:  Dialysis fistula present, femoral pulses present  Intake/Output from previous day: 03/16 0701 - 03/17 0700 In: 753 [P.O.:462; I.V.:291] Out: 4000   Weight Filed Weights   10/29/17 1848 10/29/17 2253 10/30/17 0408  Weight: 81 kg (178 lb 9.2 oz) 77 kg (169 lb 12.1 oz) 76.7 kg (169 lb)    Lab Results: Basic Metabolic Panel: Recent Labs    10/29/17 1900 10/30/17 0412  NA 130* 133*  K 4.5 3.9  CL 87* 91*  CO2 19* 25  GLUCOSE 266* 333*  BUN 88* 37*  CREATININE 15.17* 9.04*   CBC: Recent Labs    10/28/17 1530  10/29/17 1900 10/30/17 0412  WBC 7.4   < > 8.1 7.4  NEUTROABS 4.9  --   --   --   HGB 14.0   < > 13.8 14.9  HCT 41.8   < > 41.0 43.4  MCV 86.9   < > 85.8 86.1  PLT 350   < > 377 386   < > = values in this interval not displayed.   Cardiac Enzymes: Troponin (Point of Care Test) Recent Labs    10/28/17 1843  TROPIPOC 0.07   Cardiac Panel (last 3 results) Recent Labs    10/28/17 2220 10/29/17 0240 10/29/17 1120  TROPONINI 0.10* 0.10* 0.09*    Telemetry: Sinus rhythm   Assessment/Plan:  1.  Unstable angina pectoris 2.  Chest pain 3.  Heart failure with preserved ejection fraction 4.  Coronary artery disease previously with mid to distal LAD disease and circumflex disease  Recommendations:  Catheterization plan from femoral approach tomorrow.Cardiac catheterization was discussed with the patient fully including risks of myocardial infarction, death, stroke, bleeding, arrhythmia, dye  allergy, renal insufficiency or bleeding.  The patient understands and is willing to proceed.  Possibility of intervention at the same time also discussed with patient and he understands and is agreeable to proceed    W. Doristine Church  MD [facility] Cardiology  10/30/2017, 10:43 AM

## 2017-10-30 NOTE — Progress Notes (Signed)
Subjective:  No chest discomfort overnight.  No shortness of breath.  Currently getting echo  Objective:  Vital Signs in the last 24 hours: BP 120/63 (BP Location: Right Arm)   Pulse 88   Temp 98.9 F (37.2 C) (Oral)   Resp 15   Ht 6\' 2"  (1.88 m)   Wt 76.7 kg (169 lb)   SpO2 99%   BMI 21.70 kg/m   Physical Exam: Pleasant black male in no acute distress Lungs:  Clear Cardiac:  Regular rhythm, normal S1 and S2, no S3, 1/6 systolic murmur Extremities:  Dialysis fistula present, femoral pulses present  Intake/Output from previous day: 03/16 0701 - 03/17 0700 In: 753 [P.O.:462; I.V.:291] Out: 4000   Weight Filed Weights   10/29/17 1848 10/29/17 2253 10/30/17 0408  Weight: 81 kg (178 lb 9.2 oz) 77 kg (169 lb 12.1 oz) 76.7 kg (169 lb)    Lab Results: Basic Metabolic Panel: Recent Labs    10/29/17 1900 10/30/17 0412  NA 130* 133*  K 4.5 3.9  CL 87* 91*  CO2 19* 25  GLUCOSE 266* 333*  BUN 88* 37*  CREATININE 15.17* 9.04*   CBC: Recent Labs    10/28/17 1530  10/29/17 1900 10/30/17 0412  WBC 7.4   < > 8.1 7.4  NEUTROABS 4.9  --   --   --   HGB 14.0   < > 13.8 14.9  HCT 41.8   < > 41.0 43.4  MCV 86.9   < > 85.8 86.1  PLT 350   < > 377 386   < > = values in this interval not displayed.   Cardiac Enzymes: Troponin (Point of Care Test) Recent Labs    10/28/17 1843  TROPIPOC 0.07   Cardiac Panel (last 3 results) Recent Labs    10/28/17 2220 10/29/17 0240 10/29/17 1120  TROPONINI 0.10* 0.10* 0.09*    Telemetry: Sinus rhythm   Assessment/Plan:  1.  Unstable angina pectoris 2.  Chest pain 3.  Heart failure with preserved ejection fraction 4.  Coronary artery disease previously with mid to distal LAD disease and circumflex disease  Recommendations:  Catheterization plan from femoral approach tomorrow.Cardiac catheterization was discussed with the patient fully including risks of myocardial infarction, death, stroke, bleeding, arrhythmia, dye  allergy, renal insufficiency or bleeding.  The patient understands and is willing to proceed.  Possibility of intervention at the same time also discussed with patient and he understands and is agreeable to proceed    W. Doristine Church  MD [facility] Cardiology  10/30/2017, 10:43 AM

## 2017-10-30 NOTE — Progress Notes (Signed)
Family Medicine Teaching Service Daily Progress Note Intern Pager: [phone]  Patient name: [name] Harville Sr. Medical record number: [id number] Date of birth: Oct 17, 1975 Age: 42 y.o. Gender: male  Primary Care Provider: Patient, [name] Per Consultants: Cardiology Code Status: Full   Pt Overview and Major Events to Date:   Assessment and Plan: Daryan Buell Sr. is a 42 y.o. male presenting with chest pain . PMH is significant for T2DM, CAD, HTN, S/P unilateral BKA, ESRD, HrEF, PAD   ACS R/o: Episode of chest pain at 3AM, new EKG changes noted at that time. Patient was started on heparin drip and Nitro patch. EKG today with continued -ST depressions  Lead 2, 3 and AVF, V5 and V6, slight ST elevation in V. Troponins initiation uptrending but then stabilized. Troponin trend: 0.03>0.07>0.10>0.10z>0.09.  D-dimer neg.  CXR neg.  TSH WNL. Aspirin 324 in ED. Hx of Left heart Cath in 2016 that showed lesions including 60% LAD and 90% OM2 stenoses. -Cardiology following, appreciate recs; plan for heart cath 3/18  -daily bmp/cbc -plavix - Continue heparin drip and Nitro patch  - Start Lipitor 40 mg given T2DM   T2DM- A1C 12.1. Sliding Scale in Nursing Home -SSI w/ cbgs -Lantus 5u nightly; increase to 8u   Chronic HFrEF, grade 2DD.  Last echo 2016 with EF of 50-55.  Patient does not appear to be taking meds.  No current edema/SOB orthopnea. Euvolemic on exam. - Will likely benefit from beta blocker - Coreg 3.125 BID  - Will also start lisinopril 2.5 mg  - ECHO pending   HTN:  -home amlodipine 10 -coreg 3.25 mg BID  -lisinopril 2.5 mg   CAD- s/p left BKA -no current complaints/lesions reported  -home plavix  ESRD: on HD MWF. HD completed 3/16 due to missed dialysis on 3/15.  -nephro following for HD, appreciate assistance  -home calcitrol - Home phoslo  chronic pain: -home robaxin  Tobacco Abuse  - Nictonine patch  Mood disorder -home lamictal  150  Nausea: vomited 3x during the last few days, no nausea on admission exam -zophran prn  FEN/GI:  renal/carb modified diet with NPO at MN, prostat supplement, colace prn, miralax prn Prophylaxis: heparin drip  Disposition: Pending heart cath, plan for return to SNF   Subjective:  Patient is doing well this morning. Denies any chest pain. Sleeping comfortably.   Objective: Temp:  [98 F (36.7 C)-99.1 F (37.3 C)] 98.9 F (37.2 C) (03/17 0408) Pulse Rate:  [60-92] 88 (03/17 0408) Resp:  [15-20] 15 (03/17 0408) BP: (106-165)/(40-78) 120/63 (03/17 0408) SpO2:  [97 %-99 %] 99 % (03/17 0408) Weight:  [169 lb (76.7 kg)-178 lb 9.2 oz (81 kg)] 169 lb (76.7 kg) (03/17 0408) Physical Exam: General: NAD, lying in bed  Cardiovascular: RRR, no murmurs  Respiratory: CTAB, no increased WOB  Abdomen: BS+, no ttp  Extremities:No lower extremity edema, left leg BKA   Laboratory: Recent Labs  Lab 10/29/17 1120 10/29/17 1900 10/30/17 0412  WBC 8.8 8.1 7.4  HGB 14.2 13.8 14.9  HCT 41.0 41.0 43.4  PLT 361 377 386   Recent Labs  Lab 10/28/17 1529  10/28/17 1530 10/29/17 0239 10/29/17 1900 10/30/17 0412  NA  --   --  131*  --  130* 133*  K  --   --  4.0  --  4.5 3.9  CL  --   --  90*  --  87* 91*  CO2  --   --  22  --  19* 25  BUN  --   --  68*  --  88* 37*  CREATININE  --    < > 12.66* 13.89* 15.17* 9.04*  CALCIUM  --   --  9.0  --  8.5* 8.2*  PROT 7.1  --   --   --   --   --   BILITOT 0.7  --   --   --   --   --   ALKPHOS 98  --   --   --   --   --   ALT 19  --   --   --   --   --   AST 18  --   --   --   --   --   GLUCOSE  --   --  322*  --  266* 333*   < > = values in this interval not displayed.     Nicolette Bang, DO 10/30/2017, 9:01 AM PGY-3 [facility] Intern pager: [phone], text pages welcome

## 2017-10-30 NOTE — Progress Notes (Signed)
[facility] Progress Note   Subjective: Says he is sleepy, no C/O chest pain this AM. Going for cath tomorrow. Pleasant, NAD. ST depression visible on cardiac monitor.   Objective Vitals:   10/29/17 2230 10/29/17 2253 10/29/17 2322 10/30/17 0408  BP: (!) 120/40 (!) 150/58 118/78 120/63  Pulse: 92 90 80 88  Resp: 18 18 16 15   Temp:  98 F (36.7 C) 99.1 F (37.3 C) 98.9 F (37.2 C)  TempSrc:  Oral Oral Oral  SpO2:  99% 97% 99%  Weight:  77 kg (169 lb 12.1 oz)  76.7 kg (169 lb)  Height:       Physical Exam General: Pleasant, NAD Heart: G8,T1 2/6 systolic M. No JVD.  Lungs: CTAB A/P Abdomen: Active BS Extremities: No LE edema.  Dialysis Access: LUA AVF + bruit  Additional Objective Labs: Basic Metabolic Panel: Recent Labs  Lab 10/28/17 1530 10/29/17 0239 10/29/17 1900 10/30/17 0412  NA 131*  --  130* 133*  K 4.0  --  4.5 3.9  CL 90*  --  87* 91*  CO2 22  --  19* 25  GLUCOSE 322*  --  266* 333*  BUN 68*  --  88* 37*  CREATININE 12.66* 13.89* 15.17* 9.04*  CALCIUM 9.0  --  8.5* 8.2*  PHOS  --   --  9.1* 5.9*   Liver Function Tests: Recent Labs  Lab 10/28/17 1529 10/29/17 1900 10/30/17 0412  AST 18  --   --   ALT 19  --   --   ALKPHOS 98  --   --   BILITOT 0.7  --   --   PROT 7.1  --   --   ALBUMIN 3.5 3.3* 3.6   No results for input(s): LIPASE, AMYLASE in the last 168 hours. CBC: Recent Labs  Lab 10/28/17 1530 10/29/17 1120 10/29/17 1900 10/30/17 0412  WBC 7.4 8.8 8.1 7.4  NEUTROABS 4.9  --   --   --   HGB 14.0 14.2 13.8 14.9  HCT 41.8 41.0 41.0 43.4  MCV 86.9 85.8 85.8 86.1  PLT 350 361 377 386   Blood Culture    Component Value Date/Time   SDES BLOOD RIGHT HAND 03/07/2017 2304   SPECREQUEST  03/07/2017 2304    BOTTLES DRAWN AEROBIC AND ANAEROBIC Blood Culture adequate volume   CULT NO GROWTH 5 DAYS 03/07/2017 2304   REPTSTATUS 03/13/2017 FINAL 03/07/2017 2304    Cardiac Enzymes: Recent Labs  Lab 10/28/17 2220  10/29/17 0240 10/29/17 1120  TROPONINI 0.10* 0.10* 0.09*   CBG: Recent Labs  Lab 10/29/17 0903 10/29/17 1649 10/29/17 2327 10/30/17 0738  GLUCAP 176* 218* 198* 205*   Iron Studies: No results for input(s): IRON, TIBC, TRANSFERRIN, FERRITIN in the last 72 hours. @lablastinr3 @ Studies/Results: Dg Chest 2 View  Result Date: 10/28/2017 CLINICAL DATA:  Hervey Ard right-sided chest pain. EXAM: CHEST - 2 VIEW COMPARISON:  06/04/2017 and 06/10/2015 FINDINGS: The heart size and pulmonary vascularity are normal. Lungs are clear except for slight thickening of the fissures which is chronic. No effusions. Slight calcification in the arch of the aorta. No bone abnormality. IMPRESSION: No active cardiopulmonary disease. Electronically Signed   By: Lorriane Shire M.D.   On: 10/28/2017 16:22   Medications: . heparin 1,200 Units/hr (10/29/17 2125)   . amLODipine  10 mg Oral QHS  . aspirin  324 mg Oral Once  . atorvastatin  40 mg Oral q1800  . calcitRIOL  1.25 mcg Oral  Q M,W,F-HD  . [START ON 10/31/2017] calcitRIOL  2.25 mcg Oral Q M,W,F-HD  . calcium acetate  1,334 mg Oral TID WC  . carvedilol  3.125 mg Oral BID WC  . clopidogrel  75 mg Oral Daily  . docusate sodium  100 mg Oral QHS  . feeding supplement (PRO-STAT SUGAR FREE 64)  30 mL Oral BID  . insulin aspart  0-9 Units Subcutaneous TID WC  . insulin glargine  8 Units Subcutaneous QHS  . lamoTRIgine  150 mg Oral QHS  . lanthanum  1,000 mg Oral TID WC  . lisinopril  2.5 mg Oral Daily  . nicotine  14 mg Transdermal Daily  . nitroGLYCERIN  0.4 mg Transdermal Daily  . sodium chloride flush  3 mL Intravenous Q12H     Dialysis Orders: Center: [facility] MWF 4 hrs 200 NRe 500/800 73 kg 2.0K/2.0 Ca UFP 2 -Heparin 2400 unit IV TIW -Calcitriol 1.25 mcg PO TIW -Venofer 50 mg IV weekly (last dose 10/26/17)   Assessment/Plan: 1.  Chest pain: ACS vs demand ischemia.Troponin peak .10. ST seg depression noted on EKG inferior laterally. Cardiology  consulted. For cardiac cath tomorrow.   2.  HFrEF: Continue to monitor volume status. Cardiology following.  3.  ESRD -  MWF. HD off schedule 10/29/17. HD tomorrow post cath. K+ 3.9 4.  Hypertension/volume  -Has not been getting to EDW as OP. BP dropping. EDW recently increased. HD 10/29/17 pre wt 81 kg Net UF 4.0 liters Post wt 77 kg. Continue lowering volume but think OP EDW should be increased to about 75 kg. No evidence of volume overload.  5.  Anemia  - HGB 14.9 No ESA.  6.  Metabolic bone disease -  Ca 8.2 Phos 5.9 Contiue VDRA/binders 7.  Nutrition - Albumin 3.6 Renal/Carb Mod diet, renal vit, nepro 8.  DM per primary   Sebrena Engh H. Braison Snoke NP-C 10/30/2017, 10:12 AM  [employer] [phone]

## 2017-10-30 NOTE — Progress Notes (Signed)
[place] for Heparin  Indication: chest pain/ACS  Allergies  Allergen Reactions  . Coconut Oil Anaphylaxis    Can use topically, allergic to coconut foods    Patient Measurements: Height: 6\' 2"  (188 cm) Weight: 169 lb (76.7 kg) IBW/kg (Calculated) : 82.2  Vital Signs: Temp: 98.9 F (37.2 C) (03/17 0408) Temp Source: Oral (03/17 0408) BP: 120/63 (03/17 0408) Pulse Rate: 88 (03/17 0408)  Labs: Recent Labs    10/28/17 2220 10/29/17 0239 10/29/17 0240 10/29/17 1120 10/29/17 1538 10/29/17 1900 10/30/17 0412 10/30/17 0741  HGB  --   --   --  14.2  --  13.8 14.9  --   HCT  --   --   --  41.0  --  41.0 43.4  --   PLT  --   --   --  361  --  377 386  --   HEPARINUNFRC  --   --   --   --  0.20*  --   --  0.35  CREATININE  --  13.89*  --   --   --  15.17* 9.04*  --   TROPONINI 0.10*  --  0.10* 0.09*  --   --   --   --    Estimated Creatinine Clearance: 11.8 mL/min (A) (by C-G formula based on SCr of 9.04 mg/dL (H)).  Assessment: 40 yoM presents with CP, mildly elevated troponin. Pharmacy consulted to dose IV heparin for r/o ACS. Cardiology planning for cath on 3/18. Heparin level now therapeutic at 0.35 following rate increase. CBC stable WNL. No bleeding noted.   Goal of Therapy:  Heparin level 0.3-0.7 units/ml Monitor platelets by anticoagulation protocol: Yes   Plan:  Continue heparin drip at 1200 units/hr Daily CBC and heparin level Monitor for s/sx of bleeding  Erin N. Gerarda Fraction, PharmD PGY1 Pharmacy Resident Pager: [phone] 10/30/2017,8:32 AM

## 2017-10-30 NOTE — Plan of Care (Signed)
NPO after MN for Heart Cath. Procedure discussed. Verbalized understanding. Consent signed.

## 2017-10-31 ENCOUNTER — Inpatient Hospital Stay (HOSPITAL_COMMUNITY)
Admission: EM | Disposition: A | Discharge status: Skilled Nursing Facility | Payer: Self-pay | Source: Home / Self Care | Attending: Family Medicine

## 2017-10-31 ENCOUNTER — Encounter (HOSPITAL_COMMUNITY): Payer: Self-pay | Admitting: Cardiovascular Disease

## 2017-10-31 DIAGNOSIS — I25110 Atherosclerotic heart disease of native coronary artery with unstable angina pectoris: Principal | ICD-10-CM

## 2017-10-31 DIAGNOSIS — R079 Chest pain, unspecified: Secondary | ICD-10-CM

## 2017-10-31 DIAGNOSIS — I2510 Atherosclerotic heart disease of native coronary artery without angina pectoris: Secondary | ICD-10-CM

## 2017-10-31 HISTORY — PX: LEFT HEART CATH AND CORONARY ANGIOGRAPHY: CATH118249

## 2017-10-31 LAB — RENAL FUNCTION PANEL
ANION GAP: 19 — AB (ref 5–15)
Albumin: 3.4 g/dL — ABNORMAL LOW (ref 3.5–5.0)
Albumin: 3.2 g/dL — ABNORMAL LOW (ref 3.5–5.0)
Anion gap: 19 — ABNORMAL HIGH (ref 5–15)
BUN: 61 mg/dL — ABNORMAL HIGH (ref 6–20)
BUN: 67 mg/dL — AB (ref 6–20)
CALCIUM: 8.6 mg/dL — AB (ref 8.9–10.3)
CHLORIDE: 90 mmol/L — AB (ref 101–111)
CHLORIDE: 91 mmol/L — AB (ref 101–111)
CO2: 23 mmol/L (ref 22–32)
CO2: 21 mmol/L — AB (ref 22–32)
CREATININE: 12.87 mg/dL — AB (ref 0.61–1.24)
Calcium: 8.4 mg/dL — ABNORMAL LOW (ref 8.9–10.3)
Creatinine, Ser: 11.85 mg/dL — ABNORMAL HIGH (ref 0.61–1.24)
GFR calc Af Amer: 5 mL/min — ABNORMAL LOW (ref >60)
GFR calc non Af Amer: 5 mL/min — ABNORMAL LOW (ref >60)
GFR calc non Af Amer: 4 mL/min — ABNORMAL LOW (ref >60)
GFR, EST AFRICAN AMERICAN: 5 mL/min — AB (ref >60)
GLUCOSE: 175 mg/dL — AB (ref 65–99)
Glucose, Bld: 174 mg/dL — ABNORMAL HIGH (ref 65–99)
POTASSIUM: 4.2 mmol/L (ref 3.5–5.1)
Phosphorus: 8.5 mg/dL — ABNORMAL HIGH (ref 2.5–4.6)
Phosphorus: 8.9 mg/dL — ABNORMAL HIGH (ref 2.5–4.6)
Potassium: 4.8 mmol/L (ref 3.5–5.1)
Sodium: 132 mmol/L — ABNORMAL LOW (ref 135–145)
Sodium: 131 mmol/L — ABNORMAL LOW (ref 135–145)

## 2017-10-31 LAB — GLUCOSE, CAPILLARY
GLUCOSE-CAPILLARY: 170 mg/dL — AB (ref 65–99)
Glucose-Capillary: 108 mg/dL — ABNORMAL HIGH (ref 65–99)
Glucose-Capillary: 132 mg/dL — ABNORMAL HIGH (ref 65–99)
Glucose-Capillary: 316 mg/dL — ABNORMAL HIGH (ref 65–99)

## 2017-10-31 LAB — CBC
HCT: 42.8 % (ref 39.0–52.0)
HCT: 39.9 % (ref 39.0–52.0)
HEMOGLOBIN: 14.5 g/dL (ref 13.0–17.0)
Hemoglobin: 13.5 g/dL (ref 13.0–17.0)
MCH: 29.4 pg (ref 26.0–34.0)
MCH: 29.1 pg (ref 26.0–34.0)
MCHC: 33.9 g/dL (ref 30.0–36.0)
MCHC: 33.8 g/dL (ref 30.0–36.0)
MCV: 86.6 fL (ref 78.0–100.0)
MCV: 86 fL (ref 78.0–100.0)
PLATELETS: 346 10*3/uL (ref 150–400)
Platelets: 403 10*3/uL — ABNORMAL HIGH (ref 150–400)
RBC: 4.94 MIL/uL (ref 4.22–5.81)
RBC: 4.64 MIL/uL (ref 4.22–5.81)
RDW: 15.9 % — ABNORMAL HIGH (ref 11.5–15.5)
RDW: 15.5 % (ref 11.5–15.5)
WBC: 7.7 10*3/uL (ref 4.0–10.5)
WBC: 5.6 10*3/uL (ref 4.0–10.5)

## 2017-10-31 LAB — HEPARIN LEVEL (UNFRACTIONATED): HEPARIN UNFRACTIONATED: 0.35 [IU]/mL (ref 0.30–0.70)

## 2017-10-31 LAB — POCT ACTIVATED CLOTTING TIME: Activated Clotting Time: 142 seconds

## 2017-10-31 SURGERY — LEFT HEART CATH AND CORONARY ANGIOGRAPHY
Anesthesia: LOCAL

## 2017-10-31 MED ORDER — HEPARIN (PORCINE) IN NACL 2-0.9 UNIT/ML-% IJ SOLN
INTRAMUSCULAR | Status: AC
  Filled 2017-10-31: qty 1000

## 2017-10-31 MED ORDER — LIDOCAINE HCL (PF) 1 % IJ SOLN
INTRAMUSCULAR | Status: AC
  Filled 2017-10-31: qty 30

## 2017-10-31 MED ORDER — DIAZEPAM 5 MG PO TABS: 5.0000 mg | ORAL_TABLET | Freq: Three times a day (TID) | ORAL | Status: DC | PRN

## 2017-10-31 MED ORDER — ACETAMINOPHEN 325 MG PO TABS: 650.0000 mg | ORAL_TABLET | ORAL | Status: DC | PRN

## 2017-10-31 MED ORDER — MIDAZOLAM HCL 2 MG/2ML IJ SOLN
INTRAMUSCULAR | Status: AC
  Filled 2017-10-31: qty 2

## 2017-10-31 MED ORDER — MIDAZOLAM HCL 2 MG/2ML IJ SOLN
INTRAMUSCULAR | Status: DC | PRN
  Administered 2017-10-31 (×2): 1 mg via INTRAVENOUS

## 2017-10-31 MED ORDER — HEPARIN (PORCINE) IN NACL 100-0.45 UNIT/ML-% IJ SOLN: 1200.0000 [IU]/h | INTRAMUSCULAR | Status: DC

## 2017-10-31 MED ORDER — SODIUM CHLORIDE 0.9 % IV SOLN: INTRAVENOUS | Status: DC

## 2017-10-31 MED ORDER — LIDOCAINE HCL (PF) 1 % IJ SOLN
INTRAMUSCULAR | Status: DC | PRN
  Administered 2017-10-31: 20 mL

## 2017-10-31 MED ORDER — SODIUM CHLORIDE 0.9% FLUSH: 3.0000 mL | INTRAVENOUS | Status: DC | PRN

## 2017-10-31 MED ORDER — HEPARIN (PORCINE) IN NACL 2-0.9 UNIT/ML-% IJ SOLN
INTRAMUSCULAR | Status: AC | PRN
  Administered 2017-10-31 (×2): 500 mL via INTRA_ARTERIAL

## 2017-10-31 MED ORDER — ASPIRIN 81 MG PO CHEW: 81.0000 mg | CHEWABLE_TABLET | ORAL | Status: DC

## 2017-10-31 MED ORDER — SODIUM CHLORIDE 0.9 % IV SOLN: 250.0000 mL | INTRAVENOUS | Status: DC | PRN

## 2017-10-31 MED ORDER — SODIUM CHLORIDE 0.9% FLUSH
3.0000 mL | Freq: Two times a day (BID) | INTRAVENOUS | Status: DC
  Administered 2017-10-31 – 2017-11-02 (×3): 3 mL via INTRAVENOUS

## 2017-10-31 MED ORDER — IOPAMIDOL (ISOVUE-370) INJECTION 76%
INTRAVENOUS | Status: AC
  Filled 2017-10-31: qty 100

## 2017-10-31 MED ORDER — NEPRO/CARBSTEADY PO LIQD
237.0000 mL | Freq: Two times a day (BID) | ORAL | Status: DC
  Administered 2017-11-02 – 2017-11-03 (×2): 237 mL via ORAL

## 2017-10-31 MED ORDER — ATORVASTATIN CALCIUM 80 MG PO TABS
80.0000 mg | ORAL_TABLET | Freq: Every day | ORAL | Status: DC
  Administered 2017-10-31 – 2017-11-03 (×4): 80 mg via ORAL
  Filled 2017-10-31 (×4): qty 1

## 2017-10-31 MED ORDER — CHLORHEXIDINE GLUCONATE CLOTH 2 % EX PADS
6.0000 | MEDICATED_PAD | Freq: Every day | CUTANEOUS | Status: DC
  Administered 2017-11-01 – 2017-11-03 (×3): 6 via TOPICAL

## 2017-10-31 MED ORDER — MUPIROCIN 2 % EX OINT
1.0000 "application " | TOPICAL_OINTMENT | Freq: Two times a day (BID) | CUTANEOUS | Status: DC
  Administered 2017-10-31 – 2017-11-04 (×7): 1 via NASAL
  Filled 2017-10-31: qty 22

## 2017-10-31 MED ORDER — SODIUM CHLORIDE 0.9% FLUSH: 3.0000 mL | Freq: Two times a day (BID) | INTRAVENOUS | Status: DC

## 2017-10-31 MED ORDER — HEPARIN (PORCINE) IN NACL 100-0.45 UNIT/ML-% IJ SOLN
1300.0000 [IU]/h | INTRAMUSCULAR | Status: DC
  Administered 2017-10-31: 1200 [IU]/h via INTRAVENOUS
  Administered 2017-11-01 – 2017-11-03 (×3): 1300 [IU]/h via INTRAVENOUS
  Filled 2017-10-31 (×5): qty 250

## 2017-10-31 MED ORDER — IOPAMIDOL (ISOVUE-370) INJECTION 76%
INTRAVENOUS | Status: DC | PRN
  Administered 2017-10-31: 80 mL via INTRA_ARTERIAL

## 2017-10-31 MED ORDER — FENTANYL CITRATE (PF) 100 MCG/2ML IJ SOLN
INTRAMUSCULAR | Status: AC
  Filled 2017-10-31: qty 2

## 2017-10-31 MED ORDER — ASPIRIN 81 MG PO CHEW
81.0000 mg | CHEWABLE_TABLET | Freq: Every day | ORAL | Status: DC
  Administered 2017-11-01 – 2017-11-03 (×3): 81 mg via ORAL
  Filled 2017-10-31 (×3): qty 1

## 2017-10-31 MED ORDER — ONDANSETRON HCL 4 MG/2ML IJ SOLN
4.0000 mg | Freq: Four times a day (QID) | INTRAMUSCULAR | Status: DC | PRN
  Administered 2017-11-02: 4 mg via INTRAVENOUS
  Filled 2017-10-31: qty 2

## 2017-10-31 MED ORDER — FENTANYL CITRATE (PF) 100 MCG/2ML IJ SOLN
INTRAMUSCULAR | Status: DC | PRN
  Administered 2017-10-31 (×2): 25 ug via INTRAVENOUS

## 2017-10-31 SURGICAL SUPPLY — 7 items
CATH INFINITI 5FR MULTPACK ANG (CATHETERS) ×1 IMPLANT
KIT HEART LEFT (KITS) ×2 IMPLANT
PACK CARDIAC CATHETERIZATION (CUSTOM PROCEDURE TRAY) ×2 IMPLANT
SHEATH AVANTI 11CM 5FR (SHEATH) ×1 IMPLANT
SYR MEDRAD MARK V 150ML (SYRINGE) ×2 IMPLANT
TRANSDUCER W/STOPCOCK (MISCELLANEOUS) ×2 IMPLANT
WIRE EMERALD 3MM-J .035X150CM (WIRE) ×1 IMPLANT

## 2017-10-31 NOTE — Consult Note (Addendum)
[address]       ,[place] [address]             [phone]        [name] Lee Cordle Sr. [place] Medical Record #[id number] Date of Birth: 05-May-1976  Referring: No ref. provider found Primary Care: Patient, [name] Per Primary Cardiologist:No primary care provider on file.  Chief Complaint:    Chief Complaint  Patient presents with  . Chest Pain    History of Present Illness:    Patient is a 42 year old male with a history of end-stage renal disease on hemodialysis Monday Wednesday Friday at [facility].  He also has a history of multiple cardiovascular risk factors including diabetes mellitus type 2, hypertension, coronary artery disease, combined systolic and diastolic dysfunction and medical noncompliance.  On 10/28/2017 the patient developed an episode of midsternal chest pain that he rated 10/10 with radiation down the right arm.  It was also associated with nausea, vomiting and shortness of breath.  He presented to the emergency department and had inferolateral ST depression on EKG.  He had a troponin of 0.7 and was admitted for further evaluation and treatment to include cardiology consultation.  The peak troponin was 0.10.  He was started on a heparin drip.  He underwent cardiac catheterization on today's date and findings are listed below as he is noted to have severe multivessel coronary artery disease.  We are asked to see the patient in cardiothoracic surgical consultation for consideration of CABG.  Nephrology is assisting with management including hemodialysis.    Current Activity/ Functional Status:  Has BKA (L)  Zubrod Score: At the time of surgery this patient's most appropriate activity status/level should be described as: []     0    Normal activity, no symptoms [x]     1    Restricted in physical strenuous activity but ambulatory, able to do out light work []     2    Ambulatory and capable of self care, unable to do work  activities, up and about                 more than 50%  Of the time                            []     3    Only limited self care, in bed greater than 50% of waking hours []     4    Completely disabled, no self care, confined to bed or chair []     5    Moribund  Past Medical History:  Diagnosis Date  . Anemia   . Atherosclerosis of lower extremity ([place])   . CAD (coronary artery disease)   . Chronic combined systolic and diastolic heart failure ([place])   . Complication of anesthesia   . ESRD (end stage renal disease) on dialysis [facility])    "MWF [facility]" (03/08/2017)  . GERD (gastroesophageal reflux disease)   . Heart murmur   . History of blood transfusion    "related to OR"  . Hypertension   . PONV (postoperative nausea and vomiting)   . Type II diabetes mellitus ([place])     Past Surgical History:  Procedure Laterality Date  . ABDOMINAL AORTOGRAM N/A 11/02/2016   Procedure: Abdominal Aortogram;  Surgeon: Waynetta Sandy, MD;  Location: [place] CV LAB;  Service: Cardiovascular;  Laterality: N/A;  .  AMPUTATION Left 09/27/2013   Procedure: LEFT GREAT TOE AMPUTATION;  Surgeon: [name], MD;  Location: [place];  Service: Orthopedics;  Laterality: Left;  . AMPUTATION Right 08/15/2015   Procedure: Right Great Toe Amputation;  Surgeon: [name], MD;  Location: [place];  Service: Orthopedics;  Laterality: Right;  . AMPUTATION Left 11/05/2016   Procedure: TRANSMETATARSAL AMPUTATION LEFT FOOT;  Surgeon: [name], MD;  Location: [place];  Service: Orthopedics;  Laterality: Left;  . AMPUTATION Left 03/11/2017   Procedure: LEFT BELOW KNEE AMPUTATION;  Surgeon: [name], MD;  Location: [place];  Service: Orthopedics;  Laterality: Left;  . AMPUTATION Right 03/11/2017   Procedure: RIGHT 2ND TOE AMPUTATION;  Surgeon: [name], MD;  Location: [place];  Service: Orthopedics;  Laterality: Right;  . AV FISTULA PLACEMENT  left arm  . LEFT HEART CATH AND CORONARY ANGIOGRAPHY N/A  10/31/2017   Procedure: LEFT HEART CATH AND CORONARY ANGIOGRAPHY;  Surgeon: Troy Sine, MD;  Location: [place] CV LAB;  Service: Cardiovascular;  Laterality: N/A;  . LEFT HEART CATHETERIZATION WITH CORONARY ANGIOGRAM N/A 09/13/2014   Procedure: LEFT HEART CATHETERIZATION WITH CORONARY ANGIOGRAM;  Surgeon: Sinclair Grooms, MD;  Location: [facility] CATH LAB;  Service: Cardiovascular;  Laterality: N/A;  . LOWER EXTREMITY ANGIOGRAPHY Bilateral 11/02/2016   Procedure: Lower Extremity Angiography;  Surgeon: Waynetta Sandy, MD;  Location: [place] CV LAB;  Service: Cardiovascular;  Laterality: Bilateral;  . PERIPHERAL VASCULAR ATHERECTOMY Left 11/02/2016   Procedure: Peripheral Vascular Atherectomy;  Surgeon: Waynetta Sandy, MD;  Location: [place] CV LAB;  Service: Cardiovascular;  Laterality: Left;  PERONEAL  . STUMP REVISION Left 01/11/2017   Procedure: Revision Left Transmetatarsal Amputation;  Surgeon: [name], MD;  Location: [place];  Service: Orthopedics;  Laterality: Left;    Social History   Tobacco Use  Smoking Status Current Every Day Smoker  . Packs/day: 0.50  . Years: 26.00  . Pack years: 13.00  . Types: Cigarettes  Smokeless Tobacco Never Used    Social History   Substance and Sexual Activity  Alcohol Use Yes   Comment: 03/08/2017 "haven't took a drink in over 5 years"    Social History   Socioeconomic History  . Marital status: Legally Separated    Spouse name: Not on file  . Number of children: Not on file  . Years of education: Not on file  . Highest education level: Not on file  Social Needs  . Financial resource strain: Not on file  . Food insecurity - worry: Not on file  . Food insecurity - inability: Not on file  . Transportation needs - medical: Not on file  . Transportation needs - non-medical: Not on file  Occupational History  . Not on file  Tobacco Use  . Smoking status: Current Every Day Smoker    Packs/day: 0.50     Years: 26.00    Pack years: 13.00    Types: Cigarettes  . Smokeless tobacco: Never Used  Substance and Sexual Activity  . Alcohol use: Yes    Comment: 03/08/2017 "haven't took a drink in over 5 years"  . Drug use: Yes    Types: Marijuana    Comment: 03/08/2017 "basically q day"   . Sexual activity: Yes  Other Topics Concern  . Not on file  Social History Narrative  . Not on file    Allergies  Allergen Reactions  . Coconut Oil Anaphylaxis  Can use topically, allergic to coconut foods     Current Facility-Administered Medications  Medication Dose Route Frequency Provider Last Rate Last Dose  . 0.9 %  sodium chloride infusion  100 mL Intravenous PRN Valentina Gu, NP      . 0.9 %  sodium chloride infusion  100 mL Intravenous PRN Valentina Gu, NP      . 0.9 %  sodium chloride infusion  250 mL Intravenous PRN Troy Sine, MD      . acetaminophen (TYLENOL) tablet 650 mg  650 mg Oral Q4H PRN Troy Sine, MD      . alteplase (CATHFLO ACTIVASE) injection 2 mg  2 mg Intracatheter Once PRN Valentina Gu, NP      . amLODipine (NORVASC) tablet 10 mg  10 mg Oral QHS Bland, Scott, DO   10 mg at 10/30/17 2134  . aspirin chewable tablet 324 mg  324 mg Oral Once Pisciotta, Nicole, PA-C      . [START ON 11/01/2017] aspirin chewable tablet 81 mg  81 mg Oral Daily Troy Sine, MD      . atorvastatin (LIPITOR) tablet 80 mg  80 mg Oral q1800 Troy Sine, MD      . calcitRIOL (ROCALTROL) capsule 1.25 mcg  1.25 mcg Oral Q M,W,F-HD Valentina Gu, NP      . calcium acetate (PHOSLO) capsule 1,334 mg  1,334 mg Oral TID WC Bland, Scott, DO   1,334 mg at 10/29/17 1807  . carvedilol (COREG) tablet 3.125 mg  3.125 mg Oral BID WC Mikell, Jeani Sow, MD   3.125 mg at 10/31/17 0818  . Chlorhexidine Gluconate Cloth 2 % PADS 6 each  6 each Topical Q0600 Hensel, Jamal Collin, MD      . diazepam (VALIUM) tablet 5 mg  5 mg Oral Q8H PRN Troy Sine, MD      . docusate  sodium (COLACE) capsule 100 mg  100 mg Oral QHS Bland, Scott, DO   100 mg at 10/29/17 2344  . feeding supplement (PRO-STAT SUGAR FREE 64) liquid 30 mL  30 mL Oral BID Bland, Scott, DO      . heparin ADULT infusion 100 units/mL (25000 units/255mL sodium chloride 0.45%)  1,200 Units/hr Intravenous Continuous Hensel, Jamal Collin, MD      . heparin injection 1,000 Units  1,000 Units Dialysis PRN Valentina Gu, NP      . insulin aspart (novoLOG) injection 0-9 Units  0-9 Units Subcutaneous TID WC Bland, Scott, DO   2 Units at 10/31/17 0818  . insulin glargine (LANTUS) injection 8 Units  8 Units Subcutaneous QHS Nicolette Bang, DO      . ipratropium-albuterol (DUONEB) 0.5-2.5 (3) MG/3ML nebulizer solution 3 mL  3 mL Nebulization TID PRN Sherene Sires, DO      . lamoTRIgine (LAMICTAL) tablet 150 mg  150 mg Oral QHS Bland, Scott, DO   150 mg at 10/30/17 2134  . lanthanum (FOSRENOL) chewable tablet 1,000 mg  1,000 mg Oral TID WC Mikell, Jeani Sow, MD   1,000 mg at 10/29/17 1806  . lidocaine (PF) (XYLOCAINE) 1 % injection 5 mL  5 mL Intradermal PRN Valentina Gu, NP      . lidocaine-prilocaine (EMLA) cream 1 application  1 application Topical PRN Valentina Gu, NP      . lisinopril (PRINIVIL,ZESTRIL) tablet 2.5 mg  2.5 mg Oral Daily Tonette Bihari, MD   2.5 mg at 10/30/17 7628  .  methocarbamol (ROBAXIN) tablet 500 mg  500 mg Oral Q6H PRN Sherene Sires, DO      . mupirocin ointment (BACTROBAN) 2 % 1 application  1 application Nasal BID Hensel, Jamal Collin, MD      . nicotine (NICODERM CQ - dosed in mg/24 hours) patch 14 mg  14 mg Transdermal Daily Bland, Scott, DO   14 mg at 10/30/17 0831  . nitroGLYCERIN (NITRODUR - Dosed in mg/24 hr) patch 0.4 mg  0.4 mg Transdermal Daily Bland, Scott, DO   0.4 mg at 10/30/17 0829  . nitroGLYCERIN (NITROSTAT) SL tablet 0.4 mg  0.4 mg Sublingual Q5 min PRN Sherene Sires, DO   0.4 mg at 10/29/17 0220  . ondansetron (ZOFRAN) injection 4 mg  4 mg  Intravenous Q6H PRN Troy Sine, MD      . ondansetron [facility]) tablet 4 mg  4 mg Oral Q6H PRN Sherene Sires, DO      . pentafluoroprop-tetrafluoroeth (GEBAUERS) aerosol 1 application  1 application Topical PRN Valentina Gu, NP      . polyethylene glycol (MIRALAX / GLYCOLAX) packet 17 g  17 g Oral Daily PRN Sherene Sires, DO      . sodium chloride flush (NS) 0.9 % injection 3 mL  3 mL Intravenous Q12H Bland, Scott, DO   3 mL at 10/30/17 2138  . sodium chloride flush (NS) 0.9 % injection 3 mL  3 mL Intravenous Q12H Shelva Majestic A, MD      . sodium chloride flush (NS) 0.9 % injection 3 mL  3 mL Intravenous PRN Troy Sine, MD        Medications Prior to Admission  Medication Sig Dispense Refill Last Dose  . acetaminophen (TYLENOL) 500 MG tablet Take 1,000 mg by mouth every 6 (six) hours as needed (for mild pain and CANNOT EXCEED 4,000 MG/24 HOURS).    Unk at [facility]  . amLODipine (NORVASC) 10 MG tablet Take 1 tablet (10 mg total) by mouth at bedtime. 30 tablet 0 10/27/2017 at 2100  . B Complex-C-Folic Acid (NEPHRO-VITE PO) Take 1 tablet by mouth at bedtime.   10/27/2017 at 2100  . calcitRIOL (ROCALTROL) 0.25 MCG capsule Take 9 capsules (2.25 mcg total) by mouth every Monday, Wednesday, and Friday with hemodialysis.   10/28/2017 at 0900  . calcium acetate (PHOSLO) 667 MG capsule Take 3 capsules (2,001 mg total) by mouth 3 (three) times daily with meals. (Patient taking differently: Take 1,334 mg by mouth 3 (three) times daily with meals. )   10/28/2017 at 0900  . clopidogrel (PLAVIX) 75 MG tablet Take 1 tablet (75 mg total) by mouth daily. 30 tablet 3 10/27/2017 at 1700  . docusate sodium (COLACE) 100 MG capsule Take 1 capsule (100 mg total) by mouth 2 (two) times daily. (Patient taking differently: Take 100 mg by mouth at bedtime. ) 10 capsule 0 10/27/2017 at 2100  . furosemide (LASIX) 20 MG tablet Take 20 mg by mouth 2 (two) times daily.   10/28/2017 at 0800  . glucagon (GLUCAGON EMERGENCY) 1  MG injection Inject 1 mg into the vein once as needed (low blood sugar and inability to eat or drink sugar). 1 each 0 Unk at [facility]  . insulin aspart (NOVOLOG) 100 UNIT/ML injection Sliding scale CBG 70 - 120: 0 units CBG 121 - 150: 1 unit,  CBG 151 - 200: 2 units,  CBG 201 - 250: 3 units,  CBG 251 - 300: 5 units,  CBG 301 - 350: 7  units,  CBG 351 - 400: 9 units   CBG > 400: 9 units and notify your MD (Patient taking differently: Inject 0-9 Units into the skin See admin instructions. Inject 0-9 units into the skin two times a day per sliding scale: BGL 70-120 = 0 units; 121-150 = 1 unit; 151-200 = 2 units; 201-250 = 3 units; 251-300 = 5 units; 301-350 = 7 units; 351-400 = 9 units; >400 = 9 units and call MD) 10 mL 11 10/28/2017 at 0900  . insulin glargine (LANTUS) 100 UNIT/ML injection Inject 0.07 mLs (7 Units total) into the skin at bedtime. 10 mL 12 10/27/2017 at 2100  . ipratropium-albuterol (DUONEB) 0.5-2.5 (3) MG/3ML SOLN Take 3 mLs by nebulization 3 (three) times daily as needed (for shortness of breath).    Unk at [facility]  . lamoTRIgine (LAMICTAL) 150 MG tablet Take 150 mg by mouth at bedtime.   10/27/2017 at 2100  . lanthanum (FOSRENOL) 1000 MG chewable tablet Chew 2 tablets (2,000 mg total) by mouth 3 (three) times daily with meals. 180 tablet 0 10/28/2017 at 0900  . methocarbamol (ROBAXIN) 500 MG tablet Take 1 tablet (500 mg total) by mouth every 6 (six) hours as needed for muscle spasms.   Unk at [facility]  . Nutritional Supplements (BOOST GLUCOSE CONTROL) LIQD Take 8 oz by mouth as directed.   Unk at [facility]  . ondansetron (ZOFRAN) 4 MG tablet Take 4 mg by mouth every 6 (six) hours as needed for nausea.    Unk at [facility]  . polyethylene glycol (MIRALAX / GLYCOLAX) packet Take 17 g by mouth daily. (Patient taking differently: Take 17 g by mouth daily as needed for mild constipation. ) 14 each 0 Unk at [facility]  . Amino Acids-Protein Hydrolys (FEEDING SUPPLEMENT, PRO-STAT SUGAR FREE 64,) LIQD Take 30 mLs by mouth 2 (two)  times daily. (Patient not taking: Reported on 10/28/2017) 900 mL 0 Not Taking at Unknown time  . carvedilol (COREG) 6.25 MG tablet Take 1 tablet (6.25 mg total) by mouth 2 (two) times daily with a meal. (Patient not taking: Reported on 10/28/2017)   Not Taking at Unknown time  . fentaNYL (DURAGESIC - DOSED MCG/HR) 12 MCG/HR Place 1 patch (12.5 mcg total) onto the skin every 3 (three) days. (Patient not taking: Reported on 10/28/2017) 5 patch 0 Not Taking at Unknown time  . multivitamin (RENA-VIT) TABS tablet Take 1 tablet by mouth at bedtime. (Patient not taking: Reported on 10/28/2017)  0 Not Taking at Unknown time  . nicotine (NICODERM CQ - DOSED IN MG/24 HOURS) 21 mg/24hr patch Place 1 patch (21 mg total) onto the skin daily. (Patient not taking: Reported on 10/28/2017) 28 patch 0 Not Taking at Unknown time  . oxyCODONE (OXY IR/ROXICODONE) 5 MG immediate release tablet Take 1-2 tablets (5-10 mg total) by mouth every 4 (four) hours as needed for breakthrough pain. (Patient not taking: Reported on 10/28/2017) 20 tablet 0 Not Taking at Unknown time    Family History  Problem Relation Age of Onset  . Heart failure Mother   . Hypertension Mother        Review of Systems  Constitutional: Positive for malaise/fatigue and weight loss. Negative for diaphoresis.  HENT: Negative for congestion, ear discharge, ear pain, hearing loss, nosebleeds, sinus pain, sore throat and tinnitus.   Eyes: Negative.   Respiratory: Positive for shortness of breath. Negative for cough, hemoptysis, sputum production, wheezing and stridor.   Cardiovascular: Positive for chest pain and palpitations. Negative for  orthopnea, claudication, leg swelling and PND.  Gastrointestinal: Positive for abdominal pain, constipation, heartburn, nausea and vomiting. Negative for blood in stool, diarrhea and melena.  Genitourinary: Negative.        Doesn't make any urine  Musculoskeletal: Positive for back pain. Negative for falls, joint  pain, myalgias and neck pain.       Has prosthetic LLE, no assist device  Skin: Positive for rash.  Neurological: Positive for weakness. Negative for dizziness, tingling, tremors, sensory change, speech change, focal weakness, seizures, loss of consciousness and headaches.  Endo/Heme/Allergies: Negative for environmental allergies and polydipsia. Does not bruise/bleed easily.  Psychiatric/Behavioral: Positive for depression. Negative for hallucinations, memory loss, substance abuse and suicidal ideas. The patient is nervous/anxious and has insomnia.      Physical Exam: BP 135/81   Pulse (!) 58   Temp 98.7 F (37.1 C) (Oral)   Resp (!) 9   Ht 6\' 2"  (1.88 m)   Wt 174 lb 3.2 oz (79 kg)   SpO2 (!) 0%   BMI 22.37 kg/m   Physical Exam  Constitutional: He is oriented to person, place, and time. He appears well-developed. No distress.  HENT:  Head: Normocephalic and atraumatic.  Mouth/Throat: Oropharynx is clear and moist. No oropharyngeal exudate.  Poor dentition  Eyes:  conjunctiva muddy and injected  Cardiovascular: Normal rate and regular rhythm. Exam reveals gallop. Exam reveals no friction rub.  No murmur heard. Absent right pedal pulses, LBKA Left carotid bruit  Pulmonary/Chest: Effort normal and breath sounds normal. No respiratory distress. He has no wheezes. He has no rales. He exhibits no tenderness.  Abdominal: Soft. Bowel sounds are normal. He exhibits no distension and no mass. There is tenderness. There is no rebound and no guarding.  Diffuse minor tenderness, moreso LLQ  Genitourinary:  Genitourinary Comments: Not examined  Musculoskeletal: He exhibits no edema, tenderness or deformity.  Neurological: He is alert and oriented to person, place, and time. No cranial nerve deficit. He exhibits normal muscle tone.  Ambulates with prosthesis  Skin: Skin is warm and dry. He is not diaphoretic. No erythema.  Some acne scars on chest  Psychiatric: He has a normal mood and  affect. His behavior is normal. Judgment and thought content normal.  Somewhat sedated from meds during cath   Diagnostic Studies & Laboratory data:     Recent Radiology Findings:   No results found.   I have independently reviewed the above radiologic studies.  Recent Lab Findings: Lab Results  Component Value Date   WBC 7.7 10/31/2017   HGB 14.5 10/31/2017   HCT 42.8 10/31/2017   PLT 403 (H) 10/31/2017   GLUCOSE 174 (H) 10/31/2017   CHOL 187 10/29/2017   TRIG 266 (H) 10/29/2017   HDL 35 (L) 10/29/2017   LDLCALC 99 10/29/2017   ALT 19 10/28/2017   AST 18 10/28/2017   NA 132 (L) 10/31/2017   K 4.2 10/31/2017   CL 90 (L) 10/31/2017   CREATININE 11.85 (H) 10/31/2017   BUN 61 (H) 10/31/2017   CO2 23 10/31/2017   TSH 1.011 10/29/2017   INR 1.00 10/30/2017   HGBA1C 12.1 (H) 10/29/2017   LEFT HEART CATH AND CORONARY ANGIOGRAPHY  Conclusion     Ost 2nd Diag to 2nd Diag lesion is 99% stenosed.  Prox LAD to Mid LAD lesion is 99% stenosed.  Ost 2nd Mrg to 2nd Mrg lesion is 80% stenosed.  Mid Cx to Dist Cx lesion is 100% stenosed.  Ost RCA to  Prox RCA lesion is 20% stenosed.  Mid RCA lesion is 40% stenosed.  Dist RCA-1 lesion is 90% stenosed.  Dist RCA-2 lesion is 80% stenosed.  The left ventricular systolic function is normal.  LV end diastolic pressure is normal.   Severe multivessel CAD with coronary calcification involving all major coronary arteries.   There is 99% subtotal occlusion of a small second diagonal branch of the LAD.  The mid LAD is very tortuous and has diffuse 95% nodular very calcified stenoses in the mid segment; the circumflex vessel has 80% marginal stenosis and is occluded after this marginal takeoff with collateralization retrograde distally; and a large dominant calcified RCA with 20% proximal 40% mid, and focal 90 and 80% stenoses after the acute margin and before the PDA takeoff.  Mild LV dysfunction with an ejection fraction of 40 to  less than 45% with distal anterolateral apical hypocontractility.  LVEDP 5-7 mm.  RECOMMENDATION: With the extensive coronary calcification and multivessel CAD recommend surgical consultation for CABG revascularization.   Indications   Coronary artery disease involving native coronary artery of native heart with unstable angina pectoris ([place]) [I25.110 (ICD-10-CM)]  Procedural Details/Technique   Technical Details Mr. Jemal Miskell is a 42 year old African-American male with end-stage renal disease who has been on dialysis since age 59. He has significant peripheral vascular disease and is status post left BKA, and has had numerous toes amputated. Cardiac catheterization in 2016 showed significant cardiac calcification with distal LAD disease, treated medically. He developed worsening symptoms of chest pain. Troponins are minimally positive. He is felt to have unstable angina/non-STEMI. He developed progressive inferolateral T wave abnormalities and is now referred for cardiac catheterization.  The patient arrived to the catheterization laboratory in the fasting state. He was premedicated with Versed and fentanyl. His right femoral artery was punctured anteriorly without difficulty and a 5 French sheath was inserted. Diagnostic catheterization was done utilizing 5 French Judkins 4 left and right coronary catheters. A 5 French pigtail catheter was used for left ventriculography. Hemostasis was obtained by direct Brenda pressure. The patient tolerated the procedure well and returned to his room in satisfactory condition.   Estimated blood loss <50 mL.  During this procedure the patient was administered the following to achieve and maintain moderate conscious sedation: Versed 2 mg, Fentanyl 50 mcg, while the patient's heart rate, blood pressure, and oxygen saturation were continuously monitored. The period of conscious sedation was 21 minutes, of which I was present face-to-face 100% of this time.    Coronary Findings   Diagnostic  Dominance: Right  Left Anterior Descending  Prox LAD to Mid LAD lesion 99% stenosed  Prox LAD to Mid LAD lesion is 99% stenosed.  Second Diagonal [employer] 2nd Diag to 2nd Diag lesion 99% stenosed  Ost 2nd Diag to 2nd Diag lesion is 99% stenosed.  Left Circumflex  Collaterals  Dist Cx filled by collaterals from 2nd Mrg.    Mid Cx to Dist Cx lesion 100% stenosed  Mid Cx to Dist Cx lesion is 100% stenosed.  Second Obtuse Marginal Branch  Ost 2nd Mrg to 2nd Mrg lesion 80% stenosed  Ost 2nd Mrg to 2nd Mrg lesion is 80% stenosed.  Right Coronary Artery  Ost RCA to Prox RCA lesion 20% stenosed  Ost RCA to Prox RCA lesion is 20% stenosed.  Mid RCA lesion 40% stenosed  Mid RCA lesion is 40% stenosed.  Dist RCA-1 lesion 90% stenosed  Dist RCA-1 lesion is 90% stenosed.  Dist RCA-2  lesion 80% stenosed  Dist RCA-2 lesion is 80% stenosed.  Intervention   No interventions have been documented.  Wall Motion              Left Heart   Left Ventricle The left ventricular size is normal. The left ventricular systolic function is normal. LV end diastolic pressure is normal. No regional wall motion abnormalities. LV size is normal. There is mild distal anterolateral apical hypocontractility. Global ejection fraction is in the 40 to less than 45% range.  Coronary Diagrams   Diagnostic Diagram                                    *[facility]*                         [address], [place] [address]                            [phone]  ------------------------------------------------------------------- Transthoracic Echocardiography  Patient:    [name], [name] MR #:       [id number] Study Date: 10/30/2017 Gender:     M Age:        40 Height:     188 cm Weight:     76.7 kg BSA:        2 m^2 Pt. 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  ------------------------------------------------------------------- History:   PMH:  DM, ESRD, GERD.  Murmur.  Coronary artery disease.  Risk factors:  Hypertension.  ------------------------------------------------------------------- Study Conclusions  - Left ventricle: The cavity size was normal. Wall thickness was   increased in a pattern of moderate to severe LVH. Systolic   function was normal. The estimated ejection fraction was in the   range of 50% to 55%. Wall motion was normal; there were no   regional wall motion abnormalities. Doppler parameters are   consistent with abnormal left ventricular relaxation (grade 1   diastolic dysfunction). - Mitral valve: Moderately calcified annulus.  ------------------------------------------------------------------- Study data:  Comparison was made to the study of 09/15/2014.  Study status:  Routine.  Procedure:  The patient reported no pain pre or post test. Transthoracic echocardiography. Image quality was adequate.  Study completion:  There were no complications. Transthoracic echocardiography.  M-mode, complete 2D, spectral Doppler, and color Doppler.  Birthdate:  Patient birthdate: May 16, 1976.  Age:  Patient is 42 yr old.  Sex:  Gender: male. BMI: 21.7 kg/m^2.  Blood pressure:     120/63  Patient status: Inpatient.  Study date:  Study date: 10/30/2017. Study time: 10:34 AM.  Location:  Echo  laboratory.  -------------------------------------------------------------------  ------------------------------------------------------------------- Left ventricle:  The cavity size was normal. Wall thickness  was increased in a pattern of moderate to severe LVH. Systolic function was normal. The estimated ejection fraction was in the range of 50% to 55%. Wall motion was normal; there were no regional wall motion abnormalities. Doppler parameters are consistent with abnormal left ventricular relaxation (grade 1 diastolic dysfunction).  ------------------------------------------------------------------- Aortic valve:   Trileaflet; normal thickness leaflets. Mobility was not restricted.  Doppler:  Transvalvular velocity was within the normal range. There was no stenosis. There was no regurgitation.   ------------------------------------------------------------------- Aorta:  Aortic root: The aortic root was normal in size.  ------------------------------------------------------------------- Mitral valve:   Moderately calcified annulus. Mobility was not restricted.  Doppler:  Transvalvular velocity was within the normal range. There was no evidence for stenosis. There was no regurgitation.  ------------------------------------------------------------------- Left atrium:  The atrium was normal in size.  ------------------------------------------------------------------- Right ventricle:  The cavity size was normal. Wall thickness was normal. Systolic function was normal.  ------------------------------------------------------------------- Pulmonic valve:    The valve appears to be grossly normal. Doppler:  Transvalvular velocity was within the normal range. There was no evidence for stenosis.  ------------------------------------------------------------------- Tricuspid valve:   Structurally normal valve.    Doppler: Transvalvular velocity was within the normal range.  There was mild regurgitation.  ------------------------------------------------------------------- Pulmonary artery:   The main pulmonary artery was normal-sized. Systolic pressure was within the normal range.  ------------------------------------------------------------------- Right atrium:  The atrium was normal in size.  ------------------------------------------------------------------- Pericardium:  There was no pericardial effusion.  ------------------------------------------------------------------- Systemic veins: Inferior vena cava: The vessel was normal in size.  ------------------------------------------------------------------- Measurements   Left ventricle                          Value        Reference  LV ID, ED, PLAX chordal                 46    mm     43 - 52  LV ID, ES, PLAX chordal                 30    mm     23 - 38  LV fx shortening, PLAX chordal          35    %      >=29  LV PW thickness, ED                     13    mm     ----------  IVS/LV PW ratio, ED                     1.08         <=1.3  Stroke volume, 2D                       59    ml     ----------  Stroke volume/bsa, 2D                   30    ml/m^2 ----------  LV e&', lateral                          5.66  cm/s   ----------  LV E/e&', lateral                        8.23         ----------  LV e&', medial                           3.05  cm/s   ----------  LV E/e&', medial                         15.28        ----------  LV e&', average                          4.36  cm/s   ----------  LV E/e&', average                        10.7         ----------    Ventricular septum                      Value        Reference  IVS thickness, ED                       14    mm     ----------    LVOT                                    Value        Reference  LVOT ID, S                              22    mm     ----------  LVOT area                               3.8   cm^2   ----------  LVOT peak  velocity, S                   99    cm/s   ----------  LVOT mean velocity, S                   68.7  cm/s   ----------  LVOT VTI, S                             15.4  cm     ----------  LVOT peak gradient, S                   4     mm Hg  ----------    Aorta                                   Value        Reference  Aortic root ID, ED                      32    mm     ----------    Left atrium                             Value  Reference  LA ID, A-P, ES                          39    mm     ----------  LA ID/bsa, A-P                          1.95  cm/m^2 <=2.2  LA volume, S                            50.7  ml     ----------  LA volume/bsa, S                        25.4  ml/m^2 ----------  LA volume, ES, 1-p A4C                  41.4  ml     ----------  LA volume/bsa, ES, 1-p A4C              20.8  ml/m^2 ----------  LA volume, ES, 1-p A2C                  57.6  ml     ----------  LA volume/bsa, ES, 1-p A2C              28.9  ml/m^2 ----------    Mitral valve                            Value        Reference  Mitral E-wave peak velocity             46.6  cm/s   ----------  Mitral A-wave peak velocity             65.5  cm/s   ----------  Mitral deceleration time                180   ms     150 - 230  Mitral E/A ratio, peak                  0.7          ----------    Pulmonary arteries                      Value        Reference  PA pressure, S, DP                      21    mm Hg  <=30    Tricuspid valve                         Value        Reference  Tricuspid regurg peak velocity          215   cm/s   ----------  Tricuspid peak RV-RA gradient           18    mm Hg  ----------    Right atrium                            Value        Reference  RA ID, S-I, ES, A4C             (  H)     49.3  mm     34 - 49  RA area, ES, A4C                        15.4  cm^2   8.3 - 19.5  RA volume, ES, A/L                      39.1  ml     ----------  RA volume/bsa, ES, A/L                  19.6   ml/m^2 ----------    Systemic veins                          Value        Reference  Estimated CVP                           3     mm Hg  ----------    Right ventricle                         Value        Reference  RV ID, minor axis, ED, A4C base         24    mm     ----------  TAPSE                                   24.3  mm     ----------  RV pressure, S, DP                      21    mm Hg  <=30  RV s&', lateral, S                       7.62  cm/s   ----------  Legend: (L)  and  (H)  mark values outside specified reference range.  ------------------------------------------------------------------- Prepared and Electronically Authenticated by  Ezzard Standing, MD [facility] 2019-03-17T17:10:34  Assessment / Plan:  Severe 3 vessel dz , HFpEF, ACS- currently in dialysis without pain. Multiple co-morbidities and Cardiac risk factors   Plan: to be seen by surgeon for consideration of high-risk CABG        I  spent 55 minutes counseling the patient face to face.   John Giovanni, PA-C 10/31/2017 12:10 PM Patient seen and examined. 42 yo man with type 1 diabetes complicated by nephropathy, neuropathy and vasculopathy. 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 1 month history of CP, thought it was indigestion at first but Friday had a severe episode leading to admission. Troponin slightly elevated c/w non-STEMI. 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 CABG indicated for survival benefit and relief of symptoms. He is high risk for complications.  I discussed the general nature of the procedure, including the need for general anesthesia, the use of cardiopulmonary bypass, and incisions to be used with Mr. Ribaudo. We discussed the expected hospital stay, overall recovery and short and long term outcomes. I informed him of the indications, risks, benefits and alternatives. He understands the risks include, but are not limited to death, stroke, MI, DVT/PE, bleeding, possible need for transfusion, infections, cardiac  arrhythmias, as  well as other organ system dysfunction including respiratory or GI complications or risk to limb.   He accepts the risks and agrees to proceed.  He has been on Plavix. Ideally would wait a week for plavix effect to wear off particularly in light of his renal failure. However, I do not think he will be able to wait that long. For now will plan OR Friday. That will be 4 days off plavix. Bleeding risk will be improved, although still higher than normal.   He had a brief episode of CP overnight. Hopefully with heparin drip that will settle out. If he continues to have pain, may need to have surgery sooner, with substantially higher risk of bleeding.  Revonda Standard Roxan Hockey, MD [facility] [phone]

## 2017-10-31 NOTE — Interval H&P Note (Signed)
Cath Lab Visit (complete for each Cath Lab visit)  Clinical Evaluation Leading to the Procedure:   ACS: No.  Non-ACS:    Anginal Classification: CCS IV  Anti-ischemic medical therapy: Maximal Therapy (2 or more classes of medications)  Non-Invasive Test Results: No non-invasive testing performed  Prior CABG: No previous CABG      History and Physical Interval Note:  10/31/2017 8:56 AM  [name] Labella Sr.  has presented today for surgery, with the diagnosis of NSTEMI  The various methods of treatment have been discussed with the patient and family. After consideration of risks, benefits and other options for treatment, the patient has consented to  Procedure(s): LEFT HEART CATH AND CORONARY ANGIOGRAPHY (N/A) as a surgical intervention .  The patient's history has been reviewed, patient examined, no change in status, stable for surgery.  I have reviewed the patient's chart and labs.  Questions were answered to the patient's satisfaction.     Shelva Majestic

## 2017-10-31 NOTE — Consult Note (Signed)
[facility] Nurse wound consult note Reason for Consult:Nonhealing wound at right second metatarsal amputation site.   Wound type: nonhealing surgical wound, toe amputation site. Pressure Injury POA: NA Measurement: 2.2 cm x 0.6 cm x 0.2 cm  Wound ATF:TDDUK red, clean Drainage (amount, consistency, odor) minimal serosanguinous  No odor Periwound:amputation site:  Right first metatarsal, healed and intact Left AKA, intact, no breakdown present.  Patient wears prosthesis Dressing procedure/placement/frequency: CLeanse wound to right second metatarsal with NS.  Gently fill with Iodoform packing strip.  Cover with foam.  Change daily.   Will not follow at this time.  Please re-consult if needed.  Domenic Moras RN BSN [place] Pager [phone]

## 2017-10-31 NOTE — NC FL2 (Signed)
[place] MEDICAID FL2 LEVEL OF CARE SCREENING TOOL     IDENTIFICATION  Patient Name: [name] Mcphillips Sr. Birthdate: January 16, 1976 Sex: male Admission Date (Current Location): 10/28/2017  [facility] and [place] Number:  Herbalist and Address:  [facility] [place] [place] [address] [place], [place] [address]      Provider Number: [id number]  Attending Physician Name and Address:  Zenia Resides, MD  Relative Name and Phone Number:  Jupiter Kabir, sister, [phone]    Current Level of Care: Hospital Recommended Level of Care: [place] Prior Approval Number:    Date Approved/Denied:   PASRR Number: [id number]  Discharge Plan: SNF    Current Diagnoses: Patient Active Problem List   Diagnosis Date Noted  . ACS (acute coronary syndrome) ([place]) 10/28/2017  . Flash pulmonary edema ([place]) 06/04/2017  . Hypertensive emergency 06/04/2017  . 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 S/P unilateral BKA (below knee amputation), left ([place])   . Post-operative pain   . Acute blood loss anemia   . Chronic combined systolic and diastolic CHF (congestive heart failure) ([place])   . PVD (peripheral vascular disease) ([place])   . Coronary artery disease involving native coronary artery of native heart with unstable angina pectoris ([place])   . Tobacco abuse   . Diabetic foot infection ([place]) 03/08/2017  . Osteomyelitis ([place]) 03/08/2017  . Wound dehiscence   . Diabetes mellitus with complication ([place])   . Dehiscence of amputation stump ([place]) 01/04/2017  . S/P transmetatarsal amputation of foot, left ([place]) 11/16/2016  . Gangrene of right foot ([place]) 08/02/2016  . Achilles tendon contracture, left 08/02/2016  . Anemia of renal disease 08/16/2015  . Wound infection 08/15/2015  . Diabetic ulcer of right  great toe ([place]) 08/15/2015  . Pain in the chest   . Elevated troponin   . Essential hypertension   . ESRD on dialysis ([place]) 08/07/2013  . Diabetes ([place]) 08/07/2013  . Chest pain 05/16/2012  . Murmur 05/16/2012  . Renal disorder     Orientation RESPIRATION BLADDER Height & Weight     Self, Time, Situation, Place  Normal Continent Weight: 174 lb 3.2 oz (79 kg) Height:  6\' 2"  (188 cm)  BEHAVIORAL SYMPTOMS/MOOD NEUROLOGICAL BOWEL NUTRITION STATUS      Continent Diet(please see DC summary)  AMBULATORY STATUS COMMUNICATION OF NEEDS Skin   (baseline) Verbally Other (Comment)(open wound, R foot)                       Personal Care Assistance Level of Assistance  (baseline)           Functional Limitations Info  Sight, Hearing, Speech Sight Info: Adequate Hearing Info: Adequate Speech Info: Adequate    SPECIAL CARE FACTORS FREQUENCY                       Contractures Contractures Info: Not present    Additional Factors Info  Code Status, Allergies, Isolation Precautions, Insulin Sliding Scale Code Status Info: Full Allergies Info: Coconut Oil   Insulin Sliding Scale Info: insulin novolog 3x/day with meals, lantus at bedtime Isolation Precautions Info: Contact Precautions, MRSA     Current Medications (10/31/2017):  This is the current hospital active medication list Current Facility-Administered Medications  Medication Dose Route Frequency Provider Last Rate Last Dose  .  0.9 %  sodium chloride infusion  100 mL Intravenous PRN Valentina Gu, NP      . 0.9 %  sodium chloride infusion  100 mL Intravenous PRN Valentina Gu, NP      . 0.9 %  sodium chloride infusion  250 mL Intravenous PRN Troy Sine, MD      . acetaminophen (TYLENOL) tablet 650 mg  650 mg Oral Q4H PRN Troy Sine, MD      . alteplase (CATHFLO ACTIVASE) injection 2 mg  2 mg Intracatheter Once PRN Valentina Gu, NP      . amLODipine (NORVASC) tablet 10 mg  10 mg Oral  QHS Bland, Scott, DO   10 mg at 10/30/17 2134  . aspirin chewable tablet 324 mg  324 mg Oral Once Pisciotta, Nicole, PA-C      . [START ON 11/01/2017] aspirin chewable tablet 81 mg  81 mg Oral Daily Troy Sine, MD      . atorvastatin (LIPITOR) tablet 80 mg  80 mg Oral q1800 Troy Sine, MD      . calcitRIOL (ROCALTROL) capsule 1.25 mcg  1.25 mcg Oral Q M,W,F-HD Valentina Gu, NP      . calcium acetate (PHOSLO) capsule 1,334 mg  1,334 mg Oral TID WC Bland, Scott, DO   1,334 mg at 10/29/17 1807  . carvedilol (COREG) tablet 3.125 mg  3.125 mg Oral BID WC Mikell, Jeani Sow, MD   3.125 mg at 10/31/17 0818  . Chlorhexidine Gluconate Cloth 2 % PADS 6 each  6 each Topical Q0600 Hensel, Jamal Collin, MD      . diazepam (VALIUM) tablet 5 mg  5 mg Oral Q8H PRN Troy Sine, MD      . docusate sodium (COLACE) capsule 100 mg  100 mg Oral QHS Bland, Scott, DO   100 mg at 10/29/17 2344  . feeding supplement (PRO-STAT SUGAR FREE 64) liquid 30 mL  30 mL Oral BID Bland, Scott, DO      . heparin ADULT infusion 100 units/mL (25000 units/258mL sodium chloride 0.45%)  1,200 Units/hr Intravenous Continuous Hensel, Jamal Collin, MD      . heparin injection 1,000 Units  1,000 Units Dialysis PRN Valentina Gu, NP      . insulin aspart (novoLOG) injection 0-9 Units  0-9 Units Subcutaneous TID WC Bland, Scott, DO   2 Units at 10/31/17 0818  . insulin glargine (LANTUS) injection 8 Units  8 Units Subcutaneous QHS Nicolette Bang, DO      . ipratropium-albuterol (DUONEB) 0.5-2.5 (3) MG/3ML nebulizer solution 3 mL  3 mL Nebulization TID PRN Sherene Sires, DO      . lamoTRIgine (LAMICTAL) tablet 150 mg  150 mg Oral QHS Bland, Scott, DO   150 mg at 10/30/17 2134  . lanthanum (FOSRENOL) chewable tablet 1,000 mg  1,000 mg Oral TID WC Mikell, Jeani Sow, MD   1,000 mg at 10/29/17 1806  . lidocaine (PF) (XYLOCAINE) 1 % injection 5 mL  5 mL Intradermal PRN Valentina Gu, NP      .  lidocaine-prilocaine (EMLA) cream 1 application  1 application Topical PRN Valentina Gu, NP      . lisinopril (PRINIVIL,ZESTRIL) tablet 2.5 mg  2.5 mg Oral Daily Tonette Bihari, MD   2.5 mg at 10/30/17 3976  . methocarbamol (ROBAXIN) tablet 500 mg  500 mg Oral Q6H PRN Sherene Sires, DO      . mupirocin ointment (BACTROBAN)  2 % 1 application  1 application Nasal BID Hensel, Jamal Collin, MD      . nicotine (NICODERM CQ - dosed in mg/24 hours) patch 14 mg  14 mg Transdermal Daily Bland, Scott, DO   14 mg at 10/30/17 0831  . nitroGLYCERIN (NITRODUR - Dosed in mg/24 hr) patch 0.4 mg  0.4 mg Transdermal Daily Bland, Scott, DO   0.4 mg at 10/30/17 0829  . nitroGLYCERIN (NITROSTAT) SL tablet 0.4 mg  0.4 mg Sublingual Q5 min PRN Sherene Sires, DO   0.4 mg at 10/29/17 0220  . ondansetron (ZOFRAN) injection 4 mg  4 mg Intravenous Q6H PRN Troy Sine, MD      . ondansetron [facility]) tablet 4 mg  4 mg Oral Q6H PRN Sherene Sires, DO      . pentafluoroprop-tetrafluoroeth (GEBAUERS) aerosol 1 application  1 application Topical PRN Valentina Gu, NP      . polyethylene glycol (MIRALAX / GLYCOLAX) packet 17 g  17 g Oral Daily PRN Sherene Sires, DO      . sodium chloride flush (NS) 0.9 % injection 3 mL  3 mL Intravenous Q12H Bland, Scott, DO   3 mL at 10/30/17 2138  . sodium chloride flush (NS) 0.9 % injection 3 mL  3 mL Intravenous Q12H Shelva Majestic A, MD      . sodium chloride flush (NS) 0.9 % injection 3 mL  3 mL Intravenous PRN Troy Sine, MD         Discharge Medications: Please see discharge summary for a list of discharge medications.  Relevant Imaging Results:  Relevant Lab Results:   Additional Information SSN: [id number]; Dialysis patient MWF at [facility], [place]

## 2017-10-31 NOTE — Progress Notes (Addendum)
[facility] Progress Note   Dialysis Orders: [facility] MWF 4 hrs 200 NRe 500/800 73 kg 2.0K/2.0 Ca UFP 2 -Heparin 2400 unit IV TIW -Calcitriol 1.25 mcg PO TIW -Venofer 50 mg IV weekly (last dose 10/26/17)   Assessment/Plan: 1. Chest pain: ACS vs demand ischemia.Troponin peak .10. ST seg depression noted on EKG inferior laterally. Had heart cath this am. Full report pending - pt said he might need to have CABG  2. ESRD - MWF. HD today k 4.2 - use 3 k bath 3. Hypertension/volume -Has not been getting to EDW as OP. BP dropping. EDW recently increased. HD 10/29/17 pre wt 81 kg Net UF 4.0 liters Post wt 77 kg. Continue lowering volume  - not getting to edw as output - may need to raise further 4. Anemia - HGB 14.5 No ESA. 5. Metabolic bone disease - Ca 8.2 Phos 5.9 Contiue VDRA/binders 6. Nutrition - Albumin 3.6 Renal/Carb Mod diet, renal vit, nepro 7.  DM per primary 8.  Disp - patient living at [facility] since last August  Martha B Bergman, PA-C [place] [phone] 10/31/2017,10:28 AM  LOS: 3 days   Pt seen, examined and agree w A/P as above.  Kelly Splinter MD [facility] pager [phone]   10/31/2017, 4:48 PM    Subjective:   "Might need to have a heart bypass  Objective Vitals:   10/31/17 1000 10/31/17 1005 10/31/17 1005 10/31/17 1021  BP: (!) 146/81  (!) 136/45 135/81  Pulse: 60 (!) 57 (!) 58   Resp: 13 (!) 9 (!) 9   Temp:      TempSrc:      SpO2: 100% (!) 0% (!) 0%   Weight:      Height:       Physical Exam General: NAD Heart: RRR Lungs: no rales Abdomen: soft NT Extremities: no LE edema  Dialysis Access: left AVF + bruit  Additional Objective Labs: Basic Metabolic Panel: Recent Labs  Lab 10/29/17 1900 10/30/17 0412 10/31/17 0614  NA 130* 133* 132*  K 4.5 3.9 4.2  CL 87* 91* 90*  CO2 19* 25 23  GLUCOSE 266* 333* 174*  BUN 88* 37* 61*  CREATININE 15.17* 9.04* 11.85*  CALCIUM 8.5* 8.2*  8.6*  PHOS 9.1* 5.9* 8.5*   Liver Function Tests: Recent Labs  Lab 10/28/17 1529 10/29/17 1900 10/30/17 0412 10/31/17 0614  AST 18  --   --   --   ALT 19  --   --   --   ALKPHOS 98  --   --   --   BILITOT 0.7  --   --   --   PROT 7.1  --   --   --   ALBUMIN 3.5 3.3* 3.6 3.4*   CBC: Recent Labs  Lab 10/28/17 1530 10/29/17 1120 10/29/17 1900 10/30/17 0412 10/31/17 0614  WBC 7.4 8.8 8.1 7.4 7.7  NEUTROABS 4.9  --   --   --   --   HGB 14.0 14.2 13.8 14.9 14.5  HCT 41.8 41.0 41.0 43.4 42.8  MCV 86.9 85.8 85.8 86.1 86.6  PLT 350 361 377 386 403*  Cardiac Enzymes: Recent Labs  Lab 10/28/17 2220 10/29/17 0240 10/29/17 1120  TROPONINI 0.10* 0.10* 0.09*   CBG: Recent Labs  Lab 10/30/17 0738 10/30/17 1139 10/30/17 1635 10/30/17 2015 10/31/17 0745  GLUCAP 205* 157* 135* 254* 170*   Iron Studies: No results for input(s): IRON, TIBC, TRANSFERRIN, FERRITIN in the last  72 hours. Lab Results  Component Value Date   INR 1.00 10/30/2017   INR 1.05 09/13/2014   INR 1.05 09/27/2013   Studies/Results: No results found. Medications: . sodium chloride    . sodium chloride    . sodium chloride    . heparin     . amLODipine  10 mg Oral QHS  . aspirin  324 mg Oral Once  . [START ON 11/01/2017] aspirin  81 mg Oral Daily  . atorvastatin  80 mg Oral q1800  . calcitRIOL  1.25 mcg Oral Q M,W,F-HD  . calcium acetate  1,334 mg Oral TID WC  . carvedilol  3.125 mg Oral BID WC  . docusate sodium  100 mg Oral QHS  . feeding supplement (PRO-STAT SUGAR FREE 64)  30 mL Oral BID  . insulin aspart  0-9 Units Subcutaneous TID WC  . insulin glargine  8 Units Subcutaneous QHS  . lamoTRIgine  150 mg Oral QHS  . lanthanum  1,000 mg Oral TID WC  . lisinopril  2.5 mg Oral Daily  . nicotine  14 mg Transdermal Daily  . nitroGLYCERIN  0.4 mg Transdermal Daily  . sodium chloride flush  3 mL Intravenous Q12H  . sodium chloride flush  3 mL Intravenous Q12H

## 2017-10-31 NOTE — Progress Notes (Signed)
Family Medicine Teaching Service Daily Progress Note Intern Pager: [phone]  Patient name: [name] Mines Sr. Medical record number: [id number] Date of birth: January 31, 1976 Age: 42 y.o. Gender: male  Primary Care Provider: Patient, [name] Per Consultants: Cardiology Code Status: Full   Pt Overview and Major Events to Date:   Assessment and Plan: [name] Tindol Sr. is a 42 y.o. male presenting with chest pain . PMH is significant for T2DM, CAD, HTN, S/P unilateral BKA, ESRD, HrEF, PAD   ACS with Severe 3 vessel dz: EKG changes. Patient was started on heparin drip and Nitro patch. Repeat EKG with continued -ST depressions  Lead 2, 3 and AVF, V5 and V6, slight ST elevation in V. Hx of Left heart Cath in 2016 that showed lesions including 60% LAD and 90% OM2 stenoses. -Cardiology following, appreciate recs; s/p heart cath 3/18 with recommendation for CABG due to severity - CVTS consulted and following, appreciate recommendations - Continue plavix - Continue heparin drip and Nitro patch  - Continue Lipitor 80 mg  T2DM- A1C 12.1. Sliding Scale in Nursing Home -SSI w/ cbgs -Lantus 8u nightly   Chronic HFrEF, grade 2DD.  ECHO  50-55% with G1DD.  Patient does not appear to be taking meds.  No current edema/SOB orthopnea. Euvolemic on exam. - Will continue Coreg 3.125 BID  - Will continue lisinopril 2.5 mg   HTN:  -home amlodipine 10 -coreg 3.25 mg BID  -lisinopril 2.5 mg   CAD- s/p left BKA -no current complaints/lesions reported  -home plavix  ESRD: on HD MWF. HD today  - nephro following for HD, appreciate assistance  - home calcitrol, phoslo  Chronic pain: -home robaxin  Tobacco Abuse  - Nictonine patch  Mood disorder -home lamictal 150  Nausea: vomited 3x during the last few days, no nausea on admission exam -zophran prn  FEN/GI:  renal/carb modified diet with NPO at [place], prostat supplement, colace prn, miralax prn Prophylaxis: heparin  drip  Disposition: Pending CVTS recs for possible CABG, plan for return to SNF   Subjective:  Patient sleepy during HD s/p cath. No complaints, no chest pain or SOB.  Objective: Temp:  [98.2 F (36.8 C)-98.7 F (37.1 C)] 98.7 F (37.1 C) (03/18 0438) Pulse Rate:  [73-90] 80 (03/18 0438) Resp:  [15-18] 15 (03/18 0438) BP: (112-134)/(44-98) 134/52 (03/18 0438) SpO2:  [97 %-100 %] 100 % (03/18 0438) Weight:  [174 lb 3.2 oz (79 kg)] 174 lb 3.2 oz (79 kg) (03/18 0438) Physical Exam: General: NAD, sleeping, easily arousable ENTM: Moist mucous membranes Cardiovascular: RRR, no m/r/g, no LE edema Respiratory: CTA BL, normal work of breathing Gastrointestinal: soft, nontender, nondistended, normoactive BS MSK: moves 4 extremities equally, L BKA Derm: no rashes appreciated Neuro: CN II-XII grossly intact Psych: Appropriate affect  Laboratory: Recent Labs  Lab 10/29/17 1900 10/30/17 0412 10/31/17 0614  WBC 8.1 7.4 7.7  HGB 13.8 14.9 14.5  HCT 41.0 43.4 42.8  PLT 377 386 403*   Recent Labs  Lab 10/28/17 1529  10/28/17 1530 10/29/17 0239 10/29/17 1900 10/30/17 0412  NA  --   --  131*  --  130* 133*  K  --   --  4.0  --  4.5 3.9  CL  --   --  90*  --  87* 91*  CO2  --   --  22  --  19* 25  BUN  --   --  68*  --  88* 37*  CREATININE  --    < >  12.66* 13.89* 15.17* 9.04*  CALCIUM  --   --  9.0  --  8.5* 8.2*  PROT 7.1  --   --   --   --   --   BILITOT 0.7  --   --   --   --   --   ALKPHOS 98  --   --   --   --   --   ALT 19  --   --   --   --   --   AST 18  --   --   --   --   --   GLUCOSE  --   --  322*  --  266* 333*   < > = values in this interval not displayed.   ECHO: Study Conclusions - Left ventricle: The cavity size was normal. Wall thickness was increased in a pattern of moderate to severe LVH. Systolic function was normal. The estimated ejection fraction was in the range of 50% to 55%. Wall motion was normal; there were no regional wall motion abnormalities.  Doppler parameters are consistent with abnormal left ventricular relaxation (grade 1 diastolic dysfunction). - Mitral valve: Moderately calcified annulus.  Dylan Monforte, [place], DO 10/31/2017, 7:05 AM PGY-1 [place] Intern pager: [phone], text pages welcome

## 2017-10-31 NOTE — Progress Notes (Signed)
[facility] for Heparin  Indication: chest pain/ACS  Allergies  Allergen Reactions  . Coconut Oil Anaphylaxis    Can use topically, allergic to coconut foods    Patient Measurements: Height: 6\' 2"  (188 cm) Weight: 174 lb 3.2 oz (79 kg) IBW/kg (Calculated) : 82.2  Vital Signs: Temp: 98.7 F (37.1 C) (03/18 0438) Temp Source: Oral (03/18 0438) BP: 136/45 (03/18 1005) Pulse Rate: 58 (03/18 1005)  Labs: Recent Labs    10/28/17 2220  10/29/17 0240 10/29/17 1120 10/29/17 1538 10/29/17 1900 10/30/17 0412 10/30/17 0741 10/30/17 1151 10/31/17 0614  HGB  --   --   --  14.2  --  13.8 14.9  --   --  14.5  HCT  --   --   --  41.0  --  41.0 43.4  --   --  42.8  PLT  --   --   --  361  --  377 386  --   --  403*  LABPROT  --   --   --   --   --   --   --   --  13.1  --   INR  --   --   --   --   --   --   --   --  1.00  --   HEPARINUNFRC  --   --   --   --  0.20*  --   --  0.35  --  0.35  CREATININE  --    < >  --   --   --  15.17* 9.04*  --   --  11.85*  TROPONINI 0.10*  --  0.10* 0.09*  --   --   --   --   --   --    < > = values in this interval not displayed.   Estimated Creatinine Clearance: 9.3 mL/min (A) (by C-G formula based on SCr of 11.85 mg/dL (H)).  Assessment: 89 yoM presents with CP, mildly elevated troponin. Pharmacy consulted to dose IV heparin for r/o ACS. Cardiology planning for cath on 3/18. Heparin level remained therapeutic at 0.35 this morning on IV Heparin drip rate 1200 units/hr.  CBC stable WNL. No bleeding noted.   Now s/p cath today - severe multivessel CAD.  Heparin drip to restart 8 hours post sheath removal, removed at 09:35.  No bleeding or hematoma noted.  Goal of Therapy:  Heparin level 0.3-0.7 units/ml Monitor platelets by anticoagulation protocol: Yes   Plan:  Restart heparin drip 8 hours post sheath removal,  Heparin restart at 17:45 today at rate of 1200 units/hr F/u 6 hour heparin level Daily CBC and  heparin level Monitor for s/sx of bleeding  Thank you for allowing pharmacy to be part of this patients care team.  Nicole Cella, [place] Clinical Pharmacist Pager: [phone] or [phone] (330p-1030p) Main Rx [phone] 10/31/2017,10:12 AM

## 2017-10-31 NOTE — Plan of Care (Signed)
  Education: Knowledge of General Education information will improve 10/31/2017 2332 - Completed/Met by Theora Gianotti, RN  Pt oriented to unit, plan of care, and method of reporting concerns. Verbalizes understanding of need to call for assistance when necessary. Call light and bedside table within reach.

## 2017-10-31 NOTE — Progress Notes (Signed)
Initial Nutrition Assessment  DOCUMENTATION CODES:   Not applicable  INTERVENTION:   D/C Prostat.  Nepro BID; each supplement provides 425kcal, 19.1g protein.  NUTRITION DIAGNOSIS:   Increased nutrient needs related to chronic illness(ESRD on HD) as evidenced by estimated needs.  GOAL:   Patient will meet greater than or equal to 90% of their needs  MONITOR:   PO intake, Supplement acceptance, Labs, I & O's  REASON FOR ASSESSMENT:   Malnutrition Screening Tool    ASSESSMENT:   42 y.o. male presenting with chest pain . PMH is significant for T2DM, CAD, HTN, S/P unilateral BKA, ESRD, HrEF, PAD. Pt presented from SNF with NSTEMI and left heart cath and coronary angiography.  Pt reports having a "so-so" appetite that has been going on for 4-32months. He reports he has been at [facility] for 19months and feels he has lost 40lb during his stay due to the poor quality of food/decreased intake. Pt reports he wants to gain 15-20lb. Wt hx outlined below. Pt reports vomiting 2-3x/week at SNF along with variable food intake due to extreme dislike of the food. Pt also states food has been given to him that exceeds his renal diet parameters. Pt sees RD biweekly at facility and has voiced concerns of the food. Pt is well aware of dietary restrictions and seems to have a supportive family.   Pt reports not having a bowel movement since 3/14. Being addressed via medications. Pt also feels this is due to decreased oral intake at SNF.  At time of visit pt was awaiting the first meal of the day due to being NPO in the morning for cath lab procedure.  Pt dislikes prostat and would like to receive Nepro instead. Pt receives Nepro during HD treatment at SNF.   Per chart review and pt report, pt is eating on average 75% of meals. Renal/cho mod diet provides: 1775kcal and 83g protein. Meets 74-87% of needs.  Actual wt on 3/18 174lb 3.2oz (79.017kg) Post dialysis wt 169lb 12.1oz (77kg) on 3/16 Per  chart review pt has lost 8% since August 2018 which is not significant for timeframe but is concerning if poor intake continues.  Net negative 2.5L since admit Positive 79mL x24 hours.  Medications: Calcitriol, phoslo, colace, fosrenol, miralax.  Labs: Na (L; 132), K (WNL), BUN (H;61), Cr (H;11.85), corrected Ca (WNL), Albumin (L;3.4).  CBG (last 3)  Recent Labs    10/30/17 2015 10/31/17 0745 10/31/17 1121  GLUCAP 254* 170* 108*     NUTRITION - FOCUSED PHYSICAL EXAM:  No signs of muscle/fat depletions or micronutrient deficiencies noted at this time.   Pt feels wound is the same as 2-3 months ago. At that time he reports his toe amputation wound, re-opened.   Diet Order:  Diet renal/carb modified with fluid restriction Diet-HS Snack? Nothing; Fluid restriction: 1200 mL Fluid; Room service appropriate? 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  Estimated Nutritional Needs:   Kcal:  2400-2600  Protein:  95-105g  Fluid:  per MD   Minela [place], MS, Dietetic Intern Pager # [phone]

## 2017-10-31 NOTE — Progress Notes (Signed)
CSW continuing to follow. CSW spoke to admissions at [facility] and confirmed patient's PASRR number and confirmed patient can return there when medically stable. CSW will support with discharge when patient ready.  [name], [place]

## 2017-11-01 ENCOUNTER — Other Ambulatory Visit: Payer: Self-pay | Admitting: *Deleted

## 2017-11-01 ENCOUNTER — Inpatient Hospital Stay (HOSPITAL_COMMUNITY): Payer: Medicaid Other

## 2017-11-01 DIAGNOSIS — I2510 Atherosclerotic heart disease of native coronary artery without angina pectoris: Secondary | ICD-10-CM

## 2017-11-01 DIAGNOSIS — Z01810 Encounter for preprocedural cardiovascular examination: Secondary | ICD-10-CM

## 2017-11-01 LAB — RENAL FUNCTION PANEL
Albumin: 3.7 g/dL (ref 3.5–5.0)
Anion gap: 17 — ABNORMAL HIGH (ref 5–15)
BUN: 37 mg/dL — ABNORMAL HIGH (ref 6–20)
CHLORIDE: 93 mmol/L — AB (ref 101–111)
CO2: 23 mmol/L (ref 22–32)
CREATININE: 9.03 mg/dL — AB (ref 0.61–1.24)
Calcium: 9.4 mg/dL (ref 8.9–10.3)
GFR calc non Af Amer: 6 mL/min — ABNORMAL LOW (ref >60)
GFR, EST AFRICAN AMERICAN: 7 mL/min — AB (ref >60)
Glucose, Bld: 132 mg/dL — ABNORMAL HIGH (ref 65–99)
Phosphorus: 6 mg/dL — ABNORMAL HIGH (ref 2.5–4.6)
Potassium: 4.3 mmol/L (ref 3.5–5.1)
Sodium: 133 mmol/L — ABNORMAL LOW (ref 135–145)

## 2017-11-01 LAB — CBC
HCT: 47.6 % (ref 39.0–52.0)
Hemoglobin: 16 g/dL (ref 13.0–17.0)
MCH: 29.7 pg (ref 26.0–34.0)
MCHC: 33.6 g/dL (ref 30.0–36.0)
MCV: 88.3 fL (ref 78.0–100.0)
PLATELETS: 346 10*3/uL (ref 150–400)
RBC: 5.39 MIL/uL (ref 4.22–5.81)
RDW: 16.1 % — AB (ref 11.5–15.5)
WBC: 7.4 10*3/uL (ref 4.0–10.5)

## 2017-11-01 LAB — GLUCOSE, CAPILLARY
GLUCOSE-CAPILLARY: 201 mg/dL — AB (ref 65–99)
GLUCOSE-CAPILLARY: 127 mg/dL — AB (ref 65–99)
Glucose-Capillary: 142 mg/dL — ABNORMAL HIGH (ref 65–99)
Glucose-Capillary: 207 mg/dL — ABNORMAL HIGH (ref 65–99)

## 2017-11-01 LAB — HEPARIN LEVEL (UNFRACTIONATED)
Heparin Unfractionated: 0.28 IU/mL — ABNORMAL LOW (ref 0.30–0.70)
Heparin Unfractionated: 0.34 IU/mL (ref 0.30–0.70)

## 2017-11-01 MED FILL — Heparin Sodium (Porcine) 2 Unit/ML in Sodium Chloride 0.9%: INTRAMUSCULAR | Qty: 1000 | Status: AC

## 2017-11-01 NOTE — Progress Notes (Addendum)
[facility] Progress Note   Dialysis Orders: [facility] MWF left AVF 4 hrs 200 NRe 500/800 73 kg 2.0K/2.0 Ca UFP 2 -Heparin 2400 unit IV TIW -Calcitriol 1.25 mcg PO TIW PTH 275-Venofer 50 mg IV weekly (last dose 10/26/17)  Assessment/Plan: 1. Chest pain: ACSvs demand ischemia.Troponin peak .10. ST seg depression noted on EKG inferior laterally. Plan for CABG Friday- severe multivessel CAD 2. ESRD - MWF.HD again Thursday due to planned surgery Friday then again Saturday then back on schedule next week 3. Hypertension/volume -Has not been getting to EDW as outpt .net UF 2.9 3/18. with post wt 76.6 -nonstanding. On 10 amlodipine , lisinopril 2.5 and coreg 3.125 bid 4. Anemia - HGB 16No ESA. 5. Metabolic bone disease -Ca 9 P 6 Contiue VDRA/change to non Ca based binders only - prev on fosrenol/ca acetate - if P remains un - will increase Fosrenol to 2 ac. 6. Nutrition -Albumin 3.7Renal/Carb Mod diet, renal vit, nepro 7. DM per primary 8.  Disp - patient living at [facility] since last August 9.  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   Myriam Jacobson, PA-C [facility] Beeper [phone] 11/01/2017,9:28 AM  LOS: 4 days   Pt seen, examined and agree w A/P as above.  Kelly Splinter MD [facility] pager [phone]   11/01/2017, 2:03 PM    Subjective:   Ready to get the surgery overwith.  Had chest pain the last half hr of HD - no cramping.  Eating better than at [facility]  Objective Vitals:   10/31/17 1811 10/31/17 1950 11/01/17 0345 11/01/17 0349  BP: (!) 141/62 (!) 128/41 (!) 159/89 133/72  Pulse: 85 81    Resp: 18 18    Temp: 98.3 F (36.8 C) 98.5 F (36.9 C) 98.3 F (36.8 C)   TempSrc: Oral Oral Oral   SpO2: 100% 100% 95%   Weight:      Height:       Physical Exam General: thin supine in bed, NAD Heart: RRR Lungs:no rales Abdomen:soft NT + BS Extremities: left BKA and right LE no edema Dialysis Access: left AVF +  bruit  Additional Objective Labs: Basic Metabolic Panel: Recent Labs  Lab 10/31/17 0614 10/31/17 1329 11/01/17 0720  NA 132* 131* 133*  K 4.2 4.8 4.3  CL 90* 91* 93*  CO2 23 21* 23  GLUCOSE 174* 175* 132*  BUN 61* 67* 37*  CREATININE 11.85* 12.87* 9.03*  CALCIUM 8.6* 8.4* 9.4  PHOS 8.5* 8.9* 6.0*   Liver Function Tests: Recent Labs  Lab 10/28/17 1529  10/31/17 0614 10/31/17 1329 11/01/17 0720  AST 18  --   --   --   --   ALT 19  --   --   --   --   ALKPHOS 98  --   --   --   --   BILITOT 0.7  --   --   --   --   PROT 7.1  --   --   --   --   ALBUMIN 3.5   < > 3.4* 3.2* 3.7   < > = values in this interval not displayed.   No results for input(s): LIPASE, AMYLASE in the last 168 hours. CBC: Recent Labs  Lab 10/28/17 1530  10/29/17 1900 10/30/17 0412 10/31/17 0614 10/31/17 1329 11/01/17 0720  WBC 7.4   < > 8.1 7.4 7.7 5.6 7.4  NEUTROABS 4.9  --   --   --   --   --   --  HGB 14.0   < > 13.8 14.9 14.5 13.5 16.0  HCT 41.8   < > 41.0 43.4 42.8 39.9 47.6  MCV 86.9   < > 85.8 86.1 86.6 86.0 88.3  PLT 350   < > 377 386 403* 346 346   < > = values in this interval not displayed.   Blood Culture    Component Value Date/Time   SDES BLOOD RIGHT HAND 03/07/2017 2304   SPECREQUEST  03/07/2017 2304    BOTTLES DRAWN AEROBIC AND ANAEROBIC Blood Culture adequate volume   CULT NO GROWTH 5 DAYS 03/07/2017 2304   REPTSTATUS 03/13/2017 FINAL 03/07/2017 2304    Cardiac Enzymes: Recent Labs  Lab 10/28/17 2220 10/29/17 0240 10/29/17 1120  TROPONINI 0.10* 0.10* 0.09*   CBG: Recent Labs  Lab 10/31/17 0745 10/31/17 1121 10/31/17 1808 10/31/17 2109 11/01/17 0802  GLUCAP 170* 108* 132* 316* 142*   Iron Studies: No results for input(s): IRON, TIBC, TRANSFERRIN, FERRITIN in the last 72 hours. Lab Results  Component Value Date   INR 1.00 10/30/2017   INR 1.05 09/13/2014   INR 1.05 09/27/2013   Studies/Results: No results found. Medications: . sodium chloride     . heparin 1,300 Units/hr (11/01/17 0641)   . amLODipine  10 mg Oral QHS  . aspirin  324 mg Oral Once  . aspirin  81 mg Oral Daily  . atorvastatin  80 mg Oral q1800  . calcitRIOL  1.25 mcg Oral Q M,W,F-HD  . calcium acetate  1,334 mg Oral TID WC  . carvedilol  3.125 mg Oral BID WC  . Chlorhexidine Gluconate Cloth  6 each Topical Q0600  . docusate sodium  100 mg Oral QHS  . feeding supplement (NEPRO CARB STEADY)  237 mL Oral BID BM  . insulin aspart  0-9 Units Subcutaneous TID WC  . insulin glargine  8 Units Subcutaneous QHS  . lamoTRIgine  150 mg Oral QHS  . lanthanum  1,000 mg Oral TID WC  . lisinopril  2.5 mg Oral Daily  . mupirocin ointment  1 application Nasal BID  . nicotine  14 mg Transdermal Daily  . nitroGLYCERIN  0.4 mg Transdermal Daily  . sodium chloride flush  3 mL Intravenous Q12H  . sodium chloride flush  3 mL Intravenous Q12H    [facility] Progress Note   Dialysis Orders:  Assessment/Plan: 1.  2. ESRD - 3. Anemia -  4. Secondary hyperparathyroidism -  5. HTN/volume -  6. Nutrition -   Myriam Jacobson, PA-C [facility] Beeper [phone] 11/01/2017,9:29 AM  LOS: 4 days   Subjective:     Objective Vitals:   10/31/17 1811 10/31/17 1950 11/01/17 0345 11/01/17 0349  BP: (!) 141/62 (!) 128/41 (!) 159/89 133/72  Pulse: 85 81    Resp: 18 18    Temp: 98.3 F (36.8 C) 98.5 F (36.9 C) 98.3 F (36.8 C)   TempSrc: Oral Oral Oral   SpO2: 100% 100% 95%   Weight:      Height:       Physical Exam General: Heart: Lungs: Abdomen: Extremities: Dialysis Access:    Additional Objective Labs: Basic Metabolic Panel: Recent Labs  Lab 10/31/17 0614 10/31/17 1329 11/01/17 0720  NA 132* 131* 133*  K 4.2 4.8 4.3  CL 90* 91* 93*  CO2 23 21* 23  GLUCOSE 174* 175* 132*  BUN 61* 67* 37*  CREATININE 11.85* 12.87* 9.03*  CALCIUM 8.6* 8.4* 9.4  PHOS 8.5* 8.9* 6.0*  Liver Function Tests: Recent Labs  Lab 10/28/17 1529   10/31/17 0614 10/31/17 1329 11/01/17 0720  AST 18  --   --   --   --   ALT 19  --   --   --   --   ALKPHOS 98  --   --   --   --   BILITOT 0.7  --   --   --   --   PROT 7.1  --   --   --   --   ALBUMIN 3.5   < > 3.4* 3.2* 3.7   < > = values in this interval not displayed.   No results for input(s): LIPASE, AMYLASE in the last 168 hours. CBC: Recent Labs  Lab 10/28/17 1530  10/29/17 1900 10/30/17 0412 10/31/17 0614 10/31/17 1329 11/01/17 0720  WBC 7.4   < > 8.1 7.4 7.7 5.6 7.4  NEUTROABS 4.9  --   --   --   --   --   --   HGB 14.0   < > 13.8 14.9 14.5 13.5 16.0  HCT 41.8   < > 41.0 43.4 42.8 39.9 47.6  MCV 86.9   < > 85.8 86.1 86.6 86.0 88.3  PLT 350   < > 377 386 403* 346 346   < > = values in this interval not displayed.   Blood Culture    Component Value Date/Time   SDES BLOOD RIGHT HAND 03/07/2017 2304   SPECREQUEST  03/07/2017 2304    BOTTLES DRAWN AEROBIC AND ANAEROBIC Blood Culture adequate volume   CULT NO GROWTH 5 DAYS 03/07/2017 2304   REPTSTATUS 03/13/2017 FINAL 03/07/2017 2304    Cardiac Enzymes: Recent Labs  Lab 10/28/17 2220 10/29/17 0240 10/29/17 1120  TROPONINI 0.10* 0.10* 0.09*   CBG: Recent Labs  Lab 10/31/17 0745 10/31/17 1121 10/31/17 1808 10/31/17 2109 11/01/17 0802  GLUCAP 170* 108* 132* 316* 142*   Iron Studies: No results for input(s): IRON, TIBC, TRANSFERRIN, FERRITIN in the last 72 hours. Lab Results  Component Value Date   INR 1.00 10/30/2017   INR 1.05 09/13/2014   INR 1.05 09/27/2013   Studies/Results: No results found. Medications: . sodium chloride    . heparin 1,300 Units/hr (11/01/17 0641)   . amLODipine  10 mg Oral QHS  . aspirin  324 mg Oral Once  . aspirin  81 mg Oral Daily  . atorvastatin  80 mg Oral q1800  . calcitRIOL  1.25 mcg Oral Q M,W,F-HD  . calcium acetate  1,334 mg Oral TID WC  . carvedilol  3.125 mg Oral BID WC  . Chlorhexidine Gluconate Cloth  6 each Topical Q0600  . docusate sodium  100 mg  Oral QHS  . feeding supplement (NEPRO CARB STEADY)  237 mL Oral BID BM  . insulin aspart  0-9 Units Subcutaneous TID WC  . insulin glargine  8 Units Subcutaneous QHS  . lamoTRIgine  150 mg Oral QHS  . lanthanum  1,000 mg Oral TID WC  . lisinopril  2.5 mg Oral Daily  . mupirocin ointment  1 application Nasal BID  . nicotine  14 mg Transdermal Daily  . nitroGLYCERIN  0.4 mg Transdermal Daily  . sodium chloride flush  3 mL Intravenous Q12H  . sodium chloride flush  3 mL Intravenous Q12H

## 2017-11-01 NOTE — Progress Notes (Signed)
Pt experienced episode of 10/10 chest tightness. SL nitro administered every 5 minutes X3. Pain resolved to 0/10. Supplemental oxygen applied 2L [place] for comfort. Pt resting comfortably. Will continue to monitor closely.

## 2017-11-01 NOTE — Progress Notes (Signed)
CSW informed by RN that patient had concerns about holding his bed at [facility]. CSW spoke to admissions there and they indicated patient's bed will be held and patient will not be charged for bed hold at this time, as the facility is not at capacity. CSW met with patient at bedside and informed him of this.  CSW noted plan for CABG. CSW to support with discharge back to facility when medically ready.   Estanislado Emms, [place]

## 2017-11-01 NOTE — Progress Notes (Signed)
[facility] [facility] for Heparin  Indication: chest pain/ACS  Allergies  Allergen Reactions  . Coconut Oil Anaphylaxis    Can use topically, allergic to coconut foods    Patient Measurements: Height: 6\' 2"  (188 cm) Weight: 168 lb 14 oz (76.6 kg) IBW/kg (Calculated) : 82.2  Vital Signs: Temp: 98.5 F (36.9 C) (03/18 1950) Temp Source: Oral (03/18 1950) BP: 128/41 (03/18 1950) Pulse Rate: 81 (03/18 1950)  Labs: Recent Labs    10/29/17 0240 10/29/17 1120  10/30/17 0412 10/30/17 0741 10/30/17 1151 10/31/17 0614 10/31/17 1329 11/01/17 0000  HGB  --  14.2   < > 14.9  --   --  14.5 13.5  --   HCT  --  41.0   < > 43.4  --   --  42.8 39.9  --   PLT  --  361   < > 386  --   --  403* 346  --   LABPROT  --   --   --   --   --  13.1  --   --   --   INR  --   --   --   --   --  1.00  --   --   --   HEPARINUNFRC  --   --    < >  --  0.35  --  0.35  --  0.28*  CREATININE  --   --    < > 9.04*  --   --  11.85* 12.87*  --   TROPONINI 0.10* 0.09*  --   --   --   --   --   --   --    < > = values in this interval not displayed.   Estimated Creatinine Clearance: 8.3 mL/min (A) (by C-G formula based on SCr of 12.87 mg/dL (H)).  Assessment: 42 y.o. male with CAD s/p cath, awaiting poss CABG  for heparin  Goal of Therapy:  Heparin level 0.3-0.7 units/ml Monitor platelets by anticoagulation protocol: Yes   Plan:  Increase Heparin 1300 units/hr Follow-up am labs.   Phillis Knack, PharmD, BCPS  11/01/2017,12:49 AM

## 2017-11-01 NOTE — Progress Notes (Signed)
Pre-CABG testing has been completed. 1-39% ICA stenosis bilaterally.  ABI's Right Non-compressible Left Unable to obtain due to foot amputation.  11/01/17 3:17 PM [name] RVT

## 2017-11-01 NOTE — Progress Notes (Signed)
[place] for Heparin  Indication: chest pain/ACS  Allergies  Allergen Reactions  . Coconut Oil Anaphylaxis    Can use topically, allergic to coconut foods    Patient Measurements: Height: 6\' 2"  (188 cm) Weight: 168 lb 14 oz (76.6 kg) IBW/kg (Calculated) : 82.2  Vital Signs: Temp: 98.3 F (36.8 C) (03/19 0345) Temp Source: Oral (03/19 0345) BP: 133/72 (03/19 0349)  Labs: Recent Labs    10/30/17 1151 10/31/17 0614 10/31/17 1329 11/01/17 0000 11/01/17 0720 11/01/17 1117  HGB  --  14.5 13.5  --  16.0  --   HCT  --  42.8 39.9  --  47.6  --   PLT  --  403* 346  --  346  --   LABPROT 13.1  --   --   --   --   --   INR 1.00  --   --   --   --   --   HEPARINUNFRC  --  0.35  --  0.28*  --  0.34  CREATININE  --  11.85* 12.87*  --  9.03*  --    Estimated Creatinine Clearance: 11.8 mL/min (A) (by C-G formula based on SCr of 9.03 mg/dL (H)).  Assessment: 42 y.o. male with CAD s/p cath, awaiting poss CABG  for heparin Heparin level = 0.34, therapeutic on heparin drip 1300 units/hr. No bleeding noted. Plan for CABG on Friday 11/04/17 per Dr. Roxan Hockey after plavix washout.  Goal of Therapy:  Heparin level 0.3-0.7 units/ml Monitor platelets by anticoagulation protocol: Yes   Plan:  Continue IV Heparin 1300 units/hr Daily heparin level and CBC  Nicole Cella, [place] Clinical Pharmacist Pager: [phone] or [phone] (330p-1030p) Main Rx [phone] 11/01/2017,12:48 PM

## 2017-11-01 NOTE — Progress Notes (Addendum)
Family Medicine Teaching Service Daily Progress Note Intern Pager: [phone]  Patient name: [name] Nee Sr. Medical record number: [id number] Date of birth: 05-25-1976 Age: 42 y.o. Gender: male  Primary Care Provider: Patient, [name] Per Consultants: Cardiology Code Status: Full   Pt Overview and Major Events to Date:   Assessment and Plan: [name] Pound Sr. is a 42 y.o. male presenting with chest pain . PMH is significant for T2DM, CAD, HTN, S/P unilateral BKA, ESRD, HrEF, PAD   CAD with severe 3 vessel dz s/p CATH: Patient was started on heparin drip and Nitro patch. Patient with chest tightness overnight requiring SL nitro x3, and pain resolved.  -Cardiology following, appreciate recs; s/p heart cath 3/18 with recommendation for CABG due to severity - CVTS consulted and following, appreciate recommendations- surgery on Friday - Plavix d/c for CABG, continue heparin - Continue Lipitor 80 mg  T2DM- A1C 12.1. Sliding Scale in Nursing Home -SSI w/ cbgs -Lantus 8u nightly   Chronic HFrEF, grade 2DD.  ECHO  50-55% with G1DD.  Patient does not appear to be taking meds.  No current edema/SOB orthopnea. Euvolemic on exam. - Will continue Coreg 3.125 BID  - Will continue lisinopril 2.5 mg   HTN:  -home amlodipine 10 -coreg 3.25 mg BID  -lisinopril 2.5 mg   PVD- s/p left BKA -no current complaints/lesions reported  -home plavix  ESRD: on HD MWF. HD 3/20 - nephro following for HD, appreciate assistance  - home calcitrol, phoslo  Chronic pain: -home robaxin  Tobacco Abuse  - Nictonine patch  Mood disorder -home lamictal 150  Nausea: vomited 3x during the last few days, no nausea on admission exam -zophran prn  FEN/GI:  renal/carb modified diet with NPO at [place], prostat supplement, colace prn, miralax prn Prophylaxis: heparin drip  Disposition: Pending CVTS recs for possible CABG, plan for return to SNF   Subjective:  Patient worried about  paying to keep bed at current SNF if surgery isnt until Friday. Also worried because he lost his apartment while in SNF. Will let SW now.   Objective: Temp:  [98 F (36.7 C)-98.5 F (36.9 C)] 98.3 F (36.8 C) (03/19 0345) Pulse Rate:  [57-85] 81 (03/18 1950) Resp:  [9-18] 18 (03/18 1950) BP: (128-162)/(35-89) 133/72 (03/19 0349) SpO2:  [0 %-100 %] 95 % (03/19 0345) Weight:  [168 lb 14 oz (76.6 kg)-175 lb 14.8 oz (79.8 kg)] 168 lb 14 oz (76.6 kg) (03/18 1800) Physical Exam: General: NAD, pleasant Eyes: PERRL, EOMI, muddy conjunctival pallor, no injection ENTM: Moist mucous membranes Cardiovascular: RRR, no m/r/g, no LE edema Respiratory: CTA BL, normal work of breathing Gastrointestinal: soft, nontender, nondistended, normoactive BS MSK: L BKA with wound on R foot s/p amputation of great and 2nd toe on R Derm: no rashes appreciated Neuro: CN II-XII grossly intact Psych: AOx3, appropriate affect  Laboratory: Recent Labs  Lab 10/31/17 0614 10/31/17 1329 11/01/17 0720  WBC 7.7 5.6 7.4  HGB 14.5 13.5 16.0  HCT 42.8 39.9 47.6  PLT 403* 346 346   Recent Labs  Lab 10/28/17 1529  10/31/17 0614 10/31/17 1329 11/01/17 0720  NA  --    < > 132* 131* 133*  K  --    < > 4.2 4.8 4.3  CL  --    < > 90* 91* 93*  CO2  --    < > 23 21* 23  BUN  --    < > 61* 67* 37*  CREATININE  --    < >  11.85* 12.87* 9.03*  CALCIUM  --    < > 8.6* 8.4* 9.4  PROT 7.1  --   --   --   --   BILITOT 0.7  --   --   --   --   ALKPHOS 98  --   --   --   --   ALT 19  --   --   --   --   AST 18  --   --   --   --   GLUCOSE  --    < > 174* 175* 132*   < > = values in this interval not displayed.   ECHO: Study Conclusions - Left ventricle: The cavity size was normal. Wall thickness was increased in a pattern of moderate to severe LVH. Systolic function was normal. The estimated ejection fraction was in the range of 50% to 55%. Wall motion was normal; there were no regional wall motion abnormalities.  Doppler parameters are consistent with abnormal left ventricular relaxation (grade 1 diastolic dysfunction). - Mitral valve: Moderately calcified annulus.  Andelyn Spade, [place], DO 11/01/2017, 9:37 AM PGY-1 [place] Intern pager: [phone], text pages welcome

## 2017-11-01 NOTE — Plan of Care (Signed)
  Nutrition: Adequate nutrition will be maintained 11/01/2017 2256 - Completed/Met by Theora Gianotti, RN  Pt endorses a good appetite. Consumes 75-100% of meals following prescribed diet.

## 2017-11-01 NOTE — Progress Notes (Signed)
Progress Note  Patient Name: [name] Goodie Sr. Date of Encounter: 11/01/2017  Primary Cardiologist: Jenkins Rouge, MD   Subjective   Had chest pain early this morning. Resolved with SL nitro x 3. Now on patch. No recurrent pain.   Inpatient Medications    Scheduled Meds: . amLODipine  10 mg Oral QHS  . aspirin  324 mg Oral Once  . aspirin  81 mg Oral Daily  . atorvastatin  80 mg Oral q1800  . calcitRIOL  1.25 mcg Oral Q M,W,F-HD  . carvedilol  3.125 mg Oral BID WC  . Chlorhexidine Gluconate Cloth  6 each Topical Q0600  . docusate sodium  100 mg Oral QHS  . feeding supplement (NEPRO CARB STEADY)  237 mL Oral BID BM  . insulin aspart  0-9 Units Subcutaneous TID WC  . insulin glargine  8 Units Subcutaneous QHS  . lamoTRIgine  150 mg Oral QHS  . lanthanum  1,000 mg Oral TID WC  . lisinopril  2.5 mg Oral Daily  . mupirocin ointment  1 application Nasal BID  . nicotine  14 mg Transdermal Daily  . nitroGLYCERIN  0.4 mg Transdermal Daily  . sodium chloride flush  3 mL Intravenous Q12H  . sodium chloride flush  3 mL Intravenous Q12H   Continuous Infusions: . sodium chloride    . heparin 1,300 Units/hr (11/01/17 0641)   PRN Meds: sodium chloride, acetaminophen, diazepam, ipratropium-albuterol, methocarbamol, nitroGLYCERIN, ondansetron (ZOFRAN) IV, ondansetron, polyethylene glycol, sodium chloride flush   Vital Signs    Vitals:   10/31/17 1811 10/31/17 1950 11/01/17 0345 11/01/17 0349  BP: (!) 141/62 (!) 128/41 (!) 159/89 133/72  Pulse: 85 81    Resp: 18 18    Temp: 98.3 F (36.8 C) 98.5 F (36.9 C) 98.3 F (36.8 C)   TempSrc: Oral Oral Oral   SpO2: 100% 100% 95%   Weight:      Height:        Intake/Output Summary (Last 24 hours) at 11/01/2017 1004 Last data filed at 11/01/2017 0641 Gross per 24 hour  Intake 631.37 ml  Output 2895 ml  Net -2263.63 ml   Filed Weights   10/31/17 0438 10/31/17 1315 10/31/17 1800  Weight: 174 lb 3.2 oz (79 kg) 175 lb 14.8  oz (79.8 kg) 168 lb 14 oz (76.6 kg)    Telemetry    SR  - Personally Reviewed  ECG    SR with inferior lateral TWI - Personally Reviewed  Physical Exam   GEN: No acute distress.   Neck: No JVD Cardiac: RRR, no murmurs, rubs, or gallops. R groin cath site stable Respiratory: Clear to auscultation bilaterally. GI: Soft, nontender, non-distended  MS: No edema; S/p L BKA Neuro:  Nonfocal  Psych: Normal affect   Labs    Chemistry Recent Labs  Lab 10/28/17 1529  10/31/17 0614 10/31/17 1329 11/01/17 0720  NA  --    < > 132* 131* 133*  K  --    < > 4.2 4.8 4.3  CL  --    < > 90* 91* 93*  CO2  --    < > 23 21* 23  GLUCOSE  --    < > 174* 175* 132*  BUN  --    < > 61* 67* 37*  CREATININE  --    < > 11.85* 12.87* 9.03*  CALCIUM  --    < > 8.6* 8.4* 9.4  PROT 7.1  --   --   --   --  ALBUMIN 3.5   < > 3.4* 3.2* 3.7  AST 18  --   --   --   --   ALT 19  --   --   --   --   ALKPHOS 98  --   --   --   --   BILITOT 0.7  --   --   --   --   GFRNONAA  --    < > 5* 4* 6*  GFRAA  --    < > 5* 5* 7*  ANIONGAP  --    < > 19* 19* 17*   < > = values in this interval not displayed.     Hematology Recent Labs  Lab 10/31/17 0614 10/31/17 1329 11/01/17 0720  WBC 7.7 5.6 7.4  RBC 4.94 4.64 5.39  HGB 14.5 13.5 16.0  HCT 42.8 39.9 47.6  MCV 86.6 86.0 88.3  MCH 29.4 29.1 29.7  MCHC 33.9 33.8 33.6  RDW 15.9* 15.5 16.1*  PLT 403* 346 346    Cardiac Enzymes Recent Labs  Lab 10/28/17 2220 10/29/17 0240 10/29/17 1120  TROPONINI 0.10* 0.10* 0.09*    Recent Labs  Lab 10/28/17 1608 10/28/17 1843  TROPIPOC 0.03 0.07     BNPNo results for input(s): BNP, PROBNP in the last 168 hours.   DDimer  Recent Labs  Lab 10/28/17 1529  DDIMER <0.27     Radiology    No results found.  Cardiac Studies   LEFT HEART CATH AND CORONARY ANGIOGRAPHY  Conclusion     Ost 2nd Diag to 2nd Diag lesion is 99% stenosed.  Prox LAD to Mid LAD lesion is 99% stenosed.  Ost 2nd Mrg  to 2nd Mrg lesion is 80% stenosed.  Mid Cx to Dist Cx lesion is 100% stenosed.  Ost RCA to Prox RCA lesion is 20% stenosed.  Mid RCA lesion is 40% stenosed.  Dist RCA-1 lesion is 90% stenosed.  Dist RCA-2 lesion is 80% stenosed.  The left ventricular systolic function is normal.  LV end diastolic pressure is normal.   Severe multivessel CAD with coronary calcification involving all major coronary arteries.   There is 99% subtotal occlusion of a small second diagonal branch of the LAD.  The mid LAD is very tortuous and has diffuse 95% nodular very calcified stenoses in the mid segment; the circumflex vessel has 80% marginal stenosis and is occluded after this marginal takeoff with collateralization retrograde distally; and a large dominant calcified RCA with 20% proximal 40% mid, and focal 90 and 80% stenoses after the acute margin and before the PDA takeoff.  Mild LV dysfunction with an ejection fraction of 40 to less than 45% with distal anterolateral apical hypocontractility.  LVEDP 5-7 mm.  RECOMMENDATION: With the extensive coronary calcification and multivessel CAD recommend surgical consultation for CABG revascularization   Diagnostic Diagram       Echo 10/30/17 Study Conclusions  - Left ventricle: The cavity size was normal. Wall thickness was   increased in a pattern of moderate to severe LVH. Systolic   function was normal. The estimated ejection fraction was in the   range of 50% to 55%. Wall motion was normal; there were no   regional wall motion abnormalities. Doppler parameters are   consistent with abnormal left ventricular relaxation (grade 1   diastolic dysfunction). - Mitral valve: Moderately calcified annulus.  Patient Profile     42 y.o. male with a hx of DM, ESRD on HD, CAD, HFrEF with  improved LVEF, HTN admitted with [place].   Assessment & Plan    1. Unstable angina - Troponin flat trend of 0.1 >> 0.1>>0.09. Treated with IV heparin. Cath showed   multivessel CAD as noted above. Plan for CABG tentatively on Friday after plavix washout. Had recurrent chest pain this morning. No reoccurrence after SL nitro x 3.  - Continue ASA, IV heparin, coreg, Lipitor and nitro patch. If again recurrent pain, consider changing nitro patch to IV nitro.   2. HLD - 10/29/2017: Cholesterol 187; HDL 35; LDL Cholesterol 99; Triglycerides 266; VLDL 53  - Continue high dose statin   For questions or updates, please contact [place] Please consult [email or web address] for contact info under Cardiology/STEMI.      Jarrett Soho, PA  11/01/2017, 10:04 AM

## 2017-11-02 LAB — CBC
HCT: 42.8 % (ref 39.0–52.0)
Hemoglobin: 14.3 g/dL (ref 13.0–17.0)
MCH: 29 pg (ref 26.0–34.0)
MCHC: 33.4 g/dL (ref 30.0–36.0)
MCV: 86.8 fL (ref 78.0–100.0)
PLATELETS: 402 10*3/uL — AB (ref 150–400)
RBC: 4.93 MIL/uL (ref 4.22–5.81)
RDW: 15.6 % — ABNORMAL HIGH (ref 11.5–15.5)
WBC: 9.9 10*3/uL (ref 4.0–10.5)

## 2017-11-02 LAB — RENAL FUNCTION PANEL
Albumin: 3.7 g/dL (ref 3.5–5.0)
Anion gap: 17 — ABNORMAL HIGH (ref 5–15)
BUN: 63 mg/dL — ABNORMAL HIGH (ref 6–20)
CALCIUM: 9.4 mg/dL (ref 8.9–10.3)
CHLORIDE: 91 mmol/L — AB (ref 101–111)
CO2: 23 mmol/L (ref 22–32)
Creatinine, Ser: 10.88 mg/dL — ABNORMAL HIGH (ref 0.61–1.24)
GFR calc non Af Amer: 5 mL/min — ABNORMAL LOW (ref >60)
GFR, EST AFRICAN AMERICAN: 6 mL/min — AB (ref >60)
GLUCOSE: 140 mg/dL — AB (ref 65–99)
Phosphorus: 7.1 mg/dL — ABNORMAL HIGH (ref 2.5–4.6)
Potassium: 4.4 mmol/L (ref 3.5–5.1)
SODIUM: 131 mmol/L — AB (ref 135–145)

## 2017-11-02 LAB — GLUCOSE, CAPILLARY
GLUCOSE-CAPILLARY: 107 mg/dL — AB (ref 65–99)
Glucose-Capillary: 216 mg/dL — ABNORMAL HIGH (ref 65–99)
Glucose-Capillary: 233 mg/dL — ABNORMAL HIGH (ref 65–99)
Glucose-Capillary: 161 mg/dL — ABNORMAL HIGH (ref 65–99)

## 2017-11-02 LAB — HEPARIN LEVEL (UNFRACTIONATED): Heparin Unfractionated: 0.43 IU/mL (ref 0.30–0.70)

## 2017-11-02 MED ORDER — CALCITRIOL 0.5 MCG PO CAPS
1.2500 ug | ORAL_CAPSULE | ORAL | Status: AC
  Administered 2017-11-03: 12:00:00 1.25 ug via ORAL
  Filled 2017-11-02: qty 1

## 2017-11-02 MED ORDER — RAMELTEON 8 MG PO TABS
8.0000 mg | ORAL_TABLET | Freq: Every day | ORAL | Status: DC
  Administered 2017-11-02 – 2017-11-03 (×2): 8 mg via ORAL
  Filled 2017-11-02 (×2): qty 1

## 2017-11-02 NOTE — Progress Notes (Signed)
1400-1420 Pt asleep. Awakened pt to do some pre op ed. He stated he is trying to get cousin to bring his prosthesis. He has had it about 3 months. Would recommend PT consult after surgery to work with the pt. He stated he plans to go back to Rehab after discharge. Gave pt IS and encouraged him to use. Demonstrated up to 2000 ml but needs a little more practice to do correctly. Encouraged pt to read materials when more awake. Left OHS booklet, care guide and in the tube handout. Will follow up after surgery. Graylon Good RN BSN 11/02/2017 2:20 PM

## 2017-11-02 NOTE — Progress Notes (Signed)
Progress Note  Patient Name: [name] Siefert Sr. Date of Encounter: 11/02/2017  Primary Cardiologist: Jenkins Rouge, MD   Subjective   Feeling well. No chest pain, sob or palpitations.   Inpatient Medications    Scheduled Meds: . amLODipine  10 mg Oral QHS  . aspirin  324 mg Oral Once  . aspirin  81 mg Oral Daily  . atorvastatin  80 mg Oral q1800  . calcitRIOL  1.25 mcg Oral Q M,W,F-HD  . carvedilol  3.125 mg Oral BID WC  . Chlorhexidine Gluconate Cloth  6 each Topical Q0600  . docusate sodium  100 mg Oral QHS  . feeding supplement (NEPRO CARB STEADY)  237 mL Oral BID BM  . insulin aspart  0-9 Units Subcutaneous TID WC  . insulin glargine  8 Units Subcutaneous QHS  . lamoTRIgine  150 mg Oral QHS  . lanthanum  1,000 mg Oral TID WC  . lisinopril  2.5 mg Oral Daily  . mupirocin ointment  1 application Nasal BID  . nicotine  14 mg Transdermal Daily  . nitroGLYCERIN  0.4 mg Transdermal Daily  . sodium chloride flush  3 mL Intravenous Q12H  . sodium chloride flush  3 mL Intravenous Q12H   Continuous Infusions: . sodium chloride    . heparin 1,300 Units/hr (11/01/17 2250)   PRN Meds: sodium chloride, acetaminophen, diazepam, ipratropium-albuterol, methocarbamol, nitroGLYCERIN, ondansetron (ZOFRAN) IV, ondansetron, polyethylene glycol, sodium chloride flush   Vital Signs    Vitals:   11/01/17 1407 11/01/17 2032 11/02/17 0605 11/02/17 0845  BP: (!) 118/49 (!) 110/38 (!) 150/43 (!) 132/48  Pulse: 84 81 73 84  Resp: 20 20 18    Temp: 98.6 F (37 C) 98.2 F (36.8 C) 98 F (36.7 C)   TempSrc: Oral Oral Oral   SpO2: 97% 100% 100%   Weight:   165 lb 4.8 oz (75 kg)   Height:        Intake/Output Summary (Last 24 hours) at 11/02/2017 0908 Last data filed at 11/02/2017 0545 Gross per 24 hour  Intake 1085.95 ml  Output -  Net 1085.95 ml   Filed Weights   10/31/17 1315 10/31/17 1800 11/02/17 0605  Weight: 175 lb 14.8 oz (79.8 kg) 168 lb 14 oz (76.6 kg) 165 lb 4.8  oz (75 kg)    Telemetry    SR  - Personally Reviewed  ECG    N/A  Physical Exam  GEN:No acute distress.   Neck:No JVD Cardiac:RRR, no murmurs, rubs, or gallops. R groin cath site stable Respiratory:Clear to auscultation bilaterally. QJ:FHLK, nontender, non-distended  MS:No edema; S/p L BKA Neuro:Nonfocal  Psych: Normal affect  Labs    Chemistry Recent Labs  Lab 10/28/17 1529  10/31/17 1329 11/01/17 0720 11/02/17 0326  NA  --    < > 131* 133* 131*  K  --    < > 4.8 4.3 4.4  CL  --    < > 91* 93* 91*  CO2  --    < > 21* 23 23  GLUCOSE  --    < > 175* 132* 140*  BUN  --    < > 67* 37* 63*  CREATININE  --    < > 12.87* 9.03* 10.88*  CALCIUM  --    < > 8.4* 9.4 9.4  PROT 7.1  --   --   --   --   ALBUMIN 3.5   < > 3.2* 3.7 3.7  AST 18  --   --   --   --  ALT 19  --   --   --   --   ALKPHOS 98  --   --   --   --   BILITOT 0.7  --   --   --   --   GFRNONAA  --    < > 4* 6* 5*  GFRAA  --    < > 5* 7* 6*  ANIONGAP  --    < > 19* 17* 17*   < > = values in this interval not displayed.     Hematology Recent Labs  Lab 10/31/17 1329 11/01/17 0720 11/02/17 0326  WBC 5.6 7.4 9.9  RBC 4.64 5.39 4.93  HGB 13.5 16.0 14.3  HCT 39.9 47.6 42.8  MCV 86.0 88.3 86.8  MCH 29.1 29.7 29.0  MCHC 33.8 33.6 33.4  RDW 15.5 16.1* 15.6*  PLT 346 346 402*    Cardiac Enzymes Recent Labs  Lab 10/28/17 2220 10/29/17 0240 10/29/17 1120  TROPONINI 0.10* 0.10* 0.09*    Recent Labs  Lab 10/28/17 1608 10/28/17 1843  TROPIPOC 0.03 0.07     BNPNo results for input(s): BNP, PROBNP in the last 168 hours.   DDimer  Recent Labs  Lab 10/28/17 1529  DDIMER <0.27     Radiology    No results found.  Cardiac Studies   LEFT HEART CATH AND CORONARY ANGIOGRAPHY  Conclusion     Ost 2nd Diag to 2nd Diag lesion is 99% stenosed.  Prox LAD to Mid LAD lesion is 99% stenosed.  Ost 2nd Mrg to 2nd Mrg lesion is 80% stenosed.  Mid Cx to Dist Cx lesion is 100%  stenosed.  Ost RCA to Prox RCA lesion is 20% stenosed.  Mid RCA lesion is 40% stenosed.  Dist RCA-1 lesion is 90% stenosed.  Dist RCA-2 lesion is 80% stenosed.  The left ventricular systolic function is normal.  LV end diastolic pressure is normal.  Severe multivessel CAD with coronary calcification involving all major coronary arteries. There is 99% subtotal occlusion of a small second diagonal branch of the LAD. The mid LAD is very tortuous and has diffuse 95% nodular very calcified stenoses in the mid segment; the circumflex vessel has 80% marginal stenosis and is occluded after this marginal takeoff with collateralization retrograde distally; and a large dominant calcified RCA with 20% proximal 40% mid, and focal 90 and 80% stenoses after the acute margin and before the PDA takeoff.  Mild LV dysfunction with an ejection fraction of 40 to less than 45% with distal anterolateral apical hypocontractility. LVEDP 5-7 mm.  RECOMMENDATION: With the extensive coronary calcification and multivessel CAD recommend surgical consultation for CABG revascularization   Diagnostic Diagram       Echo 10/30/17 Study Conclusions  - Left ventricle: The cavity size was normal. Wall thickness was increased in a pattern of moderate to severe LVH. Systolic function was normal. The estimated ejection fraction was in the range of 50% to 55%. Wall motion was normal; there were no regional wall motion abnormalities. Doppler parameters are consistent with abnormal left ventricular relaxation (grade 1 diastolic dysfunction). - Mitral valve: Moderately calcified annulus.  Patient Profile     42 y.o. male with a hx of DM, ESRD on HD, CAD, HFrEF with improved LVEF, HTN admitted with [place].   Assessment & Plan    1. Unstable angina - Troponin flat trend. Treated with IV heparin. Cath showed  multivessel CAD as noted above. Plan for CABG tentatively on Friday after plavix washout.  No recurrent angina on nitro patch. - Continue ASA, IV heparin, coreg, Lipitor and nitro patch. If again recurrent pain, consider changing nitro patch to IV nitro.   2. HLD - 10/29/2017: Cholesterol 187; HDL 35; LDL Cholesterol 99; Triglycerides 266; VLDL 53  - Continue high dose statin  - Lipid panel and LFT in 6 weeks.    For questions or updates, please contact [place] Please consult [email or web address] for contact info under Cardiology/STEMI.      Signed, Leanor Kail, PA  11/02/2017, 9:08 AM

## 2017-11-02 NOTE — Progress Notes (Addendum)
[facility] Progress Note   Dialysis Orders: [facility] MWF left AVF 4 hrs 200 NRe 500/800 73 kg 2.0K/2.0 Ca UFP 2 -Heparin 2400 unit IV TIW -Calcitriol 1.25 mcg PO TIW PTH 275-Venofer 50 mg IV weekly (last dose 10/26/17)  Assessment/Plan: 1. Chest pain:ACSvs demand ischemia.Troponin peak .10. ST seg depression noted on EKG inferior laterally. Plan for CABG Friday- severe multivessel CAD 2. ESRD -MWF.HD again Thursday due to planned surgery Friday then again Saturday then back on schedule next week 3. Hypertension/volume-Has not been getting to EDW as outpt .net UF 2.9 3/18. with post wt 76.6 -nonstanding. On 10 amlodipine , lisinopril 2.5 and coreg 3.125 bid 4. Anemia - HGB 16No ESA. 5. Metabolic bone disease -Ca 9 P 6 Contiue VDRA/change to non Ca based binders only - prev on fosrenol/ca acetate - if P remains un - will increase Fosrenol to 2 ac. 6. Nutrition -Albumin 3.7Renal/Carb Mod diet, renal vit, nepro 7. DM per primary 8. Disp - patient living at [facility] since last August 9. 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    Myriam Jacobson, PA-C [place] [phone] 11/02/2017,9:37 AM  LOS: 5 days   Pt seen, examined and agree w A/P as above.  Kelly Splinter MD [facility] pager [phone]   11/02/2017, 12:45 PM    Subjective:   Can't sleep at night  Objective Vitals:   11/01/17 1407 11/01/17 2032 11/02/17 0605 11/02/17 0845  BP: (!) 118/49 (!) 110/38 (!) 150/43 (!) 132/48  Pulse: 84 81 73 84  Resp: 20 20 18    Temp: 98.6 F (37 C) 98.2 F (36.8 C) 98 F (36.7 C)   TempSrc: Oral Oral Oral   SpO2: 97% 100% 100%   Weight:   75 kg (165 lb 4.8 oz)   Height:       Physical Exam General: sleeping - wakens easily Heart: RRR Lungs: CTA bilaterally Abdomen: soft NT Extremities: left  BKA and no LE edema Dialysis Access: left AVF + bruit   Additional Objective Labs: Basic Metabolic Panel: Recent Labs  Lab  10/31/17 1329 11/01/17 0720 11/02/17 0326  NA 131* 133* 131*  K 4.8 4.3 4.4  CL 91* 93* 91*  CO2 21* 23 23  GLUCOSE 175* 132* 140*  BUN 67* 37* 63*  CREATININE 12.87* 9.03* 10.88*  CALCIUM 8.4* 9.4 9.4  PHOS 8.9* 6.0* 7.1*   Liver Function Tests: Recent Labs  Lab 10/28/17 1529  10/31/17 1329 11/01/17 0720 11/02/17 0326  AST 18  --   --   --   --   ALT 19  --   --   --   --   ALKPHOS 98  --   --   --   --   BILITOT 0.7  --   --   --   --   PROT 7.1  --   --   --   --   ALBUMIN 3.5   < > 3.2* 3.7 3.7   < > = values in this interval not displayed.   No results for input(s): LIPASE, AMYLASE in the last 168 hours. CBC: Recent Labs  Lab 10/28/17 1530  10/30/17 0412 10/31/17 0614 10/31/17 1329 11/01/17 0720 11/02/17 0326  WBC 7.4   < > 7.4 7.7 5.6 7.4 9.9  NEUTROABS 4.9  --   --   --   --   --   --   HGB 14.0   < > 14.9 14.5 13.5 16.0  14.3  HCT 41.8   < > 43.4 42.8 39.9 47.6 42.8  MCV 86.9   < > 86.1 86.6 86.0 88.3 86.8  PLT 350   < > 386 403* 346 346 402*   < > = values in this interval not displayed.   Blood Culture    Component Value Date/Time   SDES BLOOD RIGHT HAND 03/07/2017 2304   SPECREQUEST  03/07/2017 2304    BOTTLES DRAWN AEROBIC AND ANAEROBIC Blood Culture adequate volume   CULT NO GROWTH 5 DAYS 03/07/2017 2304   REPTSTATUS 03/13/2017 FINAL 03/07/2017 2304    Cardiac Enzymes: Recent Labs  Lab 10/28/17 2220 10/29/17 0240 10/29/17 1120  TROPONINI 0.10* 0.10* 0.09*   CBG: Recent Labs  Lab 11/01/17 0802 11/01/17 1147 11/01/17 1626 11/01/17 2043 11/02/17 0757  GLUCAP 142* 207* 201* 127* 107*   Iron Studies: No results for input(s): IRON, TIBC, TRANSFERRIN, FERRITIN in the last 72 hours. Lab Results  Component Value Date   INR 1.00 10/30/2017   INR 1.05 09/13/2014   INR 1.05 09/27/2013   Studies/Results: No results found. Medications: . sodium chloride    . heparin 1,300 Units/hr (11/01/17 2250)   . amLODipine  10 mg Oral QHS  .  aspirin  324 mg Oral Once  . aspirin  81 mg Oral Daily  . atorvastatin  80 mg Oral q1800  . calcitRIOL  1.25 mcg Oral Q M,W,F-HD  . carvedilol  3.125 mg Oral BID WC  . Chlorhexidine Gluconate Cloth  6 each Topical Q0600  . docusate sodium  100 mg Oral QHS  . feeding supplement (NEPRO CARB STEADY)  237 mL Oral BID BM  . insulin aspart  0-9 Units Subcutaneous TID WC  . insulin glargine  8 Units Subcutaneous QHS  . lamoTRIgine  150 mg Oral QHS  . lanthanum  1,000 mg Oral TID WC  . lisinopril  2.5 mg Oral Daily  . mupirocin ointment  1 application Nasal BID  . nicotine  14 mg Transdermal Daily  . nitroGLYCERIN  0.4 mg Transdermal Daily  . sodium chloride flush  3 mL Intravenous Q12H  . sodium chloride flush  3 mL Intravenous Q12H

## 2017-11-02 NOTE — Progress Notes (Signed)
[place] for Heparin  Indication: chest pain/ACS  Allergies  Allergen Reactions  . Coconut Oil Anaphylaxis    Can use topically, allergic to coconut foods    Patient Measurements: Height: 6\' 2"  (188 cm) Weight: 165 lb 4.8 oz (75 kg) IBW/kg (Calculated) : 82.2  Vital Signs: Temp: 98 F (36.7 C) (03/20 0605) Temp Source: Oral (03/20 0605) BP: 132/48 (03/20 0845) Pulse Rate: 84 (03/20 0845)  Labs: Recent Labs    10/31/17 1329 11/01/17 0000 11/01/17 0720 11/01/17 1117 11/02/17 0326  HGB 13.5  --  16.0  --  14.3  HCT 39.9  --  47.6  --  42.8  PLT 346  --  346  --  402*  HEPARINUNFRC  --  0.28*  --  0.34 0.43  CREATININE 12.87*  --  9.03*  --  10.88*   Estimated Creatinine Clearance: 9.6 mL/min (A) (by C-G formula based on SCr of 10.88 mg/dL (H)).  Assessment: 42 y.o. male with ESRD on HD, has multivessel CAD s/p cath, continues on IV heparin, awaiting poss CABG. Heparin level = 0.43, therapeutic on heparin drip 1300 units/hr. CBC remains wnl. No bleeding noted. Plan for CABG on Friday 11/04/17 per Dr. Roxan Hockey after plavix washout.  Goal of Therapy:  Heparin level 0.3-0.7 units/ml Monitor platelets by anticoagulation protocol: Yes   Plan:  Continue IV Heparin 1300 units/hr Daily heparin level and CBC  Nicole Cella, [place] Clinical Pharmacist Pager: [phone] or [phone] (330p-1030p) Main Rx [phone] 11/02/2017,12:24 PM

## 2017-11-02 NOTE — Progress Notes (Addendum)
Family Medicine Teaching Service Daily Progress Note Intern Pager: [phone]  Patient name: [name] Witkop Sr. Medical record number: [id number] Date of birth: 09/14/1975 Age: 42 y.o. Gender: male  Primary Care Provider: Patient, [name] Per Consultants: Cardiology Code Status: Full   Pt Overview and Major Events to Date:   Assessment and Plan: [name] Mccabe Sr. is a 42 y.o. male presenting with chest pain . PMH is significant for T2DM, CAD, HTN, S/P unilateral BKA, ESRD, HrEF, PAD   CAD with severe 3 vessel dz s/p CATH: Patient was started on heparin drip and Nitro patch. Patient with no chest pain. -Cardiology following, appreciate recs; s/p heart cath 3/18 with recommendation for CABG due to severity - CVTS consulted and following, appreciate recommendations- surgery on Friday - Plavix d/c for CABG, continue heparin - Continue Lipitor 80 mg  T2DM- A1C 12.1. Sliding Scale in Nursing Home -SSI w/ cbgs- 107, 216 -Lantus 8u nightly   Chronic HFrEF, grade 2DD.  ECHO  50-55% with G1DD.  Patient does not appear to be taking meds.  No current edema/SOB orthopnea. Euvolemic on exam. - Will continue Coreg 3.125 BID  - Will continue lisinopril 2.5 mg   HTN:  -home amlodipine 10 -coreg 3.25 mg BID  -lisinopril 2.5 mg   PVD- s/p left BKA -no current complaints/lesions reported  -home plavix  ESRD: on HD MWF. HD today - nephro following for HD, appreciate assistance  - home calcitrol, phoslo  Chronic pain: -home robaxin  Tobacco Abuse  - Nictonine patch  Mood disorder -home lamictal 150  FEN/GI:  renal/carb modified diet  Prophylaxis: heparin drip  Disposition: Pending CABG, plan for return to SNF   Subjective:  Patient sleepy but denies chest pain or SOB.  Objective: Temp:  [98 F (36.7 C)-98.6 F (37 C)] 98 F (36.7 C) (03/20 0605) Pulse Rate:  [73-84] 84 (03/20 0845) Resp:  [18-20] 18 (03/20 0605) BP: (110-150)/(38-49) 132/48 (03/20  0845) SpO2:  [97 %-100 %] 100 % (03/20 0605) Weight:  [165 lb 4.8 oz (75 kg)] 165 lb 4.8 oz (75 kg) (03/20 5852) Physical Exam: General: NAD, pleasant, sleeping, easily awoken ENTM: Moist mucous membranes Cardiovascular: RRR, no m/r/g, no LE edema Respiratory: CTA BL, normal work of breathing Gastrointestinal: soft, nontender, nondistended, normoactive BS MSK: L BKA  Psych: Appropriate affect  Laboratory: Recent Labs  Lab 10/31/17 1329 11/01/17 0720 11/02/17 0326  WBC 5.6 7.4 9.9  HGB 13.5 16.0 14.3  HCT 39.9 47.6 42.8  PLT 346 346 402*   Recent Labs  Lab 10/28/17 1529  10/31/17 1329 11/01/17 0720 11/02/17 0326  NA  --    < > 131* 133* 131*  K  --    < > 4.8 4.3 4.4  CL  --    < > 91* 93* 91*  CO2  --    < > 21* 23 23  BUN  --    < > 67* 37* 63*  CREATININE  --    < > 12.87* 9.03* 10.88*  CALCIUM  --    < > 8.4* 9.4 9.4  PROT 7.1  --   --   --   --   BILITOT 0.7  --   --   --   --   ALKPHOS 98  --   --   --   --   ALT 19  --   --   --   --   AST 18  --   --   --   --  GLUCOSE  --    < > 175* 132* 140*   < > = values in this interval not displayed.   ECHO: Study Conclusions - Left ventricle: The cavity size was normal. Wall thickness was increased in a pattern of moderate to severe LVH. Systolic function was normal. The estimated ejection fraction was in the range of 50% to 55%. Wall motion was normal; there were no regional wall motion abnormalities. Doppler parameters are consistent with abnormal left ventricular relaxation (grade 1 diastolic dysfunction). - Mitral valve: Moderately calcified annulus.  [name], [place], DO 11/02/2017, 9:54 AM PGY-1 [place] Intern pager: [phone], text pages welcome

## 2017-11-03 ENCOUNTER — Inpatient Hospital Stay (HOSPITAL_COMMUNITY): Payer: Medicaid Other

## 2017-11-03 DIAGNOSIS — Z992 Dependence on renal dialysis: Secondary | ICD-10-CM

## 2017-11-03 DIAGNOSIS — I2510 Atherosclerotic heart disease of native coronary artery without angina pectoris: Secondary | ICD-10-CM

## 2017-11-03 DIAGNOSIS — I25110 Atherosclerotic heart disease of native coronary artery with unstable angina pectoris: Secondary | ICD-10-CM

## 2017-11-03 DIAGNOSIS — I10 Essential (primary) hypertension: Secondary | ICD-10-CM

## 2017-11-03 DIAGNOSIS — N186 End stage renal disease: Secondary | ICD-10-CM

## 2017-11-03 DIAGNOSIS — E1122 Type 2 diabetes mellitus with diabetic chronic kidney disease: Secondary | ICD-10-CM

## 2017-11-03 LAB — RENAL FUNCTION PANEL
ALBUMIN: 3.5 g/dL (ref 3.5–5.0)
Anion gap: 19 — ABNORMAL HIGH (ref 5–15)
BUN: 90 mg/dL — AB (ref 6–20)
CALCIUM: 8.9 mg/dL (ref 8.9–10.3)
CO2: 24 mmol/L (ref 22–32)
CREATININE: 13.42 mg/dL — AB (ref 0.61–1.24)
Chloride: 87 mmol/L — ABNORMAL LOW (ref 101–111)
GFR calc Af Amer: 5 mL/min — ABNORMAL LOW (ref >60)
GFR, EST NON AFRICAN AMERICAN: 4 mL/min — AB (ref >60)
Glucose, Bld: 130 mg/dL — ABNORMAL HIGH (ref 65–99)
Phosphorus: 6.5 mg/dL — ABNORMAL HIGH (ref 2.5–4.6)
Potassium: 4.8 mmol/L (ref 3.5–5.1)
SODIUM: 130 mmol/L — AB (ref 135–145)

## 2017-11-03 LAB — CBC
HCT: 42.3 % (ref 39.0–52.0)
Hemoglobin: 14.2 g/dL (ref 13.0–17.0)
MCH: 29.2 pg (ref 26.0–34.0)
MCHC: 33.6 g/dL (ref 30.0–36.0)
MCV: 86.9 fL (ref 78.0–100.0)
PLATELETS: 383 10*3/uL (ref 150–400)
RBC: 4.87 MIL/uL (ref 4.22–5.81)
RDW: 15.5 % (ref 11.5–15.5)
WBC: 8.3 10*3/uL (ref 4.0–10.5)

## 2017-11-03 LAB — HEPATITIS B SURFACE ANTIGEN: HEP B S AG: NEGATIVE

## 2017-11-03 LAB — GLUCOSE, CAPILLARY
GLUCOSE-CAPILLARY: 188 mg/dL — AB (ref 65–99)
GLUCOSE-CAPILLARY: 158 mg/dL — AB (ref 65–99)
Glucose-Capillary: 99 mg/dL (ref 65–99)

## 2017-11-03 LAB — PULMONARY FUNCTION TEST
FEF 25-75 Pre: 1.76 L/sec
FEF2575-%Pred-Pre: 42 %
FEV1-%Pred-Pre: 62 %
FEV1-PRE: 2.54 L
FEV1FVC-%Pred-Pre: 87 %
FEV6-%PRED-PRE: 71 %
FEV6-PRE: 3.51 L
FEV6FVC-%Pred-Pre: 101 %
FVC-%PRED-PRE: 70 %
FVC-PRE: 3.52 L
PRE FEV6/FVC RATIO: 100 %
Pre FEV1/FVC ratio: 72 %

## 2017-11-03 LAB — HEPARIN LEVEL (UNFRACTIONATED)
HEPARIN UNFRACTIONATED: 1.98 [IU]/mL — AB (ref 0.30–0.70)
Heparin Unfractionated: 0.33 IU/mL (ref 0.30–0.70)

## 2017-11-03 MED ORDER — POTASSIUM CHLORIDE 2 MEQ/ML IV SOLN
80.0000 meq | INTRAVENOUS | Status: DC
  Filled 2017-11-03: qty 40

## 2017-11-03 MED ORDER — ALTEPLASE 2 MG IJ SOLR: 2.0000 mg | Freq: Once | INTRAMUSCULAR | Status: DC | PRN

## 2017-11-03 MED ORDER — SODIUM CHLORIDE 0.9 % IV SOLN
30.0000 ug/min | INTRAVENOUS | Status: AC
  Administered 2017-11-04: 20 ug/min via INTRAVENOUS
  Filled 2017-11-03: qty 2

## 2017-11-03 MED ORDER — HEPARIN SODIUM (PORCINE) 1000 UNIT/ML DIALYSIS: 1000.0000 [IU] | INTRAMUSCULAR | Status: DC | PRN

## 2017-11-03 MED ORDER — SODIUM CHLORIDE 0.9 % IV SOLN
750.0000 mg | INTRAVENOUS | Status: DC
  Filled 2017-11-03: qty 750

## 2017-11-03 MED ORDER — SODIUM CHLORIDE 0.9 % IV SOLN: 100.0000 mL | INTRAVENOUS | Status: DC | PRN

## 2017-11-03 MED ORDER — LIDOCAINE HCL (PF) 1 % IJ SOLN: 5.0000 mL | INTRAMUSCULAR | Status: DC | PRN

## 2017-11-03 MED ORDER — TRANEXAMIC ACID (OHS) PUMP PRIME SOLUTION
2.0000 mg/kg | INTRAVENOUS | Status: DC
  Filled 2017-11-03: qty 1.56

## 2017-11-03 MED ORDER — HEPARIN SODIUM (PORCINE) 1000 UNIT/ML IJ SOLN
INTRAMUSCULAR | Status: DC
  Filled 2017-11-03: qty 30

## 2017-11-03 MED ORDER — LIDOCAINE-PRILOCAINE 2.5-2.5 % EX CREA: 1.0000 "application " | TOPICAL_CREAM | CUTANEOUS | Status: DC | PRN

## 2017-11-03 MED ORDER — DIAZEPAM 5 MG PO TABS
5.0000 mg | ORAL_TABLET | Freq: Once | ORAL | Status: AC
  Administered 2017-11-04: 5 mg via ORAL
  Filled 2017-11-03: qty 1

## 2017-11-03 MED ORDER — CHLORHEXIDINE GLUCONATE 0.12 % MT SOLN
15.0000 mL | Freq: Once | OROMUCOSAL | Status: AC
  Administered 2017-11-04: 15 mL via OROMUCOSAL
  Filled 2017-11-03 (×2): qty 15

## 2017-11-03 MED ORDER — NITROGLYCERIN IN D5W 200-5 MCG/ML-% IV SOLN
2.0000 ug/min | INTRAVENOUS | Status: DC
  Filled 2017-11-03: qty 250

## 2017-11-03 MED ORDER — MORPHINE SULFATE (PF) 4 MG/ML IV SOLN
INTRAVENOUS | Status: AC
  Administered 2017-11-03: 4 mg
  Filled 2017-11-03: qty 1

## 2017-11-03 MED ORDER — EPINEPHRINE PF 1 MG/ML IJ SOLN
0.0000 ug/min | INTRAVENOUS | Status: DC
  Filled 2017-11-03: qty 4

## 2017-11-03 MED ORDER — TRANEXAMIC ACID (OHS) BOLUS VIA INFUSION
15.0000 mg/kg | INTRAVENOUS | Status: AC
Start: 2017-11-04 — End: 2017-11-04
  Administered 2017-11-04: 780 mg via INTRAVENOUS
  Filled 2017-11-03: qty 1170

## 2017-11-03 MED ORDER — CHLORHEXIDINE GLUCONATE CLOTH 2 % EX PADS
6.0000 | MEDICATED_PAD | Freq: Once | CUTANEOUS | Status: AC
  Administered 2017-11-04: 6 via TOPICAL

## 2017-11-03 MED ORDER — CHLORHEXIDINE GLUCONATE CLOTH 2 % EX PADS
6.0000 | MEDICATED_PAD | Freq: Once | CUTANEOUS | Status: AC
  Administered 2017-11-03: 6 via TOPICAL

## 2017-11-03 MED ORDER — DEXMEDETOMIDINE HCL IN NACL 400 MCG/100ML IV SOLN
0.1000 ug/kg/h | INTRAVENOUS | Status: AC
  Administered 2017-11-04: .4 ug/kg/h via INTRAVENOUS
  Filled 2017-11-03: qty 100

## 2017-11-03 MED ORDER — SODIUM CHLORIDE 0.9 % IV SOLN
INTRAVENOUS | Status: AC
  Administered 2017-11-04: 2 [IU]/h via INTRAVENOUS
  Filled 2017-11-03: qty 1

## 2017-11-03 MED ORDER — DOPAMINE-DEXTROSE 3.2-5 MG/ML-% IV SOLN
0.0000 ug/kg/min | INTRAVENOUS | Status: AC
  Administered 2017-11-04: 3 ug/kg/min via INTRAVENOUS
  Filled 2017-11-03: qty 250

## 2017-11-03 MED ORDER — PENTAFLUOROPROP-TETRAFLUOROETH EX AERO: 1.0000 "application " | INHALATION_SPRAY | CUTANEOUS | Status: DC | PRN

## 2017-11-03 MED ORDER — SODIUM CHLORIDE 0.9 % IV BOLUS (SEPSIS)
250.0000 mL | INTRAVENOUS | Status: AC
  Administered 2017-11-03: 250 mL via INTRAVENOUS

## 2017-11-03 MED ORDER — HEPARIN SODIUM (PORCINE) 1000 UNIT/ML DIALYSIS: 20.0000 [IU]/kg | INTRAMUSCULAR | Status: DC | PRN

## 2017-11-03 MED ORDER — NITROGLYCERIN IN D5W 200-5 MCG/ML-% IV SOLN
0.0000 ug/min | INTRAVENOUS | Status: DC
  Administered 2017-11-03: 5 ug/min via INTRAVENOUS
  Administered 2017-11-04: 20 ug/min via INTRAVENOUS
  Filled 2017-11-03: qty 250

## 2017-11-03 MED ORDER — SODIUM CHLORIDE 0.9 % IV SOLN
1.5000 g | INTRAVENOUS | Status: AC
  Administered 2017-11-04: 1.5 g via INTRAVENOUS
  Administered 2017-11-04: 750 g via INTRAVENOUS
  Filled 2017-11-03: qty 1.5

## 2017-11-03 MED ORDER — TEMAZEPAM 15 MG PO CAPS: 15.0000 mg | ORAL_CAPSULE | Freq: Once | ORAL | Status: DC | PRN

## 2017-11-03 MED ORDER — PAPAVERINE HCL 30 MG/ML IJ SOLN
INTRAMUSCULAR | Status: AC
  Administered 2017-11-04: 09:00:00
  Filled 2017-11-03: qty 2.5

## 2017-11-03 MED ORDER — ALPRAZOLAM 0.25 MG PO TABS: 0.2500 mg | ORAL_TABLET | ORAL | Status: DC | PRN

## 2017-11-03 MED ORDER — VANCOMYCIN HCL 10 G IV SOLR
1250.0000 mg | INTRAVENOUS | Status: AC
  Administered 2017-11-04: 1250 mg via INTRAVENOUS
  Filled 2017-11-03: qty 1250

## 2017-11-03 MED ORDER — MAGNESIUM SULFATE 50 % IJ SOLN
40.0000 meq | INTRAMUSCULAR | Status: DC
  Filled 2017-11-03: qty 9.85

## 2017-11-03 MED ORDER — TRANEXAMIC ACID 1000 MG/10ML IV SOLN
1.5000 mg/kg/h | INTRAVENOUS | Status: AC
  Administered 2017-11-04: 1.5 mg/kg/h via INTRAVENOUS
  Filled 2017-11-03: qty 25

## 2017-11-03 MED ORDER — MILRINONE LACTATE IN DEXTROSE 20-5 MG/100ML-% IV SOLN
0.1250 ug/kg/min | INTRAVENOUS | Status: DC
  Filled 2017-11-03: qty 100

## 2017-11-03 NOTE — Progress Notes (Signed)
Received pt from [place], RN, HD tx was initiated @ 1749 via 15Gx2 w/o problem per report by Kateri Mc, RN, VSS while pt is still on nitro drip for cp. Pt currently states he is cp free. Will cont to monitor while on HD tx

## 2017-11-03 NOTE — Anesthesia Preprocedure Evaluation (Addendum)
Anesthesia Evaluation  Patient identified by MRN, date of birth, ID band Patient awake    Reviewed: Allergy & Precautions, NPO status , Patient's Chart, lab work & pertinent test results, reviewed documented beta blocker date and time   History of Anesthesia Complications (+) PONV  Airway Mallampati: II  TM Distance: >3 FB Neck ROM: Full    Dental  (+) Dental Advisory Given   Pulmonary Current Smoker,    breath sounds clear to auscultation       Cardiovascular hypertension, Pt. on medications and Pt. on home beta blockers + angina + CAD, + Peripheral Vascular Disease and +CHF   Rhythm:Regular Rate:Normal  Study Conclusions 10/30/17 - Left ventricle: The cavity size was normal. Wall thickness was increased in a pattern of moderate to severe LVH. Systolic function was normal. The estimated ejection fraction was in the range of 50% to 55%. Wall motion was normal; there were no regional wall motion abnormalities. Doppler parameters are  consistent with abnormal left ventricular relaxation (grade 1  diastolic dysfunction). - Mitral valve: Moderately calcified annulus.   Neuro/Psych negative neurological ROS     GI/Hepatic GERD  ,  Endo/Other  diabetes, Type 2, Insulin Dependent  Renal/GU ESRF and DialysisRenal disease     Musculoskeletal   Abdominal   Peds  Hematology  (+) Blood dyscrasia (Plavix washout), ,   Anesthesia Other Findings   Reproductive/Obstetrics                           Lab Results  Component Value Date   WBC 8.3 11/03/2017   HGB 14.2 11/03/2017   HCT 42.3 11/03/2017   MCV 86.9 11/03/2017   PLT 383 11/03/2017   Lab Results  Component Value Date   CREATININE 13.42 (H) 11/03/2017   BUN 90 (H) 11/03/2017   NA 130 (L) 11/03/2017   K 4.8 11/03/2017   CL 87 (L) 11/03/2017   CO2 24 11/03/2017   Lab Results  Component Value Date   INR 1.00 10/30/2017   INR 1.05 09/13/2014    INR 1.05 09/27/2013    Anesthesia Physical Anesthesia Plan  ASA: IV  Anesthesia Plan: General   Post-op Pain Management:    Induction: Intravenous  PONV Risk Score and Plan: 2 and Ondansetron, Dexamethasone and Treatment may vary due to age or medical condition  Airway Management Planned: Oral ETT  Additional Equipment: Arterial line, CVP, TEE, Ultrasound Guidance Line Placement and PA Cath  Intra-op Plan:   Post-operative Plan: Post-operative intubation/ventilation  Informed Consent: I have reviewed the patients History and Physical, chart, labs and discussed the procedure including the risks, benefits and alternatives for the proposed anesthesia with the patient or authorized representative who has indicated his/her understanding and acceptance.   Dental advisory given  Plan Discussed with: CRNA  Anesthesia Plan Comments:        Anesthesia Quick Evaluation

## 2017-11-03 NOTE — Progress Notes (Signed)
Pt had episode of 10/10 chest pain this am which resolved with sublingual nitrogen X3, EKG obtained , 02 BNC applied,  Md  notified , IV nitro orders received and implemented, CP reoccurred this after noon, BP 90s/50s, MD notified, orders for NS bolus and X1 morphine IVP obtained and implemented, nitro titrated with improved BP, pt continues to report feeling of mild pressure, but denies associated pain, VSS at present.  Edward Qualia RN

## 2017-11-03 NOTE — H&P (View-Only) (Signed)
3 Days Post-Op Procedure(s) (LRB): LEFT HEART CATH AND CORONARY ANGIOGRAPHY (N/A) Subjective: No complaints this AM Had 2 episodes of angina last night - both resolved with SL NTG Now on heparin and NTG drips  Objective: Vital signs in last 24 hours: Temp:  [97.7 F (36.5 C)-98.4 F (36.9 C)] 97.7 F (36.5 C) (03/21 0533) Pulse Rate:  [68-84] 68 (03/21 0533) Cardiac Rhythm: Normal sinus rhythm (03/20 2026) BP: (123-143)/(45-105) 137/55 (03/21 0533) SpO2:  [96 %-100 %] 100 % (03/21 0533) Weight:  [172 lb (78 kg)] 172 lb (78 kg) (03/21 0533)  Hemodynamic parameters for last 24 hours:    Intake/Output from previous day: 03/20 0701 - 03/21 0700 In: 1362.5 [P.O.:1080; I.V.:282.5] Out: -  Intake/Output this shift: No intake/output data recorded.  General appearance: alert, cooperative and no distress Neurologic: intact Heart: regular rate and rhythm Lungs: clear to auscultation bilaterally  Lab Results: Recent Labs    11/02/17 0326 11/03/17 0412  WBC 9.9 8.3  HGB 14.3 14.2  HCT 42.8 42.3  PLT 402* 383   BMET:  Recent Labs    11/02/17 0326 11/03/17 0412  NA 131* 130*  K 4.4 4.8  CL 91* 87*  CO2 23 24  GLUCOSE 140* 130*  BUN 63* 90*  CREATININE 10.88* 13.42*  CALCIUM 9.4 8.9    PT/INR: No results for input(s): LABPROT, INR in the last 72 hours. ABG    Component Value Date/Time   HCO3 28.1 (H) 06/04/2017 1020   TCO2 29 06/04/2017 1020   O2SAT 85.0 06/04/2017 1020   CBG (last 3)  Recent Labs    11/02/17 1640 11/02/17 2142 11/03/17 0735  GLUCAP 233* 161* 99    Assessment/Plan: S/P Procedure(s) (LRB): LEFT HEART CATH AND CORONARY ANGIOGRAPHY (N/A) Severe 3 vessel CAD with unstable angina For CABG in AM Will only be 4 days off plavix but I don't think he'll be able to wait until next week. He is aware of higher bleeding risk but is willing to accept that All questions answered   LOS: 6 days    Melrose Nakayama 11/03/2017

## 2017-11-03 NOTE — Plan of Care (Signed)
  Activity: Risk for activity intolerance will decrease 11/03/2017 0051 - Progressing by Theora Gianotti, RN  Pt independent in bed mobility. Ambulates with steady gait with front wheel walker with standby assistance

## 2017-11-03 NOTE — Progress Notes (Addendum)
[facility] Progress Note   Dialysis Orders: [facility] MWF 4h  F200  500/800  73kg   2/2 bath  LUE AVF  Hep 2400 -Calcitriol 1.25 mcg PO TIWPTH 275 -Venofer 50 mg IV weekly (last dose 10/26/17)  Assessment/Plan: 1. Chest pain:ACSvs demand ischemia.Troponin peak .10. ST seg depression noted on EKG inferior laterally.Plan for CABG Friday- severe 3-vessell CAD; having intermittent CP 2. ESRD -MWF.off schedule planHD today due to planned surgery Friday then again Saturday then back on schedule next week 3. Hypertension/volume-Has not been getting to EDW as outpt .net UF 2.9 3/18. with post wt 76.6 -nonstanding. On 10 amlodipine , lisinopril 2.5 and coreg 3.125 bid 4. Anemia - HGB 14.2No ESA. 5. Metabolic bone disease -Ca 9 P 6Contiue VDRA/change to non Ca based binders only - prev on fosrenol/ca acetate - if P remains un - will increase Fosrenol to 2 ac. 6. Nutrition -Albumin 3.5Renal/Carb Mod diet, renal vit, nepro 7. DM per primary 8. Disp - patient living at [facility] since last August 9. 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  Myriam Jacobson, PA-C [facility] Beeper [phone] 11/03/2017,9:24 AM  LOS: 6 days   Pt seen, examined and agree w A/P as above. For HD today in prep for CABG tomorrow.  Kelly Splinter MD [employer] pager [phone]   11/03/2017, 11:45 AM    Subjective:   Had CP this am and last pm - required 3 NTG each time. Wearing O2 now. C/o food.  Objective Vitals:   11/02/17 2317 11/02/17 2322 11/02/17 2326 11/03/17 0533  BP: (!) 137/105 (!) 143/87 123/74 (!) 137/55  Pulse:    68  Resp:      Temp:    97.7 F (36.5 C)  TempSrc:    Oral  SpO2:    100%  Weight:    78 kg (172 lb)  Height:       Physical Exam General: slender male NAD at present on O@ Heart: RRR Lungs: no rales Abdomen: soft NT Extremities: left BKA no LE edema Dialysis Access: left AVF + bruit  Additional Objective Labs: Basic Metabolic  Panel: Recent Labs  Lab 11/01/17 0720 11/02/17 0326 11/03/17 0412  NA 133* 131* 130*  K 4.3 4.4 4.8  CL 93* 91* 87*  CO2 23 23 24   GLUCOSE 132* 140* 130*  BUN 37* 63* 90*  CREATININE 9.03* 10.88* 13.42*  CALCIUM 9.4 9.4 8.9  PHOS 6.0* 7.1* 6.5*   Liver Function Tests: Recent Labs  Lab 10/28/17 1529  11/01/17 0720 11/02/17 0326 11/03/17 0412  AST 18  --   --   --   --   ALT 19  --   --   --   --   ALKPHOS 98  --   --   --   --   BILITOT 0.7  --   --   --   --   PROT 7.1  --   --   --   --   ALBUMIN 3.5   < > 3.7 3.7 3.5   < > = values in this interval not displayed.   No results for input(s): LIPASE, AMYLASE in the last 168 hours. CBC: Recent Labs  Lab 10/28/17 1530  10/31/17 0614 10/31/17 1329 11/01/17 0720 11/02/17 0326 11/03/17 0412  WBC 7.4   < > 7.7 5.6 7.4 9.9 8.3  NEUTROABS 4.9  --   --   --   --   --   --  HGB 14.0   < > 14.5 13.5 16.0 14.3 14.2  HCT 41.8   < > 42.8 39.9 47.6 42.8 42.3  MCV 86.9   < > 86.6 86.0 88.3 86.8 86.9  PLT 350   < > 403* 346 346 402* 383   < > = values in this interval not displayed.   Blood Culture    Component Value Date/Time   SDES BLOOD RIGHT HAND 03/07/2017 2304   SPECREQUEST  03/07/2017 2304    BOTTLES DRAWN AEROBIC AND ANAEROBIC Blood Culture adequate volume   CULT NO GROWTH 5 DAYS 03/07/2017 2304   REPTSTATUS 03/13/2017 FINAL 03/07/2017 2304    Cardiac Enzymes: Recent Labs  Lab 10/28/17 2220 10/29/17 0240 10/29/17 1120  TROPONINI 0.10* 0.10* 0.09*   CBG: Recent Labs  Lab 11/02/17 0757 11/02/17 1117 11/02/17 1640 11/02/17 2142 11/03/17 0735  GLUCAP 107* 216* 233* 161* 99   Iron Studies: No results for input(s): IRON, TIBC, TRANSFERRIN, FERRITIN in the last 72 hours. Lab Results  Component Value Date   INR 1.00 10/30/2017   INR 1.05 09/13/2014   INR 1.05 09/27/2013   Studies/Results: No results found. Medications: . heparin 1,300 Units/hr (11/03/17 0329)   . amLODipine  10 mg Oral QHS  .  aspirin  81 mg Oral Daily  . atorvastatin  80 mg Oral q1800  . calcitRIOL  1.25 mcg Oral Q M,W,F-HD  . calcitRIOL  1.25 mcg Oral Q Thu-HD  . carvedilol  3.125 mg Oral BID WC  . Chlorhexidine Gluconate Cloth  6 each Topical Q0600  . docusate sodium  100 mg Oral QHS  . feeding supplement (NEPRO CARB STEADY)  237 mL Oral BID BM  . insulin aspart  0-9 Units Subcutaneous TID WC  . insulin glargine  8 Units Subcutaneous QHS  . lamoTRIgine  150 mg Oral QHS  . lanthanum  1,000 mg Oral TID WC  . lisinopril  2.5 mg Oral Daily  . mupirocin ointment  1 application Nasal BID  . nicotine  14 mg Transdermal Daily  . nitroGLYCERIN  0.4 mg Transdermal Daily  . ramelteon  8 mg Oral QHS

## 2017-11-03 NOTE — Progress Notes (Signed)
Progress Note  Patient Name: [name] Virgin Sr. Date of Encounter: 11/03/2017  Primary Cardiologist: Jenkins Rouge, MD   Subjective   Had 2 episode of angina last night.   Inpatient Medications    Scheduled Meds: . amLODipine  10 mg Oral QHS  . aspirin  81 mg Oral Daily  . atorvastatin  80 mg Oral q1800  . calcitRIOL  1.25 mcg Oral Q M,W,F-HD  . calcitRIOL  1.25 mcg Oral Q Thu-HD  . carvedilol  3.125 mg Oral BID WC  . Chlorhexidine Gluconate Cloth  6 each Topical Q0600  . docusate sodium  100 mg Oral QHS  . feeding supplement (NEPRO CARB STEADY)  237 mL Oral BID BM  . [START ON 11/04/2017] heparin-papaverine-plasmalyte irrigation   Irrigation To OR  . insulin aspart  0-9 Units Subcutaneous TID WC  . insulin glargine  8 Units Subcutaneous QHS  . lamoTRIgine  150 mg Oral QHS  . lanthanum  1,000 mg Oral TID WC  . lisinopril  2.5 mg Oral Daily  . [START ON 11/04/2017] magnesium sulfate  40 mEq Other To OR  . mupirocin ointment  1 application Nasal BID  . nicotine  14 mg Transdermal Daily  . [START ON 11/04/2017] potassium chloride  80 mEq Other To OR  . ramelteon  8 mg Oral QHS  . [START ON 11/04/2017] tranexamic acid  15 mg/kg Intravenous To OR  . [START ON 11/04/2017] tranexamic acid  2 mg/kg Intracatheter To OR   Continuous Infusions: . [START ON 11/04/2017] cefUROXime (ZINACEF)  IV    . [START ON 11/04/2017] cefUROXime (ZINACEF)  IV    . [START ON 11/04/2017] dexmedetomidine    . [START ON 11/04/2017] DOPamine    . [START ON 11/04/2017] epinephrine    . [START ON 11/04/2017] heparin 30,000 units/NS 1000 mL solution for CELLSAVER    . heparin 1,300 Units/hr (11/03/17 0329)  . [START ON 11/04/2017] insulin (NOVOLIN-R) infusion    . [START ON 11/04/2017] milrinone    . [START ON 11/04/2017] nitroGLYCERIN    . nitroGLYCERIN    . [START ON 11/04/2017] phenylephrine 20mg /251mL NS (0.08mg /ml) infusion    . [START ON 11/04/2017] tranexamic acid (CYKLOKAPRON) infusion (OHS)    .  [START ON 11/04/2017] vancomycin     PRN Meds: acetaminophen, ipratropium-albuterol, methocarbamol, nitroGLYCERIN, ondansetron (ZOFRAN) IV, ondansetron, polyethylene glycol   Vital Signs    Vitals:   11/02/17 2317 11/02/17 2322 11/02/17 2326 11/03/17 0533  BP: (!) 137/105 (!) 143/87 123/74 (!) 137/55  Pulse:    68  Resp:      Temp:    97.7 F (36.5 C)  TempSrc:    Oral  SpO2:    100%  Weight:    172 lb (78 kg)  Height:        Intake/Output Summary (Last 24 hours) at 11/03/2017 0931 Last data filed at 11/03/2017 0329 Gross per 24 hour  Intake 1122.53 ml  Output -  Net 1122.53 ml   Filed Weights   10/31/17 1800 11/02/17 0605 11/03/17 0533  Weight: 168 lb 14 oz (76.6 kg) 165 lb 4.8 oz (75 kg) 172 lb (78 kg)    Telemetry    SR with normal rate - Personally Reviewed  ECG    SR with TWI laterally - Personally Reviewed  Physical Exam   GEN: No acute distress.   Neck: No JVD Cardiac: RRR, no murmurs, rubs, or gallops.  Respiratory: Clear to auscultation bilaterally. GI: Soft, nontender, non-distended  MS: No edema; S/p L BKA Neuro:  Nonfocal  Psych: Normal affect   Labs    Chemistry Recent Labs  Lab 10/28/17 1529  11/01/17 0720 11/02/17 0326 11/03/17 0412  NA  --    < > 133* 131* 130*  K  --    < > 4.3 4.4 4.8  CL  --    < > 93* 91* 87*  CO2  --    < > 23 23 24   GLUCOSE  --    < > 132* 140* 130*  BUN  --    < > 37* 63* 90*  CREATININE  --    < > 9.03* 10.88* 13.42*  CALCIUM  --    < > 9.4 9.4 8.9  PROT 7.1  --   --   --   --   ALBUMIN 3.5   < > 3.7 3.7 3.5  AST 18  --   --   --   --   ALT 19  --   --   --   --   ALKPHOS 98  --   --   --   --   BILITOT 0.7  --   --   --   --   GFRNONAA  --    < > 6* 5* 4*  GFRAA  --    < > 7* 6* 5*  ANIONGAP  --    < > 17* 17* 19*   < > = values in this interval not displayed.     Hematology Recent Labs  Lab 11/01/17 0720 11/02/17 0326 11/03/17 0412  WBC 7.4 9.9 8.3  RBC 5.39 4.93 4.87  HGB 16.0 14.3 14.2    HCT 47.6 42.8 42.3  MCV 88.3 86.8 86.9  MCH 29.7 29.0 29.2  MCHC 33.6 33.4 33.6  RDW 16.1* 15.6* 15.5  PLT 346 402* 383    Cardiac Enzymes Recent Labs  Lab 10/28/17 2220 10/29/17 0240 10/29/17 1120  TROPONINI 0.10* 0.10* 0.09*    Recent Labs  Lab 10/28/17 1608 10/28/17 1843  TROPIPOC 0.03 0.07     BNPNo results for input(s): BNP, PROBNP in the last 168 hours.   DDimer  Recent Labs  Lab 10/28/17 1529  DDIMER <0.27     Radiology    No results found.  Cardiac Studies   LEFT HEART CATH AND CORONARY ANGIOGRAPHY  Conclusion     Ost 2nd Diag to 2nd Diag lesion is 99% stenosed.  Prox LAD to Mid LAD lesion is 99% stenosed.  Ost 2nd Mrg to 2nd Mrg lesion is 80% stenosed.  Mid Cx to Dist Cx lesion is 100% stenosed.  Ost RCA to Prox RCA lesion is 20% stenosed.  Mid RCA lesion is 40% stenosed.  Dist RCA-1 lesion is 90% stenosed.  Dist RCA-2 lesion is 80% stenosed.  The left ventricular systolic function is normal.  LV end diastolic pressure is normal.  Severe multivessel CAD with coronary calcification involving all major coronary arteries. There is 99% subtotal occlusion of a small second diagonal branch of the LAD. The mid LAD is very tortuous and has diffuse 95% nodular very calcified stenoses in the mid segment; the circumflex vessel has 80% marginal stenosis and is occluded after this marginal takeoff with collateralization retrograde distally; and a large dominant calcified RCA with 20% proximal 40% mid, and focal 90 and 80% stenoses after the acute margin and before the PDA takeoff.  Mild LV dysfunction with an ejection fraction of 40 to  less than 45% with distal anterolateral apical hypocontractility. LVEDP 5-7 mm.  RECOMMENDATION: With the extensive coronary calcification and multivessel CAD recommend surgical consultation for CABG revascularization   Diagnostic Diagram       Echo 10/30/17 Study Conclusions  - Left ventricle:  The cavity size was normal. Wall thickness was increased in a pattern of moderate to severe LVH. Systolic function was normal. The estimated ejection fraction was in the range of 50% to 55%. Wall motion was normal; there were no regional wall motion abnormalities. Doppler parameters are consistent with abnormal left ventricular relaxation (grade 1 diastolic dysfunction). - Mitral valve: Moderately calcified annulus.  Patient Profile   42 y.o.malewith a hx of DM, ESRD on HD, CAD, HFrEF with improved LVEF, HTNadmitted with [place].  Assessment & Plan   1. Unstable angina - Troponin flat trend. Treated with IV heparin. Cath showedmultivessel CADas noted above. Plan for CABG tentatively on Friday after plavix washout. - Continue ASA, IV heparin, coreg, Lipitor. - Had 2 episode of angina last night required 3 SL nitro each time. He did had vomiting with first episode. Currently stable. Will start nitro gtt. DC patch.  2. HLD -10/29/2017: Cholesterol 187; HDL 35; LDL Cholesterol 99; Triglycerides 266; VLDL 53 - Continue high dose statin - Lipid panel and LFT in 6 weeks.     For questions or updates, please contact [facility] [facility] Please consult [email or web address] for contact info under Cardiology/STEMI.      Jarrett Soho, PA  11/03/2017, 9:31 AM

## 2017-11-03 NOTE — Progress Notes (Addendum)
Family Medicine Teaching Service Daily Progress Note Intern Pager: [phone]  Patient name: [name] Faro Sr. Medical record number: [id number] Date of birth: 10-20-1975 Age: 42 y.o. Gender: male  Primary Care Provider: Patient, [name] Per Consultants: Cardiology Code Status: Full   Pt Overview and Major Events to Date:   Assessment and Plan: [name] Ziemann Sr. is a 42 y.o. male presenting with chest pain . PMH is significant for T2DM, CAD, HTN, S/P unilateral BKA, ESRD, HrEF, PAD   CAD with severe 3 vessel dz s/p CATH: Patient was started on heparin drip and Nitro patch. Patient with 2 episodes of chest pain last night requiring 3SL nitro x2.  -Cardiology following, appreciate recs: starting nitro drip and d/c patch - CVTS consulted and following, appreciate recommendations- CABG on Friday - Plavix d/c for CABG, continue heparin - Continue Lipitor 80 mg  T2DM- A1C 12.1. Sliding Scale in Nursing Home -SSI w/ cbgs- 107, 216 -Lantus 8u nightly   Chronic HFrEF, grade 2DD.  ECHO EF 50-55% with G1DD.  Patient does not appear to be taking meds.  No current edema/SOB orthopnea. Euvolemic on exam. - Will continue Coreg 3.125 BID  - Will continue lisinopril 2.5 mg   HTN:  -home amlodipine 10 -coreg 3.25 mg BID  -lisinopril 2.5 mg   PVD- s/p left BKA -no current complaints/lesions reported  -home plavix  ESRD: on HD MWF. HD today - nephro following for HD, appreciate assistance  - home calcitrol, phoslo  Chronic pain: -home robaxin  Tobacco Abuse  - Nictonine patch  Mood disorder -home lamictal 150  Insomnia: likely due to being in hospital - Ramelteon nightly  FEN/GI: renal/carb modified diet  Prophylaxis: heparin drip  Disposition: Pending CABG, plan for return to SNF   Subjective:  Patient did not sleep well today. He slept well yesterday during the day but has his schedule backwards. 2 episodes of chest pain last night resolved with  nitro  Objective: Temp:  [97.7 F (36.5 C)-98.4 F (36.9 C)] 97.7 F (36.5 C) (03/21 0533) Pulse Rate:  [68-84] 68 (03/21 0533) BP: (123-143)/(45-105) 137/55 (03/21 0533) SpO2:  [96 %-100 %] 100 % (03/21 0533) Weight:  [172 lb (78 kg)] 172 lb (78 kg) (03/21 0533) Physical Exam: General: NAD, pleasant, L BKA ENTM: Moist mucous membranes Cardiovascular: RRR, no m/r/g, no LE edema Respiratory: CTA BL, normal work of breathing Gastrointestinal: soft, nontender, nondistended Derm: no rashes appreciated Neuro: CN II-XII grossly intact Psych: AOx3, appropriate affect  Laboratory: Recent Labs  Lab 11/01/17 0720 11/02/17 0326 11/03/17 0412  WBC 7.4 9.9 8.3  HGB 16.0 14.3 14.2  HCT 47.6 42.8 42.3  PLT 346 402* 383   Recent Labs  Lab 10/28/17 1529  11/01/17 0720 11/02/17 0326 11/03/17 0412  NA  --    < > 133* 131* 130*  K  --    < > 4.3 4.4 4.8  CL  --    < > 93* 91* 87*  CO2  --    < > 23 23 24   BUN  --    < > 37* 63* 90*  CREATININE  --    < > 9.03* 10.88* 13.42*  CALCIUM  --    < > 9.4 9.4 8.9  PROT 7.1  --   --   --   --   BILITOT 0.7  --   --   --   --   ALKPHOS 98  --   --   --   --  ALT 19  --   --   --   --   AST 18  --   --   --   --   GLUCOSE  --    < > 132* 140* 130*   < > = values in this interval not displayed.   ECHO: Study Conclusions - Left ventricle: The cavity size was normal. Wall thickness was increased in a pattern of moderate to severe LVH. Systolic function was normal. The estimated ejection fraction was in the range of 50% to 55%. Wall motion was normal; there were no regional wall motion abnormalities. Doppler parameters are consistent with abnormal left ventricular relaxation (grade 1 diastolic dysfunction). - Mitral valve: Moderately calcified annulus.  Rodderick Holtzer, [place], DO 11/03/2017, 8:34 AM PGY-1 [facility] Intern pager: [phone], text pages welcome

## 2017-11-03 NOTE — Care Management Note (Addendum)
Case Management Note  Patient Details  Name: [name] Jepsen Sr. MRN: [id number] Date of Birth: Oct 30, 1975  Subjective/Objective:  Pt presented for Chest Pain-s/p cath 10-31-17  Revealed Multivessel CAD- Plan for CABG 11-04-17. PTA from [facility]-plan will be to return post procedure.                  Action/Plan: CSW and CM following for additional needs.   Expected Discharge Date:                  Expected Discharge Plan:  Skilled Nursing Facility([facility] [phone])  In-House Referral:  Clinical Social Work  Discharge planning Services  CM Consult  Post Acute Care Choice:  NA Choice offered to:  NA  DME Arranged:  N/A DME Agency:  NA  HH Arranged:  NA HH Agency:  NA  Status of Service:  Completed, signed off  If discussed at [place] of Stay Meetings, dates discussed:  11-03-17  Additional Comments:  Bethena Roys, RN 11/03/2017, 2:59 PM

## 2017-11-03 NOTE — Progress Notes (Signed)
[place] for Heparin  Indication: chest pain/ACS  Allergies  Allergen Reactions  . Coconut Oil Anaphylaxis    Can use topically, allergic to coconut foods    Patient Measurements: Height: 6\' 2"  (188 cm) Weight: 172 lb (78 kg) IBW/kg (Calculated) : 82.2  Vital Signs: Temp: 97.7 F (36.5 C) (03/21 0533) Temp Source: Oral (03/21 0533) BP: 137/55 (03/21 0533) Pulse Rate: 68 (03/21 0533)  Labs: Recent Labs    11/01/17 0720  11/02/17 0326 11/03/17 0412 11/03/17 0819  HGB 16.0  --  14.3 14.2  --   HCT 47.6  --  42.8 42.3  --   PLT 346  --  402* 383  --   HEPARINUNFRC  --    < > 0.43 1.98* 0.33  CREATININE 9.03*  --  10.88* 13.42*  --    < > = values in this interval not displayed.   Estimated Creatinine Clearance: 8.1 mL/min (A) (by C-G formula based on SCr of 13.42 mg/dL (H)).  Assessment: 42 y.o. male with ESRD on HD, has multivessel CAD s/p cath, continues on IV heparin, awaiting poss CABG on 3/22. Heparin level was 1.98 this AM, appeared to be an error as levels previously therapeutic on heparin drip 1300 units/hr. Redrew the heparin level, = 0.33, therapeutic.  CBC remains wnl. No bleeding noted. Plan for CABG on Friday 11/04/17 per Dr. Roxan Hockey after plavix washout.  Goal of Therapy:  Heparin level 0.3-0.7 units/ml Monitor platelets by anticoagulation protocol: Yes   Plan:  Continue IV Heparin 1300 units/hr Daily heparin level and CBC  Nicole Cella, [place] Clinical Pharmacist Pager: [phone] or [phone] (330p-1030p) Main Rx [phone] 11/03/2017,1:20 PM

## 2017-11-03 NOTE — Progress Notes (Signed)
HD tx completed @ 2149 w/o problems other than a couple of low bp episodes that were asymptomatic and responded to fluid bolus, when cuff position changed bp was more appropriate, UF goal not met, blood rinsed back, VSS, report called to Barbie Haggis, RN

## 2017-11-03 NOTE — Progress Notes (Signed)
3 Days Post-Op Procedure(s) (LRB): LEFT HEART CATH AND CORONARY ANGIOGRAPHY (N/A) Subjective: No complaints this AM Had 2 episodes of angina last night - both resolved with SL NTG Now on heparin and NTG drips  Objective: Vital signs in last 24 hours: Temp:  [97.7 F (36.5 C)-98.4 F (36.9 C)] 97.7 F (36.5 C) (03/21 0533) Pulse Rate:  [68-84] 68 (03/21 0533) Cardiac Rhythm: Normal sinus rhythm (03/20 2026) BP: (123-143)/(45-105) 137/55 (03/21 0533) SpO2:  [96 %-100 %] 100 % (03/21 0533) Weight:  [172 lb (78 kg)] 172 lb (78 kg) (03/21 0533)  Hemodynamic parameters for last 24 hours:    Intake/Output from previous day: 03/20 0701 - 03/21 0700 In: 1362.5 [P.O.:1080; I.V.:282.5] Out: -  Intake/Output this shift: No intake/output data recorded.  General appearance: alert, cooperative and no distress Neurologic: intact Heart: regular rate and rhythm Lungs: clear to auscultation bilaterally  Lab Results: Recent Labs    11/02/17 0326 11/03/17 0412  WBC 9.9 8.3  HGB 14.3 14.2  HCT 42.8 42.3  PLT 402* 383   BMET:  Recent Labs    11/02/17 0326 11/03/17 0412  NA 131* 130*  K 4.4 4.8  CL 91* 87*  CO2 23 24  GLUCOSE 140* 130*  BUN 63* 90*  CREATININE 10.88* 13.42*  CALCIUM 9.4 8.9    PT/INR: No results for input(s): LABPROT, INR in the last 72 hours. ABG    Component Value Date/Time   HCO3 28.1 (H) 06/04/2017 1020   TCO2 29 06/04/2017 1020   O2SAT 85.0 06/04/2017 1020   CBG (last 3)  Recent Labs    11/02/17 1640 11/02/17 2142 11/03/17 0735  GLUCAP 233* 161* 99    Assessment/Plan: S/P Procedure(s) (LRB): LEFT HEART CATH AND CORONARY ANGIOGRAPHY (N/A) Severe 3 vessel CAD with unstable angina For CABG in AM Will only be 4 days off plavix but I don't think he'll be able to wait until next week. He is aware of higher bleeding risk but is willing to accept that All questions answered   LOS: 6 days    Melrose Nakayama 11/03/2017

## 2017-11-04 ENCOUNTER — Encounter (HOSPITAL_COMMUNITY): Payer: Self-pay | Admitting: Anesthesiology

## 2017-11-04 ENCOUNTER — Inpatient Hospital Stay (HOSPITAL_COMMUNITY): Payer: Medicaid Other | Admitting: Anesthesiology

## 2017-11-04 ENCOUNTER — Inpatient Hospital Stay (HOSPITAL_COMMUNITY): Payer: Medicaid Other

## 2017-11-04 ENCOUNTER — Inpatient Hospital Stay (HOSPITAL_COMMUNITY)
Admission: EM | Disposition: A | Discharge status: Skilled Nursing Facility | Payer: Self-pay | Source: Home / Self Care | Attending: Family Medicine

## 2017-11-04 DIAGNOSIS — Z951 Presence of aortocoronary bypass graft: Secondary | ICD-10-CM

## 2017-11-04 HISTORY — PX: TEE WITHOUT CARDIOVERSION: SHX5443

## 2017-11-04 HISTORY — PX: CORONARY ARTERY BYPASS GRAFT: SHX141

## 2017-11-04 LAB — POCT I-STAT 3, ART BLOOD GAS (G3+)
ACID-BASE DEFICIT: 2 mmol/L (ref 0.0–2.0)
ACID-BASE DEFICIT: 3 mmol/L — AB (ref 0.0–2.0)
ACID-BASE DEFICIT: 3 mmol/L — AB (ref 0.0–2.0)
ACID-BASE EXCESS: 4 mmol/L — AB (ref 0.0–2.0)
Acid-Base Excess: 3 mmol/L — ABNORMAL HIGH (ref 0.0–2.0)
BICARBONATE: 26.5 mmol/L (ref 20.0–28.0)
BICARBONATE: 22.5 mmol/L (ref 20.0–28.0)
Bicarbonate: 29.3 mmol/L — ABNORMAL HIGH (ref 20.0–28.0)
Bicarbonate: 22.1 mmol/L (ref 20.0–28.0)
Bicarbonate: 22.3 mmol/L (ref 20.0–28.0)
O2 SAT: 100 %
O2 SAT: 99 %
O2 Saturation: 100 %
O2 Saturation: 89 %
O2 Saturation: 99 %
PCO2 ART: 49.9 mmHg — AB (ref 32.0–48.0)
PH ART: 7.376 (ref 7.350–7.450)
PO2 ART: 450 mmHg — AB (ref 83.0–108.0)
PO2 ART: 525 mmHg — AB (ref 83.0–108.0)
TCO2: 31 mmol/L (ref 22–32)
TCO2: 27 mmol/L (ref 22–32)
TCO2: 23 mmol/L (ref 22–32)
TCO2: 24 mmol/L (ref 22–32)
TCO2: 24 mmol/L (ref 22–32)
pCO2 arterial: 33.2 mmHg (ref 32.0–48.0)
pCO2 arterial: 33.9 mmHg (ref 32.0–48.0)
pCO2 arterial: 39.8 mmHg (ref 32.0–48.0)
pCO2 arterial: 41.1 mmHg (ref 32.0–48.0)
pH, Arterial: 7.51 — ABNORMAL HIGH (ref 7.350–7.450)
pH, Arterial: 7.42 (ref 7.350–7.450)
pH, Arterial: 7.36 (ref 7.350–7.450)
pH, Arterial: 7.342 — ABNORMAL LOW (ref 7.350–7.450)
pO2, Arterial: 54 mmHg — ABNORMAL LOW (ref 83.0–108.0)
pO2, Arterial: 138 mmHg — ABNORMAL HIGH (ref 83.0–108.0)
pO2, Arterial: 148 mmHg — ABNORMAL HIGH (ref 83.0–108.0)

## 2017-11-04 LAB — CBC
HCT: 42.5 % (ref 39.0–52.0)
HEMATOCRIT: 34.6 % — AB (ref 39.0–52.0)
HEMATOCRIT: 35.7 % — AB (ref 39.0–52.0)
HEMOGLOBIN: 14.3 g/dL (ref 13.0–17.0)
Hemoglobin: 11.5 g/dL — ABNORMAL LOW (ref 13.0–17.0)
Hemoglobin: 11.7 g/dL — ABNORMAL LOW (ref 13.0–17.0)
MCH: 29.4 pg (ref 26.0–34.0)
MCH: 29.3 pg (ref 26.0–34.0)
MCH: 29.1 pg (ref 26.0–34.0)
MCHC: 33.6 g/dL (ref 30.0–36.0)
MCHC: 33.2 g/dL (ref 30.0–36.0)
MCHC: 32.8 g/dL (ref 30.0–36.0)
MCV: 87.4 fL (ref 78.0–100.0)
MCV: 88 fL (ref 78.0–100.0)
MCV: 88.8 fL (ref 78.0–100.0)
PLATELETS: 237 10*3/uL (ref 150–400)
PLATELETS: 263 10*3/uL (ref 150–400)
Platelets: 356 10*3/uL (ref 150–400)
RBC: 4.86 MIL/uL (ref 4.22–5.81)
RBC: 3.93 MIL/uL — AB (ref 4.22–5.81)
RBC: 4.02 MIL/uL — ABNORMAL LOW (ref 4.22–5.81)
RDW: 15.4 % (ref 11.5–15.5)
RDW: 15.7 % — ABNORMAL HIGH (ref 11.5–15.5)
RDW: 15.9 % — AB (ref 11.5–15.5)
WBC: 8.1 10*3/uL (ref 4.0–10.5)
WBC: 18.6 10*3/uL — ABNORMAL HIGH (ref 4.0–10.5)
WBC: 17 10*3/uL — ABNORMAL HIGH (ref 4.0–10.5)

## 2017-11-04 LAB — POCT I-STAT, CHEM 8
BUN: 42 mg/dL — AB (ref 6–20)
BUN: 39 mg/dL — AB (ref 6–20)
BUN: 40 mg/dL — AB (ref 6–20)
BUN: 36 mg/dL — AB (ref 6–20)
BUN: 39 mg/dL — AB (ref 6–20)
BUN: 39 mg/dL — ABNORMAL HIGH (ref 6–20)
BUN: 42 mg/dL — AB (ref 6–20)
CALCIUM ION: 1.07 mmol/L — AB (ref 1.15–1.40)
CALCIUM ION: 1.02 mmol/L — AB (ref 1.15–1.40)
CALCIUM ION: 0.97 mmol/L — AB (ref 1.15–1.40)
CALCIUM ION: 1.08 mmol/L — AB (ref 1.15–1.40)
CHLORIDE: 100 mmol/L — AB (ref 101–111)
CHLORIDE: 100 mmol/L — AB (ref 101–111)
CHLORIDE: 97 mmol/L — AB (ref 101–111)
CREATININE: 9.3 mg/dL — AB (ref 0.61–1.24)
CREATININE: 8.1 mg/dL — AB (ref 0.61–1.24)
CREATININE: 8.6 mg/dL — AB (ref 0.61–1.24)
CREATININE: 8.7 mg/dL — AB (ref 0.61–1.24)
Calcium, Ion: 1.04 mmol/L — ABNORMAL LOW (ref 1.15–1.40)
Calcium, Ion: 0.88 mmol/L — CL (ref 1.15–1.40)
Calcium, Ion: 0.99 mmol/L — ABNORMAL LOW (ref 1.15–1.40)
Chloride: 96 mmol/L — ABNORMAL LOW (ref 101–111)
Chloride: 100 mmol/L — ABNORMAL LOW (ref 101–111)
Chloride: 101 mmol/L (ref 101–111)
Chloride: 104 mmol/L (ref 101–111)
Creatinine, Ser: 9.7 mg/dL — ABNORMAL HIGH (ref 0.61–1.24)
Creatinine, Ser: 9.4 mg/dL — ABNORMAL HIGH (ref 0.61–1.24)
Creatinine, Ser: 9.2 mg/dL — ABNORMAL HIGH (ref 0.61–1.24)
GLUCOSE: 124 mg/dL — AB (ref 65–99)
GLUCOSE: 140 mg/dL — AB (ref 65–99)
Glucose, Bld: 158 mg/dL — ABNORMAL HIGH (ref 65–99)
Glucose, Bld: 116 mg/dL — ABNORMAL HIGH (ref 65–99)
Glucose, Bld: 97 mg/dL (ref 65–99)
Glucose, Bld: 81 mg/dL (ref 65–99)
Glucose, Bld: 100 mg/dL — ABNORMAL HIGH (ref 65–99)
HCT: 31 % — ABNORMAL LOW (ref 39.0–52.0)
HCT: 36 % — ABNORMAL LOW (ref 39.0–52.0)
HEMATOCRIT: 42 % (ref 39.0–52.0)
HEMATOCRIT: 35 % — AB (ref 39.0–52.0)
HEMATOCRIT: 38 % — AB (ref 39.0–52.0)
HEMATOCRIT: 32 % — AB (ref 39.0–52.0)
HEMATOCRIT: 33 % — AB (ref 39.0–52.0)
HEMOGLOBIN: 10.9 g/dL — AB (ref 13.0–17.0)
HEMOGLOBIN: 11.2 g/dL — AB (ref 13.0–17.0)
HEMOGLOBIN: 10.5 g/dL — AB (ref 13.0–17.0)
HEMOGLOBIN: 12.2 g/dL — AB (ref 13.0–17.0)
Hemoglobin: 14.3 g/dL (ref 13.0–17.0)
Hemoglobin: 11.9 g/dL — ABNORMAL LOW (ref 13.0–17.0)
Hemoglobin: 12.9 g/dL — ABNORMAL LOW (ref 13.0–17.0)
POTASSIUM: 4.1 mmol/L (ref 3.5–5.1)
POTASSIUM: 4.2 mmol/L (ref 3.5–5.1)
POTASSIUM: 4.3 mmol/L (ref 3.5–5.1)
POTASSIUM: 4.7 mmol/L (ref 3.5–5.1)
Potassium: 4.2 mmol/L (ref 3.5–5.1)
Potassium: 5.1 mmol/L (ref 3.5–5.1)
Potassium: 4.9 mmol/L (ref 3.5–5.1)
SODIUM: 135 mmol/L (ref 135–145)
SODIUM: 135 mmol/L (ref 135–145)
SODIUM: 136 mmol/L (ref 135–145)
SODIUM: 139 mmol/L (ref 135–145)
Sodium: 138 mmol/L (ref 135–145)
Sodium: 137 mmol/L (ref 135–145)
Sodium: 138 mmol/L (ref 135–145)
TCO2: 30 mmol/L (ref 22–32)
TCO2: 25 mmol/L (ref 22–32)
TCO2: 26 mmol/L (ref 22–32)
TCO2: 31 mmol/L (ref 22–32)
TCO2: 29 mmol/L (ref 22–32)
TCO2: 26 mmol/L (ref 22–32)
TCO2: 23 mmol/L (ref 22–32)

## 2017-11-04 LAB — CREATININE, SERUM
Creatinine, Ser: 9.33 mg/dL — ABNORMAL HIGH (ref 0.61–1.24)
GFR calc Af Amer: 7 mL/min — ABNORMAL LOW (ref >60)
GFR calc non Af Amer: 6 mL/min — ABNORMAL LOW (ref >60)

## 2017-11-04 LAB — POCT I-STAT 4, (NA,K, GLUC, HGB,HCT)
GLUCOSE: 157 mg/dL — AB (ref 65–99)
HEMATOCRIT: 36 % — AB (ref 39.0–52.0)
HEMOGLOBIN: 12.2 g/dL — AB (ref 13.0–17.0)
Potassium: 4.4 mmol/L (ref 3.5–5.1)
SODIUM: 139 mmol/L (ref 135–145)

## 2017-11-04 LAB — COMPREHENSIVE METABOLIC PANEL
ALK PHOS: 89 U/L (ref 38–126)
ALT: 39 U/L (ref 17–63)
AST: 35 U/L (ref 15–41)
Albumin: 3.8 g/dL (ref 3.5–5.0)
Anion gap: 15 (ref 5–15)
BILIRUBIN TOTAL: 1 mg/dL (ref 0.3–1.2)
BUN: 33 mg/dL — ABNORMAL HIGH (ref 6–20)
CALCIUM: 8.6 mg/dL — AB (ref 8.9–10.3)
CO2: 26 mmol/L (ref 22–32)
CREATININE: 7.85 mg/dL — AB (ref 0.61–1.24)
Chloride: 91 mmol/L — ABNORMAL LOW (ref 101–111)
GFR calc non Af Amer: 8 mL/min — ABNORMAL LOW (ref >60)
GFR, EST AFRICAN AMERICAN: 9 mL/min — AB (ref >60)
Glucose, Bld: 317 mg/dL — ABNORMAL HIGH (ref 65–99)
Potassium: 3.9 mmol/L (ref 3.5–5.1)
Sodium: 132 mmol/L — ABNORMAL LOW (ref 135–145)
Total Protein: 8.1 g/dL (ref 6.5–8.1)

## 2017-11-04 LAB — TYPE AND SCREEN
ABO/RH(D): B POS
Antibody Screen: NEGATIVE

## 2017-11-04 LAB — RENAL FUNCTION PANEL
ANION GAP: 16 — AB (ref 5–15)
Albumin: 3.5 g/dL (ref 3.5–5.0)
BUN: 44 mg/dL — ABNORMAL HIGH (ref 6–20)
CHLORIDE: 91 mmol/L — AB (ref 101–111)
CO2: 25 mmol/L (ref 22–32)
Calcium: 8.8 mg/dL — ABNORMAL LOW (ref 8.9–10.3)
Creatinine, Ser: 8.83 mg/dL — ABNORMAL HIGH (ref 0.61–1.24)
GFR calc non Af Amer: 7 mL/min — ABNORMAL LOW (ref >60)
GFR, EST AFRICAN AMERICAN: 8 mL/min — AB (ref >60)
Glucose, Bld: 256 mg/dL — ABNORMAL HIGH (ref 65–99)
Phosphorus: 4.5 mg/dL (ref 2.5–4.6)
Potassium: 4.3 mmol/L (ref 3.5–5.1)
Sodium: 132 mmol/L — ABNORMAL LOW (ref 135–145)

## 2017-11-04 LAB — SURGICAL PCR SCREEN
MRSA, PCR: POSITIVE — AB
Staphylococcus aureus: POSITIVE — AB

## 2017-11-04 LAB — GLUCOSE, CAPILLARY
GLUCOSE-CAPILLARY: 117 mg/dL — AB (ref 65–99)
Glucose-Capillary: 154 mg/dL — ABNORMAL HIGH (ref 65–99)
Glucose-Capillary: 193 mg/dL — ABNORMAL HIGH (ref 65–99)
Glucose-Capillary: 347 mg/dL — ABNORMAL HIGH (ref 65–99)

## 2017-11-04 LAB — BLOOD GAS, ARTERIAL
Acid-Base Excess: 4.4 mmol/L — ABNORMAL HIGH (ref 0.0–2.0)
BICARBONATE: 28 mmol/L (ref 20.0–28.0)
Drawn by: 52078
O2 Saturation: 92.7 %
PCO2 ART: 38.3 mmHg (ref 32.0–48.0)
PH ART: 7.477 — AB (ref 7.350–7.450)
Patient temperature: 98.6
pO2, Arterial: 66.6 mmHg — ABNORMAL LOW (ref 83.0–108.0)

## 2017-11-04 LAB — APTT
aPTT: 39 seconds — ABNORMAL HIGH (ref 24–36)
aPTT: 77 seconds — ABNORMAL HIGH (ref 24–36)

## 2017-11-04 LAB — PLATELET COUNT: Platelets: 285 10*3/uL (ref 150–400)

## 2017-11-04 LAB — HEPARIN LEVEL (UNFRACTIONATED): Heparin Unfractionated: 0.32 [IU]/mL (ref 0.30–0.70)

## 2017-11-04 LAB — PROTIME-INR
INR: 1.25
Prothrombin Time: 15.6 seconds — ABNORMAL HIGH (ref 11.4–15.2)

## 2017-11-04 LAB — HEMOGLOBIN AND HEMATOCRIT, BLOOD
HEMATOCRIT: 33.2 % — AB (ref 39.0–52.0)
HEMOGLOBIN: 11.1 g/dL — AB (ref 13.0–17.0)

## 2017-11-04 LAB — MAGNESIUM: Magnesium: 2.3 mg/dL (ref 1.7–2.4)

## 2017-11-04 SURGERY — CORONARY ARTERY BYPASS GRAFTING (CABG)
Anesthesia: General | Site: Chest

## 2017-11-04 MED ORDER — ONDANSETRON HCL 4 MG/2ML IJ SOLN
4.0000 mg | Freq: Four times a day (QID) | INTRAMUSCULAR | Status: DC | PRN
  Administered 2017-11-05: 4 mg via INTRAVENOUS
  Filled 2017-11-04: qty 2

## 2017-11-04 MED ORDER — CEFAZOLIN SODIUM-DEXTROSE 2-4 GM/100ML-% IV SOLN
2.0000 g | Freq: Three times a day (TID) | INTRAVENOUS | Status: DC
  Filled 2017-11-04: qty 100

## 2017-11-04 MED ORDER — SODIUM CHLORIDE 0.9 % IV SOLN
INTRAVENOUS | Status: DC | PRN
  Administered 2017-11-04: 08:00:00 via INTRAVENOUS

## 2017-11-04 MED ORDER — MIDAZOLAM HCL 2 MG/2ML IJ SOLN
INTRAMUSCULAR | Status: AC
  Filled 2017-11-04: qty 2

## 2017-11-04 MED ORDER — METOPROLOL TARTRATE 25 MG/10 ML ORAL SUSPENSION: 12.5000 mg | Freq: Two times a day (BID) | ORAL | Status: DC

## 2017-11-04 MED ORDER — ESMOLOL HCL 100 MG/10ML IV SOLN
INTRAVENOUS | Status: DC | PRN
  Administered 2017-11-04: 20 mg via INTRAVENOUS

## 2017-11-04 MED ORDER — PHENYLEPHRINE 40 MCG/ML (10ML) SYRINGE FOR IV PUSH (FOR BLOOD PRESSURE SUPPORT)
PREFILLED_SYRINGE | INTRAVENOUS | Status: AC
  Filled 2017-11-04: qty 10

## 2017-11-04 MED ORDER — EPHEDRINE 5 MG/ML INJ
INTRAVENOUS | Status: AC
  Filled 2017-11-04: qty 10

## 2017-11-04 MED ORDER — VANCOMYCIN HCL IN DEXTROSE 1-5 GM/200ML-% IV SOLN
1000.0000 mg | Freq: Once | INTRAVENOUS | Status: AC
  Administered 2017-11-04: 1000 mg via INTRAVENOUS
  Filled 2017-11-04: qty 200

## 2017-11-04 MED ORDER — CEFAZOLIN SODIUM-DEXTROSE 2-4 GM/100ML-% IV SOLN
2.0000 g | Freq: Two times a day (BID) | INTRAVENOUS | Status: DC
  Administered 2017-11-04: 2 g via INTRAVENOUS
  Filled 2017-11-04 (×2): qty 100

## 2017-11-04 MED ORDER — PANTOPRAZOLE SODIUM 40 MG PO TBEC
40.0000 mg | DELAYED_RELEASE_TABLET | Freq: Every day | ORAL | Status: DC
  Administered 2017-11-06 – 2017-11-09 (×4): 40 mg via ORAL
  Filled 2017-11-04 (×4): qty 1

## 2017-11-04 MED ORDER — SODIUM CHLORIDE 0.9 % IV SOLN: 250.0000 mL | INTRAVENOUS | Status: DC

## 2017-11-04 MED ORDER — MIDAZOLAM HCL 2 MG/2ML IJ SOLN
INTRAMUSCULAR | Status: AC
  Administered 2017-11-04: 2 mg
  Filled 2017-11-04: qty 2

## 2017-11-04 MED ORDER — NITROGLYCERIN IN D5W 200-5 MCG/ML-% IV SOLN
0.0000 ug/min | INTRAVENOUS | Status: DC
  Administered 2017-11-04: 20 ug/min via INTRAVENOUS
  Administered 2017-11-05: 80 ug/min via INTRAVENOUS
  Filled 2017-11-04: qty 250

## 2017-11-04 MED ORDER — FENTANYL CITRATE (PF) 250 MCG/5ML IJ SOLN
INTRAMUSCULAR | Status: AC
  Filled 2017-11-04: qty 25

## 2017-11-04 MED ORDER — ETOMIDATE 2 MG/ML IV SOLN
INTRAVENOUS | Status: AC
  Filled 2017-11-04: qty 10

## 2017-11-04 MED ORDER — PROTAMINE SULFATE 10 MG/ML IV SOLN
INTRAVENOUS | Status: AC
  Filled 2017-11-04: qty 25

## 2017-11-04 MED ORDER — ACETAMINOPHEN 650 MG RE SUPP: 650.0000 mg | Freq: Once | RECTAL | Status: AC

## 2017-11-04 MED ORDER — ASPIRIN 81 MG PO CHEW: 324.0000 mg | CHEWABLE_TABLET | Freq: Every day | ORAL | Status: DC

## 2017-11-04 MED ORDER — HEPARIN SODIUM (PORCINE) 1000 UNIT/ML IJ SOLN
INTRAMUSCULAR | Status: AC
  Filled 2017-11-04: qty 1

## 2017-11-04 MED ORDER — PROTAMINE SULFATE 10 MG/ML IV SOLN
INTRAVENOUS | Status: AC
  Filled 2017-11-04: qty 5

## 2017-11-04 MED ORDER — ESMOLOL HCL 100 MG/10ML IV SOLN
INTRAVENOUS | Status: AC
  Filled 2017-11-04: qty 10

## 2017-11-04 MED ORDER — ROCURONIUM BROMIDE 10 MG/ML (PF) SYRINGE
PREFILLED_SYRINGE | INTRAVENOUS | Status: AC
  Filled 2017-11-04: qty 5

## 2017-11-04 MED ORDER — LANTHANUM CARBONATE 500 MG PO CHEW
1000.0000 mg | CHEWABLE_TABLET | Freq: Three times a day (TID) | ORAL | Status: DC
  Administered 2017-11-06 – 2017-11-07 (×4): 1000 mg via ORAL
  Filled 2017-11-04 (×13): qty 2

## 2017-11-04 MED ORDER — HEMOSTATIC AGENTS (NO CHARGE) OPTIME
TOPICAL | Status: DC | PRN
  Administered 2017-11-04: 3 via TOPICAL

## 2017-11-04 MED ORDER — FENTANYL CITRATE (PF) 250 MCG/5ML IJ SOLN
INTRAMUSCULAR | Status: AC
  Filled 2017-11-04: qty 10

## 2017-11-04 MED ORDER — FAMOTIDINE IN NACL 20-0.9 MG/50ML-% IV SOLN
20.0000 mg | Freq: Two times a day (BID) | INTRAVENOUS | Status: AC
  Administered 2017-11-04 – 2017-11-05 (×2): 20 mg via INTRAVENOUS
  Filled 2017-11-04: qty 50

## 2017-11-04 MED ORDER — SODIUM CHLORIDE 0.45 % IV SOLN
INTRAVENOUS | Status: DC | PRN
  Administered 2017-11-04: 15:00:00 via INTRAVENOUS

## 2017-11-04 MED ORDER — PROPOFOL 10 MG/ML IV BOLUS
INTRAVENOUS | Status: AC
  Filled 2017-11-04: qty 20

## 2017-11-04 MED ORDER — PHENYLEPHRINE HCL 10 MG/ML IJ SOLN
INTRAMUSCULAR | Status: AC
  Filled 2017-11-04: qty 1

## 2017-11-04 MED ORDER — MIDAZOLAM HCL 2 MG/2ML IJ SOLN: 2.0000 mg | INTRAMUSCULAR | Status: DC | PRN

## 2017-11-04 MED ORDER — ARTIFICIAL TEARS OPHTHALMIC OINT
TOPICAL_OINTMENT | OPHTHALMIC | Status: DC | PRN
  Administered 2017-11-04: 1 via OPHTHALMIC

## 2017-11-04 MED ORDER — PHENYLEPHRINE HCL 10 MG/ML IJ SOLN
INTRAMUSCULAR | Status: DC | PRN
  Administered 2017-11-04: 25 ug/min via INTRAVENOUS

## 2017-11-04 MED ORDER — LIDOCAINE 2% (20 MG/ML) 5 ML SYRINGE
INTRAMUSCULAR | Status: DC | PRN
  Administered 2017-11-04: 40 mg via INTRAVENOUS
  Administered 2017-11-04: 60 mg via INTRAVENOUS

## 2017-11-04 MED ORDER — ALBUMIN HUMAN 5 % IV SOLN
INTRAVENOUS | Status: DC | PRN
  Administered 2017-11-04: 14:00:00 via INTRAVENOUS

## 2017-11-04 MED ORDER — ACETAMINOPHEN 160 MG/5ML PO SOLN: 1000.0000 mg | Freq: Four times a day (QID) | ORAL | Status: DC

## 2017-11-04 MED ORDER — LACTATED RINGERS IV SOLN: INTRAVENOUS | Status: DC

## 2017-11-04 MED ORDER — DOCUSATE SODIUM 100 MG PO CAPS
200.0000 mg | ORAL_CAPSULE | Freq: Every day | ORAL | Status: DC
  Administered 2017-11-05 – 2017-11-09 (×4): 200 mg via ORAL
  Filled 2017-11-04 (×5): qty 2

## 2017-11-04 MED ORDER — MAGNESIUM SULFATE 4 GM/100ML IV SOLN: 4.0000 g | Freq: Once | INTRAVENOUS | Status: DC

## 2017-11-04 MED ORDER — SODIUM CHLORIDE 0.9 % IV SOLN: INTRAVENOUS | Status: DC

## 2017-11-04 MED ORDER — POTASSIUM CHLORIDE 10 MEQ/50ML IV SOLN: 10.0000 meq | INTRAVENOUS | Status: DC

## 2017-11-04 MED ORDER — METOPROLOL TARTRATE 12.5 MG HALF TABLET
12.5000 mg | ORAL_TABLET | Freq: Two times a day (BID) | ORAL | Status: DC
  Administered 2017-11-05 – 2017-11-06 (×4): 12.5 mg via ORAL
  Filled 2017-11-04 (×4): qty 1

## 2017-11-04 MED ORDER — PHENYLEPHRINE 40 MCG/ML (10ML) SYRINGE FOR IV PUSH (FOR BLOOD PRESSURE SUPPORT)
PREFILLED_SYRINGE | INTRAVENOUS | Status: DC | PRN
  Administered 2017-11-04: 40 ug via INTRAVENOUS
  Administered 2017-11-04 (×5): 80 ug via INTRAVENOUS
  Administered 2017-11-04: 40 ug via INTRAVENOUS
  Administered 2017-11-04 (×5): 80 ug via INTRAVENOUS

## 2017-11-04 MED ORDER — SODIUM CHLORIDE 0.9% FLUSH
3.0000 mL | Freq: Two times a day (BID) | INTRAVENOUS | Status: DC
  Administered 2017-11-05 – 2017-11-07 (×4): 3 mL via INTRAVENOUS

## 2017-11-04 MED ORDER — LAMOTRIGINE 25 MG PO TABS
150.0000 mg | ORAL_TABLET | Freq: Every day | ORAL | Status: DC
  Administered 2017-11-05 – 2017-11-08 (×4): 150 mg via ORAL
  Filled 2017-11-04: qty 6
  Filled 2017-11-04 (×3): qty 1

## 2017-11-04 MED ORDER — MIDAZOLAM HCL 10 MG/2ML IJ SOLN
INTRAMUSCULAR | Status: AC
  Filled 2017-11-04: qty 2

## 2017-11-04 MED ORDER — DEXMEDETOMIDINE HCL IN NACL 400 MCG/100ML IV SOLN: 0.0000 ug/kg/h | INTRAVENOUS | Status: DC

## 2017-11-04 MED ORDER — ROCURONIUM BROMIDE 10 MG/ML (PF) SYRINGE
PREFILLED_SYRINGE | INTRAVENOUS | Status: DC | PRN
  Administered 2017-11-04: 50 mg via INTRAVENOUS
  Administered 2017-11-04: 40 mg via INTRAVENOUS
  Administered 2017-11-04: 60 mg via INTRAVENOUS
  Administered 2017-11-04: 20 mg via INTRAVENOUS

## 2017-11-04 MED ORDER — MORPHINE SULFATE (PF) 2 MG/ML IV SOLN
2.0000 mg | INTRAVENOUS | Status: DC | PRN
  Administered 2017-11-04 (×2): 2 mg via INTRAVENOUS
  Administered 2017-11-05: 4 mg via INTRAVENOUS
  Administered 2017-11-05 (×4): 2 mg via INTRAVENOUS
  Filled 2017-11-04 (×2): qty 1
  Filled 2017-11-04: qty 2
  Filled 2017-11-04 (×3): qty 1

## 2017-11-04 MED ORDER — HEPARIN SODIUM (PORCINE) 1000 UNIT/ML IJ SOLN
INTRAMUSCULAR | Status: DC | PRN
  Administered 2017-11-04: 24000 [IU] via INTRAVENOUS
  Administered 2017-11-04: 2000 [IU] via INTRAVENOUS

## 2017-11-04 MED ORDER — METOPROLOL TARTRATE 5 MG/5ML IV SOLN
2.5000 mg | INTRAVENOUS | Status: DC | PRN
  Administered 2017-11-04 – 2017-11-05 (×3): 5 mg via INTRAVENOUS
  Filled 2017-11-04 (×3): qty 5

## 2017-11-04 MED ORDER — BISACODYL 10 MG RE SUPP
10.0000 mg | Freq: Every day | RECTAL | Status: DC
Start: 2017-11-05 — End: 2017-11-09

## 2017-11-04 MED ORDER — INSULIN REGULAR BOLUS VIA INFUSION
0.0000 [IU] | Freq: Three times a day (TID) | INTRAVENOUS | Status: DC
  Filled 2017-11-04: qty 10

## 2017-11-04 MED ORDER — ASPIRIN EC 325 MG PO TBEC
325.0000 mg | DELAYED_RELEASE_TABLET | Freq: Every day | ORAL | Status: DC
  Administered 2017-11-05 – 2017-11-09 (×5): 325 mg via ORAL
  Filled 2017-11-04 (×5): qty 1

## 2017-11-04 MED ORDER — 0.9 % SODIUM CHLORIDE (POUR BTL) OPTIME
TOPICAL | Status: DC | PRN
  Administered 2017-11-04: 5000 mL

## 2017-11-04 MED ORDER — ALBUMIN HUMAN 5 % IV SOLN: 250.0000 mL | INTRAVENOUS | Status: AC | PRN

## 2017-11-04 MED ORDER — CHLORHEXIDINE GLUCONATE 0.12 % MT SOLN
15.0000 mL | OROMUCOSAL | Status: AC
  Administered 2017-11-04: 15 mL via OROMUCOSAL

## 2017-11-04 MED ORDER — LIDOCAINE HCL (CARDIAC) 20 MG/ML IV SOLN
INTRAVENOUS | Status: AC
  Filled 2017-11-04: qty 5

## 2017-11-04 MED ORDER — BISACODYL 5 MG PO TBEC
10.0000 mg | DELAYED_RELEASE_TABLET | Freq: Every day | ORAL | Status: DC
  Filled 2017-11-04 (×3): qty 2

## 2017-11-04 MED ORDER — SODIUM CHLORIDE 0.9% FLUSH: 3.0000 mL | INTRAVENOUS | Status: DC | PRN

## 2017-11-04 MED ORDER — HEMOSTATIC AGENTS (NO CHARGE) OPTIME
TOPICAL | Status: DC | PRN
  Administered 2017-11-04: 1 via TOPICAL

## 2017-11-04 MED ORDER — SODIUM CHLORIDE 0.9 % IV SOLN
INTRAVENOUS | Status: DC | PRN
  Administered 2017-11-04 (×2): via INTRAVENOUS

## 2017-11-04 MED ORDER — FENTANYL CITRATE (PF) 250 MCG/5ML IJ SOLN
INTRAMUSCULAR | Status: DC | PRN
  Administered 2017-11-04: 250 ug via INTRAVENOUS
  Administered 2017-11-04 (×2): 100 ug via INTRAVENOUS
  Administered 2017-11-04: 125 ug via INTRAVENOUS
  Administered 2017-11-04: 250 ug via INTRAVENOUS
  Administered 2017-11-04: 50 ug via INTRAVENOUS
  Administered 2017-11-04 (×2): 100 ug via INTRAVENOUS
  Administered 2017-11-04: 25 ug via INTRAVENOUS
  Administered 2017-11-04 (×2): 250 ug via INTRAVENOUS
  Administered 2017-11-04: 25 ug via INTRAVENOUS
  Administered 2017-11-04: 100 ug via INTRAVENOUS
  Administered 2017-11-04: 25 ug via INTRAVENOUS

## 2017-11-04 MED ORDER — PROTAMINE SULFATE 10 MG/ML IV SOLN
INTRAVENOUS | Status: DC | PRN
  Administered 2017-11-04: 260 mg via INTRAVENOUS

## 2017-11-04 MED ORDER — ACETAMINOPHEN 160 MG/5ML PO SOLN
650.0000 mg | Freq: Once | ORAL | Status: AC
  Administered 2017-11-04: 650 mg

## 2017-11-04 MED ORDER — MORPHINE SULFATE (PF) 2 MG/ML IV SOLN
1.0000 mg | INTRAVENOUS | Status: DC | PRN
  Administered 2017-11-04: 2 mg via INTRAVENOUS
  Filled 2017-11-04 (×2): qty 1

## 2017-11-04 MED ORDER — SODIUM CHLORIDE 0.9 % IV SOLN: 0.0000 ug/min | INTRAVENOUS | Status: DC

## 2017-11-04 MED ORDER — OXYCODONE HCL 5 MG PO TABS
5.0000 mg | ORAL_TABLET | ORAL | Status: DC | PRN
  Administered 2017-11-04 – 2017-11-08 (×10): 10 mg via ORAL
  Filled 2017-11-04 (×12): qty 2

## 2017-11-04 MED ORDER — RAMELTEON 8 MG PO TABS
8.0000 mg | ORAL_TABLET | Freq: Every day | ORAL | Status: DC
  Administered 2017-11-05 – 2017-11-08 (×4): 8 mg via ORAL
  Filled 2017-11-04 (×4): qty 1

## 2017-11-04 MED ORDER — TRAMADOL HCL 50 MG PO TABS: 50.0000 mg | ORAL_TABLET | ORAL | Status: DC | PRN

## 2017-11-04 MED ORDER — ATORVASTATIN CALCIUM 80 MG PO TABS
80.0000 mg | ORAL_TABLET | Freq: Every day | ORAL | Status: DC
  Administered 2017-11-05 – 2017-11-08 (×4): 80 mg via ORAL
  Filled 2017-11-04 (×4): qty 1

## 2017-11-04 MED ORDER — LACTATED RINGERS IV SOLN: 500.0000 mL | Freq: Once | INTRAVENOUS | Status: DC | PRN

## 2017-11-04 MED ORDER — ACETAMINOPHEN 500 MG PO TABS
1000.0000 mg | ORAL_TABLET | Freq: Four times a day (QID) | ORAL | Status: DC
  Administered 2017-11-04 – 2017-11-09 (×14): 1000 mg via ORAL
  Filled 2017-11-04 (×15): qty 2

## 2017-11-04 MED ORDER — ETOMIDATE 2 MG/ML IV SOLN
INTRAVENOUS | Status: DC | PRN
  Administered 2017-11-04: 10 mg via INTRAVENOUS

## 2017-11-04 MED ORDER — MIDAZOLAM HCL 5 MG/5ML IJ SOLN
INTRAMUSCULAR | Status: DC | PRN
  Administered 2017-11-04 (×2): 2 mg via INTRAVENOUS
  Administered 2017-11-04: 3 mg via INTRAVENOUS
  Administered 2017-11-04: 1 mg via INTRAVENOUS
  Administered 2017-11-04 (×2): 2 mg via INTRAVENOUS
  Administered 2017-11-04 (×2): 1 mg via INTRAVENOUS
  Administered 2017-11-04: 2 mg via INTRAVENOUS

## 2017-11-04 SURGICAL SUPPLY — 98 items
BAG DECANTER FOR FLEXI CONT (MISCELLANEOUS) ×4 IMPLANT
BANDAGE ACE 4X5 VEL STRL LF (GAUZE/BANDAGES/DRESSINGS) ×4 IMPLANT
BANDAGE ACE 6X5 VEL STRL LF (GAUZE/BANDAGES/DRESSINGS) ×4 IMPLANT
BANDAGE ELASTIC 4 VELCRO ST LF (GAUZE/BANDAGES/DRESSINGS) ×2 IMPLANT
BANDAGE ELASTIC 6 VELCRO ST LF (GAUZE/BANDAGES/DRESSINGS) ×2 IMPLANT
BASKET HEART  (ORDER IN 25'S) (MISCELLANEOUS) ×1
BASKET HEART (ORDER IN 25'S) (MISCELLANEOUS) ×1
BASKET HEART (ORDER IN 25S) (MISCELLANEOUS) ×2 IMPLANT
BLADE STERNUM SYSTEM 6 (BLADE) ×4 IMPLANT
BLADE SURG 11 STRL SS (BLADE) ×2 IMPLANT
BLADE SURG ROTATE 9660 (MISCELLANEOUS) ×2 IMPLANT
BNDG GAUZE ELAST 4 BULKY (GAUZE/BANDAGES/DRESSINGS) ×4 IMPLANT
CANISTER SUCT 3000ML PPV (MISCELLANEOUS) ×4 IMPLANT
CANNULA EZ GLIDE AORTIC 21FR (CANNULA) ×4 IMPLANT
CATH CPB KIT HENDRICKSON (MISCELLANEOUS) ×4 IMPLANT
CATH ROBINSON RED A/P 18FR (CATHETERS) ×4 IMPLANT
CATH THORACIC 36FR (CATHETERS) ×4 IMPLANT
CATH THORACIC 36FR RT ANG (CATHETERS) ×4 IMPLANT
CLIP VESOCCLUDE MED 24/CT (CLIP) IMPLANT
CLIP VESOCCLUDE SM WIDE 24/CT (CLIP) ×10 IMPLANT
CRADLE DONUT ADULT HEAD (MISCELLANEOUS) ×4 IMPLANT
DRAPE CARDIOVASCULAR INCISE (DRAPES) ×4
DRAPE SLUSH/WARMER DISC (DRAPES) ×4 IMPLANT
DRAPE SRG 135X102X78XABS (DRAPES) ×2 IMPLANT
DRSG COVADERM 4X14 (GAUZE/BANDAGES/DRESSINGS) ×4 IMPLANT
ELECT REM PT RETURN 9FT ADLT (ELECTROSURGICAL) ×8
ELECTRODE REM PT RTRN 9FT ADLT (ELECTROSURGICAL) ×4 IMPLANT
FELT TEFLON 1X6 (MISCELLANEOUS) ×8 IMPLANT
GAUZE SPONGE 4X4 12PLY STRL (GAUZE/BANDAGES/DRESSINGS) ×8 IMPLANT
GAUZE SPONGE 4X4 12PLY STRL LF (GAUZE/BANDAGES/DRESSINGS) ×4 IMPLANT
GLOVE BIO SURGEON STRL SZ 6.5 (GLOVE) ×5 IMPLANT
GLOVE BIO SURGEONS STRL SZ 6.5 (GLOVE) ×5
GLOVE BIOGEL PI IND STRL 6 (GLOVE) IMPLANT
GLOVE BIOGEL PI INDICATOR 6 (GLOVE) ×2
GLOVE SURG SIGNA 7.5 PF LTX (GLOVE) ×12 IMPLANT
GLOVE SURG SS PI 6.0 STRL IVOR (GLOVE) ×4 IMPLANT
GOWN STRL REUS W/ TWL LRG LVL3 (GOWN DISPOSABLE) ×8 IMPLANT
GOWN STRL REUS W/ TWL XL LVL3 (GOWN DISPOSABLE) ×4 IMPLANT
GOWN STRL REUS W/TWL LRG LVL3 (GOWN DISPOSABLE) ×32
GOWN STRL REUS W/TWL XL LVL3 (GOWN DISPOSABLE) ×8
HEMOSTAT POWDER SURGIFOAM 1G (HEMOSTASIS) ×14 IMPLANT
HEMOSTAT SURGICEL 2X14 (HEMOSTASIS) ×4 IMPLANT
INSERT FOGARTY XLG (MISCELLANEOUS) IMPLANT
KIT BASIN OR (CUSTOM PROCEDURE TRAY) ×4 IMPLANT
KIT ROOM TURNOVER OR (KITS) ×4 IMPLANT
KIT SUCTION CATH 14FR (SUCTIONS) ×8 IMPLANT
KIT VASOVIEW HEMOPRO VH 3000 (KITS) ×4 IMPLANT
LOOP VESSEL MAXI BLUE (MISCELLANEOUS) ×2 IMPLANT
MARKER GRAFT CORONARY BYPASS (MISCELLANEOUS) ×12 IMPLANT
NS IRRIG 1000ML POUR BTL (IV SOLUTION) ×20 IMPLANT
PACK E OPEN HEART (SUTURE) ×4 IMPLANT
PACK OPEN HEART (CUSTOM PROCEDURE TRAY) ×4 IMPLANT
PAD ARMBOARD 7.5X6 YLW CONV (MISCELLANEOUS) ×8 IMPLANT
PAD ELECT DEFIB RADIOL ZOLL (MISCELLANEOUS) ×4 IMPLANT
PENCIL BUTTON HOLSTER BLD 10FT (ELECTRODE) ×4 IMPLANT
PUNCH AORTIC ROT 4.0MM RCL 40 (MISCELLANEOUS) ×2 IMPLANT
PUNCH AORTIC ROTATE 4.0MM (MISCELLANEOUS) IMPLANT
PUNCH AORTIC ROTATE 4.5MM 8IN (MISCELLANEOUS) IMPLANT
PUNCH AORTIC ROTATE 5MM 8IN (MISCELLANEOUS) IMPLANT
SET CARDIOPLEGIA MPS 5001102 (MISCELLANEOUS) ×2 IMPLANT
SOLUTION ANTI FOG 6CC (MISCELLANEOUS) ×2 IMPLANT
SPONGE LAP 18X18 X RAY DECT (DISPOSABLE) ×2 IMPLANT
SUT BONE WAX W31G (SUTURE) ×4 IMPLANT
SUT MNCRL AB 4-0 PS2 18 (SUTURE) ×6 IMPLANT
SUT PROLENE 3 0 SH DA (SUTURE) ×4 IMPLANT
SUT PROLENE 4 0 RB 1 (SUTURE)
SUT PROLENE 4 0 SH DA (SUTURE) IMPLANT
SUT PROLENE 4-0 RB1 .5 CRCL 36 (SUTURE) IMPLANT
SUT PROLENE 6 0 C 1 30 (SUTURE) ×8 IMPLANT
SUT PROLENE 7 0 BV1 MDA (SUTURE) ×8 IMPLANT
SUT PROLENE 8 0 BV175 6 (SUTURE) IMPLANT
SUT SILK  1 MH (SUTURE) ×2
SUT SILK 1 MH (SUTURE) IMPLANT
SUT SILK 2 0 SH CR/8 (SUTURE) ×2 IMPLANT
SUT STEEL 6MS V (SUTURE) ×4 IMPLANT
SUT STEEL STERNAL CCS#1 18IN (SUTURE) IMPLANT
SUT STEEL SZ 6 DBL 3X14 BALL (SUTURE) ×4 IMPLANT
SUT VIC AB 1 CTX 27 (SUTURE) ×4 IMPLANT
SUT VIC AB 1 CTX 36 (SUTURE) ×8
SUT VIC AB 1 CTX36XBRD ANBCTR (SUTURE) ×4 IMPLANT
SUT VIC AB 2-0 CT1 27 (SUTURE) ×12
SUT VIC AB 2-0 CT1 TAPERPNT 27 (SUTURE) IMPLANT
SUT VIC AB 2-0 CTX 27 (SUTURE) IMPLANT
SUT VIC AB 3-0 SH 27 (SUTURE) ×4
SUT VIC AB 3-0 SH 27X BRD (SUTURE) IMPLANT
SUT VIC AB 3-0 X1 27 (SUTURE) IMPLANT
SUT VICRYL 4-0 PS2 18IN ABS (SUTURE) IMPLANT
SYSTEM SAHARA CHEST DRAIN ATS (WOUND CARE) ×4 IMPLANT
TAPE CLOTH SURG 4X10 WHT LF (GAUZE/BANDAGES/DRESSINGS) ×2 IMPLANT
TAPE PAPER 2X10 WHT MICROPORE (GAUZE/BANDAGES/DRESSINGS) ×2 IMPLANT
TOWEL GREEN STERILE (TOWEL DISPOSABLE) ×4 IMPLANT
TOWEL GREEN STERILE FF (TOWEL DISPOSABLE) ×4 IMPLANT
TRAY FOLEY SILVER 16FR TEMP (SET/KITS/TRAYS/PACK) ×4 IMPLANT
TUBE FEEDING 8FR 16IN STR KANG (MISCELLANEOUS) ×4 IMPLANT
TUBING INSUFFLATION (TUBING) ×4 IMPLANT
UNDERPAD 30X30 (UNDERPADS AND DIAPERS) ×4 IMPLANT
WATER STERILE IRR 1000ML POUR (IV SOLUTION) ×8 IMPLANT
YANKAUER SUCT BULB TIP NO VENT (SUCTIONS) ×4 IMPLANT

## 2017-11-04 NOTE — Anesthesia Procedure Notes (Signed)
Central Venous Catheter Insertion Performed by: Suzette Battiest, MD, anesthesiologist Start/End3/22/2019 7:05 AM, 11/04/2017 7:35 AM Patient location: Pre-op. Preanesthetic checklist: patient identified, IV checked, site marked, risks and benefits discussed, surgical consent, monitors and equipment checked, pre-op evaluation, timeout performed and anesthesia consent Hand hygiene performed  and maximum sterile barriers used  PA cath was placed.Swan type:thermodilution PA Cath depth:45 Procedure performed without using ultrasound guided technique. Attempts: 1 Patient tolerated the procedure well with no immediate complications.

## 2017-11-04 NOTE — Progress Notes (Signed)
[facility] BRIEF SICU PROGRESS NOTE  Day of Surgery  S/P Procedure(s) (LRB): CORONARY ARTERY BYPASS GRAFTING (CABG) x three, using left internal mammary artery and right    leg greater saphenous vein (N/A) TRANSESOPHAGEAL ECHOCARDIOGRAM (TEE) (N/A)   Starting to wake on vent AAI paced w/ stable hemodynamics O2 sats 100% Chest tube output low  Plan: Continue routine early postop  Rexene Alberts, MD 11/04/2017 6:43 PM

## 2017-11-04 NOTE — OR Nursing (Signed)
14:10 - checked patient's AV fistula after surgery, strong thrill noted

## 2017-11-04 NOTE — Transfer of Care (Signed)
Immediate Anesthesia Transfer of Care Note  Patient: [name] Fury Sr.  Procedure(s) Performed: CORONARY ARTERY BYPASS GRAFTING (CABG) x three, using left internal mammary artery and right    leg greater saphenous vein (N/A Chest) TRANSESOPHAGEAL ECHOCARDIOGRAM (TEE) (N/A )  Patient Location: SICU  Anesthesia Type:General  Level of Consciousness: sedated, unresponsive and Patient remains intubated per anesthesia plan  Airway & Oxygen Therapy: Patient remains intubated per anesthesia plan and Patient placed on Ventilator (see vital sign flow sheet for setting)  Post-op Assessment: Report given to RN and Post -op Vital signs reviewed and stable  Post vital signs: Reviewed and stable  Last Vitals:  Vitals Value Taken Time  BP 118/62 11/04/2017  2:38 PM  Temp 36.3 C 11/04/2017  2:38 PM  Pulse 90 11/04/2017  2:38 PM  Resp 19 11/04/2017  2:38 PM  SpO2 100 % 11/04/2017  2:38 PM  Vitals shown include unvalidated device data.  Last Pain:  Vitals:   11/04/17 0435  TempSrc: Oral  PainSc:       Patients Stated Pain Goal: 0 (38/17/71 1657)  Complications: No apparent anesthesia complications

## 2017-11-04 NOTE — Progress Notes (Signed)
CXR showed partial RUL atelectasis  Will do recruitment and suctioning prior to weaning  Remo Lipps C. Roxan Hockey, MD [facility] [phone]

## 2017-11-04 NOTE — Brief Op Note (Signed)
10/28/2017 - 11/04/2017  2:36 PM  PATIENT:  [name] Labella Sr.  42 y.o. male  PRE-OPERATIVE DIAGNOSIS:  CAD  POST-OPERATIVE DIAGNOSIS:  CAD  PROCEDURE:  Procedure(s): CORONARY ARTERY BYPASS GRAFTING (CABG) x three, using left internal mammary artery and right    leg greater saphenous vein (N/A) TRANSESOPHAGEAL ECHOCARDIOGRAM (TEE) (N/A)  LIMA to LAD SVG to OM1 SVG to PDA  SURGEON:  Surgeon(s) and Role:    * Melrose Nakayama, MD - Primary  PHYSICIAN ASSISTANT:  Nicholes Rough, PA-C Erin Barrett, PA-C   ANESTHESIA:   general  EBL:  600 mL   BLOOD ADMINISTERED:none  DRAINS: ROUTINE   LOCAL MEDICATIONS USED:  NONE  SPECIMEN:  No Specimen  DISPOSITION OF SPECIMEN:  PATHOLOGY  COUNTS:  YES  DICTATION: .Dragon Dictation  PLAN OF CARE: Admit to inpatient   PATIENT DISPOSITION:  ICU - intubated and hemodynamically stable.   Delay start of Pharmacological VTE agent (>24hrs) due to surgical blood loss or risk of bleeding: yes  Severely calcified poor quality targets. Insufficient vein to bypass OM2 NOT A CANDIDATE FOR REDO BYPASS

## 2017-11-04 NOTE — Anesthesia Procedure Notes (Signed)
Central Venous Catheter Insertion Performed by: Suzette Battiest, MD, anesthesiologist Start/End3/22/2019 7:05 AM, 11/04/2017 9:35 AM Patient location: Pre-op. Preanesthetic checklist: patient identified, IV checked, site marked, risks and benefits discussed, surgical consent, monitors and equipment checked, pre-op evaluation, timeout performed and anesthesia consent Position: Trendelenburg Lidocaine 1% used for infiltration and patient sedated Hand hygiene performed , maximum sterile barriers used  and Seldinger technique used Catheter size: 8.5 Fr Total catheter length 10. Central line and PA cath was placed.Sheath introducer Swan type:thermodilution PA Cath depth:50 Procedure performed using ultrasound guided technique. Ultrasound Notes:anatomy identified, needle tip was noted to be adjacent to the nerve/plexus identified, no ultrasound evidence of intravascular and/or intraneural injection and image(s) printed for medical record Attempts: 5 or more Following insertion, line sutured, dressing applied and Biopatch. Post procedure assessment: blood return through all ports, free fluid flow and no air  Post procedure complications: local hematoma. Patient tolerated the procedure well with no immediate complications.

## 2017-11-04 NOTE — OR Nursing (Signed)
13:35 - 45 minute call to SICU charge nurse 14:00 - 20 minute call to SICU charge nurse

## 2017-11-04 NOTE — Anesthesia Postprocedure Evaluation (Signed)
Anesthesia Post Note  Patient: [name] Mabey Sr.  Procedure(s) Performed: CORONARY ARTERY BYPASS GRAFTING (CABG) x three, using left internal mammary artery and right    leg greater saphenous vein (N/A Chest) TRANSESOPHAGEAL ECHOCARDIOGRAM (TEE) (N/A )     Patient location during evaluation: SICU Anesthesia Type: General Level of consciousness: sedated Pain management: pain level controlled Vital Signs Assessment: post-procedure vital signs reviewed and stable Respiratory status: patient remains intubated per anesthesia plan Cardiovascular status: stable Postop Assessment: no apparent nausea or vomiting Anesthetic complications: no    Last Vitals:  Vitals:   11/04/17 1440 11/04/17 1500  BP:    Pulse:    Resp: 20 15  Temp: (!) 36.2 C (!) 36.3 C  SpO2:      Last Pain:  Vitals:   11/04/17 0435  TempSrc: Oral  PainSc:                  Tiajuana Amass

## 2017-11-04 NOTE — Anesthesia Procedure Notes (Signed)
Procedure Name: Intubation Date/Time: 11/04/2017 8:24 AM Performed by: Renato Shin, CRNA Pre-anesthesia Checklist: Patient identified, Emergency Drugs available, Suction available and Patient being monitored Patient Re-evaluated:Patient Re-evaluated prior to induction Oxygen Delivery Method: Circle system utilized Preoxygenation: Pre-oxygenation with 100% oxygen Induction Type: IV induction Ventilation: Mask ventilation without difficulty Laryngoscope Size: Miller and 3 Grade View: Grade II Tube type: Subglottic suction tube Tube size: 8.0 mm Number of attempts: 1 Airway Equipment and Method: Stylet Placement Confirmation: ETT inserted through vocal cords under direct vision,  positive ETCO2,  CO2 detector and breath sounds checked- equal and bilateral Secured at: 21 cm Tube secured with: Tape Dental Injury: Teeth and Oropharynx as per pre-operative assessment

## 2017-11-04 NOTE — Progress Notes (Signed)
*  PRELIMINARY RESULTS* Echocardiogram Echocardiogram Transesophageal has been performed.  Jennette Dubin 11/04/2017, 9:05 AM

## 2017-11-04 NOTE — Anesthesia Procedure Notes (Signed)
Arterial Line Insertion Start/End3/22/2019 7:45 AM, 11/04/2017 7:55 AM Performed by: Suzette Battiest, MD, anesthesiologist  Patient location: Pre-op. Preanesthetic checklist: patient identified, IV checked, site marked, risks and benefits discussed, surgical consent, monitors and equipment checked, pre-op evaluation, timeout performed and anesthesia consent Lidocaine 1% used for infiltration Right, brachial was placed Catheter size: 20 Fr Hand hygiene performed  and maximum sterile barriers used   Attempts: 1 Procedure performed using ultrasound guided technique. Ultrasound Notes:anatomy identified, needle tip was noted to be adjacent to the nerve/plexus identified, no ultrasound evidence of intravascular and/or intraneural injection and image(s) printed for medical record Following insertion, dressing applied and Biopatch. Post procedure assessment: normal and unchanged  Patient tolerated the procedure well with no immediate complications.

## 2017-11-04 NOTE — Progress Notes (Signed)
CSW continuing to follow and will support with discharge back to [facility] when patient medically ready.  Estanislado Emms, [place]

## 2017-11-04 NOTE — Op Note (Signed)
[name], [name]           ACCOUNT NO.:  [id number]  MEDICAL RECORD NO.:  [id number]  LOCATION:  [facility]                         FACILITY:  [facility]  PHYSICIAN:  Revonda Standard. Roxan Hockey, M.D.DATE OF BIRTH:  1976-02-09  DATE OF PROCEDURE:  11/04/2017 DATE OF DISCHARGE:                              OPERATIVE REPORT   PREOPERATIVE DIAGNOSIS:  Severe 3-vessel coronary artery disease with unstable angina.  POSTOPERATIVE DIAGNOSIS:  Severe 3-vessel coronary artery disease with unstable angina.  PROCEDURES:   Median sternotomy, extracorporeal circulation, Coronary artery bypass grafting x 3  Left internal mammary artery to left anterior descending,  Saphenous vein graft to obtuse marginal 1,  Saphenous vein graft to posterior descending.  SURGEON:  Revonda Standard. Roxan Hockey, M.D.  ASSISTANTS: 1. Nicholes Rough, PA. 2. Ellwood Handler, PA.  ANESTHESIA:  General.  FINDINGS:  Transesophageal echocardiography revealed preserved left ventricular function with no significant valvular pathology.  Vein harvested in open fashion from right thigh, satisfactory but relatively small-caliber vein.  Good quality left mammary.  Massively calcified coronary arteries, poor quality targets.  Not a candidate for redo bypass grafting.  CLINICAL NOTE:  [name] is a 42 year old man with a history of diabetes and end-stage renal failure.  He presented with severe chest pain on October 28, 2017.  He then underwent cardiac catheterization on October 31, 2017.  He was found to have severe 3-vessel coronary artery disease with critical stenoses in the LAD and right coronary artery. Ejection fraction was approximately 35%.  He was offered coronary artery bypass grafting.  The indications, risks, benefits, and alternatives were discussed in detail with the patient.  He understood the high-risk nature of the procedure.  He accepted those risks and wished to proceed. He was scheduled for surgery on November 04, 2017, to  allow time for Plavix effect to wear off.  He continued to have intermittent chest pain, relieved with nitroglycerin.  It was not felt safe to wait any longer and the patient was scheduled for surgery today.  He accepted the increased bleeding risk with proceeding at this time.  OPERATIVE NOTE:  [name] was brought to the preoperative holding area on November 04, 2017.  Dr. Suzette Battiest of the Anesthesia Service established intravenous access.  A Swan-Ganz catheter was placed and an arterial blood pressure monitoring line was placed.  He was taken to the operating room, anesthetized, and intubated.  Intravenous antibiotics were administered.  Transesophageal echocardiography was performed.  It showed an ejection fraction of approximately 35-40%.  There was no significant valvular pathology.  The chest, abdomen, and legs were prepped and draped in usual sterile fashion.  A median sternotomy was performed and the left internal mammary artery was harvested using standard technique.  The mammary artery was a good quality vessel.  Simultaneously with the mammary harvest, an incision made in the medial aspect of the right leg.  Two relatively small veins were identified just below the knee.  The larger of the 2 was harvested using endoscopic vein harvest, but when removed this vessel was not suitable for bypass grafting.  An incision was made in the left leg just above the knee and the leg was explored, but no suitable vein was  identified there either.  An incision was made in the right groin and the saphenofemoral junction was dissected out.  The saphenous vein was identified.  The actual greater saphenous vein was small caliber for the patient's size, but it was suitable for use as a bypass graft.  It was harvested from the groin to the lower thigh in an open fashion using bridged incisions.  As the knee was approached, the vein became very superficial and smaller and bifurcated  multiple times.  It was no longer suitable for use as a bypass graft.  There turned out to be adequate vein to graft the posterior descending and the first obtuse marginal, but not the second obtuse marginal.  The patient was fully heparinized.  The pericardium was opened.  The ascending aorta was inspected. It was free of atherosclerotic disease.  There was cardiomegaly.  The aorta was cannulated via concentric 2-0 Ethibond pledgeted pursestring sutures.  A dual-stage venous cannula was placed via pursestring suture in the right atrial appendage.  Anticoagulation was monitored with ACT measurement. Cardiopulmonary bypass was initiated.  Flows were maintained per protocol.  The patient was cooled to 34 degrees Celsius.  The coronary arteries were inspected and anastomotic sites were chosen.  The coronary arteries were massively enlarged proximally and massively calcified. There was no area of the vessels that did have calcification in the wall and all of the grafts were placed into areas that were hard, calcified vessels as there were no other options.  The conduits were inspected and cut to length.  A foam pad was placed in the pericardium to insulate the heart.  A temperature probe was placed in the myocardial septum and a cardioplegia cannula was placed in the ascending aorta.  The aorta was crossclamped.  The left ventricle was emptied via the aortic root vent.  Cardiac arrest then was achieved with a combination of cold antegrade blood cardioplegia and topical iced saline.  1.5 L of cardioplegia was administered.  There was a rapid diastolic arrest and there was septal cooling to 10 degrees Celsius.  A reversed saphenous vein graft was placed end-to-side to the posterior descending branch of the right coronary artery.  Again, this was a relatively large, but heavily calcified poor quality target vessel.  The vein was anastomosed end-to-side with a running 7-0 Prolene suture.   At completion of the anastomosis, a probe did pass distally.  Cardioplegia was administered.  There was good flow through the graft and good hemostasis at the anastomosis.  A reversed saphenous vein graft then was placed end-to-side to OM-1, again heavily calcified vessel, but a probe did pass distally.  The vein was satisfactory.  It was anastomosed end-to-side with a running 7-0 Prolene suture.  A probe did pass distally.  Cardioplegia was administered.  There was good flow and good hemostasis.  Additional cardioplegia also was administered down the aortic root.  The left internal mammary artery was brought through a window in the pericardium.  The distal end was beveled.  It was a 2-mm, good quality conduit.  It was anastomosed end-to-side to the distal LAD.  The distal LAD was a 2-mm, heavily calcified, poor quality target vessel, but a probe did pass distally to nearly the apex.  An end-to-side anastomosis was performed with a running 8-0 Prolene suture.  At the completion of anastomosis, the bulldog clamp was briefly removed.  Septal rewarming was noted.  The bulldog clamp was replaced.  There was good hemostasis. The mammary pedicle was  tacked to the epicardial surface of the heart with 6-0 Prolene sutures.  Additional cardioplegia was administered.  The vein grafts were cut to length.  The cardioplegia cannula was removed from the ascending aorta. The proximal vein graft anastomoses were performed to 4.0 mm punch aortotomies with running 6-0 Prolene sutures.  At the completion of the final proximal anastomosis, the patient was placed in Trendelenburg position.  Lidocaine was administered.  The bulldog clamp was again removed from the left mammary artery.  The aortic root was de-aired and the aortic crossclamp was removed.  The total crossclamp time was 65 minutes.  While rewarming was completed, all proximal and distal anastomoses were inspected for hemostasis.  Epicardial  pacing wires were placed on the right ventricle and right atrium.  When the patient had rewarmed to a core temperature of 37 degrees Celsius, he was weaned from cardiopulmonary bypass on the first attempt.  He was on a low-dose dopamine infusion at 3 mcg/kg/min at the time of separation from bypass. Total bypass time was 100 minutes.  The initial cardiac index was 1.9 L/min/m2, but that improved to greater than 2 with volume administration.  Post-bypass transesophageal echocardiography was essentially unchanged from the prebypass study.  A test dose of protamine was administered and it was well tolerated. The atrial and aortic cannulae were removed.  The remainder of the protamine was administered without incident.  The chest was irrigated with warm saline.  Hemostasis was achieved.  The pericardium was reapproximated over the ascending aorta and base of the heart with interrupted 3-0 silk sutures.  Left pleural and mediastinal chest tubes were placed through separate subcostal incisions.  The sternum was closed with a combination of single and double heavy gauge stainless steel wires.  The pectoralis fascia, subcutaneous tissue, and skin were closed in a standard fashion.  All sponge, needle, and instrument counts were correct at the end of the procedure.  The patient was taken from the operating room to the Surgical Intensive Care Unit intubated, in critical but stable condition.     Revonda Standard Roxan Hockey, M.D.     SCH/MEDQ  D:  11/04/2017  T:  11/04/2017  Job:  [id number]

## 2017-11-04 NOTE — Interval H&P Note (Signed)
History and Physical Interval Note:  11/04/2017 7:28 AM  [name] Labella Sr.  has presented today for surgery, with the diagnosis of CAD  The various methods of treatment have been discussed with the patient and family. After consideration of risks, benefits and other options for treatment, the patient has consented to  Procedure(s): CORONARY ARTERY BYPASS GRAFTING (CABG) (N/A) TRANSESOPHAGEAL ECHOCARDIOGRAM (TEE) (N/A) as a surgical intervention .  The patient's history has been reviewed, patient examined, no change in status, stable for surgery.  I have reviewed the patient's chart and labs.  Questions were answered to the patient's satisfaction.     Melrose Nakayama

## 2017-11-04 NOTE — Procedures (Signed)
Extubation Procedure Note  Patient Details:   Name: [name] Jaquith Sr. DOB: 1976/06/11 MRN: [id number]   Airway Documentation:   Patient extubated per protocol. NIF and VC within normal range. Pt had positive cuff leak prior to extubation. Pt able to voice and has strong cough post extubation. Placed on 4lpm humidified o2. Will continue to monitor.   Evaluation  O2 sats: stable throughout Complications: No apparent complications Patient did tolerate procedure well. Bilateral Breath Sounds: Diminished, Rhonchi   Yes  Ander Purpura 11/04/2017, 6:47 PM

## 2017-11-05 ENCOUNTER — Inpatient Hospital Stay (HOSPITAL_COMMUNITY): Payer: Medicaid Other

## 2017-11-05 LAB — GLUCOSE, CAPILLARY
GLUCOSE-CAPILLARY: 95 mg/dL (ref 65–99)
GLUCOSE-CAPILLARY: 104 mg/dL — AB (ref 65–99)
GLUCOSE-CAPILLARY: 137 mg/dL — AB (ref 65–99)
GLUCOSE-CAPILLARY: 127 mg/dL — AB (ref 65–99)
GLUCOSE-CAPILLARY: 154 mg/dL — AB (ref 65–99)
GLUCOSE-CAPILLARY: 111 mg/dL — AB (ref 65–99)
GLUCOSE-CAPILLARY: 116 mg/dL — AB (ref 65–99)
Glucose-Capillary: 100 mg/dL — ABNORMAL HIGH (ref 65–99)
Glucose-Capillary: 102 mg/dL — ABNORMAL HIGH (ref 65–99)
Glucose-Capillary: 106 mg/dL — ABNORMAL HIGH (ref 65–99)
Glucose-Capillary: 106 mg/dL — ABNORMAL HIGH (ref 65–99)
Glucose-Capillary: 136 mg/dL — ABNORMAL HIGH (ref 65–99)
Glucose-Capillary: 141 mg/dL — ABNORMAL HIGH (ref 65–99)
Glucose-Capillary: 147 mg/dL — ABNORMAL HIGH (ref 65–99)
Glucose-Capillary: 149 mg/dL — ABNORMAL HIGH (ref 65–99)
Glucose-Capillary: 52 mg/dL — ABNORMAL LOW (ref 65–99)
Glucose-Capillary: 90 mg/dL (ref 65–99)
Glucose-Capillary: 94 mg/dL (ref 65–99)
Glucose-Capillary: 98 mg/dL (ref 65–99)

## 2017-11-05 LAB — BASIC METABOLIC PANEL
ANION GAP: 15 (ref 5–15)
BUN: 52 mg/dL — AB (ref 6–20)
CALCIUM: 8.2 mg/dL — AB (ref 8.9–10.3)
CO2: 19 mmol/L — ABNORMAL LOW (ref 22–32)
Chloride: 99 mmol/L — ABNORMAL LOW (ref 101–111)
Creatinine, Ser: 9.9 mg/dL — ABNORMAL HIGH (ref 0.61–1.24)
GFR calc Af Amer: 7 mL/min — ABNORMAL LOW (ref >60)
GFR calc non Af Amer: 6 mL/min — ABNORMAL LOW (ref >60)
GLUCOSE: 122 mg/dL — AB (ref 65–99)
Potassium: 5.7 mmol/L — ABNORMAL HIGH (ref 3.5–5.1)
SODIUM: 133 mmol/L — AB (ref 135–145)

## 2017-11-05 LAB — POCT I-STAT, CHEM 8
BUN: 28 mg/dL — ABNORMAL HIGH (ref 6–20)
CALCIUM ION: 1.11 mmol/L — AB (ref 1.15–1.40)
Chloride: 95 mmol/L — ABNORMAL LOW (ref 101–111)
Creatinine, Ser: 6.4 mg/dL — ABNORMAL HIGH (ref 0.61–1.24)
Glucose, Bld: 115 mg/dL — ABNORMAL HIGH (ref 65–99)
HCT: 31 % — ABNORMAL LOW (ref 39.0–52.0)
HEMOGLOBIN: 10.5 g/dL — AB (ref 13.0–17.0)
Potassium: 4.1 mmol/L (ref 3.5–5.1)
SODIUM: 134 mmol/L — AB (ref 135–145)
TCO2: 26 mmol/L (ref 22–32)

## 2017-11-05 LAB — CBC
HCT: 33.1 % — ABNORMAL LOW (ref 39.0–52.0)
Hemoglobin: 10.7 g/dL — ABNORMAL LOW (ref 13.0–17.0)
MCH: 28.5 pg (ref 26.0–34.0)
MCHC: 32.3 g/dL (ref 30.0–36.0)
MCV: 88.3 fL (ref 78.0–100.0)
PLATELETS: 250 10*3/uL (ref 150–400)
RBC: 3.75 MIL/uL — ABNORMAL LOW (ref 4.22–5.81)
RDW: 15.7 % — AB (ref 11.5–15.5)
WBC: 14.6 10*3/uL — AB (ref 4.0–10.5)

## 2017-11-05 LAB — MAGNESIUM
Magnesium: 2.2 mg/dL (ref 1.7–2.4)
Magnesium: 2 mg/dL (ref 1.7–2.4)

## 2017-11-05 MED ORDER — INSULIN ASPART 100 UNIT/ML ~~LOC~~ SOLN: 0.0000 [IU] | SUBCUTANEOUS | Status: DC

## 2017-11-05 MED ORDER — LABETALOL HCL 5 MG/ML IV SOLN: 10.0000 mg | INTRAVENOUS | Status: DC | PRN

## 2017-11-05 MED ORDER — INSULIN DETEMIR 100 UNIT/ML ~~LOC~~ SOLN
20.0000 [IU] | Freq: Once | SUBCUTANEOUS | Status: AC
  Administered 2017-11-05: 20 [IU] via SUBCUTANEOUS
  Filled 2017-11-05: qty 0.2

## 2017-11-05 MED ORDER — ORAL CARE MOUTH RINSE
15.0000 mL | Freq: Two times a day (BID) | OROMUCOSAL | Status: DC
  Administered 2017-11-06: 15 mL via OROMUCOSAL

## 2017-11-05 MED ORDER — ENOXAPARIN SODIUM 30 MG/0.3ML ~~LOC~~ SOLN
30.0000 mg | SUBCUTANEOUS | Status: DC
  Administered 2017-11-06 – 2017-11-08 (×2): 30 mg via SUBCUTANEOUS
  Filled 2017-11-05 (×3): qty 0.3

## 2017-11-05 MED ORDER — CHLORHEXIDINE GLUCONATE 0.12 % MT SOLN
15.0000 mL | Freq: Two times a day (BID) | OROMUCOSAL | Status: DC
  Administered 2017-11-06 – 2017-11-07 (×2): 15 mL via OROMUCOSAL
  Filled 2017-11-05 (×2): qty 15

## 2017-11-05 MED ORDER — CEFAZOLIN SODIUM-DEXTROSE 1-4 GM/50ML-% IV SOLN
1.0000 g | Freq: Once | INTRAVENOUS | Status: AC
  Administered 2017-11-05: 1 g via INTRAVENOUS
  Filled 2017-11-05: qty 50

## 2017-11-05 NOTE — Progress Notes (Signed)
[facility] Progress Note   Dialysis Orders: [facility] MWF 4h  F200  500/800  73kg   2/2 bath  LUE AVF  Hep 2400 -Calcitriol 1.25 mcg PO TIWPTH 275 -Venofer 50 mg IV weekly (last dose 10/26/17)  Assessment: 1. Chest pain/ severe 3v CAD: now sp CABG 3.22.19 2. ESRD -MWF HD usual schedule. Had HD mon and Thursday this week.  3. Hypertension/volume- 9-10 kg up today, on IV NTG for ^BP 4. Anemia of ckd - HGB 14.2No ESA. 5. Metabolic bone disease -Ca 9 P 6Contiue VDRA/change to non Ca based binders only - prev on fosrenol/ca acetate - if P remains un - will increase Fosrenol to 2 ac. 6. Nutrition -Albumin 3.5Renal/Carb Mod diet, renal vit, nepro 7. DM per primary 8. Disp - patient living at [facility] since last August 9. 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  Plan -  HD today, 3h/ 2.5 L  Kelly Splinter MD [facility] pager [phone]   11/05/2017, 9:33 AM    Subjective:   Post op D#1, chest hurts, no SOB, on IV NTG for HTN, up 9-10kg  Objective Vitals:   11/05/17 0645 11/05/17 0700 11/05/17 0715 11/05/17 0800  BP:  (!) 126/59  126/61  Pulse: 76 84 83 82  Resp: 16 (!) 23 20 (!) 24  Temp: 98.4 F (36.9 C) 98.4 F (36.9 C) 98.4 F (36.9 C) 98.4 F (36.9 C)  TempSrc:      SpO2: 100% 100% 100% 99%  Weight:      Height:       Physical Exam General: slender male NAD Heart: RRR, sternotomy dressing in place Lungs: no rales Abdomen: soft NT Extremities: left BKA no LE edema Dialysis Access: left AVF + bruit  Additional Objective Labs: Basic Metabolic Panel: Recent Labs  Lab 11/02/17 0326 11/03/17 0412 11/03/17 2343 11/04/17 0409  11/04/17 1341 11/04/17 1452 11/04/17 2030 11/04/17 2048 11/05/17 0412  NA 131* 130* 132* 132*   < > 138 139  --  139 133*  K 4.4 4.8 3.9 4.3   < > 4.7 4.4  --  4.9 5.7*  CL 91* 87* 91* 91*   < > 101  --   --  104 99*  CO2 23 24 26 25   --   --   --   --   --  19*  GLUCOSE 140* 130* 317* 256*   < > 140* 157*   --  100* 122*  BUN 63* 90* 33* 44*   < > 39*  --   --  42* 52*  CREATININE 10.88* 13.42* 7.85* 8.83*   < > 8.70*  --  9.33* 9.20* 9.90*  CALCIUM 9.4 8.9 8.6* 8.8*  --   --   --   --   --  8.2*  PHOS 7.1* 6.5*  --  4.5  --   --   --   --   --   --    < > = values in this interval not displayed.   Liver Function Tests: Recent Labs  Lab 11/03/17 0412 11/03/17 2343 11/04/17 0409  AST  --  35  --   ALT  --  39  --   ALKPHOS  --  89  --   BILITOT  --  1.0  --   PROT  --  8.1  --   ALBUMIN 3.5 3.8 3.5   No results for input(s): LIPASE, AMYLASE in the last 168 hours. CBC: Recent Labs  Lab 11/03/17 0412 11/04/17 0409  11/04/17 1450  11/04/17 2030 11/04/17 2048 11/05/17 0412  WBC 8.3 8.1  --  18.6*  --  17.0*  --  14.6*  HGB 14.2 14.3   < > 11.5*   < > 11.7* 12.2* 10.7*  HCT 42.3 42.5   < > 34.6*   < > 35.7* 36.0* 33.1*  MCV 86.9 87.4  --  88.0  --  88.8  --  88.3  PLT 383 356   < > 237  --  263  --  250   < > = values in this interval not displayed.   Blood Culture    Component Value Date/Time   SDES BLOOD RIGHT HAND 03/07/2017 2304   SPECREQUEST  03/07/2017 2304    BOTTLES DRAWN AEROBIC AND ANAEROBIC Blood Culture adequate volume   CULT NO GROWTH 5 DAYS 03/07/2017 2304   REPTSTATUS 03/13/2017 FINAL 03/07/2017 2304    Cardiac Enzymes: Recent Labs  Lab 10/29/17 1120  TROPONINI 0.09*   CBG: Recent Labs  Lab 11/05/17 0111 11/05/17 0155 11/05/17 0300 11/05/17 0406 11/05/17 0621  GLUCAP 90 94 98 127* 149*   Iron Studies: No results for input(s): IRON, TIBC, TRANSFERRIN, FERRITIN in the last 72 hours. Lab Results  Component Value Date   INR 1.25 11/04/2017   INR 1.00 10/30/2017   INR 1.05 09/13/2014   Studies/Results: Dg Chest Port 1 View  Result Date: 11/05/2017 CLINICAL DATA:  CABG. EXAM: PORTABLE CHEST 1 VIEW COMPARISON:  Chest x-ray from yesterday. FINDINGS: Interval removal of the endotracheal and enteric tubes. Unchanged Swan-Ganz catheter and  mediastinal and left chest tubes. Stable cardiomediastinal silhouette. Improved aeration of the right upper lobe. Small right apical pneumothorax. The left lung is clear. No pleural effusion. No acute osseous abnormality. IMPRESSION: 1. Small right apical pneumothorax, likely present on the prior study, but difficult to see due to opacification of the right upper lobe. Markedly improved aeration of the right upper lobe on today's study. Electronically Signed   By: Titus Dubin M.D.   On: 11/05/2017 07:51   Dg Chest Port 1 View  Result Date: 11/04/2017 CLINICAL DATA:  Post CABG x3. EXAM: PORTABLE CHEST 1 VIEW COMPARISON:  10/28/2017 FINDINGS: Post median sternotomy. Endotracheal tube has tip 3.2 cm above the carina. Nasogastric tube courses into the stomach and off the inferior portion of the film. Left-sided chest tube in adequate position. Mediastinal drain is in place. Left IJ Swan-Ganz catheter has tip over the main pulmonary artery segment. Lungs are adequately inflated as there is elevation of the minor fissure with moderate opacification over the right upper lobe. Right lower lobe and left lung are clear. Cardiomediastinal silhouette and remainder the exam is unchanged. IMPRESSION: Opacification with volume loss of the right upper lobe suggesting collapse/atelectasis. Multiple tubes and lines as described. Electronically Signed   By: Marin Olp M.D.   On: 11/04/2017 15:10   Medications: . sodium chloride    . albumin human    .  ceFAZolin (ANCEF) IV    . famotidine (PEPCID) IV Stopped (11/04/17 1642)  . lactated ringers Stopped (11/04/17 1637)  . nitroGLYCERIN 80 mcg/min (11/05/17 0622)   . acetaminophen  1,000 mg Oral Q6H  . aspirin EC  325 mg Oral Daily  . atorvastatin  80 mg Oral q1800  . bisacodyl  10 mg Oral Daily   Or  . bisacodyl  10 mg Rectal Daily  . calcitRIOL  1.25 mcg Oral Q M,W,F-HD  . chlorhexidine  15 mL Mouth Rinse BID  . docusate sodium  200 mg Oral Daily  . [START  ON 11/06/2017] enoxaparin (LOVENOX) injection  30 mg Subcutaneous Q48H  . insulin aspart  0-24 Units Subcutaneous Q4H  . insulin detemir  20 Units Subcutaneous Once  . lamoTRIgine  150 mg Oral QHS  . lanthanum  1,000 mg Oral TID WC  . mouth rinse  15 mL Mouth Rinse q12n4p  . metoprolol tartrate  12.5 mg Oral BID  . [START ON 11/06/2017] pantoprazole  40 mg Oral Daily  . ramelteon  8 mg Oral QHS  . sodium chloride flush  3 mL Intravenous Q12H

## 2017-11-05 NOTE — Progress Notes (Signed)
PHARMACY NOTE:  ANTIMICROBIAL RENAL DOSAGE ADJUSTMENT  Current antimicrobial regimen includes a mismatch between antimicrobial dosage and estimated renal function.  As per policy approved by the Pharmacy & Therapeutics and Medical Executive Committees, the antimicrobial dosage will be adjusted accordingly.  Current antimicrobial dosage:  Cefazolin 2g IV q12h  Indication: Surgical prophylaxis  Renal Function:  Estimated Creatinine Clearance: 11.5 mL/min (A) (by C-G formula based on SCr of 9.9 mg/dL (H)). [x]      On intermittent HD, scheduled: Mon/Wed/Fri at home, currently off schedule perioperatively []      On CRRT    Antimicrobial dosage has been changed to:  Cefazolin 1g IV q24h   Additional comments: Will give one additional dose tonight to allow 48 hours total of prophylactic coverage   Thank you for allowing pharmacy to be a part of this patient's care.  Arrie Senate, PharmD, BCPS PGY-2 Cardiology Pharmacy Resident Pager: [phone] 11/05/2017

## 2017-11-05 NOTE — Progress Notes (Signed)
Arrived to patient room 2H-07 at 1400.  Reviewed treatment plan and this RN agrees.  Report received from bedside RN, Donnetta Simpers.  Consent verified.  Patient A & O X 4. Lung sounds diminished to ausculation in all fields. No edema. Cardiac: NSR.  Prepped LUAVF with alcohol and cannulated with two 15 gauge needles.  Pulsation of blood noted.  Flushed access well with saline per protocol.  Connected and secured lines and initiated tx at 1415.  UF goal of 3000 mL and net fluid removal of 2500 mL.  Will continue to monitor.

## 2017-11-05 NOTE — Progress Notes (Signed)
Dialysis treatment completed.  2500 mL ultrafiltrated and net fluid removal 2000 mL.    Patient status unchanged. Lung sounds diminished to ausculation in all fields. No edema. Cardiac: NSR.  Disconnected lines and removed needles.  Pressure held for 10 minutes and band aid/gauze dressing applied.  Report given to bedside RN, Donnetta Simpers.

## 2017-11-05 NOTE — Progress Notes (Signed)
[address]       [place] [address]             [phone]        CARDIOTHORACIC SURGERY PROGRESS NOTE   R1 Day Post-Op Procedure(s) (LRB): CORONARY ARTERY BYPASS GRAFTING (CABG) x three, using left internal mammary artery and right    leg greater saphenous vein (N/A) TRANSESOPHAGEAL ECHOCARDIOGRAM (TEE) (N/A)  Subjective: Feels sore in chest.  Not very talkative.  Denies SOB  Objective: Vital signs: BP Readings from Last 1 Encounters:  11/05/17 126/61   Pulse Readings from Last 1 Encounters:  11/05/17 82   Resp Readings from Last 1 Encounters:  11/05/17 (!) 24   Temp Readings from Last 1 Encounters:  11/05/17 98.4 F (36.9 C)    Hemodynamics: PAP: (17-40)/(6-22) 21/10 CO:  [5.1 L/min-7.8 L/min] 6.5 L/min CI:  [2.6 L/min/m2-3.9 L/min/m2] 3.3 L/min/m2  Physical Exam:  Rhythm:   sinus  Breath sounds: clear  Heart sounds:  RRR  Incisions:  Dressings dry, intact  Abdomen:  Soft, non-distended, non-tender  Extremities:  Warm, well-perfused  Chest tubes:  low volume thin serosanguinous output, no air leak    Intake/Output from previous day: 03/22 0701 - 03/23 0700 In: 3743.1 [I.V.:2893.1; Blood:300; IV Piggyback:550] Out: 1019 [Emesis/NG output:200; Blood:600; Chest Tube:219] Intake/Output this shift: No intake/output data recorded.  Lab Results:  CBC: Recent Labs    11/04/17 2030 11/04/17 2048 11/05/17 0412  WBC 17.0*  --  14.6*  HGB 11.7* 12.2* 10.7*  HCT 35.7* 36.0* 33.1*  PLT 263  --  250    BMET:  Recent Labs    11/04/17 0409  11/04/17 2048 11/05/17 0412  NA 132*   < > 139 133*  K 4.3   < > 4.9 5.7*  CL 91*   < > 104 99*  CO2 25  --   --  19*  GLUCOSE 256*   < > 100* 122*  BUN 44*   < > 42* 52*  CREATININE 8.83*   < > 9.20* 9.90*  CALCIUM 8.8*  --   --  8.2*   < > = values in this interval not displayed.     PT/INR:   Recent Labs    11/04/17 1450  LABPROT 15.6*  INR 1.25    CBG (last 3)  Recent  Labs    11/05/17 0300 11/05/17 0406 11/05/17 0621  GLUCAP 98 127* 149*    ABG    Component Value Date/Time   PHART 7.342 (L) 11/04/2017 1946   PCO2ART 41.1 11/04/2017 1946   PO2ART 148.0 (H) 11/04/2017 1946   HCO3 22.3 11/04/2017 1946   TCO2 23 11/04/2017 2048   ACIDBASEDEF 3.0 (H) 11/04/2017 1946   O2SAT 99.0 11/04/2017 1946    CXR: PORTABLE CHEST 1 VIEW  COMPARISON:  Chest x-ray from yesterday.  FINDINGS: Interval removal of the endotracheal and enteric tubes. Unchanged Swan-Ganz catheter and mediastinal and left chest tubes. Stable cardiomediastinal silhouette. Improved aeration of the right upper lobe. Small right apical pneumothorax. The left lung is clear. No pleural effusion. No acute osseous abnormality.  IMPRESSION: 1. Small right apical pneumothorax, likely present on the prior study, but difficult to see due to opacification of the right upper lobe. Markedly improved aeration of the right upper lobe on today's study.   Electronically Signed   By: Titus Dubin M.D.   On: 11/05/2017 07:51   EKG: NSR w/out obvious acute ischemic changes  Assessment/Plan: S/P Procedure(s) (LRB): CORONARY ARTERY BYPASS GRAFTING (CABG) x three, using left internal mammary artery and right    leg greater saphenous vein (N/A) TRANSESOPHAGEAL ECHOCARDIOGRAM (TEE) (N/A)  Overall stable POD1 Maintaining NSR w/ stable hemodynamics on IV NTG for hypertension, PA pressures low Breathing comfortably w/ O2 sats 99-100% on 2 L/min via [place] ESRD on HD with expected post op volume excess, weight reportedly 15-16 lbs > preop Expected post op acute blood loss anemia, mild   Mobilize  D/C lines D/C tubes later today or tomorrow, depending on output HD per Nephrology team, D/C Aline if patient is not to undergo HD today   Rexene Alberts, MD 11/05/2017 9:02 AM

## 2017-11-06 ENCOUNTER — Inpatient Hospital Stay (HOSPITAL_COMMUNITY): Payer: Medicaid Other

## 2017-11-06 LAB — BASIC METABOLIC PANEL
ANION GAP: 13 (ref 5–15)
BUN: 33 mg/dL — AB (ref 6–20)
CALCIUM: 8.6 mg/dL — AB (ref 8.9–10.3)
CO2: 25 mmol/L (ref 22–32)
CREATININE: 7.99 mg/dL — AB (ref 0.61–1.24)
Chloride: 95 mmol/L — ABNORMAL LOW (ref 101–111)
GFR calc Af Amer: 9 mL/min — ABNORMAL LOW (ref >60)
GFR, EST NON AFRICAN AMERICAN: 8 mL/min — AB (ref >60)
GLUCOSE: 123 mg/dL — AB (ref 65–99)
Potassium: 4.9 mmol/L (ref 3.5–5.1)
Sodium: 133 mmol/L — ABNORMAL LOW (ref 135–145)

## 2017-11-06 LAB — CBC
HCT: 27.8 % — ABNORMAL LOW (ref 39.0–52.0)
HEMOGLOBIN: 9 g/dL — AB (ref 13.0–17.0)
MCH: 28.8 pg (ref 26.0–34.0)
MCHC: 32.4 g/dL (ref 30.0–36.0)
MCV: 88.8 fL (ref 78.0–100.0)
Platelets: 212 10*3/uL (ref 150–400)
RBC: 3.13 MIL/uL — ABNORMAL LOW (ref 4.22–5.81)
RDW: 15.9 % — AB (ref 11.5–15.5)
WBC: 11.8 10*3/uL — ABNORMAL HIGH (ref 4.0–10.5)

## 2017-11-06 LAB — GLUCOSE, CAPILLARY
GLUCOSE-CAPILLARY: 110 mg/dL — AB (ref 65–99)
GLUCOSE-CAPILLARY: 151 mg/dL — AB (ref 65–99)
GLUCOSE-CAPILLARY: 173 mg/dL — AB (ref 65–99)
Glucose-Capillary: 105 mg/dL — ABNORMAL HIGH (ref 65–99)
Glucose-Capillary: 121 mg/dL — ABNORMAL HIGH (ref 65–99)
Glucose-Capillary: 148 mg/dL — ABNORMAL HIGH (ref 65–99)

## 2017-11-06 MED ORDER — SODIUM CHLORIDE 0.9 % IV SOLN: 250.0000 mL | INTRAVENOUS | Status: DC | PRN

## 2017-11-06 MED ORDER — SODIUM CHLORIDE 0.9% FLUSH
3.0000 mL | Freq: Two times a day (BID) | INTRAVENOUS | Status: DC
  Administered 2017-11-07 (×2): 3 mL via INTRAVENOUS

## 2017-11-06 MED ORDER — MOVING RIGHT ALONG BOOK
Freq: Once | Status: AC
  Administered 2017-11-06: 10:00:00
  Filled 2017-11-06: qty 1

## 2017-11-06 MED ORDER — INSULIN ASPART 100 UNIT/ML ~~LOC~~ SOLN
0.0000 [IU] | Freq: Three times a day (TID) | SUBCUTANEOUS | Status: DC
  Administered 2017-11-06 (×2): 2 [IU] via SUBCUTANEOUS
  Administered 2017-11-06: 4 [IU] via SUBCUTANEOUS

## 2017-11-06 MED ORDER — SODIUM CHLORIDE 0.9% FLUSH: 3.0000 mL | INTRAVENOUS | Status: DC | PRN

## 2017-11-06 NOTE — Progress Notes (Signed)
[facility] Progress Note   Dialysis Orders: [facility] MWF 4h  F200  500/800  73kg   2/2 bath  LUE AVF  Hep 2400 -Calcitriol 1.25 mcg PO TIWPTH 275 -Venofer 50 mg IV weekly (last dose 10/26/17)  Assessment: 1. Chest pain/ severe 3v CAD: now sp CABG 3.22.19 2. ESRD -MWF HD usual schedule. Had short HD yest w/ 2 L off, will plan similar HD today.  Plan HD again Monday then can prob go to 3 HD per wk thereafter.  3. Hypertension/volume- vol a little better, 6-7kg up now 4. Anemia of ckd - HGB 14.2No ESA. 5. Metabolic bone disease -Ca 9 P 6Contiue VDRA/change to non Ca based binders only - prev on fosrenol/ca acetate - if P remains un - will increase Fosrenol to 2 ac. 6. Nutrition -Albumin 3.5Renal/Carb Mod diet, renal vit, nepro 7. DM per primary 8. Disp - patient living at [facility] since last August 9. 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  Plan -  HD again today, 2.5 hrs.   Kelly Splinter MD [facility] pager [phone]   11/06/2017, 12:11 PM    Subjective:   Lines and chest tubes removed. Stable, no SOB or cough, lying flat. Had HD yest w/ net 2 L off.  BP's are stable.   Objective Vitals:   11/06/17 0500 11/06/17 0600 11/06/17 0700 11/06/17 0940  BP: (!) 109/44 (!) 111/52 (!) 132/50 (!) 142/45  Pulse: 87 91 87 98  Resp: 12 13 14    Temp:      TempSrc:      SpO2: 100% 100% 100%   Weight: 80.5 kg (177 lb 7.5 oz)  79.3 kg (174 lb 13.2 oz)   Height:       Physical Exam General: slender male NAD Heart: RRR, sternotomy dressing in place Lungs: no rales Abdomen: soft NT Extremities: left BKA mild LE edema Dialysis Access: left AVF + bruit  Additional Objective Labs: Basic Metabolic Panel: Recent Labs  Lab 11/02/17 0326 11/03/17 0412  11/04/17 0409  11/05/17 0412 11/05/17 1851 11/06/17 0356  NA 131* 130*   < > 132*   < > 133* 134* 133*  K 4.4 4.8   < > 4.3   < > 5.7* 4.1 4.9  CL 91* 87*   < > 91*   < > 99* 95* 95*  CO2 23 24   < > 25   --  19*  --  25  GLUCOSE 140* 130*   < > 256*   < > 122* 115* 123*  BUN 63* 90*   < > 44*   < > 52* 28* 33*  CREATININE 10.88* 13.42*   < > 8.83*   < > 9.90* 6.40* 7.99*  CALCIUM 9.4 8.9   < > 8.8*  --  8.2*  --  8.6*  PHOS 7.1* 6.5*  --  4.5  --   --   --   --    < > = values in this interval not displayed.   Liver Function Tests: Recent Labs  Lab 11/03/17 0412 11/03/17 2343 11/04/17 0409  AST  --  35  --   ALT  --  39  --   ALKPHOS  --  89  --   BILITOT  --  1.0  --   PROT  --  8.1  --   ALBUMIN 3.5 3.8 3.5   No results for input(s): LIPASE, AMYLASE in the last 168 hours. CBC: Recent Labs  Lab 11/04/17 0409  11/04/17 1450  11/04/17 2030  11/05/17 0412 11/05/17 1851 11/06/17 0356  WBC 8.1  --  18.6*  --  17.0*  --  14.6*  --  11.8*  HGB 14.3   < > 11.5*   < > 11.7*   < > 10.7* 10.5* 9.0*  HCT 42.5   < > 34.6*   < > 35.7*   < > 33.1* 31.0* 27.8*  MCV 87.4  --  88.0  --  88.8  --  88.3  --  88.8  PLT 356   < > 237  --  263  --  250  --  212   < > = values in this interval not displayed.   Blood Culture    Component Value Date/Time   SDES BLOOD RIGHT HAND 03/07/2017 2304   SPECREQUEST  03/07/2017 2304    BOTTLES DRAWN AEROBIC AND ANAEROBIC Blood Culture adequate volume   CULT NO GROWTH 5 DAYS 03/07/2017 2304   REPTSTATUS 03/13/2017 FINAL 03/07/2017 2304    Cardiac Enzymes: No results for input(s): CKTOTAL, CKMB, CKMBINDEX, TROPONINI in the last 168 hours. CBG: Recent Labs  Lab 11/05/17 2016 11/05/17 2332 11/06/17 0001 11/06/17 0359 11/06/17 0844  GLUCAP 106* 52* 105* 110* 121*   Iron Studies: No results for input(s): IRON, TIBC, TRANSFERRIN, FERRITIN in the last 72 hours. Lab Results  Component Value Date   INR 1.25 11/04/2017   INR 1.00 10/30/2017   INR 1.05 09/13/2014   Studies/Results: Dg Chest Port 1 View  Result Date: 11/06/2017 CLINICAL DATA:  Atelectasis, history coronary artery disease, CHF, end-stage renal disease, hypertension, type II  diabetes mellitus EXAM: PORTABLE CHEST 1 VIEW COMPARISON:  Portable exam 0433 hours compared to 11/05/2017 FINDINGS: Mediastinal drain and LEFT thoracostomy tube again seen. Interval removal of Swan-Ganz catheter with persistent visualization of LEFT jugular catheter. Epicardial pacing wires noted. Enlargement of cardiac silhouette post CABG. Pulmonary vascular congestion. Probable atelectasis in LEFT lower lobe. Remaining lungs clear. No gross pleural effusion or pneumothorax. IMPRESSION: Enlargement of cardiac silhouette with pulmonary vascular congestion post CABG. Probable atelectasis in LEFT lower lobe. Electronically Signed   By: Lavonia Dana M.D.   On: 11/06/2017 07:25   Dg Chest Port 1 View  Result Date: 11/05/2017 CLINICAL DATA:  CABG. EXAM: PORTABLE CHEST 1 VIEW COMPARISON:  Chest x-ray from yesterday. FINDINGS: Interval removal of the endotracheal and enteric tubes. Unchanged Swan-Ganz catheter and mediastinal and left chest tubes. Stable cardiomediastinal silhouette. Improved aeration of the right upper lobe. Small right apical pneumothorax. The left lung is clear. No pleural effusion. No acute osseous abnormality. IMPRESSION: 1. Small right apical pneumothorax, likely present on the prior study, but difficult to see due to opacification of the right upper lobe. Markedly improved aeration of the right upper lobe on today's study. Electronically Signed   By: Titus Dubin M.D.   On: 11/05/2017 07:51   Dg Chest Port 1 View  Result Date: 11/04/2017 CLINICAL DATA:  Post CABG x3. EXAM: PORTABLE CHEST 1 VIEW COMPARISON:  10/28/2017 FINDINGS: Post median sternotomy. Endotracheal tube has tip 3.2 cm above the carina. Nasogastric tube courses into the stomach and off the inferior portion of the film. Left-sided chest tube in adequate position. Mediastinal drain is in place. Left IJ Swan-Ganz catheter has tip over the main pulmonary artery segment. Lungs are adequately inflated as there is elevation of  the minor fissure with moderate opacification over the right upper lobe. Right lower lobe and left lung  are clear. Cardiomediastinal silhouette and remainder the exam is unchanged. IMPRESSION: Opacification with volume loss of the right upper lobe suggesting collapse/atelectasis. Multiple tubes and lines as described. Electronically Signed   By: Marin Olp M.D.   On: 11/04/2017 15:10   Medications: . sodium chloride    . sodium chloride    . lactated ringers Stopped (11/04/17 1637)   . acetaminophen  1,000 mg Oral Q6H  . aspirin EC  325 mg Oral Daily  . atorvastatin  80 mg Oral q1800  . bisacodyl  10 mg Oral Daily   Or  . bisacodyl  10 mg Rectal Daily  . calcitRIOL  1.25 mcg Oral Q M,W,F-HD  . chlorhexidine  15 mL Mouth Rinse BID  . docusate sodium  200 mg Oral Daily  . enoxaparin (LOVENOX) injection  30 mg Subcutaneous Q48H  . insulin aspart  0-24 Units Subcutaneous TID AC & HS  . lamoTRIgine  150 mg Oral QHS  . lanthanum  1,000 mg Oral TID WC  . mouth rinse  15 mL Mouth Rinse q12n4p  . metoprolol tartrate  12.5 mg Oral BID  . moving right along book   Does not apply Once  . pantoprazole  40 mg Oral Daily  . ramelteon  8 mg Oral QHS  . sodium chloride flush  3 mL Intravenous Q12H  . sodium chloride flush  3 mL Intravenous Q12H

## 2017-11-06 NOTE — Progress Notes (Addendum)
[address]       [place] [address]             [phone]        CARDIOTHORACIC SURGERY PROGRESS NOTE   R2 Days Post-Op Procedure(s) (LRB): CORONARY ARTERY BYPASS GRAFTING (CABG) x three, using left internal mammary artery and right    leg greater saphenous vein (N/A) TRANSESOPHAGEAL ECHOCARDIOGRAM (TEE) (N/A)  Subjective: Feels okay.  Didn't sleep.  Hungry.  Denies SOB.  Sore in chest  Objective: Vital signs: BP Readings from Last 1 Encounters:  11/06/17 (!) 142/45   Pulse Readings from Last 1 Encounters:  11/06/17 98   Resp Readings from Last 1 Encounters:  11/06/17 14   Temp Readings from Last 1 Encounters:  11/06/17 98.5 F (36.9 C) (Oral)    Hemodynamics: PAP: (28)/(14-16) 28/16  Physical Exam:  Rhythm:   sinus  Breath sounds: clear  Heart sounds:  RRR  Incisions:  Dressings dry, intact  Abdomen:  Soft, non-distended, non-tender  Extremities:  Warm, well-perfused   Intake/Output from previous day: 03/23 0701 - 03/24 0700 In: 923.3 [P.O.:520; I.V.:303.3; IV Piggyback:100] Out: 2095 [Chest Tube:95] Intake/Output this shift: No intake/output data recorded.  Lab Results:  CBC: Recent Labs    11/05/17 0412 11/05/17 1851 11/06/17 0356  WBC 14.6*  --  11.8*  HGB 10.7* 10.5* 9.0*  HCT 33.1* 31.0* 27.8*  PLT 250  --  212    BMET:  Recent Labs    11/05/17 0412 11/05/17 1851 11/06/17 0356  NA 133* 134* 133*  K 5.7* 4.1 4.9  CL 99* 95* 95*  CO2 19*  --  25  GLUCOSE 122* 115* 123*  BUN 52* 28* 33*  CREATININE 9.90* 6.40* 7.99*  CALCIUM 8.2*  --  8.6*     PT/INR:   Recent Labs    11/04/17 1450  LABPROT 15.6*  INR 1.25    CBG (last 3)  Recent Labs    11/06/17 0001 11/06/17 0359 11/06/17 0844  GLUCAP 105* 110* 121*    ABG    Component Value Date/Time   PHART 7.342 (L) 11/04/2017 1946   PCO2ART 41.1 11/04/2017 1946   PO2ART 148.0 (H) 11/04/2017 1946   HCO3 22.3 11/04/2017 1946   TCO2 26 11/05/2017  1851   ACIDBASEDEF 3.0 (H) 11/04/2017 1946   O2SAT 99.0 11/04/2017 1946    CXR: PORTABLE CHEST 1 VIEW  COMPARISON:  Portable exam 0433 hours compared to 11/05/2017  FINDINGS: Mediastinal drain and LEFT thoracostomy tube again seen.  Interval removal of Swan-Ganz catheter with persistent visualization of LEFT jugular catheter.  Epicardial pacing wires noted.  Enlargement of cardiac silhouette post CABG.  Pulmonary vascular congestion.  Probable atelectasis in LEFT lower lobe.  Remaining lungs clear.  No gross pleural effusion or pneumothorax.  IMPRESSION: Enlargement of cardiac silhouette with pulmonary vascular congestion post CABG.  Probable atelectasis in LEFT lower lobe.   Electronically Signed   By: Lavonia Dana M.D.   On: 11/06/2017 07:25  Assessment/Plan: S/P Procedure(s) (LRB): CORONARY ARTERY BYPASS GRAFTING (CABG) x three, using left internal mammary artery and right    leg greater saphenous vein (N/A) TRANSESOPHAGEAL ECHOCARDIOGRAM (TEE) (N/A)  Overall stable POD2 Maintaining NSR w/ stable BP Breathing comfortably w/ O2 sats 99-100% on 2 L/min via [place] ESRD on HD with expected post op volume excess, tolerated HD yesterday and weight down 7 lbs but still reportedly 8-9 lbs > preop Expected post op acute blood loss  anemia, mild   Mobilize  D/C lines  D/C tubes   HD per Nephrology team  PT consult to assist w/ mobility and rehab   Rexene Alberts, MD 11/06/2017 9:53 AM

## 2017-11-07 ENCOUNTER — Encounter (HOSPITAL_COMMUNITY): Payer: Self-pay | Admitting: Thoracic Surgery (Cardiothoracic Vascular Surgery)

## 2017-11-07 ENCOUNTER — Inpatient Hospital Stay (HOSPITAL_COMMUNITY): Payer: Medicaid Other

## 2017-11-07 LAB — CBC
HEMATOCRIT: 28.6 % — AB (ref 39.0–52.0)
HEMOGLOBIN: 9.3 g/dL — AB (ref 13.0–17.0)
MCH: 29 pg (ref 26.0–34.0)
MCHC: 32.5 g/dL (ref 30.0–36.0)
MCV: 89.1 fL (ref 78.0–100.0)
Platelets: 273 10*3/uL (ref 150–400)
RBC: 3.21 MIL/uL — ABNORMAL LOW (ref 4.22–5.81)
RDW: 15.5 % (ref 11.5–15.5)
WBC: 11 10*3/uL — ABNORMAL HIGH (ref 4.0–10.5)

## 2017-11-07 LAB — GLUCOSE, CAPILLARY
GLUCOSE-CAPILLARY: 125 mg/dL — AB (ref 65–99)
GLUCOSE-CAPILLARY: 162 mg/dL — AB (ref 65–99)
GLUCOSE-CAPILLARY: 189 mg/dL — AB (ref 65–99)
Glucose-Capillary: 179 mg/dL — ABNORMAL HIGH (ref 65–99)

## 2017-11-07 LAB — BASIC METABOLIC PANEL
Anion gap: 15 (ref 5–15)
BUN: 32 mg/dL — AB (ref 6–20)
CALCIUM: 8.2 mg/dL — AB (ref 8.9–10.3)
CHLORIDE: 93 mmol/L — AB (ref 101–111)
CO2: 25 mmol/L (ref 22–32)
CREATININE: 7.66 mg/dL — AB (ref 0.61–1.24)
GFR calc non Af Amer: 8 mL/min — ABNORMAL LOW (ref >60)
GFR, EST AFRICAN AMERICAN: 9 mL/min — AB (ref >60)
GLUCOSE: 176 mg/dL — AB (ref 65–99)
Potassium: 4.1 mmol/L (ref 3.5–5.1)
Sodium: 133 mmol/L — ABNORMAL LOW (ref 135–145)

## 2017-11-07 MED ORDER — POLYETHYLENE GLYCOL 3350 17 G PO PACK: 17.0000 g | PACK | Freq: Every day | ORAL | Status: DC | PRN

## 2017-11-07 MED ORDER — RENA-VITE PO TABS
1.0000 | ORAL_TABLET | Freq: Every day | ORAL | Status: DC
  Administered 2017-11-07 – 2017-11-08 (×2): 1 via ORAL
  Filled 2017-11-07 (×2): qty 1

## 2017-11-07 MED ORDER — CARVEDILOL 6.25 MG PO TABS
6.2500 mg | ORAL_TABLET | Freq: Two times a day (BID) | ORAL | Status: DC
  Administered 2017-11-07 – 2017-11-09 (×5): 6.25 mg via ORAL
  Filled 2017-11-07 (×5): qty 1

## 2017-11-07 MED ORDER — SODIUM CHLORIDE 0.9% FLUSH
3.0000 mL | Freq: Two times a day (BID) | INTRAVENOUS | Status: DC
  Administered 2017-11-07 – 2017-11-09 (×4): 3 mL via INTRAVENOUS

## 2017-11-07 MED ORDER — MOVING RIGHT ALONG BOOK
Freq: Once | Status: AC
  Administered 2017-11-07: 1
  Filled 2017-11-07 (×2): qty 1

## 2017-11-07 MED ORDER — METHOCARBAMOL 500 MG PO TABS: 500.0000 mg | ORAL_TABLET | Freq: Four times a day (QID) | ORAL | Status: DC | PRN

## 2017-11-07 MED ORDER — CLOPIDOGREL BISULFATE 75 MG PO TABS
75.0000 mg | ORAL_TABLET | Freq: Every day | ORAL | Status: DC
  Administered 2017-11-07 – 2017-11-09 (×3): 75 mg via ORAL
  Filled 2017-11-07 (×3): qty 1

## 2017-11-07 MED ORDER — INSULIN GLARGINE 100 UNIT/ML ~~LOC~~ SOLN
7.0000 [IU] | Freq: Every day | SUBCUTANEOUS | Status: DC
  Administered 2017-11-07: 7 [IU] via SUBCUTANEOUS
  Filled 2017-11-07: qty 0.07

## 2017-11-07 MED ORDER — SODIUM CHLORIDE 0.9% FLUSH: 3.0000 mL | INTRAVENOUS | Status: DC | PRN

## 2017-11-07 MED ORDER — NEPRO/CARBSTEADY PO LIQD
237.0000 mL | Freq: Two times a day (BID) | ORAL | Status: DC
  Administered 2017-11-07 – 2017-11-08 (×2): 237 mL via ORAL
  Filled 2017-11-07 (×3): qty 237

## 2017-11-07 MED ORDER — INSULIN ASPART 100 UNIT/ML ~~LOC~~ SOLN
0.0000 [IU] | Freq: Three times a day (TID) | SUBCUTANEOUS | Status: DC
  Administered 2017-11-07 – 2017-11-08 (×3): 2 [IU] via SUBCUTANEOUS
  Administered 2017-11-08 – 2017-11-09 (×2): 3 [IU] via SUBCUTANEOUS

## 2017-11-07 MED ORDER — SODIUM CHLORIDE 0.9 % IV SOLN: 250.0000 mL | INTRAVENOUS | Status: DC | PRN

## 2017-11-07 NOTE — Progress Notes (Addendum)
Family Medicine Teaching Service Consult Note Intern Pager: [phone]  Patient name: [name] Hernan Sr. Medical record number: [id number] Date of birth: 04/16/1976 Age: 42 y.o. Gender: male  Primary Care Provider: Patient, [name] Per Consultants: Cardiology Code Status: Full   Pt Overview and Major Events to Date:   Assessment and Plan: [name] Falzone Sr. is a 42 y.o. male presenting with chest pain . PMH is significant for T2DM, CAD, HTN, S/P unilateral BKA, ESRD, HrEF, PAD   CAD s/p CABG x3:  - Per primary Team  T2DM- A1C 12.1. Sliding Scale in Nursing Home -sSSI w/ cbgs- 148, 179 -Lantus 7u nightly   Chronic HFrEF, grade 2DD.  ECHO EF 50-55% with G1DD.   - Per primary team  HTN:  -per primary team  PVD- s/p left BKA -per primary team  ESRD: on HD MWF. HD  - nephro following for HD, appreciate assistance   Chronic pain: -home robaxin  Tobacco Abuse  - Nictonine patch  Mood disorder -home lamictal 150  FEN/GI: renal/carb modified diet  Prophylaxis: heparin drip  Disposition: per primary team  Subjective:  Patient just walked the unit and has no complaints. Says his chest hurts a little where he had surgery.   Objective: Temp:  [98.2 F (36.8 C)-99.1 F (37.3 C)] 99.1 F (37.3 C) (03/25 0752) Pulse Rate:  [83-107] 95 (03/25 0900) Resp:  [6-29] 17 (03/25 0900) BP: (91-140)/(44-95) 118/76 (03/25 0900) SpO2:  [90 %-100 %] 96 % (03/25 0900) Arterial Line BP: (148)/(63) 148/63 (03/24 1100) Weight:  [173 lb 1 oz (78.5 kg)-177 lb 7.5 oz (80.5 kg)] 173 lb 1 oz (78.5 kg) (03/25 0305) Physical Exam: General: NAD, pleasant Eyes: PERRL, EOMI, no conjunctival pallor or injection ENTM: Moist mucous membranes Cardiovascular: no LE edema Respiratory: normal work of breathing MSK: s/p L BKA Derm: no rashes appreciated Neuro: CN II-XII grossly intact Psych: Appropriate affect  Laboratory: Recent Labs  Lab 11/05/17 0412 11/05/17 1851  11/06/17 0356 11/07/17 0734  WBC 14.6*  --  11.8* 11.0*  HGB 10.7* 10.5* 9.0* 9.3*  HCT 33.1* 31.0* 27.8* 28.6*  PLT 250  --  212 273   Recent Labs  Lab 11/03/17 2343  11/05/17 0412 11/05/17 1851 11/06/17 0356 11/07/17 0734  NA 132*   < > 133* 134* 133* 133*  K 3.9   < > 5.7* 4.1 4.9 4.1  CL 91*   < > 99* 95* 95* 93*  CO2 26   < > 19*  --  25 25  BUN 33*   < > 52* 28* 33* 32*  CREATININE 7.85*   < > 9.90* 6.40* 7.99* 7.66*  CALCIUM 8.6*   < > 8.2*  --  8.6* 8.2*  PROT 8.1  --   --   --   --   --   BILITOT 1.0  --   --   --   --   --   ALKPHOS 89  --   --   --   --   --   ALT 39  --   --   --   --   --   AST 35  --   --   --   --   --   GLUCOSE 317*   < > 122* 115* 123* 176*   < > = values in this interval not displayed.   Boris Engelmann, [place], DO 11/07/2017, 10:26 AM PGY-1 [facility] Intern pager: [phone], text pages welcome

## 2017-11-07 NOTE — Progress Notes (Signed)
TCTS  Had stable day nsr Waiting for stepdown bed

## 2017-11-07 NOTE — Progress Notes (Signed)
CSW continuing to follow and assist with return to [facility] when stable  Jorge Ny, [facility] Social Worker [phone]

## 2017-11-07 NOTE — Progress Notes (Signed)
3 Days Post-Op Procedure(s) (LRB): CORONARY ARTERY BYPASS GRAFTING (CABG) x three, using left internal mammary artery and right    leg greater saphenous vein (N/A) TRANSESOPHAGEAL ECHOCARDIOGRAM (TEE) (N/A) Subjective: Some incisional pain o/w feels well Ambulating with prosthesis  Objective: Vital signs in last 24 hours: Temp:  [98.2 F (36.8 C)-98.8 F (37.1 C)] 98.4 F (36.9 C) (03/25 0305) Pulse Rate:  [83-107] 104 (03/25 0700) Cardiac Rhythm: Normal sinus rhythm (03/25 0330) Resp:  [6-28] 24 (03/25 0700) BP: (91-142)/(44-95) 118/68 (03/25 0700) SpO2:  [83 %-100 %] 93 % (03/25 0700) Arterial Line BP: (103-148)/(53-63) 148/63 (03/24 1100) Weight:  [173 lb 1 oz (78.5 kg)-177 lb 7.5 oz (80.5 kg)] 173 lb 1 oz (78.5 kg) (03/25 0305)  Hemodynamic parameters for last 24 hours:    Intake/Output from previous day: 03/24 0701 - 03/25 0700 In: 960 [P.O.:960] Out: 2000  Intake/Output this shift: No intake/output data recorded.  General appearance: alert, cooperative and no distress Neurologic: intact Heart: regular rate and rhythm Lungs: clear to auscultation bilaterally Abdomen: normal findings: soft, non-tender Wound: clean and dry  Lab Results: Recent Labs    11/05/17 0412 11/05/17 1851 11/06/17 0356  WBC 14.6*  --  11.8*  HGB 10.7* 10.5* 9.0*  HCT 33.1* 31.0* 27.8*  PLT 250  --  212   BMET:  Recent Labs    11/05/17 0412 11/05/17 1851 11/06/17 0356  NA 133* 134* 133*  K 5.7* 4.1 4.9  CL 99* 95* 95*  CO2 19*  --  25  GLUCOSE 122* 115* 123*  BUN 52* 28* 33*  CREATININE 9.90* 6.40* 7.99*  CALCIUM 8.2*  --  8.6*    PT/INR:  Recent Labs    11/04/17 1450  LABPROT 15.6*  INR 1.25   ABG    Component Value Date/Time   PHART 7.342 (L) 11/04/2017 1946   HCO3 22.3 11/04/2017 1946   TCO2 26 11/05/2017 1851   ACIDBASEDEF 3.0 (H) 11/04/2017 1946   O2SAT 99.0 11/04/2017 1946   CBG (last 3)  Recent Labs    11/06/17 1223 11/06/17 1712 11/06/17 2115   GLUCAP 151* 173* 148*    Assessment/Plan: S/P Procedure(s) (LRB): CORONARY ARTERY BYPASS GRAFTING (CABG) x three, using left internal mammary artery and right    leg greater saphenous vein (N/A) TRANSESOPHAGEAL ECHOCARDIOGRAM (TEE) (N/A) Plan for transfer to step-down: see transfer orders  Doing well CV- in SR will restart plavix and coreg  RESP_ continue IS  RENAL- ESRD. HD per Nephrology  ENDO- CBG mildly elevated. Restart nightly low dose lantus  Anemia secondary to ABL- follow  Continue cardiac rehab   LOS: 10 days    [name] 11/07/2017

## 2017-11-07 NOTE — Evaluation (Signed)
Occupational Therapy Evaluation Patient Details Name: [name] Kalla Sr. MRN: [id number] DOB: June 26, 1976 Today's Date: 11/07/2017    History of Present Illness Pt is a 42 y.o. male who presented to the ED from [place] with unstable angina with heart cath revealing multivessel CAD. Now s/p CABG x3 3/ using L internal mammary artery and R leg greater saphenous vein. Pt has a history of L BKA, R great toe amputation, CAD, ESRD, GERD, heart murmur, hypertension, type II diabetes mellitus, HrEF, and PAD.   Clinical Impression   PTA, pt was at [facility]. Pt reports participating with rehabilitation services there but per chart has been there as a resident since August. Pt reports ambulating with modified independence using LLE prosthetic limb and completing ADL without assistance while at SNF. Pt presented to OT session with flat affect but was willing to participate with ADL and functional mobility tasks. He currently requires mod assist for LB ADL including donning prosthetic, mod assist for toileting hygiene, and min guard assist for toilet transfers with Harmon Pier walker while adhering to sternal precautions. Initiated education concerning sternal precautions related to ADL participation with handout provided. Pt would benefit from continued OT services while admitted to improve independence and safety with ADL and functional mobility. Recommend continued rehabilitation services at SNF level post-acute D/C to maximize return to PLOF. OT will continue to follow while admitted.     Follow Up Recommendations  SNF;Supervision/Assistance - 24 hour    Equipment Recommendations  None recommended by OT(defer to next venue of care)    Recommendations for Other Services       Precautions / Restrictions Precautions Precautions: Fall;Sternal Precaution Booklet Issued: Yes (comment) Precaution Comments: Reviewed sternal precautions related to functional mobility and ADL participation with handout  provided.  Restrictions Weight Bearing Restrictions: Yes Other Position/Activity Restrictions: Sternal Precautions - handout given      Mobility Bed Mobility Overal bed mobility: Needs Assistance Bed Mobility: Rolling;Sidelying to Sit;Sit to Sidelying Rolling: Modified independent (Device/Increase time) Sidelying to sit: Supervision     Sit to sidelying: Supervision General bed mobility comments: VC's for maintenance of sternal precautions and log roll technique with heart pillow.   Transfers Overall transfer level: Needs assistance Equipment used: Harmon Pier walker) Transfers: Sit to/from Stand Sit to Stand: Supervision;Min guard         General transfer comment: VC's for proper hand placement on knees if he feels he has to push to stand, but was able to complete without UE support. Min guard at end of stand for balance without UE use.     Balance Overall balance assessment: Mild deficits observed, not formally tested Sitting-balance support: Feet supported;No upper extremity supported Sitting balance-Leahy Scale: Good     Standing balance support: No upper extremity supported Standing balance-Leahy Scale: Fair Standing balance comment: Pt was able to stand EOB without UE support                            ADL either performed or assessed with clinical judgement   ADL Overall ADL's : Needs assistance/impaired Eating/Feeding: Set up;Sitting   Grooming: Set up;Sitting   Upper Body Bathing: Minimal assistance;Sitting   Lower Body Bathing: Moderate assistance;Sit to/from stand   Upper Body Dressing : Minimal assistance;Sitting   Lower Body Dressing: Moderate assistance;Sit to/from stand Lower Body Dressing Details (indicate cue type and reason): Assist to don prosthesis while adhering to sternal precautions.  Toilet Transfer: Min guard;Ambulation(Eva walker; cues for  sternal precautions)   Toileting- Clothing Manipulation and Hygiene: Moderate assistance;Sit  to/from stand Toileting - Clothing Manipulation Details (indicate cue type and reason): Mod assist due to sternal precautions     Functional mobility during ADLs: Min guard;Rolling walker General ADL Comments: Pt able to ambulate in hallway with Harmon Pier walker and min guard assist.      Vision Patient Visual Report: No change from baseline Vision Assessment?: No apparent visual deficits     Perception     Praxis      Pertinent Vitals/Pain Pain Assessment: Faces Faces Pain Scale: Hurts even more Pain Location: Incision sites - sternum and LE Pain Descriptors / Indicators: Operative site guarding;Discomfort Pain Intervention(s): Monitored during session;Limited activity within patient's tolerance;Repositioned     Hand Dominance     Extremity/Trunk Assessment Upper Extremity Assessment Upper Extremity Assessment: Overall WFL for tasks assessed   Lower Extremity Assessment Lower Extremity Assessment: LLE deficits/detail;RLE deficits/detail RLE Deficits / Details: Prior great toe and 2nd toe amputations LLE Deficits / Details: Decreased weight shift to this side due to pain from incision site. Prior BKA with prosthetic.    Cervical / Trunk Assessment Cervical / Trunk Assessment: Normal   Communication Communication Communication: No difficulties   Cognition Arousal/Alertness: Awake/alert Behavior During Therapy: Flat affect Overall Cognitive Status: No family/caregiver present to determine baseline cognitive functioning                                 General Comments: Follows commands well. Demonstrates slow processing at times and flat affect. Question accuracy of pt's report of PLOF.    General Comments       Exercises     Shoulder Instructions      Home Living Family/patient expects to be discharged to:: Skilled nursing facility                                 Additional Comments: Pt states the plan is to get back home with his  girlfriend and sons after another rehab stay at [place]      Prior Functioning/Environment Level of Independence: Independent        Comments: Pt states he was not using a walker and was independent with ADL's. Question accuracy of this information - if he was truly independent unsure why he would still be at SNF        OT Problem List: Decreased strength;Decreased range of motion;Decreased activity tolerance;Impaired balance (sitting and/or standing);Decreased safety awareness;Decreased knowledge of use of DME or AE;Decreased knowledge of precautions;Pain;Cardiopulmonary status limiting activity;Decreased cognition      OT Treatment/Interventions: Self-care/ADL training;Therapeutic exercise;Energy conservation;DME and/or AE instruction;Therapeutic activities;Cognitive remediation/compensation;Patient/family education;Balance training    OT Goals(Current goals can be found in the care plan section) Acute Rehab OT Goals Patient Stated Goal: to go home after rehab OT Goal Formulation: With patient Time For Goal Achievement: 11/21/17 Potential to Achieve Goals: Good ADL Goals Pt Will Perform Grooming: with modified independence;standing Pt Will Perform Lower Body Dressing: with supervision;sit to/from stand(donning prosthetic adhering to sternal precautions) Pt Will Transfer to Toilet: with modified independence;ambulating;bedside commode Pt Will Perform Toileting - Clothing Manipulation and hygiene: with modified independence;sit to/from stand(adhering to sternal precautions) Additional ADL Goal #1: Pt will independently verbalize sternal precautions related to ADL participation.  OT Frequency: Min 2X/week   Barriers to D/C:  Co-evaluation PT/OT/SLP Co-Evaluation/Treatment: Yes Reason for Co-Treatment: For patient/therapist safety;Complexity of the patient's impairments (multi-system involvement);To address functional/ADL transfers PT goals addressed during session:  Mobility/safety with mobility;Balance;Proper use of DME OT goals addressed during session: ADL's and self-care;Strengthening/ROM      AM-PAC PT "6 Clicks" Daily Activity     Outcome Measure Help from another person eating meals?: A Little Help from another person taking care of personal grooming?: A Little Help from another person toileting, which includes using toliet, bedpan, or urinal?: A Lot Help from another person bathing (including washing, rinsing, drying)?: A Lot Help from another person to put on and taking off regular upper body clothing?: A Little Help from another person to put on and taking off regular lower body clothing?: A Lot 6 Click Score: 15   End of Session Equipment Utilized During Treatment: Harmon Pier walker; L prosthetic leg) Nurse Communication: Mobility status  Activity Tolerance: Patient tolerated treatment well Patient left: in bed  OT Visit Diagnosis: Other abnormalities of gait and mobility (R26.89)                Time: 8185-6314 OT Time Calculation (min): 28 min Charges:  OT General Charges $OT Visit: 1 Visit OT Evaluation $OT Eval Moderate Complexity: 1 Mod OT Treatments $Self Care/Home Management : 8-22 mins G-Codes:     Norman Herrlich, MS OTR/L  Pager: [place] A Dakiyah Heinke 11/07/2017, 9:43 AM

## 2017-11-07 NOTE — Progress Notes (Signed)
[facility] Progress Note   Dialysis Orders: [facility] MWF 4h  F200  500/800  73kg   2/2 bath  LUE AVF  Hep 2400 -Calcitriol 1.25 mcg PO TIWPTH 275 -Venofer 50 mg IV weekly (last dose 10/26/17)  Assessment: 1. Chest pain/ severe 3v CAD: now sp CABG 3.22.19 2. ESRD -MWF HD usual schedule. Had short HD Sat w/ 2 L off, HD 2.5 hrs wee hrs of this AM.  Will plan next session for Tuesday 3/26. 3. Hypertension/volume- vol a little better, continue to chip away at it 4. Anemia of ckd - HGB 14.2No ESA. 5. Metabolic bone disease -Ca 9 P 6Contiue VDRA/change to non Ca based binders only - prev on fosrenol/ca acetate - if P remains un - will increase Fosrenol to 2 ac. 6. Nutrition -Albumin 3.5Renal/Carb Mod diet, renal vit, nepro 7. DM per primary 8. Disp - patient living at [place] since last August 9. 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    Madelon Lips MD [facility] pgr [phone] 11/07/2017, 9:46 AM    Subjective:   Had HD early this AM from 1-330 am.  Tired this AM.  Objective Vitals:   11/07/17 0600 11/07/17 0700 11/07/17 0752 11/07/17 0800  BP: 139/72 118/68  114/75  Pulse: (!) 101 (!) 104  (!) 106  Resp: 14 (!) 24  (!) 29  Temp:   99.1 F (37.3 C)   TempSrc:   Oral   SpO2: 94% 93%  94%  Weight:      Height:       Physical Exam General: slender male NAD Heart: RRR, sternotomy dressing in place Lungs: no rales Abdomen: soft NT Extremities: left BKA mild LE edema Dialysis Access: left AVF + bruit  Additional Objective Labs: Basic Metabolic Panel: Recent Labs  Lab 11/02/17 0326 11/03/17 0412  11/04/17 0409  11/05/17 0412 11/05/17 1851 11/06/17 0356 11/07/17 0734  NA 131* 130*   < > 132*   < > 133* 134* 133* 133*  K 4.4 4.8   < > 4.3   < > 5.7* 4.1 4.9 4.1  CL 91* 87*   < > 91*   < > 99* 95* 95* 93*  CO2 23 24   < > 25  --  19*  --  25 25  GLUCOSE 140* 130*   < > 256*   < > 122* 115* 123* 176*  BUN 63* 90*   < > 44*   < >  52* 28* 33* 32*  CREATININE 10.88* 13.42*   < > 8.83*   < > 9.90* 6.40* 7.99* 7.66*  CALCIUM 9.4 8.9   < > 8.8*  --  8.2*  --  8.6* 8.2*  PHOS 7.1* 6.5*  --  4.5  --   --   --   --   --    < > = values in this interval not displayed.   Liver Function Tests: Recent Labs  Lab 11/03/17 0412 11/03/17 2343 11/04/17 0409  AST  --  35  --   ALT  --  39  --   ALKPHOS  --  89  --   BILITOT  --  1.0  --   PROT  --  8.1  --   ALBUMIN 3.5 3.8 3.5   No results for input(s): LIPASE, AMYLASE in the last 168 hours. CBC: Recent Labs  Lab 11/04/17 1450  11/04/17 2030  11/05/17 0412 11/05/17 1851 11/06/17 0356 11/07/17 0734  WBC 18.6*  --  17.0*  --  14.6*  --  11.8* 11.0*  HGB 11.5*   < > 11.7*   < > 10.7* 10.5* 9.0* 9.3*  HCT 34.6*   < > 35.7*   < > 33.1* 31.0* 27.8* 28.6*  MCV 88.0  --  88.8  --  88.3  --  88.8 89.1  PLT 237  --  263  --  250  --  212 273   < > = values in this interval not displayed.   Blood Culture    Component Value Date/Time   SDES BLOOD RIGHT HAND 03/07/2017 2304   SPECREQUEST  03/07/2017 2304    BOTTLES DRAWN AEROBIC AND ANAEROBIC Blood Culture adequate volume   CULT NO GROWTH 5 DAYS 03/07/2017 2304   REPTSTATUS 03/13/2017 FINAL 03/07/2017 2304    Cardiac Enzymes: No results for input(s): CKTOTAL, CKMB, CKMBINDEX, TROPONINI in the last 168 hours. CBG: Recent Labs  Lab 11/06/17 0844 11/06/17 1223 11/06/17 1712 11/06/17 2115 11/07/17 0749  GLUCAP 121* 151* 173* 148* 179*   Iron Studies: No results for input(s): IRON, TIBC, TRANSFERRIN, FERRITIN in the last 72 hours. Lab Results  Component Value Date   INR 1.25 11/04/2017   INR 1.00 10/30/2017   INR 1.05 09/13/2014   Studies/Results: Dg Chest 2 View  Result Date: 11/07/2017 CLINICAL DATA:  Status post coronary bypass grafting EXAM: CHEST - 2 VIEW COMPARISON:  11/06/2017 FINDINGS: Cardiac shadow is mildly prominent. Postsurgical changes are again seen. Mediastinal drain and left thoracostomy  catheter have been removed in the interval. No pneumothorax is seen. The lungs are well aerated with small pleural effusions posteriorly. No bony abnormality is seen. IMPRESSION: No pneumothorax following tube removal. Tiny small pleural effusions. Electronically Signed   By: Inez Catalina M.D.   On: 11/07/2017 08:11   Dg Chest Port 1 View  Result Date: 11/06/2017 CLINICAL DATA:  Atelectasis, history coronary artery disease, CHF, end-stage renal disease, hypertension, type II diabetes mellitus EXAM: PORTABLE CHEST 1 VIEW COMPARISON:  Portable exam 0433 hours compared to 11/05/2017 FINDINGS: Mediastinal drain and LEFT thoracostomy tube again seen. Interval removal of Swan-Ganz catheter with persistent visualization of LEFT jugular catheter. Epicardial pacing wires noted. Enlargement of cardiac silhouette post CABG. Pulmonary vascular congestion. Probable atelectasis in LEFT lower lobe. Remaining lungs clear. No gross pleural effusion or pneumothorax. IMPRESSION: Enlargement of cardiac silhouette with pulmonary vascular congestion post CABG. Probable atelectasis in LEFT lower lobe. Electronically Signed   By: Lavonia Dana M.D.   On: 11/06/2017 07:25   Medications: . sodium chloride    . sodium chloride    . lactated ringers Stopped (11/04/17 1637)   . acetaminophen  1,000 mg Oral Q6H  . aspirin EC  325 mg Oral Daily  . atorvastatin  80 mg Oral q1800  . bisacodyl  10 mg Oral Daily   Or  . bisacodyl  10 mg Rectal Daily  . calcitRIOL  1.25 mcg Oral Q M,W,F-HD  . carvedilol  6.25 mg Oral BID WC  . chlorhexidine  15 mL Mouth Rinse BID  . clopidogrel  75 mg Oral Daily  . docusate sodium  200 mg Oral Daily  . enoxaparin (LOVENOX) injection  30 mg Subcutaneous Q48H  . insulin aspart  0-9 Units Subcutaneous TID WC  . insulin glargine  7 Units Subcutaneous QHS  . lamoTRIgine  150 mg Oral QHS  . lanthanum  1,000 mg Oral TID WC  . mouth rinse  15 mL Mouth Rinse q12n4p  . pantoprazole  40 mg Oral Daily   . ramelteon  8 mg Oral QHS  . sodium chloride flush  3 mL Intravenous Q12H  . sodium chloride flush  3 mL Intravenous Q12H

## 2017-11-07 NOTE — Care Management Note (Signed)
Case Management Note Previous CM note completed by Bethena Roys, RN 11/03/2017, 2:59 PM   Patient Details  Name: [name] Labella Sr. MRN: [id number] Date of Birth: Jan 13, 1976  Subjective/Objective:  Pt presented for Chest Pain-s/p cath 10-31-17  Revealed Multivessel CAD- Plan for CABG 11-04-17. PTA from [facility]-plan will be to return post procedure.                  Action/Plan: CSW and CM following for additional needs.   Expected Discharge Date:                  Expected Discharge Plan:  Skilled Nursing Facility([facility] [phone])  In-House Referral:  Clinical Social Work  Discharge planning Services  CM Consult  Post Acute Care Choice:  NA Choice offered to:  NA  DME Arranged:  N/A DME Agency:  NA  HH Arranged:  NA HH Agency:  NA  Status of Service:  Completed, signed off  If discussed at [facility] of Stay Meetings, dates discussed:  11-03-17, 11/08/17   Discharge Disposition: skilled facility   Additional Comments:  11/07/17-  1140 - [name] Egle RN, CM- pt s/p CABGx3 on 11/04/17-  Progressing with ambulation- per MD note today- will tx out of ICU- transition plan to return to [facility] when medically stable.   [name] Gibbons [place], RN 11/07/2017, 11:39 AM [phone] 4E Transition Care Coordinator-- 2H coverage

## 2017-11-07 NOTE — Progress Notes (Signed)
Physical Therapy Evaluation  Assessment: Pt admitted with above diagnosis. Pt currently with functional limitations due to the deficits listed below (see PT Problem List). At the time of PT eval pt was able to perform transfers and ambulation with gross min guard assist to supervision for safety. VC's provided throughout session for proper maintenance of sternal precautions. PTA pt at SNF and desires to return to SNF before d/c'ing home. Overall, pt is mobilizing well with the Harmon Pier walker and his prosthetic donned (requires assist getting prosthetic donned/doffed), and would benefit from cardiac rehab as well as continued ambulation with nursing staff. Acutely, pt will benefit from skilled PT to increase their independence and safety with mobility to allow discharge to the venue listed below.       11/07/17 0811  PT Visit Information  Last PT Received On 11/07/17  Assistance Needed +1  PT/OT/SLP Co-Evaluation/Treatment Yes  Reason for Co-Treatment Complexity of the patient's impairments (multi-system involvement);For patient/therapist safety;To address functional/ADL transfers  PT goals addressed during session Mobility/safety with mobility;Balance;Proper use of DME  History of Present Illness Pt is a 42 y/o male who presents s/p CABG x3 on 11/04/17. PMH significant for L BKA (revision on 01/11/17), R great toe and 2nd toe amputations, DMII, HTN, ESRD on HD MWF, CAD.   Precautions  Precautions Fall  Precaution Comments L BKA with prosthesis in room  Restrictions  Weight Bearing Restrictions Yes  Other Position/Activity Restrictions Sternal Precautions - handout given  Home Living  Family/patient expects to be discharged to: Skilled nursing facility  Additional Comments Pt states the plan is to get back home with his girlfriend and sons after another rehab stay at [place]  Prior Function  Level of Independence Independent  Comments Pt states he was not using a walker and was independent with  ADL's. [name]  Communication  Communication No difficulties  Pain Assessment  Pain Assessment Faces  Faces Pain Scale 6  Pain Location Incision sites - sternum and LE  Pain Descriptors / Indicators Operative site guarding;Discomfort  Pain Intervention(s) Limited activity within patient's tolerance;Monitored during session;Repositioned  Cognition  Arousal/Alertness Awake/alert  Behavior During Therapy WFL for tasks assessed/performed  Overall Cognitive Status No family/caregiver present to determine baseline cognitive functioning  Upper Extremity Assessment  Upper Extremity Assessment Defer to OT evaluation  Lower Extremity Assessment  Lower Extremity Assessment LLE deficits/detail;RLE deficits/detail  RLE Deficits / Details Prior great toe and 2nd toe amputations  LLE Deficits / Details Decreased weight shift to this side due to pain from incision site. Prior BKA with prosthetic.   Cervical / Trunk Assessment  Cervical / Trunk Assessment Normal  Bed Mobility  Overal bed mobility Needs Assistance  Bed Mobility Rolling;Sidelying to Sit;Sit to Sidelying  Rolling Modified independent (Device/Increase time)  Sidelying to sit Supervision  Sit to sidelying Supervision  General bed mobility comments VC's for maintenance of sternal precautions and log roll technique with heart pillow.   Transfers  Overall transfer level Needs assistance  Equipment used  Harmon Pier walker)  Transfers Sit to/from Stand  Sit to Stand Supervision;Min guard  General transfer comment VC's for proper hand placement on knees if he feels he has to push to stand, but was able to complete without UE support. Min guard at end of stand for balance without UE use.   Ambulation/Gait  Ambulation/Gait assistance Supervision  Ambulation Distance (Feet) 370 Feet  Assistive device  Rolling walker (2 wheeled) (Eva walker)  Gait  Pattern/deviations Step-through pattern;Decreased stride length;Decreased weight shift to left;Trunk flexed  General Gait Details VC's for improved posture, minimizing weight through UE's on walker, and general safety. Pt with decreased weight shift to the L and reports pain from inicision site.   Gait velocity Decreased  Gait velocity interpretation Below normal speed for age/gender  Balance  Overall balance assessment Needs assistance  Sitting-balance support Feet supported;No upper extremity supported  Sitting balance-Leahy Scale Good  Standing balance support No upper extremity supported  Standing balance-Leahy Scale Fair  Standing balance comment Pt was able to stand EOB without UE support   PT - End of Session  Equipment Utilized During Treatment Gait belt  Activity Tolerance Patient tolerated treatment well  Patient left in bed;with call bell/phone within reach  Nurse Communication Mobility status  PT Assessment  PT Recommendation/Assessment Patient needs continued PT services  PT Visit Diagnosis Pain;Difficulty in walking, not elsewhere classified (R26.2)  Pain - Right/Left Left (central - sternum)  Pain - part of body Leg  PT Problem List Decreased strength;Decreased range of motion;Decreased activity tolerance;Decreased balance;Decreased mobility;Decreased knowledge of use of DME;Decreased knowledge of precautions;Decreased safety awareness;Pain;Cardiopulmonary status limiting activity  PT Plan  PT Frequency (ACUTE ONLY) Min 2X/week  PT Treatment/Interventions (ACUTE ONLY) DME instruction;Gait training;Stair training;Functional mobility training;Therapeutic exercise;Therapeutic activities;Neuromuscular re-education;Patient/family education  AM-PAC PT "6 Clicks" Daily Activity Outcome Measure  Difficulty turning over in bed (including adjusting bedclothes, sheets and blankets)? 3  Difficulty moving from lying on back to sitting on the side of the bed?  3  Difficulty sitting down  on and standing up from a chair with arms (e.g., wheelchair, bedside commode, etc,.)? 3  Help needed moving to and from a bed to chair (including a wheelchair)? 3  Help needed walking in hospital room? 3  Help needed climbing 3-5 steps with a railing?  2  6 Click Score 17  Mobility G Code  CK  PT Recommendation  Follow Up Recommendations SNF;Supervision/Assistance - 24 hour  PT equipment None recommended by PT (TBD by next venue of care)  Individuals Consulted  Consulted and Agree with Results and Recommendations Patient  Acute Rehab PT Goals  Patient Stated Goal Home after rehab  PT Goal Formulation With patient  Time For Goal Achievement 11/21/17  Potential to Achieve Goals Good  PT Time Calculation  PT Start Time (ACUTE ONLY) 0806  PT Stop Time (ACUTE ONLY) 0835  PT Time Calculation (min) (ACUTE ONLY) 29 min  PT General Charges  $$ ACUTE PT VISIT 1 Visit  PT Evaluation  $PT Eval Moderate Complexity 1 Mod    Rolinda Roan, PT, DPT Acute Rehabilitation Services Pager: [phone]

## 2017-11-07 NOTE — Progress Notes (Signed)
Nutrition Follow-up  DOCUMENTATION CODES:   Not applicable  INTERVENTION:   -Re-order Nepro Shake po BID, each supplement provides 425 kcal and 19 grams protein  -Bed time snack via Renal Diet order  -Recommend Renal MVI  NUTRITION DIAGNOSIS:   Increased nutrient needs related to chronic illness(ESRD on HD) as evidenced by estimated needs.  Being addressed via supplementation  GOAL:   Patient will meet greater than or equal to 90% of their needs  Progressing  MONITOR:   PO intake, Supplement acceptance, Labs, I & O's  REASON FOR ASSESSMENT:   Malnutrition Screening Tool    ASSESSMENT:   42 y.o. male presenting with chest pain . PMH is significant for T2DM, CAD, HTN, S/P unilateral BKA, ESRD, HrEF, PAD. Pt presented from SNF with NSTEMI and left heart cath and coronary angiography.   3/22 CABG x 3, extubated post procedure  HD on 3/23 with net UF 2 L. Next HD 3/26 EDW 73 kg, current wt 78.5 kg. Weight trending down post procedure. Weighed 82.5 kg on 3/23  Recorded po intake 50-100% of meals; mostly eating 90-100%. Pt reports appetite is fair. Reports eating at least 50% of meal trays. Pt reports he likes Nepro but has not been receiving lately. Noted order for Nepro was discontinued prior to surgery on 3/22 and not re-ordered when diet was advanced.  Labs: phosphorus 4.5 on 3/22 (wdl) Meds: fosrenol, ss novolog, lantus, calcitriol   Diet Order:  Diet renal/carb modified with fluid restriction Diet-HS Snack? Nothing; Fluid restriction: 1200 mL Fluid; Room service appropriate? Yes; Fluid consistency: Thin  EDUCATION NEEDS:   Education needs have been addressed  Skin:  Skin Assessment: Skin Integrity Issues: Skin Integrity Issues:: Incisions Incisions: sternum, leg, foot Other: dehisced R foot wound (prior toe amputation)  Last BM:  3/24  Height:   Ht Readings from Last 1 Encounters:  11/04/17 6\' 2"  (1.88 m)    Weight:   Wt Readings from Last 1  Encounters:  11/07/17 173 lb 1 oz (78.5 kg)    Ideal Body Weight:  80.75 kg(adjusted for L bka)  BMI:  Body mass index is 22.22 kg/m.  Estimated Nutritional Needs:   Kcal:  2200-2500  Protein:  110-125 g  Fluid:  1000 mL plus UOP   [employer]ner MS, RD, LDN, CNSC [phone] Pager  [phone] Weekend/On-Call Pager

## 2017-11-08 LAB — GLUCOSE, CAPILLARY
GLUCOSE-CAPILLARY: 174 mg/dL — AB (ref 65–99)
GLUCOSE-CAPILLARY: 206 mg/dL — AB (ref 65–99)
Glucose-Capillary: 155 mg/dL — ABNORMAL HIGH (ref 65–99)
Glucose-Capillary: 168 mg/dL — ABNORMAL HIGH (ref 65–99)
Glucose-Capillary: 194 mg/dL — ABNORMAL HIGH (ref 65–99)

## 2017-11-08 LAB — CBC
HEMATOCRIT: 28.6 % — AB (ref 39.0–52.0)
HEMOGLOBIN: 9.2 g/dL — AB (ref 13.0–17.0)
MCH: 28.3 pg (ref 26.0–34.0)
MCHC: 32.2 g/dL (ref 30.0–36.0)
MCV: 88 fL (ref 78.0–100.0)
Platelets: 327 10*3/uL (ref 150–400)
RBC: 3.25 MIL/uL — AB (ref 4.22–5.81)
RDW: 15.3 % (ref 11.5–15.5)
WBC: 9.1 10*3/uL (ref 4.0–10.5)

## 2017-11-08 LAB — BASIC METABOLIC PANEL
Anion gap: 14 (ref 5–15)
BUN: 49 mg/dL — AB (ref 6–20)
CHLORIDE: 92 mmol/L — AB (ref 101–111)
CO2: 25 mmol/L (ref 22–32)
Calcium: 8.4 mg/dL — ABNORMAL LOW (ref 8.9–10.3)
Creatinine, Ser: 9.7 mg/dL — ABNORMAL HIGH (ref 0.61–1.24)
GFR calc non Af Amer: 6 mL/min — ABNORMAL LOW (ref >60)
GFR, EST AFRICAN AMERICAN: 7 mL/min — AB (ref >60)
Glucose, Bld: 213 mg/dL — ABNORMAL HIGH (ref 65–99)
POTASSIUM: 4.2 mmol/L (ref 3.5–5.1)
SODIUM: 131 mmol/L — AB (ref 135–145)

## 2017-11-08 LAB — PHOSPHORUS: PHOSPHORUS: 5.6 mg/dL — AB (ref 2.5–4.6)

## 2017-11-08 MED ORDER — CALCITRIOL 0.25 MCG PO CAPS
ORAL_CAPSULE | ORAL | Status: AC
  Administered 2017-11-08: 1.25 ug via ORAL
  Filled 2017-11-08: qty 1

## 2017-11-08 MED ORDER — DARBEPOETIN ALFA 60 MCG/0.3ML IJ SOSY
PREFILLED_SYRINGE | INTRAMUSCULAR | Status: AC
  Administered 2017-11-08: 60 ug via INTRAVENOUS
  Filled 2017-11-08: qty 0.3

## 2017-11-08 MED ORDER — CALCITRIOL 0.5 MCG PO CAPS
ORAL_CAPSULE | ORAL | Status: AC
  Filled 2017-11-08: qty 2

## 2017-11-08 MED ORDER — DARBEPOETIN ALFA 60 MCG/0.3ML IJ SOSY
60.0000 ug | PREFILLED_SYRINGE | INTRAMUSCULAR | Status: DC
  Administered 2017-11-08: 60 ug via INTRAVENOUS

## 2017-11-08 MED ORDER — INSULIN GLARGINE 100 UNIT/ML ~~LOC~~ SOLN
8.0000 [IU] | Freq: Every day | SUBCUTANEOUS | Status: DC
  Administered 2017-11-08: 8 [IU] via SUBCUTANEOUS
  Filled 2017-11-08: qty 0.08

## 2017-11-08 NOTE — Progress Notes (Signed)
4 Days Post-Op Procedure(s) (LRB): CORONARY ARTERY BYPASS GRAFTING (CABG) x three, using left internal mammary artery and right    leg greater saphenous vein (N/A) TRANSESOPHAGEAL ECHOCARDIOGRAM (TEE) (N/A) Subjective: No complaints this AM Ambulating with prosthesis  Objective: Vital signs in last 24 hours: Temp:  [98.3 F (36.8 C)-98.7 F (37.1 C)] 98.6 F (37 C) (03/26 0447) Pulse Rate:  [86-102] 93 (03/26 0447) Cardiac Rhythm: Normal sinus rhythm (03/25 2323) Resp:  [5-24] 18 (03/26 0447) BP: (111-135)/(69-85) 121/71 (03/26 0447) SpO2:  [93 %-96 %] 96 % (03/26 0447) Weight:  [178 lb 9.2 oz (81 kg)] 178 lb 9.2 oz (81 kg) (03/26 0447)  Hemodynamic parameters for last 24 hours:    Intake/Output from previous day: 03/25 0701 - 03/26 0700 In: 240 [P.O.:240] Out: -  Intake/Output this shift: No intake/output data recorded.  General appearance: alert, cooperative and no distress Neurologic: intact Heart: regular rate and rhythm Lungs: clear to auscultation bilaterally Wound: clean and dry  Lab Results: Recent Labs    11/07/17 0734 11/08/17 0329  WBC 11.0* 9.1  HGB 9.3* 9.2*  HCT 28.6* 28.6*  PLT 273 327   BMET:  Recent Labs    11/07/17 0734 11/08/17 0329  NA 133* 131*  K 4.1 4.2  CL 93* 92*  CO2 25 25  GLUCOSE 176* 213*  BUN 32* 49*  CREATININE 7.66* 9.70*  CALCIUM 8.2* 8.4*    PT/INR: No results for input(s): LABPROT, INR in the last 72 hours. ABG    Component Value Date/Time   PHART 7.342 (L) 11/04/2017 1946   HCO3 22.3 11/04/2017 1946   TCO2 26 11/05/2017 1851   ACIDBASEDEF 3.0 (H) 11/04/2017 1946   O2SAT 99.0 11/04/2017 1946   CBG (last 3)  Recent Labs    11/07/17 1147 11/07/17 1725 11/08/17 0619  GLUCAP 162* 189* 206*    Assessment/Plan: S/P Procedure(s) (LRB): CORONARY ARTERY BYPASS GRAFTING (CABG) x three, using left internal mammary artery and right    leg greater saphenous vein (N/A) TRANSESOPHAGEAL ECHOCARDIOGRAM (TEE)  (N/A) -POD # 4  Cv- stable in SR  Dc pacing wires  RESP- continue IS  RENAL- for HD today per Nephrology  ENDO- CBG mildly elevated, back on lantus  Should be ready for return to [facility] this evening or tomorrow   LOS: 11 days    Melrose Nakayama 11/08/2017

## 2017-11-08 NOTE — Progress Notes (Signed)
Came to ambulate however pt declined. Sts he doesn't feel like it. I attempted to convince him to walk but he continued to refuse. For HD later. Will reattempt tomorrow. [name] CES, ACSM 1:27 PM 11/08/2017

## 2017-11-08 NOTE — Progress Notes (Signed)
Epicardial pacing wires removed as per orders.  Pt tolerated well. Bedrest until 1207.

## 2017-11-08 NOTE — Discharge Summary (Addendum)
Physician Discharge Summary       [address]       [place] [address]             [phone]    Patient ID: [name] Isadore Sr. MRN: [id number] DOB/AGE: Jul 14, 1976 42 y.o.  Admit date: 10/28/2017 Discharge date: 11/09/2017  Admission Diagnoses: 1. ACS (acute coronary syndrome) ([place]) 2. Coronary artery disease involving native coronary artery of native heart with unstable angina pectoris [facility])  Discharge Diagnoses:  1. S/P CABG x 3 2. Anemia of chronic disease 3. History of hypertension 4. History of type II diabetes mellitus ([place]) 5. History of ESRD (end stage renal disease) on dialysis (HCC)-MWF [employer] [employer]" (03/08/2017) 6. History of chronic combined systolic and diastolic heart failure ([place]) 7. History of atherosclerosis of lower extremity ([place]) 8. History of GERD (gastroesophageal reflux disease) 9. History of PONV (postoperative nausea and vomiting)  Consults: nephrology  Procedure (s):  LEFT HEART CATH AND CORONARY ANGIOGRAPHY by Dr. Claiborne Billings on 03/182019:  Conclusion     Ost 2nd Diag to 2nd Diag lesion is 99% stenosed.  Prox LAD to Mid LAD lesion is 99% stenosed.  Ost 2nd Mrg to 2nd Mrg lesion is 80% stenosed.  Mid Cx to Dist Cx lesion is 100% stenosed.  Ost RCA to Prox RCA lesion is 20% stenosed.  Mid RCA lesion is 40% stenosed.  Dist RCA-1 lesion is 90% stenosed.  Dist RCA-2 lesion is 80% stenosed.  The left ventricular systolic function is normal.  LV end diastolic pressure is normal.   Severe multivessel CAD with coronary calcification involving all major coronary arteries.   There is 99% subtotal occlusion of a small second diagonal branch of the LAD.  The mid LAD is very tortuous and has diffuse 95% nodular very calcified stenoses in the mid segment; the circumflex vessel has 80% marginal stenosis and is occluded after this marginal takeoff with collateralization retrograde distally; and a large dominant calcified RCA with  20% proximal 40% mid, and focal 90 and 80% stenoses after the acute margin and before the PDA takeoff.  Mild LV dysfunction with an ejection fraction of 40 to less than 45% with distal anterolateral apical hypocontractility.  LVEDP 5-7 mm.  RECOMMENDATION: With the extensive coronary calcification and multivessel CAD recommend surgical consultation for CABG revascularization.    Median sternotomy, extracorporeal circulation, coronary artery bypass grafting x3 (left internal mammary artery to left anterior descending, saphenous vein graft to obtuse marginal 1, saphenous vein graft to posterior descending) by Dr. Roxan Hockey on 11/04/2017.   History of Presenting Illness: Patient is a 42 year old male with a history of end-stage renal disease on hemodialysis Monday Wednesday Friday at [facility].  He also has a history of multiple cardiovascular risk factors including diabetes mellitus type 2, hypertension, coronary artery disease, combined systolic and diastolic dysfunction and medical noncompliance.  On 10/28/2017 the patient developed an episode of midsternal chest pain that he rated 10/10 with radiation down the right arm.  It was also associated with nausea, vomiting and shortness of breath.  He presented to the emergency department and had inferolateral ST depression on EKG.  He had a troponin of 0.7 and was admitted for further evaluation and treatment to include cardiology consultation.  The peak troponin was 0.10.  He was started on a heparin drip.  He underwent cardiac catheterization on today's date and findings are listed below as he is noted to have severe multivessel coronary artery disease.  We are  asked to see the patient in cardiothoracic surgical consultation for consideration of CABG.  Nephrology is assisting with management including hemodialysis. atient seen and examined. 42 yo man with type 1 diabetes complicated by nephropathy, neuropathy and vasculopathy. Left BKA  due to PAD. 1 month history of CP, thought it was indigestion at first but Friday had a severe episode leading to admission. Troponin slightly elevated c/w non-STEMI. 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 CABG indicated for survival benefit and relief of symptoms. He is high risk for complications.  Dr. Roxan Hockey discussed the general nature of the procedure, including the need for general anesthesia, the use of cardiopulmonary bypass, and incisions to be used with [name]. We discussed the expected hospital stay, overall recovery and short and long term outcomes. Dr. Roxan Hockey  informed him of the indications, risks, benefits and alternatives. He has been on Plavix. Ideally would wait a week for plavix effect to wear off particularly in light of his renal failure. However, Dr. Roxan Hockey did not think he will be able to wait that long. For now will plan OR Friday. That will be 4 days off plavix. Bleeding risk will be improved, although still higher than normal. Pre operative carotid duplex US showed no significant internal carotid artery stenosis bilaterally. He underwent a CABG x 3 on 11/04/2017.   Brief Hospital Course:  The patient was extubated the evening of surgery without difficulty. He remained afebrile and hemodynamically stable. He was weaned off Nitroglycerin drip. Gordy Councilman, a line, chest tubes, and foley were removed early in the post operative course. Lopressor was started and titrated accordingly. He had ESRD and nephrology continued to follow him post op as well as arrange for HD. His HD days prior to admission were M/Wed/Fri. He has anemia of chronic disease. He did not require a post op transfusion. Last H and H was 9.2 and 28.6. He was weaned off the insulin drip.  Once he/she was tolerating a diet, home diabetic medicines were restarted.  The patient's glucose remained well controlled on Insulin.The patient's HGA1C pre op was 12.1. He will need to follow up with his medical doctor  after discharge. The patient was felt surgically stable for transfer from the ICU to PCTU for further convalescence on 11/07/2017. He continues to progress with cardiac rehab. He was ambulating on room air. He has been tolerating a diet and has had a bowel movement. Epicardial pacing wires were removed on 11/08/2017. Chest tube sutures will be removed the day of discharge. The patient is felt surgically stable for discharge to SNF today.   We ask the SNF to please do the following: 1. Please obtain vital signs at least one time daily 2.Please weigh the patient daily. If he or she continues to gain weight or develops lower extremity edema, contact the office at [phone]. 3. Ambulate patient at least three times daily and please use sternal precautions.  Latest Vital Signs: Blood pressure 127/72, pulse 89, temperature 98.6 F (37 C), temperature source Oral, resp. rate 17, height 6\' 2"  (1.88 m), weight 175 lb 0.7 oz (79.4 kg), SpO2 98 %.  Physical Exam: General appearance: alert, cooperative and no distress Neurologic: intact Heart: regular rate and rhythm Lungs: clear to auscultation bilaterally Wound: clean and dry  Discharge Condition:Stable and discharged to SNF.  Recent laboratory studies:  Lab Results  Component Value Date   WBC 9.1 11/08/2017   HGB 9.2 (L) 11/08/2017   HCT 28.6 (L) 11/08/2017   MCV 88.0  11/08/2017   PLT 327 11/08/2017   Lab Results  Component Value Date   NA 131 (L) 11/08/2017   K 4.2 11/08/2017   CL 92 (L) 11/08/2017   CO2 25 11/08/2017   CREATININE 9.70 (H) 11/08/2017   GLUCOSE 213 (H) 11/08/2017    Diagnostic Studies: Dg Chest 2 View  Result Date: 11/07/2017 CLINICAL DATA:  Status post coronary bypass grafting EXAM: CHEST - 2 VIEW COMPARISON:  11/06/2017 FINDINGS: Cardiac shadow is mildly prominent. Postsurgical changes are again seen. Mediastinal drain and left thoracostomy catheter have been removed in the interval. No pneumothorax is seen.  The lungs are well aerated with small pleural effusions posteriorly. No bony abnormality is seen. IMPRESSION: No pneumothorax following tube removal. Tiny small pleural effusions. Electronically Signed   By: Inez Catalina M.D.   On: 11/07/2017 08:11    Discharge Medications: Allergies as of 11/09/2017      Reactions   Coconut Oil Anaphylaxis   Can use topically, allergic to coconut foods       Medication List    STOP taking these medications   amLODipine 10 MG tablet Commonly known as:  NORVASC   feeding supplement (PRO-STAT SUGAR FREE 64) Liqd   fentaNYL 12 MCG/HR Commonly known as:  DURAGESIC - dosed mcg/hr   furosemide 20 MG tablet Commonly known as:  LASIX   nicotine 21 mg/24hr patch Commonly known as:  NICODERM CQ - dosed in mg/24 hours     TAKE these medications   acetaminophen 500 MG tablet Commonly known as:  TYLENOL Take 1,000 mg by mouth every 6 (six) hours as needed (for mild pain and CANNOT EXCEED 4,000 MG/24 HOURS).   aspirin 325 MG EC tablet Take 1 tablet (325 mg total) by mouth daily.   atorvastatin 80 MG tablet Commonly known as:  LIPITOR Take 1 tablet (80 mg total) by mouth daily at 6 PM.   BOOST GLUCOSE CONTROL Liqd Take 8 oz by mouth as directed.   calcitRIOL 0.25 MCG capsule Commonly known as:  ROCALTROL Take 5 capsules (1.25 mcg total) by mouth every Monday, Wednesday, and Friday with hemodialysis. What changed:  how much to take   calcium acetate 667 MG capsule Commonly known as:  PHOSLO Take 3 capsules (2,001 mg total) by mouth 3 (three) times daily with meals. What changed:  how much to take   carvedilol 6.25 MG tablet Commonly known as:  COREG Take 1 tablet (6.25 mg total) by mouth 2 (two) times daily with a meal.   clopidogrel 75 MG tablet Commonly known as:  PLAVIX Take 1 tablet (75 mg total) by mouth daily.   Darbepoetin Alfa 60 MCG/0.3ML Sosy injection Commonly known as:  ARANESP Inject 0.3 mLs (60 mcg total) into the vein  every Tuesday with hemodialysis. Start taking on:  11/15/2017   docusate sodium 100 MG capsule Commonly known as:  COLACE Take 1 capsule (100 mg total) by mouth 2 (two) times daily. What changed:  when to take this   glucagon 1 MG injection Commonly known as:  GLUCAGON EMERGENCY Inject 1 mg into the vein once as needed (low blood sugar and inability to eat or drink sugar).   insulin aspart 100 UNIT/ML injection Commonly known as:  novoLOG Sliding scale CBG 70 - 120: 0 units CBG 121 - 150: 1 unit,  CBG 151 - 200: 2 units,  CBG 201 - 250: 3 units,  CBG 251 - 300: 5 units,  CBG 301 - 350: 7 units,  CBG 351 - 400: 9 units   CBG > 400: 9 units and notify your MD What changed:    how much to take  how to take this  when to take this  additional instructions   insulin glargine 100 UNIT/ML injection Commonly known as:  LANTUS Inject 0.07 mLs (7 Units total) into the skin at bedtime.   ipratropium-albuterol 0.5-2.5 (3) MG/3ML Soln Commonly known as:  DUONEB Take 3 mLs by nebulization 3 (three) times daily as needed (for shortness of breath).   lamoTRIgine 150 MG tablet Commonly known as:  LAMICTAL Take 150 mg by mouth at bedtime.   lanthanum 1000 MG chewable tablet Commonly known as:  FOSRENOL Chew 2 tablets (2,000 mg total) by mouth 3 (three) times daily with meals.   methocarbamol 500 MG tablet Commonly known as:  ROBAXIN Take 1 tablet (500 mg total) by mouth every 6 (six) hours as needed for muscle spasms.   multivitamin Tabs tablet Take 1 tablet by mouth at bedtime. What changed:  Another medication with the same name was removed. Continue taking this medication, and follow the directions you see here.   oxyCODONE 5 MG immediate release tablet Commonly known as:  Oxy IR/ROXICODONE Take 1 tablet (5 mg total) by mouth every 6 (six) hours as needed for severe pain. What changed:    how much to take  when to take this  reasons to take this   polyethylene glycol  packet Commonly known as:  MIRALAX / GLYCOLAX Take 17 g by mouth daily. What changed:    when to take this  reasons to take this   ZOFRAN 4 MG tablet Generic drug:  ondansetron Take 4 mg by mouth every 6 (six) hours as needed for nausea.       Follow Up Appointments:  Contact information for follow-up providers    Melrose Nakayama, MD. Go on 12/20/2017.   Specialty:  Cardiothoracic Surgery Why:  PA/LAT CXR to be taken (at [facility] which is in the same building as Dr. Leonarda Salon office) on 12/20/2017 at 10:30 am;Appointment time is at 11:00 am Contact information: [place] [address] [phone]        Burtis Junes, NP. Go on 12/06/2017.   Specialties:  Nurse Practitioner, Interventional Cardiology, Cardiology, Radiology Why:  Appointment is at 8:00 am Contact information: [place] [place] [address] [phone]        Medical Doctor. Call.   Why:  for a follow up appointment regardinf further diabetes mangagement and surveillance of HGA1C 12.1           Contact information for after-discharge care    Destination    [facility] .   Service:  Skilled Nursing Contact information: [address] [place] [phone]                  Signed: Alvester Chou 11/09/2017, 8:10 AM

## 2017-11-08 NOTE — Progress Notes (Signed)
[id number] RN stated pt might go to Rehab after dialysis. Briefly went to see pt to encourage IS and discuss sternal precautions. Reminded pt to keep arms close to body in the tube. Discussed not smoking and pt stated he did not think he would have any problem quitting. Did not want a fake cigarette. Left tobacco cessation handout. Encouraged pt to walk and work with Rehab when discharged. Encouraged him to talk with dietitian at HD but he stated he knew what to eat as he has been on HD for 12 years. Asked pt what had changed to make his A1C go up so much in seven months. He stated he had better control before Rehab. Did not refer to CRP 2 as pt has dialysis on MWF mornings. Same days as CRP 2. Graylon Good RN BSN 11/08/2017 1:51 PM

## 2017-11-08 NOTE — Progress Notes (Signed)
Family Medicine Teaching Service Consult Note Intern Pager: [phone]  Patient name: [name] Bodkin Sr. Medical record number: [id number] Date of birth: Jun 01, 1976 Age: 42 y.o. Gender: male  Primary Care Provider: Patient, [name] Per Consultants: Cardiology Code Status: Full   Pt Overview and Major Events to Date:   Assessment and Plan: [name] Walraven Sr. is a 42 y.o. male presenting with chest pain . PMH is significant for T2DM, CAD, HTN, S/P unilateral BKA, ESRD, HrEF, PAD   CAD s/p CABG x3:  - Per primary Team  T2DM- A1C 12.1. Sliding Scale in Nursing Home -sSSI w/ cbgs- 213,206 -Lantus 8u nightly   Chronic HFrEF, grade 2DD.  ECHO EF 50-55% with G1DD.   - Per primary team  HTN:  -per primary team  PVD- s/p left BKA -per primary team  ESRD: on HD MWF. HD  - nephro following for HD, appreciate assistance   Chronic pain: -home robaxin  Tobacco Abuse  - Nicotine patch  Mood disorder -home lamictal 150  FEN/GI: renal/carb modified diet  Prophylaxis: lovenox  Disposition: per primary team  Subjective:  Patient states that his chest pain is improved today.  He is annoyed because he has not been brought his breakfast tray.  Objective: Temp:  [98.3 F (36.8 C)-98.7 F (37.1 C)] 98.6 F (37 C) (03/26 0447) Pulse Rate:  [86-102] 93 (03/26 0447) Resp:  [5-24] 18 (03/26 0447) BP: (111-135)/(69-85) 121/71 (03/26 0447) SpO2:  [93 %-96 %] 96 % (03/26 0447) Weight:  [178 lb 9.2 oz (81 kg)] 178 lb 9.2 oz (81 kg) (03/26 0447) Physical Exam: General: NAD, pleasant ENTM: Moist mucous membranes Cardiovascular: RRR, no m/r/g, no LE edema Respiratory: CTA BL, normal work of breathing MSK: s/p L BKA  Laboratory: Recent Labs  Lab 11/06/17 0356 11/07/17 0734 11/08/17 0329  WBC 11.8* 11.0* 9.1  HGB 9.0* 9.3* 9.2*  HCT 27.8* 28.6* 28.6*  PLT 212 273 327   Recent Labs  Lab 11/03/17 2343  11/06/17 0356 11/07/17 0734 11/08/17 0329  NA 132*    < > 133* 133* 131*  K 3.9   < > 4.9 4.1 4.2  CL 91*   < > 95* 93* 92*  CO2 26   < > 25 25 25   BUN 33*   < > 33* 32* 49*  CREATININE 7.85*   < > 7.99* 7.66* 9.70*  CALCIUM 8.6*   < > 8.6* 8.2* 8.4*  PROT 8.1  --   --   --   --   BILITOT 1.0  --   --   --   --   ALKPHOS 89  --   --   --   --   ALT 39  --   --   --   --   AST 35  --   --   --   --   GLUCOSE 317*   < > 123* 176* 213*   < > = values in this interval not displayed.   [name], [place], DO 11/08/2017, 8:07 AM PGY-1 [place] Intern pager: [phone], text pages welcome

## 2017-11-08 NOTE — Discharge Instructions (Signed)
We ask the SNF to please do the following: 1. Please obtain vital signs at least one time daily 2.Please weigh the patient daily. If he or she continues to gain weight or develops lower extremity edema, contact the office at [phone]. 3. Ambulate patient at least three times daily and please use sternal precautions.  Coronary Artery Bypass Grafting, Care After This sheet gives you information about how to care for yourself after your procedure. Your health care provider may also give you more specific instructions. If you have problems or questions, contact your health care provider. What can I expect after the procedure? After the procedure, it is common to have:  Nausea and a lack of appetite.  Constipation.  Weakness and fatigue.  Depression or irritability.  Pain or discomfort in your incision areas.  Follow these instructions at home: Medicines  Take over-the-counter and prescription medicines only as told by your health care provider. Do not stop taking medicines or start any new medicines without approval from your health care provider.  If you were prescribed an antibiotic medicine, take it as told by your health care provider. Do not stop taking the antibiotic even if you start to feel better.  Do not drive or use heavy machinery while taking prescription pain medicine. Incision care  Follow instructions from your health care provider about how to take care of your incisions. Make sure you: ? Wash your hands with soap and water before you change your bandage (dressing). If soap and water are not available, use hand sanitizer. ? Change your dressing as told by your health care provider. ? Leave stitches (sutures), skin glue, or adhesive strips in place. These skin closures may need to stay in place for 2 weeks or longer. If adhesive strip edges start to loosen and curl up, you may trim the loose edges. Do not remove adhesive strips completely unless your health care  provider tells you to do that.  Keep incision areas clean, dry, and protected.  Check your incision areas every day for signs of infection. Check for: ? More redness, swelling, or pain. ? More fluid or blood. ? Warmth. ? Pus or a bad smell.  If incisions were made in your legs: ? Avoid crossing your legs. ? Avoid sitting for long periods of time. Change positions every 30 minutes. ? Raise (elevate) your legs when you are sitting. Bathing  Do not take baths, swim, or use a hot tub until your health care provider approves.  Only take sponge baths. Pat the incisions dry. Do not rub incisions with a washcloth or towel.  Ask your health care provider when you can shower. Eating and drinking  Eat foods that are high in fiber, such as raw fruits and vegetables, whole grains, beans, and nuts. Meats should be lean cut. Avoid canned, processed, and fried foods. This can help prevent constipation and is a recommended part of a heart-healthy diet.  Drink enough fluid to keep your urine clear or pale yellow.  Limit alcohol intake to no more than 1 drink a day for nonpregnant women and 2 drinks a day for men. One drink equals 12 oz of beer, 5 oz of wine, or 1 oz of hard liquor. Activity  Rest and limit your activity as told by your health care provider. You may be instructed to: ? Stop any activity right away if you have chest pain, shortness of breath, irregular heartbeats, or dizziness. Get help right away if you have any of these  symptoms. ? Move around frequently for short periods or take short walks as directed by your health care provider. Gradually increase your activities. You may need physical therapy or cardiac rehabilitation to help strengthen your muscles and build your endurance. ? Avoid lifting, pushing, or pulling anything that is heavier than 10 lb (4.5 kg) for at least 6 weeks or as told by your health care provider.  Do not drive until your health care provider  approves.  Ask your health care provider when you may return to work.  Ask your health care provider when you may resume sexual activity. General instructions  Do not use any products that contain nicotine or tobacco, such as cigarettes and e-cigarettes. If you need help quitting, ask your health care provider.  Take 2-3 deep breaths every few hours during the day, while you recover. This helps expand your lungs and prevent complications like pneumonia after surgery.  If you were given a device called an incentive spirometer, use it several times a day to practice deep breathing. Support your chest with a pillow or your arms when you take deep breaths or cough.  Wear compression stockings as told by your health care provider. These stockings help to prevent blood clots and reduce swelling in your legs.  Weigh yourself every day. This helps identify if your body is holding (retaining) fluid that may make your heart and lungs work harder.  Keep all follow-up visits as told by your health care provider. This is important. Contact a health care provider if:  You have more redness, swelling, or pain around any incision.  You have more fluid or blood coming from any incision.  Any incision feels warm to the touch.  You have pus or a bad smell coming from any incision  You have a fever.  You have swelling in your ankles or legs.  You have pain in your legs.  You gain 2 lb (0.9 kg) or more a day.  You are nauseous or you vomit.  You have diarrhea. Get help right away if:  You have chest pain that spreads to your jaw or arms.  You are short of breath.  You have a fast or irregular heartbeat.  You notice a "clicking" in your breastbone (sternum) when you move.  You have numbness or weakness in your arms or legs.  You feel dizzy or light-headed. Summary  After the procedure, it is common to have pain or discomfort in the incision areas.  Do not take baths, swim, or use a  hot tub until your health care provider approves.  Gradually increase your activities. You may need physical therapy or cardiac rehabilitation to help strengthen your muscles and build your endurance.  Weigh yourself every day. This helps identify if your body is holding (retaining) fluid that may make your heart and lungs work harder. This information is not intended to replace advice given to you by your health care provider. Make sure you discuss any questions you have with your health care provider. Document Released: 02/19/2005 Document Revised: 06/21/2016 Document Reviewed: 06/21/2016 Elsevier Interactive Patient Education  [employer].

## 2017-11-08 NOTE — Procedures (Signed)
Patient seen and examined on Hemodialysis. QB 400 mL/min via LUE AVF UF goal 3L.  Tolerating rx well.  Treatment adjusted as needed.  Madelon Lips MD [facility] pgr [phone] 3:50 PM

## 2017-11-08 NOTE — Progress Notes (Signed)
[facility] Progress Note   Dialysis Orders: [facility] MWF 4h  F200  500/800  73kg   2/2 bath  LUE AVF  Hep 2400 -Calcitriol 1.25 mcg PO TIWPTH 275 -Venofer 50 mg IV weekly (last dose 10/26/17)  Assessment: 1. Chest pain/ severe 3v CAD: now s/p CABG 3.22.19 2. ESRD -MWF HD usual schedule. Had short HD Sat w/ 2 L off, HD 2.5 hrs wee hrs of this AM.  Will plan next session for Tuesday 3/26 with plan of next HD with OP unit on Wednesday if d/c'd today 3. Hypertension/volume- vol a little better, continue to chip away at it, but has been doing bed weights so unclear if accurate.  Will ask staff to do standing wt today. 4. Anemia of ckd - HGB 14.2No ESA as OP, now is 9.2 post-op so will start ESA with HD today, Aranesp 60 mcg. 5. Metabolic bone disease: on fosrenol 1000 mg TID AC and calcitriol 1.25 mcg each HD. 6. Nutrition -Albumin 3.5Renal/Carb Mod diet, renal vit, nepro 7. DM per primary 8. Disp - patient living at [facility] since last August 9. 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     Madelon Lips MD [facility] pgr [phone] 11/08/2017, 11:37 AM   Subjective:   No acute events overnight.  Grumpy as breakfast was delivered late this AM.  For possible d/c today.     Objective Vitals:   11/07/17 2100 11/07/17 2315 11/08/17 0447 11/08/17 1103  BP: 124/75 135/78 121/71 129/71  Pulse:  90 93   Resp: 15 16 18 18   Temp: 98.7 F (37.1 C) 98.7 F (37.1 C) 98.6 F (37 C)   TempSrc: Oral Oral Oral   SpO2:  96% 96% 94%  Weight:  81 kg (178 lb 9.2 oz) 81 kg (178 lb 9.2 oz)   Height:  6\' 2"  (1.88 m)     Physical Exam General: slender male NAD Heart: RRR, sternotomy incision c/d/i Lungs: no rales Abdomen: soft NT Extremities: left BKA mild LE edema Dialysis Access: left AVF + bruit  Additional Objective Labs: Basic Metabolic Panel: Recent Labs  Lab 11/02/17 0326 11/03/17 0412  11/04/17 0409  11/06/17 0356 11/07/17 0734 11/08/17 0329  NA  131* 130*   < > 132*   < > 133* 133* 131*  K 4.4 4.8   < > 4.3   < > 4.9 4.1 4.2  CL 91* 87*   < > 91*   < > 95* 93* 92*  CO2 23 24   < > 25   < > 25 25 25   GLUCOSE 140* 130*   < > 256*   < > 123* 176* 213*  BUN 63* 90*   < > 44*   < > 33* 32* 49*  CREATININE 10.88* 13.42*   < > 8.83*   < > 7.99* 7.66* 9.70*  CALCIUM 9.4 8.9   < > 8.8*   < > 8.6* 8.2* 8.4*  PHOS 7.1* 6.5*  --  4.5  --   --   --   --    < > = values in this interval not displayed.   Liver Function Tests: Recent Labs  Lab 11/03/17 0412 11/03/17 2343 11/04/17 0409  AST  --  35  --   ALT  --  39  --   ALKPHOS  --  89  --   BILITOT  --  1.0  --   PROT  --  8.1  --  ALBUMIN 3.5 3.8 3.5   No results for input(s): LIPASE, AMYLASE in the last 168 hours. CBC: Recent Labs  Lab 11/04/17 2030  11/05/17 0412  11/06/17 0356 11/07/17 0734 11/08/17 0329  WBC 17.0*  --  14.6*  --  11.8* 11.0* 9.1  HGB 11.7*   < > 10.7*   < > 9.0* 9.3* 9.2*  HCT 35.7*   < > 33.1*   < > 27.8* 28.6* 28.6*  MCV 88.8  --  88.3  --  88.8 89.1 88.0  PLT 263  --  250  --  212 273 327   < > = values in this interval not displayed.   Blood Culture    Component Value Date/Time   SDES BLOOD RIGHT HAND 03/07/2017 2304   SPECREQUEST  03/07/2017 2304    BOTTLES DRAWN AEROBIC AND ANAEROBIC Blood Culture adequate volume   CULT NO GROWTH 5 DAYS 03/07/2017 2304   REPTSTATUS 03/13/2017 FINAL 03/07/2017 2304    Cardiac Enzymes: No results for input(s): CKTOTAL, CKMB, CKMBINDEX, TROPONINI in the last 168 hours. CBG: Recent Labs  Lab 11/07/17 0749 11/07/17 1147 11/07/17 1725 11/07/17 2113 11/08/17 0619  GLUCAP 179* 162* 189* 174* 206*   Iron Studies: No results for input(s): IRON, TIBC, TRANSFERRIN, FERRITIN in the last 72 hours. Lab Results  Component Value Date   INR 1.25 11/04/2017   INR 1.00 10/30/2017   INR 1.05 09/13/2014   Studies/Results: Dg Chest 2 View  Result Date: 11/07/2017 CLINICAL DATA:  Status post coronary bypass  grafting EXAM: CHEST - 2 VIEW COMPARISON:  11/06/2017 FINDINGS: Cardiac shadow is mildly prominent. Postsurgical changes are again seen. Mediastinal drain and left thoracostomy catheter have been removed in the interval. No pneumothorax is seen. The lungs are well aerated with small pleural effusions posteriorly. No bony abnormality is seen. IMPRESSION: No pneumothorax following tube removal. Tiny small pleural effusions. Electronically Signed   By: Inez Catalina M.D.   On: 11/07/2017 08:11   Medications: . sodium chloride     . acetaminophen  1,000 mg Oral Q6H  . aspirin EC  325 mg Oral Daily  . atorvastatin  80 mg Oral q1800  . bisacodyl  10 mg Oral Daily   Or  . bisacodyl  10 mg Rectal Daily  . calcitRIOL  1.25 mcg Oral Q M,W,F-HD  . carvedilol  6.25 mg Oral BID WC  . clopidogrel  75 mg Oral Daily  . docusate sodium  200 mg Oral Daily  . enoxaparin (LOVENOX) injection  30 mg Subcutaneous Q48H  . feeding supplement (NEPRO CARB STEADY)  237 mL Oral BID BM  . insulin aspart  0-9 Units Subcutaneous TID WC  . insulin glargine  8 Units Subcutaneous QHS  . lamoTRIgine  150 mg Oral QHS  . lanthanum  1,000 mg Oral TID WC  . multivitamin  1 tablet Oral QHS  . pantoprazole  40 mg Oral Daily  . ramelteon  8 mg Oral QHS  . sodium chloride flush  3 mL Intravenous Q12H

## 2017-11-09 LAB — GLUCOSE, CAPILLARY: GLUCOSE-CAPILLARY: 206 mg/dL — AB (ref 65–99)

## 2017-11-09 MED ORDER — ATORVASTATIN CALCIUM 80 MG PO TABS: 80.0000 mg | ORAL_TABLET | Freq: Every day | ORAL | 1 refills | Status: DC

## 2017-11-09 MED ORDER — CALCITRIOL 0.25 MCG PO CAPS: 1.2500 ug | ORAL_CAPSULE | ORAL | 1 refills | Status: AC

## 2017-11-09 MED ORDER — OXYCODONE HCL 5 MG PO TABS: 5.0000 mg | ORAL_TABLET | Freq: Four times a day (QID) | ORAL | 0 refills | Status: DC | PRN

## 2017-11-09 MED ORDER — DARBEPOETIN ALFA 60 MCG/0.3ML IJ SOSY: 60.0000 ug | PREFILLED_SYRINGE | INTRAMUSCULAR | 1 refills | Status: DC

## 2017-11-09 MED ORDER — ASPIRIN 325 MG PO TBEC: 325.0000 mg | DELAYED_RELEASE_TABLET | Freq: Every day | ORAL | 0 refills | Status: DC

## 2017-11-09 MED FILL — Sodium Bicarbonate IV Soln 8.4%: INTRAVENOUS | Qty: 50 | Status: AC

## 2017-11-09 MED FILL — Mannitol IV Soln 20%: INTRAVENOUS | Qty: 500 | Status: AC

## 2017-11-09 MED FILL — Electrolyte-R (PH 7.4) Solution: INTRAVENOUS | Qty: 3000 | Status: AC

## 2017-11-09 MED FILL — Heparin Sodium (Porcine) Inj 1000 Unit/ML: INTRAMUSCULAR | Qty: 30 | Status: AC

## 2017-11-09 MED FILL — Magnesium Sulfate Inj 50%: INTRAMUSCULAR | Qty: 10 | Status: AC

## 2017-11-09 MED FILL — Heparin Sodium (Porcine) Inj 1000 Unit/ML: INTRAMUSCULAR | Qty: 10 | Status: AC

## 2017-11-09 MED FILL — Potassium Chloride Inj 2 mEq/ML: INTRAVENOUS | Qty: 40 | Status: AC

## 2017-11-09 MED FILL — Lidocaine HCl IV Inj 20 MG/ML: INTRAVENOUS | Qty: 5 | Status: AC

## 2017-11-09 MED FILL — Sodium Chloride IV Soln 0.9%: INTRAVENOUS | Qty: 2000 | Status: AC

## 2017-11-09 NOTE — Progress Notes (Signed)
Clinical Social Worker facilitated patient discharge including contacting patient family and facility to confirm patient discharge plans.  Clinical information faxed to facility and family agreeable with plan.  CSW arranged ambulance transport via [facility] to [facility] .  RN to call [phone] (pt will go in room 417A)  report prior to discharge.  Clinical Social Worker will sign off for now as social work intervention is no longer needed. Please consult [place] again if new need arises.  Rhea Pink, MSW, [place]

## 2017-11-09 NOTE — Progress Notes (Signed)
Patient transported to [place] via [facility]. Patient left with all belongings.  Emelda Fear, RN

## 2017-11-09 NOTE — Care Management Note (Signed)
Case Management Note Previous CM note completed by Bethena Roys, RN 11/03/2017, 2:59 PM   Patient Details  Name: [name] Labella Sr. MRN: [id number] Date of Birth: [birth year]  Subjective/Objective:  Pt presented for Chest Pain-s/p cath 10-31-17  Revealed Multivessel CAD- Plan for CABG 11-04-17. PTA from [facility] SNF-plan will be to return post procedure.                  Action/Plan: CSW and CM following for additional needs.   Expected Discharge Date:  11/09/17               Expected Discharge Plan:  Skilled Nursing Facility([facility] [phone])  In-House Referral:  Clinical Social Work  Discharge planning Services  CM Consult  Post Acute Care Choice:  NA Choice offered to:  NA  DME Arranged:  N/A DME Agency:  NA  HH Arranged:  NA HH Agency:  NA  Status of Service:  Completed, signed off  If discussed at [facility] of Stay Meetings, dates discussed:  11-03-17, 11/08/17   Discharge Disposition: skilled facility   Additional Comments:  11/09/17- 1000-[name] Goodson RN, CM- pt stable for transition back to [facility] following for return to SNF.   11/07/17-  1140 - [name] Augustine RN, CM- pt s/p CABGx3 on 11/04/17-  Progressing with ambulation- per MD note today- will tx out of ICU- transition plan to return to [facility] when medically stable.   [name] [place], RN 11/09/2017, 12:09 PM [phone] 4E Transition Care Coordinator-- 2H coverage

## 2017-11-09 NOTE — Progress Notes (Signed)
      [address]       [place] [address]             [phone]      5 Days Post-Op Procedure(s) (LRB): CORONARY ARTERY BYPASS GRAFTING (CABG) x three, using left internal mammary artery and right    leg greater saphenous vein (N/A) TRANSESOPHAGEAL ECHOCARDIOGRAM (TEE) (N/A) Subjective: Feels okay this morning. Ready to go back to [place].   Objective: Vital signs in last 24 hours: Temp:  [97.4 F (36.3 C)-98.6 F (37 C)] 98.6 F (37 C) (03/27 0338) Pulse Rate:  [85-96] 89 (03/27 0338) Cardiac Rhythm: Normal sinus rhythm (03/26 2015) Resp:  [13-28] 17 (03/27 0338) BP: (113-156)/(62-85) 127/72 (03/27 0338) SpO2:  [92 %-98 %] 98 % (03/27 0338) Weight:  [170 lb 6.7 oz (77.3 kg)-175 lb 14.8 oz (79.8 kg)] 175 lb 0.7 oz (79.4 kg) (03/27 0338)     Intake/Output from previous day: 03/26 0701 - 03/27 0700 In: 250 [P.O.:250] Out: 2500  Intake/Output this shift: No intake/output data recorded.  General appearance: alert, cooperative and no distress Heart: regular rate and rhythm, S1, S2 normal, no murmur, click, rub or gallop Lungs: clear to auscultation bilaterally Abdomen: soft, non-tender; bowel sounds normal; no masses,  no organomegaly Extremities: extremities normal, atraumatic, no cyanosis or edema Wound: clean and dry  Lab Results: Recent Labs    11/07/17 0734 11/08/17 0329  WBC 11.0* 9.1  HGB 9.3* 9.2*  HCT 28.6* 28.6*  PLT 273 327   BMET:  Recent Labs    11/07/17 0734 11/08/17 0329  NA 133* 131*  K 4.1 4.2  CL 93* 92*  CO2 25 25  GLUCOSE 176* 213*  BUN 32* 49*  CREATININE 7.66* 9.70*  CALCIUM 8.2* 8.4*    PT/INR: No results for input(s): LABPROT, INR in the last 72 hours. ABG    Component Value Date/Time   PHART 7.342 (L) 11/04/2017 1946   HCO3 22.3 11/04/2017 1946   TCO2 26 11/05/2017 1851   ACIDBASEDEF 3.0 (H) 11/04/2017 1946   O2SAT 99.0 11/04/2017 1946   CBG (last 3)  Recent Labs    11/08/17 1907 11/08/17 2125  11/09/17 0620  GLUCAP 168* 194* 206*    Assessment/Plan: S/P Procedure(s) (LRB): CORONARY ARTERY BYPASS GRAFTING (CABG) x three, using left internal mammary artery and right    leg greater saphenous vein (N/A) TRANSESOPHAGEAL ECHOCARDIOGRAM (TEE) (N/A)  1. CV-NSR in the 90s, BP well controlled. Asa, statin, and Coreg.  2. Pulm-tolerating room air with good oxygen saturation.   3. Renal-HD yesterday, electrolytes okay.  4. H and H stable, platelets trending up 5. Blood glucose with moderate control. On Lantus and SSI.   Plan: discharge this morning.    LOS: 12 days    Elgie Collard 11/09/2017

## 2017-11-09 NOTE — Progress Notes (Signed)
Discharge report called to Gae Gallop, LPN at [facility]. Patients IV discontinued at this time. Patient will travel via [facility] once they get here.   Emelda Fear, RN

## 2017-11-09 NOTE — Progress Notes (Signed)
[facility] Progress Note   Dialysis Orders: [facility] MWF 4h  F200  500/800  73kg   2/2 bath  LUE AVF  Hep 2400 -Calcitriol 1.25 mcg PO TIWPTH 275 -Venofer 50 mg IV weekly (last dose 10/26/17)  Assessment: 1. Chest pain/ severe 3v CAD: now s/p CABG 3.22.19 2. ESRD -MWF HD usual schedule. Had short HD Sat w/ 2 L off, HD 2.5 hrs wee hrs of Mon AM.  Full HDTuesday 3/26, short HD today to get back on sched.  3. Hypertension/volume- vol a little better, continue to chip away at it, but has been doing bed weights so unclear if accurate.  Will ask staff to do standing wt- post wt was 77.3 4. Anemia of ckd - HGB 14.2No ESA as OP, now is 9.2 post-op so will start ESA with HD 3/26, Aranesp 60 mcg. 5. Metabolic bone disease: on fosrenol 1000 mg TID AC and calcitriol 1.25 mcg each HD. 6. Nutrition -Albumin 3.5Renal/Carb Mod diet, renal vit, nepro 7. DM per primary 8. Disp - patient living at [place] [facility] since last August, to go today 9. 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     Madelon Lips MD [facility] pgr [phone] 11/09/2017, 9:32 AM   Subjective:   No d/c yesterday.  Pt amenable to short HD session today to get on sched.     Objective Vitals:   11/08/17 2127 11/09/17 0000 11/09/17 0338 11/09/17 0844  BP:  113/62 127/72 (!) 119/56  Pulse:   89 80  Resp: 20 15 17 20   Temp: 98.4 F (36.9 C)  98.6 F (37 C)   TempSrc: Oral  Oral   SpO2: 92% 96% 98% 98%  Weight:   79.4 kg (175 lb 0.7 oz)   Height:       Physical Exam General: slender male NAD, sleeping but easily arousbale Heart: RRR, sternotomy incision c/d/i Lungs: no rales Abdomen: soft NT Extremities: left BKA mild LE edema Dialysis Access: left AVF + bruit  Additional Objective Labs: Basic Metabolic Panel: Recent Labs  Lab 11/03/17 0412  11/04/17 0409  11/06/17 0356 11/07/17 0734 11/08/17 0329 11/08/17 1243  NA 130*   < > 132*   < > 133* 133* 131*  --   K 4.8   < > 4.3   < > 4.9  4.1 4.2  --   CL 87*   < > 91*   < > 95* 93* 92*  --   CO2 24   < > 25   < > 25 25 25   --   GLUCOSE 130*   < > 256*   < > 123* 176* 213*  --   BUN 90*   < > 44*   < > 33* 32* 49*  --   CREATININE 13.42*   < > 8.83*   < > 7.99* 7.66* 9.70*  --   CALCIUM 8.9   < > 8.8*   < > 8.6* 8.2* 8.4*  --   PHOS 6.5*  --  4.5  --   --   --   --  5.6*   < > = values in this interval not displayed.   Liver Function Tests: Recent Labs  Lab 11/03/17 0412 11/03/17 2343 11/04/17 0409  AST  --  35  --   ALT  --  39  --   ALKPHOS  --  89  --   BILITOT  --  1.0  --   PROT  --  8.1  --   ALBUMIN 3.5 3.8 3.5   No results for input(s): LIPASE, AMYLASE in the last 168 hours. CBC: Recent Labs  Lab 11/04/17 2030  11/05/17 0412  11/06/17 0356 11/07/17 0734 11/08/17 0329  WBC 17.0*  --  14.6*  --  11.8* 11.0* 9.1  HGB 11.7*   < > 10.7*   < > 9.0* 9.3* 9.2*  HCT 35.7*   < > 33.1*   < > 27.8* 28.6* 28.6*  MCV 88.8  --  88.3  --  88.8 89.1 88.0  PLT 263  --  250  --  212 273 327   < > = values in this interval not displayed.   Blood Culture    Component Value Date/Time   SDES BLOOD RIGHT HAND 03/07/2017 2304   SPECREQUEST  03/07/2017 2304    BOTTLES DRAWN AEROBIC AND ANAEROBIC Blood Culture adequate volume   CULT NO GROWTH 5 DAYS 03/07/2017 2304   REPTSTATUS 03/13/2017 FINAL 03/07/2017 2304    Cardiac Enzymes: No results for input(s): CKTOTAL, CKMB, CKMBINDEX, TROPONINI in the last 168 hours. CBG: Recent Labs  Lab 11/08/17 0619 11/08/17 1206 11/08/17 1907 11/08/17 2125 11/09/17 0620  GLUCAP 206* 155* 168* 194* 206*   Iron Studies: No results for input(s): IRON, TIBC, TRANSFERRIN, FERRITIN in the last 72 hours. Lab Results  Component Value Date   INR 1.25 11/04/2017   INR 1.00 10/30/2017   INR 1.05 09/13/2014   Studies/Results: No results found. Medications: . sodium chloride     . acetaminophen  1,000 mg Oral Q6H  . aspirin EC  325 mg Oral Daily  . atorvastatin  80 mg Oral  q1800  . bisacodyl  10 mg Oral Daily   Or  . bisacodyl  10 mg Rectal Daily  . calcitRIOL  1.25 mcg Oral Q M,W,F-HD  . carvedilol  6.25 mg Oral BID WC  . clopidogrel  75 mg Oral Daily  . darbepoetin (ARANESP) injection - DIALYSIS  60 mcg Intravenous Q Tue-HD  . docusate sodium  200 mg Oral Daily  . enoxaparin (LOVENOX) injection  30 mg Subcutaneous Q48H  . feeding supplement (NEPRO CARB STEADY)  237 mL Oral BID BM  . insulin aspart  0-9 Units Subcutaneous TID WC  . insulin glargine  8 Units Subcutaneous QHS  . lamoTRIgine  150 mg Oral QHS  . lanthanum  1,000 mg Oral TID WC  . multivitamin  1 tablet Oral QHS  . pantoprazole  40 mg Oral Daily  . ramelteon  8 mg Oral QHS  . sodium chloride flush  3 mL Intravenous Q12H

## 2017-11-09 NOTE — Progress Notes (Signed)
Family Medicine Teaching Service Consult Note Intern Pager: [phone]  Patient name: [name] Buda Sr. Medical record number: [id number] Date of birth: November 16, 1975 Age: 42 y.o. Gender: male  Primary Care Provider: Patient, [name] Per Consultants: Cardiology Code Status: Full   Pt Overview and Major Events to Date:   Assessment and Plan: [name] Basey Sr. is a 42 y.o. male presenting with chest pain . PMH is significant for T2DM, CAD, HTN, S/P unilateral BKA, ESRD, HrEF, PAD   CAD s/p CABG x3:  - Per primary Team  T2DM- A1C 12.1. Sliding Scale in Nursing Home -sSSI w/ cbgs- 194, 206 -Lantus 8u nightly   Chronic HFrEF, grade 2DD.  ECHO EF 50-55% with G1DD.   - Per primary team  HTN:  -per primary team  PVD- s/p left BKA -per primary team  ESRD: on HD MWF. HD  - nephro following for HD, appreciate assistance   Chronic pain: -home robaxin  Tobacco Abuse  - Nicotine patch  Mood disorder -home lamictal 150  FEN/GI: renal/carb modified diet  Prophylaxis: lovenox  Disposition: per primary team  Subjective:  Patient having no complaints this morning and is ready tog o.   Objective: Temp:  [97.4 F (36.3 C)-98.6 F (37 C)] 98.6 F (37 C) (03/27 0338) Pulse Rate:  [85-96] 89 (03/27 0338) Resp:  [13-28] 17 (03/27 0338) BP: (113-156)/(62-85) 127/72 (03/27 0338) SpO2:  [92 %-98 %] 98 % (03/27 0338) Weight:  [170 lb 6.7 oz (77.3 kg)-175 lb 14.8 oz (79.8 kg)] 175 lb 0.7 oz (79.4 kg) (03/27 0338) Physical Exam: General: NAD, pleasant ENTM: Moist mucous membranes Cardiovascular: RRR, no m/r/g, no LE edema, well healing scar on chest  Respiratory: CTA BL, normal work of breathing Gastrointestinal: soft, nontender, nondistended MSK: s/p L BKA  Laboratory: Recent Labs  Lab 11/06/17 0356 11/07/17 0734 11/08/17 0329  WBC 11.8* 11.0* 9.1  HGB 9.0* 9.3* 9.2*  HCT 27.8* 28.6* 28.6*  PLT 212 273 327   Recent Labs  Lab 11/03/17 2343   11/06/17 0356 11/07/17 0734 11/08/17 0329  NA 132*   < > 133* 133* 131*  K 3.9   < > 4.9 4.1 4.2  CL 91*   < > 95* 93* 92*  CO2 26   < > 25 25 25   BUN 33*   < > 33* 32* 49*  CREATININE 7.85*   < > 7.99* 7.66* 9.70*  CALCIUM 8.6*   < > 8.6* 8.2* 8.4*  PROT 8.1  --   --   --   --   BILITOT 1.0  --   --   --   --   ALKPHOS 89  --   --   --   --   ALT 39  --   --   --   --   AST 35  --   --   --   --   GLUCOSE 317*   < > 123* 176* 213*   < > = values in this interval not displayed.   Claudine Stallings, [place], DO 11/09/2017, 6:40 AM PGY-1 [place] Intern pager: [phone], text pages welcome

## 2017-11-18 ENCOUNTER — Telehealth: Payer: Self-pay | Admitting: Nurse Practitioner

## 2017-11-18 NOTE — Telephone Encounter (Signed)
New Message   [name] from [facility] is calling for patient. They want to start him on Chantix but the want to discuss the dosage amount. Please call to discuss.

## 2017-11-18 NOTE — Telephone Encounter (Signed)
I spoke with [name]. She reports primary care ordered Chantix but requested dosing instructions be given by his dialysis doctor. Dialysis doctor has recommended 0.5 mg daily but requests approval be given by cardiology before pt starts due to pt's recent cardiac surgery.

## 2017-11-18 NOTE — Telephone Encounter (Signed)
Her primary / Eddie North MD should be able to take care of that !!!

## 2017-11-21 NOTE — Telephone Encounter (Signed)
Left message for [name] to call office

## 2017-11-21 NOTE — Telephone Encounter (Signed)
Follow up   [name] returning call for nurse about pt

## 2017-11-21 NOTE — Telephone Encounter (Signed)
Ok to start what ever renal suggests

## 2017-11-21 NOTE — Telephone Encounter (Signed)
[name] called back. Informed her of Dr. Kyla Balzarine recommendations. Per Dr. Johnsie Cancel, okay to start what ever renal suggests. [name] would like not fax to them at [place] at [phone]. Will fax note now to number provided.

## 2017-11-21 NOTE — Telephone Encounter (Signed)
Try to call [name] back with Dr. Kyla Balzarine recommendations. No answer, put on hold, and phone hung up. Will try again later.

## 2017-12-05 NOTE — Progress Notes (Deleted)
CARDIOLOGY OFFICE NOTE  Date:  12/05/2017    [name] Labella Sr. Date of Birth: 07/15/1976 Medical Record #[id number]  PCP:  Patient, [name] Per  Cardiologist:  Johnsie Cancel (not seen since 09/2014)  No chief complaint on file.   History of Present Illness: [name] Morison Sr. is a 42 y.o. male who presents today for a post hospital visit. Seen for Dr. Johnsie Cancel.   He has a history of ESRD on hemodialysis M-W-F at [facility]. 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 He also has hypertension, coronary artery disease, combined systolic and diastolic dysfunction and medical noncompliance.  He has not been seen here in this office since February of 2016.   On 10/28/2017 the patient developed an episode of midsternal chest pain that he rated 10/10 with radiation down the right arm. It was also associated with nausea, vomiting and shortness of breath. He presented to the emergency department and had inferolateral ST depression on EKG. He had a troponin of 0.7 and was admitted for further evaluation and treatment to include cardiology consultation. The peak troponin was 0.10. He was started on a heparin drip. He underwent cardiac catheterization and noted to have severe multivessel coronary artery disease. Cardiothoracic surgical consultation was done for consideration of CABG. Nephrology assisted with hemodialysis. Pre operative carotid duplex US showed no significant internal carotid artery stenosis bilaterally. He underwent a CABG x 3 on 11/04/2017.  Post op course was stable. Noted A1C over 12. Discharged to SNF for rehab.  Comes in today. Here with   Past Medical History:  Diagnosis Date  . Anemia   . Atherosclerosis of lower extremity ([place])   . CAD (coronary artery disease)   . Chronic combined systolic and diastolic heart failure ([place])   . Complication of anesthesia     . ESRD (end stage renal disease) on dialysis [facility])    "MWF [employer]" (03/08/2017)  . GERD (gastroesophageal reflux disease)   . Heart murmur   . History of blood transfusion    "related to OR"  . Hypertension   . PONV (postoperative nausea and vomiting)   . Type II diabetes mellitus ([place])     Past Surgical History:  Procedure Laterality Date  . ABDOMINAL AORTOGRAM N/A 11/02/2016   Procedure: Abdominal Aortogram;  Surgeon: Waynetta Sandy, MD;  Location: [place] CV LAB;  Service: Cardiovascular;  Laterality: N/A;  . AMPUTATION Left 09/27/2013   Procedure: LEFT GREAT TOE AMPUTATION;  Surgeon: [name], MD;  Location: [place];  Service: Orthopedics;  Laterality: Left;  . AMPUTATION Right 08/15/2015   Procedure: Right Great Toe Amputation;  Surgeon: [name], MD;  Location: [place];  Service: Orthopedics;  Laterality: Right;  . AMPUTATION Left 11/05/2016   Procedure: TRANSMETATARSAL AMPUTATION LEFT FOOT;  Surgeon: [name], MD;  Location: [place];  Service: Orthopedics;  Laterality: Left;  . AMPUTATION Left 03/11/2017   Procedure: LEFT BELOW KNEE AMPUTATION;  Surgeon: [name], MD;  Location: [place];  Service: Orthopedics;  Laterality: Left;  . AMPUTATION Right 03/11/2017   Procedure: RIGHT 2ND TOE AMPUTATION;  Surgeon: [name], MD;  Location: [place];  Service: Orthopedics;  Laterality: Right;  . AV FISTULA PLACEMENT  left arm  . CORONARY ARTERY BYPASS GRAFT N/A 11/04/2017   Procedure: CORONARY ARTERY BYPASS GRAFTING (CABG) x three, using left internal mammary artery and right  leg greater saphenous vein;  Surgeon: Melrose Nakayama, MD;  Location: [place];  Service: Open Heart Surgery;  Laterality: N/A;  . LEFT HEART CATH AND CORONARY ANGIOGRAPHY N/A 10/31/2017   Procedure: LEFT HEART CATH AND CORONARY ANGIOGRAPHY;  Surgeon: Troy Sine, MD;  Location: [place] CV LAB;  Service: Cardiovascular;  Laterality: N/A;  . LEFT HEART CATHETERIZATION WITH  CORONARY ANGIOGRAM N/A 09/13/2014   Procedure: LEFT HEART CATHETERIZATION WITH CORONARY ANGIOGRAM;  Surgeon: Sinclair Grooms, MD;  Location: [facility] CATH LAB;  Service: Cardiovascular;  Laterality: N/A;  . LOWER EXTREMITY ANGIOGRAPHY Bilateral 11/02/2016   Procedure: Lower Extremity Angiography;  Surgeon: Waynetta Sandy, MD;  Location: [place] CV LAB;  Service: Cardiovascular;  Laterality: Bilateral;  . PERIPHERAL VASCULAR ATHERECTOMY Left 11/02/2016   Procedure: Peripheral Vascular Atherectomy;  Surgeon: Waynetta Sandy, MD;  Location: [place] CV LAB;  Service: Cardiovascular;  Laterality: Left;  PERONEAL  . STUMP REVISION Left 01/11/2017   Procedure: Revision Left Transmetatarsal Amputation;  Surgeon: [name], MD;  Location: [place];  Service: Orthopedics;  Laterality: Left;  . TEE WITHOUT CARDIOVERSION N/A 11/04/2017   Procedure: TRANSESOPHAGEAL ECHOCARDIOGRAM (TEE);  Surgeon: Melrose Nakayama, MD;  Location: [place];  Service: Open Heart Surgery;  Laterality: N/A;     Medications: No outpatient medications have been marked as taking for the 12/06/17 encounter (Appointment) with Burtis Junes, NP.     Allergies: Allergies  Allergen Reactions  . Coconut Oil Anaphylaxis    Can use topically, allergic to coconut foods     Social History: The patient  reports that he has been smoking cigarettes.  He has a 13.00 pack-year smoking history. He has never used smokeless tobacco. He reports that he drinks alcohol. He reports that he has current or past drug history. Drug: Marijuana.   Family History: The patient's family history includes Heart failure in his mother; Hypertension in his mother.   Review of Systems: Please see the history of present illness.   Otherwise, the review of systems is positive for none.   All other systems are reviewed and negative.   Physical Exam: VS:  There were no vitals taken for this visit. Marland Kitchen  BMI There is no height or weight on  file to calculate BMI.  Wt Readings from Last 3 Encounters:  11/09/17 175 lb 0.7 oz (79.4 kg)  06/05/17 164 lb 10.9 oz (74.7 kg)  04/29/17 185 lb (83.9 kg)    General: Pleasant. Well developed, well nourished and in no acute distress.   HEENT: Normal.  Neck: Supple, no JVD, carotid bruits, or masses noted.  Cardiac: Regular rate and rhythm. No murmurs, rubs, or gallops. No edema.  Respiratory:  Lungs are clear to auscultation bilaterally with normal work of breathing.  GI: Soft and nontender.  MS: No deformity or atrophy. Gait and ROM intact.  Skin: Warm and dry. Color is normal.  Neuro:  Strength and sensation are intact and no gross focal deficits noted.  Psych: Alert, appropriate and with normal affect.   LABORATORY DATA:  EKG:  EKG is ordered today. This demonstrates .  Lab Results  Component Value Date   WBC 9.1 11/08/2017   HGB 9.2 (L) 11/08/2017   HCT 28.6 (L) 11/08/2017   PLT 327 11/08/2017   GLUCOSE 213 (H) 11/08/2017   CHOL 187 10/29/2017   TRIG 266 (H) 10/29/2017   HDL 35 (L) 10/29/2017   LDLCALC 99 10/29/2017   ALT 39 11/03/2017  AST 35 11/03/2017   NA 131 (L) 11/08/2017   K 4.2 11/08/2017   CL 92 (L) 11/08/2017   CREATININE 9.70 (H) 11/08/2017   BUN 49 (H) 11/08/2017   CO2 25 11/08/2017   TSH 1.011 10/29/2017   INR 1.25 11/04/2017   HGBA1C 12.1 (H) 10/29/2017     BNP (last 3 results) Recent Labs    06/04/17 1000  BNP >4,500.0*    ProBNP (last 3 results) No results for input(s): PROBNP in the last 8760 hours.   Other Studies Reviewed Today:  LEFT HEART CATH AND CORONARY ANGIOGRAPHY by Dr. Claiborne Billings on 03/182019:  Conclusion     Ost 2nd Diag to 2nd Diag lesion is 99% stenosed.  Prox LAD to Mid LAD lesion is 99% stenosed.  Ost 2nd Mrg to 2nd Mrg lesion is 80% stenosed.  Mid Cx to Dist Cx lesion is 100% stenosed.  Ost RCA to Prox RCA lesion is 20% stenosed.  Mid RCA lesion is 40% stenosed.  Dist RCA-1 lesion is 90%  stenosed.  Dist RCA-2 lesion is 80% stenosed.  The left ventricular systolic function is normal.  LV end diastolic pressure is normal.  Severe multivessel CAD with coronary calcification involving all major coronary arteries. There is 99% subtotal occlusion of a small second diagonal branch of the LAD. The mid LAD is very tortuous and has diffuse 95% nodular very calcified stenoses in the mid segment; the circumflex vessel has 80% marginal stenosis and is occluded after this marginal takeoff with collateralization retrograde distally; and a large dominant calcified RCA with 20% proximal 40% mid, and focal 90 and 80% stenoses after the acute margin and before the PDA takeoff.  Mild LV dysfunction with an ejection fraction of 40 to less than 45% with distal anterolateral apical hypocontractility. LVEDP 5-7 mm.  RECOMMENDATION: With the extensive coronary calcification and multivessel CAD recommend surgical consultation for CABG revascularization.   CABG x 3 10/2017 Median sternotomy, extracorporeal circulation, coronary artery bypass grafting x3 (left internal mammary artery to left anterior descending, saphenous vein graft to obtuse marginal 1, saphenous vein graft to posterior descending) by Dr. Roxan Hockey on 11/04/2017.    Assessment/Plan:  1. ACS - severe 3VD - now s/p CABG x 3  2. Uncontrolled DM  3. HLD  4. HTN  5. ESRD - on dialysis  6. History of non compliance  Current medicines are reviewed with the patient today.  The patient does not have concerns regarding medicines other than what has been noted above.  The following changes have been made:  See above.  Labs/ tests ordered today include:   No orders of the defined types were placed in this encounter.    Disposition:   FU with *** in {gen number [id number]:[id number]} {Days to years:10300}.   Patient is agreeable to this plan and will call if any problems develop in the interim.   SignedTruitt Merle,  NP  12/05/2017 4:32 PM  [facility] [address] [place], [place]  [address] Phone: [phone] Fax: [phone]

## 2017-12-06 ENCOUNTER — Ambulatory Visit: Payer: Medicaid Other | Admitting: Nurse Practitioner

## 2017-12-19 ENCOUNTER — Other Ambulatory Visit: Payer: Self-pay | Admitting: Thoracic Surgery (Cardiothoracic Vascular Surgery)

## 2017-12-19 DIAGNOSIS — Z951 Presence of aortocoronary bypass graft: Secondary | ICD-10-CM

## 2017-12-20 ENCOUNTER — Encounter: Payer: Self-pay | Admitting: Thoracic Surgery (Cardiothoracic Vascular Surgery)

## 2017-12-20 ENCOUNTER — Ambulatory Visit (INDEPENDENT_AMBULATORY_CARE_PROVIDER_SITE_OTHER): Payer: Self-pay | Admitting: Thoracic Surgery (Cardiothoracic Vascular Surgery)

## 2017-12-20 ENCOUNTER — Other Ambulatory Visit: Payer: Self-pay

## 2017-12-20 ENCOUNTER — Ambulatory Visit
Admission: RE | Admit: 2017-12-20 | Discharge: 2017-12-20 | Disposition: A | Discharge status: Home / Self Care | Payer: Medicaid Other | Source: Ambulatory Visit | Attending: Thoracic Surgery (Cardiothoracic Vascular Surgery) | Admitting: Thoracic Surgery (Cardiothoracic Vascular Surgery)

## 2017-12-20 VITALS — BP 145/63 | HR 72 | Resp 16 | Ht 74.0 in | Wt 168.0 lb

## 2017-12-20 DIAGNOSIS — I249 Acute ischemic heart disease, unspecified: Secondary | ICD-10-CM

## 2017-12-20 DIAGNOSIS — I25110 Atherosclerotic heart disease of native coronary artery with unstable angina pectoris: Secondary | ICD-10-CM

## 2017-12-20 DIAGNOSIS — Z951 Presence of aortocoronary bypass graft: Secondary | ICD-10-CM

## 2017-12-20 NOTE — Progress Notes (Signed)
[address]       [place], [address]             [phone]      HPI: [name] returns for a scheduled postoperative follow-up visit  [name] is a 42 year old man with a history of diabetes, hypertension, end-stage renal disease on hemodialysis, combined systolic and diastolic heart failure, left below-knee amputation, and medical noncompliance.  He presented in March with chest pain radiating to his right arm.  He ruled in for a non-ST elevation MI with a troponin of 0.1.  Cardiac catheterization showed severe multivessel disease.  He underwent coronary bypass grafting x3 on 11/04/2017.  He had diffusely diseased calcified target vessels that were poor quality.  He was discharged to [facility] on postoperative day #5.  He says he feels pretty well.  He ha noticed a bump on his chest that has enlarged.  He has not had any fevers or chills.  He is pain is well controlled.  He has not had any angina.  Past Medical History:  Diagnosis Date  . Anemia   . Atherosclerosis of lower extremity ([place])   . CAD (coronary artery disease)   . Chronic combined systolic and diastolic heart failure ([place])   . Complication of anesthesia   . ESRD (end stage renal disease) on dialysis [facility])    "MWF [employer]" (03/08/2017)  . GERD (gastroesophageal reflux disease)   . Heart murmur   . History of blood transfusion    "related to OR"  . Hypertension   . PONV (postoperative nausea and vomiting)   . Type II diabetes mellitus ([place])     Current Outpatient Medications  Medication Sig Dispense Refill  . acetaminophen (TYLENOL) 500 MG tablet Take 1,000 mg by mouth every 6 (six) hours as needed (for mild pain and CANNOT EXCEED 4,000 MG/24 HOURS).     Marland Kitchen aspirin 325 MG EC tablet Take 1 tablet (325 mg total) by mouth daily. 30 tablet 0  . atorvastatin (LIPITOR) 80 MG tablet Take 1 tablet (80 mg total) by mouth daily at 6 PM. 30 tablet 1  . calcitRIOL (ROCALTROL) 0.25 MCG  capsule Take 5 capsules (1.25 mcg total) by mouth every Monday, Wednesday, and Friday with hemodialysis. 30 capsule 1  . calcium acetate (PHOSLO) 667 MG capsule Take 3 capsules (2,001 mg total) by mouth 3 (three) times daily with meals. (Patient taking differently: Take 1,334 mg by mouth 3 (three) times daily with meals. )    . carvedilol (COREG) 6.25 MG tablet Take 1 tablet (6.25 mg total) by mouth 2 (two) times daily with a meal.    . clopidogrel (PLAVIX) 75 MG tablet Take 1 tablet (75 mg total) by mouth daily. 30 tablet 3  . Darbepoetin Alfa (ARANESP) 60 MCG/0.3ML SOSY injection Inject 0.3 mLs (60 mcg total) into the vein every Tuesday with hemodialysis. 4.2 mL 1  . docusate sodium (COLACE) 100 MG capsule Take 1 capsule (100 mg total) by mouth 2 (two) times daily. (Patient taking differently: Take 100 mg by mouth at bedtime. ) 10 capsule 0  . glucagon (GLUCAGON EMERGENCY) 1 MG injection Inject 1 mg into the vein once as needed (low blood sugar and inability to eat or drink sugar). 1 each 0  . insulin aspart (NOVOLOG) 100 UNIT/ML injection Sliding scale CBG 70 - 120: 0 units CBG 121 - 150: 1 unit,  CBG 151 - 200: 2 units,  CBG 201 - 250: 3  units,  CBG 251 - 300: 5 units,  CBG 301 - 350: 7 units,  CBG 351 - 400: 9 units   CBG > 400: 9 units and notify your MD (Patient taking differently: Inject 0-9 Units into the skin See admin instructions. Inject 0-9 units into the skin two times a day per sliding scale: BGL 70-120 = 0 units; 121-150 = 1 unit; 151-200 = 2 units; 201-250 = 3 units; 251-300 = 5 units; 301-350 = 7 units; 351-400 = 9 units; >400 = 9 units and call MD) 10 mL 11  . insulin glargine (LANTUS) 100 UNIT/ML injection Inject 0.07 mLs (7 Units total) into the skin at bedtime. 10 mL 12  . ipratropium-albuterol (DUONEB) 0.5-2.5 (3) MG/3ML SOLN Take 3 mLs by nebulization 3 (three) times daily as needed (for shortness of breath).     . lamoTRIgine (LAMICTAL) 150 MG tablet Take 150 mg by mouth at  bedtime.    Marland Kitchen lanthanum (FOSRENOL) 1000 MG chewable tablet Chew 2 tablets (2,000 mg total) by mouth 3 (three) times daily with meals. 180 tablet 0  . methocarbamol (ROBAXIN) 500 MG tablet Take 1 tablet (500 mg total) by mouth every 6 (six) hours as needed for muscle spasms.    . Multiple Vitamins-Minerals (CERTA-VITE PO) Take 1 tablet by mouth daily.    . Nutritional Supplements (BOOST GLUCOSE CONTROL) LIQD Take 8 oz by mouth as directed.    . ondansetron (ZOFRAN) 4 MG tablet Take 4 mg by mouth every 6 (six) hours as needed for nausea.     Marland Kitchen oxyCODONE (OXY IR/ROXICODONE) 5 MG immediate release tablet Take 1 tablet (5 mg total) by mouth every 6 (six) hours as needed for severe pain. 30 tablet 0  . polyethylene glycol (MIRALAX / GLYCOLAX) packet Take 17 g by mouth daily. (Patient taking differently: Take 17 g by mouth daily as needed for mild constipation. ) 14 each 0  . varenicline (CHANTIX) 0.5 MG tablet Take 0.5 mg by mouth daily.     No current facility-administered medications for this visit.     Physical Exam BP (!) 145/63 (BP Location: Right Arm, Patient Position: Sitting, Cuff Size: Large)   Pulse 72   Resp 16   Ht 6\' 2"  (1.88 m)   Wt 167 lb 15.9 oz (76.2 kg)   SpO2 98% Comment: ON RA  BMI 21.71 kg/m  42 year old man in no acute distress Alert and oriented x3 with no focal deficits Cardiac regular rate and rhythm normal S1 and S2 Sternum stable, incision intact and healing well 1 cm subcutaneous lesion to right of sternum with overlying skin disrupted consistent with carbuncle.  Larger 3 cm area with minimal surrounding erythema and mild tenderness to left external incision Leg incisions healing well  Diagnostic Tests: CHEST - 2 VIEW  COMPARISON:  Chest radiograph 11/07/2017.  FINDINGS: Stable enlarged cardiac and mediastinal contours status post median sternotomy. Mild bilateral perihilar interstitial pulmonary opacities. No pleural effusion or pneumothorax. Midthoracic  spine degenerative changes.  IMPRESSION: Mild perihilar interstitial opacities may represent pulmonary vascular redistribution and or mild edema.   Electronically Signed   By: Lovey Newcomer M.D.   On: 12/20/2017 11:18 I personally reviewed the chest x-ray images and concur with the findings noted above  Impression: [name] is a 42 year old man with multiple cardiac risk factors and end-stage renal disease.  He presented with a non-ST elevation MI.  He had severe three-vessel disease.  He underwent coronary bypass grafting x3 about 6 weeks  ago.  He had poor quality diffusely diseased and calcified target vessels.  He has done well from a surgical standpoint.  His sternum is stable and his incision appears to be healing nicely.  I am concerned about the carbuncles on his chest particular the one on the left side which is relatively close to the incision.  I think he should be treated for that.  I think the best option in his case would be to give him a dose of vancomycin on dialysis tomorrow.  We will talk to the dialysis unit about that.  He continues to rehab from his below-knee amputation.  There are no restrictions on his activities from my standpoint.  Plan: Antibiotics for subcutaneous chest lesions Needs follow-up with cardiology.  We will help him arrange that  Melrose Nakayama, MD [facility] [phone]

## 2017-12-21 ENCOUNTER — Ambulatory Visit: Payer: Medicaid Other | Admitting: Cardiology

## 2018-01-09 ENCOUNTER — Encounter (HOSPITAL_COMMUNITY): Payer: Self-pay

## 2018-01-09 ENCOUNTER — Emergency Department (HOSPITAL_COMMUNITY)
Admission: EM | Admit: 2018-01-09 | Discharge: 2018-01-09 | Disposition: A | Discharge status: Home / Self Care | Payer: Medicaid Other | Attending: Emergency Medicine | Admitting: Emergency Medicine

## 2018-01-09 DIAGNOSIS — Z992 Dependence on renal dialysis: Secondary | ICD-10-CM | POA: Diagnosis not present

## 2018-01-09 DIAGNOSIS — N186 End stage renal disease: Secondary | ICD-10-CM | POA: Diagnosis not present

## 2018-01-09 DIAGNOSIS — I132 Hypertensive heart and chronic kidney disease with heart failure and with stage 5 chronic kidney disease, or end stage renal disease: Secondary | ICD-10-CM | POA: Diagnosis not present

## 2018-01-09 DIAGNOSIS — Z7982 Long term (current) use of aspirin: Secondary | ICD-10-CM | POA: Diagnosis not present

## 2018-01-09 DIAGNOSIS — I5042 Chronic combined systolic (congestive) and diastolic (congestive) heart failure: Secondary | ICD-10-CM | POA: Diagnosis not present

## 2018-01-09 DIAGNOSIS — I2510 Atherosclerotic heart disease of native coronary artery without angina pectoris: Secondary | ICD-10-CM | POA: Diagnosis not present

## 2018-01-09 DIAGNOSIS — L02213 Cutaneous abscess of chest wall: Secondary | ICD-10-CM | POA: Diagnosis not present

## 2018-01-09 DIAGNOSIS — Z951 Presence of aortocoronary bypass graft: Secondary | ICD-10-CM | POA: Insufficient documentation

## 2018-01-09 DIAGNOSIS — Z7902 Long term (current) use of antithrombotics/antiplatelets: Secondary | ICD-10-CM | POA: Diagnosis not present

## 2018-01-09 DIAGNOSIS — F17210 Nicotine dependence, cigarettes, uncomplicated: Secondary | ICD-10-CM | POA: Diagnosis not present

## 2018-01-09 DIAGNOSIS — R222 Localized swelling, mass and lump, trunk: Secondary | ICD-10-CM | POA: Diagnosis present

## 2018-01-09 DIAGNOSIS — Z794 Long term (current) use of insulin: Secondary | ICD-10-CM | POA: Insufficient documentation

## 2018-01-09 DIAGNOSIS — Z79899 Other long term (current) drug therapy: Secondary | ICD-10-CM | POA: Diagnosis not present

## 2018-01-09 DIAGNOSIS — E1122 Type 2 diabetes mellitus with diabetic chronic kidney disease: Secondary | ICD-10-CM | POA: Diagnosis not present

## 2018-01-09 LAB — CBC WITH DIFFERENTIAL/PLATELET
ABS IMMATURE GRANULOCYTES: 0 10*3/uL (ref 0.0–0.1)
BASOS PCT: 0 %
Basophils Absolute: 0 10*3/uL (ref 0.0–0.1)
Eosinophils Absolute: 0.3 10*3/uL (ref 0.0–0.7)
Eosinophils Relative: 3 %
HCT: 37.2 % — ABNORMAL LOW (ref 39.0–52.0)
Hemoglobin: 11.7 g/dL — ABNORMAL LOW (ref 13.0–17.0)
IMMATURE GRANULOCYTES: 0 %
LYMPHS ABS: 1.2 10*3/uL (ref 0.7–4.0)
Lymphocytes Relative: 13 %
MCH: 25.9 pg — ABNORMAL LOW (ref 26.0–34.0)
MCHC: 31.5 g/dL (ref 30.0–36.0)
MCV: 82.3 fL (ref 78.0–100.0)
MONOS PCT: 10 %
Monocytes Absolute: 0.9 10*3/uL (ref 0.1–1.0)
NEUTROS ABS: 6.8 10*3/uL (ref 1.7–7.7)
Neutrophils Relative %: 74 %
PLATELETS: 470 10*3/uL — AB (ref 150–400)
RBC: 4.52 MIL/uL (ref 4.22–5.81)
RDW: 18.5 % — ABNORMAL HIGH (ref 11.5–15.5)
WBC: 9.2 10*3/uL (ref 4.0–10.5)

## 2018-01-09 LAB — BASIC METABOLIC PANEL
ANION GAP: 13 (ref 5–15)
BUN: 9 mg/dL (ref 6–20)
CO2: 31 mmol/L (ref 22–32)
Calcium: 8.9 mg/dL (ref 8.9–10.3)
Chloride: 93 mmol/L — ABNORMAL LOW (ref 101–111)
Creatinine, Ser: 5.13 mg/dL — ABNORMAL HIGH (ref 0.61–1.24)
GFR, EST AFRICAN AMERICAN: 15 mL/min — AB (ref >60)
GFR, EST NON AFRICAN AMERICAN: 13 mL/min — AB (ref >60)
Glucose, Bld: 169 mg/dL — ABNORMAL HIGH (ref 65–99)
POTASSIUM: 3.4 mmol/L — AB (ref 3.5–5.1)
Sodium: 137 mmol/L (ref 135–145)

## 2018-01-09 MED ORDER — FENTANYL CITRATE (PF) 100 MCG/2ML IJ SOLN
50.0000 ug | Freq: Once | INTRAMUSCULAR | Status: AC
  Administered 2018-01-09: 50 ug via INTRAVENOUS
  Filled 2018-01-09: qty 2

## 2018-01-09 MED ORDER — VANCOMYCIN HCL 10 G IV SOLR
1500.0000 mg | Freq: Once | INTRAVENOUS | Status: AC
  Administered 2018-01-09: 1500 mg via INTRAVENOUS
  Filled 2018-01-09: qty 1500

## 2018-01-09 MED ORDER — ONDANSETRON HCL 4 MG/2ML IJ SOLN
4.0000 mg | Freq: Once | INTRAMUSCULAR | Status: AC
  Administered 2018-01-09: 4 mg via INTRAVENOUS
  Filled 2018-01-09: qty 2

## 2018-01-09 MED ORDER — VANCOMYCIN HCL 1000 MG IV SOLR: 750.0000 mg | INTRAVENOUS | 0 refills | Status: DC | PRN

## 2018-01-09 NOTE — ED Triage Notes (Addendum)
Pt arrived via [facility] from [facility]; Pt had CABG last wk of March and has reoccurrent cyst develping. Pt is LBKA and is on dialysis MWF and just completed today. Pt c/o pain 7/10; elevated BP; 78; 98% on RA; 20R

## 2018-01-09 NOTE — ED Provider Notes (Signed)
[facility] EMERGENCY DEPARTMENT Provider Note   CSN: [id number] Arrival date & time: 01/09/18  1537     History   Chief Complaint Chief Complaint  Patient presents with  . Cyst     Recent CABG 3/19 reoccurring "Cyst" per patient    HPI [name] Strojny Sr. is a 42 y.o. male.  Patient is a 42 year old male with a history of type 2 diabetes, coronary artery disease with diffuse vessel disease status post CABG x3 in March 2019, end-stage renal disease on dialysis who is presenting today with complaints of swollen tender areas on his chest.  Patient was seen by Dr. Roxan Hockey on 12/20/2017.  At that time patient had to areas that were similar to this.  It was thought that patient had a carbuncle and was placed on vancomycin during dialysis.  Patient states the 2 areas that he was initially evaluated for in March open and drained and are currently healing but now he has a new area for the last 1 week that is getting more uncomfortable and larger at the bottom of his sternal incision.  It is swollen, tender to the touch and slightly firm.  It has not been draining.  He denies fever, abdominal pain, internal chest pain or shortness of breath.  His last dialysis was today.  He thinks the vancomycin was finished approximately 1 week ago for the prior areas.  Touching it makes it worse but nothing seems to make it better.  It is gradually worsening.  Pain is currently 6 out of 10.  The history is provided by the patient.    Past Medical History:  Diagnosis Date  . Anemia   . Atherosclerosis of lower extremity ([place])   . CAD (coronary artery disease)   . Chronic combined systolic and diastolic heart failure ([place])   . Complication of anesthesia   . ESRD (end stage renal disease) on dialysis [facility])    "MWF [employer] [employer]" (03/08/2017)  . GERD (gastroesophageal reflux disease)   . Heart murmur   . History of blood transfusion    "related to OR"  . Hypertension   . PONV  (postoperative nausea and vomiting)   . Type II diabetes mellitus [facility])     Patient Active Problem List   Diagnosis Date Noted  . 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 ACS (acute coronary syndrome) ([place]) 10/28/2017  . Flash pulmonary edema ([place]) 06/04/2017  . Hypertensive emergency 06/04/2017  . 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 S/P unilateral BKA (below knee amputation), left ([place])   . Post-operative pain   . Acute blood loss anemia   . Chronic combined systolic and diastolic CHF (congestive heart failure) ([place])   . PVD (peripheral vascular disease) ([place])   . Coronary artery disease involving native coronary artery of native heart with unstable angina pectoris ([place])   . Tobacco abuse   . Diabetic foot infection ([place]) 03/08/2017  . Osteomyelitis ([place]) 03/08/2017  . Wound dehiscence   . Diabetes mellitus with complication ([place])   . Dehiscence of amputation stump ([place]) 01/04/2017  . S/P transmetatarsal amputation of foot, left ([place]) 11/16/2016  . Gangrene of right foot ([place]) 08/02/2016  . Achilles tendon contracture, left 08/02/2016  . Anemia of renal disease 08/16/2015  . Wound infection 08/15/2015  . Diabetic ulcer of right great toe ([place]) 08/15/2015  .  Pain in the chest   . Elevated troponin   . Essential hypertension   . ESRD on dialysis ([place]) 08/07/2013  . Diabetes ([place]) 08/07/2013  . Chest pain 05/16/2012  . Murmur 05/16/2012  . Renal disorder     Past Surgical History:  Procedure Laterality Date  . ABDOMINAL AORTOGRAM N/A 11/02/2016   Procedure: Abdominal Aortogram;  Surgeon: Waynetta Sandy, MD;  Location: [facility];  Service: Cardiovascular;  Laterality: N/A;  . AMPUTATION Left 09/27/2013   Procedure: LEFT GREAT TOE AMPUTATION;  Surgeon: [name], MD;  Location: [facility];  Service: Orthopedics;   Laterality: Left;  . AMPUTATION Right 08/15/2015   Procedure: Right Great Toe Amputation;  Surgeon: [name], MD;  Location: [facility];  Service: Orthopedics;  Laterality: Right;  . AMPUTATION Left 11/05/2016   Procedure: TRANSMETATARSAL AMPUTATION LEFT FOOT;  Surgeon: [name], MD;  Location: [facility];  Service: Orthopedics;  Laterality: Left;  . AMPUTATION Left 03/11/2017   Procedure: LEFT BELOW KNEE AMPUTATION;  Surgeon: [name], MD;  Location: [facility];  Service: Orthopedics;  Laterality: Left;  . AMPUTATION Right 03/11/2017   Procedure: RIGHT 2ND TOE AMPUTATION;  Surgeon: [name], MD;  Location: [place];  Service: Orthopedics;  Laterality: Right;  . AV FISTULA PLACEMENT  left arm  . CORONARY ARTERY BYPASS GRAFT N/A 11/04/2017   Procedure: CORONARY ARTERY BYPASS GRAFTING (CABG) x three, using left internal mammary artery and right    leg greater saphenous vein;  Surgeon: Melrose Nakayama, MD;  Location: [facility];  Service: Open Heart Surgery;  Laterality: N/A;  . LEFT HEART CATH AND CORONARY ANGIOGRAPHY N/A 10/31/2017   Procedure: LEFT HEART CATH AND CORONARY ANGIOGRAPHY;  Surgeon: Troy Sine, MD;  Location: [facility];  Service: Cardiovascular;  Laterality: N/A;  . LEFT HEART CATHETERIZATION WITH CORONARY ANGIOGRAM N/A 09/13/2014   Procedure: LEFT HEART CATHETERIZATION WITH CORONARY ANGIOGRAM;  Surgeon: Sinclair Grooms, MD;  Location: [facility] CATH LAB;  Service: Cardiovascular;  Laterality: N/A;  . LOWER EXTREMITY ANGIOGRAPHY Bilateral 11/02/2016   Procedure: Lower Extremity Angiography;  Surgeon: Waynetta Sandy, MD;  Location: [facility];  Service: Cardiovascular;  Laterality: Bilateral;  . PERIPHERAL VASCULAR ATHERECTOMY Left 11/02/2016   Procedure: Peripheral Vascular Atherectomy;  Surgeon: Waynetta Sandy, MD;  Location: [facility];  Service: Cardiovascular;  Laterality: Left;  PERONEAL  . STUMP REVISION Left 01/11/2017   Procedure:  Revision Left Transmetatarsal Amputation;  Surgeon: [name], MD;  Location: [facility];  Service: Orthopedics;  Laterality: Left;  . TEE WITHOUT CARDIOVERSION N/A 11/04/2017   Procedure: TRANSESOPHAGEAL ECHOCARDIOGRAM (TEE);  Surgeon: Melrose Nakayama, MD;  Location: [facility];  Service: Open Heart Surgery;  Laterality: N/A;        Home Medications    Prior to Admission medications   Medication Sig Start Date End Date Taking? Authorizing Provider  calcium acetate (PHOSLO) 667 MG capsule Take 3 capsules (2,001 mg total) by mouth 3 (three) times daily with meals. 03/17/17  Yes Rai, Ripudeep K, MD  carvedilol (COREG) 6.25 MG tablet Take 1 tablet (6.25 mg total) by mouth 2 (two) times daily with a meal. 03/17/17  Yes Rai, Ripudeep K, MD  clopidogrel (PLAVIX) 75 MG tablet Take 1 tablet (75 mg total) by mouth daily. 11/02/16  Yes Waynetta Sandy, MD  ipratropium-albuterol (DUONEB) 0.5-2.5 (3) MG/3ML SOLN Take 3 mLs by nebulization 3 (three) times daily as  needed (for shortness of breath).    Yes [provider]  lamoTRIgine (LAMICTAL) 150 MG tablet Take 150 mg by mouth at bedtime.   Yes [provider]  lanthanum (FOSRENOL) 1000 MG chewable tablet Chew 2 tablets (2,000 mg total) by mouth 3 (three) times daily with meals. 08/16/15  Yes Short, Noah Delaine, MD  acetaminophen (TYLENOL) 500 MG tablet Take 1,000 mg by mouth every 6 (six) hours as needed (for mild pain and CANNOT EXCEED 4,000 MG/24 HOURS).     [provider]  aspirin 325 MG EC tablet Take 1 tablet (325 mg total) by mouth daily. 11/09/17   Elgie Collard, PA-C  atorvastatin (LIPITOR) 80 MG tablet Take 1 tablet (80 mg total) by mouth daily at 6 PM. 11/09/17   Elgie Collard, PA-C  calcitRIOL (ROCALTROL) 0.25 MCG capsule Take 5 capsules (1.25 mcg total) by mouth every Monday, Wednesday, and Friday with hemodialysis. 11/09/17   Elgie Collard, PA-C  Darbepoetin Alfa (ARANESP) 60 MCG/0.3ML SOSY injection Inject 0.3  mLs (60 mcg total) into the vein every Tuesday with hemodialysis. 11/15/17   Elgie Collard, PA-C  docusate sodium (COLACE) 100 MG capsule Take 1 capsule (100 mg total) by mouth 2 (two) times daily. Patient taking differently: Take 100 mg by mouth at bedtime.  03/17/17   Rai, Ripudeep Raliegh Ip, MD  glucagon (GLUCAGON EMERGENCY) 1 MG injection Inject 1 mg into the vein once as needed (low blood sugar and inability to eat or drink sugar). 08/16/15   [name], MD  insulin aspart (NOVOLOG) 100 UNIT/ML injection Sliding scale CBG 70 - 120: 0 units CBG 121 - 150: 1 unit,  CBG 151 - 200: 2 units,  CBG 201 - 250: 3 units,  CBG 251 - 300: 5 units,  CBG 301 - 350: 7 units,  CBG 351 - 400: 9 units   CBG > 400: 9 units and notify your MD Patient taking differently: Inject 0-9 Units into the skin See admin instructions. Inject 0-9 units into the skin two times a day per sliding scale: BGL 70-120 = 0 units; 121-150 = 1 unit; 151-200 = 2 units; 201-250 = 3 units; 251-300 = 5 units; 301-350 = 7 units; 351-400 = 9 units; >400 = 9 units and call MD 03/17/17   Rai, Ripudeep K, MD  insulin glargine (LANTUS) 100 UNIT/ML injection Inject 0.07 mLs (7 Units total) into the skin at bedtime. 03/17/17   Rai, Vernelle Emerald, MD  methocarbamol (ROBAXIN) 500 MG tablet Take 1 tablet (500 mg total) by mouth every 6 (six) hours as needed for muscle spasms. 03/17/17   Rai, Vernelle Emerald, MD  Multiple Vitamins-Minerals (CERTA-VITE PO) Take 1 tablet by mouth daily.    [provider]  Nutritional Supplements (BOOST GLUCOSE CONTROL) LIQD Take 8 oz by mouth as directed.    [provider]  ondansetron (ZOFRAN) 4 MG tablet Take 4 mg by mouth every 6 (six) hours as needed for nausea.     [provider]  oxyCODONE (OXY IR/ROXICODONE) 5 MG immediate release tablet Take 1 tablet (5 mg total) by mouth every 6 (six) hours as needed for severe pain. 11/09/17   Elgie Collard, PA-C  polyethylene glycol (MIRALAX / GLYCOLAX) packet Take 17 g  by mouth daily. Patient taking differently: Take 17 g by mouth daily as needed for mild constipation.  03/17/17   Rai, Vernelle Emerald, MD  varenicline (CHANTIX) 0.5 MG tablet Take 0.5 mg by mouth daily.  [provider]    Family History Family History  Problem Relation Age of Onset  . Heart failure Mother   . Hypertension Mother     Social History Social History   Tobacco Use  . Smoking status: Current Every Day Smoker    Packs/day: 0.50    Years: 26.00    Pack years: 13.00    Types: Cigarettes  . Smokeless tobacco: Never Used  Substance Use Topics  . Alcohol use: Yes    Comment: 03/08/2017 "haven't took a drink in over 5 years"  . Drug use: Yes    Types: Marijuana    Comment: 03/08/2017 "basically q day"      Allergies   Coconut oil   Review of Systems Review of Systems  All other systems reviewed and are negative.    Physical Exam Updated Vital Signs BP (!) 173/88   Temp 98.2 F (36.8 C) (Oral)   Resp 14   Physical Exam  Constitutional: He is oriented to person, place, and time. He appears well-developed and well-nourished. No distress.  HENT:  Head: Normocephalic and atraumatic.  Mouth/Throat: Oropharynx is clear and moist.  Eyes: Pupils are equal, round, and reactive to light. Conjunctivae and EOM are normal.  Neck: Normal range of motion. Neck supple.  Cardiovascular: Normal rate, regular rhythm and intact distal pulses.  No murmur heard. Pulmonary/Chest: Effort normal and breath sounds normal. No respiratory distress. He has no wheezes. He has no rales. He exhibits tenderness.    Abdominal: Soft. He exhibits no distension. There is no tenderness. There is no rebound and no guarding.  Musculoskeletal: Normal range of motion. He exhibits no edema or tenderness.  Left BKA.  Right foot with multiple toe amputation.  Small area of bleeding that is dried and no open wounds  Neurological: He is alert and oriented to person, place, and time.  Skin:  Skin is warm and dry. No rash noted. No erythema.  Psychiatric: He has a normal mood and affect. His behavior is normal.  Nursing note and vitals reviewed.    ED Treatments / Results  Labs (all labs ordered are listed, but only abnormal results are displayed) Labs Reviewed  CBC WITH DIFFERENTIAL/PLATELET - Abnormal; Notable for the following components:      Result Value   Hemoglobin 11.7 (*)    HCT 37.2 (*)    MCH 25.9 (*)    RDW 18.5 (*)    Platelets 470 (*)    All other components within normal limits  BASIC METABOLIC PANEL - Abnormal; Notable for the following components:   Potassium 3.4 (*)    Chloride 93 (*)    Glucose, Bld 169 (*)    Creatinine, Ser 5.13 (*)    GFR calc non Af Amer 13 (*)    GFR calc Af Amer 15 (*)    All other components within normal limits    EKG None  Radiology No results found.  Procedures Procedures (including critical care time)  Medications Ordered in ED Medications - No data to display   Initial Impression / Assessment and Plan / ED Course  I have reviewed the triage vital signs and the nursing notes.  Pertinent labs & imaging results that were available during my care of the patient were reviewed by me and considered in my medical decision making (see chart for details).     42 year old male presenting today with concern for a new developing abscess under his distal incision on his chest.  Patient's  CABG was in March of this year.  He has had recurrent areas but never 1 directly under the incision.  He denies any systemic illness.  Last dialysis was today.  Patient had been getting vancomycin which finished about a week ago.  Patient does not appear systemically ill and vital signs are within normal limits except for hypertension.  There is no pointing or draining at this time.  Spoke with Dr. Mohammed Kindle with CT surgery who recommended patient follow-up tomorrow with Dr. Roxan Hockey for evaluation in the office and possible I&D.  Patient  given IV vancomycin loading dose here at 1500 mg.  He will then need to continue 750 mg after dialysis.  Findings discussed with the patient.  He is agreeable to this plan.  He was given some pain medication here but he does have oxycodone at [place] facility to use as needed.  CBC and BMP pending.  5:27 PM Labs are reassuring.  Patient received his vancomycin.  We will have him follow-up with Dr. Roxan Hockey tomorrow.  These details were communicated with [place] [facility] facility.  Final Clinical Impressions(s) / ED Diagnoses   Final diagnoses:  Abscess of chest wall    ED Discharge Orders    None       Blanchie Dessert, MD 01/09/18 1731

## 2018-01-09 NOTE — ED Notes (Signed)
Called [facility] for transportation back to [place]

## 2018-01-09 NOTE — Discharge Instructions (Signed)
[name] needs to see Dr. Roxan Hockey tomorrow.  When calling the office state he was seen in the emergency department today and Dr. Mohammed Kindle said that he should see Dr. Roxan Hockey tomorrow for possible drainage.  He did receive a dose of vancomycin in the emergency room which can continue with his dialysis.  He will need 750 mg after dialysis x4

## 2018-01-10 ENCOUNTER — Ambulatory Visit: Payer: Medicaid Other | Admitting: Thoracic Surgery (Cardiothoracic Vascular Surgery)

## 2018-01-10 ENCOUNTER — Telehealth: Payer: Self-pay

## 2018-01-10 NOTE — Telephone Encounter (Signed)
[name] with [facility] contacted the office this morning in regards to [name] being in the ED over the weekend and needing an appointment with Dr. Roxan Hockey today for a sternal wound check.  He stated that per the hospital he was to follow-up today.  I advised [name] to bring the patient in for an appointment at 11:15am.  She acknowledged receipt and stated she was going to arrange transportation for patient to and from facility.

## 2018-01-12 ENCOUNTER — Ambulatory Visit: Payer: Self-pay | Admitting: Thoracic Surgery (Cardiothoracic Vascular Surgery)

## 2018-01-13 ENCOUNTER — Other Ambulatory Visit: Payer: Self-pay

## 2018-01-13 ENCOUNTER — Ambulatory Visit (INDEPENDENT_AMBULATORY_CARE_PROVIDER_SITE_OTHER): Payer: Self-pay | Admitting: Thoracic Surgery (Cardiothoracic Vascular Surgery)

## 2018-01-13 ENCOUNTER — Encounter: Payer: Self-pay | Admitting: Thoracic Surgery (Cardiothoracic Vascular Surgery)

## 2018-01-13 VITALS — BP 150/80 | HR 87 | Resp 18 | Ht 74.0 in | Wt 163.1 lb

## 2018-01-13 DIAGNOSIS — T8149XA Infection following a procedure, other surgical site, initial encounter: Secondary | ICD-10-CM

## 2018-01-13 NOTE — Progress Notes (Signed)
[address]       [place] [address]             [id number]    HPI: [name] returns to have his sternal wound evaluated.  [name] is a 42 year old man with diabetes, hypertension, renal failure on hemodialysis, left BKA, combined systolic and diastolic heart failure, non-ST elevation MI, and severe three-vessel coronary disease.  He underwent coronary artery bypass grafting x3 on 11/04/2017.  I last saw him in the office on 12/20/2017.  He was doing well at that time but had some carbuncles on his chest near the incision.  He was given a dose of vancomycin on dialysis.  He was seen in the emergency room on 01/09/2018 with pain and tenderness along the inferior aspect of his sternal incision.  He did not have drainage at that time.  He did not have any systemic symptoms and was given intravenous vancomycin again.  In the interim since that visit he has had some drainage which relieved the pressure and pain from the area.  He has not had any ongoing drainage over the past 2 days.  Past Medical History:  Diagnosis Date  . Anemia   . Atherosclerosis of lower extremity ([place])   . CAD (coronary artery disease)   . Chronic combined systolic and diastolic heart failure ([place])   . Complication of anesthesia   . ESRD (end stage renal disease) on dialysis [facility])    "MWF [employer] [employer]" (03/08/2017)  . GERD (gastroesophageal reflux disease)   . Heart murmur   . History of blood transfusion    "related to OR"  . Hypertension   . PONV (postoperative nausea and vomiting)   . Type II diabetes mellitus ([place])     Current Outpatient Medications  Medication Sig Dispense Refill  . aspirin 325 MG EC tablet Take 1 tablet (325 mg total) by mouth daily. 30 tablet 0  . atorvastatin (LIPITOR) 80 MG tablet Take 1 tablet (80 mg total) by mouth daily at 6 PM. 30 tablet 1  . calcitRIOL (ROCALTROL) 0.25 MCG capsule Take 5 capsules (1.25 mcg total) by mouth every Monday, Wednesday,  and Friday with hemodialysis. 30 capsule 1  . calcium acetate (PHOSLO) 667 MG capsule Take 3 capsules (2,001 mg total) by mouth 3 (three) times daily with meals.    . carvedilol (COREG) 6.25 MG tablet Take 1 tablet (6.25 mg total) by mouth 2 (two) times daily with a meal.    . clopidogrel (PLAVIX) 75 MG tablet Take 1 tablet (75 mg total) by mouth daily. 30 tablet 3  . Darbepoetin Alfa (ARANESP) 60 MCG/0.3ML SOSY injection Inject 0.3 mLs (60 mcg total) into the vein every Tuesday with hemodialysis. 4.2 mL 1  . docusate sodium (COLACE) 100 MG capsule Take 1 capsule (100 mg total) by mouth 2 (two) times daily. (Patient taking differently: Take 100 mg by mouth at bedtime. ) 10 capsule 0  . glucagon (GLUCAGON EMERGENCY) 1 MG injection Inject 1 mg into the vein once as needed (low blood sugar and inability to eat or drink sugar). 1 each 0  . insulin aspart (NOVOLOG) 100 UNIT/ML injection Sliding scale CBG 70 - 120: 0 units CBG 121 - 150: 1 unit,  CBG 151 - 200: 2 units,  CBG 201 - 250: 3 units,  CBG 251 - 300: 5 units,  CBG 301 - 350: 7 units,  CBG 351 - 400: 9 units   CBG > 400:  9 units and notify your MD (Patient taking differently: Inject 0-9 Units into the skin See admin instructions. Inject 0-9 units into the skin two times a day per sliding scale: BGL 70-120 = 0 units; 121-150 = 1 unit; 151-200 = 2 units; 201-250 = 3 units; 251-300 = 5 units; 301-350 = 7 units; 351-400 = 9 units; >400 = 9 units and call MD) 10 mL 11  . insulin glargine (LANTUS) 100 UNIT/ML injection Inject 0.07 mLs (7 Units total) into the skin at bedtime. 10 mL 12  . ipratropium-albuterol (DUONEB) 0.5-2.5 (3) MG/3ML SOLN Take 3 mLs by nebulization 3 (three) times daily as needed (for shortness of breath).     . lamoTRIgine (LAMICTAL) 150 MG tablet Take 150 mg by mouth at bedtime.    Marland Kitchen lanthanum (FOSRENOL) 1000 MG chewable tablet Chew 2 tablets (2,000 mg total) by mouth 3 (three) times daily with meals. 180 tablet 0  . methocarbamol  (ROBAXIN) 500 MG tablet Take 1 tablet (500 mg total) by mouth every 6 (six) hours as needed for muscle spasms.    . Multiple Vitamins-Minerals (CERTA-VITE PO) Take 1 tablet by mouth daily.    . Nutritional Supplements (BOOST GLUCOSE CONTROL) LIQD Take 8 oz by mouth as directed.    . ondansetron (ZOFRAN) 4 MG tablet Take 4 mg by mouth every 6 (six) hours as needed for nausea.     Marland Kitchen oxyCODONE (OXY IR/ROXICODONE) 5 MG immediate release tablet Take 1 tablet (5 mg total) by mouth every 6 (six) hours as needed for severe pain. 30 tablet 0  . polyethylene glycol (MIRALAX / GLYCOLAX) packet Take 17 g by mouth daily. (Patient taking differently: Take 17 g by mouth daily as needed for mild constipation. ) 14 each 0  . vancomycin 750 mg in sodium chloride 0.9 % 150 mL Inject 750 mg into the vein every dialysis for up to 4 doses. 600 mL 0  . varenicline (CHANTIX) 0.5 MG tablet Take 0.5 mg by mouth daily.     No current facility-administered medications for this visit.     Physical Exam BP (!) 150/80 (BP Location: Right Arm, Patient Position: Sitting, Cuff Size: Normal)   Pulse 87   Resp 18   Ht 6\' 2"  (1.88 m)   Wt 163 lb 2.3 oz (74 kg)   SpO2 98% Comment: RA  BMI 20.32 kg/m  42 year old male in no acute distress Sternal incision with approximately 5 mm open area at the inferior aspect granulation tissue present when probed a small amount of purulent material was evacuated (less than 2 mL) sternum stable with no clicking or popping.  Multiple other similar sites -2 to the left side of the incision in 1 to the right.  Large carbuncle with no drainage at present.   Impression: [name] is a 42 year old gentleman with diabetes and renal failure with multiple carbuncles on his chest wall.  He has some drainage from the inferior aspect of his sternal wound consistent with a superficial sternal wound infection.  This area was probed with a Q-tip and a small amount of purulent material was expressed.  His  sternum is stable.  He has received intravenous vancomycin x2 most recently 4 days ago in the emergency room.  He needs local wound care with normal saline wet-to-dry dressing changes packing the inferior aspect of the sternal wound with quarter inch Nu Gauze.  We need to maintain the skin open so the purulent material can drain in the wound can heal  by secondary intention.  Plan: Normal saline wet-to-dry dressing changes daily  Return in 2 weeks for wound check  Melrose Nakayama, MD [facility] [phone]

## 2018-01-31 ENCOUNTER — Other Ambulatory Visit: Payer: Self-pay

## 2018-01-31 ENCOUNTER — Ambulatory Visit (INDEPENDENT_AMBULATORY_CARE_PROVIDER_SITE_OTHER): Payer: Self-pay | Admitting: Thoracic Surgery (Cardiothoracic Vascular Surgery)

## 2018-01-31 ENCOUNTER — Encounter: Payer: Self-pay | Admitting: Thoracic Surgery (Cardiothoracic Vascular Surgery)

## 2018-01-31 VITALS — BP 167/77 | HR 71 | Resp 16 | Ht 74.0 in | Wt 167.5 lb

## 2018-01-31 DIAGNOSIS — I25110 Atherosclerotic heart disease of native coronary artery with unstable angina pectoris: Secondary | ICD-10-CM

## 2018-01-31 DIAGNOSIS — T8149XA Infection following a procedure, other surgical site, initial encounter: Secondary | ICD-10-CM

## 2018-01-31 DIAGNOSIS — Z951 Presence of aortocoronary bypass graft: Secondary | ICD-10-CM

## 2018-01-31 NOTE — Progress Notes (Signed)
[address]       [place] [address]             [phone]       HPI:[name] returns for scheduled visit  [name] is a 43 year old gentleman with multiple medical problems including diabetes, hypertension, renal failure, left BKA, combined systolic and diastolic heart failure, non-ST elevation MI, and severe three-vessel coronary disease.  He underwent coronary artery bypass grafting on 11/04/2017.  He has had problems with recurrent skin infections.  When I saw him in the office on 12/20/2017 he had carbuncles near the incision.  He was given a dose of vancomycin on dialysis.  He presented to the emergency room on 01/09/2018 with pain and tenderness along the inferior aspect of his sternal incision.  He was given Vancomycin again.  I then saw him on the office on 01/13/2018.  His wound had opened up and there is been some serous drainage.  He was treated with saline wet-to-dry dressing changes.  In the interim since his last visit the drainage has resolved and the wound is nearly completely healed.  The carbuncles that were present on his chest are all better.  He now has noted a new one on the left side that feels a little tender.  Past Medical History:  Diagnosis Date  . Anemia   . Atherosclerosis of lower extremity ([place])   . CAD (coronary artery disease)   . Chronic combined systolic and diastolic heart failure ([place])   . Complication of anesthesia   . ESRD (end stage renal disease) on dialysis [facility])    "MWF [facility]" (03/08/2017)  . GERD (gastroesophageal reflux disease)   . Heart murmur   . History of blood transfusion    "related to OR"  . Hypertension   . PONV (postoperative nausea and vomiting)   . Type II diabetes mellitus ([place])     Current Outpatient Medications  Medication Sig Dispense Refill  . aspirin 325 MG EC tablet Take 1 tablet (325 mg total) by mouth daily. 30 tablet 0  . atorvastatin (LIPITOR) 80 MG tablet Take 1 tablet  (80 mg total) by mouth daily at 6 PM. 30 tablet 1  . calcitRIOL (ROCALTROL) 0.25 MCG capsule Take 5 capsules (1.25 mcg total) by mouth every Monday, Wednesday, and Friday with hemodialysis. 30 capsule 1  . calcium acetate (PHOSLO) 667 MG capsule Take 3 capsules (2,001 mg total) by mouth 3 (three) times daily with meals.    . carvedilol (COREG) 6.25 MG tablet Take 1 tablet (6.25 mg total) by mouth 2 (two) times daily with a meal.    . clopidogrel (PLAVIX) 75 MG tablet Take 1 tablet (75 mg total) by mouth daily. 30 tablet 3  . Darbepoetin Alfa (ARANESP) 60 MCG/0.3ML SOSY injection Inject 0.3 mLs (60 mcg total) into the vein every Tuesday with hemodialysis. 4.2 mL 1  . docusate sodium (COLACE) 100 MG capsule Take 1 capsule (100 mg total) by mouth 2 (two) times daily. (Patient taking differently: Take 100 mg by mouth at bedtime. ) 10 capsule 0  . glucagon (GLUCAGON EMERGENCY) 1 MG injection Inject 1 mg into the vein once as needed (low blood sugar and inability to eat or drink sugar). 1 each 0  . insulin aspart (NOVOLOG) 100 UNIT/ML injection Sliding scale CBG 70 - 120: 0 units CBG 121 - 150: 1 unit,  CBG 151 - 200: 2 units,  CBG 201 - 250: 3 units,  CBG 251 - 300: 5 units,  CBG 301 - 350: 7 units,  CBG 351 - 400: 9 units   CBG > 400: 9 units and notify your MD (Patient taking differently: Inject 0-9 Units into the skin See admin instructions. Inject 0-9 units into the skin two times a day per sliding scale: BGL 70-120 = 0 units; 121-150 = 1 unit; 151-200 = 2 units; 201-250 = 3 units; 251-300 = 5 units; 301-350 = 7 units; 351-400 = 9 units; >400 = 9 units and call MD) 10 mL 11  . insulin glargine (LANTUS) 100 UNIT/ML injection Inject 0.07 mLs (7 Units total) into the skin at bedtime. 10 mL 12  . ipratropium-albuterol (DUONEB) 0.5-2.5 (3) MG/3ML SOLN Take 3 mLs by nebulization 3 (three) times daily as needed (for shortness of breath).     . lamoTRIgine (LAMICTAL) 150 MG tablet Take 150 mg by mouth at bedtime.     Marland Kitchen lanthanum (FOSRENOL) 1000 MG chewable tablet Chew 2 tablets (2,000 mg total) by mouth 3 (three) times daily with meals. 180 tablet 0  . methocarbamol (ROBAXIN) 500 MG tablet Take 1 tablet (500 mg total) by mouth every 6 (six) hours as needed for muscle spasms.    . Multiple Vitamins-Minerals (CERTA-VITE PO) Take 1 tablet by mouth daily.    . Nutritional Supplements (BOOST GLUCOSE CONTROL) LIQD Take 8 oz by mouth as directed.    . ondansetron (ZOFRAN) 4 MG tablet Take 4 mg by mouth every 6 (six) hours as needed for nausea.     Marland Kitchen oxyCODONE (OXY IR/ROXICODONE) 5 MG immediate release tablet Take 1 tablet (5 mg total) by mouth every 6 (six) hours as needed for severe pain. 30 tablet 0  . polyethylene glycol (MIRALAX / GLYCOLAX) packet Take 17 g by mouth daily. (Patient taking differently: Take 17 g by mouth daily as needed for mild constipation. ) 14 each 0  . varenicline (CHANTIX) 0.5 MG tablet Take 0.5 mg by mouth daily.     No current facility-administered medications for this visit.     Physical Exam BP (!) 167/77 (BP Location: Right Arm, Patient Position: Sitting, Cuff Size: Normal)   Pulse 71   Resp 16   Ht 6\' 2"  (1.88 m)   Wt 167 lb 8.8 oz (76 kg)   SpO2 98% Comment: ON RA  BMI 21.76 kg/m  42 year old man in no acute distress Alert and oriented x3 with no focal deficits Cardiac regular rate and rhythm Sternal incision clean dry and intact and well-healed 6 mm raised, mildly erythematous area left upper anterior chest  Impression: [name] is a 42 year old man with multiple medical problems including diabetes, hypertension, renal failure on hemodialysis, peripheral vascular disease, left BKA, coronary artery disease, status post CABG x3 November 04, 2017.  He did well initially postoperatively but developed a late superficial sternal wound infection secondary to an adjacent carbuncle.  That has cleared up with intravenous vancomycin and local wound care.  He does not have any  incisional pain.  He does have some phantom pain in his left leg.  Plan: Follow-up with cardiology.  I would be happy to see him back again at anytime if I can be of any further assistance with his care  Melrose Nakayama, MD [facility] [phone]

## 2018-02-07 ENCOUNTER — Telehealth: Payer: Self-pay | Admitting: *Deleted

## 2018-02-07 ENCOUNTER — Ambulatory Visit (INDEPENDENT_AMBULATORY_CARE_PROVIDER_SITE_OTHER): Payer: Medicaid Other | Admitting: Nurse Practitioner

## 2018-02-07 ENCOUNTER — Encounter: Payer: Self-pay | Admitting: Nurse Practitioner

## 2018-02-07 VITALS — BP 120/60 | HR 79 | Ht 74.0 in | Wt 170.4 lb

## 2018-02-07 DIAGNOSIS — E1065 Type 1 diabetes mellitus with hyperglycemia: Secondary | ICD-10-CM

## 2018-02-07 DIAGNOSIS — I259 Chronic ischemic heart disease, unspecified: Secondary | ICD-10-CM

## 2018-02-07 DIAGNOSIS — Z951 Presence of aortocoronary bypass graft: Secondary | ICD-10-CM

## 2018-02-07 DIAGNOSIS — E7849 Other hyperlipidemia: Secondary | ICD-10-CM

## 2018-02-07 MED ORDER — ASPIRIN EC 81 MG PO TBEC: 81.0000 mg | DELAYED_RELEASE_TABLET | Freq: Every day | ORAL | 11 refills | Status: AC

## 2018-02-07 MED ORDER — CLOPIDOGREL BISULFATE 75 MG PO TABS: 75.0000 mg | ORAL_TABLET | Freq: Every day | ORAL | 6 refills | Status: DC

## 2018-02-07 MED ORDER — CARVEDILOL 6.25 MG PO TABS: 6.2500 mg | ORAL_TABLET | Freq: Two times a day (BID) | ORAL | 11 refills | Status: DC

## 2018-02-07 MED ORDER — ATORVASTATIN CALCIUM 80 MG PO TABS
80.0000 mg | ORAL_TABLET | Freq: Every day | ORAL | 11 refills | Status: DC
Start: 2018-02-07 — End: 2018-08-17

## 2018-02-07 NOTE — Progress Notes (Signed)
CARDIOLOGY OFFICE NOTE  Date:  02/07/2018    [name] Labella Sr. Date of Birth: February 28, 1976 Medical Record #[id number]  PCP:  Patient, [name] Per  Cardiologist:  Johnsie Cancel  Chief Complaint  Patient presents with  . Coronary Artery Disease    Post hospital follow up - seen for Dr. Johnsie Cancel    History of Present Illness: [name] Dohse Sr. is a 42 y.o. male who presents today for a delayed post hospital visit. Seen for Dr. Johnsie Cancel.   He has a history of ESRD on dialysis. Other issues include HTN, CAD, combined chronic systolic & diastolic HF, type 1 diabetes complicated by nephropathy, neuropathy and vasculopathy with prior left BKA due to PAD and non compliance.   He had not been seen in this office since 2016.   In March he presented to the hospital with chest pain. Troponin mildly elevated. He underwent cardiac catheterization and was noted to have severe multivessel coronary artery disease. CABG was recommended. He underwent a CABG x 3 on 11/04/2017.  His post op course was uneventful. A1C was over 12. He required discharge to SNF. He did have issues with recurrent skin infections - requiring vancomycin and dressing changes.   Seen last week by Dr. Roxan Hockey but with new carbuncle on the left side of the chest. Felt to be stable. Cardiology appointment was arranged (had missed prior visits). He was released from [facility].     Comes in today. Here alone. In a wheelchair.  He says he is doing ok. He is planning on moving into a handicap apartment next week - sounds like he has a case manager who is arranging. He says his sisters will help with his care and he may have some outside help arranged as well. He does not have primary care established. He is smoking a few cigarettes a day - he is on Chantix. Overall, he says he feels better. Some walking with his prosthesis. No chest pain. Still has a dressing in place over the lower end of his sternum (but it has healed). Not  short of breath.    Past Medical History:  Diagnosis Date  . Anemia   . Atherosclerosis of lower extremity ([place])   . CAD (coronary artery disease)   . Chronic combined systolic and diastolic heart failure ([place])   . Complication of anesthesia   . ESRD (end stage renal disease) on dialysis [facility])    "MWF [facility]" (03/08/2017)  . GERD (gastroesophageal reflux disease)   . Heart murmur   . History of blood transfusion    "related to OR"  . Hypertension   . PONV (postoperative nausea and vomiting)   . Type II diabetes mellitus ([place])     Past Surgical History:  Procedure Laterality Date  . ABDOMINAL AORTOGRAM N/A 11/02/2016   Procedure: Abdominal Aortogram;  Surgeon: Waynetta Sandy, MD;  Location: [place] [facility];  Service: Cardiovascular;  Laterality: N/A;  . AMPUTATION Left 09/27/2013   Procedure: LEFT GREAT TOE AMPUTATION;  Surgeon: [name], MD;  Location: [place];  Service: Orthopedics;  Laterality: Left;  . AMPUTATION Right 08/15/2015   Procedure: Right Great Toe Amputation;  Surgeon: [name], MD;  Location: [place];  Service: Orthopedics;  Laterality: Right;  . AMPUTATION Left 11/05/2016   Procedure: TRANSMETATARSAL AMPUTATION LEFT FOOT;  Surgeon: [name], MD;  Location: [place];  Service: Orthopedics;  Laterality: Left;  . AMPUTATION Left 03/11/2017   Procedure: LEFT  BELOW KNEE AMPUTATION;  Surgeon: [name], MD;  Location: [place];  Service: Orthopedics;  Laterality: Left;  . AMPUTATION Right 03/11/2017   Procedure: RIGHT 2ND TOE AMPUTATION;  Surgeon: [name], MD;  Location: [place];  Service: Orthopedics;  Laterality: Right;  . AV FISTULA PLACEMENT  left arm  . CORONARY ARTERY BYPASS GRAFT N/A 11/04/2017   Procedure: CORONARY ARTERY BYPASS GRAFTING (CABG) x three, using left internal mammary artery and right    leg greater saphenous vein;  Surgeon: Melrose Nakayama, MD;  Location: [place];  Service: Open Heart Surgery;  Laterality: N/A;  .  LEFT HEART CATH AND CORONARY ANGIOGRAPHY N/A 10/31/2017   Procedure: LEFT HEART CATH AND CORONARY ANGIOGRAPHY;  Surgeon: Troy Sine, MD;  Location: [place] [facility];  Service: Cardiovascular;  Laterality: N/A;  . LEFT HEART CATHETERIZATION WITH CORONARY ANGIOGRAM N/A 09/13/2014   Procedure: LEFT HEART CATHETERIZATION WITH CORONARY ANGIOGRAM;  Surgeon: Sinclair Grooms, MD;  Location: [facility];  Service: Cardiovascular;  Laterality: N/A;  . LOWER EXTREMITY ANGIOGRAPHY Bilateral 11/02/2016   Procedure: Lower Extremity Angiography;  Surgeon: Waynetta Sandy, MD;  Location: [place] [facility];  Service: Cardiovascular;  Laterality: Bilateral;  . PERIPHERAL VASCULAR ATHERECTOMY Left 11/02/2016   Procedure: Peripheral Vascular Atherectomy;  Surgeon: Waynetta Sandy, MD;  Location: [place] [facility];  Service: Cardiovascular;  Laterality: Left;  PERONEAL  . STUMP REVISION Left 01/11/2017   Procedure: Revision Left Transmetatarsal Amputation;  Surgeon: [name], MD;  Location: [place];  Service: Orthopedics;  Laterality: Left;  . TEE WITHOUT CARDIOVERSION N/A 11/04/2017   Procedure: TRANSESOPHAGEAL ECHOCARDIOGRAM (TEE);  Surgeon: Melrose Nakayama, MD;  Location: [place];  Service: Open Heart Surgery;  Laterality: N/A;     Medications: Current Meds  Medication Sig  . atorvastatin (LIPITOR) 80 MG tablet Take 1 tablet (80 mg total) by mouth daily at 6 PM.  . calcitRIOL (ROCALTROL) 0.25 MCG capsule Take 5 capsules (1.25 mcg total) by mouth every Monday, Wednesday, and Friday with hemodialysis.  Marland Kitchen calcium acetate (PHOSLO) 667 MG capsule Take 3 capsules (2,001 mg total) by mouth 3 (three) times daily with meals.  . carvedilol (COREG) 6.25 MG tablet Take 1 tablet (6.25 mg total) by mouth 2 (two) times daily with a meal.  . clopidogrel (PLAVIX) 75 MG tablet Take 1 tablet (75 mg total) by mouth daily.  . Darbepoetin Alfa (ARANESP) 60 MCG/0.3ML SOSY injection Inject 0.3 mLs (60  mcg total) into the vein every Tuesday with hemodialysis.  Marland Kitchen docusate sodium (COLACE) 100 MG capsule Take 100 mg by mouth daily.  Marland Kitchen glucagon (GLUCAGON EMERGENCY) 1 MG injection Inject 1 mg into the vein once as needed (low blood sugar and inability to eat or drink sugar).  . insulin aspart (NOVOLOG) 100 UNIT/ML injection Sliding scale CBG 70 - 120: 0 units CBG 121 - 150: 1 unit,  CBG 151 - 200: 2 units,  CBG 201 - 250: 3 units,  CBG 251 - 300: 5 units,  CBG 301 - 350: 7 units,  CBG 351 - 400: 9 units   CBG > 400: 9 units and notify your MD (Patient taking differently: Inject 0-9 Units into the skin See admin instructions. Inject 0-9 units into the skin two times a day per sliding scale: BGL 70-120 = 0 units; 121-150 = 1 unit; 151-200 = 2 units; 201-250 = 3 units; 251-300 = 5 units; 301-350 = 7 units; 351-400 = 9  units; >400 = 9 units and call MD)  . insulin glargine (LANTUS) 100 UNIT/ML injection Inject 0.07 mLs (7 Units total) into the skin at bedtime.  Marland Kitchen ipratropium-albuterol (DUONEB) 0.5-2.5 (3) MG/3ML SOLN Take 3 mLs by nebulization 3 (three) times daily as needed (for shortness of breath).   . lamoTRIgine (LAMICTAL) 150 MG tablet Take 150 mg by mouth at bedtime.  Marland Kitchen lanthanum (FOSRENOL) 1000 MG chewable tablet Chew 2 tablets (2,000 mg total) by mouth 3 (three) times daily with meals.  . methocarbamol (ROBAXIN) 500 MG tablet Take 1 tablet (500 mg total) by mouth every 6 (six) hours as needed for muscle spasms.  . Multiple Vitamins-Minerals (CERTA-VITE PO) Take 1 tablet by mouth daily.  . Nutritional Supplements (BOOST GLUCOSE CONTROL) LIQD Take 8 oz by mouth as directed.  . ondansetron (ZOFRAN) 4 MG tablet Take 4 mg by mouth every 6 (six) hours as needed for nausea.   . polyethylene glycol (MIRALAX / GLYCOLAX) packet Take 17 g by mouth daily.  . varenicline (CHANTIX) 0.5 MG tablet Take 0.5 mg by mouth daily.  . [DISCONTINUED] aspirin 325 MG EC tablet Take 1 tablet (325 mg total) by mouth daily.  .  [DISCONTINUED] atorvastatin (LIPITOR) 80 MG tablet Take 1 tablet (80 mg total) by mouth daily at 6 PM.  . [DISCONTINUED] carvedilol (COREG) 6.25 MG tablet Take 1 tablet (6.25 mg total) by mouth 2 (two) times daily with a meal.  . [DISCONTINUED] clopidogrel (PLAVIX) 75 MG tablet Take 1 tablet (75 mg total) by mouth daily.  . [DISCONTINUED] oxyCODONE (OXY IR/ROXICODONE) 5 MG immediate release tablet Take 1 tablet (5 mg total) by mouth every 6 (six) hours as needed for severe pain.     Allergies: Allergies  Allergen Reactions  . Coconut Oil Anaphylaxis    Can use topically, allergic to coconut foods     Social History: The patient  reports that he has been smoking cigarettes.  He has a 13.00 pack-year smoking history. He has never used smokeless tobacco. He reports that he drinks alcohol. He reports that he has current or past drug history. Drug: Marijuana.   Family History: The patient's family history includes Heart failure in his mother; Hypertension in his mother.   Review of Systems: Please see the history of present illness.   Otherwise, the review of systems is positive for none.   All other systems are reviewed and negative.   Physical Exam: VS:  BP 120/60 (BP Location: Right Arm, Patient Position: Sitting, Cuff Size: Normal)   Pulse 79   Ht 6\' 2"  (1.88 m)   Wt 170 lb 6.4 oz (77.3 kg)   BMI 21.88 kg/m  .  BMI Body mass index is 21.88 kg/m.  Wt Readings from Last 3 Encounters:  02/07/18 170 lb 6.4 oz (77.3 kg)  01/31/18 167 lb 8.8 oz (76 kg)  01/13/18 163 lb 2.3 oz (74 kg)    General: Pleasant. Alert and in no acute distress. He is in a wheelchair. He is not able to walk.  HEENT: Normal.  Neck: Supple, no JVD, carotid bruits, or masses noted.  Cardiac: Regular rate and rhythm. No murmurs, rubs, or gallops. His actual sternal incision looks fine - the dressing is removed - no need to replace. Left BKA. No edema on the right.  Respiratory:  Lungs are clear to auscultation  bilaterally with normal work of breathing.  GI: Soft and nontender.  MS: No deformity or atrophy. Gait not tested. Skin: Warm and dry.  Color is normal.  Neuro:  Strength and sensation are intact and no gross focal deficits noted.  Psych: Alert, appropriate and with normal affect.   LABORATORY DATA:  EKG:  EKG is ordered today. This demonstrates NSR with diffuse ST and T wave changes.  Lab Results  Component Value Date   WBC 9.2 01/09/2018   HGB 11.7 (L) 01/09/2018   HCT 37.2 (L) 01/09/2018   PLT 470 (H) 01/09/2018   GLUCOSE 169 (H) 01/09/2018   CHOL 187 10/29/2017   TRIG 266 (H) 10/29/2017   HDL 35 (L) 10/29/2017   LDLCALC 99 10/29/2017   ALT 39 11/03/2017   AST 35 11/03/2017   NA 137 01/09/2018   K 3.4 (L) 01/09/2018   CL 93 (L) 01/09/2018   CREATININE 5.13 (H) 01/09/2018   BUN 9 01/09/2018   CO2 31 01/09/2018   TSH 1.011 10/29/2017   INR 1.25 11/04/2017   HGBA1C 12.1 (H) 10/29/2017     BNP (last 3 results) Recent Labs    06/04/17 1000  BNP >4,500.0*    ProBNP (last 3 results) No results for input(s): PROBNP in the last 8760 hours.   Other Studies Reviewed Today:  LEFT HEART CATH AND CORONARY ANGIOGRAPHY by Dr. Claiborne Billings on 03/182019:  Conclusion     Ost 2nd Diag to 2nd Diag lesion is 99% stenosed.  Prox LAD to Mid LAD lesion is 99% stenosed.  Ost 2nd Mrg to 2nd Mrg lesion is 80% stenosed.  Mid Cx to Dist Cx lesion is 100% stenosed.  Ost RCA to Prox RCA lesion is 20% stenosed.  Mid RCA lesion is 40% stenosed.  Dist RCA-1 lesion is 90% stenosed.  Dist RCA-2 lesion is 80% stenosed.  The left ventricular systolic function is normal.  LV end diastolic pressure is normal.  Severe multivessel CAD with coronary calcification involving all major coronary arteries. There is 99% subtotal occlusion of a small second diagonal branch of the LAD. The mid LAD is very tortuous and has diffuse 95% nodular very calcified stenoses in the mid segment; the  circumflex vessel has 80% marginal stenosis and is occluded after this marginal takeoff with collateralization retrograde distally; and a large dominant calcified RCA with 20% proximal 40% mid, and focal 90 and 80% stenoses after the acute margin and before the PDA takeoff.  Mild LV dysfunction with an ejection fraction of 40 to less than 45% with distal anterolateral apical hypocontractility. LVEDP 5-7 mm.  RECOMMENDATION: With the extensive coronary calcification and multivessel CAD recommend surgical consultation for CABG revascularization.   Procedure Performed: Median sternotomy, extracorporeal circulation, coronary artery bypass grafting x3 (left internal mammary artery to left anterior descending, saphenous vein graft to obtuse marginal 1, saphenous vein graft to posterior descending) by Dr. Roxan Hockey on 11/04/2017.  Echo Study Conclusions 10/2017  - Left ventricle: The cavity size was normal. Wall thickness was   increased in a pattern of moderate to severe LVH. Systolic   function was normal. The estimated ejection fraction was in the   range of 50% to 55%. Wall motion was normal; there were no   regional wall motion abnormalities. Doppler parameters are   consistent with abnormal left ventricular relaxation (grade 1   diastolic dysfunction). - Mitral valve: Moderately calcified annulus.    Assessment/Plan: 1. Multivessel CAD - prior CABG x 3 from March of 2019. Doing ok clinically. No active symptoms. Would favor CV risk factor modification as he is able. Total smoking cessation encouraged. His wound has healed -  no need to place a dressing - this was removed today. He has been released by Dr. Roxan Hockey. He is on chronic Plavix as well - this will be continued.   2. Uncontrolled Dm - does not have a PCP - information given today to arrange.   3. ESRD - on HD - no problems noted.   4. Non compliance - worrisome that this will be recurring theme as he leaves the  facility.   5. PAD - with prior BKA  6. HTN - BP ok here today. No changes made.   7. HLD - on high dose statin - needs labs today.   8. Tobacco abuse - smoking less - on Chantix - total cessation encouraged.   9. Prior combined systolic/diastolic HF - most recent echo with normal EF. No signs of volume overload noted nor shortness of breath.   Current medicines are reviewed with the patient today.  The patient does not have concerns regarding medicines other than what has been noted above.  The following changes have been made:  See above.  Labs/ tests ordered today include:    Orders Placed This Encounter  Procedures  . Basic metabolic panel  . CBC  . Hepatic function panel  . Lipid panel  . Hemoglobin A1c  . EKG 12-Lead     Disposition:   FU with Dr. Johnsie Cancel in 3 months.   Patient is agreeable to this plan and will call if any problems develop in the interim.   SignedTruitt Merle, NP  02/07/2018 9:19 AM  [place] [address] [place], [place]  [address] Phone: [phone] Fax: [phone]

## 2018-02-07 NOTE — Patient Instructions (Addendum)
We will be checking the following labs today - BMET, CBC, HPF, Lipids and A1C   Medication Instructions:    Continue with your current medicines. BUT  Decreasing aspirin to 81 mg a day    Testing/Procedures To Be Arranged:  N/A  Follow-Up:   See Dr. Johnsie Cancel in about 3 months    Other Special Instructions:   Try to stop smoking altogether    If you need a refill on your cardiac medications before your next appointment, please call your pharmacy.   Call the [place] office at [phone] if you have any questions, problems or concerns.

## 2018-02-07 NOTE — Telephone Encounter (Signed)
Faxed to [facility] @ [phone] phone # [phone].

## 2018-02-08 LAB — BASIC METABOLIC PANEL
BUN/Creatinine Ratio: 4 — ABNORMAL LOW (ref 9–20)
BUN: 37 mg/dL — ABNORMAL HIGH (ref 6–24)
CO2: 23 mmol/L (ref 20–29)
Calcium: 9.9 mg/dL (ref 8.7–10.2)
Chloride: 87 mmol/L — ABNORMAL LOW (ref 96–106)
Creatinine, Ser: 8.64 mg/dL — ABNORMAL HIGH (ref 0.76–1.27)
GFR calc Af Amer: 8 mL/min/{1.73_m2} — ABNORMAL LOW (ref >59)
GFR calc non Af Amer: 7 mL/min/{1.73_m2} — ABNORMAL LOW (ref >59)
Glucose: 284 mg/dL — ABNORMAL HIGH (ref 65–99)
Potassium: 5 mmol/L (ref 3.5–5.2)
Sodium: 133 mmol/L — ABNORMAL LOW (ref 134–144)

## 2018-02-08 LAB — CBC
Hematocrit: 42.8 % (ref 37.5–51.0)
Hemoglobin: 14.3 g/dL (ref 13.0–17.7)
MCH: 27.5 pg (ref 26.6–33.0)
MCHC: 33.4 g/dL (ref 31.5–35.7)
MCV: 82 fL (ref 79–97)
Platelets: 369 10*3/uL (ref 150–450)
RBC: 5.2 x10E6/uL (ref 4.14–5.80)
RDW: 21.5 % — ABNORMAL HIGH (ref 12.3–15.4)
WBC: 5.2 10*3/uL (ref 3.4–10.8)

## 2018-02-08 LAB — HEPATIC FUNCTION PANEL
ALT: 29 IU/L (ref 0–44)
AST: 19 IU/L (ref 0–40)
Albumin: 4.5 g/dL (ref 3.5–5.5)
Alkaline Phosphatase: 103 IU/L (ref 39–117)
Bilirubin Total: 0.4 mg/dL (ref 0.0–1.2)
Bilirubin, Direct: 0.14 mg/dL (ref 0.00–0.40)
Total Protein: 7.8 g/dL (ref 6.0–8.5)

## 2018-02-08 LAB — HEMOGLOBIN A1C
Est. average glucose Bld gHb Est-mCnc: 177 mg/dL
Hgb A1c MFr Bld: 7.8 % — ABNORMAL HIGH (ref 4.8–5.6)

## 2018-02-08 LAB — LIPID PANEL
Chol/HDL Ratio: 4.5 ratio (ref 0.0–5.0)
Cholesterol, Total: 171 mg/dL (ref 100–199)
HDL: 38 mg/dL — ABNORMAL LOW (ref >39)
LDL Calculated: 83 mg/dL (ref 0–99)
Triglycerides: 249 mg/dL — ABNORMAL HIGH (ref 0–149)
VLDL Cholesterol Cal: 50 mg/dL — ABNORMAL HIGH (ref 5–40)

## 2018-03-11 ENCOUNTER — Emergency Department (HOSPITAL_COMMUNITY)
Admission: EM | Admit: 2018-03-11 | Discharge: 2018-03-12 | Disposition: A | Discharge status: Home / Self Care | Payer: Medicaid Other | Attending: Emergency Medicine | Admitting: Emergency Medicine

## 2018-03-11 DIAGNOSIS — Z992 Dependence on renal dialysis: Secondary | ICD-10-CM | POA: Diagnosis not present

## 2018-03-11 DIAGNOSIS — N186 End stage renal disease: Secondary | ICD-10-CM | POA: Insufficient documentation

## 2018-03-11 DIAGNOSIS — I132 Hypertensive heart and chronic kidney disease with heart failure and with stage 5 chronic kidney disease, or end stage renal disease: Secondary | ICD-10-CM | POA: Diagnosis not present

## 2018-03-11 DIAGNOSIS — Z7982 Long term (current) use of aspirin: Secondary | ICD-10-CM | POA: Insufficient documentation

## 2018-03-11 DIAGNOSIS — I5042 Chronic combined systolic (congestive) and diastolic (congestive) heart failure: Secondary | ICD-10-CM | POA: Diagnosis not present

## 2018-03-11 DIAGNOSIS — F17210 Nicotine dependence, cigarettes, uncomplicated: Secondary | ICD-10-CM | POA: Insufficient documentation

## 2018-03-11 DIAGNOSIS — Z7902 Long term (current) use of antithrombotics/antiplatelets: Secondary | ICD-10-CM | POA: Insufficient documentation

## 2018-03-11 DIAGNOSIS — L02412 Cutaneous abscess of left axilla: Secondary | ICD-10-CM | POA: Diagnosis not present

## 2018-03-11 DIAGNOSIS — L02214 Cutaneous abscess of groin: Secondary | ICD-10-CM | POA: Insufficient documentation

## 2018-03-11 DIAGNOSIS — R739 Hyperglycemia, unspecified: Secondary | ICD-10-CM

## 2018-03-11 DIAGNOSIS — Z794 Long term (current) use of insulin: Secondary | ICD-10-CM | POA: Diagnosis not present

## 2018-03-11 DIAGNOSIS — E1165 Type 2 diabetes mellitus with hyperglycemia: Secondary | ICD-10-CM | POA: Diagnosis not present

## 2018-03-11 DIAGNOSIS — Z79899 Other long term (current) drug therapy: Secondary | ICD-10-CM | POA: Diagnosis not present

## 2018-03-11 DIAGNOSIS — L0291 Cutaneous abscess, unspecified: Secondary | ICD-10-CM

## 2018-03-11 DIAGNOSIS — E1122 Type 2 diabetes mellitus with diabetic chronic kidney disease: Secondary | ICD-10-CM | POA: Diagnosis not present

## 2018-03-11 NOTE — ED Triage Notes (Signed)
Pt from [facility], per EMS called b/c of painful abscess under his left axillary however he also has one on his groin.  Pt also was found to be hyperglycemic w/ CBG of 457 and Hypertensive at 200/78.  Pt is a dialysis pt, MWF and reports Friday's treatment went as planned.

## 2018-03-12 ENCOUNTER — Encounter (HOSPITAL_COMMUNITY): Payer: Self-pay | Admitting: Emergency Medicine

## 2018-03-12 ENCOUNTER — Emergency Department (HOSPITAL_COMMUNITY): Payer: Medicaid Other

## 2018-03-12 LAB — CBC
HCT: 46.2 % (ref 39.0–52.0)
HEMOGLOBIN: 14.5 g/dL (ref 13.0–17.0)
MCH: 27.1 pg (ref 26.0–34.0)
MCHC: 31.4 g/dL (ref 30.0–36.0)
MCV: 86.2 fL (ref 78.0–100.0)
Platelets: 307 10*3/uL (ref 150–400)
RBC: 5.36 MIL/uL (ref 4.22–5.81)
RDW: 19.2 % — AB (ref 11.5–15.5)
WBC: 10.3 10*3/uL (ref 4.0–10.5)

## 2018-03-12 LAB — I-STAT CG4 LACTIC ACID, ED
LACTIC ACID, VENOUS: 0.68 mmol/L (ref 0.5–1.9)
LACTIC ACID, VENOUS: 2.57 mmol/L — AB (ref 0.5–1.9)
Lactic Acid, Venous: 2.06 mmol/L (ref 0.5–1.9)

## 2018-03-12 LAB — BASIC METABOLIC PANEL
Anion gap: 16 — ABNORMAL HIGH (ref 5–15)
BUN: 50 mg/dL — ABNORMAL HIGH (ref 6–20)
CALCIUM: 9.7 mg/dL (ref 8.9–10.3)
CO2: 27 mmol/L (ref 22–32)
Chloride: 88 mmol/L — ABNORMAL LOW (ref 98–111)
Creatinine, Ser: 10.62 mg/dL — ABNORMAL HIGH (ref 0.61–1.24)
GFR calc Af Amer: 6 mL/min — ABNORMAL LOW (ref >60)
GFR, EST NON AFRICAN AMERICAN: 5 mL/min — AB (ref >60)
GLUCOSE: 423 mg/dL — AB (ref 70–99)
Potassium: 4.6 mmol/L (ref 3.5–5.1)
SODIUM: 131 mmol/L — AB (ref 135–145)

## 2018-03-12 LAB — I-STAT TROPONIN, ED
Troponin i, poc: 0.07 ng/mL (ref 0.00–0.08)
Troponin i, poc: 0.05 ng/mL (ref 0.00–0.08)

## 2018-03-12 LAB — CBG MONITORING, ED
GLUCOSE-CAPILLARY: 410 mg/dL — AB (ref 70–99)
Glucose-Capillary: 292 mg/dL — ABNORMAL HIGH (ref 70–99)

## 2018-03-12 MED ORDER — SODIUM CHLORIDE 0.9 % IV BOLUS
500.0000 mL | Freq: Once | INTRAVENOUS | Status: AC
  Administered 2018-03-12: 500 mL via INTRAVENOUS

## 2018-03-12 MED ORDER — CARVEDILOL 12.5 MG PO TABS
6.2500 mg | ORAL_TABLET | Freq: Once | ORAL | Status: AC
  Administered 2018-03-12: 6.25 mg via ORAL
  Filled 2018-03-12: qty 1

## 2018-03-12 MED ORDER — CLINDAMYCIN HCL 150 MG PO CAPS
450.0000 mg | ORAL_CAPSULE | Freq: Once | ORAL | Status: AC
  Administered 2018-03-12: 450 mg via ORAL
  Filled 2018-03-12: qty 3

## 2018-03-12 MED ORDER — CARVEDILOL 12.5 MG PO TABS: 6.2500 mg | ORAL_TABLET | Freq: Two times a day (BID) | ORAL | Status: DC

## 2018-03-12 MED ORDER — CLINDAMYCIN HCL 150 MG PO CAPS: 450.0000 mg | ORAL_CAPSULE | Freq: Three times a day (TID) | ORAL | 0 refills | Status: AC

## 2018-03-12 MED ORDER — MORPHINE SULFATE (PF) 4 MG/ML IV SOLN
4.0000 mg | Freq: Once | INTRAVENOUS | Status: AC
  Administered 2018-03-12: 4 mg via INTRAVENOUS
  Filled 2018-03-12: qty 1

## 2018-03-12 MED ORDER — LIDOCAINE HCL (PF) 1 % IJ SOLN
30.0000 mL | Freq: Once | INTRAMUSCULAR | Status: AC
  Administered 2018-03-12: 30 mL
  Filled 2018-03-12: qty 30

## 2018-03-12 NOTE — Discharge Instructions (Signed)
Medications: Clindamycin  Treatment: Take clindamycin until completed.  Recommend taking this with a probiotic to help prevent stomach upset.  You can wash with warm soapy water and apply clean dressing 1-2 times daily.   Follow-up: Please return to ED or see your doctor in 2 days for wound recheck. Please return sooner if you develop any new or worsening symptoms including increasing pain, redness, swelling, red streaking from the wound, or fever.

## 2018-03-12 NOTE — ED Notes (Signed)
Pt given diet soda upon request and approval from [place], [place].

## 2018-03-12 NOTE — ED Notes (Signed)
MD Dr. Kathrynn Humble made aware of pt's elevated Lactic.

## 2018-03-12 NOTE — ED Provider Notes (Signed)
[facility] Provider Note   CSN: [id number] Arrival date & time: 03/11/18  2348     History   Chief Complaint Chief Complaint  Patient presents with  . Abscess  . Hyperglycemia  . Hypertension    HPI [name] Tanimoto Sr. is a 42 y.o. male with history of CAD, CHF, diabetes, ESRD with dialysis MWF, hypertension who presents with abscess to left axilla and left groin.  He believes the abscesses within been present for about 5 days.  He has had abscesses in the past.  Patient also reports chest pain that began about 2 hours ago.  He describes it as indigestion.  Patient had been asleep and woke up because of the pain from his abscess and noticed chest pain at this time.  He denies shortness of breath.  He has not noticed any fevers.  He is currently living at the facility for rehab after his left lower extremity amputation in August 2018 and his CABG on 11/04/2017.  He denies any abdominal pain, nausea, vomiting.  He has not missed dialysis recently.  He reports his blood pressure has been elevated for the past 3 to 4 weeks.  He reports his doctors are changing around his blood pressure medications.  He has been having low blood pressure during dialysis.  HPI  Past Medical History:  Diagnosis Date  . Anemia   . Atherosclerosis of lower extremity ([place])   . CAD (coronary artery disease)   . Chronic combined systolic and diastolic heart failure ([place])   . Complication of anesthesia   . ESRD (end stage renal disease) on dialysis [facility] [place])    "MWF [employer]" (03/08/2017)  . GERD (gastroesophageal reflux disease)   . Heart murmur   . History of blood transfusion    "related to OR"  . Hypertension   . PONV (postoperative nausea and vomiting)   . Type II diabetes mellitus [facility])     Patient Active Problem List   Diagnosis Date Noted  . 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 ACS (acute coronary syndrome) ([place]) 10/28/2017  . Flash pulmonary edema ([place])  06/04/2017  . Hypertensive emergency 06/04/2017  . 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 S/P unilateral BKA (below knee amputation), left ([place])   . Post-operative pain   . Acute blood loss anemia   . Chronic combined systolic and diastolic CHF (congestive heart failure) ([place])   . PVD (peripheral vascular disease) ([place])   . Coronary artery disease involving native coronary artery of native heart with unstable angina pectoris ([place])   . Tobacco abuse   . Diabetic foot infection ([place]) 03/08/2017  . Osteomyelitis ([place]) 03/08/2017  . Wound dehiscence   . Diabetes mellitus with complication ([place])   . Dehiscence of amputation stump ([place]) 01/04/2017  . S/P transmetatarsal amputation of foot, left ([place]) 11/16/2016  . Gangrene of right foot ([place]) 08/02/2016  . Achilles tendon contracture, left 08/02/2016  . Anemia of renal disease 08/16/2015  . Wound infection 08/15/2015  . Diabetic ulcer of right great toe ([place]) 08/15/2015  . Pain in the chest   . Elevated troponin   . Essential hypertension   . ESRD on dialysis ([place]) 08/07/2013  . Diabetes ([place]) 08/07/2013  . Chest pain 05/16/2012  . Murmur 05/16/2012  . Renal disorder     Past Surgical History:  Procedure  Laterality Date  . ABDOMINAL AORTOGRAM N/A 11/02/2016   Procedure: Abdominal Aortogram;  Surgeon: Waynetta Sandy, MD;  Location: [place] CV LAB;  Service: Cardiovascular;  Laterality: N/A;  . AMPUTATION Left 09/27/2013   Procedure: LEFT GREAT TOE AMPUTATION;  Surgeon: [name], MD;  Location: [place];  Service: Orthopedics;  Laterality: Left;  . AMPUTATION Right 08/15/2015   Procedure: Right Great Toe Amputation;  Surgeon: [name], MD;  Location: [place];  Service: Orthopedics;  Laterality: Right;  . AMPUTATION Left 11/05/2016   Procedure: TRANSMETATARSAL  AMPUTATION LEFT FOOT;  Surgeon: [name], MD;  Location: [place];  Service: Orthopedics;  Laterality: Left;  . AMPUTATION Left 03/11/2017   Procedure: LEFT BELOW KNEE AMPUTATION;  Surgeon: [name], MD;  Location: [place];  Service: Orthopedics;  Laterality: Left;  . AMPUTATION Right 03/11/2017   Procedure: RIGHT 2ND TOE AMPUTATION;  Surgeon: [name], MD;  Location: [place];  Service: Orthopedics;  Laterality: Right;  . AV FISTULA PLACEMENT  left arm  . CORONARY ARTERY BYPASS GRAFT N/A 11/04/2017   Procedure: CORONARY ARTERY BYPASS GRAFTING (CABG) x three, using left internal mammary artery and right    leg greater saphenous vein;  Surgeon: Melrose Nakayama, MD;  Location: [place];  Service: Open Heart Surgery;  Laterality: N/A;  . LEFT HEART CATH AND CORONARY ANGIOGRAPHY N/A 10/31/2017   Procedure: LEFT HEART CATH AND CORONARY ANGIOGRAPHY;  Surgeon: Troy Sine, MD;  Location: [place] CV LAB;  Service: Cardiovascular;  Laterality: N/A;  . LEFT HEART CATHETERIZATION WITH CORONARY ANGIOGRAM N/A 09/13/2014   Procedure: LEFT HEART CATHETERIZATION WITH CORONARY ANGIOGRAM;  Surgeon: Sinclair Grooms, MD;  Location[facility];  Service: Cardiovascular;  Laterality: N/A;  . LOWER EXTREMITY ANGIOGRAPHY Bilateral 11/02/2016   Procedure: Lower Extremity Angiography;  Surgeon: Waynetta Sandy, MD;  Location: [place] CV LAB;  Service: Cardiovascular;  Laterality: Bilateral;  . PERIPHERAL VASCULAR ATHERECTOMY Left 11/02/2016   Procedure: Peripheral Vascular Atherectomy;  Surgeon: Waynetta Sandy, MD;  Location: [place] CV LAB;  Service: Cardiovascular;  Laterality: Left;  PERONEAL  . STUMP REVISION Left 01/11/2017   Procedure: Revision Left Transmetatarsal Amputation;  Surgeon: [name], MD;  Location: [place];  Service: Orthopedics;  Laterality: Left;  . TEE WITHOUT CARDIOVERSION N/A 11/04/2017   Procedure: TRANSESOPHAGEAL ECHOCARDIOGRAM (TEE);  Surgeon:  Melrose Nakayama, MD;  Location: [place];  Service: Open Heart Surgery;  Laterality: N/A;        Home Medications    Prior to Admission medications   Medication Sig Start Date End Date Taking? Authorizing Provider  aspirin EC 81 MG tablet Take 1 tablet (81 mg total) by mouth daily. 02/07/18  Yes Burtis Junes, NP  atorvastatin (LIPITOR) 80 MG tablet Take 1 tablet (80 mg total) by mouth daily at 6 PM. 02/07/18  Yes Burtis Junes, NP  calcitRIOL (ROCALTROL) 0.25 MCG capsule Take 5 capsules (1.25 mcg total) by mouth every Monday, Wednesday, and Friday with hemodialysis. 11/09/17  Yes Conte, Tessa N, PA-C  calcium acetate (PHOSLO) 667 MG capsule Take 3 capsules (2,001 mg total) by mouth 3 (three) times daily with meals. 03/17/17  Yes Rai, Ripudeep K, MD  carvedilol (COREG) 6.25 MG tablet Take 1 tablet (6.25 mg total) by mouth 2 (two) times daily with a meal. 02/07/18  Yes Burtis Junes, NP  clopidogrel (PLAVIX) 75 MG tablet Take 1 tablet (75 mg total) by  mouth daily. 02/07/18  Yes Burtis Junes, NP  Darbepoetin Alfa (ARANESP) 60 MCG/0.3ML SOSY injection Inject 0.3 mLs (60 mcg total) into the vein every Tuesday with hemodialysis. Patient taking differently: Inject 60 mcg into the vein See admin instructions. On Tuesday and Thursday with Dialysis 11/15/17  Yes Harriet Pho, Tessa N, PA-C  docusate sodium (COLACE) 100 MG capsule Take 100 mg by mouth daily.   Yes [provider]  glucagon (GLUCAGON EMERGENCY) 1 MG injection Inject 1 mg into the vein once as needed (low blood sugar and inability to eat or drink sugar). 08/16/15  Yes Short, Noah Delaine, MD  insulin aspart (NOVOLOG) 100 UNIT/ML injection Sliding scale CBG 70 - 120: 0 units CBG 121 - 150: 1 unit,  CBG 151 - 200: 2 units,  CBG 201 - 250: 3 units,  CBG 251 - 300: 5 units,  CBG 301 - 350: 7 units,  CBG 351 - 400: 9 units   CBG > 400: 9 units and notify your MD Patient taking differently: Inject 0-9 Units into the skin See admin  instructions. Inject 0-9 units into the skin two times a day per sliding scale: BGL 70-120 = 0 units; 121-150 = 1 unit; 151-200 = 2 units; 201-250 = 3 units; 251-300 = 5 units; 301-350 = 7 units; 351-400 = 9 units; >400 = 9 units and call MD 03/17/17  Yes Rai, Ripudeep K, MD  insulin glargine (LANTUS) 100 UNIT/ML injection Inject 0.07 mLs (7 Units total) into the skin at bedtime. 03/17/17  Yes Rai, Ripudeep K, MD  ipratropium-albuterol (DUONEB) 0.5-2.5 (3) MG/3ML SOLN Take 3 mLs by nebulization 3 (three) times daily as needed (for shortness of breath).    Yes [provider]  lamoTRIgine (LAMICTAL) 150 MG tablet Take 150 mg by mouth at bedtime.   Yes [provider]  lanthanum (FOSRENOL) 1000 MG chewable tablet Chew 2 tablets (2,000 mg total) by mouth 3 (three) times daily with meals. 08/16/15  Yes Short, Noah Delaine, MD  methocarbamol (ROBAXIN) 500 MG tablet Take 1 tablet (500 mg total) by mouth every 6 (six) hours as needed for muscle spasms. 03/17/17  Yes Rai, Ripudeep K, MD  Multiple Vitamins-Minerals (CERTA-VITE PO) Take 1 tablet by mouth daily.   Yes [provider]  ondansetron (ZOFRAN) 4 MG tablet Take 4 mg by mouth every 6 (six) hours as needed for nausea.    Yes [provider]  oxyCODONE (OXY IR/ROXICODONE) 5 MG immediate release tablet Take 5 mg by mouth every 6 (six) hours as needed for severe pain.   Yes [provider]  polyethylene glycol (MIRALAX / GLYCOLAX) packet Take 17 g by mouth daily.   Yes [provider]  varenicline (CHANTIX) 0.5 MG tablet Take 0.5 mg by mouth daily.   Yes [provider]  clindamycin (CLEOCIN) 150 MG capsule Take 3 capsules (450 mg total) by mouth 3 (three) times daily for 7 days. 03/12/18 03/19/18  Frederica Kuster, PA-C    Family History Family History  Problem Relation Age of Onset  . Heart failure Mother   . Hypertension Mother     Social History Social History   Tobacco Use  . Smoking status:  Current Every Day Smoker    Packs/day: 0.50    Years: 26.00    Pack years: 13.00    Types: Cigarettes  . Smokeless tobacco: Never Used  Substance Use Topics  . Alcohol use: Yes    Comment: 03/08/2017 "haven't took a drink in over  5 years"  . Drug use: Yes    Types: Marijuana    Comment: 03/08/2017 "basically q day"      Allergies   Coconut oil   Review of Systems Review of Systems  Constitutional: Negative for chills and fever.  HENT: Negative for facial swelling and sore throat.   Respiratory: Negative for shortness of breath.   Cardiovascular: Positive for chest pain.  Gastrointestinal: Negative for abdominal pain, nausea and vomiting.  Genitourinary: Negative for dysuria.  Musculoskeletal: Negative for back pain.  Skin: Positive for wound (abscess). Negative for rash.  Neurological: Negative for headaches.  Psychiatric/Behavioral: The patient is not nervous/anxious.      Physical Exam Updated Vital Signs BP (!) 174/93   Pulse 99   Temp 98.3 F (36.8 C) (Oral)   Resp 10   SpO2 97%   Physical Exam  Constitutional: He appears well-developed and well-nourished. No distress.  HENT:  Head: Normocephalic and atraumatic.  Mouth/Throat: Oropharynx is clear and moist. No oropharyngeal exudate.  Eyes: Pupils are equal, round, and reactive to light. Conjunctivae are normal. Right eye exhibits no discharge. Left eye exhibits no discharge. No scleral icterus.  Neck: Normal range of motion. Neck supple. No thyromegaly present.  Cardiovascular: Normal rate, regular rhythm, normal heart sounds and intact distal pulses. Exam reveals no gallop and no friction rub.  No murmur heard. Pulmonary/Chest: Effort normal and breath sounds normal. No stridor. No respiratory distress. He has no wheezes. He has no rales. He exhibits tenderness.    Abdominal: Soft. Bowel sounds are normal. He exhibits no distension. There is no tenderness. There is no rebound and no guarding.    Musculoskeletal: He exhibits no edema.  Left lower extremity BKA  Lymphadenopathy:    He has no cervical adenopathy.  Neurological: He is alert. Coordination normal.  Skin: Skin is warm and dry. No rash noted. He is not diaphoretic. No pallor.  ~4 x 5 cm abscess to the left axilla with pustule over area of fluctuance, not actively draining 1 x 1 cm to the left inguinal region with 1 cm pustule, actively draining, no fluctuance  Psychiatric: He has a normal mood and affect.  Nursing note and vitals reviewed.    ED Treatments / Results  Labs (all labs ordered are listed, but only abnormal results are displayed) Labs Reviewed  BASIC METABOLIC PANEL - Abnormal; Notable for the following components:      Result Value   Sodium 131 (*)    Chloride 88 (*)    Glucose, Bld 423 (*)    BUN 50 (*)    Creatinine, Ser 10.62 (*)    GFR calc non Af Amer 5 (*)    GFR calc Af Amer 6 (*)    Anion gap 16 (*)    All other components within normal limits  CBC - Abnormal; Notable for the following components:   RDW 19.2 (*)    All other components within normal limits  CBG MONITORING, ED - Abnormal; Notable for the following components:   Glucose-Capillary 410 (*)    All other components within normal limits  I-STAT CG4 LACTIC ACID, ED - Abnormal; Notable for the following components:   Lactic Acid, Venous 2.57 (*)    All other components within normal limits  I-STAT CG4 LACTIC ACID, ED - Abnormal; Notable for the following components:   Lactic Acid, Venous 2.06 (*)    All other components within normal limits  CBG MONITORING, ED - Abnormal; Notable for the  following components:   Glucose-Capillary 292 (*)    All other components within normal limits  URINALYSIS, ROUTINE W REFLEX MICROSCOPIC  I-STAT TROPONIN, ED  I-STAT TROPONIN, ED  I-STAT CG4 LACTIC ACID, ED    EKG EKG Interpretation  Date/Time:  Sunday March 12 2018 01:28:45 EDT Ventricular Rate:  90 PR Interval:    QRS  Duration: 106 QT Interval:  391 QTC Calculation: 479 R Axis:   60 Text Interpretation:  Sinus rhythm Probable left atrial enlargement Nonspecific T abnormalities, lateral leads Borderline prolonged QT interval twi in the inferior and lateral leads have resolved Confirmed by Varney Biles [phone]) on 03/12/2018 2:53:14 AM   Radiology Dg Chest 2 View  Result Date: 03/12/2018 CLINICAL DATA:  Chest pain.  Patient reports left axillary abscess. EXAM: CHEST - 2 VIEW COMPARISON:  Chest radiographs 12/20/2017 FINDINGS: Post median sternotomy and CABG.The cardiomediastinal contours are normal. The lungs are clear. Pulmonary vasculature is normal. No consolidation, pleural effusion, or pneumothorax. Soft tissue fullness in the left axilla. No evidence of soft tissue air. No acute osseous abnormalities are seen. IMPRESSION: 1. Post CABG without congestive failure or acute pulmonary process. 2. Soft tissue fullness in the left axilla, patient reports left axillary abscess. Electronically Signed   By: Jeb Levering M.D.   On: 03/12/2018 01:48    Procedures .Marland KitchenIncision and Drainage Date/Time: 03/12/2018 7:24 AM Performed by: Frederica Kuster, PA-C Authorized by: Frederica Kuster, PA-C   Consent:    Consent obtained:  Verbal   Consent given by:  Patient   Risks discussed:  Incomplete drainage, bleeding and pain   Alternatives discussed:  No treatment Location:    Type:  Abscess   Size:  5 cm   Location:  Upper extremity   Upper extremity location: L axilla. Pre-procedure details:    Skin preparation:  Chloraprep Anesthesia (see MAR for exact dosages):    Anesthesia method:  Local infiltration   Local anesthetic:  Lidocaine 1% w/o epi Procedure type:    Complexity:  Complex Procedure details:    Needle aspiration: no     Incision types:  Single straight   Incision depth:  Dermal   Scalpel blade:  11   Wound management:  Probed and deloculated   Drainage:  Purulent and bloody   Drainage  amount:  Moderate   Wound treatment:  Wound left open   Packing materials:  None Post-procedure details:    Patient tolerance of procedure:  Tolerated well, no immediate complications   (including critical care time)  Medications Ordered in ED Medications  lidocaine (PF) (XYLOCAINE) 1 % injection 30 mL (30 mLs Infiltration Given 03/12/18 0408)  sodium chloride 0.9 % bolus 500 mL (0 mLs Intravenous Stopped 03/12/18 0304)  morphine 4 MG/ML injection 4 mg (4 mg Intravenous Given 03/12/18 0405)  clindamycin (CLEOCIN) capsule 450 mg (450 mg Oral Given 03/12/18 0642)  carvedilol (COREG) tablet 6.25 mg (6.25 mg Oral Given 03/12/18 7672)     Initial Impression / Assessment and Plan / ED Course  I have reviewed the triage vital signs and the nursing notes.  Pertinent labs & imaging results that were available during my care of the patient were reviewed by me and considered in my medical decision making (see chart for details).     Patient presenting with a left axillary abscess.  Incision and drainage was performed in the ED of the left axilla.  Inguinal abscess not amenable to incision and drainage today.  Labs are within normal  limits for the patient.  Glucose decreased with 500 mL fluid bolus in the ED.  Delta troponin is negative.  Patient is chest pain-free without intervention.  CABG x3 in March 2019.  Will initiate clindamycin for abscess.  Patient was to return or be evaluated by his doctor in 2 days for recheck.  Return precautions discussed.  Morning blood pressure medication given prior to discharge.  Patient understands and agrees with plan.  Patient discharged in satisfactory condition.  Patient also evaluated by Dr. Kathrynn Humble who had the patient's management and agrees with plan.  Final Clinical Impressions(s) / ED Diagnoses   Final diagnoses:  Abscess  Hyperglycemia    ED Discharge Orders        Ordered    clindamycin (CLEOCIN) 150 MG capsule  3 times daily     03/12/18 0612        Frederica Kuster, PA-C 03/12/18 7289    Varney Biles, MD 03/12/18 [phone]

## 2018-03-12 NOTE — ED Notes (Signed)
Notified Dr. Kathrynn Humble of pt's elevated Lactic and pt's trop.

## 2018-04-04 ENCOUNTER — Ambulatory Visit (HOSPITAL_COMMUNITY)
Admission: RE | Admit: 2018-04-04 | Discharge: 2018-04-04 | Disposition: A | Discharge status: Home / Self Care | Payer: Medicaid Other | Source: Ambulatory Visit | Attending: Nephrology | Admitting: Nephrology

## 2018-04-04 DIAGNOSIS — D649 Anemia, unspecified: Secondary | ICD-10-CM | POA: Insufficient documentation

## 2018-04-04 LAB — PREPARE RBC (CROSSMATCH)

## 2018-04-04 LAB — ABO/RH: ABO/RH(D): B POS

## 2018-04-04 MED ORDER — SODIUM CHLORIDE 0.9% IV SOLUTION: Freq: Once | INTRAVENOUS | Status: DC

## 2018-04-04 NOTE — Discharge Instructions (Signed)

## 2018-04-04 NOTE — Progress Notes (Signed)
PATIENT CARE CENTER NOTE  Diagnosis: Anemia    Provider: Dr. Jimmy Footman   Procedure: 1 unit PRBC   Note: Patient received 1 unit of blood. Tolerated transfusion well with no adverse reaction. Vital signs remained stable. Discharge instructions given. Patient alert, oriented and ambulatory at discharge.

## 2018-04-05 LAB — TYPE AND SCREEN
ABO/RH(D): B POS
ANTIBODY SCREEN: NEGATIVE
Unit division: 0

## 2018-04-05 LAB — BPAM RBC
BLOOD PRODUCT EXPIRATION DATE: 201909202359
ISSUE DATE / TIME: 201908201023
UNIT TYPE AND RH: 7300

## 2018-05-19 NOTE — Progress Notes (Signed)
Cardiology Office Note   Date:  05/30/2018   ID:  [name] Labella Sr., DOB [birth year], MRN [id number]  PCP:  Patient, [name] Per  Cardiologist:   Jenkins Rouge, MD   No chief complaint on file.     History of Present Illness: [name] Goldston Sr. is a 42 y.o. male who presents for f/u of CAD. I have not seen him in 2 years. Most recently seen by Dr Kathrynn Speed and PA;s. ESRD on dialysis HTN, CHF , DM, vascular disease with left BKA.  Had CABG with Dr Roxan Hockey 11/04/17 with LIMA to LAD  SVG OM1 and SVG PDA EF by TTE 10/30/17 50-55% Rx with antibiotics for carbuncle on left chest near sternotomy 12/20/17  Diabetes is poorly controlled with A1c in 12 range Tends to be anemic followed by renal and transfused a unit on 04/04/18   Past Medical History:  Diagnosis Date  . Anemia   . Atherosclerosis of lower extremity ([place])   . CAD (coronary artery disease)   . Chronic combined systolic and diastolic heart failure ([place])   . Complication of anesthesia   . ESRD (end stage renal disease) on dialysis [facility])    "MWF [employer]" (03/08/2017)  . GERD (gastroesophageal reflux disease)   . Heart murmur   . History of blood transfusion    "related to OR"  . Hypertension   . PONV (postoperative nausea and vomiting)   . Type II diabetes mellitus ([place])     Past Surgical History:  Procedure Laterality Date  . ABDOMINAL AORTOGRAM N/A 11/02/2016   Procedure: Abdominal Aortogram;  Surgeon: Waynetta Sandy, MD;  Location: [place] [facility];  Service: Cardiovascular;  Laterality: N/A;  . AMPUTATION Left 09/27/2013   Procedure: LEFT GREAT TOE AMPUTATION;  Surgeon: [name], MD;  Location: [place];  Service: Orthopedics;  Laterality: Left;  . AMPUTATION Right 08/15/2015   Procedure: Right Great Toe Amputation;  Surgeon: [name], MD;  Location: [place];  Service: Orthopedics;  Laterality: Right;  . AMPUTATION Left 11/05/2016   Procedure: TRANSMETATARSAL AMPUTATION LEFT  FOOT;  Surgeon: [name], MD;  Location: [place];  Service: Orthopedics;  Laterality: Left;  . AMPUTATION Left 03/11/2017   Procedure: LEFT BELOW KNEE AMPUTATION;  Surgeon: [name], MD;  Location: [place];  Service: Orthopedics;  Laterality: Left;  . AMPUTATION Right 03/11/2017   Procedure: RIGHT 2ND TOE AMPUTATION;  Surgeon: [name], MD;  Location: [place];  Service: Orthopedics;  Laterality: Right;  . AV FISTULA PLACEMENT  left arm  . CORONARY ARTERY BYPASS GRAFT N/A 11/04/2017   Procedure: CORONARY ARTERY BYPASS GRAFTING (CABG) x three, using left internal mammary artery and right    leg greater saphenous vein;  Surgeon: Melrose Nakayama, MD;  Location: [place];  Service: Open Heart Surgery;  Laterality: N/A;  . LEFT HEART CATH AND CORONARY ANGIOGRAPHY N/A 10/31/2017   Procedure: LEFT HEART CATH AND CORONARY ANGIOGRAPHY;  Surgeon: Troy Sine, MD;  Location: [place] [facility];  Service: Cardiovascular;  Laterality: N/A;  . LEFT HEART CATHETERIZATION WITH CORONARY ANGIOGRAM N/A 09/13/2014   Procedure: LEFT HEART CATHETERIZATION WITH CORONARY ANGIOGRAM;  Surgeon: Sinclair Grooms, MD;  Location: [facility] CATH [facility];  Service: Cardiovascular;  Laterality: N/A;  . LOWER EXTREMITY ANGIOGRAPHY Bilateral 11/02/2016   Procedure: Lower Extremity Angiography;  Surgeon: Waynetta Sandy, MD;  Location: [place] [facility];  Service: Cardiovascular;  Laterality: Bilateral;  .  PERIPHERAL VASCULAR ATHERECTOMY Left 11/02/2016   Procedure: Peripheral Vascular Atherectomy;  Surgeon: Waynetta Sandy, MD;  Location: [place] [facility];  Service: Cardiovascular;  Laterality: Left;  PERONEAL  . STUMP REVISION Left 01/11/2017   Procedure: Revision Left Transmetatarsal Amputation;  Surgeon: [name], MD;  Location: [place];  Service: Orthopedics;  Laterality: Left;  . TEE WITHOUT CARDIOVERSION N/A 11/04/2017   Procedure: TRANSESOPHAGEAL ECHOCARDIOGRAM (TEE);  Surgeon: Melrose Nakayama,  MD;  Location: [place];  Service: Open Heart Surgery;  Laterality: N/A;     Current Outpatient Medications  Medication Sig Dispense Refill  . aspirin EC 81 MG tablet Take 1 tablet (81 mg total) by mouth daily. 30 tablet 11  . atorvastatin (LIPITOR) 80 MG tablet Take 1 tablet (80 mg total) by mouth daily at 6 PM. 30 tablet 11  . calcitRIOL (ROCALTROL) 0.25 MCG capsule Take 5 capsules (1.25 mcg total) by mouth every Monday, Wednesday, and Friday with hemodialysis. 30 capsule 1  . carvedilol (COREG) 6.25 MG tablet Take 1 tablet (6.25 mg total) by mouth 2 (two) times daily with a meal. 60 tablet 11  . clopidogrel (PLAVIX) 75 MG tablet Take 1 tablet (75 mg total) by mouth daily. 30 tablet 6  . Darbepoetin Alfa (ARANESP) 60 MCG/0.3ML SOSY injection Inject 0.3 mLs (60 mcg total) into the vein every Tuesday with hemodialysis. (Patient taking differently: Inject 60 mcg into the vein See admin instructions. On Tuesday and Thursday with Dialysis) 4.2 mL 1  . docusate sodium (COLACE) 100 MG capsule Take 100 mg by mouth daily.    Marland Kitchen glucagon (GLUCAGON EMERGENCY) 1 MG injection Inject 1 mg into the vein once as needed (low blood sugar and inability to eat or drink sugar). 1 each 0  . insulin aspart (NOVOLOG) 100 UNIT/ML injection Sliding scale CBG 70 - 120: 0 units CBG 121 - 150: 1 unit,  CBG 151 - 200: 2 units,  CBG 201 - 250: 3 units,  CBG 251 - 300: 5 units,  CBG 301 - 350: 7 units,  CBG 351 - 400: 9 units   CBG > 400: 9 units and notify your MD (Patient taking differently: Inject 0-9 Units into the skin See admin instructions. Inject 0-9 units into the skin two times a day per sliding scale: BGL 70-120 = 0 units; 121-150 = 1 unit; 151-200 = 2 units; 201-250 = 3 units; 251-300 = 5 units; 301-350 = 7 units; 351-400 = 9 units; >400 = 9 units and call MD) 10 mL 11  . insulin glargine (LANTUS) 100 UNIT/ML injection Inject 0.07 mLs (7 Units total) into the skin at bedtime. 10 mL 12  . ipratropium-albuterol (DUONEB)  0.5-2.5 (3) MG/3ML SOLN Take 3 mLs by nebulization 3 (three) times daily as needed (for shortness of breath).     . lamoTRIgine (LAMICTAL) 150 MG tablet Take 150 mg by mouth at bedtime.    Marland Kitchen lanthanum (FOSRENOL) 1000 MG chewable tablet Chew 2 tablets (2,000 mg total) by mouth 3 (three) times daily with meals. 180 tablet 0  . methocarbamol (ROBAXIN) 500 MG tablet Take 1 tablet (500 mg total) by mouth every 6 (six) hours as needed for muscle spasms.    . Multiple Vitamins-Minerals (CERTA-VITE PO) Take 1 tablet by mouth daily.    . ondansetron (ZOFRAN) 4 MG tablet Take 4 mg by mouth every 6 (six) hours as needed for nausea.     . polyethylene glycol (MIRALAX / GLYCOLAX) packet Take 17 g by  mouth daily.    . calcium acetate (PHOSLO) 667 MG capsule Take 3 capsules (2,001 mg total) by mouth 3 (three) times daily with meals.    Marland Kitchen oxyCODONE (OXY IR/ROXICODONE) 5 MG immediate release tablet Take 5 mg by mouth every 6 (six) hours as needed for severe pain.    . varenicline (CHANTIX) 0.5 MG tablet Take 0.5 mg by mouth daily.     No current facility-administered medications for this visit.     Allergies:   Coconut oil    Social History:  The patient  reports that he has been smoking cigarettes. He has a 13.00 pack-year smoking history. He has never used smokeless tobacco. He reports that he drinks alcohol. He reports that he has current or past drug history. Drug: Marijuana.   Family History:  The patient's family history includes Heart failure in his mother; Hypertension in his mother.    ROS:  Please see the history of present illness.   Otherwise, review of systems are positive for none.   All other systems are reviewed and negative.    PHYSICAL EXAM: VS:  BP 116/80   Wt 205 lb 11.2 oz (93.3 kg)   BMI 26.41 kg/m  , BMI Body mass index is 26.41 kg/m. Affect appropriate Chronically ill black male  HEENT: normal Neck supple with no adenopathy JVP normal no bruits no thyromegaly Lungs clear  with no wheezing and good diaphragmatic motion Heart:  S1/S2 no murmur, no rub, gallop or click PMI normal Abdomen: benighn, BS positve, no tenderness, no AAA no bruit.  No HSM or HJR Distal pulses intact with no bruits No edema Neuro non-focal Skin warm and dry No muscular weakness Fistula LUE with thrill     EKG:  73110 SR rate 90 LAE nonspecific ST changes    Recent Labs: 06/04/2017: B Natriuretic Peptide >4,500.0 10/29/2017: TSH 1.011 11/05/2017: Magnesium 2.0 02/07/2018: ALT 29 03/12/2018: BUN 50; Creatinine, Ser 10.62; Hemoglobin 14.5; Platelets 307; Potassium 4.6; Sodium 131    Lipid Panel    Component Value Date/Time   CHOL 171 02/07/2018 0937   TRIG 249 (H) 02/07/2018 0937   HDL 38 (L) 02/07/2018 0937   CHOLHDL 4.5 02/07/2018 0937   CHOLHDL 5.3 10/29/2017 0240   VLDL 53 (H) 10/29/2017 0240   LDLCALC 83 02/07/2018 0937      Wt Readings from Last 3 Encounters:  05/30/18 205 lb 11.2 oz (93.3 kg)  02/07/18 170 lb 6.4 oz (77.3 kg)  01/31/18 167 lb 8.8 oz (76 kg)      Other studies Reviewed: Additional studies/ records that were reviewed today include: Notes from [place] Dr Roxan Hockey CABG report labs and TTE .    ASSESSMENT AND PLAN:  1.  CAD/CABG March 2019 with LIMA to LAD SVG OM and SVG PDA Sternum healed carbuncle resolved 2. CRF: f/u dialysis [place] anemia with transfusion August on dialysis 12 years now 3. PVD: post LLE BKA f/u [facility] Dr Donzetta Matters    4. DM:  Discussed low carb diet.  Target hemoglobin A1c is 6.5 or less.  Continue current medications. 5. Smoking:  Discussed cessation less than 10 minutes    Current medicines are reviewed at length with the patient today.  The patient does not have concerns regarding medicines.  The following changes have been made:  no change  Labs/ tests ordered today include: none  No orders of the defined types were placed in this encounter.    Disposition:   FU with cardiology in a  year      Signed, Jenkins Rouge, MD  05/30/2018 11:10 AM    [address], [place], [place]  [address] Phone: [phone]; Fax: [phone]

## 2018-05-30 ENCOUNTER — Encounter: Payer: Self-pay | Admitting: Cardiovascular Disease

## 2018-05-30 ENCOUNTER — Ambulatory Visit (INDEPENDENT_AMBULATORY_CARE_PROVIDER_SITE_OTHER): Payer: Medicaid Other | Admitting: Cardiovascular Disease

## 2018-05-30 VITALS — BP 116/80 | Wt 205.7 lb

## 2018-05-30 DIAGNOSIS — Z951 Presence of aortocoronary bypass graft: Secondary | ICD-10-CM | POA: Diagnosis not present

## 2018-05-30 NOTE — Patient Instructions (Signed)

## 2018-07-12 ENCOUNTER — Encounter (HOSPITAL_COMMUNITY): Payer: Self-pay | Admitting: Emergency Medicine

## 2018-07-12 ENCOUNTER — Emergency Department (HOSPITAL_COMMUNITY)
Admission: EM | Admit: 2018-07-12 | Discharge: 2018-07-12 | Disposition: A | Discharge status: Home / Self Care | Payer: Medicaid Other | Attending: Emergency Medicine | Admitting: Emergency Medicine

## 2018-07-12 ENCOUNTER — Emergency Department (HOSPITAL_COMMUNITY): Payer: Medicaid Other

## 2018-07-12 DIAGNOSIS — Z79899 Other long term (current) drug therapy: Secondary | ICD-10-CM | POA: Insufficient documentation

## 2018-07-12 DIAGNOSIS — S91301A Unspecified open wound, right foot, initial encounter: Secondary | ICD-10-CM

## 2018-07-12 DIAGNOSIS — E119 Type 2 diabetes mellitus without complications: Secondary | ICD-10-CM | POA: Diagnosis not present

## 2018-07-12 DIAGNOSIS — R197 Diarrhea, unspecified: Secondary | ICD-10-CM | POA: Insufficient documentation

## 2018-07-12 DIAGNOSIS — F17210 Nicotine dependence, cigarettes, uncomplicated: Secondary | ICD-10-CM | POA: Insufficient documentation

## 2018-07-12 DIAGNOSIS — I132 Hypertensive heart and chronic kidney disease with heart failure and with stage 5 chronic kidney disease, or end stage renal disease: Secondary | ICD-10-CM | POA: Insufficient documentation

## 2018-07-12 DIAGNOSIS — Z992 Dependence on renal dialysis: Secondary | ICD-10-CM | POA: Insufficient documentation

## 2018-07-12 DIAGNOSIS — Z7982 Long term (current) use of aspirin: Secondary | ICD-10-CM | POA: Diagnosis not present

## 2018-07-12 DIAGNOSIS — N186 End stage renal disease: Secondary | ICD-10-CM | POA: Diagnosis not present

## 2018-07-12 DIAGNOSIS — I5042 Chronic combined systolic (congestive) and diastolic (congestive) heart failure: Secondary | ICD-10-CM | POA: Insufficient documentation

## 2018-07-12 DIAGNOSIS — Z794 Long term (current) use of insulin: Secondary | ICD-10-CM | POA: Insufficient documentation

## 2018-07-12 DIAGNOSIS — R110 Nausea: Secondary | ICD-10-CM | POA: Diagnosis present

## 2018-07-12 LAB — CBC WITH DIFFERENTIAL/PLATELET
Abs Immature Granulocytes: 0.46 10*3/uL — ABNORMAL HIGH (ref 0.00–0.07)
BASOS ABS: 0.1 10*3/uL (ref 0.0–0.1)
Basophils Relative: 0 %
EOS ABS: 0.1 10*3/uL (ref 0.0–0.5)
Eosinophils Relative: 1 %
HEMATOCRIT: 42.2 % (ref 39.0–52.0)
Hemoglobin: 14 g/dL (ref 13.0–17.0)
IMMATURE GRANULOCYTES: 3 %
Lymphocytes Relative: 10 %
Lymphs Abs: 1.5 10*3/uL (ref 0.7–4.0)
MCH: 27.7 pg (ref 26.0–34.0)
MCHC: 33.2 g/dL (ref 30.0–36.0)
MCV: 83.6 fL (ref 80.0–100.0)
Monocytes Absolute: 0.9 10*3/uL (ref 0.1–1.0)
Monocytes Relative: 6 %
NEUTROS PCT: 80 %
NRBC: 0 % (ref 0.0–0.2)
Neutro Abs: 11.3 10*3/uL — ABNORMAL HIGH (ref 1.7–7.7)
PLATELETS: 421 10*3/uL — AB (ref 150–400)
RBC: 5.05 MIL/uL (ref 4.22–5.81)
RDW: 13.8 % (ref 11.5–15.5)
WBC: 14.3 10*3/uL — AB (ref 4.0–10.5)

## 2018-07-12 LAB — COMPREHENSIVE METABOLIC PANEL
ALT: 16 U/L (ref 0–44)
ANION GAP: 26 — AB (ref 5–15)
AST: 15 U/L (ref 15–41)
Albumin: 2.7 g/dL — ABNORMAL LOW (ref 3.5–5.0)
Alkaline Phosphatase: 100 U/L (ref 38–126)
BUN: 110 mg/dL — ABNORMAL HIGH (ref 6–20)
CHLORIDE: 88 mmol/L — AB (ref 98–111)
CO2: 14 mmol/L — ABNORMAL LOW (ref 22–32)
CREATININE: 21.67 mg/dL — AB (ref 0.61–1.24)
Calcium: 8.2 mg/dL — ABNORMAL LOW (ref 8.9–10.3)
GFR, EST AFRICAN AMERICAN: 3 mL/min — AB (ref >60)
GFR, EST NON AFRICAN AMERICAN: 2 mL/min — AB (ref >60)
Glucose, Bld: 112 mg/dL — ABNORMAL HIGH (ref 70–99)
POTASSIUM: 6 mmol/L — AB (ref 3.5–5.1)
Sodium: 128 mmol/L — ABNORMAL LOW (ref 135–145)
Total Bilirubin: 1.2 mg/dL (ref 0.3–1.2)
Total Protein: 7.5 g/dL (ref 6.5–8.1)

## 2018-07-12 LAB — I-STAT TROPONIN, ED: Troponin i, poc: 0.04 ng/mL (ref 0.00–0.08)

## 2018-07-12 LAB — MAGNESIUM: MAGNESIUM: 2.5 mg/dL — AB (ref 1.7–2.4)

## 2018-07-12 MED ORDER — OXYCODONE-ACETAMINOPHEN 5-325 MG PO TABS
1.0000 | ORAL_TABLET | Freq: Once | ORAL | Status: AC
  Administered 2018-07-12: 1 via ORAL
  Filled 2018-07-12: qty 1

## 2018-07-12 MED ORDER — ONDANSETRON HCL 4 MG/2ML IJ SOLN
4.0000 mg | Freq: Once | INTRAMUSCULAR | Status: AC
  Administered 2018-07-12: 4 mg via INTRAVENOUS
  Filled 2018-07-12: qty 2

## 2018-07-12 MED ORDER — MORPHINE SULFATE (PF) 4 MG/ML IV SOLN: 4.0000 mg | Freq: Once | INTRAVENOUS | Status: DC

## 2018-07-12 NOTE — ED Triage Notes (Addendum)
Patient arrives via [facility] from home for c/o "feeling bad" pt c/o nausea without vomiting, headache and ulcer to R foot. Patient reports he was released from the nursing home Friday after being there post L lower leg amputation. States he missed dialysis due to being at the nursing home. Last dialysis session was 1 week ago. bp 192/86, cbg 91, hr 80, sat 100% RA. Pt a/ox4 resp e/u, nad.

## 2018-07-12 NOTE — Progress Notes (Addendum)
11:05am-CSW spoke with PA and was informed that pt is in need of a ride to dialysis today. CSW has provided RN with taxi to dialysis center at this time.  CSW consulted by PA as pt is in need of transportation to dialysis on Friday. CSW spoke with pt at bedside and was informed that he is set up with [facility]. CSW spoke with [place] from [facility] and was informed that they do have pt scheduled to be picked up between 5:45am-6:15am.   CSW has updated PA at this time and there are no further CSW needs at this time. CSW will sign off.     Virgie Dad. Majesti Gambrell, MSW, [facility] Clinical Social Worker [phone]

## 2018-07-12 NOTE — ED Notes (Signed)
Per lab, BMP and mag tube hemolyzed. New order placed for recollect.

## 2018-07-12 NOTE — ED Provider Notes (Signed)
[facility] EMERGENCY DEPARTMENT Provider Note   CSN: [id number] Arrival date & time: 07/12/18  4825     History   Chief Complaint Chief Complaint  Patient presents with  . Nausea    HPI [name] Foot Sr. is a 42 y.o. male with history of CAD after CABG, ESRD on dialysis MWF, hypertension, heart failure EF 50-55%, diabetes on insulin, vascular disease with left BKA 2018 is here for evaluation of generalized malaise.  He attributes this to missing dialysis for the last week.  States he was discharged from rehab facility 1 week ago, he was supposed to be picked up on Friday to get dialysis but his ride did not show up.  He called to inquire about this and was told that they would be able to pick him up in 2 days.  Last time he had dialysis was 7 days ago.  He reports associated orthopnea.  Has had similar malaise in the past when he has missed dialysis.  No interventions for this.  No alleviating factors.  Onset has been gradual, moderate.  He does not produce urine. He denies fevers, vomiting, abdominal pain, peripheral edema.  Notes he has a wound to right toes that he manages at home with gauze, no swelling, redness, pus to this area. Dialysis through [facility] on [address]. Additionally notes he has had nausea, diarrhea, headaches x 4-5 days.    HPI  Past Medical History:  Diagnosis Date  . Anemia   . Atherosclerosis of lower extremity ([place])   . CAD (coronary artery disease)   . Chronic combined systolic and diastolic heart failure ([place])   . Complication of anesthesia   . ESRD (end stage renal disease) on dialysis [facility])    "MWF [facility]" (03/08/2017)  . GERD (gastroesophageal reflux disease)   . Heart murmur   . History of blood transfusion    "related to OR"  . Hypertension   . PONV (postoperative nausea and vomiting)   . Type II diabetes mellitus [facility])     Patient Active Problem List   Diagnosis Date Noted  . 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  ACS (acute coronary syndrome) ([place]) 10/28/2017  . Flash pulmonary edema ([place]) 06/04/2017  . Hypertensive emergency 06/04/2017  . 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 S/P unilateral BKA (below knee amputation), left ([place])   . Post-operative pain   . Acute blood loss anemia   . Chronic combined systolic and diastolic CHF (congestive heart failure) ([place])   . PVD (peripheral vascular disease) ([place])   . Coronary artery disease involving native coronary artery of native heart with unstable angina pectoris ([place])   . Tobacco abuse   . Diabetic foot infection ([place]) 03/08/2017  . Osteomyelitis ([place]) 03/08/2017  . Wound dehiscence   . Diabetes mellitus with complication ([place])   . Dehiscence of amputation stump ([place]) 01/04/2017  . S/P transmetatarsal amputation of foot, left ([place]) 11/16/2016  . Gangrene of right foot ([place]) 08/02/2016  . Achilles tendon contracture, left 08/02/2016  . Anemia of renal disease 08/16/2015  . Wound infection 08/15/2015  . Diabetic ulcer of right great toe ([place]) 08/15/2015  . Pain in the chest   . Elevated troponin   . Essential hypertension   . ESRD on dialysis ([place]) 08/07/2013  . Diabetes ([place]) 08/07/2013  . Chest pain 05/16/2012  .  Murmur 05/16/2012  . Renal disorder     Past Surgical History:  Procedure Laterality Date  . ABDOMINAL AORTOGRAM N/A 11/02/2016   Procedure: Abdominal Aortogram;  Surgeon: Waynetta Sandy, MD;  Location: [place] CV LAB;  Service: Cardiovascular;  Laterality: N/A;  . AMPUTATION Left 09/27/2013   Procedure: LEFT GREAT TOE AMPUTATION;  Surgeon: [name], MD;  Location: [place];  Service: Orthopedics;  Laterality: Left;  . AMPUTATION Right 08/15/2015   Procedure: Right Great Toe Amputation;  Surgeon: [name], MD;  Location: [place];  Service: Orthopedics;   Laterality: Right;  . AMPUTATION Left 11/05/2016   Procedure: TRANSMETATARSAL AMPUTATION LEFT FOOT;  Surgeon: [name], MD;  Location: [place];  Service: Orthopedics;  Laterality: Left;  . AMPUTATION Left 03/11/2017   Procedure: LEFT BELOW KNEE AMPUTATION;  Surgeon: [name], MD;  Location: [place];  Service: Orthopedics;  Laterality: Left;  . AMPUTATION Right 03/11/2017   Procedure: RIGHT 2ND TOE AMPUTATION;  Surgeon: [name], MD;  Location: [place];  Service: Orthopedics;  Laterality: Right;  . AV FISTULA PLACEMENT  left arm  . CORONARY ARTERY BYPASS GRAFT N/A 11/04/2017   Procedure: CORONARY ARTERY BYPASS GRAFTING (CABG) x three, using left internal mammary artery and right    leg greater saphenous vein;  Surgeon: Melrose Nakayama, MD;  Location: [place];  Service: Open Heart Surgery;  Laterality: N/A;  . LEFT HEART CATH AND CORONARY ANGIOGRAPHY N/A 10/31/2017   Procedure: LEFT HEART CATH AND CORONARY ANGIOGRAPHY;  Surgeon: Troy Sine, MD;  Location: [place] CV LAB;  Service: Cardiovascular;  Laterality: N/A;  . LEFT HEART CATHETERIZATION WITH CORONARY ANGIOGRAM N/A 09/13/2014   Procedure: LEFT HEART CATHETERIZATION WITH CORONARY ANGIOGRAM;  Surgeon: Sinclair Grooms, MD;  Location: [facility] CATH LAB;  Service: Cardiovascular;  Laterality: N/A;  . LOWER EXTREMITY ANGIOGRAPHY Bilateral 11/02/2016   Procedure: Lower Extremity Angiography;  Surgeon: Waynetta Sandy, MD;  Location: [place] CV LAB;  Service: Cardiovascular;  Laterality: Bilateral;  . PERIPHERAL VASCULAR ATHERECTOMY Left 11/02/2016   Procedure: Peripheral Vascular Atherectomy;  Surgeon: Waynetta Sandy, MD;  Location: [place] CV LAB;  Service: Cardiovascular;  Laterality: Left;  PERONEAL  . STUMP REVISION Left 01/11/2017   Procedure: Revision Left Transmetatarsal Amputation;  Surgeon: [name], MD;  Location: [place];  Service: Orthopedics;  Laterality: Left;  . TEE WITHOUT CARDIOVERSION N/A  11/04/2017   Procedure: TRANSESOPHAGEAL ECHOCARDIOGRAM (TEE);  Surgeon: Melrose Nakayama, MD;  Location: [place];  Service: Open Heart Surgery;  Laterality: N/A;        Home Medications    Prior to Admission medications   Medication Sig Start Date End Date Taking? Authorizing Provider  aspirin EC 81 MG tablet Take 1 tablet (81 mg total) by mouth daily. 02/07/18   Burtis Junes, NP  atorvastatin (LIPITOR) 80 MG tablet Take 1 tablet (80 mg total) by mouth daily at 6 PM. 02/07/18   Burtis Junes, NP  calcitRIOL (ROCALTROL) 0.25 MCG capsule Take 5 capsules (1.25 mcg total) by mouth every Monday, Wednesday, and Friday with hemodialysis. 11/09/17   Elgie Collard, PA-C  calcium acetate (PHOSLO) 667 MG capsule Take 3 capsules (2,001 mg total) by mouth 3 (three) times daily with meals. 03/17/17   Rai, Vernelle Emerald, MD  carvedilol (COREG) 6.25 MG tablet Take 1 tablet (6.25 mg total) by mouth 2 (two) times daily with a meal. 02/07/18   Truitt Merle  C, NP  clopidogrel (PLAVIX) 75 MG tablet Take 1 tablet (75 mg total) by mouth daily. 02/07/18   Burtis Junes, NP  Darbepoetin Alfa (ARANESP) 60 MCG/0.3ML SOSY injection Inject 0.3 mLs (60 mcg total) into the vein every Tuesday with hemodialysis. Patient taking differently: Inject 60 mcg into the vein See admin instructions. On Tuesday and Thursday with Dialysis 11/15/17   Elgie Collard, PA-C  docusate sodium (COLACE) 100 MG capsule Take 100 mg by mouth daily.    [provider]  glucagon (GLUCAGON EMERGENCY) 1 MG injection Inject 1 mg into the vein once as needed (low blood sugar and inability to eat or drink sugar). 08/16/15   [name], MD  insulin aspart (NOVOLOG) 100 UNIT/ML injection Sliding scale CBG 70 - 120: 0 units CBG 121 - 150: 1 unit,  CBG 151 - 200: 2 units,  CBG 201 - 250: 3 units,  CBG 251 - 300: 5 units,  CBG 301 - 350: 7 units,  CBG 351 - 400: 9 units   CBG > 400: 9 units and notify your MD Patient taking differently:  Inject 0-9 Units into the skin See admin instructions. Inject 0-9 units into the skin two times a day per sliding scale: BGL 70-120 = 0 units; 121-150 = 1 unit; 151-200 = 2 units; 201-250 = 3 units; 251-300 = 5 units; 301-350 = 7 units; 351-400 = 9 units; >400 = 9 units and call MD 03/17/17   Rai, Ripudeep K, MD  insulin glargine (LANTUS) 100 UNIT/ML injection Inject 0.07 mLs (7 Units total) into the skin at bedtime. 03/17/17   Rai, Ripudeep K, MD  ipratropium-albuterol (DUONEB) 0.5-2.5 (3) MG/3ML SOLN Take 3 mLs by nebulization 3 (three) times daily as needed (for shortness of breath).     [provider]  lamoTRIgine (LAMICTAL) 150 MG tablet Take 150 mg by mouth at bedtime.    [provider]  lanthanum (FOSRENOL) 1000 MG chewable tablet Chew 2 tablets (2,000 mg total) by mouth 3 (three) times daily with meals. 08/16/15   [name], MD  Multiple Vitamins-Minerals (CERTA-VITE PO) Take 1 tablet by mouth daily.    [provider]  ondansetron (ZOFRAN) 4 MG tablet Take 4 mg by mouth every 6 (six) hours as needed for nausea.     [provider]  polyethylene glycol (MIRALAX / GLYCOLAX) packet Take 17 g by mouth daily.    [provider]  sertraline (ZOLOFT) 100 MG tablet Take 50 mg by mouth daily.    [provider]    Family History Family History  Problem Relation Age of Onset  . Heart failure Mother   . Hypertension Mother     Social History Social History   Tobacco Use  . Smoking status: Current Every Day Smoker    Packs/day: 0.50    Years: 26.00    Pack years: 13.00    Types: Cigarettes  . Smokeless tobacco: Never Used  Substance Use Topics  . Alcohol use: Yes    Comment: 03/08/2017 "haven't took a drink in over 5 years"  . Drug use: Yes    Types: Marijuana    Comment: 03/08/2017 "basically q day"      Allergies   Coconut oil   Review of Systems Review of Systems  Constitutional:       Malaise  Respiratory: Positive  for cough and shortness of breath.   Cardiovascular: Positive for chest pain.  Gastrointestinal: Positive for nausea.  Skin: Positive for  wound (right toe wound).  All other systems reviewed and are negative.    Physical Exam Updated Vital Signs BP (!) 161/76   Pulse 85   Temp 98.3 F (36.8 C) (Oral)   Resp (!) 21   SpO2 95%   Physical Exam  Constitutional: He is oriented to person, place, and time. He appears well-developed and well-nourished. No distress.  NAD.  HENT:  Head: Normocephalic and atraumatic.  Right Ear: External ear normal.  Left Ear: External ear normal.  Nose: Nose normal.  Dry lips and MM   Eyes: Conjunctivae and EOM are normal. No scleral icterus.  Neck: Normal range of motion. Neck supple.  Cardiovascular: Normal rate, regular rhythm and normal heart sounds.  Palpable thrill to LUE fistula.  1+ radial pulses.  1+ DP pulse, right. No peripheral edema.   Pulmonary/Chest: Effort normal and breath sounds normal. He has no wheezes.  No crackles. Normal WOB.   Abdominal: Soft. There is no tenderness.  No obvious abd distention.   Musculoskeletal: Normal range of motion. He exhibits no deformity.  S/p R BKA, surgical incision well healed without erythema, edema, warmth, tenderness or dehiscence.   Neurological: He is alert and oriented to person, place, and time.  Skin: Skin is warm and dry. Capillary refill takes less than 2 seconds.  Moist skin with scant amount of blood in between 4th and 5th toes (right), tender but no erythema edema warmth discharge. No odor.   Psychiatric: He has a normal mood and affect. His behavior is normal. Judgment and thought content normal.  Nursing note and vitals reviewed.    ED Treatments / Results  Labs (all labs ordered are listed, but only abnormal results are displayed) Labs Reviewed  CBC WITH DIFFERENTIAL/PLATELET - Abnormal; Notable for the following components:      Result Value   WBC 14.3 (*)    Platelets 421  (*)    Neutro Abs 11.3 (*)    Abs Immature Granulocytes 0.46 (*)    All other components within normal limits  COMPREHENSIVE METABOLIC PANEL - Abnormal; Notable for the following components:   Sodium 128 (*)    Potassium 6.0 (*)    Chloride 88 (*)    CO2 14 (*)    Glucose, Bld 112 (*)    BUN 110 (*)    Creatinine, Ser 21.67 (*)    Calcium 8.2 (*)    Albumin 2.7 (*)    GFR calc non Af Amer 2 (*)    GFR calc Af Amer 3 (*)    Anion gap 26 (*)    All other components within normal limits  MAGNESIUM - Abnormal; Notable for the following components:   Magnesium 2.5 (*)    All other components within normal limits  I-STAT TROPONIN, ED  CBG MONITORING, ED    EKG EKG Interpretation  Date/Time:  Wednesday July 12 2018 08:29:08 EST Ventricular Rate:  83 PR Interval:    QRS Duration: 118 QT Interval:  456 QTC Calculation: 536 R Axis:   77 Text Interpretation:  Sinus rhythm Ventricular premature complex Aberrant conduction of SV complex(es) Probable left atrial enlargement Nonspecific intraventricular conduction delay Abnormal T, consider ischemia, lateral leads Since last tracing QT has lengthened Abnormal ekg Confirmed by [name] [phone]) on 07/12/2018 8:32:02 AM   Radiology Dg Chest 2 View  Result Date: 07/12/2018 CLINICAL DATA:  Missed dialysis for 1 week. Shortness of breath, nausea, neck pain, and fever. EXAM: CHEST - 2 VIEW COMPARISON:  03/12/2018 FINDINGS: Sequelae of prior CABG are again identified. The cardiomediastinal silhouette is unchanged and within normal limits. There is persistent mild pleural thickening or trace fluid along the fissures without a sizable pleural effusions seen layering dependently. No airspace consolidation, overt pulmonary edema, or pneumothorax is identified. No acute osseous abnormality is seen. IMPRESSION: No active cardiopulmonary disease. Electronically Signed   By: Logan Bores M.D.   On: 07/12/2018 09:54    Procedures Procedures  (including critical care time)  Medications Ordered in ED Medications  oxyCODONE-acetaminophen (PERCOCET/ROXICET) 5-325 MG per tablet 1 tablet (has no administration in time range)  ondansetron (ZOFRAN) injection 4 mg (has no administration in time range)  ondansetron (ZOFRAN) injection 4 mg (4 mg Intravenous Given 07/12/18 1040)     Initial Impression / Assessment and Plan / ED Course  I have reviewed the triage vital signs and the nursing notes.  Pertinent labs & imaging results that were available during my care of the patient were reviewed by me and considered in my medical decision making (see chart for details).  Clinical Course as of Jul 12 1224  Wed Jul 12, 2018  1015 Sequelae of prior CABG are again identified. The cardiomediastinal silhouette is unchanged and within normal limits. There is persistent mild pleural thickening or trace fluid along the fissures without a sizable pleural effusions seen layering dependently. No airspace consolidation, overt pulmonary edema, or pneumothorax is identified. No acute osseous abnormality is seen.  DG Chest 2 View [CG]  1016 Prolonged Qtc 536 from prior   EKG 12-Lead [CG]  1030 SW spoke to pt.  SW called "[employer]" who states they have him scheduled to pick up this Friday 11/29 between 6-7 am.  Pt aware.    [CG]  [phone] Nephrology NP called states they are aware pt is in ER, they are able to see pt at dialysis this afternoon. SW will coordinate with taxi voucher.   [CG]  1206 Sodium(!): 128 [CG]  1207 Potassium(!): 6.0 [CG]  1207 BUN(!): 110 [CG]  1207 Creatinine(!): 21.67 [CG]  1207 GFR, Est African American(!): 3 [CG]  1207 Magnesium(!): 2.5 [CG]    Clinical Course User Index [CG] Kinnie Feil, PA-C   Concern for uremia.  Less likely ischemia given gradual onset of symptoms, although he is a high risk cardiac pt given history.  Exam reassuring without overt signs of fluid overload.  No SIRS criteria met.  CBG 90 by EMS.  Additionally, pt reports nausea, headaches, cough, diarrhea which likely is from viral process. He has no abd pain, fever, vomiting and is tolerating PO.    1220: Na 128, K 6.0, BUN 110, creatinine 21.67, Mg 2.5.  CXR with mild pleural thickening w/o effusions. EKG with prolonged QTc.  All this likely from uremia without acute emergent decompensation.  Nephrology NP called to notify [facility] able to dialyze pt this afternoon.  SW has met with pt. He is to go to dialysis immediately from ER.  Pt agreeable with this.  He is tolerating PO. VSS.  Symptomatic management encouraged for likely viral process.  Final Clinical Impressions(s) / ED Diagnoses   Final diagnoses:  ESRD needing dialysis ([place])  Diarrhea of presumed infectious origin  Open wound of right foot, initial encounter    ED Discharge Orders    None       Arlean Hopping 07/12/18 1225    [name], MD 07/13/18 1145

## 2018-07-12 NOTE — Discharge Instructions (Addendum)
Go to dialysis immediately.  

## 2018-07-12 NOTE — ED Notes (Signed)
Patient transported to X-ray 

## 2018-07-12 NOTE — ED Notes (Signed)
ED Provider at bedside. 

## 2018-07-12 NOTE — ED Provider Notes (Signed)
Medical screening examination/treatment/procedure(s) were conducted as a shared visit with non-physician practitioner(s) and myself.  I personally evaluated the patient during the encounter.  Clinical Impression:   Final diagnoses:  ESRD needing dialysis ([name])  Diarrhea of presumed infectious origin  Open wound of right foot, initial encounter    The patient is a 42 year old male who recently was released from a nursing facility, rehab facility where he has been living for the last year since having a amputation of his lower extremity.  He reports that he has not been to dialysis in 1 week and has had a progressive weakness nausea and overall not feeling well.  He does not feel short of breath and on exam he does not have any significant edema nor does he have any rales or tachypnea.  He appears mildly uncomfortable, I personally placed an IV in the right hand secondary to poor access, blood was drawn.  EKG does show a prolonged QT at 536 which is abnormal for him as his last QT interval was less than 500.  I do not see any signs of hyperkalemia on the EKG.  Labs will be sent, the patient will receive intervention for any identifiable illness.  He is hypertensive which is not surprising since he has not had dialysis in 1 week.   EKG Interpretation  Date/Time:  Wednesday July 12 2018 08:29:08 EST Ventricular Rate:  83 PR Interval:    QRS Duration: 118 QT Interval:  456 QTC Calculation: 536 R Axis:   77 Text Interpretation:  Sinus rhythm Ventricular premature complex Aberrant conduction of SV complex(es) Probable left atrial enlargement Nonspecific intraventricular conduction delay Abnormal T, consider ischemia, lateral leads Since last tracing QT has lengthened Abnormal ekg Confirmed by [name] [phone]) on 07/12/2018 8:32:02 AM         [name], MD 07/13/18 1145

## 2018-07-12 NOTE — ED Notes (Signed)
Discharge instructions discussed with Pt. Pt verbalized understanding. Pt stable and leaving via WC.    

## 2018-08-04 ENCOUNTER — Inpatient Hospital Stay (HOSPITAL_COMMUNITY)
Admission: EM | Admit: 2018-08-04 | Discharge: 2018-08-17 | DRG: 270 | Disposition: A | Discharge status: Home Health | Payer: Medicaid Other | Attending: Internal Medicine | Admitting: Internal Medicine

## 2018-08-04 ENCOUNTER — Emergency Department (HOSPITAL_COMMUNITY): Payer: Medicaid Other

## 2018-08-04 DIAGNOSIS — Z89512 Acquired absence of left leg below knee: Secondary | ICD-10-CM

## 2018-08-04 DIAGNOSIS — E1152 Type 2 diabetes mellitus with diabetic peripheral angiopathy with gangrene: Principal | ICD-10-CM | POA: Diagnosis present

## 2018-08-04 DIAGNOSIS — E1029 Type 1 diabetes mellitus with other diabetic kidney complication: Secondary | ICD-10-CM | POA: Diagnosis present

## 2018-08-04 DIAGNOSIS — N186 End stage renal disease: Secondary | ICD-10-CM

## 2018-08-04 DIAGNOSIS — Z8249 Family history of ischemic heart disease and other diseases of the circulatory system: Secondary | ICD-10-CM

## 2018-08-04 DIAGNOSIS — Z89421 Acquired absence of other right toe(s): Secondary | ICD-10-CM

## 2018-08-04 DIAGNOSIS — I132 Hypertensive heart and chronic kidney disease with heart failure and with stage 5 chronic kidney disease, or end stage renal disease: Secondary | ICD-10-CM | POA: Diagnosis present

## 2018-08-04 DIAGNOSIS — X58XXXA Exposure to other specified factors, initial encounter: Secondary | ICD-10-CM | POA: Diagnosis present

## 2018-08-04 DIAGNOSIS — Z91018 Allergy to other foods: Secondary | ICD-10-CM

## 2018-08-04 DIAGNOSIS — I5042 Chronic combined systolic (congestive) and diastolic (congestive) heart failure: Secondary | ICD-10-CM | POA: Diagnosis present

## 2018-08-04 DIAGNOSIS — I10 Essential (primary) hypertension: Secondary | ICD-10-CM | POA: Diagnosis present

## 2018-08-04 DIAGNOSIS — M79674 Pain in right toe(s): Secondary | ICD-10-CM

## 2018-08-04 DIAGNOSIS — F39 Unspecified mood [affective] disorder: Secondary | ICD-10-CM | POA: Diagnosis present

## 2018-08-04 DIAGNOSIS — E785 Hyperlipidemia, unspecified: Secondary | ICD-10-CM | POA: Diagnosis present

## 2018-08-04 DIAGNOSIS — Z79899 Other long term (current) drug therapy: Secondary | ICD-10-CM

## 2018-08-04 DIAGNOSIS — E871 Hypo-osmolality and hyponatremia: Secondary | ICD-10-CM | POA: Diagnosis not present

## 2018-08-04 DIAGNOSIS — E1122 Type 2 diabetes mellitus with diabetic chronic kidney disease: Secondary | ICD-10-CM | POA: Diagnosis present

## 2018-08-04 DIAGNOSIS — I96 Gangrene, not elsewhere classified: Secondary | ICD-10-CM | POA: Diagnosis present

## 2018-08-04 DIAGNOSIS — D631 Anemia in chronic kidney disease: Secondary | ICD-10-CM | POA: Diagnosis present

## 2018-08-04 DIAGNOSIS — I2510 Atherosclerotic heart disease of native coronary artery without angina pectoris: Secondary | ICD-10-CM | POA: Diagnosis present

## 2018-08-04 DIAGNOSIS — T79A0XA Compartment syndrome, unspecified, initial encounter: Secondary | ICD-10-CM | POA: Diagnosis present

## 2018-08-04 DIAGNOSIS — D62 Acute posthemorrhagic anemia: Secondary | ICD-10-CM | POA: Diagnosis not present

## 2018-08-04 DIAGNOSIS — I5020 Unspecified systolic (congestive) heart failure: Secondary | ICD-10-CM | POA: Diagnosis present

## 2018-08-04 DIAGNOSIS — Z951 Presence of aortocoronary bypass graft: Secondary | ICD-10-CM

## 2018-08-04 DIAGNOSIS — I739 Peripheral vascular disease, unspecified: Secondary | ICD-10-CM | POA: Diagnosis present

## 2018-08-04 DIAGNOSIS — L02611 Cutaneous abscess of right foot: Secondary | ICD-10-CM

## 2018-08-04 DIAGNOSIS — E1169 Type 2 diabetes mellitus with other specified complication: Secondary | ICD-10-CM | POA: Diagnosis present

## 2018-08-04 DIAGNOSIS — K219 Gastro-esophageal reflux disease without esophagitis: Secondary | ICD-10-CM | POA: Diagnosis present

## 2018-08-04 DIAGNOSIS — Z992 Dependence on renal dialysis: Secondary | ICD-10-CM

## 2018-08-04 DIAGNOSIS — L03031 Cellulitis of right toe: Secondary | ICD-10-CM | POA: Diagnosis present

## 2018-08-04 DIAGNOSIS — Z794 Long term (current) use of insulin: Secondary | ICD-10-CM

## 2018-08-04 DIAGNOSIS — M869 Osteomyelitis, unspecified: Secondary | ICD-10-CM | POA: Diagnosis present

## 2018-08-04 DIAGNOSIS — F17210 Nicotine dependence, cigarettes, uncomplicated: Secondary | ICD-10-CM | POA: Diagnosis present

## 2018-08-04 DIAGNOSIS — Z7982 Long term (current) use of aspirin: Secondary | ICD-10-CM

## 2018-08-04 DIAGNOSIS — E1165 Type 2 diabetes mellitus with hyperglycemia: Secondary | ICD-10-CM | POA: Diagnosis present

## 2018-08-04 DIAGNOSIS — Z7902 Long term (current) use of antithrombotics/antiplatelets: Secondary | ICD-10-CM

## 2018-08-04 LAB — COMPREHENSIVE METABOLIC PANEL
ALK PHOS: 54 U/L (ref 38–126)
ALT: 12 U/L (ref 0–44)
AST: 13 U/L — ABNORMAL LOW (ref 15–41)
Albumin: 2.8 g/dL — ABNORMAL LOW (ref 3.5–5.0)
Anion gap: 21 — ABNORMAL HIGH (ref 5–15)
BILIRUBIN TOTAL: 0.7 mg/dL (ref 0.3–1.2)
BUN: 54 mg/dL — ABNORMAL HIGH (ref 6–20)
CALCIUM: 8.7 mg/dL — AB (ref 8.9–10.3)
CO2: 19 mmol/L — AB (ref 22–32)
CREATININE: 12.31 mg/dL — AB (ref 0.61–1.24)
Chloride: 94 mmol/L — ABNORMAL LOW (ref 98–111)
GFR calc Af Amer: 5 mL/min — ABNORMAL LOW (ref >60)
GFR calc non Af Amer: 4 mL/min — ABNORMAL LOW (ref >60)
Glucose, Bld: 308 mg/dL — ABNORMAL HIGH (ref 70–99)
Potassium: 4.2 mmol/L (ref 3.5–5.1)
SODIUM: 134 mmol/L — AB (ref 135–145)
TOTAL PROTEIN: 8 g/dL (ref 6.5–8.1)

## 2018-08-04 LAB — CBC WITH DIFFERENTIAL/PLATELET
ABS IMMATURE GRANULOCYTES: 0.02 10*3/uL (ref 0.00–0.07)
Basophils Absolute: 0.1 10*3/uL (ref 0.0–0.1)
Basophils Relative: 1 %
EOS PCT: 2 %
Eosinophils Absolute: 0.2 10*3/uL (ref 0.0–0.5)
HEMATOCRIT: 42.6 % (ref 39.0–52.0)
HEMOGLOBIN: 13.4 g/dL (ref 13.0–17.0)
Immature Granulocytes: 0 %
LYMPHS ABS: 1.7 10*3/uL (ref 0.7–4.0)
LYMPHS PCT: 19 %
MCH: 27.2 pg (ref 26.0–34.0)
MCHC: 31.5 g/dL (ref 30.0–36.0)
MCV: 86.4 fL (ref 80.0–100.0)
MONO ABS: 0.7 10*3/uL (ref 0.1–1.0)
MONOS PCT: 8 %
NEUTROS ABS: 6.1 10*3/uL (ref 1.7–7.7)
Neutrophils Relative %: 70 %
Platelets: 423 10*3/uL — ABNORMAL HIGH (ref 150–400)
RBC: 4.93 MIL/uL (ref 4.22–5.81)
RDW: 14.9 % (ref 11.5–15.5)
WBC: 8.8 10*3/uL (ref 4.0–10.5)
nRBC: 0 % (ref 0.0–0.2)

## 2018-08-04 LAB — TROPONIN I: Troponin I: 0.03 ng/mL (ref <0.03)

## 2018-08-04 LAB — C-REACTIVE PROTEIN: CRP: 6.8 mg/dL — AB (ref <1.0)

## 2018-08-04 LAB — LACTIC ACID, PLASMA: Lactic Acid, Venous: 1.8 mmol/L (ref 0.5–1.9)

## 2018-08-04 LAB — SEDIMENTATION RATE: SED RATE: 44 mm/h — AB (ref 0–16)

## 2018-08-04 MED ORDER — HYDROMORPHONE HCL 1 MG/ML IJ SOLN
1.0000 mg | Freq: Once | INTRAMUSCULAR | Status: AC
  Administered 2018-08-04: 1 mg via INTRAVENOUS
  Filled 2018-08-04: qty 1

## 2018-08-04 MED ORDER — PIPERACILLIN-TAZOBACTAM 3.375 G IVPB 30 MIN
3.3750 g | Freq: Once | INTRAVENOUS | Status: AC
  Administered 2018-08-04: 3.375 g via INTRAVENOUS
  Filled 2018-08-04: qty 50

## 2018-08-04 MED ORDER — VANCOMYCIN HCL IN DEXTROSE 1-5 GM/200ML-% IV SOLN
1000.0000 mg | Freq: Once | INTRAVENOUS | Status: AC
  Administered 2018-08-04: 1000 mg via INTRAVENOUS
  Filled 2018-08-04: qty 200

## 2018-08-04 NOTE — ED Notes (Signed)
IV attempted x's 2 without success.  2'nd RN attempting IV

## 2018-08-04 NOTE — ED Notes (Signed)
Pt to xray

## 2018-08-04 NOTE — ED Triage Notes (Signed)
Per ems, pt from home. Pt has been c/o rt pinky toe pain for the last two weeks, other amputated toes. Toe is black in color, nail gone, has been seen by a doctor for it. Pt has left BKA. Pt is a dialysis pt, MWF, did not go today. EMS VS CBG 291, BP 200/100, HR 86, 97% room air.

## 2018-08-04 NOTE — ED Provider Notes (Signed)
[facility] EMERGENCY DEPARTMENT Provider Note   CSN: [id number] Arrival date & time: 08/04/18  1940     History   Chief Complaint Chief Complaint  Patient presents with  . Toe Pain    HPI [name] Jowett Sr. is a 42 y.o. male.  HPI   Patient is a 42 year old male with a past medical history of CAD status post CABG currently on aspirin and Plavix, ESRD currently on HD MWF who did not go to his most recent appointment today on 12/20, PVD status post left BKA, CHF, GERD, HTN, and DM who presents for evaluation of chest pain and right fifth toe pain.  Regarding the patient's chest pain he states it began approximately 4 PM and has been intermittent since it began.  He describes it as substernal.  It does not radiate and is not associated with shortness of breath, cough, fever, nausea, vomiting, diarrhea, abdominal pain, or other acute thoracic or abdominal symptoms.  Regarding the patient's right fifth toe patient states he has had increasing pain over the past 2 weeks but denies any recent traumatic injuries.  He denies any significant bleeding or discharge.  He states he does not know when his fifth toe turned black.  Denies any fevers or chills, headache, earache, sore throat, or other acute symptoms.  Past Medical History:  Diagnosis Date  . Anemia   . Atherosclerosis of lower extremity ([place])   . CAD (coronary artery disease)   . Chronic combined systolic and diastolic heart failure ([place])   . Complication of anesthesia   . ESRD (end stage renal disease) on dialysis [facility])    "MWF [employer]" (03/08/2017)  . GERD (gastroesophageal reflux disease)   . Heart murmur   . History of blood transfusion    "related to OR"  . Hypertension   . PONV (postoperative nausea and vomiting)   . Type II diabetes mellitus [facility])     Patient Active Problem List   Diagnosis Date Noted  . ESRD (end stage renal disease) on dialysis ([place]) 08/04/2018  . S/P CABG x 3  11/04/2017  . ACS (acute coronary syndrome) ([place]) 10/28/2017  . Flash pulmonary edema ([place]) 06/04/2017  . Hypertensive emergency 06/04/2017  . 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 S/P unilateral BKA (below knee amputation), left ([place])   . Post-operative pain   . Acute blood loss anemia   . Chronic combined systolic and diastolic CHF (congestive heart failure) ([place])   . PVD (peripheral vascular disease) ([place])   . Coronary artery disease involving native coronary artery of native heart with unstable angina pectoris ([place])   . Tobacco abuse   . Diabetic foot infection ([place]) 03/08/2017  . Osteomyelitis ([place]) 03/08/2017  . Wound dehiscence   . Diabetes mellitus with complication ([place])   . Dehiscence of amputation stump ([place]) 01/04/2017  . S/P transmetatarsal amputation of foot, left ([place]) 11/16/2016  . Gangrene of right foot ([place]) 08/02/2016  . Achilles tendon contracture, left 08/02/2016  . Anemia of renal disease 08/16/2015  . Wound infection 08/15/2015  . Diabetic ulcer of right great toe ([place]) 08/15/2015  . Pain in the chest   . Elevated troponin   . Essential hypertension   . ESRD on dialysis ([place]) 08/07/2013  . Diabetes ([place]) 08/07/2013  . Chest pain 05/16/2012  . Murmur 05/16/2012  . Renal disorder  Past Surgical History:  Procedure Laterality Date  . ABDOMINAL AORTOGRAM N/A 11/02/2016   Procedure: Abdominal Aortogram;  Surgeon: Waynetta Sandy, MD;  Location: [facility] [place] CV LAB;  Service: Cardiovascular;  Laterality: N/A;  . AMPUTATION Left 09/27/2013   Procedure: LEFT GREAT TOE AMPUTATION;  Surgeon: [name], MD;  Location: [facility];  Service: Orthopedics;  Laterality: Left;  . AMPUTATION Right 08/15/2015   Procedure: Right Great Toe Amputation;  Surgeon: [name], MD;  Location: [facility];  Service:  Orthopedics;  Laterality: Right;  . AMPUTATION Left 11/05/2016   Procedure: TRANSMETATARSAL AMPUTATION LEFT FOOT;  Surgeon: [name], MD;  Location: [facility];  Service: Orthopedics;  Laterality: Left;  . AMPUTATION Left 03/11/2017   Procedure: LEFT BELOW KNEE AMPUTATION;  Surgeon: [name], MD;  Location: [facility];  Service: Orthopedics;  Laterality: Left;  . AMPUTATION Right 03/11/2017   Procedure: RIGHT 2ND TOE AMPUTATION;  Surgeon: [name], MD;  Location: [facility];  Service: Orthopedics;  Laterality: Right;  . AV FISTULA PLACEMENT  left arm  . CORONARY ARTERY BYPASS GRAFT N/A 11/04/2017   Procedure: CORONARY ARTERY BYPASS GRAFTING (CABG) x three, using left internal mammary artery and right    leg greater saphenous vein;  Surgeon: Melrose Nakayama, MD;  Location: [facility] [facility];  Service: Open Heart Surgery;  Laterality: N/A;  . LEFT HEART CATH AND CORONARY ANGIOGRAPHY N/A 10/31/2017   Procedure: LEFT HEART CATH AND CORONARY ANGIOGRAPHY;  Surgeon: Troy Sine, MD;  Location: [place] CV LAB;  Service: Cardiovascular;  Laterality: N/A;  . LEFT HEART CATHETERIZATION WITH CORONARY ANGIOGRAM N/A 09/13/2014   Procedure: LEFT HEART CATHETERIZATION WITH CORONARY ANGIOGRAM;  Surgeon: Sinclair Grooms, MD;  Location: [facility] CATH LAB;  Service: Cardiovascular;  Laterality: N/A;  . LOWER EXTREMITY ANGIOGRAPHY Bilateral 11/02/2016   Procedure: Lower Extremity Angiography;  Surgeon: Waynetta Sandy, MD;  Location: [place] CV LAB;  Service: Cardiovascular;  Laterality: Bilateral;  . PERIPHERAL VASCULAR ATHERECTOMY Left 11/02/2016   Procedure: Peripheral Vascular Atherectomy;  Surgeon: Waynetta Sandy, MD;  Location: [place] CV LAB;  Service: Cardiovascular;  Laterality: Left;  PERONEAL  . STUMP REVISION Left 01/11/2017   Procedure: Revision Left Transmetatarsal Amputation;  Surgeon: [name], MD;  Location: [place];  Service: Orthopedics;  Laterality: Left;  . TEE WITHOUT  CARDIOVERSION N/A 11/04/2017   Procedure: TRANSESOPHAGEAL ECHOCARDIOGRAM (TEE);  Surgeon: Melrose Nakayama, MD;  Location: [place];  Service: Open Heart Surgery;  Laterality: N/A;        Home Medications    Prior to Admission medications   Medication Sig Start Date End Date Taking? Authorizing Provider  aspirin EC 81 MG tablet Take 1 tablet (81 mg total) by mouth daily. 02/07/18  Yes Burtis Junes, NP  atorvastatin (LIPITOR) 80 MG tablet Take 1 tablet (80 mg total) by mouth daily at 6 PM. 02/07/18  Yes Burtis Junes, NP  calcitRIOL (ROCALTROL) 0.25 MCG capsule Take 5 capsules (1.25 mcg total) by mouth every Monday, Wednesday, and Friday with hemodialysis. 11/09/17  Yes Conte, Tessa N, PA-C  carvedilol (COREG) 6.25 MG tablet Take 1 tablet (6.25 mg total) by mouth 2 (two) times daily with a meal. Patient taking differently: Take 6.25 mg by mouth daily.  02/07/18  Yes Burtis Junes, NP  cholecalciferol (VITAMIN D3) 25 MCG (1000 UT) tablet Take 1,000 Units by mouth daily.   Yes [provider]  clopidogrel (PLAVIX) 75 MG  tablet Take 1 tablet (75 mg total) by mouth daily. 02/07/18  Yes Burtis Junes, NP  Darbepoetin Alfa (ARANESP) 60 MCG/0.3ML SOSY injection Inject 0.3 mLs (60 mcg total) into the vein every Tuesday with hemodialysis. Patient taking differently: Inject 60 mcg into the vein See admin instructions. On Tuesday and Thursday with Dialysis 11/15/17  Yes Harriet Pho, Tessa N, PA-C  docusate sodium (COLACE) 100 MG capsule Take 100 mg by mouth daily.   Yes [provider]  glucagon (GLUCAGON EMERGENCY) 1 MG injection Inject 1 mg into the vein once as needed (low blood sugar and inability to eat or drink sugar). 08/16/15  Yes Short, Noah Delaine, MD  insulin glargine (LANTUS) 100 UNIT/ML injection Inject 0.07 mLs (7 Units total) into the skin at bedtime. Patient taking differently: Inject 6 Units into the skin at bedtime.  03/17/17  Yes Rai, Ripudeep K, MD    ipratropium-albuterol (DUONEB) 0.5-2.5 (3) MG/3ML SOLN Take 3 mLs by nebulization 3 (three) times daily as needed (for shortness of breath).    Yes [provider]  lamoTRIgine (LAMICTAL) 150 MG tablet Take 150 mg by mouth at bedtime.   Yes [provider]  lanthanum (FOSRENOL) 1000 MG chewable tablet Chew 2 tablets (2,000 mg total) by mouth 3 (three) times daily with meals. 08/16/15  Yes Short, Noah Delaine, MD  ondansetron (ZOFRAN) 4 MG tablet Take 4 mg by mouth every 6 (six) hours as needed for nausea.    Yes [provider]  sertraline (ZOLOFT) 100 MG tablet Take 50 mg by mouth daily.   Yes [provider]  calcium acetate (PHOSLO) 667 MG capsule Take 3 capsules (2,001 mg total) by mouth 3 (three) times daily with meals. Patient not taking: Reported on 08/04/2018 03/17/17   Rai, Vernelle Emerald, MD  insulin aspart (NOVOLOG) 100 UNIT/ML injection Sliding scale CBG 70 - 120: 0 units CBG 121 - 150: 1 unit,  CBG 151 - 200: 2 units,  CBG 201 - 250: 3 units,  CBG 251 - 300: 5 units,  CBG 301 - 350: 7 units,  CBG 351 - 400: 9 units   CBG > 400: 9 units and notify your MD Patient not taking: Reported on 08/04/2018 03/17/17   Mendel Corning, MD    Family History Family History  Problem Relation Age of Onset  . Heart failure Mother   . Hypertension Mother     Social History Social History   Tobacco Use  . Smoking status: Current Every Day Smoker    Packs/day: 0.50    Years: 26.00    Pack years: 13.00    Types: Cigarettes  . Smokeless tobacco: Never Used  Substance Use Topics  . Alcohol use: Yes    Comment: 03/08/2017 "haven't took a drink in over 5 years"  . Drug use: Yes    Types: Marijuana    Comment: 03/08/2017 "basically q day"      Allergies   Coconut oil   Review of Systems Review of Systems  Constitutional: Negative for chills and fever.  HENT: Negative for ear pain and sore throat.   Eyes: Negative for pain and visual disturbance.  Respiratory:  Negative for cough and shortness of breath.   Cardiovascular: Positive for chest pain. Negative for palpitations.  Gastrointestinal: Negative for abdominal pain and vomiting.  Genitourinary: Negative for dysuria and hematuria.  Musculoskeletal: Positive for arthralgias ( R foot), gait problem ( can't bear weight on RLE) and myalgias ( R foot). Negative for back pain.  Skin: Negative for color change and rash.  Neurological: Negative for seizures and syncope.  All other systems reviewed and are negative.    Physical Exam Updated Vital Signs BP (!) 186/71 (BP Location: Right Arm)   Pulse 88   Temp 98.2 F (36.8 C) (Oral)   Resp 16   Ht 6\' 2"  (1.88 m)   Wt 84.9 kg   SpO2 100%   BMI 24.03 kg/m   Physical Exam Vitals signs and nursing note reviewed.  Constitutional:      Appearance: He is well-developed.  HENT:     Head: Normocephalic and atraumatic.  Eyes:     Conjunctiva/sclera: Conjunctivae normal.  Neck:     Musculoskeletal: Neck supple.  Cardiovascular:     Rate and Rhythm: Normal rate and regular rhythm.     Heart sounds: No murmur.  Pulmonary:     Effort: Pulmonary effort is normal. No respiratory distress.     Breath sounds: Normal breath sounds.  Abdominal:     Palpations: Abdomen is soft.     Tenderness: There is no abdominal tenderness.  Feet:     Left foot:     Amputation: Left leg is amputated below knee.  Skin:    General: Skin is warm and dry.  Neurological:     Mental Status: He is alert.    Patient's fifth right toe is black in its entirety circumferentially.  There is erythema at the MTP joint with significant tenderness palpation on range of motion.  Patient otherwise is neurovascular intact in his right lower extremity with sensation intact light touch and is able to flex and extend at the ankle.  In addition he has a 1+ DP pulse.  ED Treatments / Results  Labs (all labs ordered are listed, but only abnormal results are displayed) Labs Reviewed    CBC WITH DIFFERENTIAL/PLATELET - Abnormal; Notable for the following components:      Result Value   Platelets 423 (*)    All other components within normal limits  COMPREHENSIVE METABOLIC PANEL - Abnormal; Notable for the following components:   Sodium 134 (*)    Chloride 94 (*)    CO2 19 (*)    Glucose, Bld 308 (*)    BUN 54 (*)    Creatinine, Ser 12.31 (*)    Calcium 8.7 (*)    Albumin 2.8 (*)    AST 13 (*)    GFR calc non Af Amer 4 (*)    GFR calc Af Amer 5 (*)    Anion gap 21 (*)    All other components within normal limits  TROPONIN I - Abnormal; Notable for the following components:   Troponin I 0.03 (*)    All other components within normal limits  SEDIMENTATION RATE - Abnormal; Notable for the following components:   Sed Rate 44 (*)    All other components within normal limits  C-REACTIVE PROTEIN - Abnormal; Notable for the following components:   CRP 6.8 (*)    All other components within normal limits  CULTURE, BLOOD (ROUTINE X 2)  CULTURE, BLOOD (ROUTINE X 2)  LACTIC ACID, PLASMA  LACTIC ACID, PLASMA    EKG EKG Interpretation  Date/Time:  Friday August 04 2018 20:05:26 EST Ventricular Rate:  92 PR Interval:    QRS Duration: 101 QT Interval:  407 QTC Calculation: 504 R Axis:   63 Text Interpretation:  Sinus rhythm Abnormal T, consider ischemia, diffuse leads Prolonged QT interval No significant change since last  tracing Confirmed by Isla Pence [phone]) on 08/04/2018 8:14:42 PM   Radiology Dg Chest 1 View  Result Date: 08/04/2018 CLINICAL DATA:  Recent right toe pain. Patient on dialysis. EXAM: CHEST  1 VIEW COMPARISON:  Chest radiograph performed 07/12/2018 FINDINGS: The lungs are well-aerated. Minimal bibasilar atelectasis is noted. There is no evidence of pleural effusion or pneumothorax. The cardiomediastinal silhouette is borderline normal in size. The patient is status post median sternotomy, with evidence of prior CABG. No acute osseous  abnormalities are seen. IMPRESSION: Minimal bibasilar atelectasis noted; lungs otherwise clear. Electronically Signed   By: [name] M.D.   On: 08/04/2018 21:28   Dg Foot Complete Right  Result Date: 08/04/2018 CLINICAL DATA:  Subacute onset of right fifth toe pain. History of toe amputation. Patient on dialysis. EXAM: RIGHT FOOT COMPLETE - 3+ VIEW COMPARISON:  Right foot radiographs performed 03/07/2017 FINDINGS: There is no evidence of fracture or dislocation. The joint spaces are preserved. No definite osseous erosions are seen to suggest osteomyelitis, though evaluation for osteomyelitis is limited on radiograph. There is no evidence of talar subluxation; the subtalar joint is unremarkable in appearance. Diffuse vascular calcifications are seen. The patient is status post amputation at the first and second toes. IMPRESSION: 1. No evidence of fracture or dislocation. 2. No definite osseous erosions seen to suggest osteomyelitis, though evaluation for osteomyelitis is limited on radiograph. MRI could be considered for further evaluation to assess for osteomyelitis, as deemed clinically appropriate. 3. Diffuse vascular calcifications seen. Electronically Signed   By: [name] M.D.   On: 08/04/2018 21:30    Procedures Procedures (including critical care time)  Medications Ordered in ED Medications  HYDROmorphone (DILAUDID) injection 1 mg (1 mg Intravenous Given 08/04/18 2214)  piperacillin-tazobactam (ZOSYN) IVPB 3.375 g (0 g Intravenous Stopped 08/04/18 2247)  vancomycin (VANCOCIN) IVPB 1000 mg/200 mL premix (0 mg Intravenous Stopped 08/04/18 2353)     Initial Impression / Assessment and Plan / ED Course  I have reviewed the triage vital signs and the nursing notes.  Pertinent labs & imaging results that were available during my care of the patient were reviewed by me and considered in my medical decision making (see chart for details).     Patient is a 42 year old male who  presents with above-stated skin exam.  On presentation patient is afebrile stable vital signs.  Exam as above remarkable for black fifth toe on the right that is warm and tender to palpation at the MTP joint with some surrounding erythema.  This is concerning for cellulitis with possible early osteomyelitis.  Sed rate 44.  CRP 6.8.  X-ray of the patient's right foot shows "no evidence of fracture or dislocation.  No definitive osseous erosions to suggest osteomyelitis."  While no obvious findings consistent with osteomyelitis are seen on x-ray, I believe the patient requires admission for IV antibiotics for cellulitis with possible MRI for further evaluation of concern for osteomyelitis.  Regarding the patient's chest pain, I have low suspicion for ACS given EKG shows a ventricular rate of 92 with a QTc interval of 504 and nonspecific T wave changes in the lateral leads that appeared to be consistent with prior tracings and initial troponin of 0.03 which is the lowest it has been all year..  ASA in the ED deferred as patient stated he had already received 325 of aspirin prior to arrival.  CMP remarkable for NA 134, K4.2, CL 94, CO2 19, glucose 308, creatinine 12.31 which appears consistent with prior  levels, unremarkable LFTs.  I have a low suspicion for sepsis given patient is not tachycardic, hypotensive, tachypneic and is afebrile with a lactic acid of 1.8.  Chest x-ray is not suggestive of pneumothorax, pneumonia, pulmonary edema, pleural effusion, or other acute intrathoracic abnormality within the limits of the study.   Patient admitted in stable condition.  Final Clinical Impressions(s) / ED Diagnoses   Final diagnoses:  Toe pain, right  Abscess or cellulitis, toe, right    ED Discharge Orders    None       Hulan Saas, MD 08/05/18 3016    Isla Pence, MD 08/06/18 0002

## 2018-08-05 ENCOUNTER — Inpatient Hospital Stay (HOSPITAL_COMMUNITY): Payer: Medicaid Other

## 2018-08-05 ENCOUNTER — Encounter (HOSPITAL_COMMUNITY): Payer: Self-pay | Admitting: Internal Medicine

## 2018-08-05 ENCOUNTER — Observation Stay (HOSPITAL_BASED_OUTPATIENT_CLINIC_OR_DEPARTMENT_OTHER): Payer: Medicaid Other

## 2018-08-05 DIAGNOSIS — I96 Gangrene, not elsewhere classified: Secondary | ICD-10-CM

## 2018-08-05 DIAGNOSIS — D631 Anemia in chronic kidney disease: Secondary | ICD-10-CM | POA: Diagnosis present

## 2018-08-05 DIAGNOSIS — L03031 Cellulitis of right toe: Secondary | ICD-10-CM | POA: Diagnosis present

## 2018-08-05 DIAGNOSIS — Z7982 Long term (current) use of aspirin: Secondary | ICD-10-CM | POA: Diagnosis not present

## 2018-08-05 DIAGNOSIS — M869 Osteomyelitis, unspecified: Secondary | ICD-10-CM | POA: Diagnosis present

## 2018-08-05 DIAGNOSIS — Z992 Dependence on renal dialysis: Secondary | ICD-10-CM

## 2018-08-05 DIAGNOSIS — T79A0XA Compartment syndrome, unspecified, initial encounter: Secondary | ICD-10-CM | POA: Diagnosis present

## 2018-08-05 DIAGNOSIS — Z89421 Acquired absence of other right toe(s): Secondary | ICD-10-CM | POA: Diagnosis not present

## 2018-08-05 DIAGNOSIS — M79A21 Nontraumatic compartment syndrome of right lower extremity: Secondary | ICD-10-CM | POA: Diagnosis not present

## 2018-08-05 DIAGNOSIS — N186 End stage renal disease: Secondary | ICD-10-CM

## 2018-08-05 DIAGNOSIS — I132 Hypertensive heart and chronic kidney disease with heart failure and with stage 5 chronic kidney disease, or end stage renal disease: Secondary | ICD-10-CM | POA: Diagnosis present

## 2018-08-05 DIAGNOSIS — E1152 Type 2 diabetes mellitus with diabetic peripheral angiopathy with gangrene: Secondary | ICD-10-CM | POA: Diagnosis present

## 2018-08-05 DIAGNOSIS — F39 Unspecified mood [affective] disorder: Secondary | ICD-10-CM | POA: Diagnosis present

## 2018-08-05 DIAGNOSIS — Z951 Presence of aortocoronary bypass graft: Secondary | ICD-10-CM | POA: Diagnosis not present

## 2018-08-05 DIAGNOSIS — D62 Acute posthemorrhagic anemia: Secondary | ICD-10-CM | POA: Diagnosis not present

## 2018-08-05 DIAGNOSIS — E1122 Type 2 diabetes mellitus with diabetic chronic kidney disease: Secondary | ICD-10-CM

## 2018-08-05 DIAGNOSIS — E871 Hypo-osmolality and hyponatremia: Secondary | ICD-10-CM | POA: Diagnosis not present

## 2018-08-05 DIAGNOSIS — E785 Hyperlipidemia, unspecified: Secondary | ICD-10-CM | POA: Diagnosis present

## 2018-08-05 DIAGNOSIS — X58XXXA Exposure to other specified factors, initial encounter: Secondary | ICD-10-CM | POA: Diagnosis present

## 2018-08-05 DIAGNOSIS — K219 Gastro-esophageal reflux disease without esophagitis: Secondary | ICD-10-CM | POA: Diagnosis present

## 2018-08-05 DIAGNOSIS — I5042 Chronic combined systolic (congestive) and diastolic (congestive) heart failure: Secondary | ICD-10-CM | POA: Diagnosis present

## 2018-08-05 DIAGNOSIS — F17210 Nicotine dependence, cigarettes, uncomplicated: Secondary | ICD-10-CM | POA: Diagnosis present

## 2018-08-05 DIAGNOSIS — E1029 Type 1 diabetes mellitus with other diabetic kidney complication: Secondary | ICD-10-CM | POA: Diagnosis present

## 2018-08-05 DIAGNOSIS — E1169 Type 2 diabetes mellitus with other specified complication: Secondary | ICD-10-CM | POA: Diagnosis present

## 2018-08-05 DIAGNOSIS — M79671 Pain in right foot: Secondary | ICD-10-CM | POA: Diagnosis present

## 2018-08-05 DIAGNOSIS — Z7902 Long term (current) use of antithrombotics/antiplatelets: Secondary | ICD-10-CM | POA: Diagnosis not present

## 2018-08-05 DIAGNOSIS — Z794 Long term (current) use of insulin: Secondary | ICD-10-CM

## 2018-08-05 DIAGNOSIS — I2510 Atherosclerotic heart disease of native coronary artery without angina pectoris: Secondary | ICD-10-CM | POA: Diagnosis present

## 2018-08-05 DIAGNOSIS — Z89512 Acquired absence of left leg below knee: Secondary | ICD-10-CM | POA: Diagnosis not present

## 2018-08-05 DIAGNOSIS — E1165 Type 2 diabetes mellitus with hyperglycemia: Secondary | ICD-10-CM | POA: Diagnosis present

## 2018-08-05 LAB — BASIC METABOLIC PANEL
ANION GAP: 18 — AB (ref 5–15)
BUN: 59 mg/dL — ABNORMAL HIGH (ref 6–20)
CO2: 20 mmol/L — ABNORMAL LOW (ref 22–32)
Calcium: 8.5 mg/dL — ABNORMAL LOW (ref 8.9–10.3)
Chloride: 95 mmol/L — ABNORMAL LOW (ref 98–111)
Creatinine, Ser: 12.85 mg/dL — ABNORMAL HIGH (ref 0.61–1.24)
GFR calc Af Amer: 5 mL/min — ABNORMAL LOW (ref >60)
GFR calc non Af Amer: 4 mL/min — ABNORMAL LOW (ref >60)
Glucose, Bld: 227 mg/dL — ABNORMAL HIGH (ref 70–99)
Potassium: 4.3 mmol/L (ref 3.5–5.1)
Sodium: 133 mmol/L — ABNORMAL LOW (ref 135–145)

## 2018-08-05 LAB — CBC
HCT: 37.6 % — ABNORMAL LOW (ref 39.0–52.0)
Hemoglobin: 11.7 g/dL — ABNORMAL LOW (ref 13.0–17.0)
MCH: 26.8 pg (ref 26.0–34.0)
MCHC: 31.1 g/dL (ref 30.0–36.0)
MCV: 86 fL (ref 80.0–100.0)
PLATELETS: 461 10*3/uL — AB (ref 150–400)
RBC: 4.37 MIL/uL (ref 4.22–5.81)
RDW: 14.7 % (ref 11.5–15.5)
WBC: 10.8 10*3/uL — AB (ref 4.0–10.5)
nRBC: 0 % (ref 0.0–0.2)

## 2018-08-05 LAB — MRSA PCR SCREENING: MRSA by PCR: POSITIVE — AB

## 2018-08-05 LAB — TYPE AND SCREEN
ABO/RH(D): B POS
ANTIBODY SCREEN: NEGATIVE

## 2018-08-05 LAB — GLUCOSE, CAPILLARY
Glucose-Capillary: 207 mg/dL — ABNORMAL HIGH (ref 70–99)
Glucose-Capillary: 114 mg/dL — ABNORMAL HIGH (ref 70–99)
Glucose-Capillary: 120 mg/dL — ABNORMAL HIGH (ref 70–99)
Glucose-Capillary: 297 mg/dL — ABNORMAL HIGH (ref 70–99)

## 2018-08-05 MED ORDER — LANTHANUM CARBONATE 500 MG PO CHEW
2000.0000 mg | CHEWABLE_TABLET | Freq: Three times a day (TID) | ORAL | Status: DC
  Administered 2018-08-05 – 2018-08-09 (×6): 2000 mg via ORAL
  Administered 2018-08-09 (×2): 1000 mg via ORAL
  Administered 2018-08-10 – 2018-08-16 (×12): 2000 mg via ORAL
  Filled 2018-08-05 (×27): qty 4

## 2018-08-05 MED ORDER — VANCOMYCIN HCL IN DEXTROSE 1-5 GM/200ML-% IV SOLN
1000.0000 mg | INTRAVENOUS | Status: DC
  Administered 2018-08-06: 1000 mg via INTRAVENOUS
  Filled 2018-08-05: qty 200

## 2018-08-05 MED ORDER — CHLORHEXIDINE GLUCONATE CLOTH 2 % EX PADS
6.0000 | MEDICATED_PAD | Freq: Every day | CUTANEOUS | Status: DC
  Administered 2018-08-07 – 2018-08-16 (×6): 6 via TOPICAL

## 2018-08-05 MED ORDER — INSULIN ASPART 100 UNIT/ML ~~LOC~~ SOLN
0.0000 [IU] | Freq: Three times a day (TID) | SUBCUTANEOUS | Status: DC
  Administered 2018-08-06: 2 [IU] via SUBCUTANEOUS
  Administered 2018-08-06: 3 [IU] via SUBCUTANEOUS
  Administered 2018-08-07: 5 [IU] via SUBCUTANEOUS
  Administered 2018-08-08 (×2): 3 [IU] via SUBCUTANEOUS
  Administered 2018-08-08: 2 [IU] via SUBCUTANEOUS
  Administered 2018-08-09: 5 [IU] via SUBCUTANEOUS
  Administered 2018-08-09: 2 [IU] via SUBCUTANEOUS
  Administered 2018-08-09: 9 [IU] via SUBCUTANEOUS
  Administered 2018-08-10: 5 [IU] via SUBCUTANEOUS
  Administered 2018-08-10 – 2018-08-11 (×2): 3 [IU] via SUBCUTANEOUS

## 2018-08-05 MED ORDER — ATORVASTATIN CALCIUM 80 MG PO TABS
80.0000 mg | ORAL_TABLET | Freq: Every day | ORAL | Status: DC
  Administered 2018-08-05 – 2018-08-16 (×12): 80 mg via ORAL
  Filled 2018-08-05 (×14): qty 1

## 2018-08-05 MED ORDER — HYDROMORPHONE HCL 1 MG/ML IJ SOLN
1.0000 mg | Freq: Once | INTRAMUSCULAR | Status: AC
  Administered 2018-08-05: 1 mg via INTRAVENOUS
  Filled 2018-08-05: qty 1

## 2018-08-05 MED ORDER — VANCOMYCIN HCL IN DEXTROSE 1-5 GM/200ML-% IV SOLN
1000.0000 mg | Freq: Once | INTRAVENOUS | Status: AC
  Administered 2018-08-05: 1000 mg via INTRAVENOUS
  Filled 2018-08-05: qty 200

## 2018-08-05 MED ORDER — LAMOTRIGINE 25 MG PO TABS
150.0000 mg | ORAL_TABLET | Freq: Every day | ORAL | Status: DC
  Administered 2018-08-05 – 2018-08-16 (×11): 150 mg via ORAL
  Filled 2018-08-05: qty 6
  Filled 2018-08-05: qty 1
  Filled 2018-08-05 (×5): qty 6
  Filled 2018-08-05: qty 1
  Filled 2018-08-05 (×3): qty 6

## 2018-08-05 MED ORDER — INSULIN ASPART 100 UNIT/ML ~~LOC~~ SOLN
0.0000 [IU] | SUBCUTANEOUS | Status: DC
  Administered 2018-08-05: 3 [IU] via SUBCUTANEOUS

## 2018-08-05 MED ORDER — ACETAMINOPHEN 650 MG RE SUPP: 650.0000 mg | Freq: Four times a day (QID) | RECTAL | Status: DC | PRN

## 2018-08-05 MED ORDER — ONDANSETRON HCL 4 MG PO TABS: 4.0000 mg | ORAL_TABLET | Freq: Four times a day (QID) | ORAL | Status: DC | PRN

## 2018-08-05 MED ORDER — SODIUM CHLORIDE 0.9 % IV SOLN
INTRAVENOUS | Status: DC | PRN
  Administered 2018-08-05: 250 mL via INTRAVENOUS

## 2018-08-05 MED ORDER — ASPIRIN EC 81 MG PO TBEC
81.0000 mg | DELAYED_RELEASE_TABLET | Freq: Every day | ORAL | Status: DC
  Administered 2018-08-05 – 2018-08-17 (×13): 81 mg via ORAL
  Filled 2018-08-05 (×13): qty 1

## 2018-08-05 MED ORDER — INSULIN GLARGINE 100 UNIT/ML ~~LOC~~ SOLN
6.0000 [IU] | Freq: Every day | SUBCUTANEOUS | Status: DC
  Administered 2018-08-05 – 2018-08-09 (×5): 6 [IU] via SUBCUTANEOUS
  Filled 2018-08-05 (×6): qty 0.06

## 2018-08-05 MED ORDER — HYDRALAZINE HCL 20 MG/ML IJ SOLN
5.0000 mg | INTRAMUSCULAR | Status: DC | PRN
  Administered 2018-08-05: 5 mg via INTRAVENOUS
  Filled 2018-08-05: qty 1

## 2018-08-05 MED ORDER — SERTRALINE HCL 50 MG PO TABS
50.0000 mg | ORAL_TABLET | Freq: Every day | ORAL | Status: DC
  Administered 2018-08-05 – 2018-08-17 (×13): 50 mg via ORAL
  Filled 2018-08-05 (×13): qty 1

## 2018-08-05 MED ORDER — ONDANSETRON HCL 4 MG/2ML IJ SOLN
4.0000 mg | Freq: Four times a day (QID) | INTRAMUSCULAR | Status: DC | PRN
  Administered 2018-08-08 – 2018-08-14 (×3): 4 mg via INTRAVENOUS
  Filled 2018-08-05 (×3): qty 2

## 2018-08-05 MED ORDER — CARVEDILOL 6.25 MG PO TABS
6.2500 mg | ORAL_TABLET | Freq: Every day | ORAL | Status: DC
  Administered 2018-08-05 – 2018-08-09 (×4): 6.25 mg via ORAL
  Filled 2018-08-05 (×5): qty 1

## 2018-08-05 MED ORDER — HYDROMORPHONE HCL 1 MG/ML IJ SOLN
INTRAMUSCULAR | Status: AC
  Administered 2018-08-05: 1 mg via INTRAVENOUS
  Filled 2018-08-05: qty 1

## 2018-08-05 MED ORDER — CALCITRIOL 0.25 MCG PO CAPS
1.2500 ug | ORAL_CAPSULE | ORAL | Status: DC
  Administered 2018-08-06 – 2018-08-08 (×2): 125 ug via ORAL
  Administered 2018-08-11 – 2018-08-16 (×3): 1.25 ug via ORAL
  Filled 2018-08-05 (×2): qty 5

## 2018-08-05 MED ORDER — CLOPIDOGREL BISULFATE 75 MG PO TABS
75.0000 mg | ORAL_TABLET | Freq: Every day | ORAL | Status: DC
  Administered 2018-08-05 – 2018-08-17 (×13): 75 mg via ORAL
  Filled 2018-08-05 (×13): qty 1

## 2018-08-05 MED ORDER — ACETAMINOPHEN 325 MG PO TABS
650.0000 mg | ORAL_TABLET | Freq: Four times a day (QID) | ORAL | Status: DC | PRN
  Administered 2018-08-05 – 2018-08-10 (×3): 650 mg via ORAL
  Filled 2018-08-05 (×3): qty 2

## 2018-08-05 MED ORDER — PIPERACILLIN-TAZOBACTAM 3.375 G IVPB
3.3750 g | Freq: Two times a day (BID) | INTRAVENOUS | Status: DC
  Administered 2018-08-05 – 2018-08-11 (×11): 3.375 g via INTRAVENOUS
  Filled 2018-08-05 (×15): qty 50

## 2018-08-05 MED ORDER — HYDROMORPHONE HCL 1 MG/ML IJ SOLN
1.0000 mg | INTRAMUSCULAR | Status: DC | PRN
  Administered 2018-08-05 – 2018-08-17 (×74): 1 mg via INTRAVENOUS
  Filled 2018-08-05 (×67): qty 1

## 2018-08-05 NOTE — Consult Note (Addendum)
[facility] Renal Consultation Note    Indication for Consultation:  Management of ESRD/hemodialysis, anemia, hypertension/volume, and secondary hyperparathyroidism. PCP:  HPI: [name] Harbuck Sr. is a 42 y.o. male with ESRD, HTN, CAD (s/p CABG 10/2017), Type 2 DM, and PAD (s/p L BKA) who was admitted with R 5th toe gangrene.   He reports that he presented to the ED on 12/20 after noticing his R 5th toe getting darker and more painful for 2 weeks. There was also concern of chest pain. Denied dyspnea, abdominal symptoms, fever or chills. In ED, labs showed  WBC 8.8, Hgb 13.4, K 4.2, trop 0.03, CRP 6.8. EKG without acute changes. CXR clear. Blood cultures drawn, he was started on IV Vanc/ZOsyn and admitted. Vascular surgery consulted and went for ABI of R leg this morning, although looks like was not done due to calcified, non-compressible vessels.  Seen this morning on dialysis. No chest pain at this time. No new symptoms or fever.  Dialyzes on MWF schedule at [facility]. Missed HD yesterday d/t foot pain. Last HD 12/18, but signed off early after 2:14hr, left 2.1kg above EDW. No recent AVF issues. Ongoing, fluctuating compliance with HD.  Past Medical History:  Diagnosis Date  . Anemia   . Atherosclerosis of lower extremity ([place])   . CAD (coronary artery disease)   . Chronic combined systolic and diastolic heart failure ([place])   . Complication of anesthesia   . ESRD (end stage renal disease) on dialysis [facility])    "MWF [employer]" (03/08/2017)  . GERD (gastroesophageal reflux disease)   . Heart murmur   . History of blood transfusion    "related to OR"  . Hypertension   . PONV (postoperative nausea and vomiting)   . Type II diabetes mellitus ([place])    Past Surgical History:  Procedure Laterality Date  . ABDOMINAL AORTOGRAM N/A 11/02/2016   Procedure: Abdominal Aortogram;  Surgeon: Waynetta Sandy, MD;  Location: [place] CV [facility];  Service: Cardiovascular;   Laterality: N/A;  . AMPUTATION Left 09/27/2013   Procedure: LEFT GREAT TOE AMPUTATION;  Surgeon: [name], MD;  Location: [place];  Service: Orthopedics;  Laterality: Left;  . AMPUTATION Right 08/15/2015   Procedure: Right Great Toe Amputation;  Surgeon: [name], MD;  Location: [place];  Service: Orthopedics;  Laterality: Right;  . AMPUTATION Left 11/05/2016   Procedure: TRANSMETATARSAL AMPUTATION LEFT FOOT;  Surgeon: [name], MD;  Location: [place];  Service: Orthopedics;  Laterality: Left;  . AMPUTATION Left 03/11/2017   Procedure: LEFT BELOW KNEE AMPUTATION;  Surgeon: [name], MD;  Location: [place];  Service: Orthopedics;  Laterality: Left;  . AMPUTATION Right 03/11/2017   Procedure: RIGHT 2ND TOE AMPUTATION;  Surgeon: [name], MD;  Location: [place];  Service: Orthopedics;  Laterality: Right;  . AV FISTULA PLACEMENT  left arm  . CORONARY ARTERY BYPASS GRAFT N/A 11/04/2017   Procedure: CORONARY ARTERY BYPASS GRAFTING (CABG) x three, using left internal mammary artery and right    leg greater saphenous vein;  Surgeon: Melrose Nakayama, MD;  Location: [place];  Service: Open Heart Surgery;  Laterality: N/A;  . LEFT HEART CATH AND CORONARY ANGIOGRAPHY N/A 10/31/2017   Procedure: LEFT HEART CATH AND CORONARY ANGIOGRAPHY;  Surgeon: Troy Sine, MD;  Location: [place] [facility];  Service: Cardiovascular;  Laterality: N/A;  . LEFT HEART CATHETERIZATION WITH CORONARY ANGIOGRAM N/A 09/13/2014   Procedure: LEFT HEART CATHETERIZATION WITH CORONARY ANGIOGRAM;  Surgeon: Sinclair Grooms, MD;  Location: [facility] CATH LAB;  Service: Cardiovascular;  Laterality: N/A;  . LOWER EXTREMITY ANGIOGRAPHY Bilateral 11/02/2016   Procedure: Lower Extremity Angiography;  Surgeon: Waynetta Sandy, MD;  Location: [place] CV [facility];  Service: Cardiovascular;  Laterality: Bilateral;  . PERIPHERAL VASCULAR ATHERECTOMY Left 11/02/2016   Procedure: Peripheral Vascular Atherectomy;  Surgeon: Waynetta Sandy, MD;  Location: [place] CV [facility];  Service: Cardiovascular;  Laterality: Left;  PERONEAL  . STUMP REVISION Left 01/11/2017   Procedure: Revision Left Transmetatarsal Amputation;  Surgeon: [name], MD;  Location: [place];  Service: Orthopedics;  Laterality: Left;  . TEE WITHOUT CARDIOVERSION N/A 11/04/2017   Procedure: TRANSESOPHAGEAL ECHOCARDIOGRAM (TEE);  Surgeon: Melrose Nakayama, MD;  Location: [place];  Service: Open Heart Surgery;  Laterality: N/A;   Family History  Problem Relation Age of Onset  . Heart failure Mother   . Hypertension Mother    Social History:  reports that he has been smoking cigarettes. He has a 13.00 pack-year smoking history. He has never used smokeless tobacco. He reports current alcohol use. He reports current drug use. Drug: Marijuana.  ROS: As per HPI otherwise negative.  Physical Exam: Vitals:   08/05/18 0103 08/05/18 0129 08/05/18 0216 08/05/18 0521  BP: (!) 206/78 (!) 189/41 (!) 182/86 (!) 177/77  Pulse: 83 80 85 91  Resp: 18 18  18   Temp: 99.4 F (37.4 C)   98.8 F (37.1 C)  TempSrc: Oral   Oral  SpO2: 98%  99% 100%  Weight: 85.2 kg     Height:         General: Well developed, well nourished, in no acute distress. Head: Normocephalic, atraumatic, sclera non-icteric, mucus membranes are moist. Neck: Supple without lymphadenopathy/masses. JVD not elevated. Lungs: Clear bilaterally to auscultation without wheezes, rales, or rhonchi. Breathing is unlabored. Heart: RRR with normal S1, S2. No murmurs, rubs, or gallops appreciated. Abdomen: Soft, non-tender, non-distended with normoactive bowel sounds.  Musculoskeletal:  Strength and tone appear normal for age. Lower extremities:  L BKA. RLE without edema, missing 1st/2nd toes (prior amputations), R 5th digit blackened/gangrene present. Neuro: Alert and oriented X 3. Moves all extremities spontaneously. Psych:  Responds to questions appropriately with a normal  affect. Dialysis Access: L AVF + thrill (cannulated)  Allergies  Allergen Reactions  . Coconut Oil Anaphylaxis    Can use topically, allergic to coconut foods    Prior to Admission medications   Medication Sig Start Date End Date Taking? Authorizing Provider  aspirin EC 81 MG tablet Take 1 tablet (81 mg total) by mouth daily. 02/07/18  Yes Burtis Junes, NP  atorvastatin (LIPITOR) 80 MG tablet Take 1 tablet (80 mg total) by mouth daily at 6 PM. 02/07/18  Yes Burtis Junes, NP  calcitRIOL (ROCALTROL) 0.25 MCG capsule Take 5 capsules (1.25 mcg total) by mouth every Monday, Wednesday, and Friday with hemodialysis. 11/09/17  Yes Conte, Tessa N, PA-C  carvedilol (COREG) 6.25 MG tablet Take 1 tablet (6.25 mg total) by mouth 2 (two) times daily with a meal. Patient taking differently: Take 6.25 mg by mouth daily.  02/07/18  Yes Burtis Junes, NP  cholecalciferol (VITAMIN D3) 25 MCG (1000 UT) tablet Take 1,000 Units by mouth daily.   Yes [provider]  clopidogrel (PLAVIX) 75 MG tablet Take 1 tablet (75 mg total) by mouth daily. 02/07/18  Yes Burtis Junes, NP  Darbepoetin Alfa (ARANESP) 60 MCG/0.3ML SOSY injection  Inject 0.3 mLs (60 mcg total) into the vein every Tuesday with hemodialysis. Patient taking differently: Inject 60 mcg into the vein See admin instructions. On Tuesday and Thursday with Dialysis 11/15/17  Yes Harriet Pho, Tessa N, PA-C  docusate sodium (COLACE) 100 MG capsule Take 100 mg by mouth daily.   Yes [provider]  glucagon (GLUCAGON EMERGENCY) 1 MG injection Inject 1 mg into the vein once as needed (low blood sugar and inability to eat or drink sugar). 08/16/15  Yes Short, Noah Delaine, MD  insulin glargine (LANTUS) 100 UNIT/ML injection Inject 0.07 mLs (7 Units total) into the skin at bedtime. Patient taking differently: Inject 6 Units into the skin at bedtime.  03/17/17  Yes Rai, Ripudeep K, MD  ipratropium-albuterol (DUONEB) 0.5-2.5 (3) MG/3ML SOLN Take 3 mLs by  nebulization 3 (three) times daily as needed (for shortness of breath).    Yes [provider]  lamoTRIgine (LAMICTAL) 150 MG tablet Take 150 mg by mouth at bedtime.   Yes [provider]  lanthanum (FOSRENOL) 1000 MG chewable tablet Chew 2 tablets (2,000 mg total) by mouth 3 (three) times daily with meals. 08/16/15  Yes Short, Noah Delaine, MD  ondansetron (ZOFRAN) 4 MG tablet Take 4 mg by mouth every 6 (six) hours as needed for nausea.    Yes [provider]  sertraline (ZOLOFT) 100 MG tablet Take 50 mg by mouth daily.   Yes [provider]  calcium acetate (PHOSLO) 667 MG capsule Take 3 capsules (2,001 mg total) by mouth 3 (three) times daily with meals. Patient not taking: Reported on 08/04/2018 03/17/17   Rai, Vernelle Emerald, MD  insulin aspart (NOVOLOG) 100 UNIT/ML injection Sliding scale CBG 70 - 120: 0 units CBG 121 - 150: 1 unit,  CBG 151 - 200: 2 units,  CBG 201 - 250: 3 units,  CBG 251 - 300: 5 units,  CBG 301 - 350: 7 units,  CBG 351 - 400: 9 units   CBG > 400: 9 units and notify your MD Patient not taking: Reported on 08/04/2018 03/17/17   Mendel Corning, MD   Current Facility-Administered Medications  Medication Dose Route Frequency Provider Last Rate Last Dose  . acetaminophen (TYLENOL) tablet 650 mg  650 mg Oral Q6H PRN Rise Patience, MD   650 mg at 08/05/18 0827   Or  . acetaminophen (TYLENOL) suppository 650 mg  650 mg Rectal Q6H PRN Rise Patience, MD      . aspirin EC tablet 81 mg  81 mg Oral Daily Rise Patience, MD   81 mg at 08/05/18 0827  . atorvastatin (LIPITOR) tablet 80 mg  80 mg Oral q1800 Rise Patience, MD      . Derrill Memo ON 08/07/2018] calcitRIOL (ROCALTROL) capsule 1.25 mcg  1.25 mcg Oral Q M,W,F-HD Rise Patience, MD      . carvedilol (COREG) tablet 6.25 mg  6.25 mg Oral Daily Rise Patience, MD   6.25 mg at 08/05/18 2536  . Chlorhexidine Gluconate Cloth 2 % PADS 6 each  6 each Topical Q0600 Loren Racer, PA-C      . clopidogrel (PLAVIX) tablet 75 mg  75 mg Oral Daily Rise Patience, MD   75 mg at 08/05/18 6440  . hydrALAZINE (APRESOLINE) injection 5 mg  5 mg Intravenous Q4H PRN Rise Patience, MD   5 mg at 08/05/18 0137  . HYDROmorphone (DILAUDID) injection 1 mg  1 mg Intravenous Q3H PRN Dessa Phi, DO  1 mg at 08/05/18 0920  . insulin aspart (novoLOG) injection 0-9 Units  0-9 Units Subcutaneous Q4H Rise Patience, MD   3 Units at 08/05/18 0359  . insulin glargine (LANTUS) injection 6 Units  6 Units Subcutaneous QHS Rise Patience, MD   6 Units at 08/05/18 0357  . lamoTRIgine (LAMICTAL) tablet 150 mg  150 mg Oral QHS Rise Patience, MD      . lanthanum [facility]) chewable tablet 2,000 mg  2,000 mg Oral TID WC Rise Patience, MD      . ondansetron [facility]) tablet 4 mg  4 mg Oral Q6H PRN Rise Patience, MD       Or  . ondansetron [facility]) injection 4 mg  4 mg Intravenous Q6H PRN Rise Patience, MD      . piperacillin-tazobactam (ZOSYN) IVPB 3.375 g  3.375 g Intravenous Q12H Erenest Blank, RPH 12.5 mL/hr at 08/05/18 0833 3.375 g at 08/05/18 0833  . sertraline (ZOLOFT) tablet 50 mg  50 mg Oral Daily Rise Patience, MD   50 mg at 08/05/18 0102  . [START ON 08/07/2018] vancomycin (VANCOCIN) IVPB 1000 mg/200 mL premix  1,000 mg Intravenous Q M,W,F-HD Erenest Blank [facility]       Labs: Basic Metabolic Panel: Recent Labs  Lab 08/04/18 2035 08/05/18 0416  NA 134* 133*  K 4.2 4.3  CL 94* 95*  CO2 19* 20*  GLUCOSE 308* 227*  BUN 54* 59*  CREATININE 12.31* 12.85*  CALCIUM 8.7* 8.5*   Liver Function Tests: Recent Labs  Lab 08/04/18 2035  AST 13*  ALT 12  ALKPHOS 54  BILITOT 0.7  PROT 8.0  ALBUMIN 2.8*   CBC: Recent Labs  Lab 08/04/18 2035 08/05/18 0416  WBC 8.8 10.8*  NEUTROABS 6.1  --   HGB 13.4 11.7*  HCT 42.6 37.6*  MCV 86.4 86.0  PLT 423* 461*   Cardiac Enzymes: Recent Labs  Lab 08/04/18 2035   TROPONINI 0.03*   CBG: Recent Labs  Lab 08/05/18 0335 08/05/18 0804  GLUCAP 207* 114*   Iron Studies: No results for input(s): IRON, TIBC, TRANSFERRIN, FERRITIN in the last 72 hours. Studies/Results: Dg Chest 1 View  Result Date: 08/04/2018 CLINICAL DATA:  Recent right toe pain. Patient on dialysis. EXAM: CHEST  1 VIEW COMPARISON:  Chest radiograph performed 07/12/2018 FINDINGS: The lungs are well-aerated. Minimal bibasilar atelectasis is noted. There is no evidence of pleural effusion or pneumothorax. The cardiomediastinal silhouette is borderline normal in size. The patient is status post median sternotomy, with evidence of prior CABG. No acute osseous abnormalities are seen. IMPRESSION: Minimal bibasilar atelectasis noted; lungs otherwise clear. Electronically Signed   By: [name] M.D.   On: 08/04/2018 21:28   Dg Foot Complete Right  Result Date: 08/04/2018 CLINICAL DATA:  Subacute onset of right fifth toe pain. History of toe amputation. Patient on dialysis. EXAM: RIGHT FOOT COMPLETE - 3+ VIEW COMPARISON:  Right foot radiographs performed 03/07/2017 FINDINGS: There is no evidence of fracture or dislocation. The joint spaces are preserved. No definite osseous erosions are seen to suggest osteomyelitis, though evaluation for osteomyelitis is limited on radiograph. There is no evidence of talar subluxation; the subtalar joint is unremarkable in appearance. Diffuse vascular calcifications are seen. The patient is status post amputation at the first and second toes. IMPRESSION: 1. No evidence of fracture or dislocation. 2. No definite osseous erosions seen to suggest osteomyelitis, though evaluation for osteomyelitis is limited on radiograph. MRI could  be considered for further evaluation to assess for osteomyelitis, as deemed clinically appropriate. 3. Diffuse vascular calcifications seen. Electronically Signed   By: [name] M.D.   On: 08/04/2018 21:30   Vas [place] Burnard Bunting With/wo  Tbi  Result Date: 08/05/2018 LOWER EXTREMITY DOPPLER STUDY Indications: Gangrene. High Risk Factors: Hypertension, Diabetes, current smoker. Other Factors: ESRD on dialysis, left access.  Vascular Interventions: Multiple toe amputations on the right. 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 Comparison Study: Prior study from 11/01/17 is available for comparison Performing Technologist: Sharion Dove RVS  Examination Guidelines: A complete evaluation includes at minimum, Doppler waveform signals and systolic blood pressure reading at the level of bilateral brachial, anterior tibial, and posterior tibial arteries, when vessel segments are accessible. Bilateral testing is considered an integral part of a complete examination. Photoelectric Plethysmograph (PPG) waveforms and toe systolic pressure readings are included as required and additional duplex testing as needed. Limited examinations for reoccurring indications may be performed as noted.  ABI Findings: +---------+------------------+-----+----------+-------------------+ Right    Rt Pressure (mmHg)IndexWaveform  Comment             +---------+------------------+-----+----------+-------------------+ Brachial 255                    biphasic  Blood pressure >255 +---------+------------------+-----+----------+-------------------+ PTA      254               1.00 monophasicNon comp            +---------+------------------+-----+----------+-------------------+ DP       213               0.84 monophasic                    +---------+------------------+-----+----------+-------------------+ Great Toe                                 amputation          +---------+------------------+-----+----------+-------------------+ +--------+------------------+-----+--------+-------+ Left    Lt Pressure (mmHg)IndexWaveformComment +--------+------------------+-----+--------+-------+ Brachial                               access   +--------+------------------+-----+--------+-------+ PTA                                    BKA     +--------+------------------+-----+--------+-------+ DP                                     BKA     +--------+------------------+-----+--------+-------+ +-------+-----------+-----------+------------+------------+ ABI/TBIToday's ABIToday's TBIPrevious ABIPrevious TBI +-------+-----------+-----------+------------+------------+ Right  non comp   Toe amp    non comp    toe amp      +-------+-----------+-----------+------------+------------+ Left   BKA                                            +-------+-----------+-----------+------------+------------+ Right ABIs appear essentially unchanged.  Summary: Right: Resting right ankle-brachial index indicates noncompressible right lower extremity arteries.  *See table(s) above for measurements and observations.  Electronically signed by Monica Martinez MD on 08/05/2018 at 11:01:37 AM.   Final     Dialysis Orders:  MWF  at [facility] 4hr, 200dialyzer, 500/800, EDW 80kg, 2K/2Ca, UFP #2, AVF, Heparin 2400 bolus - Calcitriol 1.5mcg PO q HD - No ESA (Last Hgb 14)  Assessment/Plan: 1.  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 BCx pending. VVS following, for arteriogram soon and will need amputation of some kind (unclear what level at this time, awaiting vascular studies). 2.  ESRD: HD today for missed HD yesterday, then back tomorrow for holiday HD sched (MWF = [facility]) 3.  Hypertension/volume: BP high, UF as tolerated. 4.  Anemia: Hgb > 13. No ESA for now. 5.  Metabolic bone disease: Ca ok, Phos pending here (usually high). Continue Fosrenol as binder. 6.  CAD (Hx CABG) 7.  Type 2 DM: Per primary  Veneta Penton, PA-C 08/05/2018, 11:40 AM  [facility] Pager: [phone]  Pt seen, examined and agree w A/P as above. ESRD pt w/ toe infection, has had prior amputation of the L leg for infection.  HD plans as above.  Kelly Splinter  MD [employer] pager [phone]   08/05/2018, 1:30 PM

## 2018-08-05 NOTE — H&P (Addendum)
History and Physical    [name] Dugue Sr. DVV:[id number] DOB: [birth year] DOA: 08/04/2018  PCP: Patient, [name] Per  Patient coming from: Home.  Chief Complaint: Right foot pain and gangrene of the right small toe.  HPI: [name] Servantes Sr. is a 42 y.o. male with history of CAD status post CABG, ESRD on hemodialysis Monday Wednesday Friday, diabetes mellitus type 2, peripheral vascular disease status post amputation of multiple toes, hypertension presents to the ER because of increasing pain in the right foot with discoloration of his right foot small toe.  Patient symptoms started 10 days ago which has been progressively worsening.  Pain is now involving the whole foot.  Has no decreased motion and patient has good sensation.  Denies any trauma.  Because of his foot pain he has missed his dialysis yesterday.  ED Course: In the ER on exam patient's foot is warm and there is dry gangrene of his right foot small toe.  X-rays do not show anything acute.  Patient was placed on empiric antibiotics and admitted for further management of foot pain with dry gangrene of his small toe of his right foot.  Blood pressure was elevated for which patient has been placed on PRN IV hydralazine.  Patient did miss his dialysis but not short of breath at this time.  Troponin is been elevated denies any chest pain.  Chest x-ray unremarkable EKG shows nonspecific changes with T wave changes..  Review of Systems: As per HPI, rest all negative.   Past Medical History:  Diagnosis Date  . Anemia   . Atherosclerosis of lower extremity ([place])   . CAD (coronary artery disease)   . Chronic combined systolic and diastolic heart failure ([place])   . Complication of anesthesia   . ESRD (end stage renal disease) on dialysis [facility])    "MWF [employer]" (03/08/2017)  . GERD (gastroesophageal reflux disease)   . Heart murmur   . History of blood transfusion    "related to OR"  . Hypertension   . PONV  (postoperative nausea and vomiting)   . Type II diabetes mellitus ([place])     Past Surgical History:  Procedure Laterality Date  . ABDOMINAL AORTOGRAM N/A 11/02/2016   Procedure: Abdominal Aortogram;  Surgeon: Waynetta Sandy, MD;  Location: [place] [facility];  Service: Cardiovascular;  Laterality: N/A;  . AMPUTATION Left 09/27/2013   Procedure: LEFT GREAT TOE AMPUTATION;  Surgeon: [name], MD;  Location: [place];  Service: Orthopedics;  Laterality: Left;  . AMPUTATION Right 08/15/2015   Procedure: Right Great Toe Amputation;  Surgeon: [name], MD;  Location: [place];  Service: Orthopedics;  Laterality: Right;  . AMPUTATION Left 11/05/2016   Procedure: TRANSMETATARSAL AMPUTATION LEFT FOOT;  Surgeon: [name], MD;  Location: [place];  Service: Orthopedics;  Laterality: Left;  . AMPUTATION Left 03/11/2017   Procedure: LEFT BELOW KNEE AMPUTATION;  Surgeon: [name], MD;  Location: [place];  Service: Orthopedics;  Laterality: Left;  . AMPUTATION Right 03/11/2017   Procedure: RIGHT 2ND TOE AMPUTATION;  Surgeon: [name], MD;  Location: [place];  Service: Orthopedics;  Laterality: Right;  . AV FISTULA PLACEMENT  left arm  . CORONARY ARTERY BYPASS GRAFT N/A 11/04/2017   Procedure: CORONARY ARTERY BYPASS GRAFTING (CABG) x three, using left internal mammary artery and right    leg greater saphenous vein;  Surgeon: Melrose Nakayama, MD;  Location: [place];  Service: Open Heart Surgery;  Laterality: N/A;  . LEFT HEART CATH AND CORONARY ANGIOGRAPHY N/A 10/31/2017   Procedure: LEFT HEART CATH AND CORONARY ANGIOGRAPHY;  Surgeon: Troy Sine, MD;  Location: [place] [facility];  Service: Cardiovascular;  Laterality: N/A;  . LEFT HEART CATHETERIZATION WITH CORONARY ANGIOGRAM N/A 09/13/2014   Procedure: LEFT HEART CATHETERIZATION WITH CORONARY ANGIOGRAM;  Surgeon: Sinclair Grooms, MD;  Location: [facility] CATH LAB;  Service: Cardiovascular;  Laterality: N/A;  . LOWER EXTREMITY ANGIOGRAPHY  Bilateral 11/02/2016   Procedure: Lower Extremity Angiography;  Surgeon: Waynetta Sandy, MD;  Location: [place] [facility];  Service: Cardiovascular;  Laterality: Bilateral;  . PERIPHERAL VASCULAR ATHERECTOMY Left 11/02/2016   Procedure: Peripheral Vascular Atherectomy;  Surgeon: Waynetta Sandy, MD;  Location: [place] [facility];  Service: Cardiovascular;  Laterality: Left;  PERONEAL  . STUMP REVISION Left 01/11/2017   Procedure: Revision Left Transmetatarsal Amputation;  Surgeon: [name], MD;  Location: [place];  Service: Orthopedics;  Laterality: Left;  . TEE WITHOUT CARDIOVERSION N/A 11/04/2017   Procedure: TRANSESOPHAGEAL ECHOCARDIOGRAM (TEE);  Surgeon: Melrose Nakayama, MD;  Location: [place];  Service: Open Heart Surgery;  Laterality: N/A;     reports that he has been smoking cigarettes. He has a 13.00 pack-year smoking history. He has never used smokeless tobacco. He reports current alcohol use. He reports current drug use. Drug: Marijuana.  Allergies  Allergen Reactions  . Coconut Oil Anaphylaxis    Can use topically, allergic to coconut foods     Family History  Problem Relation Age of Onset  . Heart failure Mother   . Hypertension Mother     Prior to Admission medications   Medication Sig Start Date End Date Taking? Authorizing Provider  aspirin EC 81 MG tablet Take 1 tablet (81 mg total) by mouth daily. 02/07/18  Yes Burtis Junes, NP  atorvastatin (LIPITOR) 80 MG tablet Take 1 tablet (80 mg total) by mouth daily at 6 PM. 02/07/18  Yes Burtis Junes, NP  calcitRIOL (ROCALTROL) 0.25 MCG capsule Take 5 capsules (1.25 mcg total) by mouth every Monday, Wednesday, and Friday with hemodialysis. 11/09/17  Yes Conte, Tessa N, PA-C  carvedilol (COREG) 6.25 MG tablet Take 1 tablet (6.25 mg total) by mouth 2 (two) times daily with a meal. Patient taking differently: Take 6.25 mg by mouth daily.  02/07/18  Yes Burtis Junes, NP  cholecalciferol (VITAMIN D3)  25 MCG (1000 UT) tablet Take 1,000 Units by mouth daily.   Yes [provider]  clopidogrel (PLAVIX) 75 MG tablet Take 1 tablet (75 mg total) by mouth daily. 02/07/18  Yes Burtis Junes, NP  Darbepoetin Alfa (ARANESP) 60 MCG/0.3ML SOSY injection Inject 0.3 mLs (60 mcg total) into the vein every Tuesday with hemodialysis. Patient taking differently: Inject 60 mcg into the vein See admin instructions. On Tuesday and Thursday with Dialysis 11/15/17  Yes Harriet Pho, Tessa N, PA-C  docusate sodium (COLACE) 100 MG capsule Take 100 mg by mouth daily.   Yes [provider]  glucagon (GLUCAGON EMERGENCY) 1 MG injection Inject 1 mg into the vein once as needed (low blood sugar and inability to eat or drink sugar). 08/16/15  Yes Short, Noah Delaine, MD  insulin glargine (LANTUS) 100 UNIT/ML injection Inject 0.07 mLs (7 Units total) into the skin at bedtime. Patient taking differently: Inject 6 Units into the skin at bedtime.  03/17/17  Yes Rai, Ripudeep K, MD  ipratropium-albuterol (DUONEB) 0.5-2.5 (3) MG/3ML SOLN Take 3 mLs by  nebulization 3 (three) times daily as needed (for shortness of breath).    Yes [provider]  lamoTRIgine (LAMICTAL) 150 MG tablet Take 150 mg by mouth at bedtime.   Yes [provider]  lanthanum (FOSRENOL) 1000 MG chewable tablet Chew 2 tablets (2,000 mg total) by mouth 3 (three) times daily with meals. 08/16/15  Yes Short, Noah Delaine, MD  ondansetron (ZOFRAN) 4 MG tablet Take 4 mg by mouth every 6 (six) hours as needed for nausea.    Yes [provider]  sertraline (ZOLOFT) 100 MG tablet Take 50 mg by mouth daily.   Yes [provider]  calcium acetate (PHOSLO) 667 MG capsule Take 3 capsules (2,001 mg total) by mouth 3 (three) times daily with meals. Patient not taking: Reported on 08/04/2018 03/17/17   Rai, Vernelle Emerald, MD  insulin aspart (NOVOLOG) 100 UNIT/ML injection Sliding scale CBG 70 - 120: 0 units CBG 121 - 150: 1 unit,  CBG 151 - 200:  2 units,  CBG 201 - 250: 3 units,  CBG 251 - 300: 5 units,  CBG 301 - 350: 7 units,  CBG 351 - 400: 9 units   CBG > 400: 9 units and notify your MD Patient not taking: Reported on 08/04/2018 03/17/17   Mendel Corning, MD    Physical Exam: Vitals:   08/05/18 0005 08/05/18 0103 08/05/18 0129 08/05/18 0216  BP:  (!) 206/78 (!) 189/41 (!) 182/86  Pulse:  83 80 85  Resp:  18 18   Temp:  99.4 F (37.4 C)    TempSrc:  Oral    SpO2:  98%  99%  Weight: 84.9 kg 85.2 kg    Height: 6\' 2"  (1.88 m)         Constitutional: Moderately built and nourished. Vitals:   08/05/18 0005 08/05/18 0103 08/05/18 0129 08/05/18 0216  BP:  (!) 206/78 (!) 189/41 (!) 182/86  Pulse:  83 80 85  Resp:  18 18   Temp:  99.4 F (37.4 C)    TempSrc:  Oral    SpO2:  98%  99%  Weight: 84.9 kg 85.2 kg    Height: 6\' 2"  (1.88 m)      Eyes: Anicteric no pallor. ENMT: No discharge from the ears eyes nose or mouth. Neck: No mass felt.  No neck rigidity. Respiratory: No rhonchi or crepitations. Cardiovascular: S1-S2 heard. Abdomen: Soft nontender bowel sounds present. Musculoskeletal: No edema.  Foot is warm to touch.  Left BKA.  Pulse was dopplerable on the right foot on the dorsalis pedis. Skin: Foot is warm to touch with dry gangrene of the right foot small toe. Neurologic: Alert awake oriented to time place and person.  Moves all extremities. Psychiatric: Appears normal per normal affect.   Labs on Admission: I have personally reviewed following labs and imaging studies  CBC: Recent Labs  Lab 08/04/18 2035  WBC 8.8  NEUTROABS 6.1  HGB 13.4  HCT 42.6  MCV 86.4  PLT 706*   Basic Metabolic Panel: Recent Labs  Lab 08/04/18 2035  NA 134*  K 4.2  CL 94*  CO2 19*  GLUCOSE 308*  BUN 54*  CREATININE 12.31*  CALCIUM 8.7*   GFR: Estimated Creatinine Clearance: 9.2 mL/min (A) (by C-G formula based on SCr of 12.31 mg/dL (H)). Liver Function Tests: Recent Labs  Lab 08/04/18 2035  AST 13*  ALT 12    ALKPHOS 54  BILITOT 0.7  PROT 8.0  ALBUMIN 2.8*   No  results for input(s): LIPASE, AMYLASE in the last 168 hours. No results for input(s): AMMONIA in the last 168 hours. Coagulation Profile: No results for input(s): INR, PROTIME in the last 168 hours. Cardiac Enzymes: Recent Labs  Lab 08/04/18 2035  TROPONINI 0.03*   BNP (last 3 results) No results for input(s): PROBNP in the last 8760 hours. HbA1C: No results for input(s): HGBA1C in the last 72 hours. CBG: No results for input(s): GLUCAP in the last 168 hours. Lipid Profile: No results for input(s): CHOL, HDL, LDLCALC, TRIG, CHOLHDL, LDLDIRECT in the last 72 hours. Thyroid Function Tests: No results for input(s): TSH, T4TOTAL, FREET4, T3FREE, THYROIDAB in the last 72 hours. Anemia Panel: No results for input(s): VITAMINB12, FOLATE, FERRITIN, TIBC, IRON, RETICCTPCT in the last 72 hours. Urine analysis:    Component Value Date/Time   COLORURINE RED BIOCHEMICALS MAY BE AFFECTED BY COLOR (A) 01/29/2010 0939   APPEARANCEUR CLOUDY (A) 01/29/2010 0939   LABSPEC 1.020 01/29/2010 0939   PHURINE 8.0 01/29/2010 0939   GLUCOSEU NEGATIVE 01/29/2010 0939   HGBUR LARGE (A) 01/29/2010 0939   BILIRUBINUR MODERATE (A) 01/29/2010 0939   KETONESUR 15 (A) 01/29/2010 0939   PROTEINUR >300 (A) 01/29/2010 0939   UROBILINOGEN 0.2 01/29/2010 0939   NITRITE POSITIVE (A) 01/29/2010 0939   LEUKOCYTESUR LARGE (A) 01/29/2010 0939   Sepsis Labs: @LABRCNTIP (procalcitonin:4,lacticidven:4) )No results found for this or any previous visit (from the past 240 hour(s)).   Radiological Exams on Admission: Dg Chest 1 View  Result Date: 08/04/2018 CLINICAL DATA:  Recent right toe pain. Patient on dialysis. EXAM: CHEST  1 VIEW COMPARISON:  Chest radiograph performed 07/12/2018 FINDINGS: The lungs are well-aerated. Minimal bibasilar atelectasis is noted. There is no evidence of pleural effusion or pneumothorax. The cardiomediastinal silhouette is  borderline normal in size. The patient is status post median sternotomy, with evidence of prior CABG. No acute osseous abnormalities are seen. IMPRESSION: Minimal bibasilar atelectasis noted; lungs otherwise clear. Electronically Signed   By: [name] M.D.   On: 08/04/2018 21:28   Dg Foot Complete Right  Result Date: 08/04/2018 CLINICAL DATA:  Subacute onset of right fifth toe pain. History of toe amputation. Patient on dialysis. EXAM: RIGHT FOOT COMPLETE - 3+ VIEW COMPARISON:  Right foot radiographs performed 03/07/2017 FINDINGS: There is no evidence of fracture or dislocation. The joint spaces are preserved. No definite osseous erosions are seen to suggest osteomyelitis, though evaluation for osteomyelitis is limited on radiograph. There is no evidence of talar subluxation; the subtalar joint is unremarkable in appearance. Diffuse vascular calcifications are seen. The patient is status post amputation at the first and second toes. IMPRESSION: 1. No evidence of fracture or dislocation. 2. No definite osseous erosions seen to suggest osteomyelitis, though evaluation for osteomyelitis is limited on radiograph. MRI could be considered for further evaluation to assess for osteomyelitis, as deemed clinically appropriate. 3. Diffuse vascular calcifications seen. Electronically Signed   By: [name] M.D.   On: 08/04/2018 21:30    EKG: Independently reviewed.  Normal sinus rhythm with diffuse T wave changes.  Assessment/Plan Principal Problem:   Gangrene of toe of left foot (H[facility]) Active Problems:   ESRD on dialysis [facility])   Essential hypertension   Chronic combined systolic and diastolic CHF (congestive heart failure) ([facility])   PVD (peripheral vascular disease) ([facility])   S/P CABG x 3   ESRD (end stage renal disease) on dialysis ([place])   DM (diabetes mellitus), type 2 with renal complications (H[facility])   Gangrene  of foot ([place])    1. Right foot pain with dry gangrene of the small toe of the right  foot -will get MRI and consult vascular surgery.  Keep patient n.p.o. after midnight and on pain relief medication empiric antibiotics.  Patient is on aspirin Plavix and statins for known history of peripheral vascular disease requiring amputation previously.  Addendum -patient's dorsalis pedis pulse on the right foot was dopplerable. 2. Diabetes mellitus type 2 uncontrolled on Lantus insulin 6 units with sliding scale coverage.  Closely follow CBGs. 3. Hypertension uncontrolled in addition to home medications Coreg patient has been placed on hydralazine PRN IV. 4. CAD status post CABG troponin mildly elevated denies any chest pain.  On aspirin Coreg Plavix statins. 5. ESRD on hemodialysis on Monday Wednesday Friday missed his dialysis yesterday due to foot pain.  Consult nephrology in the morning for dialysis.  Presently does not look fluid overloaded.   DVT prophylaxis: SCDs in anticipation of procedure. Code Status: Full code. Family Communication: Discussed with patient. Disposition Plan: Home. Consults called: We will consult vascular surgery. Admission status: Observation.   Rise Patience MD [facility] Pager [phone].  If 7PM-7AM, please contact night-coverage [email or web address] Password [facility]  08/05/2018, 3:28 AM

## 2018-08-05 NOTE — Consult Note (Addendum)
[facility] [place] NOTE   MRN : [id number]  Reason for Consult: right LE pain Referring Physician: Rise Patience MD  History of Present Illness: 42 y/o male presents to the ER because of increasing pain in the right foot with discoloration of his right foot small toe.   He states his pain started 10 days ago as well as the toe dry gangrene.  He had his right GT and second amputated by [name] for the same reason in the past 03/11/2017.  The amputation sites have healed well on the right LE.   Past medical history: CAD status post CABG, ESRD on hemodialysis Monday Wednesday Friday, diabetes mellitus type 2, peripheral vascular disease status post well healed amputation left BKA by [name] 03/11/2018, and hypertension.  He has been seen in our office by Dr. Donzetta Matters in the past for PAD prior to the amputation secondary to left foot gangrene.  Angiogram was performed On the patient's right side there was 3 vessels of the trifurcation. Dominant flow via the PT and peroneal artery but was incompletely visualized due to timing of the contrast. 3/20 2018.  He was lost to f/u.      Current Facility-Administered Medications  Medication Dose Route Frequency Provider Last Rate Last Dose  . acetaminophen (TYLENOL) tablet 650 mg  650 mg Oral Q6H PRN Rise Patience, MD       Or  . acetaminophen (TYLENOL) suppository 650 mg  650 mg Rectal Q6H PRN Rise Patience, MD      . aspirin EC tablet 81 mg  81 mg Oral Daily Rise Patience, MD      . atorvastatin (LIPITOR) tablet 80 mg  80 mg Oral q1800 Rise Patience, MD      . Derrill Memo ON 08/07/2018] calcitRIOL (ROCALTROL) capsule 1.25 mcg  1.25 mcg Oral Q M,W,F-HD Rise Patience, MD      . carvedilol (COREG) tablet 6.25 mg  6.25 mg Oral Daily Rise Patience, MD      . clopidogrel (PLAVIX) tablet 75 mg  75 mg Oral Daily Rise Patience, MD      . hydrALAZINE (APRESOLINE) injection 5 mg  5 mg  Intravenous Q4H PRN Rise Patience, MD   5 mg at 08/05/18 0137  . insulin aspart (novoLOG) injection 0-9 Units  0-9 Units Subcutaneous Q4H Rise Patience, MD   3 Units at 08/05/18 0359  . insulin glargine (LANTUS) injection 6 Units  6 Units Subcutaneous QHS Rise Patience, MD   6 Units at 08/05/18 0357  . lamoTRIgine (LAMICTAL) tablet 150 mg  150 mg Oral QHS Rise Patience, MD      . lanthanum [facility]) chewable tablet 2,000 mg  2,000 mg Oral TID WC Rise Patience, MD      . ondansetron [facility]) tablet 4 mg  4 mg Oral Q6H PRN Rise Patience, MD       Or  . ondansetron [facility]) injection 4 mg  4 mg Intravenous Q6H PRN Rise Patience, MD      . piperacillin-tazobactam (ZOSYN) IVPB 3.375 g  3.375 g Intravenous Q12H Erenest Blank, RPH      . sertraline (ZOLOFT) tablet 50 mg  50 mg Oral Daily Rise Patience, MD      . Derrill Memo ON 08/07/2018] vancomycin (VANCOCIN) IVPB 1000 mg/200 mL premix  1,000 mg Intravenous Q M,W,F-HD Erenest Blank, [facility]  Pt meds include: Statin :Yes Betablocker: Yes ASA: Yes Other anticoagulants/antiplatelets: Plavix  Past Medical History:  Diagnosis Date  . Anemia   . Atherosclerosis of lower extremity ([place])   . CAD (coronary artery disease)   . Chronic combined systolic and diastolic heart failure ([place])   . Complication of anesthesia   . ESRD (end stage renal disease) on dialysis [facility])    "MWF [employer]" (03/08/2017)  . GERD (gastroesophageal reflux disease)   . Heart murmur   . History of blood transfusion    "related to OR"  . Hypertension   . PONV (postoperative nausea and vomiting)   . Type II diabetes mellitus ([place])     Past Surgical History:  Procedure Laterality Date  . ABDOMINAL AORTOGRAM N/A 11/02/2016   Procedure: Abdominal Aortogram;  Surgeon: Waynetta Sandy, MD;  Location: [place] [facility];  Service: Cardiovascular;  Laterality: N/A;  . AMPUTATION Left 09/27/2013   Procedure:  LEFT GREAT TOE AMPUTATION;  Surgeon: [name], MD;  Location: [place];  Service: Orthopedics;  Laterality: Left;  . AMPUTATION Right 08/15/2015   Procedure: Right Great Toe Amputation;  Surgeon: [name], MD;  Location: [place];  Service: Orthopedics;  Laterality: Right;  . AMPUTATION Left 11/05/2016   Procedure: TRANSMETATARSAL AMPUTATION LEFT FOOT;  Surgeon: [name], MD;  Location: [place];  Service: Orthopedics;  Laterality: Left;  . AMPUTATION Left 03/11/2017   Procedure: LEFT BELOW KNEE AMPUTATION;  Surgeon: [name], MD;  Location: [place];  Service: Orthopedics;  Laterality: Left;  . AMPUTATION Right 03/11/2017   Procedure: RIGHT 2ND TOE AMPUTATION;  Surgeon: [name], MD;  Location: [place];  Service: Orthopedics;  Laterality: Right;  . AV FISTULA PLACEMENT  left arm  . CORONARY ARTERY BYPASS GRAFT N/A 11/04/2017   Procedure: CORONARY ARTERY BYPASS GRAFTING (CABG) x three, using left internal mammary artery and right    leg greater saphenous vein;  Surgeon: Melrose Nakayama, MD;  Location: [place];  Service: Open Heart Surgery;  Laterality: N/A;  . LEFT HEART CATH AND CORONARY ANGIOGRAPHY N/A 10/31/2017   Procedure: LEFT HEART CATH AND CORONARY ANGIOGRAPHY;  Surgeon: Troy Sine, MD;  Location: [place] [facility];  Service: Cardiovascular;  Laterality: N/A;  . LEFT HEART CATHETERIZATION WITH CORONARY ANGIOGRAM N/A 09/13/2014   Procedure: LEFT HEART CATHETERIZATION WITH CORONARY ANGIOGRAM;  Surgeon: Sinclair Grooms, MD;  Location: [facility] CATH LAB;  Service: Cardiovascular;  Laterality: N/A;  . LOWER EXTREMITY ANGIOGRAPHY Bilateral 11/02/2016   Procedure: Lower Extremity Angiography;  Surgeon: Waynetta Sandy, MD;  Location: [place] CV [facility];  Service: Cardiovascular;  Laterality: Bilateral;  . PERIPHERAL VASCULAR ATHERECTOMY Left 11/02/2016   Procedure: Peripheral Vascular Atherectomy;  Surgeon: Waynetta Sandy, MD;  Location: [place] [facility];  Service:  Cardiovascular;  Laterality: Left;  PERONEAL  . STUMP REVISION Left 01/11/2017   Procedure: Revision Left Transmetatarsal Amputation;  Surgeon: [name], MD;  Location: [place];  Service: Orthopedics;  Laterality: Left;  . TEE WITHOUT CARDIOVERSION N/A 11/04/2017   Procedure: TRANSESOPHAGEAL ECHOCARDIOGRAM (TEE);  Surgeon: Melrose Nakayama, MD;  Location: [place];  Service: Open Heart Surgery;  Laterality: N/A;    Social History Social History   Tobacco Use  . Smoking status: Current Every Day Smoker    Packs/day: 0.50    Years: 26.00    Pack years: 13.00    Types: Cigarettes  . Smokeless tobacco: Never Used  Substance Use Topics  . Alcohol use: Yes    Comment: 03/08/2017 "haven't took a drink in over 5 years"  . Drug use: Yes    Types: Marijuana    Comment: 03/08/2017 "basically q day"     Family History Family History  Problem Relation Age of Onset  . Heart failure Mother   . Hypertension Mother     Allergies  Allergen Reactions  . Coconut Oil Anaphylaxis    Can use topically, allergic to coconut foods      REVIEW OF SYSTEMS  General: [ ]  Weight loss, [ ]  Fever, [ ]  chills Neurologic: [ ]  Dizziness, [ ]  Blackouts, [ ]  Seizure [ ]  Stroke, [ ]  "Mini stroke", [ ]  Slurred speech, [ ]  Temporary blindness; [ ]  weakness in arms or legs, [ ]  Hoarseness [ ]  Dysphagia Cardiac: [ ]  Chest pain/pressure, [ ]  Shortness of breath at rest [ ]  Shortness of breath with exertion, [ ]  Atrial fibrillation or irregular heartbeat  Vascular: [ ]  Pain in legs with walking, [ ]  Pain in legs at rest, [ ]  Pain in legs at night,  [ ]  Non-healing ulcer, [ ]  Blood clot in vein/DVT,   Pulmonary: [ ]  Home oxygen, [ ]  Productive cough, [ ]  Coughing up blood, [ ]  Asthma,  [ ]  Wheezing [ ]  COPD Musculoskeletal:  [ ]  Arthritis, [ ]  Low back pain, [ ]  Joint pain Hematologic: [ ]  Easy Bruising, [ ]  Anemia; [ ]  Hepatitis Gastrointestinal: [ ]  Blood in stool, [ ]  Gastroesophageal  Reflux/heartburn, Urinary: [ ]  chronic Kidney disease, [ ]  on HD - [ ]  MWF or [ ]  TTHS, [ ]  Burning with urination, [ ]  Difficulty urinating Skin: [ ]  Rashes, [ ]  Wounds Psychological: [ ]  Anxiety, [ ]  Depression  Physical Examination Vitals:   08/05/18 0103 08/05/18 0129 08/05/18 0216 08/05/18 0521  BP: (!) 206/78 (!) 189/41 (!) 182/86 (!) 177/77  Pulse: 83 80 85 91  Resp: 18 18  18   Temp: 99.4 F (37.4 C)   98.8 F (37.1 C)  TempSrc: Oral   Oral  SpO2: 98%  99% 100%  Weight: 85.2 kg     Height:       Body mass index is 24.12 kg/m.  General:  WDWN in NAD Gait: left BKA prothesis HENT: WNL, normocephalic Eyes: Pupils equal Pulmonary: normal non-labored breathing , without Rales, rhonchi,  wheezing Cardiac: RRR, without  Murmurs, rubs or gallops; No carotid bruits Abdomen: soft, NT, no masses Skin: no rashes, ulcers noted;  Positive 5th toe dry Gangrene , no cellulitis; no open wounds;   Vascular Exam/Pulses:radial, femoral palpable pulses with right DP/PT/peroneal doppler signals   Musculoskeletal: no muscle wasting or atrophy; no edema  Neurologic: A&O X 3; Appropriate Affect ;  SENSATION: normal; MOTOR FUNCTION: 5/5 Symmetric Speech is fluent/normal   Significant Diagnostic Studies: CBC Lab Results  Component Value Date   WBC 10.8 (H) 08/05/2018   HGB 11.7 (L) 08/05/2018   HCT 37.6 (L) 08/05/2018   MCV 86.0 08/05/2018   PLT 461 (H) 08/05/2018    BMET    Component Value Date/Time   NA 133 (L) 08/05/2018 0416   NA 133 (L) 02/07/2018 0937   K 4.3 08/05/2018 0416   CL 95 (L) 08/05/2018 0416   CO2 20 (L) 08/05/2018 0416   GLUCOSE 227 (H) 08/05/2018 0416   GLUCOSE 262 03/06/2008   BUN 59 (H) 08/05/2018 0416   BUN 37 (H) 02/07/2018 5361  CREATININE 12.85 (H) 08/05/2018 0416   CALCIUM 8.5 (L) 08/05/2018 0416   GFRNONAA 4 (L) 08/05/2018 0416   GFRAA 5 (L) 08/05/2018 0416   Estimated Creatinine Clearance: 8.8 mL/min (A) (by C-G formula based on SCr of  12.85 mg/dL (H)).  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.  If the ABI is abnormal he will need repeat right LE angiography to determine if the 5th toe amputation site will heal.   Once the work up is complete [name] can be consulted.   Roxy Horseman 08/05/2018 8:11 AM   I have seen and evaluated the patient. I agree with the PA note as documented above. Right 5th toe dry gangrene with pain.  DP/PT/peroneal signals.  Will plan for ABI.  Reviewed arteriogram from 2018 and patient iliac, common femoral, SFA, pop, and three vessel runoff.  Probably has calcified tibial disease in setting of ESRD/DM.  Maybe arteriogram this week.  [name], MD [facility] [place] Office: [phone] Pager: [phone]

## 2018-08-05 NOTE — Progress Notes (Signed)
  PROGRESS NOTE  Patient admitted earlier this morning. See H&P. Icholas Irby Sr. is a 42 y.o. male with history of CAD status post CABG, ESRD on hemodialysis Monday Wednesday Friday, diabetes mellitus type 2, peripheral vascular disease status post amputation of multiple toes, hypertension presents to the ER because of increasing pain in the right foot with discoloration of his right foot small toe.  Patient symptoms started 10 days ago which has been progressively worsening.     MRI right foot pending Vascular surgery consulted, planning for ABI Nephrology consulted for dialysis Pain control Continue vancomycin, Zosyn Continue aspirin, Plavix   Dessa Phi, DO [facility] [email or web address] Password [email or web address] 08/05/2018, 1:49 PM

## 2018-08-05 NOTE — Progress Notes (Signed)
Pharmacy Antibiotic Note  [name]. is a 42 y.o. male admitted on 08/04/2018 with cellulitis/wound infection.  Pharmacy has been consulted for Vancomycin/Zosyn dosing. WBC WNL. ESRD on HD MWF.   Plan: Vancomycin 2000 mg IV x 1, then 1000 mg IV qHD MWF Zosyn 3.375G IV q12h to be infused over 4 hours Trend WBC, temp, HD schedule F/U infectious work-up Drug levels as indicated   Height: 6\' 2"  (188 cm) Weight: 187 lb 13.3 oz (85.2 kg) IBW/kg (Calculated) : 82.2  Temp (24hrs), Avg:98.8 F (37.1 C), Min:98.2 F (36.8 C), Max:99.4 F (37.4 C)  Recent Labs  Lab 08/04/18 2035  WBC 8.8  CREATININE 12.31*  LATICACIDVEN 1.8    Estimated Creatinine Clearance: 9.2 mL/min (A) (by C-G formula based on SCr of 12.31 mg/dL (H)).    Allergies  Allergen Reactions  . Coconut Oil Anaphylaxis    Can use topically, allergic to coconut foods      Narda Bonds 08/05/2018 3:45 AM

## 2018-08-05 NOTE — Progress Notes (Signed)
Pt. Arrived on unit via bed. Alert and oriented. Placed on telemetry monitoring. Will continue to monitor.

## 2018-08-05 NOTE — Progress Notes (Signed)
VASCULAR LAB PRELIMINARY  PRELIMINARY  PRELIMINARY  PRELIMINARY  ABIs completed.    Preliminary report:  Right ABI not ascertained secondary to non compressible vessels.  Right TBI not performed secondary to toe amputations.  Left BKA.  Koraima Albertsen, RVT 08/05/2018, 9:44 AM

## 2018-08-05 NOTE — Procedures (Signed)
   I was present at this dialysis session, have reviewed the session itself and made  appropriate changes Kelly Splinter MD [place] pager [phone]   08/05/2018, 1:31 PM

## 2018-08-06 ENCOUNTER — Other Ambulatory Visit: Payer: Self-pay

## 2018-08-06 LAB — BASIC METABOLIC PANEL
Anion gap: 16 — ABNORMAL HIGH (ref 5–15)
BUN: 31 mg/dL — ABNORMAL HIGH (ref 6–20)
CO2: 24 mmol/L (ref 22–32)
CREATININE: 8.49 mg/dL — AB (ref 0.61–1.24)
Calcium: 8 mg/dL — ABNORMAL LOW (ref 8.9–10.3)
Chloride: 91 mmol/L — ABNORMAL LOW (ref 98–111)
GFR calc Af Amer: 8 mL/min — ABNORMAL LOW (ref >60)
GFR calc non Af Amer: 7 mL/min — ABNORMAL LOW (ref >60)
Glucose, Bld: 217 mg/dL — ABNORMAL HIGH (ref 70–99)
Potassium: 3.8 mmol/L (ref 3.5–5.1)
SODIUM: 131 mmol/L — AB (ref 135–145)

## 2018-08-06 LAB — CBC
HCT: 38.8 % — ABNORMAL LOW (ref 39.0–52.0)
Hemoglobin: 12.3 g/dL — ABNORMAL LOW (ref 13.0–17.0)
MCH: 27 pg (ref 26.0–34.0)
MCHC: 31.7 g/dL (ref 30.0–36.0)
MCV: 85.1 fL (ref 80.0–100.0)
Platelets: 460 10*3/uL — ABNORMAL HIGH (ref 150–400)
RBC: 4.56 MIL/uL (ref 4.22–5.81)
RDW: 14.6 % (ref 11.5–15.5)
WBC: 9.6 10*3/uL (ref 4.0–10.5)
nRBC: 0 % (ref 0.0–0.2)

## 2018-08-06 LAB — GLUCOSE, CAPILLARY
GLUCOSE-CAPILLARY: 228 mg/dL — AB (ref 70–99)
Glucose-Capillary: 134 mg/dL — ABNORMAL HIGH (ref 70–99)
Glucose-Capillary: 172 mg/dL — ABNORMAL HIGH (ref 70–99)
Glucose-Capillary: 192 mg/dL — ABNORMAL HIGH (ref 70–99)

## 2018-08-06 MED ORDER — CALCITRIOL 0.5 MCG PO CAPS
ORAL_CAPSULE | ORAL | Status: AC
  Filled 2018-08-06: qty 2

## 2018-08-06 MED ORDER — CALCITRIOL 0.25 MCG PO CAPS
ORAL_CAPSULE | ORAL | Status: AC
  Filled 2018-08-06: qty 1

## 2018-08-06 MED ORDER — HYDROMORPHONE HCL 1 MG/ML IJ SOLN
INTRAMUSCULAR | Status: AC
  Administered 2018-08-06: 1 mg via INTRAVENOUS
  Filled 2018-08-06: qty 1

## 2018-08-06 MED ORDER — HEPARIN SODIUM (PORCINE) 5000 UNIT/ML IJ SOLN
5000.0000 [IU] | Freq: Three times a day (TID) | INTRAMUSCULAR | Status: DC
  Administered 2018-08-06 – 2018-08-12 (×16): 5000 [IU] via SUBCUTANEOUS
  Filled 2018-08-06 (×16): qty 1

## 2018-08-06 MED ORDER — VANCOMYCIN HCL IN DEXTROSE 1-5 GM/200ML-% IV SOLN
INTRAVENOUS | Status: AC
  Administered 2018-08-06: 1000 mg via INTRAVENOUS
  Filled 2018-08-06: qty 200

## 2018-08-06 NOTE — Progress Notes (Addendum)
[facility] Progress Note   Subjective:  Seen on HD, 2L UF goal and tolerating without issues. Denies CP/dyspnea. Says R foot hurts. MRI foot completed, read pending.  Objective Vitals:   08/05/18 1628 08/05/18 2131 08/06/18 0500 08/06/18 0507  BP: (!) 145/38 (!) 158/43  (!) 158/58  Pulse: 78 84  87  Resp: 18 18  18   Temp: 98.4 F (36.9 C) 98.6 F (37 C)  98.6 F (37 C)  TempSrc: Oral Oral    SpO2: 100% 98%  97%  Weight:  82.2 kg 82.2 kg   Height:       Physical Exam General: Well appearing man, NAD Heart: RRR; no murmur Lungs: CTAB Extremities: L BKA without stump edema. RLE without edema, s/p prior amputation 1st/2nd toes. 5th toe dry necrotic/gangrene appearance. Dialysis Access: L AVF + thrill (cannulated)  Additional Objective Labs: Basic Metabolic Panel: Recent Labs  Lab 08/04/18 2035 08/05/18 0416 08/06/18 0340  NA 134* 133* 131*  K 4.2 4.3 3.8  CL 94* 95* 91*  CO2 19* 20* 24  GLUCOSE 308* 227* 217*  BUN 54* 59* 31*  CREATININE 12.31* 12.85* 8.49*  CALCIUM 8.7* 8.5* 8.0*   Liver Function Tests: Recent Labs  Lab 08/04/18 2035  AST 13*  ALT 12  ALKPHOS 54  BILITOT 0.7  PROT 8.0  ALBUMIN 2.8*   CBC: Recent Labs  Lab 08/04/18 2035 08/05/18 0416 08/06/18 0340  WBC 8.8 10.8* 9.6  NEUTROABS 6.1  --   --   HGB 13.4 11.7* 12.3*  HCT 42.6 37.6* 38.8*  MCV 86.4 86.0 85.1  PLT 423* 461* 460*   Blood Culture    Component Value Date/Time   SDES BLOOD RIGHT HAND 08/04/2018 2130   SPECREQUEST  08/04/2018 2130    BOTTLES DRAWN AEROBIC AND ANAEROBIC Blood Culture adequate volume   CULT  08/04/2018 2130    NO GROWTH < 12 HOURS Performed at [facility], [place] [address], [place], [place] [address]    REPTSTATUS PENDING 08/04/2018 2130   Cardiac Enzymes: Recent Labs  Lab 08/04/18 2035  TROPONINI 0.03*   CBG: Recent Labs  Lab 08/05/18 0335 08/05/18 0804 08/05/18 1626 08/05/18 2131 08/06/18 0722  GLUCAP 207* 114* 120*  297* 134*   Studies/Results: Dg Chest 1 View  Result Date: 08/04/2018 CLINICAL DATA:  Recent right toe pain. Patient on dialysis. EXAM: CHEST  1 VIEW COMPARISON:  Chest radiograph performed 07/12/2018 FINDINGS: The lungs are well-aerated. Minimal bibasilar atelectasis is noted. There is no evidence of pleural effusion or pneumothorax. The cardiomediastinal silhouette is borderline normal in size. The patient is status post median sternotomy, with evidence of prior CABG. No acute osseous abnormalities are seen. IMPRESSION: Minimal bibasilar atelectasis noted; lungs otherwise clear. Electronically Signed   By: [name] M.D.   On: 08/04/2018 21:28   Dg Foot Complete Right  Result Date: 08/04/2018 CLINICAL DATA:  Subacute onset of right fifth toe pain. History of toe amputation. Patient on dialysis. EXAM: RIGHT FOOT COMPLETE - 3+ VIEW COMPARISON:  Right foot radiographs performed 03/07/2017 FINDINGS: There is no evidence of fracture or dislocation. The joint spaces are preserved. No definite osseous erosions are seen to suggest osteomyelitis, though evaluation for osteomyelitis is limited on radiograph. There is no evidence of talar subluxation; the subtalar joint is unremarkable in appearance. Diffuse vascular calcifications are seen. The patient is status post amputation at the first and second toes. IMPRESSION: 1. No evidence of fracture or dislocation. 2. No definite  osseous erosions seen to suggest osteomyelitis, though evaluation for osteomyelitis is limited on radiograph. MRI could be considered for further evaluation to assess for osteomyelitis, as deemed clinically appropriate. 3. Diffuse vascular calcifications seen. Electronically Signed   By: [name] M.D.   On: 08/04/2018 21:30   Vas [place] Burnard Bunting With/wo Tbi  Result Date: 08/05/2018 LOWER EXTREMITY DOPPLER STUDY Indications: Gangrene. High Risk Factors: Hypertension, Diabetes, current smoker. Other Factors: ESRD on dialysis, left  access.  Vascular Interventions: Multiple toe amputations on the right. 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 Comparison Study: Prior study from 11/01/17 is available for comparison Performing Technologist: Sharion Dove RVS  Examination Guidelines: A complete evaluation includes at minimum, Doppler waveform signals and systolic blood pressure reading at the level of bilateral brachial, anterior tibial, and posterior tibial arteries, when vessel segments are accessible. Bilateral testing is considered an integral part of a complete examination. Photoelectric Plethysmograph (PPG) waveforms and toe systolic pressure readings are included as required and additional duplex testing as needed. Limited examinations for reoccurring indications may be performed as noted.  ABI Findings: +---------+------------------+-----+----------+-------------------+ Right    Rt Pressure (mmHg)IndexWaveform  Comment             +---------+------------------+-----+----------+-------------------+ Brachial 255                    biphasic  Blood pressure >255 +---------+------------------+-----+----------+-------------------+ PTA      254               1.00 monophasicNon comp            +---------+------------------+-----+----------+-------------------+ DP       213               0.84 monophasic                    +---------+------------------+-----+----------+-------------------+ Great Toe                                 amputation          +---------+------------------+-----+----------+-------------------+ +--------+------------------+-----+--------+-------+ Left    Lt Pressure (mmHg)IndexWaveformComment +--------+------------------+-----+--------+-------+ Brachial                               access  +--------+------------------+-----+--------+-------+ PTA                                    BKA     +--------+------------------+-----+--------+-------+ DP                                     BKA      +--------+------------------+-----+--------+-------+ +-------+-----------+-----------+------------+------------+ ABI/TBIToday's ABIToday's TBIPrevious ABIPrevious TBI +-------+-----------+-----------+------------+------------+ Right  non comp   Toe amp    non comp    toe amp      +-------+-----------+-----------+------------+------------+ Left   BKA                                            +-------+-----------+-----------+------------+------------+ Right ABIs appear essentially unchanged.  Summary: Right: Resting right ankle-brachial index indicates noncompressible right lower extremity arteries.  *See table(s) above for measurements and observations.  Electronically signed by Monica Martinez MD  on 08/05/2018 at 11:01:37 AM.   Final    Medications: . sodium chloride 250 mL (08/05/18 2244)  . piperacillin-tazobactam (ZOSYN)  IV 3.375 g (08/05/18 2246)  . [START ON 08/07/2018] vancomycin     . aspirin EC  81 mg Oral Daily  . atorvastatin  80 mg Oral q1800  . [START ON 08/07/2018] calcitRIOL  1.25 mcg Oral Q M,W,F-HD  . carvedilol  6.25 mg Oral Daily  . Chlorhexidine Gluconate Cloth  6 each Topical Q0600  . clopidogrel  75 mg Oral Daily  . insulin aspart  0-9 Units Subcutaneous TID WC  . insulin glargine  6 Units Subcutaneous QHS  . lamoTRIgine  150 mg Oral QHS  . lanthanum  2,000 mg Oral TID WC  . sertraline  50 mg Oral Daily    Dialysis Orders: MWF at [facility] 4hr, 200dialyzer, 500/800, EDW 80kg, 2K/2Ca, UFP #2, AVF, Heparin 2400 bolus - Calcitriol 1.28mcg PO q HD - No ESA (Last Hgb 14)  Assessment/Plan: 1.  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 BCx pending. VVS following, for arteriogram 12/24 and will need amputation of some kind (unclear what level at this time, awaiting vascular studies). 2.  ESRD: S/p yesterday d/t missed prior. Following HD holiday schedule (usual MWF = SuTuF). 3.  Hypertension/volume: BP improving, UF as tolerated. 4.  Anemia: Hgb > 13. No ESA for  now. 5.  Metabolic bone disease: Ca ok, Phos pending here (usually high). Continue Fosrenol as binder. 6.  CAD (Hx CABG) 7.  Type 2 DM: Per primary   Veneta Penton, PA-C 08/06/2018, 8:38 AM  [facility] Pager: [phone]  Pt seen, examined and agree w A/P as above.  Kelly Splinter MD [employer] pager [phone]   08/06/2018, 11:19 AM

## 2018-08-06 NOTE — Progress Notes (Signed)
Will make [name] NPO after midnight in case we have availability to do his right leg arteriogram tomorrow.  [name], MD [facility] [place] Office: [phone] Pager: [place]

## 2018-08-06 NOTE — Progress Notes (Signed)
PROGRESS NOTE    [name] Ruelas Sr.  VHQ:[id number] DOB: April 03, 1976 DOA: 08/04/2018 PCP: Patient, [name] Per     Brief Narrative:  [name] Francois a 42 y.o.malewithhistory of CAD status post CABG, ESRD on hemodialysis Monday Wednesday Friday, diabetes mellitus type 2, peripheral vascular disease status post amputation of multiple toes, hypertension presents to the ER because of increasing pain in the right foot with discoloration of his right foot small toe. Patient symptoms started 10 days ago which has been progressively worsening. MRI positive for osteomyelitis of 4th and 5th digits.   New events last 24 hours / Subjective: Seen in HD. No new complaints, continues to have pain of toes.   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 to consult this week Pain control Continue vanco/zosyn   PAD Vascular surgery planning arteriogram this week  Continue aspirin, plavix  HLD Continue lipitor   HTN Continue coreg   ESRD  Nephrology following  DM Lantus, SSI  Mood disorder Continue lamictal, zoloft    DVT prophylaxis: subq hep Code Status: Full Family Communication: No family at bedside Disposition Plan: Pending surgical plan   Consultants:   Vascular surgery  Nephrology  Procedures:   None   Antimicrobials:  Anti-infectives (From admission, onward)   Start     Dose/Rate Route  Frequency Ordered Stop   08/07/18 1200  vancomycin (VANCOCIN) IVPB 1000 mg/200 mL premix     1,000 mg 200 mL/hr over 60 Minutes Intravenous Every M-W-F (Hemodialysis) 08/05/18 0349     08/05/18 1000  piperacillin-tazobactam (ZOSYN) IVPB 3.375 g     3.375 g 12.5 mL/hr over 240 Minutes Intravenous Every 12 hours 08/05/18 0343     08/05/18 0345  vancomycin (VANCOCIN) IVPB 1000 mg/200 mL premix     1,000 mg 200 mL/hr over 60 Minutes Intravenous  Once 08/05/18 0340 08/05/18 0553   08/04/18 2030  piperacillin-tazobactam (ZOSYN) IVPB 3.375 g     3.375 g 100 mL/hr over 30 Minutes Intravenous  Once 08/04/18 2024 08/04/18 2247   08/04/18 2030  vancomycin (VANCOCIN) IVPB 1000 mg/200 mL premix     1,000 mg 200 mL/hr over 60 Minutes Intravenous  Once 08/04/18 2028 08/04/18 2353       Objective: Vitals:   08/05/18 1628 08/05/18 2131 08/06/18 0500 08/06/18 0507  BP: (!) 145/38 (!) 158/43  (!) 158/58  Pulse: 78 84  87  Resp: 18 18  18   Temp: 98.4 F (36.9 C) 98.6 F (37 C)  98.6 F (37 C)  TempSrc: Oral Oral    SpO2: 100% 98%  97%  Weight:  82.2 kg 82.2 kg   Height:        Intake/Output Summary (Last 24 hours) at 08/06/2018 1046 Last data filed at 08/06/2018 0507 Gross per 24 hour  Intake 721.53 ml  Output 3101 ml  Net -2379.47 ml  Filed Weights   08/05/18 1517 08/05/18 2131 08/06/18 0500  Weight: 82.2 kg 82.2 kg 82.2 kg    Examination:  General exam: Appears calm and comfortable  Respiratory system: Clear to auscultation. Respiratory effort normal. Cardiovascular system: S1 & S2 heard, RRR. No JVD, murmurs, rubs, gallops or clicks. No pedal edema. Gastrointestinal system: Abdomen is nondistended, soft and nontender. No organomegaly or masses felt. Normal bowel sounds heard. Central nervous system: Alert and oriented. No focal neurological deficits. Extremities: +Left BKA, +Right foot first 2 toes s/p amputation     Psychiatry: Judgement and insight appear normal. Mood &  affect appropriate.   Data Reviewed: I have personally reviewed following labs and imaging studies  CBC: Recent Labs  Lab 08/04/18 2035 08/05/18 0416 08/06/18 0340  WBC 8.8 10.8* 9.6  NEUTROABS 6.1  --   --   HGB 13.4 11.7* 12.3*  HCT 42.6 37.6* 38.8*  MCV 86.4 86.0 85.1  PLT 423* 461* 097*   Basic Metabolic Panel: Recent Labs  Lab 08/04/18 2035 08/05/18 0416 08/06/18 0340  NA 134* 133* 131*  K 4.2 4.3 3.8  CL 94* 95* 91*  CO2 19* 20* 24  GLUCOSE 308* 227* 217*  BUN 54* 59* 31*  CREATININE 12.31* 12.85* 8.49*  CALCIUM 8.7* 8.5* 8.0*   GFR: Estimated Creatinine Clearance: 13.3 mL/min (A) (by C-G formula based on SCr of 8.49 mg/dL (H)). Liver Function Tests: Recent Labs  Lab 08/04/18 2035  AST 13*  ALT 12  ALKPHOS 54  BILITOT 0.7  PROT 8.0  ALBUMIN 2.8*   No results for input(s): LIPASE, AMYLASE in the last 168 hours. No results for input(s): AMMONIA in the last 168 hours. Coagulation Profile: No results for input(s): INR, PROTIME in the last 168 hours. Cardiac Enzymes: Recent Labs  Lab 08/04/18 2035  TROPONINI 0.03*   BNP (last 3 results) No results for input(s): PROBNP in the last 8760 hours. HbA1C: No results for input(s): HGBA1C in the last 72 hours. CBG: Recent Labs  Lab 08/05/18 0335 08/05/18 0804 08/05/18 1626 08/05/18 2131 08/06/18 0722  GLUCAP 207* 114* 120* 297* 134*   Lipid Profile: No results for input(s): CHOL, HDL, LDLCALC, TRIG, CHOLHDL, LDLDIRECT in the last 72 hours. Thyroid Function Tests: No results for input(s): TSH, T4TOTAL, FREET4, T3FREE, THYROIDAB in the last 72 hours. Anemia Panel: No results for input(s): VITAMINB12, FOLATE, FERRITIN, TIBC, IRON, RETICCTPCT in the last 72 hours. Sepsis Labs: Recent Labs  Lab 08/04/18 2035  LATICACIDVEN 1.8    Recent Results (from the past 240 hour(s))  Blood culture (routine x 2)     Status: None (Preliminary result)   Collection Time: 08/04/18  8:35 PM  Result Value Ref  Range Status   Specimen Description BLOOD RIGHT HAND  Final   Special Requests   Final    BOTTLES DRAWN AEROBIC AND ANAEROBIC Blood Culture adequate volume   Culture   Final    NO GROWTH 2 DAYS Performed at [facility], [place] [address] [place], [place] [address]    Report Status PENDING  Incomplete  Blood culture (routine x 2)     Status: None (Preliminary result)   Collection Time: 08/04/18  9:30 PM  Result Value Ref Range Status   Specimen Description BLOOD RIGHT HAND  Final   Special Requests   Final    BOTTLES DRAWN AEROBIC AND ANAEROBIC Blood Culture adequate volume   Culture   Final    NO GROWTH 2 DAYS Performed at [facility], [place] [address], [place], [place] [address]    Report Status PENDING  Incomplete  MRSA PCR Screening     Status: Abnormal   Collection Time: 08/05/18  2:08 AM  Result Value Ref Range Status   MRSA by PCR POSITIVE (A) NEGATIVE Final    Comment:        The GeneXpert MRSA Assay (FDA approved for NASAL specimens only), is one component of a comprehensive MRSA colonization surveillance program. It is not intended to diagnose MRSA infection nor to guide or monitor treatment for MRSA infections. RESULT CALLED TO, READ BACK BY AND VERIFIED WITHLendell Caprice RN [id number] 12/21/196 A BROWNING Performed at [facility], [place] [address], [place], [place] [address]        Radiology Studies: Dg Chest 1 View  Result Date: 08/04/2018 CLINICAL DATA:  Recent right toe pain. Patient on dialysis. EXAM: CHEST  1 VIEW COMPARISON:  Chest radiograph performed 07/12/2018 FINDINGS: The lungs are well-aerated. Minimal bibasilar atelectasis is noted. There is no evidence of pleural effusion or pneumothorax. The cardiomediastinal silhouette is borderline normal in size. The patient is status post median sternotomy, with evidence of prior CABG. No acute osseous abnormalities are seen. IMPRESSION: Minimal bibasilar atelectasis noted; lungs otherwise clear.  Electronically Signed   By: [name] M.D.   On: 08/04/2018 21:28   Mr Foot Right Wo Contrast  Addendum Date: 08/06/2018   ADDENDUM REPORT: 08/06/2018 09:27 ADDENDUM: These results were called by telephone at the time of interpretation on 08/06/2018 at 9:23 am to Dr. Maylene Roes, who verbally acknowledged these results. Electronically Signed   By: Van Clines M.D.   On: 08/06/2018 09:27   Result Date: 08/06/2018 CLINICAL DATA:  Right foot pain.  Dry gangrene of the small toe. EXAM: MRI OF THE RIGHT FOREFOOT WITHOUT CONTRAST TECHNIQUE: Multiplanar, multisequence MR imaging of the right forefoot was performed. No intravenous contrast was administered. COMPARISON:  Radiographs 08/04/2018 FINDINGS: Despite efforts by the technologist and patient, motion artifact is present on today's exam and could not be eliminated. This reduces exam sensitivity and specificity. Bones/Joint/Cartilage Prior amputation of first and second toes. Accentuated T2 and low T1 signal in the phalanges of the small toe favoring osteomyelitis. Small amount of edema along the medial aspect of the head of the fifth metatarsal could also reflect early osteomyelitis. Reduced T1 signal in the middle and distal phalanges of the fourth toe, suspicious for osteomyelitis. Ligaments Lisfranc ligament intact. Muscles and Tendons Low-level edema tracks within along the plantar musculature of the foot, notably the quadratus plantae a and flexor digitorum brevis, more likely neurogenic than from myositis. Generalized muscular atrophy. Expansion and some laxity flexor hallucis longus, which is likely discontinuous distally due to the amputation. Soft tissues Poor definition of the soft tissues along the dorsum of the fifth toe. IMPRESSION: 1. 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 Suspected involvement of the fourth  toe is supplemental to the original preliminary report. Radiology assistant personnel have been notified to put me in telephone contact with the referring physician or the referring physician's clinical representative in order to discuss these findings. Once this communication is established I will issue an addendum to this report for documentation purposes. 2. Poor definition of the soft tissues of the dorsum of the fifth toe, potentially from either artifact or from actual erosions/absence of  tissue. 3. Extensive motion artifact.  Poor fat saturation in the toes. Electronically Signed: By: Van Clines M.D. On: 08/06/2018 09:12   Dg Foot Complete Right  Result Date: 08/04/2018 CLINICAL DATA:  Subacute onset of right fifth toe pain. History of toe amputation. Patient on dialysis. EXAM: RIGHT FOOT COMPLETE - 3+ VIEW COMPARISON:  Right foot radiographs performed 03/07/2017 FINDINGS: There is no evidence of fracture or dislocation. The joint spaces are preserved. No definite osseous erosions are seen to suggest osteomyelitis, though evaluation for osteomyelitis is limited on radiograph. There is no evidence of talar subluxation; the subtalar joint is unremarkable in appearance. Diffuse vascular calcifications are seen. The patient is status post amputation at the first and second toes. IMPRESSION: 1. No evidence of fracture or dislocation. 2. No definite osseous erosions seen to suggest osteomyelitis, though evaluation for osteomyelitis is limited on radiograph. MRI could be considered for further evaluation to assess for osteomyelitis, as deemed clinically appropriate. 3. Diffuse vascular calcifications seen. Electronically Signed   By: [name] M.D.   On: 08/04/2018 21:30   Vas [place] Burnard Bunting With/wo Tbi  Result Date: 08/05/2018 LOWER EXTREMITY DOPPLER STUDY Indications: Gangrene. High Risk Factors: Hypertension, Diabetes, current smoker. Other Factors: ESRD on dialysis, left access.  Vascular  Interventions: Multiple toe amputations on the right. 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 Comparison Study: Prior study from 11/01/17 is available for comparison Performing Technologist: Sharion Dove RVS  Examination Guidelines: A complete evaluation includes at minimum, Doppler waveform signals and systolic blood pressure reading at the level of bilateral brachial, anterior tibial, and posterior tibial arteries, when vessel segments are accessible. Bilateral testing is considered an integral part of a complete examination. Photoelectric Plethysmograph (PPG) waveforms and toe systolic pressure readings are included as required and additional duplex testing as needed. Limited examinations for reoccurring indications may be performed as noted.  ABI Findings: +---------+------------------+-----+----------+-------------------+ Right    Rt Pressure (mmHg)IndexWaveform  Comment             +---------+------------------+-----+----------+-------------------+ Brachial 255                    biphasic  Blood pressure >255 +---------+------------------+-----+----------+-------------------+ PTA      254               1.00 monophasicNon comp            +---------+------------------+-----+----------+-------------------+ DP       213               0.84 monophasic                    +---------+------------------+-----+----------+-------------------+ Great Toe                                 amputation          +---------+------------------+-----+----------+-------------------+ +--------+------------------+-----+--------+-------+ Left    Lt Pressure (mmHg)IndexWaveformComment +--------+------------------+-----+--------+-------+ Brachial                               access  +--------+------------------+-----+--------+-------+ PTA                                    BKA     +--------+------------------+-----+--------+-------+ DP  BKA      +--------+------------------+-----+--------+-------+ +-------+-----------+-----------+------------+------------+ ABI/TBIToday's ABIToday's TBIPrevious ABIPrevious TBI +-------+-----------+-----------+------------+------------+ Right  non comp   Toe amp    non comp    toe amp      +-------+-----------+-----------+------------+------------+ Left   BKA                                            +-------+-----------+-----------+------------+------------+ Right ABIs appear essentially unchanged.  Summary: Right: Resting right ankle-brachial index indicates noncompressible right lower extremity arteries.  *See table(s) above for measurements and observations.  Electronically signed by Monica Martinez MD on 08/05/2018 at 11:01:37 AM.   Final       Scheduled Meds: . aspirin EC  81 mg Oral Daily  . atorvastatin  80 mg Oral q1800  . [START ON 08/07/2018] calcitRIOL  1.25 mcg Oral Q M,W,F-HD  . carvedilol  6.25 mg Oral Daily  . Chlorhexidine Gluconate Cloth  6 each Topical Q0600  . clopidogrel  75 mg Oral Daily  . heparin  5,000 Units Subcutaneous Q8H  . insulin aspart  0-9 Units Subcutaneous TID WC  . insulin glargine  6 Units Subcutaneous QHS  . lamoTRIgine  150 mg Oral QHS  . lanthanum  2,000 mg Oral TID WC  . sertraline  50 mg Oral Daily   Continuous Infusions: . sodium chloride 250 mL (08/05/18 2244)  . piperacillin-tazobactam (ZOSYN)  IV 3.375 g (08/05/18 2246)  . [START ON 08/07/2018] vancomycin       LOS: 1 day    Time spent: 30 minutes   Dessa Phi, DO [facility] [email or web address] Password [facility] 08/06/2018, 10:46 AM

## 2018-08-06 NOTE — Progress Notes (Signed)
[facility]  Subjective  - No acute events.  Right 5th toe still painful.  In dialysis this am.   Objective (!) 158/58 87 98.6 F (37 C) 18 97%  Intake/Output Summary (Last 24 hours) at 08/06/2018 0938 Last data filed at 08/06/2018 0507 Gross per 24 hour  Intake 721.53 ml  Output 3101 ml  Net -2379.47 ml    Right DP/PT/peroneal signals R 5th toe dry gangrene  Laboratory Lab Results: Recent Labs    08/05/18 0416 08/06/18 0340  WBC 10.8* 9.6  HGB 11.7* 12.3*  HCT 37.6* 38.8*  PLT 461* 460*   BMET Recent Labs    08/05/18 0416 08/06/18 0340  NA 133* 131*  K 4.3 3.8  CL 95* 91*  CO2 20* 24  GLUCOSE 227* 217*  BUN 59* 31*  CREATININE 12.85* 8.49*  CALCIUM 8.5* 8.0*    COAG Lab Results  Component Value Date   INR 1.25 11/04/2017   INR 1.00 10/30/2017   INR 1.05 09/13/2014   No results found for: PTT  Assessment/Planning: Will plan arteriogram later this week.  Suspect he has right tibial disease.  [name] has performed all of his amputations and should be engaged for right 5th toe gangrene this week.  [name] 08/06/2018 9:38 AM --

## 2018-08-07 ENCOUNTER — Encounter (HOSPITAL_COMMUNITY)
Admission: EM | Disposition: A | Discharge status: Home Health | Payer: Self-pay | Source: Home / Self Care | Attending: Internal Medicine

## 2018-08-07 ENCOUNTER — Inpatient Hospital Stay (HOSPITAL_COMMUNITY): Payer: Medicaid Other | Admitting: Anesthesiology

## 2018-08-07 ENCOUNTER — Encounter (HOSPITAL_COMMUNITY): Payer: Self-pay | Admitting: Anesthesiology

## 2018-08-07 DIAGNOSIS — M79A21 Nontraumatic compartment syndrome of right lower extremity: Secondary | ICD-10-CM

## 2018-08-07 HISTORY — PX: PERIPHERAL VASCULAR BALLOON ANGIOPLASTY: CATH118281

## 2018-08-07 HISTORY — PX: PERIPHERAL VASCULAR ATHERECTOMY: CATH118256

## 2018-08-07 HISTORY — PX: FASCIOTOMY: SHX132

## 2018-08-07 HISTORY — PX: HEMATOMA EVACUATION: SHX5118

## 2018-08-07 HISTORY — PX: ABDOMINAL AORTOGRAM W/LOWER EXTREMITY: CATH118223

## 2018-08-07 LAB — GLUCOSE, CAPILLARY
Glucose-Capillary: 167 mg/dL — ABNORMAL HIGH (ref 70–99)
Glucose-Capillary: 141 mg/dL — ABNORMAL HIGH (ref 70–99)
Glucose-Capillary: 145 mg/dL — ABNORMAL HIGH (ref 70–99)
Glucose-Capillary: 258 mg/dL — ABNORMAL HIGH (ref 70–99)
Glucose-Capillary: 267 mg/dL — ABNORMAL HIGH (ref 70–99)
Glucose-Capillary: 235 mg/dL — ABNORMAL HIGH (ref 70–99)

## 2018-08-07 LAB — CBC
HCT: 40.9 % (ref 39.0–52.0)
Hemoglobin: 13.2 g/dL (ref 13.0–17.0)
MCH: 28.1 pg (ref 26.0–34.0)
MCHC: 32.3 g/dL (ref 30.0–36.0)
MCV: 87.2 fL (ref 80.0–100.0)
Platelets: 483 10*3/uL — ABNORMAL HIGH (ref 150–400)
RBC: 4.69 MIL/uL (ref 4.22–5.81)
RDW: 15 % (ref 11.5–15.5)
WBC: 10.5 10*3/uL (ref 4.0–10.5)
nRBC: 0 % (ref 0.0–0.2)

## 2018-08-07 LAB — BASIC METABOLIC PANEL
Anion gap: 13 (ref 5–15)
BUN: 33 mg/dL — AB (ref 6–20)
CHLORIDE: 92 mmol/L — AB (ref 98–111)
CO2: 25 mmol/L (ref 22–32)
Calcium: 8.7 mg/dL — ABNORMAL LOW (ref 8.9–10.3)
Creatinine, Ser: 7.14 mg/dL — ABNORMAL HIGH (ref 0.61–1.24)
GFR calc Af Amer: 10 mL/min — ABNORMAL LOW (ref >60)
GFR calc non Af Amer: 9 mL/min — ABNORMAL LOW (ref >60)
Glucose, Bld: 201 mg/dL — ABNORMAL HIGH (ref 70–99)
Potassium: 4.4 mmol/L (ref 3.5–5.1)
Sodium: 130 mmol/L — ABNORMAL LOW (ref 135–145)

## 2018-08-07 LAB — POCT ACTIVATED CLOTTING TIME: Activated Clotting Time: 279 s

## 2018-08-07 SURGERY — ABDOMINAL AORTOGRAM W/LOWER EXTREMITY
Anesthesia: LOCAL | Laterality: Right

## 2018-08-07 SURGERY — EVACUATION HEMATOMA
Anesthesia: General | Site: Leg Lower | Laterality: Right

## 2018-08-07 MED ORDER — FENTANYL CITRATE (PF) 100 MCG/2ML IJ SOLN
INTRAMUSCULAR | Status: DC | PRN
  Administered 2018-08-07: 50 ug via INTRAVENOUS

## 2018-08-07 MED ORDER — CEFAZOLIN SODIUM-DEXTROSE 2-3 GM-%(50ML) IV SOLR
INTRAVENOUS | Status: DC | PRN
  Administered 2018-08-07: 2 g via INTRAVENOUS

## 2018-08-07 MED ORDER — SUCCINYLCHOLINE CHLORIDE 200 MG/10ML IV SOSY
PREFILLED_SYRINGE | INTRAVENOUS | Status: AC
  Filled 2018-08-07: qty 10

## 2018-08-07 MED ORDER — VERAPAMIL HCL 2.5 MG/ML IV SOLN
INTRAVENOUS | Status: AC
  Filled 2018-08-07: qty 2

## 2018-08-07 MED ORDER — HEPARIN (PORCINE) IN NACL 1000-0.9 UT/500ML-% IV SOLN
INTRAVENOUS | Status: DC | PRN
  Administered 2018-08-07 (×2): 500 mL

## 2018-08-07 MED ORDER — INSULIN ASPART 100 UNIT/ML ~~LOC~~ SOLN
SUBCUTANEOUS | Status: AC
  Filled 2018-08-07: qty 1

## 2018-08-07 MED ORDER — VIPERSLIDE LUBRICANT OPTIME
TOPICAL | Status: DC | PRN
  Administered 2018-08-07: 14:00:00 via SURGICAL_CAVITY

## 2018-08-07 MED ORDER — IODIXANOL 320 MG/ML IV SOLN
INTRAVENOUS | Status: DC | PRN
  Administered 2018-08-07: 135 mL via INTRA_ARTERIAL

## 2018-08-07 MED ORDER — NITROGLYCERIN IN D5W 200-5 MCG/ML-% IV SOLN
INTRAVENOUS | Status: AC
  Filled 2018-08-07: qty 250

## 2018-08-07 MED ORDER — HEPARIN SODIUM (PORCINE) 1000 UNIT/ML IJ SOLN
INTRAMUSCULAR | Status: AC
  Filled 2018-08-07: qty 1

## 2018-08-07 MED ORDER — HYDRALAZINE HCL 20 MG/ML IJ SOLN
INTRAMUSCULAR | Status: AC
  Filled 2018-08-07: qty 1

## 2018-08-07 MED ORDER — LIDOCAINE HCL (PF) 1 % IJ SOLN
INTRAMUSCULAR | Status: DC | PRN
  Administered 2018-08-07: 20 mL

## 2018-08-07 MED ORDER — SODIUM CHLORIDE 0.9 % IV SOLN: 250.0000 mL | INTRAVENOUS | Status: DC | PRN

## 2018-08-07 MED ORDER — VANCOMYCIN HCL IN DEXTROSE 1-5 GM/200ML-% IV SOLN: 1000.0000 mg | INTRAVENOUS | Status: DC

## 2018-08-07 MED ORDER — SODIUM CHLORIDE 0.9% FLUSH: 3.0000 mL | INTRAVENOUS | Status: DC | PRN

## 2018-08-07 MED ORDER — ROCURONIUM BROMIDE 50 MG/5ML IV SOSY
PREFILLED_SYRINGE | INTRAVENOUS | Status: AC
  Filled 2018-08-07: qty 5

## 2018-08-07 MED ORDER — LIDOCAINE 2% (20 MG/ML) 5 ML SYRINGE
INTRAMUSCULAR | Status: AC
  Filled 2018-08-07: qty 5

## 2018-08-07 MED ORDER — MIDAZOLAM HCL 5 MG/5ML IJ SOLN
INTRAMUSCULAR | Status: DC | PRN
  Administered 2018-08-07 (×2): 1 mg via INTRAVENOUS

## 2018-08-07 MED ORDER — HEPARIN (PORCINE) IN NACL 1000-0.9 UT/500ML-% IV SOLN
INTRAVENOUS | Status: AC
  Filled 2018-08-07: qty 1000

## 2018-08-07 MED ORDER — ASPIRIN EC 81 MG PO TBEC: 81.0000 mg | DELAYED_RELEASE_TABLET | Freq: Every day | ORAL | Status: DC

## 2018-08-07 MED ORDER — LIDOCAINE HCL (CARDIAC) PF 100 MG/5ML IV SOSY
PREFILLED_SYRINGE | INTRAVENOUS | Status: DC | PRN
  Administered 2018-08-07: 60 mg via INTRATRACHEAL

## 2018-08-07 MED ORDER — LIDOCAINE HCL (PF) 1 % IJ SOLN
INTRAMUSCULAR | Status: AC
  Filled 2018-08-07: qty 30

## 2018-08-07 MED ORDER — FENTANYL CITRATE (PF) 250 MCG/5ML IJ SOLN
INTRAMUSCULAR | Status: AC
  Filled 2018-08-07: qty 5

## 2018-08-07 MED ORDER — FENTANYL CITRATE (PF) 100 MCG/2ML IJ SOLN
INTRAMUSCULAR | Status: AC
  Filled 2018-08-07: qty 4

## 2018-08-07 MED ORDER — LABETALOL HCL 5 MG/ML IV SOLN
10.0000 mg | INTRAVENOUS | Status: DC | PRN
  Administered 2018-08-07: 10 mg via INTRAVENOUS

## 2018-08-07 MED ORDER — SODIUM CHLORIDE 0.9 % IV SOLN
INTRAVENOUS | Status: DC | PRN
  Administered 2018-08-07: 21:00:00 via INTRAVENOUS

## 2018-08-07 MED ORDER — PROPOFOL 10 MG/ML IV BOLUS
INTRAVENOUS | Status: DC | PRN
  Administered 2018-08-07: 140 mg via INTRAVENOUS
  Administered 2018-08-07: 20 mg via INTRAVENOUS

## 2018-08-07 MED ORDER — ONDANSETRON HCL 4 MG/2ML IJ SOLN: 4.0000 mg | Freq: Four times a day (QID) | INTRAMUSCULAR | Status: DC | PRN

## 2018-08-07 MED ORDER — MIDAZOLAM HCL 2 MG/2ML IJ SOLN
INTRAMUSCULAR | Status: AC
  Filled 2018-08-07: qty 2

## 2018-08-07 MED ORDER — FENTANYL CITRATE (PF) 100 MCG/2ML IJ SOLN
25.0000 ug | INTRAMUSCULAR | Status: DC | PRN
  Administered 2018-08-07 (×3): 50 ug via INTRAVENOUS

## 2018-08-07 MED ORDER — LABETALOL HCL 5 MG/ML IV SOLN
INTRAVENOUS | Status: AC
  Filled 2018-08-07: qty 4

## 2018-08-07 MED ORDER — FENTANYL CITRATE (PF) 250 MCG/5ML IJ SOLN
INTRAMUSCULAR | Status: DC | PRN
  Administered 2018-08-07 (×2): 50 ug via INTRAVENOUS

## 2018-08-07 MED ORDER — MIDAZOLAM HCL 2 MG/2ML IJ SOLN
INTRAMUSCULAR | Status: DC | PRN
  Administered 2018-08-07 (×2): 1 mg via INTRAVENOUS

## 2018-08-07 MED ORDER — SODIUM CHLORIDE 0.9 % IV SOLN
INTRAVENOUS | Status: AC
  Filled 2018-08-07: qty 1.2

## 2018-08-07 MED ORDER — NITROGLYCERIN 1 MG/10 ML FOR IR/CATH LAB
INTRA_ARTERIAL | Status: DC | PRN
  Administered 2018-08-07: 200 ug via INTRA_ARTERIAL

## 2018-08-07 MED ORDER — ACETAMINOPHEN 325 MG PO TABS: 650.0000 mg | ORAL_TABLET | ORAL | Status: DC | PRN

## 2018-08-07 MED ORDER — VANCOMYCIN HCL IN DEXTROSE 1-5 GM/200ML-% IV SOLN
1000.0000 mg | INTRAVENOUS | Status: DC
  Filled 2018-08-07: qty 200

## 2018-08-07 MED ORDER — PROPOFOL 10 MG/ML IV BOLUS
INTRAVENOUS | Status: AC
  Filled 2018-08-07: qty 20

## 2018-08-07 MED ORDER — PHENYLEPHRINE HCL 10 MG/ML IJ SOLN
INTRAMUSCULAR | Status: DC | PRN
  Administered 2018-08-07: 40 ug via INTRAVENOUS

## 2018-08-07 MED ORDER — SODIUM CHLORIDE 0.9 % IV SOLN
INTRAVENOUS | Status: DC | PRN
  Administered 2018-08-07: 500 mL

## 2018-08-07 MED ORDER — MUPIROCIN 2 % EX OINT
1.0000 "application " | TOPICAL_OINTMENT | Freq: Two times a day (BID) | CUTANEOUS | Status: AC
  Administered 2018-08-08 – 2018-08-12 (×9): 1 via NASAL
  Filled 2018-08-07 (×2): qty 22

## 2018-08-07 MED ORDER — HYDRALAZINE HCL 20 MG/ML IJ SOLN
5.0000 mg | INTRAMUSCULAR | Status: DC | PRN
  Administered 2018-08-07: 5 mg via INTRAVENOUS

## 2018-08-07 MED ORDER — FENTANYL CITRATE (PF) 100 MCG/2ML IJ SOLN
INTRAMUSCULAR | Status: AC
  Filled 2018-08-07: qty 2

## 2018-08-07 MED ORDER — 0.9 % SODIUM CHLORIDE (POUR BTL) OPTIME
TOPICAL | Status: DC | PRN
  Administered 2018-08-07: 1000 mL

## 2018-08-07 MED ORDER — SODIUM CHLORIDE 0.9% FLUSH
3.0000 mL | Freq: Two times a day (BID) | INTRAVENOUS | Status: DC
  Administered 2018-08-07 – 2018-08-17 (×19): 3 mL via INTRAVENOUS

## 2018-08-07 MED ORDER — SUCCINYLCHOLINE CHLORIDE 20 MG/ML IJ SOLN
INTRAMUSCULAR | Status: DC | PRN
  Administered 2018-08-07: 100 mg via INTRAVENOUS

## 2018-08-07 MED ORDER — EPHEDRINE 5 MG/ML INJ
INTRAVENOUS | Status: AC
  Filled 2018-08-07: qty 10

## 2018-08-07 MED ORDER — HEPARIN SODIUM (PORCINE) 1000 UNIT/ML IJ SOLN
INTRAMUSCULAR | Status: DC | PRN
  Administered 2018-08-07: 9000 [IU] via INTRAVENOUS
  Administered 2018-08-07: 3000 [IU] via INTRAVENOUS

## 2018-08-07 MED ORDER — PHENYLEPHRINE 40 MCG/ML (10ML) SYRINGE FOR IV PUSH (FOR BLOOD PRESSURE SUPPORT)
PREFILLED_SYRINGE | INTRAVENOUS | Status: AC
  Filled 2018-08-07: qty 10

## 2018-08-07 MED ORDER — FENTANYL CITRATE (PF) 100 MCG/2ML IJ SOLN
INTRAMUSCULAR | Status: DC | PRN
  Administered 2018-08-07 (×2): 25 ug via INTRAVENOUS
  Administered 2018-08-07 (×2): 50 ug via INTRAVENOUS

## 2018-08-07 MED ORDER — ONDANSETRON HCL 4 MG/2ML IJ SOLN
INTRAMUSCULAR | Status: DC | PRN
  Administered 2018-08-07: 4 mg via INTRAVENOUS

## 2018-08-07 MED ORDER — FENTANYL CITRATE (PF) 100 MCG/2ML IJ SOLN
50.0000 ug | Freq: Once | INTRAMUSCULAR | Status: AC
  Administered 2018-08-07: 50 ug via INTRAVENOUS

## 2018-08-07 MED ORDER — SODIUM CHLORIDE (PF) 0.9 % IJ SOLN
INTRAMUSCULAR | Status: AC
  Filled 2018-08-07: qty 10

## 2018-08-07 MED ORDER — ONDANSETRON HCL 4 MG/2ML IJ SOLN
INTRAMUSCULAR | Status: AC
  Filled 2018-08-07: qty 2

## 2018-08-07 SURGICAL SUPPLY — 55 items
ADH SKN CLS APL DERMABOND .7 (GAUZE/BANDAGES/DRESSINGS)
BANDAGE ESMARK 6X9 LF (GAUZE/BANDAGES/DRESSINGS) IMPLANT
BNDG CMPR 9X6 STRL LF SNTH (GAUZE/BANDAGES/DRESSINGS)
BNDG ESMARK 6X9 LF (GAUZE/BANDAGES/DRESSINGS)
BNDG GAUZE ELAST 4 BULKY (GAUZE/BANDAGES/DRESSINGS) ×6 IMPLANT
CANISTER SUCT 3000ML PPV (MISCELLANEOUS) ×4 IMPLANT
CANNULA VESSEL 3MM 2 BLNT TIP (CANNULA) ×6 IMPLANT
CLIP LIGATING EXTRA MED SLVR (CLIP) ×4 IMPLANT
CLIP LIGATING EXTRA SM BLUE (MISCELLANEOUS) ×4 IMPLANT
COVER WAND RF STERILE (DRAPES) ×2 IMPLANT
CUFF TOURNIQUET SINGLE 18IN (TOURNIQUET CUFF) IMPLANT
CUFF TOURNIQUET SINGLE 24IN (TOURNIQUET CUFF) IMPLANT
CUFF TOURNIQUET SINGLE 34IN LL (TOURNIQUET CUFF) IMPLANT
CUFF TOURNIQUET SINGLE 44IN (TOURNIQUET CUFF) IMPLANT
DERMABOND ADVANCED (GAUZE/BANDAGES/DRESSINGS)
DERMABOND ADVANCED .7 DNX12 (GAUZE/BANDAGES/DRESSINGS) ×2 IMPLANT
DRAIN SNY 10X20 3/4 PERF (WOUND CARE) IMPLANT
DRAPE X-RAY CASS 24X20 (DRAPES) IMPLANT
ELECT REM PT RETURN 9FT ADLT (ELECTROSURGICAL) ×4
ELECTRODE REM PT RTRN 9FT ADLT (ELECTROSURGICAL) ×2 IMPLANT
EVACUATOR SILICONE 100CC (DRAIN) IMPLANT
GLOVE BIO SURGEON STRL SZ7.5 (GLOVE) ×2 IMPLANT
GLOVE BIOGEL PI IND STRL 7.5 (GLOVE) IMPLANT
GLOVE BIOGEL PI INDICATOR 7.5 (GLOVE) ×4
GLOVE SS BIOGEL STRL SZ 7.5 (GLOVE) ×2 IMPLANT
GLOVE SUPERSENSE BIOGEL SZ 7.5 (GLOVE) ×2
GOWN STRL REUS W/ TWL LRG LVL3 (GOWN DISPOSABLE) ×6 IMPLANT
GOWN STRL REUS W/TWL LRG LVL3 (GOWN DISPOSABLE) ×12
INSERT FOGARTY SM (MISCELLANEOUS) ×2 IMPLANT
KIT BASIN OR (CUSTOM PROCEDURE TRAY) ×4 IMPLANT
KIT TURNOVER KIT B (KITS) ×4 IMPLANT
NS IRRIG 1000ML POUR BTL (IV SOLUTION) ×8 IMPLANT
PACK CV ACCESS (CUSTOM PROCEDURE TRAY) IMPLANT
PACK GENERAL/GYN (CUSTOM PROCEDURE TRAY) IMPLANT
PACK PERIPHERAL VASCULAR (CUSTOM PROCEDURE TRAY) ×4 IMPLANT
PACK UNIVERSAL I (CUSTOM PROCEDURE TRAY) IMPLANT
PAD ABD 8X10 STRL (GAUZE/BANDAGES/DRESSINGS) ×2 IMPLANT
PAD ARMBOARD 7.5X6 YLW CONV (MISCELLANEOUS) ×8 IMPLANT
PADDING CAST COTTON 6X4 STRL (CAST SUPPLIES) IMPLANT
SET COLLECT BLD 21X3/4 12 (NEEDLE) IMPLANT
STAPLER VISISTAT 35W (STAPLE) ×2 IMPLANT
STOPCOCK 4 WAY LG BORE MALE ST (IV SETS) IMPLANT
SUT ETHILON 3 0 PS 1 (SUTURE) IMPLANT
SUT PROLENE 5 0 C 1 24 (SUTURE) ×8 IMPLANT
SUT PROLENE 6 0 CC (SUTURE) IMPLANT
SUT SILK 2 0 SH (SUTURE) IMPLANT
SUT VIC AB 2-0 CTX 36 (SUTURE) ×4 IMPLANT
SUT VIC AB 3-0 SH 27 (SUTURE) ×4
SUT VIC AB 3-0 SH 27X BRD (SUTURE) ×4 IMPLANT
TAPE CLOTH SURG 4X10 WHT LF (GAUZE/BANDAGES/DRESSINGS) ×2 IMPLANT
TOWEL GREEN STERILE (TOWEL DISPOSABLE) ×4 IMPLANT
TRAY FOLEY MTR SLVR 16FR STAT (SET/KITS/TRAYS/PACK) ×2 IMPLANT
TUBING EXTENTION W/L.L. (IV SETS) IMPLANT
UNDERPAD 30X30 (UNDERPADS AND DIAPERS) ×4 IMPLANT
WATER STERILE IRR 1000ML POUR (IV SOLUTION) ×4 IMPLANT

## 2018-08-07 SURGICAL SUPPLY — 30 items
BALLN STERLING OTW 3X220X150 (BALLOONS) ×2
BALLOON STERLING OTW 3X220X150 (BALLOONS) IMPLANT
CATH CXI 2.6F 65 ST (CATHETERS) ×2
CATH CXI SUPP 2.6F 150 ST (CATHETERS) ×1 IMPLANT
CATH OMNI FLUSH 5F 65CM (CATHETERS) ×1 IMPLANT
CATH QUICKCROSS .018X135CM (MICROCATHETER) ×1 IMPLANT
CATH SOFT-VU 4F 65 STRAIGHT (CATHETERS) IMPLANT
CATH SOFT-VU STRAIGHT 4F 65CM (CATHETERS) ×2
CATH SPRT STRG 65X2.6FR ACPT (CATHETERS) IMPLANT
CLOSURE MYNX CONTROL 6F/7F (Vascular Products) ×1 IMPLANT
DEVICE EMBOSHIELD NAV6 2.5-4.8 (FILTER) ×1 IMPLANT
DIAMONDBACK CLASSIC OAS 1.5MM (CATHETERS) ×2
KIT ENCORE 26 ADVANTAGE (KITS) ×1 IMPLANT
KIT MICROPUNCTURE NIT STIFF (SHEATH) ×1 IMPLANT
KIT PV (KITS) ×2 IMPLANT
LUBRICANT VIPERSLIDE CORONARY (MISCELLANEOUS) ×2 IMPLANT
SHEATH FLEX ANSEL ANG 6F 45CM (SHEATH) ×1 IMPLANT
SHEATH MICROPUNCTURE PEDAL 4FR (SHEATH) ×3 IMPLANT
SHEATH PINNACLE 5F 10CM (SHEATH) ×1 IMPLANT
SHEATH PINNACLE 6F 10CM (SHEATH) ×1 IMPLANT
SHEATH PROBE COVER 6X72 (BAG) ×1 IMPLANT
SHIELD RADPAD SCOOP 12X17 (MISCELLANEOUS) ×1 IMPLANT
SYR MEDRAD MARK V 150ML (SYRINGE) ×1 IMPLANT
SYSTEM DIMNDBCK CLSC OAS 1.5MM (CATHETERS) IMPLANT
TRANSDUCER W/STOPCOCK (MISCELLANEOUS) ×2 IMPLANT
TRAY PV CATH (CUSTOM PROCEDURE TRAY) ×2 IMPLANT
WIRE BENTSON .035X145CM (WIRE) ×1 IMPLANT
WIRE G V18X300CM (WIRE) ×5 IMPLANT
WIRE ROSEN-J .035X180CM (WIRE) ×1 IMPLANT
WIRE VIPER ADVANCE .017X335CM (WIRE) ×1 IMPLANT

## 2018-08-07 NOTE — Anesthesia Procedure Notes (Signed)
Procedure Name: Intubation Date/Time: 08/07/2018 9:11 PM Performed by: Clovis Cao, CRNA Pre-anesthesia Checklist: Patient identified, Emergency Drugs available, Suction available, Patient being monitored and Timeout performed Patient Re-evaluated:Patient Re-evaluated prior to induction Oxygen Delivery Method: Circle system utilized Preoxygenation: Pre-oxygenation with 100% oxygen Induction Type: IV induction, Rapid sequence and Cricoid Pressure applied Laryngoscope Size: Miller and 2 Grade View: Grade I Tube type: Oral Tube size: 7.5 mm Number of attempts: 1 Airway Equipment and Method: Stylet Placement Confirmation: ETT inserted through vocal cords under direct vision,  positive ETCO2 and breath sounds checked- equal and bilateral Secured at: 22 cm Tube secured with: Tape Dental Injury: Teeth and Oropharynx as per pre-operative assessment

## 2018-08-07 NOTE — Progress Notes (Addendum)
[facility] Progress Note   Subjective:   Seen in room. C/o R foot pain. For vascular procedure today   Objective Vitals:   08/06/18 1633 08/06/18 2055 08/07/18 0500 08/07/18 0500  BP: 132/73 (!) 140/57  (!) 154/96  Pulse: 90 85  97  Resp: 18 18  19   Temp:  98 F (36.7 C)  98.1 F (36.7 C)  TempSrc:      SpO2: 99% 97%  98%  Weight:   82.7 kg   Height:       Physical Exam General: Well appearing man, NAD Heart: RRR; no murmur Lungs: CTAB Extremities: L BKA without stump edema. RLE without edema, s/p prior amputation 1st/2nd toes. 5th toe dry necrotic/gangrene appearance. Dialysis Access: L AVF + bruit  Additional Objective Labs: Basic Metabolic Panel: Recent Labs  Lab 08/05/18 0416 08/06/18 0340 08/07/18 0506  NA 133* 131* 130*  K 4.3 3.8 4.4  CL 95* 91* 92*  CO2 20* 24 25  GLUCOSE 227* 217* 201*  BUN 59* 31* 33*  CREATININE 12.85* 8.49* 7.14*  CALCIUM 8.5* 8.0* 8.7*   Liver Function Tests: Recent Labs  Lab 08/04/18 2035  AST 13*  ALT 12  ALKPHOS 54  BILITOT 0.7  PROT 8.0  ALBUMIN 2.8*   CBC: Recent Labs  Lab 08/04/18 2035 08/05/18 0416 08/06/18 0340 08/07/18 0506  WBC 8.8 10.8* 9.6 10.5  NEUTROABS 6.1  --   --   --   HGB 13.4 11.7* 12.3* 13.2  HCT 42.6 37.6* 38.8* 40.9  MCV 86.4 86.0 85.1 87.2  PLT 423* 461* 460* 483*   Blood Culture    Component Value Date/Time   SDES BLOOD RIGHT HAND 08/04/2018 2130   SPECREQUEST  08/04/2018 2130    BOTTLES DRAWN AEROBIC AND ANAEROBIC Blood Culture adequate volume   CULT  08/04/2018 2130    NO GROWTH 2 DAYS Performed at [place] [address], [place], [place] [address]    REPTSTATUS PENDING 08/04/2018 2130   Cardiac Enzymes: Recent Labs  Lab 08/04/18 2035  TROPONINI 0.03*   CBG: Recent Labs  Lab 08/05/18 2131 08/06/18 0722 08/06/18 1250 08/06/18 1632 08/06/18 2054  GLUCAP 297* 134* 172* 228* 192*   Studies/Results:  Medications: . sodium chloride 250  mL (08/05/18 2244)  . piperacillin-tazobactam (ZOSYN)  IV 3.375 g (08/06/18 2311)  . vancomycin Stopped (08/06/18 1430)   . aspirin EC  81 mg Oral Daily  . atorvastatin  80 mg Oral q1800  . calcitRIOL  1.25 mcg Oral Q M,W,F-HD  . carvedilol  6.25 mg Oral Daily  . Chlorhexidine Gluconate Cloth  6 each Topical Q0600  . clopidogrel  75 mg Oral Daily  . heparin  5,000 Units Subcutaneous Q8H  . insulin aspart  0-9 Units Subcutaneous TID WC  . insulin glargine  6 Units Subcutaneous QHS  . lamoTRIgine  150 mg Oral QHS  . lanthanum  2,000 mg Oral TID WC  . sertraline  50 mg Oral Daily    Dialysis Orders: MWF at [facility] 4hr, 200dialyzer, 500/800, EDW 80kg, 2K/2Ca, UFP #2, AVF, Heparin 2400 bolus - Calcitriol 1.79mcg PO q HD - No ESA (Last Hgb 14)  Assessment/Plan: 1.  R 5th toe gangrene: MRI suggests osteo involving 4th an 5th toes.On Vanc/Zosyn. BC Neg to date. VVS following, for arteriogram 12/24 and will need amputation of some kind (unclear what level at this time, awaiting vascular studies). 2.  ESRD:  Following HD holiday schedule (usual MWF =  SuTuF). Next HD 12/24 3.  Hypertension/volume: BP improving, UF as tolerated. Net UF 3L on 12/22.  4.  Anemia: Hgb > 13. No ESA for now. 5.  Metabolic bone disease: Ca ok, Phos pending here (usually high). Continue Fosrenol as binder. 6.  CAD (Hx CABG) 7.  Type 2 DM: Per primary  Lynnda Child PA-C [facility] Pager [phone] 08/07/2018,8:31 AM   I have seen and examined this patient and agree with plan and assessment in the above note with renal recommendations/intervention highlighted.  He is resting comfortably and is hoping his right foot is salvageable.  Plan for HD tomorrow and back on schedule Friday.   Governor [place] Ay[place]ott,MD 08/07/2018 12:55 PM

## 2018-08-07 NOTE — Op Note (Signed)
Patient name: [name]. MRN: [id number] DOB: 08-12-1976 Sex: male  08/07/2018 Pre-operative Diagnosis: Critical limb ischemia of the right lower extremity with tissue loss (fifth toe gangrene) Post-operative diagnosis:  Same Surgeon:  [name], MD Procedure Performed: 1.  Ultrasound-guided access of the left common femoral artery 2.  Aortogram 3.  Right lower extremity arteriogram with selection of tertiary branches 4.  Ultrasound-guided retrograde cannulation of the right anterior tibial artery at the ankle 5.  Right tibioperoneal trunk and peroneal mechanical atherectomy (1.5 mm classic CSI atherectomy device) 6.  Right tibioperoneal trunk and peroneal angioplasty (3 mm x 220 mm Sterling) 7.  Mynx closure of the left common femoral artery  Indications: Patient is a 42 year old male with history of end-stage renal disease and diabetes that presented this weekend with dry gangrene of his right fifth toe.  He has previously undergone right first and second toe amputations by [name] and also has a contralateral below-knee amputation.  He presents today for right lower extremity arteriogram and intervention for an attempt at limb salvage.  Findings:  Aortogram showed no significant aortoiliac disease.  His right common femoral, profunda, and SFA were heavily calcified but all widely patent.  His above and below-knee popliteal artery was also widely patent.  He had single-vessel runoff via the peroneal artery with a collateral at the ankle that filled the dorsalis pedis.  His posterior tibial artery was diminutive proximally and then occluded in the mid calf with no evidence of distal reconstitution.  His anterior tibial artery was patent in the proximal to mid calf that had several segments of chronic total occlusion and then reconstituted in the foot as the dorsalis pedis but the dorsalis pedis was very small at less than 1 mm. After retrograde cannulation of his  anterior tibial artery at the ankle could not cross the chronic total occlusions of the anterior tibial either.  Ultimately elected to perform atherectomy of the peroneal artery since this was his only runoff using embolic protection filter distally.   Procedure:  The patient was identified in the holding area and taken to room 8.  The patient was then placed supine on the table and prepped and draped in the usual sterile fashion.  A time out was called.  Ultrasound was used to evaluate the left common femoral artery.  It was patent .  A digital ultrasound image was acquired.  A micropuncture needle was used to access the left common femoral artery under ultrasound guidance.  An 018 wire was advanced without resistance and a micropuncture sheath was placed.  The 018 wire was removed and a benson wire was placed.  The micropuncture sheath was exchanged for a 5 french sheath.  An omniflush catheter was advanced over the wire to the level of L-1.  An abdominal angiogram was obtained.  Next, using the omniflush catheter and a benson wire, the aortic bifurcation was crossed and the catheter was placed into theright external iliac artery and right runoff was obtained.   Ultimately my goal was to try and recanalize his anterior tibial artery antegrade.  Ultimately used a Rosen wire to [employer] for a long 6 [place] Ansell sheath in the left groin.  Patient was given 9000 units of IV heparin's and ACTs were checked.  Then used a V 18 wire with a 018 quick cross and was able to cannulate his anterior tibial.  Ultimately he had multiple chronic total occlusions that were long segment and after multiple  attempts including subintimal planes we could not get our wire to advance any farther and aborted antegrade attempts.  At that point in time the right foot was prepped and draped sterile fashion.  We then used ultrasound probe evaluated the dorsalis pedis artery in the foot which appeared to be too small to access.  As a  result I chose to access the anterior tibial at the ankle and used a short access needle with a retrograde tibial kit.  Ultimately we were successful to get in the artery twice with pulsatile backbleeding but we could get very limited purchase with our wire and finally we were able to get a sheath and that had pulsatile backbleeding.  We then used a V 18 and a CXI catheter in retrograde fashion to try and cross anterior tibial occlusion and again had very little success.  At one point it appeared we were outside the artery so we aborted. Given this this is his last attempt at limb salvage we elected to treat his tibioperoneal trunk and peroneal artery with atherectomy.  I pulled my wire and catheter out of the anterior tibial from the left groin sheath and then redirected down the peroneal artery into the foot.  At that point in time injection through the sheath confirmed that we were in the true lumen.  I then selected a bare filter wire that I exchanged in our CXI catheter and an NAV6 embolic protection device was placed in the distal peroneal artery.  Then used a 1.5 mm classic CSI atherectomy catheter and performed atherectomy of the tibioperoneal trunk and peroneal artery.  We ran this at slow medium and high speeds sequentially down the peroneal artery.  This was post ballooned with a 3 mm x 220 mm Sterling balloon.  We gave additional nitro at the completion of the case and then our Nav 6 filter device was removed and a final injection through the sheath showed a widely patent tibioperoneal trunk and peroneal artery with <30% residual stenosis.  Artery filled all the way down to the ankle were collateral filling the dorsalis pedis in the foot. At that point in time our sheath in the left groin was exchanged for a short 6 French sheath and a mynx closure device was deployed all the wires and catheters were removed.  I went down checked the right foot and patient had a brisk dorsalis pedis signal that was  much better than it was before with a weak monophasic posterior tibial signal.  Taken to PACU in stable condition.  [name], MD [facility] Office: [phone] Pager: [place]

## 2018-08-07 NOTE — Progress Notes (Signed)
Pharmacy Antibiotic Note  Anna Livers. is a 42 y.o. male admitted on 08/04/2018 with R-toe cellulitis/wound infection.  Pharmacy has been consulted for Vancomycin/Zosyn dosing.   The patient is ESRD-MWF however is dialyzing on Sun/Tues/Fri this week per the holiday schedule. The patient was noted to receive an extra HD on 12/21 with no Vancomycin maintenance dose given. The patient's estimated level currently is still expected to be therapeutic - so will hold off on checking a level. Next HD planned for 12/24, followed by 12/27 - doses adjusted to reflect this schedule.    Plan: - Vancomycin 1g post HD on Tues, 12/24 - Resume Vancomycin 1g/HD-MWF to start on 12/27 - Cont Zosyn 3.375G IV q12h to be infused over 4 hours - F/u ortho/VVS plans for intervention and/or amputation to address antibiotic LOT - Will continue to follow HD schedule/duration, culture results, LOT, and antibiotic de-escalation plans   Height: 6\' 2"  (188 cm) Weight: 182 lb 5.1 oz (82.7 kg) IBW/kg (Calculated) : 82.2  Temp (24hrs), Avg:98.5 F (36.9 C), Min:98 F (36.7 C), Max:99 F (37.2 C)  Recent Labs  Lab 08/04/18 2035 08/05/18 0416 08/06/18 0340 08/07/18 0506  WBC 8.8 10.8* 9.6 10.5  CREATININE 12.31* 12.85* 8.49* 7.14*  LATICACIDVEN 1.8  --   --   --     Estimated Creatinine Clearance: 15.8 mL/min (A) (by C-G formula based on SCr of 7.14 mg/dL (H)).    Allergies  Allergen Reactions  . Coconut Oil Anaphylaxis    Can use topically, allergic to coconut foods    Vanc 12/20 >> - Doses: 2g LD (1g + 1g - completed 12/21), 1g (12/22 post-HD) - HD: 12/21 (4 hr BFR 400, end 1500), 12/22 (4 hr BFR 400, end 1130) Zosyn 12/20 >>  12/20 BCx >> ngtd 12/21 MRSA PCR >> positive  Thank you for allowing pharmacy to be a part of this patient's care.  Alycia Rossetti, PharmD, BCPS Clinical Pharmacist Please check AMION for all [place] numbers 08/07/2018 8:46 AM

## 2018-08-07 NOTE — Anesthesia Postprocedure Evaluation (Signed)
Anesthesia Post Note  Patient: [name] Belluomini Sr.  Procedure(s) Performed: EVACUATION HEMATOMA (Right ) FOUR COMPARTMENT FASCIOTOMY OF RIGHT LOWER LEG (Right Leg Lower)     Patient location during evaluation: PACU Anesthesia Type: General Level of consciousness: awake Pain management: pain level controlled Vital Signs Assessment: post-procedure vital signs reviewed and stable Respiratory status: spontaneous breathing Cardiovascular status: stable Postop Assessment: no apparent nausea or vomiting Anesthetic complications: no    Last Vitals:  Vitals:   08/07/18 2213 08/07/18 2215  BP: (!) 170/91 (!) 173/82  Pulse: 92 88  Resp: 20 13  Temp:    SpO2: 100% 99%    Last Pain:  Vitals:   08/07/18 2215  TempSrc:   PainSc: (P) 5                  Delvina Mizzell

## 2018-08-07 NOTE — Progress Notes (Signed)
Inpatient Diabetes Program Recommendations  AACE/ADA: New Consensus Statement on Inpatient Glycemic Control (2015)  Target Ranges:  Prepandial:   less than 140 mg/dL      Peak postprandial:   less than 180 mg/dL (1-2 hours)      Critically ill patients:  140 - 180 mg/dL   Lab Results  Component Value Date   GLUCAP 167 (H) 08/07/2018   HGBA1C 7.8 (H) 02/07/2018    Review of Glycemic Control  Inpatient Diabetes Program Recommendations:   -Hgb A1c to determine current glycemia control  Thank you, Bethena Roys E. Haygen Zebrowski, RN, MSN, CDE  Diabetes Coordinator Inpatient Glycemic Control Team Team Pager [phone] (8am-5pm) 08/07/2018 10:38 AM

## 2018-08-07 NOTE — Op Note (Signed)
    OPERATIVE REPORT  DATE OF SURGERY: 08/07/2018  PATIENT: [name] Labella Sr., 42 y.o. male MRN: [id number]  DOB: [birth year]  PRE-OPERATIVE DIAGNOSIS: Hematoma right calf with possible compartment syndrome  POST-OPERATIVE DIAGNOSIS:  Same  PROCEDURE: 4 compartment fasciotomy, hematoma evacuation  SURGEON:  Curt Jews, M.D.  PHYSICIAN ASSISTANT: Nurse  ANESTHESIA: General  EBL: per anesthesia record  Total I/O In: 800 [I.V.:800] Out: 50 [Blood:50]  BLOOD ADMINISTERED: none  DRAINS: none  SPECIMEN: none  COUNTS CORRECT:  YES  PATIENT DISPOSITION:  PACU - hemodynamically stable  PROCEDURE DETAILS: The patient was taken to the operating placed supine position where the area of the right leg was prepped draped you sterile fashion.  Incision was made on the medial aspect of the calf and taken down through the fascia.  There was some hematoma at the level anterior to the gastroc anemias muscle.  The fascia was opened further distally towards the ankle through the same incision.  No active bleeding was encountered.  There was some hematoma at the more proximal towards the popliteal as well and this was evacuated.  Next a separate incision was made over the lateral calf between the anterior and posterior muscle bodies.  There was swelling and hematoma in the anterior compartment.  The fascia was opened further distally as well.  No active bleeding was encountered.  The skin was closed over the medial incision with skin staples.  The lateral skin was felt to be under tension with attempted closure and therefore was packed with a saline soaked gauze and Curlex wrap.  The patient tolerated procedure without immediate complication was transferred to the recovery room in stable condition   Rosetta Posner, M.D., [facility] 08/07/2018 9:36 PM

## 2018-08-07 NOTE — Consult Note (Signed)
Reason for Consult:Right 5th toe osteo Referring Physician: [name] is an 42 y.o. male.  HPI: [name] has been dealing with pain and skin changes to his right little toe for about 2 weeks. It continues to get worse and he came to the hospital for evaluation. MRI showed osteo in 4th and 5th toes as well as probably 5th MT; orthopedic surgery was consulted. He has had previous amputation by [name], says he just wants to go ahead with it this time as well. He is undergoing a vascular workup as well.  Past Medical History:  Diagnosis Date  . Anemia   . Atherosclerosis of lower extremity ([place])   . CAD (coronary artery disease)   . Chronic combined systolic and diastolic heart failure ([place])   . Complication of anesthesia   . ESRD (end stage renal disease) on dialysis [facility])    "MWF [employer]" (03/08/2017)  . GERD (gastroesophageal reflux disease)   . Heart murmur   . History of blood transfusion    "related to OR"  . Hypertension   . PONV (postoperative nausea and vomiting)   . Type II diabetes mellitus ([place])     Past Surgical History:  Procedure Laterality Date  . ABDOMINAL AORTOGRAM N/A 11/02/2016   Procedure: Abdominal Aortogram;  Surgeon: Waynetta Sandy, MD;  Location: [place] [facility];  Service: Cardiovascular;  Laterality: N/A;  . AMPUTATION Left 09/27/2013   Procedure: LEFT GREAT TOE AMPUTATION;  Surgeon: [name], MD;  Location: [place];  Service: Orthopedics;  Laterality: Left;  . AMPUTATION Right 08/15/2015   Procedure: Right Great Toe Amputation;  Surgeon: [name], MD;  Location: [place];  Service: Orthopedics;  Laterality: Right;  . AMPUTATION Left 11/05/2016   Procedure: TRANSMETATARSAL AMPUTATION LEFT FOOT;  Surgeon: [name], MD;  Location: [place];  Service: Orthopedics;  Laterality: Left;  . AMPUTATION Left 03/11/2017   Procedure: LEFT BELOW KNEE AMPUTATION;  Surgeon: [name], MD;  Location: [place];  Service:  Orthopedics;  Laterality: Left;  . AMPUTATION Right 03/11/2017   Procedure: RIGHT 2ND TOE AMPUTATION;  Surgeon: [name], MD;  Location: [place];  Service: Orthopedics;  Laterality: Right;  . AV FISTULA PLACEMENT  left arm  . CORONARY ARTERY BYPASS GRAFT N/A 11/04/2017   Procedure: CORONARY ARTERY BYPASS GRAFTING (CABG) x three, using left internal mammary artery and right    leg greater saphenous vein;  Surgeon: Melrose Nakayama, MD;  Location: [place];  Service: Open Heart Surgery;  Laterality: N/A;  . LEFT HEART CATH AND CORONARY ANGIOGRAPHY N/A 10/31/2017   Procedure: LEFT HEART CATH AND CORONARY ANGIOGRAPHY;  Surgeon: Troy Sine, MD;  Location: [place] [facility];  Service: Cardiovascular;  Laterality: N/A;  . LEFT HEART CATHETERIZATION WITH CORONARY ANGIOGRAM N/A 09/13/2014   Procedure: LEFT HEART CATHETERIZATION WITH CORONARY ANGIOGRAM;  Surgeon: Sinclair Grooms, MD;  Location: [facility] CATH LAB;  Service: Cardiovascular;  Laterality: N/A;  . LOWER EXTREMITY ANGIOGRAPHY Bilateral 11/02/2016   Procedure: Lower Extremity Angiography;  Surgeon: Waynetta Sandy, MD;  Location: [place] [facility];  Service: Cardiovascular;  Laterality: Bilateral;  . PERIPHERAL VASCULAR ATHERECTOMY Left 11/02/2016   Procedure: Peripheral Vascular Atherectomy;  Surgeon: Waynetta Sandy, MD;  Location: [place] [facility];  Service: Cardiovascular;  Laterality: Left;  PERONEAL  . STUMP REVISION Left 01/11/2017   Procedure: Revision Left Transmetatarsal Amputation;  Surgeon: [name], MD;  Location: [place];  Service: Orthopedics;  Laterality: Left;  . TEE WITHOUT CARDIOVERSION N/A 11/04/2017   Procedure: TRANSESOPHAGEAL ECHOCARDIOGRAM (TEE);  Surgeon: Melrose Nakayama, MD;  Location: [place];  Service: Open Heart Surgery;  Laterality: N/A;    Family History  Problem Relation Age of Onset  . Heart failure Mother   . Hypertension Mother     Social History:  reports that he has been  smoking cigarettes. He has a 13.00 pack-year smoking history. He has never used smokeless tobacco. He reports current alcohol use. He reports current drug use. Drug: Marijuana.  Allergies:  Allergies  Allergen Reactions  . Coconut Oil Anaphylaxis    Can use topically, allergic to coconut foods     Medications: I have reviewed the patient's current medications.  Results for orders placed or performed during the hospital encounter of 08/04/18 (from the past 48 hour(s))  Glucose, capillary     Status: Abnormal   Collection Time: 08/05/18  4:26 PM  Result Value Ref Range   Glucose-Capillary 120 (H) 70 - 99 mg/dL  Glucose, capillary     Status: Abnormal   Collection Time: 08/05/18  9:31 PM  Result Value Ref Range   Glucose-Capillary 297 (H) 70 - 99 mg/dL  CBC     Status: Abnormal   Collection Time: 08/06/18  3:40 AM  Result Value Ref Range   WBC 9.6 4.0 - 10.5 K/uL   RBC 4.56 4.22 - 5.81 MIL/uL   Hemoglobin 12.3 (L) 13.0 - 17.0 g/dL   HCT 38.8 (L) 39.0 - 52.0 %   MCV 85.1 80.0 - 100.0 fL   MCH 27.0 26.0 - 34.0 pg   MCHC 31.7 30.0 - 36.0 g/dL   RDW 14.6 11.5 - 15.5 %   Platelets 460 (H) 150 - 400 K/uL   nRBC 0.0 0.0 - 0.2 %    Comment: Performed at [facility], [address], [place], [place] [address]  Basic metabolic panel     Status: Abnormal   Collection Time: 08/06/18  3:40 AM  Result Value Ref Range   Sodium 131 (L) 135 - 145 mmol/L   Potassium 3.8 3.5 - 5.1 mmol/L   Chloride 91 (L) 98 - 111 mmol/L   CO2 24 22 - 32 mmol/L   Glucose, Bld 217 (H) 70 - 99 mg/dL   BUN 31 (H) 6 - 20 mg/dL   Creatinine, Ser 8.49 (H) 0.61 - 1.24 mg/dL    Comment: DELTA CHECK NOTED   Calcium 8.0 (L) 8.9 - 10.3 mg/dL   GFR calc non Af Amer 7 (L) >60 mL/min   GFR calc Af Amer 8 (L) >60 mL/min   Anion gap 16 (H) 5 - 15    Comment: Performed at [facility] [address], [place], [place] [address]  Glucose, capillary     Status: Abnormal   Collection Time: 08/06/18  7:22 AM   Result Value Ref Range   Glucose-Capillary 134 (H) 70 - 99 mg/dL  Glucose, capillary     Status: Abnormal   Collection Time: 08/06/18 12:50 PM  Result Value Ref Range   Glucose-Capillary 172 (H) 70 - 99 mg/dL  Glucose, capillary     Status: Abnormal   Collection Time: 08/06/18  4:32 PM  Result Value Ref Range   Glucose-Capillary 228 (H) 70 - 99 mg/dL  Glucose, capillary     Status: Abnormal   Collection Time: 08/06/18  8:54 PM  Result Value Ref Range  Glucose-Capillary 192 (H) 70 - 99 mg/dL  CBC     Status: Abnormal   Collection Time: 08/07/18  5:06 AM  Result Value Ref Range   WBC 10.5 4.0 - 10.5 K/uL   RBC 4.69 4.22 - 5.81 MIL/uL   Hemoglobin 13.2 13.0 - 17.0 g/dL   HCT 40.9 39.0 - 52.0 %   MCV 87.2 80.0 - 100.0 fL   MCH 28.1 26.0 - 34.0 pg   MCHC 32.3 30.0 - 36.0 g/dL   RDW 15.0 11.5 - 15.5 %   Platelets 483 (H) 150 - 400 K/uL   nRBC 0.0 0.0 - 0.2 %    Comment: Performed at [facility], [place] [address], [place], [place] [address]  Basic metabolic panel     Status: Abnormal   Collection Time: 08/07/18  5:06 AM  Result Value Ref Range   Sodium 130 (L) 135 - 145 mmol/L   Potassium 4.4 3.5 - 5.1 mmol/L   Chloride 92 (L) 98 - 111 mmol/L   CO2 25 22 - 32 mmol/L   Glucose, Bld 201 (H) 70 - 99 mg/dL   BUN 33 (H) 6 - 20 mg/dL   Creatinine, Ser 7.14 (H) 0.61 - 1.24 mg/dL   Calcium 8.7 (L) 8.9 - 10.3 mg/dL   GFR calc non Af Amer 9 (L) >60 mL/min   GFR calc Af Amer 10 (L) >60 mL/min   Anion gap 13 5 - 15    Comment: Performed at [facility] [address], [place], [place] [address]  Glucose, capillary     Status: Abnormal   Collection Time: 08/07/18  8:40 AM  Result Value Ref Range   Glucose-Capillary 167 (H) 70 - 99 mg/dL    Mr Foot Right Wo Contrast  Addendum Date: 08/06/2018   ADDENDUM REPORT: 08/06/2018 09:27 ADDENDUM: These results were called by telephone at the time of interpretation on 08/06/2018 at 9:23 am to Dr. Maylene Roes, who verbally acknowledged  these results. Electronically Signed   By: Van Clines M.D.   On: 08/06/2018 09:27   Result Date: 08/06/2018 CLINICAL DATA:  Right foot pain.  Dry gangrene of the small toe. EXAM: MRI OF THE RIGHT FOREFOOT WITHOUT CONTRAST TECHNIQUE: Multiplanar, multisequence MR imaging of the right forefoot was performed. No intravenous contrast was administered. COMPARISON:  Radiographs 08/04/2018 FINDINGS: Despite efforts by the technologist and patient, motion artifact is present on today's exam and could not be eliminated. This reduces exam sensitivity and specificity. Bones/Joint/Cartilage Prior amputation of first and second toes. Accentuated T2 and low T1 signal in the phalanges of the small toe favoring osteomyelitis. Small amount of edema along the medial aspect of the head of the fifth metatarsal could also reflect early osteomyelitis. Reduced T1 signal in the middle and distal phalanges of the fourth toe, suspicious for osteomyelitis. Ligaments Lisfranc ligament intact. Muscles and Tendons Low-level edema tracks within along the plantar musculature of the foot, notably the quadratus plantae a and flexor digitorum brevis, more likely neurogenic than from myositis. Generalized muscular atrophy. Expansion and some laxity flexor hallucis longus, which is likely discontinuous distally due to the amputation. Soft tissues Poor definition of the soft tissues along the dorsum of the fifth toe. IMPRESSION: 1. 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 Suspected involvement of the fourth toe is supplemental to the original preliminary report. Radiology assistant  personnel have been notified to put me in telephone contact with the referring physician or the referring physician's clinical representative in order to discuss these findings. Once this communication is established I will issue an  addendum to this report for documentation purposes. 2. Poor definition of the soft tissues of the dorsum of the fifth toe, potentially from either artifact or from actual erosions/absence of tissue. 3. Extensive motion artifact.  Poor fat saturation in the toes. Electronically Signed: By: Van Clines M.D. On: 08/06/2018 09:12    Review of Systems  Constitutional: Negative for weight loss.  HENT: Negative for ear discharge, ear pain, hearing loss and tinnitus.   Eyes: Negative for blurred vision, double vision, photophobia and pain.  Respiratory: Negative for cough, sputum production and shortness of breath.   Cardiovascular: Negative for chest pain.  Gastrointestinal: Negative for abdominal pain, nausea and vomiting.  Genitourinary: Negative for dysuria, flank pain, frequency and urgency.  Musculoskeletal: Positive for joint pain (Right little toe/foot). Negative for back pain, falls, myalgias and neck pain.  Neurological: Negative for dizziness, tingling, sensory change, focal weakness, loss of consciousness and headaches.  Endo/Heme/Allergies: Does not bruise/bleed easily.  Psychiatric/Behavioral: Negative for depression, memory loss and substance abuse. The patient is not nervous/anxious.    Blood pressure 139/70, pulse 93, temperature 99 F (37.2 C), temperature source Oral, resp. rate 18, height 6\' 2"  (1.88 m), weight 82.7 kg, SpO2 96 %. Physical Exam  Constitutional: He appears well-developed and well-nourished. No distress.  HENT:  Head: Normocephalic and atraumatic.  Eyes: Conjunctivae are normal. Right eye exhibits no discharge. Left eye exhibits no discharge. No scleral icterus.  Neck: Normal range of motion.  Cardiovascular: Normal rate and regular rhythm.  Respiratory: Effort normal. No respiratory distress.  Musculoskeletal:     Comments: RLE No traumatic wounds, ecchymosis, or rash  Right little toe necrotic, TTP, malodorous  No knee or ankle effusion  Knee stable  to varus/ valgus and anterior/posterior stress  Sens DPN, SPN, TN paresthetic  Motor EHL, ext, flex, evers grossly intact  DP 0, PT 0, No significant edema  Neurological: He is alert.  Skin: Skin is warm and dry. He is not diaphoretic.  Psychiatric: He has a normal mood and affect. His behavior is normal.    Assessment/Plan: Right foot osteo -- [name] is on vacation this week, will be back on the weekend and is happy to follow up. If [facility] feels he needs intervention sooner than that they should feel free to perform the amputation or contact me and I can arrange one of Dr. Jess Barters partners to perform.    Lisette Abu, PA-C Orthopedic Surgery [phone] 08/07/2018, 9:45 AM

## 2018-08-07 NOTE — Progress Notes (Signed)
Late entry; CBG 267 on arrival, novolog 5 unit sq given to Rt deltoid. BP  Was high, notified Dr. Nyoka Cowden, said whatever attending MD's order, use it. I gave Labetalol 10mg  @ 2210, Hydralazine 5mg  IV @ 2215 After 3 dose of fentanyl, got one more 74mcg Fentanyl order received, given @ 2235 BP is down  From 200 t0 170's. Transferred to 4E23 via bed.

## 2018-08-07 NOTE — Progress Notes (Signed)
[facility]  Subjective  - No acute events.  Right 5th toe still painful.     Objective 139/70 93 99 F (37.2 C) (Oral) 18 96%  Intake/Output Summary (Last 24 hours) at 08/07/2018 1033 Last data filed at 08/07/2018 0847 Gross per 24 hour  Intake 360 ml  Output 3000 ml  Net -2640 ml    Right DP/PT/peroneal signals R 5th toe dry gangrene  Laboratory Lab Results: Recent Labs    08/06/18 0340 08/07/18 0506  WBC 9.6 10.5  HGB 12.3* 13.2  HCT 38.8* 40.9  PLT 460* 483*   BMET Recent Labs    08/06/18 0340 08/07/18 0506  NA 131* 130*  K 3.8 4.4  CL 91* 92*  CO2 24 25  GLUCOSE 217* 201*  BUN 31* 33*  CREATININE 8.49* 7.14*  CALCIUM 8.0* 8.7*    COAG Lab Results  Component Value Date   INR 1.25 11/04/2017   INR 1.00 10/30/2017   INR 1.05 09/13/2014   No results found for: PTT  Assessment/Planning: Plan for right leg arteriogram this afternoon.  Suspect he has right tibial disease but will further evaluate.  [name] has performed all of his amputations and should be engaged for right 5th toe gangrene this week.  [name] 08/07/2018 10:33 AM --

## 2018-08-07 NOTE — Progress Notes (Addendum)
PROGRESS NOTE    [name] Schank Sr.  OZD:[id number] DOB: [birth year] DOA: 08/04/2018 PCP: Patient, [name] Per     Brief Narrative:  [name] a 42 y.o.malewithhistory of CAD status post CABG, ESRD on hemodialysis Monday Wednesday Friday, diabetes mellitus type 2, peripheral vascular disease status post amputation of multiple toes, hypertension presents to the ER because of increasing pain in the right foot with discoloration of his right foot small toe. Patient symptoms started 10 days ago which has been progressively worsening. MRI positive for osteomyelitis of 4th and 5th digits.   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 Orthopedic surgery consulted  Pain control Continue vanco/zosyn   PAD Vascular surgery planning arteriogram today  Continue aspirin, plavix  HLD Continue lipitor   HTN Continue coreg   ESRD  Nephrology following  DM Lantus, SSI  Mood disorder Continue lamictal, zoloft   Hyponatremia Mild, monitor BMP    DVT prophylaxis: subq hep Code Status: Full Family Communication: No family at bedside Disposition Plan: Pending surgical plan   Consultants:   Vascular surgery  Nephrology  Orthopedic surgery   Procedures:   None   Antimicrobials:  Anti-infectives (From admission, onward)   Start     Dose/Rate Route Frequency Ordered Stop   08/11/18 1200  vancomycin (VANCOCIN) IVPB 1000 mg/200  mL premix     1,000 mg 200 mL/hr over 60 Minutes Intravenous Every M-W-F (Hemodialysis) 08/07/18 0843     08/08/18 1200  vancomycin (VANCOCIN) IVPB 1000 mg/200 mL premix     1,000 mg 200 mL/hr over 60 Minutes Intravenous Every Tue (Hemodialysis) 08/07/18 0843 08/15/18 1159   08/07/18 1200  vancomycin (VANCOCIN) IVPB 1000 mg/200 mL premix  Status:  Discontinued     1,000 mg 200 mL/hr over 60 Minutes Intravenous Every M-W-F (Hemodialysis) 08/05/18 0349 08/07/18 0843   08/05/18 1000  piperacillin-tazobactam (ZOSYN) IVPB 3.375 g     3.375 g 12.5 mL/hr over 240 Minutes Intravenous Every 12 hours 08/05/18 0343     08/05/18 0345  vancomycin (VANCOCIN) IVPB 1000 mg/200 mL premix     1,000 mg 200 mL/hr over 60 Minutes Intravenous  Once 08/05/18 0340 08/05/18 0553   08/04/18 2030  piperacillin-tazobactam (ZOSYN) IVPB 3.375 g     3.375 g 100 mL/hr over 30 Minutes Intravenous  Once 08/04/18 2024 08/04/18 2247   08/04/18 2030  vancomycin (VANCOCIN) IVPB 1000 mg/200 mL premix     1,000 mg 200 mL/hr over 60 Minutes Intravenous  Once 08/04/18 2028 08/04/18 2353       Objective: Vitals:   08/06/18 2055 08/07/18 0500 08/07/18 0500 08/07/18 0841  BP: (!) 140/57  (!) 154/96 139/70  Pulse: 85  97 93  Resp: 18  19 18   Temp: 98 F (36.7 C)  98.1 F (36.7 C) 99 F (37.2 C)  TempSrc:    Oral  SpO2: 97%  98%  96%  Weight:  82.7 kg    Height:        Intake/Output Summary (Last 24 hours) at 08/07/2018 0846 Last data filed at 08/07/2018 0500 Gross per 24 hour  Intake 360 ml  Output 3000 ml  Net -2640 ml   Filed Weights   08/06/18 0500 08/06/18 0735 08/07/18 0500  Weight: 82.2 kg 82.7 kg 82.7 kg    Examination: General exam: Appears calm and comfortable  Respiratory system: Clear to auscultation. Respiratory effort normal. Cardiovascular system: S1 & S2 heard, RRR. No JVD, murmurs, rubs, gallops or clicks. No pedal edema. Gastrointestinal system: Abdomen is nondistended, soft and nontender.  No organomegaly or masses felt. Normal bowel sounds heard. Central nervous system: Alert and oriented. No focal neurological deficits. Psychiatry: Judgement and insight appear normal. Mood & affect appropriate.  Extremities: +left BKA, +right first 2 toes s/p amputation with gangrenous changes of 5th digit    Data Reviewed: I have personally reviewed following labs and imaging studies  CBC: Recent Labs  Lab 08/04/18 2035 08/05/18 0416 08/06/18 0340 08/07/18 0506  WBC 8.8 10.8* 9.6 10.5  NEUTROABS 6.1  --   --   --   HGB 13.4 11.7* 12.3* 13.2  HCT 42.6 37.6* 38.8* 40.9  MCV 86.4 86.0 85.1 87.2  PLT 423* 461* 460* 660*   Basic Metabolic Panel: Recent Labs  Lab 08/04/18 2035 08/05/18 0416 08/06/18 0340 08/07/18 0506  NA 134* 133* 131* 130*  K 4.2 4.3 3.8 4.4  CL 94* 95* 91* 92*  CO2 19* 20* 24 25  GLUCOSE 308* 227* 217* 201*  BUN 54* 59* 31* 33*  CREATININE 12.31* 12.85* 8.49* 7.14*  CALCIUM 8.7* 8.5* 8.0* 8.7*   GFR: Estimated Creatinine Clearance: 15.8 mL/min (A) (by C-G formula based on SCr of 7.14 mg/dL (H)). Liver Function Tests: Recent Labs  Lab 08/04/18 2035  AST 13*  ALT 12  ALKPHOS 54  BILITOT 0.7  PROT 8.0  ALBUMIN 2.8*   No results for input(s): LIPASE, AMYLASE in the last 168 hours. No results for input(s): AMMONIA in the last 168 hours. Coagulation Profile: No results for input(s): INR, PROTIME in the last 168 hours. Cardiac Enzymes: Recent Labs  Lab 08/04/18 2035  TROPONINI 0.03*   BNP (last 3 results) No results for input(s): PROBNP in the last 8760 hours. HbA1C: No results for input(s): HGBA1C in the last 72 hours. CBG: Recent Labs  Lab 08/06/18 0722 08/06/18 1250 08/06/18 1632 08/06/18 2054 08/07/18 0840  GLUCAP 134* 172* 228* 192* 167*   Lipid Profile: No results for input(s): CHOL, HDL, LDLCALC, TRIG, CHOLHDL, LDLDIRECT in the last 72 hours. Thyroid Function Tests: No results for input(s): TSH, T4TOTAL, FREET4, T3FREE,  THYROIDAB in the last 72 hours. Anemia Panel: No results for input(s): VITAMINB12, FOLATE, FERRITIN, TIBC, IRON, RETICCTPCT in the last 72 hours. Sepsis Labs: Recent Labs  Lab 08/04/18 2035  LATICACIDVEN 1.8    Recent Results (from the past 240 hour(s))  Blood culture (routine x 2)     Status: None (Preliminary result)   Collection Time: 08/04/18  8:35 PM  Result Value Ref Range Status   Specimen Description BLOOD RIGHT HAND  Final   Special Requests   Final    BOTTLES DRAWN AEROBIC AND ANAEROBIC Blood Culture adequate volume   Culture   Final    NO GROWTH 2 DAYS Performed at [facility], [place] [address], [place], [place] [address]    Report Status PENDING  Incomplete  Blood culture (routine x 2)     Status: None (Preliminary result)   Collection Time: 08/04/18  9:30 PM  Result Value Ref Range Status   Specimen Description BLOOD RIGHT HAND  Final   Special Requests   Final    BOTTLES DRAWN AEROBIC AND ANAEROBIC Blood Culture adequate volume   Culture   Final    NO GROWTH 2 DAYS Performed at [facility], [place] [address] [place], [place] [address]    Report Status PENDING  Incomplete  MRSA PCR Screening     Status: Abnormal   Collection Time: 08/05/18  2:08 AM  Result Value Ref Range Status   MRSA by PCR POSITIVE (A) NEGATIVE Final    Comment:        The GeneXpert MRSA Assay (FDA approved for NASAL specimens only), is one component of a comprehensive MRSA colonization surveillance program. It is not intended to diagnose MRSA infection nor to guide or monitor treatment for MRSA infections. RESULT CALLED TO, READ BACK BY AND VERIFIED WITHLendell Caprice RN [id number] 12/21/196 A BROWNING Performed at [facility], [place] [address] [place], [place] [address]        Radiology Studies: Mr Foot Right Wo Contrast  Addendum Date: 08/06/2018   ADDENDUM REPORT: 08/06/2018 09:27 ADDENDUM: These results were called by telephone at the time of interpretation on  08/06/2018 at 9:23 am to Dr. Maylene Roes, who verbally acknowledged these results. Electronically Signed   By: Van Clines M.D.   On: 08/06/2018 09:27   Result Date: 08/06/2018 CLINICAL DATA:  Right foot pain.  Dry gangrene of the small toe. EXAM: MRI OF THE RIGHT FOREFOOT WITHOUT CONTRAST TECHNIQUE: Multiplanar, multisequence MR imaging of the right forefoot was performed. No intravenous contrast was administered. COMPARISON:  Radiographs 08/04/2018 FINDINGS: Despite efforts by the technologist and patient, motion artifact is present on today's exam and could not be eliminated. This reduces exam sensitivity and specificity. Bones/Joint/Cartilage Prior amputation of first and second toes. Accentuated T2 and low T1 signal in the phalanges of the small toe favoring osteomyelitis. Small amount of edema along the medial aspect of the head of the fifth metatarsal could also reflect early osteomyelitis. Reduced T1 signal in the middle and distal phalanges of the fourth toe, suspicious for osteomyelitis. Ligaments Lisfranc ligament intact. Muscles and Tendons Low-level edema tracks within along the plantar musculature of the foot, notably the quadratus plantae a and flexor digitorum brevis, more likely neurogenic than from myositis. Generalized muscular atrophy. Expansion and some laxity flexor hallucis longus, which is likely discontinuous distally due to the amputation. Soft tissues Poor definition of the soft tissues along the dorsum of the fifth toe. IMPRESSION: 1. 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 Suspected involvement of the fourth toe is supplemental to the original preliminary report. Radiology assistant personnel have been notified to put me in telephone contact with the referring physician or the referring physician's clinical representative in order to discuss these  findings. Once this communication is established I will issue an addendum to this report for documentation purposes. 2. Poor definition of the soft tissues of the dorsum of the fifth toe, potentially from either artifact or from actual erosions/absence of tissue. 3. Extensive motion artifact.  Poor fat saturation in the toes. Electronically Signed: By: Van Clines M.D. On: 08/06/2018 09:12   Vas [place] Burnard Bunting  With/wo Tbi  Result Date: 08/05/2018 LOWER EXTREMITY DOPPLER STUDY Indications: Gangrene. High Risk Factors: Hypertension, Diabetes, current smoker. Other Factors: ESRD on dialysis, left access.  Vascular Interventions: Multiple toe amputations on the right. 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 Comparison Study: Prior study from 11/01/17 is available for comparison Performing Technologist: Sharion Dove RVS  Examination Guidelines: A complete evaluation includes at minimum, Doppler waveform signals and systolic blood pressure reading at the level of bilateral brachial, anterior tibial, and posterior tibial arteries, when vessel segments are accessible. Bilateral testing is considered an integral part of a complete examination. Photoelectric Plethysmograph (PPG) waveforms and toe systolic pressure readings are included as required and additional duplex testing as needed. Limited examinations for reoccurring indications may be performed as noted.  ABI Findings: +---------+------------------+-----+----------+-------------------+ Right    Rt Pressure (mmHg)IndexWaveform  Comment             +---------+------------------+-----+----------+-------------------+ Brachial 255                    biphasic  Blood pressure >255 +---------+------------------+-----+----------+-------------------+ PTA      254               1.00 monophasicNon comp            +---------+------------------+-----+----------+-------------------+ DP       213               0.84 monophasic                     +---------+------------------+-----+----------+-------------------+ Great Toe                                 amputation          +---------+------------------+-----+----------+-------------------+ +--------+------------------+-----+--------+-------+ Left    Lt Pressure (mmHg)IndexWaveformComment +--------+------------------+-----+--------+-------+ Brachial                               access  +--------+------------------+-----+--------+-------+ PTA                                    BKA     +--------+------------------+-----+--------+-------+ DP                                     BKA     +--------+------------------+-----+--------+-------+ +-------+-----------+-----------+------------+------------+ ABI/TBIToday's ABIToday's TBIPrevious ABIPrevious TBI +-------+-----------+-----------+------------+------------+ Right  non comp   Toe amp    non comp    toe amp      +-------+-----------+-----------+------------+------------+ Left   BKA                                            +-------+-----------+-----------+------------+------------+ Right ABIs appear essentially unchanged.  Summary: Right: Resting right ankle-brachial index indicates noncompressible right lower extremity arteries.  *See table(s) above for measurements and observations.  Electronically signed by Monica Martinez MD on 08/05/2018 at 11:01:37 AM.   Final       Scheduled Meds: . aspirin EC  81 mg Oral Daily  . atorvastatin  80 mg Oral q1800  . calcitRIOL  1.25 mcg Oral Q M,W,F-HD  . carvedilol  6.25 mg Oral Daily  . Chlorhexidine Gluconate Cloth  6 each Topical Q0600  . clopidogrel  75 mg Oral Daily  . heparin  5,000 Units Subcutaneous Q8H  . insulin aspart  0-9 Units Subcutaneous TID WC  . insulin glargine  6 Units Subcutaneous QHS  . lamoTRIgine  150 mg Oral QHS  . lanthanum  2,000 mg Oral TID WC  . sertraline  50 mg Oral Daily   Continuous Infusions: . sodium chloride 250 mL  (08/05/18 2244)  . piperacillin-tazobactam (ZOSYN)  IV 3.375 g (08/06/18 2311)  . [START ON 08/08/2018] vancomycin    . [START ON 08/11/2018] vancomycin       LOS: 2 days    Time spent: 25 minutes   Dessa Phi, DO [facility] [email or web address] Password [facility] 08/07/2018, 8:46 AM

## 2018-08-07 NOTE — Anesthesia Preprocedure Evaluation (Signed)
Anesthesia Evaluation  Patient identified by MRN, date of birth, ID band Patient awake  General Assessment Comment:Emergent surgery for Dr. Angela Cox, MD  Reviewed: Allergy & Precautions, NPO status , Patient's Chart, lab work & pertinent test results  History of Anesthesia Complications (+) PONV  Airway Mallampati: II  TM Distance: >3 FB     Dental   Pulmonary Current Smoker,    breath sounds clear to auscultation       Cardiovascular hypertension, + angina + CAD, + Peripheral Vascular Disease and +CHF   Rhythm:Regular Rate:Normal     Neuro/Psych    GI/Hepatic Neg liver ROS, GERD  ,  Endo/Other  diabetes  Renal/GU Renal disease     Musculoskeletal   Abdominal   Peds  Hematology   Anesthesia Other Findings   Reproductive/Obstetrics                             Anesthesia Physical Anesthesia Plan  ASA: IV  Anesthesia Plan: General   Post-op Pain Management:    Induction: Intravenous, Rapid sequence and Cricoid pressure planned  PONV Risk Score and Plan: 2 and Treatment may vary due to age or medical condition and Midazolam  Airway Management Planned: Oral ETT  Additional Equipment:   Intra-op Plan:   Post-operative Plan: Possible Post-op intubation/ventilation  Informed Consent: I have reviewed the patients History and Physical, chart, labs and discussed the procedure including the risks, benefits and alternatives for the proposed anesthesia with the patient or authorized representative who has indicated his/her understanding and acceptance.   Dental advisory given  Plan Discussed with: CRNA, Anesthesiologist and Surgeon  Anesthesia Plan Comments:         Anesthesia Quick Evaluation

## 2018-08-07 NOTE — Progress Notes (Signed)
Patient to 4E room 23 at this time. Telemetry applied and CCMD notified. V/s and assessment done. Patient oriented to room and how to call nurse with any needs.

## 2018-08-07 NOTE — Progress Notes (Signed)
Patient ID: [name] Pignato Sr., male   DOB: 09/18/1975, 42 y.o.   MRN: [id number] Called to see patient with progressive pain and tightness in his right calf.  Reports that this is very severe and feels like a cramp in his calf.  His right foot is warm.  He does have dampened signal at the posterior tibial and dorsalis pedis level.  Is very firm in his calf extending up into his popliteal space and is tender over this area as well.  Suspect some extravasation from his atherectomy.  Discussed options with the patient.  Is very uncomfortable with this.  Have recommended exploration and evacuation of hematoma and possible repair of his artery.  Explained that he does not really have any options for surgical bypass.  Will take immediately to the operating room this evening.  Had a few bites of his dinner earlier but cannot wait due to the acute nature of this change.

## 2018-08-07 NOTE — Progress Notes (Addendum)
Paged Dr. Donnetta Hutching via Amion at this time. Patient noticed about an hour ago that pain was more in right calf and that calf was hard with oozing on to dressing on foot. Patient did not notify nurse as he states, "he thought it would subside". PT pulse dopplerable, but not DP.bp 185/44 heart rage 87. Pain 10/10 report by pt. Tibal dressing saturated with bright red blood.

## 2018-08-07 NOTE — Transfer of Care (Signed)
Immediate Anesthesia Transfer of Care Note  Patient: [name] Dozal Sr.  Procedure(s) Performed: EVACUATION HEMATOMA (Right ) FOUR COMPARTMENT FASCIOTOMY OF RIGHT LOWER LEG (Right Leg Lower)  Patient Location: PACU  Anesthesia Type:General  Level of Consciousness: awake  Airway & Oxygen Therapy: Patient Spontanous Breathing  Post-op Assessment: Report given to RN and Post -op Vital signs reviewed and stable  Post vital signs: Reviewed and stable  Last Vitals:  Vitals Value Taken Time  BP    Temp    Pulse    Resp    SpO2      Last Pain:  Vitals:   08/07/18 1833  TempSrc: Oral  PainSc:       Patients Stated Pain Goal: 0 (59/74/16 3845)  Complications: No apparent anesthesia complications

## 2018-08-08 ENCOUNTER — Encounter (HOSPITAL_COMMUNITY): Payer: Self-pay | Admitting: Vascular Surgery

## 2018-08-08 LAB — GLUCOSE, CAPILLARY
GLUCOSE-CAPILLARY: 223 mg/dL — AB (ref 70–99)
Glucose-Capillary: 177 mg/dL — ABNORMAL HIGH (ref 70–99)
Glucose-Capillary: 160 mg/dL — ABNORMAL HIGH (ref 70–99)
Glucose-Capillary: 225 mg/dL — ABNORMAL HIGH (ref 70–99)
Glucose-Capillary: 236 mg/dL — ABNORMAL HIGH (ref 70–99)

## 2018-08-08 LAB — BASIC METABOLIC PANEL
Anion gap: 21 — ABNORMAL HIGH (ref 5–15)
BUN: 44 mg/dL — ABNORMAL HIGH (ref 6–20)
CO2: 20 mmol/L — ABNORMAL LOW (ref 22–32)
Calcium: 8.6 mg/dL — ABNORMAL LOW (ref 8.9–10.3)
Chloride: 93 mmol/L — ABNORMAL LOW (ref 98–111)
Creatinine, Ser: 9.78 mg/dL — ABNORMAL HIGH (ref 0.61–1.24)
GFR calc Af Amer: 7 mL/min — ABNORMAL LOW (ref >60)
GFR calc non Af Amer: 6 mL/min — ABNORMAL LOW (ref >60)
GLUCOSE: 200 mg/dL — AB (ref 70–99)
Potassium: 4.2 mmol/L (ref 3.5–5.1)
Sodium: 134 mmol/L — ABNORMAL LOW (ref 135–145)

## 2018-08-08 LAB — CBC
HCT: 30.8 % — ABNORMAL LOW (ref 39.0–52.0)
Hemoglobin: 10.1 g/dL — ABNORMAL LOW (ref 13.0–17.0)
MCH: 27.7 pg (ref 26.0–34.0)
MCHC: 32.8 g/dL (ref 30.0–36.0)
MCV: 84.6 fL (ref 80.0–100.0)
Platelets: 478 10*3/uL — ABNORMAL HIGH (ref 150–400)
RBC: 3.64 MIL/uL — ABNORMAL LOW (ref 4.22–5.81)
RDW: 14.6 % (ref 11.5–15.5)
WBC: 16.1 10*3/uL — ABNORMAL HIGH (ref 4.0–10.5)
nRBC: 0 % (ref 0.0–0.2)

## 2018-08-08 MED ORDER — PENTAFLUOROPROP-TETRAFLUOROETH EX AERO: 1.0000 "application " | INHALATION_SPRAY | CUTANEOUS | Status: DC | PRN

## 2018-08-08 MED ORDER — CALCITRIOL 0.5 MCG PO CAPS
ORAL_CAPSULE | ORAL | Status: AC
  Filled 2018-08-08: qty 2

## 2018-08-08 MED ORDER — ALTEPLASE 2 MG IJ SOLR: 2.0000 mg | Freq: Once | INTRAMUSCULAR | Status: DC | PRN

## 2018-08-08 MED ORDER — HEPARIN SODIUM (PORCINE) 1000 UNIT/ML DIALYSIS
1000.0000 [IU] | INTRAMUSCULAR | Status: DC | PRN
  Filled 2018-08-08: qty 1

## 2018-08-08 MED ORDER — SODIUM CHLORIDE 0.9 % IV SOLN: 100.0000 mL | INTRAVENOUS | Status: DC | PRN

## 2018-08-08 MED ORDER — HYDROMORPHONE HCL 1 MG/ML IJ SOLN
INTRAMUSCULAR | Status: AC
  Administered 2018-08-08: 1 mg via INTRAVENOUS
  Filled 2018-08-08: qty 1

## 2018-08-08 MED ORDER — LIDOCAINE HCL (PF) 1 % IJ SOLN: 5.0000 mL | INTRAMUSCULAR | Status: DC | PRN

## 2018-08-08 MED ORDER — CALCITRIOL 0.25 MCG PO CAPS
ORAL_CAPSULE | ORAL | Status: AC
  Filled 2018-08-08: qty 1

## 2018-08-08 MED ORDER — LIDOCAINE-PRILOCAINE 2.5-2.5 % EX CREA
1.0000 "application " | TOPICAL_CREAM | CUTANEOUS | Status: DC | PRN
  Filled 2018-08-08: qty 5

## 2018-08-08 NOTE — Progress Notes (Signed)
PROGRESS NOTE    [name] Flanagan Sr.  ZCH:[id number] DOB: [birth year] DOA: 08/04/2018 PCP: Patient, [name] Per     Brief Narrative:  [name] a 42 y.o.malewithhistory of CAD status post CABG, ESRD on hemodialysis Monday Wednesday Friday, diabetes mellitus type 2, peripheral vascular disease status post amputation of multiple toes, hypertension presents to the ER because of increasing pain in the right foot with discoloration of his right foot small toe. Patient symptoms started 10 days ago which has been progressively worsening. MRI positive for osteomyelitis of 4th and 5th digits.   New events last 24 hours / Subjective: Underwent urgent fasciotomy and hematoma evacuation last night. Doing well this morning.   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 Orthopedic surgery consulted Pain control Continue vanco/zosyn  Blood cultures negative to date  Vascular surgery planning for right 5th toe vs transmetatarsal amputation on Thursday    PAD S/p RLE arteriogram with peroneal atherectomy with subsequent calf hematoma evacuation and 4 compartment fasciotomy 12/23  Continue aspirin, plavix  HLD Continue lipitor   HTN Continue coreg   ESRD  Nephrology following  DM Lantus, SSI  Mood disorder Continue lamictal, zoloft   Hyponatremia Mild, monitor BMP    DVT prophylaxis: subq hep Code Status: Full Family Communication: No family at bedside Disposition Plan: Pending surgery 12/26   Consultants:    Vascular surgery  Nephrology  Orthopedic surgery   Procedures:   S/p RLE arteriogram with peroneal atherectomy with subsequent calf hematoma evac and 4 compartment fasciotomy Dr. Donnetta Hutching   Antimicrobials:  Anti-infectives (From admission, onward)   Start     Dose/Rate Route Frequency Ordered Stop   08/11/18 1200  vancomycin (VANCOCIN) IVPB 1000 mg/200 mL premix     1,000 mg 200 mL/hr over 60 Minutes Intravenous Every M-W-F (Hemodialysis) 08/07/18 0843     08/08/18 1200  vancomycin (VANCOCIN) IVPB 1000 mg/200 mL premix     1,000 mg 200 mL/hr over 60 Minutes Intravenous Every Tue (Hemodialysis) 08/07/18 0843 08/15/18 1159   08/07/18 1200  vancomycin (VANCOCIN) IVPB 1000 mg/200 mL premix  Status:  Discontinued     1,000 mg 200 mL/hr over 60 Minutes Intravenous Every M-W-F (Hemodialysis) 08/05/18 0349 08/07/18 0843   08/05/18 1000  piperacillin-tazobactam (ZOSYN) IVPB 3.375 g     3.375 g 12.5 mL/hr over 240 Minutes Intravenous Every 12 hours 08/05/18 0343     08/05/18 0345  vancomycin (VANCOCIN) IVPB 1000 mg/200 mL premix     1,000 mg 200 mL/hr over 60 Minutes Intravenous  Once 08/05/18 0340 08/05/18 0553   08/04/18 2030  piperacillin-tazobactam (ZOSYN) IVPB 3.375 g     3.375 g 100 mL/hr over 30 Minutes Intravenous  Once 08/04/18 2024 08/04/18 2247   08/04/18 2030  vancomycin (VANCOCIN) IVPB 1000 mg/200 mL premix     1,000 mg 200 mL/hr over 60 Minutes Intravenous  Once 08/04/18 2028 08/04/18 2353       Objective: Vitals:   08/07/18 2300 08/08/18 0015  08/08/18 0100 08/08/18 0439  BP:    (!) 144/64  Pulse:   (!) 102 (!) 106  Resp: 16   (!) 22  Temp:  99.6 F (37.6 C) 99.6 F (37.6 C) 99.6 F (37.6 C)  TempSrc:    Oral  SpO2: 95% 96%  100%  Weight:    85.8 kg  Height:        Intake/Output Summary (Last 24 hours) at 08/08/2018 1137 Last data filed at 08/07/2018 2122 Gross per 24 hour  Intake 800 ml  Output 50 ml  Net 750 ml   Filed Weights   08/06/18 0735  08/07/18 0500 08/08/18 0439  Weight: 82.7 kg 82.7 kg 85.8 kg    Examination: General exam: Appears calm and comfortable  Respiratory system: Clear to auscultation. Respiratory effort normal. Cardiovascular system: S1 & S2 heard, RRR. No JVD, murmurs, rubs, gallops or clicks. No pedal edema. Gastrointestinal system: Abdomen is nondistended, soft and nontender. No organomegaly or masses felt. Normal bowel sounds heard. Central nervous system: Alert and oriented. No focal neurological deficits. Extremities: +left BKA, +right calf wrapped in dressing, +right 5th toe with gangrene  Psychiatry: Judgement and insight appear normal. Mood & affect appropriate.    Data Reviewed: I have personally reviewed following labs and imaging studies  CBC: Recent Labs  Lab 08/04/18 2035 08/05/18 0416 08/06/18 0340 08/07/18 0506 08/08/18 0251  WBC 8.8 10.8* 9.6 10.5 16.1*  NEUTROABS 6.1  --   --   --   --   HGB 13.4 11.7* 12.3* 13.2 10.1*  HCT 42.6 37.6* 38.8* 40.9 30.8*  MCV 86.4 86.0 85.1 87.2 84.6  PLT 423* 461* 460* 483* 536*   Basic Metabolic Panel: Recent Labs  Lab 08/04/18 2035 08/05/18 0416 08/06/18 0340 08/07/18 0506 08/08/18 0251  NA 134* 133* 131* 130* 134*  K 4.2 4.3 3.8 4.4 4.2  CL 94* 95* 91* 92* 93*  CO2 19* 20* 24 25 20*  GLUCOSE 308* 227* 217* 201* 200*  BUN 54* 59* 31* 33* 44*  CREATININE 12.31* 12.85* 8.49* 7.14* 9.78*  CALCIUM 8.7* 8.5* 8.0* 8.7* 8.6*   GFR: Estimated Creatinine Clearance: 11.6 mL/min (A) (by C-G formula based on SCr of 9.78 mg/dL (H)). Liver Function Tests: Recent Labs  Lab 08/04/18 2035  AST 13*  ALT 12  ALKPHOS 54  BILITOT 0.7  PROT 8.0  ALBUMIN 2.8*   No results for input(s): LIPASE, AMYLASE in the last 168 hours. No results for input(s): AMMONIA in the last 168 hours. Coagulation Profile: No results for input(s): INR, PROTIME in the last 168 hours. Cardiac Enzymes: Recent Labs  Lab 08/04/18 2035  TROPONINI 0.03*   BNP (last 3  results) No results for input(s): PROBNP in the last 8760 hours. HbA1C: No results for input(s): HGBA1C in the last 72 hours. CBG: Recent Labs  Lab 08/07/18 2007 08/07/18 2151 08/07/18 2257 08/08/18 0619 08/08/18 1109  GLUCAP 258* 267* 235* 223* 177*   Lipid Profile: No results for input(s): CHOL, HDL, LDLCALC, TRIG, CHOLHDL, LDLDIRECT in the last 72 hours. Thyroid Function Tests: No results for input(s): TSH, T4TOTAL, FREET4, T3FREE, THYROIDAB in the last 72 hours. Anemia Panel: No results for input(s): VITAMINB12, FOLATE, FERRITIN, TIBC, IRON, RETICCTPCT in the last 72 hours. Sepsis Labs: Recent Labs  Lab 08/04/18 2035  LATICACIDVEN 1.8    Recent Results (from the past 240 hour(s))  Blood culture (routine x 2)     Status: None (Preliminary result)   Collection Time: 08/04/18  8:35  PM  Result Value Ref Range Status   Specimen Description BLOOD RIGHT HAND  Final   Special Requests   Final    BOTTLES DRAWN AEROBIC AND ANAEROBIC Blood Culture adequate volume   Culture   Final    NO GROWTH 4 DAYS Performed at [facility], [address] [place], [place] [address]    Report Status PENDING  Incomplete  Blood culture (routine x 2)     Status: None (Preliminary result)   Collection Time: 08/04/18  9:30 PM  Result Value Ref Range Status   Specimen Description BLOOD RIGHT HAND  Final   Special Requests   Final    BOTTLES DRAWN AEROBIC AND ANAEROBIC Blood Culture adequate volume   Culture   Final    NO GROWTH 4 DAYS Performed at [facility], [place] [address], [place], [place] [address]    Report Status PENDING  Incomplete  MRSA PCR Screening     Status: Abnormal   Collection Time: 08/05/18  2:08 AM  Result Value Ref Range Status   MRSA by PCR POSITIVE (A) NEGATIVE Final    Comment:        The GeneXpert MRSA Assay (FDA approved for NASAL specimens only), is one component of a comprehensive MRSA colonization surveillance program. It is not intended to  diagnose MRSA infection nor to guide or monitor treatment for MRSA infections. RESULT CALLED TO, READ BACK BY AND VERIFIED WITHLendell Caprice RN [id number] 12/21/196 A BROWNING Performed at [facility], [place] [address] [place], [place] [address]        Radiology Studies: No results found.    Scheduled Meds: . aspirin EC  81 mg Oral Daily  . atorvastatin  80 mg Oral q1800  . calcitRIOL  1.25 mcg Oral Q M,W,F-HD  . carvedilol  6.25 mg Oral Daily  . Chlorhexidine Gluconate Cloth  6 each Topical Q0600  . clopidogrel  75 mg Oral Daily  . heparin  5,000 Units Subcutaneous Q8H  . insulin aspart  0-9 Units Subcutaneous TID WC  . insulin glargine  6 Units Subcutaneous QHS  . lamoTRIgine  150 mg Oral QHS  . lanthanum  2,000 mg Oral TID WC  . mupirocin ointment  1 application Nasal BID  . sertraline  50 mg Oral Daily  . sodium chloride flush  3 mL Intravenous Q12H   Continuous Infusions: . sodium chloride 250 mL (08/05/18 2244)  . sodium chloride    . piperacillin-tazobactam (ZOSYN)  IV 3.375 g (08/08/18 0844)  . vancomycin    . [START ON 08/11/2018] vancomycin       LOS: 3 days    Time spent: 20 minutes   Dessa Phi, DO [facility] [email or web address] Password [facility] 08/08/2018, 11:37 AM

## 2018-08-08 NOTE — Progress Notes (Addendum)
[facility] Progress Note   Subjective:   Underwent  R LE arteriogram with atherectomy/angioplasty yesterday then required urgent OR intervention last night. S/p 4 compartment fasciotomy and hematoma evacuation of R calf last night. Pain controlled.  Biggest c/o being hungry. Diet added.   Objective Vitals:   08/07/18 2300 08/08/18 0015 08/08/18 0100 08/08/18 0439  BP:    (!) 144/64  Pulse:   (!) 102 (!) 106  Resp: 16   (!) 22  Temp:  99.6 F (37.6 C) 99.6 F (37.6 C) 99.6 F (37.6 C)  TempSrc:    Oral  SpO2: 95% 96%  100%  Weight:    85.8 kg  Height:       Physical Exam General: Well appearing man, NAD Heart: RRR; no murmur Lungs: CTAB Extremities: L BKA without stump edema. RLE without edema, s/p prior amputation 1st/2nd toes. 5th toe dry necrotic/gangrene appearance. R calf bandaged.  Dialysis Access: L AVF + bruit  Additional Objective Labs: Basic Metabolic Panel: Recent Labs  Lab 08/06/18 0340 08/07/18 0506 08/08/18 0251  NA 131* 130* 134*  K 3.8 4.4 4.2  CL 91* 92* 93*  CO2 24 25 20*  GLUCOSE 217* 201* 200*  BUN 31* 33* 44*  CREATININE 8.49* 7.14* 9.78*  CALCIUM 8.0* 8.7* 8.6*   Liver Function Tests: Recent Labs  Lab 08/04/18 2035  AST 13*  ALT 12  ALKPHOS 54  BILITOT 0.7  PROT 8.0  ALBUMIN 2.8*   CBC: Recent Labs  Lab 08/04/18 2035 08/05/18 0416 08/06/18 0340 08/07/18 0506 08/08/18 0251  WBC 8.8 10.8* 9.6 10.5 16.1*  NEUTROABS 6.1  --   --   --   --   HGB 13.4 11.7* 12.3* 13.2 10.1*  HCT 42.6 37.6* 38.8* 40.9 30.8*  MCV 86.4 86.0 85.1 87.2 84.6  PLT 423* 461* 460* 483* 478*   Blood Culture    Component Value Date/Time   SDES BLOOD RIGHT HAND 08/04/2018 2130   SPECREQUEST  08/04/2018 2130    BOTTLES DRAWN AEROBIC AND ANAEROBIC Blood Culture adequate volume   CULT  08/04/2018 2130    NO GROWTH 4 DAYS Performed at [facility], [place] [address] [place], [place] [address]    REPTSTATUS PENDING 08/04/2018 2130    Cardiac Enzymes: Recent Labs  Lab 08/04/18 2035  TROPONINI 0.03*   CBG: Recent Labs  Lab 08/07/18 1600 08/07/18 2007 08/07/18 2151 08/07/18 2257 08/08/18 0619  GLUCAP 145* 258* 267* 235* 223*   Studies/Results:  Medications: . sodium chloride 250 mL (08/05/18 2244)  . sodium chloride    . piperacillin-tazobactam (ZOSYN)  IV 3.375 g (08/08/18 0844)  . vancomycin    . [START ON 08/11/2018] vancomycin     . aspirin EC  81 mg Oral Daily  . atorvastatin  80 mg Oral q1800  . calcitRIOL  1.25 mcg Oral Q M,W,F-HD  . carvedilol  6.25 mg Oral Daily  . Chlorhexidine Gluconate Cloth  6 each Topical Q0600  . clopidogrel  75 mg Oral Daily  . fentaNYL      . heparin  5,000 Units Subcutaneous Q8H  . hydrALAZINE      . insulin aspart      . insulin aspart  0-9 Units Subcutaneous TID WC  . insulin glargine  6 Units Subcutaneous QHS  . labetalol      . lamoTRIgine  150 mg Oral QHS  . lanthanum  2,000 mg Oral TID WC  . mupirocin ointment  1 application  Nasal BID  . sertraline  50 mg Oral Daily  . sodium chloride flush  3 mL Intravenous Q12H    Dialysis Orders: MWF at [facility] 4hr, 200dialyzer, 500/800, EDW 80kg, 2K/2Ca, UFP #2, AVF, Heparin 2400 bolus - Calcitriol 1.32mcg PO q HD - No ESA (Last Hgb 14)  Assessment/Plan: 1.  R 5th toe gangrene: MRI suggests osteo involving 4th an 5th toes.On Vanc/Zosyn. BC Neg. Underwent RLE arteriogram with atherectomy/angioplasty 12/23 per Dr. Carlis Abbott then went back to OR that night for fasciotomies/hematoma evacuation of R calf.  VVS following for fasciotomy closure/amputation 12/26.  2.  ESRD:  Following /HD holiday schedule (usual MWF = SuTuF). HD today per holiday schedule.  3.  Hypertension/volume: BP improving, UF as tolerated. Net UF 3L on 12/22.  4.  Anemia: Hgb 13>10.1. Follow. No ESA for now. 5.  Metabolic bone disease: Ca ok, Phos pending here (usually high). Continue Fosrenol as binder. 6.  CAD (Hx CABG) 7.  Type 2 DM: Per  primary  Lynnda Child PA-C [facility] Pager [phone] 08/08/2018,9:43 AM  I have seen and examined this patient and agree with plan and assessment in the above note with renal recommendations/intervention highlighted.  He was seen while on HD and c/o right leg pain.  BP stable and asking for dilaudid.  Appreciate VVS' care.  Governor Rooks Chatara Lucente,MD 08/08/2018 12:37 PM

## 2018-08-08 NOTE — Progress Notes (Addendum)
  Progress Note    08/08/2018 8:18 AM 1 Day Post-Op  Subjective:  R calf feels much better after fasciotomies and hematoma evacuation last night.   Vitals:   08/08/18 0100 08/08/18 0439  BP:  (!) 144/64  Pulse: (!) 102 (!) 106  Resp:  (!) 22  Temp: 99.6 F (37.6 C) 99.6 F (37.6 C)  SpO2:  100%   Physical Exam: Lungs:  Non labored Incisions:  Dressing left in place Extremities:  R DP signal by doppler Abdomen:  Soft Neurologic: A&O  CBC    Component Value Date/Time   WBC 16.1 (H) 08/08/2018 0251   RBC 3.64 (L) 08/08/2018 0251   HGB 10.1 (L) 08/08/2018 0251   HGB 14.3 02/07/2018 0937   HCT 30.8 (L) 08/08/2018 0251   HCT 42.8 02/07/2018 0937   PLT 478 (H) 08/08/2018 0251   PLT 369 02/07/2018 0937   MCV 84.6 08/08/2018 0251   MCV 82 02/07/2018 0937   MCH 27.7 08/08/2018 0251   MCHC 32.8 08/08/2018 0251   RDW 14.6 08/08/2018 0251   RDW 21.5 (H) 02/07/2018 0937   LYMPHSABS 1.7 08/04/2018 2035   MONOABS 0.7 08/04/2018 2035   EOSABS 0.2 08/04/2018 2035   BASOSABS 0.1 08/04/2018 2035    BMET    Component Value Date/Time   NA 134 (L) 08/08/2018 0251   NA 133 (L) 02/07/2018 0937   K 4.2 08/08/2018 0251   CL 93 (L) 08/08/2018 0251   CO2 20 (L) 08/08/2018 0251   GLUCOSE 200 (H) 08/08/2018 0251   GLUCOSE 262 03/06/2008   BUN 44 (H) 08/08/2018 0251   BUN 37 (H) 02/07/2018 0937   CREATININE 9.78 (H) 08/08/2018 0251   CALCIUM 8.6 (L) 08/08/2018 0251   GFRNONAA 6 (L) 08/08/2018 0251   GFRAA 7 (L) 08/08/2018 0251    INR    Component Value Date/Time   INR 1.25 11/04/2017 1450     Intake/Output Summary (Last 24 hours) at 08/08/2018 0818 Last data filed at 08/07/2018 2122 Gross per 24 hour  Intake 800 ml  Output 50 ml  Net 750 ml     Assessment/Plan:  42 y.o. male is s/p RLE arteriogram with peroneal atherectomy with subsequent calf hematoma evac and 4 compartment fasciotomy 1 Day Post-Op   R DP signal by doppler R lateral fasciotomy packed with wet  to dry; BID dressing changes Plan will be for fasciotomy closure and right 5th toe amp vs transmetatarsal amp on Thursday with Dr. Carlis Abbott Patient seen in conjunction with Dr. Suzette Battiest, PA-C [facility] [phone] 08/08/2018 8:18 AM  I have seen and evaluated the patient. I agree with the PA note as documented above. Right peroneal/TP trunk atherectomy yesterday.  Required fasciotomies yesterday evening for small calf hematoma.  Calf better.  DP signal much more brisk since intervention.  Will plan for right leg fasciotomy closure and right 5th toe amp vs tma Thursday.  Very high risk for limb loss even after intervention given severe tibial disease and poor runoff with no other options for revascularization.   [name], MD [facility] [place] Office: [phone] Pager: [phone]

## 2018-08-09 LAB — BASIC METABOLIC PANEL
Anion gap: 12 (ref 5–15)
BUN: 26 mg/dL — AB (ref 6–20)
CO2: 29 mmol/L (ref 22–32)
CREATININE: 6.76 mg/dL — AB (ref 0.61–1.24)
Calcium: 8.1 mg/dL — ABNORMAL LOW (ref 8.9–10.3)
Chloride: 90 mmol/L — ABNORMAL LOW (ref 98–111)
GFR calc Af Amer: 11 mL/min — ABNORMAL LOW (ref >60)
GFR calc non Af Amer: 9 mL/min — ABNORMAL LOW (ref >60)
Glucose, Bld: 265 mg/dL — ABNORMAL HIGH (ref 70–99)
Potassium: 3.9 mmol/L (ref 3.5–5.1)
Sodium: 131 mmol/L — ABNORMAL LOW (ref 135–145)

## 2018-08-09 LAB — CBC
HCT: 24.5 % — ABNORMAL LOW (ref 39.0–52.0)
Hemoglobin: 8 g/dL — ABNORMAL LOW (ref 13.0–17.0)
MCH: 28.2 pg (ref 26.0–34.0)
MCHC: 32.7 g/dL (ref 30.0–36.0)
MCV: 86.3 fL (ref 80.0–100.0)
Platelets: 402 10*3/uL — ABNORMAL HIGH (ref 150–400)
RBC: 2.84 MIL/uL — ABNORMAL LOW (ref 4.22–5.81)
RDW: 14.8 % (ref 11.5–15.5)
WBC: 13.3 10*3/uL — ABNORMAL HIGH (ref 4.0–10.5)
nRBC: 0 % (ref 0.0–0.2)

## 2018-08-09 LAB — CULTURE, BLOOD (ROUTINE X 2)
CULTURE: NO GROWTH
CULTURE: NO GROWTH
Special Requests: ADEQUATE
Special Requests: ADEQUATE

## 2018-08-09 LAB — GLUCOSE, CAPILLARY
GLUCOSE-CAPILLARY: 366 mg/dL — AB (ref 70–99)
Glucose-Capillary: 258 mg/dL — ABNORMAL HIGH (ref 70–99)
Glucose-Capillary: 172 mg/dL — ABNORMAL HIGH (ref 70–99)
Glucose-Capillary: 234 mg/dL — ABNORMAL HIGH (ref 70–99)

## 2018-08-09 NOTE — Progress Notes (Addendum)
  Progress Note    08/09/2018 9:08 AM 2 Days Post-Op  Subjective:  "My leg is leaking"   Vitals:   08/09/18 0502 08/09/18 0852  BP: (!) 105/43 (!) 110/48  Pulse: (!) 105 94  Resp: 18   Temp: 98.8 F (37.1 C) 98.8 F (37.1 C)  SpO2: 97% 100%   Physical Exam: Lungs:  Non labored Incisions:  R lateral fasciotomy incision with serosanguinous collection on dressing Extremities:  R DP monophasic by doppler Neurologic: A&O  CBC    Component Value Date/Time   WBC 13.3 (H) 08/09/2018 0320   RBC 2.84 (L) 08/09/2018 0320   HGB 8.0 (L) 08/09/2018 0320   HGB 14.3 02/07/2018 0937   HCT 24.5 (L) 08/09/2018 0320   HCT 42.8 02/07/2018 0937   PLT 402 (H) 08/09/2018 0320   PLT 369 02/07/2018 0937   MCV 86.3 08/09/2018 0320   MCV 82 02/07/2018 0937   MCH 28.2 08/09/2018 0320   MCHC 32.7 08/09/2018 0320   RDW 14.8 08/09/2018 0320   RDW 21.5 (H) 02/07/2018 0937   LYMPHSABS 1.7 08/04/2018 2035   MONOABS 0.7 08/04/2018 2035   EOSABS 0.2 08/04/2018 2035   BASOSABS 0.1 08/04/2018 2035    BMET    Component Value Date/Time   NA 131 (L) 08/09/2018 0320   NA 133 (L) 02/07/2018 0937   K 3.9 08/09/2018 0320   CL 90 (L) 08/09/2018 0320   CO2 29 08/09/2018 0320   GLUCOSE 265 (H) 08/09/2018 0320   GLUCOSE 262 03/06/2008   BUN 26 (H) 08/09/2018 0320   BUN 37 (H) 02/07/2018 0937   CREATININE 6.76 (H) 08/09/2018 0320   CALCIUM 8.1 (L) 08/09/2018 0320   GFRNONAA 9 (L) 08/09/2018 0320   GFRAA 11 (L) 08/09/2018 0320    INR    Component Value Date/Time   INR 1.25 11/04/2017 1450     Intake/Output Summary (Last 24 hours) at 08/09/2018 0908 Last data filed at 08/08/2018 2300 Gross per 24 hour  Intake 191.1 ml  Output 1900 ml  Net -1708.9 ml     Assessment/Plan:  42 y.o. male is s/p RLE arteriogram with peroneal atherectomy with subsequent calf hematoma evac and 4 compartment fasciotomy 2 Days Post-Op    Maintains R DP signal by doppler Continue BID wet to dry packing R  lateral fasciotomy incision Plan for fasciotomy closure with R 5th toe vs transmet amp tomorrow by Dr. Carlis Abbott Patient is aware of high risk for nonhealing R foot and eventual RLE amputation   Dagoberto Ligas, PA-C [facility] [phone] 08/09/2018 9:08 AM  I agree with the above.  I have seen and evaluated the patient.  He still complains of pain at the incision site in his right calf however he feels it is getting better.  He has a mono to biphasic peroneal signal on the right.  Plans are for amputation and fasciotomy closure tomorrow  Annamarie Major

## 2018-08-09 NOTE — Progress Notes (Signed)
Patient ID: [name] Brosh Sr., male   DOB: 02/17/1976, 42 y.o.   MRN: [id number]  [facility] Progress Note    Subjective:   Complaining of right leg pain   Objective:   BP (!) 110/48   Pulse 94   Temp 98.8 F (37.1 C) (Oral)   Resp 18   Ht 6\' 2"  (1.88 m)   Wt 82 kg   SpO2 100%   BMI 23.21 kg/m   Intake/Output: I/O last 3 completed shifts: In: 991.1 [P.O.:120; I.V.:871.1] Out: 1950 [Other:1900; Blood:50]   Intake/Output this shift:  No intake/output data recorded. Weight change: -1.3 kg  Physical Exam: Gen: NAD CVS: no rub Resp: cta Abd: bneing Ext:  S/p LBKA, right leg bandage soaked with some serosanguinous drainage, dry gangrene of 5th right toe, LAVF +T/B  Labs: BMET Recent Labs  Lab 08/04/18 2035 08/05/18 0416 08/06/18 0340 08/07/18 0506 08/08/18 0251 08/09/18 0320  NA 134* 133* 131* 130* 134* 131*  K 4.2 4.3 3.8 4.4 4.2 3.9  CL 94* 95* 91* 92* 93* 90*  CO2 19* 20* 24 25 20* 29  GLUCOSE 308* 227* 217* 201* 200* 265*  BUN 54* 59* 31* 33* 44* 26*  CREATININE 12.31* 12.85* 8.49* 7.14* 9.78* 6.76*  ALBUMIN 2.8*  --   --   --   --   --   CALCIUM 8.7* 8.5* 8.0* 8.7* 8.6* 8.1*   CBC Recent Labs  Lab 08/04/18 2035  08/06/18 0340 08/07/18 0506 08/08/18 0251 08/09/18 0320  WBC 8.8   < > 9.6 10.5 16.1* 13.3*  NEUTROABS 6.1  --   --   --   --   --   HGB 13.4   < > 12.3* 13.2 10.1* 8.0*  HCT 42.6   < > 38.8* 40.9 30.8* 24.5*  MCV 86.4   < > 85.1 87.2 84.6 86.3  PLT 423*   < > 460* 483* 478* 402*   < > = values in this interval not displayed.    @IMGRELPRIORS @ Medications:    . aspirin EC  81 mg Oral Daily  . atorvastatin  80 mg Oral q1800  . calcitRIOL  1.25 mcg Oral Q M,W,F-HD  . carvedilol  6.25 mg Oral Daily  . Chlorhexidine Gluconate Cloth  6 each Topical Q0600  . clopidogrel  75 mg Oral Daily  . heparin  5,000 Units Subcutaneous Q8H  . insulin aspart  0-9 Units Subcutaneous TID WC  . insulin glargine  6 Units  Subcutaneous QHS  . lamoTRIgine  150 mg Oral QHS  . lanthanum  2,000 mg Oral TID WC  . mupirocin ointment  1 application Nasal BID  . sertraline  50 mg Oral Daily  . sodium chloride flush  3 mL Intravenous Q12H   Dialysis Orders: MWF at [facility] 4hr, 200dialyzer, 500/800, EDW 80kg, 2K/2Ca, UFP #2, AVF, Heparin 2400 bolus - Calcitriol 1.69mcg PO q HD - No ESA (Last Hgb 14)  Assessment/ Plan:   1. PVD- with right 5th toe gangrene s/p revascularization complicated by compartment syndrome requiring emergent fasciotomies and hematoma evacuation.  VVS following.  For fasciotomy closure and amputation on 08/10/18 2. ESRD continue with HD q MWF and for next HD on 08/11/18 3. Anemia: s/p ABLA following surgery.  Was not on ESA prior to admission due to Hgb >12.  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  Nutrition: renal diet 6. Hypertension: stable 7. CAD s/p CABG- stable 8. DM type 2- per primary   Donetta Potts, MD [place] Pager [phone] 08/09/2018, 10:47 AM

## 2018-08-09 NOTE — Progress Notes (Signed)
PROGRESS NOTE    [name] Lave Sr.  KZL:[id number] DOB: [birth year] DOA: 08/04/2018 PCP: Patient, [name] Per     Brief Narrative:  [name] Francois a 42 y.o.malewithhistory of CAD status post CABG, ESRD on hemodialysis Monday Wednesday Friday, diabetes mellitus type 2, peripheral vascular disease status post amputation of multiple toes, hypertension presents to the ER because of increasing pain in the right foot with discoloration of his right foot small toe. Patient symptoms started 10 days ago which has been progressively worsening. MRI positive for osteomyelitis of 4th and 5th digits. Underwent right lower extremity arteriogram with peroneal atherectomy with subsequent calf hematoma which required evacuation and 4 compartment fasciotomy on 12/23.  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 Orthopedic surgery consulted Pain control Continue vanco/zosyn  Blood cultures negative to date  Vascular surgery planning for right 5th toe vs transmetatarsal amputation on Thursday    PAD S/p RLE arteriogram with peroneal atherectomy with subsequent calf hematoma evacuation and 4 compartment fasciotomy 12/23  Continue aspirin, plavix  HLD Continue lipitor   HTN Continue coreg   ESRD  Nephrology following  DM Lantus, SSI  Mood disorder Continue lamictal, zoloft   Hyponatremia Mild, monitor BMP    DVT prophylaxis:  subq hep Code Status: Full Family Communication: No family at bedside Disposition Plan: Pending surgery 12/26   Consultants:   Vascular surgery  Nephrology  Orthopedic surgery   Procedures:   S/p RLE arteriogram with peroneal atherectomy with subsequent calf hematoma evac and 4 compartment fasciotomy Dr. Donnetta Hutching   Antimicrobials:  Anti-infectives (From admission, onward)   Start     Dose/Rate Route Frequency Ordered Stop   08/11/18 1200  vancomycin (VANCOCIN) IVPB 1000 mg/200 mL premix     1,000 mg 200 mL/hr over 60 Minutes Intravenous Every M-W-F (Hemodialysis) 08/07/18 0843     08/08/18 1200  vancomycin (VANCOCIN) IVPB 1000 mg/200 mL premix     1,000 mg 200 mL/hr over 60 Minutes Intravenous Every Tue (Hemodialysis) 08/07/18 0843 08/15/18 1159   08/07/18 1200  vancomycin (VANCOCIN) IVPB 1000 mg/200 mL premix  Status:  Discontinued     1,000 mg 200 mL/hr over 60 Minutes Intravenous Every M-W-F (Hemodialysis) 08/05/18 0349 08/07/18 0843   08/05/18 1000  piperacillin-tazobactam (ZOSYN) IVPB 3.375 g     3.375 g 12.5 mL/hr over 240 Minutes Intravenous Every 12 hours 08/05/18 0343     08/05/18 0345  vancomycin (VANCOCIN) IVPB 1000 mg/200 mL premix     1,000 mg 200 mL/hr over 60 Minutes Intravenous  Once 08/05/18 0340 08/05/18 0553   08/04/18 2030  piperacillin-tazobactam (ZOSYN) IVPB 3.375 g     3.375 g 100 mL/hr over 30 Minutes Intravenous  Once 08/04/18 2024 08/04/18 2247   08/04/18 2030  vancomycin (VANCOCIN) IVPB 1000 mg/200 mL premix     1,000 mg 200 mL/hr over 60 Minutes Intravenous  Once 08/04/18  2028 08/04/18 2353       Objective: Vitals:   08/08/18 1602 08/08/18 2023 08/09/18 0502 08/09/18 0852  BP: (!) 96/50 (!) 118/58 (!) 105/43 (!) 110/48  Pulse: 94 100 (!) 105 94  Resp: 20 18 18    Temp: 98.1 F (36.7 C) 99.1 F (37.3 C) 98.8 F (37.1 C) 98.8 F (37.1 C)  TempSrc: Oral Oral Oral Oral  SpO2:  97% 97% 100%  Weight: 82.5 kg  82 kg   Height:         Intake/Output Summary (Last 24 hours) at 08/09/2018 1232 Last data filed at 08/08/2018 2300 Gross per 24 hour  Intake 191.1 ml  Output 1900 ml  Net -1708.9 ml   Filed Weights   08/08/18 1225 08/08/18 1602 08/09/18 0502  Weight: 84.5 kg 82.5 kg 82 kg     Examination: General exam: Appears calm and comfortable  Respiratory system: Clear to auscultation. Respiratory effort normal. Cardiovascular system: S1 & S2 heard, RRR. No JVD, murmurs, rubs, gallops or clicks. No pedal edema. Gastrointestinal system: Abdomen is nondistended, soft and nontender. No organomegaly or masses felt. Normal bowel sounds heard. Central nervous system: Alert and oriented. No focal neurological deficits. Extremities: + Left BKA, right calf wrapped in dry and clean dressing, right fifth toe with gangrenous changes Skin: No rashes, lesions or ulcers Psychiatry: Judgement and insight appear normal. Mood & affect appropriate.    Data Reviewed: I have personally reviewed following labs and imaging studies  CBC: Recent Labs  Lab 08/04/18 2035 08/05/18 0416 08/06/18 0340 08/07/18 0506 08/08/18 0251 08/09/18 0320  WBC 8.8 10.8* 9.6 10.5 16.1* 13.3*  NEUTROABS 6.1  --   --   --   --   --   HGB 13.4 11.7* 12.3* 13.2 10.1* 8.0*  HCT 42.6 37.6* 38.8* 40.9 30.8* 24.5*  MCV 86.4 86.0 85.1 87.2 84.6 86.3  PLT 423* 461* 460* 483* 478* 366*   Basic Metabolic Panel: Recent Labs  Lab 08/05/18 0416 08/06/18 0340 08/07/18 0506 08/08/18 0251 08/09/18 0320  NA 133* 131* 130* 134* 131*  K 4.3 3.8 4.4 4.2 3.9  CL 95* 91* 92* 93* 90*  CO2 20* 24 25 20* 29  GLUCOSE 227* 217* 201* 200* 265*  BUN 59* 31* 33* 44* 26*  CREATININE 12.85* 8.49* 7.14* 9.78* 6.76*  CALCIUM 8.5* 8.0* 8.7* 8.6* 8.1*   GFR: Estimated Creatinine Clearance: 16.7 mL/min (A) (by C-G formula based on SCr of 6.76 mg/dL (H)). Liver Function Tests: Recent Labs  Lab 08/04/18 2035  AST 13*  ALT 12  ALKPHOS 54  BILITOT 0.7  PROT 8.0   ALBUMIN 2.8*   No results for input(s): LIPASE, AMYLASE in the last 168 hours. No results for input(s): AMMONIA in the last 168 hours. Coagulation Profile: No results for input(s): INR, PROTIME in the last 168 hours. Cardiac Enzymes: Recent Labs  Lab 08/04/18 2035  TROPONINI 0.03*   BNP (last 3 results) No results for input(s): PROBNP in the last 8760 hours. HbA1C: No results for input(s): HGBA1C in the last 72 hours. CBG: Recent Labs  Lab 08/08/18 1712 08/08/18 1828 08/08/18 2118 08/09/18 0608 08/09/18 1142  GLUCAP 160* 225* 236* 258* 172*   Lipid Profile: No results for input(s): CHOL, HDL, LDLCALC, TRIG, CHOLHDL, LDLDIRECT in the last 72 hours. Thyroid Function Tests: No results for input(s): TSH, T4TOTAL, FREET4, T3FREE, THYROIDAB in the last 72 hours. Anemia Panel: No results for input(s): VITAMINB12, FOLATE, FERRITIN, TIBC, IRON, RETICCTPCT in the last 72  hours. Sepsis Labs: Recent Labs  Lab 08/04/18 2035  LATICACIDVEN 1.8    Recent Results (from the past 240 hour(s))  Blood culture (routine x 2)     Status: None   Collection Time: 08/04/18  8:35 PM  Result Value Ref Range Status   Specimen Description BLOOD RIGHT HAND  Final   Special Requests   Final    BOTTLES DRAWN AEROBIC AND ANAEROBIC Blood Culture adequate volume   Culture NO GROWTH 5 DAYS  Final   Report Status 08/09/2018 FINAL  Final  Blood culture (routine x 2)     Status: None   Collection Time: 08/04/18  9:30 PM  Result Value Ref Range Status   Specimen Description BLOOD RIGHT HAND  Final   Special Requests   Final    BOTTLES DRAWN AEROBIC AND ANAEROBIC Blood Culture adequate volume   Culture NO GROWTH 5 DAYS  Final   Report Status 08/09/2018 FINAL  Final  MRSA PCR Screening     Status: Abnormal   Collection Time: 08/05/18  2:08 AM  Result Value Ref Range Status   MRSA by PCR POSITIVE (A) NEGATIVE Final    Comment:        The GeneXpert MRSA Assay (FDA approved for NASAL  specimens only), is one component of a comprehensive MRSA colonization surveillance program. It is not intended to diagnose MRSA infection nor to guide or monitor treatment for MRSA infections. RESULT CALLED TO, READ BACK BY AND VERIFIED WITHLendell Caprice RN [id number] 12/21/196 A BROWNING Performed at [facility], [place] [address], [place], [place] [address]        Radiology Studies: No results found.    Scheduled Meds: . aspirin EC  81 mg Oral Daily  . atorvastatin  80 mg Oral q1800  . calcitRIOL  1.25 mcg Oral Q M,W,F-HD  . carvedilol  6.25 mg Oral Daily  . Chlorhexidine Gluconate Cloth  6 each Topical Q0600  . clopidogrel  75 mg Oral Daily  . heparin  5,000 Units Subcutaneous Q8H  . insulin aspart  0-9 Units Subcutaneous TID WC  . insulin glargine  6 Units Subcutaneous QHS  . lamoTRIgine  150 mg Oral QHS  . lanthanum  2,000 mg Oral TID WC  . mupirocin ointment  1 application Nasal BID  . sertraline  50 mg Oral Daily  . sodium chloride flush  3 mL Intravenous Q12H   Continuous Infusions: . sodium chloride 250 mL (08/05/18 2244)  . sodium chloride    . sodium chloride    . sodium chloride    . piperacillin-tazobactam (ZOSYN)  IV 3.375 g (08/09/18 0900)  . vancomycin    . [START ON 08/11/2018] vancomycin       LOS: 4 days    Time spent: 20 minutes   Dessa Phi, DO [facility] [email or web address] Password [facility] 08/09/2018, 12:32 PM

## 2018-08-09 NOTE — Progress Notes (Signed)
Pharmacy Antibiotic Note  [name] Russom. is a 42 y.o. male admitted on 08/04/2018 with R-toe cellulitis/wound infection.  Pharmacy has been consulted for Vancomycin/Zosyn dosing.   The patient is ESRD-MWF however is dialyzing on Sun/Tues/Fri this week per the holiday schedule. The patient was noted to receive an extra HD on 12/21 with no Vancomycin maintenance dose given. The patient's estimated level currently was still expected to be therapeutic, so held off on checking a level and had planned next Vanc dose for 12/24 after HD. However, signed off HD and Vanc dose not given.    Right fifth toe amputation and fasciotomy closure scheduled for 10:50am on 12/26  Plan:   Random Vanc level in am to be sure level remains adequate.   Will re-dose if Vanc level < 15 mcg/ml   Vancomycin 1 gm IV after HD currently scheduled to resume with HD on 12/27    Continue Zosyn 3.375G IV q12h to be infused over 4 hours   F/u ortho/VVS plans for intervention and/or amputation to address antibiotic LOT - Will continue to follow HD schedule/duration, culture results, LOT, and antibiotic de-escalation plans   Height: 6\' 2"  (188 cm) Weight: 180 lb 12.4 oz (82 kg) IBW/kg (Calculated) : 82.2  Temp (24hrs), Avg:98.7 F (37.1 C), Min:98.1 F (36.7 C), Max:99.1 F (37.3 C)  Recent Labs  Lab 08/04/18 2035 08/05/18 0416 08/06/18 0340 08/07/18 0506 08/08/18 0251 08/09/18 0320  WBC 8.8 10.8* 9.6 10.5 16.1* 13.3*  CREATININE 12.31* 12.85* 8.49* 7.14* 9.78* 6.76*  LATICACIDVEN 1.8  --   --   --   --   --      Allergies  Allergen Reactions  . Coconut Oil Anaphylaxis    Can use topically, allergic to coconut foods    Vanc 12/20 >> - Doses: 2g LD (1g + 1g - completed 12/21), 1g (12/22 post-HD) - HD: 12/21 (4 hr BFR 400, end 1500), 12/22 (4 hr BFR 400, end 1130) - HD planned 12/24 but signed off early, Vanc not given Zosyn 12/20 >>  12/20 Blood x 2 - negative 12/21 MRSA PCR - positive >  CHG/Bactroban 12/23>>(12/27)  Thank you for allowing pharmacy to be a part of this patient's care.  Arty Baumgartner, [place] Pager: [phone] or phone: [phone] 08/09/2018 3:41 PM

## 2018-08-10 ENCOUNTER — Inpatient Hospital Stay (HOSPITAL_COMMUNITY): Payer: Medicaid Other | Admitting: Certified Registered Nurse Anesthetist

## 2018-08-10 ENCOUNTER — Encounter (HOSPITAL_COMMUNITY): Payer: Self-pay | Admitting: *Deleted

## 2018-08-10 ENCOUNTER — Encounter (HOSPITAL_COMMUNITY)
Admission: EM | Disposition: A | Discharge status: Home Health | Payer: Self-pay | Source: Home / Self Care | Attending: Internal Medicine

## 2018-08-10 HISTORY — PX: TRANSMETATARSAL AMPUTATION: SHX6197

## 2018-08-10 HISTORY — PX: FASCIOTOMY CLOSURE: SHX5829

## 2018-08-10 LAB — RENAL FUNCTION PANEL
Albumin: 2.2 g/dL — ABNORMAL LOW (ref 3.5–5.0)
Anion gap: 12 (ref 5–15)
BUN: 37 mg/dL — ABNORMAL HIGH (ref 6–20)
CO2: 27 mmol/L (ref 22–32)
Calcium: 8.2 mg/dL — ABNORMAL LOW (ref 8.9–10.3)
Chloride: 89 mmol/L — ABNORMAL LOW (ref 98–111)
Creatinine, Ser: 9.55 mg/dL — ABNORMAL HIGH (ref 0.61–1.24)
GFR calc Af Amer: 7 mL/min — ABNORMAL LOW (ref >60)
GFR, EST NON AFRICAN AMERICAN: 6 mL/min — AB (ref >60)
Glucose, Bld: 295 mg/dL — ABNORMAL HIGH (ref 70–99)
Phosphorus: 5.2 mg/dL — ABNORMAL HIGH (ref 2.5–4.6)
Potassium: 3.9 mmol/L (ref 3.5–5.1)
Sodium: 128 mmol/L — ABNORMAL LOW (ref 135–145)

## 2018-08-10 LAB — POCT I-STAT 4, (NA,K, GLUC, HGB,HCT)
Glucose, Bld: 129 mg/dL — ABNORMAL HIGH (ref 70–99)
HCT: 29 % — ABNORMAL LOW (ref 39.0–52.0)
Hemoglobin: 9.9 g/dL — ABNORMAL LOW (ref 13.0–17.0)
POTASSIUM: 4.3 mmol/L (ref 3.5–5.1)
SODIUM: 131 mmol/L — AB (ref 135–145)

## 2018-08-10 LAB — CBC
HCT: 18.1 % — ABNORMAL LOW (ref 39.0–52.0)
Hemoglobin: 5.7 g/dL — CL (ref 13.0–17.0)
MCH: 27 pg (ref 26.0–34.0)
MCHC: 31.5 g/dL (ref 30.0–36.0)
MCV: 85.8 fL (ref 80.0–100.0)
NRBC: 0 % (ref 0.0–0.2)
Platelets: 355 10*3/uL (ref 150–400)
RBC: 2.11 MIL/uL — ABNORMAL LOW (ref 4.22–5.81)
RDW: 14.6 % (ref 11.5–15.5)
WBC: 13.8 10*3/uL — ABNORMAL HIGH (ref 4.0–10.5)

## 2018-08-10 LAB — GLUCOSE, CAPILLARY
Glucose-Capillary: 284 mg/dL — ABNORMAL HIGH (ref 70–99)
Glucose-Capillary: 112 mg/dL — ABNORMAL HIGH (ref 70–99)
Glucose-Capillary: 212 mg/dL — ABNORMAL HIGH (ref 70–99)
Glucose-Capillary: 333 mg/dL — ABNORMAL HIGH (ref 70–99)

## 2018-08-10 LAB — VANCOMYCIN, RANDOM
VANCOMYCIN RM: 12
Vancomycin Rm: 21

## 2018-08-10 LAB — PREPARE RBC (CROSSMATCH)

## 2018-08-10 SURGERY — FASCIOTOMY CLOSURE
Anesthesia: General | Site: Toe | Laterality: Right

## 2018-08-10 MED ORDER — SODIUM CHLORIDE 0.9 % IV SOLN
INTRAVENOUS | Status: DC | PRN
  Administered 2018-08-10: 30 ug/min via INTRAVENOUS

## 2018-08-10 MED ORDER — SODIUM CHLORIDE 0.9% IV SOLUTION: Freq: Once | INTRAVENOUS | Status: DC

## 2018-08-10 MED ORDER — FENTANYL CITRATE (PF) 100 MCG/2ML IJ SOLN: 25.0000 ug | INTRAMUSCULAR | Status: DC | PRN

## 2018-08-10 MED ORDER — SODIUM CHLORIDE 0.9 % IV SOLN
INTRAVENOUS | Status: DC
  Administered 2018-08-10: 11:00:00 via INTRAVENOUS

## 2018-08-10 MED ORDER — OXYCODONE HCL 5 MG PO TABS: 5.0000 mg | ORAL_TABLET | Freq: Once | ORAL | Status: DC | PRN

## 2018-08-10 MED ORDER — HYDROMORPHONE HCL 1 MG/ML IJ SOLN
0.2500 mg | INTRAMUSCULAR | Status: DC | PRN
  Administered 2018-08-10 (×2): 0.5 mg via INTRAVENOUS

## 2018-08-10 MED ORDER — ACETAMINOPHEN 160 MG/5ML PO SOLN: 325.0000 mg | ORAL | Status: DC | PRN

## 2018-08-10 MED ORDER — MEPERIDINE HCL 50 MG/ML IJ SOLN: 6.2500 mg | INTRAMUSCULAR | Status: DC | PRN

## 2018-08-10 MED ORDER — ONDANSETRON HCL 4 MG/2ML IJ SOLN: 4.0000 mg | Freq: Once | INTRAMUSCULAR | Status: DC | PRN

## 2018-08-10 MED ORDER — ROCURONIUM BROMIDE 10 MG/ML (PF) SYRINGE
PREFILLED_SYRINGE | INTRAVENOUS | Status: DC | PRN
  Administered 2018-08-10: 10 mg via INTRAVENOUS
  Administered 2018-08-10: 40 mg via INTRAVENOUS

## 2018-08-10 MED ORDER — MIDAZOLAM HCL 5 MG/5ML IJ SOLN
INTRAMUSCULAR | Status: DC | PRN
  Administered 2018-08-10: 1 mg via INTRAVENOUS

## 2018-08-10 MED ORDER — PHENYLEPHRINE 40 MCG/ML (10ML) SYRINGE FOR IV PUSH (FOR BLOOD PRESSURE SUPPORT)
PREFILLED_SYRINGE | INTRAVENOUS | Status: DC | PRN
  Administered 2018-08-10: 120 ug via INTRAVENOUS
  Administered 2018-08-10: 80 ug via INTRAVENOUS
  Administered 2018-08-10: 120 ug via INTRAVENOUS
  Administered 2018-08-10: 80 ug via INTRAVENOUS

## 2018-08-10 MED ORDER — ONDANSETRON HCL 4 MG/2ML IJ SOLN
INTRAMUSCULAR | Status: DC | PRN
  Administered 2018-08-10: 4 mg via INTRAVENOUS

## 2018-08-10 MED ORDER — LIDOCAINE 2% (20 MG/ML) 5 ML SYRINGE
INTRAMUSCULAR | Status: DC | PRN
  Administered 2018-08-10: 100 mg via INTRAVENOUS

## 2018-08-10 MED ORDER — HYDROMORPHONE HCL 1 MG/ML IJ SOLN
INTRAMUSCULAR | Status: AC
  Filled 2018-08-10: qty 1

## 2018-08-10 MED ORDER — MIDAZOLAM HCL 2 MG/2ML IJ SOLN
INTRAMUSCULAR | Status: AC
  Filled 2018-08-10: qty 2

## 2018-08-10 MED ORDER — OXYCODONE HCL 5 MG/5ML PO SOLN: 5.0000 mg | Freq: Once | ORAL | Status: DC | PRN

## 2018-08-10 MED ORDER — SODIUM CHLORIDE 0.9 % IV SOLN
INTRAVENOUS | Status: DC | PRN
  Administered 2018-08-10 (×2): via INTRAVENOUS

## 2018-08-10 MED ORDER — DIPHENHYDRAMINE HCL 25 MG PO CAPS
25.0000 mg | ORAL_CAPSULE | Freq: Once | ORAL | Status: AC
  Administered 2018-08-10: 25 mg via ORAL
  Filled 2018-08-10: qty 1

## 2018-08-10 MED ORDER — ONDANSETRON HCL 4 MG/2ML IJ SOLN
INTRAMUSCULAR | Status: AC
  Filled 2018-08-10: qty 2

## 2018-08-10 MED ORDER — VANCOMYCIN HCL IN DEXTROSE 1-5 GM/200ML-% IV SOLN
1000.0000 mg | INTRAVENOUS | Status: DC
  Filled 2018-08-10: qty 200

## 2018-08-10 MED ORDER — FENTANYL CITRATE (PF) 250 MCG/5ML IJ SOLN
INTRAMUSCULAR | Status: AC
  Filled 2018-08-10: qty 5

## 2018-08-10 MED ORDER — PROPOFOL 10 MG/ML IV BOLUS
INTRAVENOUS | Status: DC | PRN
  Administered 2018-08-10: 50 mg via INTRAVENOUS
  Administered 2018-08-10: 150 mg via INTRAVENOUS

## 2018-08-10 MED ORDER — SUGAMMADEX SODIUM 200 MG/2ML IV SOLN
INTRAVENOUS | Status: DC | PRN
  Administered 2018-08-10: 200 mg via INTRAVENOUS

## 2018-08-10 MED ORDER — INSULIN GLARGINE 100 UNIT/ML ~~LOC~~ SOLN
12.0000 [IU] | Freq: Every day | SUBCUTANEOUS | Status: DC
  Administered 2018-08-10: 12 [IU] via SUBCUTANEOUS
  Filled 2018-08-10: qty 0.12

## 2018-08-10 MED ORDER — DARBEPOETIN ALFA 200 MCG/0.4ML IJ SOSY: 200.0000 ug | PREFILLED_SYRINGE | INTRAMUSCULAR | Status: DC

## 2018-08-10 MED ORDER — ACETAMINOPHEN 325 MG PO TABS: 325.0000 mg | ORAL_TABLET | ORAL | Status: DC | PRN

## 2018-08-10 MED ORDER — VANCOMYCIN HCL IN DEXTROSE 1-5 GM/200ML-% IV SOLN
1000.0000 mg | Freq: Once | INTRAVENOUS | Status: AC
  Administered 2018-08-10: 1000 mg via INTRAVENOUS
  Filled 2018-08-10: qty 200

## 2018-08-10 MED ORDER — FENTANYL CITRATE (PF) 250 MCG/5ML IJ SOLN
INTRAMUSCULAR | Status: DC | PRN
  Administered 2018-08-10 (×3): 50 ug via INTRAVENOUS
  Administered 2018-08-10: 25 ug via INTRAVENOUS
  Administered 2018-08-10: 75 ug via INTRAVENOUS

## 2018-08-10 MED ORDER — RENA-VITE PO TABS
1.0000 | ORAL_TABLET | Freq: Every day | ORAL | Status: DC
  Administered 2018-08-10 – 2018-08-16 (×7): 1 via ORAL
  Filled 2018-08-10 (×7): qty 1

## 2018-08-10 MED ORDER — 0.9 % SODIUM CHLORIDE (POUR BTL) OPTIME
TOPICAL | Status: DC | PRN
  Administered 2018-08-10: 1000 mL

## 2018-08-10 MED FILL — Calcitriol Cap 0.5 MCG: ORAL | Qty: 2 | Status: AC

## 2018-08-10 MED FILL — Calcitriol Cap 0.25 MCG: ORAL | Qty: 1 | Status: AC

## 2018-08-10 SURGICAL SUPPLY — 40 items
BANDAGE ACE 4X5 VEL STRL LF (GAUZE/BANDAGES/DRESSINGS) ×4 IMPLANT
BLADE AVERAGE 25MMX9MM (BLADE) ×1
BLADE AVERAGE 25X9 (BLADE) ×1 IMPLANT
BNDG GAUZE ELAST 4 BULKY (GAUZE/BANDAGES/DRESSINGS) ×4 IMPLANT
CANISTER SUCT 3000ML PPV (MISCELLANEOUS) ×4 IMPLANT
COVER SURGICAL LIGHT HANDLE (MISCELLANEOUS) ×4 IMPLANT
DRAPE EXTREMITY T 121X128X90 (DRAPE) ×2 IMPLANT
DRAPE HALF SHEET 40X57 (DRAPES) ×4 IMPLANT
DRSG ADAPTIC 3X8 NADH LF (GAUZE/BANDAGES/DRESSINGS) ×2 IMPLANT
ELECT REM PT RETURN 9FT ADLT (ELECTROSURGICAL) ×4
ELECTRODE REM PT RTRN 9FT ADLT (ELECTROSURGICAL) ×2 IMPLANT
GAUZE SPONGE 4X4 12PLY STRL (GAUZE/BANDAGES/DRESSINGS) ×4 IMPLANT
GLOVE BIO SURGEON STRL SZ 6.5 (GLOVE) ×2 IMPLANT
GLOVE BIO SURGEON STRL SZ7.5 (GLOVE) ×4 IMPLANT
GLOVE BIO SURGEONS STRL SZ 6.5 (GLOVE) ×2
GLOVE BIOGEL PI IND STRL 6.5 (GLOVE) IMPLANT
GLOVE BIOGEL PI IND STRL 8 (GLOVE) ×2 IMPLANT
GLOVE BIOGEL PI INDICATOR 6.5 (GLOVE) ×2
GLOVE BIOGEL PI INDICATOR 8 (GLOVE) ×2
GLOVE SURG SS PI 6.0 STRL IVOR (GLOVE) ×2 IMPLANT
GOWN STRL REUS W/ TWL LRG LVL3 (GOWN DISPOSABLE) ×6 IMPLANT
GOWN STRL REUS W/ TWL XL LVL3 (GOWN DISPOSABLE) ×4 IMPLANT
GOWN STRL REUS W/TWL LRG LVL3 (GOWN DISPOSABLE) ×8
GOWN STRL REUS W/TWL XL LVL3 (GOWN DISPOSABLE) ×4
KIT BASIN OR (CUSTOM PROCEDURE TRAY) ×4 IMPLANT
KIT TURNOVER KIT B (KITS) ×4 IMPLANT
NS IRRIG 1000ML POUR BTL (IV SOLUTION) ×4 IMPLANT
PACK GENERAL/GYN (CUSTOM PROCEDURE TRAY) ×4 IMPLANT
PAD ARMBOARD 7.5X6 YLW CONV (MISCELLANEOUS) ×8 IMPLANT
SUT ETHILON 2 0 PSLX (SUTURE) ×6 IMPLANT
SUT ETHILON 3 0 PS 1 (SUTURE) ×12 IMPLANT
SUT MNCRL AB 4-0 PS2 18 (SUTURE) IMPLANT
SUT PROLENE 5 0 C 1 24 (SUTURE) ×2 IMPLANT
SUT VIC AB 2-0 CTX 36 (SUTURE) IMPLANT
SUT VIC AB 3-0 SH 27 (SUTURE) ×8
SUT VIC AB 3-0 SH 27X BRD (SUTURE) IMPLANT
TOWEL GREEN STERILE (TOWEL DISPOSABLE) ×8 IMPLANT
TOWEL GREEN STERILE FF (TOWEL DISPOSABLE) ×4 IMPLANT
UNDERPAD 30X30 (UNDERPADS AND DIAPERS) ×4 IMPLANT
WATER STERILE IRR 1000ML POUR (IV SOLUTION) ×4 IMPLANT

## 2018-08-10 NOTE — Progress Notes (Signed)
PROGRESS NOTE    [name] Staron Sr.  KVQ:[id number] DOB: 06-20-1976 DOA: 08/04/2018 PCP: Patient, [name] Per     Brief Narrative:  [name] Francois a 42 y.o.malewithhistory of CAD status post CABG, ESRD on hemodialysis Monday Wednesday Friday, diabetes mellitus type 2, peripheral vascular disease status post amputation of multiple toes, hypertension presents to the ER because of increasing pain in the right foot with discoloration of his right foot small toe. Patient symptoms started 10 days ago which has been progressively worsening. MRI positive for osteomyelitis of 4th and 5th digits. Underwent right lower extremity arteriogram with peroneal atherectomy with subsequent calf hematoma which required evacuation and 4 compartment fasciotomy on 12/23.  New events last 24 hours / Subjective: Seen in HD, states he is tired. Overnight events of bleeding and drop in Hgb noted. Plan for OR this morning after HD.   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 Orthopedic surgery consulted Pain control Continue vanco/zosyn  Blood cultures negative to date  Vascular surgery planning for right 5th toe vs transmetatarsal amputation today with fasciotomy closure   PAD S/p RLE arteriogram with peroneal atherectomy with subsequent calf hematoma evacuation and 4 compartment fasciotomy 12/23  Aspirin, plavix  Acute blood loss anemia on chronic anemia of chronic kidney disease 3u pRBC to be transfused today prior to OR  Monitor CBC    HLD Continue lipitor   HTN Continue coreg   ESRD  Nephrology following  DM with hyperglycemia Lantus, SSI Increase lantus dose today  Mood disorder Continue lamictal, zoloft   Hyponatremia Monitor BMP    DVT prophylaxis: subq hep Code Status: Full Family Communication: No family at bedside Disposition Plan: Pending surgery 12/26   Consultants:   Vascular surgery  Nephrology  Orthopedic surgery   Procedures:   S/p RLE arteriogram with peroneal atherectomy with subsequent calf hematoma evac and 4 compartment fasciotomy Dr. Donnetta Hutching   Antimicrobials:  Anti-infectives (From admission, onward)   Start     Dose/Rate Route Frequency Ordered Stop   08/11/18 1200  vancomycin (VANCOCIN) IVPB 1000 mg/200 mL premix     1,000 mg 200 mL/hr over 60 Minutes Intravenous Every M-W-F (Hemodialysis) 08/07/18 0843     08/10/18 0500  vancomycin (VANCOCIN) IVPB 1000 mg/200 mL premix     1,000 mg 200 mL/hr over 60 Minutes Intravenous  Once 08/10/18 0401 08/10/18 0637   08/08/18 1200  vancomycin (VANCOCIN) IVPB 1000 mg/200 mL premix     1,000 mg 200 mL/hr over 60 Minutes Intravenous Every Tue (Hemodialysis) 08/07/18 0843 08/15/18 1159   08/07/18 1200  vancomycin (VANCOCIN) IVPB 1000 mg/200 mL premix  Status:  Discontinued     1,000 mg 200 mL/hr over 60 Minutes Intravenous Every M-W-F (Hemodialysis) 08/05/18 0349 08/07/18 0843   08/05/18 1000  piperacillin-tazobactam (ZOSYN) IVPB 3.375 g     3.375 g 12.5 mL/hr over 240 Minutes Intravenous Every 12 hours 08/05/18 0343     08/05/18 0345  vancomycin (VANCOCIN) IVPB 1000 mg/200 mL  premix     1,000 mg 200 mL/hr over 60 Minutes Intravenous  Once 08/05/18 0340 08/05/18 0553   08/04/18 2030  piperacillin-tazobactam (ZOSYN) IVPB 3.375 g     3.375 g 100 mL/hr over 30 Minutes Intravenous  Once 08/04/18 2024 08/04/18 2247   08/04/18 2030  vancomycin (VANCOCIN) IVPB 1000 mg/200 mL premix     1,000 mg 200 mL/hr over 60 Minutes Intravenous   Once 08/04/18 2028 08/04/18 2353       Objective: Vitals:   08/10/18 0734 08/10/18 0800 08/10/18 0815 08/10/18 0830  BP: 107/62 (!) 98/37 (!) 128/110 (!) 111/44  Pulse: 91 97 71 92  Resp: 13 (!) 8 20 12   Temp:  98.3 F (36.8 C) 98.3 F (36.8 C) 98 F (36.7 C)  TempSrc:      SpO2:      Weight:      Height:        Intake/Output Summary (Last 24 hours) at 08/10/2018 0849 Last data filed at 08/10/2018 0830 Gross per 24 hour  Intake 555 ml  Output -  Net 555 ml   Filed Weights   08/09/18 0502 08/10/18 0500 08/10/18 0729  Weight: 82 kg 82 kg 83.3 kg     Examination: General exam: Appears calm and comfortable, fatigued appearing  Respiratory system: Clear to auscultation. Respiratory effort normal. Cardiovascular system: S1 & S2 heard, tachycardic, regular rhythm. No JVD, murmurs, rubs, gallops or clicks. No pedal edema. Gastrointestinal system: Abdomen is nondistended, soft and nontender. No organomegaly or masses felt. Normal bowel sounds heard. Central nervous system: Alert and oriented. No focal neurological deficits. Extremities: +left BKA, +right calf in dressing soaked in sanguinous fluid, +right fifth toe with gangrenous changes  Psychiatry: Judgement and insight appear normal. Mood & affect appropriate.     Data Reviewed: I have personally reviewed following labs and imaging studies  CBC: Recent Labs  Lab 08/04/18 2035  08/06/18 0340 08/07/18 0506 08/08/18 0251 08/09/18 0320 08/10/18 0303  WBC 8.8   < > 9.6 10.5 16.1* 13.3* 13.8*  NEUTROABS 6.1  --   --   --   --   --   --   HGB 13.4   < > 12.3* 13.2 10.1* 8.0* 5.7*  HCT 42.6   < > 38.8* 40.9 30.8* 24.5* 18.1*  MCV 86.4   < > 85.1 87.2 84.6 86.3 85.8  PLT 423*   < > 460* 483* 478* 402* 355   < > = values in this interval not displayed.   Basic Metabolic Panel: Recent Labs  Lab 08/06/18 0340 08/07/18 0506 08/08/18 0251 08/09/18 0320 08/10/18 0303  NA 131* 130* 134* 131* 128*  K 3.8 4.4 4.2 3.9  3.9  CL 91* 92* 93* 90* 89*  CO2 24 25 20* 29 27  GLUCOSE 217* 201* 200* 265* 295*  BUN 31* 33* 44* 26* 37*  CREATININE 8.49* 7.14* 9.78* 6.76* 9.55*  CALCIUM 8.0* 8.7* 8.6* 8.1* 8.2*  PHOS  --   --   --   --  5.2*   GFR: Estimated Creatinine Clearance: 11.8 mL/min (A) (by C-G formula based on SCr of 9.55 mg/dL (H)). Liver Function Tests: Recent Labs  Lab 08/04/18 2035 08/10/18 0303  AST 13*  --   ALT 12  --   ALKPHOS 54  --   BILITOT 0.7  --   PROT 8.0  --   ALBUMIN 2.8* 2.2*   No results for input(s): LIPASE, AMYLASE in the last 168 hours. No  results for input(s): AMMONIA in the last 168 hours. Coagulation Profile: No results for input(s): INR, PROTIME in the last 168 hours. Cardiac Enzymes: Recent Labs  Lab 08/04/18 2035  TROPONINI 0.03*   BNP (last 3 results) No results for input(s): PROBNP in the last 8760 hours. HbA1C: No results for input(s): HGBA1C in the last 72 hours. CBG: Recent Labs  Lab 08/09/18 0608 08/09/18 1142 08/09/18 1640 08/09/18 2050 08/10/18 0619  GLUCAP 258* 172* 366* 234* 284*   Lipid Profile: No results for input(s): CHOL, HDL, LDLCALC, TRIG, CHOLHDL, LDLDIRECT in the last 72 hours. Thyroid Function Tests: No results for input(s): TSH, T4TOTAL, FREET4, T3FREE, THYROIDAB in the last 72 hours. Anemia Panel: No results for input(s): VITAMINB12, FOLATE, FERRITIN, TIBC, IRON, RETICCTPCT in the last 72 hours. Sepsis Labs: Recent Labs  Lab 08/04/18 2035  LATICACIDVEN 1.8    Recent Results (from the past 240 hour(s))  Blood culture (routine x 2)     Status: None   Collection Time: 08/04/18  8:35 PM  Result Value Ref Range Status   Specimen Description BLOOD RIGHT HAND  Final   Special Requests   Final    BOTTLES DRAWN AEROBIC AND ANAEROBIC Blood Culture adequate volume   Culture NO GROWTH 5 DAYS  Final   Report Status 08/09/2018 FINAL  Final  Blood culture (routine x 2)     Status: None   Collection Time: 08/04/18  9:30 PM   Result Value Ref Range Status   Specimen Description BLOOD RIGHT HAND  Final   Special Requests   Final    BOTTLES DRAWN AEROBIC AND ANAEROBIC Blood Culture adequate volume   Culture NO GROWTH 5 DAYS  Final   Report Status 08/09/2018 FINAL  Final  MRSA PCR Screening     Status: Abnormal   Collection Time: 08/05/18  2:08 AM  Result Value Ref Range Status   MRSA by PCR POSITIVE (A) NEGATIVE Final    Comment:        The GeneXpert MRSA Assay (FDA approved for NASAL specimens only), is one component of a comprehensive MRSA colonization surveillance program. It is not intended to diagnose MRSA infection nor to guide or monitor treatment for MRSA infections. RESULT CALLED TO, READ BACK BY AND VERIFIED WITHLendell Caprice RN [id number] 12/21/196 A BROWNING Performed at [facility], [place] [address] [place],  [address]        Radiology Studies: No results found.    Scheduled Meds: . sodium chloride   Intravenous Once  . aspirin EC  81 mg Oral Daily  . atorvastatin  80 mg Oral q1800  . calcitRIOL  1.25 mcg Oral Q M,W,F-HD  . Chlorhexidine Gluconate Cloth  6 each Topical Q0600  . clopidogrel  75 mg Oral Daily  . darbepoetin (ARANESP) injection - DIALYSIS  200 mcg Intravenous Q Thu-HD  . heparin  5,000 Units Subcutaneous Q8H  . insulin aspart  0-9 Units Subcutaneous TID WC  . insulin glargine  6 Units Subcutaneous QHS  . lamoTRIgine  150 mg Oral QHS  . lanthanum  2,000 mg Oral TID WC  . multivitamin  1 tablet Oral QHS  . mupirocin ointment  1 application Nasal BID  . sertraline  50 mg Oral Daily  . sodium chloride flush  3 mL Intravenous Q12H   Continuous Infusions: . sodium chloride 250 mL (08/05/18 2244)  . sodium chloride    . sodium chloride    . sodium chloride    .  piperacillin-tazobactam (ZOSYN)  IV 3.375 g (08/09/18 2130)  . vancomycin    . [START ON 08/11/2018] vancomycin       LOS: 5 days    Time spent: 25 minutes   Dessa Phi, DO [facility] [email or web address] Password [facility] 08/10/2018, 8:49 AM

## 2018-08-10 NOTE — Progress Notes (Signed)
0630: Dialysis RN Nicanor Alcon called for report, notified pt is going to OR at 1050, will call back after I get in touch with or.  0635: got in touch with vascular or staff, and reported pt is supposed to get 3 units of blood, needs dialysis and surgery this am. Staff stated to call DR. Clark.  [id number]: DRCarlis Abbott paged.  0642: DRCarlis Abbott called back, notified MD that pt is schedule for surgery, needs 3 units of blood , hemoglobin 5.7 this am, and also schedule for dialysis this am. MD stated pt can go to short dialysis before surgery and make sure pt receives 3 units of blood during dialysis. Notified MD pt has been bleeding from right fasciotomy site and dressing has been changed twice since 1 am.  3790: report called to [facility] RN in dialysis. Notified pt needs 3 units of blood , and schedule for surgery at 1050 am, and DR. Clark insisted short dialysis before surgery.  0700: pt transported to hemodialysis via bed with transport. CCMD notifed. Passed on to day shift RN>

## 2018-08-10 NOTE — Op Note (Signed)
Date: August 10, 2018  Preoperative diagnosis: Right lower extremity fasciotomies with necrotic right fifth toe  Postoperative diagnosis: Same  Seizure: 1.  Right lower extremity lateral fasciotomy closure 2.  Ray amputation of right fifth toe  Surgeon: Dr. [name], MD  Assistant: Leontine Locket, PA  Indications: Patient is a 42 year old male who presented to the hospital this weekend with dry gangrene of his right fifth toe.  He had previously undergone right first and second toe amputations by Dr. Jeanett Schlein.  Vascular surgery was subsequently engaged for evaluation of the patient and he underwent a right lower extremity arteriogram with atherectomy of his peroneal artery and attempted retrograde AT recannalation.  Ultimately had a hematoma postop and required right lower extremity fasciotomies.  The medial fasciotomy was closed at the time of the initial fasciotomies and he presents today for lateral fasciotomy closure and also for fifth toe amputation.  He understands this toe amputation is high risk and I have said I am worried about risk for non-healing.    Findings: Right lateral fasciotomy washout with general oozing from the skin edges and one muscular branch that was oversewn with a 5-0 Prolene.  Lateral fasciotomy completely closed with horizontal mattress sutures using 2-0 nylons. Amputation of right fifth toe with metatarsal head passed off the field for culture and Gram stain.  There was some oozing in the wound bed but bleeding was not overly brisk.  Anesthesia: General  Details: Patient was taken to the operating room after informed consent was obtained.  He was placed on operative table in supine position.  His right leg and foot was then prepped and draped in standard sterile fashion.  We did place a sterile bag over his right foot and did the clean portion of the case first.  The right lateral fasciotomy incision was then irrigated and all the muscular tissue was  irrigated.  We found one bleeder from a intramuscular branch and this was oversewn with a 5-0 Prolene in interrupted fashion.  Otherwise the skin edges appear to be the source for bleeding.  We then used 2-0 nylon's in horizontal mattress format and performed closure of the lateral fasciotomy.  Dry sterile dressings were applied.  Attention to the right foot where a tennis racquet incision was made at the base of the right fifth toe extending to the metatarsal head laterally.  Ultimately the toe was transected at the metatarsal head and passed off the field.  I then took a rongeur and took cultures from the metatarsal head that were passed for Gram stain and culture.  Rongeur was then used to debride back all the necrotic tissue until we had healthy tissue.  The wound was irrigated.  There was no frank pus.  I then placed several interrupted nylon sutures in loose format to try and approximate the wound.  Some oozing from wound bed but not brisk bleeding.  A dry sterile dressing was applied.  He was taken the PACU in stable condition.  Condition: Stable  [name], MD [facility] [place] Office: [phone] Pager: [place]

## 2018-08-10 NOTE — Progress Notes (Signed)
Subjective: Interval History: has complaints R leg pain..  Objective: Vital signs in last 24 hours: Temp:  [97.9 F (36.6 C)-98.3 F (36.8 C)] 98 F (36.7 C) (12/26 0830) Pulse Rate:  [71-97] 92 (12/26 0830) Resp:  [8-20] 12 (12/26 0830) BP: (98-145)/(36-110) 111/44 (12/26 0830) SpO2:  [95 %-98 %] 98 % (12/26 0729) Weight:  [82 kg-83.3 kg] 83.3 kg (12/26 0729) Weight change: -2.5 kg  Intake/Output from previous day: 12/25 0701 - 12/26 0700 In: 240 [P.O.:240] Out: -  Intake/Output this shift: Total I/O In: 315 [Blood:315] Out: -   General appearance: cooperative and mild distress Resp: diminished breath sounds bilaterally Cardio: S1, S2 normal and systolic murmur: systolic ejection 2/6, crescendo and decrescendo at 2nd left intercostal space GI: soft, liver down 5 cm Extremities: L BKA, some toes gone on R, drainage from R calf wound.  Lab Results: Recent Labs    08/09/18 0320 08/10/18 0303  WBC 13.3* 13.8*  HGB 8.0* 5.7*  HCT 24.5* 18.1*  PLT 402* 355   BMET:  Recent Labs    08/09/18 0320 08/10/18 0303  NA 131* 128*  K 3.9 3.9  CL 90* 89*  CO2 29 27  GLUCOSE 265* 295*  BUN 26* 37*  CREATININE 6.76* 9.55*  CALCIUM 8.1* 8.2*   No results for input(s): PTH in the last 72 hours. Iron Studies: No results for input(s): IRON, TIBC, TRANSFERRIN, FERRITIN in the last 72 hours.  Studies/Results: No results found.  I have reviewed the patient's current medications.  Assessment/Plan: 1 ESRD HD 2 Anemia blood loss, will start esa also 3 DM poor control 4 HPTH vit D 5 PVD  ? 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 ?for angio 6 CAD 7 Nonadherence severe issue making all issues worse P HD, esa, TX, OR    LOS: 5 days   [name] Darenda Fike 08/10/2018,8:54 AM

## 2018-08-10 NOTE — Transfer of Care (Signed)
Immediate Anesthesia Transfer of Care Note  Patient: [name] Sargeant Sr.  Procedure(s) Performed: FASCIOTOMY CLOSURE RIGHT LOWER EXTREMITY (Right Leg Lower) RIGHT FIFTH TOE AMPUTATION (Right Toe)  Patient Location: PACU  Anesthesia Type:General  Level of Consciousness: awake, alert  and oriented  Airway & Oxygen Therapy: Patient Spontanous Breathing  Post-op Assessment: Report given to RN and Post -op Vital signs reviewed and stable  Post vital signs: Reviewed and stable  Last Vitals:  Vitals Value Taken Time  BP 156/63 08/10/2018 12:47 PM  Temp    Pulse 95 08/10/2018 12:48 PM  Resp 7 08/10/2018 12:48 PM  SpO2 98 % 08/10/2018 12:48 PM  Vitals shown include unvalidated device data.  Last Pain:  Vitals:   08/10/18 0953  TempSrc: Oral  PainSc: 0-No pain      Patients Stated Pain Goal: 0 (89/79/15 0413)  Complications: No apparent anesthesia complications

## 2018-08-10 NOTE — Progress Notes (Signed)
Pharmacy Antibiotic Note  [name]. is a 42 y.o. male admitted on 08/04/2018 with R-toe cellulitis/wound infection.  Pharmacy has been consulted for Vancomycin dosing. Vancomycin level this morning subtherapeutic   Right fifth toe amputation and fasciotomy closure scheduled for today   PM f/u > pt only had about 2 hrs of HD today.  Checked random vancomycin level = 21./  Plan: No additional vancomycin doses needed tonight. F/u plans for HD again in the morning 12/27.   Height: 6\' 2"  (188 cm) Weight: 182 lb 15.7 oz (83 kg) IBW/kg (Calculated) : 82.2  Temp (24hrs), Avg:98.2 F (36.8 C), Min:97.5 F (36.4 C), Max:99.1 F (37.3 C)  Recent Labs  Lab 08/04/18 2035  08/06/18 0340 08/07/18 0506 08/08/18 0251 08/09/18 0320 08/10/18 0303 08/10/18 1659  WBC 8.8   < > 9.6 10.5 16.1* 13.3* 13.8*  --   CREATININE 12.31*   < > 8.49* 7.14* 9.78* 6.76* 9.55*  --   LATICACIDVEN 1.8  --   --   --   --   --   --   --   VANCORANDOM  --   --   --   --   --   --  12 21   < > = values in this interval not displayed.     Allergies  Allergen Reactions  . Coconut Oil Anaphylaxis    Can use topically, allergic to coconut foods    Vanc 12/20 >> - Doses: 2g LD (1g + 1g - completed 12/21), 1g (12/22 post-HD) - HD: 12/21 (4 hr BFR 400, end 1500), 12/22 (4 hr BFR 400, end 1130) - HD planned 12/24 but signed off early, Vanc not given Zosyn 12/20 >>  12/20 Blood x 2 - negative 12/21 MRSA PCR - positive > CHG/Bactroban 12/23>>(12/27)  Marguerite Olea, [facility] Clinical Pharmacist Phone [phone]  08/10/2018 6:15 PM

## 2018-08-10 NOTE — Progress Notes (Signed)
Pt AO x4. Denies any dizziness or sob, complained of Right leg pain, not yet time for pain medication. notifed pt its not time for pain medication yet and his pressure is little low right now to get it , pt voiced understanding. Lab in the room for type and screen. Right leg  Fasciotomy site dressing has large amount of bloody drainage. will continue to monitor.

## 2018-08-10 NOTE — Progress Notes (Signed)
CRITICAL VALUE ALERT  Critical Value:  Hemoglobin 5.7   Date & Time Notied:  08/10/18, 4076   Provider Notified: DR. Baltazar Najjar   Orders Received/Actions taken: MD placed order for type and screen and 3 units of blood transfusion, and stated to call and notify vascular team. Notified MD pt is ao x4, received prn pain mediation around 0300, little sleepy. BP 129/36,  having hard time getting manual BP, and pt is bleeding from fasciotomy site.  Will continue to monitor.

## 2018-08-10 NOTE — Progress Notes (Signed)
Pharmacy Antibiotic Note  Brenson Hartman. is a 42 y.o. male admitted on 08/04/2018 with R-toe cellulitis/wound infection.  Pharmacy has been consulted for Vancomycin dosing. Vancomycin level this morning subtherapeutic   Right fifth toe amputation and fasciotomy closure scheduled for today   Plan: Vancomycin 1 g IV now  Height: 6\' 2"  (188 cm) Weight: 180 lb 12.4 oz (82 kg) IBW/kg (Calculated) : 82.2  Temp (24hrs), Avg:98.6 F (37 C), Min:98.2 F (36.8 C), Max:98.8 F (37.1 C)  Recent Labs  Lab 08/04/18 2035  08/06/18 0340 08/07/18 0506 08/08/18 0251 08/09/18 0320 08/10/18 0303  WBC 8.8   < > 9.6 10.5 16.1* 13.3* 13.8*  CREATININE 12.31*   < > 8.49* 7.14* 9.78* 6.76* 9.55*  LATICACIDVEN 1.8  --   --   --   --   --   --   VANCORANDOM  --   --   --   --   --   --  12   < > = values in this interval not displayed.     Allergies  Allergen Reactions  . Coconut Oil Anaphylaxis    Can use topically, allergic to coconut foods    Vanc 12/20 >> - Doses: 2g LD (1g + 1g - completed 12/21), 1g (12/22 post-HD) - HD: 12/21 (4 hr BFR 400, end 1500), 12/22 (4 hr BFR 400, end 1130) - HD planned 12/24 but signed off early, Vanc not given Zosyn 12/20 >>  12/20 Blood x 2 - negative 12/21 MRSA PCR - positive > CHG/Bactroban 12/23>>(12/27)  Caryl Pina 08/10/2018 3:59 AM

## 2018-08-10 NOTE — Anesthesia Procedure Notes (Signed)
Procedure Name: Intubation Date/Time: 08/10/2018 11:45 AM Performed by: Harden Mo, CRNA Pre-anesthesia Checklist: Patient identified, Emergency Drugs available, Suction available and Patient being monitored Patient Re-evaluated:Patient Re-evaluated prior to induction Oxygen Delivery Method: Circle System Utilized Preoxygenation: Pre-oxygenation with 100% oxygen Induction Type: IV induction Ventilation: Mask ventilation without difficulty and Oral airway inserted - appropriate to patient size Laryngoscope Size: Sabra Heck and 2 Grade View: Grade II Tube type: Oral Tube size: 7.5 mm Number of attempts: 1 Airway Equipment and Method: Stylet and Oral airway Placement Confirmation: ETT inserted through vocal cords under direct vision,  positive ETCO2 and breath sounds checked- equal and bilateral Secured at: 23 cm Tube secured with: Tape Dental Injury: Teeth and Oropharynx as per pre-operative assessment

## 2018-08-10 NOTE — Plan of Care (Signed)

## 2018-08-10 NOTE — Anesthesia Postprocedure Evaluation (Signed)
Anesthesia Post Note  Patient: Rodrigo Mcgranahan Sr.  Procedure(s) Performed: FASCIOTOMY CLOSURE RIGHT LOWER EXTREMITY (Right Leg Lower) RIGHT FIFTH TOE AMPUTATION (Right Toe)     Patient location during evaluation: PACU Anesthesia Type: General Level of consciousness: awake and alert Pain management: pain level controlled Vital Signs Assessment: post-procedure vital signs reviewed and stable Respiratory status: spontaneous breathing, nonlabored ventilation, respiratory function stable and patient connected to nasal cannula oxygen Cardiovascular status: blood pressure returned to baseline and stable Postop Assessment: no apparent nausea or vomiting Anesthetic complications: no    Last Vitals:  Vitals:   08/10/18 1356 08/10/18 1410  BP:  (!) 181/43  Pulse: 99 97  Resp: 15 17  Temp:  37.2 C  SpO2: 100% 100%    Last Pain:  Vitals:   08/10/18 1410  TempSrc: Oral  PainSc:                  Chanele Douglas

## 2018-08-10 NOTE — Progress Notes (Signed)
0422: paged DR. Trula Slade  [phone]: MD called back, Notified MD pt's hemoglobin is 5.7 this am, and pt is having bloody drainage from right leg fasciotomy site.  I told MD that triad MD is aware and placed order for type and screen and 3 units blood transfusion. DR. Trula Slade stated to go ahead with blood transfusion and they will take care of pt in OR this am. Will continue to monitor.

## 2018-08-10 NOTE — Anesthesia Preprocedure Evaluation (Addendum)
Anesthesia Evaluation  Patient identified by MRN, date of birth, ID band Patient awake  General Assessment Comment:Emergent surgery for Dr. Angela Cox, MD  Reviewed: Allergy & Precautions, NPO status , Patient's Chart, lab work & pertinent test results  History of Anesthesia Complications (+) PONV and history of anesthetic complications  Airway Mallampati: II  TM Distance: >3 FB Neck ROM: Full    Dental  (+) Teeth Intact, Dental Advisory Given   Pulmonary Current Smoker,    breath sounds clear to auscultation       Cardiovascular hypertension, + angina + CAD, + Peripheral Vascular Disease and +CHF   Rhythm:Regular Rate:Normal     Neuro/Psych    GI/Hepatic Neg liver ROS, GERD  ,  Endo/Other  diabetes, Type 2  Renal/GU ARF, CRF and ESRFRenal disease     Musculoskeletal   Abdominal   Peds  Hematology  (+) Blood dyscrasia, anemia ,   Anesthesia Other Findings ECHO 3/19 Left Ventricle Normal cavity size. Concentric hypertrophy of mild severity. LV systolic function is low normal with an EF of 50-55%. There are no obvious wall motion abnormalities.  Aortic Valve The valve is trileaflet. Normal leaflet separation. No stenosis. No regurgitation.    Reproductive/Obstetrics                           Anesthesia Physical  Anesthesia Plan  ASA: IV  Anesthesia Plan: General   Post-op Pain Management:    Induction: Intravenous  PONV Risk Score and Plan: 2 and Treatment may vary due to age or medical condition and Midazolam  Airway Management Planned: LMA and Oral ETT  Additional Equipment:   Intra-op Plan:   Post-operative Plan: Possible Post-op intubation/ventilation  Informed Consent: I have reviewed the patients History and Physical, chart, labs and discussed the procedure including the risks, benefits and alternatives for the proposed anesthesia with the patient or  authorized representative who has indicated his/her understanding and acceptance.   Dental advisory given  Plan Discussed with: CRNA, Anesthesiologist and Surgeon  Anesthesia Plan Comments:        Anesthesia Quick Evaluation

## 2018-08-10 NOTE — Procedures (Signed)
I was present at this session.  I have reviewed the session itself and made appropriate changes.  HD via LUA avf.  bp in low 100s, tol well.  To get [name] 12/26/20198:52 AM

## 2018-08-10 NOTE — Progress Notes (Signed)
Pt received 3 units of PRBCs in HD; well tolerated no s/s of a infection noted.

## 2018-08-11 ENCOUNTER — Encounter (HOSPITAL_COMMUNITY): Payer: Self-pay | Admitting: Vascular Surgery

## 2018-08-11 LAB — CBC
HCT: 25.1 % — ABNORMAL LOW (ref 39.0–52.0)
HEMOGLOBIN: 8.1 g/dL — AB (ref 13.0–17.0)
MCH: 28 pg (ref 26.0–34.0)
MCHC: 32.3 g/dL (ref 30.0–36.0)
MCV: 86.9 fL (ref 80.0–100.0)
Platelets: 344 10*3/uL (ref 150–400)
RBC: 2.89 MIL/uL — ABNORMAL LOW (ref 4.22–5.81)
RDW: 14.1 % (ref 11.5–15.5)
WBC: 14.9 10*3/uL — ABNORMAL HIGH (ref 4.0–10.5)
nRBC: 0 % (ref 0.0–0.2)

## 2018-08-11 LAB — RENAL FUNCTION PANEL
Albumin: 2.2 g/dL — ABNORMAL LOW (ref 3.5–5.0)
Anion gap: 14 (ref 5–15)
BUN: 30 mg/dL — ABNORMAL HIGH (ref 6–20)
CO2: 27 mmol/L (ref 22–32)
Calcium: 8.1 mg/dL — ABNORMAL LOW (ref 8.9–10.3)
Chloride: 91 mmol/L — ABNORMAL LOW (ref 98–111)
Creatinine, Ser: 7.89 mg/dL — ABNORMAL HIGH (ref 0.61–1.24)
GFR calc Af Amer: 9 mL/min — ABNORMAL LOW (ref >60)
GFR calc non Af Amer: 8 mL/min — ABNORMAL LOW (ref >60)
GLUCOSE: 297 mg/dL — AB (ref 70–99)
Phosphorus: 4.9 mg/dL — ABNORMAL HIGH (ref 2.5–4.6)
Potassium: 4.4 mmol/L (ref 3.5–5.1)
Sodium: 132 mmol/L — ABNORMAL LOW (ref 135–145)

## 2018-08-11 LAB — BPAM RBC
Blood Product Expiration Date: 202001252359
Blood Product Expiration Date: 202001252359
Blood Product Expiration Date: 202001262359
ISSUE DATE / TIME: 201912260755
ISSUE DATE / TIME: 201912260755
ISSUE DATE / TIME: 201912260755
Unit Type and Rh: 7300
Unit Type and Rh: 7300
Unit Type and Rh: 7300

## 2018-08-11 LAB — GLUCOSE, CAPILLARY
Glucose-Capillary: 246 mg/dL — ABNORMAL HIGH (ref 70–99)
Glucose-Capillary: 154 mg/dL — ABNORMAL HIGH (ref 70–99)
Glucose-Capillary: 186 mg/dL — ABNORMAL HIGH (ref 70–99)
Glucose-Capillary: 253 mg/dL — ABNORMAL HIGH (ref 70–99)

## 2018-08-11 LAB — TYPE AND SCREEN
ABO/RH(D): B POS
Antibody Screen: NEGATIVE
Unit division: 0
Unit division: 0
Unit division: 0

## 2018-08-11 MED ORDER — INSULIN GLARGINE 100 UNIT/ML ~~LOC~~ SOLN
16.0000 [IU] | Freq: Every day | SUBCUTANEOUS | Status: DC
  Administered 2018-08-11 – 2018-08-16 (×6): 16 [IU] via SUBCUTANEOUS
  Filled 2018-08-11 (×7): qty 0.16

## 2018-08-11 MED ORDER — AMOXICILLIN-POT CLAVULANATE 875-125 MG PO TABS
1.0000 | ORAL_TABLET | Freq: Two times a day (BID) | ORAL | Status: DC
  Administered 2018-08-11 – 2018-08-12 (×3): 1 via ORAL
  Filled 2018-08-11 (×3): qty 1

## 2018-08-11 MED ORDER — CHLORHEXIDINE GLUCONATE CLOTH 2 % EX PADS
6.0000 | MEDICATED_PAD | Freq: Every day | CUTANEOUS | Status: DC
  Administered 2018-08-11 – 2018-08-15 (×4): 6 via TOPICAL

## 2018-08-11 MED ORDER — INSULIN ASPART 100 UNIT/ML ~~LOC~~ SOLN
0.0000 [IU] | Freq: Three times a day (TID) | SUBCUTANEOUS | Status: DC
  Administered 2018-08-11 – 2018-08-12 (×4): 2 [IU] via SUBCUTANEOUS
  Administered 2018-08-12: 1 [IU] via SUBCUTANEOUS
  Administered 2018-08-13 – 2018-08-14 (×3): 3 [IU] via SUBCUTANEOUS
  Administered 2018-08-15 (×2): 2 [IU] via SUBCUTANEOUS
  Administered 2018-08-16: 3 [IU] via SUBCUTANEOUS
  Administered 2018-08-16: 1 [IU] via SUBCUTANEOUS

## 2018-08-11 MED ORDER — INSULIN ASPART 100 UNIT/ML ~~LOC~~ SOLN
0.0000 [IU] | Freq: Every day | SUBCUTANEOUS | Status: DC
  Administered 2018-08-11 – 2018-08-16 (×3): 3 [IU] via SUBCUTANEOUS

## 2018-08-11 NOTE — Progress Notes (Signed)
Subjective: Interval History: has complaints leg pain.  Objective: Vital signs in last 24 hours: Temp:  [97.5 F (36.4 C)-99.1 F (37.3 C)] 99.1 F (37.3 C) (12/27 0331) Pulse Rate:  [91-102] 97 (12/26 1410) Resp:  [11-21] 13 (12/27 0331) BP: (125-181)/(43-85) 159/50 (12/27 0331) SpO2:  [96 %-100 %] 97 % (12/27 0331) Weight:  [83 kg-84.1 kg] 84.1 kg (12/27 0500) Weight change: 1.3 kg  Intake/Output from previous day: 12/26 0701 - 12/27 0700 In: 1437.5 [I.V.:492.5; Blood:945] Out: 1610 [Urine:850; Blood:20] Intake/Output this shift: No intake/output data recorded.  General appearance: alert, cooperative and mild distress Resp: clear to auscultation bilaterally Cardio: S1, S2 normal and systolic murmur: systolic ejection 2/6, crescendo and decrescendo at 2nd left intercostal space GI: soft, pos bs, liver down 5 cm Extremities: L BKA.  dressing on R foot, toe amp  Lab Results: Recent Labs    08/10/18 0303 08/10/18 1103 08/11/18 0348  WBC 13.8*  --  14.9*  HGB 5.7* 9.9* 8.1*  HCT 18.1* 29.0* 25.1*  PLT 355  --  344   BMET:  Recent Labs    08/10/18 0303 08/10/18 1103 08/11/18 0348  NA 128* 131* 132*  K 3.9 4.3 4.4  CL 89*  --  91*  CO2 27  --  27  GLUCOSE 295* 129* 297*  BUN 37*  --  30*  CREATININE 9.55*  --  7.89*  CALCIUM 8.2*  --  8.1*   No results for input(s): PTH in the last 72 hours. Iron Studies: No results for input(s): IRON, TIBC, TRANSFERRIN, FERRITIN in the last 72 hours.  Studies/Results: No results found.  I have reviewed the patient's current medications.  Assessment/Plan: 1 ESRD off day but cont as is until more stable 2 HTN controlled 3 Anemai esa, TX yest 4 HPTH vit D, cinn 5 DM poor control 6 PVD per VVS 7 CAD 8Nonadherence P Hd tomorrow, esa max, bp control, pain control,     LOS: 6 days   [name] Fabricio Endsley 08/11/2018,9:31 AM

## 2018-08-11 NOTE — Evaluation (Signed)
Physical Therapy Evaluation Patient Details Name: [name]. MRN: [id number] DOB: 1975-11-02 Today's Date: 08/11/2018   History of Present Illness  Pt adm with gangrene on rt foot. Pt underwent amputation of rt 5th toe and fasciotomy of rt leg. Closure rt lateral fasciotomy. PMH - lt bka, rt 1st and 2nd toe amputation, cabg, esrd on hd, htn, dm  Clinical Impression  Pt presented to PT with limited mobility due to painful RLE. Pt able to demonstrate modified independent transfer from bed to chair and back. Pt lives in accessible apartment and has w/c he uses at times. From PT standpoint pt can return home when medically ready. Will follow while pt here and expect his ambulation will progress as pain improves. Doubt will need PT after DC.     Follow Up Recommendations No PT follow up    Equipment Recommendations  None recommended by PT    Recommendations for Other Services       Precautions / Restrictions Precautions Precautions: None Required Braces or Orthoses: Other Brace Other Brace: Pt with post op shoe on rt Restrictions Other Position/Activity Restrictions: assume weight bearing on rt heel only      Mobility  Bed Mobility Overal bed mobility: Independent                Transfers Overall transfer level: Modified independent Equipment used: None Transfers: Public house manager;Lateral/Scoot Transfers     Squat pivot transfers: Modified independent (Device/Increase time)    Lateral/Scoot Transfers: Modified independent (Device/Increase time) General transfer comment: Pt able to perform with prosthetic on. Pt declined full stand due to pain in RLE  Ambulation/Gait             General Gait Details: Declined due to pain  Stairs            Wheelchair Mobility    Modified Rankin (Stroke Patients Only)       Balance                                             Pertinent Vitals/Pain Pain Assessment:  Faces Faces Pain Scale: Hurts even more Pain Location: RLE Pain Descriptors / Indicators: Guarding;Grimacing Pain Intervention(s): Limited activity within patient's tolerance;Premedicated before session    Home Living Family/patient expects to be discharged to:: Private residence Living Arrangements: Spouse/significant other Available Help at Discharge: Family Type of Home: Apartment Home Access: Level entry     Home Layout: One level Home Equipment: Grab bars - toilet;Grab bars - tub/shower;Wheelchair - [employer] - 2 wheels;Crutches      Prior Function Level of Independence: Independent with assistive device(s)         Comments: amb with prosthetic. Uses w/c as needed. Takes transportation to get to HD     Hand Dominance        Extremity/Trunk Assessment   Upper Extremity Assessment Upper Extremity Assessment: Overall WFL for tasks assessed    Lower Extremity Assessment Lower Extremity Assessment: RLE deficits/detail;LLE deficits/detail RLE Deficits / Details: Limited by pain. Multiple toe amputations LLE Deficits / Details: BKA       Communication   Communication: No difficulties  Cognition Arousal/Alertness: Awake/alert Behavior During Therapy: WFL for tasks assessed/performed Overall Cognitive Status: Within Functional Limits for tasks assessed  General Comments      Exercises     Assessment/Plan    PT Assessment Patient needs continued PT services  PT Problem List Decreased mobility;Pain       PT Treatment Interventions DME instruction;Gait training;Functional mobility training;Patient/family education    PT Goals (Current goals can be found in the Care Plan section)  Acute Rehab PT Goals Patient Stated Goal: return home PT Goal Formulation: With patient Time For Goal Achievement: 08/18/18 Potential to Achieve Goals: Good    Frequency Min 3X/week   Barriers to discharge         Co-evaluation               AM-PAC PT "6 Clicks" Mobility  Outcome Measure Help needed turning from your back to your side while in a flat bed without using bedrails?: None Help needed moving from lying on your back to sitting on the side of a flat bed without using bedrails?: None Help needed moving to and from a bed to a chair (including a wheelchair)?: None Help needed standing up from a chair using your arms (e.g., wheelchair or bedside chair)?: Total Help needed to walk in hospital room?: Total Help needed climbing 3-5 steps with a railing? : Total 6 Click Score: 15    End of Session   Activity Tolerance: Patient limited by pain Patient left: in bed;with call bell/phone within reach Nurse Communication: Mobility status PT Visit Diagnosis: Other abnormalities of gait and mobility (R26.89);Pain Pain - Right/Left: Right Pain - part of body: Leg    Time: 1534-1550 PT Time Calculation (min) (ACUTE ONLY): 16 min   Charges:   PT Evaluation $PT Eval Low Complexity: [place] Pager [phone] Office [place] 08/11/2018, 4:20 PM

## 2018-08-11 NOTE — Progress Notes (Addendum)
  Progress Note    08/11/2018 7:55 AM 1 Day Post-Op  Subjective:  No complaints; says his foot feels better  Tm 99.1  Vitals:   08/10/18 2014 08/11/18 0331  BP: (!) 137/48 (!) 159/50  Pulse:    Resp: 14 13  Temp: 98.8 F (37.1 C) 99.1 F (37.3 C)  SpO2: 98% 97%    Physical Exam: Cardiac:  regular Lungs:  Non labored Incisions:  lateral fasciotomy incision with some bloody ooze that looks old-mild pressure dressing placed; medial incision is clean with staples in tact; toe amp site is clean; small amount of bloody drainage on bandage Extremities:  +doppler signal right DP>PT   CBC    Component Value Date/Time   WBC 14.9 (H) 08/11/2018 0348   RBC 2.89 (L) 08/11/2018 0348   HGB 8.1 (L) 08/11/2018 0348   HGB 14.3 02/07/2018 0937   HCT 25.1 (L) 08/11/2018 0348   HCT 42.8 02/07/2018 0937   PLT 344 08/11/2018 0348   PLT 369 02/07/2018 0937   MCV 86.9 08/11/2018 0348   MCV 82 02/07/2018 0937   MCH 28.0 08/11/2018 0348   MCHC 32.3 08/11/2018 0348   RDW 14.1 08/11/2018 0348   RDW 21.5 (H) 02/07/2018 0937   LYMPHSABS 1.7 08/04/2018 2035   MONOABS 0.7 08/04/2018 2035   EOSABS 0.2 08/04/2018 2035   BASOSABS 0.1 08/04/2018 2035    BMET    Component Value Date/Time   NA 132 (L) 08/11/2018 0348   NA 133 (L) 02/07/2018 0937   K 4.4 08/11/2018 0348   CL 91 (L) 08/11/2018 0348   CO2 27 08/11/2018 0348   GLUCOSE 297 (H) 08/11/2018 0348   GLUCOSE 262 03/06/2008   BUN 30 (H) 08/11/2018 0348   BUN 37 (H) 02/07/2018 0937   CREATININE 7.89 (H) 08/11/2018 0348   CALCIUM 8.1 (L) 08/11/2018 0348   GFRNONAA 8 (L) 08/11/2018 0348   GFRAA 9 (L) 08/11/2018 0348    INR    Component Value Date/Time   INR 1.25 11/04/2017 1450     Intake/Output Summary (Last 24 hours) at 08/11/2018 0755 Last data filed at 08/10/2018 1500 Gross per 24 hour  Intake 1437.5 ml  Output 1425 ml  Net 12.5 ml     Assessment:  42 y.o. male is s/p:  1.  Right lower extremity lateral  fasciotomy closure 2.  Ray amputation of right fifth toe  1 Day Post-Op  Plan: -pt with +DP>PT doppler signal on the right -lateral fasciotomy incision with some bloody ooze that looks old-mild pressure dressing placed; medial incision is clean with staples in tact -toe amp site is clean; small amount of bloody drainage on bandage -DVT prophylaxis:  Sq heparin    Leontine Locket, PA-C [facility] [phone] 08/11/2018 7:55 AM  I have seen and evaluated the patient. I agree with the PA note as documented above. Right lateral fasciotomy closed yesterday and right 5th toe amp.  Dressings changed today and toe wound c/d/i.  Right DP>PT signal after peroneal atherectomy.  He understands high risk for non-healing toe amp.  [name], MD [facility] [place] Office: [phone] Pager: [phone]

## 2018-08-11 NOTE — Progress Notes (Signed)
Pharmacy Antibiotic Note  [name]. is a 42 y.o. male admitted on 08/04/2018 with R-toe cellulitis/wound infectionPharmacy has been consulted for Vancomycin dosing.  S/p Right fifth toe amputation and fasciotomy closure 12/26  Day # 6 of broad spectrum antibiotics  Plan: Vancomycin 1 gram after each HD session Zosyn 3.375 grams iv Q 12 hours  What is LOT/ add stop date?  Height: 6\' 2"  (188 cm) Weight: 185 lb 6.5 oz (84.1 kg) IBW/kg (Calculated) : 82.2  Temp (24hrs), Avg:98.7 F (37.1 C), Min:97.5 F (36.4 C), Max:99.1 F (37.3 C)  Recent Labs  Lab 08/04/18 2035  08/07/18 0506 08/08/18 0251 08/09/18 0320 08/10/18 0303 08/10/18 1659 08/11/18 0348  WBC 8.8   < > 10.5 16.1* 13.3* 13.8*  --  14.9*  CREATININE 12.31*   < > 7.14* 9.78* 6.76* 9.55*  --  7.89*  LATICACIDVEN 1.8  --   --   --   --   --   --   --   VANCORANDOM  --   --   --   --   --  12 21  --    < > = values in this interval not displayed.     Allergies  Allergen Reactions  . Coconut Oil Anaphylaxis    Can use topically, allergic to coconut foods    Vanc 12/20 >> - Doses: 2g LD (1g + 1g - completed 12/21), 1g (12/22 post-HD) - HD: 12/21 (4 hr BFR 400, end 1500), 12/22 (4 hr BFR 400, end 1130) - HD planned 12/24 but signed off early, Vanc not given - HD 12/26 for 2 hours Zosyn 12/20 >>  12/20 Blood x 2 - negative 12/21 MRSA PCR - positive > CHG/Bactroban 12/23>>(12/27)  Thank you Anette Guarneri, PharmD [phone] 08/11/2018 10:13 AM

## 2018-08-11 NOTE — Progress Notes (Signed)
PROGRESS NOTE    [name] Custer Sr.  TZG:[id number] DOB: 1976-07-18 DOA: 08/04/2018 PCP: Patient, [name] Per     Brief Narrative:  [name] Francois a 42 y.o.malewithhistory of CAD status post CABG, ESRD on hemodialysis Monday Wednesday Friday, diabetes mellitus type 2, peripheral vascular disease status post amputation of multiple toes, hypertension presents to the ER because of increasing pain in the right foot with discoloration of his right foot small toe. Patient symptoms started 10 days ago which has been progressively worsening. MRI positive for osteomyelitis of 4th and 5th digits. Underwent right lower extremity arteriogram with peroneal atherectomy with subsequent calf hematoma which required evacuation and 4 compartment fasciotomy on 12/23.  New events last 24 hours / Subjective: Underwent surgery yesterday, no complaints today.  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 S/p right fifth toe amputation with fasciotomy closure 12/26 Pain control Was treated with Vanco/zosyn, transition to Augmentin Blood cultures negative to date   PAD S/p RLE arteriogram with peroneal atherectomy with subsequent calf hematoma evacuation and 4 compartment fasciotomy 12/23  Aspirin, plavix  Acute blood loss anemia on chronic anemia of chronic kidney disease 3u pRBC transfused  Monitor CBC   HLD Continue lipitor   HTN Continue coreg   ESRD  Nephrology following  DM with hyperglycemia Lantus, SSI  Mood disorder Continue  lamictal, zoloft   Hyponatremia Monitor BMP    DVT prophylaxis: subq hep Code Status: Full Family Communication: No family at bedside Disposition Plan: Pending postop course, PT OT consulted today   Consultants:   Vascular surgery  Nephrology  Orthopedic surgery   Procedures:   S/p RLE arteriogram with peroneal atherectomy with subsequent calf hematoma evac and 4 compartment fasciotomy Dr. Donnetta Hutching 12/23  S/p right fifth toe amputation with fasciotomy closure 12/26  Antimicrobials:  Anti-infectives (From admission, onward)   Start     Dose/Rate Route Frequency Ordered Stop   08/11/18 1315  amoxicillin-clavulanate (AUGMENTIN) 875-125 MG per tablet 1 tablet     1 tablet Oral Every 12 hours 08/11/18 1309     08/11/18 1200  vancomycin (VANCOCIN) IVPB 1000 mg/200 mL premix  Status:  Discontinued     1,000 mg 200 mL/hr over 60 Minutes Intravenous Every M-W-F (Hemodialysis) 08/07/18 0843 08/10/18 1419   08/10/18 1800  vancomycin (VANCOCIN) IVPB 1000 mg/200 mL premix  Status:  Discontinued     1,000 mg 200 mL/hr over 60 Minutes Intravenous Every T-Th-Sa (Hemodialysis) 08/10/18 1419 08/11/18 1308   08/10/18 0500  vancomycin (VANCOCIN) IVPB 1000 mg/200 mL premix     1,000 mg 200 mL/hr over 60 Minutes Intravenous  Once 08/10/18 0401 08/10/18 0637   08/08/18 1200  vancomycin (VANCOCIN) IVPB 1000 mg/200 mL premix  Status:  Discontinued     1,000 mg 200 mL/hr over 60 Minutes Intravenous Every Tue (Hemodialysis) 08/07/18 0843 08/10/18 1414   08/07/18 1200  vancomycin (VANCOCIN) IVPB 1000 mg/200 mL premix  Status:  Discontinued     1,000 mg 200 mL/hr  over 60 Minutes Intravenous Every M-W-F (Hemodialysis) 08/05/18 0349 08/07/18 0843   08/05/18 1000  piperacillin-tazobactam (ZOSYN) IVPB 3.375 g  Status:  Discontinued     3.375 g 12.5 mL/hr over 240 Minutes Intravenous Every 12 hours 08/05/18 0343 08/11/18 1308   08/05/18 0345  vancomycin (VANCOCIN) IVPB 1000 mg/200 mL premix     1,000  mg 200 mL/hr over 60 Minutes Intravenous  Once 08/05/18 0340 08/05/18 0553   08/04/18 2030  piperacillin-tazobactam (ZOSYN) IVPB 3.375 g     3.375 g 100 mL/hr over 30 Minutes Intravenous  Once 08/04/18 2024 08/04/18 2247   08/04/18 2030  vancomycin (VANCOCIN) IVPB 1000 mg/200 mL premix     1,000 mg 200 mL/hr over 60 Minutes Intravenous  Once 08/04/18 2028 08/04/18 2353       Objective: Vitals:   08/10/18 2014 08/11/18 0331 08/11/18 0500 08/11/18 1124  BP: (!) 137/48 (!) 159/50  (!) 158/44  Pulse:    98  Resp: 14 13  19   Temp: 98.8 F (37.1 C) 99.1 F (37.3 C)  98.4 F (36.9 C)  TempSrc: Oral Oral  Oral  SpO2: 98% 97%  100%  Weight:   84.1 kg   Height:        Intake/Output Summary (Last 24 hours) at 08/11/2018 1311 Last data filed at 08/10/2018 1500 Gross per 24 hour  Intake 42.5 ml  Output 850 ml  Net -807.5 ml   Filed Weights   08/10/18 0953 08/10/18 1042 08/11/18 0500  Weight: 83 kg 83 kg 84.1 kg      Examination: General exam: Appears calm and comfortable  Respiratory system: Clear to auscultation. Respiratory effort normal. Cardiovascular system: S1 & S2 heard, RRR. No JVD, murmurs, rubs, gallops or clicks. No pedal edema. Gastrointestinal system: Abdomen is nondistended, soft and nontender. No organomegaly or masses felt. Normal bowel sounds heard. Central nervous system: Alert and oriented. No focal neurological deficits. Extremities:+ Left BKA, right calf and right foot dressed in clean and dry dressing Psychiatry: Judgement and insight appear normal. Mood & affect appropriate.     Data Reviewed: I have personally reviewed following labs and imaging studies  CBC: Recent Labs  Lab 08/04/18 2035  08/07/18 0506 08/08/18 0251 08/09/18 0320 08/10/18 0303 08/10/18 1103 08/11/18 0348  WBC 8.8   < > 10.5 16.1* 13.3* 13.8*  --  14.9*  NEUTROABS 6.1  --   --   --   --   --   --   --   HGB 13.4   < > 13.2 10.1* 8.0* 5.7* 9.9* 8.1*  HCT 42.6   < > 40.9  30.8* 24.5* 18.1* 29.0* 25.1*  MCV 86.4   < > 87.2 84.6 86.3 85.8  --  86.9  PLT 423*   < > 483* 478* 402* 355  --  344   < > = values in this interval not displayed.   Basic Metabolic Panel: Recent Labs  Lab 08/07/18 0506 08/08/18 0251 08/09/18 0320 08/10/18 0303 08/10/18 1103 08/11/18 0348  NA 130* 134* 131* 128* 131* 132*  K 4.4 4.2 3.9 3.9 4.3 4.4  CL 92* 93* 90* 89*  --  91*  CO2 25 20* 29 27  --  27  GLUCOSE 201* 200* 265* 295* 129* 297*  BUN 33* 44* 26* 37*  --  30*  CREATININE 7.14* 9.78* 6.76* 9.55*  --  7.89*  CALCIUM 8.7* 8.6* 8.1* 8.2*  --  8.1*  PHOS  --   --   --  5.2*  --  4.9*   GFR: Estimated Creatinine Clearance: 14.3 mL/min (A) (by C-G formula based on SCr of 7.89 mg/dL (H)). Liver Function Tests: Recent Labs  Lab 08/04/18 2035 08/10/18 0303 08/11/18 0348  AST 13*  --   --   ALT 12  --   --   ALKPHOS 54  --   --   BILITOT 0.7  --   --   PROT 8.0  --   --   ALBUMIN 2.8* 2.2* 2.2*   No results for input(s): LIPASE, AMYLASE in the last 168 hours. No results for input(s): AMMONIA in the last 168 hours. Coagulation Profile: No results for input(s): INR, PROTIME in the last 168 hours. Cardiac Enzymes: Recent Labs  Lab 08/04/18 2035  TROPONINI 0.03*   BNP (last 3 results) No results for input(s): PROBNP in the last 8760 hours. HbA1C: No results for input(s): HGBA1C in the last 72 hours. CBG: Recent Labs  Lab 08/10/18 1249 08/10/18 1705 08/10/18 2119 08/11/18 0612 08/11/18 1127  GLUCAP 112* 212* 333* 246* 154*   Lipid Profile: No results for input(s): CHOL, HDL, LDLCALC, TRIG, CHOLHDL, LDLDIRECT in the last 72 hours. Thyroid Function Tests: No results for input(s): TSH, T4TOTAL, FREET4, T3FREE, THYROIDAB in the last 72 hours. Anemia Panel: No results for input(s): VITAMINB12, FOLATE, FERRITIN, TIBC, IRON, RETICCTPCT in the last 72 hours. Sepsis Labs: Recent Labs  Lab 08/04/18 2035  LATICACIDVEN 1.8    Recent Results (from the past  240 hour(s))  Blood culture (routine x 2)     Status: None   Collection Time: 08/04/18  8:35 PM  Result Value Ref Range Status   Specimen Description BLOOD RIGHT HAND  Final   Special Requests   Final    BOTTLES DRAWN AEROBIC AND ANAEROBIC Blood Culture adequate volume   Culture NO GROWTH 5 DAYS  Final   Report Status 08/09/2018 FINAL  Final  Blood culture (routine x 2)     Status: None   Collection Time: 08/04/18  9:30 PM  Result Value Ref Range Status   Specimen Description BLOOD RIGHT HAND  Final   Special Requests   Final    BOTTLES DRAWN AEROBIC AND ANAEROBIC Blood Culture adequate volume   Culture NO GROWTH 5 DAYS  Final   Report Status 08/09/2018 FINAL  Final  MRSA PCR Screening     Status: Abnormal   Collection Time: 08/05/18  2:08 AM  Result Value Ref Range Status   MRSA by PCR POSITIVE (A) NEGATIVE Final    Comment:        The GeneXpert MRSA Assay (FDA approved for NASAL specimens only), is one component of a comprehensive MRSA colonization surveillance program. It is not intended to diagnose MRSA infection nor to guide or monitor treatment for MRSA infections. RESULT CALLED TO, READ BACK BY AND VERIFIED WITHLendell Caprice RN [id number] 12/21/196 A BROWNING Performed at [facility], [place] [address] [place], [place] [address]   Aerobic/Anaerobic Culture (surgical/deep wound)     Status: None (Preliminary result)   Collection Time: 08/10/18 12:22 PM  Result Value Ref Range Status   Specimen Description TOE RIGHT  Final   Special Requests 5TH  Final   Gram Stain NO WBC SEEN NO ORGANISMS SEEN   Final   Culture   Final    CULTURE REINCUBATED FOR BETTER GROWTH Performed at [facility], [address], [place], [place] [address]    Report Status PENDING  Incomplete  Radiology Studies: No results found.    Scheduled Meds: . sodium chloride   Intravenous Once  . amoxicillin-clavulanate  1 tablet Oral Q12H  . aspirin EC  81 mg Oral Daily  .  atorvastatin  80 mg Oral q1800  . calcitRIOL  1.25 mcg Oral Q M,W,F-HD  . Chlorhexidine Gluconate Cloth  6 each Topical Q0600  . Chlorhexidine Gluconate Cloth  6 each Topical Q0600  . clopidogrel  75 mg Oral Daily  . darbepoetin (ARANESP) injection - DIALYSIS  200 mcg Intravenous Q Thu-HD  . heparin  5,000 Units Subcutaneous Q8H  . insulin aspart  0-5 Units Subcutaneous QHS  . insulin aspart  0-9 Units Subcutaneous TID WC  . insulin glargine  16 Units Subcutaneous QHS  . lamoTRIgine  150 mg Oral QHS  . lanthanum  2,000 mg Oral TID WC  . multivitamin  1 tablet Oral QHS  . mupirocin ointment  1 application Nasal BID  . sertraline  50 mg Oral Daily  . sodium chloride flush  3 mL Intravenous Q12H   Continuous Infusions: . sodium chloride 250 mL (08/05/18 2244)  . sodium chloride       LOS: 6 days    Time spent: 20 minutes   Dessa Phi, DO [facility] [email or web address] Password [email or web address] 08/11/2018, 1:11 PM

## 2018-08-12 LAB — RENAL FUNCTION PANEL
Albumin: 2.2 g/dL — ABNORMAL LOW (ref 3.5–5.0)
Anion gap: 15 (ref 5–15)
BUN: 37 mg/dL — ABNORMAL HIGH (ref 6–20)
CO2: 25 mmol/L (ref 22–32)
Calcium: 8.6 mg/dL — ABNORMAL LOW (ref 8.9–10.3)
Chloride: 89 mmol/L — ABNORMAL LOW (ref 98–111)
Creatinine, Ser: 9.73 mg/dL — ABNORMAL HIGH (ref 0.61–1.24)
GFR calc Af Amer: 7 mL/min — ABNORMAL LOW (ref >60)
GFR calc non Af Amer: 6 mL/min — ABNORMAL LOW (ref >60)
Glucose, Bld: 227 mg/dL — ABNORMAL HIGH (ref 70–99)
Phosphorus: 4.8 mg/dL — ABNORMAL HIGH (ref 2.5–4.6)
Potassium: 4.3 mmol/L (ref 3.5–5.1)
Sodium: 129 mmol/L — ABNORMAL LOW (ref 135–145)

## 2018-08-12 LAB — CBC
HCT: 24.1 % — ABNORMAL LOW (ref 39.0–52.0)
Hemoglobin: 7.8 g/dL — ABNORMAL LOW (ref 13.0–17.0)
MCH: 28.2 pg (ref 26.0–34.0)
MCHC: 32.4 g/dL (ref 30.0–36.0)
MCV: 87 fL (ref 80.0–100.0)
PLATELETS: 386 10*3/uL (ref 150–400)
RBC: 2.77 MIL/uL — ABNORMAL LOW (ref 4.22–5.81)
RDW: 13.7 % (ref 11.5–15.5)
WBC: 16.4 10*3/uL — AB (ref 4.0–10.5)
nRBC: 0 % (ref 0.0–0.2)

## 2018-08-12 LAB — GLUCOSE, CAPILLARY
Glucose-Capillary: 191 mg/dL — ABNORMAL HIGH (ref 70–99)
Glucose-Capillary: 192 mg/dL — ABNORMAL HIGH (ref 70–99)
Glucose-Capillary: 187 mg/dL — ABNORMAL HIGH (ref 70–99)

## 2018-08-12 MED ORDER — LIDOCAINE HCL (PF) 1 % IJ SOLN: 5.0000 mL | INTRAMUSCULAR | Status: DC | PRN

## 2018-08-12 MED ORDER — SODIUM CHLORIDE 0.9 % IV SOLN: 100.0000 mL | INTRAVENOUS | Status: DC | PRN

## 2018-08-12 MED ORDER — HEPARIN SODIUM (PORCINE) 1000 UNIT/ML DIALYSIS: 1000.0000 [IU] | INTRAMUSCULAR | Status: DC | PRN

## 2018-08-12 MED ORDER — HYDROMORPHONE HCL 1 MG/ML IJ SOLN
INTRAMUSCULAR | Status: AC
  Filled 2018-08-12: qty 1

## 2018-08-12 MED ORDER — LIDOCAINE-PRILOCAINE 2.5-2.5 % EX CREA: 1.0000 "application " | TOPICAL_CREAM | CUTANEOUS | Status: DC | PRN

## 2018-08-12 MED ORDER — AMOXICILLIN-POT CLAVULANATE 500-125 MG PO TABS
1.0000 | ORAL_TABLET | Freq: Every day | ORAL | Status: DC
  Administered 2018-08-13 – 2018-08-14 (×2): 500 mg via ORAL
  Filled 2018-08-12 (×2): qty 1

## 2018-08-12 MED ORDER — HYDROMORPHONE HCL 1 MG/ML IJ SOLN
INTRAMUSCULAR | Status: AC
  Administered 2018-08-12: 1 mg via INTRAVENOUS
  Filled 2018-08-12: qty 1

## 2018-08-12 MED ORDER — PENTAFLUOROPROP-TETRAFLUOROETH EX AERO: 1.0000 "application " | INHALATION_SPRAY | CUTANEOUS | Status: DC | PRN

## 2018-08-12 NOTE — Progress Notes (Signed)
Patient ID: [name] Schack Sr., male   DOB: 1975/08/18, 42 y.o.   MRN: [id number] Currently on hemodialysis.  Reports improvement in discomfort in his calf.  Calf and foot dressing intact.

## 2018-08-12 NOTE — Procedures (Signed)
I was present at this session.  I have reviewed the session itself and made appropriate changes.  HD via LUA AVF.  Flow 450 bp ^, remove volJeneen Rinks Vinay Ertl 12/28/20199:30 AM

## 2018-08-12 NOTE — Progress Notes (Signed)
PHARMACY NOTE:  ANTIMICROBIAL RENAL DOSAGE ADJUSTMENT  Current antimicrobial regimen includes a mismatch between antimicrobial dosage and estimated renal function.  As per policy approved by the Pharmacy & Therapeutics and Medical Executive Committees, the antimicrobial dosage will be adjusted accordingly.  Current antimicrobial dosage:  Augmentin 875-125 mg twice daily  Indication: R-toe gangrene  Renal Function:  Estimated Creatinine Clearance: 11.6 mL/min (A) (by C-G formula based on SCr of 9.73 mg/dL (H)). [x]      On intermittent HD []      On CRRT    Antimicrobial dosage has been changed to:  Augmentin 500-125 mg daily given HD patient  Additional comments: Received 875-125 mg dose after HD on 12/28, will resume new dosing on 12/29 (administering in evening post-HD)   Thank you for allowing pharmacy to be a part of this patient's care.  Antonietta Jewel, PharmD, [place] Clinical Pharmacist  Pager: [phone] Phone: [phone] 08/12/2018 2:07 PM

## 2018-08-12 NOTE — Evaluation (Signed)
Occupational Therapy Evaluation Patient Details Name: [name] Sassone Sr. MRN: [id number] DOB: 02-04-1976 Today's Date: 08/12/2018    History of Present Illness Pt adm with gangrene on rt foot. Pt underwent amputation of rt 5th toe and fasciotomy of rt leg. Closure rt lateral fasciotomy. PMH - lt bka, rt 1st and 2nd toe amputation, cabg, esrd on hd, htn, dm   Clinical Impression   PTA Pt mod I with DME and prosthetic, does not drive- has transport for HD. Pt lives in accessible apartment and has w/c he uses at times. Pt was limited by fatigued from HD and pain from R foot, but agreeable to perform lateral scoot transfer at mod I level. OT will follow acutely to ensure maximum independence in LB dressing and transfers. Do not anticipate OT follow up being necessary.     Follow Up Recommendations  No OT follow up    Equipment Recommendations  None recommended by OT(Pt has appropriate DME)    Recommendations for Other Services       Precautions / Restrictions Precautions Precautions: None Required Braces or Orthoses: Other Brace Other Brace: Pt with post op shoe on rt Restrictions Other Position/Activity Restrictions: assume weight bearing on rt heel only      Mobility Bed Mobility Overal bed mobility: Independent                Transfers                 General transfer comment: declined OOB post HD    Balance Overall balance assessment: Modified Independent(EOB)                                         ADL either performed or assessed with clinical judgement   ADL Overall ADL's : Needs assistance/impaired Eating/Feeding: Independent   Grooming: Wash/dry hands;Wash/dry face;Oral care;Set up;Sitting;Modified independent Grooming Details (indicate cue type and reason): EOB Upper Body Bathing: Set up;Sitting   Lower Body Bathing: Min guard;Sitting/lateral leans   Upper Body Dressing : Modified independent   Lower Body Dressing:  Supervision/safety;Sitting/lateral leans Lower Body Dressing Details (indicate cue type and reason): donned prosthetic Toilet Transfer: Supervision/safety   Toileting- Clothing Manipulation and Hygiene: Supervision/safety         General ADL Comments: suspect close to baseline - limited by pain and fatigue very poliet     Vision Patient Visual Report: No change from baseline       Perception     Praxis      Pertinent Vitals/Pain Pain Assessment: Faces Faces Pain Scale: Hurts even more Pain Location: RLE Pain Descriptors / Indicators: Guarding;Grimacing Pain Intervention(s): Limited activity within patient's tolerance;Monitored during session;Repositioned     Hand Dominance     Extremity/Trunk Assessment Upper Extremity Assessment Upper Extremity Assessment: Overall WFL for tasks assessed   Lower Extremity Assessment Lower Extremity Assessment: RLE deficits/detail;LLE deficits/detail RLE Deficits / Details: Limited by pain. Multiple toe amputations LLE Deficits / Details: BKA       Communication Communication Communication: No difficulties   Cognition Arousal/Alertness: Awake/alert Behavior During Therapy: WFL for tasks assessed/performed Overall Cognitive Status: Within Functional Limits for tasks assessed                                     General Comments  Exercises     Shoulder Instructions      Home Living Family/patient expects to be discharged to:: Private residence Living Arrangements: Spouse/significant other Available Help at Discharge: Family Type of Home: Apartment Home Access: Level entry     Home Layout: One level     Bathroom Shower/Tub: Teacher, early years/pre: Standard     Home Equipment: Grab bars - toilet;Grab bars - tub/shower;Wheelchair - [employer] - 2 wheels;Crutches          Prior Functioning/Environment Level of Independence: Independent with assistive device(s)         Comments: amb with prosthetic. Uses w/c as needed. Takes transportation to get to HD        OT Problem List: Decreased activity tolerance;Impaired balance (sitting and/or standing);Decreased knowledge of precautions;Pain      OT Treatment/Interventions: Self-care/ADL training;DME and/or AE instruction;Therapeutic activities;Patient/family education;Balance training    OT Goals(Current goals can be found in the care plan section) Acute Rehab OT Goals Patient Stated Goal: return home OT Goal Formulation: With patient Time For Goal Achievement: 08/26/18 Potential to Achieve Goals: Good ADL Goals Pt Will Perform Lower Body Dressing: with modified independence;sitting/lateral leans;sit to/from stand Pt Will Transfer to Toilet: with supervision;ambulating Pt Will Perform Toileting - Clothing Manipulation and hygiene: with modified independence;sitting/lateral leans  OT Frequency: Min 2X/week   Barriers to D/C:            Co-evaluation              AM-PAC OT "6 Clicks" Daily Activity     Outcome Measure Help from another person eating meals?: None Help from another person taking care of personal grooming?: A Little Help from another person toileting, which includes using toliet, bedpan, or urinal?: None Help from another person bathing (including washing, rinsing, drying)?: None Help from another person to put on and taking off regular upper body clothing?: None Help from another person to put on and taking off regular lower body clothing?: A Little 6 Click Score: 22   End of Session Equipment Utilized During Treatment: Gait belt;Other (comment)(prosthetic) Nurse Communication: Mobility status  Activity Tolerance: Patient tolerated treatment well Patient left: in bed;with call bell/phone within reach  OT Visit Diagnosis: Other abnormalities of gait and mobility (R26.89);Pain Pain - Right/Left: Right Pain - part of body: Ankle and joints of foot                Time:  6979-4801 OT Time Calculation (min): 22 min Charges:  OT General Charges $OT Visit: 1 Visit OT Evaluation $OT Eval Moderate Complexity: [place] OTR/L Acute Rehabilitation Services Pager: [phone] Office: [place] 08/12/2018, 6:03 PM

## 2018-08-12 NOTE — Progress Notes (Signed)
PROGRESS NOTE  [name] Dollins Sr. GGY:[id number] DOB: 1975/11/06 DOA: 08/04/2018 PCP: Patient, [name] Per  HPI/Recap of past 24 hours: [name] a 42 y.o.malewithhistory of CAD status post CABG, ESRD on hemodialysis Monday Wednesday Friday, diabetes mellitus type 2, peripheral vascular disease status post amputation of multiple toes, hypertension presents to the ER because of increasing pain in the right foot with discoloration of his right foot small toe. Patient symptoms started 10 days ago which has been progressively worsening.MRI positive for osteomyelitis of 4th and 5th digits. Underwent right lower extremity arteriogram with peroneal atherectomy with subsequent calf hematoma which required evacuation and 4 compartment fasciotomy on 12/23.  08/12/2018: Patient seen and examined at dialysis center.  No acute events overnight.  No new complaints.   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 S/p right fifth toe amputation with fasciotomy closure 12/26 Pain control Was treated with Vanco/zosyn, transition to Augmentin Blood cultures negative to date  Continue pain control and IV antibiotics  PAD S/p RLE arteriogram with peroneal atherectomy with subsequent calf hematoma evacuation and 4 compartment fasciotomy 12/23  Aspirin, plavix  Acute blood loss anemia on chronic anemia of chronic kidney disease 3u pRBC transfused  Monitor CBC   HLD Continue lipitor   HTN Continue coreg   ESRD on dialysis Tuesday  Thursday Saturday Nephrology following Hemodialysis today 08/12/2018  DM with hyperglycemia Lantus, SSI  Mood disorder Continue lamictal, zoloft   Hyponatremia Monitor BMP    DVT prophylaxis: subq hep Code Status: Full Family Communication: No family at bedside Disposition Plan:  To home when vascular surgery and nephrology sign off.   Consultants:   Vascular surgery  Nephrology  Orthopedic surgery   Procedures:   S/p RLE arteriogram with peroneal atherectomy with subsequent calf hematoma evac and 4 compartment fasciotomy Dr. Donnetta Hutching 12/23  S/p right fifth toe amputation with fasciotomy closure 12/26     Objective: Vitals:   08/12/18 1111 08/12/18 1129 08/12/18 1211 08/12/18 1613  BP: 134/75 122/71 (!) 144/59 (!) 129/54  Pulse: 99 94 (!) 101 100  Resp:  12 15 18   Temp:  98 F (36.7 C) 98.4 F (36.9 C) 99 F (37.2 C)  TempSrc:  Oral Oral Oral  SpO2:  98% 98% 97%  Weight:  82.1 kg    Height:        Intake/Output Summary (Last 24 hours) at 08/12/2018 1631 Last data filed at 08/12/2018 1300 Gross per 24 hour  Intake 120 ml  Output 3000 ml  Net -2880 ml   Filed Weights   08/12/18 0450 08/12/18 0720 08/12/18 1129  Weight: 91.9 kg 86.9 kg 82.1 kg    Exam:  . General: 42 y.o. year-old male well developed well nourished in no acute distress.  Alert and oriented x3. . Cardiovascular: Regular rate and rhythm with no rubs or gallops.  No thyromegaly or JVD noted.   Marland Kitchen Respiratory: Clear to auscultation with no wheezes or rales. Good inspiratory effort. . Abdomen: Soft nontender nondistended with normal  bowel sounds x4 quadrants. . Psychiatry: Mood is appropriate for condition and setting   Data Reviewed: CBC: Recent Labs  Lab 08/08/18 0251 08/09/18 0320 08/10/18 0303 08/10/18 1103 08/11/18 0348 08/12/18 0313  WBC 16.1* 13.3* 13.8*  --  14.9* 16.4*  HGB 10.1* 8.0* 5.7* 9.9* 8.1* 7.8*  HCT 30.8* 24.5* 18.1* 29.0* 25.1* 24.1*  MCV 84.6  86.3 85.8  --  86.9 87.0  PLT 478* 402* 355  --  344 785   Basic Metabolic Panel: Recent Labs  Lab 08/08/18 0251 08/09/18 0320 08/10/18 0303 08/10/18 1103 08/11/18 0348 08/12/18 0313  NA 134* 131* 128* 131* 132* 129*  K 4.2 3.9 3.9 4.3 4.4 4.3  CL 93* 90* 89*  --  91* 89*  CO2 20* 29 27  --  27 25  GLUCOSE 200* 265* 295* 129* 297* 227*  BUN 44* 26* 37*  --  30* 37*  CREATININE 9.78* 6.76* 9.55*  --  7.89* 9.73*  CALCIUM 8.6* 8.1* 8.2*  --  8.1* 8.6*  PHOS  --   --  5.2*  --  4.9* 4.8*   GFR: Estimated Creatinine Clearance: 11.6 mL/min (A) (by C-G formula based on SCr of 9.73 mg/dL (H)). Liver Function Tests: Recent Labs  Lab 08/10/18 0303 08/11/18 0348 08/12/18 0313  ALBUMIN 2.2* 2.2* 2.2*   No results for input(s): LIPASE, AMYLASE in the last 168 hours. No results for input(s): AMMONIA in the last 168 hours. Coagulation Profile: No results for input(s): INR, PROTIME in the last 168 hours. Cardiac Enzymes: No results for input(s): CKTOTAL, CKMB, CKMBINDEX, TROPONINI in the last 168 hours. BNP (last 3 results) No results for input(s): PROBNP in the last 8760 hours. HbA1C: No results for input(s): HGBA1C in the last 72 hours. CBG: Recent Labs  Lab 08/11/18 1127 08/11/18 1618 08/11/18 2011 08/12/18 0609 08/12/18 1611  GLUCAP 154* 186* 253* 191* 192*   Lipid Profile: No results for input(s): CHOL, HDL, LDLCALC, TRIG, CHOLHDL, LDLDIRECT in the last 72 hours. Thyroid Function Tests: No results for input(s): TSH, T4TOTAL, FREET4, T3FREE, THYROIDAB in the last 72 hours. Anemia Panel: No results for input(s): VITAMINB12, FOLATE, FERRITIN, TIBC, IRON, RETICCTPCT in the last 72 hours. Urine analysis:    Component Value Date/Time   COLORURINE RED BIOCHEMICALS MAY BE AFFECTED BY COLOR (A) 01/29/2010 0939   APPEARANCEUR CLOUDY (A) 01/29/2010 0939   LABSPEC 1.020 01/29/2010 0939   PHURINE 8.0 01/29/2010 0939   GLUCOSEU NEGATIVE 01/29/2010 0939   HGBUR LARGE (A)  01/29/2010 0939   BILIRUBINUR MODERATE (A) 01/29/2010 0939   KETONESUR 15 (A) 01/29/2010 0939   PROTEINUR >300 (A) 01/29/2010 0939   UROBILINOGEN 0.2 01/29/2010 0939   NITRITE POSITIVE (A) 01/29/2010 0939   LEUKOCYTESUR LARGE (A) 01/29/2010 0939   Sepsis Labs: @LABRCNTIP (procalcitonin:4,lacticidven:4)  ) Recent Results (from the past 240 hour(s))  Blood culture (routine x 2)     Status: None   Collection Time: 08/04/18  8:35 PM  Result Value Ref Range Status   Specimen Description BLOOD RIGHT HAND  Final   Special Requests   Final    BOTTLES DRAWN AEROBIC AND ANAEROBIC Blood Culture adequate volume   Culture NO GROWTH 5 DAYS  Final   Report Status 08/09/2018 FINAL  Final  Blood culture (routine x 2)     Status: None   Collection Time: 08/04/18  9:30 PM  Result Value Ref Range Status   Specimen Description BLOOD RIGHT HAND  Final   Special Requests   Final  BOTTLES DRAWN AEROBIC AND ANAEROBIC Blood Culture adequate volume   Culture NO GROWTH 5 DAYS  Final   Report Status 08/09/2018 FINAL  Final  MRSA PCR Screening     Status: Abnormal   Collection Time: 08/05/18  2:08 AM  Result Value Ref Range Status   MRSA by PCR POSITIVE (A) NEGATIVE Final    Comment:        The GeneXpert MRSA Assay (FDA approved for NASAL specimens only), is one component of a comprehensive MRSA colonization surveillance program. It is not intended to diagnose MRSA infection nor to guide or monitor treatment for MRSA infections. RESULT CALLED TO, READ BACK BY AND VERIFIED WITHLendell Caprice RN [id number] 12/21/196 A BROWNING Performed at [facility], [place] [address], [place], [place] [address]   Aerobic/Anaerobic Culture (surgical/deep wound)     Status: None (Preliminary result)   Collection Time: 08/10/18 12:22 PM  Result Value Ref Range Status   Specimen Description TOE RIGHT  Final   Special Requests 5TH  Final   Gram Stain   Final    NO WBC SEEN NO ORGANISMS SEEN Performed at [facility], [address] [place], [place] [address]    Culture   Final    FEW GRAM NEGATIVE RODS RARE STAPHYLOCOCCUS AUREUS IDENTIFICATION AND SUSCEPTIBILITIES TO FOLLOW FOR ORGANISM 1 SUSCEPTIBILITIES TO FOLLOW FOR ORGANISM 2 NO ANAEROBES ISOLATED; CULTURE IN PROGRESS FOR 5 DAYS    Report Status PENDING  Incomplete      Studies: No results found.  Scheduled Meds: . sodium chloride   Intravenous Once  . [START ON 08/13/2018] amoxicillin-clavulanate  1 tablet Oral q1800  . aspirin EC  81 mg Oral Daily  . atorvastatin  80 mg Oral q1800  . calcitRIOL  1.25 mcg Oral Q M,W,F-HD  . Chlorhexidine Gluconate Cloth  6 each Topical Q0600  . Chlorhexidine Gluconate Cloth  6 each Topical Q0600  . clopidogrel  75 mg Oral Daily  . darbepoetin (ARANESP) injection - DIALYSIS  200 mcg Intravenous Q Thu-HD  . heparin  5,000 Units Subcutaneous Q8H  . insulin aspart  0-5 Units Subcutaneous QHS  . insulin aspart  0-9 Units Subcutaneous TID WC  . insulin glargine  16 Units Subcutaneous QHS  . lamoTRIgine  150 mg Oral QHS  . lanthanum  2,000 mg Oral TID WC  . multivitamin  1 tablet Oral QHS  . mupirocin ointment  1 application Nasal BID  . sertraline  50 mg Oral Daily  . sodium chloride flush  3 mL Intravenous Q12H    Continuous Infusions: . sodium chloride 250 mL (08/05/18 2244)  . sodium chloride       LOS: 7 days     Kayleen Memos, MD [facility] Pager [phone]  If 7PM-7AM, please contact night-coverage [email or web address] Password [facility] 08/12/2018, 4:31 PM

## 2018-08-12 NOTE — Progress Notes (Signed)
OT Cancellation Note  Patient Details Name: [name] Nyland Sr. MRN: [id number] DOB: [birth year]   Cancelled Treatment:    Reason Eval/Treat Not Completed: Patient at procedure or test/ unavailable(HD)  [name] [name] 08/12/2018, 7:41 AM   Hulda Humphrey OTR/L [place] Pager: [phone] Office: [phone]

## 2018-08-12 NOTE — Progress Notes (Signed)
Subjective: Interval History: has no complaint pain improving.  Objective: Vital signs in last 24 hours: Temp:  [98.2 F (36.8 C)-99.2 F (37.3 C)] 98.2 F (36.8 C) (12/28 0720) Pulse Rate:  [84-103] 103 (12/28 0915) Resp:  [10-22] 22 (12/28 0800) BP: (137-180)/(40-92) 180/40 (12/28 0915) SpO2:  [96 %-100 %] 98 % (12/28 0720) Weight:  [86.9 kg-91.9 kg] 86.9 kg (12/28 0720) Weight change: 8.553 kg  Intake/Output from previous day: No intake/output data recorded. Intake/Output this shift: No intake/output data recorded.  General appearance: alert, cooperative and no distress Resp: clear to auscultation bilaterally Cardio: S1, S2 normal and systolic murmur: systolic ejection 2/6, crescendo and decrescendo at 2nd left intercostal space GI: soft, pos bs, liver down 6 cm Extremities: aVF LUA, dressing L foot  Lab Results: Recent Labs    08/11/18 0348 08/12/18 0313  WBC 14.9* 16.4*  HGB 8.1* 7.8*  HCT 25.1* 24.1*  PLT 344 386   BMET:  Recent Labs    08/11/18 0348 08/12/18 0313  NA 132* 129*  K 4.4 4.3  CL 91* 89*  CO2 27 25  GLUCOSE 297* 227*  BUN 30* 37*  CREATININE 7.89* 9.73*  CALCIUM 8.1* 8.6*   No results for input(s): PTH in the last 72 hours. Iron Studies: No results for input(s): IRON, TIBC, TRANSFERRIN, FERRITIN in the last 72 hours.  Studies/Results: No results found.  I have reviewed the patient's current medications.  Assessment/Plan: 1 ESRD for HD 2 Anemia will maximize esa 3 HPTH vit D, cinn 4 DM needs better control 5 CAD 6 PVD 7 NONADHERENCE with HD, DM control ,fluid P HD, esa, pain control, counseled,     LOS: 7 days   [name] Niasia Lanphear 08/12/2018,9:30 AM

## 2018-08-13 LAB — CBC
HEMATOCRIT: 25.2 % — AB (ref 39.0–52.0)
Hemoglobin: 8.1 g/dL — ABNORMAL LOW (ref 13.0–17.0)
MCH: 28.6 pg (ref 26.0–34.0)
MCHC: 32.1 g/dL (ref 30.0–36.0)
MCV: 89 fL (ref 80.0–100.0)
Platelets: 477 10*3/uL — ABNORMAL HIGH (ref 150–400)
RBC: 2.83 MIL/uL — AB (ref 4.22–5.81)
RDW: 14.2 % (ref 11.5–15.5)
WBC: 14.6 10*3/uL — ABNORMAL HIGH (ref 4.0–10.5)
nRBC: 0 % (ref 0.0–0.2)

## 2018-08-13 LAB — RENAL FUNCTION PANEL
Albumin: 2.3 g/dL — ABNORMAL LOW (ref 3.5–5.0)
Anion gap: 12 (ref 5–15)
BUN: 24 mg/dL — ABNORMAL HIGH (ref 6–20)
CO2: 27 mmol/L (ref 22–32)
Calcium: 8.4 mg/dL — ABNORMAL LOW (ref 8.9–10.3)
Chloride: 94 mmol/L — ABNORMAL LOW (ref 98–111)
Creatinine, Ser: 6.41 mg/dL — ABNORMAL HIGH (ref 0.61–1.24)
GFR calc Af Amer: 11 mL/min — ABNORMAL LOW (ref >60)
GFR calc non Af Amer: 10 mL/min — ABNORMAL LOW (ref >60)
Glucose, Bld: 144 mg/dL — ABNORMAL HIGH (ref 70–99)
POTASSIUM: 4.3 mmol/L (ref 3.5–5.1)
Phosphorus: 4.4 mg/dL (ref 2.5–4.6)
Sodium: 133 mmol/L — ABNORMAL LOW (ref 135–145)

## 2018-08-13 LAB — GLUCOSE, CAPILLARY
GLUCOSE-CAPILLARY: 288 mg/dL — AB (ref 70–99)
Glucose-Capillary: 120 mg/dL — ABNORMAL HIGH (ref 70–99)
Glucose-Capillary: 227 mg/dL — ABNORMAL HIGH (ref 70–99)

## 2018-08-13 MED ORDER — LIDOCAINE-PRILOCAINE 2.5-2.5 % EX CREA: 1.0000 "application " | TOPICAL_CREAM | CUTANEOUS | Status: DC | PRN

## 2018-08-13 MED ORDER — PENTAFLUOROPROP-TETRAFLUOROETH EX AERO: 1.0000 "application " | INHALATION_SPRAY | CUTANEOUS | Status: DC | PRN

## 2018-08-13 MED ORDER — SODIUM CHLORIDE 0.9 % IV SOLN: 100.0000 mL | INTRAVENOUS | Status: DC | PRN

## 2018-08-13 MED ORDER — HEPARIN SODIUM (PORCINE) 5000 UNIT/ML IJ SOLN
5000.0000 [IU] | Freq: Three times a day (TID) | INTRAMUSCULAR | Status: DC
  Administered 2018-08-13 – 2018-08-17 (×13): 5000 [IU] via SUBCUTANEOUS
  Filled 2018-08-13 (×14): qty 1

## 2018-08-13 MED ORDER — ALTEPLASE 2 MG IJ SOLR: 2.0000 mg | Freq: Once | INTRAMUSCULAR | Status: DC | PRN

## 2018-08-13 MED ORDER — OXYCODONE-ACETAMINOPHEN 5-325 MG PO TABS
1.0000 | ORAL_TABLET | Freq: Four times a day (QID) | ORAL | Status: DC | PRN
  Administered 2018-08-13 – 2018-08-17 (×13): 2 via ORAL
  Filled 2018-08-13 (×13): qty 2

## 2018-08-13 MED ORDER — CARVEDILOL 3.125 MG PO TABS
3.1250 mg | ORAL_TABLET | Freq: Every day | ORAL | Status: DC
  Administered 2018-08-13 – 2018-08-17 (×5): 3.125 mg via ORAL
  Filled 2018-08-13 (×5): qty 1

## 2018-08-13 MED ORDER — HEPARIN SODIUM (PORCINE) 1000 UNIT/ML DIALYSIS: 1000.0000 [IU] | INTRAMUSCULAR | Status: DC | PRN

## 2018-08-13 MED ORDER — LIDOCAINE HCL (PF) 1 % IJ SOLN: 5.0000 mL | INTRAMUSCULAR | Status: DC | PRN

## 2018-08-13 MED ORDER — PRO-STAT SUGAR FREE PO LIQD
30.0000 mL | Freq: Two times a day (BID) | ORAL | Status: DC
  Administered 2018-08-13 – 2018-08-17 (×5): 30 mL via ORAL
  Filled 2018-08-13 (×7): qty 30

## 2018-08-13 NOTE — Progress Notes (Addendum)
PROGRESS NOTE  [name] Guggenheim Sr. EHU:[id number] DOB: 21-Apr-1976 DOA: 08/04/2018 PCP: Patient, [name] Per  HPI/Recap of past 24 hours: [name] [name] a 42 y.o.malewithhistory of CAD status post CABG, ESRD on hemodialysis Monday Wednesday Friday, diabetes mellitus type 2, peripheral vascular disease status post amputation of multiple toes, hypertension presents to the ER because of increasing pain in the right foot with discoloration of his right foot small toe. Patient symptoms started 10 days ago which has been progressively worsening.MRI positive for osteomyelitis of 4th and 5th digits. Underwent right lower extremity arteriogram with peroneal atherectomy with subsequent calf hematoma which required evacuation and 4 compartment fasciotomy on 12/23.  08/13/2018: Patient seen and examined at bedside.  No acute events overnight.  Reports pain in his left foot.  As needed pain medications in place.   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 S/p right fifth toe amputation with fasciotomy closure 12/26 Pain management in place Percocet every 6 hours as needed for moderate pain and on IV Dilaudid 1 mg as needed every 3 hours for severe pain Was treated with Vanco/zosyn, transition to Augmentin Blood cultures negative to date  WBC trending down Continue pain control and IV antibiotics Dressings will be changed tomorrow by orthopedic surgery Continue physical therapy as recommended by vascular surgery  PAD S/p RLE arteriogram with  peroneal atherectomy with subsequent calf hematoma evacuation and 4 compartment fasciotomy 12/23  Aspirin, plavix  Acute blood loss anemia on chronic anemia of chronic kidney disease 3u pRBC transfused  Monitor CBC   HLD Continue lipitor   HTN Continue coreg   ESRD on dialysis Monday Wednesday Friday Nephrology following Hemodialysis planned tomorrow to keep schedule  DM with hyperglycemia Lantus, SSI  Mood disorder Continue lamictal, zoloft   Hyponatremia Monitor BMP    DVT prophylaxis: subq hep Code Status: Full Family Communication: No family at bedside Disposition Plan:  To home when vascular surgery and nephrology sign off.   Consultants:   Vascular surgery  Nephrology  Orthopedic surgery   Procedures:   S/p RLE arteriogram with peroneal atherectomy with subsequent calf hematoma evac and 4 compartment fasciotomy Dr. Donnetta Hutching 12/23  S/p right fifth toe amputation with fasciotomy closure 12/26     Objective: Vitals:   08/12/18 1613 08/12/18 2004 08/13/18 0330 08/13/18 0344  BP: (!) 129/54 138/85  (!) 179/57  Pulse: 100 87    Resp: 18 15 13  (!) 8  Temp: 99 F (37.2 C) 98.9 F (37.2 C)  98.1 F (36.7 C)  TempSrc: Oral Oral  Oral  SpO2: 97% 98%  95%  Weight:   81.3 kg   Height:        Intake/Output Summary (Last 24 hours) at 08/13/2018 1542 Last data filed at 08/13/2018 0800 Gross per 24 hour  Intake 570 ml  Output -  Net 570 ml   Filed Weights   08/12/18 0720 08/12/18 1129 08/13/18 0330  Weight: 86.9 kg 82.1 kg 81.3 kg    Exam:  . General: 42  y.o. year-old male well-developed well-nourished no acute distress.  Alert and oriented x3. . Cardiovascular: Regular rate and rhythm with no rubs or gallops.  No JVD or thyromegaly noted. Marland Kitchen Respiratory: Clear to Auscultation with No Wheezes or Rales.  Good Inspiratory Effort. . Abdomen: Soft nontender nondistended with normal bowel sounds x4 quadrants. . Psychiatry: Mood is  appropriate for condition and setting   Data Reviewed: CBC: Recent Labs  Lab 08/09/18 0320 08/10/18 0303 08/10/18 1103 08/11/18 0348 08/12/18 0313 08/13/18 0328  WBC 13.3* 13.8*  --  14.9* 16.4* 14.6*  HGB 8.0* 5.7* 9.9* 8.1* 7.8* 8.1*  HCT 24.5* 18.1* 29.0* 25.1* 24.1* 25.2*  MCV 86.3 85.8  --  86.9 87.0 89.0  PLT 402* 355  --  344 386 102*   Basic Metabolic Panel: Recent Labs  Lab 08/09/18 0320 08/10/18 0303 08/10/18 1103 08/11/18 0348 08/12/18 0313 08/13/18 0328  NA 131* 128* 131* 132* 129* 133*  K 3.9 3.9 4.3 4.4 4.3 4.3  CL 90* 89*  --  91* 89* 94*  CO2 29 27  --  27 25 27   GLUCOSE 265* 295* 129* 297* 227* 144*  BUN 26* 37*  --  30* 37* 24*  CREATININE 6.76* 9.55*  --  7.89* 9.73* 6.41*  CALCIUM 8.1* 8.2*  --  8.1* 8.6* 8.4*  PHOS  --  5.2*  --  4.9* 4.8* 4.4   GFR: Estimated Creatinine Clearance: 17.4 mL/min (A) (by C-G formula based on SCr of 6.41 mg/dL (H)). Liver Function Tests: Recent Labs  Lab 08/10/18 0303 08/11/18 0348 08/12/18 0313 08/13/18 0328  ALBUMIN 2.2* 2.2* 2.2* 2.3*   No results for input(s): LIPASE, AMYLASE in the last 168 hours. No results for input(s): AMMONIA in the last 168 hours. Coagulation Profile: No results for input(s): INR, PROTIME in the last 168 hours. Cardiac Enzymes: No results for input(s): CKTOTAL, CKMB, CKMBINDEX, TROPONINI in the last 168 hours. BNP (last 3 results) No results for input(s): PROBNP in the last 8760 hours. HbA1C: No results for input(s): HGBA1C in the last 72 hours. CBG: Recent Labs  Lab 08/12/18 0609 08/12/18 1611 08/12/18 2054 08/13/18 0605 08/13/18 1115  GLUCAP 191* 192* 187* 120* 227*   Lipid Profile: No results for input(s): CHOL, HDL, LDLCALC, TRIG, CHOLHDL, LDLDIRECT in the last 72 hours. Thyroid Function Tests: No results for input(s): TSH, T4TOTAL, FREET4, T3FREE, THYROIDAB in the last 72 hours. Anemia Panel: No results for input(s): VITAMINB12, FOLATE, FERRITIN, TIBC, IRON,  RETICCTPCT in the last 72 hours. Urine analysis:    Component Value Date/Time   COLORURINE RED BIOCHEMICALS MAY BE AFFECTED BY COLOR (A) 01/29/2010 0939   APPEARANCEUR CLOUDY (A) 01/29/2010 0939   LABSPEC 1.020 01/29/2010 0939   PHURINE 8.0 01/29/2010 0939   GLUCOSEU NEGATIVE 01/29/2010 0939   HGBUR LARGE (A) 01/29/2010 0939   BILIRUBINUR MODERATE (A) 01/29/2010 0939   KETONESUR 15 (A) 01/29/2010 0939   PROTEINUR >300 (A) 01/29/2010 0939   UROBILINOGEN 0.2 01/29/2010 0939   NITRITE POSITIVE (A) 01/29/2010 0939   LEUKOCYTESUR LARGE (A) 01/29/2010 0939   Sepsis Labs: @LABRCNTIP (procalcitonin:4,lacticidven:4)  ) Recent Results (from the past 240 hour(s))  Blood culture (routine x 2)     Status: None   Collection Time: 08/04/18  8:35 PM  Result Value Ref Range Status   Specimen Description BLOOD RIGHT HAND  Final   Special Requests   Final    BOTTLES DRAWN AEROBIC AND ANAEROBIC Blood Culture adequate volume   Culture NO GROWTH 5 DAYS  Final   Report Status 08/09/2018 FINAL  Final  Blood culture (routine x 2)     Status: None   Collection Time: 08/04/18  9:30 PM  Result Value Ref Range Status   Specimen Description BLOOD RIGHT HAND  Final   Special Requests   Final    BOTTLES DRAWN AEROBIC AND ANAEROBIC Blood Culture adequate volume   Culture NO GROWTH 5 DAYS  Final   Report Status 08/09/2018 FINAL  Final  MRSA PCR Screening     Status: Abnormal   Collection Time: 08/05/18  2:08 AM  Result Value Ref Range Status   MRSA by PCR POSITIVE (A) NEGATIVE Final    Comment:        The GeneXpert MRSA Assay (FDA approved for NASAL specimens only), is one component of a comprehensive MRSA colonization surveillance program. It is not intended to diagnose MRSA infection nor to guide or monitor treatment for MRSA infections. RESULT CALLED TO, READ BACK BY AND VERIFIED WITHLendell Caprice RN 0630 12/21/196 A BROWNING Performed at [facility], [place] [address] [place], [place]  [address]   Aerobic/Anaerobic Culture (surgical/deep wound)     Status: None (Preliminary result)   Collection Time: 08/10/18 12:22 PM  Result Value Ref Range Status   Specimen Description TOE RIGHT  Final   Special Requests 5TH  Final   Gram Stain   Final    NO WBC SEEN NO ORGANISMS SEEN Performed at [facility], [address], [place], [place] [address]    Culture   Final    FEW CITROBACTER KOSERI RARE STAPHYLOCOCCUS AUREUS NO ANAEROBES ISOLATED; CULTURE IN PROGRESS FOR 5 DAYS    Report Status PENDING  Incomplete   Organism ID, Bacteria CITROBACTER KOSERI  Final      Susceptibility   Citrobacter koseri - MIC*    CEFAZOLIN <=4 SENSITIVE Sensitive     CEFEPIME <=1 SENSITIVE Sensitive     CEFTAZIDIME <=1 SENSITIVE Sensitive     CEFTRIAXONE <=1 SENSITIVE Sensitive     CIPROFLOXACIN <=0.25 SENSITIVE Sensitive     GENTAMICIN <=1 SENSITIVE Sensitive     IMIPENEM <=0.25 SENSITIVE Sensitive     TRIMETH/SULFA <=20 SENSITIVE Sensitive     PIP/TAZO <=4 SENSITIVE Sensitive     * FEW CITROBACTER KOSERI      Studies: No results found.  Scheduled Meds: . sodium chloride   Intravenous Once  . amoxicillin-clavulanate  1 tablet Oral q1800  . aspirin EC  81 mg Oral Daily  . atorvastatin  80 mg Oral q1800  . calcitRIOL  1.25 mcg Oral Q M,W,F-HD  . carvedilol  3.125 mg Oral Daily  . Chlorhexidine Gluconate Cloth  6 each Topical Q0600  . Chlorhexidine Gluconate Cloth  6 each Topical Q0600  . clopidogrel  75 mg Oral Daily  . darbepoetin (ARANESP) injection - DIALYSIS  200 mcg Intravenous Q Thu-HD  . feeding supplement (PRO-STAT SUGAR FREE 64)  30 mL Oral BID  . heparin  5,000 Units Subcutaneous Q8H  . insulin aspart  0-5 Units Subcutaneous QHS  . insulin aspart  0-9 Units Subcutaneous TID WC  . insulin glargine  16 Units Subcutaneous QHS  . lamoTRIgine  150 mg Oral QHS  . lanthanum  2,000 mg Oral TID WC  . multivitamin  1 tablet Oral QHS  . sertraline  50 mg Oral Daily  .  sodium chloride flush  3 mL Intravenous Q12H    Continuous Infusions: . sodium chloride    .  sodium chloride    . sodium chloride 250 mL (08/05/18 2244)  . sodium chloride       LOS: 8 days     Kayleen Memos, MD [facility] Pager [phone]  If 7PM-7AM, please contact night-coverage [email or web address] Password [facility] 08/13/2018, 3:42 PM

## 2018-08-13 NOTE — Progress Notes (Addendum)
  Progress Note    08/13/2018 7:33 AM 3 Days Post-Op  Subjective:  Says he has some soreness on the bottom of his foot from a corn, otherwise no complaints.  Foot feels better than before surgery.  Afebrile HR 80's-100's NSR 010'U-725'D systolic 66% RA  Vitals:   08/13/18 0330 08/13/18 0344  BP:  (!) 179/57  Pulse:    Resp: 13 (!) 8  Temp:  98.1 F (36.7 C)  SpO2:  95%    Physical Exam: General:  No distress Lungs:  Non labored Incisions:  Wrapped with ace wrap and bandage is clean and dry   CBC    Component Value Date/Time   WBC 14.6 (H) 08/13/2018 0328   RBC 2.83 (L) 08/13/2018 0328   HGB 8.1 (L) 08/13/2018 0328   HGB 14.3 02/07/2018 0937   HCT 25.2 (L) 08/13/2018 0328   HCT 42.8 02/07/2018 0937   PLT 477 (H) 08/13/2018 0328   PLT 369 02/07/2018 0937   MCV 89.0 08/13/2018 0328   MCV 82 02/07/2018 0937   MCH 28.6 08/13/2018 0328   MCHC 32.1 08/13/2018 0328   RDW 14.2 08/13/2018 0328   RDW 21.5 (H) 02/07/2018 0937   LYMPHSABS 1.7 08/04/2018 2035   MONOABS 0.7 08/04/2018 2035   EOSABS 0.2 08/04/2018 2035   BASOSABS 0.1 08/04/2018 2035    BMET    Component Value Date/Time   NA 133 (L) 08/13/2018 0328   NA 133 (L) 02/07/2018 0937   K 4.3 08/13/2018 0328   CL 94 (L) 08/13/2018 0328   CO2 27 08/13/2018 0328   GLUCOSE 144 (H) 08/13/2018 0328   GLUCOSE 262 03/06/2008   BUN 24 (H) 08/13/2018 0328   BUN 37 (H) 02/07/2018 0937   CREATININE 6.41 (H) 08/13/2018 0328   CALCIUM 8.4 (L) 08/13/2018 0328   GFRNONAA 10 (L) 08/13/2018 0328   GFRAA 11 (L) 08/13/2018 0328    INR    Component Value Date/Time   INR 1.25 11/04/2017 1450     Intake/Output Summary (Last 24 hours) at 08/13/2018 0733 Last data filed at 08/13/2018 0618 Gross per 24 hour  Intake 530 ml  Output 3000 ml  Net -2470 ml     Assessment:  42 y.o. male is s/p:  1. Right lower extremity lateral fasciotomy closure 2. Ray amputation of right fifth toe  3 Days  Post-Op  Plan: -dressing not removed today as it was changed a short bit ago.  RN reports mild old bloody drainage from lateral incision.  Will change with Dr. Carlis Abbott tomorrow. -leukocytosis contines to improve -pt does not have Darco shoe, only flat bottom-will order Darco shoe for pt to ambulate.  Continue PT -DVT prophylaxis:  Sq heparin   Leontine Locket, PA-C [facility] [phone] 08/13/2018 7:33 AM

## 2018-08-13 NOTE — Progress Notes (Signed)
Pt refused pm CBG and BP check. Will continue to monitor.  Amanda Cockayne, RN

## 2018-08-13 NOTE — Progress Notes (Signed)
Orthopedic Tech Progress Note Patient Details:  [name] Romey Sr. 03-Sep-1975 [id number]  Ortho Devices Type of Ortho Device: Darco shoe Ortho Device/Splint Interventions: Application   Post Interventions Patient Tolerated: Well Instructions Provided: Care of device   [place] Pink 08/13/2018, 10:48 AM

## 2018-08-13 NOTE — Progress Notes (Addendum)
[facility] Progress Note   Subjective: Sleeping, easily aroused. C/O Pain in L leg otherwise says he feels OK.      Objective Vitals:   08/12/18 1613 08/12/18 2004 08/13/18 0330 08/13/18 0344  BP: (!) 129/54 138/85  (!) 179/57  Pulse: 100 87    Resp: 18 15 13  (!) 8  Temp: 99 F (37.2 C) 98.9 F (37.2 C)  98.1 F (36.7 C)  TempSrc: Oral Oral  Oral  SpO2: 97% 98%  95%  Weight:   81.3 kg   Height:       Physical Exam General:Chronically ill appearing male in NAD Heart: S1,S2 RRR Lungs: CTAB A/P Abdomen: Active BS Extremities: Ace wrap RLE CDI. 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 CBC: Recent Labs  Lab 08/09/18 0320 08/10/18 0303  08/11/18 0348 08/12/18 0313 08/13/18 0328  WBC 13.3* 13.8*  --  14.9* 16.4* 14.6*  HGB 8.0* 5.7*   < > 8.1* 7.8* 8.1*  HCT 24.5* 18.1*   < > 25.1* 24.1* 25.2*  MCV 86.3 85.8  --  86.9 87.0 89.0  PLT 402* 355  --  344 386 477*   < > = values in this interval not displayed.   Blood Culture    Component Value Date/Time   SDES TOE RIGHT 08/10/2018 1222   SPECREQUEST 5TH 08/10/2018 1222   CULT  08/10/2018 1222    FEW CITROBACTER KOSERI RARE STAPHYLOCOCCUS AUREUS SUSCEPTIBILITIES TO FOLLOW NO ANAEROBES ISOLATED; CULTURE IN PROGRESS FOR 5 DAYS    REPTSTATUS PENDING 08/10/2018 1222    Cardiac Enzymes: No results for input(s): CKTOTAL, CKMB, CKMBINDEX, TROPONINI in the last 168 hours. CBG: Recent Labs  Lab 08/12/18 0609  08/12/18 1611 08/12/18 2054 08/13/18 0605 08/13/18 1115  GLUCAP 191* 192* 187* 120* 227*   Iron Studies: No results for input(s): IRON, TIBC, TRANSFERRIN, FERRITIN in the last 72 hours. @lablastinr3 @ Studies/Results: No results found. Medications: . sodium chloride    . sodium chloride    . sodium chloride 250 mL (08/05/18 2244)  . sodium chloride     . sodium chloride   Intravenous Once  . amoxicillin-clavulanate  1 tablet Oral q1800  . aspirin EC  81 mg Oral Daily  . atorvastatin  80 mg Oral q1800  . calcitRIOL  1.25 mcg Oral Q M,W,F-HD  . carvedilol  3.125 mg Oral Daily  . Chlorhexidine Gluconate Cloth  6 each Topical Q0600  . Chlorhexidine Gluconate Cloth  6 each Topical Q0600  . clopidogrel  75 mg Oral Daily  . darbepoetin (ARANESP) injection - DIALYSIS  200 mcg Intravenous Q Thu-HD  . heparin  5,000 Units Subcutaneous Q8H  . insulin aspart  0-5 Units Subcutaneous QHS  . insulin aspart  0-9 Units Subcutaneous TID WC  . insulin glargine  16 Units Subcutaneous QHS  . lamoTRIgine  150 mg Oral QHS  . lanthanum  2,000 mg Oral TID WC  .  multivitamin  1 tablet Oral QHS  . sertraline  50 mg Oral Daily  . sodium chloride flush  3 mL Intravenous Q12H   Dialysis Orders: MWF at [facility] 4hr, 200 dialyzer, 500/800, EDW 80kg, 2K/2Ca, UFP #2, AVF,  -Heparin 2400 units IV bolus -Calcitriol 1.69mcg PO TIW - No ESA (Last Hgb 14)  Assessment/Plan: 1. PAD- with right 5th toe gangrene s/p revascularization complicated by compartment syndrome requiring emergent fasciotomies and hematoma evacuation.  S/P RLE lateral fasciotomy closure/R ray 5th toe amputation PO day 3. On Augmentin-renal dosed.  2. ESRD continue with HD q MWF. HD tomorrow on schedule.  3. Anemia: s/p ABLA following surgery.S/P 3 units PRBCs. HGB 8.1. No ESA given yet. Given Aranesp 200 mcg IV with HD tomorrow. Follow HGB.  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  5. Nutrition: Albumin 2.4 renal diet, start prostat, on renal  vit.  6. Hypertension: BP and volume fairly well controlled. Wt today 81.3 UFG 2-2.5 liters tomorrow.  7. CAD s/p CABG- stable 8. DM type 2- per primary   [name] H. Brown NP-C 08/13/2018, 11:21 AM  [facility] [phone]  Pt seen and examined; agree with NP A&P. S/p rt ray 5th toe amputation.  No acute indication for dialysis today. paln on next HD tomorrow to resume MWF regimen.

## 2018-08-14 LAB — RENAL FUNCTION PANEL
Albumin: 2.3 g/dL — ABNORMAL LOW (ref 3.5–5.0)
Anion gap: 14 (ref 5–15)
BUN: 41 mg/dL — ABNORMAL HIGH (ref 6–20)
CO2: 25 mmol/L (ref 22–32)
Calcium: 8.5 mg/dL — ABNORMAL LOW (ref 8.9–10.3)
Chloride: 91 mmol/L — ABNORMAL LOW (ref 98–111)
Creatinine, Ser: 8.91 mg/dL — ABNORMAL HIGH (ref 0.61–1.24)
GFR calc Af Amer: 8 mL/min — ABNORMAL LOW (ref >60)
GFR calc non Af Amer: 7 mL/min — ABNORMAL LOW (ref >60)
Glucose, Bld: 196 mg/dL — ABNORMAL HIGH (ref 70–99)
Phosphorus: 4.9 mg/dL — ABNORMAL HIGH (ref 2.5–4.6)
Potassium: 4.2 mmol/L (ref 3.5–5.1)
Sodium: 130 mmol/L — ABNORMAL LOW (ref 135–145)

## 2018-08-14 LAB — CBC
HCT: 25.1 % — ABNORMAL LOW (ref 39.0–52.0)
Hemoglobin: 8 g/dL — ABNORMAL LOW (ref 13.0–17.0)
MCH: 28.5 pg (ref 26.0–34.0)
MCHC: 31.9 g/dL (ref 30.0–36.0)
MCV: 89.3 fL (ref 80.0–100.0)
Platelets: 534 10*3/uL — ABNORMAL HIGH (ref 150–400)
RBC: 2.81 MIL/uL — ABNORMAL LOW (ref 4.22–5.81)
RDW: 14.1 % (ref 11.5–15.5)
WBC: 15.3 10*3/uL — ABNORMAL HIGH (ref 4.0–10.5)
nRBC: 0 % (ref 0.0–0.2)

## 2018-08-14 LAB — GLUCOSE, CAPILLARY
GLUCOSE-CAPILLARY: 191 mg/dL — AB (ref 70–99)
Glucose-Capillary: 149 mg/dL — ABNORMAL HIGH (ref 70–99)
Glucose-Capillary: 117 mg/dL — ABNORMAL HIGH (ref 70–99)
Glucose-Capillary: 231 mg/dL — ABNORMAL HIGH (ref 70–99)
Glucose-Capillary: 188 mg/dL — ABNORMAL HIGH (ref 70–99)

## 2018-08-14 MED ORDER — SODIUM CHLORIDE 0.9 % IV SOLN: 100.0000 mL | INTRAVENOUS | Status: DC | PRN

## 2018-08-14 MED ORDER — HYDROMORPHONE HCL 1 MG/ML IJ SOLN
INTRAMUSCULAR | Status: AC
  Administered 2018-08-14: 1 mg via INTRAVENOUS
  Filled 2018-08-14: qty 1

## 2018-08-14 MED ORDER — DIPHENHYDRAMINE HCL 25 MG PO CAPS
25.0000 mg | ORAL_CAPSULE | Freq: Three times a day (TID) | ORAL | Status: DC | PRN
  Administered 2018-08-14: 25 mg via ORAL
  Filled 2018-08-14: qty 1

## 2018-08-14 MED ORDER — CALCITRIOL 0.5 MCG PO CAPS
ORAL_CAPSULE | ORAL | Status: AC
  Administered 2018-08-14: 1.25 ug via ORAL
  Filled 2018-08-14: qty 2

## 2018-08-14 MED ORDER — CALCITRIOL 0.25 MCG PO CAPS
ORAL_CAPSULE | ORAL | Status: AC
  Filled 2018-08-14: qty 1

## 2018-08-14 MED ORDER — DARBEPOETIN ALFA 200 MCG/0.4ML IJ SOSY: 200.0000 ug | PREFILLED_SYRINGE | INTRAMUSCULAR | Status: DC

## 2018-08-14 MED ORDER — DARBEPOETIN ALFA 200 MCG/0.4ML IJ SOSY
200.0000 ug | PREFILLED_SYRINGE | Freq: Once | INTRAMUSCULAR | Status: AC
  Administered 2018-08-14: 200 ug via INTRAVENOUS

## 2018-08-14 MED ORDER — DARBEPOETIN ALFA 200 MCG/0.4ML IJ SOSY
PREFILLED_SYRINGE | INTRAMUSCULAR | Status: AC
  Administered 2018-08-14: 200 ug via INTRAVENOUS
  Filled 2018-08-14: qty 0.4

## 2018-08-14 NOTE — Progress Notes (Signed)
PROGRESS NOTE  [name] Kawai Sr. ENI:[id number] DOB: 1976/01/26 DOA: 08/04/2018 PCP: Patient, [name] Per  HPI/Recap of past 24 hours: [name] a 42 y.o.malewithhistory of CAD status post CABG, ESRD on hemodialysis Monday Wednesday Friday, diabetes mellitus type 2, peripheral vascular disease status post amputation of multiple toes, hypertension presents to the ER because of increasing pain in the right foot with discoloration of his right foot small toe. Patient symptoms started 10 days ago which has been progressively worsening.MRI positive for osteomyelitis of 4th and 5th digits. Underwent right lower extremity arteriogram with peroneal atherectomy with subsequent calf hematoma which required evacuation and 4 compartment fasciotomy on 12/23.   08/14/2018: Patient seen and examined at dialysis center.  No acute events overnight.  He had no new complaints.  Pain control with Percocet and IV Dilaudid for severe pain as needed.  Patient: Incisions healing well.  Vascular surgery.  HD planned today.   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 toe amputation POD#4 S/p right fifth toe amputation with fasciotomy closure 12/26 Pain management in place Percocet every 6 hours as needed for moderate pain and on IV Dilaudid 1 mg as needed every 3 hours for severe pain Was treated with Vanco/zosyn, transition to Augmentin C/w Augmentin  Blood cultures negative to date  Continue physical therapy as recommended by vascular  surgery  PAD S/p RLE arteriogram with peroneal atherectomy with subsequent calf hematoma evacuation and 4 compartment fasciotomy 12/23  Aspirin, plavix  Acute blood loss anemia on chronic anemia of chronic kidney disease 3u pRBC transfused  Hg stable No sign of overt bleeding  HLD Continue lipitor   HTN Continue coreg   ESRD on dialysis Monday Wednesday Friday Nephrology following Hemodialysis today  DM with hyperglycemia Lantus, SSI  Mood disorder Continue lamictal, zoloft   Hyponatremia Monitor BMP    DVT prophylaxis: subq hep tid Code Status: Full Family Communication: No family at bedside Disposition Plan:  To home when vascular surgery and nephrology sign off.   Consultants:   Vascular surgery  Nephrology  Orthopedic surgery   Procedures:   S/p RLE arteriogram with peroneal atherectomy with subsequent calf hematoma evac and 4 compartment fasciotomy Dr. Donnetta Hutching 12/23  S/p right fifth toe amputation with fasciotomy closure 12/26     Objective: Vitals:   08/14/18 1100 08/14/18 1130 08/14/18 1136 08/14/18 1313  BP: (!) 153/78 (!) 123/41 (!) 153/42 (!) 155/47  Pulse: 94 92 92 100  Resp:   12   Temp:   97.8 F (36.6 C) 99.1 F (37.3 C)  TempSrc:   Oral Oral  SpO2:   98%   Weight:   85 kg   Height:        Intake/Output Summary (Last 24 hours) at 08/14/2018 1432 Last data filed at 08/14/2018 1136 Gross per 24 hour  Intake 460 ml  Output 2263 ml  Net -1803 ml   Filed Weights   08/14/18 0525 08/14/18 0755 08/14/18 1136  Weight: 83.6 kg 87.3  kg 85 kg    Exam:  . General: 42 y.o. year-old male WD WN NAD A&O x 3 . Cardiovascular: RRR no rubs or gallops. No JVD or thyromegaly . Respiratory: CTA no wheezes or rales. Good inspiratory efforts. . Abdomen: Soft nontender nondistended with normal bowel sounds x4 quadrants. . Psychiatry: Mood is appropriate for condition and setting.   Data Reviewed: CBC: Recent Labs  Lab  08/10/18 0303 08/10/18 1103 08/11/18 0348 08/12/18 0313 08/13/18 0328 08/14/18 0811  WBC 13.8*  --  14.9* 16.4* 14.6* 15.3*  HGB 5.7* 9.9* 8.1* 7.8* 8.1* 8.0*  HCT 18.1* 29.0* 25.1* 24.1* 25.2* 25.1*  MCV 85.8  --  86.9 87.0 89.0 89.3  PLT 355  --  344 386 477* 161*   Basic Metabolic Panel: Recent Labs  Lab 08/10/18 0303 08/10/18 1103 08/11/18 0348 08/12/18 0313 08/13/18 0328 08/14/18 0811  NA 128* 131* 132* 129* 133* 130*  K 3.9 4.3 4.4 4.3 4.3 4.2  CL 89*  --  91* 89* 94* 91*  CO2 27  --  27 25 27 25   GLUCOSE 295* 129* 297* 227* 144* 196*  BUN 37*  --  30* 37* 24* 41*  CREATININE 9.55*  --  7.89* 9.73* 6.41* 8.91*  CALCIUM 8.2*  --  8.1* 8.6* 8.4* 8.5*  PHOS 5.2*  --  4.9* 4.8* 4.4 4.9*   GFR: Estimated Creatinine Clearance: 12.7 mL/min (A) (by C-G formula based on SCr of 8.91 mg/dL (H)). Liver Function Tests: Recent Labs  Lab 08/10/18 0303 08/11/18 0348 08/12/18 0313 08/13/18 0328 08/14/18 0811  ALBUMIN 2.2* 2.2* 2.2* 2.3* 2.3*   No results for input(s): LIPASE, AMYLASE in the last 168 hours. No results for input(s): AMMONIA in the last 168 hours. Coagulation Profile: No results for input(s): INR, PROTIME in the last 168 hours. Cardiac Enzymes: No results for input(s): CKTOTAL, CKMB, CKMBINDEX, TROPONINI in the last 168 hours. BNP (last 3 results) No results for input(s): PROBNP in the last 8760 hours. HbA1C: No results for input(s): HGBA1C in the last 72 hours. CBG: Recent Labs  Lab 08/12/18 2054 08/13/18 0605 08/13/18 1115 08/13/18 2132 08/14/18 0558  GLUCAP 187* 120* 227* 288* 117*   Lipid Profile: No results for input(s): CHOL, HDL, LDLCALC, TRIG, CHOLHDL, LDLDIRECT in the last 72 hours. Thyroid Function Tests: No results for input(s): TSH, T4TOTAL, FREET4, T3FREE, THYROIDAB in the last 72 hours. Anemia Panel: No results for input(s): VITAMINB12, FOLATE, FERRITIN, TIBC, IRON, RETICCTPCT in the last 72 hours. Urine analysis:    Component  Value Date/Time   COLORURINE RED BIOCHEMICALS MAY BE AFFECTED BY COLOR (A) 01/29/2010 0939   APPEARANCEUR CLOUDY (A) 01/29/2010 0939   LABSPEC 1.020 01/29/2010 0939   PHURINE 8.0 01/29/2010 0939   GLUCOSEU NEGATIVE 01/29/2010 0939   HGBUR LARGE (A) 01/29/2010 0939   BILIRUBINUR MODERATE (A) 01/29/2010 0939   KETONESUR 15 (A) 01/29/2010 0939   PROTEINUR >300 (A) 01/29/2010 0939   UROBILINOGEN 0.2 01/29/2010 0939   NITRITE POSITIVE (A) 01/29/2010 0939   LEUKOCYTESUR LARGE (A) 01/29/2010 0939   Sepsis Labs: @LABRCNTIP (procalcitonin:4,lacticidven:4)  ) Recent Results (from the past 240 hour(s))  Blood culture (routine x 2)     Status: None   Collection Time: 08/04/18  8:35 PM  Result Value Ref Range Status   Specimen Description BLOOD RIGHT HAND  Final   Special Requests   Final    BOTTLES DRAWN AEROBIC AND ANAEROBIC Blood Culture adequate volume   Culture NO GROWTH 5 DAYS  Final  Report Status 08/09/2018 FINAL  Final  Blood culture (routine x 2)     Status: None   Collection Time: 08/04/18  9:30 PM  Result Value Ref Range Status   Specimen Description BLOOD RIGHT HAND  Final   Special Requests   Final    BOTTLES DRAWN AEROBIC AND ANAEROBIC Blood Culture adequate volume   Culture NO GROWTH 5 DAYS  Final   Report Status 08/09/2018 FINAL  Final  MRSA PCR Screening     Status: Abnormal   Collection Time: 08/05/18  2:08 AM  Result Value Ref Range Status   MRSA by PCR POSITIVE (A) NEGATIVE Final    Comment:        The GeneXpert MRSA Assay (FDA approved for NASAL specimens only), is one component of a comprehensive MRSA colonization surveillance program. It is not intended to diagnose MRSA infection nor to guide or monitor treatment for MRSA infections. RESULT CALLED TO, READ BACK BY AND VERIFIED WITHLendell Caprice RN [id number] 12/21/196 A BROWNING Performed at [facility], [place] [address] [place], [place] [address]   Aerobic/Anaerobic Culture (surgical/deep wound)      Status: None (Preliminary result)   Collection Time: 08/10/18 12:22 PM  Result Value Ref Range Status   Specimen Description TOE RIGHT  Final   Special Requests 5TH  Final   Gram Stain   Final    NO WBC SEEN NO ORGANISMS SEEN Performed at [facility], [address], [place], [place] [address]    Culture   Final    FEW CITROBACTER KOSERI RARE METHICILLIN RESISTANT STAPHYLOCOCCUS AUREUS NO ANAEROBES ISOLATED; CULTURE IN PROGRESS FOR 5 DAYS    Report Status PENDING  Incomplete   Organism ID, Bacteria CITROBACTER KOSERI  Final   Organism ID, Bacteria METHICILLIN RESISTANT STAPHYLOCOCCUS AUREUS  Final      Susceptibility   Citrobacter koseri - MIC*    CEFAZOLIN <=4 SENSITIVE Sensitive     CEFEPIME <=1 SENSITIVE Sensitive     CEFTAZIDIME <=1 SENSITIVE Sensitive     CEFTRIAXONE <=1 SENSITIVE Sensitive     CIPROFLOXACIN <=0.25 SENSITIVE Sensitive     GENTAMICIN <=1 SENSITIVE Sensitive     IMIPENEM <=0.25 SENSITIVE Sensitive     TRIMETH/SULFA <=20 SENSITIVE Sensitive     PIP/TAZO <=4 SENSITIVE Sensitive     * FEW CITROBACTER KOSERI   Methicillin resistant staphylococcus aureus - MIC*    CIPROFLOXACIN >=8 RESISTANT Resistant     ERYTHROMYCIN >=8 RESISTANT Resistant     GENTAMICIN <=0.5 SENSITIVE Sensitive     OXACILLIN >=4 RESISTANT Resistant     TETRACYCLINE <=1 SENSITIVE Sensitive     VANCOMYCIN <=0.5 SENSITIVE Sensitive     TRIMETH/SULFA <=10 SENSITIVE Sensitive     CLINDAMYCIN <=0.25 SENSITIVE Sensitive     RIFAMPIN <=0.5 SENSITIVE Sensitive     Inducible Clindamycin NEGATIVE Sensitive     * RARE METHICILLIN RESISTANT STAPHYLOCOCCUS AUREUS      Studies: No results found.  Scheduled Meds: . sodium chloride   Intravenous Once  . amoxicillin-clavulanate  1 tablet Oral q1800  . aspirin EC  81 mg Oral Daily  . atorvastatin  80 mg Oral q1800  . calcitRIOL  1.25 mcg Oral Q M,W,F-HD  . carvedilol  3.125 mg Oral Daily  . Chlorhexidine Gluconate Cloth  6 each Topical  Q0600  . Chlorhexidine Gluconate Cloth  6 each Topical Q0600  . clopidogrel  75 mg Oral Daily  . darbepoetin (ARANESP) injection - DIALYSIS  200 mcg Intravenous Q Thu-HD  . feeding supplement (PRO-STAT SUGAR FREE 64)  30 mL Oral BID  . heparin  5,000 Units Subcutaneous Q8H  . insulin aspart  0-5 Units Subcutaneous QHS  . insulin aspart  0-9 Units Subcutaneous TID WC  . insulin glargine  16 Units Subcutaneous QHS  . lamoTRIgine  150 mg Oral QHS  . lanthanum  2,000 mg Oral TID WC  . multivitamin  1 tablet Oral QHS  . sertraline  50 mg Oral Daily  . sodium chloride flush  3 mL Intravenous Q12H    Continuous Infusions: . sodium chloride 250 mL (08/05/18 2244)  . sodium chloride       LOS: 9 days     Kayleen Memos, MD [facility] Pager [phone]  If 7PM-7AM, please contact night-coverage [email or web address] Password [email or web address] 08/14/2018, 2:32 PM

## 2018-08-14 NOTE — Progress Notes (Signed)
OT Cancellation Note  Patient Details Name: [name] Sikora Sr. MRN: [id number] DOB: 02-20-1976   Cancelled Treatment:    Reason Eval/Treat Not Completed: Patient not medically ready(HD)   Jeri Modena, OTR/L  Acute Rehabilitation Services Pager: [phone] Office: [phone] .  Jeri Modena 08/14/2018, 9:03 AM

## 2018-08-14 NOTE — Progress Notes (Signed)
Report given to Dario Guardian RN hemodialysis.

## 2018-08-14 NOTE — Progress Notes (Addendum)
  Progress Note    08/14/2018 7:38 AM 4 Days Post-Op  Subjective:  Minimal pain R foot   Vitals:   08/13/18 1957 08/14/18 0525  BP: 117/83 (!) 161/83  Pulse: 89 98  Resp: 18 14  Temp: 98.3 F (36.8 C) 99 F (37.2 C)  SpO2: 97% 98%   Physical Exam: Lungs:  Non labored Incisions:  R lateral fasciotomy incision healing well with sutures in place; medial incision also c/d/i; R 5th toe amp site with sutures in place Extremities:  Some fluctuance to plantar base of 4th toe Neurologic: A&O  CBC    Component Value Date/Time   WBC 14.6 (H) 08/13/2018 0328   RBC 2.83 (L) 08/13/2018 0328   HGB 8.1 (L) 08/13/2018 0328   HGB 14.3 02/07/2018 0937   HCT 25.2 (L) 08/13/2018 0328   HCT 42.8 02/07/2018 0937   PLT 477 (H) 08/13/2018 0328   PLT 369 02/07/2018 0937   MCV 89.0 08/13/2018 0328   MCV 82 02/07/2018 0937   MCH 28.6 08/13/2018 0328   MCHC 32.1 08/13/2018 0328   RDW 14.2 08/13/2018 0328   RDW 21.5 (H) 02/07/2018 0937   LYMPHSABS 1.7 08/04/2018 2035   MONOABS 0.7 08/04/2018 2035   EOSABS 0.2 08/04/2018 2035   BASOSABS 0.1 08/04/2018 2035    BMET    Component Value Date/Time   NA 133 (L) 08/13/2018 0328   NA 133 (L) 02/07/2018 0937   K 4.3 08/13/2018 0328   CL 94 (L) 08/13/2018 0328   CO2 27 08/13/2018 0328   GLUCOSE 144 (H) 08/13/2018 0328   GLUCOSE 262 03/06/2008   BUN 24 (H) 08/13/2018 0328   BUN 37 (H) 02/07/2018 0937   CREATININE 6.41 (H) 08/13/2018 0328   CALCIUM 8.4 (L) 08/13/2018 0328   GFRNONAA 10 (L) 08/13/2018 0328   GFRAA 11 (L) 08/13/2018 0328    INR    Component Value Date/Time   INR 1.25 11/04/2017 1450     Intake/Output Summary (Last 24 hours) at 08/14/2018 0738 Last data filed at 08/14/2018 0300 Gross per 24 hour  Intake 720 ml  Output -  Net 720 ml     Assessment/Plan:  42 y.o. male is s/p 4 compartment fasciotomy, 5th toe amp 4 Days Post-Op   Fasciotomy incisions healing well DP signal R foot by doppler 5th toe amp site  unchanged, will continue to monitor plantar base of 4th toe; no indication for surgical intervention at this time HD today per Nephrology  Dagoberto Ligas, PA-C [facility] [phone] 08/14/2018 7:38 AM  I have seen and evaluated the patient. I agree with the PA note as documented above. Dressings changed.  Fasciotomies c/d/i.  Watching toe amp site.  [name], MD [facility] Office: [phone] Pager: [phone]

## 2018-08-14 NOTE — Progress Notes (Signed)
PT Cancellation Note  Patient Details Name: [name] Dills Sr. MRN: [id number] DOB: 1976-03-03   Cancelled Treatment:    Reason Eval/Treat Not Completed: Patient at procedure or test/unavailable(pt In HD)   Mark Benecke B Rachele Lamaster 08/14/2018, 8:34 AM  [name], PT Acute Rehabilitation Services Pager: [phone] Office: [phone]

## 2018-08-14 NOTE — Progress Notes (Signed)
[facility] Progress Note   Dialysis Orders: MWF at [facility] 4hr, 200 dialyzer, 500/800, EDW 80kg, 2K/2Ca, UFP #2, AVF,  -Heparin 2400 units IV bolus -Calcitriol 1.41mcg PO TIW - No ESA (Last Hgb 14)  Assessment/ Plan:   1. PAD- with right 5th toe gangrene s/p revascularization complicated by compartment syndrome requiring emergent fasciotomies and hematoma evacuation. S/P RLE lateral fasciotomy closure/R ray 5th toe amputation PO day 3. On Augmentin-renal dosed.  2. ESRDcontinue with HD q MWF. HD tomorrow on schedule.  Seen on HD at 810AM 2/2.5 165/41  450/800 No complaints dizziness or cramping  3. Anemia:s/p ABLA following surgery.S/P 3 units PRBCs. HGB 8.1. No ESA given yet.  - Will give Aranesp 200 mcg IV with HD today (12/30); already spoke with nurse.  Follow HGB.  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  5. Nutrition:Albumin 2.4 renal diet, start prostat, on renal vit.  6. Hypertension:BP and volume fairly well controlled. Wt today 81.3 UFG 2-2.5 liters today.  7. CAD s/p CABG- stable 8. DM type 2- per primary  Subjective:   No complaints other than leg pain but better than yesterday. Didn't sleep well last night. Denies f/c/n/v   Objective:   BP (!) 165/41   Pulse 85   Temp 97.9 F (36.6 C) (Oral)   Resp 11   Ht 6\' 2"  (1.88 m)   Wt 87.3 kg   SpO2 98%   BMI 24.71 kg/m   Intake/Output Summary (Last 24 hours) at 08/14/2018 0820 Last data filed at 08/14/2018 0300 Gross per 24 hour  Intake 560 ml  Output -  Net 560 ml   Weight change: -3.3 kg  Physical Exam: General:Chronically ill appearing male in NAD Heart: S1,S2 RRR Lungs: CTAB A/P Abdomen: Active BS Extremities: Ace wrap RLE CDI. 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  Labs: BMET Recent Labs  Lab 08/08/18 0251 08/09/18 0320 08/10/18 0303 08/10/18 1103 08/11/18 0348 08/12/18 0313 08/13/18 0328  NA 134* 131* 128* 131* 132* 129*  133*  K 4.2 3.9 3.9 4.3 4.4 4.3 4.3  CL 93* 90* 89*  --  91* 89* 94*  CO2 20* 29 27  --  27 25 27   GLUCOSE 200* 265* 295* 129* 297* 227* 144*  BUN 44* 26* 37*  --  30* 37* 24*  CREATININE 9.78* 6.76* 9.55*  --  7.89* 9.73* 6.41*  CALCIUM 8.6* 8.1* 8.2*  --  8.1* 8.6* 8.4*  PHOS  --   --  5.2*  --  4.9* 4.8* 4.4   CBC Recent Labs  Lab 08/10/18 0303 08/10/18 1103 08/11/18 0348 08/12/18 0313 08/13/18 0328  WBC 13.8*  --  14.9* 16.4* 14.6*  HGB 5.7* 9.9* 8.1* 7.8* 8.1*  HCT 18.1* 29.0* 25.1* 24.1* 25.2*  MCV 85.8  --  86.9 87.0 89.0  PLT 355  --  344 386 477*    Medications:    . sodium chloride   Intravenous Once  . amoxicillin-clavulanate  1 tablet Oral q1800  . aspirin EC  81 mg Oral Daily  . atorvastatin  80 mg Oral q1800  . calcitRIOL  1.25 mcg Oral Q M,W,F-HD  . carvedilol  3.125 mg Oral Daily  . Chlorhexidine Gluconate Cloth  6 each Topical Q0600  . Chlorhexidine Gluconate Cloth  6 each Topical Q0600  . clopidogrel  75 mg Oral Daily  . darbepoetin (ARANESP) injection - DIALYSIS  200 mcg Intravenous Q Thu-HD  .  feeding supplement (PRO-STAT SUGAR FREE 64)  30 mL Oral BID  . heparin  5,000 Units Subcutaneous Q8H  . insulin aspart  0-5 Units Subcutaneous QHS  . insulin aspart  0-9 Units Subcutaneous TID WC  . insulin glargine  16 Units Subcutaneous QHS  . lamoTRIgine  150 mg Oral QHS  . lanthanum  2,000 mg Oral TID WC  . multivitamin  1 tablet Oral QHS  . sertraline  50 mg Oral Daily  . sodium chloride flush  3 mL Intravenous Q12H      Otelia Santee, MD 08/14/2018, 8:20 AM

## 2018-08-14 NOTE — Progress Notes (Signed)
Inpatient Diabetes Program Recommendations  AACE/ADA: New Consensus Statement on Inpatient Glycemic Control (2019)  Target Ranges:  Prepandial:   less than 140 mg/dL      Peak postprandial:   less than 180 mg/dL (1-2 hours)      Critically ill patients:  140 - 180 mg/dL  Results for [name], [name]. (MRN [id number]) as of 08/14/2018 15:08  Ref. Range 08/13/2018 06:05 08/13/2018 11:15 08/13/2018 21:32 08/14/2018 05:58 08/14/2018 13:12  Glucose-Capillary Latest Ref Range: 70 - 99 mg/dL 120 (H) 227 (H) 288 (H) 117 (H) 231 (H)    Review of Glycemic Control  Outpatient Diabetes medications: Lantus 6 units QHS Current orders for Inpatient glycemic control: Lantus 16 units QHS, Novolog 0-9 units TID with meals, Novolog 0-5 units QHS  Inpatient Diabetes Program Recommendations:  Insulin - Meal Coverage: Please consider ordering Novolog 2 units TID with meals for meal coverage if patient eats at least 50% of meals. Diet: If appropriate, please consider adding Carb Mod to Renal diet.  Thanks, Barnie Alderman, RN, MSN, CDE Diabetes Coordinator Inpatient Diabetes Program [phone] (Team Pager from 8am to 5pm)

## 2018-08-15 LAB — CBC
HCT: 25.1 % — ABNORMAL LOW (ref 39.0–52.0)
Hemoglobin: 8.1 g/dL — ABNORMAL LOW (ref 13.0–17.0)
MCH: 29 pg (ref 26.0–34.0)
MCHC: 32.3 g/dL (ref 30.0–36.0)
MCV: 90 fL (ref 80.0–100.0)
PLATELETS: 598 10*3/uL — AB (ref 150–400)
RBC: 2.79 MIL/uL — ABNORMAL LOW (ref 4.22–5.81)
RDW: 14.3 % (ref 11.5–15.5)
WBC: 13.5 10*3/uL — ABNORMAL HIGH (ref 4.0–10.5)
nRBC: 0 % (ref 0.0–0.2)

## 2018-08-15 LAB — AEROBIC/ANAEROBIC CULTURE W GRAM STAIN (SURGICAL/DEEP WOUND): Gram Stain: NONE SEEN

## 2018-08-15 LAB — GLUCOSE, CAPILLARY
GLUCOSE-CAPILLARY: 137 mg/dL — AB (ref 70–99)
Glucose-Capillary: 91 mg/dL (ref 70–99)
Glucose-Capillary: 170 mg/dL — ABNORMAL HIGH (ref 70–99)
Glucose-Capillary: 166 mg/dL — ABNORMAL HIGH (ref 70–99)

## 2018-08-15 MED ORDER — SULFAMETHOXAZOLE-TRIMETHOPRIM 800-160 MG PO TABS
1.0000 | ORAL_TABLET | Freq: Once | ORAL | Status: AC
  Administered 2018-08-15: 1 via ORAL
  Filled 2018-08-15: qty 1

## 2018-08-15 MED ORDER — SULFAMETHOXAZOLE-TRIMETHOPRIM 400-80 MG PO TABS
1.0000 | ORAL_TABLET | Freq: Every day | ORAL | Status: DC
  Administered 2018-08-16: 1 via ORAL
  Filled 2018-08-15 (×2): qty 1

## 2018-08-15 NOTE — Progress Notes (Addendum)
  Progress Note    08/15/2018 7:41 AM 5 Days Post-Op  Subjective:  Patient reports sudden sharp pain R foot during dialysis yesterday however no pain currently   Vitals:   08/14/18 2125 08/15/18 0529  BP: (!) 171/42 (!) 137/57  Pulse: 92 83  Resp: 12 18  Temp: 98.9 F (37.2 C) 98.5 F (36.9 C)  SpO2: 98% 100%   Physical Exam: Lungs:  Non labored Incisions:  Fasciotomy incisions c/d/i; 5th toe amp site stable Extremities:  DP by doppler Neurologic: A&O  CBC    Component Value Date/Time   WBC 15.3 (H) 08/14/2018 0811   RBC 2.81 (L) 08/14/2018 0811   HGB 8.0 (L) 08/14/2018 0811   HGB 14.3 02/07/2018 0937   HCT 25.1 (L) 08/14/2018 0811   HCT 42.8 02/07/2018 0937   PLT 534 (H) 08/14/2018 0811   PLT 369 02/07/2018 0937   MCV 89.3 08/14/2018 0811   MCV 82 02/07/2018 0937   MCH 28.5 08/14/2018 0811   MCHC 31.9 08/14/2018 0811   RDW 14.1 08/14/2018 0811   RDW 21.5 (H) 02/07/2018 0937   LYMPHSABS 1.7 08/04/2018 2035   MONOABS 0.7 08/04/2018 2035   EOSABS 0.2 08/04/2018 2035   BASOSABS 0.1 08/04/2018 2035    BMET    Component Value Date/Time   NA 130 (L) 08/14/2018 0811   NA 133 (L) 02/07/2018 0937   K 4.2 08/14/2018 0811   CL 91 (L) 08/14/2018 0811   CO2 25 08/14/2018 0811   GLUCOSE 196 (H) 08/14/2018 0811   GLUCOSE 262 03/06/2008   BUN 41 (H) 08/14/2018 0811   BUN 37 (H) 02/07/2018 0937   CREATININE 8.91 (H) 08/14/2018 0811   CALCIUM 8.5 (L) 08/14/2018 0811   GFRNONAA 7 (L) 08/14/2018 0811   GFRAA 8 (L) 08/14/2018 0811    INR    Component Value Date/Time   INR 1.25 11/04/2017 1450     Intake/Output Summary (Last 24 hours) at 08/15/2018 0741 Last data filed at 08/14/2018 1136 Gross per 24 hour  Intake -  Output 2263 ml  Net -2263 ml     Assessment/Plan:  42 y.o. male is s/p 4 compartment fasciotomy, 5th toe amp 5 Days Post-Op   Patient maintains R DP signal by doppler 5th toe amp site unchanged; continue to monitor medial aspect of incision  and 4th toe base No indication for surgical intervention at this time; patient is aware of high risk for more proximal amputation HD per Nephrology   Dagoberto Ligas, PA-C [facility] [phone] 08/15/2018 7:41 AM  I have independently interviewed and examined patient and agree with PA assessment and plan above. Strong distal peroneal signal. Hopeful for healing of 5th toe amputation site.    C. Donzetta Matters, MD [facility] [place] Office: [phone] Pager: [phone]

## 2018-08-15 NOTE — Plan of Care (Signed)
  Problem: Health Behavior/Discharge Planning: Goal: Ability to manage health-related needs will improve Outcome: Progressing   Problem: Clinical Measurements: Goal: Ability to maintain clinical measurements within normal limits will improve Outcome: Progressing   

## 2018-08-15 NOTE — Progress Notes (Signed)
PROGRESS NOTE  [name] Basilio Sr. MGQ:[id number] DOB: 12/24/1975 DOA: 08/04/2018 PCP: Patient, [name] Per  HPI/Recap of past 24 hours: [name] a 42 y.o.malewithhistory of CAD status post CABG, ESRD on hemodialysis Monday Wednesday Friday, diabetes mellitus type 2, peripheral vascular disease status post amputation of multiple toes, hypertension presents to the ER because of increasing pain in the right foot with discoloration of his right foot small toe. Patient symptoms started 10 days ago which has been progressively worsening.MRI positive for osteomyelitis of 4th and 5th digits. Underwent right lower extremity arteriogram with peroneal atherectomy with subsequent calf hematoma which required evacuation and 4 compartment fasciotomy on 12/23.   08/14/2018: Patient seen and examined at dialysis center.  No acute events overnight.  He had no new complaints.  Pain control with Percocet and IV Dilaudid for severe pain as needed.  Patient: Incisions healing well.  Vascular surgery.  HD planned today.  08/15/2018: Patient seen and examined his bedside.  No acute events overnight.  Nephrology planning on removing more fluid not back to his dry weight.   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 POD#5 S/p right fifth toe amputation with fasciotomy closure 12/26 Pain management in place Percocet every 6 hours as needed for moderate pain and on IV Dilaudid 1 mg as needed every 3 hours for severe pain Was  treated with Vanco/zosyn, transitioned to Augmentin Wound culture revealed Citrobacter [name] and MRSA Was on Augmentin, switched antibiotics to Bactrim Blood cultures negative to date  Continue physical therapy as recommended by vascular surgery  PAD S/p RLE arteriogram with peroneal atherectomy with subsequent calf hematoma evacuation and 4 compartment fasciotomy 12/23  Aspirin, plavix  Acute blood loss anemia on chronic anemia of chronic kidney disease 3u pRBC transfused  Hg stable No sign of overt bleeding  HLD Continue lipitor   HTN Continue coreg   ESRD on dialysis Monday Wednesday Friday Nephrology following Hemodialysis today  DM with hyperglycemia Lantus, SSI  Mood disorder Continue lamictal, zoloft   Hyponatremia Monitor BMP    DVT prophylaxis: subq hep tid Code Status: Full Family Communication: No family at bedside Disposition Plan:  To home when vascular surgery and nephrology sign off.   Consultants:   Vascular surgery  Nephrology  Orthopedic surgery   Procedures:   S/p RLE arteriogram with peroneal atherectomy with subsequent calf hematoma evac and 4 compartment fasciotomy Dr. Donnetta Hutching 12/23  S/p right fifth toe amputation with fasciotomy closure 12/26     Objective: Vitals:   08/14/18 1313 08/14/18 2125 08/15/18 0529 08/15/18 1229  BP: (!) 155/47 (!) 171/42 (!) 137/57 (!) 158/43  Pulse: 100 92 83   Resp:  12 18 15   Temp: 99.1 F (37.3 C) 98.9 F (37.2 C) 98.5 F (36.9 C) 98.7 F (37.1 C)  TempSrc: Oral Oral Oral Oral  SpO2:  98% 100%   Weight:   88.1 kg   Height:       No intake or output data  in the 24 hours ending 08/15/18 1533 Filed Weights   08/14/18 0755 08/14/18 1136 08/15/18 0529  Weight: 87.3 kg 85 kg 88.1 kg    Exam:  . General: 42 y.o. year-old male well-developed well-nourished in no acute distress.  Alert and oriented x3. . Cardiovascular: Regular rhythm with no rubs or gallops.  No JVD or  thyromegaly noted. Marland Kitchen Respiratory: Clear to auscultation with no wheezes or rales.  Good inspiratory effort. . Abdomen: Soft nontender nondistended with normal bowel sounds x4 quadrants. . Psychiatry: Mood is appropriate for condition and setting.   Data Reviewed: CBC: Recent Labs  Lab 08/11/18 0348 08/12/18 0313 08/13/18 0328 08/14/18 0811 08/15/18 0819  WBC 14.9* 16.4* 14.6* 15.3* 13.5*  HGB 8.1* 7.8* 8.1* 8.0* 8.1*  HCT 25.1* 24.1* 25.2* 25.1* 25.1*  MCV 86.9 87.0 89.0 89.3 90.0  PLT 344 386 477* 534* 734*   Basic Metabolic Panel: Recent Labs  Lab 08/10/18 0303 08/10/18 1103 08/11/18 0348 08/12/18 0313 08/13/18 0328 08/14/18 0811  NA 128* 131* 132* 129* 133* 130*  K 3.9 4.3 4.4 4.3 4.3 4.2  CL 89*  --  91* 89* 94* 91*  CO2 27  --  27 25 27 25   GLUCOSE 295* 129* 297* 227* 144* 196*  BUN 37*  --  30* 37* 24* 41*  CREATININE 9.55*  --  7.89* 9.73* 6.41* 8.91*  CALCIUM 8.2*  --  8.1* 8.6* 8.4* 8.5*  PHOS 5.2*  --  4.9* 4.8* 4.4 4.9*   GFR: Estimated Creatinine Clearance: 12.7 mL/min (A) (by C-G formula based on SCr of 8.91 mg/dL (H)). Liver Function Tests: Recent Labs  Lab 08/10/18 0303 08/11/18 0348 08/12/18 0313 08/13/18 0328 08/14/18 0811  ALBUMIN 2.2* 2.2* 2.2* 2.3* 2.3*   No results for input(s): LIPASE, AMYLASE in the last 168 hours. No results for input(s): AMMONIA in the last 168 hours. Coagulation Profile: No results for input(s): INR, PROTIME in the last 168 hours. Cardiac Enzymes: No results for input(s): CKTOTAL, CKMB, CKMBINDEX, TROPONINI in the last 168 hours. BNP (last 3 results) No results for input(s): PROBNP in the last 8760 hours. HbA1C: No results for input(s): HGBA1C in the last 72 hours. CBG: Recent Labs  Lab 08/14/18 1312 08/14/18 1707 08/14/18 2124 08/15/18 0617 08/15/18 1157  GLUCAP 231* 191* 188* 91 170*   Lipid Profile: No results for input(s): CHOL, HDL, LDLCALC, TRIG, CHOLHDL, LDLDIRECT in the last 72 hours. Thyroid  Function Tests: No results for input(s): TSH, T4TOTAL, FREET4, T3FREE, THYROIDAB in the last 72 hours. Anemia Panel: No results for input(s): VITAMINB12, FOLATE, FERRITIN, TIBC, IRON, RETICCTPCT in the last 72 hours. Urine analysis:    Component Value Date/Time   COLORURINE RED BIOCHEMICALS MAY BE AFFECTED BY COLOR (A) 01/29/2010 0939   APPEARANCEUR CLOUDY (A) 01/29/2010 0939   LABSPEC 1.020 01/29/2010 0939   PHURINE 8.0 01/29/2010 0939   GLUCOSEU NEGATIVE 01/29/2010 0939   HGBUR LARGE (A) 01/29/2010 0939   BILIRUBINUR MODERATE (A) 01/29/2010 0939   KETONESUR 15 (A) 01/29/2010 0939   PROTEINUR >300 (A) 01/29/2010 0939   UROBILINOGEN 0.2 01/29/2010 0939   NITRITE POSITIVE (A) 01/29/2010 0939   LEUKOCYTESUR LARGE (A) 01/29/2010 0939   Sepsis Labs: @LABRCNTIP (procalcitonin:4,lacticidven:4)  ) Recent Results (from the past 240 hour(s))  Aerobic/Anaerobic Culture (surgical/deep wound)     Status: None   Collection Time: 08/10/18 12:22 PM  Result Value Ref Range Status   Specimen Description TOE RIGHT  Final   Special Requests 5TH  Final  Gram Stain NO WBC SEEN NO ORGANISMS SEEN   Final   Culture   Final    FEW CITROBACTER KOSERI RARE METHICILLIN RESISTANT STAPHYLOCOCCUS AUREUS NO ANAEROBES ISOLATED Performed at [facility], [place] [address] [place], [place] [address]    Report Status 08/15/2018 FINAL  Final   Organism ID, Bacteria CITROBACTER KOSERI  Final   Organism ID, Bacteria METHICILLIN RESISTANT STAPHYLOCOCCUS AUREUS  Final      Susceptibility   Citrobacter koseri - MIC*    CEFAZOLIN <=4 SENSITIVE Sensitive     CEFEPIME <=1 SENSITIVE Sensitive     CEFTAZIDIME <=1 SENSITIVE Sensitive     CEFTRIAXONE <=1 SENSITIVE Sensitive     CIPROFLOXACIN <=0.25 SENSITIVE Sensitive     GENTAMICIN <=1 SENSITIVE Sensitive     IMIPENEM <=0.25 SENSITIVE Sensitive     TRIMETH/SULFA <=20 SENSITIVE Sensitive     PIP/TAZO <=4 SENSITIVE Sensitive     * FEW CITROBACTER KOSERI    Methicillin resistant staphylococcus aureus - MIC*    CIPROFLOXACIN >=8 RESISTANT Resistant     ERYTHROMYCIN >=8 RESISTANT Resistant     GENTAMICIN <=0.5 SENSITIVE Sensitive     OXACILLIN >=4 RESISTANT Resistant     TETRACYCLINE <=1 SENSITIVE Sensitive     VANCOMYCIN <=0.5 SENSITIVE Sensitive     TRIMETH/SULFA <=10 SENSITIVE Sensitive     CLINDAMYCIN <=0.25 SENSITIVE Sensitive     RIFAMPIN <=0.5 SENSITIVE Sensitive     Inducible Clindamycin NEGATIVE Sensitive     * RARE METHICILLIN RESISTANT STAPHYLOCOCCUS AUREUS      Studies: No results found.  Scheduled Meds: . sodium chloride   Intravenous Once  . aspirin EC  81 mg Oral Daily  . atorvastatin  80 mg Oral q1800  . calcitRIOL  1.25 mcg Oral Q M,W,F-HD  . carvedilol  3.125 mg Oral Daily  . Chlorhexidine Gluconate Cloth  6 each Topical Q0600  . clopidogrel  75 mg Oral Daily  . [START ON 08/21/2018] darbepoetin (ARANESP) injection - DIALYSIS  200 mcg Intravenous Q Mon-HD  . feeding supplement (PRO-STAT SUGAR FREE 64)  30 mL Oral BID  . heparin  5,000 Units Subcutaneous Q8H  . insulin aspart  0-5 Units Subcutaneous QHS  . insulin aspart  0-9 Units Subcutaneous TID WC  . insulin glargine  16 Units Subcutaneous QHS  . lamoTRIgine  150 mg Oral QHS  . lanthanum  2,000 mg Oral TID WC  . multivitamin  1 tablet Oral QHS  . sertraline  50 mg Oral Daily  . sodium chloride flush  3 mL Intravenous Q12H  . [START ON 08/16/2018] sulfamethoxazole-trimethoprim  1 tablet Oral Q2000    Continuous Infusions: . sodium chloride 250 mL (08/05/18 2244)  . sodium chloride       LOS: 10 days     Kayleen Memos, MD [facility] Pager [phone]  If 7PM-7AM, please contact night-coverage [email or web address] Password [email or web address] 08/15/2018, 3:33 PM

## 2018-08-15 NOTE — Progress Notes (Addendum)
[facility] Progress Note   Subjective: Says his R foot is starting to hurt. Denies C/P or SOB.    Objective Vitals:   08/14/18 1136 08/14/18 1313 08/14/18 2125 08/15/18 0529  BP: (!) 153/42 (!) 155/47 (!) 171/42 (!) 137/57  Pulse: 92 100 92 83  Resp: 12  12 18   Temp: 97.8 F (36.6 C) 99.1 F (37.3 C) 98.9 F (37.2 C) 98.5 F (36.9 C)  TempSrc: Oral Oral Oral Oral  SpO2: 98%  98% 100%  Weight: 85 kg   88.1 kg  Height:       Physical Exam General:Chronically ill appearing male in NAD Heart: S1,S2 RRR Lungs: CTAB A/P Abdomen: Active BS Extremities: RLE incision open to air.No drainage. 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 CBC: Recent Labs  Lab 08/11/18 0348 08/12/18 0313 08/13/18 0328 08/14/18 0811 08/15/18 0819  WBC 14.9* 16.4* 14.6* 15.3* 13.5*  HGB 8.1* 7.8* 8.1* 8.0* 8.1*  HCT 25.1* 24.1* 25.2* 25.1* 25.1*  MCV 86.9 87.0 89.0 89.3 90.0  PLT 344 386 477* 534* 598*   Blood Culture    Component Value Date/Time   SDES TOE RIGHT 08/10/2018 1222   SPECREQUEST 5TH 08/10/2018 1222   CULT  08/10/2018 1222    FEW CITROBACTER KOSERI RARE METHICILLIN RESISTANT STAPHYLOCOCCUS AUREUS NO ANAEROBES ISOLATED Performed at [facility], [place] [address], [place], [place] [address]    REPTSTATUS 08/15/2018 FINAL 08/10/2018 1222    Cardiac Enzymes: No results for input(s): CKTOTAL, CKMB, CKMBINDEX, TROPONINI in the last 168 hours. CBG: Recent Labs  Lab  08/14/18 0558 08/14/18 1312 08/14/18 1707 08/14/18 2124 08/15/18 0617  GLUCAP 117* 231* 191* 188* 91   Iron Studies: No results for input(s): IRON, TIBC, TRANSFERRIN, FERRITIN in the last 72 hours. @lablastinr3 @ Studies/Results: No results found. Medications: . sodium chloride 250 mL (08/05/18 2244)  . sodium chloride     . sodium chloride   Intravenous Once  . aspirin EC  81 mg Oral Daily  . atorvastatin  80 mg Oral q1800  . calcitRIOL  1.25 mcg Oral Q M,W,F-HD  . carvedilol  3.125 mg Oral Daily  . Chlorhexidine Gluconate Cloth  6 each Topical Q0600  . Chlorhexidine Gluconate Cloth  6 each Topical Q0600  . clopidogrel  75 mg Oral Daily  . [START ON 08/21/2018] darbepoetin (ARANESP) injection - DIALYSIS  200 mcg Intravenous Q Mon-HD  . feeding supplement (PRO-STAT SUGAR FREE 64)  30 mL Oral BID  . heparin  5,000 Units Subcutaneous Q8H  . insulin aspart  0-5 Units Subcutaneous QHS  . insulin aspart  0-9 Units Subcutaneous TID WC  . insulin glargine  16 Units Subcutaneous QHS  . lamoTRIgine  150 mg Oral QHS  . lanthanum  2,000 mg Oral TID WC  . multivitamin  1 tablet Oral QHS  . sertraline  50 mg Oral Daily  . sodium chloride flush  3 mL Intravenous Q12H  . sulfamethoxazole-trimethoprim  1 tablet Oral Once  . [START ON 08/16/2018] sulfamethoxazole-trimethoprim  1 tablet Oral Q2000     Dialysis Orders: MWF at [facility] 4hr, 200 dialyzer, 500/800, EDW 80kg, 2K/2Ca, UFP #2, AVF,  -Heparin 2400 units IV bolus -Calcitriol 1.80mcg PO TIW - No ESA (Last Hgb 14)  Assessment/Plan: 1. PAD- with right 5th toe gangrene s/p revascularization complicated by compartment syndrome requiring emergent fasciotomies and hematoma evacuation. S/P RLE lateral fasciotomy closure/R ray 5th toe amputation PO day 3. On Augmentin-renal dosed. WBC 13.5 today.  2. ESRDcontinue with HD q MWF. HD tomorrow on schedule.  3. Anemia:s/p ABLA following surgery.S/P 3 units PRBCs. HGB 8.1. Rec'd Aranesp 200 mcg IV  08/14/18. Follow HGB.  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 Cont VDRA, binders.  5. Nutrition:Albumin 2.3 renal diet, start prostat, on renal vit.  6. Hypertension:BP and volume fairly well controlled. HD 12/30 Pre wt 87.3 Net UF 2263 Post wt 85 kg Still above OP EDW, hypertensive. Continue lowering volume.    7. CAD s/p CABG- stable 8. DM type 2- per primary  Rita H. Brown NP-C 08/15/2018, 11:33 AM  [employer] [phone]   I have seen and examined this patient and agree with the plan of care. No acute indication for dialysis today; will plan on HD Wed. Still above EDW. Leg pain improved.  Dwana Melena, MD 08/15/2018, 3:11 PM

## 2018-08-15 NOTE — Progress Notes (Signed)
PT Cancellation Note  Patient Details Name: [name] Misner Sr. MRN: [id number] DOB: Jan 05, 1976   Cancelled Treatment:    Reason Eval/Treat Not Completed: Other (comment). Attempted x 2 and pt reports fatigued and defers. Continue to follow. From our standpoint pt can return home at w/c level when medically ready. Will continue to follow until DC.   Shary Decamp Maycok 08/15/2018, 4:14 PM [place] Pager [phone] Office [phone]

## 2018-08-16 LAB — GLUCOSE, CAPILLARY
GLUCOSE-CAPILLARY: 296 mg/dL — AB (ref 70–99)
Glucose-Capillary: 100 mg/dL — ABNORMAL HIGH (ref 70–99)
Glucose-Capillary: 221 mg/dL — ABNORMAL HIGH (ref 70–99)
Glucose-Capillary: 130 mg/dL — ABNORMAL HIGH (ref 70–99)

## 2018-08-16 LAB — CBC
HCT: 24.7 % — ABNORMAL LOW (ref 39.0–52.0)
Hemoglobin: 7.9 g/dL — ABNORMAL LOW (ref 13.0–17.0)
MCH: 28.6 pg (ref 26.0–34.0)
MCHC: 32 g/dL (ref 30.0–36.0)
MCV: 89.5 fL (ref 80.0–100.0)
Platelets: 655 10*3/uL — ABNORMAL HIGH (ref 150–400)
RBC: 2.76 MIL/uL — ABNORMAL LOW (ref 4.22–5.81)
RDW: 14.2 % (ref 11.5–15.5)
WBC: 13.5 10*3/uL — ABNORMAL HIGH (ref 4.0–10.5)
nRBC: 0 % (ref 0.0–0.2)

## 2018-08-16 LAB — RENAL FUNCTION PANEL
Albumin: 2.3 g/dL — ABNORMAL LOW (ref 3.5–5.0)
Anion gap: 9 (ref 5–15)
BUN: 48 mg/dL — ABNORMAL HIGH (ref 6–20)
CO2: 27 mmol/L (ref 22–32)
Calcium: 8.9 mg/dL (ref 8.9–10.3)
Chloride: 95 mmol/L — ABNORMAL LOW (ref 98–111)
Creatinine, Ser: 8.45 mg/dL — ABNORMAL HIGH (ref 0.61–1.24)
GFR calc Af Amer: 8 mL/min — ABNORMAL LOW (ref >60)
GFR calc non Af Amer: 7 mL/min — ABNORMAL LOW (ref >60)
Glucose, Bld: 95 mg/dL (ref 70–99)
Phosphorus: 3.5 mg/dL (ref 2.5–4.6)
Potassium: 3.9 mmol/L (ref 3.5–5.1)
Sodium: 131 mmol/L — ABNORMAL LOW (ref 135–145)

## 2018-08-16 MED ORDER — CALCITRIOL 0.5 MCG PO CAPS
ORAL_CAPSULE | ORAL | Status: AC
  Filled 2018-08-16: qty 2

## 2018-08-16 MED ORDER — HYDROMORPHONE HCL 1 MG/ML IJ SOLN
INTRAMUSCULAR | Status: AC
  Filled 2018-08-16: qty 1

## 2018-08-16 MED ORDER — LIDOCAINE HCL (PF) 1 % IJ SOLN: 5.0000 mL | INTRAMUSCULAR | Status: DC | PRN

## 2018-08-16 MED ORDER — SODIUM CHLORIDE 0.9 % IV SOLN: 100.0000 mL | INTRAVENOUS | Status: DC | PRN

## 2018-08-16 MED ORDER — PENTAFLUOROPROP-TETRAFLUOROETH EX AERO: 1.0000 "application " | INHALATION_SPRAY | CUTANEOUS | Status: DC | PRN

## 2018-08-16 MED ORDER — CALCITRIOL 0.25 MCG PO CAPS
ORAL_CAPSULE | ORAL | Status: AC
  Filled 2018-08-16: qty 1

## 2018-08-16 MED ORDER — ALTEPLASE 2 MG IJ SOLR: 2.0000 mg | Freq: Once | INTRAMUSCULAR | Status: DC | PRN

## 2018-08-16 MED ORDER — LIDOCAINE-PRILOCAINE 2.5-2.5 % EX CREA: 1.0000 "application " | TOPICAL_CREAM | CUTANEOUS | Status: DC | PRN

## 2018-08-16 NOTE — Plan of Care (Signed)
  Problem: Clinical Measurements: Goal: Ability to maintain clinical measurements within normal limits will improve Outcome: Progressing   Problem: Clinical Measurements: Goal: Will remain free from infection Outcome: Progressing   

## 2018-08-16 NOTE — Progress Notes (Signed)
   Patient evaluated on dialysis.  He is having some pain in the right foot today.  Fasciotomy sites clean dry intact with sutures and staples.  Right foot amputation site appears to be healing and the foot is warm.  Left groin palpable femoral pulse   [name] C. Donzetta Matters, MD [facility] [place] Office: [phone] Pager: [phone]

## 2018-08-16 NOTE — Progress Notes (Signed)
PROGRESS NOTE  [name] Barrell Sr. JIR:[id number] DOB: [birth year] DOA: 08/04/2018 PCP: Patient, [name] Per  HPI/Recap of past 24 hours: [name] [name] a 43 y.o.malewithhistory of CAD status post CABG, ESRD on hemodialysis Monday Wednesday Friday, diabetes mellitus type 2, peripheral vascular disease status post amputation of multiple toes, hypertension presents to the ER because of increasing pain in the right foot with discoloration of his right foot small toe. Patient symptoms started 10 days ago which has been progressively worsening.MRI positive for osteomyelitis of 4th and 5th digits. Underwent right lower extremity arteriogram with peroneal atherectomy with subsequent calf hematoma which required evacuation and 4 compartment fasciotomy on 12/23.   08/14/2018: Patient seen and examined at dialysis center.  No acute events overnight.  He had no new complaints.  Pain control with Percocet and IV Dilaudid for severe pain as needed.  Patient: Incisions healing well.  Vascular surgery.  HD planned today.  08/15/2018: Patient seen and examined his bedside.  No acute events overnight.  Nephrology planning on removing more fluid not back to his dry weight.  08/16/2018: Patient seen and examined at bedside at dialysis center.  Reports severe pain at his surgical site prior to pain medication.  IV Dilaudid in place for severe pain.   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 toe amputation POD#5 S/p right fifth toe amputation  with fasciotomy closure 12/26 Pain management in place Percocet every 6 hours as needed for moderate pain and on IV Dilaudid 1 mg as needed every 3 hours for severe pain Was treated with Vanco/zosyn, transitioned to Augmentin Wound culture revealed Citrobacter and MRSA Was on Augmentin, switched to Bactrim Blood cultures negative to date  WBC continues to trend down Continue physical therapy as recommended by vascular surgery  PAD S/p RLE arteriogram with peroneal atherectomy with subsequent calf hematoma evacuation and 4 compartment fasciotomy 12/23  Continue aspirin, plavix  Acute blood loss anemia on chronic anemia of chronic kidney disease 3u pRBC transfused  Hg stable No sign of overt bleeding  HLD Continue lipitor   HTN Continue coreg  Volume down at dialysis  ESRD on dialysis Monday Wednesday Friday Nephrology following Hemodialysis today 08/16/2018  DM with hyperglycemia Lantus, SSI  Mood disorder Continue lamictal, zoloft   Hyponatremia Monitor BMP    DVT prophylaxis: subq hep tid Code Status: Full Family Communication: No family at bedside Disposition Plan:  To home when vascular surgery and nephrology sign off.   Consultants:   Vascular surgery  Nephrology  Orthopedic surgery   Procedures:   S/p RLE arteriogram with peroneal atherectomy with subsequent calf hematoma evac and 4 compartment fasciotomy Dr. Donnetta Hutching 12/23  S/p right fifth toe amputation with fasciotomy closure 12/26     Objective: Vitals:   08/16/18 1000 08/16/18 1030 08/16/18 1058 08/16/18 1138  BP: (!) 161/56 (!) 156/35 (!) 116/59 (!) 127/26  Pulse: 93 89 92 91  Resp:   (!) 21   Temp:   98.6 F (37 C) 98.8  F (37.1 C)  TempSrc:   Oral Oral  SpO2:   100%   Weight:   83.1 kg   Height:        Intake/Output Summary (Last 24 hours) at 08/16/2018 1522 Last data filed at 08/16/2018 1203 Gross per 24 hour  Intake 443 ml  Output 2798 ml  Net -2355 ml   Filed  Weights   08/16/18 0547 08/16/18 0643 08/16/18 1058  Weight: 83.2 kg 85.9 kg 83.1 kg    Exam:  . General: 43 y.o. year-old male well developed well-nourished.  Appears uncomfortable due to pain in his foot.  Alert and oriented x3. . Cardiovascular: Regular rate and rhythm with no rubs or gallops.  No JVD or thyromegaly noted. Marland Kitchen Respiratory: Clear to Auscultation with No Wheezes or Rales.  Good Inspiratory Effort. . Abdomen: Soft nontender nondistended with normal bowel sounds x4 quadrants. . Psychiatry: Mood is appropriate for condition and setting.   Data Reviewed: CBC: Recent Labs  Lab 08/12/18 0313 08/13/18 0328 08/14/18 0811 08/15/18 0819 08/16/18 0628  WBC 16.4* 14.6* 15.3* 13.5* 13.5*  HGB 7.8* 8.1* 8.0* 8.1* 7.9*  HCT 24.1* 25.2* 25.1* 25.1* 24.7*  MCV 87.0 89.0 89.3 90.0 89.5  PLT 386 477* 534* 598* 427*   Basic Metabolic Panel: Recent Labs  Lab 08/11/18 0348 08/12/18 0313 08/13/18 0328 08/14/18 0811 08/16/18 0628  NA 132* 129* 133* 130* 131*  K 4.4 4.3 4.3 4.2 3.9  CL 91* 89* 94* 91* 95*  CO2 27 25 27 25 27   GLUCOSE 297* 227* 144* 196* 95  BUN 30* 37* 24* 41* 48*  CREATININE 7.89* 9.73* 6.41* 8.91* 8.45*  CALCIUM 8.1* 8.6* 8.4* 8.5* 8.9  PHOS 4.9* 4.8* 4.4 4.9* 3.5   GFR: Estimated Creatinine Clearance: 13.4 mL/min (A) (by C-G formula based on SCr of 8.45 mg/dL (H)). Liver Function Tests: Recent Labs  Lab 08/11/18 0348 08/12/18 0313 08/13/18 0328 08/14/18 0811 08/16/18 0628  ALBUMIN 2.2* 2.2* 2.3* 2.3* 2.3*   No results for input(s): LIPASE, AMYLASE in the last 168 hours. No results for input(s): AMMONIA in the last 168 hours. Coagulation Profile: No results for input(s): INR, PROTIME in the last 168 hours. Cardiac Enzymes: No results for input(s): CKTOTAL, CKMB, CKMBINDEX, TROPONINI in the last 168 hours. BNP (last 3 results) No results for input(s): PROBNP in the last 8760 hours. HbA1C: No results for input(s): HGBA1C in the last 72  hours. CBG: Recent Labs  Lab 08/15/18 1157 08/15/18 1629 08/15/18 2040 08/16/18 0614 08/16/18 1143  GLUCAP 170* 166* 137* 100* 221*   Lipid Profile: No results for input(s): CHOL, HDL, LDLCALC, TRIG, CHOLHDL, LDLDIRECT in the last 72 hours. Thyroid Function Tests: No results for input(s): TSH, T4TOTAL, FREET4, T3FREE, THYROIDAB in the last 72 hours. Anemia Panel: No results for input(s): VITAMINB12, FOLATE, FERRITIN, TIBC, IRON, RETICCTPCT in the last 72 hours. Urine analysis:    Component Value Date/Time   COLORURINE RED BIOCHEMICALS MAY BE AFFECTED BY COLOR (A) 01/29/2010 0939   APPEARANCEUR CLOUDY (A) 01/29/2010 0939   LABSPEC 1.020 01/29/2010 0939   PHURINE 8.0 01/29/2010 0939   GLUCOSEU NEGATIVE 01/29/2010 0939   HGBUR LARGE (A) 01/29/2010 0939   BILIRUBINUR MODERATE (A) 01/29/2010 0939   KETONESUR 15 (A) 01/29/2010 0939   PROTEINUR >300 (A) 01/29/2010 0939   UROBILINOGEN 0.2 01/29/2010 0939   NITRITE POSITIVE (A) 01/29/2010 0939   LEUKOCYTESUR LARGE (A) 01/29/2010 0939   Sepsis Labs: @LABRCNTIP (procalcitonin:4,lacticidven:4)  ) Recent Results (from the past 240 hour(s))  Aerobic/Anaerobic Culture (surgical/deep wound)     Status: None   Collection Time: 08/10/18 12:22 PM  Result Value Ref Range Status   Specimen Description TOE RIGHT  Final   Special Requests 5TH  Final   Gram Stain NO WBC SEEN NO ORGANISMS SEEN   Final   Culture   Final    FEW CITROBACTER KOSERI RARE METHICILLIN RESISTANT STAPHYLOCOCCUS AUREUS NO ANAEROBES ISOLATED Performed at [facility], [place] [address] [place], [place] [address]    Report Status 08/15/2018 FINAL  Final   Organism ID, Bacteria CITROBACTER KOSERI  Final   Organism ID, Bacteria METHICILLIN RESISTANT STAPHYLOCOCCUS AUREUS  Final      Susceptibility   Citrobacter koseri - MIC*    CEFAZOLIN <=4 SENSITIVE Sensitive     CEFEPIME <=1 SENSITIVE Sensitive     CEFTAZIDIME <=1 SENSITIVE Sensitive     CEFTRIAXONE <=1  SENSITIVE Sensitive     CIPROFLOXACIN <=0.25 SENSITIVE Sensitive     GENTAMICIN <=1 SENSITIVE Sensitive     IMIPENEM <=0.25 SENSITIVE Sensitive     TRIMETH/SULFA <=20 SENSITIVE Sensitive     PIP/TAZO <=4 SENSITIVE Sensitive     * FEW CITROBACTER KOSERI   Methicillin resistant staphylococcus aureus - MIC*    CIPROFLOXACIN >=8 RESISTANT Resistant     ERYTHROMYCIN >=8 RESISTANT Resistant     GENTAMICIN <=0.5 SENSITIVE Sensitive     OXACILLIN >=4 RESISTANT Resistant     TETRACYCLINE <=1 SENSITIVE Sensitive     VANCOMYCIN <=0.5 SENSITIVE Sensitive     TRIMETH/SULFA <=10 SENSITIVE Sensitive     CLINDAMYCIN <=0.25 SENSITIVE Sensitive     RIFAMPIN <=0.5 SENSITIVE Sensitive     Inducible Clindamycin NEGATIVE Sensitive     * RARE METHICILLIN RESISTANT STAPHYLOCOCCUS AUREUS      Studies: No results found.  Scheduled Meds: . sodium chloride   Intravenous Once  . aspirin EC  81 mg Oral Daily  . atorvastatin  80 mg Oral q1800  . calcitRIOL      . calcitRIOL      . calcitRIOL  1.25 mcg Oral Q M,W,F-HD  . carvedilol  3.125 mg Oral Daily  . Chlorhexidine Gluconate Cloth  6 each Topical Q0600  . clopidogrel  75 mg Oral Daily  . [START ON 08/21/2018] darbepoetin (ARANESP) injection - DIALYSIS  200 mcg Intravenous Q Mon-HD  . feeding supplement (PRO-STAT SUGAR FREE 64)  30 mL Oral BID  . heparin  5,000 Units Subcutaneous Q8H  . HYDROmorphone      . insulin aspart  0-5 Units Subcutaneous QHS  . insulin aspart  0-9 Units Subcutaneous TID WC  . insulin glargine  16 Units Subcutaneous QHS  . lamoTRIgine  150 mg Oral QHS  . lanthanum  2,000 mg Oral TID WC  . multivitamin  1 tablet Oral QHS  . sertraline  50 mg Oral Daily  . sodium chloride flush  3 mL Intravenous Q12H  . sulfamethoxazole-trimethoprim  1 tablet Oral Q2000    Continuous Infusions: . sodium chloride 250 mL (08/05/18 2244)  . sodium chloride       LOS: 11 days     Kayleen Memos, MD [facility] Pager  [phone]  If 7PM-7AM, please contact night-coverage [email or web address] Password [email or web address] 08/16/2018, 3:22 PM

## 2018-08-16 NOTE — Progress Notes (Addendum)
[facility] Progress Note   Dialysis Orders: MWF at [facility] 4hr, 200 dialyzer, 500/800, EDW 80kg, 2K/2Ca, UFP #2, AVF, -Heparin 2400units IVbolus -Calcitriol 1.40mcg POTIW - No ESA (Last Hgb 14)  Assessment/Plan: 1. PAD- with right 5th toe gangrene s/p revascularization complicated by compartment syndrome requiring emergent fasciotomies and hematoma evacuation.S/P RLE lateral fasciotomy closure/R ray 5th toe amputation PO day 3. On Septra DS- wound + MRSA/citrobacter 2. ESRDcontinue with HD q MWF.  3. Anemia:s/p ABLA following surgery.S/P 3 units PRBCs. HGB 7.9 stable . Rec'd Aranesp 200 mcg IV 08/14/18. Cont to trend 4. CKD-MBD:Cont VDRA, binders. Ca/P ok 5. Nutrition:Albumin 2.3 renal diet, start prostat, on renal vit. 6. Hypertension:goal appropriate for BP/Na low needs volume down/low dose coreg- still above EDW - continue to titrate to EDW 7. CAD s/p CABG- stable 8. DM type 2- per primary  Myriam Jacobson, PA-C [place] [phone] 08/16/2018,8:47 AM  LOS: 11 days   I have seen and examined this patient and agree with the plan of care. Seen on HD 145/32 cramping earlier with UF turned off at that time 4/2.25 UF goal was 3.2L net but not likely to achieve Pain in the leg still present but improving.  Dwana Melena, MD 08/16/2018, 9:47 AM   Subjective:   C/o of pain in right foot amp site. Eating so - so.  Last BM 2 days ago.  Objective Vitals:   08/16/18 0650 08/16/18 0700 08/16/18 0730 08/16/18 0800  BP: (!) 181/86 (!) 166/89 (!) 164/81 (!) (P) 158/76  Pulse: 83 84 85 (P) 85  Resp: 18 18    Temp:      TempSrc:      SpO2:      Weight:      Height:       Physical Exam goal 4.5 General: supine on HD uncomfortable Heart: RRR Lungs: clear anteriorly Abdomen: soft NT Extremities: no let BKA edema right 5th toe amp site - scant serosanguinous drainage Dialysis Access: left AVF Qb 400   Additional  Objective Labs: Basic Metabolic Panel: Recent Labs  Lab 08/13/18 0328 08/14/18 0811 08/16/18 0628  NA 133* 130* 131*  K 4.3 4.2 3.9  CL 94* 91* 95*  CO2 27 25 27   GLUCOSE 144* 196* 95  BUN 24* 41* 48*  CREATININE 6.41* 8.91* 8.45*  CALCIUM 8.4* 8.5* 8.9  PHOS 4.4 4.9* 3.5   Liver Function Tests: Recent Labs  Lab 08/13/18 0328 08/14/18 0811 08/16/18 0628  ALBUMIN 2.3* 2.3* 2.3*   No results for input(s): LIPASE, AMYLASE in the last 168 hours. CBC: Recent Labs  Lab 08/12/18 0313 08/13/18 0328 08/14/18 0811 08/15/18 0819 08/16/18 0628  WBC 16.4* 14.6* 15.3* 13.5* 13.5*  HGB 7.8* 8.1* 8.0* 8.1* 7.9*  HCT 24.1* 25.2* 25.1* 25.1* 24.7*  MCV 87.0 89.0 89.3 90.0 89.5  PLT 386 477* 534* 598* 655*   Blood Culture    Component Value Date/Time   SDES TOE RIGHT 08/10/2018 1222   SPECREQUEST 5TH 08/10/2018 1222   CULT  08/10/2018 1222    FEW CITROBACTER KOSERI RARE METHICILLIN RESISTANT STAPHYLOCOCCUS AUREUS NO ANAEROBES ISOLATED Performed at [facility], [place] [address] [place], [place] [address]    REPTSTATUS 08/15/2018 FINAL 08/10/2018 1222    Cardiac Enzymes: No results for input(s): CKTOTAL, CKMB, CKMBINDEX, TROPONINI in the last 168 hours. CBG: Recent Labs  Lab 08/15/18 0617 08/15/18 1157 08/15/18 1629 08/15/18 2040 08/16/18 0614  GLUCAP 91 170* 166* 137* 100*  Iron Studies: No results for input(s): IRON, TIBC, TRANSFERRIN, FERRITIN in the last 72 hours. Lab Results  Component Value Date   INR 1.25 11/04/2017   INR 1.00 10/30/2017   INR 1.05 09/13/2014   Studies/Results: No results found. Medications: . sodium chloride 250 mL (08/05/18 2244)  . sodium chloride    . sodium chloride    . sodium chloride     . sodium chloride   Intravenous Once  . aspirin EC  81 mg Oral Daily  . atorvastatin  80 mg Oral q1800  . calcitRIOL  1.25 mcg Oral Q M,W,F-HD  . carvedilol  3.125 mg Oral Daily  . Chlorhexidine Gluconate Cloth  6 each Topical  Q0600  . clopidogrel  75 mg Oral Daily  . [START ON 08/21/2018] darbepoetin (ARANESP) injection - DIALYSIS  200 mcg Intravenous Q Mon-HD  . feeding supplement (PRO-STAT SUGAR FREE 64)  30 mL Oral BID  . heparin  5,000 Units Subcutaneous Q8H  . insulin aspart  0-5 Units Subcutaneous QHS  . insulin aspart  0-9 Units Subcutaneous TID WC  . insulin glargine  16 Units Subcutaneous QHS  . lamoTRIgine  150 mg Oral QHS  . lanthanum  2,000 mg Oral TID WC  . multivitamin  1 tablet Oral QHS  . sertraline  50 mg Oral Daily  . sodium chloride flush  3 mL Intravenous Q12H  . sulfamethoxazole-trimethoprim  1 tablet Oral Q2000

## 2018-08-16 NOTE — Progress Notes (Signed)
PT Cancellation Note  Patient Details Name: [name] Niehoff Sr. MRN: [id number] DOB: June 06, 1976   Cancelled Treatment:    Reason Eval/Treat Not Completed: Patient at procedure or test/unavailable(HD)   [name] B [name] 08/16/2018, 7:29 AM  [name], PT Acute Rehabilitation Services Pager: [phone] Office: [phone]

## 2018-08-17 ENCOUNTER — Telehealth: Payer: Self-pay | Admitting: Vascular Surgery

## 2018-08-17 LAB — CBC
HCT: 25.7 % — ABNORMAL LOW (ref 39.0–52.0)
Hemoglobin: 8.3 g/dL — ABNORMAL LOW (ref 13.0–17.0)
MCH: 29 pg (ref 26.0–34.0)
MCHC: 32.3 g/dL (ref 30.0–36.0)
MCV: 89.9 fL (ref 80.0–100.0)
Platelets: 704 10*3/uL — ABNORMAL HIGH (ref 150–400)
RBC: 2.86 MIL/uL — ABNORMAL LOW (ref 4.22–5.81)
RDW: 14.6 % (ref 11.5–15.5)
WBC: 12.9 10*3/uL — ABNORMAL HIGH (ref 4.0–10.5)
nRBC: 0 % (ref 0.0–0.2)

## 2018-08-17 LAB — GLUCOSE, CAPILLARY
Glucose-Capillary: 83 mg/dL (ref 70–99)
Glucose-Capillary: 158 mg/dL — ABNORMAL HIGH (ref 70–99)
Glucose-Capillary: 192 mg/dL — ABNORMAL HIGH (ref 70–99)

## 2018-08-17 MED ORDER — SULFAMETHOXAZOLE-TRIMETHOPRIM 400-80 MG PO TABS: 1.0000 | ORAL_TABLET | Freq: Every day | ORAL | 0 refills | Status: DC

## 2018-08-17 MED ORDER — OXYCODONE-ACETAMINOPHEN 5-325 MG PO TABS: 1.0000 | ORAL_TABLET | Freq: Four times a day (QID) | ORAL | 0 refills | Status: DC | PRN

## 2018-08-17 MED ORDER — OXYCODONE-ACETAMINOPHEN 5-325 MG PO TABS: 1.0000 | ORAL_TABLET | Freq: Three times a day (TID) | ORAL | 0 refills | Status: DC | PRN

## 2018-08-17 MED ORDER — ATORVASTATIN CALCIUM 80 MG PO TABS: 80.0000 mg | ORAL_TABLET | Freq: Every day | ORAL | 0 refills | Status: DC

## 2018-08-17 MED ORDER — PRO-STAT SUGAR FREE PO LIQD: 30.0000 mL | Freq: Two times a day (BID) | ORAL | 0 refills | Status: DC

## 2018-08-17 MED ORDER — CEFDINIR 300 MG PO CAPS: 300.0000 mg | ORAL_CAPSULE | Freq: Every day | ORAL | Status: DC

## 2018-08-17 MED ORDER — RENA-VITE PO TABS: 1.0000 | ORAL_TABLET | Freq: Every day | ORAL | 0 refills | Status: AC

## 2018-08-17 MED ORDER — CARVEDILOL 3.125 MG PO TABS: 3.1250 mg | ORAL_TABLET | Freq: Every day | ORAL | 0 refills | Status: DC

## 2018-08-17 NOTE — Discharge Instructions (Signed)
Toe Amputation, Care After Refer to this sheet in the next few weeks. These instructions provide you with information on caring for yourself after your procedure. Your health care provider may also give you more specific instructions. Your treatment has been planned according to current medical practices, but problems sometimes occur. Call your health care provider if you have any problems or questions after your procedure. What can I expect after the procedure? After your procedure, it is common to have some pain. Pain usually improves within a week. Follow these instructions at home: Medicines  Take your antibiotic medicine as told by your health care provider. Do not stop taking the antibiotic even if you start to feel better.  Take over-the-counter and prescription medicines only as told by your health care provider. Managing pain, stiffness, and swelling   If directed, apply ice to the surgical area: ? Put ice in a plastic bag. ? Place a towel between your skin and the bag. ? Leave the ice on for 20 minutes, 2-3 times a day.  Raise (elevate) your foot so it is above the level of your heart. This helps to reduce swelling.  Try to walk each day. Incision care      Follow instructions from your health care provider about how to take care of your cut from surgery (incision). Make sure you: ? Wash your hands with soap and water before you change your bandage (dressing). If soap and water are not available, use hand sanitizer. ? Change your dressing as told by your health care provider. ? If you got stitches (sutures), leave them in place. They may need to stay in place for 2 weeks or longer.  Check your incision area every day for signs of infection. Check for: ? More redness, swelling, or pain. ? More fluid or blood. ? Warmth. ? Pus or a bad smell. Bathing  Do not take baths, swim, use a hot tub, or soak your foot until your health care provider approves.  You may shower  unless you were told not to. When you shower, keep your dressing dry.  If your dressing has been removed, you may wash your skin with warm water and soap. Driving  Do not drive for 24 hours if you received a sedative.  Do not drive or operate heavy machinery while taking prescription pain medicine. Activity  Do exercises as told by your health care provider.  Return to your normal activities as told by your health care provider. Ask your health care provider what activities are safe for you. General instructions  Do not use any tobacco products, such as cigarettes, chewing tobacco, and e-cigarettes. If you need help quitting, ask your health care provider.  Ask your health care provider about wearing special shoes or using inserts to support your foot.  If you have diabetes, keep your blood sugar under control.  Keep all follow-up visits as told by your health care provider. This is important. Contact a health care provider if:  You have more redness around your incision.  You have more fluid or blood coming from your incision.  Your incision feels warm to the touch.  You have pus or a bad smell coming from your incision.  You have a fever.  Your dressing is soaked with blood.  Your sutures tear or they separate.  You have numbness or tingling in your toes or foot.  Your foot is cool or pale, or it changes color.  Your pain does not improve after you  take your medicine. Get help right away if:  You have pain or swelling that gets worse or does not go away.  You have red streaks on your skin near your toes, foot, or leg.  You have pain in your calf or behind your knee.  You have shortness of breath.  You have chest pain. This information is not intended to replace advice given to you by your health care provider. Make sure you discuss any questions you have with your health care provider. Document Released: 07/14/2015 Document Revised: 04/21/2018 Document  Reviewed: 04/26/2015 Elsevier Interactive Patient Education  2019 [employer].   Toe Amputation Toe amputation is a surgical procedure to remove all or part of a toe. You may have this procedure if:  Tissue in your toe is dying because of poor blood supply.  You have a severe infection in your toe. Removing your toe keeps nearby tissue healthy. If the toe is infected, removing it helps to keep the infection from spreading. Tell a health care provider about:  Any allergies you have.  All medicines you are taking, including vitamins, herbs, eye drops, creams, and over-the-counter medicines.  Any problems you or family members have had with anesthetic medicines.  Any blood disorders you have.  Any surgeries you have had.  Any medical conditions you have.  Whether you are pregnant or may be pregnant. What are the risks? Generally, this is a safe procedure. However, problems may occur, including:  Bleeding.  Buildup of blood and fluid (hematoma).  Infection.  Allergic reactions to medicines.  Tissue death in the flap of skin (flap necrosis).  Trouble with healing.  Minor changes in the way that you walk (your gait).  Feeling pain in the area that was removed (phantom pain). This is rare. What happens before the procedure?  Follow instructions from your health care provider about eating or drinking restrictions.  Ask your health care provider about: ? Changing or stopping your regular medicines. This is especially important if you are taking diabetes medicines or blood thinners. ? Taking medicines such as aspirin and ibuprofen. These medicines can thin your blood. Do not take these medicines before your procedure if your health care provider instructs you not to.  You may be given antibiotic medicine to help prevent infection.  Plan to have someone take you home after the procedure.  If you will be going home right after the procedure, plan to have someone with you  for 24 hours. What happens during the procedure?  You will be given one or more of the following: ? A medicine to help you relax (sedative). ? A medicine to numb the area (local anesthetic). ? A medicine to make you fall asleep (general anesthetic). ? A medicine that is injected into your spine to numb the area below and slightly above the injection site (spinal anesthetic). ? A medicine that is injected into an area of your body to numb everything below the injection site (regional anesthetic).  To reduce your risk of infection: ? Your health care team will wash or sanitize their hands. ? Your skin will be washed with soap.  Your surgeon will mark the area of your toe for removal.  Your surgeon will make a surgical cut (incision) in your toe.  The dead tissue and bone will be removed.  Nerves and vessels will be tied or heated with a special tool to stop bleeding.  The area will be drained and cleaned.  If only part of a  toe is removed, the remaining part will be covered with a flap of skin.  The incision will be treated in one of these ways: ? It will be closed with stitches (sutures). ? It will be left open to heal if there is an infection.  The incision area may be packed with gauze and covered with bandages (dressings).  Tissue samples may be sent to a lab to be examined under a microscope. The procedure may vary among health care providers and hospitals. What happens after the procedure?  Your blood pressure, heart rate, breathing rate, and blood oxygen level will be monitored often until the medicines you were given have worn off.  Your foot will be raised up high (elevated) to relieve swelling.  You will be monitored for pain.  You will be given pain medicines and antibiotics.  Your health care provider or physical therapist will help you to move around as soon as possible.  Do not drive for 24 hours if you received a sedative. This information is not intended  to replace advice given to you by your health care provider. Make sure you discuss any questions you have with your health care provider. Document Released: 07/14/2015 Document Revised: 04/21/2018 Document Reviewed: 04/26/2015 Elsevier Interactive Patient Education  2019 [employer].

## 2018-08-17 NOTE — Telephone Encounter (Signed)
sch appt spk to pt lvm mld ltr 08/29/2018 830am p/o MD

## 2018-08-17 NOTE — Progress Notes (Signed)
Physical Therapy Treatment Patient Details Name: [name] Balderson Sr. MRN: [id number] DOB: 06/09/1976 Today's Date: 08/17/2018    History of Present Illness Pt is a 43 y.o. male admitted 08/05/18 with R foot gangrene. S/p RLE arteriogram and arthrectomy 12/23; had post-op R calf hematoma s/p evacuation and 4 compartment fasciotomy on 12/23. S/p R 5th toe amputation and RLE fasciotomy closure on 12/26. 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    PT Comments    Pt progressing with mobility. Able to stand and take steps with RW and min guard; remains limited by c/o R foot pain and R calf stiffness. Educ on BLE therex, including RLE gastroc stretch. Pt was using crutches at home to offload R foot pain; pt agrees he would benefit from RW for added stability. Will have assist from girlfriend upon return home. If to remain admitted, will continue to follow acutely.    Follow Up Recommendations  No PT follow up;Supervision for mobility/OOB     Equipment Recommendations  Rolling walker with 5" wheels    Recommendations for Other Services       Precautions / Restrictions Precautions Precautions: Fall Precaution Comments: Old L BKA with prosthetic in room Required Braces or Orthoses: Other Brace Other Brace: R foot darco shoe Restrictions Other Position/Activity Restrictions: Assumed RLE WB through heel only with darco shoe     Mobility  Bed Mobility Overal bed mobility: Independent                Transfers Overall transfer level: Needs assistance Equipment used: Rolling walker (2 wheeled) Transfers: Sit to/from Stand Sit to Stand: Min guard         General transfer comment: Able to stand well with heavy reliance on BUE support to push into standing; once up, required min guard for transition of BUEs from bed to RW  Ambulation/Gait Ambulation/Gait assistance: Supervision Gait Distance (Feet): 2 Feet Assistive device: Rolling walker (2  wheeled) Gait Pattern/deviations: Step-to pattern;Antalgic;Trunk flexed Gait velocity: Decreased   General Gait Details: Able to take steps forward and side steps at EOB with RW and supervision; heavy reliance on BUE support, c/o R foot pain. Cues for WB through heel with darco shoe as pt attempting to point toes down due to calf tightness   Stairs             Wheelchair Mobility    Modified Rankin (Stroke Patients Only)       Balance Overall balance assessment: Needs assistance   Sitting balance-Leahy Scale: Good Sitting balance - Comments: Indep to don R foot darco shoe and LLE prosthetic sitting EOB     Standing balance-Leahy Scale: Poor Standing balance comment: reliant on UE support to maintain standing balance                            Cognition Arousal/Alertness: Awake/alert Behavior During Therapy: WFL for tasks assessed/performed Overall Cognitive Status: Within Functional Limits for tasks assessed                                        Exercises Other Exercises Other Exercises: R calf stretch (AAROM with sheet) while sitting EOB; bilateral LAQ and seated hip flexion    General Comments        Pertinent Vitals/Pain Pain Assessment: 0-10 Pain Score: 6  Pain Location: R foot and calf Pain Descriptors / Indicators: Guarding;Sore Pain Intervention(s): Monitored during session;Premedicated before session    Home Living                      Prior Function            PT Goals (current goals can now be found in the care plan section) Acute Rehab PT Goals Patient Stated Goal: return home PT Goal Formulation: With patient Time For Goal Achievement: 08/18/18 Potential to Achieve Goals: Good Progress towards PT goals: Progressing toward goals    Frequency    Min 3X/week      PT Plan Current plan remains appropriate    Co-evaluation              AM-PAC PT "6 Clicks" Mobility   Outcome Measure   Help needed turning from your back to your side while in a flat bed without using bedrails?: None Help needed moving from lying on your back to sitting on the side of a flat bed without using bedrails?: None Help needed moving to and from a bed to a chair (including a wheelchair)?: None Help needed standing up from a chair using your arms (e.g., wheelchair or bedside chair)?: A Little Help needed to walk in hospital room?: A Little Help needed climbing 3-5 steps with a railing? : Total 6 Click Score: 19    End of Session Equipment Utilized During Treatment: Other (comment)(R darco shoe) Activity Tolerance: Patient tolerated treatment well;Patient limited by pain Patient left: in bed;with call bell/phone within reach Nurse Communication: Mobility status PT Visit Diagnosis: Other abnormalities of gait and mobility (R26.89);Pain Pain - Right/Left: Right Pain - part of body: Leg;Ankle and joints of foot     Time: 2947-6546 PT Time Calculation (min) (ACUTE ONLY): 21 min  Charges:  $Therapeutic Exercise: 8-22 mins                    Mabeline Caras, PT, DPT Acute Rehabilitation Services  Pager [phone] Office [place] 08/17/2018, 1:31 PM

## 2018-08-17 NOTE — Progress Notes (Signed)
CSW provided patient was taxi voucher.    Thurmond Butts, [place] Social Worker [phone]

## 2018-08-17 NOTE — Progress Notes (Signed)
[facility] Progress Note   Subjective: Sleeping, easily aroused. No C/Os.     Objective Vitals:   08/16/18 1138 08/16/18 1725 08/16/18 2024 08/17/18 0439  BP: (!) 127/26 (!) 131/38 (!) 139/28 (!) 160/61  Pulse: 91  77   Resp:  14 (!) 9 16  Temp: 98.8 F (37.1 C)  (!) 97.5 F (36.4 C) 97.9 F (36.6 C)  TempSrc: Oral  Oral Oral  SpO2:   97% 99%  Weight:      Height:       Physical Exam General: Chronically ill appearing male in NAD Heart:S1,S2 SR on monitor Lungs: CTAB A/P Abdomen: Active BS  Extremities: RLE incision open to air, well approximated. No drainage. Suture R toes intact R foot, no drainage.  Dialysis Access: L AVF + bruit   Additional Objective Labs: Basic Metabolic Panel: Recent Labs  Lab 08/13/18 0328 08/14/18 0811 08/16/18 0628  NA 133* 130* 131*  K 4.3 4.2 3.9  CL 94* 91* 95*  CO2 27 25 27   GLUCOSE 144* 196* 95  BUN 24* 41* 48*  CREATININE 6.41* 8.91* 8.45*  CALCIUM 8.4* 8.5* 8.9  PHOS 4.4 4.9* 3.5   Liver Function Tests: Recent Labs  Lab 08/13/18 0328 08/14/18 0811 08/16/18 0628  ALBUMIN 2.3* 2.3* 2.3*   No results for input(s): LIPASE, AMYLASE in the last 168 hours. CBC: Recent Labs  Lab 08/13/18 0328 08/14/18 0811 08/15/18 0819 08/16/18 0628 08/17/18 0311  WBC 14.6* 15.3* 13.5* 13.5* 12.9*  HGB 8.1* 8.0* 8.1* 7.9* 8.3*  HCT 25.2* 25.1* 25.1* 24.7* 25.7*  MCV 89.0 89.3 90.0 89.5 89.9  PLT 477* 534* 598* 655* 704*   Blood Culture    Component Value Date/Time   SDES TOE RIGHT 08/10/2018 1222   SPECREQUEST 5TH 08/10/2018 1222   CULT  08/10/2018 1222    FEW CITROBACTER KOSERI RARE METHICILLIN RESISTANT STAPHYLOCOCCUS AUREUS NO ANAEROBES ISOLATED Performed at [facility], [place] [address], [place], [place] [address]    REPTSTATUS 08/15/2018 FINAL 08/10/2018 1222    Cardiac Enzymes: No results for input(s): CKTOTAL, CKMB, CKMBINDEX, TROPONINI in the last 168 hours. CBG: Recent Labs  Lab  08/16/18 1143 08/16/18 1631 08/16/18 2154 08/17/18 0509 08/17/18 0611  GLUCAP 221* 130* 296* 83 158*   Iron Studies: No results for input(s): IRON, TIBC, TRANSFERRIN, FERRITIN in the last 72 hours. @lablastinr3 @ Studies/Results: No results found. Medications: . sodium chloride 250 mL (08/05/18 2244)  . sodium chloride     . sodium chloride   Intravenous Once  . aspirin EC  81 mg Oral Daily  . atorvastatin  80 mg Oral q1800  . calcitRIOL  1.25 mcg Oral Q M,W,F-HD  . carvedilol  3.125 mg Oral Daily  . Chlorhexidine Gluconate Cloth  6 each Topical Q0600  . clopidogrel  75 mg Oral Daily  . [START ON 08/21/2018] darbepoetin (ARANESP) injection - DIALYSIS  200 mcg Intravenous Q Mon-HD  . feeding supplement (PRO-STAT SUGAR FREE 64)  30 mL Oral BID  . heparin  5,000 Units Subcutaneous Q8H  . insulin aspart  0-5 Units Subcutaneous QHS  . insulin aspart  0-9 Units Subcutaneous TID WC  . insulin glargine  16 Units Subcutaneous QHS  . lamoTRIgine  150 mg Oral QHS  . lanthanum  2,000 mg Oral TID WC  . multivitamin  1 tablet Oral QHS  . sertraline  50 mg Oral Daily  . sodium chloride flush  3 mL Intravenous Q12H  . sulfamethoxazole-trimethoprim  1  tablet Oral Q2000   HD orders: MWF at [facility] 4hr, 200 dialyzer, 500/800, EDW 80kg, 2K/2Ca, UFP #2, AVF, -Heparin 2400units IVbolus -Calcitriol 1.15mcg POTIW - No ESA (Last Hgb 14)  Assessment/Plan: 1. PAD- with right 5th toe gangrene s/p revascularization complicated by compartment syndrome requiring emergent fasciotomies and hematoma evacuation.S/P RLE lateral fasciotomy closure/R ray 5th toe amputation. . On Septra DS- wound + MRSA/citrobacter 2. ESRDcontinue with HD q MWF.  3. Anemia:s/p ABLA following surgery.S/P 3 units PRBCs. HGB 8.3.Rec'd Aranesp 200 mcg IV 08/14/18. Follow HGB. 4. CKD-MBD:Cont VDRA, binders.Ca 8.9 Phos 3.5 Continue binders, VDRA 5. Nutrition:Albumin 2.3renal diet, start prostat, on renal  vit. 6. Hypertension:HD 08/16/18: Pre wt 85.9 kg Net UF 2798 Post wt 83.1 Kg. BP improved but could still have volume lowered more. HD tomorrow 2.5-3.5 liters.  7. CAD s/p CABG- stable 8.   DM type 2- per primary   Alexiah Koroma H. Carlisle Enke NP-C 08/17/2018, 10:04 AM  [employer] [phone]

## 2018-08-17 NOTE — Discharge Summary (Signed)
Discharge Summary  [name] Sahakian Sr. URK:[id number] DOB: 1976-04-12  PCP: Patient, [name] Per  Admit date: 08/04/2018 Discharge date: 08/17/2018  Time spent: 35 minutes  Recommendations for Outpatient Follow-up:  1. Follow-up with vascular surgery 2. Follow-up with nephrology 3. Follow-up with your PCP Keep your hemodialysis appointments    Discharge Diagnoses:  Active Hospital Problems   Diagnosis Date Noted  . Gangrene of toe of left foot ([place]) 08/05/2018  . DM (diabetes mellitus), type 2 with renal complications ([place]) 83/15/1761  . Gangrene of foot ([place]) 08/05/2018  . ESRD (end stage renal disease) on dialysis ([place]) 08/04/2018  . 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 PVD (peripheral vascular disease) ([place])   . Chronic combined systolic and diastolic CHF (congestive heart failure) ([place])   . Essential hypertension   . ESRD on dialysis [facility]) 08/07/2013    Resolved Hospital Problems  No resolved problems to display.    Discharge Condition: Stable  Diet recommendation: Resume previous diet renal dialysis diet  Vitals:   08/16/18 2024 08/17/18 0439  BP: (!) 139/28 (!) 160/61  Pulse: 77   Resp: (!) 9 16  Temp: (!) 97.5 F (36.4 C) 97.9 F (36.6 C)  SpO2: 97% 99%    History of present illness:  [name] Francois a 43 y.o.malewithhistory of CAD status post CABG, ESRD on hemodialysis Monday Wednesday Friday, diabetes mellitus type 2, peripheral vascular disease status post amputation of multiple toes, hypertension presents to the ER because of increasing pain in the right foot with discoloration of his right foot small toe. MRI positive for osteomyelitis of 4th and 5th digits. Underwent right lower extremity arteriogram with peroneal atherectomy with subsequent calf hematoma which required evacuation and 4 compartment fasciotomy on 08/07/18.  Hospital course complicated by fluid overload requiring multiple hemodialysis sessions.  Last hemodialysis while  inpatient was on 08/16/2018.  08/17/2018: Patient seen and examined at bedside.  No acute events overnight.  43 y.o. year-old male well developed well nourished in no acute distress.  He has no new complaints.  On the day of discharge the patient was hemodynamically stable.  He will need to follow-up with his PCP, nephrologist, and keep his hemodialysis appointments as scheduled.    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 status post 5th toe amputation on 08/10/2018 S/pright fifth toe amputation with fasciotomy closure 08/10/18 Pain management in place Percocet every 6 hours as needed for moderate to severe pain Was treated with Vanco/zosyn initially,transitioned to Augmentin Wound culture revealed Citrobacter and MRSA Switched to Bactrim; will continue Bactrim until follows up with vascular surgery in 2 weeks If wound continues to heal well then antibiotic can be discontinued at that time Cultures negative to date and WBC continues to trend down Pain is well controlled on current pain medications Follow-up with vascular surgery in 2 weeks  PAD S/p RLE arteriogram with peroneal atherectomy with subsequent calf hematoma evacuation and 4 compartment fasciotomy 08/07/18  Continue aspirin, plavix  Acute blood loss anemia on chronic anemia of chronic kidney disease 3u pRBC  transfused Hg stable at 8.3 No sign of overt bleeding  HLD Continue lipitor   HTN Continue coreg  Volume down at dialysis  ESRD on dialysis Monday Wednesday Friday Nephrology followed Last inpatient  hemodialysis 08/16/2018 Keep your hemodialysis appointments  DM with hyperglycemia Lantus, SSI  Mood disorder Continue lamictal, zoloft   Hyponatremia Keep your hemodialysis appointments Follow-up with nephrology   Code Status:Full    Consultants:  Vascular surgery  Nephrology  Orthopedic surgery   Procedures:  S/p RLE arteriogram with peroneal atherectomy with subsequent calf hematoma evac and 4 compartment fasciotomy Dr. Early12/23/19  S/pright fifth toe amputation with fasciotomy closure 08/10/18    Discharge Exam: BP (!) 160/61 (BP Location: Right Arm)   Pulse 77   Temp 97.9 F (36.6 C) (Oral)   Resp 16   Ht 6\' 2"  (1.88 m)   Wt 83.1 kg   SpO2 99%   BMI 23.52 kg/m  . General: 43 y.o. year-old male well developed well nourished in no acute distress.  Alert and oriented x3. . Cardiovascular: Regular rate and rhythm with no rubs or gallops.  No thyromegaly or JVD noted.   Marland Kitchen Respiratory: Clear to auscultation with no wheezes or rales. Good inspiratory effort. . Abdomen: Soft nontender nondistended with normal bowel sounds x4 quadrants. . Musculoskeletal: Right amputation site appears clean with no drainage.  Left below the knee amputation. Marland Kitchen Psychiatry: Mood is appropriate for condition and setting  Discharge Instructions You were cared for by a hospitalist during your hospital stay. If you have any questions about your discharge medications or the care you received while you were in the hospital after you are discharged, you can call the unit and asked to speak with the hospitalist on call if the hospitalist that took care of you is not available. Once you are discharged, your primary care physician will handle any further medical issues. Please note that NO REFILLS for any discharge medications will be authorized once you are discharged, as it is imperative that you return to your primary care physician (or establish a relationship with a primary care  physician if you do not have one) for your aftercare needs so that they can reassess your need for medications and monitor your lab values.   Allergies as of 08/17/2018      Reactions   Coconut Oil Anaphylaxis   Can use topically, allergic to coconut foods       Medication List    STOP taking these medications   calcium acetate 667 MG capsule Commonly known as:  PHOSLO     TAKE these medications   aspirin EC 81 MG tablet Take 1 tablet (81 mg total) by mouth daily.   atorvastatin 80 MG tablet Commonly known as:  LIPITOR Take 1 tablet (80 mg total) by mouth daily at 6 PM.   calcitRIOL 0.25 MCG capsule Commonly known as:  ROCALTROL Take 5 capsules (1.25 mcg total) by mouth every Monday, Wednesday, and Friday with hemodialysis.   carvedilol 3.125 MG tablet Commonly known as:  COREG Take 1 tablet (3.125 mg total) by mouth daily. Start taking on:  August 18, 2018 What changed:    medication strength  how much to take  when to take this   cholecalciferol 25 MCG (1000 UT) tablet Commonly known as:  VITAMIN D3 Take 1,000 Units by mouth daily.   clopidogrel 75 MG tablet Commonly known as:  PLAVIX Take 1 tablet (75 mg total) by mouth daily.   Darbepoetin Alfa 60  MCG/0.3ML Sosy injection Commonly known as:  ARANESP Inject 0.3 mLs (60 mcg total) into the vein every Tuesday with hemodialysis. What changed:    when to take this  additional instructions   docusate sodium 100 MG capsule Commonly known as:  COLACE Take 100 mg by mouth daily.   feeding supplement (PRO-STAT SUGAR FREE 64) Liqd Take 30 mLs by mouth 2 (two) times daily.   glucagon 1 MG injection Commonly known as:  GLUCAGON EMERGENCY Inject 1 mg into the vein once as needed (low blood sugar and inability to eat or drink sugar).   insulin aspart 100 UNIT/ML injection Commonly known as:  novoLOG Sliding scale CBG 70 - 120: 0 units CBG 121 - 150: 1 unit,  CBG 151 - 200: 2 units,  CBG 201 - 250: 3 units,   CBG 251 - 300: 5 units,  CBG 301 - 350: 7 units,  CBG 351 - 400: 9 units   CBG > 400: 9 units and notify your MD   insulin glargine 100 UNIT/ML injection Commonly known as:  LANTUS Inject 0.07 mLs (7 Units total) into the skin at bedtime. What changed:  how much to take   ipratropium-albuterol 0.5-2.5 (3) MG/3ML Soln Commonly known as:  DUONEB Take 3 mLs by nebulization 3 (three) times daily as needed (for shortness of breath).   lamoTRIgine 150 MG tablet Commonly known as:  LAMICTAL Take 150 mg by mouth at bedtime.   lanthanum 1000 MG chewable tablet Commonly known as:  FOSRENOL Chew 2 tablets (2,000 mg total) by mouth 3 (three) times daily with meals.   multivitamin Tabs tablet Take 1 tablet by mouth at bedtime.   oxyCODONE-acetaminophen 5-325 MG tablet Commonly known as:  PERCOCET/ROXICET Take 1-2 tablets by mouth every 8 (eight) hours as needed for moderate pain.   sertraline 100 MG tablet Commonly known as:  ZOLOFT Take 50 mg by mouth daily.   sulfamethoxazole-trimethoprim 400-80 MG tablet Commonly known as:  BACTRIM,SEPTRA Take 1 tablet by mouth daily at 8 pm.   ZOFRAN 4 MG tablet Generic drug:  ondansetron Take 4 mg by mouth every 6 (six) hours as needed for nausea.      Allergies  Allergen Reactions  . Coconut Oil Anaphylaxis    Can use topically, allergic to coconut foods    Follow-up Information    [name], MD. Call in 1 day(s).   Specialty:  Vascular Surgery Why:  Patient will need to see vascular surgery within 2 weeks. Please call for a post hospital follow-up appointment Contact information: [place] [address] [phone]        Josue Hector, MD. Call in 1 day(s).   Specialty:  Cardiology Why:  Please call for a post hospital follow-up appointment. Contact information: [address] [place] [address] [phone]            The results of significant diagnostics from this  hospitalization (including imaging, microbiology, ancillary and laboratory) are listed below for reference.    Significant Diagnostic Studies: Dg Chest 1 View  Result Date: 08/04/2018 CLINICAL DATA:  Recent right toe pain. Patient on dialysis. EXAM: CHEST  1 VIEW COMPARISON:  Chest radiograph performed 07/12/2018 FINDINGS: The lungs are well-aerated. Minimal bibasilar atelectasis is noted. There is no evidence of pleural effusion or pneumothorax. The cardiomediastinal silhouette is borderline normal in size. The patient is status post median sternotomy, with evidence of prior CABG. No acute osseous abnormalities are seen. IMPRESSION:  Minimal bibasilar atelectasis noted; lungs otherwise clear. Electronically Signed   By: [name] M.D.   On: 08/04/2018 21:28   Mr Foot Right Wo Contrast  Addendum Date: 08/06/2018   ADDENDUM REPORT: 08/06/2018 09:27 ADDENDUM: These results were called by telephone at the time of interpretation on 08/06/2018 at 9:23 am to Dr. Maylene Roes, who verbally acknowledged these results. Electronically Signed   By: Van Clines M.D.   On: 08/06/2018 09:27   Result Date: 08/06/2018 CLINICAL DATA:  Right foot pain.  Dry gangrene of the small toe. EXAM: MRI OF THE RIGHT FOREFOOT WITHOUT CONTRAST TECHNIQUE: Multiplanar, multisequence MR imaging of the right forefoot was performed. No intravenous contrast was administered. COMPARISON:  Radiographs 08/04/2018 FINDINGS: Despite efforts by the technologist and patient, motion artifact is present on today's exam and could not be eliminated. This reduces exam sensitivity and specificity. Bones/Joint/Cartilage Prior amputation of first and second toes. Accentuated T2 and low T1 signal in the phalanges of the small toe favoring osteomyelitis. Small amount of edema along the medial aspect of the head of the fifth metatarsal could also reflect early osteomyelitis. Reduced T1 signal in the middle and distal phalanges of the fourth toe,  suspicious for osteomyelitis. Ligaments Lisfranc ligament intact. Muscles and Tendons Low-level edema tracks within along the plantar musculature of the foot, notably the quadratus plantae a and flexor digitorum brevis, more likely neurogenic than from myositis. Generalized muscular atrophy. Expansion and some laxity flexor hallucis longus, which is likely discontinuous distally due to the amputation. Soft tissues Poor definition of the soft tissues along the dorsum of the fifth toe. IMPRESSION: 1. 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 Suspected involvement of the fourth toe is supplemental to the original preliminary report. Radiology assistant personnel have been notified to put me in telephone contact with the referring physician or the referring physician's clinical representative in order to discuss these findings. Once this communication is established I will issue an addendum to this report for documentation purposes. 2. Poor definition of the soft tissues of the dorsum of the fifth toe, potentially from either artifact or from actual erosions/absence of tissue. 3. Extensive motion artifact.  Poor fat saturation in the toes. Electronically Signed: By: Van Clines M.D. On: 08/06/2018 09:12   Dg Foot Complete Right  Result Date: 08/04/2018 CLINICAL DATA:  Subacute onset of right fifth toe pain. History of toe amputation. Patient on dialysis. EXAM: RIGHT FOOT COMPLETE - 3+ VIEW COMPARISON:  Right foot radiographs performed 03/07/2017 FINDINGS: There is no evidence of fracture or dislocation. The joint spaces are preserved. No definite osseous erosions are seen to suggest osteomyelitis, though evaluation for osteomyelitis is limited on radiograph. There is no evidence of talar subluxation; the subtalar joint is unremarkable in appearance. Diffuse vascular  calcifications are seen. The patient is status post amputation at the first and second toes. IMPRESSION: 1. No evidence of fracture or dislocation. 2. No definite osseous erosions seen to suggest osteomyelitis, though evaluation for osteomyelitis is limited on radiograph. MRI could be considered for further evaluation to assess for osteomyelitis, as deemed clinically appropriate. 3. Diffuse vascular calcifications seen. Electronically Signed   By: [name] M.D.   On: 08/04/2018 21:30   Vas [place] Burnard Bunting With/wo Tbi  Result Date: 08/05/2018 LOWER EXTREMITY DOPPLER STUDY Indications: Gangrene. High Risk Factors: Hypertension, Diabetes, current smoker. Other Factors: ESRD on  dialysis, left access.  Vascular Interventions: Multiple toe amputations on the right. 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 Comparison Study: Prior study from 11/01/17 is available for comparison Performing Technologist: Sharion Dove RVS  Examination Guidelines: A complete evaluation includes at minimum, Doppler waveform signals and systolic blood pressure reading at the level of bilateral brachial, anterior tibial, and posterior tibial arteries, when vessel segments are accessible. Bilateral testing is considered an integral part of a complete examination. Photoelectric Plethysmograph (PPG) waveforms and toe systolic pressure readings are included as required and additional duplex testing as needed. Limited examinations for reoccurring indications may be performed as noted.  ABI Findings: +---------+------------------+-----+----------+-------------------+ Right    Rt Pressure (mmHg)IndexWaveform  Comment             +---------+------------------+-----+----------+-------------------+ Brachial 255                    biphasic  Blood pressure >255 +---------+------------------+-----+----------+-------------------+ PTA      254               1.00 monophasicNon comp            +---------+------------------+-----+----------+-------------------+ DP        213               0.84 monophasic                    +---------+------------------+-----+----------+-------------------+ Great Toe                                 amputation          +---------+------------------+-----+----------+-------------------+ +--------+------------------+-----+--------+-------+ Left    Lt Pressure (mmHg)IndexWaveformComment +--------+------------------+-----+--------+-------+ Brachial                               access  +--------+------------------+-----+--------+-------+ PTA                                    BKA     +--------+------------------+-----+--------+-------+ DP                                     BKA     +--------+------------------+-----+--------+-------+ +-------+-----------+-----------+------------+------------+ ABI/TBIToday's ABIToday's TBIPrevious ABIPrevious TBI +-------+-----------+-----------+------------+------------+ Right  non comp   Toe amp    non comp    toe amp      +-------+-----------+-----------+------------+------------+ Left   BKA                                            +-------+-----------+-----------+------------+------------+ Right ABIs appear essentially unchanged.  Summary: Right: Resting right ankle-brachial index indicates noncompressible right lower extremity arteries.  *See table(s) above for measurements and observations.  Electronically signed by Monica Martinez MD on 08/05/2018 at 11:01:37 AM.   Final     Microbiology: Recent Results (from the past 240 hour(s))  Aerobic/Anaerobic Culture (surgical/deep wound)     Status: None   Collection Time: 08/10/18 12:22 PM  Result Value Ref Range Status   Specimen Description TOE RIGHT  Final   Special Requests 5TH  Final   Gram Stain NO WBC SEEN NO ORGANISMS SEEN   Final   Culture  Final    FEW CITROBACTER KOSERI RARE METHICILLIN RESISTANT STAPHYLOCOCCUS AUREUS NO ANAEROBES ISOLATED Performed at [facility], [place]  [address], [place], [place] [address]    Report Status 08/15/2018 FINAL  Final   Organism ID, Bacteria CITROBACTER KOSERI  Final   Organism ID, Bacteria METHICILLIN RESISTANT STAPHYLOCOCCUS AUREUS  Final      Susceptibility   Citrobacter koseri - MIC*    CEFAZOLIN <=4 SENSITIVE Sensitive     CEFEPIME <=1 SENSITIVE Sensitive     CEFTAZIDIME <=1 SENSITIVE Sensitive     CEFTRIAXONE <=1 SENSITIVE Sensitive     CIPROFLOXACIN <=0.25 SENSITIVE Sensitive     GENTAMICIN <=1 SENSITIVE Sensitive     IMIPENEM <=0.25 SENSITIVE Sensitive     TRIMETH/SULFA <=20 SENSITIVE Sensitive     PIP/TAZO <=4 SENSITIVE Sensitive     * FEW CITROBACTER KOSERI   Methicillin resistant staphylococcus aureus - MIC*    CIPROFLOXACIN >=8 RESISTANT Resistant     ERYTHROMYCIN >=8 RESISTANT Resistant     GENTAMICIN <=0.5 SENSITIVE Sensitive     OXACILLIN >=4 RESISTANT Resistant     TETRACYCLINE <=1 SENSITIVE Sensitive     VANCOMYCIN <=0.5 SENSITIVE Sensitive     TRIMETH/SULFA <=10 SENSITIVE Sensitive     CLINDAMYCIN <=0.25 SENSITIVE Sensitive     RIFAMPIN <=0.5 SENSITIVE Sensitive     Inducible Clindamycin NEGATIVE Sensitive     * RARE METHICILLIN RESISTANT STAPHYLOCOCCUS AUREUS     Labs: Basic Metabolic Panel: Recent Labs  Lab 08/11/18 0348 08/12/18 0313 08/13/18 0328 08/14/18 0811 08/16/18 0628  NA 132* 129* 133* 130* 131*  K 4.4 4.3 4.3 4.2 3.9  CL 91* 89* 94* 91* 95*  CO2 27 25 27 25 27   GLUCOSE 297* 227* 144* 196* 95  BUN 30* 37* 24* 41* 48*  CREATININE 7.89* 9.73* 6.41* 8.91* 8.45*  CALCIUM 8.1* 8.6* 8.4* 8.5* 8.9  PHOS 4.9* 4.8* 4.4 4.9* 3.5   Liver Function Tests: Recent Labs  Lab 08/11/18 0348 08/12/18 0313 08/13/18 0328 08/14/18 0811 08/16/18 0628  ALBUMIN 2.2* 2.2* 2.3* 2.3* 2.3*   No results for input(s): LIPASE, AMYLASE in the last 168 hours. No results for input(s): AMMONIA in the last 168 hours. CBC: Recent Labs  Lab 08/13/18 0328 08/14/18 0811 08/15/18 0819 08/16/18 0628  08/17/18 0311  WBC 14.6* 15.3* 13.5* 13.5* 12.9*  HGB 8.1* 8.0* 8.1* 7.9* 8.3*  HCT 25.2* 25.1* 25.1* 24.7* 25.7*  MCV 89.0 89.3 90.0 89.5 89.9  PLT 477* 534* 598* 655* 704*   Cardiac Enzymes: No results for input(s): CKTOTAL, CKMB, CKMBINDEX, TROPONINI in the last 168 hours. BNP: BNP (last 3 results) No results for input(s): BNP in the last 8760 hours.  ProBNP (last 3 results) No results for input(s): PROBNP in the last 8760 hours.  CBG: Recent Labs  Lab 08/16/18 1143 08/16/18 1631 08/16/18 2154 08/17/18 0509 08/17/18 0611  GLUCAP 221* 130* 296* 83 158*       Signed:  Kayleen Memos, MD [facility] 08/17/2018, 10:45 AM

## 2018-08-17 NOTE — Progress Notes (Addendum)
[facility] [place]  Subjective  - Doing better daily.   Objective (!) 160/61 77 97.9 F (36.6 C) (Oral) 16 99%  Intake/Output Summary (Last 24 hours) at 08/17/2018 0721 Last data filed at 08/16/2018 2000 Gross per 24 hour  Intake 363 ml  Output 2798 ml  Net -2435 ml    Right amputation site appears viable, minimal SS drainage on the dressing.  Dry dressing replaced.  Fasciotomy sites healing well with soft compartments. DP doppler signal right LE Left groin soft  Assessment/Planning:  4 compartment fasciotomy, hematoma evacuation 1.  Right lower extremity lateral fasciotomy closure 2.  Ray amputation of right fifth toe Stable right LE perfusion with healing amputation sites. F/U with Dr. Carlis Abbott in 2 weeks.  Roxy Horseman 08/17/2018 7:21 AM --  Laboratory Lab Results: Recent Labs    08/16/18 0628 08/17/18 0311  WBC 13.5* 12.9*  HGB 7.9* 8.3*  HCT 24.7* 25.7*  PLT 655* 704*   BMET Recent Labs    08/14/18 0811 08/16/18 0628  NA 130* 131*  K 4.2 3.9  CL 91* 95*  CO2 25 27  GLUCOSE 196* 95  BUN 41* 48*  CREATININE 8.91* 8.45*  CALCIUM 8.5* 8.9    COAG Lab Results  Component Value Date   INR 1.25 11/04/2017   INR 1.00 10/30/2017   INR 1.05 09/13/2014   No results found for: PTT  I have seen and evaluated the patient. I agree with the PA note as documented above. Toe healing after 5th toe amp.  Fasciotomies c/d/i.  Robust DP signal.  Ok for discharge from my standpoint.  Will arrange f/u 2-3 weeks wound check.  [name], MD [facility] [place] Office: [phone] Pager: [phone]

## 2018-08-17 NOTE — Care Management Note (Addendum)
Case Management Note Marvetta Gibbons RN, BSN Transitions of Care Unit 4E- RN Case Manager [phone]  Patient Details  Name: [name] Heisler Sr. MRN: [id number] Date of Birth: Oct 11, 1975  Subjective/Objective:     Pt admitted with gangrene of toe and critical limb ischemia, s/p angioplasty  with post op compartment syndrome with hematoma with hematoma evacuation and fasciotomy then closure of fasciotomy.             Action/Plan: PTA pt lived at home, hx of ESRD with HD-mwf. Per pt he is to see Dr. Sarajane Jews on Jan. 6 at 8:00. Order written for [facility]. CM provided pt with CMS website list including star ratings. Per pt he does not have a preference and is ok with using [facility] for services- referral called to Butch Penny with [facility]- referral accepted pending pt's PCP appointment next week.   Expected Discharge Date:  08/17/18               Expected Discharge Plan:  [place]  In-House Referral:  NA  Discharge planning Services  CM Consult  Post Acute Care Choice:  Home Health Choice offered to:  Patient  DME Arranged:   rolling walker DME Agency:   [facility]  HH Arranged:  RN [facility] Agency:  [place]  Status of Service:  Completed, signed off  If discussed at [place] of Stay Meetings, dates discussed:    Discharge Disposition: home/home health   Additional Comments:  08/17/18- update prior to discharge- notified by bedside RN- that pt will need RW for discharge- order has been placed- call made to [facility] with [facility] for DME need- RW to be delivered to room prior to pt discharge.  [name], RN 08/17/2018, 12:07 PM

## 2018-08-17 NOTE — Telephone Encounter (Signed)
-----   Message from Ulyses Amor, [place] sent at 08/17/2018  7:28 AM EST -----  S/P right LE fasciotomy and 4/5th toe amps f/u with Dr. Carlis Abbott in 2 weeks

## 2018-08-23 ENCOUNTER — Other Ambulatory Visit: Payer: Self-pay | Admitting: *Deleted

## 2018-08-23 DIAGNOSIS — Z48812 Encounter for surgical aftercare following surgery on the circulatory system: Secondary | ICD-10-CM

## 2018-08-23 MED ORDER — OXYCODONE-ACETAMINOPHEN 5-325 MG PO TABS: 1.0000 | ORAL_TABLET | Freq: Four times a day (QID) | ORAL | 0 refills | Status: DC | PRN

## 2018-08-29 ENCOUNTER — Other Ambulatory Visit: Payer: Self-pay

## 2018-08-29 ENCOUNTER — Encounter: Payer: Self-pay | Admitting: Vascular Surgery

## 2018-08-29 ENCOUNTER — Ambulatory Visit (INDEPENDENT_AMBULATORY_CARE_PROVIDER_SITE_OTHER): Payer: Medicaid Other | Admitting: Vascular Surgery

## 2018-08-29 VITALS — BP 147/61 | HR 78 | Temp 98.4°F | Resp 18 | Ht 73.0 in | Wt 180.8 lb

## 2018-08-29 DIAGNOSIS — I739 Peripheral vascular disease, unspecified: Secondary | ICD-10-CM

## 2018-08-29 MED ORDER — POVIDONE-IODINE 10 % EX SWAB: 1.0000 "application " | CUTANEOUS | 12 refills | Status: DC | PRN

## 2018-08-29 NOTE — Progress Notes (Signed)
Patient name: [name] Ishaq Sr. MRN: [id number] DOB: 1976-02-06 Sex: male  REASON FOR VISIT: Follow-up after right lower extremity endovascular intervention  HPI: [name] Lehigh Sr. is a 43 y.o. male that underwent right lower extremity arteriogram with atherectomy of his TP trunk and peroneal artery on 08/07/2018 for critical ischemia with tissue loss.  Ultimately he required going to the OR for fasciotomies after he had a hematoma in his popliteal space and subsequently got a right fifth toe amp.  He presents for hospital follow-up.  He remains on aspirin and Plavix.  He reports no new issues with his foot.  Past Medical History:  Diagnosis Date  . Anemia   . Atherosclerosis of lower extremity ([place])   . CAD (coronary artery disease)   . Chronic combined systolic and diastolic heart failure ([place])   . Complication of anesthesia   . ESRD (end stage renal disease) on dialysis [facility])    "MWF [employer] [employer]" (03/08/2017)  . GERD (gastroesophageal reflux disease)   . Heart murmur   . History of blood transfusion    "related to OR"  . Hypertension   . PONV (postoperative nausea and vomiting)   . Type II diabetes mellitus ([place])     Past Surgical History:  Procedure Laterality Date  . ABDOMINAL AORTOGRAM N/A 11/02/2016   Procedure: Abdominal Aortogram;  Surgeon: Waynetta Sandy, MD;  Location: [place] [facility];  Service: Cardiovascular;  Laterality: N/A;  . ABDOMINAL AORTOGRAM W/LOWER EXTREMITY Right 08/07/2018   Procedure: ABDOMINAL AORTOGRAM W/LOWER EXTREMITY;  Surgeon: [name], MD;  Location: [place] [facility];  Service: Cardiovascular;  Laterality: Right;  . AMPUTATION Left 09/27/2013   Procedure: LEFT GREAT TOE AMPUTATION;  Surgeon: [name], MD;  Location: [place];  Service: Orthopedics;  Laterality: Left;  . AMPUTATION Right 08/15/2015   Procedure: Right Great Toe Amputation;  Surgeon: [name], MD;  Location: [place];  Service: Orthopedics;   Laterality: Right;  . AMPUTATION Left 11/05/2016   Procedure: TRANSMETATARSAL AMPUTATION LEFT FOOT;  Surgeon: [name], MD;  Location: [place];  Service: Orthopedics;  Laterality: Left;  . AMPUTATION Left 03/11/2017   Procedure: LEFT BELOW KNEE AMPUTATION;  Surgeon: [name], MD;  Location: [place];  Service: Orthopedics;  Laterality: Left;  . AMPUTATION Right 03/11/2017   Procedure: RIGHT 2ND TOE AMPUTATION;  Surgeon: [name], MD;  Location: [place];  Service: Orthopedics;  Laterality: Right;  . AV FISTULA PLACEMENT  left arm  . CORONARY ARTERY BYPASS GRAFT N/A 11/04/2017   Procedure: CORONARY ARTERY BYPASS GRAFTING (CABG) x three, using left internal mammary artery and right    leg greater saphenous vein;  Surgeon: Melrose Nakayama, MD;  Location: [place];  Service: Open Heart Surgery;  Laterality: N/A;  . FASCIOTOMY Right 08/07/2018   Procedure: FOUR COMPARTMENT FASCIOTOMY OF RIGHT LOWER LEG;  Surgeon: Rosetta Posner, MD;  Location: [place];  Service: Vascular;  Laterality: Right;  . FASCIOTOMY CLOSURE Right 08/10/2018   Procedure: FASCIOTOMY CLOSURE RIGHT LOWER EXTREMITY;  Surgeon: [name], MD;  Location: [place];  Service: Vascular;  Laterality: Right;  . HEMATOMA EVACUATION Right 08/07/2018   Procedure: EVACUATION HEMATOMA;  Surgeon: Rosetta Posner, MD;  Location: [place];  Service: Vascular;  Laterality: Right;  . LEFT HEART CATH AND CORONARY ANGIOGRAPHY N/A 10/31/2017   Procedure: LEFT HEART CATH AND CORONARY ANGIOGRAPHY;  Surgeon: Troy Sine, MD;  Location: [place] [facility]  [facility];  Service: Cardiovascular;  Laterality: N/A;  . LEFT HEART CATHETERIZATION WITH CORONARY ANGIOGRAM N/A 09/13/2014   Procedure: LEFT HEART CATHETERIZATION WITH CORONARY ANGIOGRAM;  Surgeon: Sinclair Grooms, MD;  Location: [facility] C[facility];  Service: Cardiovascular;  Laterality: N/A;  . LOWER EXTREMITY ANGIOGRAPHY Bilateral 11/02/2016   Procedure: Lower Extremity Angiography;  Surgeon: Waynetta Sandy, MD;  Location: [place] [facility];  Service: Cardiovascular;  Laterality: Bilateral;  . PERIPHERAL VASCULAR ATHERECTOMY Left 11/02/2016   Procedure: Peripheral Vascular Atherectomy;  Surgeon: Waynetta Sandy, MD;  Location: [place] [facility];  Service: Cardiovascular;  Laterality: Left;  PERONEAL  . PERIPHERAL VASCULAR ATHERECTOMY Right 08/07/2018   Procedure: PERIPHERAL VASCULAR ATHERECTOMY;  Surgeon: [name], MD;  Location: [place] [facility];  Service: Cardiovascular;  Laterality: Right;  Peroneal artery  . PERIPHERAL VASCULAR BALLOON ANGIOPLASTY Right 08/07/2018   Procedure: PERIPHERAL VASCULAR BALLOON ANGIOPLASTY;  Surgeon: [name], MD;  Location: [place] [facility];  Service: Cardiovascular;  Laterality: Right;  anterior tibial artery  . STUMP REVISION Left 01/11/2017   Procedure: Revision Left Transmetatarsal Amputation;  Surgeon: [name], MD;  Location: [place];  Service: Orthopedics;  Laterality: Left;  . TEE WITHOUT CARDIOVERSION N/A 11/04/2017   Procedure: TRANSESOPHAGEAL ECHOCARDIOGRAM (TEE);  Surgeon: Melrose Nakayama, MD;  Location: [place];  Service: Open Heart Surgery;  Laterality: N/A;  . TRANSMETATARSAL AMPUTATION Right 08/10/2018   Procedure: RIGHT FIFTH TOE AMPUTATION;  Surgeon: [name], MD;  Location: [facility] OR;  Service: Vascular;  Laterality: Right;    Family History  Problem Relation Age of Onset  . Heart failure Mother   . Hypertension Mother     SOCIAL HISTORY: Social History   Tobacco Use  . Smoking status: Current Every Day Smoker    Packs/day: 0.50    Years: 26.00    Pack years: 13.00    Types: Cigarettes  . Smokeless tobacco: Never Used  Substance Use Topics  . Alcohol use: Yes    Comment: 03/08/2017 "haven't took a drink in over 5 years"    Allergies  Allergen Reactions  . Coconut Oil Anaphylaxis    Can use topically, allergic to coconut foods     Current Outpatient Medications    Medication Sig Dispense Refill  . Amino Acids-Protein Hydrolys (FEEDING SUPPLEMENT, PRO-STAT SUGAR FREE 64,) LIQD Take 30 mLs by mouth 2 (two) times daily. 7 Bottle 0  . aspirin EC 81 MG tablet Take 1 tablet (81 mg total) by mouth daily. 30 tablet 11  . atorvastatin (LIPITOR) 80 MG tablet Take 1 tablet (80 mg total) by mouth daily at 6 PM. 30 tablet 0  . calcitRIOL (ROCALTROL) 0.25 MCG capsule Take 5 capsules (1.25 mcg total) by mouth every Monday, Wednesday, and Friday with hemodialysis. 30 capsule 1  . carvedilol (COREG) 3.125 MG tablet Take 1 tablet (3.125 mg total) by mouth daily. 60 tablet 0  . cholecalciferol (VITAMIN D3) 25 MCG (1000 UT) tablet Take 1,000 Units by mouth daily.    . clopidogrel (PLAVIX) 75 MG tablet Take 1 tablet (75 mg total) by mouth daily. 30 tablet 6  . Darbepoetin Alfa (ARANESP) 60 MCG/0.3ML SOSY injection Inject 0.3 mLs (60 mcg total) into the vein every Tuesday with hemodialysis. (Patient taking differently: Inject 60 mcg into the vein See admin instructions. On Tuesday and Thursday with Dialysis) 4.2 mL 1  . docusate sodium (COLACE) 100 MG capsule Take 100 mg by mouth daily.    Marland Kitchen  glucagon (GLUCAGON EMERGENCY) 1 MG injection Inject 1 mg into the vein once as needed (low blood sugar and inability to eat or drink sugar). 1 each 0  . insulin aspart (NOVOLOG) 100 UNIT/ML injection Sliding scale CBG 70 - 120: 0 units CBG 121 - 150: 1 unit,  CBG 151 - 200: 2 units,  CBG 201 - 250: 3 units,  CBG 251 - 300: 5 units,  CBG 301 - 350: 7 units,  CBG 351 - 400: 9 units   CBG > 400: 9 units and notify your MD 10 mL 11  . insulin glargine (LANTUS) 100 UNIT/ML injection Inject 0.07 mLs (7 Units total) into the skin at bedtime. (Patient taking differently: Inject 6 Units into the skin at bedtime. ) 10 mL 12  . ipratropium-albuterol (DUONEB) 0.5-2.5 (3) MG/3ML SOLN Take 3 mLs by nebulization 3 (three) times daily as needed (for shortness of breath).     . lamoTRIgine (LAMICTAL) 150 MG  tablet Take 150 mg by mouth at bedtime.    Marland Kitchen lanthanum (FOSRENOL) 1000 MG chewable tablet Chew 2 tablets (2,000 mg total) by mouth 3 (three) times daily with meals. 180 tablet 0  . multivitamin (RENA-VIT) TABS tablet Take 1 tablet by mouth at bedtime. 30 tablet 0  . ondansetron (ZOFRAN) 4 MG tablet Take 4 mg by mouth every 6 (six) hours as needed for nausea.     . sertraline (ZOLOFT) 100 MG tablet Take 50 mg by mouth daily.    Marland Kitchen sulfamethoxazole-trimethoprim (BACTRIM,SEPTRA) 400-80 MG tablet Take 1 tablet by mouth daily at 8 pm. 14 tablet 0  . oxyCODONE-acetaminophen (PERCOCET/ROXICET) 5-325 MG tablet Take 1 tablet by mouth every 6 (six) hours as needed for moderate pain. (Patient not taking: Reported on 08/29/2018) 20 tablet 0   No current facility-administered medications for this visit.     REVIEW OF SYSTEMS:  [X]  denotes positive finding, [ ]  denotes negative finding Cardiac  Comments:  Chest pain or chest pressure:    Shortness of breath upon exertion:    Short of breath when lying flat:    Irregular heart rhythm:        Vascular    Pain in calf, thigh, or hip brought on by ambulation:    Pain in feet at night that wakes you up from your sleep:     Blood clot in your veins:    Leg swelling:         Pulmonary    Oxygen at home:    Productive cough:     Wheezing:         Neurologic    Sudden weakness in arms or legs:     Sudden numbness in arms or legs:     Sudden onset of difficulty speaking or slurred speech:    Temporary loss of vision in one eye:     Problems with dizziness:         Gastrointestinal    Blood in stool:     Vomited blood:         Genitourinary    Burning when urinating:     Blood in urine:        Psychiatric    Major depression:         Hematologic    Bleeding problems:    Problems with blood clotting too easily:        Skin    Rashes or ulcers:        Constitutional    Fever or  chills:      PHYSICAL EXAM: Vitals:   08/29/18 0826  BP:  (!) 147/61  Pulse: 78  Resp: 18  Temp: 98.4 F (36.9 C)  TempSrc: Oral  SpO2: 99%  Weight: 180 lb 12.4 oz (82 kg)  Height: 6\' 1"  (1.854 m)    GENERAL: The patient is a well-nourished male, in no acute distress. The vital signs are documented above. CARDIAC: There is a regular rate and rhythm.  VASCULAR:  R DP signal brisk L BKA previous well healed Right 5th toe amp with staples medially and sutures laterally Right 5th toe amp medial wound with small eschar PULMONARY: There is good air exchange bilaterally without wheezing or rales. ABDOMEN: Soft and non-tender with normal pitched bowel sounds.  MUSCULOSKELETAL: There are no major deformities or cyanosis.   DATA:   None  Assessment/Plan:  43 year old male with critical limb ischemia the right lower extremity with tissue loss.  He previously underwent right TP trunk and peroneal atherectomy as well as a right fifth toe amp and required fasciotomies for popliteal space hematoma.  Ultimately will remove his sutures and staples from his fasciotomy closures today and sutures from toe amp site.  The toe amp is healing laterally but medially there is a small open wound.  I discussed my concern that this is not healing and ultimately probably needs a higher level amputation.  Patient is opposed to any higher level amputation right now including a TMA or BKA given his young age.  I will put him in Betadine paint daily for now and instructed him to paint the wound with Betadine every day.  I will see him back in a couple weeks to monitor closely.  I think his revascularization is as good as it will be given severe tibial disease - unsuccessful attempt at retrograde cannulation of tibial at ankle and only peroneal runoff.   [name], MD [facility] Office: [phone] Pager: [phone]

## 2018-09-07 ENCOUNTER — Encounter (HOSPITAL_COMMUNITY): Payer: Self-pay

## 2018-09-07 ENCOUNTER — Emergency Department (HOSPITAL_COMMUNITY): Payer: Medicaid Other

## 2018-09-07 ENCOUNTER — Observation Stay (HOSPITAL_COMMUNITY)
Admission: EM | Admit: 2018-09-07 | Discharge: 2018-09-08 | Disposition: A | Discharge status: Home / Self Care | Payer: Medicaid Other | Attending: Internal Medicine | Admitting: Internal Medicine

## 2018-09-07 ENCOUNTER — Other Ambulatory Visit: Payer: Self-pay

## 2018-09-07 DIAGNOSIS — E1022 Type 1 diabetes mellitus with diabetic chronic kidney disease: Secondary | ICD-10-CM | POA: Insufficient documentation

## 2018-09-07 DIAGNOSIS — Z7902 Long term (current) use of antithrombotics/antiplatelets: Secondary | ICD-10-CM | POA: Insufficient documentation

## 2018-09-07 DIAGNOSIS — Z79899 Other long term (current) drug therapy: Secondary | ICD-10-CM | POA: Diagnosis not present

## 2018-09-07 DIAGNOSIS — I253 Aneurysm of heart: Secondary | ICD-10-CM | POA: Diagnosis not present

## 2018-09-07 DIAGNOSIS — I132 Hypertensive heart and chronic kidney disease with heart failure and with stage 5 chronic kidney disease, or end stage renal disease: Secondary | ICD-10-CM | POA: Insufficient documentation

## 2018-09-07 DIAGNOSIS — Z992 Dependence on renal dialysis: Secondary | ICD-10-CM | POA: Insufficient documentation

## 2018-09-07 DIAGNOSIS — E1122 Type 2 diabetes mellitus with diabetic chronic kidney disease: Secondary | ICD-10-CM

## 2018-09-07 DIAGNOSIS — I248 Other forms of acute ischemic heart disease: Secondary | ICD-10-CM

## 2018-09-07 DIAGNOSIS — I200 Unstable angina: Secondary | ICD-10-CM | POA: Diagnosis present

## 2018-09-07 DIAGNOSIS — Z7982 Long term (current) use of aspirin: Secondary | ICD-10-CM | POA: Diagnosis not present

## 2018-09-07 DIAGNOSIS — I10 Essential (primary) hypertension: Secondary | ICD-10-CM | POA: Diagnosis not present

## 2018-09-07 DIAGNOSIS — R079 Chest pain, unspecified: Principal | ICD-10-CM | POA: Insufficient documentation

## 2018-09-07 DIAGNOSIS — I739 Peripheral vascular disease, unspecified: Secondary | ICD-10-CM | POA: Diagnosis not present

## 2018-09-07 DIAGNOSIS — D631 Anemia in chronic kidney disease: Secondary | ICD-10-CM | POA: Insufficient documentation

## 2018-09-07 DIAGNOSIS — N186 End stage renal disease: Secondary | ICD-10-CM | POA: Diagnosis not present

## 2018-09-07 DIAGNOSIS — Z951 Presence of aortocoronary bypass graft: Secondary | ICD-10-CM | POA: Diagnosis not present

## 2018-09-07 DIAGNOSIS — K219 Gastro-esophageal reflux disease without esophagitis: Secondary | ICD-10-CM | POA: Insufficient documentation

## 2018-09-07 DIAGNOSIS — E118 Type 2 diabetes mellitus with unspecified complications: Secondary | ICD-10-CM | POA: Diagnosis present

## 2018-09-07 DIAGNOSIS — Z794 Long term (current) use of insulin: Secondary | ICD-10-CM | POA: Insufficient documentation

## 2018-09-07 DIAGNOSIS — I25110 Atherosclerotic heart disease of native coronary artery with unstable angina pectoris: Secondary | ICD-10-CM | POA: Diagnosis not present

## 2018-09-07 DIAGNOSIS — Z89512 Acquired absence of left leg below knee: Secondary | ICD-10-CM | POA: Diagnosis not present

## 2018-09-07 DIAGNOSIS — I5042 Chronic combined systolic (congestive) and diastolic (congestive) heart failure: Secondary | ICD-10-CM | POA: Insufficient documentation

## 2018-09-07 DIAGNOSIS — I2510 Atherosclerotic heart disease of native coronary artery without angina pectoris: Secondary | ICD-10-CM | POA: Diagnosis not present

## 2018-09-07 DIAGNOSIS — Z89511 Acquired absence of right leg below knee: Secondary | ICD-10-CM

## 2018-09-07 DIAGNOSIS — I700 Atherosclerosis of aorta: Secondary | ICD-10-CM | POA: Insufficient documentation

## 2018-09-07 DIAGNOSIS — Z8249 Family history of ischemic heart disease and other diseases of the circulatory system: Secondary | ICD-10-CM | POA: Insufficient documentation

## 2018-09-07 DIAGNOSIS — E1051 Type 1 diabetes mellitus with diabetic peripheral angiopathy without gangrene: Secondary | ICD-10-CM | POA: Diagnosis not present

## 2018-09-07 DIAGNOSIS — F17210 Nicotine dependence, cigarettes, uncomplicated: Secondary | ICD-10-CM | POA: Diagnosis not present

## 2018-09-07 DIAGNOSIS — E782 Mixed hyperlipidemia: Secondary | ICD-10-CM

## 2018-09-07 DIAGNOSIS — E1029 Type 1 diabetes mellitus with other diabetic kidney complication: Secondary | ICD-10-CM | POA: Diagnosis present

## 2018-09-07 HISTORY — DX: Acquired absence of left leg below knee: Z89.512

## 2018-09-07 LAB — BASIC METABOLIC PANEL
Anion gap: 19 — ABNORMAL HIGH (ref 5–15)
BUN: 34 mg/dL — AB (ref 6–20)
CO2: 23 mmol/L (ref 22–32)
Calcium: 8.4 mg/dL — ABNORMAL LOW (ref 8.9–10.3)
Chloride: 92 mmol/L — ABNORMAL LOW (ref 98–111)
Creatinine, Ser: 9.34 mg/dL — ABNORMAL HIGH (ref 0.61–1.24)
GFR calc Af Amer: 7 mL/min — ABNORMAL LOW (ref >60)
GFR calc non Af Amer: 6 mL/min — ABNORMAL LOW (ref >60)
Glucose, Bld: 296 mg/dL — ABNORMAL HIGH (ref 70–99)
Potassium: 3.4 mmol/L — ABNORMAL LOW (ref 3.5–5.1)
Sodium: 134 mmol/L — ABNORMAL LOW (ref 135–145)

## 2018-09-07 LAB — CBC WITH DIFFERENTIAL/PLATELET
Abs Immature Granulocytes: 0.03 10*3/uL (ref 0.00–0.07)
Basophils Absolute: 0 10*3/uL (ref 0.0–0.1)
Basophils Relative: 0 %
Eosinophils Absolute: 0.1 10*3/uL (ref 0.0–0.5)
Eosinophils Relative: 1 %
HEMATOCRIT: 41.1 % (ref 39.0–52.0)
Hemoglobin: 12.4 g/dL — ABNORMAL LOW (ref 13.0–17.0)
Immature Granulocytes: 0 %
Lymphocytes Relative: 11 %
Lymphs Abs: 1 10*3/uL (ref 0.7–4.0)
MCH: 28.5 pg (ref 26.0–34.0)
MCHC: 30.2 g/dL (ref 30.0–36.0)
MCV: 94.5 fL (ref 80.0–100.0)
Monocytes Absolute: 0.7 10*3/uL (ref 0.1–1.0)
Monocytes Relative: 8 %
Neutro Abs: 7.2 10*3/uL (ref 1.7–7.7)
Neutrophils Relative %: 80 %
Platelets: 362 10*3/uL (ref 150–400)
RBC: 4.35 MIL/uL (ref 4.22–5.81)
RDW: 17.8 % — AB (ref 11.5–15.5)
WBC: 9 10*3/uL (ref 4.0–10.5)
nRBC: 0 % (ref 0.0–0.2)

## 2018-09-07 LAB — I-STAT TROPONIN, ED
Troponin i, poc: 0.02 ng/mL (ref 0.00–0.08)
Troponin i, poc: 0.17 ng/mL (ref 0.00–0.08)

## 2018-09-07 LAB — MRSA PCR SCREENING: MRSA by PCR: NEGATIVE

## 2018-09-07 LAB — GLUCOSE, CAPILLARY: Glucose-Capillary: 190 mg/dL — ABNORMAL HIGH (ref 70–99)

## 2018-09-07 LAB — TROPONIN I: Troponin I: 0.42 ng/mL (ref <0.03)

## 2018-09-07 MED ORDER — INSULIN ASPART 100 UNIT/ML ~~LOC~~ SOLN: 0.0000 [IU] | Freq: Three times a day (TID) | SUBCUTANEOUS | Status: DC

## 2018-09-07 MED ORDER — DOCUSATE SODIUM 100 MG PO CAPS: 100.0000 mg | ORAL_CAPSULE | Freq: Every day | ORAL | Status: DC

## 2018-09-07 MED ORDER — RENA-VITE PO TABS
1.0000 | ORAL_TABLET | Freq: Every day | ORAL | Status: DC
  Administered 2018-09-07: 1 via ORAL
  Filled 2018-09-07: qty 1

## 2018-09-07 MED ORDER — ASPIRIN EC 81 MG PO TBEC
81.0000 mg | DELAYED_RELEASE_TABLET | Freq: Every day | ORAL | Status: DC
  Administered 2018-09-07: 81 mg via ORAL
  Filled 2018-09-07: qty 1

## 2018-09-07 MED ORDER — VITAMIN D 25 MCG (1000 UNIT) PO TABS
1000.0000 [IU] | ORAL_TABLET | Freq: Every day | ORAL | Status: DC
  Administered 2018-09-07: 1000 [IU] via ORAL
  Filled 2018-09-07: qty 1

## 2018-09-07 MED ORDER — PRO-STAT SUGAR FREE PO LIQD
30.0000 mL | Freq: Two times a day (BID) | ORAL | Status: DC
  Administered 2018-09-07: 30 mL via ORAL
  Filled 2018-09-07: qty 30

## 2018-09-07 MED ORDER — CLOPIDOGREL BISULFATE 75 MG PO TABS
75.0000 mg | ORAL_TABLET | Freq: Every day | ORAL | Status: DC
  Administered 2018-09-07: 75 mg via ORAL
  Filled 2018-09-07: qty 1

## 2018-09-07 MED ORDER — CALCITRIOL 0.5 MCG PO CAPS: 1.2500 ug | ORAL_CAPSULE | ORAL | Status: DC

## 2018-09-07 MED ORDER — ATORVASTATIN CALCIUM 80 MG PO TABS
80.0000 mg | ORAL_TABLET | Freq: Every day | ORAL | Status: DC
  Administered 2018-09-08: 80 mg via ORAL
  Filled 2018-09-07: qty 1

## 2018-09-07 MED ORDER — INSULIN GLARGINE 100 UNIT/ML ~~LOC~~ SOLN: 14.0000 [IU] | Freq: Every day | SUBCUTANEOUS | Status: DC

## 2018-09-07 MED ORDER — HEPARIN BOLUS VIA INFUSION
4000.0000 [IU] | Freq: Once | INTRAVENOUS | Status: AC
  Administered 2018-09-07: 4000 [IU] via INTRAVENOUS
  Filled 2018-09-07: qty 4000

## 2018-09-07 MED ORDER — ACETAMINOPHEN 325 MG PO TABS
650.0000 mg | ORAL_TABLET | ORAL | Status: DC | PRN
  Administered 2018-09-08 (×2): 650 mg via ORAL
  Filled 2018-09-07 (×2): qty 2

## 2018-09-07 MED ORDER — LAMOTRIGINE 150 MG PO TABS
150.0000 mg | ORAL_TABLET | Freq: Every day | ORAL | Status: DC
  Administered 2018-09-07: 150 mg via ORAL
  Filled 2018-09-07 (×2): qty 1

## 2018-09-07 MED ORDER — SERTRALINE HCL 50 MG PO TABS
50.0000 mg | ORAL_TABLET | Freq: Every day | ORAL | Status: DC
  Administered 2018-09-07: 50 mg via ORAL
  Filled 2018-09-07: qty 1

## 2018-09-07 MED ORDER — CARVEDILOL 3.125 MG PO TABS
3.1250 mg | ORAL_TABLET | Freq: Every day | ORAL | Status: DC
  Administered 2018-09-07: 3.125 mg via ORAL
  Filled 2018-09-07: qty 1

## 2018-09-07 MED ORDER — ONDANSETRON HCL 4 MG/2ML IJ SOLN: 4.0000 mg | Freq: Four times a day (QID) | INTRAMUSCULAR | Status: DC | PRN

## 2018-09-07 MED ORDER — INSULIN GLARGINE 100 UNIT/ML ~~LOC~~ SOLN
10.0000 [IU] | Freq: Every day | SUBCUTANEOUS | Status: DC
  Administered 2018-09-07: 10 [IU] via SUBCUTANEOUS
  Filled 2018-09-07 (×2): qty 0.1

## 2018-09-07 MED ORDER — LANTHANUM CARBONATE 500 MG PO CHEW
2000.0000 mg | CHEWABLE_TABLET | Freq: Three times a day (TID) | ORAL | Status: DC
  Filled 2018-09-07: qty 4

## 2018-09-07 MED ORDER — HEPARIN (PORCINE) 25000 UT/250ML-% IV SOLN
1550.0000 [IU]/h | INTRAVENOUS | Status: DC
  Administered 2018-09-07: 950 [IU]/h via INTRAVENOUS
  Filled 2018-09-07 (×2): qty 250

## 2018-09-07 NOTE — Progress Notes (Signed)
Page to MD  [id number] [place]. pt refusing care; states he wants to leave now and head to jail. will get AMA signed, please call promptly if youd like to speak to him 1st
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
## 2018-09-07 NOTE — H&P (Signed)
History and Physical    [name] AST:[id number] DOB: 26-Jul-1976 DOA: 09/07/2018  PCP: Patient, [name] Per  Patient coming from: [place]  I have personally briefly reviewed patient's old medical records in [place]  Chief Complaint: CP  HPI: [name] Scheff Sr. is a 43 y.o. male with medical history significant of CAD s/p CABG, ESRD, DM1, PAD.  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    He has had diabetes since age 23.  He has been on dialysis for the last 12 years.  He had coronary bypass surgery (LIMA to LAD, SVG to PDA, SVG to OM, Dr. Roxan Hockey) in March 2019.  He has preserved left ventricular systolic function.  Today he was arrested for domestic violence.  Was on way to see magistrate after being arrested for this and developed chest pain in the parking lot of the jail.  He was subsequently brought to the emergency room.    ED Course: Since being here his chest pain has resolved with sublingual nitroglycerin.  He is currently asymptomatic.  His initial cardiac troponin was normal at 0.02 but the second troponin had increased to 0.17.  His ECG is mildly abnormal with sinus rhythm, left ventricle hypertrophy and pre-existing ST segment depression and T wave inversion leads V5-V6, similar to previous tracings.  Intravenous heparin has been initiated.  Review of Systems: As per HPI otherwise 10 point review of systems negative.   Past Medical History:  Diagnosis Date  . Anemia   . Atherosclerosis of lower extremity ([place])   . CAD (coronary artery disease)   . Chronic combined systolic and diastolic heart failure ([place])   . Complication of anesthesia   . ESRD (end stage renal disease) on dialysis [facility])    "MWF [employer]" (03/08/2017)  . GERD (gastroesophageal reflux disease)   . Heart murmur   . History of blood transfusion    "related to OR"  . Hypertension   . PONV (postoperative nausea and vomiting)   . S/P unilateral BKA (below  knee amputation), left ([place])   . Type II diabetes mellitus ([place])     Past Surgical History:  Procedure Laterality Date  . ABDOMINAL AORTOGRAM N/A 11/02/2016   Procedure: Abdominal Aortogram;  Surgeon: Waynetta Sandy, MD;  Location: [place] [facility];  Service: Cardiovascular;  Laterality: N/A;  . ABDOMINAL AORTOGRAM W/LOWER EXTREMITY Right 08/07/2018   Procedure: ABDOMINAL AORTOGRAM W/LOWER EXTREMITY;  Surgeon: [name], MD;  Location: [place] [facility];  Service: Cardiovascular;  Laterality: Right;  . AMPUTATION Left 09/27/2013   Procedure: LEFT GREAT TOE AMPUTATION;  Surgeon: [name], MD;  Location: [place];  Service: Orthopedics;  Laterality: Left;  . AMPUTATION Right 08/15/2015   Procedure: Right Great Toe Amputation;  Surgeon: [name], MD;  Location: [place];  Service: Orthopedics;  Laterality: Right;  . AMPUTATION Left 11/05/2016   Procedure: TRANSMETATARSAL AMPUTATION LEFT FOOT;  Surgeon: [name], MD;  Location: [place];  Service: Orthopedics;  Laterality: Left;  . AMPUTATION Left 03/11/2017   Procedure: LEFT BELOW KNEE AMPUTATION;  Surgeon: [name], MD;  Location: [place];  Service: Orthopedics;  Laterality: Left;  . AMPUTATION Right 03/11/2017   Procedure: RIGHT 2ND TOE AMPUTATION;  Surgeon: [name], MD;  Location: [place];  Service: Orthopedics;  Laterality: Right;  . AV FISTULA PLACEMENT  left arm  . CORONARY ARTERY BYPASS GRAFT N/A 11/04/2017   Procedure: CORONARY  ARTERY BYPASS GRAFTING (CABG) x three, using left internal mammary artery and right    leg greater saphenous vein;  Surgeon: Melrose Nakayama, MD;  Location: [place];  Service: Open Heart Surgery;  Laterality: N/A;  . FASCIOTOMY Right 08/07/2018   Procedure: FOUR COMPARTMENT FASCIOTOMY OF RIGHT LOWER LEG;  Surgeon: Rosetta Posner, MD;  Location: [place];  Service: Vascular;  Laterality: Right;  . FASCIOTOMY CLOSURE Right 08/10/2018   Procedure: FASCIOTOMY CLOSURE RIGHT LOWER  EXTREMITY;  Surgeon: [name], MD;  Location: [place];  Service: Vascular;  Laterality: Right;  . HEMATOMA EVACUATION Right 08/07/2018   Procedure: EVACUATION HEMATOMA;  Surgeon: Rosetta Posner, MD;  Location: [place];  Service: Vascular;  Laterality: Right;  . LEFT HEART CATH AND CORONARY ANGIOGRAPHY N/A 10/31/2017   Procedure: LEFT HEART CATH AND CORONARY ANGIOGRAPHY;  Surgeon: Troy Sine, MD;  Location: [place] [facility];  Service: Cardiovascular;  Laterality: N/A;  . LEFT HEART CATHETERIZATION WITH CORONARY ANGIOGRAM N/A 09/13/2014   Procedure: LEFT HEART CATHETERIZATION WITH CORONARY ANGIOGRAM;  Surgeon: Sinclair Grooms, MD;  Location: [facility];  Service: Cardiovascular;  Laterality: N/A;  . LOWER EXTREMITY ANGIOGRAPHY Bilateral 11/02/2016   Procedure: Lower Extremity Angiography;  Surgeon: Waynetta Sandy, MD;  Location: [place] [facility];  Service: Cardiovascular;  Laterality: Bilateral;  . PERIPHERAL VASCULAR ATHERECTOMY Left 11/02/2016   Procedure: Peripheral Vascular Atherectomy;  Surgeon: Waynetta Sandy, MD;  Location: [place] [facility];  Service: Cardiovascular;  Laterality: Left;  PERONEAL  . PERIPHERAL VASCULAR ATHERECTOMY Right 08/07/2018   Procedure: PERIPHERAL VASCULAR ATHERECTOMY;  Surgeon: [name], MD;  Location: [place] [facility];  Service: Cardiovascular;  Laterality: Right;  Peroneal artery  . PERIPHERAL VASCULAR BALLOON ANGIOPLASTY Right 08/07/2018   Procedure: PERIPHERAL VASCULAR BALLOON ANGIOPLASTY;  Surgeon: [name], MD;  Location: [place] [facility];  Service: Cardiovascular;  Laterality: Right;  anterior tibial artery  . STUMP REVISION Left 01/11/2017   Procedure: Revision Left Transmetatarsal Amputation;  Surgeon: [name], MD;  Location: [place];  Service: Orthopedics;  Laterality: Left;  . TEE WITHOUT CARDIOVERSION N/A 11/04/2017   Procedure: TRANSESOPHAGEAL ECHOCARDIOGRAM (TEE);  Surgeon: Melrose Nakayama, MD;  Location: [place];  Service: Open Heart Surgery;  Laterality: N/A;  . TRANSMETATARSAL AMPUTATION Right 08/10/2018   Procedure: RIGHT FIFTH TOE AMPUTATION;  Surgeon: [name], MD;  Location: [place];  Service: Vascular;  Laterality: Right;     reports that he has been smoking cigarettes. He has a 13.00 pack-year smoking history. He has never used smokeless tobacco. He reports current alcohol use. He reports current drug use. Drug: Marijuana.  Allergies  Allergen Reactions  . Coconut Oil Anaphylaxis    Can use topically, allergic to coconut foods     Family History  Problem Relation Age of Onset  . Heart failure Mother   . Hypertension Mother      Prior to Admission medications   Medication Sig Start Date End Date Taking? Authorizing Provider  Amino Acids-Protein Hydrolys (FEEDING SUPPLEMENT, PRO-STAT SUGAR FREE 64,) LIQD Take 30 mLs by mouth 2 (two) times daily. 08/17/18  Yes Kayleen Memos, DO  aspirin EC 81 MG tablet Take 1 tablet (81 mg total) by mouth daily. 02/07/18  Yes Burtis Junes, NP  atorvastatin (LIPITOR) 80 MG tablet Take 1 tablet (80 mg total) by mouth daily at 6 PM. 08/17/18  Yes Kayleen Memos, DO  calcitRIOL (  ROCALTROL) 0.25 MCG capsule Take 5 capsules (1.25 mcg total) by mouth every Monday, Wednesday, and Friday with hemodialysis. 11/09/17  Yes Conte, Tessa N, PA-C  carvedilol (COREG) 3.125 MG tablet Take 1 tablet (3.125 mg total) by mouth daily. 08/18/18  Yes Kayleen Memos, DO  cholecalciferol (VITAMIN D3) 25 MCG (1000 UT) tablet Take 1,000 Units by mouth daily.   Yes [provider]  clopidogrel (PLAVIX) 75 MG tablet Take 1 tablet (75 mg total) by mouth daily. 02/07/18  Yes Burtis Junes, NP  docusate sodium (COLACE) 100 MG capsule Take 100 mg by mouth daily.   Yes [provider]  glucagon (GLUCAGON EMERGENCY) 1 MG injection Inject 1 mg into the vein once as needed (low blood sugar and inability to eat or drink sugar). 08/16/15   Yes Short, Noah Delaine, MD  insulin glargine (LANTUS) 100 UNIT/ML injection Inject 0.07 mLs (7 Units total) into the skin at bedtime. Patient taking differently: Inject 14 Units into the skin at bedtime.  03/17/17  Yes Rai, Ripudeep K, MD  lamoTRIgine (LAMICTAL) 150 MG tablet Take 150 mg by mouth at bedtime.   Yes [provider]  lanthanum (FOSRENOL) 1000 MG chewable tablet Chew 2 tablets (2,000 mg total) by mouth 3 (three) times daily with meals. 08/16/15  Yes Short, Noah Delaine, MD  multivitamin (RENA-VIT) TABS tablet Take 1 tablet by mouth at bedtime. 08/17/18  Yes Hall, Carole N, DO  povidone-iodine 10 % swab Apply 1 application topically as needed. Patient taking differently: Apply 1 application topically as needed (for toe.).  08/29/18  Yes [name], MD  sertraline (ZOLOFT) 100 MG tablet Take 50 mg by mouth daily.   Yes [provider]  oxyCODONE-acetaminophen (PERCOCET/ROXICET) 5-325 MG tablet Take 1 tablet by mouth every 6 (six) hours as needed for moderate pain. Patient not taking: Reported on 08/29/2018 08/23/18   Elam Dutch, MD    Physical Exam: Vitals:   09/07/18 1700 09/07/18 1715 09/07/18 1800 09/07/18 1830  BP: (!) 131/33 (!) 154/35 119/63 (!) 162/64  Pulse: 73 79 72 74  Resp: 17 19 14 18   Temp:      TempSrc:      SpO2: 99% 100% 99% 94%  Weight:      Height:        Constitutional: NAD, calm, comfortable Eyes: PERRL, lids and conjunctivae normal ENMT: Mucous membranes are moist. Posterior pharynx clear of any exudate or lesions.Normal dentition.  Neck: normal, supple, no masses, no thyromegaly Respiratory: clear to auscultation bilaterally, no wheezing, no crackles. Normal respiratory effort. No accessory muscle use.  Cardiovascular: Regular rate and rhythm, no murmurs / rubs / gallops. No extremity edema. 2+ pedal pulses. No carotid bruits.  Abdomen: no tenderness, no masses palpated. No hepatosplenomegaly. Bowel sounds positive.    Musculoskeletal: no clubbing / cyanosis. No joint deformity upper and lower extremities. Good ROM, no contractures. Normal muscle tone.  Skin: no rashes, lesions, ulcers. No induration Neurologic: CN 2-12 grossly intact. Sensation intact, DTR normal. Strength 5/5 in all 4.  Psychiatric: Normal judgment and insight. Alert and oriented x 3. Normal mood.    Labs on Admission: I have personally reviewed following labs and imaging studies  CBC: Recent Labs  Lab 09/07/18 1518  WBC 9.0  NEUTROABS 7.2  HGB 12.4*  HCT 41.1  MCV 94.5  PLT 161   Basic Metabolic Panel: Recent Labs  Lab 09/07/18 1518  NA 134*  K 3.4*  CL 92*  CO2 23  GLUCOSE  296*  BUN 34*  CREATININE 9.34*  CALCIUM 8.4*   GFR: Estimated Creatinine Clearance: 11.8 mL/min (A) (by C-G formula based on SCr of 9.34 mg/dL (H)). Liver Function Tests: No results for input(s): AST, ALT, ALKPHOS, BILITOT, PROT, ALBUMIN in the last 168 hours. No results for input(s): LIPASE, AMYLASE in the last 168 hours. No results for input(s): AMMONIA in the last 168 hours. Coagulation Profile: No results for input(s): INR, PROTIME in the last 168 hours. Cardiac Enzymes: No results for input(s): CKTOTAL, CKMB, CKMBINDEX, TROPONINI in the last 168 hours. BNP (last 3 results) No results for input(s): PROBNP in the last 8760 hours. HbA1C: No results for input(s): HGBA1C in the last 72 hours. CBG: No results for input(s): GLUCAP in the last 168 hours. Lipid Profile: No results for input(s): CHOL, HDL, LDLCALC, TRIG, CHOLHDL, LDLDIRECT in the last 72 hours. Thyroid Function Tests: No results for input(s): TSH, T4TOTAL, FREET4, T3FREE, THYROIDAB in the last 72 hours. Anemia Panel: No results for input(s): VITAMINB12, FOLATE, FERRITIN, TIBC, IRON, RETICCTPCT in the last 72 hours. Urine analysis:    Component Value Date/Time   COLORURINE RED BIOCHEMICALS MAY BE AFFECTED BY COLOR (A) 01/29/2010 0939   APPEARANCEUR CLOUDY (A) 01/29/2010  0939   LABSPEC 1.020 01/29/2010 0939   PHURINE 8.0 01/29/2010 0939   GLUCOSEU NEGATIVE 01/29/2010 0939   HGBUR LARGE (A) 01/29/2010 0939   BILIRUBINUR MODERATE (A) 01/29/2010 0939   KETONESUR 15 (A) 01/29/2010 0939   PROTEINUR >300 (A) 01/29/2010 0939   UROBILINOGEN 0.2 01/29/2010 0939   NITRITE POSITIVE (A) 01/29/2010 0939   LEUKOCYTESUR LARGE (A) 01/29/2010 0939    Radiological Exams on Admission: Dg Chest Port 1 View  Result Date: 09/07/2018 CLINICAL DATA:  Shortness of breath and chest pain. End-stage renal disease EXAM: PORTABLE CHEST 1 VIEW COMPARISON:  August 04, 2018 FINDINGS: Lungs are clear. Heart size and pulmonary vascularity are normal. No adenopathy. Patient is status post coronary artery bypass grafting. There is aortic atherosclerosis. No pneumothorax. No bone lesions. IMPRESSION: No edema or consolidation. There is aortic atherosclerosis. Patient is status post coronary artery bypass grafting. Aortic Atherosclerosis (ICD10-I70.0). Electronically Signed   By: Lowella Grip III M.D.   On: 09/07/2018 15:30    EKG: Independently reviewed.  Assessment/Plan Principal Problem:   Chest pain, rule out acute myocardial infarction Active Problems:   ESRD on dialysis [facility])   Essential hypertension   S/P BKA (below knee amputation) unilateral, left ([facility])   S/P CABG x 3   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[place])    1. CP r/o - 1. CP obs pathway 2. Serial trops 3. Tele monitor 4. Cards consult in chart 5. Will let patient eat 6. 2d echo in AM 7. Heparin gtt for now 2. ESRD - 1. EDP spoke with Dr. Joelyn Oms, dialysis in AM 3. DM1 - 1. Lantus 10u QHS 2. Sensitive SSI AC 4. HTN - continue home BP meds  DVT prophylaxis: Heparin gtt Code Status: Full Family Communication: No family in room Disposition Plan: Home after admit Consults called: Cards, Nephrology Admission status: Place in obs     Ronda Kazmi, [facility] Pager [phone] Only  works nights!  If 7AM-7PM, please contact the primary day team physician taking care of patient  [email or web address] Password [facility]  09/07/2018, 8:03 PM

## 2018-09-07 NOTE — ED Notes (Signed)
Got patient undress on the monitor did ekg shown to er doctor patient is resting with call bell in reach 

## 2018-09-07 NOTE — Progress Notes (Signed)
ANTICOAGULATION CONSULT NOTE - Initial Consult  Pharmacy Consult for heparin Indication: chest pain/ACS  Allergies  Allergen Reactions  . Coconut Oil Anaphylaxis    Can use topically, allergic to coconut foods     Patient Measurements: Height: 6\' 1"  (185.4 cm) Weight: 180 lb 12.4 oz (82 kg) IBW/kg (Calculated) : 79.9  Vital Signs: Temp: 98.1 F (36.7 C) (01/23 1509) Temp Source: Oral (01/23 1509) BP: 119/63 (01/23 1800) Pulse Rate: 72 (01/23 1800)  Labs: Recent Labs    09/07/18 1518  HGB 12.4*  HCT 41.1  PLT 362  CREATININE 9.34*    Estimated Creatinine Clearance: 11.8 mL/min (A) (by C-G formula based on SCr of 9.34 mg/dL (H)).  Assessment: CC/HPI: 43 yo m presenting with CP from jail  PMH: CAD, ESRD HTN DM  Anticoag: none pta iv hep for acs  Renal: HD  Heme/Onc: H&H 12.4/41.1, Plt 362  Goal of Therapy:  Heparin level 0.3-0.7 units/ml Monitor platelets by anticoagulation protocol: Yes   Plan:  Heparin bolus 4000 units x 1  Heparin gtt 950 units/hr Initial HL 0100 Daily HL CBC F/U cards plans  Levester Fresh, PharmD, BCPS, BCCCP Clinical Pharmacist [phone]  Please check AMION for all [place] numbers  09/07/2018 6:58 PM

## 2018-09-07 NOTE — Progress Notes (Signed)
CRITICAL VALUE ALERT  Critical Value:  0.42 Troponin   Date & Time Notied:  09/07/18 10:52 PM  Provider Notified: Chakravartti  Orders Received/Actions taken: awaiting call back.

## 2018-09-07 NOTE — ED Provider Notes (Signed)
[facility] EMERGENCY DEPARTMENT Provider Note   CSN: [id number] Arrival date & time: 09/07/18  1506     History   Chief Complaint Chief Complaint  Patient presents with  . Chest Pain    HPI [name] Ezzell Sr. is a 43 y.o. male history of CAD status post CABG, ESRD (last HD was yesterday), hypertension, diabetes here presenting with chest pain.  Patient states that he had an argument with his girlfriend.  Barely the police was called out and arrested him.  When he got to jail, he had substernal chest pain and EMS was called and he was brought here.  Patient was given nitro prior to arrival and now is pain-free.  Patient states that he does have history of CABG in the past and he had last dialysis was yesterday.   The history is provided by the patient.    Past Medical History:  Diagnosis Date  . Anemia   . Atherosclerosis of lower extremity ([place])   . CAD (coronary artery disease)   . Chronic combined systolic and diastolic heart failure ([place])   . Complication of anesthesia   . ESRD (end stage renal disease) on dialysis [facility])    "MWF [employer]" (03/08/2017)  . GERD (gastroesophageal reflux disease)   . Heart murmur   . History of blood transfusion    "related to OR"  . Hypertension   . PONV (postoperative nausea and vomiting)   . Type II diabetes mellitus [facility])     Patient Active Problem List   Diagnosis Date Noted  . Gangrene of toe of left foot ([place]) 08/05/2018  . DM (diabetes mellitus), type 2 with renal complications ([place]) 16/96/7893  . Gangrene of foot ([place]) 08/05/2018  . ESRD (end stage renal disease) on dialysis ([place]) 08/04/2018  . 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 ACS (acute coronary syndrome) ([place]) 10/28/2017  . Flash pulmonary edema ([place]) 06/04/2017  . Hypertensive emergency 06/04/2017  . 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 S/P unilateral BKA (below knee amputation), left ([place])   . Post-operative pain   . Acute blood loss anemia   . Chronic combined systolic and diastolic CHF (congestive heart failure) ([place])   . PVD (peripheral vascular disease) ([place])   . Coronary artery disease involving native coronary artery of native heart with unstable angina pectoris ([place])   . Tobacco abuse   . Diabetic foot infection ([place]) 03/08/2017  . Osteomyelitis ([place]) 03/08/2017  . Wound dehiscence   . Diabetes mellitus with complication ([place])   . Dehiscence of amputation stump ([place]) 01/04/2017  . S/P transmetatarsal amputation of foot, left ([place]) 11/16/2016  . Gangrene of right foot ([place]) 08/02/2016  . Achilles tendon contracture, left 08/02/2016  . Anemia of renal disease 08/16/2015  . Wound infection 08/15/2015  . Diabetic ulcer of right great toe ([place]) 08/15/2015  . Pain in the chest   . Elevated troponin   . Essential hypertension   . ESRD on dialysis ([place]) 08/07/2013  . Diabetes ([place]) 08/07/2013  . Chest pain 05/16/2012  . Murmur 05/16/2012  . Renal disorder     Past Surgical History:  Procedure Laterality Date  . ABDOMINAL AORTOGRAM N/A 11/02/2016   Procedure: Abdominal Aortogram;  Surgeon: Waynetta Sandy, MD;  Location: [facility] CV LAB;  Service: Cardiovascular;  Laterality: N/A;  . ABDOMINAL AORTOGRAM W/LOWER  EXTREMITY Right 08/07/2018   Procedure: ABDOMINAL AORTOGRAM W/LOWER EXTREMITY;  Surgeon: [name], MD;  Location: [facility] CV LAB;  Service: Cardiovascular;  Laterality: Right;  . AMPUTATION Left 09/27/2013   Procedure: LEFT GREAT TOE AMPUTATION;  Surgeon: [name], MD;  Location: [place];  Service: Orthopedics;  Laterality: Left;  . AMPUTATION Right 08/15/2015   Procedure: Right Great Toe Amputation;  Surgeon: [name], MD;  Location: [place];  Service: Orthopedics;  Laterality: Right;  . AMPUTATION Left 11/05/2016   Procedure:  TRANSMETATARSAL AMPUTATION LEFT FOOT;  Surgeon: [name], MD;  Location: [place];  Service: Orthopedics;  Laterality: Left;  . AMPUTATION Left 03/11/2017   Procedure: LEFT BELOW KNEE AMPUTATION;  Surgeon: [name], MD;  Location: [place];  Service: Orthopedics;  Laterality: Left;  . AMPUTATION Right 03/11/2017   Procedure: RIGHT 2ND TOE AMPUTATION;  Surgeon: [name], MD;  Location: [place];  Service: Orthopedics;  Laterality: Right;  . AV FISTULA PLACEMENT  left arm  . CORONARY ARTERY BYPASS GRAFT N/A 11/04/2017   Procedure: CORONARY ARTERY BYPASS GRAFTING (CABG) x three, using left internal mammary artery and right    leg greater saphenous vein;  Surgeon: Melrose Nakayama, MD;  Location: [facility];  Service: Open Heart Surgery;  Laterality: N/A;  . FASCIOTOMY Right 08/07/2018   Procedure: FOUR COMPARTMENT FASCIOTOMY OF RIGHT LOWER LEG;  Surgeon: Rosetta Posner, MD;  Location: [place];  Service: Vascular;  Laterality: Right;  . FASCIOTOMY CLOSURE Right 08/10/2018   Procedure: FASCIOTOMY CLOSURE RIGHT LOWER EXTREMITY;  Surgeon: [name], MD;  Location: [facility];  Service: Vascular;  Laterality: Right;  . HEMATOMA EVACUATION Right 08/07/2018   Procedure: EVACUATION HEMATOMA;  Surgeon: Rosetta Posner, MD;  Location: [facility];  Service: Vascular;  Laterality: Right;  . LEFT HEART CATH AND CORONARY ANGIOGRAPHY N/A 10/31/2017   Procedure: LEFT HEART CATH AND CORONARY ANGIOGRAPHY;  Surgeon: Troy Sine, MD;  Location: [place] CV LAB;  Service: Cardiovascular;  Laterality: N/A;  . LEFT HEART CATHETERIZATION WITH CORONARY ANGIOGRAM N/A 09/13/2014   Procedure: LEFT HEART CATHETERIZATION WITH CORONARY ANGIOGRAM;  Surgeon: Sinclair Grooms, MD;  Location[facility] C[facility];  Service: Cardiovascular;  Laterality: N/A;  . LOWER EXTREMITY ANGIOGRAPHY Bilateral 11/02/2016   Procedure: Lower Extremity Angiography;  Surgeon: Waynetta Sandy, MD;  Location: [facility] CV LAB;  Service:  Cardiovascular;  Laterality: Bilateral;  . PERIPHERAL VASCULAR ATHERECTOMY Left 11/02/2016   Procedure: Peripheral Vascular Atherectomy;  Surgeon: Waynetta Sandy, MD;  Location: [place] CV LAB;  Service: Cardiovascular;  Laterality: Left;  PERONEAL  . PERIPHERAL VASCULAR ATHERECTOMY Right 08/07/2018   Procedure: PERIPHERAL VASCULAR ATHERECTOMY;  Surgeon: [name], MD;  Location: [place] CV LAB;  Service: Cardiovascular;  Laterality: Right;  Peroneal artery  . PERIPHERAL VASCULAR BALLOON ANGIOPLASTY Right 08/07/2018   Procedure: PERIPHERAL VASCULAR BALLOON ANGIOPLASTY;  Surgeon: [name], MD;  Location: [place] CV LAB;  Service: Cardiovascular;  Laterality: Right;  anterior tibial artery  . STUMP REVISION Left 01/11/2017   Procedure: Revision Left Transmetatarsal Amputation;  Surgeon: [name], MD;  Location: [place];  Service: Orthopedics;  Laterality: Left;  . TEE WITHOUT CARDIOVERSION N/A 11/04/2017   Procedure: TRANSESOPHAGEAL ECHOCARDIOGRAM (TEE);  Surgeon: Melrose Nakayama, MD;  Location: [facility];  Service: Open Heart Surgery;  Laterality: N/A;  . TRANSMETATARSAL AMPUTATION Right 08/10/2018   Procedure: RIGHT FIFTH TOE AMPUTATION;  Surgeon:  [name], MD;  Location: [facility];  Service: Vascular;  Laterality: Right;        Home Medications    Prior to Admission medications   Medication Sig Start Date End Date Taking? Authorizing Provider  Amino Acids-Protein Hydrolys (FEEDING SUPPLEMENT, PRO-STAT SUGAR FREE 64,) LIQD Take 30 mLs by mouth 2 (two) times daily. 08/17/18   Kayleen Memos, DO  aspirin EC 81 MG tablet Take 1 tablet (81 mg total) by mouth daily. 02/07/18   Burtis Junes, NP  atorvastatin (LIPITOR) 80 MG tablet Take 1 tablet (80 mg total) by mouth daily at 6 PM. 08/17/18   Kayleen Memos, DO  calcitRIOL (ROCALTROL) 0.25 MCG capsule Take 5 capsules (1.25 mcg total) by mouth every Monday, Wednesday, and Friday with  hemodialysis. 11/09/17   Elgie Collard, PA-C  carvedilol (COREG) 3.125 MG tablet Take 1 tablet (3.125 mg total) by mouth daily. 08/18/18   Kayleen Memos, DO  cholecalciferol (VITAMIN D3) 25 MCG (1000 UT) tablet Take 1,000 Units by mouth daily.    [provider]  clopidogrel (PLAVIX) 75 MG tablet Take 1 tablet (75 mg total) by mouth daily. 02/07/18   Burtis Junes, NP  Darbepoetin Alfa (ARANESP) 60 MCG/0.3ML SOSY injection Inject 0.3 mLs (60 mcg total) into the vein every Tuesday with hemodialysis. Patient taking differently: Inject 60 mcg into the vein See admin instructions. On Tuesday and Thursday with Dialysis 11/15/17   Elgie Collard, PA-C  docusate sodium (COLACE) 100 MG capsule Take 100 mg by mouth daily.    [provider]  glucagon (GLUCAGON EMERGENCY) 1 MG injection Inject 1 mg into the vein once as needed (low blood sugar and inability to eat or drink sugar). 08/16/15   [name], MD  insulin aspart (NOVOLOG) 100 UNIT/ML injection Sliding scale CBG 70 - 120: 0 units CBG 121 - 150: 1 unit,  CBG 151 - 200: 2 units,  CBG 201 - 250: 3 units,  CBG 251 - 300: 5 units,  CBG 301 - 350: 7 units,  CBG 351 - 400: 9 units   CBG > 400: 9 units and notify your MD 03/17/17   Rai, Vernelle Emerald, MD  insulin glargine (LANTUS) 100 UNIT/ML injection Inject 0.07 mLs (7 Units total) into the skin at bedtime. Patient taking differently: Inject 6 Units into the skin at bedtime.  03/17/17   Rai, Ripudeep K, MD  ipratropium-albuterol (DUONEB) 0.5-2.5 (3) MG/3ML SOLN Take 3 mLs by nebulization 3 (three) times daily as needed (for shortness of breath).     [provider]  lamoTRIgine (LAMICTAL) 150 MG tablet Take 150 mg by mouth at bedtime.    [provider]  lanthanum (FOSRENOL) 1000 MG chewable tablet Chew 2 tablets (2,000 mg total) by mouth 3 (three) times daily with meals. 08/16/15   [name], MD  multivitamin (RENA-VIT) TABS tablet Take 1 tablet by mouth at bedtime.  08/17/18   Kayleen Memos, DO  ondansetron (ZOFRAN) 4 MG tablet Take 4 mg by mouth every 6 (six) hours as needed for nausea.     [provider]  oxyCODONE-acetaminophen (PERCOCET/ROXICET) 5-325 MG tablet Take 1 tablet by mouth every 6 (six) hours as needed for moderate pain. Patient not taking: Reported on 08/29/2018 08/23/18   Elam Dutch, MD  povidone-iodine 10 % swab Apply 1 application topically as needed. 08/29/18   [name], MD  sertraline (ZOLOFT) 100 MG tablet Take 50 mg by mouth  daily.    [provider]  sulfamethoxazole-trimethoprim (BACTRIM,SEPTRA) 400-80 MG tablet Take 1 tablet by mouth daily at 8 pm. 08/17/18   Kayleen Memos, DO    Family History Family History  Problem Relation Age of Onset  . Heart failure Mother   . Hypertension Mother     Social History Social History   Tobacco Use  . Smoking status: Current Every Day Smoker    Packs/day: 0.50    Years: 26.00    Pack years: 13.00    Types: Cigarettes  . Smokeless tobacco: Never Used  Substance Use Topics  . Alcohol use: Yes    Comment: 03/08/2017 "haven't took a drink in over 5 years"  . Drug use: Yes    Types: Marijuana    Comment: 03/08/2017 "basically q day"      Allergies   Coconut oil   Review of Systems Review of Systems  Cardiovascular: Positive for chest pain.  All other systems reviewed and are negative.    Physical Exam Updated Vital Signs BP (!) 124/48 (BP Location: Right Arm)   Pulse 87   Temp 98.1 F (36.7 C) (Oral)   Resp 20   Ht 6\' 1"  (1.854 m)   Wt 82 kg   SpO2 97%   BMI 23.85 kg/m   Physical Exam Vitals signs and nursing note reviewed.  Constitutional:      Comments: Chronically ill   HENT:     Head: Normocephalic.  Eyes:     Extraocular Movements: Extraocular movements intact.     Pupils: Pupils are equal, round, and reactive to light.  Neck:     Musculoskeletal: Normal range of motion.  Cardiovascular:     Rate and Rhythm: Normal  rate and regular rhythm.     Heart sounds: Normal heart sounds.  Pulmonary:     Breath sounds: Normal breath sounds.  Abdominal:     General: Bowel sounds are normal.     Palpations: Abdomen is soft.  Musculoskeletal: Normal range of motion.     Comments: R foot amputated   Skin:    General: Skin is warm.     Capillary Refill: Capillary refill takes less than 2 seconds.  Neurological:     General: No focal deficit present.     Mental Status: He is alert.  Psychiatric:        Mood and Affect: Mood normal.        Behavior: Behavior normal.      ED Treatments / Results  Labs (all labs ordered are listed, but only abnormal results are displayed) Labs Reviewed  CBC WITH DIFFERENTIAL/PLATELET  BASIC METABOLIC PANEL  I-STAT TROPONIN, ED    EKG EKG Interpretation  Date/Time:  Thursday September 07 2018 15:10:11 EST Ventricular Rate:  88 PR Interval:    QRS Duration: 102 QT Interval:  393 QTC Calculation: 476 R Axis:   46 Text Interpretation:  Sinus rhythm Probable left atrial enlargement Borderline repolarization abnormality Borderline prolonged QT interval No significant change since last tracing Confirmed by Wandra Arthurs [phone]) on 09/07/2018 3:16:07 PM   Radiology No results found.  Procedures Procedures (including critical care time)  CRITICAL CARE Performed by: Wandra Arthurs   Total critical care time: 30 minutes  Critical care time was exclusive of separately billable procedures and treating other patients.  Critical care was necessary to treat or prevent imminent or life-threatening deterioration.  Critical care was time spent personally by me on the following activities: development of  treatment plan with patient and/or surrogate as well as nursing, discussions with consultants, evaluation of patient's response to treatment, examination of patient, obtaining history from patient or surrogate, ordering and performing treatments and interventions, ordering and  review of laboratory studies, ordering and review of radiographic studies, pulse oximetry and re-evaluation of patient's condition.   Medications Ordered in ED Medications - No data to display   Initial Impression / Assessment and Plan / ED Course  I have reviewed the triage vital signs and the nursing notes.  Pertinent labs & imaging results that were available during my care of the patient were reviewed by me and considered in my medical decision making (see chart for details).    Bader Stubblefield Sr. is a 43 y.o. male  Here with chest pain after being arrested. No obvious EKG changes. He does have hx of CABG. Will get labs, trop x 2, CXR. Will reassess.   7:27 PM Patient's trop trended positive to 0.17. I talked to Dr. Acie Fredrickson from cardiology. He states that patient is a dialysis patient and may have positive trop. I started patient on heparin. Cardiology to see patient. Dr. Joelyn Oms from nephrology who will dialyze tomorrow. Hospitalist to admit for unstable angina.   Final Clinical Impressions(s) / ED Diagnoses   Final diagnoses:  None    ED Discharge Orders    None       Drenda Freeze, MD 09/07/18 1929

## 2018-09-07 NOTE — Progress Notes (Signed)
Patient has changed his mind, and decided to stay to be treated.  Page to MD to notify.

## 2018-09-07 NOTE — Progress Notes (Signed)
Pt remains in police custody, 3 officers present at doorway of patient room.   Pts CBG was 190 prior to the snack he was provided, as he denied eating all day.   Pt admission complete. Awaiting medication review/arrival at this time. Pt currently sleeping.

## 2018-09-08 ENCOUNTER — Observation Stay (HOSPITAL_BASED_OUTPATIENT_CLINIC_OR_DEPARTMENT_OTHER): Payer: Medicaid Other

## 2018-09-08 DIAGNOSIS — R079 Chest pain, unspecified: Secondary | ICD-10-CM | POA: Diagnosis not present

## 2018-09-08 DIAGNOSIS — N186 End stage renal disease: Secondary | ICD-10-CM | POA: Diagnosis not present

## 2018-09-08 DIAGNOSIS — E785 Hyperlipidemia, unspecified: Secondary | ICD-10-CM

## 2018-09-08 DIAGNOSIS — Z992 Dependence on renal dialysis: Secondary | ICD-10-CM | POA: Diagnosis not present

## 2018-09-08 DIAGNOSIS — R072 Precordial pain: Secondary | ICD-10-CM | POA: Diagnosis not present

## 2018-09-08 DIAGNOSIS — I10 Essential (primary) hypertension: Secondary | ICD-10-CM | POA: Diagnosis not present

## 2018-09-08 LAB — GLUCOSE, CAPILLARY
Glucose-Capillary: 91 mg/dL (ref 70–99)
Glucose-Capillary: 168 mg/dL — ABNORMAL HIGH (ref 70–99)
Glucose-Capillary: 95 mg/dL (ref 70–99)

## 2018-09-08 LAB — CBC
HCT: 38.8 % — ABNORMAL LOW (ref 39.0–52.0)
Hemoglobin: 11.9 g/dL — ABNORMAL LOW (ref 13.0–17.0)
MCH: 28.4 pg (ref 26.0–34.0)
MCHC: 30.7 g/dL (ref 30.0–36.0)
MCV: 92.6 fL (ref 80.0–100.0)
Platelets: 353 10*3/uL (ref 150–400)
RBC: 4.19 MIL/uL — ABNORMAL LOW (ref 4.22–5.81)
RDW: 17.5 % — ABNORMAL HIGH (ref 11.5–15.5)
WBC: 8.2 10*3/uL (ref 4.0–10.5)
nRBC: 0 % (ref 0.0–0.2)

## 2018-09-08 LAB — ECHOCARDIOGRAM COMPLETE
Height: 73 in
Weight: 2885.38 oz

## 2018-09-08 LAB — HEPARIN LEVEL (UNFRACTIONATED)
Heparin Unfractionated: 0.1 IU/mL — ABNORMAL LOW (ref 0.30–0.70)
Heparin Unfractionated: 0.18 IU/mL — ABNORMAL LOW (ref 0.30–0.70)

## 2018-09-08 LAB — TROPONIN I: Troponin I: 0.41 ng/mL (ref <0.03)

## 2018-09-08 MED ORDER — ISOSORBIDE MONONITRATE ER 30 MG PO TB24
30.0000 mg | ORAL_TABLET | Freq: Every day | ORAL | Status: DC
  Administered 2018-09-08: 30 mg via ORAL
  Filled 2018-09-08: qty 1

## 2018-09-08 MED ORDER — ISOSORBIDE MONONITRATE ER 30 MG PO TB24: 30.0000 mg | ORAL_TABLET | Freq: Every day | ORAL | 0 refills | Status: DC

## 2018-09-08 MED ORDER — INSULIN GLARGINE 100 UNIT/ML ~~LOC~~ SOLN: 10.0000 [IU] | Freq: Every day | SUBCUTANEOUS | 11 refills | Status: DC

## 2018-09-08 MED ORDER — HEPARIN BOLUS VIA INFUSION
2500.0000 [IU] | Freq: Once | INTRAVENOUS | Status: AC
  Administered 2018-09-08: 2500 [IU] via INTRAVENOUS
  Filled 2018-09-08: qty 2500

## 2018-09-08 MED ORDER — CHLORHEXIDINE GLUCONATE CLOTH 2 % EX PADS: 6.0000 | MEDICATED_PAD | Freq: Every day | CUTANEOUS | Status: DC

## 2018-09-08 MED ORDER — ACETAMINOPHEN 325 MG PO TABS: 650.0000 mg | ORAL_TABLET | ORAL | Status: AC | PRN

## 2018-09-08 NOTE — Discharge Instructions (Signed)
The echocardiogram of your heart is similar to your prior-- heart is pumping well and no issues with wall motion.  A medication called Imdur has been added to your regimen.  Also, continue to control your risk factors- diabetes, smoking -continue HD

## 2018-09-08 NOTE — Progress Notes (Signed)
  Echocardiogram 2D Echocardiogram has been performed.  Jennette Dubin 09/08/2018, 9:47 AM

## 2018-09-08 NOTE — Progress Notes (Signed)
Pt refused 5 am labs (CBC) his Heparin level is due at 10 am.

## 2018-09-08 NOTE — Progress Notes (Signed)
ANTICOAGULATION CONSULT NOTE - Follow-up Consult  Pharmacy Consult for heparin Indication: chest pain/ACS  Allergies  Allergen Reactions  . Coconut Oil Anaphylaxis    Can use topically, allergic to coconut foods     Patient Measurements: Height: 6\' 1"  (185.4 cm) Weight: 180 lb 5.4 oz (81.8 kg) IBW/kg (Calculated) : 79.9  Vital Signs: Temp: 98.5 F (36.9 C) (01/24 0700) Temp Source: Oral (01/24 0700) BP: 146/54 (01/24 0700) Pulse Rate: 69 (01/24 0700)  Labs: Recent Labs    09/07/18 1518 09/07/18 2153 09/08/18 0048  HGB 12.4*  --  11.9*  HCT 41.1  --  38.8*  PLT 362  --  353  HEPARINUNFRC  --   --  0.10*  CREATININE 9.34*  --   --   TROPONINI  --  0.42* 0.41*    Estimated Creatinine Clearance: 11.8 mL/min (A) (by C-G formula based on SCr of 9.34 mg/dL (H)).  Assessment: 43 y.o. M on heparin for r/o ACS.   Heparin level this morning remains SUBtherapeutic despite a rate increase earlier today (HL 0.18 << 0.1, goal of 0.3-0.7). Hgb/Hct slight drop, plts wnl - no s/sx of bleeding noted at this time.   Goal of Therapy:  Heparin level 0.3-0.7 units/ml Monitor platelets by anticoagulation protocol: Yes   Plan:  - Heparin 2500 unit bolus x 1 - Increase Heparin drip rate to 1550 units/hr (15.5 ml/hr) - Will continue to monitor for any signs/symptoms of bleeding and will follow up with heparin level in 8 hours   Thank you for allowing pharmacy to be a part of this patient's care.  Alycia Rossetti, PharmD, BCPS Clinical Pharmacist Clinical phone for 09/08/2018: [phone] 09/08/2018 8:01 AM   **Pharmacist phone directory can now be found on [email or web address] ([email or web address]).  Listed under [place].

## 2018-09-08 NOTE — Progress Notes (Signed)
Pt discharge education completed and report given off to transporting police officer. Pt IV and telemetry removed. [facility] discharge back to prison with police officers to transport him to facility. Police officers handed pt discharge packet including his prescription for Imdur. Delia Heady RN

## 2018-09-08 NOTE — Progress Notes (Signed)
ANTICOAGULATION CONSULT NOTE - Follow-up Consult  Pharmacy Consult for heparin Indication: chest pain/ACS  Allergies  Allergen Reactions  . Coconut Oil Anaphylaxis    Can use topically, allergic to coconut foods     Patient Measurements: Height: 6\' 1"  (185.4 cm) Weight: 180 lb 1.9 oz (81.7 kg) IBW/kg (Calculated) : 79.9  Vital Signs: Temp: 98.4 F (36.9 C) (01/23 2046) Temp Source: Oral (01/23 2046) BP: 133/88 (01/23 2046) Pulse Rate: 73 (01/23 2046)  Labs: Recent Labs    09/07/18 1518 09/07/18 2153 09/08/18 0048  HGB 12.4*  --   --   HCT 41.1  --   --   PLT 362  --   --   HEPARINUNFRC  --   --  0.10*  CREATININE 9.34*  --   --   TROPONINI  --  0.42*  --     Estimated Creatinine Clearance: 11.8 mL/min (A) (by C-G formula based on SCr of 9.34 mg/dL (H)).  Assessment: 43 y.o. M on heparin for r/o ACS. Heparin level subtherapeutic (0.1) on gtt at 950 units/hr. No issues with line or bleeding reported per RN.  Goal of Therapy:  Heparin level 0.3-0.7 units/ml Monitor platelets by anticoagulation protocol: Yes   Plan:  Rebolus heparin 2500 units Increase heparin gtt to 1250 units/hr Will f/u 8hr heparin level  Sherlon Handing, PharmD, BCPS Clinical pharmacist  **Pharmacist phone directory can now be found on [email or web address] ([email or web address]).  Listed under [place]. 09/08/2018 1:19 AM

## 2018-09-08 NOTE — Discharge Summary (Addendum)
Physician Discharge Summary  Ritchie Klee [place] Sr. YPP:[id number] DOB: [birth year] DOA: 09/07/2018  PCP: Patient, [name] Per  Admit date: 09/07/2018 Discharge date: 09/08/2018  Admitted From: jail Discharge disposition: jail   Recommendations for Outpatient Follow-Up:   Continued risk factor modification- smoking cessation, DM control New med: imdur Continue HD  Discharge Diagnosis:   Principal Problem:   Chest pain, rule out acute myocardial infarction Active Problems:   ESRD on dialysis [facility])   Essential hypertension   S/P BKA (below knee amputation) unilateral, left (HCC)   S/P CABG x 3   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[place])    Discharge Condition: Improved.  Diet recommendation: renal/carb mod  Wound care: None.  Code status: Full.   History of Present Illness:   Tyson [place] Sr. is a 43 y.o. male with medical history significant of CAD s/p CABG, ESRD, DM1, PAD.  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    He has had diabetes since age 37. He has been on dialysis for the last 12 years. He had coronary bypass surgery (LIMA to LAD, SVG to PDA, SVG to OM, Dr. Roxan Hockey) in March 2019. He has preserved left ventricular systolic function.  Today he was arrested for domestic violence.  Was on way to see magistrate after being arrested for this and developed chest pain in the parking lot of the jail. He was subsequently brought to the emergency room.    Hospital Course by Problem:   Chest pain -probable demand ischemia -cardiology consult appreciated: Overalll LVEF is normal with no definite wall motion abnormalities   There is mitral annular calcification    I see the mobile density   This was present in March 2019 echo   May reflect annular motion or base of leaflet   I do not think endocarditis nor does he exhibit clnical findings to suggest -imdur added  ESRD -continue HD  DM -needs tight control for risk factor  modifcation  Medical Consultants:    cards  Discharge Exam:   Vitals:   09/08/18 0454 09/08/18 0700  BP: (!) 135/50 (!) 146/54  Pulse: 73 69  Resp: 19 18  Temp: 98.2 F (36.8 C) 98.5 F (36.9 C)  SpO2: 95% 97%   Vitals:   09/07/18 2015 09/07/18 2046 09/08/18 0454 09/08/18 0700  BP: (!) 127/26 133/88 (!) 135/50 (!) 146/54  Pulse:  73 73 69  Resp: 16 17 19 18   Temp:  98.4 F (36.9 C) 98.2 F (36.8 C) 98.5 F (36.9 C)  TempSrc:  Oral Oral Oral  SpO2:  99% 95% 97%  Weight:  81.7 kg 81.8 kg   Height:  6\' 1"  (1.854 m)         The results of significant diagnostics from this hospitalization (including imaging, microbiology, ancillary and laboratory) are listed below for reference.     Procedures and Diagnostic Studies:   Dg Chest Port 1 View  Result Date: 09/07/2018 CLINICAL DATA:  Shortness of breath and chest pain. End-stage renal disease EXAM: PORTABLE CHEST 1 VIEW COMPARISON:  August 04, 2018 FINDINGS: Lungs are clear. Heart size and pulmonary vascularity are normal. No adenopathy. Patient is status post coronary artery bypass grafting. There is aortic atherosclerosis. No pneumothorax. No bone lesions. IMPRESSION: No edema or consolidation. There is aortic atherosclerosis. Patient is status post coronary artery bypass grafting. Aortic Atherosclerosis (ICD10-I70.0). Electronically Signed   By: Lowella Grip III M.D.   On:  09/07/2018 15:30     Labs:   Basic Metabolic Panel: Recent Labs  Lab 09/07/18 1518  NA 134*  K 3.4*  CL 92*  CO2 23  GLUCOSE 296*  BUN 34*  CREATININE 9.34*  CALCIUM 8.4*   GFR Estimated Creatinine Clearance: 11.8 mL/min (A) (by C-G formula based on SCr of 9.34 mg/dL (H)). Liver Function Tests: No results for input(s): AST, ALT, ALKPHOS, BILITOT, PROT, ALBUMIN in the last 168 hours. No results for input(s): LIPASE, AMYLASE in the last 168 hours. No results for input(s): AMMONIA in the last 168 hours. Coagulation profile No  results for input(s): INR, PROTIME in the last 168 hours.  CBC: Recent Labs  Lab 09/07/18 1518 09/08/18 0048  WBC 9.0 8.2  NEUTROABS 7.2  --   HGB 12.4* 11.9*  HCT 41.1 38.8*  MCV 94.5 92.6  PLT 362 353   Cardiac Enzymes: Recent Labs  Lab 09/07/18 2153 09/08/18 0048  TROPONINI 0.42* 0.41*   BNP: Invalid input(s): POCBNP CBG: Recent Labs  Lab 09/07/18 2126 09/08/18 0707 09/08/18 1146  GLUCAP 190* 91 168*   D-Dimer No results for input(s): DDIMER in the last 72 hours. Hgb A1c No results for input(s): HGBA1C in the last 72 hours. Lipid Profile No results for input(s): CHOL, HDL, LDLCALC, TRIG, CHOLHDL, LDLDIRECT in the last 72 hours. Thyroid function studies No results for input(s): TSH, T4TOTAL, T3FREE, THYROIDAB in the last 72 hours.  Invalid input(s): FREET3 Anemia work up No results for input(s): VITAMINB12, FOLATE, FERRITIN, TIBC, IRON, RETICCTPCT in the last 72 hours. Microbiology Recent Results (from the past 240 hour(s))  MRSA PCR Screening     Status: None   Collection Time: 09/07/18  8:44 PM  Result Value Ref Range Status   MRSA by PCR NEGATIVE NEGATIVE Final    Comment:        The GeneXpert MRSA Assay (FDA approved for NASAL specimens only), is one component of a comprehensive MRSA colonization surveillance program. It is not intended to diagnose MRSA infection nor to guide or monitor treatment for MRSA infections. Performed at [facility], [place] [address], [place], [place] [address]      Discharge Instructions:   Discharge Instructions    Diet - low sodium heart healthy   Complete by:  As directed    Diet Carb Modified   Complete by:  As directed    Increase activity slowly   Complete by:  As directed      Allergies as of 09/08/2018      Reactions   Coconut Oil Anaphylaxis   Can use topically, allergic to coconut foods       Medication List    STOP taking these medications   oxyCODONE-acetaminophen 5-325 MG  tablet Commonly known as:  PERCOCET/ROXICET   povidone-iodine 10 % swab     TAKE these medications   acetaminophen 325 MG tablet Commonly known as:  TYLENOL Take 2 tablets (650 mg total) by mouth every 4 (four) hours as needed for headache or mild pain.   aspirin EC 81 MG tablet Take 1 tablet (81 mg total) by mouth daily.   atorvastatin 80 MG tablet Commonly known as:  LIPITOR Take 1 tablet (80 mg total) by mouth daily at 6 PM.   calcitRIOL 0.25 MCG capsule Commonly known as:  ROCALTROL Take 5 capsules (1.25 mcg total) by mouth every Monday, Wednesday, and Friday with hemodialysis.   carvedilol 3.125 MG tablet Commonly known as:  COREG Take 1 tablet (  3.125 mg total) by mouth daily.   cholecalciferol 25 MCG (1000 UT) tablet Commonly known as:  VITAMIN D3 Take 1,000 Units by mouth daily.   clopidogrel 75 MG tablet Commonly known as:  PLAVIX Take 1 tablet (75 mg total) by mouth daily.   docusate sodium 100 MG capsule Commonly known as:  COLACE Take 100 mg by mouth daily.   feeding supplement (PRO-STAT SUGAR FREE 64) Liqd Take 30 mLs by mouth 2 (two) times daily.   glucagon 1 MG injection Commonly known as:  GLUCAGON EMERGENCY Inject 1 mg into the vein once as needed (low blood sugar and inability to eat or drink sugar).   insulin glargine 100 UNIT/ML injection Commonly known as:  LANTUS Inject 0.1 mLs (10 Units total) into the skin at bedtime. What changed:  how much to take   isosorbide mononitrate 30 MG 24 hr tablet Commonly known as:  IMDUR Take 1 tablet (30 mg total) by mouth daily.   lamoTRIgine 150 MG tablet Commonly known as:  LAMICTAL Take 150 mg by mouth at bedtime.   lanthanum 1000 MG chewable tablet Commonly known as:  FOSRENOL Chew 2 tablets (2,000 mg total) by mouth 3 (three) times daily with meals.   multivitamin Tabs tablet Take 1 tablet by mouth at bedtime.   sertraline 100 MG tablet Commonly known as:  ZOLOFT Take 50 mg by mouth  daily.      Follow-up Information    [facility] Follow up.   Specialty:  Cardiology Why:  You have a follow-up visit scheduled for 10/12/2018 at 8:30am. Contact information: [address] [place]           Time coordinating discharge: 25 min  Signed:  Geradine Girt DO  [facility] 09/08/2018, 2:14 PM

## 2018-09-08 NOTE — Consult Note (Signed)
[place] Nurse wound consult note Patient receiving care in [facility] 5C16.  Two police officers stationed outside the patient's room. Reason for Consult: Right foot amputation wound Wound type: Surgical Measurement: 0.4 cm x 4 cm x 0.3 cm Wound bed: 100% black, dry Drainage (amount, consistency, odor) no drainage expressed, no odor, no induration.   Periwound: Normal color and texture skin  Dressing procedure/placement/frequency: Apply betadine to the right foot wound.  Allow to air dry.  If there continues to be no drainage, no dressing is needed.  If the wound develops drainage, place dry gauze to the area.  Patient states he has been applying betadine to this in [place], and the physician told him that he may have to have additional amputation surgery. The wound is dry and stable, and betadine will help maintain that and reduce microbial load. Monitor the wound area(s) for worsening of condition such as: Signs/symptoms of infection,  Increase in size,  Development of or worsening of odor, Development of pain, or increased pain at the affected locations.  Notify the medical team if any of these develop.  Thank you for the consult.  Discussed plan of care with the patient and bedside nurse.  [place] nurse will not follow at this time.  Please re-consult the [facility] team if needed.  Val Riles, RN, MSN, CWOCN, CNS-BC, pager [phone]

## 2018-09-08 NOTE — Progress Notes (Signed)
Report received from dialysis RN and pt arrived to the unit via bed. Francis Gaines Deira Shimer RN.

## 2018-09-08 NOTE — Progress Notes (Signed)
Report received from [place]; pt alert resting in bed. Pt picked up to be transported off unit to dialysis. Heparin drip verified with RN Judson Roch continue to infuse at ordered rate. Delia Heady RN

## 2018-09-08 NOTE — Consult Note (Signed)
Renal Service Consult Note [facility]  Melissa Pulido [employer] Sr. 09/08/2018 [place] Requesting Physician:  Dr Eliseo Squires   Reason for Consult:  ESRD pt w/ chest pain HPI: The patient is a 43 y.o. year-old with hx of DM2, PAD sp L BKA, HTN, combined CHF and ESRD on HD MWF presented to ED w/ chest pain for admission.  He had CABG last year.  Seen by cardiology, suspect demand ischemia possibly related to surgically untreated branch vessels.  Feeling better today.  ECHO pending. Asked to see for dialysis.   Pt w/o complaints, no recent HD or access issues. Good compliance w/ HD.    ROS  denies CP  no joint pain   no HA  no blurry vision  no rash  no diarrhea  no nausea/ vomiting    Past Medical History  Past Medical History:  Diagnosis Date  . Anemia   . Atherosclerosis of lower extremity ([place])   . CAD (coronary artery disease)   . Chronic combined systolic and diastolic heart failure ([place])   . Complication of anesthesia   . ESRD (end stage renal disease) on dialysis [facility])    "MWF [employer]" (03/08/2017)  . GERD (gastroesophageal reflux disease)   . Heart murmur   . History of blood transfusion    "related to OR"  . Hypertension   . PONV (postoperative nausea and vomiting)   . S/P unilateral BKA (below knee amputation), left ([place])   . Type II diabetes mellitus ([place])    Past Surgical History  Past Surgical History:  Procedure Laterality Date  . ABDOMINAL AORTOGRAM N/A 11/02/2016   Procedure: Abdominal Aortogram;  Surgeon: Waynetta Sandy, MD;  Location: [place] CV LAB;  Service: Cardiovascular;  Laterality: N/A;  . ABDOMINAL AORTOGRAM W/LOWER EXTREMITY Right 08/07/2018   Procedure: ABDOMINAL AORTOGRAM W/LOWER EXTREMITY;  Surgeon: [name], MD;  Location: [place] CV LAB;  Service: Cardiovascular;  Laterality: Right;  . AMPUTATION Left 09/27/2013   Procedure: LEFT GREAT TOE AMPUTATION;  Surgeon: [name], MD;  Location:  [place];  Service: Orthopedics;  Laterality: Left;  . AMPUTATION Right 08/15/2015   Procedure: Right Great Toe Amputation;  Surgeon: [name], MD;  Location: [facility];  Service: Orthopedics;  Laterality: Right;  . AMPUTATION Left 11/05/2016   Procedure: TRANSMETATARSAL AMPUTATION LEFT FOOT;  Surgeon: [name], MD;  Location: [place];  Service: Orthopedics;  Laterality: Left;  . AMPUTATION Left 03/11/2017   Procedure: LEFT BELOW KNEE AMPUTATION;  Surgeon: [name], MD;  Location: [place];  Service: Orthopedics;  Laterality: Left;  . AMPUTATION Right 03/11/2017   Procedure: RIGHT 2ND TOE AMPUTATION;  Surgeon: [name], MD;  Location: [place];  Service: Orthopedics;  Laterality: Right;  . AV FISTULA PLACEMENT  left arm  . CORONARY ARTERY BYPASS GRAFT N/A 11/04/2017   Procedure: CORONARY ARTERY BYPASS GRAFTING (CABG) x three, using left internal mammary artery and right    leg greater saphenous vein;  Surgeon: Melrose Nakayama, MD;  Location: [place];  Service: Open Heart Surgery;  Laterality: N/A;  . FASCIOTOMY Right 08/07/2018   Procedure: FOUR COMPARTMENT FASCIOTOMY OF RIGHT LOWER LEG;  Surgeon: Rosetta Posner, MD;  Location: [place];  Service: Vascular;  Laterality: Right;  . FASCIOTOMY CLOSURE Right 08/10/2018   Procedure: FASCIOTOMY CLOSURE RIGHT LOWER EXTREMITY;  Surgeon: [name], MD;  Location: [place];  Service: Vascular;  Laterality: Right;  . HEMATOMA EVACUATION  Right 08/07/2018   Procedure: EVACUATION HEMATOMA;  Surgeon: Rosetta Posner, MD;  Location: [place];  Service: Vascular;  Laterality: Right;  . LEFT HEART CATH AND CORONARY ANGIOGRAPHY N/A 10/31/2017   Procedure: LEFT HEART CATH AND CORONARY ANGIOGRAPHY;  Surgeon: Troy Sine, MD;  Location: [place] CV LAB;  Service: Cardiovascular;  Laterality: N/A;  . LEFT HEART CATHETERIZATION WITH CORONARY ANGIOGRAM N/A 09/13/2014   Procedure: LEFT HEART CATHETERIZATION WITH CORONARY ANGIOGRAM;  Surgeon: Sinclair Grooms,  MD;  Location: [facility] CATH LAB;  Service: Cardiovascular;  Laterality: N/A;  . LOWER EXTREMITY ANGIOGRAPHY Bilateral 11/02/2016   Procedure: Lower Extremity Angiography;  Surgeon: Waynetta Sandy, MD;  Location: [place] CV LAB;  Service: Cardiovascular;  Laterality: Bilateral;  . PERIPHERAL VASCULAR ATHERECTOMY Left 11/02/2016   Procedure: Peripheral Vascular Atherectomy;  Surgeon: Waynetta Sandy, MD;  Location: [place] CV LAB;  Service: Cardiovascular;  Laterality: Left;  PERONEAL  . PERIPHERAL VASCULAR ATHERECTOMY Right 08/07/2018   Procedure: PERIPHERAL VASCULAR ATHERECTOMY;  Surgeon: [name], MD;  Location: [place] CV LAB;  Service: Cardiovascular;  Laterality: Right;  Peroneal artery  . PERIPHERAL VASCULAR BALLOON ANGIOPLASTY Right 08/07/2018   Procedure: PERIPHERAL VASCULAR BALLOON ANGIOPLASTY;  Surgeon: [name], MD;  Location: [place] CV LAB;  Service: Cardiovascular;  Laterality: Right;  anterior tibial artery  . STUMP REVISION Left 01/11/2017   Procedure: Revision Left Transmetatarsal Amputation;  Surgeon: [name], MD;  Location: [place];  Service: Orthopedics;  Laterality: Left;  . TEE WITHOUT CARDIOVERSION N/A 11/04/2017   Procedure: TRANSESOPHAGEAL ECHOCARDIOGRAM (TEE);  Surgeon: Melrose Nakayama, MD;  Location: [place];  Service: Open Heart Surgery;  Laterality: N/A;  . TRANSMETATARSAL AMPUTATION Right 08/10/2018   Procedure: RIGHT FIFTH TOE AMPUTATION;  Surgeon: [name], MD;  Location: [facility] OR;  Service: Vascular;  Laterality: Right;   Family History  Family History  Problem Relation Age of Onset  . Heart failure Mother   . Hypertension Mother    Social History  reports that he has been smoking cigarettes. He has a 13.00 pack-year smoking history. He has never used smokeless tobacco. He reports current alcohol use. He reports current drug use. Drug: Marijuana. Allergies  Allergies  Allergen Reactions  .  Coconut Oil Anaphylaxis    Can use topically, allergic to coconut foods    Home medications Prior to Admission medications   Medication Sig Start Date End Date Taking? Authorizing Provider  Amino Acids-Protein Hydrolys (FEEDING SUPPLEMENT, PRO-STAT SUGAR FREE 64,) LIQD Take 30 mLs by mouth 2 (two) times daily. 08/17/18  Yes Kayleen Memos, DO  aspirin EC 81 MG tablet Take 1 tablet (81 mg total) by mouth daily. 02/07/18  Yes Burtis Junes, NP  atorvastatin (LIPITOR) 80 MG tablet Take 1 tablet (80 mg total) by mouth daily at 6 PM. 08/17/18  Yes Kayleen Memos, DO  calcitRIOL (ROCALTROL) 0.25 MCG capsule Take 5 capsules (1.25 mcg total) by mouth every Monday, Wednesday, and Friday with hemodialysis. 11/09/17  Yes Conte, Tessa N, PA-C  carvedilol (COREG) 3.125 MG tablet Take 1 tablet (3.125 mg total) by mouth daily. 08/18/18  Yes Kayleen Memos, DO  cholecalciferol (VITAMIN D3) 25 MCG (1000 UT) tablet Take 1,000 Units by mouth daily.   Yes [provider]  clopidogrel (PLAVIX) 75 MG tablet Take 1 tablet (75 mg total) by mouth daily. 02/07/18  Yes Burtis Junes, NP  docusate sodium (COLACE) 100 MG capsule Take  100 mg by mouth daily.   Yes [provider]  glucagon (GLUCAGON EMERGENCY) 1 MG injection Inject 1 mg into the vein once as needed (low blood sugar and inability to eat or drink sugar). 08/16/15  Yes Short, Noah Delaine, MD  insulin glargine (LANTUS) 100 UNIT/ML injection Inject 0.07 mLs (7 Units total) into the skin at bedtime. Patient taking differently: Inject 14 Units into the skin at bedtime.  03/17/17  Yes Rai, Ripudeep K, MD  lamoTRIgine (LAMICTAL) 150 MG tablet Take 150 mg by mouth at bedtime.   Yes [provider]  lanthanum (FOSRENOL) 1000 MG chewable tablet Chew 2 tablets (2,000 mg total) by mouth 3 (three) times daily with meals. 08/16/15  Yes Short, Noah Delaine, MD  multivitamin (RENA-VIT) TABS tablet Take 1 tablet by mouth at bedtime. 08/17/18  Yes Hall, Carole N, DO   povidone-iodine 10 % swab Apply 1 application topically as needed. Patient taking differently: Apply 1 application topically as needed (for toe.).  08/29/18  Yes [name], MD  sertraline (ZOLOFT) 100 MG tablet Take 50 mg by mouth daily.   Yes [provider]  oxyCODONE-acetaminophen (PERCOCET/ROXICET) 5-325 MG tablet Take 1 tablet by mouth every 6 (six) hours as needed for moderate pain. Patient not taking: Reported on 08/29/2018 08/23/18   Elam Dutch, MD   Liver Function Tests No results for input(s): AST, ALT, ALKPHOS, BILITOT, PROT, ALBUMIN in the last 168 hours. No results for input(s): LIPASE, AMYLASE in the last 168 hours. CBC Recent Labs  Lab 09/07/18 1518 09/08/18 0048  WBC 9.0 8.2  NEUTROABS 7.2  --   HGB 12.4* 11.9*  HCT 41.1 38.8*  MCV 94.5 92.6  PLT 362 540   Basic Metabolic Panel Recent Labs  Lab 09/07/18 1518  NA 134*  K 3.4*  CL 92*  CO2 23  GLUCOSE 296*  BUN 34*  CREATININE 9.34*  CALCIUM 8.4*   Iron/TIBC/Ferritin/ %Sat    Component Value Date/Time   IRON 27 (L) 03/16/2017 1142   TIBC 188 (L) 03/16/2017 1142   FERRITIN 164 03/16/2017 1142   IRONPCTSAT 14 (L) 03/16/2017 1142    Vitals:   09/07/18 2015 09/07/18 2046 09/08/18 0454 09/08/18 0700  BP: (!) 127/26 133/88 (!) 135/50 (!) 146/54  Pulse:  73 73 69  Resp: 16 17 19 18   Temp:  98.4 F (36.9 C) 98.2 F (36.8 C) 98.5 F (36.9 C)  TempSrc:  Oral Oral Oral  SpO2:  99% 95% 97%  Weight:  81.7 kg 81.8 kg   Height:  6\' 1"  (1.854 m)     Exam Gen alert , WD WN, no distress No rash, cyanosis or gangrene Sclera anicteric, throat clear   No jvd or bruits Chest clear bilat to bases RRR no MRG Abd soft ntnd no mass or ascites +bs GU normal male MS no joint effusions, L BKA well healed, R foot toe amps Ext  No LE or UE edema, no wounds or ulcers  Neuro is alert, Ox 3 , nf LUE AVF +bruit    Home meds:  - carvedilol 3.125 hs  - aspirin 81/ atorvastatin 80/  clopidogrel 75 qd  - insulin glargine 14 u hs  - lamotrigine 150 hs/ sertraline 50 qd  - lanthanum 2gm tid  - prn's / vitamins   Dialysis: MWF [facility]  4h  80kg   P2   2/2 bath  Heparin none  LUA AVF  - aranesp 200 ug q wed  - calcitriol 1.5  ug tiw   Assessment: 1. Chest pain/ hx CABG 2019 2. ESRD on HD MWF 3. Volume - stable on exam 4. HTN on coreg 5. DM2 on insulin 6. H/o combined CHF, compensated 7. MBD ckd 8. Anemia ckd- Hb 11    P: 1. HD today, see orders.        Kelly Splinter MD [employer] pager [phone]   09/08/2018, 11:46 AM

## 2018-09-08 NOTE — Progress Notes (Addendum)
Progress Note  Patient Name: [name] Moist Sr. Date of Encounter: 09/08/2018  Primary Cardiologist: Jenkins Rouge, MD   Subjective   No significant overnight events. Patient reports some mild chest pain this morning. He states it feels like "indigestion" and is just "irritating." No shortness of breath.  Inpatient Medications    Scheduled Meds: . aspirin EC  81 mg Oral QHS  . atorvastatin  80 mg Oral q1800  . calcitRIOL  1.25 mcg Oral Q M,W,F-HD  . carvedilol  3.125 mg Oral QHS  . cholecalciferol  1,000 Units Oral QHS  . clopidogrel  75 mg Oral QHS  . docusate sodium  100 mg Oral Daily  . feeding supplement (PRO-STAT SUGAR FREE 64)  30 mL Oral BID  . insulin aspart  0-9 Units Subcutaneous TID WC  . insulin glargine  10 Units Subcutaneous QHS  . lamoTRIgine  150 mg Oral QHS  . lanthanum  2,000 mg Oral TID WC  . multivitamin  1 tablet Oral QHS  . sertraline  50 mg Oral QHS   Continuous Infusions: . heparin 1,250 Units/hr (09/08/18 0137)   PRN Meds: acetaminophen, ondansetron (ZOFRAN) IV   Vital Signs    Vitals:   09/07/18 2000 09/07/18 2015 09/07/18 2046 09/08/18 0454  BP: 124/68 (!) 127/26 133/88 (!) 135/50  Pulse: 66  73 73  Resp: 17 16 17 19   Temp:   98.4 F (36.9 C) 98.2 F (36.8 C)  TempSrc:   Oral Oral  SpO2: 97%  99% 95%  Weight:   81.7 kg 81.8 kg  Height:   6\' 1"  (1.854 m)     Intake/Output Summary (Last 24 hours) at 09/08/2018 0725 Last data filed at 09/08/2018 0600 Gross per 24 hour  Intake 315.04 ml  Output 0 ml  Net 315.04 ml   Last 3 Weights 09/08/2018 09/07/2018 09/07/2018  Weight (lbs) 180 lb 5.4 oz 180 lb 1.9 oz 180 lb 12.4 oz  Weight (kg) 81.8 kg 81.7 kg 82 kg      Telemetry    Sinus rhythm with heart rates in the 60's to 80's. - Personally Reviewed  ECG    No new tracing today. - Personally Reviewed  Physical Exam   GEN: African-American male resting comfortably. Alert and in no acute distress.   Neck: Supple. Cardiac:  RRR. No significant murmurs, rubs, or gallops.  Respiratory: Clear to auscultation bilaterally. No wheezes, rhonchi, or rales. GI: Abdomen soft, nontender, non-distended  MS: No lower extremity of right leg. S/p left below knee amputation. Neuro:  No focal deficits. Psych: Normal affect.  Labs    Chemistry Recent Labs  Lab 09/07/18 1518  NA 134*  K 3.4*  CL 92*  CO2 23  GLUCOSE 296*  BUN 34*  CREATININE 9.34*  CALCIUM 8.4*  GFRNONAA 6*  GFRAA 7*  ANIONGAP 19*     Hematology Recent Labs  Lab 09/07/18 1518 09/08/18 0048  WBC 9.0 8.2  RBC 4.35 4.19*  HGB 12.4* 11.9*  HCT 41.1 38.8*  MCV 94.5 92.6  MCH 28.5 28.4  MCHC 30.2 30.7  RDW 17.8* 17.5*  PLT 362 353    Cardiac Enzymes Recent Labs  Lab 09/07/18 2153 09/08/18 0048  TROPONINI 0.42* 0.41*    Recent Labs  Lab 09/07/18 1524 09/07/18 1748  TROPIPOC 0.02 0.17*     BNPNo results for input(s): BNP, PROBNP in the last 168 hours.   DDimer No results for input(s): DDIMER in the last 168 hours.  Radiology    Dg Chest Port 1 View  Result Date: 09/07/2018 CLINICAL DATA:  Shortness of breath and chest pain. End-stage renal disease EXAM: PORTABLE CHEST 1 VIEW COMPARISON:  August 04, 2018 FINDINGS: Lungs are clear. Heart size and pulmonary vascularity are normal. No adenopathy. Patient is status post coronary artery bypass grafting. There is aortic atherosclerosis. No pneumothorax. No bone lesions. IMPRESSION: No edema or consolidation. There is aortic atherosclerosis. Patient is status post coronary artery bypass grafting. Aortic Atherosclerosis (ICD10-I70.0). Electronically Signed   By: Lowella Grip III M.D.   On: 09/07/2018 15:30    Cardiac Studies   Left Heart Catheterization 10/31/2017:  Ost 2nd Diag to 2nd Diag lesion is 99% stenosed.  Prox LAD to Mid LAD lesion is 99% stenosed.  Ost 2nd Mrg to 2nd Mrg lesion is 80% stenosed.  Mid Cx to Dist Cx lesion is 100% stenosed.  Ost RCA to Prox RCA  lesion is 20% stenosed.  Mid RCA lesion is 40% stenosed.  Dist RCA-1 lesion is 90% stenosed.  Dist RCA-2 lesion is 80% stenosed.  The left ventricular systolic function is normal.  LV end diastolic pressure is normal.   Severe multivessel CAD with coronary calcification involving all major coronary arteries. There is 99% subtotal occlusion of a small second diagonal branch of the LAD.  The mid LAD is very tortuous and has diffuse 95% nodular very calcified stenoses in the mid segment; the circumflex vessel has 80% marginal stenosis and is occluded after this marginal takeoff with collateralization retrograde distally; and a large dominant calcified RCA with 20% proximal 40% mid, and focal 90 and 80% stenoses after the acute margin and before the PDA takeoff.  Mild LV dysfunction with an ejection fraction of 40 to less than 45% with distal anterolateral apical hypocontractility.  LVEDP 5-7 mm.  RECOMMENDATION: With the extensive coronary calcification and multivessel CAD recommend surgical consultation for CABG revascularization.  Patient Profile   Mr. Serano is a 43 y.o. male with a history of CAD s/p CABG x3 in 10/2017, chronic combined CHF, ESRD on hemodialysis (M/W/F), hypertension, hyperlipidemia, type 2 diabetes mellitus, and PAD s/p left below knee amputation. Patient was arrested yesterday for assault on his girlfriend and developed chest pain in the parking lot of the jail so was transported to the [facility] ED for further evaluation.   Assessment & Plan    Chest Pain with Known CAD s/p CABG in 10/2017 - EKG showed no acute ischemic changes. - I-stat troponin mildly elevated at 0.17. Repeat troponin mildly elevated but flat at 0.42 >> 0.41. - Possible demand ischemia/injury related to emotional upset with probably increase in heart rate and BP. - Patient report mild chest pain this morning that feels like "indigestion." - Echo has been ordered for this morning to rule out any  new wall motion abnormalities.  - Patient currently on IV Heparin drip. - Continue dual antiplatelet therapy with Aspirin and Plavix. - Continue beta-blocker and statin. - Additional recommendations pending Echo results.  Hypertension - BP currently well controlled at 135/50. - Continue current medications  Hyperlipidemia - Most recent lipid panel from 01/2018: Cholesterol 171, Triglycerides 249, HDL 38, LDL 83. - LDL goal <70 given CAD. - Continue high-intensity statin.  Type 2 Diabetes Mellitus - Long-standing insulin use with multiple complications including PVD s/p amputations, CAD, and ESRD. - Management per primary team.  ESRD on Hemodialysis (M/W/F) - Patient will need dialysis today.  - Defer management to primary team and Nephrology.  Otherwise, per primary team.  For questions or updates, please contact [place] Please consult [email or web address] for contact info under        Signed, Darreld Mclean, PA-C  09/08/2018, 7:25 AM    Patient seen and examined   I agree with findings as noted by Virgie Dad      I have reviewed assessment by M Croituru.  Pt with signif dz by cath   Expect that he could have some demand ischemia in setting such as yesterday   Would not plan any intervention unless there is a signif rise in troponin or there were new wall motion changes    Otherwise optimize medical Rx  On exam, pt is pain free Lungs are CTA   Cardiac RRR   No S3   Abd is benign  Ext are without edema  For now could add imdur to regimen  30 mg   Will review echo. Continue other medical Rx for now.  Dorris Carnes   ADDENDUM:   09/08/18  I have reviewed echo  Overalll LVEF is normal with no definite wall motion abnormalities   There is mitral annular calcification    I see the mobile density   This was present in March 2019 echo   May reflect annular motion or base of leaflet   I do not think endocarditis nor does he exhibit clnical findings to suggest  I have added  Imdur   I would recomm trial    Will make sure he has f/u in clinic    OK to d/c from cardiac standpoint  Dorris Carnes

## 2018-09-08 NOTE — Progress Notes (Signed)
RN applied iodine to right foot per order. Wound care RN verbal order that iodine could be used.

## 2018-09-09 LAB — HEPATITIS B SURFACE ANTIGEN: Hepatitis B Surface Ag: NEGATIVE

## 2018-09-09 LAB — HEPATITIS B CORE ANTIBODY, TOTAL: Hep B Core Total Ab: NEGATIVE

## 2018-09-09 LAB — HEPATITIS B SURFACE ANTIBODY,QUALITATIVE: Hep B S Ab: REACTIVE

## 2018-09-12 ENCOUNTER — Encounter: Payer: Self-pay | Admitting: *Deleted

## 2018-09-12 ENCOUNTER — Other Ambulatory Visit: Payer: Self-pay

## 2018-09-12 ENCOUNTER — Ambulatory Visit (INDEPENDENT_AMBULATORY_CARE_PROVIDER_SITE_OTHER): Payer: Self-pay | Admitting: Vascular Surgery

## 2018-09-12 ENCOUNTER — Encounter: Payer: Self-pay | Admitting: Vascular Surgery

## 2018-09-12 ENCOUNTER — Other Ambulatory Visit: Payer: Self-pay | Admitting: *Deleted

## 2018-09-12 VITALS — BP 131/76 | HR 69 | Temp 97.5°F | Resp 20 | Ht 73.0 in | Wt 178.0 lb

## 2018-09-12 DIAGNOSIS — I739 Peripheral vascular disease, unspecified: Secondary | ICD-10-CM

## 2018-09-12 NOTE — Progress Notes (Signed)
Patient name: [name] Pella Sr. MRN: [id number] DOB: 02/12/1976 Sex: male  REASON FOR VISIT: Follow-up after right lower extremity endovascular intervention/R 5th toe amp  HPI: [name] Radabaugh Sr. is a 43 y.o. male that underwent right lower extremity arteriogram with atherectomy of his TP trunk and peroneal artery on 08/07/2018 for critical ischemia with tissue loss.  Ultimately he required going to the OR for fasciotomies after he had a hematoma in his popliteal space and subsequently got a right fifth toe amp.  Resents for 2-week follow-up after seeing him several weeks ago with marginal healing of the toe amp.  On evaluation today he states the toe really has not shown any signs of healing.  He previously had a right first and second toe amp on the right foot as well and a left BKA.  He states he is dialyzing Monday Wednesdays and Fridays.  He understands that there was high risk that this would not heal.  Past Medical History:  Diagnosis Date  . Anemia   . Atherosclerosis of lower extremity ([place])   . CAD (coronary artery disease)   . Chronic combined systolic and diastolic heart failure ([place])   . Complication of anesthesia   . ESRD (end stage renal disease) on dialysis [facility])    "MWF [employer]" (03/08/2017)  . GERD (gastroesophageal reflux disease)   . Heart murmur   . History of blood transfusion    "related to OR"  . Hypertension   . PONV (postoperative nausea and vomiting)   . S/P unilateral BKA (below knee amputation), left ([place])   . Type II diabetes mellitus ([place])     Past Surgical History:  Procedure Laterality Date  . ABDOMINAL AORTOGRAM N/A 11/02/2016   Procedure: Abdominal Aortogram;  Surgeon: Waynetta Sandy, MD;  Location: [place] [facility];  Service: Cardiovascular;  Laterality: N/A;  . ABDOMINAL AORTOGRAM W/LOWER EXTREMITY Right 08/07/2018   Procedure: ABDOMINAL AORTOGRAM W/LOWER EXTREMITY;  Surgeon: [name], MD;  Location: [place] [facility];  Service: Cardiovascular;  Laterality: Right;  . AMPUTATION Left 09/27/2013   Procedure: LEFT GREAT TOE AMPUTATION;  Surgeon: [name], MD;  Location: [place];  Service: Orthopedics;  Laterality: Left;  . AMPUTATION Right 08/15/2015   Procedure: Right Great Toe Amputation;  Surgeon: [name], MD;  Location: [place];  Service: Orthopedics;  Laterality: Right;  . AMPUTATION Left 11/05/2016   Procedure: TRANSMETATARSAL AMPUTATION LEFT FOOT;  Surgeon: [name], MD;  Location: [place];  Service: Orthopedics;  Laterality: Left;  . AMPUTATION Left 03/11/2017   Procedure: LEFT BELOW KNEE AMPUTATION;  Surgeon: [name], MD;  Location: [place];  Service: Orthopedics;  Laterality: Left;  . AMPUTATION Right 03/11/2017   Procedure: RIGHT 2ND TOE AMPUTATION;  Surgeon: [name], MD;  Location: [place];  Service: Orthopedics;  Laterality: Right;  . AV FISTULA PLACEMENT  left arm  . CORONARY ARTERY BYPASS GRAFT N/A 11/04/2017   Procedure: CORONARY ARTERY BYPASS GRAFTING (CABG) x three, using left internal mammary artery and right    leg greater saphenous vein;  Surgeon: Melrose Nakayama, MD;  Location: [place];  Service: Open Heart Surgery;  Laterality: N/A;  . FASCIOTOMY Right 08/07/2018   Procedure: FOUR COMPARTMENT FASCIOTOMY OF RIGHT LOWER LEG;  Surgeon: Rosetta Posner, MD;  Location: [place];  Service: Vascular;  Laterality: Right;  . FASCIOTOMY CLOSURE Right 08/10/2018   Procedure: FASCIOTOMY CLOSURE RIGHT LOWER EXTREMITY;  Surgeon: Carlis Abbott,  Gwenyth Allegra, MD;  Location: [place];  Service: Vascular;  Laterality: Right;  . HEMATOMA EVACUATION Right 08/07/2018   Procedure: EVACUATION HEMATOMA;  Surgeon: Rosetta Posner, MD;  Location: [place];  Service: Vascular;  Laterality: Right;  . LEFT HEART CATH AND CORONARY ANGIOGRAPHY N/A 10/31/2017   Procedure: LEFT HEART CATH AND CORONARY ANGIOGRAPHY;  Surgeon: Troy Sine, MD;  Location: [place] [facility];  Service: Cardiovascular;   Laterality: N/A;  . LEFT HEART CATHETERIZATION WITH CORONARY ANGIOGRAM N/A 09/13/2014   Procedure: LEFT HEART CATHETERIZATION WITH CORONARY ANGIOGRAM;  Surgeon: Sinclair Grooms, MD;  Location: [facility] CATH LAB;  Service: Cardiovascular;  Laterality: N/A;  . LOWER EXTREMITY ANGIOGRAPHY Bilateral 11/02/2016   Procedure: Lower Extremity Angiography;  Surgeon: Waynetta Sandy, MD;  Location: [place] [facility];  Service: Cardiovascular;  Laterality: Bilateral;  . PERIPHERAL VASCULAR ATHERECTOMY Left 11/02/2016   Procedure: Peripheral Vascular Atherectomy;  Surgeon: Waynetta Sandy, MD;  Location: [place] [facility];  Service: Cardiovascular;  Laterality: Left;  PERONEAL  . PERIPHERAL VASCULAR ATHERECTOMY Right 08/07/2018   Procedure: PERIPHERAL VASCULAR ATHERECTOMY;  Surgeon: [name], MD;  Location: [place] [facility];  Service: Cardiovascular;  Laterality: Right;  Peroneal artery  . PERIPHERAL VASCULAR BALLOON ANGIOPLASTY Right 08/07/2018   Procedure: PERIPHERAL VASCULAR BALLOON ANGIOPLASTY;  Surgeon: [name], MD;  Location: [place] [facility];  Service: Cardiovascular;  Laterality: Right;  anterior tibial artery  . STUMP REVISION Left 01/11/2017   Procedure: Revision Left Transmetatarsal Amputation;  Surgeon: [name], MD;  Location: [place];  Service: Orthopedics;  Laterality: Left;  . TEE WITHOUT CARDIOVERSION N/A 11/04/2017   Procedure: TRANSESOPHAGEAL ECHOCARDIOGRAM (TEE);  Surgeon: Melrose Nakayama, MD;  Location: [place];  Service: Open Heart Surgery;  Laterality: N/A;  . TRANSMETATARSAL AMPUTATION Right 08/10/2018   Procedure: RIGHT FIFTH TOE AMPUTATION;  Surgeon: [name], MD;  Location: [facility] OR;  Service: Vascular;  Laterality: Right;    Family History  Problem Relation Age of Onset  . Heart failure Mother   . Hypertension Mother     SOCIAL HISTORY: Social History   Tobacco Use  . Smoking status: Current Every Day Smoker     Packs/day: 0.50    Years: 26.00    Pack years: 13.00    Types: Cigarettes  . Smokeless tobacco: Never Used  Substance Use Topics  . Alcohol use: Yes    Comment: 03/08/2017 "haven't took a drink in over 5 years"    Allergies  Allergen Reactions  . Coconut Oil Anaphylaxis    Can use topically, allergic to coconut foods     Current Outpatient Medications  Medication Sig Dispense Refill  . acetaminophen (TYLENOL) 325 MG tablet Take 2 tablets (650 mg total) by mouth every 4 (four) hours as needed for headache or mild pain.    . Amino Acids-Protein Hydrolys (FEEDING SUPPLEMENT, PRO-STAT SUGAR FREE 64,) LIQD Take 30 mLs by mouth 2 (two) times daily. 7 Bottle 0  . aspirin EC 81 MG tablet Take 1 tablet (81 mg total) by mouth daily. 30 tablet 11  . atorvastatin (LIPITOR) 80 MG tablet Take 1 tablet (80 mg total) by mouth daily at 6 PM. 30 tablet 0  . calcitRIOL (ROCALTROL) 0.25 MCG capsule Take 5 capsules (1.25 mcg total) by mouth every Monday, Wednesday, and Friday with hemodialysis. 30 capsule 1  . carvedilol (COREG) 3.125 MG tablet Take 1 tablet (3.125 mg total) by mouth daily. 60 tablet  0  . cholecalciferol (VITAMIN D3) 25 MCG (1000 UT) tablet Take 1,000 Units by mouth daily.    . clopidogrel (PLAVIX) 75 MG tablet Take 1 tablet (75 mg total) by mouth daily. 30 tablet 6  . docusate sodium (COLACE) 100 MG capsule Take 100 mg by mouth daily.    Marland Kitchen glucagon (GLUCAGON EMERGENCY) 1 MG injection Inject 1 mg into the vein once as needed (low blood sugar and inability to eat or drink sugar). 1 each 0  . isosorbide mononitrate (IMDUR) 30 MG 24 hr tablet Take 1 tablet (30 mg total) by mouth daily. 30 tablet 0  . lamoTRIgine (LAMICTAL) 150 MG tablet Take 150 mg by mouth at bedtime.    Marland Kitchen lanthanum (FOSRENOL) 1000 MG chewable tablet Chew 2 tablets (2,000 mg total) by mouth 3 (three) times daily with meals. 180 tablet 0  . multivitamin (RENA-VIT) TABS tablet Take 1 tablet by mouth at bedtime. 30 tablet 0  .  sertraline (ZOLOFT) 100 MG tablet Take 50 mg by mouth daily.    . insulin glargine (LANTUS) 100 UNIT/ML injection Inject 0.1 mLs (10 Units total) into the skin at bedtime. (Patient not taking: Reported on 09/12/2018) 10 mL 11   No current facility-administered medications for this visit.     REVIEW OF SYSTEMS:  [X]  denotes positive finding, [ ]  denotes negative finding Cardiac  Comments:  Chest pain or chest pressure:    Shortness of breath upon exertion:    Short of breath when lying flat:    Irregular heart rhythm:        Vascular    Pain in calf, thigh, or hip brought on by ambulation:    Pain in feet at night that wakes you up from your sleep:     Blood clot in your veins:    Leg swelling:         Pulmonary    Oxygen at home:    Productive cough:     Wheezing:         Neurologic    Sudden weakness in arms or legs:     Sudden numbness in arms or legs:     Sudden onset of difficulty speaking or slurred speech:    Temporary loss of vision in one eye:     Problems with dizziness:         Gastrointestinal    Blood in stool:     Vomited blood:         Genitourinary    Burning when urinating:     Blood in urine:        Psychiatric    Major depression:         Hematologic    Bleeding problems:    Problems with blood clotting too easily:        Skin    Rashes or ulcers:        Constitutional    Fever or chills:      PHYSICAL EXAM: Vitals:   09/12/18 1043  BP: 131/76  Pulse: 69  Resp: 20  Temp: (!) 97.5 F (36.4 C)  SpO2: 95%  Weight: 178 lb (80.7 kg)  Height: 6\' 1"  (1.854 m)    GENERAL: The patient is a well-nourished male, in no acute distress. The vital signs are documented above. CARDIAC: There is a regular rate and rhythm.  VASCULAR:  R DP signal brisk L BKA previous well healed Right 5th toe amp now open with minimal granulation tissue  DATA:   None  Assessment/Plan:  43 year old male with critical limb ischemia the right lower  extremity with tissue loss.  He previously underwent right TP trunk and peroneal atherectomy as well as a right fifth toe amp and required fasciotomies for popliteal space hematoma.  Ultimately his right fifth toe amp has failed to heal and now the wound is opened.  I discussed the options of TMA versus BKA and patient would like to try and save his leg if possible given that he has a left-sided BKA.  I will arrange for a right TMA on Thursday which will be a nondialysis day.  Discussed with him he will be admitted to hospital at least one night for pain control.  He understands high risk that this could not healing well and may ultimately require BKA.  He has a brisk right dorsalis pedis signal and I think his revascularization is as good as we can make it.   [name], MD [facility] Office: [phone] Pager: [phone]

## 2018-09-13 ENCOUNTER — Encounter (HOSPITAL_COMMUNITY): Payer: Self-pay | Admitting: *Deleted

## 2018-09-13 ENCOUNTER — Other Ambulatory Visit: Payer: Self-pay

## 2018-09-13 NOTE — Anesthesia Preprocedure Evaluation (Addendum)
Anesthesia Evaluation  Patient identified by MRN, date of birth, ID band  Reviewed: Allergy & Precautions, NPO status , Patient's Chart, lab work & pertinent test results  History of Anesthesia Complications (+) PONV  Airway Mallampati: II  TM Distance: >3 FB     Dental   Pulmonary former smoker,    breath sounds clear to auscultation       Cardiovascular hypertension, + angina + CAD, + Past MI, + Peripheral Vascular Disease and +CHF  + Valvular Problems/Murmurs  Rhythm:Regular Rate:Normal     Neuro/Psych    GI/Hepatic Neg liver ROS, GERD  ,  Endo/Other  diabetes  Renal/GU Renal disease     Musculoskeletal   Abdominal   Peds  Hematology  (+) anemia ,   Anesthesia Other Findings   Reproductive/Obstetrics                            Anesthesia Physical Anesthesia Plan  ASA: III  Anesthesia Plan: General   Post-op Pain Management:    Induction: Intravenous  PONV Risk Score and Plan: Ondansetron, Dexamethasone and Midazolam  Airway Management Planned: Oral ETT  Additional Equipment:   Intra-op Plan:   Post-operative Plan: Extubation in OR  Informed Consent: I have reviewed the patients History and Physical, chart, labs and discussed the procedure including the risks, benefits and alternatives for the proposed anesthesia with the patient or authorized representative who has indicated his/her understanding and acceptance.     Dental advisory given  Plan Discussed with: CRNA and Anesthesiologist  Anesthesia Plan Comments: (See PAT note by Karoline Caldwell, PA-C  )       Anesthesia Quick Evaluation

## 2018-09-13 NOTE — Progress Notes (Signed)
Anesthesia Chart Review: SAME DAY WORKUP   Case:  [id number] Date/Time:  09/14/18 1200   Procedure:  TRANSMETATARSAL AMPUTATION (Right )   Anesthesia type:  Choice   Pre-op diagnosis:  nonviable tissue right foot   Location:  [facility] OR ROOM 11 / [place] [facility]   Surgeon:  [name], MD      DISCUSSION: 43 yo male former smoker. Pertinent hx includes CAD s/p CABG 10/2017, ESRD on HD, DM1, PAD. 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   Pt recently admitted 1/23-1/24/20 for chest pain evaluation. From admission H&P: "Today he was arrested for domestic violence. Was on way to see magistrate after being arrested for this and developed chest pain in the parking lot of the jail. He was subsequently brought to the emergency room." Cardiology consulted. Per Dr. Harrington Challenger' note, echo was reassuring with normal LVEF and no definite wall motion abnormalities, she did not have concern for endocarditis. No significant change from echo 10/2017. Imdur was added and felt okay to d/c form cardiac standpoint.  Anticipate he can proceed as planned barring acute status change and DOS labs acceptable.  VS: There were no vitals taken for this visit.  PROVIDERS: System, Pcp Not In  [facility] for nephrology care  Jenkins Rouge, MD is Cardiologist  LABS: Will need DOS labs  Labs Reviewed - No data to display   IMAGES: PORTABLE CHEST 1 VIEW 09/07/2018  COMPARISON:  August 04, 2018  FINDINGS: Lungs are clear. Heart size and pulmonary vascularity are normal. No adenopathy. Patient is status post coronary artery bypass grafting. There is aortic atherosclerosis. No pneumothorax. No bone lesions.  IMPRESSION: No edema or consolidation. There is aortic atherosclerosis. Patient is status post coronary artery bypass grafting.  Aortic Atherosclerosis (ICD10-I70.0).  EKG: 09/07/2018: Sinus rhythm. Rate 69. Borderline T abnormalities, lateral leads no interval change from  previous  CV: TTE 09/08/2018: Study Conclusions  - Left ventricle: The cavity size was mildly dilated. Wall   thickness was normal. Systolic function was normal. The estimated   ejection fraction was in the range of 50% to 55%. Wall motion was   normal; there were no regional wall motion abnormalities. Doppler   parameters are consistent with abnormal left ventricular   relaxation (grade 1 diastolic dysfunction). - Mitral valve: Severely calcified annulus. Mildly thickened   leaflets . - Left atrium: The atrium was mildly dilated. - Atrial septum: There was an atrial septal aneurysm.  Impressions:  - Normal LV systolic function; mild LVH; mild diastolic   dysfunction; severe MAC with possible small oscillating density   on posterior annulus (consider blood cultures if clinically   indicated); mild MR.  Past Medical History:  Diagnosis Date  . Anemia   . Atherosclerosis of lower extremity ([place])   . Blood clot in vein    right calf  . CAD (coronary artery disease)   . Cataracts, bilateral   . Chronic combined systolic and diastolic heart failure ([place])   . Complication of anesthesia   . Depression   . ESRD (end stage renal disease) on dialysis [facility])    "MWF [employer] [employer]" (03/08/2017)  . GERD (gastroesophageal reflux disease)   . Heart murmur   . History of blood transfusion    "related to OR"  . Hypertension   . Myocardial infarction ([place])     " light"  . Nonhealing surgical wound    nonviable tissue  . PONV (postoperative nausea and vomiting)   .  S/P unilateral BKA (below knee amputation), left ([place])   . Type II diabetes mellitus ([place])   . Wears glasses     Past Surgical History:  Procedure Laterality Date  . ABDOMINAL AORTOGRAM N/A 11/02/2016   Procedure: Abdominal Aortogram;  Surgeon: Waynetta Sandy, MD;  Location: [place] CV LAB;  Service: Cardiovascular;  Laterality: N/A;  . ABDOMINAL AORTOGRAM W/LOWER EXTREMITY Right 08/07/2018   Procedure:  ABDOMINAL AORTOGRAM W/LOWER EXTREMITY;  Surgeon: [name], MD;  Location: [place] [facility] LAB;  Service: Cardiovascular;  Laterality: Right;  . AMPUTATION Left 09/27/2013   Procedure: LEFT GREAT TOE AMPUTATION;  Surgeon: [name], MD;  Location: [place];  Service: Orthopedics;  Laterality: Left;  . AMPUTATION Right 08/15/2015   Procedure: Right Great Toe Amputation;  Surgeon: [name], MD;  Location: [place];  Service: Orthopedics;  Laterality: Right;  . AMPUTATION Left 11/05/2016   Procedure: TRANSMETATARSAL AMPUTATION LEFT FOOT;  Surgeon: [name], MD;  Location: [place];  Service: Orthopedics;  Laterality: Left;  . AMPUTATION Left 03/11/2017   Procedure: LEFT BELOW KNEE AMPUTATION;  Surgeon: [name], MD;  Location: [place];  Service: Orthopedics;  Laterality: Left;  . AMPUTATION Right 03/11/2017   Procedure: RIGHT 2ND TOE AMPUTATION;  Surgeon: [name], MD;  Location: [place];  Service: Orthopedics;  Laterality: Right;  . AV FISTULA PLACEMENT  left arm  . CORONARY ARTERY BYPASS GRAFT N/A 11/04/2017   Procedure: CORONARY ARTERY BYPASS GRAFTING (CABG) x three, using left internal mammary artery and right    leg greater saphenous vein;  Surgeon: Melrose Nakayama, MD;  Location: [place];  Service: Open Heart Surgery;  Laterality: N/A;  . FASCIOTOMY Right 08/07/2018   Procedure: FOUR COMPARTMENT FASCIOTOMY OF RIGHT LOWER LEG;  Surgeon: Rosetta Posner, MD;  Location: [place];  Service: Vascular;  Laterality: Right;  . FASCIOTOMY CLOSURE Right 08/10/2018   Procedure: FASCIOTOMY CLOSURE RIGHT LOWER EXTREMITY;  Surgeon: [name], MD;  Location: [place];  Service: Vascular;  Laterality: Right;  . HEMATOMA EVACUATION Right 08/07/2018   Procedure: EVACUATION HEMATOMA;  Surgeon: Rosetta Posner, MD;  Location: [place];  Service: Vascular;  Laterality: Right;  . LEFT HEART CATH AND CORONARY ANGIOGRAPHY N/A 10/31/2017   Procedure: LEFT HEART CATH AND CORONARY ANGIOGRAPHY;   Surgeon: Troy Sine, MD;  Location: [place] [facility] LAB;  Service: Cardiovascular;  Laterality: N/A;  . LEFT HEART CATHETERIZATION WITH CORONARY ANGIOGRAM N/A 09/13/2014   Procedure: LEFT HEART CATHETERIZATION WITH CORONARY ANGIOGRAM;  Surgeon: Sinclair Grooms, MD;  Location: [facility] CATH LAB;  Service: Cardiovascular;  Laterality: N/A;  . LOWER EXTREMITY ANGIOGRAPHY Bilateral 11/02/2016   Procedure: Lower Extremity Angiography;  Surgeon: Waynetta Sandy, MD;  Location: [place] [facility] LAB;  Service: Cardiovascular;  Laterality: Bilateral;  . PERIPHERAL VASCULAR ATHERECTOMY Left 11/02/2016   Procedure: Peripheral Vascular Atherectomy;  Surgeon: Waynetta Sandy, MD;  Location: [place] [facility] LAB;  Service: Cardiovascular;  Laterality: Left;  PERONEAL  . PERIPHERAL VASCULAR ATHERECTOMY Right 08/07/2018   Procedure: PERIPHERAL VASCULAR ATHERECTOMY;  Surgeon: [name], MD;  Location: [place] CV LAB;  Service: Cardiovascular;  Laterality: Right;  Peroneal artery  . PERIPHERAL VASCULAR BALLOON ANGIOPLASTY Right 08/07/2018   Procedure: PERIPHERAL VASCULAR BALLOON ANGIOPLASTY;  Surgeon: [name], MD;  Location: [place] [facility] LAB;  Service: Cardiovascular;  Laterality: Right;  anterior tibial artery  . STUMP REVISION Left 01/11/2017   Procedure:  Revision Left Transmetatarsal Amputation;  Surgeon: [name], MD;  Location: [place];  Service: Orthopedics;  Laterality: Left;  . TEE WITHOUT CARDIOVERSION N/A 11/04/2017   Procedure: TRANSESOPHAGEAL ECHOCARDIOGRAM (TEE);  Surgeon: Melrose Nakayama, MD;  Location: [place];  Service: Open Heart Surgery;  Laterality: N/A;  . TRANSMETATARSAL AMPUTATION Right 08/10/2018   Procedure: RIGHT FIFTH TOE AMPUTATION;  Surgeon: [name], MD;  Location: [place];  Service: Vascular;  Laterality: Right;    MEDICATIONS: No current facility-administered medications for this encounter.    Marland Kitchen aspirin EC 81 MG tablet  .  atorvastatin (LIPITOR) 80 MG tablet  . calcitRIOL (ROCALTROL) 0.25 MCG capsule  . carvedilol (COREG) 6.25 MG tablet  . clopidogrel (PLAVIX) 75 MG tablet  . glucagon (GLUCAGON EMERGENCY) 1 MG injection  . insulin glargine (LANTUS) 100 UNIT/ML injection  . lamoTRIgine (LAMICTAL) 150 MG tablet  . lanthanum (FOSRENOL) 1000 MG chewable tablet  . multivitamin (RENA-VIT) TABS tablet  . ondansetron (ZOFRAN) 4 MG tablet  . sertraline (ZOLOFT) 50 MG tablet  . acetaminophen (TYLENOL) 325 MG tablet  . Amino Acids-Protein Hydrolys (FEEDING SUPPLEMENT, PRO-STAT SUGAR FREE 64,) LIQD  . isosorbide mononitrate (IMDUR) 30 MG 24 hr tablet    [facility]ynonia Musty [facility] Short Stay Center/Anesthesiology Phone [phone] 09/13/2018 4:01 PM

## 2018-09-13 NOTE — Progress Notes (Signed)
Pt denies SOB and chest pain. Pt stated that his recent ED visit was " from stress. " Pt stated that he is under the care of Dr. Claiborne Billings, Cardiology. Pt stated that he has not started taking Imdur " it did not come yet."  Pt stated that he takes all of his medications at night. Pt stated that he stopped taking Plavix " yesterday."  Pt made aware to stop taking vitamins and herbal medications. Do not take any NSAIDs ie: Ibuprofen, Advil, Naproxen (Aleve), Motrin, BC and Goody Powder. Pt made aware to take 7 units of Lantus insulin at HS (instead of the usual 14). Pt made aware to check BG every 2 hours prior to arrival to hospital on DOS. Pt made aware to treat a BG < 70 with Glucagon as prescribed ( pt stated that he has no other intervention available), wait 15 minutes after intervention to recheck BG, if BG remains < 70, call Short Stay unit to speak with a nurse. Pt verbalized understanding of all pre-op instructions. Nurse requested that PA, Anesthesiology, review pt history.

## 2018-09-14 ENCOUNTER — Inpatient Hospital Stay (HOSPITAL_COMMUNITY)
Admission: RE | Admit: 2018-09-14 | Discharge: 2018-09-15 | DRG: 239 | Disposition: A | Discharge status: Home Health | Payer: Medicaid Other | Attending: Vascular Surgery | Admitting: Vascular Surgery

## 2018-09-14 ENCOUNTER — Encounter (HOSPITAL_COMMUNITY)
Admission: RE | Disposition: A | Discharge status: Home Health | Payer: Self-pay | Source: Home / Self Care | Attending: Vascular Surgery

## 2018-09-14 ENCOUNTER — Ambulatory Visit (HOSPITAL_COMMUNITY): Payer: Medicaid Other | Admitting: Physician Assistant

## 2018-09-14 ENCOUNTER — Encounter (HOSPITAL_COMMUNITY): Payer: Self-pay

## 2018-09-14 DIAGNOSIS — Z8249 Family history of ischemic heart disease and other diseases of the circulatory system: Secondary | ICD-10-CM | POA: Diagnosis not present

## 2018-09-14 DIAGNOSIS — N2581 Secondary hyperparathyroidism of renal origin: Secondary | ICD-10-CM | POA: Diagnosis present

## 2018-09-14 DIAGNOSIS — Z91018 Allergy to other foods: Secondary | ICD-10-CM | POA: Diagnosis not present

## 2018-09-14 DIAGNOSIS — I998 Other disorder of circulatory system: Secondary | ICD-10-CM | POA: Diagnosis present

## 2018-09-14 DIAGNOSIS — Z992 Dependence on renal dialysis: Secondary | ICD-10-CM | POA: Diagnosis not present

## 2018-09-14 DIAGNOSIS — I132 Hypertensive heart and chronic kidney disease with heart failure and with stage 5 chronic kidney disease, or end stage renal disease: Secondary | ICD-10-CM | POA: Diagnosis present

## 2018-09-14 DIAGNOSIS — Z951 Presence of aortocoronary bypass graft: Secondary | ICD-10-CM

## 2018-09-14 DIAGNOSIS — N186 End stage renal disease: Secondary | ICD-10-CM | POA: Diagnosis present

## 2018-09-14 DIAGNOSIS — T8189XA Other complications of procedures, not elsewhere classified, initial encounter: Secondary | ICD-10-CM | POA: Diagnosis present

## 2018-09-14 DIAGNOSIS — Z7902 Long term (current) use of antithrombotics/antiplatelets: Secondary | ICD-10-CM

## 2018-09-14 DIAGNOSIS — Z993 Dependence on wheelchair: Secondary | ICD-10-CM

## 2018-09-14 DIAGNOSIS — F17210 Nicotine dependence, cigarettes, uncomplicated: Secondary | ICD-10-CM | POA: Diagnosis present

## 2018-09-14 DIAGNOSIS — I2510 Atherosclerotic heart disease of native coronary artery without angina pectoris: Secondary | ICD-10-CM | POA: Diagnosis present

## 2018-09-14 DIAGNOSIS — Z89421 Acquired absence of other right toe(s): Secondary | ICD-10-CM

## 2018-09-14 DIAGNOSIS — Z89512 Acquired absence of left leg below knee: Secondary | ICD-10-CM

## 2018-09-14 DIAGNOSIS — T8789 Other complications of amputation stump: Secondary | ICD-10-CM | POA: Diagnosis not present

## 2018-09-14 DIAGNOSIS — I5042 Chronic combined systolic (congestive) and diastolic (congestive) heart failure: Secondary | ICD-10-CM | POA: Diagnosis present

## 2018-09-14 DIAGNOSIS — K219 Gastro-esophageal reflux disease without esophagitis: Secondary | ICD-10-CM | POA: Diagnosis present

## 2018-09-14 DIAGNOSIS — Z794 Long term (current) use of insulin: Secondary | ICD-10-CM

## 2018-09-14 DIAGNOSIS — T8189XS Other complications of procedures, not elsewhere classified, sequela: Secondary | ICD-10-CM | POA: Diagnosis not present

## 2018-09-14 DIAGNOSIS — Z89431 Acquired absence of right foot: Secondary | ICD-10-CM | POA: Diagnosis not present

## 2018-09-14 DIAGNOSIS — I252 Old myocardial infarction: Secondary | ICD-10-CM

## 2018-09-14 DIAGNOSIS — E1052 Type 1 diabetes mellitus with diabetic peripheral angiopathy with gangrene: Secondary | ICD-10-CM | POA: Diagnosis present

## 2018-09-14 DIAGNOSIS — E1022 Type 1 diabetes mellitus with diabetic chronic kidney disease: Secondary | ICD-10-CM | POA: Diagnosis present

## 2018-09-14 DIAGNOSIS — D631 Anemia in chronic kidney disease: Secondary | ICD-10-CM | POA: Diagnosis present

## 2018-09-14 DIAGNOSIS — Z89411 Acquired absence of right great toe: Secondary | ICD-10-CM | POA: Diagnosis not present

## 2018-09-14 DIAGNOSIS — Z79899 Other long term (current) drug therapy: Secondary | ICD-10-CM

## 2018-09-14 DIAGNOSIS — F329 Major depressive disorder, single episode, unspecified: Secondary | ICD-10-CM | POA: Diagnosis present

## 2018-09-14 HISTORY — DX: Unspecified cataract: H26.9

## 2018-09-14 HISTORY — DX: Acute embolism and thrombosis of unspecified vein: I82.90

## 2018-09-14 HISTORY — DX: Acute myocardial infarction, unspecified: I21.9

## 2018-09-14 HISTORY — DX: Presence of spectacles and contact lenses: Z97.3

## 2018-09-14 HISTORY — DX: Other complications of procedures, not elsewhere classified, initial encounter: T81.89XA

## 2018-09-14 HISTORY — PX: TRANSMETATARSAL AMPUTATION: SHX6197

## 2018-09-14 HISTORY — DX: Depression, unspecified: F32.A

## 2018-09-14 HISTORY — DX: Major depressive disorder, single episode, unspecified: F32.9

## 2018-09-14 LAB — POCT I-STAT 4, (NA,K, GLUC, HGB,HCT)
GLUCOSE: 135 mg/dL — AB (ref 70–99)
HCT: 49 % (ref 39.0–52.0)
Hemoglobin: 16.7 g/dL (ref 13.0–17.0)
POTASSIUM: 3.6 mmol/L (ref 3.5–5.1)
Sodium: 137 mmol/L (ref 135–145)

## 2018-09-14 LAB — GLUCOSE, CAPILLARY
Glucose-Capillary: 125 mg/dL — ABNORMAL HIGH (ref 70–99)
Glucose-Capillary: 105 mg/dL — ABNORMAL HIGH (ref 70–99)
Glucose-Capillary: 162 mg/dL — ABNORMAL HIGH (ref 70–99)
Glucose-Capillary: 122 mg/dL — ABNORMAL HIGH (ref 70–99)
Glucose-Capillary: 248 mg/dL — ABNORMAL HIGH (ref 70–99)

## 2018-09-14 SURGERY — AMPUTATION, FOOT, TRANSMETATARSAL
Anesthesia: General | Site: Foot | Laterality: Right

## 2018-09-14 MED ORDER — MIDAZOLAM HCL 2 MG/2ML IJ SOLN
INTRAMUSCULAR | Status: AC
  Filled 2018-09-14: qty 2

## 2018-09-14 MED ORDER — ONDANSETRON HCL 4 MG/2ML IJ SOLN
INTRAMUSCULAR | Status: AC
  Filled 2018-09-14: qty 2

## 2018-09-14 MED ORDER — INSULIN GLARGINE 100 UNIT/ML ~~LOC~~ SOLN
14.0000 [IU] | Freq: Every day | SUBCUTANEOUS | Status: DC
  Administered 2018-09-14: 14 [IU] via SUBCUTANEOUS
  Filled 2018-09-14 (×3): qty 0.14

## 2018-09-14 MED ORDER — CEFAZOLIN SODIUM-DEXTROSE 2-4 GM/100ML-% IV SOLN
2.0000 g | INTRAVENOUS | Status: AC
  Administered 2018-09-14: 2 g via INTRAVENOUS

## 2018-09-14 MED ORDER — METOPROLOL TARTRATE 5 MG/5ML IV SOLN: 2.0000 mg | INTRAVENOUS | Status: DC | PRN

## 2018-09-14 MED ORDER — LAMOTRIGINE 150 MG PO TABS
150.0000 mg | ORAL_TABLET | Freq: Every day | ORAL | Status: DC
  Administered 2018-09-14: 150 mg via ORAL
  Filled 2018-09-14 (×2): qty 1

## 2018-09-14 MED ORDER — FENTANYL CITRATE (PF) 250 MCG/5ML IJ SOLN
INTRAMUSCULAR | Status: AC
  Filled 2018-09-14: qty 5

## 2018-09-14 MED ORDER — INSULIN ASPART 100 UNIT/ML ~~LOC~~ SOLN
0.0000 [IU] | Freq: Three times a day (TID) | SUBCUTANEOUS | Status: DC
  Administered 2018-09-15: 2 [IU] via SUBCUTANEOUS
  Administered 2018-09-15: 3 [IU] via SUBCUTANEOUS

## 2018-09-14 MED ORDER — ASPIRIN EC 81 MG PO TBEC
81.0000 mg | DELAYED_RELEASE_TABLET | Freq: Every day | ORAL | Status: DC
  Administered 2018-09-15: 81 mg via ORAL
  Filled 2018-09-14 (×2): qty 1

## 2018-09-14 MED ORDER — FENTANYL CITRATE (PF) 100 MCG/2ML IJ SOLN
25.0000 ug | INTRAMUSCULAR | Status: DC | PRN
  Administered 2018-09-14 (×3): 50 ug via INTRAVENOUS

## 2018-09-14 MED ORDER — VANCOMYCIN HCL IN DEXTROSE 1-5 GM/200ML-% IV SOLN
1000.0000 mg | INTRAVENOUS | Status: AC
  Administered 2018-09-14: 1000 mg via INTRAVENOUS

## 2018-09-14 MED ORDER — GUAIFENESIN-DM 100-10 MG/5ML PO SYRP: 15.0000 mL | ORAL_SOLUTION | ORAL | Status: DC | PRN

## 2018-09-14 MED ORDER — HEPARIN SODIUM (PORCINE) 5000 UNIT/ML IJ SOLN
5000.0000 [IU] | Freq: Three times a day (TID) | INTRAMUSCULAR | Status: DC
  Administered 2018-09-14 – 2018-09-15 (×2): 5000 [IU] via SUBCUTANEOUS
  Filled 2018-09-14 (×2): qty 1

## 2018-09-14 MED ORDER — ONDANSETRON HCL 4 MG/2ML IJ SOLN
INTRAMUSCULAR | Status: DC | PRN
  Administered 2018-09-14: 4 mg via INTRAVENOUS

## 2018-09-14 MED ORDER — FENTANYL CITRATE (PF) 100 MCG/2ML IJ SOLN
INTRAMUSCULAR | Status: DC | PRN
  Administered 2018-09-14: 50 ug via INTRAVENOUS
  Administered 2018-09-14: 100 ug via INTRAVENOUS
  Administered 2018-09-14 (×6): 50 ug via INTRAVENOUS

## 2018-09-14 MED ORDER — ATORVASTATIN CALCIUM 80 MG PO TABS
80.0000 mg | ORAL_TABLET | Freq: Every day | ORAL | Status: DC
  Administered 2018-09-14: 80 mg via ORAL
  Filled 2018-09-14: qty 1

## 2018-09-14 MED ORDER — CEFAZOLIN SODIUM-DEXTROSE 2-4 GM/100ML-% IV SOLN: 2.0000 g | Freq: Three times a day (TID) | INTRAVENOUS | Status: DC

## 2018-09-14 MED ORDER — CHLORHEXIDINE GLUCONATE 4 % EX LIQD: 60.0000 mL | Freq: Once | CUTANEOUS | Status: DC

## 2018-09-14 MED ORDER — CLOPIDOGREL BISULFATE 75 MG PO TABS
75.0000 mg | ORAL_TABLET | Freq: Every day | ORAL | Status: DC
  Administered 2018-09-15: 75 mg via ORAL
  Filled 2018-09-14 (×2): qty 1

## 2018-09-14 MED ORDER — HYDRALAZINE HCL 20 MG/ML IJ SOLN: 5.0000 mg | INTRAMUSCULAR | Status: DC | PRN

## 2018-09-14 MED ORDER — ALUM & MAG HYDROXIDE-SIMETH 200-200-20 MG/5ML PO SUSP: 15.0000 mL | ORAL | Status: DC | PRN

## 2018-09-14 MED ORDER — SODIUM CHLORIDE 0.9 % IV SOLN
INTRAVENOUS | Status: DC
  Administered 2018-09-14: 11:00:00 via INTRAVENOUS

## 2018-09-14 MED ORDER — PROPOFOL 10 MG/ML IV BOLUS
INTRAVENOUS | Status: DC | PRN
  Administered 2018-09-14: 40 mg via INTRAVENOUS
  Administered 2018-09-14: 20 mg via INTRAVENOUS
  Administered 2018-09-14: 40 mg via INTRAVENOUS
  Administered 2018-09-14: 50 mg via INTRAVENOUS
  Administered 2018-09-14: 150 mg via INTRAVENOUS
  Administered 2018-09-14: 30 mg via INTRAVENOUS

## 2018-09-14 MED ORDER — MAGNESIUM SULFATE 2 GM/50ML IV SOLN: 2.0000 g | Freq: Every day | INTRAVENOUS | Status: DC | PRN

## 2018-09-14 MED ORDER — POTASSIUM CHLORIDE CRYS ER 20 MEQ PO TBCR: 20.0000 meq | EXTENDED_RELEASE_TABLET | Freq: Every day | ORAL | Status: DC | PRN

## 2018-09-14 MED ORDER — MIDAZOLAM HCL 5 MG/5ML IJ SOLN
INTRAMUSCULAR | Status: DC | PRN
  Administered 2018-09-14: 2 mg via INTRAVENOUS

## 2018-09-14 MED ORDER — FENTANYL CITRATE (PF) 100 MCG/2ML IJ SOLN
INTRAMUSCULAR | Status: AC
  Filled 2018-09-14: qty 2

## 2018-09-14 MED ORDER — LIDOCAINE 2% (20 MG/ML) 5 ML SYRINGE
INTRAMUSCULAR | Status: AC
  Filled 2018-09-14: qty 5

## 2018-09-14 MED ORDER — CEFAZOLIN SODIUM-DEXTROSE 2-4 GM/100ML-% IV SOLN
INTRAVENOUS | Status: AC
  Filled 2018-09-14: qty 100

## 2018-09-14 MED ORDER — VANCOMYCIN HCL IN DEXTROSE 1-5 GM/200ML-% IV SOLN
INTRAVENOUS | Status: AC
  Filled 2018-09-14: qty 200

## 2018-09-14 MED ORDER — ONDANSETRON HCL 4 MG PO TABS: 4.0000 mg | ORAL_TABLET | Freq: Four times a day (QID) | ORAL | Status: DC | PRN

## 2018-09-14 MED ORDER — OXYCODONE-ACETAMINOPHEN 5-325 MG PO TABS
1.0000 | ORAL_TABLET | ORAL | Status: DC | PRN
  Administered 2018-09-14 – 2018-09-15 (×4): 2 via ORAL
  Administered 2018-09-15: 1 via ORAL
  Filled 2018-09-14 (×5): qty 2

## 2018-09-14 MED ORDER — LABETALOL HCL 5 MG/ML IV SOLN
10.0000 mg | INTRAVENOUS | Status: DC | PRN
  Administered 2018-09-14: 10 mg via INTRAVENOUS

## 2018-09-14 MED ORDER — DEXAMETHASONE SODIUM PHOSPHATE 10 MG/ML IJ SOLN
INTRAMUSCULAR | Status: AC
  Filled 2018-09-14: qty 1

## 2018-09-14 MED ORDER — ACETAMINOPHEN 325 MG PO TABS: 325.0000 mg | ORAL_TABLET | ORAL | Status: DC | PRN

## 2018-09-14 MED ORDER — ONDANSETRON HCL 4 MG/2ML IJ SOLN: 4.0000 mg | Freq: Four times a day (QID) | INTRAMUSCULAR | Status: DC | PRN

## 2018-09-14 MED ORDER — KETOROLAC TROMETHAMINE 30 MG/ML IJ SOLN
30.0000 mg | Freq: Once | INTRAMUSCULAR | Status: AC
  Administered 2018-09-14: 30 mg via INTRAVENOUS
  Filled 2018-09-14: qty 1

## 2018-09-14 MED ORDER — SODIUM CHLORIDE 0.9 % IV SOLN: INTRAVENOUS | Status: DC

## 2018-09-14 MED ORDER — LIDOCAINE 2% (20 MG/ML) 5 ML SYRINGE
INTRAMUSCULAR | Status: DC | PRN
  Administered 2018-09-14: 100 mg via INTRAVENOUS

## 2018-09-14 MED ORDER — ISOSORBIDE MONONITRATE ER 30 MG PO TB24
30.0000 mg | ORAL_TABLET | Freq: Every day | ORAL | Status: DC
  Administered 2018-09-14 – 2018-09-15 (×2): 30 mg via ORAL
  Filled 2018-09-14 (×2): qty 1

## 2018-09-14 MED ORDER — PHENOL 1.4 % MT LIQD: 1.0000 | OROMUCOSAL | Status: DC | PRN

## 2018-09-14 MED ORDER — 0.9 % SODIUM CHLORIDE (POUR BTL) OPTIME
TOPICAL | Status: DC | PRN
  Administered 2018-09-14: 1000 mL

## 2018-09-14 MED ORDER — MORPHINE SULFATE (PF) 2 MG/ML IV SOLN
2.0000 mg | INTRAVENOUS | Status: DC | PRN
  Administered 2018-09-14 – 2018-09-15 (×6): 2 mg via INTRAVENOUS
  Filled 2018-09-14 (×4): qty 1

## 2018-09-14 MED ORDER — SERTRALINE HCL 50 MG PO TABS
50.0000 mg | ORAL_TABLET | Freq: Every day | ORAL | Status: DC
  Administered 2018-09-14 – 2018-09-15 (×2): 50 mg via ORAL
  Filled 2018-09-14 (×2): qty 1

## 2018-09-14 MED ORDER — ACETAMINOPHEN 325 MG RE SUPP: 325.0000 mg | RECTAL | Status: DC | PRN

## 2018-09-14 MED ORDER — CARVEDILOL 6.25 MG PO TABS
6.2500 mg | ORAL_TABLET | Freq: Every day | ORAL | Status: DC
  Administered 2018-09-14: 6.25 mg via ORAL
  Filled 2018-09-14: qty 1

## 2018-09-14 MED ORDER — PANTOPRAZOLE SODIUM 40 MG PO TBEC
40.0000 mg | DELAYED_RELEASE_TABLET | Freq: Every day | ORAL | Status: DC
  Administered 2018-09-14 – 2018-09-15 (×2): 40 mg via ORAL
  Filled 2018-09-14 (×2): qty 1

## 2018-09-14 MED ORDER — DOCUSATE SODIUM 100 MG PO CAPS
100.0000 mg | ORAL_CAPSULE | Freq: Every day | ORAL | Status: DC
  Administered 2018-09-15: 100 mg via ORAL
  Filled 2018-09-14 (×2): qty 1

## 2018-09-14 SURGICAL SUPPLY — 37 items
BANDAGE ACE 4X5 VEL STRL LF (GAUZE/BANDAGES/DRESSINGS) ×2 IMPLANT
BANDAGE ELASTIC 4 VELCRO ST LF (GAUZE/BANDAGES/DRESSINGS) ×1 IMPLANT
BLADE AVERAGE 25X9 (BLADE) IMPLANT
BLADE CORE FAN STRYKER (BLADE) ×1 IMPLANT
BNDG GAUZE ELAST 4 BULKY (GAUZE/BANDAGES/DRESSINGS) ×2 IMPLANT
CANISTER SUCT 3000ML PPV (MISCELLANEOUS) ×2 IMPLANT
COVER SURGICAL LIGHT HANDLE (MISCELLANEOUS) ×2 IMPLANT
COVER WAND RF STERILE (DRAPES) ×2 IMPLANT
DRAPE HALF SHEET 40X57 (DRAPES) ×2 IMPLANT
DRSG ADAPTIC 3X8 NADH LF (GAUZE/BANDAGES/DRESSINGS) ×2 IMPLANT
ELECT REM PT RETURN 9FT ADLT (ELECTROSURGICAL) ×2
ELECTRODE REM PT RTRN 9FT ADLT (ELECTROSURGICAL) ×1 IMPLANT
GAUZE SPONGE 4X4 12PLY STRL (GAUZE/BANDAGES/DRESSINGS) ×2 IMPLANT
GAUZE SPONGE 4X4 12PLY STRL LF (GAUZE/BANDAGES/DRESSINGS) ×1 IMPLANT
GLOVE BIO SURGEON STRL SZ7.5 (GLOVE) ×2 IMPLANT
GLOVE BIOGEL PI IND STRL 8 (GLOVE) ×1 IMPLANT
GLOVE BIOGEL PI INDICATOR 8 (GLOVE) ×1
GOWN STRL REUS W/ TWL LRG LVL3 (GOWN DISPOSABLE) ×2 IMPLANT
GOWN STRL REUS W/ TWL XL LVL3 (GOWN DISPOSABLE) ×2 IMPLANT
GOWN STRL REUS W/TWL LRG LVL3 (GOWN DISPOSABLE) ×4
GOWN STRL REUS W/TWL XL LVL3 (GOWN DISPOSABLE) ×4
KIT BASIN OR (CUSTOM PROCEDURE TRAY) ×2 IMPLANT
KIT TURNOVER KIT B (KITS) ×2 IMPLANT
NS IRRIG 1000ML POUR BTL (IV SOLUTION) ×2 IMPLANT
PACK GENERAL/GYN (CUSTOM PROCEDURE TRAY) ×2 IMPLANT
PAD ARMBOARD 7.5X6 YLW CONV (MISCELLANEOUS) ×4 IMPLANT
STAPLER VISISTAT 35W (STAPLE) ×1 IMPLANT
SUT ETHILON 3 0 PS 1 (SUTURE) ×2 IMPLANT
SUT SILK 3 0 (SUTURE) ×2
SUT SILK 3-0 18XBRD TIE 12 (SUTURE) IMPLANT
SUT VIC AB 3-0 SH 27 (SUTURE) ×2
SUT VIC AB 3-0 SH 27X BRD (SUTURE) IMPLANT
SUT VIC AB 3-0 SH 8-18 (SUTURE) ×1 IMPLANT
TOWEL GREEN STERILE (TOWEL DISPOSABLE) ×4 IMPLANT
TOWEL GREEN STERILE FF (TOWEL DISPOSABLE) ×2 IMPLANT
UNDERPAD 30X30 (UNDERPADS AND DIAPERS) ×2 IMPLANT
WATER STERILE IRR 1000ML POUR (IV SOLUTION) ×2 IMPLANT

## 2018-09-14 NOTE — Progress Notes (Signed)
PHARMACY NOTE:  ANTIMICROBIAL RENAL DOSAGE ADJUSTMENT  Current antimicrobial regimen includes a mismatch between antimicrobial dosage and estimated renal function.  As per policy approved by the Pharmacy & Therapeutics and Medical Executive Committees, the antimicrobial dosage will be adjusted accordingly.  Current antimicrobial dosage:  Cefazolin 2gm IV q8hrs x 2 doses  Indication: surgical prophylaxis  Renal Function:  Estimated Creatinine Clearance: 12 mL/min (A) (by C-G formula based on SCr of 9.34 mg/dL (H)). [x]      On intermittent HD, scheduled: usual MWF.  []      On CRRT    Antimicrobial dosage has been changed to:  Post-op doses cancelled  Additional comments:  Cefazolin 2gm IV and Vancomycin 1gm IV given pre-op at ~2pm.   Thank you for allowing pharmacy to be a part of this patient's care.  Arty Baumgartner, [facility]  Pager: [phone] 09/14/2018 4:49 PM

## 2018-09-14 NOTE — Anesthesia Postprocedure Evaluation (Signed)
Anesthesia Post Note  Patient: [name].  Procedure(s) Performed: TRANSMETATARSAL AMPUTATION (Right Foot)     Patient location during evaluation: PACU Anesthesia Type: General Level of consciousness: awake Pain management: pain level controlled Vital Signs Assessment: post-procedure vital signs reviewed and stable Respiratory status: spontaneous breathing Cardiovascular status: stable Postop Assessment: no apparent nausea or vomiting Anesthetic complications: no    Last Vitals:  Vitals:   09/14/18 1552 09/14/18 1600  BP: 138/71 (!) 166/78  Pulse: 62 91  Resp: (!) 28 13  Temp:  36.7 C  SpO2: 98% 97%    Last Pain:  Vitals:   09/14/18 1522  TempSrc:   PainSc: Asleep                 Tela Kotecki

## 2018-09-14 NOTE — Op Note (Addendum)
Date: September 14, 2018  Preoperative diagnosis: Necrotic right fifth toe wound status post right fifth toe amp in setting of critical limb ischemia with tissue loss  Postoperative diagnosis: same  Urgent: Dr. [name], MD  System: OR staff  Procedure: Right transmetatarsal amputation  Indications: Patient is a 43 year old male who was previously evaluated for critical ischemia of the right lower extremity with a nonhealing right fifth toe wound.  Ultimately he underwent right lower extremity arteriogram with tibioperoneal trunk and peroneal atherectomy.  He presents today after failure of his right fifth toe amp to heal after risks and benefits discussed.  Findings: Right transmetatarsal amputation with pulsatile bleeding from the dorsalis pedis, no flow in the posterior tibial.  All the tissue appeared viable.  Details: Patient was taken to the operating room after informed consent was obtained.  He was placed on the operative table in the supine position.  Right leg was prepped draped in standard sterile fashion.  Performed preop timeout to identify patient, procedure, and site.  Initially I marked out a posterior flap on the right foot in order to safely remove his necrotic right fifth toe wound base.  Ultimately used a 15 blade scalpel and carried our anterior and posterior dissection flap.  There was pulsatile bleeding from the dorsalis pedis artery that was ligated with 3-0 silk tie.  I then used Bovie cautery and dissected out large posterior flap and isolated all the metatarsals.  Ultimately a oscillating power saw was used to transect all 5 metatarsal heads.  I did transect these in a anterior posterior direction for offloading.  I had to carry my TMA more posterior than typical in order to get a healthy posterior flap.  Ultimately the wound was then copiously irrigated.  I trimmed the flap accordingly with appropriate size with 15 blade scalpel.  I then closed the subcutaneous  tissue with 3-0 Vicryl in interrupted fashion.  The skin was closed with staples.  Sterile dressing was applied.  Condition: Stable  [name], MD [facility] Office: [phone] Pager: [place]

## 2018-09-14 NOTE — H&P (Signed)
History and Physical Interval Note:  09/14/2018 1:53 PM  [name] Labella Sr.  has presented today for surgery, with the diagnosis of nonviable tissue right foot  The various methods of treatment have been discussed with the patient and family. After consideration of risks, benefits and other options for treatment, the patient has consented to  Procedure(s): TRANSMETATARSAL AMPUTATION (Right) as a surgical intervention .  The patient's history has been reviewed, patient examined, no change in status, stable for surgery.  I have reviewed the patient's chart and labs.  Questions were answered to the patient's satisfaction.    Right TMA  [name]  Patient name: [name] Barbary Sr.       MRN: [id number]        DOB: 07/26/1976          Sex: male  REASON FOR VISIT: Follow-up after right lower extremity endovascular intervention/R 5th toe amp  HPI: [name] Mroczka Sr. is a 43 y.o. male that underwent right lower extremity arteriogram with atherectomy of his TP trunk and peroneal artery on 08/07/2018 for critical ischemia with tissue loss.  Ultimately he required going to the OR for fasciotomies after he had a hematoma in his popliteal space and subsequently got a right fifth toe amp.  Resents for 2-week follow-up after seeing him several weeks ago with marginal healing of the toe amp.  On evaluation today he states the toe really has not shown any signs of healing.  He previously had a right first and second toe amp on the right foot as well and a left BKA.  He states he is dialyzing Monday Wednesdays and Fridays.  He understands that there was high risk that this would not heal.      Past Medical History:  Diagnosis Date  . Anemia   . Atherosclerosis of lower extremity ([place])   . CAD (coronary artery disease)   . Chronic combined systolic and diastolic heart failure ([place])   . Complication of anesthesia   . ESRD (end stage renal disease) on dialysis [facility])     "MWF [employer]" (03/08/2017)  . GERD (gastroesophageal reflux disease)   . Heart murmur   . History of blood transfusion    "related to OR"  . Hypertension   . PONV (postoperative nausea and vomiting)   . S/P unilateral BKA (below knee amputation), left ([place])   . Type II diabetes mellitus ([place])          Past Surgical History:  Procedure Laterality Date  . ABDOMINAL AORTOGRAM N/A 11/02/2016   Procedure: Abdominal Aortogram;  Surgeon: Waynetta Sandy, MD;  Location: [place] [facility];  Service: Cardiovascular;  Laterality: N/A;  . ABDOMINAL AORTOGRAM W/LOWER EXTREMITY Right 08/07/2018   Procedure: ABDOMINAL AORTOGRAM W/LOWER EXTREMITY;  Surgeon: [name], MD;  Location: [place] [facility];  Service: Cardiovascular;  Laterality: Right;  . AMPUTATION Left 09/27/2013   Procedure: LEFT GREAT TOE AMPUTATION;  Surgeon: [name], MD;  Location: [place];  Service: Orthopedics;  Laterality: Left;  . AMPUTATION Right 08/15/2015   Procedure: Right Great Toe Amputation;  Surgeon: [name], MD;  Location: [place];  Service: Orthopedics;  Laterality: Right;  . AMPUTATION Left 11/05/2016   Procedure: TRANSMETATARSAL AMPUTATION LEFT FOOT;  Surgeon: [name], MD;  Location: [place];  Service: Orthopedics;  Laterality: Left;  . AMPUTATION Left 03/11/2017   Procedure: LEFT BELOW KNEE AMPUTATION;  Surgeon: [name], MD;  Location: [place];  Service: Orthopedics;  Laterality: Left;  . AMPUTATION Right 03/11/2017   Procedure: RIGHT 2ND TOE AMPUTATION;  Surgeon: [name], MD;  Location: [place];  Service: Orthopedics;  Laterality: Right;  . AV FISTULA PLACEMENT  left arm  . CORONARY ARTERY BYPASS GRAFT N/A 11/04/2017   Procedure: CORONARY ARTERY BYPASS GRAFTING (CABG) x three, using left internal mammary artery and right    leg greater saphenous vein;  Surgeon: Melrose Nakayama, MD;  Location: [place];  Service: Open Heart Surgery;  Laterality: N/A;  .  FASCIOTOMY Right 08/07/2018   Procedure: FOUR COMPARTMENT FASCIOTOMY OF RIGHT LOWER LEG;  Surgeon: Rosetta Posner, MD;  Location: [place];  Service: Vascular;  Laterality: Right;  . FASCIOTOMY CLOSURE Right 08/10/2018   Procedure: FASCIOTOMY CLOSURE RIGHT LOWER EXTREMITY;  Surgeon: [name], MD;  Location: [place];  Service: Vascular;  Laterality: Right;  . HEMATOMA EVACUATION Right 08/07/2018   Procedure: EVACUATION HEMATOMA;  Surgeon: Rosetta Posner, MD;  Location: [place];  Service: Vascular;  Laterality: Right;  . LEFT HEART CATH AND CORONARY ANGIOGRAPHY N/A 10/31/2017   Procedure: LEFT HEART CATH AND CORONARY ANGIOGRAPHY;  Surgeon: Troy Sine, MD;  Location: [place] [facility];  Service: Cardiovascular;  Laterality: N/A;  . LEFT HEART CATHETERIZATION WITH CORONARY ANGIOGRAM N/A 09/13/2014   Procedure: LEFT HEART CATHETERIZATION WITH CORONARY ANGIOGRAM;  Surgeon: Sinclair Grooms, MD;  Location: [facility] CATH LAB;  Service: Cardiovascular;  Laterality: N/A;  . LOWER EXTREMITY ANGIOGRAPHY Bilateral 11/02/2016   Procedure: Lower Extremity Angiography;  Surgeon: Waynetta Sandy, MD;  Location: [place] [facility];  Service: Cardiovascular;  Laterality: Bilateral;  . PERIPHERAL VASCULAR ATHERECTOMY Left 11/02/2016   Procedure: Peripheral Vascular Atherectomy;  Surgeon: Waynetta Sandy, MD;  Location: [place] [facility];  Service: Cardiovascular;  Laterality: Left;  PERONEAL  . PERIPHERAL VASCULAR ATHERECTOMY Right 08/07/2018   Procedure: PERIPHERAL VASCULAR ATHERECTOMY;  Surgeon: [name], MD;  Location: [place] [facility];  Service: Cardiovascular;  Laterality: Right;  Peroneal artery  . PERIPHERAL VASCULAR BALLOON ANGIOPLASTY Right 08/07/2018   Procedure: PERIPHERAL VASCULAR BALLOON ANGIOPLASTY;  Surgeon: [name], MD;  Location: [place] [facility];  Service: Cardiovascular;  Laterality: Right;  anterior tibial artery  . STUMP REVISION Left  01/11/2017   Procedure: Revision Left Transmetatarsal Amputation;  Surgeon: [name], MD;  Location: [place];  Service: Orthopedics;  Laterality: Left;  . TEE WITHOUT CARDIOVERSION N/A 11/04/2017   Procedure: TRANSESOPHAGEAL ECHOCARDIOGRAM (TEE);  Surgeon: Melrose Nakayama, MD;  Location: [place];  Service: Open Heart Surgery;  Laterality: N/A;  . TRANSMETATARSAL AMPUTATION Right 08/10/2018   Procedure: RIGHT FIFTH TOE AMPUTATION;  Surgeon: [name], MD;  Location: [facility] OR;  Service: Vascular;  Laterality: Right;         Family History  Problem Relation Age of Onset  . Heart failure Mother   . Hypertension Mother     SOCIAL HISTORY: Social History        Tobacco Use  . Smoking status: Current Every Day Smoker    Packs/day: 0.50    Years: 26.00    Pack years: 13.00    Types: Cigarettes  . Smokeless tobacco: Never Used  Substance Use Topics  . Alcohol use: Yes    Comment: 03/08/2017 "haven't took a drink in over 5 years"         Allergies  Allergen Reactions  . Coconut Oil Anaphylaxis    Can  use topically, allergic to coconut foods           Current Outpatient Medications  Medication Sig Dispense Refill  . acetaminophen (TYLENOL) 325 MG tablet Take 2 tablets (650 mg total) by mouth every 4 (four) hours as needed for headache or mild pain.    . Amino Acids-Protein Hydrolys (FEEDING SUPPLEMENT, PRO-STAT SUGAR FREE 64,) LIQD Take 30 mLs by mouth 2 (two) times daily. 7 Bottle 0  . aspirin EC 81 MG tablet Take 1 tablet (81 mg total) by mouth daily. 30 tablet 11  . atorvastatin (LIPITOR) 80 MG tablet Take 1 tablet (80 mg total) by mouth daily at 6 PM. 30 tablet 0  . calcitRIOL (ROCALTROL) 0.25 MCG capsule Take 5 capsules (1.25 mcg total) by mouth every Monday, Wednesday, and Friday with hemodialysis. 30 capsule 1  . carvedilol (COREG) 3.125 MG tablet Take 1 tablet (3.125 mg total) by mouth daily. 60 tablet 0  . cholecalciferol (VITAMIN  D3) 25 MCG (1000 UT) tablet Take 1,000 Units by mouth daily.    . clopidogrel (PLAVIX) 75 MG tablet Take 1 tablet (75 mg total) by mouth daily. 30 tablet 6  . docusate sodium (COLACE) 100 MG capsule Take 100 mg by mouth daily.    Marland Kitchen glucagon (GLUCAGON EMERGENCY) 1 MG injection Inject 1 mg into the vein once as needed (low blood sugar and inability to eat or drink sugar). 1 each 0  . isosorbide mononitrate (IMDUR) 30 MG 24 hr tablet Take 1 tablet (30 mg total) by mouth daily. 30 tablet 0  . lamoTRIgine (LAMICTAL) 150 MG tablet Take 150 mg by mouth at bedtime.    Marland Kitchen lanthanum (FOSRENOL) 1000 MG chewable tablet Chew 2 tablets (2,000 mg total) by mouth 3 (three) times daily with meals. 180 tablet 0  . multivitamin (RENA-VIT) TABS tablet Take 1 tablet by mouth at bedtime. 30 tablet 0  . sertraline (ZOLOFT) 100 MG tablet Take 50 mg by mouth daily.    . insulin glargine (LANTUS) 100 UNIT/ML injection Inject 0.1 mLs (10 Units total) into the skin at bedtime. (Patient not taking: Reported on 09/12/2018) 10 mL 11   No current facility-administered medications for this visit.     REVIEW OF SYSTEMS:  [X]  denotes positive finding, [ ]  denotes negative finding Cardiac  Comments:  Chest pain or chest pressure:    Shortness of breath upon exertion:    Short of breath when lying flat:    Irregular heart rhythm:        Vascular    Pain in calf, thigh, or hip brought on by ambulation:    Pain in feet at night that wakes you up from your sleep:     Blood clot in your veins:    Leg swelling:         Pulmonary    Oxygen at home:    Productive cough:     Wheezing:         Neurologic    Sudden weakness in arms or legs:     Sudden numbness in arms or legs:     Sudden onset of difficulty speaking or slurred speech:    Temporary loss of vision in one eye:     Problems with dizziness:         Gastrointestinal    Blood in stool:     Vomited  blood:         Genitourinary    Burning when urinating:     Blood in  urine:        Psychiatric    Major depression:         Hematologic    Bleeding problems:    Problems with blood clotting too easily:        Skin    Rashes or ulcers:        Constitutional    Fever or chills:      PHYSICAL EXAM:    Vitals:   09/12/18 1043  BP: 131/76  Pulse: 69  Resp: 20  Temp: (!) 97.5 F (36.4 C)  SpO2: 95%  Weight: 178 lb (80.7 kg)  Height: 6\' 1"  (1.854 m)    GENERAL: The patient is a well-nourished male, in no acute distress. The vital signs are documented above. CARDIAC: There is a regular rate and rhythm.  VASCULAR:  R DP signal brisk L BKA previous well healed Right 5th toe amp now open with minimal granulation tissue    DATA:   None  Assessment/Plan:  43 year old male with critical limb ischemia the right lower extremity with tissue loss.  He previously underwent right TP trunk and peroneal atherectomy as well as a right fifth toe amp and required fasciotomies for popliteal space hematoma.  Ultimately his right fifth toe amp has failed to heal and now the wound is opened.  I discussed the options of TMA versus BKA and patient would like to try and save his leg if possible given that he has a left-sided BKA.  I will arrange for a right TMA on Thursday which will be a nondialysis day.  Discussed with him he will be admitted to hospital at least one night for pain control.  He understands high risk that this could not healing well and may ultimately require BKA.  He has a brisk right dorsalis pedis signal and I think his revascularization is as good as we can make it.   [name], MD [facility] Office: [phone] Pager: [phone]

## 2018-09-14 NOTE — Progress Notes (Signed)
Orthopedic Tech Progress Note Patient Details:  Shivan Hodes Sr. 1976-07-30 [id number]  Patient ID: [name], male   DOB: [birth year], 43 y.o.   MRN: [id number] Called in order to bio tech.  Karolee Stamps 09/14/2018, 3:53 PM

## 2018-09-14 NOTE — Anesthesia Procedure Notes (Signed)
Procedure Name: LMA Insertion Date/Time: 09/14/2018 2:11 PM Performed by: Colin Benton, CRNA Pre-anesthesia Checklist: Patient identified, Emergency Drugs available, Suction available and Patient being monitored Patient Re-evaluated:Patient Re-evaluated prior to induction Oxygen Delivery Method: Circle system utilized Preoxygenation: Pre-oxygenation with 100% oxygen Induction Type: IV induction Ventilation: Mask ventilation without difficulty LMA: LMA inserted LMA Size: 5.0 Number of attempts: 1 Placement Confirmation: positive ETCO2 and breath sounds checked- equal and bilateral Tube secured with: Tape Dental Injury: Teeth and Oropharynx as per pre-operative assessment

## 2018-09-14 NOTE — Transfer of Care (Signed)
Immediate Anesthesia Transfer of Care Note  Patient: [name] Stitely Sr.  Procedure(s) Performed: TRANSMETATARSAL AMPUTATION (Right Foot)  Patient Location: PACU  Anesthesia Type:General  Level of Consciousness: drowsy  Airway & Oxygen Therapy: Patient Spontanous Breathing and Patient connected to face mask oxygen  Post-op Assessment: Report given to RN, Post -op Vital signs reviewed and stable and Patient moving all extremities X 4  Post vital signs: Reviewed and stable  Last Vitals:  Vitals Value Taken Time  BP 122/69 09/14/2018  3:22 PM  Temp 36.7 C 09/14/2018  3:22 PM  Pulse 97 09/14/2018  3:29 PM  Resp 9 09/14/2018  3:29 PM  SpO2 96 % 09/14/2018  3:29 PM  Vitals shown include unvalidated device data.  Last Pain:  Vitals:   09/14/18 1522  TempSrc:   PainSc: Asleep      Patients Stated Pain Goal: 2 (64/15/83 0940)  Complications: No apparent anesthesia complications

## 2018-09-15 ENCOUNTER — Encounter (HOSPITAL_COMMUNITY): Payer: Self-pay | Admitting: Vascular Surgery

## 2018-09-15 DIAGNOSIS — T8189XS Other complications of procedures, not elsewhere classified, sequela: Secondary | ICD-10-CM

## 2018-09-15 DIAGNOSIS — Z89431 Acquired absence of right foot: Secondary | ICD-10-CM

## 2018-09-15 LAB — BASIC METABOLIC PANEL
Anion gap: 18 — ABNORMAL HIGH (ref 5–15)
BUN: 47 mg/dL — ABNORMAL HIGH (ref 6–20)
CALCIUM: 7.7 mg/dL — AB (ref 8.9–10.3)
CO2: 25 mmol/L (ref 22–32)
Chloride: 91 mmol/L — ABNORMAL LOW (ref 98–111)
Creatinine, Ser: 10.1 mg/dL — ABNORMAL HIGH (ref 0.61–1.24)
GFR calc Af Amer: 7 mL/min — ABNORMAL LOW (ref >60)
GFR calc non Af Amer: 6 mL/min — ABNORMAL LOW (ref >60)
Glucose, Bld: 181 mg/dL — ABNORMAL HIGH (ref 70–99)
Potassium: 3.7 mmol/L (ref 3.5–5.1)
SODIUM: 134 mmol/L — AB (ref 135–145)

## 2018-09-15 LAB — CBC
HCT: 42.1 % (ref 39.0–52.0)
Hemoglobin: 12.8 g/dL — ABNORMAL LOW (ref 13.0–17.0)
MCH: 27.7 pg (ref 26.0–34.0)
MCHC: 30.4 g/dL (ref 30.0–36.0)
MCV: 91.1 fL (ref 80.0–100.0)
Platelets: 353 10*3/uL (ref 150–400)
RBC: 4.62 MIL/uL (ref 4.22–5.81)
RDW: 16.5 % — ABNORMAL HIGH (ref 11.5–15.5)
WBC: 8.1 10*3/uL (ref 4.0–10.5)
nRBC: 0 % (ref 0.0–0.2)

## 2018-09-15 LAB — GLUCOSE, CAPILLARY
GLUCOSE-CAPILLARY: 171 mg/dL — AB (ref 70–99)
Glucose-Capillary: 149 mg/dL — ABNORMAL HIGH (ref 70–99)

## 2018-09-15 MED ORDER — OXYCODONE-ACETAMINOPHEN 5-325 MG PO TABS: 1.0000 | ORAL_TABLET | ORAL | 0 refills | Status: DC | PRN

## 2018-09-15 MED ORDER — MORPHINE SULFATE (PF) 2 MG/ML IV SOLN
INTRAVENOUS | Status: AC
  Filled 2018-09-15: qty 1

## 2018-09-15 MED ORDER — CHLORHEXIDINE GLUCONATE CLOTH 2 % EX PADS: 6.0000 | MEDICATED_PAD | Freq: Every day | CUTANEOUS | Status: DC

## 2018-09-15 MED ORDER — MORPHINE SULFATE (PF) 2 MG/ML IV SOLN
INTRAVENOUS | Status: AC
  Administered 2018-09-15: 2 mg via INTRAVENOUS
  Filled 2018-09-15: qty 1

## 2018-09-15 NOTE — Discharge Instructions (Signed)
Toe Amputation, Care After Refer to this sheet in the next few weeks. These instructions provide you with information on caring for yourself after your procedure. Your health care provider may also give you more specific instructions. Your treatment has been planned according to current medical practices, but problems sometimes occur. Call your health care provider if you have any problems or questions after your procedure. What can I expect after the procedure? After your procedure, it is common to have some pain. Pain usually improves within a week. Follow these instructions at home: Medicines  Take your antibiotic medicine as told by your health care provider. Do not stop taking the antibiotic even if you start to feel better.  Take over-the-counter and prescription medicines only as told by your health care provider. Managing pain, stiffness, and swelling   If directed, apply ice to the surgical area: ? Put ice in a plastic bag. ? Place a towel between your skin and the bag. ? Leave the ice on for 20 minutes, 2-3 times a day.  Raise (elevate) your foot so it is above the level of your heart. This helps to reduce swelling.  Try to walk each day. Incision care      Follow instructions from your health care provider about how to take care of your cut from surgery (incision). Make sure you: ? Wash your hands with soap and water before you change your bandage (dressing). If soap and water are not available, use hand sanitizer. ? Change your dressing as told by your health care provider. ? If you got stitches (sutures), leave them in place. They may need to stay in place for 2 weeks or longer.  Check your incision area every day for signs of infection. Check for: ? More redness, swelling, or pain. ? More fluid or blood. ? Warmth. ? Pus or a bad smell. Bathing  Do not take baths, swim, use a hot tub, or soak your foot until your health care provider approves.  You may shower  unless you were told not to. When you shower, keep your dressing dry.  If your dressing has been removed, you may wash your skin with warm water and soap. Driving  Do not drive for 24 hours if you received a sedative.  Do not drive or operate heavy machinery while taking prescription pain medicine. Activity  Do exercises as told by your health care provider.  Return to your normal activities as told by your health care provider. Ask your health care provider what activities are safe for you. General instructions  Do not use any tobacco products, such as cigarettes, chewing tobacco, and e-cigarettes. If you need help quitting, ask your health care provider.  Ask your health care provider about wearing special shoes or using inserts to support your foot.  If you have diabetes, keep your blood sugar under control.  Keep all follow-up visits as told by your health care provider. This is important. Contact a health care provider if:  You have more redness around your incision.  You have more fluid or blood coming from your incision.  Your incision feels warm to the touch.  You have pus or a bad smell coming from your incision.  You have a fever.  Your dressing is soaked with blood.  Your sutures tear or they separate.  You have numbness or tingling in your toes or foot.  Your foot is cool or pale, or it changes color.  Your pain does not improve after you  take your medicine. Get help right away if:  You have pain or swelling that gets worse or does not go away.  You have red streaks on your skin near your toes, foot, or leg.  You have pain in your calf or behind your knee.  You have shortness of breath.  You have chest pain. This information is not intended to replace advice given to you by your health care provider. Make sure you discuss any questions you have with your health care provider. Document Released: 07/14/2015 Document Revised: 04/21/2018 Document  Reviewed: 04/26/2015 Elsevier Interactive Patient Education  2019 [employer].

## 2018-09-15 NOTE — Evaluation (Signed)
Occupational Therapy Evaluation Patient Details Name: [name]. MRN: [id number] DOB: 1976/07/20 Today's Date: 09/15/2018    History of Present Illness Patient is a 43 y/o male s/p right MT amputation. PMH includes left BKA, s/p arteriogram and arthrectomy RLE resulting in 4 compartment fasciotomy 12/23, CABG, ESRD on HD, HTN, DM.   Clinical Impression   Pt is a 43 yo male s/p above dx. Pt PTA: pt performing ADLs with Modified independent and IADLs provided for him by health aid through [facility], mostly using w/c for mobility. Pt currently, performing stand pivot transfer with RLE prosthetic with minA using Rw for stability. Pt sitting EOB for LB dressing with Modified independence. Pt unable to perform any balance task in standing. Pt would benefit from continued OT skilled services for ADL, mobility and safety in [place] setting.    Follow Up Recommendations  Home health OT;Supervision - Intermittent    Equipment Recommendations  None recommended by OT    Recommendations for Other Services       Precautions / Restrictions Precautions Precautions: Fall Required Braces or Orthoses: Other Brace Other Brace: darco shoe Restrictions Weight Bearing Restrictions: Yes RLE Weight Bearing: Weight bearing as tolerated Other Position/Activity Restrictions: thru heel on darco shoe      Mobility Bed Mobility Overal bed mobility: Modified Independent Bed Mobility: Supine to Sit     Supine to sit: HOB elevated;Modified independent (Device/Increase time)     General bed mobility comments: No assist needed.   Transfers Overall transfer level: Needs assistance Equipment used: Rolling walker (2 wheeled) Transfers: Sit to/from Stand Sit to Stand: Min assist;From elevated surface         General transfer comment: Min A to steady in standing, slow to rise. Difficulty feeling weight through right heel, tightness in right calf.    Balance Overall balance assessment: Needs  assistance Sitting-balance support: No upper extremity supported Sitting balance-Leahy Scale: Good Sitting balance - Comments: Able to reach outside Bos and donn darco shoe and left prosthesis   Standing balance support: During functional activity Standing balance-Leahy Scale: Poor Standing balance comment: Reliant on BUEs for support in standing.                            ADL either performed or assessed with clinical judgement   ADL Overall ADL's : At baseline                                       General ADL Comments: Increased pain and time required. pt was sink bathing and has all AE and DME for showering. Pt with [facility] aid 5 hours M-Fr     Vision Baseline Vision/History: No visual deficits Vision Assessment?: No apparent visual deficits     Perception     Praxis      Pertinent Vitals/Pain Pain Assessment: Faces Pain Score: 8  Faces Pain Scale: Hurts little more Pain Location: right foot at amputation site Pain Descriptors / Indicators: Sore;Operative site guarding Pain Intervention(s): Limited activity within patient's tolerance     Hand Dominance Right   Extremity/Trunk Assessment Upper Extremity Assessment Upper Extremity Assessment: Overall WFL for tasks assessed   Lower Extremity Assessment Lower Extremity Assessment: Generalized weakness;RLE deficits/detail;LLE deficits/detail RLE Deficits / Details: Able to lift RLE against gravity without difficulty RLE Sensation: decreased light touch LLE Deficits / Details: left BKA  Cervical / Trunk Assessment Cervical / Trunk Assessment: Normal   Communication Communication Communication: No difficulties   Cognition Arousal/Alertness: Awake/alert Behavior During Therapy: WFL for tasks assessed/performed Overall Cognitive Status: Within Functional Limits for tasks assessed                                     General Comments       Exercises     Shoulder  Instructions      Home Living Family/patient expects to be discharged to:: Private residence Living Arrangements: Alone Available Help at Discharge: Personal care attendant Type of Home: Apartment Home Access: Level entry     Home Layout: One level     Bathroom Shower/Tub: Teacher, early years/pre: Handicapped height     Home Equipment: Grab bars - toilet;Grab bars - tub/shower;Wheelchair - [employer] - 2 wheels;Crutches;Tub bench;Cane - single point          Prior Functioning/Environment Level of Independence: Needs assistance  Gait / Transfers Assistance Needed: Not doing much walking since December, uses w/c for mobility.  ADL's / Homemaking Assistance Needed: Does own ADLs. IADLs provided for him   Comments: Aide comes in 5 days/week, 5 hrs per day. Does cooking/cleaning.         OT Problem List: Decreased strength;Decreased activity tolerance;Impaired balance (sitting and/or standing);Decreased safety awareness;Pain      OT Treatment/Interventions: Therapeutic exercise;Self-care/ADL training;Therapeutic activities;Balance training;DME and/or AE instruction;Energy conservation;Neuromuscular education    OT Goals(Current goals can be found in the care plan section) Acute Rehab OT Goals Patient Stated Goal: to go home OT Goal Formulation: With patient Time For Goal Achievement: 09/29/18 Potential to Achieve Goals: Good ADL Goals Pt Will Transfer to Toilet: with modified independence;bedside commode Pt/caregiver will Perform Home Exercise Program: Both right and left upper extremity;Independently Additional ADL Goal #1: Pt will perform ADL functional transfers and ADL functional mobility with modified independence.  OT Frequency: Min 2X/week   Barriers to D/C:            Co-evaluation   Reason for Co-Treatment: To address functional/ADL transfers PT goals addressed during session: Mobility/safety with mobility;Balance        AM-PAC OT "6  Clicks" Daily Activity     Outcome Measure Help from another person eating meals?: None Help from another person taking care of personal grooming?: None Help from another person toileting, which includes using toliet, bedpan, or urinal?: A Little Help from another person bathing (including washing, rinsing, drying)?: A Little Help from another person to put on and taking off regular upper body clothing?: None Help from another person to put on and taking off regular lower body clothing?: A Little 6 Click Score: 21   End of Session Equipment Utilized During Treatment: Gait belt;Rolling walker Nurse Communication: Mobility status  Activity Tolerance: Patient limited by pain;Patient tolerated treatment well Patient left: in chair;with call bell/phone within reach;with chair alarm set  OT Visit Diagnosis: Unsteadiness on feet (R26.81);Muscle weakness (generalized) (M62.81)                Time: 0263-7858 OT Time Calculation (min): 26 min Charges:  OT General Charges $OT Visit: 1 Visit OT Evaluation $OT Eval Moderate Complexity: 1 Mod  Darryl Nestle) Marsa Aris OTR/L Acute Rehabilitation Services Pager: [phone] Office: [phone]   Fredda Hammed 09/15/2018, 1:30 PM

## 2018-09-15 NOTE — Consult Note (Signed)
[facility] Renal Consultation Note    Indication for Consultation:  Management of ESRD/hemodialysis, anemia, hypertension/volume, and secondary hyperparathyroidism. PCP:  HPI: [name] Sellitto Sr. is a 43 y.o. male with ESRD, HTN, CAD (s/p CABG 10/2017), Type 2 DM, and PAD (s/p L BKA and R 5th amp) who was admitted for planned R TMA procedure.  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 Had f/u appt with [facility] on 1/28 where it was found that 5th toe amp site was not healing, plan was discussed for TMA with hope to save his leg. Procedure completed by Dr. Carlis Abbott on 1/30 without immediate complications.    Today, c/o mild R foot pain. No CP, dyspnea, fever, chills, N/V, or other complaints.  Dialyzes on MWF schedule at [facility]. Last HD 1/29 which he completed in entirety. He is due for dialysis today.  Past Medical History:  Diagnosis Date  . Anemia   . Atherosclerosis of lower extremity ([place])   . Blood clot in vein    right calf  . CAD (coronary artery disease)   . Cataracts, bilateral   . Chronic combined systolic and diastolic heart failure ([place])   . Complication of anesthesia   . Depression   . ESRD (end stage renal disease) on dialysis [facility])    "MWF [employer]" (03/08/2017)  . GERD (gastroesophageal reflux disease)   . Heart murmur   . History of blood transfusion    "related to OR"  . Hypertension   . Myocardial infarction ([place])     " light"  . Nonhealing surgical wound    nonviable tissue  . PONV (postoperative nausea and vomiting)   . S/P unilateral BKA (below knee amputation), left ([place])   . Type II diabetes mellitus ([place])   . Wears glasses    Past Surgical History:  Procedure Laterality Date  . ABDOMINAL AORTOGRAM N/A 11/02/2016   Procedure: Abdominal Aortogram;  Surgeon: Waynetta Sandy, MD;  Location: [place] [facility];  Service: Cardiovascular;   Laterality: N/A;  . ABDOMINAL AORTOGRAM W/LOWER EXTREMITY Right 08/07/2018   Procedure: ABDOMINAL AORTOGRAM W/LOWER EXTREMITY;  Surgeon: [name], MD;  Location: [place] CV LAB;  Service: Cardiovascular;  Laterality: Right;  . AMPUTATION Left 09/27/2013   Procedure: LEFT GREAT TOE AMPUTATION;  Surgeon: [name], MD;  Location: [place];  Service: Orthopedics;  Laterality: Left;  . AMPUTATION Right 08/15/2015   Procedure: Right Great Toe Amputation;  Surgeon: [name], MD;  Location: [place];  Service: Orthopedics;  Laterality: Right;  . AMPUTATION Left 11/05/2016   Procedure: TRANSMETATARSAL AMPUTATION LEFT FOOT;  Surgeon: [name], MD;  Location: [place];  Service: Orthopedics;  Laterality: Left;  . AMPUTATION Left 03/11/2017   Procedure: LEFT BELOW KNEE AMPUTATION;  Surgeon: [name], MD;  Location: [place];  Service: Orthopedics;  Laterality: Left;  . AMPUTATION Right 03/11/2017   Procedure: RIGHT 2ND TOE AMPUTATION;  Surgeon: [name], MD;  Location: [place];  Service: Orthopedics;  Laterality: Right;  . AV FISTULA PLACEMENT  left arm  . CORONARY ARTERY BYPASS GRAFT N/A 11/04/2017   Procedure: CORONARY ARTERY BYPASS GRAFTING (CABG) x three, using left internal mammary artery and right    leg greater saphenous vein;  Surgeon: Melrose Nakayama, MD;  Location: [place];  Service: Open Heart Surgery;  Laterality: N/A;  . FASCIOTOMY Right 08/07/2018   Procedure: FOUR  COMPARTMENT FASCIOTOMY OF RIGHT LOWER LEG;  Surgeon: Rosetta Posner, MD;  Location: [place];  Service: Vascular;  Laterality: Right;  . FASCIOTOMY CLOSURE Right 08/10/2018   Procedure: FASCIOTOMY CLOSURE RIGHT LOWER EXTREMITY;  Surgeon: [name], MD;  Location: [place];  Service: Vascular;  Laterality: Right;  . HEMATOMA EVACUATION Right 08/07/2018   Procedure: EVACUATION HEMATOMA;  Surgeon: Rosetta Posner, MD;  Location: [place];  Service: Vascular;  Laterality: Right;  . LEFT HEART CATH AND  CORONARY ANGIOGRAPHY N/A 10/31/2017   Procedure: LEFT HEART CATH AND CORONARY ANGIOGRAPHY;  Surgeon: Troy Sine, MD;  Location: [place] CV [facility];  Service: Cardiovascular;  Laterality: N/A;  . LEFT HEART CATHETERIZATION WITH CORONARY ANGIOGRAM N/A 09/13/2014   Procedure: LEFT HEART CATHETERIZATION WITH CORONARY ANGIOGRAM;  Surgeon: Sinclair Grooms, MD;  Location: [facility] CATH LAB;  Service: Cardiovascular;  Laterality: N/A;  . LOWER EXTREMITY ANGIOGRAPHY Bilateral 11/02/2016   Procedure: Lower Extremity Angiography;  Surgeon: Waynetta Sandy, MD;  Location: [place] [facility] LAB;  Service: Cardiovascular;  Laterality: Bilateral;  . PERIPHERAL VASCULAR ATHERECTOMY Left 11/02/2016   Procedure: Peripheral Vascular Atherectomy;  Surgeon: Waynetta Sandy, MD;  Location: [place] CV LAB;  Service: Cardiovascular;  Laterality: Left;  PERONEAL  . PERIPHERAL VASCULAR ATHERECTOMY Right 08/07/2018   Procedure: PERIPHERAL VASCULAR ATHERECTOMY;  Surgeon: [name], MD;  Location: [place] CV LAB;  Service: Cardiovascular;  Laterality: Right;  Peroneal artery  . PERIPHERAL VASCULAR BALLOON ANGIOPLASTY Right 08/07/2018   Procedure: PERIPHERAL VASCULAR BALLOON ANGIOPLASTY;  Surgeon: [name], MD;  Location: [place] CV LAB;  Service: Cardiovascular;  Laterality: Right;  anterior tibial artery  . STUMP REVISION Left 01/11/2017   Procedure: Revision Left Transmetatarsal Amputation;  Surgeon: [name], MD;  Location: [place];  Service: Orthopedics;  Laterality: Left;  . TEE WITHOUT CARDIOVERSION N/A 11/04/2017   Procedure: TRANSESOPHAGEAL ECHOCARDIOGRAM (TEE);  Surgeon: Melrose Nakayama, MD;  Location: [place];  Service: Open Heart Surgery;  Laterality: N/A;  . TRANSMETATARSAL AMPUTATION Right 08/10/2018   Procedure: RIGHT FIFTH TOE AMPUTATION;  Surgeon: [name], MD;  Location: [facility] OR;  Service: Vascular;  Laterality: Right;   Family History  Problem  Relation Age of Onset  . Heart failure Mother   . Hypertension Mother    Social History:  reports that he quit smoking about 4 weeks ago. His smoking use included cigarettes. He has a 13.00 pack-year smoking history. He has never used smokeless tobacco. He reports previous alcohol use. He reports current drug use. Drug: Marijuana.  ROS: As per HPI otherwise negative.  Physical Exam: Vitals:   09/14/18 2149 09/14/18 2349 09/15/18 0612 09/15/18 0615  BP: (!) 150/66 (!) 164/44 139/66   Pulse:      Resp: 14 18 18    Temp:  98.6 F (37 C) 98.6 F (37 C)   TempSrc:  Oral Oral   SpO2:  96% 98%   Weight:    80.1 kg  Height:         General: Well developed, well nourished, in no acute distress. Head: Normocephalic, atraumatic, sclera non-icteric, mucus membranes are moist. Neck: Supple without lymphadenopathy/masses. JVD not elevated. Lungs: Clear bilaterally to auscultation without wheezes, rales, or rhonchi. Breathing is unlabored. Heart: RRR with normal S1, S2. No murmurs, rubs, or gallops appreciated. Abdomen: Soft, non-tender, non-distended with normoactive bowel sounds. No rebound/guarding. No obvious abdominal masses. Musculoskeletal:  Strength and tone appear normal for age. Lower  extremities: Prior L BKA without stump edema. R foot bandaged with blood seeping through (notified RN). No edema. Neuro: Alert and oriented X 3. Moves all extremities spontaneously. Psych:  Responds to questions appropriately with a normal affect. Dialysis Access: L AVF + thrill  Allergies  Allergen Reactions  . Coconut Oil Anaphylaxis and Other (See Comments)    Can use topically, allergic to coconut foods    Prior to Admission medications   Medication Sig Start Date End Date Taking? Authorizing Provider  aspirin EC 81 MG tablet Take 1 tablet (81 mg total) by mouth daily. 02/07/18  Yes Burtis Junes, NP  atorvastatin (LIPITOR) 80 MG tablet Take 1 tablet (80 mg total) by mouth daily at 6 PM.  08/17/18  Yes Kayleen Memos, DO  calcitRIOL (ROCALTROL) 0.25 MCG capsule Take 5 capsules (1.25 mcg total) by mouth every Monday, Wednesday, and Friday with hemodialysis. 11/09/17  Yes Conte, Tessa N, PA-C  carvedilol (COREG) 6.25 MG tablet Take 6.25 mg by mouth daily.   Yes [provider]  clopidogrel (PLAVIX) 75 MG tablet Take 1 tablet (75 mg total) by mouth daily. 02/07/18  Yes Burtis Junes, NP  glucagon (GLUCAGON EMERGENCY) 1 MG injection Inject 1 mg into the vein once as needed (low blood sugar and inability to eat or drink sugar). 08/16/15  Yes Short, Noah Delaine, MD  insulin glargine (LANTUS) 100 UNIT/ML injection Inject 0.1 mLs (10 Units total) into the skin at bedtime. Patient taking differently: Inject 14 Units into the skin at bedtime.  09/08/18  Yes Geradine Girt, DO  lamoTRIgine (LAMICTAL) 150 MG tablet Take 150 mg by mouth at bedtime.   Yes [provider]  lanthanum (FOSRENOL) 1000 MG chewable tablet Chew 2 tablets (2,000 mg total) by mouth 3 (three) times daily with meals. 08/16/15  Yes Short, Noah Delaine, MD  multivitamin (RENA-VIT) TABS tablet Take 1 tablet by mouth at bedtime. 08/17/18  Yes Hall, Carole N, DO  sertraline (ZOLOFT) 50 MG tablet Take 50 mg by mouth daily.    Yes [provider]  acetaminophen (TYLENOL) 325 MG tablet Take 2 tablets (650 mg total) by mouth every 4 (four) hours as needed for headache or mild pain. Patient not taking: Reported on 09/13/2018 09/08/18   Geradine Girt, DO  Amino Acids-Protein Hydrolys (FEEDING SUPPLEMENT, PRO-STAT SUGAR FREE 64,) LIQD Take 30 mLs by mouth 2 (two) times daily. Patient not taking: Reported on 09/13/2018 08/17/18   Kayleen Memos, DO  isosorbide mononitrate (IMDUR) 30 MG 24 hr tablet Take 1 tablet (30 mg total) by mouth daily. 09/08/18   Geradine Girt, DO  ondansetron (ZOFRAN) 4 MG tablet Take 4 mg by mouth every 6 (six) hours as needed for nausea or vomiting.    [provider]   Current  Facility-Administered Medications  Medication Dose Route Frequency Provider Last Rate Last Dose  . 0.9 %  sodium chloride infusion   Intravenous Continuous Dagoberto Ligas, PA-C   Stopped at 09/14/18 1700  . acetaminophen (TYLENOL) tablet 325-650 mg  325-650 mg Oral Q4H PRN Dagoberto Ligas, PA-C       Or  . acetaminophen (TYLENOL) suppository 325-650 mg  325-650 mg Rectal Q4H PRN Dagoberto Ligas, PA-C      . aspirin EC tablet 81 mg  81 mg Oral Daily Dagoberto Ligas, PA-C      . atorvastatin (LIPITOR) tablet 80 mg  80 mg Oral q1800 Dagoberto Ligas, PA-C   80 mg at 09/14/18 1739  .  carvedilol (COREG) tablet 6.25 mg  6.25 mg Oral Daily Dagoberto Ligas, PA-C   6.25 mg at 09/14/18 1739  . Chlorhexidine Gluconate Cloth 2 % PADS 6 each  6 each Topical Q0600 Loren Racer, PA-C      . clopidogrel (PLAVIX) tablet 75 mg  75 mg Oral Daily Eveland, Matthew, PA-C      . docusate sodium (COLACE) capsule 100 mg  100 mg Oral Daily Eveland, Matthew, PA-C      . guaiFENesin-dextromethorphan (ROBITUSSIN DM) 100-10 MG/5ML syrup 15 mL  15 mL Oral Q4H PRN Dagoberto Ligas, PA-C      . heparin injection 5,000 Units  5,000 Units Subcutaneous Q8H Dagoberto Ligas, PA-C   5,000 Units at 09/15/18 8338  . hydrALAZINE (APRESOLINE) injection 5 mg  5 mg Intravenous Q20 Min PRN Dagoberto Ligas, PA-C      . insulin aspart (novoLOG) injection 0-15 Units  0-15 Units Subcutaneous TID WC Dagoberto Ligas, PA-C   2 Units at 09/15/18 [phone]  . insulin glargine (LANTUS) injection 14 Units  14 Units Subcutaneous QHS Dagoberto Ligas, PA-C   14 Units at 09/14/18 2145  . isosorbide mononitrate (IMDUR) 24 hr tablet 30 mg  30 mg Oral Daily Dagoberto Ligas, PA-C   30 mg at 09/14/18 1739  . labetalol (NORMODYNE,TRANDATE) injection 10 mg  10 mg Intravenous Q10 min PRN Dagoberto Ligas, PA-C   10 mg at 09/14/18 2118  . lamoTRIgine (LAMICTAL) tablet 150 mg  150 mg Oral QHS Dagoberto Ligas, PA-C   150 mg at 09/14/18 2105  . magnesium  sulfate IVPB 2 g 50 mL  2 g Intravenous Daily PRN Dagoberto Ligas, PA-C      . metoprolol tartrate (LOPRESSOR) injection 2-5 mg  2-5 mg Intravenous Q2H PRN Dagoberto Ligas, PA-C      . morphine 2 MG/ML injection 2 mg  2 mg Intravenous Q2H PRN Dagoberto Ligas, PA-C   2 mg at 09/15/18 0420  . ondansetron (ZOFRAN) injection 4 mg  4 mg Intravenous Q6H PRN Dagoberto Ligas, PA-C      . ondansetron (ZOFRAN) tablet 4 mg  4 mg Oral Q6H PRN Dagoberto Ligas, PA-C      . oxyCODONE-acetaminophen (PERCOCET/ROXICET) 5-325 MG per tablet 1-2 tablet  1-2 tablet Oral Q4H PRN Dagoberto Ligas, PA-C   2 tablet at 09/15/18 0645  . pantoprazole (PROTONIX) EC tablet 40 mg  40 mg Oral Daily Dagoberto Ligas, PA-C   40 mg at 09/14/18 1739  . phenol (CHLORASEPTIC) mouth spray 1 spray  1 spray Mouth/Throat PRN Dagoberto Ligas, PA-C      . potassium chloride SA (K-DUR,KLOR-CON) CR tablet 20-40 mEq  20-40 mEq Oral Daily PRN Dagoberto Ligas, PA-C      . sertraline (ZOLOFT) tablet 50 mg  50 mg Oral Daily Dagoberto Ligas, PA-C   50 mg at 09/14/18 1739   Labs: Basic Metabolic Panel: Recent Labs  Lab 09/14/18 1101 09/15/18 0538  NA 137 134*  K 3.6 3.7  CL  --  91*  CO2  --  25  GLUCOSE 135* 181*  BUN  --  47*  CREATININE  --  10.10*  CALCIUM  --  7.7*   CBC: Recent Labs  Lab 09/14/18 1101 09/15/18 0538  WBC  --  8.1  HGB 16.7 12.8*  HCT 49.0 42.1  MCV  --  91.1  PLT  --  353   CBG: Recent Labs  Lab 09/14/18 1259 09/14/18 1524 09/14/18 1758 09/14/18 2114 09/15/18 0637  GLUCAP 105*  162* 122* 248* 149*   Dialysis Orders:  MWF at [facility] 4hr, 200dialyzer, 500/800, EDW 80kg, 2K/2Ca, UF profile 2, no heparin - Calcitriol 1.57mcg PO q HD - ESA on hold for Hgb > 12  Assessment/Plan: 1.  Non-healing R 5th amputation site (s/p R TMA 1/30): Per [facility], no heparin with HD. 2.  ESRD: Will continue HD per MWF schedule, for HD today. 3.  Hypertension/volume: BP stable for now, minimal edema on exam. UF as  tolerated. 4.  Anemia: Hgb > 12. No need for ESA at this time. 5.  Metabolic bone disease: Ca slightly low, alb pending. Continue VDRA for now. 6.  Nutrition: Alb pending. 7.    CAD (Hx CABG) 8.    Type 2 DM: Insulin per primary. 9.     PAD  Veneta Penton, PA-C 09/15/2018, 9:09 AM  [facility] Pager: [phone]

## 2018-09-15 NOTE — Progress Notes (Signed)
[facility]  Subjective  - Pain in R TMA.  Requested IV morphine x 3 last night.   Objective 139/66 91 98.6 F (37 C) (Oral) 18 98%  Intake/Output Summary (Last 24 hours) at 09/15/2018 0949 Last data filed at 09/15/2018 0900 Gross per 24 hour  Intake 1080 ml  Output 75 ml  Net 1005 ml    Right TMA c/d/i with staples   Laboratory Lab Results: Recent Labs    09/14/18 1101 09/15/18 0538  WBC  --  8.1  HGB 16.7 12.8*  HCT 49.0 42.1  PLT  --  353   BMET Recent Labs    09/14/18 1101 09/15/18 0538  NA 137 134*  K 3.6 3.7  CL  --  91*  CO2  --  25  GLUCOSE 135* 181*  BUN  --  47*  CREATININE  --  10.10*  CALCIUM  --  7.7*    COAG Lab Results  Component Value Date   INR 1.25 11/04/2017   INR 1.00 10/30/2017   INR 1.05 09/13/2014   No results found for: PTT  Assessment/Planning:  Doing well POD#1 s/p R TMA for CLI with tissue loss.  Will consult nephrology today for dialysis. Plan for d/c this pm vs tomorrow pending pain control. Percocet adjusted.  Patient wants to go home and has wheelchair and wheelchair accessible apartment.  Will need short order follow-up in clinic.   [name] 09/15/2018 9:49 AM --

## 2018-09-15 NOTE — Consult Note (Signed)
Physical Medicine and Rehabilitation Consult   Reason for Consult: Right transmetatarsal amputation in patient with L-BKA Referring Physician: Dr. Carlis Abbott.    HPI: [name] Caul Sr. is a 43 y.o. male with history of ESRD- HD MWF, CAD s/p CABG, T2DM, PAD s/p L-BKA and right fifth toe amputation and complicated hospitalization 07/2018 for attempts at limb salvage. He was readmitted on 09/14/18 with poorly wound healing and underwent right transmetatarsal amputation by Dr. Carlis Abbott on 09/14/18.  PT/OT evaluations pending. To limit weight to left heel?  CIR recommended by MD due to anticipation of extensive rehab needs.    Review of Systems  Constitutional: Negative for chills and fever.  HENT: Negative for hearing loss and tinnitus.   Eyes: Negative for blurred vision and double vision.  Respiratory: Negative for cough and shortness of breath.   Cardiovascular: Positive for chest pain (occasionally).  Gastrointestinal: Negative for heartburn and nausea.  Musculoskeletal: Positive for back pain and myalgias.  Skin: Negative for itching and rash.  Neurological: Negative for dizziness and headaches.  Psychiatric/Behavioral: The patient does not have insomnia.       Past Medical History:  Diagnosis Date  . Anemia   . Atherosclerosis of lower extremity ([place])   . Blood clot in vein    right calf  . CAD (coronary artery disease)   . Cataracts, bilateral   . Chronic combined systolic and diastolic heart failure ([place])   . Complication of anesthesia   . Depression   . ESRD (end stage renal disease) on dialysis [facility])    "MWF [employer]" (03/08/2017)  . GERD (gastroesophageal reflux disease)   . Heart murmur   . History of blood transfusion    "related to OR"  . Hypertension   . Myocardial infarction ([place])     " light"  . Nonhealing surgical wound    nonviable tissue  . PONV (postoperative nausea and vomiting)   . S/P unilateral BKA (below knee amputation), left ([place])     . Type II diabetes mellitus ([place])   . Wears glasses     Past Surgical History:  Procedure Laterality Date  . ABDOMINAL AORTOGRAM N/A 11/02/2016   Procedure: Abdominal Aortogram;  Surgeon: Waynetta Sandy, MD;  Location: [place] [facility];  Service: Cardiovascular;  Laterality: N/A;  . ABDOMINAL AORTOGRAM W/LOWER EXTREMITY Right 08/07/2018   Procedure: ABDOMINAL AORTOGRAM W/LOWER EXTREMITY;  Surgeon: [name], MD;  Location: [place] [facility];  Service: Cardiovascular;  Laterality: Right;  . AMPUTATION Left 09/27/2013   Procedure: LEFT GREAT TOE AMPUTATION;  Surgeon: [name], MD;  Location: [place];  Service: Orthopedics;  Laterality: Left;  . AMPUTATION Right 08/15/2015   Procedure: Right Great Toe Amputation;  Surgeon: [name], MD;  Location: [place];  Service: Orthopedics;  Laterality: Right;  . AMPUTATION Left 11/05/2016   Procedure: TRANSMETATARSAL AMPUTATION LEFT FOOT;  Surgeon: [name], MD;  Location: [place];  Service: Orthopedics;  Laterality: Left;  . AMPUTATION Left 03/11/2017   Procedure: LEFT BELOW KNEE AMPUTATION;  Surgeon: [name], MD;  Location: [place];  Service: Orthopedics;  Laterality: Left;  . AMPUTATION Right 03/11/2017   Procedure: RIGHT 2ND TOE AMPUTATION;  Surgeon: [name], MD;  Location: [place];  Service: Orthopedics;  Laterality: Right;  . AV FISTULA PLACEMENT  left arm  . CORONARY ARTERY BYPASS GRAFT N/A 11/04/2017   Procedure: CORONARY ARTERY BYPASS GRAFTING (CABG) x three, using left internal  mammary artery and right    leg greater saphenous vein;  Surgeon: Melrose Nakayama, MD;  Location: [place];  Service: Open Heart Surgery;  Laterality: N/A;  . FASCIOTOMY Right 08/07/2018   Procedure: FOUR COMPARTMENT FASCIOTOMY OF RIGHT LOWER LEG;  Surgeon: Rosetta Posner, MD;  Location: [place];  Service: Vascular;  Laterality: Right;  . FASCIOTOMY CLOSURE Right 08/10/2018   Procedure: FASCIOTOMY CLOSURE RIGHT LOWER EXTREMITY;   Surgeon: [name], MD;  Location: [place];  Service: Vascular;  Laterality: Right;  . HEMATOMA EVACUATION Right 08/07/2018   Procedure: EVACUATION HEMATOMA;  Surgeon: Rosetta Posner, MD;  Location: [place];  Service: Vascular;  Laterality: Right;  . LEFT HEART CATH AND CORONARY ANGIOGRAPHY N/A 10/31/2017   Procedure: LEFT HEART CATH AND CORONARY ANGIOGRAPHY;  Surgeon: Troy Sine, MD;  Location: [place] [facility];  Service: Cardiovascular;  Laterality: N/A;  . LEFT HEART CATHETERIZATION WITH CORONARY ANGIOGRAM N/A 09/13/2014   Procedure: LEFT HEART CATHETERIZATION WITH CORONARY ANGIOGRAM;  Surgeon: Sinclair Grooms, MD;  Location: [facility] CATH LAB;  Service: Cardiovascular;  Laterality: N/A;  . LOWER EXTREMITY ANGIOGRAPHY Bilateral 11/02/2016   Procedure: Lower Extremity Angiography;  Surgeon: Waynetta Sandy, MD;  Location: [place] [facility];  Service: Cardiovascular;  Laterality: Bilateral;  . PERIPHERAL VASCULAR ATHERECTOMY Left 11/02/2016   Procedure: Peripheral Vascular Atherectomy;  Surgeon: Waynetta Sandy, MD;  Location: [place] [facility];  Service: Cardiovascular;  Laterality: Left;  PERONEAL  . PERIPHERAL VASCULAR ATHERECTOMY Right 08/07/2018   Procedure: PERIPHERAL VASCULAR ATHERECTOMY;  Surgeon: [name], MD;  Location: [place] [facility];  Service: Cardiovascular;  Laterality: Right;  Peroneal artery  . PERIPHERAL VASCULAR BALLOON ANGIOPLASTY Right 08/07/2018   Procedure: PERIPHERAL VASCULAR BALLOON ANGIOPLASTY;  Surgeon: [name], MD;  Location: [place] [facility];  Service: Cardiovascular;  Laterality: Right;  anterior tibial artery  . STUMP REVISION Left 01/11/2017   Procedure: Revision Left Transmetatarsal Amputation;  Surgeon: [name], MD;  Location: [place];  Service: Orthopedics;  Laterality: Left;  . TEE WITHOUT CARDIOVERSION N/A 11/04/2017   Procedure: TRANSESOPHAGEAL ECHOCARDIOGRAM (TEE);  Surgeon: Melrose Nakayama, MD;   Location: [place];  Service: Open Heart Surgery;  Laterality: N/A;  . TRANSMETATARSAL AMPUTATION Right 08/10/2018   Procedure: RIGHT FIFTH TOE AMPUTATION;  Surgeon: [name], MD;  Location: [facility] OR;  Service: Vascular;  Laterality: Right;    Family History  Problem Relation Age of Onset  . Heart failure Mother   . Hypertension Mother     Social History:  Lives alone --has an aide 5 hours/5 days a week. Takes SCAT to HD.  Has been wheelchair bound past 3 weeks except for transfer to prevent WB on RLE. Per reports that he quit smoking about 4 weeks ago. His smoking use included cigarettes. He has a 13.00 pack-year smoking history. He has never used smokeless tobacco. He reports previous alcohol use. He reports current drug use. Drug: Marijuana.   Allergies  Allergen Reactions  . Coconut Oil Anaphylaxis and Other (See Comments)    Can use topically, allergic to coconut foods     Medications Prior to Admission  Medication Sig Dispense Refill  . aspirin EC 81 MG tablet Take 1 tablet (81 mg total) by mouth daily. 30 tablet 11  . atorvastatin (LIPITOR) 80 MG tablet Take 1 tablet (80 mg total) by mouth daily at 6 PM. 30 tablet 0  . calcitRIOL (ROCALTROL) 0.25 MCG  capsule Take 5 capsules (1.25 mcg total) by mouth every Monday, Wednesday, and Friday with hemodialysis. 30 capsule 1  . carvedilol (COREG) 6.25 MG tablet Take 6.25 mg by mouth daily.    . clopidogrel (PLAVIX) 75 MG tablet Take 1 tablet (75 mg total) by mouth daily. 30 tablet 6  . glucagon (GLUCAGON EMERGENCY) 1 MG injection Inject 1 mg into the vein once as needed (low blood sugar and inability to eat or drink sugar). 1 each 0  . insulin glargine (LANTUS) 100 UNIT/ML injection Inject 0.1 mLs (10 Units total) into the skin at bedtime. (Patient taking differently: Inject 14 Units into the skin at bedtime. ) 10 mL 11  . lamoTRIgine (LAMICTAL) 150 MG tablet Take 150 mg by mouth at bedtime.    Marland Kitchen lanthanum (FOSRENOL) 1000 MG  chewable tablet Chew 2 tablets (2,000 mg total) by mouth 3 (three) times daily with meals. 180 tablet 0  . multivitamin (RENA-VIT) TABS tablet Take 1 tablet by mouth at bedtime. 30 tablet 0  . sertraline (ZOLOFT) 50 MG tablet Take 50 mg by mouth daily.     Marland Kitchen acetaminophen (TYLENOL) 325 MG tablet Take 2 tablets (650 mg total) by mouth every 4 (four) hours as needed for headache or mild pain. (Patient not taking: Reported on 09/13/2018)    . Amino Acids-Protein Hydrolys (FEEDING SUPPLEMENT, PRO-STAT SUGAR FREE 64,) LIQD Take 30 mLs by mouth 2 (two) times daily. (Patient not taking: Reported on 09/13/2018) 7 Bottle 0  . isosorbide mononitrate (IMDUR) 30 MG 24 hr tablet Take 1 tablet (30 mg total) by mouth daily. 30 tablet 0  . ondansetron (ZOFRAN) 4 MG tablet Take 4 mg by mouth every 6 (six) hours as needed for nausea or vomiting.      Home: Home Living Family/patient expects to be discharged to:: Private residence Living Arrangements: Spouse/significant other  Functional History:   Functional Status:  Mobility:          ADL:    Cognition: Cognition Orientation Level: Oriented X4    Blood pressure 139/66, pulse 91, temperature 98.6 F (37 C), temperature source Oral, resp. rate 18, height 6\' 2"  (1.88 m), weight 80.1 kg, SpO2 98 %. Physical Exam  Nursing note and vitals reviewed. Constitutional: He is oriented to person, place, and time. He appears well-developed and well-nourished.  HENT:  Head: Normocephalic.  Eyes: Pupils are equal, round, and reactive to light.  Neck: Normal range of motion.  Cardiovascular: Normal rate.  Respiratory: Effort normal.  GI: Soft.  Musculoskeletal:        General: Edema present.     Comments: Left BKA site well healed. Right transmet site with dry dressing. Bloody drainage noted on sheets.   Neurological: He is alert and oriented to person, place, and time. No cranial nerve deficit. Coordination normal.  UE 5/5. LLE 5/5. RLE not tested d/t  pain  Skin: Skin is warm.  Psychiatric:  Flat but cooperative    Results for orders placed or performed during the hospital encounter of 09/14/18 (from the past 24 hour(s))  Glucose, capillary     Status: Abnormal   Collection Time: 09/14/18 10:46 AM  Result Value Ref Range   Glucose-Capillary 125 (H) 70 - 99 mg/dL   Comment 1 Notify RN    Comment 2 Document in Chart   I-STAT 4, (NA,K, GLUC, HGB,HCT)     Status: Abnormal   Collection Time: 09/14/18 11:01 AM  Result Value Ref Range   Sodium 137 135 -  145 mmol/L   Potassium 3.6 3.5 - 5.1 mmol/L   Glucose, Bld 135 (H) 70 - 99 mg/dL   HCT 49.0 39.0 - 52.0 %   Hemoglobin 16.7 13.0 - 17.0 g/dL  Glucose, capillary     Status: Abnormal   Collection Time: 09/14/18 12:59 PM  Result Value Ref Range   Glucose-Capillary 105 (H) 70 - 99 mg/dL  Glucose, capillary     Status: Abnormal   Collection Time: 09/14/18  3:24 PM  Result Value Ref Range   Glucose-Capillary 162 (H) 70 - 99 mg/dL  Glucose, capillary     Status: Abnormal   Collection Time: 09/14/18  5:58 PM  Result Value Ref Range   Glucose-Capillary 122 (H) 70 - 99 mg/dL  Glucose, capillary     Status: Abnormal   Collection Time: 09/14/18  9:14 PM  Result Value Ref Range   Glucose-Capillary 248 (H) 70 - 99 mg/dL  Basic metabolic panel     Status: Abnormal   Collection Time: 09/15/18  5:38 AM  Result Value Ref Range   Sodium 134 (L) 135 - 145 mmol/L   Potassium 3.7 3.5 - 5.1 mmol/L   Chloride 91 (L) 98 - 111 mmol/L   CO2 25 22 - 32 mmol/L   Glucose, Bld 181 (H) 70 - 99 mg/dL   BUN 47 (H) 6 - 20 mg/dL   Creatinine, Ser 10.10 (H) 0.61 - 1.24 mg/dL   Calcium 7.7 (L) 8.9 - 10.3 mg/dL   GFR calc non Af Amer 6 (L) >60 mL/min   GFR calc Af Amer 7 (L) >60 mL/min   Anion gap 18 (H) 5 - 15  CBC     Status: Abnormal   Collection Time: 09/15/18  5:38 AM  Result Value Ref Range   WBC 8.1 4.0 - 10.5 K/uL   RBC 4.62 4.22 - 5.81 MIL/uL   Hemoglobin 12.8 (L) 13.0 - 17.0 g/dL   HCT 42.1  39.0 - 52.0 %   MCV 91.1 80.0 - 100.0 fL   MCH 27.7 26.0 - 34.0 pg   MCHC 30.4 30.0 - 36.0 g/dL   RDW 16.5 (H) 11.5 - 15.5 %   Platelets 353 150 - 400 K/uL   nRBC 0.0 0.0 - 0.2 %  Glucose, capillary     Status: Abnormal   Collection Time: 09/15/18  6:37 AM  Result Value Ref Range   Glucose-Capillary 149 (H) 70 - 99 mg/dL   No results found.   Assessment/Plan: Diagnosis: New right TMA 09/14/2018, hx of left BKA (with prosthesis) 1. Does the need for close, 24 hr/day medical supervision in concert with the patient's rehab needs make it unreasonable for this patient to be served in a less intensive setting? Yes 2. Co-Morbidities requiring supervision/potential complications: wound care, pain mgt, ESRD, CAD, DM,  3. Due to bladder management, bowel management, safety, skin/wound care, disease management, medication administration, pain management and patient education, does the patient require 24 hr/day rehab nursing? Yes and Potentially 4. Does the patient require coordinated care of a physician, rehab nurse, PT (1-2 hrs/day, 5 days/week) and OT (1-2 hrs/day, 5 days/week) to address physical and functional deficits in the context of the above medical diagnosis(es)? Yes and Potentially Addressing deficits in the following areas: balance, endurance, locomotion, strength, transferring, bowel/bladder control, bathing, dressing, feeding, grooming, toileting and psychosocial support 5. Can the patient actively participate in an intensive therapy program of at least 3 hrs of therapy per day at least 5 days per week?  Yes 6. The potential for patient to make measurable gains while on inpatient rehab is excellent 7. Anticipated functional outcomes upon discharge from inpatient rehab are modified independent  with PT, modified independent with OT, n/a with SLP. 8. Estimated rehab length of stay to reach the above functional goals is: 7 days 9. Anticipated D/C setting: Home 10. Anticipated post D/C  treatments: HH therapy and Outpatient therapy 11. Overall Rehab/Functional Prognosis: good  RECOMMENDATIONS: This patient's condition is appropriate for continued rehabilitative care in the following setting: CIR Patient has agreed to participate in recommended program. Potentially Note that insurance prior authorization may be required for reimbursement for recommended care.  Comment: Pt will need to learn new transfer techniques including the use of his left, prosthetic leg as a weight bearing pivot.  Rehab Admissions Coordinator to follow up.  Thanks,  Meredith Staggers, MD, Mellody Drown  I have personally performed a face to face diagnostic evaluation of this patient. Additionally, I have reviewed and concur with the physician assistant's documentation above.    Bary Leriche, PA-C 09/15/2018

## 2018-09-15 NOTE — Care Management Note (Signed)
Case Management Note Marvetta Gibbons RN, BSN Transitions of Care Unit 4E- RN Case Manager [phone]  Patient Details  Name: [name] Sens Sr. MRN: [id number] Date of Birth: 1976-05-10  Subjective/Objective:    Pt admitted s/p TMA                Action/Plan: PTA pt lived at home, has all needed DME including w/c. CIR consulted however pt wants to return home. Orders have been placed for HHRN/PT- have spoken with Tiffany with [facility] regarding vascular referral- they will accept pt and f/u on referral. Per CMS guidelines from medicare.gov website with star ratings (copy placed in shadow chart) list provided to pt.   Expected Discharge Date:  09/15/18               Expected Discharge Plan:  [place]  In-House Referral:     Discharge planning Services  CM Consult  Post Acute Care Choice:  Home Health Choice offered to:     DME Arranged:    DME Agency:     HH Arranged:  RN, PT HH Agency:  [facility]  Status of Service:  Completed, signed off  If discussed at [place] of Stay Meetings, dates discussed:    Discharge Disposition: home/home health   Additional Comments:  [name], RN 09/15/2018, 4:44 PM

## 2018-09-15 NOTE — Progress Notes (Signed)
Patient D.C. via [place] with belongings with [employer] [employer] and orin. With no complaints. All instructions were gone over and given to patient by [facility]. on day shift. [employer] taxi service to pick up patient at the [employer].

## 2018-09-15 NOTE — Progress Notes (Signed)
Inpatient Rehabilitation-Admissions Coordinator   [facility] met with pt and discussed CIR program, including expectations, estimated length of stay, and anticipated functional level at DC. Pt has declined CIR offer and wants to return home. He feels he has enough support to manage at home. AC has notified CM, Kristi regarding pt wishes.   AC will sign off.   Please call if questions.   Jhonnie Garner, OTR/L  Rehab Admissions Coordinator  [phone] 09/15/2018 12:08 PM

## 2018-09-15 NOTE — Evaluation (Signed)
Physical Therapy Evaluation Patient Details Name: [name] Crocket Sr. MRN: [id number] DOB: 1976/03/24 Today's Date: 09/15/2018   History of Present Illness  Patient is a 43 y/o male s/p right MT amputation. PMH includes left BKA, s/p arteriogram and arthrectomy RLE resulting in 4 compartment fasciotomy 12/23, CABG, ESRD on HD, HTN, DM.  Clinical Impression  Patient presents with pain, generalized weakness, impaired sensation and impaired mobility s/p above. Tolerated standing and taking a few steps to get to chair with Min A for balance/safety. Pt lives alone and has an aid 5 hrs/day for 5 days/week who assists with IADLs. Pt does his own ADLs and reports being mostly w/c level since surgery in December. Able to donn prosthesis and darco shoe independently. Education re: elevation of RLE, darco shoe, WB status, mobility at home, safety etc. Will follow acutely to maximize independence and mobility prior to return home.     Follow Up Recommendations Home health PT;Supervision - Intermittent    Equipment Recommendations  None recommended by PT    Recommendations for Other Services       Precautions / Restrictions Precautions Precautions: Fall Required Braces or Orthoses: Other Brace Other Brace: darco shoe Restrictions Weight Bearing Restrictions: Yes RLE Weight Bearing: Weight bearing as tolerated Other Position/Activity Restrictions: thru heel on darco shoe      Mobility  Bed Mobility Overal bed mobility: Modified Independent Bed Mobility: Supine to Sit     Supine to sit: HOB elevated;Modified independent (Device/Increase time)     General bed mobility comments: No assist needed.   Transfers Overall transfer level: Needs assistance Equipment used: Rolling walker (2 wheeled) Transfers: Sit to/from Stand Sit to Stand: Min assist;From elevated surface         General transfer comment: Min A to steady in standing, slow to rise. Difficulty feeling weight through  right heel, tightness in right calf.  Ambulation/Gait Ambulation/Gait assistance: Min guard Gait Distance (Feet): 2 Feet Assistive device: Rolling walker (2 wheeled)   Gait velocity: decreased    General Gait Details: Able to take a few steps to get to chair with increased WB through [place].   Stairs            Wheelchair Mobility    Modified Rankin (Stroke Patients Only)       Balance Overall balance assessment: Needs assistance Sitting-balance support: No upper extremity supported(foot supported) Sitting balance-Leahy Scale: Good Sitting balance - Comments: Able to reach outside Bos and donn darco shoe and left prosthesis   Standing balance support: During functional activity Standing balance-Leahy Scale: Poor Standing balance comment: Reliant on BUEs for support in standing.                              Pertinent Vitals/Pain Pain Assessment: 0-10 Pain Score: 8  Pain Location: right foot at amputation site Pain Descriptors / Indicators: Sore;Operative site guarding Pain Intervention(s): Monitored during session;Premedicated before session;Limited activity within patient's tolerance    Home Living Family/patient expects to be discharged to:: Private residence Living Arrangements: Alone Available Help at Discharge: Personal care attendant;Available PRN/intermittently Type of Home: Apartment Home Access: Level entry     Home Layout: One level Home Equipment: Grab bars - toilet;Grab bars - tub/shower;Wheelchair - [employer] - 2 wheels;Crutches;Tub bench;Cane - single point      Prior Function Level of Independence: Needs assistance   Gait / Transfers Assistance Needed: Not doing much walking since December, uses w/c for mobility.  ADL's / Homemaking Assistance Needed: Does own ADls.  Comments: Aide comes in 5 days/week, 5 hrs per day. Does cooking/cleaning.      Hand Dominance        Extremity/Trunk Assessment   Upper Extremity  Assessment Upper Extremity Assessment: Defer to OT evaluation    Lower Extremity Assessment Lower Extremity Assessment: RLE deficits/detail;LLE deficits/detail RLE Deficits / Details: Able to lift RLE against gravity without difficulty RLE Sensation: decreased light touch LLE Deficits / Details: left BKA       Communication   Communication: No difficulties  Cognition Arousal/Alertness: Awake/alert Behavior During Therapy: WFL for tasks assessed/performed Overall Cognitive Status: Within Functional Limits for tasks assessed                                        General Comments      Exercises     Assessment/Plan    PT Assessment Patient needs continued PT services  PT Problem List Decreased mobility;Pain;Impaired sensation;Decreased balance;Decreased skin integrity       PT Treatment Interventions Functional mobility training;Balance training;Patient/family education;Gait training;Therapeutic activities;Wheelchair mobility training;Therapeutic exercise    PT Goals (Current goals can be found in the Care Plan section)  Acute Rehab PT Goals Patient Stated Goal: to go home PT Goal Formulation: With patient Time For Goal Achievement: 09/29/18 Potential to Achieve Goals: Good    Frequency Min 3X/week   Barriers to discharge Decreased caregiver support home alone but has an aide 5 hrs/day    Co-evaluation PT/OT/SLP Co-Evaluation/Treatment: Yes Reason for Co-Treatment: To address functional/ADL transfers PT goals addressed during session: Mobility/safety with mobility;Balance         AM-PAC PT "6 Clicks" Mobility  Outcome Measure Help needed turning from your back to your side while in a flat bed without using bedrails?: None Help needed moving from lying on your back to sitting on the side of a flat bed without using bedrails?: None Help needed moving to and from a bed to a chair (including a wheelchair)?: A Little Help needed standing up from a  chair using your arms (e.g., wheelchair or bedside chair)?: A Little Help needed to walk in hospital room?: A Little Help needed climbing 3-5 steps with a railing? : A Lot 6 Click Score: 19    End of Session Equipment Utilized During Treatment: Gait belt Activity Tolerance: Patient tolerated treatment well;Patient limited by pain Patient left: in chair;with call bell/phone within reach;with chair alarm set Nurse Communication: Mobility status PT Visit Diagnosis: Pain;Difficulty in walking, not elsewhere classified (R26.2) Pain - Right/Left: Right Pain - part of body: Ankle and joints of foot    Time: 1045-1110 PT Time Calculation (min) (ACUTE ONLY): 25 min   Charges:   PT Evaluation $PT Eval Moderate Complexity: 1 Mod          Wray Kearns, PT, DPT Acute Rehabilitation Services Pager [phone] Office [place] 09/15/2018, 12:13 PM

## 2018-09-19 NOTE — Discharge Summary (Signed)
[facility] Discharge Summary   Patient ID:  [name] Kail Sr. MRN: [id number] DOB/AGE: Apr 09, 1976 43 y.o.  Admit date: 09/14/2018 Discharge date: 09/15/2018 Date of Surgery: 09/14/2018 Surgeon: Surgeon(s): [name], MD  Admission Diagnosis: nonviable tissue right foot  Discharge Diagnoses:  nonviable tissue right foot  Secondary Diagnoses: Past Medical History:  Diagnosis Date  . Anemia   . Atherosclerosis of lower extremity ([place])   . Blood clot in vein    right calf  . CAD (coronary artery disease)   . Cataracts, bilateral   . Chronic combined systolic and diastolic heart failure ([place])   . Complication of anesthesia   . Depression   . ESRD (end stage renal disease) on dialysis [facility])    "MWF [employer]" (03/08/2017)  . GERD (gastroesophageal reflux disease)   . Heart murmur   . History of blood transfusion    "related to OR"  . Hypertension   . Myocardial infarction ([place])     " light"  . Nonhealing surgical wound    nonviable tissue  . PONV (postoperative nausea and vomiting)   . S/P unilateral BKA (below knee amputation), left ([place])   . Type II diabetes mellitus ([place])   . Wears glasses     Procedure(s): TRANSMETATARSAL AMPUTATION  Discharged Condition: stable  HPI: 43 year old male with critical limb ischemia the right lower extremity with tissue loss. He previously underwent right TP trunk and peroneal atherectomy as well as a right fifth toe amp and required fasciotomies for popliteal space hematoma. Ultimately his right fifth toe amp has failed to heal and now the wound is opened.  He is scheduled for right TMA.   Hospital Course:  [name] Zarazua Sr. is a 43 y.o. male is S/P Right Procedure(s): TRANSMETATARSAL AMPUTATION  He was seen in consult with Nephrology.  After HD he was discharged home. [facility] set up and he  wheelchair and wheelchair accessible apartment.     Significant Diagnostic Studies: CBC Lab  Results  Component Value Date   WBC 8.1 09/15/2018   HGB 12.8 (L) 09/15/2018   HCT 42.1 09/15/2018   MCV 91.1 09/15/2018   PLT 353 09/15/2018    BMET    Component Value Date/Time   NA 134 (L) 09/15/2018 0538   NA 133 (L) 02/07/2018 0937   K 3.7 09/15/2018 0538   CL 91 (L) 09/15/2018 0538   CO2 25 09/15/2018 0538   GLUCOSE 181 (H) 09/15/2018 0538   GLUCOSE 262 03/06/2008   BUN 47 (H) 09/15/2018 0538   BUN 37 (H) 02/07/2018 0937   CREATININE 10.10 (H) 09/15/2018 0538   CALCIUM 7.7 (L) 09/15/2018 0538   GFRNONAA 6 (L) 09/15/2018 0538   GFRAA 7 (L) 09/15/2018 0538   COAG Lab Results  Component Value Date   INR 1.25 11/04/2017   INR 1.00 10/30/2017   INR 1.05 09/13/2014     Disposition:  Discharge to :Home Discharge Instructions    Call MD for:  redness, tenderness, or signs of infection (pain, swelling, bleeding, redness, odor or green/yellow discharge around incision site)   Complete by:  As directed    Call MD for:  severe or increased pain, loss or decreased feeling  in affected limb(s)   Complete by:  As directed    Call MD for:  temperature >100.5   Complete by:  As directed    Resume previous diet   Complete by:  As directed      Allergies as  of 09/15/2018      Reactions   Coconut Oil Anaphylaxis, Other (See Comments)   Can use topically, allergic to coconut foods       Medication List    TAKE these medications   acetaminophen 325 MG tablet Commonly known as:  TYLENOL Take 2 tablets (650 mg total) by mouth every 4 (four) hours as needed for headache or mild pain.   aspirin EC 81 MG tablet Take 1 tablet (81 mg total) by mouth daily.   atorvastatin 80 MG tablet Commonly known as:  LIPITOR Take 1 tablet (80 mg total) by mouth daily at 6 PM.   calcitRIOL 0.25 MCG capsule Commonly known as:  ROCALTROL Take 5 capsules (1.25 mcg total) by mouth every Monday, Wednesday, and Friday with hemodialysis.   carvedilol 6.25 MG tablet Commonly known as:   COREG Take 6.25 mg by mouth daily.   clopidogrel 75 MG tablet Commonly known as:  PLAVIX Take 1 tablet (75 mg total) by mouth daily.   feeding supplement (PRO-STAT SUGAR FREE 64) Liqd Take 30 mLs by mouth 2 (two) times daily.   glucagon 1 MG injection Commonly known as:  GLUCAGON EMERGENCY Inject 1 mg into the vein once as needed (low blood sugar and inability to eat or drink sugar).   insulin glargine 100 UNIT/ML injection Commonly known as:  LANTUS Inject 0.1 mLs (10 Units total) into the skin at bedtime. What changed:  how much to take   isosorbide mononitrate 30 MG 24 hr tablet Commonly known as:  IMDUR Take 1 tablet (30 mg total) by mouth daily.   lamoTRIgine 150 MG tablet Commonly known as:  LAMICTAL Take 150 mg by mouth at bedtime.   lanthanum 1000 MG chewable tablet Commonly known as:  FOSRENOL Chew 2 tablets (2,000 mg total) by mouth 3 (three) times daily with meals.   multivitamin Tabs tablet Take 1 tablet by mouth at bedtime.   ondansetron 4 MG tablet Commonly known as:  ZOFRAN Take 4 mg by mouth every 6 (six) hours as needed for nausea or vomiting.   oxyCODONE-acetaminophen 5-325 MG tablet Commonly known as:  PERCOCET/ROXICET Take 1 tablet by mouth every 4 (four) hours as needed for moderate pain.   sertraline 50 MG tablet Commonly known as:  ZOLOFT Take 50 mg by mouth daily.      Verbal and written Discharge instructions given to the patient. Wound care per Discharge AVS Follow-up Information    [name], MD Follow up in 2 week(s).   Specialty:  Vascular Surgery Contact information: [address] [place] [address] [place], [facility] Follow up.   Specialty:  [place] Why:  HHRN/PT arranged- they will call you to set up home visits Contact information: [place]  [id number] [phone]           Signed: Roxy Horseman 09/19/2018, 5:47 PM

## 2018-10-04 ENCOUNTER — Other Ambulatory Visit: Payer: Self-pay

## 2018-10-04 ENCOUNTER — Emergency Department (HOSPITAL_COMMUNITY)
Admission: EM | Admit: 2018-10-04 | Discharge: 2018-10-04 | Disposition: A | Discharge status: Home / Self Care | Payer: Medicaid Other | Attending: Emergency Medicine | Admitting: Emergency Medicine

## 2018-10-04 ENCOUNTER — Emergency Department (HOSPITAL_COMMUNITY): Payer: Medicaid Other

## 2018-10-04 ENCOUNTER — Encounter (HOSPITAL_COMMUNITY): Payer: Self-pay | Admitting: Emergency Medicine

## 2018-10-04 DIAGNOSIS — I2510 Atherosclerotic heart disease of native coronary artery without angina pectoris: Secondary | ICD-10-CM | POA: Insufficient documentation

## 2018-10-04 DIAGNOSIS — Z87891 Personal history of nicotine dependence: Secondary | ICD-10-CM | POA: Diagnosis not present

## 2018-10-04 DIAGNOSIS — Z951 Presence of aortocoronary bypass graft: Secondary | ICD-10-CM | POA: Diagnosis not present

## 2018-10-04 DIAGNOSIS — M79671 Pain in right foot: Secondary | ICD-10-CM | POA: Diagnosis present

## 2018-10-04 DIAGNOSIS — I5040 Unspecified combined systolic (congestive) and diastolic (congestive) heart failure: Secondary | ICD-10-CM | POA: Diagnosis not present

## 2018-10-04 DIAGNOSIS — Z7902 Long term (current) use of antithrombotics/antiplatelets: Secondary | ICD-10-CM | POA: Diagnosis not present

## 2018-10-04 DIAGNOSIS — Z79899 Other long term (current) drug therapy: Secondary | ICD-10-CM | POA: Insufficient documentation

## 2018-10-04 DIAGNOSIS — I252 Old myocardial infarction: Secondary | ICD-10-CM | POA: Insufficient documentation

## 2018-10-04 DIAGNOSIS — Z7982 Long term (current) use of aspirin: Secondary | ICD-10-CM | POA: Diagnosis not present

## 2018-10-04 DIAGNOSIS — I132 Hypertensive heart and chronic kidney disease with heart failure and with stage 5 chronic kidney disease, or end stage renal disease: Secondary | ICD-10-CM | POA: Diagnosis not present

## 2018-10-04 DIAGNOSIS — N185 Chronic kidney disease, stage 5: Secondary | ICD-10-CM | POA: Insufficient documentation

## 2018-10-04 DIAGNOSIS — E1022 Type 1 diabetes mellitus with diabetic chronic kidney disease: Secondary | ICD-10-CM | POA: Diagnosis not present

## 2018-10-04 LAB — BASIC METABOLIC PANEL
Anion gap: 16 — ABNORMAL HIGH (ref 5–15)
BUN: 38 mg/dL — ABNORMAL HIGH (ref 6–20)
CO2: 26 mmol/L (ref 22–32)
Calcium: 6.4 mg/dL — CL (ref 8.9–10.3)
Chloride: 92 mmol/L — ABNORMAL LOW (ref 98–111)
Creatinine, Ser: 10.32 mg/dL — ABNORMAL HIGH (ref 0.61–1.24)
GFR calc Af Amer: 6 mL/min — ABNORMAL LOW (ref >60)
GFR, EST NON AFRICAN AMERICAN: 6 mL/min — AB (ref >60)
Glucose, Bld: 153 mg/dL — ABNORMAL HIGH (ref 70–99)
Potassium: 3.9 mmol/L (ref 3.5–5.1)
Sodium: 134 mmol/L — ABNORMAL LOW (ref 135–145)

## 2018-10-04 LAB — CBC WITH DIFFERENTIAL/PLATELET
Abs Immature Granulocytes: 0.02 10*3/uL (ref 0.00–0.07)
Basophils Absolute: 0 10*3/uL (ref 0.0–0.1)
Basophils Relative: 0 %
Eosinophils Absolute: 0.1 10*3/uL (ref 0.0–0.5)
Eosinophils Relative: 1 %
HCT: 41.9 % (ref 39.0–52.0)
Hemoglobin: 13 g/dL (ref 13.0–17.0)
Immature Granulocytes: 0 %
Lymphocytes Relative: 18 %
Lymphs Abs: 1.3 10*3/uL (ref 0.7–4.0)
MCH: 26.2 pg (ref 26.0–34.0)
MCHC: 31 g/dL (ref 30.0–36.0)
MCV: 84.3 fL (ref 80.0–100.0)
Monocytes Absolute: 0.6 10*3/uL (ref 0.1–1.0)
Monocytes Relative: 9 %
Neutro Abs: 5.2 10*3/uL (ref 1.7–7.7)
Neutrophils Relative %: 72 %
PLATELETS: 579 10*3/uL — AB (ref 150–400)
RBC: 4.97 MIL/uL (ref 4.22–5.81)
RDW: 16.7 % — AB (ref 11.5–15.5)
WBC: 7.2 10*3/uL (ref 4.0–10.5)
nRBC: 0 % (ref 0.0–0.2)

## 2018-10-04 LAB — CBG MONITORING, ED: Glucose-Capillary: 195 mg/dL — ABNORMAL HIGH (ref 70–99)

## 2018-10-04 MED ORDER — OXYCODONE-ACETAMINOPHEN 5-325 MG PO TABS
1.0000 | ORAL_TABLET | Freq: Once | ORAL | Status: AC
  Administered 2018-10-04: 1 via ORAL
  Filled 2018-10-04: qty 1

## 2018-10-04 NOTE — ED Notes (Signed)
Patient transported to X-ray 

## 2018-10-04 NOTE — ED Provider Notes (Addendum)
[facility] EMERGENCY DEPARTMENT Provider Note   CSN: [id number] Arrival date & time: 10/04/18  1322    History   Chief Complaint Chief Complaint  Patient presents with  . Foot Pain    HPI [name] Aguayo Sr. is a 43 y.o. male.     43 year old male with prior medical history as detailed below presents for evaluation of pain at the right foot.  Patient reports recent transmetatarsal right foot amputation.  This was performed by Dr. Carlis Abbott last month.  Patient complains of continued pain to the area of the amputation.  He denies fever or drainage from the wound.  He reports that he had been using Percocet for pain.  He has been out of Percocet for the last week.  Reports that he has a follow-up appointment with vascular next week for removal of staples.  Of note, patient reports that his last dialysis session was earlier today.  His session was approximately 3 and half hours.  He denies any problems related to dialysis.  The history is provided by the patient and medical records.  Foot Pain  This is a recurrent problem. The current episode started more than 2 days ago. The problem occurs constantly. The problem has not changed since onset.Pertinent negatives include no chest pain, no abdominal pain, no headaches and no shortness of breath. Nothing aggravates the symptoms. Nothing relieves the symptoms.    Past Medical History:  Diagnosis Date  . Anemia   . Atherosclerosis of lower extremity ([place])   . Blood clot in vein    right calf  . CAD (coronary artery disease)   . Cataracts, bilateral   . Chronic combined systolic and diastolic heart failure ([place])   . Complication of anesthesia   . Depression   . ESRD (end stage renal disease) on dialysis [facility])    "MWF [employer]" (03/08/2017)  . GERD (gastroesophageal reflux disease)   . Heart murmur   . History of blood transfusion    "related to OR"  . Hypertension   . Myocardial infarction ([place])     "  light"  . Nonhealing surgical wound    nonviable tissue  . PONV (postoperative nausea and vomiting)   . S/P unilateral BKA (below knee amputation), left ([place])   . Type II diabetes mellitus ([place])   . Wears glasses     Patient Active Problem List   Diagnosis Date Noted  . Non-healing surgical wound 09/14/2018  . Chest pain, rule out acute myocardial infarction 09/07/2018  . Gangrene of toe of left foot ([place]) 08/05/2018  . 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[place]) 08/05/2018  . Gangrene of foot ([place]) 08/05/2018  . 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 ACS (acute coronary syndrome) ([place]) 10/28/2017  . Flash pulmonary edema ([place]) 06/04/2017  . Hypertensive emergency 06/04/2017  . 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 S/P BKA (below knee amputation) unilateral, left ([place])   . Post-operative pain   . Acute blood loss anemia   . Chronic combined systolic and diastolic CHF (congestive heart failure) ([place])   . PVD (peripheral vascular disease) ([place])   . Coronary artery disease involving native coronary artery of native heart with unstable angina pectoris ([place])   . Tobacco abuse   . Diabetic foot infection ([place]) 03/08/2017  . Osteomyelitis ([place]) 03/08/2017  . Wound dehiscence   .  Dehiscence of amputation stump ([place]) 01/04/2017  . Gangrene of right foot ([place]) 08/02/2016  . Achilles tendon contracture, left 08/02/2016  . Anemia of renal disease 08/16/2015  . Wound infection 08/15/2015  . Diabetic ulcer of right great toe ([place]) 08/15/2015  . Pain in the chest   . Elevated troponin   . Essential hypertension   . ESRD on dialysis ([place]) 08/07/2013  . Chest pain 05/16/2012  . Murmur 05/16/2012  . Renal disorder     Past Surgical History:  Procedure Laterality Date  . ABDOMINAL AORTOGRAM N/A 11/02/2016   Procedure: Abdominal Aortogram;  Surgeon:  Waynetta Sandy, MD;  Location: [facility] CV [facility];  Service: Cardiovascular;  Laterality: N/A;  . ABDOMINAL AORTOGRAM W/LOWER EXTREMITY Right 08/07/2018   Procedure: ABDOMINAL AORTOGRAM W/LOWER EXTREMITY;  Surgeon: [name], MD;  Location: [facility];  Service: Cardiovascular;  Laterality: Right;  . AMPUTATION Left 09/27/2013   Procedure: LEFT GREAT TOE AMPUTATION;  Surgeon: [name], MD;  Location: [facility];  Service: Orthopedics;  Laterality: Left;  . AMPUTATION Right 08/15/2015   Procedure: Right Great Toe Amputation;  Surgeon: [name], MD;  Location: [facility];  Service: Orthopedics;  Laterality: Right;  . AMPUTATION Left 11/05/2016   Procedure: TRANSMETATARSAL AMPUTATION LEFT FOOT;  Surgeon: [name], MD;  Location: [facility];  Service: Orthopedics;  Laterality: Left;  . AMPUTATION Left 03/11/2017   Procedure: LEFT BELOW KNEE AMPUTATION;  Surgeon: [name], MD;  Location: [facility];  Service: Orthopedics;  Laterality: Left;  . AMPUTATION Right 03/11/2017   Procedure: RIGHT 2ND TOE AMPUTATION;  Surgeon: [name], MD;  Location: [facility];  Service: Orthopedics;  Laterality: Right;  . AV FISTULA PLACEMENT  left arm  . CORONARY ARTERY BYPASS GRAFT N/A 11/04/2017   Procedure: CORONARY ARTERY BYPASS GRAFTING (CABG) x three, using left internal mammary artery and right    leg greater saphenous vein;  Surgeon: Melrose Nakayama, MD;  Location: [facility];  Service: Open Heart Surgery;  Laterality: N/A;  . FASCIOTOMY Right 08/07/2018   Procedure: FOUR COMPARTMENT FASCIOTOMY OF RIGHT LOWER LEG;  Surgeon: Rosetta Posner, MD;  Location: [facility];  Service: Vascular;  Laterality: Right;  . FASCIOTOMY CLOSURE Right 08/10/2018   Procedure: FASCIOTOMY CLOSURE RIGHT LOWER EXTREMITY;  Surgeon: [name], MD;  Location: [facility];  Service: Vascular;  Laterality: Right;  . HEMATOMA EVACUATION Right 08/07/2018   Procedure: EVACUATION HEMATOMA;  Surgeon: Rosetta Posner, MD;   Location: [facility];  Service: Vascular;  Laterality: Right;  . LEFT HEART CATH AND CORONARY ANGIOGRAPHY N/A 10/31/2017   Procedure: LEFT HEART CATH AND CORONARY ANGIOGRAPHY;  Surgeon: Troy Sine, MD;  Location: [facility];  Service: Cardiovascular;  Laterality: N/A;  . LEFT HEART CATHETERIZATION WITH CORONARY ANGIOGRAM N/A 09/13/2014   Procedure: LEFT HEART CATHETERIZATION WITH CORONARY ANGIOGRAM;  Surgeon: Sinclair Grooms, MD;  Location: [facility] C[facility] LAB;  Service: Cardiovascular;  Laterality: N/A;  . LOWER EXTREMITY ANGIOGRAPHY Bilateral 11/02/2016   Procedure: Lower Extremity Angiography;  Surgeon: Waynetta Sandy, MD;  Location: [facility] CV LAB;  Service: Cardiovascular;  Laterality: Bilateral;  . PERIPHERAL VASCULAR ATHERECTOMY Left 11/02/2016   Procedure: Peripheral Vascular Atherectomy;  Surgeon: Waynetta Sandy, MD;  Location: [facility];  Service: Cardiovascular;  Laterality: Left;  PERONEAL  . PERIPHERAL VASCULAR ATHERECTOMY Right 08/07/2018   Procedure: PERIPHERAL VASCULAR ATHERECTOMY;  Surgeon: [name], MD;  Location:  [facility] INVASIVE CV LAB;  Service: Cardiovascular;  Laterality: Right;  Peroneal artery  . PERIPHERAL VASCULAR BALLOON ANGIOPLASTY Right 08/07/2018   Procedure: PERIPHERAL VASCULAR BALLOON ANGIOPLASTY;  Surgeon: [name], MD;  Location: [facility];  Service: Cardiovascular;  Laterality: Right;  anterior tibial artery  . STUMP REVISION Left 01/11/2017   Procedure: Revision Left Transmetatarsal Amputation;  Surgeon: [name], MD;  Location: [employer];  Service: Orthopedics;  Laterality: Left;  . TEE WITHOUT CARDIOVERSION N/A 11/04/2017   Procedure: TRANSESOPHAGEAL ECHOCARDIOGRAM (TEE);  Surgeon: Melrose Nakayama, MD;  Location: [facility];  Service: Open Heart Surgery;  Laterality: N/A;  . TRANSMETATARSAL AMPUTATION Right 08/10/2018   Procedure: RIGHT FIFTH TOE AMPUTATION;  Surgeon: [name], MD;   Location: [facility];  Service: Vascular;  Laterality: Right;  . TRANSMETATARSAL AMPUTATION Right 09/14/2018   Procedure: TRANSMETATARSAL AMPUTATION;  Surgeon: [name], MD;  Location: [facility] [place];  Service: Vascular;  Laterality: Right;        Home Medications    Prior to Admission medications   Medication Sig Start Date End Date Taking? Authorizing Provider  acetaminophen (TYLENOL) 325 MG tablet Take 2 tablets (650 mg total) by mouth every 4 (four) hours as needed for headache or mild pain. Patient not taking: Reported on 09/13/2018 09/08/18   Geradine Girt, DO  Amino Acids-Protein Hydrolys (FEEDING SUPPLEMENT, PRO-STAT SUGAR FREE 64,) LIQD Take 30 mLs by mouth 2 (two) times daily. Patient not taking: Reported on 09/13/2018 08/17/18   Kayleen Memos, DO  aspirin EC 81 MG tablet Take 1 tablet (81 mg total) by mouth daily. 02/07/18   Burtis Junes, NP  atorvastatin (LIPITOR) 80 MG tablet Take 1 tablet (80 mg total) by mouth daily at 6 PM. 08/17/18   Kayleen Memos, DO  calcitRIOL (ROCALTROL) 0.25 MCG capsule Take 5 capsules (1.25 mcg total) by mouth every Monday, Wednesday, and Friday with hemodialysis. 11/09/17   Elgie Collard, PA-C  carvedilol (COREG) 6.25 MG tablet Take 6.25 mg by mouth daily.    [provider]  clopidogrel (PLAVIX) 75 MG tablet Take 1 tablet (75 mg total) by mouth daily. 02/07/18   Burtis Junes, NP  glucagon (GLUCAGON EMERGENCY) 1 MG injection Inject 1 mg into the vein once as needed (low blood sugar and inability to eat or drink sugar). 08/16/15   [name], MD  insulin glargine (LANTUS) 100 UNIT/ML injection Inject 0.1 mLs (10 Units total) into the skin at bedtime. Patient taking differently: Inject 14 Units into the skin at bedtime.  09/08/18   Geradine Girt, DO  isosorbide mononitrate (IMDUR) 30 MG 24 hr tablet Take 1 tablet (30 mg total) by mouth daily. 09/08/18   Geradine Girt, DO  lamoTRIgine (LAMICTAL) 150 MG tablet Take 150 mg by mouth at  bedtime.    [provider]  lanthanum (FOSRENOL) 1000 MG chewable tablet Chew 2 tablets (2,000 mg total) by mouth 3 (three) times daily with meals. 08/16/15   [name], MD  multivitamin (RENA-VIT) TABS tablet Take 1 tablet by mouth at bedtime. 08/17/18   Kayleen Memos, DO  ondansetron (ZOFRAN) 4 MG tablet Take 4 mg by mouth every 6 (six) hours as needed for nausea or vomiting.    [provider]  oxyCODONE-acetaminophen (PERCOCET/ROXICET) 5-325 MG tablet Take 1 tablet by mouth every 4 (four) hours as needed for moderate pain. 09/15/18   Ulyses Amor, PA-C  sertraline (ZOLOFT) 50 MG tablet  Take 50 mg by mouth daily.     [provider]    Family History Family History  Problem Relation Age of Onset  . Heart failure Mother   . Hypertension Mother     Social History Social History   Tobacco Use  . Smoking status: Former Smoker    Packs/day: 0.50    Years: 26.00    Pack years: 13.00    Types: Cigarettes    Last attempt to quit: 08/17/2018    Years since quitting: 0.1  . Smokeless tobacco: Never Used  Substance Use Topics  . Alcohol use: Not Currently    Comment:  "haven't took a drink in over 5 years"  . Drug use: Yes    Types: Marijuana    Comment: occasional     Allergies   Coconut oil   Review of Systems Review of Systems  Respiratory: Negative for shortness of breath.   Cardiovascular: Negative for chest pain.  Gastrointestinal: Negative for abdominal pain.  Neurological: Negative for headaches.  All other systems reviewed and are negative.    Physical Exam Updated Vital Signs BP 138/71   Pulse 79   Resp 16   Ht 6\' 2"  (1.88 m)   Wt 80 kg   SpO2 99%   BMI 22.64 kg/m   Physical Exam Vitals signs and nursing note reviewed.  Constitutional:      General: He is not in acute distress.    Appearance: He is well-developed.  HENT:     Head: Normocephalic and atraumatic.  Eyes:     Conjunctiva/sclera: Conjunctivae normal.       Pupils: Pupils are equal, round, and reactive to light.  Neck:     Musculoskeletal: Normal range of motion and neck supple.  Cardiovascular:     Rate and Rhythm: Normal rate and regular rhythm.     Heart sounds: Normal heart sounds.  Pulmonary:     Effort: Pulmonary effort is normal. No respiratory distress.     Breath sounds: Normal breath sounds.  Abdominal:     General: There is no distension.     Palpations: Abdomen is soft.     Tenderness: There is no abdominal tenderness.  Musculoskeletal: Normal range of motion.        General: No deformity.     Comments: Please see images below of the right foot wound.  Wound itself is clean dry and intact.  There is no evidence of significant infection.  Tissue is pink and appears viable surrounding the incision site.  No appreciable drainage noted.  Skin:    General: Skin is warm and dry.  Neurological:     Mental Status: He is alert and oriented to person, place, and time.          ED Treatments / Results  Labs (all labs ordered are listed, but only abnormal results are displayed) Labs Reviewed  CBC WITH DIFFERENTIAL/PLATELET - Abnormal; Notable for the following components:      Result Value   RDW 16.7 (*)    Platelets 579 (*)    All other components within normal limits  BASIC METABOLIC PANEL - Abnormal; Notable for the following components:   Sodium 134 (*)    Chloride 92 (*)    Glucose, Bld 153 (*)    BUN 38 (*)    Creatinine, Ser 10.32 (*)    Calcium 6.4 (*)    GFR calc non Af Amer 6 (*)    GFR calc Af Amer 6 (*)  Anion gap 16 (*)    All other components within normal limits  CBG MONITORING, ED - Abnormal; Notable for the following components:   Glucose-Capillary 195 (*)    All other components within normal limits    EKG None  Radiology Dg Foot Complete Right  Result Date: 10/04/2018 CLINICAL DATA:  Pain at surgical site status post amputation 3 weeks ago. EXAM: RIGHT FOOT COMPLETE - 3+ VIEW  COMPARISON:  None. FINDINGS: Skin staples overlie the amputation stump status post transmetatarsal amputation of the right foot. No soft tissue emphysema or bone destruction. The included mid and hindfoot demonstrate no acute osseous abnormality. Extensive vascular calcifications are identified crossing the ankle joint. No joint effusion. No acute fracture or evidence of osteomyelitis. IMPRESSION: No soft tissue mass or ulceration. No recurrence or persistent osteomyelitis. No fracture. Electronically Signed   By: Ashley Royalty M.D.   On: 10/04/2018 16:00    Procedures Procedures (including critical care time)  Medications Ordered in ED Medications  oxyCODONE-acetaminophen (PERCOCET/ROXICET) 5-325 MG per tablet 1 tablet (1 tablet Oral Given 10/04/18 1521)     Initial Impression / Assessment and Plan / ED Course  I have reviewed the triage vital signs and the nursing notes.  Pertinent labs & imaging results that were available during my care of the patient were reviewed by me and considered in my medical decision making (see chart for details).        MDM  Screen complete  Patient is presenting for evaluation of reported pain to the right foot status post recent amputation.  Patient without evidence of significant wound infection on evaluation or work-up.  Case discussed with Dr. Donzetta Matters of vascular.  He agrees with my impression that the wound does not require further evaluation or treatment in the ED.  Patient is advised to closely follow-up with Dr. Carlis Abbott of vascular for staple removal and further wound management.  Patient is also advised to continue to take his calcium supplementation and to follow up with his dialysis provider regarding noted hypocalcemia.   Strict return precautions given and understood.  Importance of close follow-up is stressed.   Final Clinical Impressions(s) / ED Diagnoses   Final diagnoses:  Foot pain, right    ED Discharge Orders    None         Valarie Merino, MD 10/04/18 Jeri Lager    Valarie Merino, MD 10/04/18 [phone]

## 2018-10-04 NOTE — Discharge Instructions (Addendum)
Please return for any problem.  Follow-up with your regular care providers as instructed.  Please follow-up with Dr. Monica Martinez as instructed for further wound management and removal of your staples.

## 2018-10-04 NOTE — ED Notes (Signed)
Patient verbalizes understanding of discharge instructions. Opportunity for questioning and answers were provided. Armband removed by staff, pt discharged from ED in wheelchair.  

## 2018-10-04 NOTE — ED Triage Notes (Signed)
Arrived via EMS patient had all of his right toes amputated at the end of January and had staples placed. Patient has pain currently at site denies drainage.

## 2018-10-09 ENCOUNTER — Telehealth: Payer: Self-pay | Admitting: Vascular Surgery

## 2018-10-09 NOTE — Telephone Encounter (Signed)
-----   Message from Waynetta Sandy, MD sent at 10/04/2018  6:22 PM EST ----- [name] [id number] [birth year]  Needs f/u with Dr. Jory Sims in 7-10 days for staple removal. This may be in the works already but is not reflected in epic and he supposedly has a pending court date that may complicate scheduling.   brandon

## 2018-10-09 NOTE — Telephone Encounter (Signed)
sch appt spk to pt 10/11/2018 4pm staple removal

## 2018-10-11 ENCOUNTER — Ambulatory Visit (INDEPENDENT_AMBULATORY_CARE_PROVIDER_SITE_OTHER): Payer: Medicaid Other | Admitting: Physician Assistant

## 2018-10-11 ENCOUNTER — Other Ambulatory Visit: Payer: Self-pay

## 2018-10-11 VITALS — BP 130/70 | HR 75 | Temp 97.9°F | Resp 20 | Ht 74.0 in | Wt 176.0 lb

## 2018-10-11 DIAGNOSIS — I96 Gangrene, not elsewhere classified: Secondary | ICD-10-CM

## 2018-10-11 NOTE — Progress Notes (Signed)
    Postoperative Visit    History of Present Illness   [name] Pollack Sr. is a 43 y.o. male who presents for postoperative follow-up for: right Transmetatarsal amputation by Dr. Carlis Abbott on 09/14/2018.  He had underwent TP trunk and peroneal atherectomy with a 5th toe amputation.  Toe amputation site was nonhealing thus he underwent transmetatarsal amputation.  Surgical history also significant for left BKA.  He denies any drainage, changes with his incision, rest pain, or other tissue changes.  He is taking aspirin Plavix and statin daily.  He is seen today in a wheelchair.     For VQI Use Only   PRE-ADM LIVING: Home  AMB STATUS: Ambulatory with Assistance   Physical Examination   Vitals:   10/11/18 1550  BP: 130/70  Pulse: 75  Resp: 20  Temp: 97.9 F (36.6 C)  SpO2: 95%    RLE: TMA incision healing well with staples in place, no areas of fluctuance, no drainage with manipulation, skin edges appear to be viable; right DP, PT, and peroneal signal by Doppler  Medical Decision Making   [name] Mapel Sr. is a 43 y.o. male who presents s/p right transmetatarsal amputation 4 weeks postoperative.   Right foot amp site is healing well  Staples removed in office today  Continue aspirin Plavix and statin regimen daily  Continue postoperative heel shoe as long as tolerable  Check right leg arterial duplex and ABI in 3 months  Return to office sooner with tissue changes  Dagoberto Ligas PA-C [facility] Office: [phone]  Clinic MD: Dr. Scot Dock

## 2018-10-12 ENCOUNTER — Ambulatory Visit: Payer: Medicaid Other | Admitting: Cardiology

## 2018-10-13 ENCOUNTER — Other Ambulatory Visit: Payer: Self-pay

## 2018-10-13 ENCOUNTER — Emergency Department (HOSPITAL_COMMUNITY)
Admission: EM | Admit: 2018-10-13 | Discharge: 2018-10-13 | Disposition: A | Discharge status: Home / Self Care | Payer: Medicaid Other | Attending: Emergency Medicine | Admitting: Emergency Medicine

## 2018-10-13 ENCOUNTER — Encounter (HOSPITAL_COMMUNITY): Payer: Self-pay | Admitting: Emergency Medicine

## 2018-10-13 DIAGNOSIS — I252 Old myocardial infarction: Secondary | ICD-10-CM | POA: Diagnosis not present

## 2018-10-13 DIAGNOSIS — F329 Major depressive disorder, single episode, unspecified: Secondary | ICD-10-CM | POA: Diagnosis not present

## 2018-10-13 DIAGNOSIS — E1122 Type 2 diabetes mellitus with diabetic chronic kidney disease: Secondary | ICD-10-CM | POA: Insufficient documentation

## 2018-10-13 DIAGNOSIS — I2510 Atherosclerotic heart disease of native coronary artery without angina pectoris: Secondary | ICD-10-CM | POA: Diagnosis not present

## 2018-10-13 DIAGNOSIS — Z7902 Long term (current) use of antithrombotics/antiplatelets: Secondary | ICD-10-CM | POA: Insufficient documentation

## 2018-10-13 DIAGNOSIS — Z87891 Personal history of nicotine dependence: Secondary | ICD-10-CM | POA: Diagnosis not present

## 2018-10-13 DIAGNOSIS — G8918 Other acute postprocedural pain: Secondary | ICD-10-CM | POA: Insufficient documentation

## 2018-10-13 DIAGNOSIS — I5042 Chronic combined systolic (congestive) and diastolic (congestive) heart failure: Secondary | ICD-10-CM | POA: Diagnosis not present

## 2018-10-13 DIAGNOSIS — Z7982 Long term (current) use of aspirin: Secondary | ICD-10-CM | POA: Insufficient documentation

## 2018-10-13 DIAGNOSIS — Z79899 Other long term (current) drug therapy: Secondary | ICD-10-CM | POA: Insufficient documentation

## 2018-10-13 DIAGNOSIS — I132 Hypertensive heart and chronic kidney disease with heart failure and with stage 5 chronic kidney disease, or end stage renal disease: Secondary | ICD-10-CM | POA: Diagnosis not present

## 2018-10-13 DIAGNOSIS — Z951 Presence of aortocoronary bypass graft: Secondary | ICD-10-CM | POA: Insufficient documentation

## 2018-10-13 DIAGNOSIS — L7622 Postprocedural hemorrhage and hematoma of skin and subcutaneous tissue following other procedure: Secondary | ICD-10-CM | POA: Insufficient documentation

## 2018-10-13 DIAGNOSIS — Z794 Long term (current) use of insulin: Secondary | ICD-10-CM | POA: Insufficient documentation

## 2018-10-13 DIAGNOSIS — N186 End stage renal disease: Secondary | ICD-10-CM | POA: Diagnosis not present

## 2018-10-13 MED ORDER — OXYCODONE-ACETAMINOPHEN 5-325 MG PO TABS
1.0000 | ORAL_TABLET | Freq: Once | ORAL | Status: AC
  Administered 2018-10-13: 1 via ORAL
  Filled 2018-10-13: qty 1

## 2018-10-13 NOTE — ED Triage Notes (Addendum)
Pt BIB EMS from a diner. Hx DM with recent amputation of toes from R foot. Stitches taken out Wednesday. While at [place], surgical site began to bleed spontaneously. Pt denies trauma. Hemorrhage controlled with bulky dressing from EMS. V/S WNL.

## 2018-10-13 NOTE — ED Notes (Signed)
Re-bandaged R foot with QuickClot pad, abd, kerlex, and an ace wrap. Hemorrhage controlled. Reviewed d/c instructions with pt, who verbalized understanding and had no outstanding questions. Pt departed in NAD, refused use of wheelchair.

## 2018-10-13 NOTE — ED Notes (Signed)
ED Provider at bedside. 

## 2018-10-13 NOTE — ED Provider Notes (Signed)
[facility] EMERGENCY DEPARTMENT Provider Note   CSN: [id number] Arrival date & time: 10/13/18  0301    History   Chief Complaint No chief complaint on file.   HPI [name] Bommarito Sr. is a 43 y.o. male.     Patient presents to the emergency department for evaluation of bleeding from his right foot.  Patient had transmetatarsal amputation on January 30.  He had a pulse removed this past week.  Patient reports that he was walking into a diner tonight and noticed that his foot was bleeding.  He has had some increased pain tonight as well.  He denies any direct trauma.     Past Medical History:  Diagnosis Date  . Anemia   . Atherosclerosis of lower extremity ([place])   . Blood clot in vein    right calf  . CAD (coronary artery disease)   . Cataracts, bilateral   . Chronic combined systolic and diastolic heart failure ([place])   . Complication of anesthesia   . Depression   . ESRD (end stage renal disease) on dialysis [facility])    "MWF [employer]" (03/08/2017)  . GERD (gastroesophageal reflux disease)   . Heart murmur   . History of blood transfusion    "related to OR"  . Hypertension   . Myocardial infarction ([place])     " light"  . Nonhealing surgical wound    nonviable tissue  . PONV (postoperative nausea and vomiting)   . S/P unilateral BKA (below knee amputation), left ([place])   . Type II diabetes mellitus ([place])   . Wears glasses     Patient Active Problem List   Diagnosis Date Noted  . Non-healing surgical wound 09/14/2018  . Chest pain, rule out acute myocardial infarction 09/07/2018  . Gangrene of toe of left foot ([place]) 08/05/2018  . 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 Gangrene of foot ([place]) 08/05/2018  . 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 ACS (acute coronary syndrome) ([place]) 10/28/2017  . Flash pulmonary edema ([place]) 06/04/2017  . Hypertensive emergency 06/04/2017  . 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 S/P BKA (below knee amputation) unilateral, left ([place])   . Post-operative pain   . Acute blood loss anemia   . Chronic combined systolic and diastolic CHF (congestive heart failure) ([place])   . PVD (peripheral vascular disease) ([place])   . Coronary artery disease involving native coronary artery of native heart with unstable angina pectoris ([place])   . Tobacco abuse   . Diabetic foot infection ([place]) 03/08/2017  . Osteomyelitis ([place]) 03/08/2017  . Wound dehiscence   . Dehiscence of amputation stump ([place]) 01/04/2017  . Gangrene of right foot ([place]) 08/02/2016  . Achilles tendon contracture, left 08/02/2016  . Anemia of renal disease 08/16/2015  . Wound infection 08/15/2015  . Diabetic ulcer of right great toe ([place]) 08/15/2015  . Pain in the chest   . Elevated troponin   . Essential hypertension   . ESRD on dialysis ([place]) 08/07/2013  . Chest pain 05/16/2012  . Murmur 05/16/2012  . Renal disorder     Past Surgical History:  Procedure Laterality Date  . ABDOMINAL AORTOGRAM N/A 11/02/2016   Procedure: Abdominal Aortogram;  Surgeon: Waynetta Sandy, MD;  Location: [facility];  Service: Cardiovascular;  Laterality: N/A;  . ABDOMINAL AORTOGRAM W/LOWER  EXTREMITY Right 08/07/2018   Procedure: ABDOMINAL AORTOGRAM W/LOWER EXTREMITY;  Surgeon: [name], MD;  Location: [facility];  Service: Cardiovascular;  Laterality: Right;  . AMPUTATION Left 09/27/2013   Procedure: LEFT GREAT TOE AMPUTATION;  Surgeon: [name], MD;  Location: [facility];  Service: Orthopedics;  Laterality: Left;  . AMPUTATION Right 08/15/2015   Procedure: Right Great Toe Amputation;  Surgeon: [name], MD;  Location: [facility];  Service: Orthopedics;  Laterality: Right;  . AMPUTATION Left 11/05/2016   Procedure: TRANSMETATARSAL AMPUTATION LEFT FOOT;  Surgeon: [name],  MD;  Location: [facility];  Service: Orthopedics;  Laterality: Left;  . AMPUTATION Left 03/11/2017   Procedure: LEFT BELOW KNEE AMPUTATION;  Surgeon: [name], MD;  Location: [facility];  Service: Orthopedics;  Laterality: Left;  . AMPUTATION Right 03/11/2017   Procedure: RIGHT 2ND TOE AMPUTATION;  Surgeon: [name], MD;  Location: [facility];  Service: Orthopedics;  Laterality: Right;  . AV FISTULA PLACEMENT  left arm  . CORONARY ARTERY BYPASS GRAFT N/A 11/04/2017   Procedure: CORONARY ARTERY BYPASS GRAFTING (CABG) x three, using left internal mammary artery and right    leg greater saphenous vein;  Surgeon: Melrose Nakayama, MD;  Location: [facility];  Service: Open Heart Surgery;  Laterality: N/A;  . FASCIOTOMY Right 08/07/2018   Procedure: FOUR COMPARTMENT FASCIOTOMY OF RIGHT LOWER LEG;  Surgeon: Rosetta Posner, MD;  Location: [facility];  Service: Vascular;  Laterality: Right;  . FASCIOTOMY CLOSURE Right 08/10/2018   Procedure: FASCIOTOMY CLOSURE RIGHT LOWER EXTREMITY;  Surgeon: [name], MD;  Location: [facility];  Service: Vascular;  Laterality: Right;  . HEMATOMA EVACUATION Right 08/07/2018   Procedure: EVACUATION HEMATOMA;  Surgeon: Rosetta Posner, MD;  Location: [place];  Service: Vascular;  Laterality: Right;  . LEFT HEART CATH AND CORONARY ANGIOGRAPHY N/A 10/31/2017   Procedure: LEFT HEART CATH AND CORONARY ANGIOGRAPHY;  Surgeon: Troy Sine, MD;  Location: [place] [facility];  Service: Cardiovascular;  Laterality: N/A;  . LEFT HEART CATHETERIZATION WITH CORONARY ANGIOGRAM N/A 09/13/2014   Procedure: LEFT HEART CATHETERIZATION WITH CORONARY ANGIOGRAM;  Surgeon: Sinclair Grooms, MD;  Location: [facility] CATH LAB;  Service: Cardiovascular;  Laterality: N/A;  . LOWER EXTREMITY ANGIOGRAPHY Bilateral 11/02/2016   Procedure: Lower Extremity Angiography;  Surgeon: Waynetta Sandy, MD;  Location: [facility];  Service: Cardiovascular;  Laterality: Bilateral;  . PERIPHERAL VASCULAR  ATHERECTOMY Left 11/02/2016   Procedure: Peripheral Vascular Atherectomy;  Surgeon: Waynetta Sandy, MD;  Location: [facility];  Service: Cardiovascular;  Laterality: Left;  PERONEAL  . PERIPHERAL VASCULAR ATHERECTOMY Right 08/07/2018   Procedure: PERIPHERAL VASCULAR ATHERECTOMY;  Surgeon: [name], MD;  Location: [facility];  Service: Cardiovascular;  Laterality: Right;  Peroneal artery  . PERIPHERAL VASCULAR BALLOON ANGIOPLASTY Right 08/07/2018   Procedure: PERIPHERAL VASCULAR BALLOON ANGIOPLASTY;  Surgeon: [name], MD;  Location: [place] [facility];  Service: Cardiovascular;  Laterality: Right;  anterior tibial artery  . STUMP REVISION Left 01/11/2017   Procedure: Revision Left Transmetatarsal Amputation;  Surgeon: [name], MD;  Location: [facility];  Service: Orthopedics;  Laterality: Left;  . TEE WITHOUT CARDIOVERSION N/A 11/04/2017   Procedure: TRANSESOPHAGEAL ECHOCARDIOGRAM (TEE);  Surgeon: Melrose Nakayama, MD;  Location: [facility];  Service: Open Heart Surgery;  Laterality: N/A;  . TRANSMETATARSAL AMPUTATION Right 08/10/2018   Procedure: RIGHT FIFTH TOE AMPUTATION;  Surgeon:  [name], MD;  Location: [facility];  Service: Vascular;  Laterality: Right;  . TRANSMETATARSAL AMPUTATION Right 09/14/2018   Procedure: TRANSMETATARSAL AMPUTATION;  Surgeon: [name], MD;  Location: [facility];  Service: Vascular;  Laterality: Right;        Home Medications    Prior to Admission medications   Medication Sig Start Date End Date Taking? Authorizing Provider  acetaminophen (TYLENOL) 325 MG tablet Take 2 tablets (650 mg total) by mouth every 4 (four) hours as needed for headache or mild pain. 09/08/18   Geradine Girt, DO  aspirin EC 81 MG tablet Take 1 tablet (81 mg total) by mouth daily. 02/07/18   Burtis Junes, NP  atorvastatin (LIPITOR) 80 MG tablet Take 1 tablet (80 mg total) by mouth daily at 6 PM. 08/17/18   Kayleen Memos, DO    calcitRIOL (ROCALTROL) 0.25 MCG capsule Take 5 capsules (1.25 mcg total) by mouth every Monday, Wednesday, and Friday with hemodialysis. 11/09/17   Elgie Collard, PA-C  carvedilol (COREG) 6.25 MG tablet Take 6.25 mg by mouth daily.    [provider]  clopidogrel (PLAVIX) 75 MG tablet Take 1 tablet (75 mg total) by mouth daily. 02/07/18   Burtis Junes, NP  insulin glargine (LANTUS) 100 UNIT/ML injection Inject 0.1 mLs (10 Units total) into the skin at bedtime. Patient taking differently: Inject 14 Units into the skin at bedtime.  09/08/18   Geradine Girt, DO  isosorbide mononitrate (IMDUR) 30 MG 24 hr tablet Take 1 tablet (30 mg total) by mouth daily. 09/08/18   Geradine Girt, DO  lamoTRIgine (LAMICTAL) 150 MG tablet Take 150 mg by mouth at bedtime.    [provider]  lanthanum (FOSRENOL) 1000 MG chewable tablet Chew 2 tablets (2,000 mg total) by mouth 3 (three) times daily with meals. 08/16/15   [name], MD  multivitamin (RENA-VIT) TABS tablet Take 1 tablet by mouth at bedtime. 08/17/18   Kayleen Memos, DO  ondansetron (ZOFRAN) 4 MG tablet Take 4 mg by mouth every 6 (six) hours as needed for nausea or vomiting.    [provider]  sertraline (ZOLOFT) 50 MG tablet Take 50 mg by mouth daily.     [provider]    Family History Family History  Problem Relation Age of Onset  . Heart failure Mother   . Hypertension Mother     Social History Social History   Tobacco Use  . Smoking status: Former Smoker    Packs/day: 0.50    Years: 26.00    Pack years: 13.00    Types: Cigarettes    Last attempt to quit: 08/17/2018    Years since quitting: 0.1  . Smokeless tobacco: Never Used  Substance Use Topics  . Alcohol use: Not Currently    Comment:  "haven't took a drink in over 5 years"  . Drug use: Yes    Types: Marijuana    Comment: occasional     Allergies   Coconut oil   Review of Systems Review of Systems  Skin: Positive for  wound.  All other systems reviewed and are negative.    Physical Exam Updated Vital Signs BP (!) 193/60 (BP Location: Right Arm)   Pulse 75   Temp 97.7 F (36.5 C) (Oral)   Resp 16   Ht 6\' 2"  (1.88 m)   Wt 79.8 kg   SpO2 98%   BMI 22.60 kg/m   Physical Exam Constitutional:  Appearance: Normal appearance.  Cardiovascular:     Rate and Rhythm: Normal rate and regular rhythm.  Pulmonary:     Effort: Pulmonary effort is normal.     Breath sounds: Normal breath sounds.  Musculoskeletal:     Comments: And metatarsal amputation of right foot  Skin:    Comments: Wound appears to be intact.  There is some bleeding from the lateral aspect of the wound, this appears to have stopped after the pressure dressing was placed by EMS.  No active bleeding currently.  Neurological:     Mental Status: He is alert.      ED Treatments / Results  Labs (all labs ordered are listed, but only abnormal results are displayed) Labs Reviewed - No data to display  EKG None  Radiology No results found.  Procedures Procedures (including critical care time)  Medications Ordered in ED Medications  oxyCODONE-acetaminophen (PERCOCET/ROXICET) 5-325 MG per tablet 1 tablet (has no administration in time range)     Initial Impression / Assessment and Plan / ED Course  I have reviewed the triage vital signs and the nursing notes.  Pertinent labs & imaging results that were available during my care of the patient were reviewed by me and considered in my medical decision making (see chart for details).        The wound is mostly intact.  There is some clot adherent to the lateral aspect, possibly a very tiny area of dehiscence there.  No evidence of erythema, swelling, infection.  No drainage.  No active bleeding.  Patient reassured, will place hemostatic pressure dressing on the area, follow-up with Dr. Carlis Abbott.  Final Clinical Impressions(s) / ED Diagnoses   Final diagnoses:    Postoperative pain  Postprocedural hemorrhage of skin and subcutaneous tissue following other procedure    ED Discharge Orders    None       Orpah Greek, MD 10/13/18 [phone]

## 2018-10-16 ENCOUNTER — Encounter: Payer: Medicaid Other | Admitting: Family

## 2018-10-28 ENCOUNTER — Emergency Department (HOSPITAL_COMMUNITY)
Admission: EM | Admit: 2018-10-28 | Discharge: 2018-10-29 | Disposition: A | Discharge status: Home / Self Care | Payer: Medicaid Other | Attending: Emergency Medicine | Admitting: Emergency Medicine

## 2018-10-28 ENCOUNTER — Encounter (HOSPITAL_COMMUNITY): Payer: Self-pay | Admitting: Emergency Medicine

## 2018-10-28 DIAGNOSIS — I252 Old myocardial infarction: Secondary | ICD-10-CM | POA: Diagnosis not present

## 2018-10-28 DIAGNOSIS — Z7982 Long term (current) use of aspirin: Secondary | ICD-10-CM | POA: Diagnosis not present

## 2018-10-28 DIAGNOSIS — I132 Hypertensive heart and chronic kidney disease with heart failure and with stage 5 chronic kidney disease, or end stage renal disease: Secondary | ICD-10-CM | POA: Insufficient documentation

## 2018-10-28 DIAGNOSIS — E1022 Type 1 diabetes mellitus with diabetic chronic kidney disease: Secondary | ICD-10-CM | POA: Diagnosis not present

## 2018-10-28 DIAGNOSIS — I2510 Atherosclerotic heart disease of native coronary artery without angina pectoris: Secondary | ICD-10-CM | POA: Insufficient documentation

## 2018-10-28 DIAGNOSIS — Z992 Dependence on renal dialysis: Secondary | ICD-10-CM | POA: Insufficient documentation

## 2018-10-28 DIAGNOSIS — T8131XA Disruption of external operation (surgical) wound, not elsewhere classified, initial encounter: Secondary | ICD-10-CM | POA: Diagnosis present

## 2018-10-28 DIAGNOSIS — Z794 Long term (current) use of insulin: Secondary | ICD-10-CM | POA: Insufficient documentation

## 2018-10-28 DIAGNOSIS — I5042 Chronic combined systolic (congestive) and diastolic (congestive) heart failure: Secondary | ICD-10-CM | POA: Insufficient documentation

## 2018-10-28 DIAGNOSIS — E119 Type 2 diabetes mellitus without complications: Secondary | ICD-10-CM | POA: Insufficient documentation

## 2018-10-28 DIAGNOSIS — Z7902 Long term (current) use of antithrombotics/antiplatelets: Secondary | ICD-10-CM | POA: Insufficient documentation

## 2018-10-28 DIAGNOSIS — N186 End stage renal disease: Secondary | ICD-10-CM | POA: Insufficient documentation

## 2018-10-28 DIAGNOSIS — F17210 Nicotine dependence, cigarettes, uncomplicated: Secondary | ICD-10-CM | POA: Insufficient documentation

## 2018-10-28 DIAGNOSIS — Y829 Unspecified medical devices associated with adverse incidents: Secondary | ICD-10-CM | POA: Diagnosis not present

## 2018-10-28 DIAGNOSIS — Z5189 Encounter for other specified aftercare: Secondary | ICD-10-CM

## 2018-10-28 LAB — CBC WITH DIFFERENTIAL/PLATELET
Abs Immature Granulocytes: 0.02 10*3/uL (ref 0.00–0.07)
Basophils Absolute: 0.1 10*3/uL (ref 0.0–0.1)
Basophils Relative: 1 %
EOS ABS: 0.1 10*3/uL (ref 0.0–0.5)
Eosinophils Relative: 2 %
HCT: 45 % (ref 39.0–52.0)
Hemoglobin: 14.3 g/dL (ref 13.0–17.0)
Immature Granulocytes: 0 %
Lymphocytes Relative: 22 %
Lymphs Abs: 1.9 10*3/uL (ref 0.7–4.0)
MCH: 25.7 pg — ABNORMAL LOW (ref 26.0–34.0)
MCHC: 31.8 g/dL (ref 30.0–36.0)
MCV: 80.9 fL (ref 80.0–100.0)
Monocytes Absolute: 0.7 10*3/uL (ref 0.1–1.0)
Monocytes Relative: 8 %
Neutro Abs: 6 10*3/uL (ref 1.7–7.7)
Neutrophils Relative %: 67 %
PLATELETS: 376 10*3/uL (ref 150–400)
RBC: 5.56 MIL/uL (ref 4.22–5.81)
RDW: 17.5 % — ABNORMAL HIGH (ref 11.5–15.5)
WBC: 8.8 10*3/uL (ref 4.0–10.5)
nRBC: 0 % (ref 0.0–0.2)

## 2018-10-28 LAB — BASIC METABOLIC PANEL
Anion gap: 23 — ABNORMAL HIGH (ref 5–15)
BUN: 79 mg/dL — ABNORMAL HIGH (ref 6–20)
CALCIUM: 8.1 mg/dL — AB (ref 8.9–10.3)
CO2: 15 mmol/L — ABNORMAL LOW (ref 22–32)
Chloride: 92 mmol/L — ABNORMAL LOW (ref 98–111)
Creatinine, Ser: 15.35 mg/dL — ABNORMAL HIGH (ref 0.61–1.24)
GFR calc Af Amer: 4 mL/min — ABNORMAL LOW (ref >60)
GFR calc non Af Amer: 3 mL/min — ABNORMAL LOW (ref >60)
Glucose, Bld: 148 mg/dL — ABNORMAL HIGH (ref 70–99)
Potassium: 4.2 mmol/L (ref 3.5–5.1)
Sodium: 130 mmol/L — ABNORMAL LOW (ref 135–145)

## 2018-10-28 LAB — CBG MONITORING, ED: Glucose-Capillary: 168 mg/dL — ABNORMAL HIGH (ref 70–99)

## 2018-10-28 MED ORDER — HYDROCODONE-ACETAMINOPHEN 5-325 MG PO TABS
1.0000 | ORAL_TABLET | Freq: Once | ORAL | Status: AC
  Administered 2018-10-28: 1 via ORAL
  Filled 2018-10-28: qty 1

## 2018-10-28 MED ORDER — LIDOCAINE HCL (PF) 1 % IJ SOLN
5.0000 mL | Freq: Once | INTRAMUSCULAR | Status: AC
  Administered 2018-10-28: 5 mL via INTRADERMAL
  Filled 2018-10-28: qty 5

## 2018-10-28 NOTE — ED Triage Notes (Addendum)
Pt transported from home by EMS from home, pt c/o pain to R foot. Partial amputation last week in January, staples removed 15 days ago, pt seen here for bleeding recently. Pt states pain has been increasing for last week, pt states he called surgeon and has appointment Monday. Pt states he has no meds at home and states the meds after surgery. HD MWF did not go Friday d/t pain

## 2018-10-29 NOTE — ED Provider Notes (Signed)
[facility] EMERGENCY DEPARTMENT Provider Note   CSN: [id number] Arrival date & time: 10/28/18  2111    History   Chief Complaint Chief Complaint  Patient presents with  . Wound Check    HPI [name] Fosnaugh Sr. is a 43 y.o. male with extensive past medical history as stated below most notably for ESRD currently Monday/Wednesday/Friday dialysis patient and recent partial amputation involving the right foot who presents the emergency department complaining of increasing pain at surgical site.  Patient states that he had increasing bleeding after slight dehiscence of the wound 2 weeks ago and was seen in the emergency department.  At that time wound did not appear to be infected and pressure dressing was placed.  He states that he thinks that some of the dressing may be impacted in the wound causing his increasing discomfort.  He states that he missed his dialysis session yesterday secondary to increasing pain.  He denies any recent fevers, chills, chest pain, shortness of breath, cough/cold/congestion, nausea or vomiting, or changes in bowel habits.      Illness  Severity:  Moderate Onset quality:  Gradual Duration:  2 days Timing:  Constant Progression:  Worsening Chronicity:  Recurrent Associated symptoms: no abdominal pain, no chest pain, no cough, no ear pain, no fever, no rash, no shortness of breath, no sore throat and no vomiting     Past Medical History:  Diagnosis Date  . Anemia   . Atherosclerosis of lower extremity ([place])   . Blood clot in vein    right calf  . CAD (coronary artery disease)   . Cataracts, bilateral   . Chronic combined systolic and diastolic heart failure ([place])   . Complication of anesthesia   . Depression   . ESRD (end stage renal disease) on dialysis [facility])    "MWF [employer]" (03/08/2017)  . GERD (gastroesophageal reflux disease)   . Heart murmur   . History of blood transfusion    "related to OR"  . Hypertension   .  Myocardial infarction ([place])     " light"  . Nonhealing surgical wound    nonviable tissue  . PONV (postoperative nausea and vomiting)   . S/P unilateral BKA (below knee amputation), left ([place])   . Type II diabetes mellitus ([place])   . Wears glasses     Patient Active Problem List   Diagnosis Date Noted  . Non-healing surgical wound 09/14/2018  . Chest pain, rule out acute myocardial infarction 09/07/2018  . Gangrene of toe of left foot ([place]) 08/05/2018  . 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 Gangrene of foot ([place]) 08/05/2018  . 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 ACS (acute coronary syndrome) ([place]) 10/28/2017  . Flash pulmonary edema ([place]) 06/04/2017  . Hypertensive emergency 06/04/2017  . 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 S/P BKA (below knee amputation) unilateral, left ([place])   . Post-operative pain   . Acute blood loss anemia   . Chronic combined systolic and diastolic CHF (congestive heart failure) ([place])   . PVD (peripheral vascular disease) ([place])   . Coronary artery disease involving native coronary artery of native heart with unstable angina pectoris ([place])   . Tobacco abuse   . Diabetic foot infection ([place]) 03/08/2017  . Osteomyelitis ([place]) 03/08/2017  . Wound dehiscence   . Dehiscence of  amputation stump ([place]) 01/04/2017  . Gangrene of right foot ([place]) 08/02/2016  . Achilles tendon contracture, left 08/02/2016  . Anemia of renal disease 08/16/2015  . Wound infection 08/15/2015  . Diabetic ulcer of right great toe ([place]) 08/15/2015  . Pain in the chest   . Elevated troponin   . Essential hypertension   . ESRD on dialysis ([place]) 08/07/2013  . Chest pain 05/16/2012  . Murmur 05/16/2012  . Renal disorder     Past Surgical History:  Procedure Laterality Date  . ABDOMINAL AORTOGRAM N/A 11/02/2016   Procedure:  Abdominal Aortogram;  Surgeon: Waynetta Sandy, MD;  Location: [place] [facility];  Service: Cardiovascular;  Laterality: N/A;  . ABDOMINAL AORTOGRAM W/LOWER EXTREMITY Right 08/07/2018   Procedure: ABDOMINAL AORTOGRAM W/LOWER EXTREMITY;  Surgeon: [name], MD;  Location: [place] [facility];  Service: Cardiovascular;  Laterality: Right;  . AMPUTATION Left 09/27/2013   Procedure: LEFT GREAT TOE AMPUTATION;  Surgeon: [name], MD;  Location: [place];  Service: Orthopedics;  Laterality: Left;  . AMPUTATION Right 08/15/2015   Procedure: Right Great Toe Amputation;  Surgeon: [name], MD;  Location: [place];  Service: Orthopedics;  Laterality: Right;  . AMPUTATION Left 11/05/2016   Procedure: TRANSMETATARSAL AMPUTATION LEFT FOOT;  Surgeon: [name], MD;  Location: [place];  Service: Orthopedics;  Laterality: Left;  . AMPUTATION Left 03/11/2017   Procedure: LEFT BELOW KNEE AMPUTATION;  Surgeon: [name], MD;  Location: [place];  Service: Orthopedics;  Laterality: Left;  . AMPUTATION Right 03/11/2017   Procedure: RIGHT 2ND TOE AMPUTATION;  Surgeon: [name], MD;  Location: [place];  Service: Orthopedics;  Laterality: Right;  . AV FISTULA PLACEMENT  left arm  . CORONARY ARTERY BYPASS GRAFT N/A 11/04/2017   Procedure: CORONARY ARTERY BYPASS GRAFTING (CABG) x three, using left internal mammary artery and right    leg greater saphenous vein;  Surgeon: Melrose Nakayama, MD;  Location: [place];  Service: Open Heart Surgery;  Laterality: N/A;  . FASCIOTOMY Right 08/07/2018   Procedure: FOUR COMPARTMENT FASCIOTOMY OF RIGHT LOWER LEG;  Surgeon: Rosetta Posner, MD;  Location: [place];  Service: Vascular;  Laterality: Right;  . FASCIOTOMY CLOSURE Right 08/10/2018   Procedure: FASCIOTOMY CLOSURE RIGHT LOWER EXTREMITY;  Surgeon: [name], MD;  Location: [place];  Service: Vascular;  Laterality: Right;  . HEMATOMA EVACUATION Right 08/07/2018   Procedure: EVACUATION HEMATOMA;   Surgeon: Rosetta Posner, MD;  Location: [place];  Service: Vascular;  Laterality: Right;  . LEFT HEART CATH AND CORONARY ANGIOGRAPHY N/A 10/31/2017   Procedure: LEFT HEART CATH AND CORONARY ANGIOGRAPHY;  Surgeon: Troy Sine, MD;  Location: [place] [facility];  Service: Cardiovascular;  Laterality: N/A;  . LEFT HEART CATHETERIZATION WITH CORONARY ANGIOGRAM N/A 09/13/2014   Procedure: LEFT HEART CATHETERIZATION WITH CORONARY ANGIOGRAM;  Surgeon: Sinclair Grooms, MD;  Location: [facility] C[facility] LAB;  Service: Cardiovascular;  Laterality: N/A;  . LOWER EXTREMITY ANGIOGRAPHY Bilateral 11/02/2016   Procedure: Lower Extremity Angiography;  Surgeon: Waynetta Sandy, MD;  Location: [place] [facility];  Service: Cardiovascular;  Laterality: Bilateral;  . PERIPHERAL VASCULAR ATHERECTOMY Left 11/02/2016   Procedure: Peripheral Vascular Atherectomy;  Surgeon: Waynetta Sandy, MD;  Location: [place] [facility];  Service: Cardiovascular;  Laterality: Left;  PERONEAL  . PERIPHERAL VASCULAR ATHERECTOMY Right 08/07/2018   Procedure: PERIPHERAL VASCULAR ATHERECTOMY;  Surgeon: [name], MD;  Location: [place]  CV LAB;  Service: Cardiovascular;  Laterality: Right;  Peroneal artery  . PERIPHERAL VASCULAR BALLOON ANGIOPLASTY Right 08/07/2018   Procedure: PERIPHERAL VASCULAR BALLOON ANGIOPLASTY;  Surgeon: [name], MD;  Location: [place] [facility] LAB;  Service: Cardiovascular;  Laterality: Right;  anterior tibial artery  . STUMP REVISION Left 01/11/2017   Procedure: Revision Left Transmetatarsal Amputation;  Surgeon: [name], MD;  Location: [address];  Service: Orthopedics;  Laterality: Left;  . TEE WITHOUT CARDIOVERSION N/A 11/04/2017   Procedure: TRANSESOPHAGEAL ECHOCARDIOGRAM (TEE);  Surgeon: Melrose Nakayama, MD;  Location: [place];  Service: Open Heart Surgery;  Laterality: N/A;  . TRANSMETATARSAL AMPUTATION Right 08/10/2018   Procedure: RIGHT FIFTH TOE AMPUTATION;  Surgeon:  [name], MD;  Location: [place];  Service: Vascular;  Laterality: Right;  . TRANSMETATARSAL AMPUTATION Right 09/14/2018   Procedure: TRANSMETATARSAL AMPUTATION;  Surgeon: [name], MD;  Location: [place];  Service: Vascular;  Laterality: Right;        Home Medications    Prior to Admission medications   Medication Sig Start Date End Date Taking? Authorizing Provider  acetaminophen (TYLENOL) 325 MG tablet Take 2 tablets (650 mg total) by mouth every 4 (four) hours as needed for headache or mild pain. 09/08/18   Geradine Girt, DO  aspirin EC 81 MG tablet Take 1 tablet (81 mg total) by mouth daily. 02/07/18   Burtis Junes, NP  atorvastatin (LIPITOR) 80 MG tablet Take 1 tablet (80 mg total) by mouth daily at 6 PM. 08/17/18   Kayleen Memos, DO  calcitRIOL (ROCALTROL) 0.25 MCG capsule Take 5 capsules (1.25 mcg total) by mouth every Monday, Wednesday, and Friday with hemodialysis. 11/09/17   Elgie Collard, PA-C  carvedilol (COREG) 6.25 MG tablet Take 6.25 mg by mouth daily.    [provider]  clopidogrel (PLAVIX) 75 MG tablet Take 1 tablet (75 mg total) by mouth daily. 02/07/18   Burtis Junes, NP  insulin glargine (LANTUS) 100 UNIT/ML injection Inject 0.1 mLs (10 Units total) into the skin at bedtime. Patient taking differently: Inject 14 Units into the skin at bedtime.  09/08/18   Geradine Girt, DO  isosorbide mononitrate (IMDUR) 30 MG 24 hr tablet Take 1 tablet (30 mg total) by mouth daily. 09/08/18   Geradine Girt, DO  lamoTRIgine (LAMICTAL) 150 MG tablet Take 150 mg by mouth at bedtime.    [provider]  lanthanum (FOSRENOL) 1000 MG chewable tablet Chew 2 tablets (2,000 mg total) by mouth 3 (three) times daily with meals. 08/16/15   [name], MD  multivitamin (RENA-VIT) TABS tablet Take 1 tablet by mouth at bedtime. 08/17/18   Kayleen Memos, DO  ondansetron (ZOFRAN) 4 MG tablet Take 4 mg by mouth every 6 (six) hours as needed for nausea or  vomiting.    [provider]  sertraline (ZOLOFT) 50 MG tablet Take 50 mg by mouth daily.     [provider]    Family History Family History  Problem Relation Age of Onset  . Heart failure Mother   . Hypertension Mother     Social History Social History   Tobacco Use  . Smoking status: Former Smoker    Packs/day: 0.50    Years: 26.00    Pack years: 13.00    Types: Cigarettes    Last attempt to quit: 08/17/2018    Years since quitting: 0.2  . Smokeless tobacco: Never Used  Substance Use Topics  .  Alcohol use: Not Currently    Comment:  "haven't took a drink in over 5 years"  . Drug use: Yes    Types: Marijuana    Comment: occasional     Allergies   Coconut oil   Review of Systems Review of Systems  Constitutional: Negative for chills and fever.  HENT: Negative for ear pain and sore throat.   Eyes: Negative for pain and visual disturbance.  Respiratory: Negative for cough and shortness of breath.   Cardiovascular: Negative for chest pain and palpitations.  Gastrointestinal: Negative for abdominal pain and vomiting.  Genitourinary: Negative for dysuria and hematuria.  Musculoskeletal: Negative for arthralgias and back pain.       Right foot pain.   Skin: Negative for color change and rash.  Neurological: Negative for seizures and syncope.  All other systems reviewed and are negative.    Physical Exam Updated Vital Signs BP (!) 142/74 (BP Location: Right Arm)   Pulse 74   Temp (!) 97.4 F (36.3 C) (Oral)   Resp 16   Ht 6\' 2"  (1.88 m)   Wt 79.8 kg   SpO2 98%   BMI 22.60 kg/m   Physical Exam Vitals signs and nursing note reviewed.  Constitutional:      Appearance: He is well-developed.  HENT:     Head: Normocephalic and atraumatic.  Eyes:     Conjunctiva/sclera: Conjunctivae normal.  Neck:     Musculoskeletal: Neck supple.  Cardiovascular:     Rate and Rhythm: Normal rate and regular rhythm.     Heart sounds: No murmur.   Pulmonary:     Effort: Pulmonary effort is normal. No respiratory distress.     Breath sounds: Normal breath sounds.  Abdominal:     Palpations: Abdomen is soft.     Tenderness: There is no abdominal tenderness.  Musculoskeletal:     Comments: Dialysis fistula left upper extremity with palpable thrill and bruit.  Patient has left BKA.  Partial amputation involving the right foot with slight dehiscence of the lateral aspect of the surgical site.  Small amount of friable material at the site.  No surrounding erythema, edema, or induration to suggest infectious process.  No purulent drainage noted at the site.  Lower extremities warm and well-perfused.  Skin:    General: Skin is warm and dry.  Neurological:     Mental Status: He is alert.      ED Treatments / Results  Labs (all labs ordered are listed, but only abnormal results are displayed) Labs Reviewed  CBC WITH DIFFERENTIAL/PLATELET - Abnormal; Notable for the following components:      Result Value   MCH 25.7 (*)    RDW 17.5 (*)    All other components within normal limits  BASIC METABOLIC PANEL - Abnormal; Notable for the following components:   Sodium 130 (*)    Chloride 92 (*)    CO2 15 (*)    Glucose, Bld 148 (*)    BUN 79 (*)    Creatinine, Ser 15.35 (*)    Calcium 8.1 (*)    GFR calc non Af Amer 3 (*)    GFR calc Af Amer 4 (*)    Anion gap 23 (*)    All other components within normal limits  CBG MONITORING, ED - Abnormal; Notable for the following components:   Glucose-Capillary 168 (*)    All other components within normal limits    EKG None  Radiology No results found.  Procedures  Wound repair Date/Time: 10/29/2018 12:39 AM Performed by: Tommie Raymond, MD Authorized by: Tommie Raymond, MD  Consent: Verbal consent obtained. Consent given by: patient Patient understanding: patient states understanding of the procedure being performed Patient consent: the patient's understanding of the procedure matches  consent given Procedure consent: procedure consent matches procedure scheduled Relevant documents: relevant documents present and verified Test results: test results available and properly labeled Site marked: the operative site was marked Imaging studies: imaging studies available Patient identity confirmed: verbally with patient Preparation: Patient was prepped and draped in the usual sterile fashion. Local anesthesia used: yes Anesthesia: local infiltration  Anesthesia: Local anesthesia used: yes Local Anesthetic: lidocaine 1% without epinephrine Anesthetic total: 3 mL Patient tolerance: Patient tolerated the procedure well with no immediate complications Comments: Friable material the lateral margin of the patient surgical wound was explored.  Does not appear to extend deep within the patient's wound.  No easily identifiable packing to be removed.  Patient tolerated procedure well and without issue.  Wound left open.    (including critical care time)  Medications Ordered in ED Medications  lidocaine (PF) (XYLOCAINE) 1 % injection 5 mL (5 mLs Intradermal Given 10/28/18 2356)  HYDROcodone-acetaminophen (NORCO/VICODIN) 5-325 MG per tablet 1 tablet (1 tablet Oral Given 10/28/18 2355)     Initial Impression / Assessment and Plan / ED Course  I have reviewed the triage vital signs and the nursing notes.  Pertinent labs & imaging results that were available during my care of the patient were reviewed by me and considered in my medical decision making (see chart for details).       Patient is a 43 year old male with extensive past medical history most notably for ESRD and Monday Wednesday Friday dialysis who presents emergency department complaining of pain in his recently operated on right foot as well as missed dialysis session 1 day ago.  On initial evaluation the patient he was hemodynamically stable and nontoxic-appearing.  Patient was afebrile, remaining vitals unremarkable.   Physical exam as detailed above which is remarkable for slight dehiscence of the lateral margin of the patient's partial amputation surgical wound on the right foot.  Small amount of friable material at the site.  This was explored at bedside.  1% lidocaine without epinephrine was injected into the site to aid in expiration.  Small amount of friable material was removed from the wound.  Patient states that he thinks it was packed on his last assessment.  Records reviewed which did not mention any packing, and only pressure dressing applied.  Small friable material likely secondary to partial impaction of dressing within clotted tissue.  There is no surrounding erythema, edema, induration to suggest underlying infection at this time.  Wound appears to be healing appropriately.  Patient has appointment with vascular surgery scheduled for 2 days from now.  I feel he is appropriate for follow-up with him at this time for reevaluation.  As wound was explored patient was given 1 dose of p.o. Norco while in the emergency department.  In regards the patient's missed dialysis session metabolic panel was checked.  He has no electrolyte derangements that would necessitate emergent dialysis.  Patient does have anion gap metabolic acidosis.  This was discussed with on-call nephrologist.  Given patient's overall well appearance do not feel emergent dialysis is necessary this time.  He is safe for discharge and dialysis at his already scheduled session in the next 2 days.  I discussed concerning signs and symptoms that would necessitate return  to the emergency department.  Patient voiced understanding of these instructions and had no further questions at this time.  He remained comfortable while in the emergency department watching TV and was able to tolerate p.o. at the time of discharge.   Final Clinical Impressions(s) / ED Diagnoses   Final diagnoses:  Visit for wound check    ED Discharge Orders    None        Tommie Raymond, MD 10/29/18 [place], [place], DO 10/29/18 1610

## 2018-10-30 ENCOUNTER — Encounter: Payer: Self-pay | Admitting: Family

## 2018-10-30 ENCOUNTER — Ambulatory Visit: Payer: Medicaid Other | Admitting: Family

## 2018-11-01 ENCOUNTER — Telehealth: Payer: Self-pay

## 2018-11-01 NOTE — Telephone Encounter (Signed)
Appointment Cancelled due to Coronavirus:  Called patient in regards to Hospital f/u appointment with Cecilie Kicks, NP on 11/07/18.   Patient denies having any chest pain, SOB, cough, fever, or any other Sx.   Patient wants to cancel appointment due to not having transportation on that date and will contact our in the near future to reschedule.    Patient understands to let us know if they develop any Sx before then.

## 2018-11-03 ENCOUNTER — Emergency Department (HOSPITAL_COMMUNITY): Payer: Medicaid Other

## 2018-11-03 ENCOUNTER — Encounter (HOSPITAL_COMMUNITY): Payer: Self-pay

## 2018-11-03 ENCOUNTER — Inpatient Hospital Stay (HOSPITAL_COMMUNITY)
Admission: EM | Admit: 2018-11-03 | Discharge: 2018-11-07 | DRG: 564 | Disposition: A | Discharge status: Home Health | Payer: Medicaid Other | Source: Other Acute Inpatient Hospital | Attending: Internal Medicine | Admitting: Internal Medicine

## 2018-11-03 DIAGNOSIS — T797XXA Traumatic subcutaneous emphysema, initial encounter: Secondary | ICD-10-CM | POA: Diagnosis present

## 2018-11-03 DIAGNOSIS — E1022 Type 1 diabetes mellitus with diabetic chronic kidney disease: Secondary | ICD-10-CM | POA: Diagnosis present

## 2018-11-03 DIAGNOSIS — R509 Fever, unspecified: Secondary | ICD-10-CM | POA: Diagnosis not present

## 2018-11-03 DIAGNOSIS — E1051 Type 1 diabetes mellitus with diabetic peripheral angiopathy without gangrene: Secondary | ICD-10-CM | POA: Diagnosis present

## 2018-11-03 DIAGNOSIS — Z89431 Acquired absence of right foot: Secondary | ICD-10-CM

## 2018-11-03 DIAGNOSIS — K219 Gastro-esophageal reflux disease without esophagitis: Secondary | ICD-10-CM | POA: Diagnosis present

## 2018-11-03 DIAGNOSIS — D631 Anemia in chronic kidney disease: Secondary | ICD-10-CM | POA: Diagnosis present

## 2018-11-03 DIAGNOSIS — I219 Acute myocardial infarction, unspecified: Secondary | ICD-10-CM | POA: Diagnosis not present

## 2018-11-03 DIAGNOSIS — Z992 Dependence on renal dialysis: Secondary | ICD-10-CM

## 2018-11-03 DIAGNOSIS — F329 Major depressive disorder, single episode, unspecified: Secondary | ICD-10-CM | POA: Diagnosis present

## 2018-11-03 DIAGNOSIS — Z8249 Family history of ischemic heart disease and other diseases of the circulatory system: Secondary | ICD-10-CM

## 2018-11-03 DIAGNOSIS — Z89421 Acquired absence of other right toe(s): Secondary | ICD-10-CM | POA: Diagnosis not present

## 2018-11-03 DIAGNOSIS — I10 Essential (primary) hypertension: Secondary | ICD-10-CM | POA: Diagnosis present

## 2018-11-03 DIAGNOSIS — Y835 Amputation of limb(s) as the cause of abnormal reaction of the patient, or of later complication, without mention of misadventure at the time of the procedure: Secondary | ICD-10-CM | POA: Diagnosis present

## 2018-11-03 DIAGNOSIS — F17211 Nicotine dependence, cigarettes, in remission: Secondary | ICD-10-CM | POA: Diagnosis not present

## 2018-11-03 DIAGNOSIS — N2581 Secondary hyperparathyroidism of renal origin: Secondary | ICD-10-CM | POA: Diagnosis present

## 2018-11-03 DIAGNOSIS — H269 Unspecified cataract: Secondary | ICD-10-CM | POA: Diagnosis present

## 2018-11-03 DIAGNOSIS — I252 Old myocardial infarction: Secondary | ICD-10-CM

## 2018-11-03 DIAGNOSIS — E871 Hypo-osmolality and hyponatremia: Secondary | ICD-10-CM | POA: Diagnosis not present

## 2018-11-03 DIAGNOSIS — E10628 Type 1 diabetes mellitus with other skin complications: Secondary | ICD-10-CM | POA: Diagnosis not present

## 2018-11-03 DIAGNOSIS — I2510 Atherosclerotic heart disease of native coronary artery without angina pectoris: Secondary | ICD-10-CM | POA: Diagnosis not present

## 2018-11-03 DIAGNOSIS — E785 Hyperlipidemia, unspecified: Secondary | ICD-10-CM | POA: Diagnosis present

## 2018-11-03 DIAGNOSIS — Z79899 Other long term (current) drug therapy: Secondary | ICD-10-CM

## 2018-11-03 DIAGNOSIS — I21A1 Myocardial infarction type 2: Secondary | ICD-10-CM | POA: Diagnosis present

## 2018-11-03 DIAGNOSIS — L089 Local infection of the skin and subcutaneous tissue, unspecified: Secondary | ICD-10-CM

## 2018-11-03 DIAGNOSIS — T148XXA Other injury of unspecified body region, initial encounter: Secondary | ICD-10-CM

## 2018-11-03 DIAGNOSIS — Z794 Long term (current) use of insulin: Secondary | ICD-10-CM

## 2018-11-03 DIAGNOSIS — I132 Hypertensive heart and chronic kidney disease with heart failure and with stage 5 chronic kidney disease, or end stage renal disease: Secondary | ICD-10-CM | POA: Diagnosis present

## 2018-11-03 DIAGNOSIS — E1065 Type 1 diabetes mellitus with hyperglycemia: Secondary | ICD-10-CM | POA: Diagnosis not present

## 2018-11-03 DIAGNOSIS — Z86718 Personal history of other venous thrombosis and embolism: Secondary | ICD-10-CM

## 2018-11-03 DIAGNOSIS — T8743 Infection of amputation stump, right lower extremity: Secondary | ICD-10-CM | POA: Diagnosis present

## 2018-11-03 DIAGNOSIS — N186 End stage renal disease: Secondary | ICD-10-CM | POA: Diagnosis present

## 2018-11-03 DIAGNOSIS — Z91018 Allergy to other foods: Secondary | ICD-10-CM

## 2018-11-03 DIAGNOSIS — E1029 Type 1 diabetes mellitus with other diabetic kidney complication: Secondary | ICD-10-CM | POA: Diagnosis present

## 2018-11-03 DIAGNOSIS — I214 Non-ST elevation (NSTEMI) myocardial infarction: Secondary | ICD-10-CM | POA: Diagnosis not present

## 2018-11-03 DIAGNOSIS — B9562 Methicillin resistant Staphylococcus aureus infection as the cause of diseases classified elsewhere: Secondary | ICD-10-CM | POA: Diagnosis present

## 2018-11-03 DIAGNOSIS — F17210 Nicotine dependence, cigarettes, uncomplicated: Secondary | ICD-10-CM | POA: Diagnosis not present

## 2018-11-03 DIAGNOSIS — Z951 Presence of aortocoronary bypass graft: Secondary | ICD-10-CM

## 2018-11-03 DIAGNOSIS — I5042 Chronic combined systolic (congestive) and diastolic (congestive) heart failure: Secondary | ICD-10-CM | POA: Diagnosis present

## 2018-11-03 DIAGNOSIS — T8140XA Infection following a procedure, unspecified, initial encounter: Secondary | ICD-10-CM | POA: Diagnosis not present

## 2018-11-03 DIAGNOSIS — Z89512 Acquired absence of left leg below knee: Secondary | ICD-10-CM

## 2018-11-03 DIAGNOSIS — R7881 Bacteremia: Secondary | ICD-10-CM | POA: Diagnosis present

## 2018-11-03 DIAGNOSIS — I25118 Atherosclerotic heart disease of native coronary artery with other forms of angina pectoris: Secondary | ICD-10-CM | POA: Diagnosis not present

## 2018-11-03 DIAGNOSIS — I5020 Unspecified systolic (congestive) heart failure: Secondary | ICD-10-CM | POA: Diagnosis present

## 2018-11-03 DIAGNOSIS — E8889 Other specified metabolic disorders: Secondary | ICD-10-CM | POA: Diagnosis present

## 2018-11-03 DIAGNOSIS — Z9115 Patient's noncompliance with renal dialysis: Secondary | ICD-10-CM

## 2018-11-03 DIAGNOSIS — Z87891 Personal history of nicotine dependence: Secondary | ICD-10-CM

## 2018-11-03 LAB — BLOOD CULTURE ID PANEL (REFLEXED)
ACINETOBACTER BAUMANNII: NOT DETECTED
Candida albicans: NOT DETECTED
Candida glabrata: NOT DETECTED
Candida krusei: NOT DETECTED
Candida parapsilosis: NOT DETECTED
Candida tropicalis: NOT DETECTED
Enterobacter cloacae complex: NOT DETECTED
Enterobacteriaceae species: NOT DETECTED
Enterococcus species: NOT DETECTED
Escherichia coli: NOT DETECTED
Haemophilus influenzae: NOT DETECTED
Klebsiella oxytoca: NOT DETECTED
Klebsiella pneumoniae: NOT DETECTED
LISTERIA MONOCYTOGENES: NOT DETECTED
Methicillin resistance: DETECTED — AB
NEISSERIA MENINGITIDIS: NOT DETECTED
Proteus species: NOT DETECTED
Pseudomonas aeruginosa: NOT DETECTED
STREPTOCOCCUS AGALACTIAE: NOT DETECTED
STREPTOCOCCUS PNEUMONIAE: NOT DETECTED
Serratia marcescens: NOT DETECTED
Staphylococcus aureus (BCID): DETECTED — AB
Staphylococcus species: DETECTED — AB
Streptococcus pyogenes: NOT DETECTED
Streptococcus species: NOT DETECTED

## 2018-11-03 LAB — TROPONIN I
Troponin I: 1.46 ng/mL (ref <0.03)
Troponin I: 1.66 ng/mL (ref <0.03)

## 2018-11-03 LAB — COMPREHENSIVE METABOLIC PANEL
ALT: 11 U/L (ref 0–44)
AST: 22 U/L (ref 15–41)
Albumin: 2.7 g/dL — ABNORMAL LOW (ref 3.5–5.0)
Alkaline Phosphatase: 72 U/L (ref 38–126)
Anion gap: 19 — ABNORMAL HIGH (ref 5–15)
BILIRUBIN TOTAL: 0.6 mg/dL (ref 0.3–1.2)
BUN: 81 mg/dL — AB (ref 6–20)
CO2: 16 mmol/L — ABNORMAL LOW (ref 22–32)
CREATININE: 18.45 mg/dL — AB (ref 0.61–1.24)
Calcium: 7.7 mg/dL — ABNORMAL LOW (ref 8.9–10.3)
Chloride: 89 mmol/L — ABNORMAL LOW (ref 98–111)
GFR calc Af Amer: 3 mL/min — ABNORMAL LOW (ref >60)
GFR calc non Af Amer: 3 mL/min — ABNORMAL LOW (ref >60)
Glucose, Bld: 362 mg/dL — ABNORMAL HIGH (ref 70–99)
Potassium: 3.8 mmol/L (ref 3.5–5.1)
Sodium: 124 mmol/L — ABNORMAL LOW (ref 135–145)
Total Protein: 7.7 g/dL (ref 6.5–8.1)

## 2018-11-03 LAB — LACTIC ACID, PLASMA: Lactic Acid, Venous: 1.7 mmol/L (ref 0.5–1.9)

## 2018-11-03 LAB — I-STAT TROPONIN, ED: Troponin i, poc: 1.97 ng/mL (ref 0.00–0.08)

## 2018-11-03 LAB — CBC WITH DIFFERENTIAL/PLATELET
Abs Immature Granulocytes: 0.11 10*3/uL — ABNORMAL HIGH (ref 0.00–0.07)
Basophils Absolute: 0 10*3/uL (ref 0.0–0.1)
Basophils Relative: 0 %
Eosinophils Absolute: 0.1 10*3/uL (ref 0.0–0.5)
Eosinophils Relative: 1 %
HCT: 35 % — ABNORMAL LOW (ref 39.0–52.0)
Hemoglobin: 12.1 g/dL — ABNORMAL LOW (ref 13.0–17.0)
Immature Granulocytes: 1 %
Lymphocytes Relative: 7 %
Lymphs Abs: 1.3 10*3/uL (ref 0.7–4.0)
MCH: 26.7 pg (ref 26.0–34.0)
MCHC: 34.6 g/dL (ref 30.0–36.0)
MCV: 77.1 fL — ABNORMAL LOW (ref 80.0–100.0)
Monocytes Absolute: 1 10*3/uL (ref 0.1–1.0)
Monocytes Relative: 6 %
Neutro Abs: 15.8 10*3/uL — ABNORMAL HIGH (ref 1.7–7.7)
Neutrophils Relative %: 85 %
Platelets: 426 10*3/uL — ABNORMAL HIGH (ref 150–400)
RBC: 4.54 MIL/uL (ref 4.22–5.81)
RDW: 17.8 % — ABNORMAL HIGH (ref 11.5–15.5)
WBC: 18.4 10*3/uL — ABNORMAL HIGH (ref 4.0–10.5)
nRBC: 0 % (ref 0.0–0.2)

## 2018-11-03 LAB — GLUCOSE, CAPILLARY: Glucose-Capillary: 291 mg/dL — ABNORMAL HIGH (ref 70–99)

## 2018-11-03 LAB — PHOSPHORUS: Phosphorus: 4 mg/dL (ref 2.5–4.6)

## 2018-11-03 MED ORDER — RENA-VITE PO TABS
1.0000 | ORAL_TABLET | Freq: Every day | ORAL | Status: DC
  Administered 2018-11-03 – 2018-11-06 (×4): 1 via ORAL
  Filled 2018-11-03 (×4): qty 1

## 2018-11-03 MED ORDER — CARVEDILOL 6.25 MG PO TABS
6.2500 mg | ORAL_TABLET | Freq: Two times a day (BID) | ORAL | Status: DC
  Administered 2018-11-03 – 2018-11-07 (×9): 6.25 mg via ORAL
  Filled 2018-11-03 (×8): qty 1

## 2018-11-03 MED ORDER — SODIUM CHLORIDE 0.9 % IV SOLN
2.0000 g | Freq: Once | INTRAVENOUS | Status: AC
  Administered 2018-11-03: 2 g via INTRAVENOUS
  Filled 2018-11-03: qty 2

## 2018-11-03 MED ORDER — MORPHINE SULFATE (PF) 4 MG/ML IV SOLN
4.0000 mg | Freq: Once | INTRAVENOUS | Status: AC
  Administered 2018-11-03: 4 mg via INTRAVENOUS
  Filled 2018-11-03: qty 1

## 2018-11-03 MED ORDER — CHLORHEXIDINE GLUCONATE CLOTH 2 % EX PADS
6.0000 | MEDICATED_PAD | Freq: Every day | CUTANEOUS | Status: DC
  Administered 2018-11-07: 6 via TOPICAL

## 2018-11-03 MED ORDER — VANCOMYCIN VARIABLE DOSE PER UNSTABLE RENAL FUNCTION (PHARMACIST DOSING): Status: DC

## 2018-11-03 MED ORDER — CARVEDILOL 12.5 MG PO TABS
6.2500 mg | ORAL_TABLET | Freq: Every day | ORAL | Status: DC
  Filled 2018-11-03: qty 0.5

## 2018-11-03 MED ORDER — SODIUM CHLORIDE 0.9 % IV SOLN: 1.0000 g | INTRAVENOUS | Status: DC

## 2018-11-03 MED ORDER — INSULIN ASPART 100 UNIT/ML ~~LOC~~ SOLN
0.0000 [IU] | Freq: Three times a day (TID) | SUBCUTANEOUS | Status: DC
  Administered 2018-11-04: 9 [IU] via SUBCUTANEOUS
  Administered 2018-11-04: 1 [IU] via SUBCUTANEOUS
  Administered 2018-11-04 – 2018-11-05 (×2): 2 [IU] via SUBCUTANEOUS
  Administered 2018-11-05: 1 [IU] via SUBCUTANEOUS
  Administered 2018-11-05: 5 [IU] via SUBCUTANEOUS
  Administered 2018-11-06: 7 [IU] via SUBCUTANEOUS
  Administered 2018-11-07: 5 [IU] via SUBCUTANEOUS
  Administered 2018-11-07: 2 [IU] via SUBCUTANEOUS

## 2018-11-03 MED ORDER — METRONIDAZOLE IN NACL 5-0.79 MG/ML-% IV SOLN
500.0000 mg | Freq: Once | INTRAVENOUS | Status: AC
  Administered 2018-11-03: 500 mg via INTRAVENOUS
  Filled 2018-11-03: qty 100

## 2018-11-03 MED ORDER — CALCITRIOL 0.5 MCG PO CAPS
1.2500 ug | ORAL_CAPSULE | ORAL | Status: DC
  Administered 2018-11-06: 1.25 ug via ORAL

## 2018-11-03 MED ORDER — VANCOMYCIN HCL IN DEXTROSE 750-5 MG/150ML-% IV SOLN
750.0000 mg | INTRAVENOUS | Status: AC
  Administered 2018-11-03: 750 mg via INTRAVENOUS
  Filled 2018-11-03 (×2): qty 150

## 2018-11-03 MED ORDER — ACETAMINOPHEN 325 MG PO TABS
650.0000 mg | ORAL_TABLET | Freq: Four times a day (QID) | ORAL | Status: DC | PRN
  Administered 2018-11-04 – 2018-11-07 (×7): 650 mg via ORAL
  Filled 2018-11-03 (×6): qty 2

## 2018-11-03 MED ORDER — INSULIN GLARGINE 100 UNIT/ML ~~LOC~~ SOLN
8.0000 [IU] | Freq: Every day | SUBCUTANEOUS | Status: DC
  Administered 2018-11-04 – 2018-11-07 (×4): 8 [IU] via SUBCUTANEOUS
  Filled 2018-11-03 (×5): qty 0.08

## 2018-11-03 MED ORDER — ASPIRIN EC 81 MG PO TBEC
81.0000 mg | DELAYED_RELEASE_TABLET | Freq: Every day | ORAL | Status: DC
  Administered 2018-11-04 – 2018-11-07 (×4): 81 mg via ORAL
  Filled 2018-11-03 (×4): qty 1

## 2018-11-03 MED ORDER — CALCITRIOL 0.5 MCG PO CAPS: 1.2500 ug | ORAL_CAPSULE | ORAL | Status: DC

## 2018-11-03 MED ORDER — VANCOMYCIN HCL 10 G IV SOLR
1500.0000 mg | Freq: Once | INTRAVENOUS | Status: AC
  Administered 2018-11-03: 1500 mg via INTRAVENOUS
  Filled 2018-11-03: qty 1500

## 2018-11-03 MED ORDER — VANCOMYCIN HCL IN DEXTROSE 1-5 GM/200ML-% IV SOLN: 1000.0000 mg | Freq: Once | INTRAVENOUS | Status: DC

## 2018-11-03 MED ORDER — OXYCODONE HCL 5 MG PO TABS
5.0000 mg | ORAL_TABLET | Freq: Once | ORAL | Status: AC
  Administered 2018-11-03: 5 mg via ORAL

## 2018-11-03 MED ORDER — ACETAMINOPHEN 650 MG RE SUPP: 650.0000 mg | Freq: Four times a day (QID) | RECTAL | Status: DC | PRN

## 2018-11-03 MED ORDER — ISOSORBIDE MONONITRATE ER 30 MG PO TB24
30.0000 mg | ORAL_TABLET | Freq: Every day | ORAL | Status: DC
  Administered 2018-11-04 – 2018-11-07 (×4): 30 mg via ORAL
  Filled 2018-11-03 (×4): qty 1

## 2018-11-03 MED ORDER — ATORVASTATIN CALCIUM 80 MG PO TABS
80.0000 mg | ORAL_TABLET | Freq: Every day | ORAL | Status: DC
  Administered 2018-11-04 – 2018-11-06 (×3): 80 mg via ORAL
  Filled 2018-11-03 (×3): qty 1

## 2018-11-03 MED ORDER — HEPARIN (PORCINE) 25000 UT/250ML-% IV SOLN
1400.0000 [IU]/h | INTRAVENOUS | Status: DC
  Administered 2018-11-03 – 2018-11-04 (×2): 950 [IU]/h via INTRAVENOUS
  Filled 2018-11-03 (×3): qty 250

## 2018-11-03 MED ORDER — ASPIRIN 81 MG PO CHEW: 324.0000 mg | CHEWABLE_TABLET | Freq: Once | ORAL | Status: DC

## 2018-11-03 MED ORDER — CLOPIDOGREL BISULFATE 75 MG PO TABS
75.0000 mg | ORAL_TABLET | Freq: Every day | ORAL | Status: DC
  Administered 2018-11-03 – 2018-11-07 (×5): 75 mg via ORAL
  Filled 2018-11-03 (×5): qty 1

## 2018-11-03 MED ORDER — HEPARIN BOLUS VIA INFUSION
4000.0000 [IU] | Freq: Once | INTRAVENOUS | Status: AC
  Administered 2018-11-03: 4000 [IU] via INTRAVENOUS
  Filled 2018-11-03: qty 4000

## 2018-11-03 MED ORDER — HYDROCODONE-ACETAMINOPHEN 5-325 MG PO TABS: 2.0000 | ORAL_TABLET | Freq: Once | ORAL | Status: DC

## 2018-11-03 MED ORDER — HYDROCODONE-ACETAMINOPHEN 5-325 MG PO TABS
1.0000 | ORAL_TABLET | Freq: Once | ORAL | Status: AC
  Administered 2018-11-03: 1 via ORAL
  Filled 2018-11-03: qty 1

## 2018-11-03 MED ORDER — SODIUM CHLORIDE 0.9 % IV BOLUS
500.0000 mL | Freq: Once | INTRAVENOUS | Status: AC
  Administered 2018-11-03: 500 mL via INTRAVENOUS

## 2018-11-03 MED ORDER — ACETAMINOPHEN 325 MG PO TABS
650.0000 mg | ORAL_TABLET | Freq: Once | ORAL | Status: AC
  Administered 2018-11-03: 650 mg via ORAL
  Filled 2018-11-03: qty 2

## 2018-11-03 MED ORDER — OXYCODONE HCL 5 MG PO TABS
ORAL_TABLET | ORAL | Status: AC
  Filled 2018-11-03: qty 1

## 2018-11-03 NOTE — ED Notes (Signed)
Patient transported to X-ray 

## 2018-11-03 NOTE — ED Triage Notes (Addendum)
Pt from dialysis via ems; around 0630 pt began having sob and cp around 0815 this am; non radiating; central; T 100 temporal w/ ems; endorses nausea; 1 nitro, 324 asa, 1 zofran given PTA ; pain went from  8/10 to 4/10 after nitro; did not receive any dialysis today, last received full dialysis Monday; pt had gauze removed from would on R foot past Friday that pt states was left in from surgery; pt currently denies sob, denies CP; left BKA  CBG 420 150/74 RR 30's 114 sinus tach 96-100% RA

## 2018-11-03 NOTE — Consult Note (Addendum)
Cardiology Consultation:   Patient ID: [name] Weakland Sr. MRN: [id number]; DOB: 1976-03-05  Admit date: 11/03/2018 Date of Consult: 11/03/2018  Primary Care Provider: System, Pcp Not In Primary Cardiologist: Jenkins Rouge, MD  Primary Electrophysiologist:  None    Patient Profile:   [name] Tse Sr. is a 43 y.o. male with a hx of ESRD on HD MWF, h/o RLE DVT, chronic combined systolic and diastolic heart failure, HTN, HLD, DM II, CAD s/p CABG x 3 in 10/2017, and severe PAD s/p L BKA and R partial foot amputation who is being seen today for the evaluation of chest pain and elevated troponin in the setting of fever at the request of Dr. Tomi Bamberger.  History of Present Illness:   [name] is a thin 43 year old male with past medical history of ESRD on HD MWF, h/o RLE DVT, chronic combined systolic and diastolic heart failure, HTN, HLD, DM II, CAD s/p CABG x 3 in 10/2017, and severe PAD s/p L BKA and R partial foot amputation.  He has a history of mild LV dysfunction in November 2007, however echocardiogram by 2011 showed normalization of EF.  Patient was seen in March 2019 for chest pain and found to have severe three-vessel disease and subsequently underwent CABG x3 with LIMA-LAD, SVG-PDA, SVG-OM by Dr. Roxan Hockey on 11/04/2017.  His diabetes has been very poorly controlled at home.  His last office visit with Dr. Roosvelt Harps was on 05/30/2018 at which time he was doing well.  It was recommended for him to follow-up in 1 year.  He was admitted for hematoma of the right calf with possible compartment syndrome in December 2019, he underwent 4 compartment fasciotomy and hematoma evacuation along with amputation of the right fifth toe.  [facility] discomfort.  Troponin was borderline elevated.  It was felt this is more likely demand ischemia.  Echocardiogram obtained on 09/08/2018 showed EF 50 to 55%, severe mitral valve calcification was possible  spinal oscillating density on the posterior annulus, mild MR.  Based on the discharge summary on 09/08/2018, the mobile density was present on the previous March 2019 echocardiogram and may reflect annular motion of the base of the leaflet, suspicion for endocarditis was very low as he was asymptomatic.  Patient eventually underwent right transmetatarsal amputation by Dr. Carlis Abbott on 09/14/2018.  Since the surgery, he has been having recurrent pain in his foot prompting him to seek medical attention in the emergency room 3 more times.  This morning, he presented to his dialysis center, however prior to dialysis can be initiated he was found to have a fever of 101.6 prompting his dialysis center to send him to the emergency room.  According to the patient, he was feeling well prior to that, however when the EMS arrived to pick them up he started having chest pain that lasted about 45 minutes.  The characteristic of the chest pain is very similar to his previous angina from 2019 however he denies any chest pain recently prior to today.  At this point, his chest discomfort has resolved after aspirin and nitroglycerin.  EKG on arrival shows sinus tachycardia with T wave inversion in the anterior leads which is new when compared to the previous EKG in January.  White blood cell count is elevated at 18.4 with elevated neutrophil.  Fever resolved while in the emergency room.  Blood culture has been obtained and currently pending.  Point-of-care troponin came back elevated at  1.97.  Chest x-ray showed possible mild right perihilar infiltrate.  At this point, he is not short of breath nor does he have any cough.  He denies any fever prior to today.  Right foot x-ray was concerning for infection was new subcutaneous emphysema over second through fourth metatarsals.  No evidence of osteomyelitis.  Cardiology has been consulted for elevated troponin.   Past Medical History:  Diagnosis Date   Anemia    Atherosclerosis of  lower extremity (HCC)    Blood clot in vein    right calf   CAD (coronary artery disease)    Cataracts, bilateral    Chronic combined systolic and diastolic heart failure (HCC)    Complication of anesthesia    Depression    ESRD (end stage renal disease) on dialysis ([place])    "MWF [place]" (03/08/2017)   GERD (gastroesophageal reflux disease)    Heart murmur    History of blood transfusion    "related to OR"   Hypertension    Myocardial infarction ([place])     " light"   Nonhealing surgical wound    nonviable tissue   PONV (postoperative nausea and vomiting)    S/P unilateral BKA (below knee amputation), left ([place])    Type II diabetes mellitus ([place])    Wears glasses     Past Surgical History:  Procedure Laterality Date   ABDOMINAL AORTOGRAM N/A 11/02/2016   Procedure: Abdominal Aortogram;  Surgeon: Waynetta Sandy, MD;  Location: [facility];  Service: Cardiovascular;  Laterality: N/A;   ABDOMINAL AORTOGRAM W/LOWER EXTREMITY Right 08/07/2018   Procedure: ABDOMINAL AORTOGRAM W/LOWER EXTREMITY;  Surgeon: [name], MD;  Location: [facility];  Service: Cardiovascular;  Laterality: Right;   AMPUTATION Left 09/27/2013   Procedure: LEFT GREAT TOE AMPUTATION;  Surgeon: [name], MD;  Location: [facility];  Service: Orthopedics;  Laterality: Left;   AMPUTATION Right 08/15/2015   Procedure: Right Great Toe Amputation;  Surgeon: [name], MD;  Location: [facility];  Service: Orthopedics;  Laterality: Right;   AMPUTATION Left 11/05/2016   Procedure: TRANSMETATARSAL AMPUTATION LEFT FOOT;  Surgeon: [name], MD;  Location: [place];  Service: Orthopedics;  Laterality: Left;   AMPUTATION Left 03/11/2017   Procedure: LEFT BELOW KNEE AMPUTATION;  Surgeon: [name], MD;  Location: [facility];  Service: Orthopedics;  Laterality: Left;   AMPUTATION Right 03/11/2017   Procedure: RIGHT 2ND TOE AMPUTATION;  Surgeon: [name], MD;   Location: [facility];  Service: Orthopedics;  Laterality: Right;   AV FISTULA PLACEMENT  left arm   CORONARY ARTERY BYPASS GRAFT N/A 11/04/2017   Procedure: CORONARY ARTERY BYPASS GRAFTING (CABG) x three, using left internal mammary artery and right    leg greater saphenous vein;  Surgeon: Melrose Nakayama, MD;  Location: [facility];  Service: Open Heart Surgery;  Laterality: N/A;   FASCIOTOMY Right 08/07/2018   Procedure: FOUR COMPARTMENT FASCIOTOMY OF RIGHT LOWER LEG;  Surgeon: Rosetta Posner, MD;  Location: [facility];  Service: Vascular;  Laterality: Right;   FASCIOTOMY CLOSURE Right 08/10/2018   Procedure: FASCIOTOMY CLOSURE RIGHT LOWER EXTREMITY;  Surgeon: [name], MD;  Location: [facility];  Service: Vascular;  Laterality: Right;   HEMATOMA EVACUATION Right 08/07/2018   Procedure: EVACUATION HEMATOMA;  Surgeon: Rosetta Posner, MD;  Location[facility];  Service: Vascular;  Laterality: Right;   LEFT HEART CATH AND CORONARY ANGIOGRAPHY N/A 10/31/2017   Procedure: LEFT  HEART CATH AND CORONARY ANGIOGRAPHY;  Surgeon: Troy Sine, MD;  Location: [facility];  Service: Cardiovascular;  Laterality: N/A;   LEFT HEART CATHETERIZATION WITH CORONARY ANGIOGRAM N/A 09/13/2014   Procedure: LEFT HEART CATHETERIZATION WITH CORONARY ANGIOGRAM;  Surgeon: Sinclair Grooms, MD;  Location: [facility];  Service: Cardiovascular;  Laterality: N/A;   LOWER EXTREMITY ANGIOGRAPHY Bilateral 11/02/2016   Procedure: Lower Extremity Angiography;  Surgeon: Waynetta Sandy, MD;  Location: [facility] [facility];  Service: Cardiovascular;  Laterality: Bilateral;   PERIPHERAL VASCULAR ATHERECTOMY Left 11/02/2016   Procedure: Peripheral Vascular Atherectomy;  Surgeon: Waynetta Sandy, MD;  Location: [facility];  Service: Cardiovascular;  Laterality: Left;  PERONEAL   PERIPHERAL VASCULAR ATHERECTOMY Right 08/07/2018   Procedure: PERIPHERAL VASCULAR ATHERECTOMY;  Surgeon: [name], MD;   Location: [place] [facility];  Service: Cardiovascular;  Laterality: Right;  Peroneal artery   PERIPHERAL VASCULAR BALLOON ANGIOPLASTY Right 08/07/2018   Procedure: PERIPHERAL VASCULAR BALLOON ANGIOPLASTY;  Surgeon: [name], MD;  Location: [facility] [facility];  Service: Cardiovascular;  Laterality: Right;  anterior tibial artery   STUMP REVISION Left 01/11/2017   Procedure: Revision Left Transmetatarsal Amputation;  Surgeon: [name], MD;  Location: [place] [facility];  Service: Orthopedics;  Laterality: Left;   TEE WITHOUT CARDIOVERSION N/A 11/04/2017   Procedure: TRANSESOPHAGEAL ECHOCARDIOGRAM (TEE);  Surgeon: Melrose Nakayama, MD;  Location: [facility];  Service: Open Heart Surgery;  Laterality: N/A;   TRANSMETATARSAL AMPUTATION Right 08/10/2018   Procedure: RIGHT FIFTH TOE AMPUTATION;  Surgeon: [name], MD;  Location: [facility];  Service: Vascular;  Laterality: Right;   TRANSMETATARSAL AMPUTATION Right 09/14/2018   Procedure: TRANSMETATARSAL AMPUTATION;  Surgeon: [name], MD;  Location: [facility];  Service: Vascular;  Laterality: Right;     Home Medications:  Prior to Admission medications   Medication Sig Start Date End Date Taking? Authorizing Provider  acetaminophen (TYLENOL) 325 MG tablet Take 2 tablets (650 mg total) by mouth every 4 (four) hours as needed for headache or mild pain. 09/08/18  Yes Geradine Girt, DO  aspirin EC 81 MG tablet Take 1 tablet (81 mg total) by mouth daily. 02/07/18  Yes Burtis Junes, NP  atorvastatin (LIPITOR) 80 MG tablet Take 1 tablet (80 mg total) by mouth daily at 6 PM. 08/17/18  Yes Irene Pap N, DO  carvedilol (COREG) 6.25 MG tablet Take 6.25 mg by mouth daily.   Yes [provider]  clopidogrel (PLAVIX) 75 MG tablet Take 1 tablet (75 mg total) by mouth daily. 02/07/18  Yes Burtis Junes, NP  isosorbide mononitrate (IMDUR) 30 MG 24 hr tablet Take 1 tablet (30 mg total) by mouth daily. 09/08/18  Yes Geradine Girt,  DO  calcitRIOL (ROCALTROL) 0.25 MCG capsule Take 5 capsules (1.25 mcg total) by mouth every Monday, Wednesday, and Friday with hemodialysis. 11/09/17   Elgie Collard, PA-C  insulin glargine (LANTUS) 100 UNIT/ML injection Inject 0.1 mLs (10 Units total) into the skin at bedtime. Patient taking differently: Inject 14 Units into the skin at bedtime.  09/08/18   Geradine Girt, DO  lanthanum (FOSRENOL) 1000 MG chewable tablet Chew 2 tablets (2,000 mg total) by mouth 3 (three) times daily with meals. 08/16/15   [name], MD  multivitamin (RENA-VIT) TABS tablet Take 1 tablet by mouth at bedtime. 08/17/18   Kayleen Memos, DO  ondansetron (ZOFRAN) 4 MG tablet Take 4 mg by  mouth every 6 (six) hours as needed for nausea or vomiting.    [provider]  sertraline (ZOLOFT) 100 MG tablet Take 100 mg by mouth daily.     [provider]    Inpatient Medications: Scheduled Meds:  heparin  4,000 Units Intravenous Once   vancomycin variable dose per unstable renal function (pharmacist dosing)   Does not apply See admin instructions   Continuous Infusions:  [START ON 11/04/2018] ceFEPime (MAXIPIME) IV     heparin     metronidazole 500 mg (11/03/18 1006)   vancomycin     PRN Meds:   Allergies:    Allergies  Allergen Reactions   Coconut Oil Anaphylaxis and Other (See Comments)    Can use topically, allergic to coconut foods     Social History:   Social History   Socioeconomic History   Marital status: Legally Separated    Spouse name: Not on file   Number of children: Not on file   Years of education: Not on file   Highest education level: Not on file  Occupational History   Not on file  Social Needs   Financial resource strain: Not on file   Food insecurity:    Worry: Not on file    Inability: Not on file   Transportation needs:    Medical: Not on file    Non-medical: Not on file  Tobacco Use   Smoking status: Current Every Day Smoker     Packs/day: 0.50    Years: 26.00    Pack years: 13.00    Types: Cigarettes    Last attempt to quit: 08/17/2018    Years since quitting: 0.2   Smokeless tobacco: Never Used  Substance and Sexual Activity   Alcohol use: Not Currently    Comment:  "haven't took a drink in over 5 years"   Drug use: Yes    Types: Marijuana    Comment: occasional   Sexual activity: Yes  Lifestyle   Physical activity:    Days per week: Not on file    Minutes per session: Not on file   Stress: Not on file  Relationships   Social connections:    Talks on phone: Not on file    Gets together: Not on file    Attends religious service: Not on file    Active member of club or organization: Not on file    Attends meetings of clubs or organizations: Not on file    Relationship status: Not on file   Intimate partner violence:    Fear of current or ex partner: Not on file    Emotionally abused: Not on file    Physically abused: Not on file    Forced sexual activity: Not on file  Other Topics Concern   Not on file  Social History Narrative   Not on file    Family History:    Family History  Problem Relation Age of Onset   Heart failure Mother    Hypertension Mother      ROS:  Please see the history of present illness.   All other ROS reviewed and negative.     Physical Exam/Data:   Vitals:   11/03/18 0930 11/03/18 0945 11/03/18 1007 11/03/18 1030  BP: (!) 169/89 (!) 182/77  (!) 187/66  Pulse: (!) 109 (!) 113  (!) 110  Resp: (!) 25 20  18   Temp:   99 F (37.2 C)   TempSrc:   Oral   SpO2:  99% 99%  98%  Weight:      Height:        Intake/Output Summary (Last 24 hours) at 11/03/2018 1042 Last data filed at 11/03/2018 1003 Gross per 24 hour  Intake 100 ml  Output --  Net 100 ml   Last 3 Weights 11/03/2018 10/28/2018 10/13/2018  Weight (lbs) 176 lb 176 lb 176 lb  Weight (kg) 79.833 kg 79.833 kg 79.833 kg     Body mass index is 22.6 kg/m.  General: Thin, cachectic, no acute  distress HEENT: normal Lymph: no adenopathy Neck: no JVD Endocrine:  No thryomegaly Vascular: unable to assess pulse Cardiac: Mildly tachycardic, S1, S2; RRR; no murmur  Lungs:  clear to auscultation bilaterally, no wheezing, rhonchi or rales  Abd: soft, nontender, no hepatomegaly  Ext: no edema Musculoskeletal: Left BKA, right partial foot amputation Skin: warm and dry  Neuro:  CNs 2-12 intact, no focal abnormalities noted Psych:  Normal affect   EKG:  The EKG was personally reviewed and demonstrates: Sinus tachycardia with T wave inversion in anterior leads Telemetry:  Telemetry was personally reviewed and demonstrates: Sinus Tachycardia  Relevant CV Studies:  Echo 09/08/2018 Study Conclusions  - Left ventricle: The cavity size was mildly dilated. Wall   thickness was normal. Systolic function was normal. The estimated   ejection fraction was in the range of 50% to 55%. Wall motion was   normal; there were no regional wall motion abnormalities. Doppler   parameters are consistent with abnormal left ventricular   relaxation (grade 1 diastolic dysfunction). - Mitral valve: Severely calcified annulus. Mildly thickened   leaflets . - Left atrium: The atrium was mildly dilated. - Atrial septum: There was an atrial septal aneurysm.  Impressions:  - Normal LV systolic function; mild LVH; mild diastolic   dysfunction; severe MAC with possible small oscillating density   on posterior annulus (consider blood cultures if clinically   indicated); mild MR.  Laboratory Data:  Chemistry Recent Labs  Lab 10/28/18 2203  NA 130*  K 4.2  CL 92*  CO2 15*  GLUCOSE 148*  BUN 79*  CREATININE 15.35*  CALCIUM 8.1*  GFRNONAA 3*  GFRAA 4*  ANIONGAP 23*    No results for input(s): PROT, ALBUMIN, AST, ALT, ALKPHOS, BILITOT in the last 168 hours. Hematology Recent Labs  Lab 10/28/18 2203 11/03/18 0848  WBC 8.8 18.4*  RBC 5.56 4.54  HGB 14.3 12.1*  HCT 45.0 35.0*  MCV 80.9  77.1*  MCH 25.7* 26.7  MCHC 31.8 34.6  RDW 17.5* 17.8*  PLT 376 426*   Cardiac EnzymesNo results for input(s): TROPONINI in the last 168 hours.  Recent Labs  Lab 11/03/18 0912  TROPIPOC 1.97*    BNPNo results for input(s): BNP, PROBNP in the last 168 hours.  DDimer No results for input(s): DDIMER in the last 168 hours.  Radiology/Studies:  No results found.  Assessment and Plan:   1. Chest pain with elevated troponin:   - Lasted for 45 minutes this morning, symptoms reminiscent of the previous anginal, however this occurred in the setting of fever and elevated white blood cell count.  He is not ambulatory at home therefore unable to assess functional ability.    - Initial point-of-care troponin significantly elevated when compared to the previous demand ischemia level in January 2020.    - Given resolution of chest discomfort, continue IV heparin.  Serial troponin x3.  May consider Myoview or cardiac cath depending on how he is doing and  the level of troponin.  2. Fever: Chest x-ray showed possible right perihilar infiltrate.  Foot x-ray also shows possible infection as well.  He does not have any cough.  Fever was over 101.6 at the dialysis center, this has resolved.  White blood cell count is elevated at 18.4 with elevated neutrophil.    - Previous echocardiogram in January also showed a questionable mitral valve mass, however per discharge note at the time, this was compared with the previous echocardiogram in March 2019 and that there is no change.  We will ask MD to review again.  Suspicion for persistent endocarditis is very low in this case since he has been to the emergency room multiple times since January and white blood cell count has been normal.  3. CAD s/p CABG x3: On aspirin and Lipitor at home  4. PAD s/p L BKA and R partial foot amputation: Since right partial foot amputation in January, he has been in and out of emergency room at least 3 times for right foot  pain.  5. ESRD on HD MWF  6. chronic combined systolic and diastolic heart failure: EF normalized by 2011.  He is euvolemic on physical exam   7. HTN: Blood pressure very high.  Recommend change carvedilol to 6.25 mg twice daily instead of daily.  Restart Imdur  8. HLD: On Lipitor 80 mg daily at home.  9. DM II: Last hemoglobin A1c was 7.8 in June 2019, significantly improved when compared to 12.1 in March 2019.       For questions or updates, please contact [place] Please consult [email or web address] for contact info under     Signed, Almyra Deforest, [place]  11/03/2018 10:42 AM  I have seen and examined the patient along with Almyra Deforest, PA.  I have reviewed the chart, notes and new data.  I agree with PA's note.  Key new complaints: angina has resolved Key examination changes: febrile, no distress Key new findings / data: elevated WBC, abnormal troponin  PLAN: While there is evidence of ischemia and injury, suspect demand injury due to hemodynamic stress of fever/infection. Note that several secondary branches (such as the diagonal artery) were too diseased to be bypassed and there are likely areas of ischemic myocardium during increased hemodynamic load conditions. Doubt true new atherothrombotic event, but it is reasonable to keep on IV heparin overnight and repeat ECG in AM.  Sanda Klein, MD, [facility] [phone] 11/03/2018, 5:32 PM

## 2018-11-03 NOTE — ED Notes (Signed)
Pharmacy messaged about unverified meds 

## 2018-11-03 NOTE — Consult Note (Addendum)
[facility] Renal Consultation Note    Indication for Consultation:  Management of ESRD/hemodialysis; anemia, hypertension/volume and secondary hyperparathyroidism PCP:  HPI: [name] Labella Sr. is a 43 y.o. male with ESRD on hemodialysis MWF at [facility]. PMH significant for DMT2, HTN, CAD S/P CABG, combined systolic and diastolic HF, PAD S/P L BKA, R TMA, SHPT, AOCD. Last HD 10/30/18 left 0.7 under OP EDW. Shortened and missed HD treatments. Has not had full treatment in last 4 treatments.   Patient presented to HD center today for regular HD but was found to have oral temperature 101.6. He was sent to ED per HD center COVID 19 policy. Patient said he felt fine but when EMS arrived he developed SSCP without radiation with associated SOB, nausea, diaphoresis. Chest pain 10/10 relieved with NTG/ASA. Upon arrival to ED, Temperature 101.6 BP 184/63 HR 111 RR 21. O2 Sat 98% on RA. 12 lead EKG ST with T wave inversion V1-V2. WBC 18.4 HGB 12.1 PLT 426 Na 124 BS 362 Co2 16 AG 19 SCr 18.45 K+ 3.8. Troponin 1.97 Lactic acid 1.7. CXR revealed Mild right perihilar infiltrate. Xray R TMA showed soft tissue swelling and new SQ emphysema overlying residual 2nd and 4th metarsals concerning for infection. BC drawn, he has been started on Vanc/Maxepime per primary.   He is awake, alert, no C/O chest pain, still C/O mild SOB. He is lying flat on stretcher. Says his R TMA has been painful for about 2 weeks, was seen in ED 10/29/18 to remove guaze from foot that he thought was causing pain. He said he did not feel sick this AM, denies recent sick contacts other than usual patients at HD center. No travel out of country.   Past Medical History:  Diagnosis Date  . Anemia   . Atherosclerosis of lower extremity ([place])   . Blood clot in vein    right calf  . CAD (coronary artery disease)   . Cataracts, bilateral   . Chronic combined systolic and diastolic heart failure ([place])   .  Complication of anesthesia   . Depression   . ESRD (end stage renal disease) on dialysis [facility])    "MWF [employer]" (03/08/2017)  . GERD (gastroesophageal reflux disease)   . Heart murmur   . History of blood transfusion    "related to OR"  . Hypertension   . Myocardial infarction ([place])     " light"  . Nonhealing surgical wound    nonviable tissue  . PONV (postoperative nausea and vomiting)   . S/P unilateral BKA (below knee amputation), left ([place])   . Type II diabetes mellitus ([place])   . Wears glasses    Past Surgical History:  Procedure Laterality Date  . ABDOMINAL AORTOGRAM N/A 11/02/2016   Procedure: Abdominal Aortogram;  Surgeon: Waynetta Sandy, MD;  Location: [place] [facility];  Service: Cardiovascular;  Laterality: N/A;  . ABDOMINAL AORTOGRAM W/LOWER EXTREMITY Right 08/07/2018   Procedure: ABDOMINAL AORTOGRAM W/LOWER EXTREMITY;  Surgeon: [name], MD;  Location: [place] [facility];  Service: Cardiovascular;  Laterality: Right;  . AMPUTATION Left 09/27/2013   Procedure: LEFT GREAT TOE AMPUTATION;  Surgeon: [name], MD;  Location: [place];  Service: Orthopedics;  Laterality: Left;  . AMPUTATION Right 08/15/2015   Procedure: Right Great Toe Amputation;  Surgeon: [name], MD;  Location: [place];  Service: Orthopedics;  Laterality: Right;  . AMPUTATION Left 11/05/2016   Procedure: TRANSMETATARSAL AMPUTATION LEFT FOOT;  Surgeon: [name], MD;  Location: [place];  Service: Orthopedics;  Laterality: Left;  . AMPUTATION Left 03/11/2017   Procedure: LEFT BELOW KNEE AMPUTATION;  Surgeon: [name], MD;  Location: [place];  Service: Orthopedics;  Laterality: Left;  . AMPUTATION Right 03/11/2017   Procedure: RIGHT 2ND TOE AMPUTATION;  Surgeon: [name], MD;  Location: [place];  Service: Orthopedics;  Laterality: Right;  . AV FISTULA PLACEMENT  left arm  . CORONARY ARTERY BYPASS GRAFT N/A 11/04/2017   Procedure: CORONARY ARTERY BYPASS GRAFTING (CABG) x  three, using left internal mammary artery and right    leg greater saphenous vein;  Surgeon: Melrose Nakayama, MD;  Location: [place];  Service: Open Heart Surgery;  Laterality: N/A;  . FASCIOTOMY Right 08/07/2018   Procedure: FOUR COMPARTMENT FASCIOTOMY OF RIGHT LOWER LEG;  Surgeon: Rosetta Posner, MD;  Location: [place];  Service: Vascular;  Laterality: Right;  . FASCIOTOMY CLOSURE Right 08/10/2018   Procedure: FASCIOTOMY CLOSURE RIGHT LOWER EXTREMITY;  Surgeon: [name], MD;  Location: [place];  Service: Vascular;  Laterality: Right;  . HEMATOMA EVACUATION Right 08/07/2018   Procedure: EVACUATION HEMATOMA;  Surgeon: Rosetta Posner, MD;  Location: [place];  Service: Vascular;  Laterality: Right;  . LEFT HEART CATH AND CORONARY ANGIOGRAPHY N/A 10/31/2017   Procedure: LEFT HEART CATH AND CORONARY ANGIOGRAPHY;  Surgeon: Troy Sine, MD;  Location: [place] [facility];  Service: Cardiovascular;  Laterality: N/A;  . LEFT HEART CATHETERIZATION WITH CORONARY ANGIOGRAM N/A 09/13/2014   Procedure: LEFT HEART CATHETERIZATION WITH CORONARY ANGIOGRAM;  Surgeon: Sinclair Grooms, MD;  Location: [facility];  Service: Cardiovascular;  Laterality: N/A;  . LOWER EXTREMITY ANGIOGRAPHY Bilateral 11/02/2016   Procedure: Lower Extremity Angiography;  Surgeon: Waynetta Sandy, MD;  Location: [place] [facility];  Service: Cardiovascular;  Laterality: Bilateral;  . PERIPHERAL VASCULAR ATHERECTOMY Left 11/02/2016   Procedure: Peripheral Vascular Atherectomy;  Surgeon: Waynetta Sandy, MD;  Location: [place] [facility];  Service: Cardiovascular;  Laterality: Left;  PERONEAL  . PERIPHERAL VASCULAR ATHERECTOMY Right 08/07/2018   Procedure: PERIPHERAL VASCULAR ATHERECTOMY;  Surgeon: [name], MD;  Location: [place] [facility];  Service: Cardiovascular;  Laterality: Right;  Peroneal artery  . PERIPHERAL VASCULAR BALLOON ANGIOPLASTY Right 08/07/2018   Procedure: PERIPHERAL VASCULAR BALLOON  ANGIOPLASTY;  Surgeon: [name], MD;  Location: [place] [facility];  Service: Cardiovascular;  Laterality: Right;  anterior tibial artery  . STUMP REVISION Left 01/11/2017   Procedure: Revision Left Transmetatarsal Amputation;  Surgeon: [name], MD;  Location: [place];  Service: Orthopedics;  Laterality: Left;  . TEE WITHOUT CARDIOVERSION N/A 11/04/2017   Procedure: TRANSESOPHAGEAL ECHOCARDIOGRAM (TEE);  Surgeon: Melrose Nakayama, MD;  Location: [place];  Service: Open Heart Surgery;  Laterality: N/A;  . TRANSMETATARSAL AMPUTATION Right 08/10/2018   Procedure: RIGHT FIFTH TOE AMPUTATION;  Surgeon: [name], MD;  Location: [place];  Service: Vascular;  Laterality: Right;  . TRANSMETATARSAL AMPUTATION Right 09/14/2018   Procedure: TRANSMETATARSAL AMPUTATION;  Surgeon: [name], MD;  Location: [facility] OR;  Service: Vascular;  Laterality: Right;   Family History  Problem Relation Age of Onset  . Heart failure Mother   . Hypertension Mother    Social History:  reports that he has been smoking cigarettes. He has a 13.00 pack-year smoking history. He has never used smokeless tobacco. He reports previous alcohol use. He reports current drug use.  Drug: Marijuana. Allergies  Allergen Reactions  . Coconut Oil Anaphylaxis and Other (See Comments)    Can use topically, allergic to coconut foods    Prior to Admission medications   Medication Sig Start Date End Date Taking? Authorizing Provider  acetaminophen (TYLENOL) 325 MG tablet Take 2 tablets (650 mg total) by mouth every 4 (four) hours as needed for headache or mild pain. 09/08/18  Yes Geradine Girt, DO  aspirin EC 81 MG tablet Take 1 tablet (81 mg total) by mouth daily. 02/07/18  Yes Burtis Junes, NP  atorvastatin (LIPITOR) 80 MG tablet Take 1 tablet (80 mg total) by mouth daily at 6 PM. 08/17/18  Yes Irene Pap N, DO  carvedilol (COREG) 6.25 MG tablet Take 6.25 mg by mouth daily.   Yes [provider]  clopidogrel (PLAVIX) 75 MG tablet Take 1 tablet (75 mg total) by mouth daily. 02/07/18  Yes Burtis Junes, NP  insulin glargine (LANTUS) 100 UNIT/ML injection Inject 0.1 mLs (10 Units total) into the skin at bedtime. Patient taking differently: Inject 14 Units into the skin at bedtime.  09/08/18  Yes Eulogio Bear U, DO  isosorbide mononitrate (IMDUR) 30 MG 24 hr tablet Take 1 tablet (30 mg total) by mouth daily. 09/08/18  Yes Vann, Jessica U, DO  lanthanum (FOSRENOL) 1000 MG chewable tablet Chew 2 tablets (2,000 mg total) by mouth 3 (three) times daily with meals. 08/16/15  Yes Short, Noah Delaine, MD  multivitamin (RENA-VIT) TABS tablet Take 1 tablet by mouth at bedtime. 08/17/18  Yes Hall, Carole N, DO  ondansetron (ZOFRAN) 4 MG tablet Take 4 mg by mouth every 6 (six) hours as needed for nausea or vomiting.   Yes [provider]  calcitRIOL (ROCALTROL) 0.25 MCG capsule Take 5 capsules (1.25 mcg total) by mouth every Monday, Wednesday, and Friday with hemodialysis. 11/09/17   Elgie Collard, PA-C   Current Facility-Administered Medications  Medication Dose Route Frequency Provider Last Rate Last Dose  . [START ON 11/04/2018] ceFEPIme (MAXIPIME) 1 g in sodium chloride 0.9 % 100 mL IVPB  1 g Intravenous Q24H Corinda Gubler, RPH      . heparin ADULT infusion 100 units/mL (25000 units/255mL sodium chloride 0.45%)  950 Units/hr Intravenous Continuous Corinda Gubler, RPH 9.5 mL/hr at 11/03/18 1108 950 Units/hr at 11/03/18 1108  . vancomycin (VANCOCIN) 1,500 mg in sodium chloride 0.9 % 500 mL IVPB  1,500 mg Intravenous Once Corinda Gubler, RPH 250 mL/hr at 11/03/18 1132 1,500 mg at 11/03/18 1132  . vancomycin variable dose per unstable renal function (pharmacist dosing)   Does not apply See admin instructions Corinda Gubler [facility]       Current Outpatient Medications  Medication Sig Dispense Refill  . acetaminophen (TYLENOL) 325 MG tablet Take 2 tablets (650 mg total) by mouth  every 4 (four) hours as needed for headache or mild pain.    Marland Kitchen aspirin EC 81 MG tablet Take 1 tablet (81 mg total) by mouth daily. 30 tablet 11  . atorvastatin (LIPITOR) 80 MG tablet Take 1 tablet (80 mg total) by mouth daily at 6 PM. 30 tablet 0  . carvedilol (COREG) 6.25 MG tablet Take 6.25 mg by mouth daily.    . clopidogrel (PLAVIX) 75 MG tablet Take 1 tablet (75 mg total) by mouth daily. 30 tablet 6  . insulin glargine (LANTUS) 100 UNIT/ML injection Inject 0.1 mLs (10 Units total) into the skin at bedtime. (Patient taking differently: Inject 14  Units into the skin at bedtime. ) 10 mL 11  . isosorbide mononitrate (IMDUR) 30 MG 24 hr tablet Take 1 tablet (30 mg total) by mouth daily. 30 tablet 0  . lanthanum (FOSRENOL) 1000 MG chewable tablet Chew 2 tablets (2,000 mg total) by mouth 3 (three) times daily with meals. 180 tablet 0  . multivitamin (RENA-VIT) TABS tablet Take 1 tablet by mouth at bedtime. 30 tablet 0  . ondansetron (ZOFRAN) 4 MG tablet Take 4 mg by mouth every 6 (six) hours as needed for nausea or vomiting.    . calcitRIOL (ROCALTROL) 0.25 MCG capsule Take 5 capsules (1.25 mcg total) by mouth every Monday, Wednesday, and Friday with hemodialysis. 30 capsule 1   Labs: Basic Metabolic Panel: Recent Labs  Lab 10/28/18 2203 11/03/18 1058  NA 130* 124*  K 4.2 3.8  CL 92* 89*  CO2 15* 16*  GLUCOSE 148* 362*  BUN 79* 81*  CREATININE 15.35* 18.45*  CALCIUM 8.1* 7.7*   Liver Function Tests: Recent Labs  Lab 11/03/18 1058  AST 22  ALT 11  ALKPHOS 72  BILITOT 0.6  PROT 7.7  ALBUMIN 2.7*   No results for input(s): LIPASE, AMYLASE in the last 168 hours. No results for input(s): AMMONIA in the last 168 hours. CBC: Recent Labs  Lab 10/28/18 2203 11/03/18 0848  WBC 8.8 18.4*  NEUTROABS 6.0 15.8*  HGB 14.3 12.1*  HCT 45.0 35.0*  MCV 80.9 77.1*  PLT 376 426*   Cardiac Enzymes: No results for input(s): CKTOTAL, CKMB, CKMBINDEX, TROPONINI in the last 168  hours. CBG: Recent Labs  Lab 10/28/18 2122  GLUCAP 168*   Iron Studies: No results for input(s): IRON, TIBC, TRANSFERRIN, FERRITIN in the last 72 hours. Studies/Results: Dg Chest 2 View  Result Date: 11/03/2018 CLINICAL DATA:  Shortness of breath.  Chest pain. EXAM: CHEST - 2 VIEW COMPARISON:  09/07/2018. FINDINGS: Prior median sternotomy and CABG. Cardiomegaly with normal pulmonary vascularity. Mild right perihilar infiltrate can not be excluded. No pleural effusion or pneumothorax IMPRESSION: 1.  Prior CABG. 2.  Mild right perihilar infiltrate can not be excluded. Electronically Signed   By: Marcello Moores  Register   On: 11/03/2018 10:42   Dg Foot Complete Right  Result Date: 11/03/2018 CLINICAL DATA:  Foot pain.  Fever. EXAM: RIGHT FOOT COMPLETE - 3+ VIEW COMPARISON:  Right foot x-rays dated October 04, 2018. FINDINGS: Postsurgical changes related to transmetatarsal amputation again noted. Surgical margins are maintained. No osseous destruction or periosteal reaction. No acute fracture or dislocation. Joint spaces are preserved. Osteopenia. Soft tissue swelling of the stump with small amount of subcutaneous emphysema overlying the residual second through fourth metatarsals. Vascular calcifications. IMPRESSION: 1. Prior transmetatarsal amputation with stump soft tissue swelling and new subcutaneous emphysema overlying the residual second through fourth metatarsals, concerning for infection. No radiographic evidence of osteomyelitis. Electronically Signed   By: Titus Dubin M.D.   On: 11/03/2018 10:46    ROS: As per HPI otherwise negative.   Physical Exam: Vitals:   11/03/18 1115 11/03/18 1200 11/03/18 1215 11/03/18 1245  BP: (!) 172/98 (!) 198/97 (!) 155/80 (!) 172/83  Pulse: (!) 109 (!) 115 (!) 110 (!) 115  Resp: 13 (!) 27 (!) 25 18  Temp:      TempSrc:      SpO2: 98% 96% 96% 99%  Weight:      Height:         General: Chronically ill appearing male, looks older than stated age in no  acute distress. Head: Normocephalic, atraumatic, sclera non-icteric, mucus membranes are moist Neck: Supple. JVD not elevated. Lungs: Clear bilaterally to auscultation without wheezes, rales, or rhonchi. Breathing is unlabored. Heart: RRR with S1 S2. Tachy HR 110. Abdomen: Soft, non-tender, non-distended with normoactive bowel sounds. No rebound/guarding. No obvious abdominal masses. M-S:  Strength and tone appear normal for age. Lower extremities:L BKA squeezer in place, R TMA edema below suture lines posterior aspect of food with mild erythema/demarcation present. No RLE edema.   Neuro: Alert and oriented X 3. Moves all extremities spontaneously. Psych:  Responds to questions appropriately with a normal affect. Dialysis Access: L AVF + bruit   Home meds:  - carvedilol 6.25 bid  - isosorbide mononitrate 30 qd/ clopidogrel 75mg  qd/ atorvastatin 80 mg / aspirin 81 qd  - insulin glargine 14u hs  - lanthanum 2gm ac/ vitamins/ prn's   [facility] MWF 4 hrs 200NRe 500/800  77.5 kg 2.0 K/ 3.5 Ca UFP 2 L AVF Hep 2500 -Calcitriol 1.25 mcg PO TIW -Venofer 100 mg IV weekly (last dose 10/25/18 Last Tsat 24 10/25/18)   Assessment/Plan: 1.  Chest Pain-Troponin 1.97, T wave inversion anterior leads. Cardiology consulted. On heparin gtt. Chest pain free at present.  2. Pain/color changes R TMA-VVS consulted. Elevated temp/WBC 18.4-started on Vanc/Maxipime per primary. Xray R TMA with soft tissue swelling and new SQ emphysema overlying residual 2nd and 4th metarsals concerning for infection 3. Hyperglycemia-BS 362 AG 19 Co2 16. Per primary.  4.  ESRD -  MWF. No HD since 10/30/18. K+ 3.8. HD today, no heparin.  5.  Hypertension/volume  - Very hypertensive on admission. Home meds resumed. Does not appear volume overloaded by exam or chest xray. Has been leaving under EDW at OP center. May need to challenge EDW. Na 124. Will check pt wt prior to HD.  6.  Anemia  - HGB 12.1. No ESA needed. Hold venofer in setting  of possible sepsis.  7.  Metabolic bone disease -  Ca 7.7 C Ca 8.7. Uses 3.5 Ca bath. Continue binders, VDRA.  8.  Nutrition -Albumin 2.7. NPO at present. Renal/Carb mod diet when eating, prostat, renal vits. 9.  PAD-S/P L BKA, R TMA 10. Combined systolic and diastolic HF-no evidence of volume overload by exam.  11. CAD H/O CABG X 3  Rita H. Owens Shark, NP-C 11/03/2018, 1:07 PM  [employer] [phone]  Pt seen, examined and agree w A/P as above.  [facility] 11/03/2018, 3:45 PM

## 2018-11-03 NOTE — H&P (Signed)
Date: 11/03/2018               Patient Name:  [name] Dupriest Sr. MRN: [id number]  DOB: [birth year] Age / Sex: 43 y.o., male   PCP: System, Pcp Not In         Medical Service: Internal Medicine Teaching Service         Attending Physician: Dr. Rebeca Alert, Raynaldo Opitz, MD    First Contact: Dr. Laural Golden Pager: [phone]  Second Contact: Dr. Philipp Ovens Pager: [phone]       After Hours (After 5p/  First Contact Pager: [phone]  weekends / holidays): Second Contact Pager: [phone]   Chief Complaint: Fever  History of Present Illness: [name] is a 43 year old male with ESRD on HD, chronic combined systolic and diastolic heart failure, HTN, HLD, type II DM, CAD s/p CABG x3 in 2019 and severe PAD s/p left BKA and right partial foot amputation presenting with a fever. He states he was at HD today found to be febrile. He was given tylenol but when he fever did not come down he was called an ambulance to bring him to the ED. He states as he was waiting for EMS he developed sharp, non-radiating substernal chest pain that lasted for 45 minutes, relieved by nitroglycerin and aspirin. He states he has been having dry heaving for the past two days but denies vomiting, abdominal pain, cough, or SOB. He also endorses a 2 day history of diarrhea. He also states he has been having pain in his right foot since the weekend. He came into the ED because of the pain and was found to have some gauze in the wound that was removed. He said he has had pain in his foot since his amputation in January but it was worsened over the last few weeks. He missed HD on Wednesday due to the pain he was experiencing from any pressure on his foot. He lives alone and gets around in his wheelchair and with the help of transportation to HD. He states he has been taking all of his medications.   ED course: On arrival, he was hypertensive and tachycardic. Febrile at 101.6. I-stat troponin was 1.97. He had a leukocytosis of  18.4. Left foot xray showed transmetatarsal amputation with stump soft tissue swelling and new subcutaneous emphysema overlying the residual second through fourth metatarsals, concerning for infection. No radiographic evidence of osteomyelitis. Chest xray illustrated mild right perihilar infiltrate.   Meds:  Current Meds  Medication Sig  . acetaminophen (TYLENOL) 325 MG tablet Take 2 tablets (650 mg total) by mouth every 4 (four) hours as needed for headache or mild pain.  Marland Kitchen aspirin EC 81 MG tablet Take 1 tablet (81 mg total) by mouth daily.  Marland Kitchen atorvastatin (LIPITOR) 80 MG tablet Take 1 tablet (80 mg total) by mouth daily at 6 PM.  . carvedilol (COREG) 6.25 MG tablet Take 6.25 mg by mouth daily.  . clopidogrel (PLAVIX) 75 MG tablet Take 1 tablet (75 mg total) by mouth daily.  . insulin glargine (LANTUS) 100 UNIT/ML injection Inject 0.1 mLs (10 Units total) into the skin at bedtime. (Patient taking differently: Inject 14 Units into the skin at bedtime. )  . isosorbide mononitrate (IMDUR) 30 MG 24 hr tablet Take 1 tablet (30 mg total) by mouth daily.  Marland Kitchen lanthanum (FOSRENOL) 1000 MG chewable tablet Chew 2 tablets (2,000 mg total) by mouth 3 (three) times daily with meals.  . multivitamin (RENA-VIT) TABS  tablet Take 1 tablet by mouth at bedtime.  . ondansetron (ZOFRAN) 4 MG tablet Take 4 mg by mouth every 6 (six) hours as needed for nausea or vomiting.  . [DISCONTINUED] lamoTRIgine (LAMICTAL) 150 MG tablet Take 150 mg by mouth at bedtime.     Allergies: Allergies as of 11/03/2018 - Review Complete 11/03/2018  Allergen Reaction Noted  . Coconut oil Anaphylaxis and Other (See Comments) 09/12/2014   Past Medical History:  Diagnosis Date  . Anemia   . Atherosclerosis of lower extremity ([place])   . Blood clot in vein    right calf  . CAD (coronary artery disease)   . Cataracts, bilateral   . Chronic combined systolic and diastolic heart failure ([place])   . Complication of anesthesia   .  Depression   . ESRD (end stage renal disease) on dialysis [facility])    "MWF [facility]" (03/08/2017)  . GERD (gastroesophageal reflux disease)   . Heart murmur   . History of blood transfusion    "related to OR"  . Hypertension   . Myocardial infarction ([place])     " light"  . Nonhealing surgical wound    nonviable tissue  . PONV (postoperative nausea and vomiting)   . S/P unilateral BKA (below knee amputation), left ([place])   . Type II diabetes mellitus ([place])   . Wears glasses     Family History:  Family History  Problem Relation Age of Onset  . Heart failure Mother   . Hypertension Mother     Social History:  Patient states he lives alone and gets around with a wheelchair. He has been smoking a pack of cigarettes a day since he was 14 but states he quit 5 days ago. He quit drinking alcohol 10 years ago. Smokes marijuana about 2x a week.   Social History   Socioeconomic History  . Marital status: Legally Separated    Spouse name: Not on file  . Number of children: Not on file  . Years of education: Not on file  . Highest education level: Not on file  Occupational History  . Not on file  Social Needs  . Financial resource strain: Not on file  . Food insecurity:    Worry: Not on file    Inability: Not on file  . Transportation needs:    Medical: Not on file    Non-medical: Not on file  Tobacco Use  . Smoking status: Current Every Day Smoker    Packs/day: 0.50    Years: 26.00    Pack years: 13.00    Types: Cigarettes    Last attempt to quit: 08/17/2018    Years since quitting: 0.2  . Smokeless tobacco: Never Used  Substance and Sexual Activity  . Alcohol use: Not Currently    Comment:  "haven't took a drink in over 5 years"  . Drug use: Yes    Types: Marijuana    Comment: occasional  . Sexual activity: Yes  Lifestyle  . Physical activity:    Days per week: Not on file    Minutes per session: Not on file  . Stress: Not on file  Relationships  . Social  connections:    Talks on phone: Not on file    Gets together: Not on file    Attends religious service: Not on file    Active member of club or organization: Not on file    Attends meetings of clubs or organizations: Not on file    Relationship  status: Not on file  . Intimate partner violence:    Fear of current or ex partner: Not on file    Emotionally abused: Not on file    Physically abused: Not on file    Forced sexual activity: Not on file  Other Topics Concern  . Not on file  Social History Narrative  . Not on file    Review of Systems: A complete ROS was negative except as per HPI.   Physical Exam: Blood pressure (!) 187/64, pulse (!) 109, temperature 99 F (37.2 C), temperature source Oral, resp. rate 18, height _0  (1.88 m), weight 79.8 kg, SpO2 99 %.  Physical Exam  Constitutional: He is oriented to person, place, and time and well-developed, well-nourished, and in no distress.  Cardiovascular: Regular rhythm, normal heart sounds and intact distal pulses. Tachycardia present.  No murmur heard. Pulmonary/Chest: Effort normal and breath sounds normal. No respiratory distress. He has no wheezes. He has no rales.  Abdominal: Soft. Bowel sounds are normal. He exhibits no distension. There is no abdominal tenderness. There is no rebound.  Musculoskeletal:        General: No tenderness or edema.     Comments: Right transmetatarsal amputation mildly erythematous, incision intact, no drainage. Left BKA  Neurological: He is alert and oriented to person, place, and time.  Skin: Skin is warm and dry. No erythema.  Multiple hyperpigmented skin lesions on upper chest, back and bilateral UE  Psychiatric: Mood, memory, affect and judgment normal.    EKG: personally reviewed my interpretation is sinus tachycardia, T wave inversions in lead II and III   CXR: personally reviewed my interpretation is mild right perihilar infiltrate   Assessment & Plan by Problem: Active Problems:    Chest pain  Mr. Valente Fosberg is a 43 year old male with ESRD on HD, chronic combined systolic and diastolic heart failure, HTN, HLD, type II DM, CAD s/p CABG x3 in 2019 and severe PAD s/p left BKA and right partial foot amputation presenting with a fever, chest pain and right transmetatarsal amputation foot pain.   Chest Pain CAD s/p CABG x3: Patient presented from HD due to a fever; however, while waiting on EMS he had a 45 min episode of non-radiating sharp substernal chest pain that was relieved after nitroglycerin and aspirin were administered. EKG showed sinus tachycardia with T wave inversions in lead II and III. I-stat troponin was 1.97. He was started on IV heparin. Cardiology consulted, appreciate recommendations.  - Serial troponin x3 - Repeat EKG in the am - Cards is considering myoview vs cardiac cath  - Continue IV heparin   Fever Severe PAD s/p left BKA and right partial foot amputation: Patient presented from HD due to a fever, 101.6 on admission. Foot xray showed possible infection. Chest xray showed mild right perihilar infiltrate. He was at the ED on 3/14 to remove gauze that was in his right transmetatarsal amputation. He reports worsening pain over the past few months. [facility] was consulted, appreciate recommendations.  - Continue cefepime and vancomycin  - Follow up blood cultures   ESRD on HD (MWF): Last session was on Monday. He missed Wednesday due to pain in his right foot and did not complete HD today due to fever. Nephro consulted for inpatient HD, appreciate recommendations.   Chronic Combined Systolic and Diastolic Heart Failure: Most recent echo in January 2020 showed EF 50-55%, no regional wall abnormalities, grade 1 diastolic dysfunction, mild LVH, severe MAC with possible small  oscillating density on posterior annulus (consistent with prior echo in 2019) and mild MR. Euvolemic on exam  HTN: Hypertensive on admission - Cards recommended changing carvedilol to  6.25 mg BID instead of qd and to restart Imdur   Type II DM: Last Hgb A1c 7.8 in June 2019.  - Lantus 8 units qd  - SSI sensitive  - CBG monitoring   Diet: NPO VTE ppx: Heparin  Full Code  Dispo: Admit patient to Inpatient with expected length of stay greater than 2 midnights.  SignedMike Craze, DO 11/03/2018, 1:44 PM  Pager: [phone]

## 2018-11-03 NOTE — ED Provider Notes (Addendum)
[facility] EMERGENCY DEPARTMENT Provider Note   CSN: [id number] Arrival date & time: 11/03/18  7902    History   Chief Complaint Chief Complaint  Patient presents with  . Foot Pain  . Wound Infection    HPI [name] Rafalski Sr. is a 43 y.o. male.  HPI Patient presents to the emergency room for evaluation of fever.  Patient has a history of multiple medical problems as indicated below.  He recently was in the hospital for a foot infection.  Patient has history of partial amputation of his foot associated with gangrene and infection.  He had a trans-metatarsal amputation of his right foot on January 30 of this year..  Patient has had some persistent pain and discomfort in his foot since then.  Patient has noted significant pain in his foot today.  He went to dialysis today as his last dialysis treatment was Monday and when they were checking his vital signs noted he had a fever.  Patient was then sent to the ED for evaluation.  Patient did have some discomfort in his chest when EMS arrived.  He was given nitroglycerin and aspirin with some improvement.  Patient denies have any trouble with cough.  He a denies any trouble with vomiting or diarrhea.  He has not noticed any bandage from his wound. Past Medical History:  Diagnosis Date  . Anemia   . Atherosclerosis of lower extremity ([place])   . Blood clot in vein    right calf  . CAD (coronary artery disease)   . Cataracts, bilateral   . Chronic combined systolic and diastolic heart failure ([place])   . Complication of anesthesia   . Depression   . ESRD (end stage renal disease) on dialysis [facility])    "MWF [employer]" (03/08/2017)  . GERD (gastroesophageal reflux disease)   . Heart murmur   . History of blood transfusion    "related to OR"  . Hypertension   . Myocardial infarction ([place])     " light"  . Nonhealing surgical wound    nonviable tissue  . PONV (postoperative nausea and vomiting)   . S/P unilateral  BKA (below knee amputation), left ([place])   . Type II diabetes mellitus ([place])   . Wears glasses     Patient Active Problem List   Diagnosis Date Noted  . Non-healing surgical wound 09/14/2018  . Chest pain, rule out acute myocardial infarction 09/07/2018  . Gangrene of toe of left foot ([place]) 08/05/2018  . 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 Gangrene of foot ([place]) 08/05/2018  . 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 ACS (acute coronary syndrome) ([place]) 10/28/2017  . Flash pulmonary edema ([place]) 06/04/2017  . Hypertensive emergency 06/04/2017  . 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 S/P BKA (below knee amputation) unilateral, left ([place])   . Post-operative pain   . Acute blood loss anemia   . Chronic combined systolic and diastolic CHF (congestive heart failure) ([place])   . PVD (peripheral vascular disease) ([place])   . Coronary artery disease involving native coronary artery of native heart with unstable angina pectoris ([place])   . Tobacco abuse   . Diabetic foot infection ([place]) 03/08/2017  . Osteomyelitis ([place]) 03/08/2017  . Wound dehiscence   . Dehiscence of amputation stump ([place]) 01/04/2017  . Gangrene of right foot (  [place]) 08/02/2016  . Achilles tendon contracture, left 08/02/2016  . Anemia of renal disease 08/16/2015  . Wound infection 08/15/2015  . Diabetic ulcer of right great toe ([place]) 08/15/2015  . Pain in the chest   . Elevated troponin   . Essential hypertension   . ESRD on dialysis ([place]) 08/07/2013  . Chest pain 05/16/2012  . Murmur 05/16/2012  . Renal disorder     Past Surgical History:  Procedure Laterality Date  . ABDOMINAL AORTOGRAM N/A 11/02/2016   Procedure: Abdominal Aortogram;  Surgeon: Waynetta Sandy, MD;  Location: [facility] CV LAB;  Service: Cardiovascular;  Laterality: N/A;  . ABDOMINAL AORTOGRAM  W/LOWER EXTREMITY Right 08/07/2018   Procedure: ABDOMINAL AORTOGRAM W/LOWER EXTREMITY;  Surgeon: [name], MD;  Location: [facility] CV LAB;  Service: Cardiovascular;  Laterality: Right;  . AMPUTATION Left 09/27/2013   Procedure: LEFT GREAT TOE AMPUTATION;  Surgeon: [name], MD;  Location: [facility];  Service: Orthopedics;  Laterality: Left;  . AMPUTATION Right 08/15/2015   Procedure: Right Great Toe Amputation;  Surgeon: [name], MD;  Location: [facility];  Service: Orthopedics;  Laterality: Right;  . AMPUTATION Left 11/05/2016   Procedure: TRANSMETATARSAL AMPUTATION LEFT FOOT;  Surgeon: [name], MD;  Location: [facility];  Service: Orthopedics;  Laterality: Left;  . AMPUTATION Left 03/11/2017   Procedure: LEFT BELOW KNEE AMPUTATION;  Surgeon: [name], MD;  Location: [facility];  Service: Orthopedics;  Laterality: Left;  . AMPUTATION Right 03/11/2017   Procedure: RIGHT 2ND TOE AMPUTATION;  Surgeon: [name], MD;  Location: [facility];  Service: Orthopedics;  Laterality: Right;  . AV FISTULA PLACEMENT  left arm  . CORONARY ARTERY BYPASS GRAFT N/A 11/04/2017   Procedure: CORONARY ARTERY BYPASS GRAFTING (CABG) x three, using left internal mammary artery and right    leg greater saphenous vein;  Surgeon: Melrose Nakayama, MD;  Location: [facility];  Service: Open Heart Surgery;  Laterality: N/A;  . FASCIOTOMY Right 08/07/2018   Procedure: FOUR COMPARTMENT FASCIOTOMY OF RIGHT LOWER LEG;  Surgeon: Rosetta Posner, MD;  Location: [place] [facility];  Service: Vascular;  Laterality: Right;  . FASCIOTOMY CLOSURE Right 08/10/2018   Procedure: FASCIOTOMY CLOSURE RIGHT LOWER EXTREMITY;  Surgeon: [name], MD;  Location: [facility];  Service: Vascular;  Laterality: Right;  . HEMATOMA EVACUATION Right 08/07/2018   Procedure: EVACUATION HEMATOMA;  Surgeon: Rosetta Posner, MD;  Location: [place];  Service: Vascular;  Laterality: Right;  . LEFT HEART CATH AND CORONARY ANGIOGRAPHY N/A 10/31/2017   Procedure:  LEFT HEART CATH AND CORONARY ANGIOGRAPHY;  Surgeon: Troy Sine, MD;  Location: [facility] CV LAB;  Service: Cardiovascular;  Laterality: N/A;  . LEFT HEART CATHETERIZATION WITH CORONARY ANGIOGRAM N/A 09/13/2014   Procedure: LEFT HEART CATHETERIZATION WITH CORONARY ANGIOGRAM;  Surgeon: Sinclair Grooms, MD;  Location: [facility] CATH LAB;  Service: Cardiovascular;  Laterality: N/A;  . LOWER EXTREMITY ANGIOGRAPHY Bilateral 11/02/2016   Procedure: Lower Extremity Angiography;  Surgeon: Waynetta Sandy, MD;  Location: [place] CV LAB;  Service: Cardiovascular;  Laterality: Bilateral;  . PERIPHERAL VASCULAR ATHERECTOMY Left 11/02/2016   Procedure: Peripheral Vascular Atherectomy;  Surgeon: Waynetta Sandy, MD;  Location: [facility] CV LAB;  Service: Cardiovascular;  Laterality: Left;  PERONEAL  . PERIPHERAL VASCULAR ATHERECTOMY Right 08/07/2018   Procedure: PERIPHERAL VASCULAR ATHERECTOMY;  Surgeon: [name], MD;  Location: [facility] LAB;  Service: Cardiovascular;  Laterality: Right;  Peroneal  artery  . PERIPHERAL VASCULAR BALLOON ANGIOPLASTY Right 08/07/2018   Procedure: PERIPHERAL VASCULAR BALLOON ANGIOPLASTY;  Surgeon: [name], MD;  Location: [facility] CV LAB;  Service: Cardiovascular;  Laterality: Right;  anterior tibial artery  . STUMP REVISION Left 01/11/2017   Procedure: Revision Left Transmetatarsal Amputation;  Surgeon: [name], MD;  Location: [facility];  Service: Orthopedics;  Laterality: Left;  . TEE WITHOUT CARDIOVERSION N/A 11/04/2017   Procedure: TRANSESOPHAGEAL ECHOCARDIOGRAM (TEE);  Surgeon: Melrose Nakayama, MD;  Location: [facility];  Service: Open Heart Surgery;  Laterality: N/A;  . TRANSMETATARSAL AMPUTATION Right 08/10/2018   Procedure: RIGHT FIFTH TOE AMPUTATION;  Surgeon: [name], MD;  Location: [facility];  Service: Vascular;  Laterality: Right;  . TRANSMETATARSAL AMPUTATION Right 09/14/2018   Procedure: TRANSMETATARSAL  AMPUTATION;  Surgeon: [name], MD;  Location: [place] [facility];  Service: Vascular;  Laterality: Right;        Home Medications    Prior to Admission medications   Medication Sig Start Date End Date Taking? Authorizing Provider  acetaminophen (TYLENOL) 325 MG tablet Take 2 tablets (650 mg total) by mouth every 4 (four) hours as needed for headache or mild pain. 09/08/18  Yes Geradine Girt, DO  aspirin EC 81 MG tablet Take 1 tablet (81 mg total) by mouth daily. 02/07/18  Yes Burtis Junes, NP  atorvastatin (LIPITOR) 80 MG tablet Take 1 tablet (80 mg total) by mouth daily at 6 PM. 08/17/18  Yes Irene Pap N, DO  carvedilol (COREG) 6.25 MG tablet Take 6.25 mg by mouth daily.   Yes [provider]  clopidogrel (PLAVIX) 75 MG tablet Take 1 tablet (75 mg total) by mouth daily. 02/07/18  Yes Burtis Junes, NP  insulin glargine (LANTUS) 100 UNIT/ML injection Inject 0.1 mLs (10 Units total) into the skin at bedtime. Patient taking differently: Inject 14 Units into the skin at bedtime.  09/08/18  Yes Eulogio Bear U, DO  isosorbide mononitrate (IMDUR) 30 MG 24 hr tablet Take 1 tablet (30 mg total) by mouth daily. 09/08/18  Yes Vann, Jessica U, DO  lanthanum (FOSRENOL) 1000 MG chewable tablet Chew 2 tablets (2,000 mg total) by mouth 3 (three) times daily with meals. 08/16/15  Yes Short, Noah Delaine, MD  multivitamin (RENA-VIT) TABS tablet Take 1 tablet by mouth at bedtime. 08/17/18  Yes Hall, Carole N, DO  ondansetron (ZOFRAN) 4 MG tablet Take 4 mg by mouth every 6 (six) hours as needed for nausea or vomiting.   Yes [provider]  calcitRIOL (ROCALTROL) 0.25 MCG capsule Take 5 capsules (1.25 mcg total) by mouth every Monday, Wednesday, and Friday with hemodialysis. 11/09/17   Elgie Collard, PA-C    Family History Family History  Problem Relation Age of Onset  . Heart failure Mother   . Hypertension Mother     Social History Social History   Tobacco Use  . Smoking status:  Current Every Day Smoker    Packs/day: 0.50    Years: 26.00    Pack years: 13.00    Types: Cigarettes    Last attempt to quit: 08/17/2018    Years since quitting: 0.2  . Smokeless tobacco: Never Used  Substance Use Topics  . Alcohol use: Not Currently    Comment:  "haven't took a drink in over 5 years"  . Drug use: Yes    Types: Marijuana    Comment: occasional     Allergies   Coconut oil   Review  of Systems Review of Systems  All other systems reviewed and are negative.    Physical Exam Updated Vital Signs BP (!) 155/80   Pulse (!) 110   Temp 99 F (37.2 C) (Oral)   Resp (!) 25   Ht 1.88 m (6\' 2" )   Wt 79.8 kg   SpO2 96%   BMI 22.60 kg/m   Physical Exam Vitals signs and nursing note reviewed.  Constitutional:      General: He is not in acute distress.    Appearance: He is well-developed.  HENT:     Head: Normocephalic and atraumatic.     Right Ear: External ear normal.     Left Ear: External ear normal.  Eyes:     General: No scleral icterus.       Right eye: No discharge.        Left eye: No discharge.     Conjunctiva/sclera: Conjunctivae normal.  Neck:     Musculoskeletal: Neck supple.     Trachea: No tracheal deviation.  Cardiovascular:     Rate and Rhythm: Regular rhythm. Tachycardia present.  Pulmonary:     Effort: Pulmonary effort is normal. No respiratory distress.     Breath sounds: Normal breath sounds. No stridor. No wheezing or rales.  Abdominal:     General: Bowel sounds are normal. There is no distension.     Palpations: Abdomen is soft.     Tenderness: There is no abdominal tenderness. There is no guarding or rebound.  Musculoskeletal:        General: No tenderness.     Comments: Status post partial amputation of the right foot, some superficial skin breakdown in a few areas but the wound margins themselves are not erythematous, no purulent drainage, no fluctuance, the area is tender to the touch, increased warmth, mild erythema, no  lymphangitic streaking  Skin:    General: Skin is warm and dry.     Findings: No rash.  Neurological:     Mental Status: He is alert.     Cranial Nerves: No cranial nerve deficit (no facial droop, extraocular movements intact, no slurred speech).     Sensory: No sensory deficit.     Motor: No abnormal muscle tone or seizure activity.     Coordination: Coordination normal.      ED Treatments / Results  Labs (all labs ordered are listed, but only abnormal results are displayed) Labs Reviewed  CBC WITH DIFFERENTIAL/PLATELET - Abnormal; Notable for the following components:      Result Value   WBC 18.4 (*)    Hemoglobin 12.1 (*)    HCT 35.0 (*)    MCV 77.1 (*)    RDW 17.8 (*)    Platelets 426 (*)    Neutro Abs 15.8 (*)    Abs Immature Granulocytes 0.11 (*)    All other components within normal limits  COMPREHENSIVE METABOLIC PANEL - Abnormal; Notable for the following components:   Sodium 124 (*)    Chloride 89 (*)    CO2 16 (*)    Glucose, Bld 362 (*)    BUN 81 (*)    Creatinine, Ser 18.45 (*)    Calcium 7.7 (*)    Albumin 2.7 (*)    GFR calc non Af Amer 3 (*)    GFR calc Af Amer 3 (*)    Anion gap 19 (*)    All other components within normal limits  I-STAT TROPONIN, ED - Abnormal; Notable for the following  components:   Troponin i, poc 1.97 (*)    All other components within normal limits  CULTURE, BLOOD (ROUTINE X 2)  CULTURE, BLOOD (ROUTINE X 2)  LACTIC ACID, PLASMA  URINALYSIS, ROUTINE W REFLEX MICROSCOPIC  HEPARIN LEVEL (UNFRACTIONATED)  COMPREHENSIVE METABOLIC PANEL    EKG EKG Interpretation  Date/Time:  Friday November 03 2018 08:30:46 EDT Ventricular Rate:  117 PR Interval:    QRS Duration: 106 QT Interval:  405 QTC Calculation: 566 R Axis:   36 Text Interpretation:  Sinus tachycardia Ventricular premature complex Abnrm T, consider ischemia, anterolateral lds Prolonged QT interval Baseline wander in lead(s) II aVR aVF Since last tracing rate faster  Confirmed by Dorie Rank [phone]) on 11/03/2018 8:50:56 AM   Radiology Dg Chest 2 View  Result Date: 11/03/2018 CLINICAL DATA:  Shortness of breath.  Chest pain. EXAM: CHEST - 2 VIEW COMPARISON:  09/07/2018. FINDINGS: Prior median sternotomy and CABG. Cardiomegaly with normal pulmonary vascularity. Mild right perihilar infiltrate can not be excluded. No pleural effusion or pneumothorax IMPRESSION: 1.  Prior CABG. 2.  Mild right perihilar infiltrate can not be excluded. Electronically Signed   By: Marcello Moores  Register   On: 11/03/2018 10:42   Dg Foot Complete Right  Result Date: 11/03/2018 CLINICAL DATA:  Foot pain.  Fever. EXAM: RIGHT FOOT COMPLETE - 3+ VIEW COMPARISON:  Right foot x-rays dated October 04, 2018. FINDINGS: Postsurgical changes related to transmetatarsal amputation again noted. Surgical margins are maintained. No osseous destruction or periosteal reaction. No acute fracture or dislocation. Joint spaces are preserved. Osteopenia. Soft tissue swelling of the stump with small amount of subcutaneous emphysema overlying the residual second through fourth metatarsals. Vascular calcifications. IMPRESSION: 1. Prior transmetatarsal amputation with stump soft tissue swelling and new subcutaneous emphysema overlying the residual second through fourth metatarsals, concerning for infection. No radiographic evidence of osteomyelitis. Electronically Signed   By: Titus Dubin M.D.   On: 11/03/2018 10:46    Procedures .Critical Care Performed by: Dorie Rank, MD Authorized by: Dorie Rank, MD   Critical care provider statement:    Critical care time (minutes):  45   Critical care was time spent personally by me on the following activities:  Discussions with consultants, evaluation of patient's response to treatment, examination of patient, ordering and performing treatments and interventions, ordering and review of laboratory studies, ordering and review of radiographic studies, pulse oximetry,  re-evaluation of patient's condition, obtaining history from patient or surrogate and review of old charts   (including critical care time)  Medications Ordered in ED Medications  vancomycin (VANCOCIN) 1,500 mg in sodium chloride 0.9 % 500 mL IVPB (1,500 mg Intravenous New Bag/Given 11/03/18 1132)  vancomycin variable dose per unstable renal function (pharmacist dosing) (has no administration in time range)  ceFEPIme (MAXIPIME) 1 g in sodium chloride 0.9 % 100 mL IVPB (has no administration in time range)  heparin ADULT infusion 100 units/mL (25000 units/247mL sodium chloride 0.45%) (950 Units/hr Intravenous New Bag/Given 11/03/18 1108)  sodium chloride 0.9 % bolus 500 mL (0 mLs Intravenous Stopped 11/03/18 1142)  acetaminophen (TYLENOL) tablet 650 mg (650 mg Oral Given 11/03/18 0908)  ceFEPIme (MAXIPIME) 2 g in sodium chloride 0.9 % 100 mL IVPB (0 g Intravenous Stopped 11/03/18 1003)  metroNIDAZOLE (FLAGYL) IVPB 500 mg (0 mg Intravenous Stopped 11/03/18 1129)  morphine 4 MG/ML injection 4 mg (4 mg Intravenous Given 11/03/18 0939)  heparin bolus via infusion 4,000 Units (4,000 Units Intravenous Bolus from Bag 11/03/18 1111)  morphine 4 MG/ML injection  4 mg (4 mg Intravenous Given 11/03/18 1214)     Initial Impression / Assessment and Plan / ED Course  I have reviewed the triage vital signs and the nursing notes.  Pertinent labs & imaging results that were available during my care of the patient were reviewed by me and considered in my medical decision making (see chart for details).  Clinical Course as of Nov 03 1238  Fri Nov 03, 2018  0930 Pt denies any chest pain.  Complaining of pain in his foot.   [JK]  [id number] D/w Dr Burt Knack.  Will consult on patient.  Likely demand ischemia with his acute infection.     [JK]  7543 Patient's white blood cell count is elevated.  M concerned that his source of infection may be related to his foot.  He denies any coughing or congestion.  No rashes noted.  He has  not had any trips or travel.  No known ill contacts/covid exposure   [JK]  1110 Foot xray shows subq emphysema.  C/w source of infection.  Broad spectrum abx previously ordered.  I will consult vasc surgery   [JK]  1122 D/w Dr Trula Slade.  Will consult on patient.   [JK]  1240 D/w Dr Jonnie Finner, regarding pt's admission and last dialysis was Monday.   [JK]    Clinical Course User Index [JK] Dorie Rank, MD     Patient presented to the emergency room for evaluation of fever and as he was being transported to the emergency department he developed chest pain.  Patient has a very complex medical history.  He unfortunately appears to have a recurrent cellulitis and deeper space tissue foot infection in the same foot that he had a prior amputation.   Patient has not had any issues with cough or congestion.  I doubt pneumonia.  Patient remains hypertensive and has a normal lactic acid level.  I doubt severe sepsis.  Patient's troponin is also significantly elevated.  Possibly demand ischemia associated with his infection.  He denies any chest pain at this time.  Cardiology  has been consulted.  I have spoken with Dr. Trula Slade vascular surgery.  No plans for any acute cardiac or surgical interventions at this time.  Continue medical management.  I will consult the medical service for admission. Final Clinical Impressions(s) / ED Diagnoses   Final diagnoses:  Wound infection  NSTEMI (non-ST elevated myocardial infarction) ([place])  Stage 5 chronic kidney disease on chronic dialysis [facility])      Dorie Rank, MD 11/03/18 1240

## 2018-11-03 NOTE — Progress Notes (Signed)
Pharmacy Antibiotic Note  [name]. is a 43 y.o. male admitted on 11/03/2018 with sepsis.  Pharmacy has been consulted for Vanc and Cefepime dosing.  Plan: Vanc 1500 mg IV x 1, then variable dosing by pharmacist based on dialysis. Cefepime 2 gms IV x 1, then 1 gm IV q24hr Monitor renal function/dialysis schedule, C&S and vanc levels as needed   Height: 6\' 2"  (188 cm) Weight: 176 lb (79.8 kg) IBW/kg (Calculated) : 82.2  Temp (24hrs), Avg:101.6 F (38.7 C), Min:101.6 F (38.7 C), Max:101.6 F (38.7 C)  Recent Labs  Lab 10/28/18 2203  WBC 8.8  CREATININE 15.35*    Estimated Creatinine Clearance: 7.1 mL/min (A) (by C-G formula based on SCr of 15.35 mg/dL (H)).    Allergies  Allergen Reactions  . Coconut Oil Anaphylaxis and Other (See Comments)    Can use topically, allergic to coconut foods     Antimicrobials this admission: Vanc 3/20 >>  Cefepime 3/20 >>   Thank you for allowing pharmacy to be a part of this patient's care.  Alanda Slim, PharmD, [facility] Clinical Pharmacist Please see AMION for all Pharmacists' Contact Phone Numbers 11/03/2018, 9:12 AM

## 2018-11-03 NOTE — Consult Note (Addendum)
[name] NOTE   MRN : [id number]  Reason for Consult: Right foot pain s/p TMA Referring Physician: ED  History of Present Illness: 43 y/o male admitted with CC of CP with elevated troponin .  We are consulted for right foot pain and skin changes s/p TMA on 09/14/2018 by Dr. Carlis Abbott.  There was discussion that he was at high risk of not healing the TMA and the possibility of further proximal amputation in the future.    S/P angiogram 08/07/2018 for right LE ischemia showed   His right common femoral, profunda, and SFA were heavily calcified but all widely patent.  His above and below-knee popliteal artery was also widely patent.  He had single-vessel runoff via the peroneal artery with a collateral at the ankle that filled the dorsalis pedis.  His posterior tibial artery was diminutive proximally and then occluded in the mid calf with no evidence of distal reconstitution.  His anterior tibial artery was patent in the proximal to mid calf that had several segments of chronic total occlusion and then reconstituted in the foot as the dorsalis pedis but the dorsalis pedis was very small at less than 1 mm. After retrograde cannulation of his anterior tibial artery at the ankle could not cross the chronic total occlusions of the anterior tibial either.  Ultimately elected to perform atherectomy of the peroneal artery since this was his only runoff using embolic protection filter distally.     Current Facility-Administered Medications  Medication Dose Route Frequency Provider Last Rate Last Dose  . [START ON 11/04/2018] ceFEPIme (MAXIPIME) 1 g in sodium chloride 0.9 % 100 mL IVPB  1 g Intravenous Q24H Corinda Gubler, RPH      . heparin ADULT infusion 100 units/mL (25000 units/238mL sodium chloride 0.45%)  950 Units/hr Intravenous Continuous Corinda Gubler, RPH 9.5 mL/hr at 11/03/18 1108 950 Units/hr at 11/03/18 1108  . vancomycin (VANCOCIN) 1,500 mg in sodium  chloride 0.9 % 500 mL IVPB  1,500 mg Intravenous Once Corinda Gubler, RPH 250 mL/hr at 11/03/18 1132 1,500 mg at 11/03/18 1132  . vancomycin variable dose per unstable renal function (pharmacist dosing)   Does not apply See admin instructions Corinda Gubler [facility]       Current Outpatient Medications  Medication Sig Dispense Refill  . acetaminophen (TYLENOL) 325 MG tablet Take 2 tablets (650 mg total) by mouth every 4 (four) hours as needed for headache or mild pain.    Marland Kitchen aspirin EC 81 MG tablet Take 1 tablet (81 mg total) by mouth daily. 30 tablet 11  . atorvastatin (LIPITOR) 80 MG tablet Take 1 tablet (80 mg total) by mouth daily at 6 PM. 30 tablet 0  . carvedilol (COREG) 6.25 MG tablet Take 6.25 mg by mouth daily.    . clopidogrel (PLAVIX) 75 MG tablet Take 1 tablet (75 mg total) by mouth daily. 30 tablet 6  . insulin glargine (LANTUS) 100 UNIT/ML injection Inject 0.1 mLs (10 Units total) into the skin at bedtime. (Patient taking differently: Inject 14 Units into the skin at bedtime. ) 10 mL 11  . isosorbide mononitrate (IMDUR) 30 MG 24 hr tablet Take 1 tablet (30 mg total) by mouth daily. 30 tablet 0  . lanthanum (FOSRENOL) 1000 MG chewable tablet Chew 2 tablets (2,000 mg total) by mouth 3 (three) times daily with meals. 180 tablet 0  . multivitamin (RENA-VIT) TABS tablet Take 1 tablet by mouth at bedtime. 30 tablet 0  .  ondansetron (ZOFRAN) 4 MG tablet Take 4 mg by mouth every 6 (six) hours as needed for nausea or vomiting.    . calcitRIOL (ROCALTROL) 0.25 MCG capsule Take 5 capsules (1.25 mcg total) by mouth every Monday, Wednesday, and Friday with hemodialysis. 30 capsule 1      Past Medical History:  Diagnosis Date  . Anemia   . Atherosclerosis of lower extremity ([place])   . Blood clot in vein    right calf  . CAD (coronary artery disease)   . Cataracts, bilateral   . Chronic combined systolic and diastolic heart failure ([place])   . Complication of anesthesia   . Depression    . ESRD (end stage renal disease) on dialysis [facility])    "MWF [employer]" (03/08/2017)  . GERD (gastroesophageal reflux disease)   . Heart murmur   . History of blood transfusion    "related to OR"  . Hypertension   . Myocardial infarction ([place])     " light"  . Nonhealing surgical wound    nonviable tissue  . PONV (postoperative nausea and vomiting)   . S/P unilateral BKA (below knee amputation), left ([place])   . Type II diabetes mellitus ([place])   . Wears glasses     Past Surgical History:  Procedure Laterality Date  . ABDOMINAL AORTOGRAM N/A 11/02/2016   Procedure: Abdominal Aortogram;  Surgeon: Waynetta Sandy, MD;  Location: [place] CV LAB;  Service: Cardiovascular;  Laterality: N/A;  . ABDOMINAL AORTOGRAM W/LOWER EXTREMITY Right 08/07/2018   Procedure: ABDOMINAL AORTOGRAM W/LOWER EXTREMITY;  Surgeon: [name], MD;  Location: [place] CV LAB;  Service: Cardiovascular;  Laterality: Right;  . AMPUTATION Left 09/27/2013   Procedure: LEFT GREAT TOE AMPUTATION;  Surgeon: [name], MD;  Location: [place];  Service: Orthopedics;  Laterality: Left;  . AMPUTATION Right 08/15/2015   Procedure: Right Great Toe Amputation;  Surgeon: [name], MD;  Location: [place];  Service: Orthopedics;  Laterality: Right;  . AMPUTATION Left 11/05/2016   Procedure: TRANSMETATARSAL AMPUTATION LEFT FOOT;  Surgeon: [name], MD;  Location: [place];  Service: Orthopedics;  Laterality: Left;  . AMPUTATION Left 03/11/2017   Procedure: LEFT BELOW KNEE AMPUTATION;  Surgeon: [name], MD;  Location: [place];  Service: Orthopedics;  Laterality: Left;  . AMPUTATION Right 03/11/2017   Procedure: RIGHT 2ND TOE AMPUTATION;  Surgeon: [name], MD;  Location: [place];  Service: Orthopedics;  Laterality: Right;  . AV FISTULA PLACEMENT  left arm  . CORONARY ARTERY BYPASS GRAFT N/A 11/04/2017   Procedure: CORONARY ARTERY BYPASS GRAFTING (CABG) x three, using left internal mammary artery  and right    leg greater saphenous vein;  Surgeon: Melrose Nakayama, MD;  Location: [place];  Service: Open Heart Surgery;  Laterality: N/A;  . FASCIOTOMY Right 08/07/2018   Procedure: FOUR COMPARTMENT FASCIOTOMY OF RIGHT LOWER LEG;  Surgeon: Rosetta Posner, MD;  Location: [place];  Service: Vascular;  Laterality: Right;  . FASCIOTOMY CLOSURE Right 08/10/2018   Procedure: FASCIOTOMY CLOSURE RIGHT LOWER EXTREMITY;  Surgeon: [name], MD;  Location: [place];  Service: Vascular;  Laterality: Right;  . HEMATOMA EVACUATION Right 08/07/2018   Procedure: EVACUATION HEMATOMA;  Surgeon: Rosetta Posner, MD;  Location: [place];  Service: Vascular;  Laterality: Right;  . LEFT HEART CATH AND CORONARY ANGIOGRAPHY N/A 10/31/2017   Procedure: LEFT HEART CATH AND CORONARY ANGIOGRAPHY;  Surgeon: Troy Sine, MD;  Location: [facility]  INVASIVE CV LAB;  Service: Cardiovascular;  Laterality: N/A;  . LEFT HEART CATHETERIZATION WITH CORONARY ANGIOGRAM N/A 09/13/2014   Procedure: LEFT HEART CATHETERIZATION WITH CORONARY ANGIOGRAM;  Surgeon: Sinclair Grooms, MD;  Location: [facility] CATH LAB;  Service: Cardiovascular;  Laterality: N/A;  . LOWER EXTREMITY ANGIOGRAPHY Bilateral 11/02/2016   Procedure: Lower Extremity Angiography;  Surgeon: Waynetta Sandy, MD;  Location: [place] CV LAB;  Service: Cardiovascular;  Laterality: Bilateral;  . PERIPHERAL VASCULAR ATHERECTOMY Left 11/02/2016   Procedure: Peripheral Vascular Atherectomy;  Surgeon: Waynetta Sandy, MD;  Location: [place] CV LAB;  Service: Cardiovascular;  Laterality: Left;  PERONEAL  . PERIPHERAL VASCULAR ATHERECTOMY Right 08/07/2018   Procedure: PERIPHERAL VASCULAR ATHERECTOMY;  Surgeon: [name], MD;  Location: [place] CV LAB;  Service: Cardiovascular;  Laterality: Right;  Peroneal artery  . PERIPHERAL VASCULAR BALLOON ANGIOPLASTY Right 08/07/2018   Procedure: PERIPHERAL VASCULAR BALLOON ANGIOPLASTY;  Surgeon: [name], MD;  Location: [place] CV LAB;  Service: Cardiovascular;  Laterality: Right;  anterior tibial artery  . STUMP REVISION Left 01/11/2017   Procedure: Revision Left Transmetatarsal Amputation;  Surgeon: [name], MD;  Location: [place];  Service: Orthopedics;  Laterality: Left;  . TEE WITHOUT CARDIOVERSION N/A 11/04/2017   Procedure: TRANSESOPHAGEAL ECHOCARDIOGRAM (TEE);  Surgeon: Melrose Nakayama, MD;  Location: [place];  Service: Open Heart Surgery;  Laterality: N/A;  . TRANSMETATARSAL AMPUTATION Right 08/10/2018   Procedure: RIGHT FIFTH TOE AMPUTATION;  Surgeon: [name], MD;  Location: [place];  Service: Vascular;  Laterality: Right;  . TRANSMETATARSAL AMPUTATION Right 09/14/2018   Procedure: TRANSMETATARSAL AMPUTATION;  Surgeon: [name], MD;  Location: [facility] OR;  Service: Vascular;  Laterality: Right;    Social History Social History   Tobacco Use  . Smoking status: Current Every Day Smoker    Packs/day: 0.50    Years: 26.00    Pack years: 13.00    Types: Cigarettes    Last attempt to quit: 08/17/2018    Years since quitting: 0.2  . Smokeless tobacco: Never Used  Substance Use Topics  . Alcohol use: Not Currently    Comment:  "haven't took a drink in over 5 years"  . Drug use: Yes    Types: Marijuana    Comment: occasional    Family History Family History  Problem Relation Age of Onset  . Heart failure Mother   . Hypertension Mother     Allergies  Allergen Reactions  . Coconut Oil Anaphylaxis and Other (See Comments)    Can use topically, allergic to coconut foods      REVIEW OF SYSTEMS  General: [ ]  Weight loss, [ ]  Fever, [ ]  chills Neurologic: [ ]  Dizziness, [ ]  Blackouts, [ ]  Seizure [ ]  Stroke, [ ]  "Mini stroke", [ ]  Slurred speech, [ ]  Temporary blindness; [ ]  weakness in arms or legs, [ ]  Hoarseness [ ]  Dysphagia Cardiac: [x ] Chest pain/pressure, [ ]  Shortness of breath at rest [ ]  Shortness of breath with exertion, [ ]  Atrial  fibrillation or irregular heartbeat  Vascular: [ ]  Pain in legs with walking, [ ]  Pain in legs at rest, [ ]  Pain in legs at night,  [ ]  Non-healing ulcer, [ ]  Blood clot in vein/DVT,   Pulmonary: [ ]  Home oxygen, [ ]  Productive cough, [ ]  Coughing up blood, [ ]  Asthma,  [ ]  Wheezing [ ]  COPD Musculoskeletal:  [ ]  Arthritis, [ ]  Low  back pain, [ ]  Joint pain Hematologic: [ ]  Easy Bruising, [ ]  Anemia; [ ]  Hepatitis Gastrointestinal: [ ]  Blood in stool, [ ]  Gastroesophageal Reflux/heartburn, Urinary: [ ]  chronic Kidney disease, [ ]  on HD - [ ]  MWF or [ ]  TTHS, [ ]  Burning with urination, [ ]  Difficulty urinating Skin: [ ]  Rashes, [ ]  Wounds Psychological: [ ]  Anxiety, [ ]  Depression  Physical Examination Vitals:   11/03/18 1007 11/03/18 1030 11/03/18 1045 11/03/18 1115  BP:  (!) 187/66 (!) 174/74 (!) 172/98  Pulse:  (!) 110 (!) 105 (!) 109  Resp:  18  13  Temp: 99 F (37.2 C)     TempSrc: Oral     SpO2:  98% 98% 98%  Weight:      Height:       Body mass index is 22.6 kg/m.  General:  WDWN in NAD Gait: Normal HENT: WNL, normocephalic Eyes: Pupils equal Pulmonary: normal non-labored breathing , without Rales, rhonchi,  wheezing Cardiac: RRR, without  Murmurs, rubs or gallops;  Skin: right TMA incision healed with small open are laterally s/p removal of retained piece of guaze.  Tenderness to palpation laterally.  No active drainage.     Musculoskeletal: no muscle wasting or atrophy; no edema  Neurologic: A&O X 3; Appropriate Affect ;  SENSATION: normal; MOTOR FUNCTION: grossly intact Speech is fluent/normal   Significant Diagnostic Studies: CBC Lab Results  Component Value Date   WBC 18.4 (H) 11/03/2018   HGB 12.1 (L) 11/03/2018   HCT 35.0 (L) 11/03/2018   MCV 77.1 (L) 11/03/2018   PLT 426 (H) 11/03/2018    BMET    Component Value Date/Time   NA 124 (L) 11/03/2018 1058   NA 133 (L) 02/07/2018 0937   K 3.8 11/03/2018 1058   CL 89 (L) 11/03/2018 1058   CO2  16 (L) 11/03/2018 1058   GLUCOSE 362 (H) 11/03/2018 1058   GLUCOSE 262 03/06/2008   BUN 81 (H) 11/03/2018 1058   BUN 37 (H) 02/07/2018 0937   CREATININE 18.45 (H) 11/03/2018 1058   CALCIUM 7.7 (L) 11/03/2018 1058   GFRNONAA 3 (L) 11/03/2018 1058   GFRAA 3 (L) 11/03/2018 1058   Estimated Creatinine Clearance: 5.9 mL/min (A) (by C-G formula based on SCr of 18.45 mg/dL (H)).  COAG Lab Results  Component Value Date   INR 1.25 11/04/2017   INR 1.00 10/30/2017   INR 1.05 09/13/2014     Foot x ray IMPRESSION: 1. Prior transmetatarsal amputation with stump soft tissue swelling and new subcutaneous emphysema overlying the residual second through fourth metatarsals, concerning for infection. No radiographic evidence of osteomyelitis.  ASSESSMENT/PLAN:  S/P Left TMA with open lateral incision 90% of the incision is healed, no purulent drainage  Continue IV antibiotics and Plavix and Heparin.    Patient had retained guaze lateral incision that was removed 10/28/2018 in the ED.  We will observe the wound for now no emergent need for surgical intervention.  He does not have nay revascularization options.    Roxy Horseman 11/03/2018 11:55 AM  I agree with the above.  I have seen and examined the patient.  He recently had a piece of gauze extracted from his wound on the lateral side where it remains open with some drainage.  There is no osteo on XRAY by there is gas, which could potentially be from the retained gauze.  The patient's next operation is likely a BKA, so I would try to treat this  with IV abx for now to see if his WBC improves.  We will continue to follow with you.  Dr. Carlis Abbott will see him on Monday.  WElls Shantrice Rodenberg

## 2018-11-03 NOTE — Progress Notes (Signed)
ANTICOAGULATION CONSULT NOTE - Initial Consult  Pharmacy Consult for Heparin Indication: chest pain/ACS  Allergies  Allergen Reactions  . Coconut Oil Anaphylaxis and Other (See Comments)    Can use topically, allergic to coconut foods     Patient Measurements: Height: 6\' 2"  (188 cm) Weight: 176 lb (79.8 kg) IBW/kg (Calculated) : 82.2  Vital Signs: Temp: 101.6 F (38.7 C) (03/20 0832) Temp Source: Oral (03/20 0832) BP: 184/63 (03/20 0845) Pulse Rate: 111 (03/20 0845)  Labs: Recent Labs    11/03/18 0848  HGB 12.1*  HCT 35.0*  PLT 426*    Estimated Creatinine Clearance: 7.1 mL/min (A) (by C-G formula based on SCr of 15.35 mg/dL (H)).   Medical History: Past Medical History:  Diagnosis Date  . Anemia   . Atherosclerosis of lower extremity ([place])   . Blood clot in vein    right calf  . CAD (coronary artery disease)   . Cataracts, bilateral   . Chronic combined systolic and diastolic heart failure ([place])   . Complication of anesthesia   . Depression   . ESRD (end stage renal disease) on dialysis [facility])    "MWF [employer]" (03/08/2017)  . GERD (gastroesophageal reflux disease)   . Heart murmur   . History of blood transfusion    "related to OR"  . Hypertension   . Myocardial infarction ([place])     " light"  . Nonhealing surgical wound    nonviable tissue  . PONV (postoperative nausea and vomiting)   . S/P unilateral BKA (below knee amputation), left ([place])   . Type II diabetes mellitus ([place])   . Wears glasses     Assessment: Patient presents to the emergency room for evaluation of fever.  Patient did have some discomfort in his chest when EMS arrived.  He was given nitroglycerin and aspirin with some improvement. Pharmacy consulted for heparin drip dosing.  Goal of Therapy:  Heparin level 0.3-0.7 units/ml Monitor platelets by anticoagulation protocol: Yes   Plan:  Give 4000 units bolus x 1 Start heparin infusion at 950 units/hr Check anti-Xa level in 8  hours and daily while on heparin Continue to monitor H&H and platelets  Alanda Slim, PharmD, [facility] Clinical Pharmacist Please see AMION for all Pharmacists' Contact Phone Numbers 11/03/2018, 9:42 AM

## 2018-11-03 NOTE — Progress Notes (Signed)
PHARMACY - PHYSICIAN COMMUNICATION CRITICAL VALUE ALERT - BLOOD CULTURE IDENTIFICATION (BCID)  [name] Groeneveld Sr. is an 43 y.o. male who presented to [facility] on 11/03/2018 with a chief complaint of fever s/p left BKA and right partial foot amputation  Assessment:  Pt with MRSA growing in blood cultures (likely source foot infection)  Name of physician (or Provider) Contacted: Dr. Alfonse Spruce  Current antibiotics: Vancomycin and Cefepime  Changes to prescribed antibiotics recommended:  D/c Cefepime, continue vancomycin  Results for orders placed or performed during the hospital encounter of 11/03/18  Blood Culture ID Panel (Reflexed) (Collected: 11/03/2018  8:54 AM)  Result Value Ref Range   Enterococcus species NOT DETECTED NOT DETECTED   Listeria monocytogenes NOT DETECTED NOT DETECTED   Staphylococcus species DETECTED (A) NOT DETECTED   Staphylococcus aureus (BCID) DETECTED (A) NOT DETECTED   Methicillin resistance DETECTED (A) NOT DETECTED   Streptococcus species NOT DETECTED NOT DETECTED   Streptococcus agalactiae NOT DETECTED NOT DETECTED   Streptococcus pneumoniae NOT DETECTED NOT DETECTED   Streptococcus pyogenes NOT DETECTED NOT DETECTED   Acinetobacter baumannii NOT DETECTED NOT DETECTED   Enterobacteriaceae species NOT DETECTED NOT DETECTED   Enterobacter cloacae complex NOT DETECTED NOT DETECTED   Escherichia coli NOT DETECTED NOT DETECTED   Klebsiella oxytoca NOT DETECTED NOT DETECTED   Klebsiella pneumoniae NOT DETECTED NOT DETECTED   Proteus species NOT DETECTED NOT DETECTED   Serratia marcescens NOT DETECTED NOT DETECTED   Haemophilus influenzae NOT DETECTED NOT DETECTED   Neisseria meningitidis NOT DETECTED NOT DETECTED   Pseudomonas aeruginosa NOT DETECTED NOT DETECTED   Candida albicans NOT DETECTED NOT DETECTED   Candida glabrata NOT DETECTED NOT DETECTED   Candida krusei NOT DETECTED NOT DETECTED   Candida parapsilosis NOT DETECTED NOT DETECTED   Candida tropicalis NOT DETECTED NOT DETECTED    Sherlon Handing, PharmD, BCPS Clinical pharmacist  **Pharmacist phone directory can now be found on [email or web address] ([email or web address]).  Listed under [place]. 11/03/2018  11:56 PM

## 2018-11-03 NOTE — ED Notes (Signed)
Lab results was given to Dr.Knapp.

## 2018-11-03 NOTE — ED Notes (Signed)
Pt states that he does not make urine.  

## 2018-11-04 ENCOUNTER — Other Ambulatory Visit (HOSPITAL_COMMUNITY): Payer: Medicaid Other

## 2018-11-04 DIAGNOSIS — R509 Fever, unspecified: Secondary | ICD-10-CM

## 2018-11-04 DIAGNOSIS — Z79899 Other long term (current) drug therapy: Secondary | ICD-10-CM

## 2018-11-04 DIAGNOSIS — T8743 Infection of amputation stump, right lower extremity: Secondary | ICD-10-CM | POA: Diagnosis present

## 2018-11-04 DIAGNOSIS — E785 Hyperlipidemia, unspecified: Secondary | ICD-10-CM

## 2018-11-04 DIAGNOSIS — I214 Non-ST elevation (NSTEMI) myocardial infarction: Secondary | ICD-10-CM

## 2018-11-04 DIAGNOSIS — R7881 Bacteremia: Secondary | ICD-10-CM | POA: Diagnosis present

## 2018-11-04 DIAGNOSIS — I132 Hypertensive heart and chronic kidney disease with heart failure and with stage 5 chronic kidney disease, or end stage renal disease: Secondary | ICD-10-CM

## 2018-11-04 LAB — HEPARIN LEVEL (UNFRACTIONATED)
Heparin Unfractionated: <0.1 IU/mL — ABNORMAL LOW (ref 0.30–0.70)
Heparin Unfractionated: 0.21 IU/mL — ABNORMAL LOW (ref 0.30–0.70)

## 2018-11-04 LAB — GLUCOSE, CAPILLARY
Glucose-Capillary: 357 mg/dL — ABNORMAL HIGH (ref 70–99)
Glucose-Capillary: 198 mg/dL — ABNORMAL HIGH (ref 70–99)
Glucose-Capillary: 233 mg/dL — ABNORMAL HIGH (ref 70–99)

## 2018-11-04 LAB — TROPONIN I: Troponin I: 1.32 ng/mL (ref <0.03)

## 2018-11-04 LAB — CBC
HCT: 34.5 % — ABNORMAL LOW (ref 39.0–52.0)
Hemoglobin: 11.2 g/dL — ABNORMAL LOW (ref 13.0–17.0)
MCH: 25.5 pg — ABNORMAL LOW (ref 26.0–34.0)
MCHC: 32.5 g/dL (ref 30.0–36.0)
MCV: 78.6 fL — ABNORMAL LOW (ref 80.0–100.0)
Platelets: 390 10*3/uL (ref 150–400)
RBC: 4.39 MIL/uL (ref 4.22–5.81)
RDW: 17.2 % — ABNORMAL HIGH (ref 11.5–15.5)
WBC: 21 10*3/uL — ABNORMAL HIGH (ref 4.0–10.5)
nRBC: 0 % (ref 0.0–0.2)

## 2018-11-04 LAB — HIV ANTIBODY (ROUTINE TESTING W REFLEX): HIV Screen 4th Generation wRfx: NONREACTIVE

## 2018-11-04 MED ORDER — MORPHINE SULFATE 15 MG PO TABS
15.0000 mg | ORAL_TABLET | ORAL | Status: DC | PRN
  Administered 2018-11-04 – 2018-11-07 (×14): 15 mg via ORAL
  Filled 2018-11-04 (×16): qty 1

## 2018-11-04 MED ORDER — HEPARIN SODIUM (PORCINE) 5000 UNIT/ML IJ SOLN
5000.0000 [IU] | Freq: Three times a day (TID) | INTRAMUSCULAR | Status: DC
  Administered 2018-11-04 – 2018-11-07 (×8): 5000 [IU] via SUBCUTANEOUS
  Filled 2018-11-04 (×8): qty 1

## 2018-11-04 MED ORDER — HEPARIN BOLUS VIA INFUSION
2000.0000 [IU] | Freq: Once | INTRAVENOUS | Status: AC
  Administered 2018-11-04: 2000 [IU] via INTRAVENOUS
  Filled 2018-11-04: qty 2000

## 2018-11-04 MED ORDER — PRO-STAT SUGAR FREE PO LIQD
30.0000 mL | Freq: Two times a day (BID) | ORAL | Status: DC
  Administered 2018-11-04 – 2018-11-07 (×7): 30 mL via ORAL
  Filled 2018-11-04 (×7): qty 30

## 2018-11-04 MED ORDER — OXYCODONE HCL 5 MG PO TABS: 10.0000 mg | ORAL_TABLET | Freq: Four times a day (QID) | ORAL | Status: DC | PRN

## 2018-11-04 MED ORDER — LANTHANUM CARBONATE 500 MG PO CHEW
1000.0000 mg | CHEWABLE_TABLET | Freq: Three times a day (TID) | ORAL | Status: DC
  Administered 2018-11-04 – 2018-11-07 (×8): 1000 mg via ORAL
  Filled 2018-11-04 (×9): qty 2

## 2018-11-04 NOTE — Progress Notes (Addendum)
[facility] Progress Note   Subjective: Awake, denies chest pain, feels better. Tmax 102 overnight. BS 447.   Objective Vitals:   11/03/18 1830 11/03/18 1907 11/03/18 2038 11/04/18 0551  BP: (!) 168/84 (!) 157/78 (!) 166/82 (!) 143/81  Pulse: (!) 117 (!) 112 (!) 115 (!) 107  Resp: 20 20 17  (!) 23  Temp:  (!) 102 F (38.9 C) (!) 100.7 F (38.2 C) 100.2 F (37.9 C)  TempSrc:  Oral Oral   SpO2: 100% 100% 100% 100%  Weight:   78.3 kg 78.3 kg  Height:   6\' 2"  (1.88 m)    Physical Exam General: Chronically ill appearing male in NAD Heart: S1,S2 Tachy-ST on monitor rate 110-118 Lungs: few bibasilar crackles, slightly decreased LLL. No WOB Abdomen: Soft, NT Extremities: L BKA R TMA-decreased swelling and erythema from yesterday, no drainage.  Dialysis Access: L AVF + bruit   Additional Objective Labs: Basic Metabolic Panel: Recent Labs  Lab 10/28/18 2203 11/03/18 1058 11/04/18 0252  NA 130* 124* 128*  K 4.2 3.8 3.7  CL 92* 89* 89*  CO2 15* 16* 22  GLUCOSE 148* 362* 447*  BUN 79* 81* 39*  CREATININE 15.35* 18.45* 10.85*  CALCIUM 8.1* 7.7* 8.7*  PHOS  --  4.0  --    Liver Function Tests: Recent Labs  Lab 11/03/18 1058  AST 22  ALT 11  ALKPHOS 72  BILITOT 0.6  PROT 7.7  ALBUMIN 2.7*   No results for input(s): LIPASE, AMYLASE in the last 168 hours. CBC: Recent Labs  Lab 10/28/18 2203 11/03/18 0848 11/04/18 0252  WBC 8.8 18.4* 21.0*  NEUTROABS 6.0 15.8*  --   HGB 14.3 12.1* 11.2*  HCT 45.0 35.0* 34.5*  MCV 80.9 77.1* 78.6*  PLT 376 426* 390   Blood Culture    Component Value Date/Time   SDES BLOOD RIGHT FOREARM 11/04/2018 0250   SPECREQUEST  11/04/2018 0250    BOTTLES DRAWN AEROBIC AND ANAEROBIC Blood Culture adequate volume   CULT  11/04/2018 0250    NO GROWTH < 12 HOURS Performed at [facility], [place] [address], [place], [place] [address]    REPTSTATUS PENDING 11/04/2018 0250    Cardiac Enzymes: Recent Labs  Lab  11/03/18 1412 11/03/18 2145 11/04/18 0252  TROPONINI 1.46* 1.66* 1.32*   CBG: Recent Labs  Lab 10/28/18 2122 11/03/18 2145 11/04/18 0757  GLUCAP 168* 291* 357*   Iron Studies: No results for input(s): IRON, TIBC, TRANSFERRIN, FERRITIN in the last 72 hours. @lablastinr3 @ Studies/Results: Dg Chest 2 View  Result Date: 11/03/2018 CLINICAL DATA:  Shortness of breath.  Chest pain. EXAM: CHEST - 2 VIEW COMPARISON:  09/07/2018. FINDINGS: Prior median sternotomy and CABG. Cardiomegaly with normal pulmonary vascularity. Mild right perihilar infiltrate can not be excluded. No pleural effusion or pneumothorax IMPRESSION: 1.  Prior CABG. 2.  Mild right perihilar infiltrate can not be excluded. Electronically Signed   By: Marcello Moores  Register   On: 11/03/2018 10:42   Dg Foot Complete Right  Result Date: 11/03/2018 CLINICAL DATA:  Foot pain.  Fever. EXAM: RIGHT FOOT COMPLETE - 3+ VIEW COMPARISON:  Right foot x-rays dated October 04, 2018. FINDINGS: Postsurgical changes related to transmetatarsal amputation again noted. Surgical margins are maintained. No osseous destruction or periosteal reaction. No acute fracture or dislocation. Joint spaces are preserved. Osteopenia. Soft tissue swelling of the stump with small amount of subcutaneous emphysema overlying the residual second through fourth metatarsals. Vascular calcifications. IMPRESSION: 1. Prior transmetatarsal amputation  with stump soft tissue swelling and new subcutaneous emphysema overlying the residual second through fourth metatarsals, concerning for infection. No radiographic evidence of osteomyelitis. Electronically Signed   By: Titus Dubin M.D.   On: 11/03/2018 10:46   Medications: . heparin 1,200 Units/hr (11/04/18 0152)   . aspirin EC  81 mg Oral Daily  . atorvastatin  80 mg Oral q1800  . [START ON 11/06/2018] calcitRIOL  1.25 mcg Oral Q M,W,F-HD  . [START ON 11/06/2018] calcitRIOL  1.25 mcg Oral Q M,W,F-HD  . carvedilol  6.25 mg Oral  BID  . Chlorhexidine Gluconate Cloth  6 each Topical Q0600  . clopidogrel  75 mg Oral Daily  . insulin aspart  0-9 Units Subcutaneous TID WC  . insulin glargine  8 Units Subcutaneous Daily  . isosorbide mononitrate  30 mg Oral Daily  . multivitamin  1 tablet Oral QHS  . vancomycin variable dose per unstable renal function (pharmacist dosing)   Does not apply See admin instructions     [facility] MWF 4 hrs 200NRe 500/800  77.5 kg 2.0 K/ 3.5 Ca UFP 2 L AVF Hep 2500 units IV TIW -Calcitriol 1.25 mcg PO TIW -Venofer 100 mg IV weekly (last dose 10/25/18 Last Tsat 24 10/25/18)   Assessment/Plan: 1.  Chest Pain-Troponin 1.97, T wave inversion anterior leads. Cardiology consulted. On heparin gtt. Chest pain free at present.  2. Fever/ Pain+drainage R TMA-VVS consulted. Elevated temp/WBC 21. BC NG 12 hrs-started on Vanc/Maxipime per primary. Xray R TMA with soft tissue swelling and new SQ emphysema overlying residual 2nd and 4th metarsals concerning for infection. Tmax 102 overnight-still with low grade fever.  3. Hyperglycemia-BS 447 AG 17 Co2 22. Per primary.  4.  ESRD -  MWF. Next HD 11/06/18.  Hold usual heparin while on heparin gtt  5.  Hypertension/volume  - Very hypertensive on admission. Home meds resumed. Better controlled today. HD 03/20 Net UF 2.5 Post wt 78.3 kg Still above OP EDW. Continue lowering volume as tolerated.  6.  Anemia  - HGB 11.2. No ESA needed. Hold venofer in setting of possible sepsis.  7.  Metabolic bone disease -  Ca 7.7 C Ca 8.7. Phos at goal. Uses 3.5 Ca bath. Continue binders, VDRA.  8.  Nutrition -Albumin 2.7.Renal/Carb mod diet, prostat, renal vits. 9.  PAD-S/P L BKA, R TMA 10. Combined systolic and diastolic HF-no evidence of volume overload by exam.  11. CAD H/O CABG X 3  Rita H. Brown NP-C 11/04/2018, 9:11 AM  [facility] [phone]  Pt seen, examined and agree w A/P as above. BP's better today on home meds.  High fevers overnight, no cough  or SOB and CXR clear. +Drainage/ pain from the R TMA per patient, may be better today. [facility] 11/04/2018, 12:45 PM

## 2018-11-04 NOTE — Progress Notes (Signed)
ANTICOAGULATION CONSULT NOTE - Follow-up Consult  Pharmacy Consult for Heparin Indication: chest pain/ACS  Allergies  Allergen Reactions  . Coconut Oil Anaphylaxis and Other (See Comments)    Can use topically, allergic to coconut foods     Patient Measurements: Height: 6\' 2"  (188 cm) Weight: 172 lb 9.9 oz (78.3 kg) IBW/kg (Calculated) : 82.2  Vital Signs: Temp: 100.7 F (38.2 C) (03/20 2038) Temp Source: Oral (03/20 2038) BP: 166/82 (03/20 2038) Pulse Rate: 115 (03/20 2038)  Labs: Recent Labs    11/03/18 0848 11/03/18 1058 11/03/18 1412 11/03/18 2145 11/04/18 0012  HGB 12.1*  --   --   --   --   HCT 35.0*  --   --   --   --   PLT 426*  --   --   --   --   HEPARINUNFRC  --   --   --   --  <0.10*  CREATININE  --  18.45*  --   --   --   TROPONINI  --   --  1.46* 1.66*  --     Estimated Creatinine Clearance: 5.8 mL/min (A) (by C-G formula based on SCr of 18.45 mg/dL (H)).  Assessment: 43 y.o. on heparin for r/o ACS.   Heparin level undetectable on gtt at 950 units/hr. No issues with line or bleeding reported per RN.  Goal of Therapy:  Heparin level 0.3-0.7 units/ml Monitor platelets by anticoagulation protocol: Yes   Plan:  Rebolus heparin 2000 units Increase heparin gtt to 1200 units/hr Will f/u 8 hr heparin level  Sherlon Handing, PharmD, BCPS Clinical pharmacist  **Pharmacist phone directory can now be found on [email or web address] ([email or web address]).  Listed under [place]. 11/04/2018, 1:37 AM

## 2018-11-04 NOTE — Progress Notes (Signed)
Progress Note  Patient Name: [name] Okey Sr. Date of Encounter: 11/04/2018  Primary Cardiologist: Jenkins Rouge, MD   Subjective   No chest pain. Remains febrile. ECG changes are better. Troponin elevation is "flat".  Inpatient Medications    Scheduled Meds: . aspirin EC  81 mg Oral Daily  . atorvastatin  80 mg Oral q1800  . [START ON 11/06/2018] calcitRIOL  1.25 mcg Oral Q M,W,F-HD  . carvedilol  6.25 mg Oral BID  . Chlorhexidine Gluconate Cloth  6 each Topical Q0600  . clopidogrel  75 mg Oral Daily  . feeding supplement (PRO-STAT SUGAR FREE 64)  30 mL Oral BID  . insulin aspart  0-9 Units Subcutaneous TID WC  . insulin glargine  8 Units Subcutaneous Daily  . isosorbide mononitrate  30 mg Oral Daily  . lanthanum  1,000 mg Oral TID WC  . multivitamin  1 tablet Oral QHS  . vancomycin variable dose per unstable renal function (pharmacist dosing)   Does not apply See admin instructions   Continuous Infusions: . heparin 1,400 Units/hr (11/04/18 1220)   PRN Meds: acetaminophen **OR** acetaminophen, morphine   Vital Signs    Vitals:   11/03/18 1830 11/03/18 1907 11/03/18 2038 11/04/18 0551  BP: (!) 168/84 (!) 157/78 (!) 166/82 (!) 143/81  Pulse: (!) 117 (!) 112 (!) 115 (!) 107  Resp: _0 (!) 23  Temp:  (!) 102 F (38.9 C) (!) 100.7 F (38.2 C) 100.2 F (37.9 C)  TempSrc:  Oral Oral   SpO2: 100% 100% 100% 100%  Weight:   78.3 kg 78.3 kg  Height:   _1  (1.88 m)     Intake/Output Summary (Last 24 hours) at 11/04/2018 1355 Last data filed at 11/04/2018 0630 Gross per 24 hour  Intake 628 ml  Output 2500 ml  Net -1872 ml   Last 3 Weights 11/04/2018 11/03/2018 11/03/2018  Weight (lbs) 172 lb 9.9 oz 172 lb 9.9 oz 176 lb  Weight (kg) 78.3 kg 78.3 kg 79.833 kg  Some encounter information is confidential and restricted. Go to Review Flowsheets activity to see all data.      Telemetry    Sinus tachycardia - Personally Reviewed  ECG    NSR with  improved anterior T wave inversion since yesterday - Personally Reviewed  Physical Exam  Appears chronically ill GEN: No acute distress.   Neck: No JVD Cardiac: RRR, no murmurs, rubs, or gallops. LUE AV fistula with good thrill/bruit Respiratory: Clear to auscultation bilaterally. GI: Soft, nontender, non-distended  MS: L BKA, R transmetatarsal amputation, swollen, may less erythematous Neuro:  Nonfocal  Psych: Normal affect   Labs    Chemistry Recent Labs  Lab 10/28/18 2203 11/03/18 1058 11/04/18 0252  NA 130* 124* 128*  K 4.2 3.8 3.7  CL 92* 89* 89*  CO2 15* 16* 22  GLUCOSE 148* 362* 447*  BUN 79* 81* 39*  CREATININE 15.35* 18.45* 10.85*  CALCIUM 8.1* 7.7* 8.7*  PROT  --  7.7  --   ALBUMIN  --  2.7*  --   AST  --  22  --   ALT  --  11  --   ALKPHOS  --  72  --   BILITOT  --  0.6  --   GFRNONAA 3* 3* 5*  GFRAA 4* 3* 6*  ANIONGAP 23* 19* 17*     Hematology Recent Labs  Lab 10/28/18 2203 11/03/18 0848 11/04/18 0252  WBC 8.8 18.4* 21.0*  RBC 5.56 4.54 4.39  HGB 14.3 12.1* 11.2*  HCT 45.0 35.0* 34.5*  MCV 80.9 77.1* 78.6*  MCH 25.7* 26.7 25.5*  MCHC 31.8 34.6 32.5  RDW 17.5* 17.8* 17.2*  PLT 376 426* 390    Cardiac Enzymes Recent Labs  Lab 11/03/18 1412 11/03/18 2145 11/04/18 0252  TROPONINI 1.46* 1.66* 1.32*    Recent Labs  Lab 11/03/18 0912  TROPIPOC 1.97*     BNPNo results for input(s): BNP, PROBNP in the last 168 hours.   DDimer No results for input(s): DDIMER in the last 168 hours.   Radiology    Dg Chest 2 View  Result Date: 11/03/2018 CLINICAL DATA:  Shortness of breath.  Chest pain. EXAM: CHEST - 2 VIEW COMPARISON:  09/07/2018. FINDINGS: Prior median sternotomy and CABG. Cardiomegaly with normal pulmonary vascularity. Mild right perihilar infiltrate can not be excluded. No pleural effusion or pneumothorax IMPRESSION: 1.  Prior CABG. 2.  Mild right perihilar infiltrate can not be excluded. Electronically Signed   By: Marcello Moores  Register    On: 11/03/2018 10:42   Dg Foot Complete Right  Result Date: 11/03/2018 CLINICAL DATA:  Foot pain.  Fever. EXAM: RIGHT FOOT COMPLETE - 3+ VIEW COMPARISON:  Right foot x-rays dated October 04, 2018. FINDINGS: Postsurgical changes related to transmetatarsal amputation again noted. Surgical margins are maintained. No osseous destruction or periosteal reaction. No acute fracture or dislocation. Joint spaces are preserved. Osteopenia. Soft tissue swelling of the stump with small amount of subcutaneous emphysema overlying the residual second through fourth metatarsals. Vascular calcifications. IMPRESSION: 1. Prior transmetatarsal amputation with stump soft tissue swelling and new subcutaneous emphysema overlying the residual second through fourth metatarsals, concerning for infection. No radiographic evidence of osteomyelitis. Electronically Signed   By: Titus Dubin M.D.   On: 11/03/2018 10:46    Cardiac Studies   Echo 09/08/2018 Study Conclusions  - Left ventricle: The cavity size was mildly dilated. Wall thickness was normal. Systolic function was normal. The estimated ejection fraction was in the range of 50% to 55%. Wall motion was normal; there were no regional wall motion abnormalities. Doppler parameters are consistent with abnormal left ventricular relaxation (grade 1 diastolic dysfunction). - Mitral valve: Severely calcified annulus. Mildly thickened leaflets . - Left atrium: The atrium was mildly dilated. - Atrial septum: There was an atrial septal aneurysm.  Impressions:  - Normal LV systolic function; mild LVH; mild diastolic dysfunction; severe MAC with possible small oscillating density on posterior annulus (consider blood cultures if clinically indicated); mild MR.  Patient Profile     43 y.o. male hx of ESRD on HD MWF, h/o RLE DVT, chronic combined systolic and diastolic heart failure, HTN, HLD, DM II, CAD s/p CABG x 3 in 10/2017, and severe PAD s/p L  BKA and R partial foot amputation, developed angina, ECG changes and mild troponin elevation in the setting of acute febrile illness  Assessment & Plan    Demand injury in a patient with known areas of ischemic myocardium, in the setting of acute infection and hemodynamic swings. No plan for additional cardic workup or change in meds. [facility] will sign off.   Medication Recommendations:  Continue ASA/clopidogrel, hi-dose atorvastatin and carvedilol - avoid abrupt beta blocker discontinuation. Other recommendations (labs, testing, etc):  n/a Follow up as an outpatient:  Please call us when he is approaching DC to arrange follow up  For questions or updates, please contact [place] Please consult [email or web address] for contact info under  Signed, Sanda Klein, MD  11/04/2018, 1:55 PM

## 2018-11-04 NOTE — Progress Notes (Signed)
ANTICOAGULATION CONSULT NOTE - Follow-up Consult  Pharmacy Consult for Heparin Indication: chest pain/ACS  Allergies  Allergen Reactions  . Coconut Oil Anaphylaxis and Other (See Comments)    Can use topically, allergic to coconut foods     Patient Measurements: Height: 6\' 2"  (188 cm) Weight: 172 lb 9.9 oz (78.3 kg) IBW/kg (Calculated) : 82.2  Vital Signs: Temp: 100.2 F (37.9 C) (03/21 0551) BP: 143/81 (03/21 0551) Pulse Rate: 107 (03/21 0551)  Labs: Recent Labs    11/03/18 0848 11/03/18 1058 11/03/18 1412 11/03/18 2145 11/04/18 0012 11/04/18 0252 11/04/18 1007  HGB 12.1*  --   --   --   --  11.2*  --   HCT 35.0*  --   --   --   --  34.5*  --   PLT 426*  --   --   --   --  390  --   HEPARINUNFRC  --   --   --   --  <0.10*  --  0.21*  CREATININE  --  18.45*  --   --   --  10.85*  --   TROPONINI  --   --  1.46* 1.66*  --  1.32*  --     Estimated Creatinine Clearance: 9.9 mL/min (A) (by C-G formula based on SCr of 10.85 mg/dL (H)).  Assessment: 43 y.o. on heparin for r/o ACS. Troponins likely elevated due to demand ischemia in setting of known coronary dx. Heparin remains subtherapeutic this morning, CBC down likely due to dilution. No issues with infusion per RN.  Goal of Therapy:  Heparin level 0.3-0.7 units/ml Monitor platelets by anticoagulation protocol: Yes   Plan:  -Increase heparin to 1400 units/hr -Recheck heparin level in 8 hr  Arrie Senate, PharmD, BCPS Clinical Pharmacist [phone] Please check AMION for all [facility] numbers 11/04/2018

## 2018-11-04 NOTE — Evaluation (Signed)
Physical Therapy Evaluation Patient Details Name: [name] Suleiman Sr. MRN: [id number] DOB: [birth year] Today's Date: 11/04/2018   History of Present Illness  Pt is a 43 y.o. 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 R foot imaging shows subcutaneous emphysema without any bony changes. Blood cultures with MRSA bactermeia. Plan for TTE. 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    Clinical Impression  Pt presents with an overall decrease in functional mobility secondary to above. PTA, pt mod indep with wheelchair; lives alone, but has PCA assist 5x/wk. Pt declining OOB mobility secondary to pain. Reinforced importance of mobility, pressure relief and BLE ROM/strengthening. Pt would benefit from continued acute PT services to maximize functional mobility and independence prior to d/c with HHPT services.     Follow Up Recommendations Home health PT    Equipment Recommendations  None recommended by PT    Recommendations for Other Services       Precautions / Restrictions Precautions Precautions: Fall Precaution Comments: H/o L BKA, R transmet amputation; does not have LLE prosthetic in room Restrictions Weight Bearing Restrictions: No      Mobility  Bed Mobility Overal bed mobility: Modified Independent             General bed mobility comments: Encouragement for BLE mobility, sitting EOB, etc. for pressure relief and to maintain strength  Transfers                 General transfer comment: Pt declined  Ambulation/Gait                Stairs            Wheelchair Mobility    Modified Rankin (Stroke Patients Only)       Balance                                             Pertinent Vitals/Pain Pain Assessment: Faces Faces Pain Scale: Hurts even more Pain Location: R foot Pain Descriptors / Indicators:  Constant;Sharp;Grimacing Pain Intervention(s): Limited activity within patient's tolerance    Home Living Family/patient expects to be discharged to:: Private residence Living Arrangements: Alone Available Help at Discharge: Personal care attendant;Available PRN/intermittently Type of Home: Apartment Home Access: Level entry     Home Layout: One level Home Equipment: Grab bars - toilet;Grab bars - tub/shower;Wheelchair - [employer] - 2 wheels;Crutches;Tub bench;Cane - single point Additional Comments: Wheelchair accessible apartment    Prior Function Level of Independence: Needs assistance   Gait / Transfers Assistance Needed: Primarily using w/c for mobility due to R foot pain and "they told me not to be on my foot so it can heal." Mod indep with w/c transfers. Transport to HD  ADL's / Homemaking Assistance Needed: PCA 5 day/wk for 5 hrs/day to assist with household tasks (cooking, cleaning). Pt report mod indep with ADLs        Hand Dominance        Extremity/Trunk Assessment   Upper Extremity Assessment Upper Extremity Assessment: Overall WFL for tasks assessed    Lower Extremity Assessment Lower Extremity Assessment: RLE deficits/detail;LLE deficits/detail RLE Deficits / Details: R ankle mobility limited due to pain/stiffness; hip/knee WFL RLE: Unable to fully assess due to pain LLE Deficits /  Details: L hip strength WFL; slight L knee flexion contracture       Communication   Communication: No difficulties  Cognition Arousal/Alertness: Awake/alert Behavior During Therapy: Flat affect Overall Cognitive Status: Within Functional Limits for tasks assessed                                        General Comments General comments (skin integrity, edema, etc.): HR 90-100s at rest    Exercises Other Exercises Other Exercises: Encouraged ROM in all BLE joints, pt reports familiar with BLE exercises from past PT experiences   Assessment/Plan     PT Assessment Patient needs continued PT services  PT Problem List Decreased range of motion;Decreased activity tolerance;Decreased balance;Decreased mobility       PT Treatment Interventions DME instruction;Functional mobility training;Therapeutic activities;Therapeutic exercise;Patient/family education;Balance training;Wheelchair mobility training    PT Goals (Current goals can be found in the Care Plan section)  Acute Rehab PT Goals Patient Stated Goal: I want the pain in my foot to finally stop PT Goal Formulation: With patient Time For Goal Achievement: 11/18/18 Potential to Achieve Goals: Fair    Frequency Min 3X/week   Barriers to discharge        Co-evaluation               AM-PAC PT "6 Clicks" Mobility  Outcome Measure Help needed turning from your back to your side while in a flat bed without using bedrails?: None Help needed moving from lying on your back to sitting on the side of a flat bed without using bedrails?: None Help needed moving to and from a bed to a chair (including a wheelchair)?: A Little Help needed standing up from a chair using your arms (e.g., wheelchair or bedside chair)?: A Little Help needed to walk in hospital room?: A Little Help needed climbing 3-5 steps with a railing? : A Little 6 Click Score: 20    End of Session   Activity Tolerance: Patient limited by pain Patient left: in bed;with call bell/phone within reach Nurse Communication: Mobility status PT Visit Diagnosis: Other abnormalities of gait and mobility (R26.89);Pain Pain - Right/Left: Right Pain - part of body: Ankle and joints of foot    Time: 1510-1524 PT Time Calculation (min) (ACUTE ONLY): 14 min   Charges:   PT Evaluation $PT Eval Moderate Complexity: [place], PT, DPT Acute Rehabilitation Services  Pager [phone] Office [phone]  Derry Lory 11/04/2018, 4:06 PM

## 2018-11-04 NOTE — Progress Notes (Addendum)
   Subjective: Mr. Darley reports he is continuing to have right foot pain. He states he is also having some abdominal pain but relates this to being hungry. Denies nausea or vomiting. All questions and concerns addressed.   Objective:  Vital signs in last 24 hours: Vitals:   11/03/18 1830 11/03/18 1907 11/03/18 2038 11/04/18 0551  BP: (!) 168/84 (!) 157/78 (!) 166/82 (!) 143/81  Pulse: (!) 117 (!) 112 (!) 115 (!) 107  Resp: 20 20 17  (!) 23  Temp:  (!) 102 F (38.9 C) (!) 100.7 F (38.2 C) 100.2 F (37.9 C)  TempSrc:  Oral Oral   SpO2: 100% 100% 100% 100%  Weight:   78.3 kg 78.3 kg  Height:   6\' 2"  (1.88 m)    Physical Exam Gen: uncomfortably laying in bed due to right foot pain CV: RRR, no murmurs  Ext: Left BKA, right transmetatarsal amputation incision intact with no drainage seen, no edema, pulse intact   Assessment/Plan:  Active Problems:   Chest pain  Mr. Gust Eugene is a 43 year old male with ESRD on HD, chronic combined systolic and diastolic heart failure, HTN, HLD, type II DM, CAD s/p CABG x3 in 2019 and severe PAD s/p left BKA and right partial foot amputation presenting with a fever, chest pain and right transmetatarsal amputation foot pain.   Chest Pain CAD s/p CABG x3: Troponin 1.46 -> 1.66 -> 1.32. Repeat EKG shows sinus tachycardia and T wave inversions in the anterior leads. No more episodes of chest pain. - Cardiology on board, appreciate recommendations - Cards is considering myoview vs cardiac cath  - Continue IV heparin  - Telemetry   MRSA Bacteremia  Severe PAD s/p left BKA and right partial foot amputation: Remained febrile overnight, tmax 102. Afebrile this morning. Blood culture showed MRSA. Foot xray consistent with infection, soft tissue swelling and new subcutaneous emphysema. Cefepime stopped overnight.  - VVS was consulted, appreciate recommendations - Continue vancomycin  - Echocardiogram - Pain control morphine 15 mg q4h prn  ESRD  on HD (MWF): HD yesterday - Nephro on board, appreciate recommendations.   HTN:  - Continue carvedilol to 6.25 mg BID instead of qd and mdur   Type II DM: Last Hgb A1c 7.8 in June 2019.  - Lantus 8 units qd  - SSI sensitive  - CBG monitoring   Dispo: Anticipated discharge pending clinical improvement.   Mike Craze, DO 11/04/2018, 7:01 AM Pager: [phone]

## 2018-11-05 LAB — CBC
HCT: 33.6 % — ABNORMAL LOW (ref 39.0–52.0)
Hemoglobin: 10.8 g/dL — ABNORMAL LOW (ref 13.0–17.0)
MCH: 25.4 pg — ABNORMAL LOW (ref 26.0–34.0)
MCHC: 32.1 g/dL (ref 30.0–36.0)
MCV: 79.1 fL — ABNORMAL LOW (ref 80.0–100.0)
Platelets: 387 10*3/uL (ref 150–400)
RBC: 4.25 MIL/uL (ref 4.22–5.81)
RDW: 17.5 % — ABNORMAL HIGH (ref 11.5–15.5)
WBC: 17.3 10*3/uL — ABNORMAL HIGH (ref 4.0–10.5)
nRBC: 0 % (ref 0.0–0.2)

## 2018-11-05 LAB — GLUCOSE, CAPILLARY
Glucose-Capillary: 280 mg/dL — ABNORMAL HIGH (ref 70–99)
Glucose-Capillary: 145 mg/dL — ABNORMAL HIGH (ref 70–99)
Glucose-Capillary: 264 mg/dL — ABNORMAL HIGH (ref 70–99)

## 2018-11-05 LAB — BASIC METABOLIC PANEL
Anion gap: 17 — ABNORMAL HIGH (ref 5–15)
BUN: 39 mg/dL — ABNORMAL HIGH (ref 6–20)
CO2: 22 mmol/L (ref 22–32)
Calcium: 8.7 mg/dL — ABNORMAL LOW (ref 8.9–10.3)
Chloride: 89 mmol/L — ABNORMAL LOW (ref 98–111)
Creatinine, Ser: 10.85 mg/dL — ABNORMAL HIGH (ref 0.61–1.24)
GFR calc Af Amer: 6 mL/min — ABNORMAL LOW (ref >60)
GFR calc non Af Amer: 5 mL/min — ABNORMAL LOW (ref >60)
Glucose, Bld: 447 mg/dL — ABNORMAL HIGH (ref 70–99)
Potassium: 3.7 mmol/L (ref 3.5–5.1)
Sodium: 128 mmol/L — ABNORMAL LOW (ref 135–145)

## 2018-11-05 MED ORDER — PENTAFLUOROPROP-TETRAFLUOROETH EX AERO: 1.0000 "application " | INHALATION_SPRAY | CUTANEOUS | Status: DC | PRN

## 2018-11-05 MED ORDER — SODIUM CHLORIDE 0.9 % IV SOLN: 100.0000 mL | INTRAVENOUS | Status: DC | PRN

## 2018-11-05 MED ORDER — LIDOCAINE HCL (PF) 1 % IJ SOLN: 5.0000 mL | INTRAMUSCULAR | Status: DC | PRN

## 2018-11-05 MED ORDER — LIDOCAINE-PRILOCAINE 2.5-2.5 % EX CREA: 1.0000 "application " | TOPICAL_CREAM | CUTANEOUS | Status: DC | PRN

## 2018-11-05 NOTE — Progress Notes (Addendum)
[facility]  Subjective  - No new complaints, some pain at TMA area.   Objective (!) 146/49 (!) 103 99.4 F (37.4 C) (Oral) (!) 23 94%  Intake/Output Summary (Last 24 hours) at 11/05/2018 1037 Last data filed at 11/05/2018 0404 Gross per 24 hour  Intake 600 ml  Output -  Net 600 ml    Right TMA lateral 0.5 cm opening with 4cm deep tunnel under suture line.  Purulent fluid expressed< 1 cc. Dry guaze packing with Q tip.  Assessment/Planning: Right TMA lateral 0.5 cm opening with 4 cm tunnel subcuticular.   Dry guaze packing placed today.  WBC improved 17.3 down from 21.0  Continue antibiotics and Heparin for now.  Dr. Carlis Abbott will examine the patient tomorrow.  If we can get this to heal with antibiotics we will, otherwise he may have further amputation surgery.  Roxy Horseman 11/05/2018 10:37 AM --  Laboratory Lab Results: Recent Labs    11/04/18 0252 11/05/18 0342  WBC 21.0* 17.3*  HGB 11.2* 10.8*  HCT 34.5* 33.6*  PLT 390 387   BMET Recent Labs    11/03/18 1058 11/04/18 0252  NA 124* 128*  K 3.8 3.7  CL 89* 89*  CO2 16* 22  GLUCOSE 362* 447*  BUN 81* 39*  CREATININE 18.45* 10.85*  CALCIUM 7.7* 8.7*    COAG Lab Results  Component Value Date   INR 1.25 11/04/2017   INR 1.00 10/30/2017   INR 1.05 09/13/2014   No results found for: PTT  Wound probed with a q-tip and it went almost all the way across the foot.  Will need daily dressing changes.  Discussed the high possibility of a more proximal amputation.     Annamarie Major

## 2018-11-05 NOTE — Progress Notes (Signed)
Asked to review his January echo in light of the MRSA +ve blood cultures. A small echodensity is indeed present on the mitral annulus, but there is extremely heavy annular calcification with extensive and severe imaging artifact. Clinically, it is highly improbable that he has had S. Aureus endocarditis for over 2 month, but obviously he has a new acute infection. Unfortunately, I do not think a repeat transthoracic echo would be useful to identify endocarditis: it will likely be equivocal. Only a TEE might help and even then, uncertainty might persist. Clearly, the MRSA infection source is likely to be his foot. In the general context of limiting procedures that could cause aerosolized infectious particles, I would recommend against TEE, with empirical extensive-course IV antibiotics (4-6 weeks) "as if" endocarditis were present. If osteomyelitis or deep soft tissue infection is present, this will be indicated anyway. Antibiotics can be administered intermittently with dialysis.  Please ask the ID service if they agree that this is a reasonable approach. Will be glad to discuss further.  Sanda Klein, MD, [facility] [phone] office [phone] pager

## 2018-11-05 NOTE — Progress Notes (Addendum)
[facility] Progress Note   Subjective: No C/Os. Feels better.   Objective Vitals:   11/04/18 0551 11/04/18 1500 11/04/18 2050 11/05/18 0356  BP: (!) 143/81 128/69 (!) 146/49   Pulse: (!) 107 (!) 103    Resp: (!) 23     Temp: 100.2 F (37.9 C) 99.3 F (37.4 C) 97.7 F (36.5 C) 99.4 F (37.4 C)  TempSrc:  Oral Oral Oral  SpO2: 100% 94%    Weight: 78.3 kg   79.9 kg  Height:       Physical Exam General: Chronically ill appearing male in NAD Skin: Hyperpigmented lesions across shoulder and down arms.  Heart: S1,S2 Tachy-ST on monitor rate 110-118 Lungs: few bibasilar crackles, slightly decreased LLL. No WOB Abdomen: Soft, NT Extremities: L BKA R TMA-decreased swelling and erythema from yesterday, no drainage.  Dialysis Access: L AVF + bruit   Additional Objective Labs: Basic Metabolic Panel: Recent Labs  Lab 11/03/18 1058 11/04/18 0252  NA 124* 128*  K 3.8 3.7  CL 89* 89*  CO2 16* 22  GLUCOSE 362* 447*  BUN 81* 39*  CREATININE 18.45* 10.85*  CALCIUM 7.7* 8.7*  PHOS 4.0  --    Liver Function Tests: Recent Labs  Lab 11/03/18 1058  AST 22  ALT 11  ALKPHOS 72  BILITOT 0.6  PROT 7.7  ALBUMIN 2.7*   No results for input(s): LIPASE, AMYLASE in the last 168 hours. CBC: Recent Labs  Lab 11/03/18 0848 11/04/18 0252 11/05/18 0342  WBC 18.4* 21.0* 17.3*  NEUTROABS 15.8*  --   --   HGB 12.1* 11.2* 10.8*  HCT 35.0* 34.5* 33.6*  MCV 77.1* 78.6* 79.1*  PLT 426* 390 387   Blood Culture    Component Value Date/Time   SDES BLOOD RIGHT ARM 11/04/2018 1007   SPECREQUEST  11/04/2018 1007    BOTTLES DRAWN AEROBIC ONLY Blood Culture adequate volume   CULT  11/04/2018 1007    NO GROWTH < 24 HOURS Performed at [facility] [address], [place], [place] [address]    REPTSTATUS PENDING 11/04/2018 1007    Cardiac Enzymes: Recent Labs  Lab 11/03/18 1412 11/03/18 2145 11/04/18 0252  TROPONINI 1.46* 1.66* 1.32*   CBG: Recent Labs   Lab 11/03/18 2145 11/04/18 0757 11/04/18 1646 11/04/18 2125 11/05/18 0755  GLUCAP 291* 357* 198* 233* 280*   Iron Studies: No results for input(s): IRON, TIBC, TRANSFERRIN, FERRITIN in the last 72 hours. @lablastinr3 @ Studies/Results: Dg Chest 2 View  Result Date: 11/03/2018 CLINICAL DATA:  Shortness of breath.  Chest pain. EXAM: CHEST - 2 VIEW COMPARISON:  09/07/2018. FINDINGS: Prior median sternotomy and CABG. Cardiomegaly with normal pulmonary vascularity. Mild right perihilar infiltrate can not be excluded. No pleural effusion or pneumothorax IMPRESSION: 1.  Prior CABG. 2.  Mild right perihilar infiltrate can not be excluded. Electronically Signed   By: Marcello Moores  Register   On: 11/03/2018 10:42   Dg Foot Complete Right  Result Date: 11/03/2018 CLINICAL DATA:  Foot pain.  Fever. EXAM: RIGHT FOOT COMPLETE - 3+ VIEW COMPARISON:  Right foot x-rays dated October 04, 2018. FINDINGS: Postsurgical changes related to transmetatarsal amputation again noted. Surgical margins are maintained. No osseous destruction or periosteal reaction. No acute fracture or dislocation. Joint spaces are preserved. Osteopenia. Soft tissue swelling of the stump with small amount of subcutaneous emphysema overlying the residual second through fourth metatarsals. Vascular calcifications. IMPRESSION: 1. Prior transmetatarsal amputation with stump soft tissue swelling and new subcutaneous emphysema overlying  the residual second through fourth metatarsals, concerning for infection. No radiographic evidence of osteomyelitis. Electronically Signed   By: Titus Dubin M.D.   On: 11/03/2018 10:46   Medications: . sodium chloride    . sodium chloride     . aspirin EC  81 mg Oral Daily  . atorvastatin  80 mg Oral q1800  . [START ON 11/06/2018] calcitRIOL  1.25 mcg Oral Q M,W,F-HD  . carvedilol  6.25 mg Oral BID  . Chlorhexidine Gluconate Cloth  6 each Topical Q0600  . clopidogrel  75 mg Oral Daily  . feeding supplement  (PRO-STAT SUGAR FREE 64)  30 mL Oral BID  . heparin injection (subcutaneous)  5,000 Units Subcutaneous Q8H  . insulin aspart  0-9 Units Subcutaneous TID WC  . insulin glargine  8 Units Subcutaneous Daily  . isosorbide mononitrate  30 mg Oral Daily  . lanthanum  1,000 mg Oral TID WC  . multivitamin  1 tablet Oral QHS  . vancomycin variable dose per unstable renal function (pharmacist dosing)   Does not apply See admin instructions   [facility] MWF 4 hrs 200NRe 500/800 77.5 kg 2.0 K/ 3.5 Ca UFP 2 L AVF Hep 2500 units IV TIW -Calcitriol 1.25 mcg PO TIW -Venofer 100 mg IV weekly (last dose 10/25/18 Last Tsat 24 10/25/18)   Assessment/Plan: 1. R TMA foot infection - w/ fevers/ ^WBC/ drainage, on IV Vanc/Maxipime. 1/2 blood cx's from 3/20 +MRSA. Seen by VVS, piece of retained guaze removed. Temp/ WBC improving on abx.  Per VVS , may need further amputation if doesn't respond to IV abx. No revascularization options.  2. MRSA bacteremia - seen by cardiology, prob due to foot infection, see their notes. They recommend not doing TEE due to aerosolizing risk of procedure and also likelihood that would not change Rx / IV abx for foot infection.  3. Chest Pain-Troponin 1.97, T wave inversion anterior leads. Cardiology consulted. Demand ischemia in setting of infection/hemodyamic instability. Not ASC. Cardiology signed off. 4. Hyperglycemia-BS 447 AG 17 Co2 22. Per primary. 5. ESRD - MWF. Next HD 11/06/18.  Usual Heparin. K+ 3.7-Unable to use high K bath D/T being on 3.5 Ca bath.  6. Hypertension/volume - Very hypertensive on admission. Home meds resumed. Better controlled today. HD 03/20 Net UF 2.5 Post wt 78.3 kg Still above OP EDW. Continue lowering volume as tolerated.  7. Anemia - HGB 10.8.  No ESA needed. Hold venofer in setting of possible sepsis. 8. Metabolic bone disease - Ca 8.7 C Ca 8.7. Phos at goal. Uses 3.5 Ca bath. Continue binders, VDRA.  9. Nutrition -Albumin 2.7.Renal/Carb mod  diet, prostat, renal vits. 10. PAD-S/P L BKA, R TMA 11. Combined systolic and diastolic HF-no evidence of volume overload by exam.  12. CAD H/O CABG X 3   Rita H. Brown NP-C 11/05/2018, 8:36 AM  [facility] [phone]  Pt seen, examined and agree w A/P as above.  [facility] 11/05/2018, 2:16 PM

## 2018-11-05 NOTE — Progress Notes (Signed)
   Subjective: Doing well today. No complaints  Objective:  Vital signs in last 24 hours: Vitals:   11/04/18 0551 11/04/18 1500 11/04/18 2050 11/05/18 0356  BP: (!) 143/81 128/69 (!) 146/49   Pulse: (!) 107 (!) 103    Resp: (!) 23     Temp: 100.2 F (37.9 C) 99.3 F (37.4 C) 97.7 F (36.5 C) 99.4 F (37.4 C)  TempSrc:  Oral Oral Oral  SpO2: 100% 94%    Weight: 78.3 kg   79.9 kg  Height:       Physical Exam Constitutional: NAD, appears comfortable Cardiovascular: RRR, no murmurs, rubs, or gallops.  Pulmonary/Chest: CTAB, no wheezes, rales, or rhonchi.  Abdominal: Soft, non tender, non distended. +BS.  Extremities: Left BKA, right transmetatarsal amputation with som erythema, minimal swelling, no drainage.  Skin: Multiple hyperpigmented skin lesions with excoriations and ulceration over his chest, upper extremities, and back.  Psychiatric: Normal mood and affect   Assessment/Plan:  Principal Problem:   MRSA bacteremia Active Problems:   NSTEMI (non-ST elevated myocardial infarction) ([place])   ESRD on dialysis ([place])   Essential hypertension   Chronic combined systolic and diastolic CHF (congestive heart failure) (H[facility])   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[place])   Infection of amputation stump, right lower extremity [facility])  [name] is a 43 year old male with ESRD on HD, chronic combined systolic and diastolic heart failure, HTN, HLD, type II DM, CAD s/p CABG x3 in 2019 and severe PAD s/p left BKA and right partial foot amputation presenting with a fever, chest pain and right transmetatarsal amputation foot pain.   MRSA Bacteremia Source is most likely skin and soft tissue infection of his right transmetatarsal stump site. However echocardiogram two month ago showed a small oscillating structure on his mitral annulus. Discussed with cardiology. Given the heavy annular calcification, it is unlikely this is true infectious endocarditis. Repeat echo would likely be  equivocal and not recommended. Given current COVID pandemic and attempt to limit TEEs, cardiology recommended treating empirically for endocarditis (4-6 weeks) dosing vancomycin with dialysis.   -- VVS consulted; appreciate recommendations. No plans for surgical intervention at this time. Next step would be BKA.  -- Continue IV vancomycin (day 3) -- Repeat blood cultures 3/21 NGTD   Chest Pain: S/p CABG x 3 one year ago. Troponins peaked at 1.66 and have remained "flat". Felt to be demand ischemia with infection and underlying CAD. No further work up.   PVD: S/p left BKA and right transmetatarsal amputation with skin and soft tissue infection.  -- VVS following, appreciate recommendations  -- Continue ASA & plavix  -- Continue Lipitor   ESRD on HD (MWF):  -- Nephrology consulted; appreciate assistance  -- Continue calcitriol & fosrenol  HTN:  -- Continue imdur 30 mg daily  -- Continue coreg 6.25 mg daily   Type II DM: Prescribed taking lantus 14 units QHS at home.  -- Increase lantus 8-> 12 units daily -- SSI-Sensitive TID AC  FEN: Fluid restrict, lytes per HD, renal diet  VTE ppx: Subq heparin  Code Status: FULL   Dispo: Anticipated discharge pending work up and treatment of endocarditis.   Velna Ochs, MD 11/05/2018, 7:42 AM Pager: [phone]

## 2018-11-05 NOTE — Consult Note (Signed)
[facility] Nurse wound consult note Reason for Consult:Right lateral stump incision wound. Seen by Dr. Trula Slade this morning and packed with dry gauze.  Dr. Carlis Abbott to see in am and determine if further amputation is needed. Linear wound on pretibial area is clean, dry. Wound type: infectious  WOC Nursing and Vascular Surgery are simultaneously consulted. WOC Nursing defers to VVS.  [facility] nursing team will not follow, but will remain available to this patient, the nursing and medical teams.  Please re-consult if needed. Thanks, Maudie Flakes, MSN, RN, [place], Arther Abbott  Pager# [phone]

## 2018-11-06 DIAGNOSIS — B9562 Methicillin resistant Staphylococcus aureus infection as the cause of diseases classified elsewhere: Secondary | ICD-10-CM

## 2018-11-06 DIAGNOSIS — E10628 Type 1 diabetes mellitus with other skin complications: Secondary | ICD-10-CM

## 2018-11-06 DIAGNOSIS — Z91018 Allergy to other foods: Secondary | ICD-10-CM

## 2018-11-06 DIAGNOSIS — T8743 Infection of amputation stump, right lower extremity: Principal | ICD-10-CM

## 2018-11-06 DIAGNOSIS — Z89421 Acquired absence of other right toe(s): Secondary | ICD-10-CM

## 2018-11-06 DIAGNOSIS — E1022 Type 1 diabetes mellitus with diabetic chronic kidney disease: Secondary | ICD-10-CM

## 2018-11-06 DIAGNOSIS — L089 Local infection of the skin and subcutaneous tissue, unspecified: Secondary | ICD-10-CM

## 2018-11-06 DIAGNOSIS — E1051 Type 1 diabetes mellitus with diabetic peripheral angiopathy without gangrene: Secondary | ICD-10-CM

## 2018-11-06 DIAGNOSIS — N186 End stage renal disease: Secondary | ICD-10-CM

## 2018-11-06 DIAGNOSIS — I2510 Atherosclerotic heart disease of native coronary artery without angina pectoris: Secondary | ICD-10-CM

## 2018-11-06 DIAGNOSIS — Z89512 Acquired absence of left leg below knee: Secondary | ICD-10-CM

## 2018-11-06 DIAGNOSIS — F17210 Nicotine dependence, cigarettes, uncomplicated: Secondary | ICD-10-CM

## 2018-11-06 DIAGNOSIS — Z951 Presence of aortocoronary bypass graft: Secondary | ICD-10-CM

## 2018-11-06 DIAGNOSIS — Z992 Dependence on renal dialysis: Secondary | ICD-10-CM

## 2018-11-06 DIAGNOSIS — I5042 Chronic combined systolic (congestive) and diastolic (congestive) heart failure: Secondary | ICD-10-CM

## 2018-11-06 DIAGNOSIS — Z794 Long term (current) use of insulin: Secondary | ICD-10-CM

## 2018-11-06 DIAGNOSIS — Z872 Personal history of diseases of the skin and subcutaneous tissue: Secondary | ICD-10-CM

## 2018-11-06 DIAGNOSIS — R7881 Bacteremia: Secondary | ICD-10-CM

## 2018-11-06 LAB — BASIC METABOLIC PANEL
Anion gap: 16 — ABNORMAL HIGH (ref 5–15)
BUN: 78 mg/dL — ABNORMAL HIGH (ref 6–20)
CO2: 20 mmol/L — ABNORMAL LOW (ref 22–32)
Calcium: 7.7 mg/dL — ABNORMAL LOW (ref 8.9–10.3)
Chloride: 86 mmol/L — ABNORMAL LOW (ref 98–111)
Creatinine, Ser: 14.16 mg/dL — ABNORMAL HIGH (ref 0.61–1.24)
GFR calc Af Amer: 4 mL/min — ABNORMAL LOW (ref >60)
GFR calc non Af Amer: 4 mL/min — ABNORMAL LOW (ref >60)
Glucose, Bld: 248 mg/dL — ABNORMAL HIGH (ref 70–99)
Potassium: 4.1 mmol/L (ref 3.5–5.1)
Sodium: 122 mmol/L — ABNORMAL LOW (ref 135–145)

## 2018-11-06 LAB — CULTURE, BLOOD (ROUTINE X 2): Special Requests: ADEQUATE

## 2018-11-06 LAB — CBC
HCT: 31.2 % — ABNORMAL LOW (ref 39.0–52.0)
Hemoglobin: 10.1 g/dL — ABNORMAL LOW (ref 13.0–17.0)
MCH: 25.8 pg — ABNORMAL LOW (ref 26.0–34.0)
MCHC: 32.4 g/dL (ref 30.0–36.0)
MCV: 79.8 fL — ABNORMAL LOW (ref 80.0–100.0)
Platelets: 376 10*3/uL (ref 150–400)
RBC: 3.91 MIL/uL — ABNORMAL LOW (ref 4.22–5.81)
RDW: 17.4 % — ABNORMAL HIGH (ref 11.5–15.5)
WBC: 15.8 10*3/uL — ABNORMAL HIGH (ref 4.0–10.5)
nRBC: 0 % (ref 0.0–0.2)

## 2018-11-06 LAB — GLUCOSE, CAPILLARY
Glucose-Capillary: 136 mg/dL — ABNORMAL HIGH (ref 70–99)
Glucose-Capillary: 196 mg/dL — ABNORMAL HIGH (ref 70–99)
Glucose-Capillary: 310 mg/dL — ABNORMAL HIGH (ref 70–99)
Glucose-Capillary: 251 mg/dL — ABNORMAL HIGH (ref 70–99)

## 2018-11-06 MED ORDER — CALCITRIOL 0.5 MCG PO CAPS
ORAL_CAPSULE | ORAL | Status: AC
  Filled 2018-11-06: qty 2

## 2018-11-06 MED ORDER — ACETAMINOPHEN 325 MG PO TABS
ORAL_TABLET | ORAL | Status: AC
  Filled 2018-11-06: qty 2

## 2018-11-06 MED ORDER — PROCHLORPERAZINE EDISYLATE 10 MG/2ML IJ SOLN
5.0000 mg | Freq: Once | INTRAMUSCULAR | Status: AC
  Administered 2018-11-06: 5 mg via INTRAVENOUS
  Filled 2018-11-06: qty 2

## 2018-11-06 MED ORDER — CALCITRIOL 0.25 MCG PO CAPS
ORAL_CAPSULE | ORAL | Status: AC
  Filled 2018-11-06: qty 1

## 2018-11-06 MED ORDER — VANCOMYCIN HCL 1000 MG IV SOLR
1000.0000 mg | INTRAVENOUS | Status: DC
  Administered 2018-11-06: 1000 mg via INTRAVENOUS
  Filled 2018-11-06: qty 1000

## 2018-11-06 MED ORDER — HEPARIN SODIUM (PORCINE) 1000 UNIT/ML IJ SOLN
INTRAMUSCULAR | Status: AC
  Administered 2018-11-06: 2500 [IU]
  Filled 2018-11-06: qty 3

## 2018-11-06 NOTE — Progress Notes (Signed)
[facility]  Subjective  - No complaints.  Seen in dialysis.   Objective (!) 147/67 88 99.1 F (37.3 C) (Oral) 19 93%  Intake/Output Summary (Last 24 hours) at 11/06/2018 0954 Last data filed at 11/06/2018 9798 Gross per 24 hour  Intake 960 ml  Output -  Net 960 ml    Right TMA lateral wound open - medial well healed - some minimal purulent drainage  Laboratory Lab Results: Recent Labs    11/05/18 0342 11/06/18 0410  WBC 17.3* 15.8*  HGB 10.8* 10.1*  HCT 33.6* 31.2*  PLT 387 376   BMET Recent Labs    11/04/18 0252 11/06/18 0410  NA 128* 122*  K 3.7 4.1  CL 89* 86*  CO2 22 20*  GLUCOSE 447* 248*  BUN 39* 78*  CREATININE 10.85* 14.16*  CALCIUM 8.7* 7.7*    COAG Lab Results  Component Value Date   INR 1.25 11/04/2017   INR 1.00 10/30/2017   INR 1.05 09/13/2014   No results found for: PTT  Assessment/Planning:  43 yo M s/p RLE revascularization and TMA for CLI with tissue loss.  Now with MRSA bacteremia likely related to non-healing lateral area of TMA.  Discussed next step is BKA.  He does not want another surgery and explained low expectation that this will heal with wound care.  At least afebrile over past 24 hours and WBC count improving.  Continue packing lateral wound on foot.  He will keep thinking about his options.  [name] 11/06/2018 9:54 AM --

## 2018-11-06 NOTE — Procedures (Signed)
I was present at this dialysis session. I have reviewed the session itself and made appropriate changes.   Vital signs in last 24 hours:  Temp:  [98.8 F (37.1 C)-99.9 F (37.7 C)] 98.9 F (37.2 C) (03/23 0514) Pulse Rate:  [89-91] 91 (03/23 0514) Resp:  [16-22] 22 (03/23 0514) BP: (119-144)/(47-51) 144/47 (03/23 0514) SpO2:  [96 %] 96 % (03/22 2205) Weight:  [83.3 kg] 83.3 kg (03/23 0514) Weight change: 3.4 kg Filed Weights   11/04/18 0551 11/05/18 0356 11/06/18 0514  Weight: 78.3 kg 79.9 kg 83.3 kg    Recent Labs  Lab 11/03/18 1058  11/06/18 0410  NA 124*   < > 122*  K 3.8   < > 4.1  CL 89*   < > 86*  CO2 16*   < > 20*  GLUCOSE 362*   < > 248*  BUN 81*   < > 78*  CREATININE 18.45*   < > 14.16*  CALCIUM 7.7*   < > 7.7*  PHOS 4.0  --   --    < > = values in this interval not displayed.    Recent Labs  Lab 11/03/18 0848 11/04/18 0252 11/05/18 0342 11/06/18 0410  WBC 18.4* 21.0* 17.3* 15.8*  NEUTROABS 15.8*  --   --   --   HGB 12.1* 11.2* 10.8* 10.1*  HCT 35.0* 34.5* 33.6* 31.2*  MCV 77.1* 78.6* 79.1* 79.8*  PLT 426* 390 387 376    Scheduled Meds: . aspirin EC  81 mg Oral Daily  . atorvastatin  80 mg Oral q1800  . calcitRIOL  1.25 mcg Oral Q M,W,F-HD  . carvedilol  6.25 mg Oral BID  . Chlorhexidine Gluconate Cloth  6 each Topical Q0600  . clopidogrel  75 mg Oral Daily  . feeding supplement (PRO-STAT SUGAR FREE 64)  30 mL Oral BID  . heparin injection (subcutaneous)  5,000 Units Subcutaneous Q8H  . insulin aspart  0-9 Units Subcutaneous TID WC  . insulin glargine  8 Units Subcutaneous Daily  . isosorbide mononitrate  30 mg Oral Daily  . lanthanum  1,000 mg Oral TID WC  . multivitamin  1 tablet Oral QHS  . vancomycin variable dose per unstable renal function (pharmacist dosing)   Does not apply See admin instructions   Continuous Infusions: . sodium chloride    . sodium chloride     PRN Meds:.sodium chloride, sodium chloride, acetaminophen **OR**  acetaminophen, lidocaine (PF), lidocaine-prilocaine, morphine, pentafluoroprop-tetrafluoroeth     [facility] MWF 4 hrs 200NRe 500/800 77.5 kg 2.0 K/ 3.5 Ca UFP 2 L AVF Hep 2500units IV TIW -Calcitriol 1.25 mcg PO TIW -Venofer 100 mg IV weekly (last dose 10/25/18 Last Tsat 24 10/25/18)  Assessment and Plan: 1. Infected right transmet amputation- on vanc/maxipime. VVS following and may need further amputation. 2. MRSA bacteremia- hold off on TEE for now and continue with IV abx.  3. ESRD- cont with HD qMWF 4. HTN/volume- improved with UF.  Donetta Potts,  MD 11/06/2018, 8:44 AM

## 2018-11-06 NOTE — Progress Notes (Signed)
   Subjective: [name] continues to have pain in his right foot. States it has continued to drain. He is hesitant to any more surgeries at this point. All questions and concerns addressed.  Objective:  Vital signs in last 24 hours: Vitals:   11/05/18 0936 11/05/18 1433 11/05/18 2205 11/06/18 0514  BP:  (!) 119/48 (!) 128/51 (!) 144/47  Pulse:   89 91  Resp: 16 16  (!) 22  Temp:  99.9 F (37.7 C) 98.8 F (37.1 C) 98.9 F (37.2 C)  TempSrc:  Oral Oral Oral  SpO2:   96%   Weight:    83.3 kg  Height:       Physical Exam General: seen during HD, no distress CV: RRR, no murmurs Abdomen: soft, bowel sounds present, non distended Ext: Left BKA, right transmetatarsal amputation with erythema and some drainage on lateral end  Assessment/Plan:  Principal Problem:   MRSA bacteremia Active Problems:   NSTEMI (non-ST elevated myocardial infarction) ([place])   ESRD on dialysis ([place])   Essential hypertension   Chronic combined systolic and diastolic CHF (congestive heart failure) ([facility])   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[facility])   Infection of amputation stump, right lower extremity [facility])  [name] is a 43 year old male with ESRD on HD, chronic combined systolic and diastolic heart failure, HTN, HLD, type II DM, CAD s/p CABG x3 in 2019 and severe PAD s/p left BKA and right partial foot amputation presenting with a fever, chest pain and right transmetatarsal amputation foot pain.   MRSA Bacteremia Source is most likely skin and soft tissue infection of his right transmetatarsal stump site. Echo two months ago showed a small oscillating structure on mitral annulus and heavy annular calcification; unlikely, true infectious endocarditis. Given current COVID pandemic and attempt to limit TEEs, cardiology recommended treating empirically for endocarditis (4-6 weeks) dosing vancomycin with dialysis.   -- VVS consulted; appreciate recommendations. Discussed next step is BKA and patient  states he does not want another surgery at this time. -- Continue IV vancomycin (day 4) -- Repeat blood cultures 3/21 NGTD   PVD: S/p left BKA and right transmetatarsal amputation with skin and soft tissue infection.  -- VVS following, appreciate recommendations  -- Continue ASA & plavix  -- Continue Lipitor   ESRD on HD (MWF):  -- Nephrology consulted; appreciate assistance  -- Continue calcitriol & fosrenol  HTN:  -- Continue imdur 30 mg daily  -- Continue coreg 6.25 mg bid  Type II DM: Prescribed taking lantus 14 units QHS at home.  -- Continue lantus12 units daily -- SSI-Sensitive TID AC  Dispo: Anticipated discharge pending clinical improvement.   Mike Craze, DO 11/06/2018, 6:52 AM Pager: [phone]

## 2018-11-06 NOTE — Consult Note (Addendum)
[place] for Infectious Disease    Date of Admission:  11/03/2018     Total days of antibiotics 3 Vancomycin            Reason for Consult: MRSA Bacteremia    Referring Provider: CHAMP Primary Care Provider: System, Pcp Not In   Assessment: [name] is a 43 year old diabetic dialysis dependent man with a chronic nonhealing surgical wound following transmetatarsal amputation about 2 months ago now found to have fever and MRSA bacteremia in 2/4 bottles.  His temperature and wbc curve are trending down and he has cleared his bacteremia after 24 hours of appropriate antibiotics. On exam he has no murmur, no implanted cardiac devices or valves. AVF is normal in appearance and working as expected. No artificial joints or other hardware and no signs/symptoms of other metastatic sites of infection. Source most likely infected surgical incision.   I suspect that he will elect to proceed with medical management of this infection to attempt to salvage the remainder of his foot.  If this is the case, we may consider repeating MRI of the foot without contrast to ensure there is no abscess and to further characterize if bone is involved. Will likely need minimum 4-6 weeks of IV treatment with vancomycin after HD sessions.  Transthoracic echocardiogram without any evidence of vegetations.    Further recommendations to follow pending patient's decision on whether or not to undergo higher up amputation as discussed by Dr. Carlis Abbott. It sounds as if blood flow is a challenge for him and likely will impact ability to treat with IV antibiotics alone.    Plan: 1. Continue vancomycin   2. Patient to decide on further surgery  3. Repeated blood cultures - clear @ 2d 4. TTE w/o vegetations   Principal Problem:   MRSA bacteremia Active Problems:   NSTEMI (non-ST elevated myocardial infarction) ([place])   ESRD on dialysis [facility])   Essential hypertension   Chronic combined systolic and diastolic CHF  (congestive heart failure) (HCC)   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[place])   Infection of amputation stump, right lower extremity ([place])   . aspirin EC  81 mg Oral Daily  . atorvastatin  80 mg Oral q1800  . calcitRIOL  1.25 mcg Oral Q M,W,F-HD  . carvedilol  6.25 mg Oral BID  . Chlorhexidine Gluconate Cloth  6 each Topical Q0600  . clopidogrel  75 mg Oral Daily  . feeding supplement (PRO-STAT SUGAR FREE 64)  30 mL Oral BID  . heparin injection (subcutaneous)  5,000 Units Subcutaneous Q8H  . insulin aspart  0-9 Units Subcutaneous TID WC  . insulin glargine  8 Units Subcutaneous Daily  . isosorbide mononitrate  30 mg Oral Daily  . lanthanum  1,000 mg Oral TID WC  . multivitamin  1 tablet Oral QHS  . vancomycin variable dose per unstable renal function (pharmacist dosing)   Does not apply See admin instructions    HPI: [name] Ostrand Sr. is a 43 y.o. male with past medical history significant for end-stage renal disease on dialysis (left AVF), systolic and diastolic heart failure, coronary artery disease (s/p CABGx3, 2019), peripheral arterial disease, diabetes.    [name] was admitted on the 20th with fevers from dialysis center.  While he was awaiting EMS transport he described substernal chest pain that lasted about 45 minutes, relieved ultimately by nitroglycerin tablet and aspirin.  He also had some diarrhea for 2 days prior  to hospitalization.  This has subsided.  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  In December 2019 he developed critical limb ischemia related to a hematoma with ensuing compartment syndrome to the right lower extremity.  This required fasciotomy with closure August 10, 2018.  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 Carlis Abbott.  From what I understand this was a high risk procedure considering his very limited blood flow.  On October 04, 2018 he presented to [facility] emergency room for right foot pain.  At the time he did not declare any fevers or drainage to the wound bed with staples intact and no evidence of infection -staples were removed 6 days later at routine follow-up office visit.  While out at dinner 1 night with his girlfriend he noticed that he was having significant bleeding from his right foot wound that he was unable to stop.  He sought care at [facility] emergency room again on February 28 -at this time there was some bleeding from the lateral aspect of the wound that appeared to stop after a pressure dressing was placed.  He was seen later for a wound check in the ER on March 14 where apparently he had some packing material left in the wound bed on the lateral aspect where he was previously bleeding.    Since this time he has had worsening pain and tenderness to the stump on the lateral crossing over to central incision. He has noticed bloody thick drainage.  He noted chills and fever over the last few days and was sent for further evaluation from his dialysis center.  Since receiving antibiotics he has felt better and has had no further fevers. He very much would like to do anything he can to salvage his foot and avoid further surgery.   Review of Systems: Review of Systems  Constitutional: Positive for fever. Negative for malaise/fatigue.  HENT: Negative for nosebleeds and sore throat.   Respiratory: Negative for cough and shortness of breath.   Cardiovascular: Negative for chest pain and leg swelling.  Gastrointestinal: Negative for abdominal pain and diarrhea.  Musculoskeletal: Positive for joint pain (R foot). Negative for myalgias.  Skin: Negative for rash.  Neurological: Negative for focal weakness and headaches.    Past Medical History:  Diagnosis Date  . Anemia   . Atherosclerosis of lower extremity ([place])   . Blood clot in vein    right calf  . CAD (coronary artery disease)   . Cataracts,  bilateral   . Chronic combined systolic and diastolic heart failure ([place])   . Complication of anesthesia   . Depression   . ESRD (end stage renal disease) on dialysis [facility])    "MWF [facility]" (03/08/2017)  . GERD (gastroesophageal reflux disease)   . Heart murmur   . History of blood transfusion    "related to OR"  . Hypertension   . Myocardial infarction ([place])     " light"  . Nonhealing surgical wound    nonviable tissue  . PONV (postoperative nausea and vomiting)   . S/P unilateral BKA (below knee amputation), left ([place])   . Type II diabetes mellitus ([place])   . Wears glasses     Social History   Tobacco Use  . Smoking status: Current Every Day Smoker    Packs/day: 0.50    Years: 26.00    Pack years: 13.00  Types: Cigarettes    Last attempt to quit: 08/17/2018    Years since quitting: 0.2  . Smokeless tobacco: Never Used  Substance Use Topics  . Alcohol use: Not Currently    Comment:  "haven't took a drink in over 5 years"  . Drug use: Yes    Types: Marijuana    Comment: occasional    Family History  Problem Relation Age of Onset  . Heart failure Mother   . Hypertension Mother    Allergies  Allergen Reactions  . Coconut Oil Anaphylaxis and Other (See Comments)    Can use topically, allergic to coconut foods     OBJECTIVE: Blood pressure (!) 127/54, pulse 93, temperature 99.1 F (37.3 C), temperature source Oral, resp. rate 19, height 6\' 2"  (1.88 m), weight 82 kg, SpO2 93 %.  Physical Exam Constitutional:      Appearance: He is not ill-appearing.     Comments: Resting in bed undergoing dialysis treatment.  Holding right foot up in the air grimacing.  HENT:     Mouth/Throat:     Mouth: Mucous membranes are moist.     Pharynx: Oropharynx is clear.  Eyes:     General: No scleral icterus.    Pupils: Pupils are equal, round, and reactive to light.  Cardiovascular:     Rate and Rhythm: Normal rate and regular rhythm.     Heart sounds: No murmur.   Pulmonary:     Effort: Pulmonary effort is normal.     Breath sounds: Normal breath sounds.  Abdominal:     General: There is no distension.     Palpations: Abdomen is soft.  Musculoskeletal:        General: No swelling.     Comments: Right lateral transmetatarsal incision with about a 1-1/2 cm opening draining bloody purulent discharge.  There is tenderness at this site tracking to the center of foot about 4 cm.  Skin graft shows some changes concerning for necrosis.   Neurological:     Mental Status: He is oriented to person, place, and time.  Psychiatric:        Mood and Affect: Mood normal.     Lab Results Lab Results  Component Value Date   WBC 15.8 (H) 11/06/2018   HGB 10.1 (L) 11/06/2018   HCT 31.2 (L) 11/06/2018   MCV 79.8 (L) 11/06/2018   PLT 376 11/06/2018    Lab Results  Component Value Date   CREATININE 14.16 (H) 11/06/2018   BUN 78 (H) 11/06/2018   NA 122 (L) 11/06/2018   K 4.1 11/06/2018   CL 86 (L) 11/06/2018   CO2 20 (L) 11/06/2018    Lab Results  Component Value Date   ALT 11 11/03/2018   AST 22 11/03/2018   ALKPHOS 72 11/03/2018   BILITOT 0.6 11/03/2018     Microbiology: Recent Results (from the past 240 hour(s))  Blood Culture (routine x 2)     Status: Abnormal   Collection Time: 11/03/18  8:54 AM  Result Value Ref Range Status   Specimen Description BLOOD RIGHT HAND  Final   Special Requests   Final    BOTTLES DRAWN AEROBIC AND ANAEROBIC Blood Culture adequate volume   Culture  Setup Time   Final    GRAM POSITIVE COCCI IN BOTH AEROBIC AND ANAEROBIC BOTTLES CRITICAL RESULT CALLED TO, READ BACK BY AND VERIFIED WITH: C AMEND PHARMD 11/03/18 2349 JDW Performed at [facility], [place] [address], [place], [place] [address]  Culture METHICILLIN RESISTANT STAPHYLOCOCCUS AUREUS (A)  Final   Report Status 11/06/2018 FINAL  Final   Organism ID, Bacteria METHICILLIN RESISTANT STAPHYLOCOCCUS AUREUS  Final      Susceptibility   Methicillin  resistant staphylococcus aureus - MIC*    CIPROFLOXACIN >=8 RESISTANT Resistant     ERYTHROMYCIN >=8 RESISTANT Resistant     GENTAMICIN <=0.5 SENSITIVE Sensitive     OXACILLIN >=4 RESISTANT Resistant     TETRACYCLINE <=1 SENSITIVE Sensitive     VANCOMYCIN 1 SENSITIVE Sensitive     TRIMETH/SULFA <=10 SENSITIVE Sensitive     CLINDAMYCIN <=0.25 SENSITIVE Sensitive     RIFAMPIN <=0.5 SENSITIVE Sensitive     Inducible Clindamycin NEGATIVE Sensitive     * METHICILLIN RESISTANT STAPHYLOCOCCUS AUREUS  Blood Culture ID Panel (Reflexed)     Status: Abnormal   Collection Time: 11/03/18  8:54 AM  Result Value Ref Range Status   Enterococcus species NOT DETECTED NOT DETECTED Final   Listeria monocytogenes NOT DETECTED NOT DETECTED Final   Staphylococcus species DETECTED (A) NOT DETECTED Final    Comment: CRITICAL RESULT CALLED TO, READ BACK BY AND VERIFIED WITH: C AMEND PHARMD 11/03/18 2349 JDW    Staphylococcus aureus (BCID) DETECTED (A) NOT DETECTED Final    Comment: Methicillin (oxacillin)-resistant Staphylococcus aureus (MRSA). MRSA is predictably resistant to beta-lactam antibiotics (except ceftaroline). Preferred therapy is vancomycin unless clinically contraindicated. Patient requires contact precautions if  hospitalized. CRITICAL RESULT CALLED TO, READ BACK BY AND VERIFIED WITH: C AMEND PHARMD 11/03/18 2349 JDW    Methicillin resistance DETECTED (A) NOT DETECTED Final    Comment: CRITICAL RESULT CALLED TO, READ BACK BY AND VERIFIED WITH: C AMEND PHARMD 11/03/18 2349 JDW    Streptococcus species NOT DETECTED NOT DETECTED Final   Streptococcus agalactiae NOT DETECTED NOT DETECTED Final   Streptococcus pneumoniae NOT DETECTED NOT DETECTED Final   Streptococcus pyogenes NOT DETECTED NOT DETECTED Final   Acinetobacter baumannii NOT DETECTED NOT DETECTED Final   Enterobacteriaceae species NOT DETECTED NOT DETECTED Final   Enterobacter cloacae complex NOT DETECTED NOT DETECTED Final    Escherichia coli NOT DETECTED NOT DETECTED Final   Klebsiella oxytoca NOT DETECTED NOT DETECTED Final   Klebsiella pneumoniae NOT DETECTED NOT DETECTED Final   Proteus species NOT DETECTED NOT DETECTED Final   Serratia marcescens NOT DETECTED NOT DETECTED Final   Haemophilus influenzae NOT DETECTED NOT DETECTED Final   Neisseria meningitidis NOT DETECTED NOT DETECTED Final   Pseudomonas aeruginosa NOT DETECTED NOT DETECTED Final   Candida albicans NOT DETECTED NOT DETECTED Final   Candida glabrata NOT DETECTED NOT DETECTED Final   Candida krusei NOT DETECTED NOT DETECTED Final   Candida parapsilosis NOT DETECTED NOT DETECTED Final   Candida tropicalis NOT DETECTED NOT DETECTED Final    Comment: Performed at [facility], [place]. [address] [place], [place] [address]  Blood Culture (routine x 2)     Status: None (Preliminary result)   Collection Time: 11/03/18  9:42 PM  Result Value Ref Range Status   Specimen Description BLOOD RIGHT HAND  Final   Special Requests   Final    BOTTLES DRAWN AEROBIC ONLY Blood Culture results may not be optimal due to an inadequate volume of blood received in culture bottles   Culture   Final    NO GROWTH 3 DAYS Performed at [facility], [place] [address], [place], [place] [address]    Report Status PENDING  Incomplete  Culture, blood (Routine X  2) w Reflex to ID Panel     Status: None (Preliminary result)   Collection Time: 11/04/18  2:50 AM  Result Value Ref Range Status   Specimen Description BLOOD RIGHT FOREARM  Final   Special Requests   Final    BOTTLES DRAWN AEROBIC AND ANAEROBIC Blood Culture adequate volume   Culture   Final    NO GROWTH 2 DAYS Performed at [facility], [address], [place], [place] [address]    Report Status PENDING  Incomplete  Culture, blood (Routine X 2) w Reflex to ID Panel     Status: None (Preliminary result)   Collection Time: 11/04/18 10:07 AM  Result Value Ref Range Status   Specimen Description  BLOOD RIGHT ARM  Final   Special Requests   Final    BOTTLES DRAWN AEROBIC ONLY Blood Culture adequate volume   Culture   Final    NO GROWTH 2 DAYS Performed at [facility], [place] [address] [place], [place] [address]    Report Status PENDING  Incomplete    Janene Madeira, MSN, NP-C [place] [facility] [place] [place] Cell: [phone] Pager: [phone]  11/06/2018 10:55 AM

## 2018-11-06 NOTE — Progress Notes (Signed)
Pt had vomited to previously digested food. He claims the vomiting episode is not unusual if he does not like his food. Requesting for Nausea medication. Text paged coverage [phone] . Awaiting for answer.

## 2018-11-06 NOTE — Progress Notes (Addendum)
Inpatient Diabetes Program Recommendations  AACE/ADA: New Consensus Statement on Inpatient Glycemic Control (2015)  Target Ranges:  Prepandial:   less than 140 mg/dL      Peak postprandial:   less than 180 mg/dL (1-2 hours)      Critically ill patients:  140 - 180 mg/dL   Lab Results  Component Value Date   GLUCAP 310 (H) 11/06/2018   HGBA1C 7.8 (H) 02/07/2018    Results for Boxley, Paras LEE SR. (MRN [id number]) as of 11/06/2018 19:29  Ref. Range 11/05/2018 07:55 11/05/2018 11:31 11/05/2018 22:06 11/06/2018 08:25 11/06/2018 17:02  Glucose-Capillary Latest Ref Range: 70 - 99 mg/dL 280 (H) 145 (H) 264 (H) 196 (H) 310 (H)       Review of Glycemic Control  Diabetes history: DM  Outpatient Diabetes medications: Lantus 14 units daily   Current orders for Inpatient glycemic control: Lantus 8 units daily                                                                          Novolog (0-9 units) tid ac    Met with patient about his diabetes management. He states he has a new MD at [facility] on [facility] he is seeing. He states currently his dialysis MD has been writing his scripts for his insulin.  He states he was diagnosed with type 1 diabetes at age 29. I asked if he is ONLY taking the Lantus daily at home (no short acting). He said he has not taken the short acting insulin "for a few years" only the once a day long acting.   He had HD today therefore Novolog correction not given with lunch/dinner likely contributing to his CBG at dinner of 310. He was covered with Novolog 7 units for that per Novolog sensitive correction scale.   Will assess blood glucose in am and if fasting cbg remains high may need to slightly increase lantus dose closer to his home dose. If eating better tomorrow (was just sick after ate dinner), perhaps consider addition of Novolog meal coverage depending on glucose trends.   -- Will follow during hospitalization.--  Jonna Clark RN,  MSN Diabetes Coordinator Inpatient Glycemic Control Team Team Pager: [phone] (8am-5pm)

## 2018-11-06 NOTE — Progress Notes (Signed)
PT Cancellation Note  Patient Details Name: [name]. MRN: [id number] DOB: 1975/09/18   Cancelled Treatment:    Reason Eval/Treat Not Completed: Patient declined, no reason specified attempted to work with patient, he politely declines due to fatigue from HD and severe pain at amputation site following wound cleaning and packing. He appears tachypenic and jumping in pain in bed when bedsheet/covers even touch his foot. Agreeable to PT in the morning when pain is hopefully more under control.    Deniece Ree PT, DPT, CBIS  Supplemental Physical Therapist [facility]    Pager [phone] Acute Rehab Office [phone]

## 2018-11-06 NOTE — Progress Notes (Signed)
Pharmacy Antibiotic Note  [name]. is a 43 y.o. male admitted on 11/03/2018 with sepsis.  Pharmacy dosing Vancomycin for MRSA bacteremia.   WBC 15.8 trending down.  Afebrile, Tm 99.9. Nephrology noted plan to continue HD MWF.   Plan: Vanc 1000 mg IV after each HD-MWF Monitor renal function/dialysis schedule, C&S and vanc levels as needed pre HD.   Height: 6\' 2"  (188 cm) Weight: 183 lb 10.3 oz (83.3 kg) IBW/kg (Calculated) : 82.2  Temp (24hrs), Avg:99.2 F (37.3 C), Min:98.8 F (37.1 C), Max:99.9 F (37.7 C)  Recent Labs  Lab 11/03/18 0848 11/03/18 1058 11/04/18 0252 11/05/18 0342 11/06/18 0410  WBC 18.4*  --  21.0* 17.3* 15.8*  CREATININE  --  18.45* 10.85*  --  14.16*  LATICACIDVEN 1.7  --   --   --   --     Estimated Creatinine Clearance: 8 mL/min (A) (by C-G formula based on SCr of 14.16 mg/dL (H)).    Allergies  Allergen Reactions  . Coconut Oil Anaphylaxis and Other (See Comments)    Can use topically, allergic to coconut foods     Antimicrobials this admission: Vanc 3/20 >>  Cefepime 3/20 >>3/20   3/20 BCx: MRSA 3/21 BCx: NGTD  Thank you for allowing pharmacy to be a part of this patient's care.  Threasa Alpha Clinical Pharmacist Please see AMION for all Pharmacists' Contact Phone Numbers 11/06/2018, 9:12 AM

## 2018-11-06 NOTE — Progress Notes (Signed)
PT Cancellation Note  Patient Details Name: [name] Postlewait Sr. MRN: [id number] DOB: 05/11/1976   Cancelled Treatment:    Reason Eval/Treat Not Completed: Patient at procedure or test/unavailable attempted to work with patient this morning but he was at HD. If time and schedule allow, will try to attempt back later today.   Deniece Ree PT, DPT, CBIS  Supplemental Physical Therapist [facility]    Pager [phone] Acute Rehab Office [phone]     Hunt Oris 11/06/2018, 1:29 PM

## 2018-11-07 ENCOUNTER — Ambulatory Visit: Payer: Medicaid Other | Admitting: Cardiology

## 2018-11-07 DIAGNOSIS — Z7902 Long term (current) use of antithrombotics/antiplatelets: Secondary | ICD-10-CM

## 2018-11-07 DIAGNOSIS — F17211 Nicotine dependence, cigarettes, in remission: Secondary | ICD-10-CM

## 2018-11-07 DIAGNOSIS — Z7982 Long term (current) use of aspirin: Secondary | ICD-10-CM

## 2018-11-07 DIAGNOSIS — T8140XA Infection following a procedure, unspecified, initial encounter: Secondary | ICD-10-CM

## 2018-11-07 LAB — BASIC METABOLIC PANEL
Anion gap: 13 (ref 5–15)
BUN: 39 mg/dL — ABNORMAL HIGH (ref 6–20)
CO2: 24 mmol/L (ref 22–32)
Calcium: 8.9 mg/dL (ref 8.9–10.3)
Chloride: 88 mmol/L — ABNORMAL LOW (ref 98–111)
Creatinine, Ser: 7.87 mg/dL — ABNORMAL HIGH (ref 0.61–1.24)
GFR calc Af Amer: 9 mL/min — ABNORMAL LOW (ref >60)
GFR calc non Af Amer: 8 mL/min — ABNORMAL LOW (ref >60)
Glucose, Bld: 299 mg/dL — ABNORMAL HIGH (ref 70–99)
Potassium: 3.4 mmol/L — ABNORMAL LOW (ref 3.5–5.1)
Sodium: 125 mmol/L — ABNORMAL LOW (ref 135–145)

## 2018-11-07 LAB — CBC
HCT: 30.9 % — ABNORMAL LOW (ref 39.0–52.0)
Hemoglobin: 9.8 g/dL — ABNORMAL LOW (ref 13.0–17.0)
MCH: 25.5 pg — ABNORMAL LOW (ref 26.0–34.0)
MCHC: 31.7 g/dL (ref 30.0–36.0)
MCV: 80.3 fL (ref 80.0–100.0)
Platelets: 377 10*3/uL (ref 150–400)
RBC: 3.85 MIL/uL — ABNORMAL LOW (ref 4.22–5.81)
RDW: 17.6 % — ABNORMAL HIGH (ref 11.5–15.5)
WBC: 17.9 10*3/uL — ABNORMAL HIGH (ref 4.0–10.5)
nRBC: 0 % (ref 0.0–0.2)

## 2018-11-07 LAB — GLUCOSE, CAPILLARY
Glucose-Capillary: 277 mg/dL — ABNORMAL HIGH (ref 70–99)
Glucose-Capillary: 178 mg/dL — ABNORMAL HIGH (ref 70–99)

## 2018-11-07 MED ORDER — VANCOMYCIN HCL 1000 MG IV SOLR: 1000.0000 mg | INTRAVENOUS | Status: DC

## 2018-11-07 MED ORDER — MORPHINE SULFATE 15 MG PO TABS: 15.0000 mg | ORAL_TABLET | ORAL | 0 refills | Status: AC | PRN

## 2018-11-07 MED ORDER — CARVEDILOL 6.25 MG PO TABS: 6.2500 mg | ORAL_TABLET | Freq: Two times a day (BID) | ORAL | 0 refills | Status: DC

## 2018-11-07 MED ORDER — CHLORHEXIDINE GLUCONATE CLOTH 2 % EX PADS: 6.0000 | MEDICATED_PAD | Freq: Every day | CUTANEOUS | Status: DC

## 2018-11-07 MED ORDER — DARBEPOETIN ALFA 60 MCG/0.3ML IJ SOSY: 60.0000 ug | PREFILLED_SYRINGE | INTRAMUSCULAR | Status: DC

## 2018-11-07 NOTE — Discharge Summary (Signed)
Name: [name] Copher Sr. MRN: [id number] DOB: [birth year] 43 y.o. PCP: Sandi Mariscal, MD  Date of Admission: 11/03/2018  8:23 AM Date of Discharge: 11/07/2018 Attending Physician: Lenice Pressman MD  Discharge Diagnosis: 1. MRSA Bacteremia 2. PVD s/p right transmetatarsal amputation 3. Chest pain 4. ESRD on HD 5. Type I DM 6. HTN  Discharge Medications: Allergies as of 11/07/2018      Reactions   Coconut Oil Anaphylaxis, Other (See Comments)   Can use topically, allergic to coconut foods       Medication List    TAKE these medications   acetaminophen 325 MG tablet Commonly known as:  TYLENOL Take 2 tablets (650 mg total) by mouth every 4 (four) hours as needed for headache or mild pain.   aspirin EC 81 MG tablet Take 1 tablet (81 mg total) by mouth daily.   atorvastatin 80 MG tablet Commonly known as:  LIPITOR Take 1 tablet (80 mg total) by mouth daily at 6 PM.   calcitRIOL 0.25 MCG capsule Commonly known as:  ROCALTROL Take 5 capsules (1.25 mcg total) by mouth every Monday, Wednesday, and Friday with hemodialysis.   carvedilol 6.25 MG tablet Commonly known as:  COREG Take 1 tablet (6.25 mg total) by mouth 2 (two) times daily for 30 days. What changed:  when to take this   clopidogrel 75 MG tablet Commonly known as:  Plavix Take 1 tablet (75 mg total) by mouth daily.   insulin glargine 100 UNIT/ML injection Commonly known as:  LANTUS Inject 0.1 mLs (10 Units total) into the skin at bedtime. What changed:  how much to take   isosorbide mononitrate 30 MG 24 hr tablet Commonly known as:  IMDUR Take 1 tablet (30 mg total) by mouth daily.   lanthanum 1000 MG chewable tablet Commonly known as:  FOSRENOL Chew 2 tablets (2,000 mg total) by mouth 3 (three) times daily with meals.   morphine 15 MG tablet Commonly known as:  MSIR Take 1 tablet (15 mg total) by mouth every 4 (four) hours as needed for up to 5 days for severe pain.   multivitamin Tabs tablet  Take 1 tablet by mouth at bedtime.   ondansetron 4 MG tablet Commonly known as:  ZOFRAN Take 4 mg by mouth every 6 (six) hours as needed for nausea or vomiting.   vancomycin 1,000 mg in sodium chloride 0.9 % 250 mL Inject 1,000 mg into the vein every Monday, Wednesday, and Friday with hemodialysis. For six weeks       Disposition and follow-up:   [name] Erasmo Leventhal Sr. was discharged from [facility] in Stable condition.  At the hospital follow up visit please address:  1.  Right transmetatarsal amputation- surgery recommended BKA, however patient declined. Please readdress with patient and discuss his options  2.  Labs / imaging needed at time of follow-up: none  3.  Pending labs/ test needing follow-up: none  Follow-up Appointments: Follow-up Information    [facility]              Follow up on 12/12/2018.   Why:  2:45 pm appointment with Colletta Maryland, NP. Please arrive 15 min early to register.  Contact information: [place] [phone]       [facility] Follow up.   Why:  Home Health Registered Nurse and Physical Therapy Contact information: [place] Follow up.  Why:  Wheelchair           Hospital Course by problem list: 1. MRSA Bacteremia 2. PVD s/p right transmetatarsal amputation- Patient presented with fever, chest pain and right transmetatarsal amputation foot pain. Foot xray showed possible infection. He was at the ED on 3/14 to remove gauze that was found in his right transmetatarsal amputation. Blood cultures grew MRSA and he was treated with IV vancomycin. Repeat blood cultures showed no growth. VVS consulted and advised a BKA however the patient declined any more surgery at this time. Plan to continue IV vancomycin with HD for six weeks.  3. Chest pain- Patient presented after a 45 min episode of non-radiating sharp substernal  chest pain that was relieved with nitroglycerin. Trops peaked at 1.66. EKG showed T wave inversions in the anterior leads. He was treated with IV heparin. Cardiology was consulted and felt it to be demand ischemia with infection and underlying CAD.  4. ESRD on HD- stable 5. Type I DM- stable  6. HTN- coreg was increased to 6.25 mg BID per cardiology  Discharge Vitals:   BP 133/75 (BP Location: Right Arm)   Pulse 97   Temp 98.1 F (36.7 C) (Oral)   Resp 19   Ht 6\' 2"  (1.88 m)   Wt 80.3 kg   SpO2 99%   BMI 22.73 kg/m   Pertinent Labs, Studies, and Procedures:   CBC Latest Ref Rng & Units 11/07/2018 11/06/2018 11/05/2018  WBC 4.0 - 10.5 K/uL 17.9(H) 15.8(H) 17.3(H)  Hemoglobin 13.0 - 17.0 g/dL 9.8(L) 10.1(L) 10.8(L)  Hematocrit 39.0 - 52.0 % 30.9(L) 31.2(L) 33.6(L)  Platelets 150 - 400 K/uL 377 376 387   CMP Latest Ref Rng & Units 11/07/2018 11/06/2018 11/04/2018  Glucose 70 - 99 mg/dL 299(H) 248(H) 447(H)  BUN 6 - 20 mg/dL 39(H) 78(H) 39(H)  Creatinine 0.61 - 1.24 mg/dL 7.87(H) 14.16(H) 10.85(H)  Sodium 135 - 145 mmol/L 125(L) 122(L) 128(L)  Potassium 3.5 - 5.1 mmol/L 3.4(L) 4.1 3.7  Chloride 98 - 111 mmol/L 88(L) 86(L) 89(L)  CO2 22 - 32 mmol/L 24 20(L) 22  Calcium 8.9 - 10.3 mg/dL 8.9 7.7(L) 8.7(L)  Total Protein 6.5 - 8.1 g/dL - - -  Total Bilirubin 0.3 - 1.2 mg/dL - - -  Alkaline Phos 38 - 126 U/L - - -  AST 15 - 41 U/L - - -  ALT 0 - 44 U/L - - -    Discharge Instructions: Discharge Instructions    Diet - low sodium heart healthy   Complete by:  As directed    Discharge instructions   Complete by:  As directed    [name], [name] were hospitalized due to an infection of your right transmetatarsal amputation. Surgery recommended a right below knee amputation. However, you have decided not to proceed with surgery at this time so we recommend continue IV antibiotics for six weeks that will be dosed with your hemodialysis. I have also sent in 5 days worth of pain medications.  I want you to follow up closely with your PCP to help you manage this.  You may be take morphine 1 tablet (15 mg) every 4 hours as needed for pain, up to 5 days.  Also, please note your Coreg dose has changed to 6.25 mg twice a day.  Please make sure to follow up with your PCP in one week.  Thank you for allowing [place] to be a part of your care!   Increase activity slowly  Complete by:  As directed       Signed: Latonga Ponder N, DO 11/09/2018, 10:12 AM

## 2018-11-07 NOTE — Progress Notes (Signed)
   Subjective: [name] reported that he is continuing to have pain in his right foot. He states at this point he is not interested in surgery. He is worried about pain management outside of the hospital. He was informed we can give him a short term supply of pain meds however he will need to closely follow with his PCP. All questions and concerns addressed.  Objective:  Vital signs in last 24 hours: Vitals:   11/06/18 1030 11/06/18 1100 11/06/18 1130 11/07/18 0518  BP: (!) 127/54 (!) 166/68 (!) 172/69 133/75  Pulse: 93 98 97 97  Resp:      Temp:    98.1 F (36.7 C)  TempSrc:    Oral  SpO2:    99%  Weight:    80.3 kg  Height:       Physical Exam Gen: seen comfortably laying in bed, no distress  CV: RRR, no murmurs  Abdomen: soft, bowel sounds present, non distended  Ext: left BKA, right transmetatarsal amputation with erythema and mild drainage on lateral end   Assessment/Plan:  Principal Problem:   MRSA bacteremia Active Problems:   NSTEMI (non-ST elevated myocardial infarction) (HCC)   ESRD on dialysis (HCC)   Essential hypertension   Chronic combined systolic and diastolic CHF (congestive heart failure) (HCC)   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  MRSA Bacteremia Source is most likely skin and soft tissue infection of his right transmetatarsal stump site. Echo two months ago showed a small oscillating structure on mitral annulus and heavy annular calcification; unlikely, true infectious endocarditis. Given current COVID pandemic and attempt to limit TEEs, cardiology and ID recommended treating empirically  for endocarditis (4-6 weeks) dosing vancomycin with dialysis. -- VVS and ID consulted; appreciate recommendations -- Continue IV vancomycin dosed with HD  -- Repeat blood cultures 3/21 NGTD   PVD:S/p left BKA and right transmetatarsal amputation with skin and soft tissue infection. Patient declines any more surgery at this point.  -- VVS following, appreciate recommendations  -- Continue ASA &plavix  -- Continue Lipitor   ESRD on HD (MWF): -- Nephrology consulted; appreciate assistance  -- Continue calcitriol & fosrenol  Type II HB:[id number] taking lantus 14 units QHS at home.  -- Continue lantus12 units daily -- SSI-Sensitive TID AC  Dispo: Patient is medically stable for discharge today.   Mike Craze, DO 11/07/2018, 6:43 AM Pager: [phone]

## 2018-11-07 NOTE — Progress Notes (Signed)
Inpatient Diabetes Program Recommendations  AACE/ADA: New Consensus Statement on Inpatient Glycemic Control (2015)  Target Ranges:  Prepandial:   less than 140 mg/dL      Peak postprandial:   less than 180 mg/dL (1-2 hours)      Critically ill patients:  140 - 180 mg/dL   Lab Results  Component Value Date   GLUCAP 178 (H) 11/07/2018   HGBA1C 7.8 (H) 02/07/2018    Review of Glycemic Control Results for [name], [name]. (MRN [id number]) as of 11/07/2018 11:46  Ref. Range 11/06/2018 08:25 11/06/2018 17:02 11/06/2018 21:43 11/07/2018 07:52 11/07/2018 11:12  Glucose-Capillary Latest Ref Range: 70 - 99 mg/dL 196 (H) 310 (H) 251 (H) 277 (H) 178 (H)   Diabetes history: DM Outpatient Diabetes medications: Lantus 14 units daily   Current orders for Inpatient glycemic control: Lantus 8 units daily                                                                          Novolog (0-9 units) tid ac  Inpatient Diabetes Program Recommendations:   -Increase Lantus to 11-12 units daily (80% of basal regimen)  Thank you, Nani Gasser. Laprecious Austill, RN, MSN, CDE  Diabetes Coordinator Inpatient Glycemic Control Team Team Pager [phone] (8am-5pm) 11/07/2018 11:54 AM

## 2018-11-07 NOTE — Discharge Instructions (Signed)
Take 5 units of lantus this evening. Then resume taking 10 units of lantus daily at bedtime starting tomorrow.

## 2018-11-07 NOTE — Progress Notes (Addendum)
[facility] Progress Note   Dialysis Orders: MWF 4 hrs 200NRe 500/800 77.5 kg 2.0 K/ 3.5 Ca UFP 2 L AVF Hep 2500units IV TIW -Calcitriol 1.25 mcg PO TIW -Venofer 100 mg IV weekly (last dose 10/25/18 Last Tsat 24 10/25/18)  Assessment/Plan: 1. R TMA infection/MRSA bacteremia - WBC elevated stable range - afebrile - on Vanc - no TEE due to COVID precautions/ VVS follwoing 2. ESRD - cont MWF schedule -hyponatremia - in part due to elevated glucose but needs more volume down. K 3.4 today - low because likely ran on 2 K bath yesterday - start on 3 K bath Wed.- may have 3. Anemia - hgb 9.8 - steady trend down - start 60 Aranesp Wed  4. Secondary hyperparathyroidism - has been on added Ca bath for Ca support but correct Ca may not warrant 3.5 Ca bath now 5. HTN/volume - net UF not recorded for Monday -  6. Nutrition -alb low renal carb mod diet 7. + trop - demand ischemia per cards 8, DM with hyperglycemia - per primary 9. PAD/CAD s/p CABG  Myriam Jacobson, PA-C [place] [phone] 11/07/2018,9:09 AM  LOS: 4 days   Subjective:   No prob with HD yesterday. Eating better today than yesterday. Objective Vitals:   11/06/18 1030 11/06/18 1100 11/06/18 1130 11/07/18 0518  BP: (!) 127/54 (!) 166/68 (!) 172/69 133/75  Pulse: 93 98 97 97  Resp:      Temp:    98.1 F (36.7 C)  TempSrc:    Oral  SpO2:    99%  Weight:    80.3 kg  Height:       Physical Exam General: NAD in bed Heart: RRR Lungs: grossly clear Extremities: left BKA no edema, R TMA wrapped - no LE edema Dialysis Access: left upper AVF + bruit   Additional Objective Labs: Basic Metabolic Panel: Recent Labs  Lab 11/03/18 1058 11/04/18 0252 11/06/18 0410 11/07/18 0319  NA 124* 128* 122* 125*  K 3.8 3.7 4.1 3.4*  CL 89* 89* 86* 88*  CO2 16* 22 20* 24  GLUCOSE 362* 447* 248* 299*  BUN 81* 39* 78* 39*  CREATININE 18.45* 10.85* 14.16* 7.87*  CALCIUM 7.7* 8.7* 7.7* 8.9   PHOS 4.0  --   --   --    Liver Function Tests: Recent Labs  Lab 11/03/18 1058  AST 22  ALT 11  ALKPHOS 72  BILITOT 0.6  PROT 7.7  ALBUMIN 2.7*   No results for input(s): LIPASE, AMYLASE in the last 168 hours. CBC: Recent Labs  Lab 11/03/18 0848 11/04/18 0252 11/05/18 0342 11/06/18 0410 11/07/18 0319  WBC 18.4* 21.0* 17.3* 15.8* 17.9*  NEUTROABS 15.8*  --   --   --   --   HGB 12.1* 11.2* 10.8* 10.1* 9.8*  HCT 35.0* 34.5* 33.6* 31.2* 30.9*  MCV 77.1* 78.6* 79.1* 79.8* 80.3  PLT 426* 390 387 376 377   Blood Culture    Component Value Date/Time   SDES BLOOD RIGHT ARM 11/04/2018 1007   SPECREQUEST  11/04/2018 1007    BOTTLES DRAWN AEROBIC ONLY Blood Culture adequate volume   CULT  11/04/2018 1007    NO GROWTH 2 DAYS Performed at [facility], [place] [address] [place], [place] [address]    REPTSTATUS PENDING 11/04/2018 1007    Cardiac Enzymes: Recent Labs  Lab 11/03/18 1412 11/03/18 2145 11/04/18 0252  TROPONINI 1.46* 1.66* 1.32*   CBG: Recent Labs  Lab 11/05/18 2206 11/06/18 0825 11/06/18 1702 11/06/18 2143 11/07/18 0752  GLUCAP 264* 196* 310* 251* 277*   Iron Studies: No results for input(s): IRON, TIBC, TRANSFERRIN, FERRITIN in the last 72 hours. Lab Results  Component Value Date   INR 1.25 11/04/2017   INR 1.00 10/30/2017   INR 1.05 09/13/2014   Studies/Results: No results found. Medications: . vancomycin 1,000 mg (11/06/18 1321)   . aspirin EC  81 mg Oral Daily  . atorvastatin  80 mg Oral q1800  . calcitRIOL  1.25 mcg Oral Q M,W,F-HD  . carvedilol  6.25 mg Oral BID  . Chlorhexidine Gluconate Cloth  6 each Topical Q0600  . clopidogrel  75 mg Oral Daily  . feeding supplement (PRO-STAT SUGAR FREE 64)  30 mL Oral BID  . heparin injection (subcutaneous)  5,000 Units Subcutaneous Q8H  . insulin aspart  0-9 Units Subcutaneous TID WC  . insulin glargine  8 Units Subcutaneous Daily  . isosorbide mononitrate  30 mg Oral Daily  .  lanthanum  1,000 mg Oral TID WC  . multivitamin  1 tablet Oral QHS  . vancomycin variable dose per unstable renal function (pharmacist dosing)   Does not apply See admin instructions      I have seen and examined this patient and agree with plan and assessment in the above note with renal recommendations/intervention highlighted.  Pt currently refusing further amputation and likely discharge to home per VVS.  Continue with HD qMWF. Broadus John A Emelee Rodocker,MD 11/07/2018 12:14 PM

## 2018-11-07 NOTE — Care Management (Signed)
Patient suffers from left BKA and right transmetatarsal amputation which impairs their ability to perform daily activities like bathing, dressing, grooming and toileting in the home. A walker will not resolve  issue with performing activities of daily living. A wheelchair will allow patient to safely perform daily activities. Patient is not able to propel themselves in the home using a standard weight wheelchair due to general weakness. Patient can self propel in the lightweight wheelchair.    Requesting a lightweight wheelchair with anti-tippers, amputee pad, seat and back cushions and left amputee legrest and right elevating leg rest Accessories: elevating leg rests (ELRs), wheel locks, extensions and anti-tippers.

## 2018-11-07 NOTE — TOC Initial Note (Addendum)
Transition of Care (TOC) - Initial/Assessment Note    Patient Details  Name: [name] Farney Sr. MRN: [id number] Date of Birth: 1976/01/10  Transition of Care [facility]) CM/SW Contact:    Midge Minium RN, BSN, NCM-BC, ACM-RN [phone] Phone Number: 11/07/2018, 12:14 PM  Clinical Narrative:  43 yo male presented with fever and chest pain. PMH: ESRD on HD, PAD with L BKA and R TMA, DM1, CAD (prior CABG). CM met with patient to discuss dispositional needs. Patient stated he lives at home alone with his significant other to stay with him post transition to assist with his care. Patient reported having a PCA with [facility] 5 day/wk for 5 hrs/day to assist with household tasks (cooking, cleaning). PT eval complete with SNF vs HH/proper wheelchair with 24hr assist recommended. Patient is agreeable to [facility] services with assistance provided by his significant other and PCA services. CMS HH compare list provided with [facility] selected. [place] referral given to [place], [facility]; AVS updated. Wheelchair request discussed with Leroy Sea, [facility]; with an order needed to coordinate a new wheelchair with POC discussed with Dr. Laural Golden. The DME company will deliver the wheelchair at patients residence; floor nurse made aware. Patients friend will provide transportation home. CM team will continue to follow.         Expected Discharge Plan: [place] Barriers to Discharge: No Barriers Identified   Patient Goals and CMS Choice Patient states their goals for this hospitalization and ongoing recovery are:: "I want the pain in my foot to stop" CMS Medicare.gov Compare Post Acute Care list provided to:: Patient Choice offered to / list presented to : Patient  Expected Discharge Plan and Services Expected Discharge Plan: [place] In-house Referral: NA Discharge Planning Services: CM Consult Post Acute Care Choice: Home Health, Durable Medical Equipment Living arrangements for  the past 2 months: Apartment Expected Discharge Date: 11/07/18               DME Arranged: Wheelchair manual DME Agency: [facility] HH Arranged: RN, Disease Management, PT [place] Agency: [place] ([facility])  Prior Living Arrangements/Services Living arrangements for the past 2 months: Apartment Lives with:: Self, Significant Other Patient language and need for interpreter reviewed:: No Do you feel safe going back to the place where you live?: Yes      Need for Family Participation in Patient Care: Yes (Comment) Care giver support system in place?: Yes (comment) Current home services: DME, Other (comment)(Medicaid Personal care aide) Criminal Activity/Legal Involvement Pertinent to Current Situation/Hospitalization: No - Comment as needed  Activities of Daily Living      Permission Sought/Granted Permission sought to share information with : Case Manager, Customer service manager Permission granted to share information with : Yes, Verbal Permission Granted     Permission granted to share info w AGENCY: [facility]        Emotional Assessment Appearance:: Appears older than stated age Attitude/Demeanor/Rapport: Gracious, Engaged Affect (typically observed): Accepting, Appropriate, Calm, Stable, Pleasant Orientation: : Oriented to Self, Oriented to Place, Oriented to  Time, Oriented to Situation Alcohol / Substance Use: Not Applicable Psych Involvement: No (comment)  Admission diagnosis:  Wound infection [T14.8XXA, L08.9] NSTEMI (non-ST elevated myocardial infarction) ([place]) [I21.4] Stage 5 chronic kidney disease on chronic dialysis ([place]) [N18.6, Z99.2] Patient Active Problem List   Diagnosis Date Noted  . MRSA bacteremia 11/04/2018  . Infection of amputation stump, right lower extremity ([place]) 11/04/2018  . Non-healing surgical wound 09/14/2018  .  Chest pain, rule out acute myocardial infarction 09/07/2018  . Gangrene of toe of left foot ([place]) 08/05/2018  .  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[place]) 08/05/2018  . Gangrene of foot ([place]) 08/05/2018  . 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 ACS (acute coronary syndrome) ([place]) 10/28/2017  . Flash pulmonary edema ([place]) 06/04/2017  . Hypertensive emergency 06/04/2017  . 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 S/P BKA (below knee amputation) unilateral, left ([place])   . Post-operative pain   . Acute blood loss anemia   . Chronic combined systolic and diastolic CHF (congestive heart failure) ([place])   . PVD (peripheral vascular disease) ([place])   . Coronary artery disease involving native coronary artery of native heart with unstable angina pectoris ([place])   . Tobacco abuse   . Diabetic foot infection ([place]) 03/08/2017  . Osteomyelitis ([place]) 03/08/2017  . Wound dehiscence   . Dehiscence of amputation stump ([place]) 01/04/2017  . Gangrene of right foot ([place]) 08/02/2016  . Achilles tendon contracture, left 08/02/2016  . Anemia of renal disease 08/16/2015  . Wound infection 08/15/2015  . Diabetic ulcer of right great toe ([place]) 08/15/2015  . Pain in the chest   . Elevated troponin   . Essential hypertension   . ESRD on dialysis ([place]) 08/07/2013  . NSTEMI (non-ST elevated myocardial infarction) ([place]) 05/16/2012  . Murmur 05/16/2012  . Renal disorder    PCP:  Sandi Mariscal, MD Pharmacy:   [facility], [place] - [address] [place] [address] Phone: [phone] Fax: [phone]     Social Determinants of Health (SDOH) Interventions    Readmission Risk Interventions Readmission Risk Prevention Plan 11/07/2018  Transportation Screening Complete  Medication Review ([place]) Complete  PCP or Specialist appointment within 3-5 days of discharge Not Complete  PCP/Specialist Appt Not Complete comments Specialist  appointment scheduled on 12/12/2018 by the physician  [place] or Home Care Consult Complete  SW Recovery Care/Counseling Consult Complete  Palliative Care Screening Not [place] Complete  Some recent data might be hidden

## 2018-11-07 NOTE — Plan of Care (Signed)
  Problem: Pain Managment: Goal: General experience of comfort will improve Outcome: Progressing Note:  Pain controlled on oral pain meds.

## 2018-11-07 NOTE — Progress Notes (Addendum)
  Progress Note    11/07/2018 10:33 AM  Subjective:  No new complaints.  Anticipating d/c home today   Vitals:   11/06/18 1130 11/07/18 0518  BP: (!) 172/69 133/75  Pulse: 97 97  Resp:    Temp:  98.1 F (36.7 C)  SpO2:  99%   Physical Exam: Lungs:  Non labored Incisions:  R TMA lateral aspect open Abdomen:  soft Neurologic: A&O  CBC    Component Value Date/Time   WBC 17.9 (H) 11/07/2018 0319   RBC 3.85 (L) 11/07/2018 0319   HGB 9.8 (L) 11/07/2018 0319   HGB 14.3 02/07/2018 0937   HCT 30.9 (L) 11/07/2018 0319   HCT 42.8 02/07/2018 0937   PLT 377 11/07/2018 0319   PLT 369 02/07/2018 0937   MCV 80.3 11/07/2018 0319   MCV 82 02/07/2018 0937   MCH 25.5 (L) 11/07/2018 0319   MCHC 31.7 11/07/2018 0319   RDW 17.6 (H) 11/07/2018 0319   RDW 21.5 (H) 02/07/2018 0937   LYMPHSABS 1.3 11/03/2018 0848   MONOABS 1.0 11/03/2018 0848   EOSABS 0.1 11/03/2018 0848   BASOSABS 0.0 11/03/2018 0848    BMET    Component Value Date/Time   NA 125 (L) 11/07/2018 0319   NA 133 (L) 02/07/2018 0937   K 3.4 (L) 11/07/2018 0319   CL 88 (L) 11/07/2018 0319   CO2 24 11/07/2018 0319   GLUCOSE 299 (H) 11/07/2018 0319   GLUCOSE 262 03/06/2008   BUN 39 (H) 11/07/2018 0319   BUN 37 (H) 02/07/2018 0937   CREATININE 7.87 (H) 11/07/2018 0319   CALCIUM 8.9 11/07/2018 0319   GFRNONAA 8 (L) 11/07/2018 0319   GFRAA 9 (L) 11/07/2018 0319    INR    Component Value Date/Time   INR 1.25 11/04/2017 1450     Intake/Output Summary (Last 24 hours) at 11/07/2018 1033 Last data filed at 11/06/2018 2330 Gross per 24 hour  Intake 980 ml  Output -  Net 980 ml     Assessment/Plan:  43 y.o. male is s/p RLE revascularization and TMA for CLI with tissue loss  Dressing changed R TMA Offered a BKA however patient prefers wound care at this time [facility] for discharge from vascular standpoint Continue packing lateral TMA daily Follow up in office for wound check in 1 week Plans noted to continue IV  antibiotics during HD   Dagoberto Ligas, PA-C [facility] [phone] 11/07/2018 10:33 AM  Patient refusing amputation currently.  He will try dressing changes and ABx at home.     Annamarie Major

## 2018-11-07 NOTE — Progress Notes (Signed)
Physical Therapy Treatment Patient Details Name: [name] Sprong Sr. MRN: [id number] DOB: 1976/07/01 Today's Date: 11/07/2018    History of Present Illness Pt is a 43 y.o. 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 R foot imaging shows subcutaneous emphysema without any bony changes. Blood cultures with MRSA bactermeia. Plan for TTE. 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    PT Comments    Pt admitted with above diagnosis. Pt currently with functional limitations due to balance and endurance deficits. Pt was unable to tolerate sitting EOB for more than 20 seconds due to pain in right LE.  Unable to transfer and did LE exercises only.  Also, noted some issues with pts wheelchair.  The armrests are not able to be removed.  Also pt needs a left amputee legrest and a right elevating legrest.  He tthinks the wheelchair is a rental.  Will ask CM to call [facility] to see about getting pt the proper equipment because he needs the above to go to dialysis.  May need SNF as he cannot transfer currently as he cant put foot on floor.  Pt will benefit from skilled PT to increase their independence and safety with mobility to allow discharge to the venue listed below.     Follow Up Recommendations  SNF;Supervision/Assistance - 24 hour     Equipment Recommendations  None recommended by PT    Recommendations for Other Services       Precautions / Restrictions Precautions Precautions: Fall Precaution Comments: H/o L BKA, R transmet amputation; does not have LLE prosthetic in room Restrictions Weight Bearing Restrictions: No    Mobility  Bed Mobility Overal bed mobility: Modified Independent             General bed mobility comments: Encouragement for BLE mobility, sitting EOB and to maintain strength.  Pt was unable to sit EOB over 20 seconds at a time due to  right LE pain.  Attempted x 3 and pt could not tolerate therefore pt laid back down.    Transfers                 General transfer comment: Set up wheelchair but pt unable to get to the chair due to right LE pain.   Ambulation/Gait                 Stairs             Wheelchair Mobility    Modified Rankin (Stroke Patients Only)       Balance                                            Cognition Arousal/Alertness: Awake/alert Behavior During Therapy: Flat affect Overall Cognitive Status: Within Functional Limits for tasks assessed                                        Exercises General Exercises - Lower Extremity Ankle Circles/Pumps: AROM;Both;10 reps;Supine Quad Sets: AROM;10 reps;Both;Supine Heel Slides: AROM;Both;10 reps;Supine Other Exercises Other Exercises: Encouraged ROM in all BLE joints, pt reports familiar with BLE exercises from past PT experiences  General Comments General comments (skin integrity, edema, etc.): VSS      Pertinent Vitals/Pain Pain Assessment: Faces Faces Pain Scale: Hurts even more Pain Location: R foot Pain Descriptors / Indicators: Constant;Sharp;Grimacing Pain Intervention(s): Limited activity within patient's tolerance;Monitored during session;Repositioned    Home Living                      Prior Function            PT Goals (current goals can now be found in the care plan section) Acute Rehab PT Goals Patient Stated Goal: I want the pain in my foot to finally stop Progress towards PT goals: Not progressing toward goals - comment(right LE pain preventing pt from mobilizing)    Frequency    Min 3X/week      PT Plan Discharge plan needs to be updated    Co-evaluation              AM-PAC PT "6 Clicks" Mobility   Outcome Measure  Help needed turning from your back to your side while in a flat bed without using bedrails?: None Help needed moving  from lying on your back to sitting on the side of a flat bed without using bedrails?: None Help needed moving to and from a bed to a chair (including a wheelchair)?: A Little Help needed standing up from a chair using your arms (e.g., wheelchair or bedside chair)?: A Little Help needed to walk in hospital room?: A Little Help needed climbing 3-5 steps with a railing? : A Little 6 Click Score: 20    End of Session   Activity Tolerance: Patient limited by pain Patient left: in bed;with call bell/phone within reach Nurse Communication: Mobility status PT Visit Diagnosis: Other abnormalities of gait and mobility (R26.89);Pain Pain - Right/Left: Right Pain - part of body: Ankle and joints of foot     Time: 0852-0904 PT Time Calculation (min) (ACUTE ONLY): 12 min  Charges:  $Therapeutic Exercise: 8-22 mins                     [place] Pager:  [phone]  Office:  [place] 11/07/2018, 9:58 AM

## 2018-11-07 NOTE — Progress Notes (Addendum)
[facility] for Infectious Disease  Date of Admission:  11/03/2018     Total days of antibiotics 4 Vancomycin    ASSESSMENT: [name] is now at day 4 of treatment for his MRSA bacteremia.  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  He has given further amputation thought and would like to proceed with prolonged IV antibiotic treatment as well as medical management for now.  He has remained afebrile.  Blood cultures cleared 24 hours and on the 21st.  We will arrange for follow-up around the 4-week mark to determine how he is doing with plan to either stop or extend to 6 weeks at that time.  We reviewed other tactics for him to be successful in this therapy.  I have recommended that he not resume cigarette smoking or nicotine replacement, he agrees.  Emphasized how important it is to make all of his dialysis sessions as his antibiotics are dependent on this as well.  Tight glycemic control and offloading of his foot with attentive wound care.     PLAN: 1. Plan for Vancomycin 6 weeks through May 2nd after HD.  2. Follow up arranged for April 28th @ 2:45 pm with Janene Madeira, NP   Principal Problem:   MRSA bacteremia Active Problems:   NSTEMI (non-ST elevated myocardial infarction) ([place])   ESRD on dialysis [facility])   Essential hypertension   Chronic combined systolic and diastolic CHF (congestive heart failure) (H[facility])   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[place])   Infection of amputation stump, right lower extremity ([place])   . aspirin EC  81 mg Oral Daily  . atorvastatin  80 mg Oral q1800  . calcitRIOL  1.25 mcg Oral Q M,W,F-HD  . carvedilol  6.25 mg Oral BID  . Chlorhexidine Gluconate Cloth  6 each Topical Q0600  . clopidogrel  75 mg Oral Daily  . [START ON 11/08/2018] darbepoetin (ARANESP) injection - DIALYSIS  60 mcg Intravenous Q Wed-HD  . feeding supplement (PRO-STAT SUGAR FREE 64)  30 mL Oral BID  . heparin  injection (subcutaneous)  5,000 Units Subcutaneous Q8H  . insulin aspart  0-9 Units Subcutaneous TID WC  . insulin glargine  8 Units Subcutaneous Daily  . isosorbide mononitrate  30 mg Oral Daily  . lanthanum  1,000 mg Oral TID WC  . multivitamin  1 tablet Oral QHS  . vancomycin variable dose per unstable renal function (pharmacist dosing)   Does not apply See admin instructions    SUBJECTIVE: [name] is starting to feel little bit better this morning.  He still has intermittent throbbing pain that make it difficult to bear weight on the right foot, however he does note some improvement from prior to admission.  He has had no further fevers or chills.  His wound was just packed about an hour ago.  He does smoke cigarettes but has not had any for 13 days now and plans to continue to abstain at discharge.  He is diabetic and reports that his blood sugars have been "a little high lately."  He does have a offloading orthopedic shoe at home but does not wear it due to the weight.  He says he makes most dialysis sessions during the week but if he misses it is related to the inability to bear weight on his foot.  Review of Systems: Review of Systems  Constitutional: Negative for chills, fever and malaise/fatigue.  Respiratory: Negative for cough.   Cardiovascular: Negative for chest pain and leg swelling.  Gastrointestinal: Negative for abdominal pain, diarrhea and vomiting.  Musculoskeletal: Positive for joint pain (R foot ).  Skin: Negative for itching and rash.  Neurological: Negative for headaches.    Allergies  Allergen Reactions  . Coconut Oil Anaphylaxis and Other (See Comments)    Can use topically, allergic to coconut foods     OBJECTIVE: Vitals:   11/06/18 1030 11/06/18 1100 11/06/18 1130 11/07/18 0518  BP: (!) 127/54 (!) 166/68 (!) 172/69 133/75  Pulse: 93 98 97 97  Resp:      Temp:    98.1 F (36.7 C)  TempSrc:    Oral  SpO2:    99%  Weight:    80.3 kg  Height:        Body mass index is 22.73 kg/m.  Physical Exam Constitutional:      Appearance: Normal appearance.     Comments: Resting quietly in bed watching TV.   HENT:     Mouth/Throat:     Mouth: Mucous membranes are moist.     Pharynx: No oropharyngeal exudate.  Cardiovascular:     Rate and Rhythm: Normal rate.     Heart sounds: No murmur.     Comments: R foot warm/dry, no easily palpable pulses  Pulmonary:     Effort: Pulmonary effort is normal.     Breath sounds: Normal breath sounds.  Abdominal:     General: Bowel sounds are normal.     Tenderness: There is no abdominal tenderness.  Musculoskeletal: Normal range of motion.        General: No swelling.  Skin:    General: Skin is warm and dry.  Neurological:     Mental Status: He is alert and oriented to person, place, and time.     Lab Results Lab Results  Component Value Date   WBC 17.9 (H) 11/07/2018   HGB 9.8 (L) 11/07/2018   HCT 30.9 (L) 11/07/2018   MCV 80.3 11/07/2018   PLT 377 11/07/2018    Lab Results  Component Value Date   CREATININE 7.87 (H) 11/07/2018   BUN 39 (H) 11/07/2018   NA 125 (L) 11/07/2018   K 3.4 (L) 11/07/2018   CL 88 (L) 11/07/2018   CO2 24 11/07/2018    Lab Results  Component Value Date   ALT 11 11/03/2018   AST 22 11/03/2018   ALKPHOS 72 11/03/2018   BILITOT 0.6 11/03/2018     Microbiology: Recent Results (from the past 240 hour(s))  Blood Culture (routine x 2)     Status: Abnormal   Collection Time: 11/03/18  8:54 AM  Result Value Ref Range Status   Specimen Description BLOOD RIGHT HAND  Final   Special Requests   Final    BOTTLES DRAWN AEROBIC AND ANAEROBIC Blood Culture adequate volume   Culture  Setup Time   Final    GRAM POSITIVE COCCI IN BOTH AEROBIC AND ANAEROBIC BOTTLES CRITICAL RESULT CALLED TO, READ BACK BY AND VERIFIED WITH: C AMEND PHARMD 11/03/18 2349 JDW Performed at [facility], [place] [address] [place], [place] [address]    Culture METHICILLIN RESISTANT  STAPHYLOCOCCUS AUREUS (A)  Final   Report Status 11/06/2018 FINAL  Final   Organism ID, Bacteria METHICILLIN RESISTANT STAPHYLOCOCCUS AUREUS  Final      Susceptibility   Methicillin resistant staphylococcus aureus - MIC*    CIPROFLOXACIN >=8 RESISTANT Resistant  ERYTHROMYCIN >=8 RESISTANT Resistant     GENTAMICIN <=0.5 SENSITIVE Sensitive     OXACILLIN >=4 RESISTANT Resistant     TETRACYCLINE <=1 SENSITIVE Sensitive     VANCOMYCIN 1 SENSITIVE Sensitive     TRIMETH/SULFA <=10 SENSITIVE Sensitive     CLINDAMYCIN <=0.25 SENSITIVE Sensitive     RIFAMPIN <=0.5 SENSITIVE Sensitive     Inducible Clindamycin NEGATIVE Sensitive     * METHICILLIN RESISTANT STAPHYLOCOCCUS AUREUS  Blood Culture ID Panel (Reflexed)     Status: Abnormal   Collection Time: 11/03/18  8:54 AM  Result Value Ref Range Status   Enterococcus species NOT DETECTED NOT DETECTED Final   Listeria monocytogenes NOT DETECTED NOT DETECTED Final   Staphylococcus species DETECTED (A) NOT DETECTED Final    Comment: CRITICAL RESULT CALLED TO, READ BACK BY AND VERIFIED WITH: C AMEND PHARMD 11/03/18 2349 JDW    Staphylococcus aureus (BCID) DETECTED (A) NOT DETECTED Final    Comment: Methicillin (oxacillin)-resistant Staphylococcus aureus (MRSA). MRSA is predictably resistant to beta-lactam antibiotics (except ceftaroline). Preferred therapy is vancomycin unless clinically contraindicated. Patient requires contact precautions if  hospitalized. CRITICAL RESULT CALLED TO, READ BACK BY AND VERIFIED WITH: C AMEND PHARMD 11/03/18 2349 JDW    Methicillin resistance DETECTED (A) NOT DETECTED Final    Comment: CRITICAL RESULT CALLED TO, READ BACK BY AND VERIFIED WITH: C AMEND PHARMD 11/03/18 2349 JDW    Streptococcus species NOT DETECTED NOT DETECTED Final   Streptococcus agalactiae NOT DETECTED NOT DETECTED Final   Streptococcus pneumoniae NOT DETECTED NOT DETECTED Final   Streptococcus pyogenes NOT DETECTED NOT DETECTED Final    Acinetobacter baumannii NOT DETECTED NOT DETECTED Final   Enterobacteriaceae species NOT DETECTED NOT DETECTED Final   Enterobacter cloacae complex NOT DETECTED NOT DETECTED Final   Escherichia coli NOT DETECTED NOT DETECTED Final   Klebsiella oxytoca NOT DETECTED NOT DETECTED Final   Klebsiella pneumoniae NOT DETECTED NOT DETECTED Final   Proteus species NOT DETECTED NOT DETECTED Final   Serratia marcescens NOT DETECTED NOT DETECTED Final   Haemophilus influenzae NOT DETECTED NOT DETECTED Final   Neisseria meningitidis NOT DETECTED NOT DETECTED Final   Pseudomonas aeruginosa NOT DETECTED NOT DETECTED Final   Candida albicans NOT DETECTED NOT DETECTED Final   Candida glabrata NOT DETECTED NOT DETECTED Final   Candida krusei NOT DETECTED NOT DETECTED Final   Candida parapsilosis NOT DETECTED NOT DETECTED Final   Candida tropicalis NOT DETECTED NOT DETECTED Final    Comment: Performed at [facility], [place]. [address], [place], [place] [address]  Blood Culture (routine x 2)     Status: None (Preliminary result)   Collection Time: 11/03/18  9:42 PM  Result Value Ref Range Status   Specimen Description BLOOD RIGHT HAND  Final   Special Requests   Final    BOTTLES DRAWN AEROBIC ONLY Blood Culture results may not be optimal due to an inadequate volume of blood received in culture bottles   Culture   Final    NO GROWTH 3 DAYS Performed at [facility], [place] [address] [place], [place] [address]    Report Status PENDING  Incomplete  Culture, blood (Routine X 2) w Reflex to ID Panel     Status: None (Preliminary result)   Collection Time: 11/04/18  2:50 AM  Result Value Ref Range Status   Specimen Description BLOOD RIGHT FOREARM  Final   Special Requests   Final    BOTTLES DRAWN AEROBIC AND ANAEROBIC Blood  Culture adequate volume   Culture   Final    NO GROWTH 2 DAYS Performed at [facility], [place] [address] [place], [place] [address]    Report Status PENDING  Incomplete   Culture, blood (Routine X 2) w Reflex to ID Panel     Status: None (Preliminary result)   Collection Time: 11/04/18 10:07 AM  Result Value Ref Range Status   Specimen Description BLOOD RIGHT ARM  Final   Special Requests   Final    BOTTLES DRAWN AEROBIC ONLY Blood Culture adequate volume   Culture   Final    NO GROWTH 2 DAYS Performed at [facility], [place] [address] [place], [place] [address]    Report Status PENDING  Incomplete    Janene Madeira, MSN, NP-C [facility] [place] Cell: [phone] Pager: [phone]  11/07/2018  9:58 AM

## 2018-11-08 LAB — GLUCOSE, CAPILLARY: Glucose-Capillary: 154 mg/dL — ABNORMAL HIGH (ref 70–99)

## 2018-11-09 LAB — CULTURE, BLOOD (ROUTINE X 2)
CULTURE: NO GROWTH
Culture: NO GROWTH
Special Requests: ADEQUATE
Special Requests: ADEQUATE

## 2018-11-11 LAB — CULTURE, BLOOD (ROUTINE X 2)

## 2018-11-16 ENCOUNTER — Telehealth (HOSPITAL_COMMUNITY): Payer: Self-pay | Admitting: Rehabilitation

## 2018-11-16 NOTE — Telephone Encounter (Signed)
Called patient about virtual office visit. Patient had another call on the phone and needed me to call him back in 20 mins. Patient does have a smart phone. Sent instructions through [employer] about Web Ex. Will see what patient says when I call him back.  Called patient back. Patient agreed to have virtual visit on Tuesday 4/7.      Virtual Visit Pre-Appointment Phone Call  Steps For Call:  1. Confirm consent - "In the setting of the current Covid19 crisis, you are scheduled for a (phone or video) visit with your provider on (date) at (time).  Just as we do with many in-office visits, in order for you to participate in this visit, we must obtain consent.  If you'd like, I can send this to your mychart (if signed up) or email for you to review.  Otherwise, I can obtain your verbal consent now.  All virtual visits are billed to your insurance company just like a normal visit would be.  By agreeing to a virtual visit, we'd like you to understand that the technology does not allow for your provider to perform an examination, and thus may limit your provider's ability to fully assess your condition.  Finally, though the technology is pretty good, we cannot assure that it will always work on either your or our end, and in the setting of a video visit, we may have to convert it to a phone-only visit.  In either situation, we cannot ensure that we have a secure connection.  Are you willing to proceed?"  2. Give patient instructions for WebEx download to smartphone as below if video visit  3. Advise patient to be prepared with any vital sign or heart rhythm information, their current medicines, and a piece of paper and pen handy for any instructions they may receive the day of their visit  4. Inform patient they will receive a phone call 15 minutes prior to their appointment time (may be from unknown caller ID) so they should be prepared to answer  5. Confirm that appointment type is correct in Epic  appointment notes (video vs telephone)    TELEPHONE CALL NOTE  Irven Ingalsbe Sr. has been deemed a candidate for a follow-up tele-health visit to limit community exposure during the Covid-19 pandemic. I spoke with the patient via phone to ensure availability of phone/video source, confirm preferred email & phone number, and discuss instructions and expectations.  I reminded Edvin Albus Sr. to be prepared with any vital sign and/or heart rhythm information that could potentially be obtained via home monitoring, at the time of his visit. I reminded Rainen Vanrossum Sr. to expect a phone call at the time of his visit if his visit.  Did the patient verbally acknowledge consent to treatment? Yes  Michaelyn Barter, RN 11/16/2018 3:57 PM   DOWNLOADING THE [place], go to [employer] and type in WebEx in the search bar. [employer] [employer], the blue/green circle. The app is free but as with any other app downloads, their phone may require them to verify saved payment information or Apple password. The patient does NOT have to create an account.  - If Android, ask patient to go to [employer] and type in WebEx in the search bar. [place] [employer], the blue/green circle. The app is free but as with any other app downloads, their phone may require them to verify saved payment information or  Android password. The patient does NOT have to create an account.   CONSENT FOR TELE-HEALTH VISIT - PLEASE REVIEW  I hereby voluntarily request, consent and authorize [facility] and its employed or contracted physicians, physician assistants, nurse practitioners or other licensed health care professionals (the Practitioner), to provide me with telemedicine health care services (the "Services") as deemed necessary by the treating Practitioner. I acknowledge and consent to receive the Services by the Practitioner via telemedicine. I  understand that the telemedicine visit will involve communicating with the Practitioner through live audiovisual communication technology and the disclosure of certain medical information by electronic transmission. I acknowledge that I have been given the opportunity to request an in-person assessment or other available alternative prior to the telemedicine visit and am voluntarily participating in the telemedicine visit.  I understand that I have the right to withhold or withdraw my consent to the use of telemedicine in the course of my care at any time, without affecting my right to future care or treatment, and that the Practitioner or I may terminate the telemedicine visit at any time. I understand that I have the right to inspect all information obtained and/or recorded in the course of the telemedicine visit and may receive copies of available information for a reasonable fee.  I understand that some of the potential risks of receiving the Services via telemedicine include:  Marland Kitchen Delay or interruption in medical evaluation due to technological equipment failure or disruption; . Information transmitted may not be sufficient (e.g. poor resolution of images) to allow for appropriate medical decision making by the Practitioner; and/or  . In rare instances, security protocols could fail, causing a breach of personal health information.  Furthermore, I acknowledge that it is my responsibility to provide information about my medical history, conditions and care that is complete and accurate to the best of my ability. I acknowledge that Practitioner's advice, recommendations, and/or decision may be based on factors not within their control, such as incomplete or inaccurate data provided by me or distortions of diagnostic images or specimens that may result from electronic transmissions. I understand that the practice of medicine is not an exact science and that Practitioner makes no warranties or guarantees  regarding treatment outcomes. I acknowledge that I will receive a copy of this consent concurrently upon execution via email to the email address I last provided but may also request a printed copy by calling the office of [facility].    I understand that my insurance will be billed for this visit.   I have read or had this consent read to me. . I understand the contents of this consent, which adequately explains the benefits and risks of the Services being provided via telemedicine.  . I have been provided ample opportunity to ask questions regarding this consent and the Services and have had my questions answered to my satisfaction. . I give my informed consent for the services to be provided through the use of telemedicine in my medical care  By participating in this telemedicine visit I agree to the above.

## 2018-11-16 NOTE — Telephone Encounter (Signed)
The above patient or their representative was contacted and gave the following answers to these questions:         Do you have any of the following symptoms? No  Fever                    Cough                   Shortness of breath  Do  you have any of the following other symptoms? No   muscle pain         vomiting,        diarrhea        rash         weakness        red eye        abdominal pain         bruising          bruising or bleeding              joint pain           severe headache    Have you been in contact with someone who was or has been sick in the past 2 weeks? No  Yes                 Unsure                         Unable to assess   Does the person that you were in contact with have any of the following symptoms? No  Cough         shortness of breath           muscle pain         vomiting,            diarrhea            rash            weakness           fever            red eye           abdominal pain           bruising  or  bleeding                joint pain                severe headache               Have you  or someone you have been in contact with traveled internationally in th last month?  No       If yes, which countries?   Have you  or someone you have been in contact with traveled outside [place] in th last month?  No       If yes, which state and city?   COMMENTS OR ACTION PLAN FOR THIS PATIENT:          

## 2018-11-17 ENCOUNTER — Encounter: Payer: Self-pay | Admitting: Vascular Surgery

## 2018-11-17 ENCOUNTER — Inpatient Hospital Stay (HOSPITAL_COMMUNITY)
Admission: RE | Admit: 2018-11-17 | Discharge: 2018-11-23 | DRG: 856 | Disposition: A | Discharge status: Rehabilitation Facility | Payer: Medicaid Other | Source: Ambulatory Visit | Attending: Vascular Surgery | Admitting: Vascular Surgery

## 2018-11-17 ENCOUNTER — Encounter (HOSPITAL_COMMUNITY): Payer: Self-pay | Admitting: *Deleted

## 2018-11-17 ENCOUNTER — Encounter (HOSPITAL_COMMUNITY)
Admission: RE | Disposition: A | Discharge status: Rehabilitation Facility | Payer: Self-pay | Source: Ambulatory Visit | Attending: Vascular Surgery

## 2018-11-17 ENCOUNTER — Ambulatory Visit: Payer: Medicaid Other | Admitting: Family

## 2018-11-17 ENCOUNTER — Ambulatory Visit (INDEPENDENT_AMBULATORY_CARE_PROVIDER_SITE_OTHER): Payer: Self-pay | Admitting: Vascular Surgery

## 2018-11-17 ENCOUNTER — Ambulatory Visit (HOSPITAL_COMMUNITY): Payer: Medicaid Other | Admitting: Anesthesiology

## 2018-11-17 ENCOUNTER — Other Ambulatory Visit: Payer: Self-pay

## 2018-11-17 VITALS — BP 131/69 | HR 101 | Temp 98.2°F | Resp 20 | Ht 74.0 in | Wt 177.0 lb

## 2018-11-17 DIAGNOSIS — E1152 Type 2 diabetes mellitus with diabetic peripheral angiopathy with gangrene: Secondary | ICD-10-CM | POA: Diagnosis present

## 2018-11-17 DIAGNOSIS — I132 Hypertensive heart and chronic kidney disease with heart failure and with stage 5 chronic kidney disease, or end stage renal disease: Secondary | ICD-10-CM | POA: Diagnosis present

## 2018-11-17 DIAGNOSIS — E1142 Type 2 diabetes mellitus with diabetic polyneuropathy: Secondary | ICD-10-CM | POA: Diagnosis not present

## 2018-11-17 DIAGNOSIS — D631 Anemia in chronic kidney disease: Secondary | ICD-10-CM | POA: Diagnosis present

## 2018-11-17 DIAGNOSIS — Z79899 Other long term (current) drug therapy: Secondary | ICD-10-CM

## 2018-11-17 DIAGNOSIS — E876 Hypokalemia: Secondary | ICD-10-CM | POA: Diagnosis present

## 2018-11-17 DIAGNOSIS — T8149XA Infection following a procedure, other surgical site, initial encounter: Principal | ICD-10-CM | POA: Diagnosis present

## 2018-11-17 DIAGNOSIS — Z8249 Family history of ischemic heart disease and other diseases of the circulatory system: Secondary | ICD-10-CM

## 2018-11-17 DIAGNOSIS — E8889 Other specified metabolic disorders: Secondary | ICD-10-CM | POA: Diagnosis present

## 2018-11-17 DIAGNOSIS — D62 Acute posthemorrhagic anemia: Secondary | ICD-10-CM | POA: Diagnosis not present

## 2018-11-17 DIAGNOSIS — B9562 Methicillin resistant Staphylococcus aureus infection as the cause of diseases classified elsewhere: Secondary | ICD-10-CM | POA: Diagnosis present

## 2018-11-17 DIAGNOSIS — Z992 Dependence on renal dialysis: Secondary | ICD-10-CM | POA: Diagnosis not present

## 2018-11-17 DIAGNOSIS — I96 Gangrene, not elsewhere classified: Secondary | ICD-10-CM

## 2018-11-17 DIAGNOSIS — K219 Gastro-esophageal reflux disease without esophagitis: Secondary | ICD-10-CM | POA: Diagnosis present

## 2018-11-17 DIAGNOSIS — I2510 Atherosclerotic heart disease of native coronary artery without angina pectoris: Secondary | ICD-10-CM | POA: Diagnosis present

## 2018-11-17 DIAGNOSIS — R7881 Bacteremia: Secondary | ICD-10-CM | POA: Diagnosis not present

## 2018-11-17 DIAGNOSIS — D649 Anemia, unspecified: Secondary | ICD-10-CM | POA: Diagnosis not present

## 2018-11-17 DIAGNOSIS — F17210 Nicotine dependence, cigarettes, uncomplicated: Secondary | ICD-10-CM | POA: Diagnosis present

## 2018-11-17 DIAGNOSIS — I252 Old myocardial infarction: Secondary | ICD-10-CM

## 2018-11-17 DIAGNOSIS — N186 End stage renal disease: Secondary | ICD-10-CM | POA: Diagnosis present

## 2018-11-17 DIAGNOSIS — Z7982 Long term (current) use of aspirin: Secondary | ICD-10-CM | POA: Diagnosis not present

## 2018-11-17 DIAGNOSIS — Z951 Presence of aortocoronary bypass graft: Secondary | ICD-10-CM | POA: Diagnosis not present

## 2018-11-17 DIAGNOSIS — G8918 Other acute postprocedural pain: Secondary | ICD-10-CM | POA: Diagnosis not present

## 2018-11-17 DIAGNOSIS — F329 Major depressive disorder, single episode, unspecified: Secondary | ICD-10-CM | POA: Diagnosis present

## 2018-11-17 DIAGNOSIS — S88111A Complete traumatic amputation at level between knee and ankle, right lower leg, initial encounter: Secondary | ICD-10-CM | POA: Diagnosis not present

## 2018-11-17 DIAGNOSIS — Z89511 Acquired absence of right leg below knee: Secondary | ICD-10-CM | POA: Diagnosis not present

## 2018-11-17 DIAGNOSIS — S81801D Unspecified open wound, right lower leg, subsequent encounter: Secondary | ICD-10-CM

## 2018-11-17 DIAGNOSIS — Z89431 Acquired absence of right foot: Secondary | ICD-10-CM

## 2018-11-17 DIAGNOSIS — E1122 Type 2 diabetes mellitus with diabetic chronic kidney disease: Secondary | ICD-10-CM | POA: Diagnosis present

## 2018-11-17 DIAGNOSIS — Z89512 Acquired absence of left leg below knee: Secondary | ICD-10-CM | POA: Diagnosis not present

## 2018-11-17 DIAGNOSIS — T8781 Dehiscence of amputation stump: Secondary | ICD-10-CM | POA: Diagnosis not present

## 2018-11-17 DIAGNOSIS — E119 Type 2 diabetes mellitus without complications: Secondary | ICD-10-CM | POA: Diagnosis not present

## 2018-11-17 DIAGNOSIS — I5042 Chronic combined systolic (congestive) and diastolic (congestive) heart failure: Secondary | ICD-10-CM | POA: Diagnosis present

## 2018-11-17 DIAGNOSIS — Z4781 Encounter for orthopedic aftercare following surgical amputation: Secondary | ICD-10-CM | POA: Diagnosis not present

## 2018-11-17 DIAGNOSIS — Z794 Long term (current) use of insulin: Secondary | ICD-10-CM | POA: Diagnosis not present

## 2018-11-17 DIAGNOSIS — S81809A Unspecified open wound, unspecified lower leg, initial encounter: Secondary | ICD-10-CM | POA: Diagnosis present

## 2018-11-17 HISTORY — PX: AMPUTATION: SHX166

## 2018-11-17 LAB — CBC
HCT: 27.2 % — ABNORMAL LOW (ref 39.0–52.0)
Hemoglobin: 8.8 g/dL — ABNORMAL LOW (ref 13.0–17.0)
MCH: 25.4 pg — ABNORMAL LOW (ref 26.0–34.0)
MCHC: 32.4 g/dL (ref 30.0–36.0)
MCV: 78.6 fL — ABNORMAL LOW (ref 80.0–100.0)
Platelets: 699 10*3/uL — ABNORMAL HIGH (ref 150–400)
RBC: 3.46 MIL/uL — ABNORMAL LOW (ref 4.22–5.81)
RDW: 18.6 % — ABNORMAL HIGH (ref 11.5–15.5)
WBC: 44 10*3/uL — ABNORMAL HIGH (ref 4.0–10.5)
nRBC: 0 % (ref 0.0–0.2)

## 2018-11-17 LAB — GLUCOSE, CAPILLARY
Glucose-Capillary: 226 mg/dL — ABNORMAL HIGH (ref 70–99)
Glucose-Capillary: 233 mg/dL — ABNORMAL HIGH (ref 70–99)

## 2018-11-17 LAB — BASIC METABOLIC PANEL
Anion gap: 17 — ABNORMAL HIGH (ref 5–15)
BUN: 20 mg/dL (ref 6–20)
CO2: 21 mmol/L — ABNORMAL LOW (ref 22–32)
Calcium: 9.7 mg/dL (ref 8.9–10.3)
Chloride: 93 mmol/L — ABNORMAL LOW (ref 98–111)
Creatinine, Ser: 5.43 mg/dL — ABNORMAL HIGH (ref 0.61–1.24)
GFR calc Af Amer: 14 mL/min — ABNORMAL LOW (ref >60)
GFR calc non Af Amer: 12 mL/min — ABNORMAL LOW (ref >60)
Glucose, Bld: 182 mg/dL — ABNORMAL HIGH (ref 70–99)
Potassium: 3.3 mmol/L — ABNORMAL LOW (ref 3.5–5.1)
Sodium: 131 mmol/L — ABNORMAL LOW (ref 135–145)

## 2018-11-17 SURGERY — AMPUTATION, ABOVE KNEE
Anesthesia: General | Site: Leg Lower | Laterality: Right

## 2018-11-17 MED ORDER — SUCCINYLCHOLINE CHLORIDE 200 MG/10ML IV SOSY
PREFILLED_SYRINGE | INTRAVENOUS | Status: DC | PRN
  Administered 2018-11-17: 100 mg via INTRAVENOUS

## 2018-11-17 MED ORDER — VANCOMYCIN HCL IN DEXTROSE 750-5 MG/150ML-% IV SOLN
750.0000 mg | Freq: Once | INTRAVENOUS | Status: DC
  Filled 2018-11-17: qty 150

## 2018-11-17 MED ORDER — MORPHINE SULFATE (PF) 2 MG/ML IV SOLN
2.0000 mg | INTRAVENOUS | Status: DC | PRN
  Administered 2018-11-17 – 2018-11-20 (×13): 2 mg via INTRAVENOUS
  Filled 2018-11-17 (×12): qty 1

## 2018-11-17 MED ORDER — FENTANYL CITRATE (PF) 100 MCG/2ML IJ SOLN
INTRAMUSCULAR | Status: DC | PRN
  Administered 2018-11-17: 100 ug via INTRAVENOUS

## 2018-11-17 MED ORDER — CARVEDILOL 6.25 MG PO TABS
6.2500 mg | ORAL_TABLET | Freq: Two times a day (BID) | ORAL | Status: DC
  Administered 2018-11-17 – 2018-11-23 (×13): 6.25 mg via ORAL
  Filled 2018-11-17 (×12): qty 1

## 2018-11-17 MED ORDER — HYDRALAZINE HCL 20 MG/ML IJ SOLN: 5.0000 mg | INTRAMUSCULAR | Status: DC | PRN

## 2018-11-17 MED ORDER — ONDANSETRON HCL 4 MG PO TABS: 4.0000 mg | ORAL_TABLET | Freq: Four times a day (QID) | ORAL | Status: DC | PRN

## 2018-11-17 MED ORDER — VANCOMYCIN HCL 1000 MG IV SOLR
INTRAVENOUS | Status: DC | PRN
  Administered 2018-11-17: 1000 mg via INTRAVENOUS

## 2018-11-17 MED ORDER — MORPHINE SULFATE 15 MG PO TABS
15.0000 mg | ORAL_TABLET | ORAL | Status: DC | PRN
  Administered 2018-11-17 – 2018-11-23 (×19): 15 mg via ORAL
  Filled 2018-11-17 (×19): qty 1

## 2018-11-17 MED ORDER — BACITRACIN ZINC 500 UNIT/GM EX OINT
TOPICAL_OINTMENT | CUTANEOUS | Status: DC | PRN
  Administered 2018-11-17: 1 via TOPICAL

## 2018-11-17 MED ORDER — FENTANYL CITRATE (PF) 100 MCG/2ML IJ SOLN: 25.0000 ug | INTRAMUSCULAR | Status: DC | PRN

## 2018-11-17 MED ORDER — ASPIRIN EC 81 MG PO TBEC
81.0000 mg | DELAYED_RELEASE_TABLET | Freq: Every day | ORAL | Status: DC
  Administered 2018-11-18 – 2018-11-23 (×7): 81 mg via ORAL
  Filled 2018-11-17 (×6): qty 1

## 2018-11-17 MED ORDER — SODIUM CHLORIDE 0.9 % IV SOLN: 250.0000 mL | INTRAVENOUS | Status: DC | PRN

## 2018-11-17 MED ORDER — ATORVASTATIN CALCIUM 80 MG PO TABS
80.0000 mg | ORAL_TABLET | Freq: Every day | ORAL | Status: DC
  Administered 2018-11-18 – 2018-11-22 (×5): 80 mg via ORAL
  Filled 2018-11-17 (×6): qty 1

## 2018-11-17 MED ORDER — POLYETHYLENE GLYCOL 3350 17 G PO PACK: 17.0000 g | PACK | Freq: Every day | ORAL | Status: DC | PRN

## 2018-11-17 MED ORDER — ACETAMINOPHEN 325 MG RE SUPP: 325.0000 mg | RECTAL | Status: DC | PRN

## 2018-11-17 MED ORDER — BACITRACIN ZINC 500 UNIT/GM EX OINT
TOPICAL_OINTMENT | CUTANEOUS | Status: AC
  Filled 2018-11-17: qty 28.35

## 2018-11-17 MED ORDER — ACETAMINOPHEN 325 MG PO TABS: 325.0000 mg | ORAL_TABLET | ORAL | Status: DC | PRN

## 2018-11-17 MED ORDER — PANTOPRAZOLE SODIUM 40 MG PO TBEC
40.0000 mg | DELAYED_RELEASE_TABLET | Freq: Every day | ORAL | Status: DC
  Administered 2018-11-18 – 2018-11-23 (×7): 40 mg via ORAL
  Filled 2018-11-17 (×6): qty 1

## 2018-11-17 MED ORDER — CLOPIDOGREL BISULFATE 75 MG PO TABS
75.0000 mg | ORAL_TABLET | Freq: Every day | ORAL | Status: DC
  Administered 2018-11-18 – 2018-11-23 (×7): 75 mg via ORAL
  Filled 2018-11-17 (×6): qty 1

## 2018-11-17 MED ORDER — SODIUM CHLORIDE 0.9 % IV SOLN
INTRAVENOUS | Status: DC
  Administered 2018-11-17 (×2): via INTRAVENOUS

## 2018-11-17 MED ORDER — PROMETHAZINE HCL 25 MG/ML IJ SOLN: 6.2500 mg | INTRAMUSCULAR | Status: DC | PRN

## 2018-11-17 MED ORDER — SODIUM CHLORIDE 0.9% FLUSH: 3.0000 mL | INTRAVENOUS | Status: DC | PRN

## 2018-11-17 MED ORDER — CEFAZOLIN SODIUM-DEXTROSE 2-4 GM/100ML-% IV SOLN
2.0000 g | Freq: Once | INTRAVENOUS | Status: AC
  Administered 2018-11-17: 16:00:00 2 g via INTRAVENOUS

## 2018-11-17 MED ORDER — INSULIN ASPART 100 UNIT/ML ~~LOC~~ SOLN
0.0000 [IU] | Freq: Three times a day (TID) | SUBCUTANEOUS | Status: DC
  Administered 2018-11-18 (×3): 5 [IU] via SUBCUTANEOUS
  Administered 2018-11-19: 3 [IU] via SUBCUTANEOUS
  Administered 2018-11-19: 11 [IU] via SUBCUTANEOUS
  Administered 2018-11-19: 3 [IU] via SUBCUTANEOUS
  Administered 2018-11-20 (×2): 5 [IU] via SUBCUTANEOUS
  Administered 2018-11-20: 3 [IU] via SUBCUTANEOUS
  Administered 2018-11-21: 8 [IU] via SUBCUTANEOUS
  Administered 2018-11-21: 2 [IU] via SUBCUTANEOUS
  Administered 2018-11-21: 8 [IU] via SUBCUTANEOUS
  Administered 2018-11-22: 5 [IU] via SUBCUTANEOUS
  Administered 2018-11-22: 3 [IU] via SUBCUTANEOUS
  Administered 2018-11-22: 8 [IU] via SUBCUTANEOUS
  Administered 2018-11-23: 5 [IU] via SUBCUTANEOUS

## 2018-11-17 MED ORDER — BISACODYL 10 MG RE SUPP: 10.0000 mg | Freq: Every day | RECTAL | Status: DC | PRN

## 2018-11-17 MED ORDER — CARVEDILOL 3.125 MG PO TABS
6.2500 mg | ORAL_TABLET | Freq: Once | ORAL | Status: AC
  Administered 2018-11-17: 14:00:00 6.25 mg via ORAL

## 2018-11-17 MED ORDER — ALBUMIN HUMAN 5 % IV SOLN
INTRAVENOUS | Status: DC | PRN
  Administered 2018-11-17: 16:00:00 via INTRAVENOUS

## 2018-11-17 MED ORDER — MAGNESIUM SULFATE 2 GM/50ML IV SOLN: 2.0000 g | Freq: Every day | INTRAVENOUS | Status: DC | PRN

## 2018-11-17 MED ORDER — SODIUM CHLORIDE 0.9% FLUSH
3.0000 mL | Freq: Two times a day (BID) | INTRAVENOUS | Status: DC
  Administered 2018-11-17 – 2018-11-23 (×11): 3 mL via INTRAVENOUS

## 2018-11-17 MED ORDER — GUAIFENESIN-DM 100-10 MG/5ML PO SYRP: 15.0000 mL | ORAL_SOLUTION | ORAL | Status: DC | PRN

## 2018-11-17 MED ORDER — CARVEDILOL 3.125 MG PO TABS
ORAL_TABLET | ORAL | Status: AC
  Administered 2018-11-17: 6.25 mg via ORAL
  Filled 2018-11-17: qty 2

## 2018-11-17 MED ORDER — LABETALOL HCL 5 MG/ML IV SOLN: 10.0000 mg | INTRAVENOUS | Status: DC | PRN

## 2018-11-17 MED ORDER — PIPERACILLIN-TAZOBACTAM 3.375 G IVPB
3.3750 g | Freq: Two times a day (BID) | INTRAVENOUS | Status: DC
  Administered 2018-11-17 – 2018-11-23 (×10): 3.375 g via INTRAVENOUS
  Filled 2018-11-17 (×14): qty 50

## 2018-11-17 MED ORDER — INSULIN GLARGINE 100 UNIT/ML ~~LOC~~ SOLN
10.0000 [IU] | Freq: Every day | SUBCUTANEOUS | Status: DC
  Administered 2018-11-17: 10 [IU] via SUBCUTANEOUS
  Filled 2018-11-17 (×2): qty 0.1

## 2018-11-17 MED ORDER — ONDANSETRON HCL 4 MG/2ML IJ SOLN: 4.0000 mg | Freq: Four times a day (QID) | INTRAMUSCULAR | Status: DC | PRN

## 2018-11-17 MED ORDER — VANCOMYCIN HCL IN DEXTROSE 1-5 GM/200ML-% IV SOLN
INTRAVENOUS | Status: AC
  Filled 2018-11-17: qty 200

## 2018-11-17 MED ORDER — HEPARIN SODIUM (PORCINE) 5000 UNIT/ML IJ SOLN
5000.0000 [IU] | Freq: Three times a day (TID) | INTRAMUSCULAR | Status: DC
  Administered 2018-11-18 – 2018-11-19 (×3): 5000 [IU] via SUBCUTANEOUS
  Filled 2018-11-17 (×7): qty 1

## 2018-11-17 MED ORDER — METOPROLOL TARTRATE 5 MG/5ML IV SOLN: 2.0000 mg | INTRAVENOUS | Status: DC | PRN

## 2018-11-17 MED ORDER — PHENOL 1.4 % MT LIQD: 1.0000 | OROMUCOSAL | Status: DC | PRN

## 2018-11-17 MED ORDER — POTASSIUM CHLORIDE CRYS ER 20 MEQ PO TBCR
20.0000 meq | EXTENDED_RELEASE_TABLET | Freq: Every day | ORAL | Status: AC | PRN
  Administered 2018-11-18: 40 meq via ORAL
  Filled 2018-11-17: qty 2

## 2018-11-17 MED ORDER — 0.9 % SODIUM CHLORIDE (POUR BTL) OPTIME
TOPICAL | Status: DC | PRN
  Administered 2018-11-17 (×2): 1000 mL

## 2018-11-17 MED ORDER — ONDANSETRON HCL 4 MG/2ML IJ SOLN
INTRAMUSCULAR | Status: DC | PRN
  Administered 2018-11-17: 4 mg via INTRAVENOUS

## 2018-11-17 MED ORDER — DOCUSATE SODIUM 100 MG PO CAPS
100.0000 mg | ORAL_CAPSULE | Freq: Every day | ORAL | Status: DC
  Administered 2018-11-18 – 2018-11-23 (×4): 100 mg via ORAL
  Filled 2018-11-17 (×5): qty 1

## 2018-11-17 MED ORDER — PROPOFOL 10 MG/ML IV BOLUS
INTRAVENOUS | Status: DC | PRN
  Administered 2018-11-17: 130 mg via INTRAVENOUS

## 2018-11-17 MED ORDER — LANTHANUM CARBONATE 500 MG PO CHEW
2000.0000 mg | CHEWABLE_TABLET | Freq: Three times a day (TID) | ORAL | Status: DC
  Administered 2018-11-18 – 2018-11-23 (×12): 2000 mg via ORAL
  Filled 2018-11-17 (×10): qty 4

## 2018-11-17 MED ORDER — RENA-VITE PO TABS
1.0000 | ORAL_TABLET | Freq: Every day | ORAL | Status: DC
  Administered 2018-11-17 – 2018-11-22 (×6): 1 via ORAL
  Filled 2018-11-17 (×6): qty 1

## 2018-11-17 MED ORDER — ISOSORBIDE MONONITRATE ER 30 MG PO TB24
30.0000 mg | ORAL_TABLET | Freq: Every day | ORAL | Status: DC
  Administered 2018-11-18 – 2018-11-23 (×7): 30 mg via ORAL
  Filled 2018-11-17 (×6): qty 1

## 2018-11-17 MED ORDER — CALCITRIOL 0.25 MCG PO CAPS
1.2500 ug | ORAL_CAPSULE | ORAL | Status: DC
  Administered 2018-11-20 – 2018-11-22 (×2): 1.25 ug via ORAL

## 2018-11-17 SURGICAL SUPPLY — 54 items
BANDAGE ACE 4X5 VEL STRL LF (GAUZE/BANDAGES/DRESSINGS) ×3 IMPLANT
BANDAGE ACE 6X5 VEL STRL LF (GAUZE/BANDAGES/DRESSINGS) ×3 IMPLANT
BANDAGE ELASTIC 4 VELCRO ST LF (GAUZE/BANDAGES/DRESSINGS) ×2 IMPLANT
BANDAGE ELASTIC 6 VELCRO ST LF (GAUZE/BANDAGES/DRESSINGS) ×2 IMPLANT
BANDAGE ESMARK 6X9 LF (GAUZE/BANDAGES/DRESSINGS) ×1 IMPLANT
BLADE SAW SAG 73X25 THK (BLADE) ×1
BLADE SAW SGTL 73X25 THK (BLADE) ×2 IMPLANT
BNDG CMPR 9X6 STRL LF SNTH (GAUZE/BANDAGES/DRESSINGS) ×1
BNDG COHESIVE 6X5 TAN STRL LF (GAUZE/BANDAGES/DRESSINGS) ×3 IMPLANT
BNDG ESMARK 6X9 LF (GAUZE/BANDAGES/DRESSINGS) ×3
BNDG GAUZE ELAST 4 BULKY (GAUZE/BANDAGES/DRESSINGS) ×4 IMPLANT
CANISTER SUCT 3000ML PPV (MISCELLANEOUS) ×3 IMPLANT
CLIP VESOCCLUDE MED 6/CT (CLIP) ×3 IMPLANT
COVER BACK TABLE 60X90IN (DRAPES) ×1 IMPLANT
COVER SURGICAL LIGHT HANDLE (MISCELLANEOUS) ×6 IMPLANT
COVER WAND RF STERILE (DRAPES) ×1 IMPLANT
DRAIN CHANNEL 19F RND (DRAIN) IMPLANT
DRAPE HALF SHEET 40X57 (DRAPES) ×3 IMPLANT
DRAPE ORTHO SPLIT 77X108 STRL (DRAPES) ×6
DRAPE SURG ORHT 6 SPLT 77X108 (DRAPES) ×2 IMPLANT
DRAPE U-SHAPE 47X51 STRL (DRAPES) IMPLANT
DRSG ADAPTIC 3X8 NADH LF (GAUZE/BANDAGES/DRESSINGS) ×3 IMPLANT
ELECT CAUTERY BLADE 6.4 (BLADE) ×3 IMPLANT
ELECT REM PT RETURN 9FT ADLT (ELECTROSURGICAL) ×3
ELECTRODE REM PT RTRN 9FT ADLT (ELECTROSURGICAL) ×1 IMPLANT
EVACUATOR SILICONE 100CC (DRAIN) IMPLANT
GAUZE SPONGE 4X4 12PLY STRL (GAUZE/BANDAGES/DRESSINGS) ×6 IMPLANT
GAUZE SPONGE 4X4 16PLY XRAY LF (GAUZE/BANDAGES/DRESSINGS) ×2 IMPLANT
GLOVE BIO SURGEON STRL SZ 6.5 (GLOVE) ×2 IMPLANT
GLOVE BIO SURGEON STRL SZ7.5 (GLOVE) ×9 IMPLANT
GLOVE BIO SURGEONS STRL SZ 6.5 (GLOVE) ×2
GLOVE BIOGEL PI IND STRL 8 (GLOVE) ×1 IMPLANT
GLOVE BIOGEL PI INDICATOR 8 (GLOVE) ×2
GOWN STRL REUS W/ TWL XL LVL3 (GOWN DISPOSABLE) ×1 IMPLANT
GOWN STRL REUS W/TWL XL LVL3 (GOWN DISPOSABLE) ×3
KIT BASIN OR (CUSTOM PROCEDURE TRAY) ×3 IMPLANT
KIT TURNOVER KIT B (KITS) ×3 IMPLANT
NS IRRIG 1000ML POUR BTL (IV SOLUTION) ×3 IMPLANT
PACK GENERAL/GYN (CUSTOM PROCEDURE TRAY) ×3 IMPLANT
PAD ARMBOARD 7.5X6 YLW CONV (MISCELLANEOUS) ×6 IMPLANT
STAPLER VISISTAT 35W (STAPLE) ×3 IMPLANT
STOCKINETTE IMPERVIOUS LG (DRAPES) ×3 IMPLANT
SUT ETHILON 3 0 PS 1 (SUTURE) IMPLANT
SUT SILK 0 TIES 10X30 (SUTURE) ×1 IMPLANT
SUT SILK 2 0 (SUTURE) ×3
SUT SILK 2 0 SH CR/8 (SUTURE) ×3 IMPLANT
SUT SILK 2-0 18XBRD TIE 12 (SUTURE) ×1 IMPLANT
SUT SILK 3 0 (SUTURE) ×3
SUT SILK 3-0 18XBRD TIE 12 (SUTURE) IMPLANT
SUT VIC AB 2-0 CT1 18 (SUTURE) ×6 IMPLANT
SUT VIC AB 3-0 SH 18 (SUTURE) IMPLANT
TOWEL GREEN STERILE (TOWEL DISPOSABLE) ×6 IMPLANT
UNDERPAD 30X30 (UNDERPADS AND DIAPERS) ×3 IMPLANT
WATER STERILE IRR 1000ML POUR (IV SOLUTION) ×3 IMPLANT

## 2018-11-17 NOTE — Progress Notes (Signed)
Inpatient Diabetes Program Recommendations  AACE/ADA: New Consensus Statement on Inpatient Glycemic Control (2015)  Target Ranges:  Prepandial:   less than 140 mg/dL      Peak postprandial:   less than 180 mg/dL (1-2 hours)      Critically ill patients:  140 - 180 mg/dL   Results for [name], [name]. (MRN [id number]) as of 11/17/2018 16:22  Ref. Range 11/17/2018 14:14  Glucose Latest Ref Range: 70 - 99 mg/dL 182 (H)    Admit for R BKA  History: Type 1 DM, ESRD  Home DM Meds: Lantus 14 units QHS  Current Orders: None yet--Still in OR as of 4:20pm     MD- Please consider ordering the following insulins post-op:  1. Lantus 14 units QHS (please start tonight)  Patient's home dose  2. Novolog Sensitive Correction Scale/ SSI (0-9 units) Q4 hours      --Will follow patient during hospitalization--  Wyn Quaker RN, MSN, CDE Diabetes Coordinator Inpatient Glycemic Control Team Team Pager: [phone] (8a-5p)

## 2018-11-17 NOTE — Transfer of Care (Signed)
Immediate Anesthesia Transfer of Care Note  Patient: [name] Cassedy Sr.  Procedure(s) Performed: AMPUTATION BELOW KNEE (Right Leg Lower)  Patient Location: PACU  Anesthesia Type:General  Level of Consciousness: awake and alert   Airway & Oxygen Therapy: Patient Spontanous Breathing and Patient connected to face mask oxygen  Post-op Assessment: Report given to RN and Post -op Vital signs reviewed and stable  Post vital signs: Reviewed and stable  Last Vitals:  Vitals Value Taken Time  BP    Temp 36.7 C 11/17/2018  4:49 PM  Pulse 92 11/17/2018  4:49 PM  Resp 25 11/17/2018  4:49 PM  SpO2 99 % 11/17/2018  4:49 PM  Vitals shown include unvalidated device data.  Last Pain:  Vitals:   11/17/18 1404  TempSrc: Oral  PainSc: 10-Worst pain ever      Patients Stated Pain Goal: 4 (94/07/68 0881)  Complications: No apparent anesthesia complications

## 2018-11-17 NOTE — Progress Notes (Signed)
History of Present Illness: This is a 43 y.o. male history of left lower extremity amputation.  Is undergone right lower extremity revascularization followed by toe amputation followed by transmetatarsal amputation which is failed to heal.  Was recently admitted with sepsis secondary to the foot infection treated with antibiotics.  Presents here from dialysis today with purulence draining from his foot.  He has significant pain in the right foot.  Past Medical History:  Diagnosis Date  . Anemia   . Atherosclerosis of lower extremity ([place])   . Blood clot in vein    right calf  . CAD (coronary artery disease)   . Cataracts, bilateral   . Chronic combined systolic and diastolic heart failure ([place])   . Complication of anesthesia   . Depression   . ESRD (end stage renal disease) on dialysis [facility])    "MWF [employer]" (03/08/2017)  . GERD (gastroesophageal reflux disease)   . Heart murmur   . History of blood transfusion    "related to OR"  . Hypertension   . Myocardial infarction ([place])     " light"  . Nonhealing surgical wound    nonviable tissue  . PONV (postoperative nausea and vomiting)   . S/P unilateral BKA (below knee amputation), left ([place])   . Type II diabetes mellitus ([place])   . Wears glasses     Past Surgical History:  Procedure Laterality Date  . ABDOMINAL AORTOGRAM N/A 11/02/2016   Procedure: Abdominal Aortogram;  Surgeon: Waynetta Sandy, MD;  Location: [place] [facility];  Service: Cardiovascular;  Laterality: N/A;  . ABDOMINAL AORTOGRAM W/LOWER EXTREMITY Right 08/07/2018   Procedure: ABDOMINAL AORTOGRAM W/LOWER EXTREMITY;  Surgeon: [name], MD;  Location: [place] [facility];  Service: Cardiovascular;  Laterality: Right;  . AMPUTATION Left 09/27/2013   Procedure: LEFT GREAT TOE AMPUTATION;  Surgeon: [name], MD;  Location: [place];  Service: Orthopedics;  Laterality: Left;  . AMPUTATION Right 08/15/2015   Procedure: Right Great Toe  Amputation;  Surgeon: [name], MD;  Location: [place];  Service: Orthopedics;  Laterality: Right;  . AMPUTATION Left 11/05/2016   Procedure: TRANSMETATARSAL AMPUTATION LEFT FOOT;  Surgeon: [name], MD;  Location: [place];  Service: Orthopedics;  Laterality: Left;  . AMPUTATION Left 03/11/2017   Procedure: LEFT BELOW KNEE AMPUTATION;  Surgeon: [name], MD;  Location: [place];  Service: Orthopedics;  Laterality: Left;  . AMPUTATION Right 03/11/2017   Procedure: RIGHT 2ND TOE AMPUTATION;  Surgeon: [name], MD;  Location: [place];  Service: Orthopedics;  Laterality: Right;  . AV FISTULA PLACEMENT  left arm  . CORONARY ARTERY BYPASS GRAFT N/A 11/04/2017   Procedure: CORONARY ARTERY BYPASS GRAFTING (CABG) x three, using left internal mammary artery and right    leg greater saphenous vein;  Surgeon: Melrose Nakayama, MD;  Location: [place];  Service: Open Heart Surgery;  Laterality: N/A;  . FASCIOTOMY Right 08/07/2018   Procedure: FOUR COMPARTMENT FASCIOTOMY OF RIGHT LOWER LEG;  Surgeon: Rosetta Posner, MD;  Location: [place];  Service: Vascular;  Laterality: Right;  . FASCIOTOMY CLOSURE Right 08/10/2018   Procedure: FASCIOTOMY CLOSURE RIGHT LOWER EXTREMITY;  Surgeon: [name], MD;  Location: [place];  Service: Vascular;  Laterality: Right;  . HEMATOMA EVACUATION Right 08/07/2018   Procedure: EVACUATION HEMATOMA;  Surgeon: Rosetta Posner, MD;  Location: [place];  Service: Vascular;  Laterality: Right;  . LEFT HEART CATH AND CORONARY ANGIOGRAPHY  N/A 10/31/2017   Procedure: LEFT HEART CATH AND CORONARY ANGIOGRAPHY;  Surgeon: Troy Sine, MD;  Location: [place] [facility];  Service: Cardiovascular;  Laterality: N/A;  . LEFT HEART CATHETERIZATION WITH CORONARY ANGIOGRAM N/A 09/13/2014   Procedure: LEFT HEART CATHETERIZATION WITH CORONARY ANGIOGRAM;  Surgeon: Sinclair Grooms, MD;  Location: [facility] CATH LAB;  Service: Cardiovascular;  Laterality: N/A;  . LOWER EXTREMITY ANGIOGRAPHY  Bilateral 11/02/2016   Procedure: Lower Extremity Angiography;  Surgeon: Waynetta Sandy, MD;  Location: [place] [facility];  Service: Cardiovascular;  Laterality: Bilateral;  . PERIPHERAL VASCULAR ATHERECTOMY Left 11/02/2016   Procedure: Peripheral Vascular Atherectomy;  Surgeon: Waynetta Sandy, MD;  Location: [place] [facility];  Service: Cardiovascular;  Laterality: Left;  PERONEAL  . PERIPHERAL VASCULAR ATHERECTOMY Right 08/07/2018   Procedure: PERIPHERAL VASCULAR ATHERECTOMY;  Surgeon: [name], MD;  Location: [place] [facility];  Service: Cardiovascular;  Laterality: Right;  Peroneal artery  . PERIPHERAL VASCULAR BALLOON ANGIOPLASTY Right 08/07/2018   Procedure: PERIPHERAL VASCULAR BALLOON ANGIOPLASTY;  Surgeon: [name], MD;  Location: [place] [facility];  Service: Cardiovascular;  Laterality: Right;  anterior tibial artery  . STUMP REVISION Left 01/11/2017   Procedure: Revision Left Transmetatarsal Amputation;  Surgeon: [name], MD;  Location: [place];  Service: Orthopedics;  Laterality: Left;  . TEE WITHOUT CARDIOVERSION N/A 11/04/2017   Procedure: TRANSESOPHAGEAL ECHOCARDIOGRAM (TEE);  Surgeon: Melrose Nakayama, MD;  Location: [place];  Service: Open Heart Surgery;  Laterality: N/A;  . TRANSMETATARSAL AMPUTATION Right 08/10/2018   Procedure: RIGHT FIFTH TOE AMPUTATION;  Surgeon: [name], MD;  Location: [place];  Service: Vascular;  Laterality: Right;  . TRANSMETATARSAL AMPUTATION Right 09/14/2018   Procedure: TRANSMETATARSAL AMPUTATION;  Surgeon: [name], MD;  Location: [place];  Service: Vascular;  Laterality: Right;    Allergies  Allergen Reactions  . Coconut Oil Anaphylaxis and Other (See Comments)    Can use topically, allergic to coconut foods     Prior to Admission medications   Medication Sig Start Date End Date Taking? Authorizing Provider  acetaminophen (TYLENOL) 325 MG tablet Take 2 tablets (650 mg  total) by mouth every 4 (four) hours as needed for headache or mild pain. 09/08/18  Yes Geradine Girt, DO  aspirin EC 81 MG tablet Take 1 tablet (81 mg total) by mouth daily. 02/07/18  Yes Burtis Junes, NP  atorvastatin (LIPITOR) 80 MG tablet Take 1 tablet (80 mg total) by mouth daily at 6 PM. 08/17/18  Yes Kayleen Memos, DO  calcitRIOL (ROCALTROL) 0.25 MCG capsule Take 5 capsules (1.25 mcg total) by mouth every Monday, Wednesday, and Friday with hemodialysis. 11/09/17  Yes Conte, Tessa N, PA-C  carvedilol (COREG) 6.25 MG tablet Take 1 tablet (6.25 mg total) by mouth 2 (two) times daily for 30 days. 11/07/18 12/07/18 Yes Rehman, Areeg N, DO  clopidogrel (PLAVIX) 75 MG tablet Take 1 tablet (75 mg total) by mouth daily. 02/07/18  Yes Burtis Junes, NP  insulin glargine (LANTUS) 100 UNIT/ML injection Inject 0.1 mLs (10 Units total) into the skin at bedtime. Patient taking differently: Inject 14 Units into the skin at bedtime.  09/08/18  Yes Eulogio Bear U, DO  isosorbide mononitrate (IMDUR) 30 MG 24 hr tablet Take 1 tablet (30 mg total) by mouth daily. 09/08/18  Yes Vann, Jessica U, DO  lanthanum (FOSRENOL) 1000 MG chewable tablet Chew 2 tablets (2,000 mg total) by mouth 3 (three)  times daily with meals. 08/16/15  Yes Short, Noah Delaine, MD  morphine (MSIR) 15 MG tablet TAKE 1 TABLET (15 MG TOTAL) BY MOUTH EVERY 4 (FOUR) HOURS AS NEEDED FOR UP TO 5 DAYS FOR SEVERE PAIN. 11/13/18  Yes [provider]  multivitamin (RENA-VIT) TABS tablet Take 1 tablet by mouth at bedtime. 08/17/18  Yes Hall, Carole N, DO  ondansetron (ZOFRAN) 4 MG tablet Take 4 mg by mouth every 6 (six) hours as needed for nausea or vomiting.   Yes [provider]  vancomycin 1,000 mg in sodium chloride 0.9 % 250 mL Inject 1,000 mg into the vein every Monday, Wednesday, and Friday with hemodialysis. For six weeks 11/08/18 12/16/18 Yes Rehman, Areeg N, DO    Social History   Socioeconomic History  . Marital status: Legally  Separated    Spouse name: Not on file  . Number of children: Not on file  . Years of education: Not on file  . Highest education level: Not on file  Occupational History  . Not on file  Social Needs  . Financial resource strain: Not on file  . Food insecurity:    Worry: Not on file    Inability: Not on file  . Transportation needs:    Medical: Not on file    Non-medical: Not on file  Tobacco Use  . Smoking status: Current Every Day Smoker    Packs/day: 0.50    Years: 26.00    Pack years: 13.00    Types: Cigarettes    Last attempt to quit: 08/17/2018    Years since quitting: 0.2  . Smokeless tobacco: Never Used  Substance and Sexual Activity  . Alcohol use: Not Currently    Comment:  "haven't took a drink in over 5 years"  . Drug use: Yes    Types: Marijuana    Comment: occasional  . Sexual activity: Yes  Lifestyle  . Physical activity:    Days per week: Not on file    Minutes per session: Not on file  . Stress: Not on file  Relationships  . Social connections:    Talks on phone: Not on file    Gets together: Not on file    Attends religious service: Not on file    Active member of club or organization: Not on file    Attends meetings of clubs or organizations: Not on file    Relationship status: Not on file  . Intimate partner violence:    Fear of current or ex partner: Not on file    Emotionally abused: Not on file    Physically abused: Not on file    Forced sexual activity: Not on file  Other Topics Concern  . Not on file  Social History Narrative  . Not on file     Family History  Problem Relation Age of Onset  . Heart failure Mother   . Hypertension Mother     ROS:  Right foot pain  Physical Examination  Vitals:   11/17/18 1146  BP: 131/69  Pulse: (!) 101  Resp: 20  Temp: 98.2 F (36.8 C)  SpO2: 93%   Body mass index is 22.73 kg/m.  General:  nad Cardiac: no palpable pedal pulses MSK: TMA with black eschar, there is crepitance and  purulence draining from the medial aspect of the foot near the ankle.  There is no erythema or crepitus of the leg. Neurologic: A&O X 3   CBC    Component Value Date/Time  WBC 17.9 (H) 11/07/2018 0319   RBC 3.85 (L) 11/07/2018 0319   HGB 9.8 (L) 11/07/2018 0319   HGB 14.3 02/07/2018 0937   HCT 30.9 (L) 11/07/2018 0319   HCT 42.8 02/07/2018 0937   PLT 377 11/07/2018 0319   PLT 369 02/07/2018 0937   MCV 80.3 11/07/2018 0319   MCV 82 02/07/2018 0937   MCH 25.5 (L) 11/07/2018 0319   MCHC 31.7 11/07/2018 0319   RDW 17.6 (H) 11/07/2018 0319   RDW 21.5 (H) 02/07/2018 0937   LYMPHSABS 1.3 11/03/2018 0848   MONOABS 1.0 11/03/2018 0848   EOSABS 0.1 11/03/2018 0848   BASOSABS 0.0 11/03/2018 0848    BMET    Component Value Date/Time   NA 125 (L) 11/07/2018 0319   NA 133 (L) 02/07/2018 0937   K 3.4 (L) 11/07/2018 0319   CL 88 (L) 11/07/2018 0319   CO2 24 11/07/2018 0319   GLUCOSE 299 (H) 11/07/2018 0319   GLUCOSE 262 03/06/2008   BUN 39 (H) 11/07/2018 0319   BUN 37 (H) 02/07/2018 0937   CREATININE 7.87 (H) 11/07/2018 0319   CALCIUM 8.9 11/07/2018 0319   GFRNONAA 8 (L) 11/07/2018 0319   GFRAA 9 (L) 11/07/2018 0319    COAGS: Lab Results  Component Value Date   INR 1.25 11/04/2017   INR 1.00 10/30/2017   INR 1.05 09/13/2014     ASSESSMENT/PLAN: This is a 43 y.o. male here with purulent draining from right foot status post TMA that is nonhealing.  I discussed with him he needs below-knee amputation.  He has not eaten today.  We will attempt to get him admitted in preparation for below versus above-knee amputation today versus tomorrow.  I discussed with on-call vascular surgeon Dr. Carlis Abbott who will evaluate when he arrives to the hospital.  Erlene Quan C. Donzetta Matters, MD [facility] Office: [phone] Pager: [phone]

## 2018-11-17 NOTE — Anesthesia Postprocedure Evaluation (Signed)
Anesthesia Post Note  Patient: Ruven Corradi Sr.  Procedure(s) Performed: AMPUTATION BELOW KNEE (Right Leg Lower)     Patient location during evaluation: PACU Anesthesia Type: General Level of consciousness: awake and alert Pain management: pain level controlled Vital Signs Assessment: post-procedure vital signs reviewed and stable Respiratory status: spontaneous breathing, nonlabored ventilation, respiratory function stable and patient connected to nasal cannula oxygen Cardiovascular status: blood pressure returned to baseline and stable Postop Assessment: no apparent nausea or vomiting Anesthetic complications: no    Last Vitals:  Vitals:   11/17/18 1702 11/17/18 1717  BP: (!) 112/40 (!) 117/49  Pulse: 91 88  Resp: (!) 31 (!) 29  Temp:    SpO2: (!) 86% 100%    Last Pain:  Vitals:   11/17/18 1720  TempSrc:   PainSc: 0-No pain                 Kristy Schomburg S

## 2018-11-17 NOTE — Progress Notes (Signed)
Patient arrived from PACU, to 4e07 patient placed on monitor and vital signs obtained. CCMD verified. Patient sweaty CBG 236, temp 98.9. Dressing to stump clean dry and intact. Patient sleeping currently will monitor patient. Constant [place], Bettina Gavia rN

## 2018-11-17 NOTE — H&P (Signed)
History of Present Illness: This is a 43 y.o. male with history of left lower extremity amputation and has undergone right lower extremity revascularization followed by toe amputation followed by transmetatarsal amputation which has failed to heal.  Was recently admitted with sepsis secondary to the foot infection treated with antibiotics and refused recommendation for right below knee amputation.  Presented to clinic today from dialysis with purulence draining from his foot.  He has significant pain in the right foot.  States had a fever earlier in the week.      Past Medical History:  Diagnosis Date  . Anemia   . Atherosclerosis of lower extremity ([place])   . Blood clot in vein    right calf  . CAD (coronary artery disease)   . Cataracts, bilateral   . Chronic combined systolic and diastolic heart failure ([place])   . Complication of anesthesia   . Depression   . ESRD (end stage renal disease) on dialysis [facility])    "MWF [employer]" (03/08/2017)  . GERD (gastroesophageal reflux disease)   . Heart murmur   . History of blood transfusion    "related to OR"  . Hypertension   . Myocardial infarction ([place])     " light"  . Nonhealing surgical wound    nonviable tissue  . PONV (postoperative nausea and vomiting)   . S/P unilateral BKA (below knee amputation), left ([place])   . Type II diabetes mellitus ([place])   . Wears glasses          Past Surgical History:  Procedure Laterality Date  . ABDOMINAL AORTOGRAM N/A 11/02/2016   Procedure: Abdominal Aortogram;  Surgeon: Waynetta Sandy, MD;  Location: [place] [facility];  Service: Cardiovascular;  Laterality: N/A;  . ABDOMINAL AORTOGRAM W/LOWER EXTREMITY Right 08/07/2018   Procedure: ABDOMINAL AORTOGRAM W/LOWER EXTREMITY;  Surgeon: [name], MD;  Location: [place] [facility];  Service: Cardiovascular;  Laterality: Right;  . AMPUTATION Left 09/27/2013   Procedure: LEFT GREAT TOE AMPUTATION;  Surgeon:  [name], MD;  Location: [place];  Service: Orthopedics;  Laterality: Left;  . AMPUTATION Right 08/15/2015   Procedure: Right Great Toe Amputation;  Surgeon: [name], MD;  Location: [place];  Service: Orthopedics;  Laterality: Right;  . AMPUTATION Left 11/05/2016   Procedure: TRANSMETATARSAL AMPUTATION LEFT FOOT;  Surgeon: [name], MD;  Location: [place];  Service: Orthopedics;  Laterality: Left;  . AMPUTATION Left 03/11/2017   Procedure: LEFT BELOW KNEE AMPUTATION;  Surgeon: [name], MD;  Location: [place];  Service: Orthopedics;  Laterality: Left;  . AMPUTATION Right 03/11/2017   Procedure: RIGHT 2ND TOE AMPUTATION;  Surgeon: [name], MD;  Location: [place];  Service: Orthopedics;  Laterality: Right;  . AV FISTULA PLACEMENT  left arm  . CORONARY ARTERY BYPASS GRAFT N/A 11/04/2017   Procedure: CORONARY ARTERY BYPASS GRAFTING (CABG) x three, using left internal mammary artery and right    leg greater saphenous vein;  Surgeon: Melrose Nakayama, MD;  Location: [place];  Service: Open Heart Surgery;  Laterality: N/A;  . FASCIOTOMY Right 08/07/2018   Procedure: FOUR COMPARTMENT FASCIOTOMY OF RIGHT LOWER LEG;  Surgeon: Rosetta Posner, MD;  Location: [place];  Service: Vascular;  Laterality: Right;  . FASCIOTOMY CLOSURE Right 08/10/2018   Procedure: FASCIOTOMY CLOSURE RIGHT LOWER EXTREMITY;  Surgeon: [name], MD;  Location: [place];  Service: Vascular;  Laterality: Right;  . HEMATOMA EVACUATION Right 08/07/2018  Procedure: EVACUATION HEMATOMA;  Surgeon: Rosetta Posner, MD;  Location: [place];  Service: Vascular;  Laterality: Right;  . LEFT HEART CATH AND CORONARY ANGIOGRAPHY N/A 10/31/2017   Procedure: LEFT HEART CATH AND CORONARY ANGIOGRAPHY;  Surgeon: Troy Sine, MD;  Location: [place] [facility];  Service: Cardiovascular;  Laterality: N/A;  . LEFT HEART CATHETERIZATION WITH CORONARY ANGIOGRAM N/A 09/13/2014   Procedure: LEFT HEART CATHETERIZATION WITH  CORONARY ANGIOGRAM;  Surgeon: Sinclair Grooms, MD;  Location: [facility];  Service: Cardiovascular;  Laterality: N/A;  . LOWER EXTREMITY ANGIOGRAPHY Bilateral 11/02/2016   Procedure: Lower Extremity Angiography;  Surgeon: Waynetta Sandy, MD;  Location: [place] [facility];  Service: Cardiovascular;  Laterality: Bilateral;  . PERIPHERAL VASCULAR ATHERECTOMY Left 11/02/2016   Procedure: Peripheral Vascular Atherectomy;  Surgeon: Waynetta Sandy, MD;  Location: [place] [facility];  Service: Cardiovascular;  Laterality: Left;  PERONEAL  . PERIPHERAL VASCULAR ATHERECTOMY Right 08/07/2018   Procedure: PERIPHERAL VASCULAR ATHERECTOMY;  Surgeon: [name], MD;  Location: [place] [facility];  Service: Cardiovascular;  Laterality: Right;  Peroneal artery  . PERIPHERAL VASCULAR BALLOON ANGIOPLASTY Right 08/07/2018   Procedure: PERIPHERAL VASCULAR BALLOON ANGIOPLASTY;  Surgeon: [name], MD;  Location: [place] [facility];  Service: Cardiovascular;  Laterality: Right;  anterior tibial artery  . STUMP REVISION Left 01/11/2017   Procedure: Revision Left Transmetatarsal Amputation;  Surgeon: [name], MD;  Location: [place];  Service: Orthopedics;  Laterality: Left;  . TEE WITHOUT CARDIOVERSION N/A 11/04/2017   Procedure: TRANSESOPHAGEAL ECHOCARDIOGRAM (TEE);  Surgeon: Melrose Nakayama, MD;  Location: [place];  Service: Open Heart Surgery;  Laterality: N/A;  . TRANSMETATARSAL AMPUTATION Right 08/10/2018   Procedure: RIGHT FIFTH TOE AMPUTATION;  Surgeon: [name], MD;  Location: [place];  Service: Vascular;  Laterality: Right;  . TRANSMETATARSAL AMPUTATION Right 09/14/2018   Procedure: TRANSMETATARSAL AMPUTATION;  Surgeon: [name], MD;  Location: [place];  Service: Vascular;  Laterality: Right;         Allergies  Allergen Reactions  . Coconut Oil Anaphylaxis and Other (See Comments)    Can use topically, allergic to coconut foods             Prior to Admission medications   Medication Sig Start Date End Date Taking? Authorizing Provider  acetaminophen (TYLENOL) 325 MG tablet Take 2 tablets (650 mg total) by mouth every 4 (four) hours as needed for headache or mild pain. 09/08/18  Yes Geradine Girt, DO  aspirin EC 81 MG tablet Take 1 tablet (81 mg total) by mouth daily. 02/07/18  Yes Burtis Junes, NP  atorvastatin (LIPITOR) 80 MG tablet Take 1 tablet (80 mg total) by mouth daily at 6 PM. 08/17/18  Yes Kayleen Memos, DO  calcitRIOL (ROCALTROL) 0.25 MCG capsule Take 5 capsules (1.25 mcg total) by mouth every Monday, Wednesday, and Friday with hemodialysis. 11/09/17  Yes Conte, Tessa N, PA-C  carvedilol (COREG) 6.25 MG tablet Take 1 tablet (6.25 mg total) by mouth 2 (two) times daily for 30 days. 11/07/18 12/07/18 Yes Rehman, Areeg N, DO  clopidogrel (PLAVIX) 75 MG tablet Take 1 tablet (75 mg total) by mouth daily. 02/07/18  Yes Burtis Junes, NP  insulin glargine (LANTUS) 100 UNIT/ML injection Inject 0.1 mLs (10 Units total) into the skin at bedtime. Patient taking differently: Inject 14 Units into the skin at bedtime.  09/08/18  Yes Eulogio Bear U, DO  isosorbide mononitrate (  IMDUR) 30 MG 24 hr tablet Take 1 tablet (30 mg total) by mouth daily. 09/08/18  Yes Vann, Jessica U, DO  lanthanum (FOSRENOL) 1000 MG chewable tablet Chew 2 tablets (2,000 mg total) by mouth 3 (three) times daily with meals. 08/16/15  Yes Short, Noah Delaine, MD  morphine (MSIR) 15 MG tablet TAKE 1 TABLET (15 MG TOTAL) BY MOUTH EVERY 4 (FOUR) HOURS AS NEEDED FOR UP TO 5 DAYS FOR SEVERE PAIN. 11/13/18  Yes [provider]  multivitamin (RENA-VIT) TABS tablet Take 1 tablet by mouth at bedtime. 08/17/18  Yes Hall, Carole N, DO  ondansetron (ZOFRAN) 4 MG tablet Take 4 mg by mouth every 6 (six) hours as needed for nausea or vomiting.   Yes [provider]  vancomycin 1,000 mg in sodium chloride 0.9 % 250 mL Inject 1,000 mg into the  vein every Monday, Wednesday, and Friday with hemodialysis. For six weeks 11/08/18 12/16/18 Yes Rehman, Areeg N, DO    Social History        Socioeconomic History  . Marital status: Legally Separated    Spouse name: Not on file  . Number of children: Not on file  . Years of education: Not on file  . Highest education level: Not on file  Occupational History  . Not on file  Social Needs  . Financial resource strain: Not on file  . Food insecurity:    Worry: Not on file    Inability: Not on file  . Transportation needs:    Medical: Not on file    Non-medical: Not on file  Tobacco Use  . Smoking status: Current Every Day Smoker    Packs/day: 0.50    Years: 26.00    Pack years: 13.00    Types: Cigarettes    Last attempt to quit: 08/17/2018    Years since quitting: 0.2  . Smokeless tobacco: Never Used  Substance and Sexual Activity  . Alcohol use: Not Currently    Comment:  "haven't took a drink in over 5 years"  . Drug use: Yes    Types: Marijuana    Comment: occasional  . Sexual activity: Yes  Lifestyle  . Physical activity:    Days per week: Not on file    Minutes per session: Not on file  . Stress: Not on file  Relationships  . Social connections:    Talks on phone: Not on file    Gets together: Not on file    Attends religious service: Not on file    Active member of club or organization: Not on file    Attends meetings of clubs or organizations: Not on file    Relationship status: Not on file  . Intimate partner violence:    Fear of current or ex partner: Not on file    Emotionally abused: Not on file    Physically abused: Not on file    Forced sexual activity: Not on file  Other Topics Concern  . Not on file  Social History Narrative  . Not on file          Family History  Problem Relation Age of Onset  . Heart failure Mother   . Hypertension Mother     ROS:  Right foot pain  Physical  Examination     Vitals:   11/17/18 1146  BP: 131/69  Pulse: (!) 101  Resp: 20  Temp: 98.2 F (36.8 C)  SpO2: 93%   Body mass index is 22.73 kg/m.  General:  NAD, in wheelchair. Cardiac: no palpable pedal pulses in right leg MSK: TMA with black eschar, there is crepitance and purulence draining from the medial aspect of the foot near the ankle.  There is no erythema or crepitus of the leg. Neurologic: A&O X 3   CBC         Component Value Date/Time   WBC 17.9 (H) 11/07/2018 0319   RBC 3.85 (L) 11/07/2018 0319   HGB 9.8 (L) 11/07/2018 0319   HGB 14.3 02/07/2018 0937   HCT 30.9 (L) 11/07/2018 0319   HCT 42.8 02/07/2018 0937   PLT 377 11/07/2018 0319   PLT 369 02/07/2018 0937   MCV 80.3 11/07/2018 0319   MCV 82 02/07/2018 0937   MCH 25.5 (L) 11/07/2018 0319   MCHC 31.7 11/07/2018 0319   RDW 17.6 (H) 11/07/2018 0319   RDW 21.5 (H) 02/07/2018 0937   LYMPHSABS 1.3 11/03/2018 0848   MONOABS 1.0 11/03/2018 0848   EOSABS 0.1 11/03/2018 0848   BASOSABS 0.0 11/03/2018 0848    BMET         Component Value Date/Time   NA 125 (L) 11/07/2018 0319   NA 133 (L) 02/07/2018 0937   K 3.4 (L) 11/07/2018 0319   CL 88 (L) 11/07/2018 0319   CO2 24 11/07/2018 0319   GLUCOSE 299 (H) 11/07/2018 0319   GLUCOSE 262 03/06/2008   BUN 39 (H) 11/07/2018 0319   BUN 37 (H) 02/07/2018 0937   CREATININE 7.87 (H) 11/07/2018 0319   CALCIUM 8.9 11/07/2018 0319   GFRNONAA 8 (L) 11/07/2018 0319   GFRAA 9 (L) 11/07/2018 0319    COAGS:      Lab Results  Component Value Date   INR 1.25 11/04/2017   INR 1.00 10/30/2017   INR 1.05 09/13/2014     ASSESSMENT/PLAN: This is a 42 y.o. male here with purulent draining from right foot status post TMA that is nonhealing after right lower extremity revascualrization.  Plan for right BKA today after risks and benefits discussed with patient.    [name], MD [facility] [place] Office: [phone] Pager: [place]

## 2018-11-17 NOTE — Anesthesia Procedure Notes (Signed)
Procedure Name: Intubation Date/Time: 11/17/2018 3:39 PM Performed by: Eligha Bridegroom, CRNA Pre-anesthesia Checklist: Patient identified, Emergency Drugs available, Suction available, Patient being monitored and Timeout performed Patient Re-evaluated:Patient Re-evaluated prior to induction Oxygen Delivery Method: Circle system utilized Preoxygenation: Pre-oxygenation with 100% oxygen Induction Type: IV induction and Rapid sequence Laryngoscope Size: Mac and 4 Grade View: Grade I Tube type: Oral Tube size: 7.5 mm Number of attempts: 1 Airway Equipment and Method: Stylet Placement Confirmation: ETT inserted through vocal cords under direct vision,  positive ETCO2 and breath sounds checked- equal and bilateral Secured at: 22 cm Tube secured with: Tape Dental Injury: Teeth and Oropharynx as per pre-operative assessment

## 2018-11-17 NOTE — Op Note (Signed)
Date: November 17, 2018  Preoperative diagnosis: Nonhealing and necrotic right TMA in the setting of critical limb ischemia  Postoperative diagnosis: Same  Procedure: 1.  Right below-knee amputation  Surgeon: Dr. [name], MD  Assistant: Leontine Locket, PA  Indications: Patient is a 43 year old male with multiple comorbidities including end-stage renal disease and diabetes that was recently evaluated for critical limb ischemia with tissue loss in the setting of a foot wound.  He ultimately underwent tibial intervention with a toe amputation that failed to heal and then required a TMA.  His TMA has been nonhealing for some time and we had recommended higher level amputation which he had refused.  He presented to clinic today with pus draining from his foot and we subsequently admitted him to the hospital for IV antibiotics and planned BKA after risks and benefits were discussed including non-healing.  Findings: The nonhealing TMA was prepped out of the field.  Right BKA was performed with formalization and all the tissue appeared viable and healthy.  There was no purulence in the surgical wound where we performed the formalization of the BKA.  Details: The patient was taken to the operating room after informed consent was obtained.  He was placed on the operating table in supine position.  His right leg was prepped and draped in usual sterile fashion.  Preop timeout was performed to identify patient, procedure and site.  Initially marked the tibia for transection at a level that was equal to his BKA on the contralateral leg.  Then used a 2-0 silk tie to measure the circumferential distance of the calf at the level of planned transection.  We then cut the suture into two thirds and one thirds distance.  The two thirds distance was used to mark the anterior posterior length of the flap and then the one third suture was used to mark the length of the flap.  Ultimately a incision was then  performed with 10 blade scalpel.  We then used Bovie cautery and carried the incision through the subcutaneous tissue, fascia, and muscle.  The tibia was then circumferentially dissected and the fibula was circumferentially dissected.  The anterior tibial artery was identified and ligated between hemostats and ligated with a 2-0 silk suture.  The fibula was then cut with a hand bone cutter.  The tibia was then transected with an oscillating saw.  We completed the posterior flap with Bovie cautery and identified the peroneal and posterior tibial arteries that were then ligated between hemostats and 2-0 silk sutures.  All the tissue appeared healthy.  The wound was then copious irrigated with saline until all the effluent was clear.  There was no purulence or other gross infection in the BKA tissue itself.  The flap was then closed reapproximating the fascia using a 2-0 Vicryl in interrupted fashion until they were no more openings larger than a finger.  The skin was then reapproximated with staples.  Bacitracin ointment was applied to the wound.  Sterile dry dressing was applied.  He will be admitted for IV antibiotics and pain control.  Complications: None  Condition: Stable  [name], MD [facility] [place] Office: [phone] Pager: [place]

## 2018-11-17 NOTE — Anesthesia Preprocedure Evaluation (Addendum)
Anesthesia Evaluation  Patient identified by MRN, date of birth, ID band Patient awake    Reviewed: Allergy & Precautions, NPO status , Patient's Chart, lab work & pertinent test results  History of Anesthesia Complications (+) PONV  Airway Mallampati: II  TM Distance: >3 FB Neck ROM: Full    Dental no notable dental hx.    Pulmonary former smoker,    Pulmonary exam normal breath sounds clear to auscultation       Cardiovascular hypertension, Pt. on home beta blockers + CAD, + Past MI, + CABG, + Peripheral Vascular Disease and +CHF  Normal cardiovascular exam+ dysrhythmias Atrial Fibrillation  Rhythm:Regular Rate:Normal  ECHO: LV EF: 50% -   55%   Neuro/Psych PSYCHIATRIC DISORDERS Depression negative neurological ROS     GI/Hepatic Neg liver ROS, GERD  Controlled,  Endo/Other  diabetes, Insulin Dependent  Renal/GU Dialysis and ESRFRenal diseaseM, W, F     Musculoskeletal negative musculoskeletal ROS (+)   Abdominal   Peds  Hematology  (+) anemia ,   Anesthesia Other Findings Gangrenous Right Foot  Reproductive/Obstetrics                            Anesthesia Physical Anesthesia Plan  ASA: IV and emergent  Anesthesia Plan: General   Post-op Pain Management:    Induction: Intravenous and Rapid sequence  PONV Risk Score and Plan: 3 and Ondansetron, Dexamethasone and Treatment may vary due to age or medical condition  Airway Management Planned: Oral ETT  Additional Equipment:   Intra-op Plan:   Post-operative Plan: Extubation in OR  Informed Consent: I have reviewed the patients History and Physical, chart, labs and discussed the procedure including the risks, benefits and alternatives for the proposed anesthesia with the patient or authorized representative who has indicated his/her understanding and acceptance.     Dental advisory given  Plan Discussed with:  CRNA  Anesthesia Plan Comments:        Anesthesia Quick Evaluation

## 2018-11-17 NOTE — Progress Notes (Signed)
Pharmacy Antibiotic Note  [name] Reyez. is a 43 y.o. male with ESRD on HD MWF admitted on 11/17/2018 with necrotic right TMA.  He had a right BKA today. Of note, patient was receiving vancomycin PTA for 6 weeks for MRSA bacteremia - through 12/16/18 per last ID note.  Pharmacy has been consulted for vancomycin and Zosyn dosing. He had cefazolin 2 g IV at 15:35 and vancomycin 1000 mg IV at 16:00. Last vancomycin 1000 mg dose in HD was also today per med hx. Seems patient received 2000 mg IV vancomycin today post-HD.  Plan: Pre-HD vancomycin level prior to next HD session Zosyn 3.375 g IV q12h to be infused over 4 hours  F/U HD schedule  Height: 6\' 2"  (188 cm) Weight: 177 lb (80.3 kg) IBW/kg (Calculated) : 82.2  Temp (24hrs), Avg:98.6 F (37 C), Min:98 F (36.7 C), Max:99.1 F (37.3 C)  Recent Labs  Lab 11/17/18 1414  WBC 44.0*  CREATININE 5.43*    Estimated Creatinine Clearance: 20.3 mL/min (A) (by C-G formula based on SCr of 5.43 mg/dL (H)).    Allergies  Allergen Reactions  . Coconut Oil Anaphylaxis and Other (See Comments)    Can use topically, allergic to coconut foods     Antimicrobials this admission: Cefazolin 2 g x1 Vancomycin 3/20 >> (5/2) Zosyn 4/3 >>  Dose adjustments this admission: n/a  Microbiology results: No new results  Thank you for allowing pharmacy to be a part of this patient's care.  Renold Genta, PharmD, BCPS Clinical Pharmacist Clinical phone for 11/17/2018 until 10p is [phone] 11/17/2018 6:47 PM  **Pharmacist phone directory can now be found on [email or web address] listed under [place]**

## 2018-11-18 LAB — CBC
HCT: 24.4 % — ABNORMAL LOW (ref 39.0–52.0)
Hemoglobin: 7.9 g/dL — ABNORMAL LOW (ref 13.0–17.0)
MCH: 25.2 pg — ABNORMAL LOW (ref 26.0–34.0)
MCHC: 32.4 g/dL (ref 30.0–36.0)
MCV: 78 fL — ABNORMAL LOW (ref 80.0–100.0)
Platelets: 662 K/uL — ABNORMAL HIGH (ref 150–400)
RBC: 3.13 MIL/uL — ABNORMAL LOW (ref 4.22–5.81)
RDW: 18.5 % — ABNORMAL HIGH (ref 11.5–15.5)
WBC: 27.8 K/uL — ABNORMAL HIGH (ref 4.0–10.5)
nRBC: 0 % (ref 0.0–0.2)

## 2018-11-18 LAB — GLUCOSE, CAPILLARY
Glucose-Capillary: 241 mg/dL — ABNORMAL HIGH (ref 70–99)
Glucose-Capillary: 224 mg/dL — ABNORMAL HIGH (ref 70–99)
Glucose-Capillary: 202 mg/dL — ABNORMAL HIGH (ref 70–99)
Glucose-Capillary: 222 mg/dL — ABNORMAL HIGH (ref 70–99)
Glucose-Capillary: 245 mg/dL — ABNORMAL HIGH (ref 70–99)

## 2018-11-18 LAB — BASIC METABOLIC PANEL
Anion gap: 14 (ref 5–15)
BUN: 30 mg/dL — ABNORMAL HIGH (ref 6–20)
CO2: 22 mmol/L (ref 22–32)
Calcium: 8.8 mg/dL — ABNORMAL LOW (ref 8.9–10.3)
Chloride: 94 mmol/L — ABNORMAL LOW (ref 98–111)
Creatinine, Ser: 6.67 mg/dL — ABNORMAL HIGH (ref 0.61–1.24)
GFR calc Af Amer: 11 mL/min — ABNORMAL LOW (ref >60)
GFR calc non Af Amer: 9 mL/min — ABNORMAL LOW (ref >60)
Glucose, Bld: 223 mg/dL — ABNORMAL HIGH (ref 70–99)
Potassium: 3.2 mmol/L — ABNORMAL LOW (ref 3.5–5.1)
Sodium: 130 mmol/L — ABNORMAL LOW (ref 135–145)

## 2018-11-18 MED ORDER — INSULIN GLARGINE 100 UNIT/ML ~~LOC~~ SOLN
14.0000 [IU] | Freq: Every day | SUBCUTANEOUS | Status: DC
  Administered 2018-11-18 – 2018-11-22 (×5): 14 [IU] via SUBCUTANEOUS
  Filled 2018-11-18 (×6): qty 0.14

## 2018-11-18 NOTE — Progress Notes (Addendum)
Progress Note    11/18/2018 7:25 AM 1 Day Post-Op  Subjective:  Says he feels better; pain is less  Afebrile  Vitals:   11/17/18 2044 11/18/18 0449  BP: (!) 135/37 126/68  Pulse: 93 86  Resp: 14 19  Temp: 98.7 F (37.1 C) 98.7 F (37.1 C)  SpO2: 92% 100%    Physical Exam: Incisions:  Bandage in tact and is clean and dry.   Extremities:  He is able to straighten right knee   CBC    Component Value Date/Time   WBC 27.8 (H) 11/18/2018 0207   RBC 3.13 (L) 11/18/2018 0207   HGB 7.9 (L) 11/18/2018 0207   HGB 14.3 02/07/2018 0937   HCT 24.4 (L) 11/18/2018 0207   HCT 42.8 02/07/2018 0937   PLT 662 (H) 11/18/2018 0207   PLT 369 02/07/2018 0937   MCV 78.0 (L) 11/18/2018 0207   MCV 82 02/07/2018 0937   MCH 25.2 (L) 11/18/2018 0207   MCHC 32.4 11/18/2018 0207   RDW 18.5 (H) 11/18/2018 0207   RDW 21.5 (H) 02/07/2018 0937   LYMPHSABS 1.3 11/03/2018 0848   MONOABS 1.0 11/03/2018 0848   EOSABS 0.1 11/03/2018 0848   BASOSABS 0.0 11/03/2018 0848    BMET    Component Value Date/Time   NA 130 (L) 11/18/2018 0207   NA 133 (L) 02/07/2018 0937   K 3.2 (L) 11/18/2018 0207   CL 94 (L) 11/18/2018 0207   CO2 22 11/18/2018 0207   GLUCOSE 223 (H) 11/18/2018 0207   GLUCOSE 262 03/06/2008   BUN 30 (H) 11/18/2018 0207   BUN 37 (H) 02/07/2018 0937   CREATININE 6.67 (H) 11/18/2018 0207   CALCIUM 8.8 (L) 11/18/2018 0207   GFRNONAA 9 (L) 11/18/2018 0207   GFRAA 11 (L) 11/18/2018 0207    INR    Component Value Date/Time   INR 1.25 11/04/2017 1450     Intake/Output Summary (Last 24 hours) at 11/18/2018 0725 Last data filed at 11/18/2018 0500 Gross per 24 hour  Intake 1410 ml  Output 50 ml  Net 1360 ml     Assessment/Plan:  43 y.o. male is s/p right below knee amputation  1 Day Post-Op  -pt doing well this morning.  Bandage is clean and dry. Will remove tomorrow -leukocytosis improved from yesterday after BKA -hypokalemia-supplemented but K+ still 3.2.  Will defer to  renal for treatment. -acute blood loss anemia-hgb 7.9-pt tolerating.  Will not transfuse at this time.   -DM-pt takes Lantus 14U qhs, reordered 10U qhs as pt had surgery.  BS still elevated-will change to 14U qhs.  Continue SSI -renal failure-spoke with Dr. Melvia Heaps yesterday and renal will see pt today.    -social work consult ordered at time of surgery as it was thought pt lives in [place], however, upon Dr. Carlis Abbott speaking to pt's sister, he does reside at home.  Given he is double amputee now, will ask CIR to evaluate as pt is willing to go to inpatient rehab but not willing to return to SNF.     Leontine Locket, PA-C [facility] [phone] 11/18/2018 7:25 AM    I have seen and evaluated the patient. I agree with the PA note as documented above. POD#1 s/p R BKA for non-healing and infected TMA.  WBC down from 44 --> 27.  Remains on vanc/zosyn for broad spectrum coverage.  Dressing down tomorrow.  States he wants to go home, but would consider CIR.  [name], MD [facility] [place] Office: [phone] Pager: [phone]

## 2018-11-18 NOTE — Progress Notes (Signed)
Pt's potassium this AM noted to be 3.2, gave pt 40 meq of PO potassium per PRN order.

## 2018-11-18 NOTE — Progress Notes (Signed)
[facility] Progress Note  Subjective: no c/o, seen in room  Vitals:   11/17/18 1832 11/17/18 2044 11/18/18 0449 11/18/18 1133  BP: (!) 124/40 (!) 135/37 126/68 (!) 123/55  Pulse: 88 93 86 84  Resp: 17 14 19 16   Temp: 98.9 F (37.2 C) 98.7 F (37.1 C) 98.7 F (37.1 C) 98.6 F (37 C)  TempSrc: Oral Oral Oral Oral  SpO2: 96% 92% 100% 100%  Weight:      Height:        Inpatient medications: . aspirin EC  81 mg Oral Daily  . atorvastatin  80 mg Oral q1800  . [START ON 11/20/2018] calcitRIOL  1.25 mcg Oral Q M,W,F-HD  . carvedilol  6.25 mg Oral BID  . clopidogrel  75 mg Oral Daily  . docusate sodium  100 mg Oral Daily  . heparin  5,000 Units Subcutaneous Q8H  . insulin aspart  0-15 Units Subcutaneous TID WC  . insulin glargine  14 Units Subcutaneous QHS  . isosorbide mononitrate  30 mg Oral Daily  . lanthanum  2,000 mg Oral TID WC  . multivitamin  1 tablet Oral QHS  . pantoprazole  40 mg Oral Daily  . sodium chloride flush  3 mL Intravenous Q12H   . sodium chloride    . magnesium sulfate 1 - 4 g bolus IVPB    . piperacillin-tazobactam (ZOSYN)  IV 3.375 g (11/18/18 0901)   sodium chloride, acetaminophen **OR** acetaminophen, bisacodyl, guaiFENesin-dextromethorphan, hydrALAZINE, labetalol, magnesium sulfate 1 - 4 g bolus IVPB, metoprolol tartrate, morphine, morphine injection, ondansetron, phenol, polyethylene glycol, sodium chloride flush    Exam: General:Chronically ill appearing male in NAD  Heart:S1,S2 Tachy-ST on monitor rate 110-118 Chest: bilat clear, no wheezing Abdomen:Soft, NT Ext: bilat BKA trace edema Dialysis Access:L AVF + bruit  [facility] MWF 4 hrs   500/800  77.5kg   2K/3.5Ca bath   P2  Hep 2500  L AVF  - darbe 60 ug weekly, last 4/1  - venofer 100 mg thru 4/10  - vit D 1.25 ug tiw   Assessment/Plan: 1. 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 ESRD on HD: usual HD MWF. Had full dialysis yesterday morning. Next HD Monday.   3. HTN/ volume - up 3kg from prior dry. UF 3-4kg on HD Monday. Stable on exam.  4. Anemia ckd - Hb down 7.9 after surgery. Follow. Darbe due 4/8 if here. IV Fe load will be held w/ infection/ Duncan Dull. Transfuse prn 5.  MBD ckd - cont meds 6. H/o syst CHF 7. CAD H/O CABG X 3    [facility] 11/18/2018, 3:53 PM  Iron/TIBC/Ferritin/ %Sat    Component Value Date/Time   IRON 27 (L) 03/16/2017 1142   TIBC 188 (L) 03/16/2017 1142   FERRITIN 164 03/16/2017 1142   IRONPCTSAT 14 (L) 03/16/2017 1142   Recent Labs  Lab 11/18/18 0207  NA 130*  K 3.2*  CL 94*  CO2 22  GLUCOSE 223*  BUN 30*  CREATININE 6.67*  CALCIUM 8.8*   No results for input(s): AST, ALT, ALKPHOS, BILITOT, PROT in the last 168 hours. Recent Labs  Lab 11/18/18 0207  WBC 27.8*  HGB 7.9*  HCT 24.4*  PLT 662*

## 2018-11-18 NOTE — Evaluation (Signed)
Occupational Therapy Evaluation Patient Details Name: [name] Zappia Sr. MRN: [id number] DOB: 04-26-1976 Today's Date: 11/18/2018    History of Present Illness 43 yo male presenting with purulence draining from right foot with significant pain. Recent admission with sepsis secondary to foot infection. 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 PMH including left BKA with prosthetic, CAD, depression, ESRD with HD WMF, GERD, HTN, MI, and DM type ll.     Clinical Impression   PTA, pt was living with his girlfriend and reports he was Mod I for ADLs; has an aide who assists with IADLs and uses transportation to/from dialysis. Pt mainly using w/c for functional mobility and has left prosthetic. Pt currently requiring close Min Guard A for UB ADLs, Mod A +2 for LB ADLs, and Min-Mod A +2 for functional transfers. Pt presenting with high motivated to participate in therapy despite significant pain. Educating pt on resting RLE in extension and techniques to decrease phantom pain. Pt would benefit from further acute OT to facilitate safe dc. Recommend dc to CIR for further OT to optimize safety, independence with ADLs, and return to PLOF.      Follow Up Recommendations  CIR;Supervision/Assistance - 24 hour    Equipment Recommendations  Other (comment)(Defer to next venue)    Recommendations for Other Services PT consult     Precautions / Restrictions Precautions Precautions: Fall Precaution Comments: Prior Left BKA; left prosthetic. Recent R BKA.  Restrictions Weight Bearing Restrictions: Yes RLE Weight Bearing: Non weight bearing(s/p amp)      Mobility Bed Mobility Overal bed mobility: Needs Assistance Bed Mobility: Supine to Sit     Supine to sit: Min assist;HOB elevated;Min guard     General bed mobility comments: Min A for trunk support at times  Transfers Overall transfer level: Needs assistance Equipment used: Rolling walker (2 wheeled) Transfers: Sit to/from Stand;Lateral/Scoot  Transfers Sit to Stand: Mod assist;+2 physical assistance;From elevated surface        Lateral/Scoot Transfers: Min assist;+2 safety/equipment General transfer comment: Mod A +2 to power up from elevated surface. Pt requiring Min A for safety and balance during lateral scoot. Pt wearing his prosthetic during transfers to increased balance    Balance Overall balance assessment: Needs assistance Sitting-balance support: No upper extremity supported;Feet supported Sitting balance-Leahy Scale: Fair Sitting balance - Comments: Able to maintain static sitting balance    Standing balance support: Bilateral upper extremity supported;During functional activity Standing balance-Leahy Scale: Poor Standing balance comment: Reliant on UE support                           ADL either performed or assessed with clinical judgement   ADL Overall ADL's : Needs assistance/impaired Eating/Feeding: Independent;Sitting   Grooming: Set up;Supervision/safety;Sitting   Upper Body Bathing: Min guard;Sitting   Lower Body Bathing: Moderate assistance;+2 for physical assistance;Sit to/from stand   Upper Body Dressing : Min guard;Sitting Upper Body Dressing Details (indicate cue type and reason): Min Guard for safety with sitting balance Lower Body Dressing: Moderate assistance;+2 for physical assistance;Sit to/from stand Lower Body Dressing Details (indicate cue type and reason): Pt donning his prosthetic on LLE and MIn Guard A for safety with sitting balance. Mod A +2 for sit<>stand Toilet Transfer: Minimal assistance;+2 for safety/equipment(Lateral scoot to drop arm recliner)           Functional mobility during ADLs: Moderate assistance;+2 for physical assistance;Rolling walker;+2 for safety/equipment(sit<>stand Mod A +2; lateral scoot with Min A) General ADL  Comments: Pt demonstrating increased balance and activity tolerance. Motivated to participate in therpay despite pain.      Vision          Perception     Praxis      Pertinent Vitals/Pain Pain Assessment: 0-10 Pain Score: 8  Pain Location: R foot Pain Descriptors / Indicators: Constant;Sharp;Grimacing Pain Intervention(s): Monitored during session;Limited activity within patient's tolerance;Repositioned     Hand Dominance Right   Extremity/Trunk Assessment Upper Extremity Assessment Upper Extremity Assessment: Overall WFL for tasks assessed   Lower Extremity Assessment Lower Extremity Assessment: Defer to PT evaluation RLE Deficits / Details: s/p R BKA RLE: Unable to fully assess due to pain LLE Deficits / Details: Left BKA   Cervical / Trunk Assessment Cervical / Trunk Assessment: Normal   Communication Communication Communication: No difficulties   Cognition Arousal/Alertness: Awake/alert Behavior During Therapy: Flat affect Overall Cognitive Status: Within Functional Limits for tasks assessed                                     General Comments  VSS    Exercises     Shoulder Instructions      Home Living Family/patient expects to be discharged to:: Private residence Living Arrangements: Spouse/significant other(Girlfriend staying with him) Available Help at Discharge: Personal care attendant;Available 24 hours/day;Friend(s);Available PRN/intermittently Type of Home: Apartment Home Access: Level entry     Home Layout: One level     Bathroom Shower/Tub: Teacher, early years/pre: Handicapped height     Home Equipment: Grab bars - toilet;Grab bars - tub/shower;Wheelchair - [employer] - 2 wheels;Crutches;Tub bench;Cane - single point   Additional Comments: Wheelchair accessible apartment      Prior Functioning/Environment Level of Independence: Needs assistance  Gait / Transfers Assistance Needed: Primarily using w/c for mobility due to R foot pain and "they told me not to be on my foot so it can heal." Mod indep with w/c transfers. Transport to  HD ADL's / Homemaking Assistance Needed: PCA 5 day/wk for 5 hrs/day to assist with household tasks (cooking, cleaning). Pt report mod indep with ADLs            OT Problem List: Decreased strength;Decreased range of motion;Decreased activity tolerance;Impaired balance (sitting and/or standing);Decreased knowledge of use of DME or AE;Decreased knowledge of precautions;Pain      OT Treatment/Interventions: Self-care/ADL training;Therapeutic exercise;Energy conservation;DME and/or AE instruction;Therapeutic activities;Patient/family education    OT Goals(Current goals can be found in the care plan section) Acute Rehab OT Goals Patient Stated Goal: "Get stronger" OT Goal Formulation: With patient Time For Goal Achievement: 12/02/18 Potential to Achieve Goals: Good  OT Frequency: Min 3X/week   Barriers to D/C:            Co-evaluation PT/OT/SLP Co-Evaluation/Treatment: Yes Reason for Co-Treatment: For patient/therapist safety;To address functional/ADL transfers   OT goals addressed during session: ADL's and self-care      AM-PAC OT "6 Clicks" Daily Activity     Outcome Measure Help from another person eating meals?: None Help from another person taking care of personal grooming?: A Little Help from another person toileting, which includes using toliet, bedpan, or urinal?: A Little Help from another person bathing (including washing, rinsing, drying)?: A Lot Help from another person to put on and taking off regular upper body clothing?: A Little Help from another person to put on and taking off regular lower body clothing?: A  Lot 6 Click Score: 17   End of Session Equipment Utilized During Treatment: Rolling walker;Other (comment)(Prosthetic) Nurse Communication: Mobility status;Weight bearing status  Activity Tolerance: Patient tolerated treatment well;Patient limited by pain Patient left: in chair;with call bell/phone within reach  OT Visit Diagnosis: Unsteadiness on feet  (R26.81);Other abnormalities of gait and mobility (R26.89);Muscle weakness (generalized) (M62.81);Pain Pain - Right/Left: Right Pain - part of body: Leg                Time: 6756-1254 OT Time Calculation (min): 19 min Charges:  OT General Charges $OT Visit: 1 Visit OT Evaluation $OT Eval Moderate Complexity: [place], OTR/L Acute Rehab Pager: [phone] Office: [place] 11/18/2018, 10:48 AM

## 2018-11-18 NOTE — Evaluation (Signed)
Physical Therapy Evaluation Patient Details Name: [name] Gustafson Sr. MRN: [id number] DOB: 1975-11-17 Today's Date: 11/18/2018   History of Present Illness  43 yo male presenting with purulence draining from right foot with significant pain. Recent admission with sepsis secondary to foot infection. 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 PMH including left BKA with prosthetic, CAD, depression, ESRD with HD WMF, GERD, HTN, MI, and DM type ll.   Clinical Impression  Orders received for PT evaluation. Patient demonstrates deficits in functional mobility as indicated below. Will benefit from continued skilled PT to address deficits and maximize function. Will see as indicated and progress as tolerated.  Given prior level of function and new conditions, feel patient would be a great candidate for intensive rehabilitation and training to ensure safety with mobility and to give patient ability to maximize recovery of function. Feel CIR would be most appropriate venue to progress patient and educate on new conditions of life with bilateral amputations. Patient receptive.    Follow Up Recommendations CIR    Equipment Recommendations  (TBD)    Recommendations for Other Services Rehab consult     Precautions / Restrictions Precautions Precautions: Fall Precaution Comments: Prior Left BKA; left prosthetic. Recent R BKA.  Restrictions Weight Bearing Restrictions: Yes RLE Weight Bearing: Non weight bearing(s/p amp)      Mobility  Bed Mobility Overal bed mobility: Needs Assistance Bed Mobility: Supine to Sit     Supine to sit: Min assist;HOB elevated;Min guard     General bed mobility comments: Min A for trunk support at times  Transfers Overall transfer level: Needs assistance Equipment used: Rolling walker (2 wheeled) Transfers: Sit to/from Stand;Lateral/Scoot Transfers Sit to Stand: Mod assist;+2 physical assistance;From elevated surface        Lateral/Scoot Transfers: Min assist;+2  safety/equipment General transfer comment: Mod A +2 to power up from elevated surface. Pt requiring Min A for safety and balance during lateral scoot. Pt wearing his prosthetic during transfers to increased balance  Ambulation/Gait                Stairs            Wheelchair Mobility    Modified Rankin (Stroke Patients Only)       Balance Overall balance assessment: Needs assistance Sitting-balance support: No upper extremity supported;Feet supported Sitting balance-Leahy Scale: Fair Sitting balance - Comments: Able to maintain static sitting balance    Standing balance support: Bilateral upper extremity supported;During functional activity Standing balance-Leahy Scale: Poor Standing balance comment: Reliant on UE support                             Pertinent Vitals/Pain Pain Assessment: 0-10 Pain Score: 8  Pain Location: R foot Pain Descriptors / Indicators: Constant;Sharp;Grimacing Pain Intervention(s): Monitored during session;Limited activity within patient's tolerance;Repositioned    Home Living Family/patient expects to be discharged to:: Private residence Living Arrangements: Spouse/significant other(Girlfriend staying with him) Available Help at Discharge: Personal care attendant;Available 24 hours/day;Friend(s);Available PRN/intermittently Type of Home: Apartment Home Access: Level entry     Home Layout: One level Home Equipment: Grab bars - toilet;Grab bars - tub/shower;Wheelchair - [employer] - 2 wheels;Crutches;Tub bench;Cane - single point Additional Comments: Wheelchair accessible apartment    Prior Function Level of Independence: Needs assistance   Gait / Transfers Assistance Needed: Primarily using w/c for mobility due to R foot pain and "they told me not to be on my foot so it can heal." Mod  indep with w/c transfers. Transport to HD  ADL's / Homemaking Assistance Needed: PCA 5 day/wk for 5 hrs/day to assist with household  tasks (cooking, cleaning). Pt report mod indep with ADLs        Hand Dominance   Dominant Hand: Right    Extremity/Trunk Assessment   Upper Extremity Assessment Upper Extremity Assessment: Overall WFL for tasks assessed    Lower Extremity Assessment Lower Extremity Assessment: Generalized weakness RLE Deficits / Details: s/p R BKA RLE: Unable to fully assess due to pain LLE Deficits / Details: Left BKA    Cervical / Trunk Assessment Cervical / Trunk Assessment: Normal  Communication   Communication: No difficulties  Cognition Arousal/Alertness: Awake/alert Behavior During Therapy: Flat affect Overall Cognitive Status: Within Functional Limits for tasks assessed                                        General Comments General comments (skin integrity, edema, etc.): VSS    Exercises Other Exercises Other Exercises: encouraged desesitization activities and terminal knee extension positioning for R residual limb   Assessment/Plan    PT Assessment Patient needs continued PT services  PT Problem List Decreased range of motion;Decreased activity tolerance;Decreased balance;Decreased mobility       PT Treatment Interventions DME instruction;Functional mobility training;Therapeutic activities;Therapeutic exercise;Patient/family education;Balance training;Wheelchair mobility training    PT Goals (Current goals can be found in the Care Plan section)  Acute Rehab PT Goals Patient Stated Goal: "Get stronger" PT Goal Formulation: With patient Time For Goal Achievement: 12/02/18 Potential to Achieve Goals: Good    Frequency Min 3X/week   Barriers to discharge        Co-evaluation PT/OT/SLP Co-Evaluation/Treatment: Yes Reason for Co-Treatment: For patient/therapist safety PT goals addressed during session: Mobility/safety with mobility OT goals addressed during session: ADL's and self-care       AM-PAC PT "6 Clicks" Mobility  Outcome Measure Help  needed turning from your back to your side while in a flat bed without using bedrails?: A Little Help needed moving from lying on your back to sitting on the side of a flat bed without using bedrails?: A Little Help needed moving to and from a bed to a chair (including a wheelchair)?: A Little Help needed standing up from a chair using your arms (e.g., wheelchair or bedside chair)?: A Lot Help needed to walk in hospital room?: A Lot Help needed climbing 3-5 steps with a railing? : Total 6 Click Score: 14    End of Session   Activity Tolerance: Patient tolerated treatment well Patient left: in chair;with call bell/phone within reach Nurse Communication: Mobility status PT Visit Diagnosis: Other abnormalities of gait and mobility (R26.89);Pain Pain - Right/Left: Right Pain - part of body: Ankle and joints of foot    Time:  -      Charges:   PT Evaluation $PT Eval Moderate Complexity: 1 Mod          Alben Deeds, PT DPT  Board Certified Neurologic Specialist Acute Rehabilitation Services Pager [phone] Office [place] 11/18/2018, 11:32 AM

## 2018-11-19 ENCOUNTER — Encounter (HOSPITAL_COMMUNITY): Payer: Self-pay | Admitting: Vascular Surgery

## 2018-11-19 LAB — CBC
HCT: 25.9 % — ABNORMAL LOW (ref 39.0–52.0)
Hemoglobin: 8.4 g/dL — ABNORMAL LOW (ref 13.0–17.0)
MCH: 25.1 pg — ABNORMAL LOW (ref 26.0–34.0)
MCHC: 32.4 g/dL (ref 30.0–36.0)
MCV: 77.5 fL — ABNORMAL LOW (ref 80.0–100.0)
Platelets: 733 10*3/uL — ABNORMAL HIGH (ref 150–400)
RBC: 3.34 MIL/uL — ABNORMAL LOW (ref 4.22–5.81)
RDW: 18.8 % — ABNORMAL HIGH (ref 11.5–15.5)
WBC: 20 10*3/uL — ABNORMAL HIGH (ref 4.0–10.5)
nRBC: 0 % (ref 0.0–0.2)

## 2018-11-19 LAB — BASIC METABOLIC PANEL
Anion gap: 13 (ref 5–15)
BUN: 48 mg/dL — ABNORMAL HIGH (ref 6–20)
CO2: 22 mmol/L (ref 22–32)
Calcium: 8.1 mg/dL — ABNORMAL LOW (ref 8.9–10.3)
Chloride: 92 mmol/L — ABNORMAL LOW (ref 98–111)
Creatinine, Ser: 8.63 mg/dL — ABNORMAL HIGH (ref 0.61–1.24)
GFR calc Af Amer: 8 mL/min — ABNORMAL LOW (ref >60)
GFR calc non Af Amer: 7 mL/min — ABNORMAL LOW (ref >60)
Glucose, Bld: 317 mg/dL — ABNORMAL HIGH (ref 70–99)
Potassium: 3.4 mmol/L — ABNORMAL LOW (ref 3.5–5.1)
Sodium: 127 mmol/L — ABNORMAL LOW (ref 135–145)

## 2018-11-19 LAB — GLUCOSE, CAPILLARY
Glucose-Capillary: 339 mg/dL — ABNORMAL HIGH (ref 70–99)
Glucose-Capillary: 173 mg/dL — ABNORMAL HIGH (ref 70–99)
Glucose-Capillary: 182 mg/dL — ABNORMAL HIGH (ref 70–99)
Glucose-Capillary: 291 mg/dL — ABNORMAL HIGH (ref 70–99)

## 2018-11-19 MED ORDER — CHLORHEXIDINE GLUCONATE CLOTH 2 % EX PADS
6.0000 | MEDICATED_PAD | Freq: Every day | CUTANEOUS | Status: DC
  Administered 2018-11-19: 6 via TOPICAL

## 2018-11-19 NOTE — Progress Notes (Signed)
[facility] Progress Note  Subjective: no c/o seen in room  Vitals:   11/18/18 1703 11/18/18 1930 11/19/18 0604 11/19/18 0819  BP: (!) 143/90 137/62 126/79 114/70  Pulse: 92 92 80 79  Resp: 16 16 16 16   Temp: 99.7 F (37.6 C) 99 F (37.2 C) 98.6 F (37 C) 98.4 F (36.9 C)  TempSrc: Oral Oral Oral Oral  SpO2: 100% 96% 100% 99%  Weight:      Height:        Inpatient medications: . aspirin EC  81 mg Oral Daily  . atorvastatin  80 mg Oral q1800  . [START ON 11/20/2018] calcitRIOL  1.25 mcg Oral Q M,W,F-HD  . carvedilol  6.25 mg Oral BID  . clopidogrel  75 mg Oral Daily  . docusate sodium  100 mg Oral Daily  . heparin  5,000 Units Subcutaneous Q8H  . insulin aspart  0-15 Units Subcutaneous TID WC  . insulin glargine  14 Units Subcutaneous QHS  . isosorbide mononitrate  30 mg Oral Daily  . lanthanum  2,000 mg Oral TID WC  . multivitamin  1 tablet Oral QHS  . pantoprazole  40 mg Oral Daily  . sodium chloride flush  3 mL Intravenous Q12H   . sodium chloride    . magnesium sulfate 1 - 4 g bolus IVPB    . piperacillin-tazobactam (ZOSYN)  IV 3.375 g (11/19/18 0856)   sodium chloride, acetaminophen **OR** acetaminophen, bisacodyl, guaiFENesin-dextromethorphan, hydrALAZINE, labetalol, magnesium sulfate 1 - 4 g bolus IVPB, metoprolol tartrate, morphine, morphine injection, ondansetron, phenol, polyethylene glycol, sodium chloride flush    Exam: General:Chronically ill appearing male in NAD  Heart:S1,S2 Tachy-ST on monitor rate 110-118 Chest: bilat clear, no wheezing Abdomen:Soft, NT Ext: bilat BKA trace edema, wounds intact R stump/ staples Dialysis Access:L AVF + bruit  [facility] MWF 4 hrs   500/800  77.5kg   2K/3.5Ca bath   P2  Hep 2500  L AVF  - darbe 60 ug weekly, last 4/1  - venofer 100 mg thru 4/10  - vit D 1.25 ug tiw   Assessment/Plan: 1. 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 ESRD on HD: HD MWF. Last HD outpt unit Friday am. Next HD  Monday.  3. HTN/ volume - up 3kg from prior dry. UF 3-4kg on HD Monday. Stable on exam.  4. Anemia ckd - Hb down 7.9 after surgery. Follow. Darbe due 4/8 if here. IV Fe load will be held w/ infection/ Duncan Dull. Transfuse prn 5.  MBD ckd - cont meds 6. H/o syst CHF 7. CAD H/O CABG X 3    [facility] 11/19/2018, 10:43 AM  Iron/TIBC/Ferritin/ %Sat    Component Value Date/Time   IRON 27 (L) 03/16/2017 1142   TIBC 188 (L) 03/16/2017 1142   FERRITIN 164 03/16/2017 1142   IRONPCTSAT 14 (L) 03/16/2017 1142   Recent Labs  Lab 11/19/18 0416  NA 127*  K 3.4*  CL 92*  CO2 22  GLUCOSE 317*  BUN 48*  CREATININE 8.63*  CALCIUM 8.1*   No results for input(s): AST, ALT, ALKPHOS, BILITOT, PROT in the last 168 hours. Recent Labs  Lab 11/19/18 0416  WBC 20.0*  HGB 8.4*  HCT 25.9*  PLT 733*

## 2018-11-19 NOTE — Progress Notes (Addendum)
  Progress Note    11/19/2018 8:36 AM   Subjective:  Having some pain in BKA stump.  Afebrile  Vitals:   11/19/18 0604 11/19/18 0819  BP: 126/79 114/70  Pulse: 80 79  Resp: 16 16  Temp: 98.6 F (37 C) 98.4 F (36.9 C)  SpO2: 100% 99%    Physical Exam: R BKA c/d/i - staples - no drainage - looks good   CBC    Component Value Date/Time   WBC 20.0 (H) 11/19/2018 0416   RBC 3.34 (L) 11/19/2018 0416   HGB 8.4 (L) 11/19/2018 0416   HGB 14.3 02/07/2018 0937   HCT 25.9 (L) 11/19/2018 0416   HCT 42.8 02/07/2018 0937   PLT 733 (H) 11/19/2018 0416   PLT 369 02/07/2018 0937   MCV 77.5 (L) 11/19/2018 0416   MCV 82 02/07/2018 0937   MCH 25.1 (L) 11/19/2018 0416   MCHC 32.4 11/19/2018 0416   RDW 18.8 (H) 11/19/2018 0416   RDW 21.5 (H) 02/07/2018 0937   LYMPHSABS 1.3 11/03/2018 0848   MONOABS 1.0 11/03/2018 0848   EOSABS 0.1 11/03/2018 0848   BASOSABS 0.0 11/03/2018 0848    BMET    Component Value Date/Time   NA 127 (L) 11/19/2018 0416   NA 133 (L) 02/07/2018 0937   K 3.4 (L) 11/19/2018 0416   CL 92 (L) 11/19/2018 0416   CO2 22 11/19/2018 0416   GLUCOSE 317 (H) 11/19/2018 0416   GLUCOSE 262 03/06/2008   BUN 48 (H) 11/19/2018 0416   BUN 37 (H) 02/07/2018 0937   CREATININE 8.63 (H) 11/19/2018 0416   CALCIUM 8.1 (L) 11/19/2018 0416   GFRNONAA 7 (L) 11/19/2018 0416   GFRAA 8 (L) 11/19/2018 0416    INR    Component Value Date/Time   INR 1.25 11/04/2017 1450     Intake/Output Summary (Last 24 hours) at 11/19/2018 0836 Last data filed at 11/19/2018 0000 Gross per 24 hour  Intake 203 ml  Output -  Net 203 ml     Assessment/Plan:  43 y.o. male is s/p right below knee amputation   -BKA looks good -leukocytosis improving - WBC down to 20 -Hgb 8.4 -Patient amendable to CIR or home, consulted CIR yesterday per PT/OT recommendations   [name], MD [facility] Office: [phone] Pager: [place]

## 2018-11-20 ENCOUNTER — Ambulatory Visit: Payer: Self-pay | Admitting: Family

## 2018-11-20 LAB — RENAL FUNCTION PANEL
Albumin: 1.7 g/dL — ABNORMAL LOW (ref 3.5–5.0)
Anion gap: 15 (ref 5–15)
BUN: 62 mg/dL — ABNORMAL HIGH (ref 6–20)
CO2: 20 mmol/L — ABNORMAL LOW (ref 22–32)
Calcium: 7.7 mg/dL — ABNORMAL LOW (ref 8.9–10.3)
Chloride: 92 mmol/L — ABNORMAL LOW (ref 98–111)
Creatinine, Ser: 10.55 mg/dL — ABNORMAL HIGH (ref 0.61–1.24)
GFR calc Af Amer: 6 mL/min — ABNORMAL LOW (ref >60)
GFR calc non Af Amer: 5 mL/min — ABNORMAL LOW (ref >60)
Glucose, Bld: 244 mg/dL — ABNORMAL HIGH (ref 70–99)
Phosphorus: 3.7 mg/dL (ref 2.5–4.6)
Potassium: 3.7 mmol/L (ref 3.5–5.1)
Sodium: 127 mmol/L — ABNORMAL LOW (ref 135–145)

## 2018-11-20 LAB — GLUCOSE, CAPILLARY
Glucose-Capillary: 268 mg/dL — ABNORMAL HIGH (ref 70–99)
Glucose-Capillary: 158 mg/dL — ABNORMAL HIGH (ref 70–99)
Glucose-Capillary: 224 mg/dL — ABNORMAL HIGH (ref 70–99)
Glucose-Capillary: 175 mg/dL — ABNORMAL HIGH (ref 70–99)
Glucose-Capillary: 319 mg/dL — ABNORMAL HIGH (ref 70–99)

## 2018-11-20 LAB — CBC
HCT: 24 % — ABNORMAL LOW (ref 39.0–52.0)
Hemoglobin: 7.5 g/dL — ABNORMAL LOW (ref 13.0–17.0)
MCH: 25.1 pg — ABNORMAL LOW (ref 26.0–34.0)
MCHC: 31.3 g/dL (ref 30.0–36.0)
MCV: 80.3 fL (ref 80.0–100.0)
Platelets: 754 10*3/uL — ABNORMAL HIGH (ref 150–400)
RBC: 2.99 MIL/uL — ABNORMAL LOW (ref 4.22–5.81)
RDW: 19 % — ABNORMAL HIGH (ref 11.5–15.5)
WBC: 14.9 10*3/uL — ABNORMAL HIGH (ref 4.0–10.5)
nRBC: 0 % (ref 0.0–0.2)

## 2018-11-20 LAB — VANCOMYCIN, RANDOM: Vancomycin Rm: 29

## 2018-11-20 MED ORDER — PENTAFLUOROPROP-TETRAFLUOROETH EX AERO: 1.0000 "application " | INHALATION_SPRAY | CUTANEOUS | Status: DC | PRN

## 2018-11-20 MED ORDER — LIDOCAINE-PRILOCAINE 2.5-2.5 % EX CREA: 1.0000 "application " | TOPICAL_CREAM | CUTANEOUS | Status: DC | PRN

## 2018-11-20 MED ORDER — HEPARIN SODIUM (PORCINE) 1000 UNIT/ML DIALYSIS
2500.0000 [IU] | Freq: Once | INTRAMUSCULAR | Status: AC
  Administered 2018-11-20: 2500 [IU] via INTRAVENOUS_CENTRAL

## 2018-11-20 MED ORDER — CALCITRIOL 0.25 MCG PO CAPS
ORAL_CAPSULE | ORAL | Status: AC
  Filled 2018-11-20: qty 1

## 2018-11-20 MED ORDER — ALTEPLASE 2 MG IJ SOLR: 2.0000 mg | Freq: Once | INTRAMUSCULAR | Status: DC | PRN

## 2018-11-20 MED ORDER — MORPHINE SULFATE (PF) 2 MG/ML IV SOLN
INTRAVENOUS | Status: AC
  Filled 2018-11-20: qty 1

## 2018-11-20 MED ORDER — HEPARIN SODIUM (PORCINE) 1000 UNIT/ML IJ SOLN
INTRAMUSCULAR | Status: AC
  Administered 2018-11-20: 2500 [IU] via INTRAVENOUS_CENTRAL
  Filled 2018-11-20: qty 3

## 2018-11-20 MED ORDER — VANCOMYCIN VARIABLE DOSE PER UNSTABLE RENAL FUNCTION (PHARMACIST DOSING): Status: DC

## 2018-11-20 MED ORDER — SODIUM CHLORIDE 0.9 % IV SOLN: 100.0000 mL | INTRAVENOUS | Status: DC | PRN

## 2018-11-20 MED ORDER — CALCITRIOL 0.5 MCG PO CAPS
ORAL_CAPSULE | ORAL | Status: AC
  Filled 2018-11-20: qty 2

## 2018-11-20 MED ORDER — LIDOCAINE HCL (PF) 1 % IJ SOLN: 5.0000 mL | INTRAMUSCULAR | Status: DC | PRN

## 2018-11-20 MED ORDER — HEPARIN SODIUM (PORCINE) 1000 UNIT/ML DIALYSIS: 1000.0000 [IU] | INTRAMUSCULAR | Status: DC | PRN

## 2018-11-20 MED ORDER — HEPARIN SODIUM (PORCINE) 1000 UNIT/ML DIALYSIS: 20.0000 [IU]/kg | INTRAMUSCULAR | Status: DC | PRN

## 2018-11-20 NOTE — Progress Notes (Signed)
Pt's right arm has been swollen since last night, elevated in pillow. Denies any pain but stated he could feel the tightness. Pt has quarter size notch palpable in right forearm. radial pulse palpable. Pt denies any SOB . Will pass on to oncoming shift.

## 2018-11-20 NOTE — Progress Notes (Signed)
  Progress Note    11/20/2018 1:20 PM   Subjective:  Having some ongoing pain in right BKA stump.  Seen in dialysis.    Vitals:   11/20/18 1100 11/20/18 1130  BP: (!) 131/51 (!) 151/62  Pulse: 83 94  Resp:  19  Temp:  98.4 F (36.9 C)  SpO2:  100%    Physical Exam: R BKA c/d/i - staples - no drainage - looks good   CBC    Component Value Date/Time   WBC 14.9 (H) 11/20/2018 0729   RBC 2.99 (L) 11/20/2018 0729   HGB 7.5 (L) 11/20/2018 0729   HGB 14.3 02/07/2018 0937   HCT 24.0 (L) 11/20/2018 0729   HCT 42.8 02/07/2018 0937   PLT 754 (H) 11/20/2018 0729   PLT 369 02/07/2018 0937   MCV 80.3 11/20/2018 0729   MCV 82 02/07/2018 0937   MCH 25.1 (L) 11/20/2018 0729   MCHC 31.3 11/20/2018 0729   RDW 19.0 (H) 11/20/2018 0729   RDW 21.5 (H) 02/07/2018 0937   LYMPHSABS 1.3 11/03/2018 0848   MONOABS 1.0 11/03/2018 0848   EOSABS 0.1 11/03/2018 0848   BASOSABS 0.0 11/03/2018 0848    BMET    Component Value Date/Time   NA 127 (L) 11/20/2018 0730   NA 133 (L) 02/07/2018 0937   K 3.7 11/20/2018 0730   CL 92 (L) 11/20/2018 0730   CO2 20 (L) 11/20/2018 0730   GLUCOSE 244 (H) 11/20/2018 0730   GLUCOSE 262 03/06/2008   BUN 62 (H) 11/20/2018 0730   BUN 37 (H) 02/07/2018 0937   CREATININE 10.55 (H) 11/20/2018 0730   CALCIUM 7.7 (L) 11/20/2018 0730   GFRNONAA 5 (L) 11/20/2018 0730   GFRAA 6 (L) 11/20/2018 0730    INR    Component Value Date/Time   INR 1.25 11/04/2017 1450     Intake/Output Summary (Last 24 hours) at 11/20/2018 1320 Last data filed at 11/20/2018 1228 Gross per 24 hour  Intake 523.04 ml  Output 4000 ml  Net -3476.96 ml     Assessment/Plan:  43 y.o. male is s/p right below knee amputation   -BKA looks good -leukocytosis improving - WBC down to 14 now (44 on admission) -Hgb 7.5 -Patient amendable to CIR or home, consulted CIR yesterday per PT/OT recommendations.  Unfortunately just found out girlfriend intubated in ICU as r/o Covid.  We have  notified ID and do not need to send Covid test on Mr. Dermody since he is asymptomatic, they have advised we place patient on precautions, and patient needs a mask.  If girlfriend positive we will renotify ID.     [name], MD [facility] Office: [phone] Pager: [place]

## 2018-11-20 NOTE — Progress Notes (Signed)
Inpatient Rehabilitation Admissions Coordinator  Inpatient Rehab Consult received. I met with Patient at the bedside for rehabilitation assessment. I asked who does he live with and he states his girlfriend, Bartholome Bill who is admitted to the hospital and is currently intubated and is being testing for the virus.  RN at bedside and aware of discussion concerning his girlfriend. I excused myself from the room. I will await COVID testing and follow up.  Danne Baxter, RN, MSN Rehab Admissions Coordinator [phone] 11/20/2018 12:40 PM

## 2018-11-20 NOTE — Progress Notes (Signed)
Inpatient Diabetes Program Recommendations  AACE/ADA: New Consensus Statement on Inpatient Glycemic Control (2015)  Target Ranges:  Prepandial:   less than 140 mg/dL      Peak postprandial:   less than 180 mg/dL (1-2 hours)      Critically ill patients:  140 - 180 mg/dL   Results for [name], [name] LEE SR. (MRN [id number]) as of 11/20/2018 13:36  Ref. Range 11/19/2018 06:11 11/19/2018 11:05 11/19/2018 16:27 11/19/2018 21:17 11/20/2018 06:19 11/20/2018 08:18 11/20/2018 11:55  Glucose-Capillary Latest Ref Range: 70 - 99 mg/dL 339 (H) 173 (H) 182 (H) 291 (H) 268 (H) 158 (H) 224 (H)   Review of Glycemic Control  Diabetes history: DM 2 Outpatient Diabetes medications: Latus 14 units qhs Current orders for Inpatient glycemic control: Lantus 14 units qhs, Novolog 0-15 units tid  Inpatient Diabetes Program Recommendations:    Due to renal function consider decreasing Novolog correction scale to sensitive 0-9 units tid and add Novolog 3-4 units tid meal coverage if patient consumes at least 50% of meals and glucose is at least 80 mg/dl.  Thanks,  Tama Headings RN, MSN, BC-ADM Inpatient Diabetes Coordinator Team Pager [phone] (8a-5p)

## 2018-11-20 NOTE — Progress Notes (Signed)
Pharmacy Antibiotic Note  [name]. is a 43 y.o. male with ESRD on HD MWF admitted on 11/17/2018 with necrotic right TMA.  He had a right BKA today. Of note, patient was receiving vancomycin PTA for 6 weeks for MRSA bacteremia - through 12/16/18 per last ID note.    Pre HD vanc level is elevated at 29. Will not need dose of HD today. Will consider restarting after 4/8 HD session.  Plan: Will consider restarting vancomycin 1g IV QHD-MWF on 4/8 post dialysis Continue Zosyn 3.375 gm IV q8h (4 hour infusion) per vascular F/U vascular recs  Height: 6\' 2"  (188 cm) Weight: 177 lb (80.3 kg) IBW/kg (Calculated) : 82.2  Temp (24hrs), Avg:98.5 F (36.9 C), Min:98.2 F (36.8 C), Max:98.9 F (37.2 C)  Recent Labs  Lab 11/17/18 1414 11/18/18 0207 11/19/18 0416 11/20/18 0235 11/20/18 0730  WBC 44.0* 27.8* 20.0*  --   --   CREATININE 5.43* 6.67* 8.63*  --  10.55*  VANCORANDOM  --   --   --  29  --     Estimated Creatinine Clearance: 10.5 mL/min (A) (by C-G formula based on SCr of 10.55 mg/dL (H)).    Allergies  Allergen Reactions  . Coconut Oil Anaphylaxis and Other (See Comments)    Can use topically, allergic to coconut foods    Elenor Quinones, PharmD, BCPS, BCIDP Clinical Pharmacist 11/20/2018 8:16 AM

## 2018-11-20 NOTE — Progress Notes (Signed)
OT Cancellation Note  Patient Details Name: [name] Partin Sr. MRN: [id number] DOB: 04/27/1976   Cancelled Treatment:    Reason Eval/Treat Not Completed: Patient at procedure or test/ unavailable. At HD, will follow.  Delight Stare, OT Acute Rehabilitation Services Pager [phone] Office [phone]   Delight Stare 11/20/2018, 7:42 AM

## 2018-11-20 NOTE — Progress Notes (Signed)
[facility] ROUNDING NOTE   Subjective:   Is a 43 year old gentleman who was admitted with a right TMA foot infection and underwent right BKA 11/17/2018.  Receives dialysis Monday Wednesday Friday he was seen during his dialysis treatment 11/20/2018.  Blood pressure 144/52 pulse 78 temperature 98.2 O2 sats 100% room air  Sodium 127 potassium 3.7 chloride 92 CO2 20 BUN 62 creatinine 10.55 glucose of 244 calcium 7.7 phosphorus 3.7 Albumin 1.7 WBC 14.9 hemoglobin 7.5 platelets 754  Aspirin 81 mg daily Zosyn 3.375 g every 12 hours, vancomycin per pharmacy, atorvastatin 80 mg daily carvedilol 6.25 mg twice daily, isosorbide 30 mg daily, calcitriol 1.25 mcg Monday Wednesday Friday, insulin sliding scale, insulin glargine 14 units, Fosrenol 2 g 3 times daily, Protonix 40 mg daily.  Patient seen receiving a dialysis treatment 11/20/2018 appears to be tolerating this well.    Objective:  Vital signs in last 24 hours:  Temp:  [98.2 F (36.8 C)-98.9 F (37.2 C)] 98.2 F (36.8 C) (04/06 0446) Pulse Rate:  [76-80] 76 (04/06 0446) Resp:  [10-22] 13 (04/06 0446) BP: (133-144)/(44-76) 144/52 (04/06 0446) SpO2:  [98 %-100 %] 100 % (04/06 0446)  Weight change:  Filed Weights   11/17/18 1408  Weight: 80.3 kg    Intake/Output: I/O last 3 completed shifts: In: 1430 [P.O.:1230; IV Piggyback:200] Out: -    Intake/Output this shift:  No intake/output data recorded.   Chronically ill-appearing male no obvious distress CVS- RRR RS- CTA no wheezes rales ABD- BS present soft non-distended EXT- no edema   left AV fistula with bruit   bilateral BKA with trace edema intact stump with staples on the right no redness erythema or oozing   Basic Metabolic Panel: Recent Labs  Lab 11/17/18 1414 11/18/18 0207 11/19/18 0416 11/20/18 0730  NA 131* 130* 127* 127*  K 3.3* 3.2* 3.4* 3.7  CL 93* 94* 92* 92*  CO2 21* 22 22 20*  GLUCOSE 182* 223* 317* 244*  BUN 20 30* 48* 62*  CREATININE  5.43* 6.67* 8.63* 10.55*  CALCIUM 9.7 8.8* 8.1* 7.7*  PHOS  --   --   --  3.7    Liver Function Tests: Recent Labs  Lab 11/20/18 0730  ALBUMIN 1.7*   No results for input(s): LIPASE, AMYLASE in the last 168 hours. No results for input(s): AMMONIA in the last 168 hours.  CBC: Recent Labs  Lab 11/17/18 1414 11/18/18 0207 11/19/18 0416 11/20/18 0729  WBC 44.0* 27.8* 20.0* 14.9*  HGB 8.8* 7.9* 8.4* 7.5*  HCT 27.2* 24.4* 25.9* 24.0*  MCV 78.6* 78.0* 77.5* 80.3  PLT 699* 662* 733* 754*    Cardiac Enzymes: No results for input(s): CKTOTAL, CKMB, CKMBINDEX, TROPONINI in the last 168 hours.  BNP: Invalid input(s): POCBNP  CBG: Recent Labs  Lab 11/19/18 1105 11/19/18 1627 11/19/18 2117 11/20/18 0619 11/20/18 0818  GLUCAP 173* 182* 291* 48* 158*    Microbiology: Results for orders placed or performed during the hospital encounter of 11/03/18  Blood Culture (routine x 2)     Status: Abnormal   Collection Time: 11/03/18  8:54 AM  Result Value Ref Range Status   Specimen Description BLOOD RIGHT HAND  Final   Special Requests   Final    BOTTLES DRAWN AEROBIC AND ANAEROBIC Blood Culture adequate volume   Culture  Setup Time   Final    GRAM POSITIVE COCCI IN BOTH AEROBIC AND ANAEROBIC BOTTLES CRITICAL RESULT CALLED TO, READ BACK BY AND VERIFIED WITH: C AMEND  PHARMD 11/03/18 2349 JDW Performed at [facility] [address] [place], [place] [address]    Culture METHICILLIN RESISTANT STAPHYLOCOCCUS AUREUS (A)  Final   Report Status 11/06/2018 FINAL  Final   Organism ID, Bacteria METHICILLIN RESISTANT STAPHYLOCOCCUS AUREUS  Final      Susceptibility   Methicillin resistant staphylococcus aureus - MIC*    CIPROFLOXACIN >=8 RESISTANT Resistant     ERYTHROMYCIN >=8 RESISTANT Resistant     GENTAMICIN <=0.5 SENSITIVE Sensitive     OXACILLIN >=4 RESISTANT Resistant     TETRACYCLINE <=1 SENSITIVE Sensitive     VANCOMYCIN 1 SENSITIVE Sensitive     TRIMETH/SULFA <=10  SENSITIVE Sensitive     CLINDAMYCIN <=0.25 SENSITIVE Sensitive     RIFAMPIN <=0.5 SENSITIVE Sensitive     Inducible Clindamycin NEGATIVE Sensitive     * METHICILLIN RESISTANT STAPHYLOCOCCUS AUREUS  Blood Culture ID Panel (Reflexed)     Status: Abnormal   Collection Time: 11/03/18  8:54 AM  Result Value Ref Range Status   Enterococcus species NOT DETECTED NOT DETECTED Final   Listeria monocytogenes NOT DETECTED NOT DETECTED Final   Staphylococcus species DETECTED (A) NOT DETECTED Final    Comment: CRITICAL RESULT CALLED TO, READ BACK BY AND VERIFIED WITH: C AMEND PHARMD 11/03/18 2349 JDW    Staphylococcus aureus (BCID) DETECTED (A) NOT DETECTED Final    Comment: Methicillin (oxacillin)-resistant Staphylococcus aureus (MRSA). MRSA is predictably resistant to beta-lactam antibiotics (except ceftaroline). Preferred therapy is vancomycin unless clinically contraindicated. Patient requires contact precautions if  hospitalized. CRITICAL RESULT CALLED TO, READ BACK BY AND VERIFIED WITH: C AMEND PHARMD 11/03/18 2349 JDW    Methicillin resistance DETECTED (A) NOT DETECTED Final    Comment: CRITICAL RESULT CALLED TO, READ BACK BY AND VERIFIED WITH: C AMEND PHARMD 11/03/18 2349 JDW    Streptococcus species NOT DETECTED NOT DETECTED Final   Streptococcus agalactiae NOT DETECTED NOT DETECTED Final   Streptococcus pneumoniae NOT DETECTED NOT DETECTED Final   Streptococcus pyogenes NOT DETECTED NOT DETECTED Final   Acinetobacter baumannii NOT DETECTED NOT DETECTED Final   Enterobacteriaceae species NOT DETECTED NOT DETECTED Final   Enterobacter cloacae complex NOT DETECTED NOT DETECTED Final   Escherichia coli NOT DETECTED NOT DETECTED Final   Klebsiella oxytoca NOT DETECTED NOT DETECTED Final   Klebsiella pneumoniae NOT DETECTED NOT DETECTED Final   Proteus species NOT DETECTED NOT DETECTED Final   Serratia marcescens NOT DETECTED NOT DETECTED Final   Haemophilus influenzae NOT DETECTED NOT  DETECTED Final   Neisseria meningitidis NOT DETECTED NOT DETECTED Final   Pseudomonas aeruginosa NOT DETECTED NOT DETECTED Final   Candida albicans NOT DETECTED NOT DETECTED Final   Candida glabrata NOT DETECTED NOT DETECTED Final   Candida krusei NOT DETECTED NOT DETECTED Final   Candida parapsilosis NOT DETECTED NOT DETECTED Final   Candida tropicalis NOT DETECTED NOT DETECTED Final    Comment: Performed at [facility], [place]. [address], [place], [place] [address]  Blood Culture (routine x 2)     Status: Abnormal   Collection Time: 11/03/18  9:42 PM  Result Value Ref Range Status   Specimen Description BLOOD RIGHT HAND  Final   Special Requests   Final    BOTTLES DRAWN AEROBIC ONLY Blood Culture results may not be optimal due to an inadequate volume of blood received in culture bottles   Culture  Setup Time   Final    AEROBIC BOTTLE ONLY GRAM POSITIVE COCCI CRITICAL VALUE NOTED.  VALUE IS CONSISTENT WITH PREVIOUSLY REPORTED AND CALLED VALUE. Performed at [facility], [place] [address] [place], [place] [address]    Culture (A)  Final    METHICILLIN RESISTANT STAPHYLOCOCCUS AUREUS CORRECTED ON 03/27 AT 9147: PREVIOUSLY REPORTED AS GRAM POSITIVE COCCI CORRECTED ON 03/26 AT 0124: PREVIOUSLY REPORTED AS NO GROWTH 5 DAYS   Report Status 11/11/2018 FINAL  Final   Organism ID, Bacteria METHICILLIN RESISTANT STAPHYLOCOCCUS AUREUS  Final      Susceptibility   Methicillin resistant staphylococcus aureus - MIC*    CIPROFLOXACIN >=8 RESISTANT Resistant     ERYTHROMYCIN >=8 RESISTANT Resistant     GENTAMICIN <=0.5 SENSITIVE Sensitive     OXACILLIN >=4 RESISTANT Resistant     TETRACYCLINE <=1 SENSITIVE Sensitive     VANCOMYCIN 1 SENSITIVE Sensitive     TRIMETH/SULFA <=10 SENSITIVE Sensitive     CLINDAMYCIN <=0.25 SENSITIVE Sensitive     RIFAMPIN <=0.5 SENSITIVE Sensitive     Inducible Clindamycin NEGATIVE Sensitive     * METHICILLIN RESISTANT STAPHYLOCOCCUS AUREUS CORRECTED ON 03/27  AT 0734: PREVIOUSLY REPORTED AS GRAM POSITIVE COCCI CORRECTED ON 03/26 AT 0124: PREVIOUSLY REPORTED AS NO GROWTH 5 DAYS  Culture, blood (Routine X 2) w Reflex to ID Panel     Status: None   Collection Time: 11/04/18  2:50 AM  Result Value Ref Range Status   Specimen Description BLOOD RIGHT FOREARM  Final   Special Requests   Final    BOTTLES DRAWN AEROBIC AND ANAEROBIC Blood Culture adequate volume   Culture   Final    NO GROWTH 5 DAYS Performed at [facility], [place] [address], [place], [place] [address]    Report Status 11/09/2018 FINAL  Final  Culture, blood (Routine X 2) w Reflex to ID Panel     Status: None   Collection Time: 11/04/18 10:07 AM  Result Value Ref Range Status   Specimen Description BLOOD RIGHT ARM  Final   Special Requests   Final    BOTTLES DRAWN AEROBIC ONLY Blood Culture adequate volume   Culture   Final    NO GROWTH 5 DAYS Performed at [facility], [place] [address], [place], [place] [address]    Report Status 11/09/2018 FINAL  Final    Coagulation Studies: No results for input(s): LABPROT, INR in the last 72 hours.  Urinalysis: No results for input(s): COLORURINE, LABSPEC, PHURINE, GLUCOSEU, HGBUR, BILIRUBINUR, KETONESUR, PROTEINUR, UROBILINOGEN, NITRITE, LEUKOCYTESUR in the last 72 hours.  Invalid input(s): APPERANCEUR    Imaging: No results found.   Medications:   . sodium chloride    . sodium chloride    . sodium chloride    . magnesium sulfate 1 - 4 g bolus IVPB    . piperacillin-tazobactam (ZOSYN)  IV 3.375 g (11/19/18 2004)   . aspirin EC  81 mg Oral Daily  . atorvastatin  80 mg Oral q1800  . calcitRIOL  1.25 mcg Oral Q M,W,F-HD  . carvedilol  6.25 mg Oral BID  . Chlorhexidine Gluconate Cloth  6 each Topical Q0600  . clopidogrel  75 mg Oral Daily  . docusate sodium  100 mg Oral Daily  . heparin  5,000 Units Subcutaneous Q8H  . insulin aspart  0-15 Units Subcutaneous TID WC  . insulin glargine  14 Units Subcutaneous QHS  .  isosorbide mononitrate  30 mg Oral Daily  . lanthanum  2,000 mg Oral TID WC  . multivitamin  1 tablet Oral QHS  .  pantoprazole  40 mg Oral Daily  . sodium chloride flush  3 mL Intravenous Q12H  . vancomycin variable dose per unstable renal function (pharmacist dosing)   Does not apply See admin instructions   sodium chloride, sodium chloride, sodium chloride, acetaminophen **OR** acetaminophen, alteplase, bisacodyl, guaiFENesin-dextromethorphan, heparin, heparin, hydrALAZINE, labetalol, lidocaine (PF), lidocaine-prilocaine, magnesium sulfate 1 - 4 g bolus IVPB, metoprolol tartrate, morphine, morphine injection, ondansetron, pentafluoroprop-tetrafluoroeth, phenol, polyethylene glycol, sodium chloride flush  Assessment/ Plan:   Right TMA foot infection status post right BKA 11/17/2018 appears to be healing well  End-stage renal disease hemodialysis Monday Wednesday Friday dialysis 11/20/2018  Hypertension/volume appears to be stable  Anemia decrease hemoglobin darbepoetin 11/22/2018 IV iron load will be held with infection.  Metabolic bone disease continue medications  Congestive heart failure with a history of systolic dysfunction EF 1007% last 2D echo January 2020  Coronary artery disease status post CABG x3.   LOS: [place] @TODAY @9 :35 AM

## 2018-11-20 NOTE — Progress Notes (Signed)
Report given to Dialysis RN. Plan is to take him dialysis in an hour.

## 2018-11-20 NOTE — Progress Notes (Signed)
PT Cancellation Note  Patient Details Name: [name] Helwig Sr. MRN: [id number] DOB: 11/13/1975   Cancelled Treatment:    Reason Eval/Treat Not Completed: Patient at procedure or test/unavailable. At HD, will follow   [name] 11/20/2018, 7:26 AM

## 2018-11-21 ENCOUNTER — Telehealth: Payer: Self-pay | Admitting: Cardiovascular Disease

## 2018-11-21 LAB — GLUCOSE, CAPILLARY
Glucose-Capillary: 298 mg/dL — ABNORMAL HIGH (ref 70–99)
Glucose-Capillary: 142 mg/dL — ABNORMAL HIGH (ref 70–99)
Glucose-Capillary: 275 mg/dL — ABNORMAL HIGH (ref 70–99)
Glucose-Capillary: 174 mg/dL — ABNORMAL HIGH (ref 70–99)

## 2018-11-21 MED ORDER — CHLORHEXIDINE GLUCONATE CLOTH 2 % EX PADS
6.0000 | MEDICATED_PAD | Freq: Every day | CUTANEOUS | Status: DC
  Administered 2018-11-21: 6 via TOPICAL

## 2018-11-21 MED ORDER — VANCOMYCIN HCL 1000 MG IV SOLR
1000.0000 mg | INTRAVENOUS | Status: DC
  Filled 2018-11-21 (×2): qty 1000

## 2018-11-21 NOTE — Progress Notes (Signed)
Occupational Therapy Treatment Patient Details Name: [name] Hoffmeier Sr. MRN: [id number] DOB: 09/11/1975 Today's Date: 11/21/2018    History of present illness 43 yo male presenting with purulence draining from right foot with significant pain. Recent admission with sepsis secondary to foot infection. 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 PMH including left BKA with prosthetic, CAD, depression, ESRD with HD WMF, GERD, HTN, MI, and DM type ll.    OT comments  Pt presents supine in bed, reports increased pain levels this PM but is agreeable to working with therapy. Educated pt in multiple methods for desensitization and for stretching RLE to promote further extension and reduce risk for contracture - pt with increased difficulty performing full RLE extension at this time. Pt return demonstrating all techniques/education with intermittent cues, improvements noted in tolerance to extension by end of session. Pt engaged in grooming ADL seated EOB with setup assist and supervision for sitting balance, demonstrating supine<>sitting EOB with minguard assist. Encouraged continued OOB with nursing staff when not working with therapies for continued strengthening/endurance as well, pt in agreement (declining OOB today due to increased pain levels). Feel CIR recommendation remains appropriate at this time. Will continue per POC.    Follow Up Recommendations  CIR;Supervision/Assistance - 24 hour    Equipment Recommendations  Other (comment)(TBD)          Precautions / Restrictions Precautions Precautions: Fall Precaution Comments: Prior Left BKA; left prosthetic. Recent R BKA.  Restrictions Weight Bearing Restrictions: Yes RLE Weight Bearing: Non weight bearing(s/p amp)       Mobility Bed Mobility Overal bed mobility: Needs Assistance Bed Mobility: Supine to Sit;Sit to Supine     Supine to sit: Min guard Sit to supine: Min guard;Supervision   General bed mobility comments: minguard for general  safety and lines; pt able to perform transitions without physical assist, reports he has been transitioning to sitting EOB to eat his meals  Transfers                 General transfer comment: pt request to defer today due to pain    Balance Overall balance assessment: Needs assistance Sitting-balance support: No upper extremity supported;Feet supported Sitting balance-Leahy Scale: Fair Sitting balance - Comments: Able to maintain static sitting balance with supervision today                                   ADL either performed or assessed with clinical judgement   ADL Overall ADL's : Needs assistance/impaired     Grooming: Set up;Sitting;Oral care;Wash/dry face;Supervision/safety Grooming Details (indicate cue type and reason): seated EOB, setup assist and supervision for sitting balance                               General ADL Comments: pt with painful RLE, educated in desensitization techniques as well as strategies to promote stretching in RLE to prevent further contracture     Vision       Perception     Praxis      Cognition Arousal/Alertness: Awake/alert Behavior During Therapy: Flat affect Overall Cognitive Status: Within Functional Limits for tasks assessed                                          Exercises  Exercises: Other exercises Other Exercises Other Exercises: educated in desesitization activities and terminal knee extension positioning, stretches for R residual limb - pt return demonstrating all techniques    Shoulder Instructions       General Comments      Pertinent Vitals/ Pain       Pain Assessment: Faces Faces Pain Scale: Hurts whole lot Pain Location: RLE Pain Descriptors / Indicators: Constant;Sharp;Grimacing Pain Intervention(s): Repositioned;Monitored during session(utilized stretching techniques)  Home Living                                          Prior  Functioning/Environment              Frequency  Min 3X/week        Progress Toward Goals  OT Goals(current goals can now be found in the care plan section)  Progress towards OT goals: Progressing toward goals  Acute Rehab OT Goals Patient Stated Goal: "Get stronger" OT Goal Formulation: With patient Time For Goal Achievement: 12/02/18 Potential to Achieve Goals: Good  Plan Discharge plan remains appropriate    Co-evaluation    PT/OT/SLP Co-Evaluation/Treatment: Yes Reason for Co-Treatment: For patient/therapist safety;To address functional/ADL transfers   OT goals addressed during session: ADL's and self-care;Strengthening/ROM      AM-PAC OT "6 Clicks" Daily Activity     Outcome Measure   Help from another person eating meals?: None Help from another person taking care of personal grooming?: A Little Help from another person toileting, which includes using toliet, bedpan, or urinal?: A Little Help from another person bathing (including washing, rinsing, drying)?: A Lot Help from another person to put on and taking off regular upper body clothing?: A Little Help from another person to put on and taking off regular lower body clothing?: A Lot 6 Click Score: 17    End of Session    OT Visit Diagnosis: Unsteadiness on feet (R26.81);Other abnormalities of gait and mobility (R26.89);Muscle weakness (generalized) (M62.81);Pain Pain - Right/Left: Right Pain - part of body: Leg   Activity Tolerance Patient tolerated treatment well   Patient Left in bed;with call bell/phone within reach   Nurse Communication Mobility status        Time: 9604-5409 OT Time Calculation (min): 33 min  Charges: OT General Charges $OT Visit: 1 Visit OT Treatments $Self Care/Home Management : 8-22 mins  Lou Cal, [place] Pager [phone] Office [phone]    Raymondo Band 11/21/2018, 4:58 PM

## 2018-11-21 NOTE — Progress Notes (Signed)
Inpatient Diabetes Program Recommendations  AACE/ADA: New Consensus Statement on Inpatient Glycemic Control (2015)  Target Ranges:  Prepandial:   less than 140 mg/dL      Peak postprandial:   less than 180 mg/dL (1-2 hours)      Critically ill patients:  140 - 180 mg/dL   Lab Results  Component Value Date   GLUCAP 298 (H) 11/21/2018   HGBA1C 7.8 (H) 02/07/2018    Review of Glycemic Control Results for Fazzino, [name] LEE SR. (MRN [id number]) as of 11/21/2018 09:21  Ref. Range 11/20/2018 08:18 11/20/2018 11:55 11/20/2018 16:21 11/20/2018 22:13 11/21/2018 06:37  Glucose-Capillary Latest Ref Range: 70 - 99 mg/dL 158 (H) 224 (H) 175 (H) 319 (H) 298 (H)   Diabetes history: DM2 Outpatient Diabetes medications: Latus 14 units qhs Current orders for Inpatient glycemic control: Lantus 14 units qhs, Novolog 0-15 units tid  Inpatient Diabetes Program Recommendations:   Due to renal function consider: -Decrease Novolog correction scale to sensitive 0-9 units tid -Add Novolog 3-4 units tid meal coverage if patient consumes at least 50% of meals and glucose is at least 80 mg/dl.  Thank you, Nani Gasser. Arabella Revelle, RN, MSN, CDE  Diabetes Coordinator Inpatient Glycemic Control Team Team Pager [phone] (8am-5pm) 11/21/2018 9:21 AM

## 2018-11-21 NOTE — Progress Notes (Signed)
Inpatient Rehabilitation Admissions Coordinator  Patient's girlfriend is currently hospitalized. After patient's first BKA he resided at [facility] for 15 months due to lack of housing and caregiver support. I will await his progress with therapy as well as clarification of other assistance before proceeding with a possible inpt rehab admission. He has had PCS assistance in the past with [facility]. May need SNF.  Danne Baxter, RN, MSN Rehab Admissions Coordinator [phone] 11/21/2018 11:24 AM

## 2018-11-21 NOTE — Progress Notes (Signed)
Physical Therapy Treatment Patient Details Name: [name] Closson Sr. MRN: [id number] DOB: 1975/09/18 Today's Date: 11/21/2018    History of Present Illness 43 yo male presenting with purulence draining from right foot with significant pain. Recent admission with sepsis secondary to foot infection. 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 PMH including left BKA with prosthetic, CAD, depression, ESRD with HD WMF, GERD, HTN, MI, and DM type ll.     PT Comments    Patient seen for activity progression and reeducation related to trunk control and right LE residual limb management. Tolerated exercises with cues. Noted increased pain with activities today. Additionally, patient tolerated extended time at EOB for balance progression. Current POC remains appropriate.   Follow Up Recommendations  CIR     Equipment Recommendations  (TBD)    Recommendations for Other Services Rehab consult     Precautions / Restrictions Precautions Precautions: Fall Precaution Comments: Prior Left BKA; left prosthetic. Recent R BKA.  Restrictions Weight Bearing Restrictions: Yes RLE Weight Bearing: Non weight bearing(s/p amp)    Mobility  Bed Mobility Overal bed mobility: Needs Assistance Bed Mobility: Supine to Sit;Sit to Supine     Supine to sit: Min guard Sit to supine: Min guard;Supervision   General bed mobility comments: minguard for general safety and lines; pt able to perform transitions without physical assist, reports he has been transitioning to sitting EOB to eat his meals  Transfers                 General transfer comment: pt request to defer today due to pain  Ambulation/Gait                 Stairs             Wheelchair Mobility    Modified Rankin (Stroke Patients Only)       Balance Overall balance assessment: Needs assistance Sitting-balance support: No upper extremity supported;Feet supported Sitting balance-Leahy Scale: Fair Sitting balance - Comments:  Able to maintain static sitting balance with supervision today                                    Cognition Arousal/Alertness: Awake/alert Behavior During Therapy: Flat affect Overall Cognitive Status: Within Functional Limits for tasks assessed                                        Exercises General Exercises - Lower Extremity Ankle Circles/Pumps: AROM;Right Quad Sets: AROM;10 reps;Both;Supine;Right Other Exercises Other Exercises: educated in desesitization activities and terminal knee extension positioning, stretches for R residual limb - pt return demonstrating all techniques  Other Exercises: educated in active assisted stretch performed x5 with extended holds 3-5 seconds Other Exercises: dynamic sitting balance activities at EOB for balance and postural control ~10 minutes Other Exercises: Positioning education for contracture prevention    General Comments        Pertinent Vitals/Pain Pain Assessment: Faces Faces Pain Scale: Hurts whole lot Pain Location: RLE Pain Descriptors / Indicators: Constant;Sharp;Grimacing Pain Intervention(s): Repositioned;Monitored during session(utilized stretching techniques)    Home Living                      Prior Function            PT Goals (current goals can now be found in the  care plan section) Acute Rehab PT Goals Patient Stated Goal: "Get stronger" PT Goal Formulation: With patient Time For Goal Achievement: 12/02/18 Potential to Achieve Goals: Good Progress towards PT goals: Progressing toward goals    Frequency    Min 3X/week      PT Plan Current plan remains appropriate    Co-evaluation   Reason for Co-Treatment: For patient/therapist safety;To address functional/ADL transfers PT goals addressed during session: Mobility/safety with mobility OT goals addressed during session: ADL's and self-care;Strengthening/ROM      AM-PAC PT "6 Clicks" Mobility   Outcome  Measure  Help needed turning from your back to your side while in a flat bed without using bedrails?: A Little Help needed moving from lying on your back to sitting on the side of a flat bed without using bedrails?: A Little Help needed moving to and from a bed to a chair (including a wheelchair)?: A Little Help needed standing up from a chair using your arms (e.g., wheelchair or bedside chair)?: A Lot Help needed to walk in hospital room?: A Lot Help needed climbing 3-5 steps with a railing? : Total 6 Click Score: 14    End of Session   Activity Tolerance: Patient limited by pain Patient left: in bed;with call bell/phone within reach Nurse Communication: Mobility status PT Visit Diagnosis: Other abnormalities of gait and mobility (R26.89);Pain Pain - Right/Left: Right Pain - part of body: Ankle and joints of foot     Time: 3403-7096 PT Time Calculation (min) (ACUTE ONLY): 28 min  Charges:  $Therapeutic Activity: 8-22 mins                     Alben Deeds, PT DPT  Board Certified Neurologic Specialist [place] Pager [phone] Office [place] 11/21/2018, 5:21 PM

## 2018-11-21 NOTE — Progress Notes (Addendum)
  Progress Note VASCULAR SURGERY ASSESSMENT & PLAN:   STATUS POST RIGHT BELOW THE KNEE AMPUTATION: His right below the knee amputation site is healing well.  He is awaiting a bed on CIR pending his coronavirus test results.  The plan is to discontinue the intravenous antibiotics tomorrow.  Deitra Mayo, MD, FACS Beeper [phone] Office: [phone]   11/21/2018 8:59 AM 4 Days Post-Op  Subjective:  No complaints  Tm 99.3 now afebrile HR 52'W-41'L NSR 244'W systolic 10% RA  Vitals:   11/20/18 1938 11/21/18 0805  BP: (!) 126/39 138/73  Pulse: 90 80  Resp: 13 15  Temp: 99.3 F (37.4 C) 98.3 F (36.8 C)  SpO2: 100% 95%    Physical Exam: Incisions:  Clean and dry with staples in tact.    CBC    Component Value Date/Time   WBC 14.9 (H) 11/20/2018 0729   RBC 2.99 (L) 11/20/2018 0729   HGB 7.5 (L) 11/20/2018 0729   HGB 14.3 02/07/2018 0937   HCT 24.0 (L) 11/20/2018 0729   HCT 42.8 02/07/2018 0937   PLT 754 (H) 11/20/2018 0729   PLT 369 02/07/2018 0937   MCV 80.3 11/20/2018 0729   MCV 82 02/07/2018 0937   MCH 25.1 (L) 11/20/2018 0729   MCHC 31.3 11/20/2018 0729   RDW 19.0 (H) 11/20/2018 0729   RDW 21.5 (H) 02/07/2018 0937   LYMPHSABS 1.3 11/03/2018 0848   MONOABS 1.0 11/03/2018 0848   EOSABS 0.1 11/03/2018 0848   BASOSABS 0.0 11/03/2018 0848    BMET    Component Value Date/Time   NA 127 (L) 11/20/2018 0730   NA 133 (L) 02/07/2018 0937   K 3.7 11/20/2018 0730   CL 92 (L) 11/20/2018 0730   CO2 20 (L) 11/20/2018 0730   GLUCOSE 244 (H) 11/20/2018 0730   GLUCOSE 262 03/06/2008   BUN 62 (H) 11/20/2018 0730   BUN 37 (H) 02/07/2018 0937   CREATININE 10.55 (H) 11/20/2018 0730   CALCIUM 7.7 (L) 11/20/2018 0730   GFRNONAA 5 (L) 11/20/2018 0730   GFRAA 6 (L) 11/20/2018 0730    INR    Component Value Date/Time   INR 1.25 11/04/2017 1450     Intake/Output Summary (Last 24 hours) at 11/21/2018 0859 Last data filed at 11/20/2018 1300 Gross per 24 hour   Intake 183 ml  Output 4000 ml  Net -3817 ml     Assessment/Plan:  43 y.o. male is s/p right below knee amputation  4 Days Post-Op  -stump looks good with staples in tact. -leukocytosis improved yesterday to 14k (down from 44k on admit) had low grade temp of 99.3 last evening.  Needs to use IS -GF covid results still pending at this time-CIR consult pending these results.  Continue to wear mask until these results come back.  -DM coordinator for assistance with diabetes   Leontine Locket, PA-C [facility] [phone] 11/21/2018 8:59 AM

## 2018-11-21 NOTE — Progress Notes (Signed)
Pharmacy Antibiotic Note  [name]. is a 43 y.o. male with ESRD on HD MWF admitted on 11/17/2018 with necrotic right TMA.  He had a right BKA 4/3. Of note, patient was receiving vancomycin PTA for 6 weeks for MRSA bacteremia - through 12/16/18 per last ID note.   -Pre HD vanc level is elevated at 29. He tolerated his full HD session 4/6 and would estimate a vancomycin level 15-20 at this time  Plan: -Restart vancomycin 1g IV QHD-MWF on 4/8 post dialysis -Continue Zosyn 3.375 gm IV q8h (4 hour infusion) per vascular -F/U vascular recs  Height: 6\' 2"  (188 cm) Weight: 162 lb 4.1 oz (73.6 kg) IBW/kg (Calculated) : 82.2  Temp (24hrs), Avg:98.9 F (37.2 C), Min:98.4 F (36.9 C), Max:99.3 F (37.4 C)  Recent Labs  Lab 11/17/18 1414 11/18/18 0207 11/19/18 0416 11/20/18 0235 11/20/18 0729 11/20/18 0730  WBC 44.0* 27.8* 20.0*  --  14.9*  --   CREATININE 5.43* 6.67* 8.63*  --   --  10.55*  VANCORANDOM  --   --   --  29  --   --     Estimated Creatinine Clearance: 9.6 mL/min (A) (by C-G formula based on SCr of 10.55 mg/dL (H)).    Allergies  Allergen Reactions  . Coconut Oil Anaphylaxis and Other (See Comments)    Can use topically, allergic to coconut foods    Hildred Laser, PharmD Clinical Pharmacist **Pharmacist phone directory can now be found on [email or web address] ([email or web address]).  Listed under [place].

## 2018-11-21 NOTE — Progress Notes (Signed)
[facility] ROUNDING NOTE   Subjective:   Is a 43 year old gentleman who was admitted with a right TMA foot infection and underwent right BKA 11/17/2018.  Receives dialysis Monday Wednesday Friday he was seen during his dialysis treatment 11/20/2018.  Removal of 4 L obtained weight 73.6 kg  Blood pressure 126/39 pulse 90 temperature 99.3 O2 sats 90% room air  Labs pending this morning.  Aspirin 81 mg daily Zosyn 3.375 g every 12 hours, vancomycin per pharmacy, atorvastatin 80 mg daily carvedilol 6.25 mg twice daily, isosorbide 30 mg daily, calcitriol 1.25 mcg Monday Wednesday Friday, insulin sliding scale, insulin glargine 14 units, Fosrenol 2 g 3 times daily, Protonix 40 mg daily.   Blood cultures no growth from 11/04/2018    Objective:  Vital signs in last 24 hours:  Temp:  [98.4 F (36.9 C)-99.3 F (37.4 C)] 99.3 F (37.4 C) (04/06 1938) Pulse Rate:  [81-94] 90 (04/06 1938) Resp:  [13-19] 13 (04/06 1938) BP: (123-163)/(39-75) 126/39 (04/06 1938) SpO2:  [100 %] 100 % (04/06 1938) Weight:  [73.6 kg] 73.6 kg (04/06 1130)  Weight change:  Filed Weights   11/17/18 1408 11/20/18 0705 11/20/18 1130  Weight: 80.3 kg 77.6 kg 73.6 kg    Intake/Output: I/O last 3 completed shifts: In: 433 [P.O.:330; I.V.:3; IV Piggyback:100] Out: 4000 [Other:4000]   Intake/Output this shift:  No intake/output data recorded.   Chronically ill-appearing male no obvious distress CVS- RRR RS- CTA no wheezes rales ABD- BS present soft non-distended EXT- no edema   left AV fistula with bruit   bilateral BKA with trace edema intact stump with staples on the right no redness erythema or oozing   Basic Metabolic Panel: Recent Labs  Lab 11/17/18 1414 11/18/18 0207 11/19/18 0416 11/20/18 0730  NA 131* 130* 127* 127*  K 3.3* 3.2* 3.4* 3.7  CL 93* 94* 92* 92*  CO2 21* 22 22 20*  GLUCOSE 182* 223* 317* 244*  BUN 20 30* 48* 62*  CREATININE 5.43* 6.67* 8.63* 10.55*  CALCIUM 9.7 8.8*  8.1* 7.7*  PHOS  --   --   --  3.7    Liver Function Tests: Recent Labs  Lab 11/20/18 0730  ALBUMIN 1.7*   No results for input(s): LIPASE, AMYLASE in the last 168 hours. No results for input(s): AMMONIA in the last 168 hours.  CBC: Recent Labs  Lab 11/17/18 1414 11/18/18 0207 11/19/18 0416 11/20/18 0729  WBC 44.0* 27.8* 20.0* 14.9*  HGB 8.8* 7.9* 8.4* 7.5*  HCT 27.2* 24.4* 25.9* 24.0*  MCV 78.6* 78.0* 77.5* 80.3  PLT 699* 662* 733* 754*    Cardiac Enzymes: No results for input(s): CKTOTAL, CKMB, CKMBINDEX, TROPONINI in the last 168 hours.  BNP: Invalid input(s): POCBNP  CBG: Recent Labs  Lab 11/20/18 0818 11/20/18 1155 11/20/18 1621 11/20/18 2213 11/21/18 0637  GLUCAP 158* 224* 175* 319* 35*    Microbiology: Results for orders placed or performed during the hospital encounter of 11/03/18  Blood Culture (routine x 2)     Status: Abnormal   Collection Time: 11/03/18  8:54 AM  Result Value Ref Range Status   Specimen Description BLOOD RIGHT HAND  Final   Special Requests   Final    BOTTLES DRAWN AEROBIC AND ANAEROBIC Blood Culture adequate volume   Culture  Setup Time   Final    GRAM POSITIVE COCCI IN BOTH AEROBIC AND ANAEROBIC BOTTLES CRITICAL RESULT CALLED TO, READ BACK BY AND VERIFIED WITH: C AMEND PHARMD 11/03/18 2349 JDW  Performed at [facility], [place] [address], [place], [place] [address]    Culture METHICILLIN RESISTANT STAPHYLOCOCCUS AUREUS (A)  Final   Report Status 11/06/2018 FINAL  Final   Organism ID, Bacteria METHICILLIN RESISTANT STAPHYLOCOCCUS AUREUS  Final      Susceptibility   Methicillin resistant staphylococcus aureus - MIC*    CIPROFLOXACIN >=8 RESISTANT Resistant     ERYTHROMYCIN >=8 RESISTANT Resistant     GENTAMICIN <=0.5 SENSITIVE Sensitive     OXACILLIN >=4 RESISTANT Resistant     TETRACYCLINE <=1 SENSITIVE Sensitive     VANCOMYCIN 1 SENSITIVE Sensitive     TRIMETH/SULFA <=10 SENSITIVE Sensitive     CLINDAMYCIN <=0.25  SENSITIVE Sensitive     RIFAMPIN <=0.5 SENSITIVE Sensitive     Inducible Clindamycin NEGATIVE Sensitive     * METHICILLIN RESISTANT STAPHYLOCOCCUS AUREUS  Blood Culture ID Panel (Reflexed)     Status: Abnormal   Collection Time: 11/03/18  8:54 AM  Result Value Ref Range Status   Enterococcus species NOT DETECTED NOT DETECTED Final   Listeria monocytogenes NOT DETECTED NOT DETECTED Final   Staphylococcus species DETECTED (A) NOT DETECTED Final    Comment: CRITICAL RESULT CALLED TO, READ BACK BY AND VERIFIED WITH: C AMEND PHARMD 11/03/18 2349 JDW    Staphylococcus aureus (BCID) DETECTED (A) NOT DETECTED Final    Comment: Methicillin (oxacillin)-resistant Staphylococcus aureus (MRSA). MRSA is predictably resistant to beta-lactam antibiotics (except ceftaroline). Preferred therapy is vancomycin unless clinically contraindicated. Patient requires contact precautions if  hospitalized. CRITICAL RESULT CALLED TO, READ BACK BY AND VERIFIED WITH: C AMEND PHARMD 11/03/18 2349 JDW    Methicillin resistance DETECTED (A) NOT DETECTED Final    Comment: CRITICAL RESULT CALLED TO, READ BACK BY AND VERIFIED WITH: C AMEND PHARMD 11/03/18 2349 JDW    Streptococcus species NOT DETECTED NOT DETECTED Final   Streptococcus agalactiae NOT DETECTED NOT DETECTED Final   Streptococcus pneumoniae NOT DETECTED NOT DETECTED Final   Streptococcus pyogenes NOT DETECTED NOT DETECTED Final   Acinetobacter baumannii NOT DETECTED NOT DETECTED Final   Enterobacteriaceae species NOT DETECTED NOT DETECTED Final   Enterobacter cloacae complex NOT DETECTED NOT DETECTED Final   Escherichia coli NOT DETECTED NOT DETECTED Final   Klebsiella oxytoca NOT DETECTED NOT DETECTED Final   Klebsiella pneumoniae NOT DETECTED NOT DETECTED Final   Proteus species NOT DETECTED NOT DETECTED Final   Serratia marcescens NOT DETECTED NOT DETECTED Final   Haemophilus influenzae NOT DETECTED NOT DETECTED Final   Neisseria meningitidis NOT  DETECTED NOT DETECTED Final   Pseudomonas aeruginosa NOT DETECTED NOT DETECTED Final   Candida albicans NOT DETECTED NOT DETECTED Final   Candida glabrata NOT DETECTED NOT DETECTED Final   Candida krusei NOT DETECTED NOT DETECTED Final   Candida parapsilosis NOT DETECTED NOT DETECTED Final   Candida tropicalis NOT DETECTED NOT DETECTED Final    Comment: Performed at [facility], [place]. [address] [place], [place] [address]  Blood Culture (routine x 2)     Status: Abnormal   Collection Time: 11/03/18  9:42 PM  Result Value Ref Range Status   Specimen Description BLOOD RIGHT HAND  Final   Special Requests   Final    BOTTLES DRAWN AEROBIC ONLY Blood Culture results may not be optimal due to an inadequate volume of blood received in culture bottles   Culture  Setup Time   Final    AEROBIC BOTTLE ONLY GRAM POSITIVE COCCI CRITICAL VALUE NOTED.  VALUE IS CONSISTENT  WITH PREVIOUSLY REPORTED AND CALLED VALUE. Performed at [facility], [place] [address], [place], [place] [address]    Culture (A)  Final    METHICILLIN RESISTANT STAPHYLOCOCCUS AUREUS CORRECTED ON 03/27 AT 6468: PREVIOUSLY REPORTED AS GRAM POSITIVE COCCI CORRECTED ON 03/26 AT 0124: PREVIOUSLY REPORTED AS NO GROWTH 5 DAYS   Report Status 11/11/2018 FINAL  Final   Organism ID, Bacteria METHICILLIN RESISTANT STAPHYLOCOCCUS AUREUS  Final      Susceptibility   Methicillin resistant staphylococcus aureus - MIC*    CIPROFLOXACIN >=8 RESISTANT Resistant     ERYTHROMYCIN >=8 RESISTANT Resistant     GENTAMICIN <=0.5 SENSITIVE Sensitive     OXACILLIN >=4 RESISTANT Resistant     TETRACYCLINE <=1 SENSITIVE Sensitive     VANCOMYCIN 1 SENSITIVE Sensitive     TRIMETH/SULFA <=10 SENSITIVE Sensitive     CLINDAMYCIN <=0.25 SENSITIVE Sensitive     RIFAMPIN <=0.5 SENSITIVE Sensitive     Inducible Clindamycin NEGATIVE Sensitive     * METHICILLIN RESISTANT STAPHYLOCOCCUS AUREUS CORRECTED ON 03/27 AT 0734: PREVIOUSLY REPORTED AS GRAM  POSITIVE COCCI CORRECTED ON 03/26 AT 0124: PREVIOUSLY REPORTED AS NO GROWTH 5 DAYS  Culture, blood (Routine X 2) w Reflex to ID Panel     Status: None   Collection Time: 11/04/18  2:50 AM  Result Value Ref Range Status   Specimen Description BLOOD RIGHT FOREARM  Final   Special Requests   Final    BOTTLES DRAWN AEROBIC AND ANAEROBIC Blood Culture adequate volume   Culture   Final    NO GROWTH 5 DAYS Performed at [facility], [place] [address] [place], [place] [address]    Report Status 11/09/2018 FINAL  Final  Culture, blood (Routine X 2) w Reflex to ID Panel     Status: None   Collection Time: 11/04/18 10:07 AM  Result Value Ref Range Status   Specimen Description BLOOD RIGHT ARM  Final   Special Requests   Final    BOTTLES DRAWN AEROBIC ONLY Blood Culture adequate volume   Culture   Final    NO GROWTH 5 DAYS Performed at [facility], [place] [address], [place], [place] [address]    Report Status 11/09/2018 FINAL  Final    Coagulation Studies: No results for input(s): LABPROT, INR in the last 72 hours.  Urinalysis: No results for input(s): COLORURINE, LABSPEC, PHURINE, GLUCOSEU, HGBUR, BILIRUBINUR, KETONESUR, PROTEINUR, UROBILINOGEN, NITRITE, LEUKOCYTESUR in the last 72 hours.  Invalid input(s): APPERANCEUR    Imaging: No results found.   Medications:   . sodium chloride    . magnesium sulfate 1 - 4 g bolus IVPB    . piperacillin-tazobactam (ZOSYN)  IV 3.375 g (11/20/18 2102)  . [START ON 11/22/2018] vancomycin     . aspirin EC  81 mg Oral Daily  . atorvastatin  80 mg Oral q1800  . calcitRIOL  1.25 mcg Oral Q M,W,F-HD  . carvedilol  6.25 mg Oral BID  . Chlorhexidine Gluconate Cloth  6 each Topical Q0600  . clopidogrel  75 mg Oral Daily  . docusate sodium  100 mg Oral Daily  . heparin  5,000 Units Subcutaneous Q8H  . insulin aspart  0-15 Units Subcutaneous TID WC  . insulin glargine  14 Units Subcutaneous QHS  . isosorbide mononitrate  30 mg Oral Daily   . lanthanum  2,000 mg Oral TID WC  . multivitamin  1 tablet Oral QHS  . pantoprazole  40 mg Oral Daily  .  sodium chloride flush  3 mL Intravenous Q12H  . 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  No growth and blood cultures  End-stage renal disease hemodialysis Monday Wednesday Friday ultrafiltration 4 L 11/20/2018 next dialysis 11/22/2018  Anemia decrease hemoglobin darbepoetin 11/22/2018 IV iron load will be held with infection.  Metabolic bone disease continue medications  Congestive heart failure with a history of systolic dysfunction EF 8957% last 2D echo January 2020  Coronary artery disease status post CABG x3.   LOS: [place] @TODAY @8 :00 AM

## 2018-11-22 LAB — CBC
HCT: 23.5 % — ABNORMAL LOW (ref 39.0–52.0)
Hemoglobin: 7 g/dL — ABNORMAL LOW (ref 13.0–17.0)
MCH: 24.2 pg — ABNORMAL LOW (ref 26.0–34.0)
MCHC: 29.8 g/dL — ABNORMAL LOW (ref 30.0–36.0)
MCV: 81.3 fL (ref 80.0–100.0)
Platelets: 761 10*3/uL — ABNORMAL HIGH (ref 150–400)
RBC: 2.89 MIL/uL — ABNORMAL LOW (ref 4.22–5.81)
RDW: 18.7 % — ABNORMAL HIGH (ref 11.5–15.5)
WBC: 10 10*3/uL (ref 4.0–10.5)
nRBC: 0 % (ref 0.0–0.2)

## 2018-11-22 LAB — RENAL FUNCTION PANEL
Albumin: 1.7 g/dL — ABNORMAL LOW (ref 3.5–5.0)
Anion gap: 12 (ref 5–15)
BUN: 49 mg/dL — ABNORMAL HIGH (ref 6–20)
CO2: 22 mmol/L (ref 22–32)
Calcium: 6.9 mg/dL — ABNORMAL LOW (ref 8.9–10.3)
Chloride: 92 mmol/L — ABNORMAL LOW (ref 98–111)
Creatinine, Ser: 9.41 mg/dL — ABNORMAL HIGH (ref 0.61–1.24)
GFR calc Af Amer: 7 mL/min — ABNORMAL LOW (ref >60)
GFR calc non Af Amer: 6 mL/min — ABNORMAL LOW (ref >60)
Glucose, Bld: 175 mg/dL — ABNORMAL HIGH (ref 70–99)
Phosphorus: 3.5 mg/dL (ref 2.5–4.6)
Potassium: 3.5 mmol/L (ref 3.5–5.1)
Sodium: 126 mmol/L — ABNORMAL LOW (ref 135–145)

## 2018-11-22 LAB — GLUCOSE, CAPILLARY
Glucose-Capillary: 280 mg/dL — ABNORMAL HIGH (ref 70–99)
Glucose-Capillary: 191 mg/dL — ABNORMAL HIGH (ref 70–99)
Glucose-Capillary: 287 mg/dL — ABNORMAL HIGH (ref 70–99)
Glucose-Capillary: 213 mg/dL — ABNORMAL HIGH (ref 70–99)

## 2018-11-22 MED ORDER — CALCITRIOL 0.25 MCG PO CAPS
ORAL_CAPSULE | ORAL | Status: AC
  Filled 2018-11-22: qty 1

## 2018-11-22 MED ORDER — SODIUM CHLORIDE 0.9 % IV SOLN: 100.0000 mL | INTRAVENOUS | Status: DC | PRN

## 2018-11-22 MED ORDER — ALTEPLASE 2 MG IJ SOLR: 2.0000 mg | Freq: Once | INTRAMUSCULAR | Status: DC | PRN

## 2018-11-22 MED ORDER — HEPARIN SODIUM (PORCINE) 1000 UNIT/ML DIALYSIS
1000.0000 [IU] | INTRAMUSCULAR | Status: DC | PRN
  Filled 2018-11-22: qty 1

## 2018-11-22 MED ORDER — CALCITRIOL 0.5 MCG PO CAPS
ORAL_CAPSULE | ORAL | Status: AC
  Administered 2018-11-22: 1.25 ug via ORAL
  Filled 2018-11-22: qty 2

## 2018-11-22 MED ORDER — VANCOMYCIN HCL IN DEXTROSE 1-5 GM/200ML-% IV SOLN
INTRAVENOUS | Status: AC
  Administered 2018-11-22: 1000 mg
  Filled 2018-11-22: qty 200

## 2018-11-22 NOTE — Progress Notes (Addendum)
  Progress Note    11/22/2018 8:17 AM 5 Days Post-Op  Subjective:  No new complaints this morning   Vitals:   11/21/18 1955 11/22/18 0330  BP: (!) 141/40 (!) 164/41  Pulse: 82 80  Resp: 16 16  Temp: 98.3 F (36.8 C) 98.7 F (37.1 C)  SpO2: 99% 99%   Physical Exam: Lungs:  Non labored Incisions:  R BKA incision healing well with staples in place Extremities:  L BKA without wounds Neurologic: A&O  CBC    Component Value Date/Time   WBC 14.9 (H) 11/20/2018 0729   RBC 2.99 (L) 11/20/2018 0729   HGB 7.5 (L) 11/20/2018 0729   HGB 14.3 02/07/2018 0937   HCT 24.0 (L) 11/20/2018 0729   HCT 42.8 02/07/2018 0937   PLT 754 (H) 11/20/2018 0729   PLT 369 02/07/2018 0937   MCV 80.3 11/20/2018 0729   MCV 82 02/07/2018 0937   MCH 25.1 (L) 11/20/2018 0729   MCHC 31.3 11/20/2018 0729   RDW 19.0 (H) 11/20/2018 0729   RDW 21.5 (H) 02/07/2018 0937   LYMPHSABS 1.3 11/03/2018 0848   MONOABS 1.0 11/03/2018 0848   EOSABS 0.1 11/03/2018 0848   BASOSABS 0.0 11/03/2018 0848    BMET    Component Value Date/Time   NA 127 (L) 11/20/2018 0730   NA 133 (L) 02/07/2018 0937   K 3.7 11/20/2018 0730   CL 92 (L) 11/20/2018 0730   CO2 20 (L) 11/20/2018 0730   GLUCOSE 244 (H) 11/20/2018 0730   GLUCOSE 262 03/06/2008   BUN 62 (H) 11/20/2018 0730   BUN 37 (H) 02/07/2018 0937   CREATININE 10.55 (H) 11/20/2018 0730   CALCIUM 7.7 (L) 11/20/2018 0730   GFRNONAA 5 (L) 11/20/2018 0730   GFRAA 6 (L) 11/20/2018 0730    INR    Component Value Date/Time   INR 1.25 11/04/2017 1450     Intake/Output Summary (Last 24 hours) at 11/22/2018 0817 Last data filed at 11/22/2018 0330 Gross per 24 hour  Intake 1230 ml  Output 0 ml  Net 1230 ml     Assessment/Plan:  43 y.o. male is s/p R BKA 5 Days Post-Op   R BKA healing well [employer] stump sock ordered CIR pending COVID results of girlfriend on 2W   [name], [name] [facility] [phone] 11/22/2018 8:17 AM  I have  seen and evaluated the patient. I agree with the PA note as documented above. R BKA healing without issue.  CIR pending.  Girlfriend Covid test negative per chart review.  WBC now 10 from 44 on admission.  [name], MD [facility] [place] Office: [phone] Pager: [phone]

## 2018-11-22 NOTE — Progress Notes (Addendum)
Inpatient Rehabilitation Admissions Coordinator  I met with patient at bedside to discuss goals and expectations of an inpt rehab admit. I explained that we are short term rehab unlike being at SNF for 15 months after his last BKA. He is to check with his niece and sister tonight about assistance they may provide and think about if he is willing to do intensive rehab. I will follow up tomorrow for possible admit.   Danne Baxter, RN, MSN Rehab Admissions Coordinator [phone] 11/22/2018 1:58 PM

## 2018-11-22 NOTE — Progress Notes (Signed)
[facility] [facility] Progress Note Patient Details:  [name] Nanna Sr. 01-04-1976 [id number]  Patient ID: [name], male   DOB: 1975/10/21, 43 y.o.   MRN: [id number]   [place] 11/22/2018, 10:50 AMCalled [employer] for right BKA stump sock.

## 2018-11-22 NOTE — Progress Notes (Signed)
Inpatient Diabetes Program Recommendations  AACE/ADA: New Consensus Statement on Inpatient Glycemic Control (2015)  Target Ranges:  Prepandial:   less than 140 mg/dL      Peak postprandial:   less than 180 mg/dL (1-2 hours)      Critically ill patients:  140 - 180 mg/dL   Lab Results  Component Value Date   GLUCAP 280 (H) 11/22/2018   HGBA1C 7.8 (H) 02/07/2018    Review of Glycemic Control Results for [name], [name]. (MRN [id number]) as of 11/22/2018 12:32  Ref. Range 11/21/2018 06:37 11/21/2018 11:32 11/21/2018 16:09 11/21/2018 21:22 11/22/2018 06:19  Glucose-Capillary Latest Ref Range: 70 - 99 mg/dL 298 (H) 142 (H) 275 (H) 174 (H) 280 (H)   Diabetes history:DM2 Outpatient Diabetes medications:Latus 14 units qhs Current orders for Inpatient glycemic control:Lantus 14 units qhs, Novolog 0-15 units tid  Inpatient Diabetes Program Recommendations: Due to renal function consider: -Decrease Novolog correction scale to sensitive 0-9 units tid -Add Novolog 3-4 units tid meal coverage if patient consumes at least 50% of meals and glucose is at least 80 mg/dl.  Thank you, [name]. [name] Christoffel, RN, MSN, CDE  Diabetes Coordinator Inpatient Glycemic Control Team Team Pager [phone] (8am-5pm) 11/22/2018 12:38 PM

## 2018-11-22 NOTE — Progress Notes (Signed)
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  Receives dialysis Monday Wednesday Friday.  He is scheduled for dialysis 11/22/2018.  He is receiving his dialysis treatment  Blood pressure 160/41 pulse 80 temperature 98.7 O2 sats 99% room air  Sodium 127 potassium 3.7C0 220 chloride 92 glucose 244 BUN 62 creatinine 10.55 calcium 7.7 phosphorus 3.7 Albumin 1.7 WBC 14.9 hemoglobin 7.5 platelets 754  Aspirin 81 mg daily Zosyn 3.375 g every 12 hours, vancomycin per pharmacy, atorvastatin 80 mg daily carvedilol 6.25 mg twice daily, isosorbide 30 mg daily, calcitriol 1.25 mcg Monday Wednesday Friday, insulin sliding scale, insulin glargine 14 units, Fosrenol 2 g 3 times daily, Protonix 40 mg daily.   Blood cultures no growth from 11/04/2018    Objective:  Vital signs in last 24 hours:  Temp:  [98.3 F (36.8 C)-98.8 F (37.1 C)] 98.7 F (37.1 C) (04/08 0330) Pulse Rate:  [78-86] 80 (04/08 0330) Resp:  [13-19] 16 (04/08 0330) BP: (131-164)/(35-53) 164/41 (04/08 0330) SpO2:  [98 %-100 %] 99 % (04/08 0330) Weight:  [76.3 kg] 76.3 kg (04/08 0330)  Weight change: -1.3 kg Filed Weights   11/20/18 0705 11/20/18 1130 11/22/18 0330  Weight: 77.6 kg 73.6 kg 76.3 kg    Intake/Output: I/O last 3 completed shifts: In: 1610 [P.O.:1440; IV Piggyback:150] Out: 0    Intake/Output this shift:  No intake/output data recorded.   Chronically ill-appearing male no obvious distress CVS- RRR RS- CTA no wheezes rales ABD- BS present soft non-distended EXT- no edema   left AV fistula with bruit   bilateral BKA with trace edema intact stump with staples on the right no redness erythema or oozing   Basic Metabolic Panel: Recent Labs  Lab 11/17/18 1414 11/18/18 0207 11/19/18 0416 11/20/18 0730  NA 131* 130* 127* 127*  K 3.3* 3.2* 3.4* 3.7  CL 93* 94* 92* 92*  CO2 21* 22 22 20*   GLUCOSE 182* 223* 317* 244*  BUN 20 30* 48* 62*  CREATININE 5.43* 6.67* 8.63* 10.55*  CALCIUM 9.7 8.8* 8.1* 7.7*  PHOS  --   --   --  3.7    Liver Function Tests: Recent Labs  Lab 11/20/18 0730  ALBUMIN 1.7*   No results for input(s): LIPASE, AMYLASE in the last 168 hours. No results for input(s): AMMONIA in the last 168 hours.  CBC: Recent Labs  Lab 11/17/18 1414 11/18/18 0207 11/19/18 0416 11/20/18 0729  WBC 44.0* 27.8* 20.0* 14.9*  HGB 8.8* 7.9* 8.4* 7.5*  HCT 27.2* 24.4* 25.9* 24.0*  MCV 78.6* 78.0* 77.5* 80.3  PLT 699* 662* 733* 754*    Cardiac Enzymes: No results for input(s): CKTOTAL, CKMB, CKMBINDEX, TROPONINI in the last 168 hours.  BNP: Invalid input(s): POCBNP  CBG: Recent Labs  Lab 11/21/18 0637 11/21/18 1132 11/21/18 1609 11/21/18 2122 11/22/18 0619  GLUCAP 298* 142* 275* 174* 280*    Microbiology: Results for orders placed or performed during the hospital encounter of 11/03/18  Blood Culture (routine x 2)     Status: Abnormal   Collection Time: 11/03/18  8:54 AM  Result Value Ref Range Status   Specimen Description BLOOD RIGHT HAND  Final   Special Requests   Final    BOTTLES DRAWN AEROBIC AND ANAEROBIC Blood Culture adequate volume   Culture  Setup Time   Final    GRAM POSITIVE COCCI IN BOTH AEROBIC  AND ANAEROBIC BOTTLES CRITICAL RESULT CALLED TO, READ BACK BY AND VERIFIED WITH: C AMEND PHARMD 11/03/18 2349 JDW Performed at [facility], [place] [address], [place], [place] [address]    Culture METHICILLIN RESISTANT STAPHYLOCOCCUS AUREUS (A)  Final   Report Status 11/06/2018 FINAL  Final   Organism ID, Bacteria METHICILLIN RESISTANT STAPHYLOCOCCUS AUREUS  Final      Susceptibility   Methicillin resistant staphylococcus aureus - MIC*    CIPROFLOXACIN >=8 RESISTANT Resistant     ERYTHROMYCIN >=8 RESISTANT Resistant     GENTAMICIN <=0.5 SENSITIVE Sensitive     OXACILLIN >=4 RESISTANT Resistant     TETRACYCLINE <=1 SENSITIVE  Sensitive     VANCOMYCIN 1 SENSITIVE Sensitive     TRIMETH/SULFA <=10 SENSITIVE Sensitive     CLINDAMYCIN <=0.25 SENSITIVE Sensitive     RIFAMPIN <=0.5 SENSITIVE Sensitive     Inducible Clindamycin NEGATIVE Sensitive     * METHICILLIN RESISTANT STAPHYLOCOCCUS AUREUS  Blood Culture ID Panel (Reflexed)     Status: Abnormal   Collection Time: 11/03/18  8:54 AM  Result Value Ref Range Status   Enterococcus species NOT DETECTED NOT DETECTED Final   Listeria monocytogenes NOT DETECTED NOT DETECTED Final   Staphylococcus species DETECTED (A) NOT DETECTED Final    Comment: CRITICAL RESULT CALLED TO, READ BACK BY AND VERIFIED WITH: C AMEND PHARMD 11/03/18 2349 JDW    Staphylococcus aureus (BCID) DETECTED (A) NOT DETECTED Final    Comment: Methicillin (oxacillin)-resistant Staphylococcus aureus (MRSA). MRSA is predictably resistant to beta-lactam antibiotics (except ceftaroline). Preferred therapy is vancomycin unless clinically contraindicated. Patient requires contact precautions if  hospitalized. CRITICAL RESULT CALLED TO, READ BACK BY AND VERIFIED WITH: C AMEND PHARMD 11/03/18 2349 JDW    Methicillin resistance DETECTED (A) NOT DETECTED Final    Comment: CRITICAL RESULT CALLED TO, READ BACK BY AND VERIFIED WITH: C AMEND PHARMD 11/03/18 2349 JDW    Streptococcus species NOT DETECTED NOT DETECTED Final   Streptococcus agalactiae NOT DETECTED NOT DETECTED Final   Streptococcus pneumoniae NOT DETECTED NOT DETECTED Final   Streptococcus pyogenes NOT DETECTED NOT DETECTED Final   Acinetobacter baumannii NOT DETECTED NOT DETECTED Final   Enterobacteriaceae species NOT DETECTED NOT DETECTED Final   Enterobacter cloacae complex NOT DETECTED NOT DETECTED Final   Escherichia coli NOT DETECTED NOT DETECTED Final   Klebsiella oxytoca NOT DETECTED NOT DETECTED Final   Klebsiella pneumoniae NOT DETECTED NOT DETECTED Final   Proteus species NOT DETECTED NOT DETECTED Final   Serratia marcescens NOT  DETECTED NOT DETECTED Final   Haemophilus influenzae NOT DETECTED NOT DETECTED Final   Neisseria meningitidis NOT DETECTED NOT DETECTED Final   Pseudomonas aeruginosa NOT DETECTED NOT DETECTED Final   Candida albicans NOT DETECTED NOT DETECTED Final   Candida glabrata NOT DETECTED NOT DETECTED Final   Candida krusei NOT DETECTED NOT DETECTED Final   Candida parapsilosis NOT DETECTED NOT DETECTED Final   Candida tropicalis NOT DETECTED NOT DETECTED Final    Comment: Performed at [facility], [place]. [address] [place], [place] [address]  Blood Culture (routine x 2)     Status: Abnormal   Collection Time: 11/03/18  9:42 PM  Result Value Ref Range Status   Specimen Description BLOOD RIGHT HAND  Final   Special Requests   Final    BOTTLES DRAWN AEROBIC ONLY Blood Culture results may not be optimal due to an inadequate volume of blood received in culture bottles   Culture  Setup Time  Final    AEROBIC BOTTLE ONLY GRAM POSITIVE COCCI CRITICAL VALUE NOTED.  VALUE IS CONSISTENT WITH PREVIOUSLY REPORTED AND CALLED VALUE. Performed at [facility], [place] [address] [place], [place] [address]    Culture (A)  Final    METHICILLIN RESISTANT STAPHYLOCOCCUS AUREUS CORRECTED ON 03/27 AT 6045: PREVIOUSLY REPORTED AS GRAM POSITIVE COCCI CORRECTED ON 03/26 AT 0124: PREVIOUSLY REPORTED AS NO GROWTH 5 DAYS   Report Status 11/11/2018 FINAL  Final   Organism ID, Bacteria METHICILLIN RESISTANT STAPHYLOCOCCUS AUREUS  Final      Susceptibility   Methicillin resistant staphylococcus aureus - MIC*    CIPROFLOXACIN >=8 RESISTANT Resistant     ERYTHROMYCIN >=8 RESISTANT Resistant     GENTAMICIN <=0.5 SENSITIVE Sensitive     OXACILLIN >=4 RESISTANT Resistant     TETRACYCLINE <=1 SENSITIVE Sensitive     VANCOMYCIN 1 SENSITIVE Sensitive     TRIMETH/SULFA <=10 SENSITIVE Sensitive     CLINDAMYCIN <=0.25 SENSITIVE Sensitive     RIFAMPIN <=0.5 SENSITIVE Sensitive     Inducible Clindamycin NEGATIVE  Sensitive     * METHICILLIN RESISTANT STAPHYLOCOCCUS AUREUS CORRECTED ON 03/27 AT 0734: PREVIOUSLY REPORTED AS GRAM POSITIVE COCCI CORRECTED ON 03/26 AT 0124: PREVIOUSLY REPORTED AS NO GROWTH 5 DAYS  Culture, blood (Routine X 2) w Reflex to ID Panel     Status: None   Collection Time: 11/04/18  2:50 AM  Result Value Ref Range Status   Specimen Description BLOOD RIGHT FOREARM  Final   Special Requests   Final    BOTTLES DRAWN AEROBIC AND ANAEROBIC Blood Culture adequate volume   Culture   Final    NO GROWTH 5 DAYS Performed at [facility], [place] [address] [place], [place] [address]    Report Status 11/09/2018 FINAL  Final  Culture, blood (Routine X 2) w Reflex to ID Panel     Status: None   Collection Time: 11/04/18 10:07 AM  Result Value Ref Range Status   Specimen Description BLOOD RIGHT ARM  Final   Special Requests   Final    BOTTLES DRAWN AEROBIC ONLY Blood Culture adequate volume   Culture   Final    NO GROWTH 5 DAYS Performed at [facility], [place] [address], [place], [place] [address]    Report Status 11/09/2018 FINAL  Final    Coagulation Studies: No results for input(s): LABPROT, INR in the last 72 hours.  Urinalysis: No results for input(s): COLORURINE, LABSPEC, PHURINE, GLUCOSEU, HGBUR, BILIRUBINUR, KETONESUR, PROTEINUR, UROBILINOGEN, NITRITE, LEUKOCYTESUR in the last 72 hours.  Invalid input(s): APPERANCEUR    Imaging: No results found.   Medications:   . sodium chloride    . magnesium sulfate 1 - 4 g bolus IVPB    . piperacillin-tazobactam (ZOSYN)  IV Stopped (11/21/18 2353)  . vancomycin     . aspirin EC  81 mg Oral Daily  . atorvastatin  80 mg Oral q1800  . calcitRIOL  1.25 mcg Oral Q M,W,F-HD  . carvedilol  6.25 mg Oral BID  . Chlorhexidine Gluconate Cloth  6 each Topical Q0600  . clopidogrel  75 mg Oral Daily  . docusate sodium  100 mg Oral Daily  . heparin  5,000 Units Subcutaneous Q8H  . insulin aspart  0-15 Units Subcutaneous TID  WC  . insulin glargine  14 Units Subcutaneous QHS  . isosorbide mononitrate  30 mg Oral Daily  . lanthanum  2,000 mg Oral TID WC  . multivitamin  1 tablet Oral QHS  . pantoprazole  40 mg Oral Daily  . sodium chloride flush  3 mL Intravenous Q12H  . 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  No growth and blood cultures  End-stage renal disease hemodialysis Monday Wednesday Friday ultrafiltration 4 L 11/20/2018.  He is receiving his dialysis 11/22/2018  Anemia decrease hemoglobin darbepoetin 11/22/2018 IV iron load will be held with infection.  Metabolic bone disease continue medications  Congestive heart failure with a history of systolic dysfunction EF 91% last 2D echo January 2020  Coronary artery disease status post CABG x3.   LOS: [place] @TODAY @8 :06 AM

## 2018-11-23 ENCOUNTER — Other Ambulatory Visit: Payer: Self-pay

## 2018-11-23 ENCOUNTER — Inpatient Hospital Stay (HOSPITAL_COMMUNITY)
Admission: RE | Admit: 2018-11-23 | Discharge: 2018-11-28 | DRG: 559 | Disposition: A | Discharge status: Home Health | Payer: Medicaid Other | Source: Intra-hospital | Attending: Physical Medicine & Rehabilitation | Admitting: Physical Medicine & Rehabilitation

## 2018-11-23 ENCOUNTER — Encounter (HOSPITAL_COMMUNITY): Payer: Self-pay

## 2018-11-23 DIAGNOSIS — Z7982 Long term (current) use of aspirin: Secondary | ICD-10-CM | POA: Diagnosis not present

## 2018-11-23 DIAGNOSIS — D62 Acute posthemorrhagic anemia: Secondary | ICD-10-CM | POA: Diagnosis not present

## 2018-11-23 DIAGNOSIS — R7881 Bacteremia: Secondary | ICD-10-CM | POA: Diagnosis present

## 2018-11-23 DIAGNOSIS — S88111A Complete traumatic amputation at level between knee and ankle, right lower leg, initial encounter: Secondary | ICD-10-CM | POA: Diagnosis not present

## 2018-11-23 DIAGNOSIS — M898X9 Other specified disorders of bone, unspecified site: Secondary | ICD-10-CM | POA: Diagnosis present

## 2018-11-23 DIAGNOSIS — E1122 Type 2 diabetes mellitus with diabetic chronic kidney disease: Secondary | ICD-10-CM | POA: Diagnosis present

## 2018-11-23 DIAGNOSIS — I132 Hypertensive heart and chronic kidney disease with heart failure and with stage 5 chronic kidney disease, or end stage renal disease: Secondary | ICD-10-CM | POA: Diagnosis present

## 2018-11-23 DIAGNOSIS — N186 End stage renal disease: Secondary | ICD-10-CM

## 2018-11-23 DIAGNOSIS — Z87891 Personal history of nicotine dependence: Secondary | ICD-10-CM | POA: Diagnosis not present

## 2018-11-23 DIAGNOSIS — D649 Anemia, unspecified: Secondary | ICD-10-CM | POA: Diagnosis present

## 2018-11-23 DIAGNOSIS — I2510 Atherosclerotic heart disease of native coronary artery without angina pectoris: Secondary | ICD-10-CM | POA: Diagnosis present

## 2018-11-23 DIAGNOSIS — K5900 Constipation, unspecified: Secondary | ICD-10-CM | POA: Diagnosis present

## 2018-11-23 DIAGNOSIS — Z89511 Acquired absence of right leg below knee: Secondary | ICD-10-CM | POA: Diagnosis not present

## 2018-11-23 DIAGNOSIS — Z8249 Family history of ischemic heart disease and other diseases of the circulatory system: Secondary | ICD-10-CM

## 2018-11-23 DIAGNOSIS — Z794 Long term (current) use of insulin: Secondary | ICD-10-CM

## 2018-11-23 DIAGNOSIS — Z4781 Encounter for orthopedic aftercare following surgical amputation: Principal | ICD-10-CM

## 2018-11-23 DIAGNOSIS — E785 Hyperlipidemia, unspecified: Secondary | ICD-10-CM | POA: Diagnosis present

## 2018-11-23 DIAGNOSIS — Z992 Dependence on renal dialysis: Secondary | ICD-10-CM | POA: Diagnosis not present

## 2018-11-23 DIAGNOSIS — G8918 Other acute postprocedural pain: Secondary | ICD-10-CM

## 2018-11-23 DIAGNOSIS — N2581 Secondary hyperparathyroidism of renal origin: Secondary | ICD-10-CM | POA: Diagnosis present

## 2018-11-23 DIAGNOSIS — Z7902 Long term (current) use of antithrombotics/antiplatelets: Secondary | ICD-10-CM

## 2018-11-23 DIAGNOSIS — E119 Type 2 diabetes mellitus without complications: Secondary | ICD-10-CM | POA: Diagnosis not present

## 2018-11-23 DIAGNOSIS — Z89512 Acquired absence of left leg below knee: Secondary | ICD-10-CM | POA: Diagnosis not present

## 2018-11-23 DIAGNOSIS — Z951 Presence of aortocoronary bypass graft: Secondary | ICD-10-CM

## 2018-11-23 DIAGNOSIS — I5042 Chronic combined systolic (congestive) and diastolic (congestive) heart failure: Secondary | ICD-10-CM | POA: Diagnosis present

## 2018-11-23 DIAGNOSIS — E1142 Type 2 diabetes mellitus with diabetic polyneuropathy: Secondary | ICD-10-CM

## 2018-11-23 LAB — GLUCOSE, CAPILLARY
Glucose-Capillary: 221 mg/dL — ABNORMAL HIGH (ref 70–99)
Glucose-Capillary: 245 mg/dL — ABNORMAL HIGH (ref 70–99)
Glucose-Capillary: 269 mg/dL — ABNORMAL HIGH (ref 70–99)
Glucose-Capillary: 219 mg/dL — ABNORMAL HIGH (ref 70–99)

## 2018-11-23 MED ORDER — INSULIN ASPART 100 UNIT/ML ~~LOC~~ SOLN
0.0000 [IU] | Freq: Three times a day (TID) | SUBCUTANEOUS | Status: DC
  Administered 2018-11-23 – 2018-11-25 (×3): 5 [IU] via SUBCUTANEOUS
  Administered 2018-11-25 – 2018-11-26 (×4): 1 [IU] via SUBCUTANEOUS
  Administered 2018-11-26: 3 [IU] via SUBCUTANEOUS
  Administered 2018-11-27 – 2018-11-28 (×2): 1 [IU] via SUBCUTANEOUS

## 2018-11-23 MED ORDER — RENA-VITE PO TABS
1.0000 | ORAL_TABLET | Freq: Every day | ORAL | Status: DC
  Administered 2018-11-23 – 2018-11-27 (×5): 1 via ORAL
  Filled 2018-11-23 (×5): qty 1

## 2018-11-23 MED ORDER — VANCOMYCIN VARIABLE DOSE PER UNSTABLE RENAL FUNCTION (PHARMACIST DOSING): Status: DC

## 2018-11-23 MED ORDER — ISOSORBIDE MONONITRATE ER 30 MG PO TB24
30.0000 mg | ORAL_TABLET | Freq: Every day | ORAL | Status: DC
  Administered 2018-11-24 – 2018-11-27 (×4): 30 mg via ORAL
  Filled 2018-11-23 (×4): qty 1

## 2018-11-23 MED ORDER — INSULIN ASPART 100 UNIT/ML ~~LOC~~ SOLN
3.0000 [IU] | Freq: Three times a day (TID) | SUBCUTANEOUS | Status: DC
  Administered 2018-11-23: 3 [IU] via SUBCUTANEOUS

## 2018-11-23 MED ORDER — INSULIN ASPART 100 UNIT/ML ~~LOC~~ SOLN
0.0000 [IU] | Freq: Three times a day (TID) | SUBCUTANEOUS | Status: DC
  Administered 2018-11-23: 3 [IU] via SUBCUTANEOUS

## 2018-11-23 MED ORDER — HEPARIN SODIUM (PORCINE) 5000 UNIT/ML IJ SOLN: 5000.0000 [IU] | Freq: Three times a day (TID) | INTRAMUSCULAR | Status: DC

## 2018-11-23 MED ORDER — VANCOMYCIN HCL 1000 MG IV SOLR
1000.0000 mg | INTRAVENOUS | Status: DC
  Administered 2018-11-24: 1000 mg via INTRAVENOUS
  Filled 2018-11-23: qty 1000

## 2018-11-23 MED ORDER — LANTHANUM CARBONATE 500 MG PO CHEW
2000.0000 mg | CHEWABLE_TABLET | Freq: Three times a day (TID) | ORAL | Status: DC
  Administered 2018-11-24 – 2018-11-25 (×3): 2000 mg via ORAL
  Filled 2018-11-23 (×16): qty 4

## 2018-11-23 MED ORDER — BISACODYL 10 MG RE SUPP: 10.0000 mg | Freq: Every day | RECTAL | Status: DC | PRN

## 2018-11-23 MED ORDER — INSULIN ASPART 100 UNIT/ML ~~LOC~~ SOLN
3.0000 [IU] | Freq: Three times a day (TID) | SUBCUTANEOUS | Status: DC
  Administered 2018-11-23 – 2018-11-28 (×12): 3 [IU] via SUBCUTANEOUS

## 2018-11-23 MED ORDER — LIDOCAINE HCL (PF) 1 % IJ SOLN: 5.0000 mL | INTRAMUSCULAR | Status: DC | PRN

## 2018-11-23 MED ORDER — PENTAFLUOROPROP-TETRAFLUOROETH EX AERO: 1.0000 "application " | INHALATION_SPRAY | CUTANEOUS | Status: DC | PRN

## 2018-11-23 MED ORDER — DOCUSATE SODIUM 100 MG PO CAPS
100.0000 mg | ORAL_CAPSULE | Freq: Every day | ORAL | Status: DC
  Administered 2018-11-24 – 2018-11-27 (×4): 100 mg via ORAL
  Filled 2018-11-23 (×4): qty 1

## 2018-11-23 MED ORDER — POLYETHYLENE GLYCOL 3350 17 G PO PACK: 17.0000 g | PACK | Freq: Every day | ORAL | Status: DC | PRN

## 2018-11-23 MED ORDER — SORBITOL 70 % SOLN: 30.0000 mL | Freq: Every day | Status: DC | PRN

## 2018-11-23 MED ORDER — PANTOPRAZOLE SODIUM 40 MG PO TBEC
40.0000 mg | DELAYED_RELEASE_TABLET | Freq: Every day | ORAL | Status: DC
  Administered 2018-11-24 – 2018-11-27 (×4): 40 mg via ORAL
  Filled 2018-11-23 (×4): qty 1

## 2018-11-23 MED ORDER — MORPHINE SULFATE 15 MG PO TABS
15.0000 mg | ORAL_TABLET | ORAL | Status: DC | PRN
  Administered 2018-11-23 – 2018-11-28 (×18): 15 mg via ORAL
  Filled 2018-11-23 (×18): qty 1

## 2018-11-23 MED ORDER — ATORVASTATIN CALCIUM 80 MG PO TABS
80.0000 mg | ORAL_TABLET | Freq: Every day | ORAL | Status: DC
  Administered 2018-11-23 – 2018-11-27 (×4): 80 mg via ORAL
  Filled 2018-11-23 (×4): qty 1

## 2018-11-23 MED ORDER — INSULIN GLARGINE 100 UNIT/ML ~~LOC~~ SOLN: 14.0000 [IU] | Freq: Every day | SUBCUTANEOUS | Status: DC

## 2018-11-23 MED ORDER — ASPIRIN EC 81 MG PO TBEC
81.0000 mg | DELAYED_RELEASE_TABLET | Freq: Every day | ORAL | Status: DC
  Administered 2018-11-24 – 2018-11-27 (×4): 81 mg via ORAL
  Filled 2018-11-23 (×4): qty 1

## 2018-11-23 MED ORDER — LIDOCAINE-PRILOCAINE 2.5-2.5 % EX CREA
1.0000 "application " | TOPICAL_CREAM | CUTANEOUS | Status: DC | PRN
  Filled 2018-11-23: qty 5

## 2018-11-23 MED ORDER — HEPARIN SODIUM (PORCINE) 5000 UNIT/ML IJ SOLN
5000.0000 [IU] | Freq: Three times a day (TID) | INTRAMUSCULAR | Status: DC
  Filled 2018-11-23 (×6): qty 1

## 2018-11-23 MED ORDER — INSULIN GLARGINE 100 UNIT/ML ~~LOC~~ SOLN
14.0000 [IU] | Freq: Every day | SUBCUTANEOUS | Status: DC
  Administered 2018-11-23 – 2018-11-27 (×5): 14 [IU] via SUBCUTANEOUS
  Filled 2018-11-23 (×6): qty 0.14

## 2018-11-23 MED ORDER — CLOPIDOGREL BISULFATE 75 MG PO TABS
75.0000 mg | ORAL_TABLET | Freq: Every day | ORAL | Status: DC
  Administered 2018-11-24 – 2018-11-27 (×4): 75 mg via ORAL
  Filled 2018-11-23 (×4): qty 1

## 2018-11-23 MED ORDER — ACETAMINOPHEN 650 MG RE SUPP: 325.0000 mg | RECTAL | Status: DC | PRN

## 2018-11-23 MED ORDER — CARVEDILOL 6.25 MG PO TABS
6.2500 mg | ORAL_TABLET | Freq: Two times a day (BID) | ORAL | Status: DC
  Administered 2018-11-23 – 2018-11-27 (×9): 6.25 mg via ORAL
  Filled 2018-11-23 (×9): qty 1

## 2018-11-23 MED ORDER — ACETAMINOPHEN 325 MG PO TABS
325.0000 mg | ORAL_TABLET | ORAL | Status: DC | PRN
  Administered 2018-11-26 – 2018-11-27 (×2): 650 mg via ORAL
  Filled 2018-11-23 (×2): qty 2

## 2018-11-23 MED ORDER — CALCITRIOL 0.25 MCG PO CAPS
1.2500 ug | ORAL_CAPSULE | ORAL | Status: DC
  Administered 2018-11-24 – 2018-11-27 (×3): 1.25 ug via ORAL
  Filled 2018-11-23: qty 5

## 2018-11-23 NOTE — Progress Notes (Addendum)
  Progress Note    11/23/2018 7:19 AM 6 Days Post-Op  Subjective:  Sleeping; says he has a little bit of pain  Afebrile  Vitals:   11/22/18 2013 11/23/18 0606  BP: 120/82 (!) 115/44  Pulse: 90 79  Resp: 18 13  Temp: 98.6 F (37 C) 98.9 F (37.2 C)  SpO2:      Physical Exam: Incisions:  Stump sock in place-clean and dry   CBC    Component Value Date/Time   WBC 10.0 11/22/2018 0840   RBC 2.89 (L) 11/22/2018 0840   HGB 7.0 (L) 11/22/2018 0840   HGB 14.3 02/07/2018 0937   HCT 23.5 (L) 11/22/2018 0840   HCT 42.8 02/07/2018 0937   PLT 761 (H) 11/22/2018 0840   PLT 369 02/07/2018 0937   MCV 81.3 11/22/2018 0840   MCV 82 02/07/2018 0937   MCH 24.2 (L) 11/22/2018 0840   MCHC 29.8 (L) 11/22/2018 0840   RDW 18.7 (H) 11/22/2018 0840   RDW 21.5 (H) 02/07/2018 0937   LYMPHSABS 1.3 11/03/2018 0848   MONOABS 1.0 11/03/2018 0848   EOSABS 0.1 11/03/2018 0848   BASOSABS 0.0 11/03/2018 0848    BMET    Component Value Date/Time   NA 126 (L) 11/22/2018 0840   NA 133 (L) 02/07/2018 0937   K 3.5 11/22/2018 0840   CL 92 (L) 11/22/2018 0840   CO2 22 11/22/2018 0840   GLUCOSE 175 (H) 11/22/2018 0840   GLUCOSE 262 03/06/2008   BUN 49 (H) 11/22/2018 0840   BUN 37 (H) 02/07/2018 0937   CREATININE 9.41 (H) 11/22/2018 0840   CALCIUM 6.9 (L) 11/22/2018 0840   GFRNONAA 6 (L) 11/22/2018 0840   GFRAA 7 (L) 11/22/2018 0840    INR    Component Value Date/Time   INR 1.25 11/04/2017 1450     Intake/Output Summary (Last 24 hours) at 11/23/2018 0719 Last data filed at 11/22/2018 2123 Gross per 24 hour  Intake 3 ml  Output 5001 ml  Net -4998 ml     Assessment/Plan:  43 y.o. male is s/p right below knee amputation  6 Days Post-Op  -hopefully for CIR today -f/u with Dr. Carlis Abbott in 4 weeks for staple removal.     Leontine Locket, PA-C [facility] [phone] 11/23/2018 7:19 AM  I have seen and evaluated the patient. I agree with the PA note as documented  above. R BKA healing well.  Hopefully CIR if patient willing to do therapy.    [name], MD [facility] Office: [phone] Pager: [phone]

## 2018-11-23 NOTE — Progress Notes (Signed)
Kirsteins, Luanna Salk, MD  Physician  Physical Medicine and Rehabilitation  PMR Pre-admission  Signed  Date of Service:  11/23/2018 10:56 AM       Related encounter: Admission (Discharged) from 11/17/2018 in [facility] 4E CV SURGICAL PROGRESSIVE CARE      Signed         Show:Clear all '[x]' Manual'[x]' Template'[x]' Copied  Added by: '[x]' Cristina Gong, RN'[x]' Kirsteins, Luanna Salk, MD  '[]' Hover for details PMR Admission Coordinator Pre-Admission Assessment  Patient: [name]. is an 43 y.o., male MRN: [id number] DOB: [birth year] Height: '6\' 2"'  (188 cm) Weight: 70.9 kg  Insurance Information HMO:    PPO:      PCP:      IPA:      80/20:      OTHER:  PRIMARY: Medicaid of [place]      Policy#: [id number]      Subscriber: pt Benefits:  Phone #: passport one online     Name: 11/20/2018 Eff. Date: active MADCY       On hemodialysis since 2007; not eligible for Medicare due to worked quarters  [employer] Application Date:       Case Manager:  Disability Application Date:       Case Worker:   The "Data Collection Information Summary" for patients in Inpatient Rehabilitation Facilities with attached "Privacy Act [place] Records" was provided and verbally reviewed with: Patient  Emergency Contact Information         Contact Information    Name Relation Home Work Mobile   [place] Sister [phone]  [phone]   [place] other [phone]        Current Medical History  Patient Admitting Diagnosis: Right BKA; 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  Used a left prosthesis in wheelchair.  Presented 11/17/2018 with noted multiple revascularization  procedures of right lower extremity and right second toe amputation and poor healing. 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 Monica Martinez. Hospital course pain management. Patient remains on vancomycin through 12/16/2018 for blood cultures back on 11/03/2018 that were positive. Hemodialysis ongoing as per renal services. Acute on chronic anemia 7.0 and monitored. Subcutaneous for DVT prophylaxis.  Patient was placed on contact droplet precautions on 4/6 due to his girlfriend admitted for R/O Covid. Precautions removed on 11/22/2018 for girlfriend was negative COVID 19. She is currently hospitalized also. She was admitted 11/19/2018. She is also dialysis patient.  Patient's medical record from [facility]  has been reviewed by the rehabilitation admission coordinator and physician.  Past Medical History      Past Medical History:  Diagnosis Date  . Anemia   . Atherosclerosis of lower extremity ([place])   . Blood clot in vein    right calf  . CAD (coronary artery disease)   . Cataracts, bilateral   . Chronic combined systolic and diastolic heart failure ([place])   . Complication of anesthesia   . Depression   . ESRD (end stage renal disease) on dialysis [facility])    "MWF [employer]" (03/08/2017)  . GERD (gastroesophageal reflux disease)   . Heart murmur   . History of blood transfusion    "related to OR"  . Hypertension   . Myocardial infarction ([place])     "  light"  . Nonhealing surgical wound    nonviable tissue  . PONV (postoperative nausea and vomiting)   . S/P unilateral BKA (below knee amputation), left ([place])   . Type II diabetes mellitus ([place])   . Wears glasses     Family History   family history includes Heart failure in his mother; Hypertension in his mother.  Prior Rehab/Hospitalizations Has the patient had prior rehab or hospitalizations prior to admission? Yes SNF after first BKA at [facility] for  15 months. He did not d/c home with girlfriend at that time for she was in subsidized housing and he was not on the lease. Patient's apartment currently is subsidized and he and his girlfriend are on the lease now.  Has the patient had major surgery during 100 days prior to admission? Yes             Current Medications  Current Facility-Administered Medications:  .  0.9 %  sodium chloride infusion, 250 mL, Intravenous, PRN, Rhyne, Samantha J, PA-C .  acetaminophen (TYLENOL) tablet 325-650 mg, 325-650 mg, Oral, Q4H PRN **OR** acetaminophen (TYLENOL) suppository 325-650 mg, 325-650 mg, Rectal, Q4H PRN, Rhyne, Samantha J, PA-C .  aspirin EC tablet 81 mg, 81 mg, Oral, Daily, Rhyne, Samantha J, PA-C, 81 mg at 11/23/18 1024 .  atorvastatin (LIPITOR) tablet 80 mg, 80 mg, Oral, q1800, Rhyne, Samantha J, PA-C, 80 mg at 11/22/18 1725 .  bisacodyl (DULCOLAX) suppository 10 mg, 10 mg, Rectal, Daily PRN, Rhyne, Samantha J, PA-C .  calcitRIOL (ROCALTROL) capsule 1.25 mcg, 1.25 mcg, Oral, Q M,W,F-HD, Rhyne, Samantha J, PA-C, 1.25 mcg at 11/22/18 1135 .  carvedilol (COREG) tablet 6.25 mg, 6.25 mg, Oral, BID, Rhyne, Samantha J, PA-C, 6.25 mg at 11/23/18 1024 .  Chlorhexidine Gluconate Cloth 2 % PADS 6 each, 6 each, Topical, Q0600, Edrick Oh, MD, 6 each at 11/21/18 1610 .  clopidogrel (PLAVIX) tablet 75 mg, 75 mg, Oral, Daily, Rhyne, Samantha J, PA-C, 75 mg at 11/23/18 1023 .  docusate sodium (COLACE) capsule 100 mg, 100 mg, Oral, Daily, Rhyne, Samantha J, PA-C, 100 mg at 11/23/18 1023 .  guaiFENesin-dextromethorphan (ROBITUSSIN DM) 100-10 MG/5ML syrup 15 mL, 15 mL, Oral, Q4H PRN, Rhyne, Samantha J, PA-C .  heparin injection 5,000 Units, 5,000 Units, Subcutaneous, Q8H, Rhyne, Samantha J, PA-C, 5,000 Units at 11/19/18 0645 .  hydrALAZINE (APRESOLINE) injection 5 mg, 5 mg, Intravenous, Q20 Min PRN, Rhyne, Samantha J, PA-C .  insulin aspart (novoLOG) injection 0-9 Units, 0-9 Units, Subcutaneous, TID WC, Rhyne,  Samantha J, PA-C .  insulin aspart (novoLOG) injection 3 Units, 3 Units, Subcutaneous, TID WC, Rhyne, Samantha J, PA-C .  insulin glargine (LANTUS) injection 14 Units, 14 Units, Subcutaneous, QHS, Rhyne, Hulen Shouts, PA-C, 14 Units at 11/22/18 2122 .  isosorbide mononitrate (IMDUR) 24 hr tablet 30 mg, 30 mg, Oral, Daily, Rhyne, Samantha J, PA-C, 30 mg at 11/23/18 1024 .  labetalol (NORMODYNE,TRANDATE) injection 10 mg, 10 mg, Intravenous, Q10 min PRN, Rhyne, Samantha J, PA-C .  lanthanum (FOSRENOL) chewable tablet 2,000 mg, 2,000 mg, Oral, TID WC, Rhyne, Samantha J, PA-C, 2,000 mg at 11/23/18 1023 .  magnesium sulfate IVPB 2 g 50 mL, 2 g, Intravenous, Daily PRN, Rhyne, Samantha J, PA-C .  metoprolol tartrate (LOPRESSOR) injection 2-5 mg, 2-5 mg, Intravenous, Q2H PRN, Rhyne, Samantha J, PA-C .  morphine (MSIR) tablet 15 mg, 15 mg, Oral, Q4H PRN, Rhyne, Samantha J, PA-C, 15 mg at 11/23/18 1024 .  morphine 2 MG/ML injection 2 mg, 2 mg,  Intravenous, Q2H PRN, Gabriel Earing, PA-C, 2 mg at 11/20/18 2154 .  multivitamin (RENA-VIT) tablet 1 tablet, 1 tablet, Oral, QHS, Rhyne, Samantha J, PA-C, 1 tablet at 11/22/18 2123 .  ondansetron (ZOFRAN) injection 4 mg, 4 mg, Intravenous, Q6H PRN, Rhyne, Samantha J, PA-C .  pantoprazole (PROTONIX) EC tablet 40 mg, 40 mg, Oral, Daily, Rhyne, Samantha J, PA-C, 40 mg at 11/23/18 1023 .  phenol (CHLORASEPTIC) mouth spray 1 spray, 1 spray, Mouth/Throat, PRN, Rhyne, Samantha J, PA-C .  piperacillin-tazobactam (ZOSYN) IVPB 3.375 g, 3.375 g, Intravenous, Q12H, Alvira Philips, [place], Last Rate: 12.5 mL/hr at 11/23/18 1035, 3.375 g at 11/23/18 1035 .  polyethylene glycol (MIRALAX / GLYCOLAX) packet 17 g, 17 g, Oral, Daily PRN, Rhyne, Samantha J, PA-C .  sodium chloride flush (NS) 0.9 % injection 3 mL, 3 mL, Intravenous, Q12H, Rhyne, Samantha J, PA-C, 3 mL at 11/23/18 1025 .  sodium chloride flush (NS) 0.9 % injection 3 mL, 3 mL, Intravenous, PRN, Rhyne, Samantha J, PA-C .   vancomycin (VANCOCIN) 1,000 mg in sodium chloride 0.9 % 250 mL IVPB, 1,000 mg, Intravenous, Q M,W,F-HD, Kris Mouton, RPH .  vancomycin variable dose per unstable renal function (pharmacist dosing), , Does not apply, See admin instructions, Reginia Naas, RPH  Patients Current Diet:     Diet Order                  Diet renal/carb modified with fluid restriction Diet-HS Snack? Nothing; Fluid restriction: 1200 mL Fluid; Room service appropriate? Yes; Fluid consistency: Thin  Diet effective now               Precautions / Restrictions Precautions Precautions: Fall Precaution Comments: Prior Left BKA; left prosthetic. Recent R BKA.  Restrictions Weight Bearing Restrictions: Yes RLE Weight Bearing: Non weight bearing   Has the patient had 2 or more falls or a fall with injury in the past year? No  Prior Activity Level Limited Community (1-2x/wk): to outpatietn hemodislyais 3 times per week  Prior Functional Level Self Care: Did the patient need help bathing, dressing, using the toilet or eating? Independent  Indoor Mobility: Did the patient need assistance with walking from room to room (with or without device)? Independent  Stairs: Did the patient need assistance with internal or external stairs (with or without device)? Needed some help  Functional Cognition: Did the patient need help planning regular tasks such as shopping or remembering to take medications? Needed some help  Home Assistive Devices / [place] Devices/Equipment: Wheelchair, Environmental consultant (specify type), Cane (specify quad or straight), CBG Meter Home Equipment: Grab bars - toilet, Grab bars - tub/shower, Wheelchair - manual, Walker - 2 wheels, Crutches, Tub bench, Cane - single point  Prior Device Use: Indicate devices/aids used by the patient prior to current illness, exacerbation or injury? Manual wheelchair and [place] of Left prosthetic LE  Current Functional Level  Cognition  Overall Cognitive Status: Within Functional Limits for tasks assessed Orientation Level: Oriented X4    Extremity Assessment (includes Sensation/Coordination)  Upper Extremity Assessment: Overall WFL for tasks assessed  Lower Extremity Assessment: Generalized weakness RLE Deficits / Details: s/p R BKA RLE: Unable to fully assess due to pain LLE Deficits / Details: Left BKA    ADLs  Overall ADL's : Needs assistance/impaired Eating/Feeding: Independent, Sitting Grooming: Set up, Sitting, Oral care, Wash/dry face, Supervision/safety Grooming Details (indicate cue type and reason): seated EOB, setup assist and supervision for sitting balance Upper Body Bathing:  Min guard, Sitting Lower Body Bathing: Moderate assistance, +2 for physical assistance, Sit to/from stand Upper Body Dressing : Min guard, Sitting Upper Body Dressing Details (indicate cue type and reason): Min Guard for safety with sitting balance Lower Body Dressing: Moderate assistance, +2 for physical assistance, Sit to/from stand Lower Body Dressing Details (indicate cue type and reason): Pt donning his prosthetic on LLE and MIn Guard A for safety with sitting balance. Mod A +2 for sit<>stand Toilet Transfer: Minimal assistance, +2 for safety/equipment(Lateral scoot to drop arm recliner) Functional mobility during ADLs: Moderate assistance, +2 for physical assistance, Rolling walker, +2 for safety/equipment(sit<>stand Mod A +2; lateral scoot with Min A) General ADL Comments: pt with painful RLE, educated in desensitization techniques as well as strategies to promote stretching in RLE to prevent further contracture    Mobility  Overal bed mobility: Needs Assistance Bed Mobility: Supine to Sit, Sit to Supine Supine to sit: Min guard Sit to supine: Min guard, Supervision General bed mobility comments: minguard for general safety and lines; pt able to perform transitions without physical assist, reports he has  been transitioning to sitting EOB to eat his meals    Transfers  Overall transfer level: Needs assistance Equipment used: Rolling walker (2 wheeled) Transfers: Sit to/from Stand, Lateral/Scoot Transfers Sit to Stand: Mod assist, +2 physical assistance, From elevated surface  Lateral/Scoot Transfers: Min assist, +2 safety/equipment General transfer comment: pt request to defer today due to pain    Ambulation / Gait / Stairs / Office manager / Balance Dynamic Sitting Balance Sitting balance - Comments: Able to maintain static sitting balance with supervision today Balance Overall balance assessment: Needs assistance Sitting-balance support: No upper extremity supported, Feet supported Sitting balance-Leahy Scale: Fair Sitting balance - Comments: Able to maintain static sitting balance with supervision today Standing balance support: Bilateral upper extremity supported, During functional activity Standing balance-Leahy Scale: Poor Standing balance comment: Reliant on UE support    Special needs/care consideration BiPAP/CPAP n/a CPM  N/a Continuous Drip IV  N/a Dialysis yes        Days MWF [place]; ride [employer] transportation Life Vest  N/a Oxygen  N/a Special Bed  N/a Trach Size  N/a Wound Vac n/a Skin  Surgical incision with staples Bowel mgmt:  Continent LBM 4/8 Bladder mgmt:  anuric Diabetic mgmt: yes  Behavioral consideration  N/a Chemo/radiation  N/a   Previous Home Environment  Living Arrangements: Spouse/significant other(girlfriend, Brittney)  Lives With: Significant other Available Help at Discharge: Personal care attendant(PCS aide 2 pm until 7 pm monday through Friday; GF hospitali) Type of Home: Apartment Home Layout: One level Home Access: Level entry Bathroom Shower/Tub: Chiropodist: Handicapped height Bathroom Accessibility: Yes How Accessible: Accessible via wheelchair Home Care Services: Yes  Additional Comments: Wheelchair accessible apartment  Discharge Living Setting Plans for Discharge Living Setting: Apartment, Lives with (comment)(girlfriend, Bartholome Bill who is hospitalized also) Type of Home at Discharge: Apartment Discharge Home Layout: One level Discharge Home Access: Level entry Discharge Bathroom Shower/Tub: Tub/shower unit, Curtain Discharge Bathroom Toilet: Handicapped height Discharge Bathroom Accessibility: Yes How Accessible: Accessible via wheelchair Does the patient have any problems obtaining your medications?: No  Social/Family/Support Systems Patient Roles: Parent, Partner(2 children who do not live with him) Contact Information: sister, Khaza Blansett for his giflrfriend is hospitalized Anticipated Caregiver: sister and niece intermittent Anticipated Caregiver's Contact Information: sister [phone] Ability/Limitations of Caregiver: sister is a Marine scientist who works and is  also on dialysis; niece lives near by and works but can check in on him Caregiver Availability: Intermittent Discharge Plan Discussed with Primary Caregiver: Yes Is Caregiver In Agreement with Plan?: Yes Does Caregiver/Family have Issues with Lodging/Transportation while Pt is in Rehab?: No  Goals/Additional Needs Patient/Family Goal for Rehab: Mod I wheelchair level PT, Mod I to supervision OT Expected length of stay: ELOS 7 to 10 days Special Service Needs: Hemodialysis MWF [place]; rides [employer] transportation to dialysis Pt/Family Agrees to Admission and willing to participate: Yes Program Orientation Provided & Reviewed with Pt/Caregiver Including Roles  & Responsibilities: Yes  Decrease burden of Care through IP rehab admission: n/a  Possible need for SNF placement upon discharge: not anticipated  Patient Condition: I have reviewed medical records from [facility] , spoken with CM, and patient. I met with patient at the bedside for inpatient  rehabilitation assessment.  Patient will benefit from ongoing PT and OT, can actively participate in 3 hours of therapy a day 5 days of the week, and can make measurable gains during the admission.  Patient will also benefit from the coordinated team approach during an Inpatient Acute Rehabilitation admission.  The patient will receive intensive therapy as well as Rehabilitation physician, nursing, social worker, and care management interventions.  Due to bowel management, safety, skin/wound care, disease management, medication administration, pain management and patient education the patient requires 24 hour a day rehabilitation nursing.  The patient is currently min to mod assist with mobility and basic ADLs.  Discharge setting and therapy post discharge at home with home health is anticipated.  Patient has agreed to participate in the Acute Inpatient Rehabilitation Program and will admit today.  Preadmission Screen Completed By:  Annamary Rummage MSN 11/23/2018 11:07 AM ______________________________________________________________________   Discussed status with Dr. Letta Pate on  11/23/2018 at 1108 and received approval for admission today.  Admission Coordinator:  Cleatrice Burke, RN MSN time 2703 Date 11/23/2018   Assessment/Plan: Diagnosis: Right below-knee amputation for 11/17/2018 1. Does the need for close, 24 hr/day Medical supervision in concert with the patient's rehab needs make it unreasonable for this patient to be served in a less intensive setting? Yes 2. Co-Morbidities requiring supervision/potential complications: End-stage renal disease, peripheral vascular disease, diabetes, hypertension 3. Due to bowel management, safety, skin/wound care, disease management, medication administration, pain management and patient education, does the patient require 24 hr/day rehab nursing? Yes 4. Does the patient require coordinated care of a physician, rehab nurse, PT (1-2 hrs/day, 5  days/week) and OT (1-2 hrs/day, 5 days/week) to address physical and functional deficits in the context of the above medical diagnosis(es)? Yes Addressing deficits in the following areas: balance, endurance, locomotion, strength, transferring, bowel/bladder control, bathing, dressing, feeding, grooming, toileting, cognition and psychosocial support 5. Can the patient actively participate in an intensive therapy program of at least 3 hrs of therapy 5 days a week? Yes 6. The potential for patient to make measurable gains while on inpatient rehab is good 7. Anticipated functional outcomes upon discharge from inpatients are: modified independent and Wheelchair level PT, modified independent and Wheelchair level OT, n/a SLP 8. Estimated rehab length of stay to reach the above functional goals is: 7-10d 9. Anticipated D/C setting: Home 10. Anticipated post D/C treatments: [place] therapy 11. Overall Rehab/Functional Prognosis: excellent  MD Signature: Charlett Blake M.D. [facility] FAAPM&R (Sports Med, Neuromuscular Med) Diplomate Am Board of Electrodiagnostic Med  Revision History

## 2018-11-23 NOTE — IPOC Note (Signed)
Overall Plan of Care (IPOC) Patient Details Name: [name] Say Sr. MRN: [id number] DOB: 02/16/1976  Admitting Diagnosis: Right BKA with history of left BKA  Hospital Problems: Active Problems:   S/P bilateral below knee amputation ([place])   Unilateral complete BKA, right, initial encounter ([place])   Diabetes mellitus type 2 in nonobese ([place])   Bacteremia   Postoperative pain     Functional Problem List: Nursing Motor, Pain, Safety, Skin Integrity  PT Balance, Endurance, Motor, Pain, Edema, Sensory, Skin Integrity  OT Balance, Endurance, Pain  SLP    TR         Basic ADL's: OT Grooming, Bathing, Dressing, Toileting     Advanced  ADL's: OT Simple Meal Preparation, Light Housekeeping     Transfers: PT Bed Mobility, Bed to Chair, Car, Manufacturing systems engineer, Metallurgist: PT Wheelchair Mobility     Additional Impairments: OT    SLP        TR      Anticipated Outcomes Item Anticipated Outcome  Self Feeding    Swallowing      Basic self-care  mod I  Toileting  mod I   Bathroom Transfers mod I  Bowel/Bladder  Continue to be continent of bowel and bladder during admission  Transfers  mod I  Locomotion   mod I w/c level  Communication     Cognition     Pain  Patient will be pain free or pain less than 3.  Safety/Judgment  Patient will be free from falls and adhere to safety plan during admission   Therapy Plan: PT Intensity: Minimum of 1-2 x/day ,45 to 90 minutes PT Frequency: 5 out of 7 days PT Duration Estimated Length of Stay: 5 days OT Intensity: Minimum of 1-2 x/day, 45 to 90 minutes OT Frequency: 5 out of 7 days OT Duration/Estimated Length of Stay: 5-7 days      Team Interventions: Nursing Interventions Patient/Family Education, Pain Management, Skin Care/Wound Management, Psychosocial Support, Disease Management/Prevention  PT interventions Community reintegration, DME/adaptive equipment instruction, Neuromuscular  re-education, Stair training, UE/LE Strength taining/ROM, Wheelchair propulsion/positioning, UE/LE Coordination activities, Therapeutic Activities, Pain management, Discharge planning, Training and development officer, Functional mobility training, Patient/family education, Splinting/orthotics, Therapeutic Exercise  OT Interventions Balance/vestibular training, Discharge planning, Pain management, Self Care/advanced ADL retraining, Therapeutic Activities, Functional mobility training, Patient/family education, Skin care/wound managment, Therapeutic Exercise, Community reintegration, DME/adaptive equipment instruction  SLP Interventions    TR Interventions    SW/CM Interventions Discharge Planning, Psychosocial Support, Patient/Family Education   Barriers to Discharge MD  Medical stability, Wound care and Weight bearing restrictions  Nursing      PT Decreased caregiver support    OT      SLP      SW Decreased caregiver support Caregiver is hosptialized at this time-Brittany   Team Discharge Planning: Destination: PT-Home ,OT- Home , SLP-  Projected Follow-up: PT-Home health PT, OT-  Home health OT, SLP-  Projected Equipment Needs: PT-None recommended by PT, OT-  , SLP-  Equipment Details: PT- , OT-owns shower bench (fold down style), handicap height toilet, grab bars at shower and toilet,  Patient/family involved in discharge planning: PT- Patient,  OT-Patient, SLP-   MD ELOS: 3-6 days. Medical Rehab Prognosis:  Good Assessment: 43 year old right handed African-American male with history of CAD/CABG maintained on aspirin and Plavix, end-stage renal disease with hemodialysis Monday Wednesday Friday, diabetes mellitus, hypertension,left BKA July 2018 discharged to skilled nursing facility. Used a left prosthesis in wheelchair.  Presented 11/17/2018 with noted multiple revascularization procedures of right lower extremity and right second toe amputation and poor healing. Limb was not felt to be  salvageable and underwent right BKA 11/17/2018 per Dr. Monica Martinez. Hospital course pain management. Patient remains on vancomycin through 12/16/2018 for blood cultures back on 11/03/2018 that were positive. Hemodialysis ongoing as per renal services. Acute on chronic anemia monitored.  Patient with resultant functional deficits with mobility, transfers, self-care.  We will set goals for mod I at wheelchair level with PT/OT.   Due to the current state of emergency, patients may not be receiving their 3-hours of Medicare-mandated therapy.  See Team Conference Notes for weekly updates to the plan of care

## 2018-11-23 NOTE — Progress Notes (Signed)
Patient arrived on unit with staff. He was comfortable laying in bed. He did rate pain 8/10. Incision site looks good but tender. Patient had no questions at this time.

## 2018-11-23 NOTE — H&P (Signed)
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 Per chart review patient lives subsidized housing. Has a home health aid 5 hours a day. Used a left prosthesis in wheelchair. He has a girlfriend good support as well as a sister and niece in the area. Presented 11/17/2018 with noted multiple revascularization procedures of right lower extremity and right second toe amputation and poor healing. 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 Monica Martinez. Hospital course pain management. Patient remains on vancomycin through 12/16/2018 for blood cultures back on 11/03/2018 that were positive. Hemodialysis ongoing as per renal services. Acute on chronic anemia 7.0 and monitored. Subcutaneous for DVT prophylaxis.Therapy evaluations completed and patient was admitted for a comprehensive rehabilitation program.   Review of Systems  Constitutional: Positive for fever. Negative for chills.  HENT: Negative for hearing loss.   Eyes: Negative for blurred vision and double vision.  Respiratory: Positive for shortness of breath.   Cardiovascular: Positive for leg swelling. Negative for chest pain and palpitations.  Gastrointestinal: Positive for constipation. Negative for heartburn, nausea and vomiting.       GERD  Genitourinary: Negative for dysuria and flank pain.  Musculoskeletal: Positive for joint pain and myalgias.  Skin: Negative for rash.  All other systems reviewed and are negative.       Past Medical History:  Diagnosis Date  . Anemia    . Atherosclerosis of lower extremity ([place])    . Blood clot in vein      right calf  . CAD (coronary  artery disease)    . Cataracts, bilateral    . Chronic combined systolic and diastolic heart failure ([place])    . Complication of anesthesia    . Depression    . ESRD (end stage renal disease) on dialysis [facility])      "MWF [employer]" (03/08/2017)  . GERD (gastroesophageal reflux disease)    . Heart murmur    . History of blood transfusion      "related to OR"  . Hypertension    . Myocardial infarction ([place])       " light"  . Nonhealing surgical wound      nonviable tissue  . PONV (postoperative nausea and vomiting)    . S/P unilateral BKA (below knee amputation), left ([place])    . Type II diabetes mellitus ([place])    . Wears glasses           Past Surgical History:  Procedure Laterality Date  . ABDOMINAL AORTOGRAM N/A 11/02/2016    Procedure: Abdominal Aortogram;  Surgeon: Waynetta Sandy, MD;  Location: [place] CV [facility];  Service: Cardiovascular;  Laterality: N/A;  . ABDOMINAL AORTOGRAM W/LOWER EXTREMITY Right 08/07/2018    Procedure: ABDOMINAL AORTOGRAM W/LOWER EXTREMITY;  Surgeon: [name], MD;  Location: [place] [facility];  Service: Cardiovascular;  Laterality: Right;  . AMPUTATION Left 09/27/2013    Procedure: LEFT GREAT TOE AMPUTATION;  Surgeon: [name], MD;  Location: [place];  Service: Orthopedics;  Laterality: Left;  . AMPUTATION Right 08/15/2015    Procedure: Right Great Toe Amputation;  Surgeon: [name], MD;  Location: [place];  Service: Orthopedics;  Laterality: Right;  .  AMPUTATION Left 11/05/2016    Procedure: TRANSMETATARSAL AMPUTATION LEFT FOOT;  Surgeon: [name], MD;  Location: [place];  Service: Orthopedics;  Laterality: Left;  . AMPUTATION Left 03/11/2017    Procedure: LEFT BELOW KNEE AMPUTATION;  Surgeon: [name], MD;  Location: [place];  Service: Orthopedics;  Laterality: Left;  . AMPUTATION Right 03/11/2017    Procedure: RIGHT 2ND TOE AMPUTATION;  Surgeon: [name], MD;  Location: [place];  Service: Orthopedics;  Laterality:  Right;  . AMPUTATION Right 11/17/2018    Procedure: AMPUTATION BELOW KNEE;  Surgeon: [name], MD;  Location: [place];  Service: Vascular;  Laterality: Right;  . AV FISTULA PLACEMENT   left arm  . CORONARY ARTERY BYPASS GRAFT N/A 11/04/2017    Procedure: CORONARY ARTERY BYPASS GRAFTING (CABG) x three, using left internal mammary artery and right    leg greater saphenous vein;  Surgeon: Melrose Nakayama, MD;  Location: [place];  Service: Open Heart Surgery;  Laterality: N/A;  . FASCIOTOMY Right 08/07/2018    Procedure: FOUR COMPARTMENT FASCIOTOMY OF RIGHT LOWER LEG;  Surgeon: Rosetta Posner, MD;  Location: [place];  Service: Vascular;  Laterality: Right;  . FASCIOTOMY CLOSURE Right 08/10/2018    Procedure: FASCIOTOMY CLOSURE RIGHT LOWER EXTREMITY;  Surgeon: [name], MD;  Location: [place];  Service: Vascular;  Laterality: Right;  . HEMATOMA EVACUATION Right 08/07/2018    Procedure: EVACUATION HEMATOMA;  Surgeon: Rosetta Posner, MD;  Location: [place];  Service: Vascular;  Laterality: Right;  . LEFT HEART CATH AND CORONARY ANGIOGRAPHY N/A 10/31/2017    Procedure: LEFT HEART CATH AND CORONARY ANGIOGRAPHY;  Surgeon: Troy Sine, MD;  Location: [place] [facility];  Service: Cardiovascular;  Laterality: N/A;  . LEFT HEART CATHETERIZATION WITH CORONARY ANGIOGRAM N/A 09/13/2014    Procedure: LEFT HEART CATHETERIZATION WITH CORONARY ANGIOGRAM;  Surgeon: Sinclair Grooms, MD;  Location: [facility] C[facility] LAB;  Service: Cardiovascular;  Laterality: N/A;  . LOWER EXTREMITY ANGIOGRAPHY Bilateral 11/02/2016    Procedure: Lower Extremity Angiography;  Surgeon: Waynetta Sandy, MD;  Location: [place] [facility];  Service: Cardiovascular;  Laterality: Bilateral;  . PERIPHERAL VASCULAR ATHERECTOMY Left 11/02/2016    Procedure: Peripheral Vascular Atherectomy;  Surgeon: Waynetta Sandy, MD;  Location: [place] CV LAB;  Service: Cardiovascular;  Laterality: Left;  PERONEAL  . PERIPHERAL  VASCULAR ATHERECTOMY Right 08/07/2018    Procedure: PERIPHERAL VASCULAR ATHERECTOMY;  Surgeon: [name], MD;  Location: [place] [facility] LAB;  Service: Cardiovascular;  Laterality: Right;  Peroneal artery  . PERIPHERAL VASCULAR BALLOON ANGIOPLASTY Right 08/07/2018    Procedure: PERIPHERAL VASCULAR BALLOON ANGIOPLASTY;  Surgeon: [name], MD;  Location: [place] [facility];  Service: Cardiovascular;  Laterality: Right;  anterior tibial artery  . STUMP REVISION Left 01/11/2017    Procedure: Revision Left Transmetatarsal Amputation;  Surgeon: [name], MD;  Location: [place];  Service: Orthopedics;  Laterality: Left;  . TEE WITHOUT CARDIOVERSION N/A 11/04/2017    Procedure: TRANSESOPHAGEAL ECHOCARDIOGRAM (TEE);  Surgeon: Melrose Nakayama, MD;  Location: [place];  Service: Open Heart Surgery;  Laterality: N/A;  . TRANSMETATARSAL AMPUTATION Right 08/10/2018    Procedure: RIGHT FIFTH TOE AMPUTATION;  Surgeon: [name], MD;  Location: [place];  Service: Vascular;  Laterality: Right;  . TRANSMETATARSAL AMPUTATION Right 09/14/2018    Procedure: TRANSMETATARSAL AMPUTATION;  Surgeon: [name], MD;  Location: [place];  Service: Vascular;  Laterality:  Right;         Family History  Problem Relation Age of Onset  . Heart failure Mother    . Hypertension Mother      Social History:  reports that he quit smoking about 4 weeks ago. His smoking use included cigarettes. He has a 13.00 pack-year smoking history. He has never used smokeless tobacco. He reports previous alcohol use. He reports previous drug use. Drug: Marijuana. Allergies:  Allergies  Allergen Reactions  . Coconut Oil Anaphylaxis and Other (See Comments)      Can use topically, allergic to coconut foods           Medications Prior to Admission  Medication Sig Dispense Refill  . acetaminophen (TYLENOL) 325 MG tablet Take 2 tablets (650 mg total) by mouth every 4 (four) hours as needed for headache  or mild pain.      Marland Kitchen aspirin EC 81 MG tablet Take 1 tablet (81 mg total) by mouth daily. 30 tablet 11  . atorvastatin (LIPITOR) 80 MG tablet Take 1 tablet (80 mg total) by mouth daily at 6 PM. 30 tablet 0  . calcitRIOL (ROCALTROL) 0.25 MCG capsule Take 5 capsules (1.25 mcg total) by mouth every Monday, Wednesday, and Friday with hemodialysis. 30 capsule 1  . carvedilol (COREG) 6.25 MG tablet Take 1 tablet (6.25 mg total) by mouth 2 (two) times daily for 30 days. 60 tablet 0  . clopidogrel (PLAVIX) 75 MG tablet Take 1 tablet (75 mg total) by mouth daily. 30 tablet 6  . isosorbide mononitrate (IMDUR) 30 MG 24 hr tablet Take 1 tablet (30 mg total) by mouth daily. 30 tablet 0  . lanthanum (FOSRENOL) 1000 MG chewable tablet Chew 2 tablets (2,000 mg total) by mouth 3 (three) times daily with meals. 180 tablet 0  . morphine (MSIR) 15 MG tablet Take 15 mg by mouth every 4 (four) hours as needed for severe pain.       . multivitamin (RENA-VIT) TABS tablet Take 1 tablet by mouth at bedtime. 30 tablet 0  . ondansetron (ZOFRAN) 4 MG tablet Take 4 mg by mouth every 6 (six) hours as needed for nausea or vomiting.      . vancomycin 1,000 mg in sodium chloride 0.9 % 250 mL Inject 1,000 mg into the vein every Monday, Wednesday, and Friday with hemodialysis. For six weeks      . [DISCONTINUED] insulin glargine (LANTUS) 100 UNIT/ML injection Inject 0.1 mLs (10 Units total) into the skin at bedtime. (Patient taking differently: Inject 14 Units into the skin at bedtime. ) 10 mL 11      Drug Regimen Review  Drug regimen was reviewed and remains appropriate with no significant issues identified   Home: Home Living Family/patient expects to be discharged to:: Private residence Living Arrangements: Spouse/significant other(Girlfriend staying with him) Available Help at Discharge: Personal care attendant, Available 24 hours/day, Friend(s), Available PRN/intermittently Type of Home: Apartment Home Access: Level entry  Home Layout: One level Bathroom Shower/Tub: Chiropodist: Handicapped height Home Equipment: Grab bars - toilet, Grab bars - tub/shower, Wheelchair - manual, Environmental consultant - 2 wheels, Crutches, Tub bench, Cane - single point Additional Comments: Wheelchair accessible apartment   Functional History: Prior Function Level of Independence: Needs assistance Gait / Transfers Assistance Needed: Primarily using w/c for mobility due to R foot pain and "they told me not to be on my foot so it can heal." Mod indep with w/c transfers. Transport to HD ADL's / [employer]  Needed: PCA 5 day/wk for 5 hrs/day to assist with household tasks (cooking, cleaning). Pt report mod indep with ADLs   Functional Status:  Mobility: Bed Mobility Overal bed mobility: Needs Assistance Bed Mobility: Supine to Sit, Sit to Supine Supine to sit: Min guard Sit to supine: Min guard, Supervision General bed mobility comments: minguard for general safety and lines; pt able to perform transitions without physical assist, reports he has been transitioning to sitting EOB to eat his meals Transfers Overall transfer level: Needs assistance Equipment used: Rolling walker (2 wheeled) Transfers: Sit to/from Stand, Lateral/Scoot Transfers Sit to Stand: Mod assist, +2 physical assistance, From elevated surface  Lateral/Scoot Transfers: Min assist, +2 safety/equipment General transfer comment: pt request to defer today due to pain   ADL: ADL Overall ADL's : Needs assistance/impaired Eating/Feeding: Independent, Sitting Grooming: Set up, Sitting, Oral care, Wash/dry face, Supervision/safety Grooming Details (indicate cue type and reason): seated EOB, setup assist and supervision for sitting balance Upper Body Bathing: Min guard, Sitting Lower Body Bathing: Moderate assistance, +2 for physical assistance, Sit to/from stand Upper Body Dressing : Min guard, Sitting Upper Body Dressing Details (indicate cue  type and reason): Min Guard for safety with sitting balance Lower Body Dressing: Moderate assistance, +2 for physical assistance, Sit to/from stand Lower Body Dressing Details (indicate cue type and reason): Pt donning his prosthetic on LLE and MIn Guard A for safety with sitting balance. Mod A +2 for sit<>stand Toilet Transfer: Minimal assistance, +2 for safety/equipment(Lateral scoot to drop arm recliner) Functional mobility during ADLs: Moderate assistance, +2 for physical assistance, Rolling walker, +2 for safety/equipment(sit<>stand Mod A +2; lateral scoot with Min A) General ADL Comments: pt with painful RLE, educated in desensitization techniques as well as strategies to promote stretching in RLE to prevent further contracture   Cognition: Cognition Overall Cognitive Status: Within Functional Limits for tasks assessed Orientation Level: Oriented X4 Cognition Arousal/Alertness: Awake/alert Behavior During Therapy: Flat affect Overall Cognitive Status: Within Functional Limits for tasks assessed   Physical Exam: Blood pressure (!) 115/44, pulse 79, temperature 98.9 F (37.2 C), temperature source Oral, resp. rate 13, height 6\' 2"  (1.88 m), weight 70.9 kg, SpO2 97 %. Physical Exam  HENT:  Head: Normocephalic.  Eyes: EOM are normal.  Neck: Normal range of motion. Neck supple. No thyromegaly present.  Cardiovascular: Normal rate and regular rhythm.  Respiratory: Effort normal and breath sounds normal.  GI: Soft. Bowel sounds are normal. He exhibits no distension.  Neurological:  Patient is alert. Mood is flat but appropriate. Follows commands. [place].  Skin:  Right BKA site is dressed. Left BKA well-healed   Motor strength is 5/5 bilateral deltoid, bicep, tricep, grip as well as left hip flexion Right hip flexion not tested secondary to recent surgery in the right lower extremity.  Patient does have pain in that area Sensation intact to light touch bilateral upper  limbs MSK good range of motion bilateral upper limbs he has good knee and hip range of motion on the left side No evidence of edema in the left BKA   Lab Results Last 48 Hours        Results for orders placed or performed during the hospital encounter of 11/17/18 (from the past 48 hour(s))  Glucose, capillary     Status: Abnormal    Collection Time: 11/21/18 11:32 AM  Result Value Ref Range    Glucose-Capillary 142 (H) 70 - 99 mg/dL  Glucose, capillary     Status: Abnormal  Collection Time: 11/21/18  4:09 PM  Result Value Ref Range    Glucose-Capillary 275 (H) 70 - 99 mg/dL  Glucose, capillary     Status: Abnormal    Collection Time: 11/21/18  9:22 PM  Result Value Ref Range    Glucose-Capillary 174 (H) 70 - 99 mg/dL  Glucose, capillary     Status: Abnormal    Collection Time: 11/22/18  6:19 AM  Result Value Ref Range    Glucose-Capillary 280 (H) 70 - 99 mg/dL  Renal function panel     Status: Abnormal    Collection Time: 11/22/18  8:40 AM  Result Value Ref Range    Sodium 126 (L) 135 - 145 mmol/L    Potassium 3.5 3.5 - 5.1 mmol/L    Chloride 92 (L) 98 - 111 mmol/L    CO2 22 22 - 32 mmol/L    Glucose, Bld 175 (H) 70 - 99 mg/dL    BUN 49 (H) 6 - 20 mg/dL    Creatinine, Ser 9.41 (H) 0.61 - 1.24 mg/dL    Calcium 6.9 (L) 8.9 - 10.3 mg/dL    Phosphorus 3.5 2.5 - 4.6 mg/dL    Albumin 1.7 (L) 3.5 - 5.0 g/dL    GFR calc non Af Amer 6 (L) >60 mL/min    GFR calc Af Amer 7 (L) >60 mL/min    Anion gap 12 5 - 15      Comment: Performed at [facility], [address], [place], [place] [address]  CBC     Status: Abnormal    Collection Time: 11/22/18  8:40 AM  Result Value Ref Range    WBC 10.0 4.0 - 10.5 K/uL    RBC 2.89 (L) 4.22 - 5.81 MIL/uL    Hemoglobin 7.0 (L) 13.0 - 17.0 g/dL    HCT 23.5 (L) 39.0 - 52.0 %    MCV 81.3 80.0 - 100.0 fL    MCH 24.2 (L) 26.0 - 34.0 pg    MCHC 29.8 (L) 30.0 - 36.0 g/dL    RDW 18.7 (H) 11.5 - 15.5 %    Platelets 761 (H) 150 - 400 K/uL     nRBC 0.0 0.0 - 0.2 %      Comment: Performed at [facility] [address], [place], [place] [address]  Glucose, capillary     Status: Abnormal    Collection Time: 11/22/18  1:12 PM  Result Value Ref Range    Glucose-Capillary 191 (H) 70 - 99 mg/dL  Glucose, capillary     Status: Abnormal    Collection Time: 11/22/18  4:49 PM  Result Value Ref Range    Glucose-Capillary 287 (H) 70 - 99 mg/dL  Glucose, capillary     Status: Abnormal    Collection Time: 11/22/18  9:08 PM  Result Value Ref Range    Glucose-Capillary 213 (H) 70 - 99 mg/dL  Glucose, capillary     Status: Abnormal    Collection Time: 11/23/18  6:04 AM  Result Value Ref Range    Glucose-Capillary 221 (H) 70 - 99 mg/dL      Imaging Results (Last 48 hours)  No results found.           Medical Problem List and Plan: 1.  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  Antithrombotics: -DVT/anticoagulation:  Subcutaneous heparin.             -  antiplatelet therapy: aspirin 81 mg daily, Plavix 75 mg daily 3. Pain Management: MSIR 15 mg every 4 hours as needed 4. Mood: Provide emotional support             -antipsychotic agents: N/A 5. Neuropsych: This patient is capable of making decisions on his own behalf. 6. Skin/Wound Care: routine skin checks 7. Fluids/Electrolytes/Nutrition: routine ins and outs with follow-up chemistries 8. End-stage renal disease with hemodialysis. Follow-up renal services 9. Acute on chronic anemia. Follow-up CBC 10. ID. Continue vancomycin through 12/16/2018 for positive blood cultures 11/03/2018 11. Diabetes mellitus.NovoLog 3 units 3 times a day with meals, Lantus insulin 14 units daily at bedtime 12. CAD with CABG. Continue aspirin and Plavix.Imdur 30 mg daily, Coreg 6.25 mg twice a day 13. Hyperlipidemia. Lipitor 14. Constipation. Laxative assistance     Post Admission Physician Evaluation: 1.  Functional deficits secondary  to Right BKA 11/17/2018, Left BKA 2018. 2. Patient admitted to receive collaborative, interdisciplinary care between the physiatrist, rehab nursing staff, and therapy team. 3. Patient's level of medical complexity and substantial therapy needs in context of that medical necessity cannot be provided at a lesser intensity of care. 4. Patient has experienced substantial functional loss from his/her baseline.Judging by the patient's diagnosis, physical exam, and functional history, the patient has potential for functional progress which will result in measurable gains while on inpatient rehab.  These gains will be of substantial and practical use upon discharge in facilitating mobility and self-care at the household level. 5. Physiatrist will provide 24 hour management of medical needs as well as oversight of the therapy plan/treatment and provide guidance as appropriate regarding the interaction of the two. 6. 24 hour rehab nursing will assist in the management of  bladder management, bowel management, safety, skin/wound care, disease management, medication administration, pain management and patient education  and help integrate therapy concepts, techniques,education, etc. 7. PT will assess and treat for:pre gait, gait training, endurance , safety, equipment, neuromuscular re education  .  Goals are: independent with assistive device. 8. OT will assess and treat for ADLs, Cognitive perceptual skills, Neuromuscular re education, safety, endurance, equipment  .  Goals are: independent with assistive device.  9. SLP will assess and treat for  .  Goals are: N/A. 10. Case Management and Social Worker will assess and treat for psychological issues and discharge planning. 11. Team conference will be held weekly to assess progress toward goals and to determine barriers to discharge. 12.  Patient will receive at least 3 hours of therapy per day at least 5 days per week. 13. ELOS and  Prognosis: 7-10d excellent  "I have personally performed a face to face diagnostic evaluation of this patient.  Additionally, I have reviewed and concur with the physician assistant's documentation above."  Charlett Blake M.D. [facility] FAAPM&R (Sports Med, Neuromuscular Med) Diplomate Am Board of Electrodiagnostic Med      Cathlyn Parsons, PA-C 11/23/2018

## 2018-11-23 NOTE — TOC Transition Note (Signed)
Transition of Care [facility]) - CM/SW Discharge Note Marvetta Gibbons RN, BSN Transitions of Care Unit 4E- RN Case Manager [phone]  Patient Details  Name: [name] Cherian Sr. MRN: [id number] Date of Birth: [birth year]  Transition of Care [facility]) CM/SW Contact:  [name], RN Phone Number: [phone] 11/23/2018, 10:04 AM   Clinical Narrative:    Pt admitted with infected TMA, s/p BKA, PTA admission pt was home and active with [facility] needs, has w/c at home.  CIR consulted for possible admission, pt agreeable to IP rehab and bed has been offered for admission on 4/9- plan if for pt to transition to [facility] IP rehab on 4/9 per [facility]- admissions coordinator.    Final next level of care: IP Rehab Facility Barriers to Discharge: No Barriers Identified   Patient Goals and CMS Choice Patient states their goals for this hospitalization and ongoing recovery are:: "I want to do rehab" CMS Medicare.gov Compare Post Acute Care list provided to:: Patient Choice offered to / list presented to : Patient  Discharge Placement  [facility] IP rehab- then home following rehab.                      Discharge Plan and Services   Discharge Planning Services: CM Consult Post Acute Care Choice: IP Rehab                    Social Determinants of Health (SDOH) Interventions     Readmission Risk Interventions Readmission Risk Prevention Plan 11/23/2018 11/07/2018  Transportation Screening Complete Complete  Medication Review (RN Care Manager) - Complete  PCP or Specialist appointment within 3-5 days of discharge Complete Not Complete  PCP/Specialist Appt Not Complete comments - Specialist appointment scheduled on 12/12/2018 by the physician  [place] or Home Care Consult Complete Complete  SW Recovery Care/Counseling Consult Complete Complete  Palliative Care Screening Not Applicable Not Applicable  Skilled Nursing Facility Complete Complete  Some recent data might be hidden

## 2018-11-23 NOTE — Progress Notes (Signed)
Dialysis Coordinator informed patient's OP HD clinic, [place], of his admission to CIR.   Terri Piedra [facility] Dialysis Cooridnator [phone]

## 2018-11-23 NOTE — PMR Pre-admission (Signed)
PMR Admission Coordinator Pre-Admission Assessment  Patient: [name] Viscomi Sr. is an 43 y.o., male MRN: [id number] DOB: 12/02/1975 Height: _0  (188 cm) Weight: 70.9 kg  Insurance Information HMO:    PPO:      PCP:      IPA:      80/20:      OTHER:  PRIMARY: Medicaid [place]      Policy#: [id number]      Subscriber: pt Benefits:  Phone #: passport one online     Name: 11/20/2018 Eff. Date: active MADCY       On hemodialysis since 2007; not eligible for Medicare due to worked quarters  [employer] Application Date:       Case Manager:  Disability Application Date:       Case Worker:   The "Data Collection Information Summary" for patients in Inpatient Rehabilitation Facilities with attached "Privacy Act [place] Records" was provided and verbally reviewed with: Patient  Emergency Contact Information Contact Information    Name Relation Home Work Mobile   [place] Sister [phone]  [phone]   [place] other [phone]        Current Medical History  Patient Admitting Diagnosis: Right BKA; 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  Used a left prosthesis in wheelchair.  Presented 11/17/2018 with noted multiple revascularization procedures of right lower extremity and right second toe amputation and poor healing. 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 Monica Martinez. Hospital course pain management. Patient remains on vancomycin through 12/16/2018 for blood cultures back on 11/03/2018 that were positive. Hemodialysis ongoing as per renal services. Acute on chronic anemia 7.0 and monitored. Subcutaneous for DVT  prophylaxis.  Patient was placed on contact droplet precautions on 4/6 due to his girlfriend admitted for R/O Covid. Precautions removed on 11/22/2018 for girlfriend was negative COVID 19. She is currently hospitalized also. She was admitted 11/19/2018. She is also dialysis patient.  Patient's medical record from [facility]  has been reviewed by the rehabilitation admission coordinator and physician.  Past Medical History  Past Medical History:  Diagnosis Date  . Anemia   . Atherosclerosis of lower extremity ([place])   . Blood clot in vein    right calf  . CAD (coronary artery disease)   . Cataracts, bilateral   . Chronic combined systolic and diastolic heart failure ([place])   . Complication of anesthesia   . Depression   . ESRD (end stage renal disease) on dialysis [facility])    "MWF [employer]" (03/08/2017)  . GERD (gastroesophageal reflux disease)   . Heart murmur   . History of blood transfusion    "related to OR"  . Hypertension   . Myocardial infarction ([place])     " light"  . Nonhealing surgical wound    nonviable tissue  . PONV (postoperative nausea and vomiting)   . S/P unilateral BKA (below knee amputation), left ([place])   . Type II diabetes mellitus ([place])   . Wears glasses     Family History   family history includes Heart failure in his mother; Hypertension in his mother.  Prior Rehab/Hospitalizations Has the patient had prior rehab or hospitalizations prior to admission? Yes SNF after first [place] at [facility]  for 15 months. He did not d/c home with girlfriend at that time for she was in subsidized housing and he was not on the lease. Patient's apartment currently is subsidized and he and his girlfriend are on the lease now.  Has the patient had major surgery during 100 days prior to admission? Yes   Current Medications  Current Facility-Administered Medications:  .  0.9 %  sodium chloride infusion, 250 mL, Intravenous, PRN, Rhyne, Samantha J, PA-C .  acetaminophen  (TYLENOL) tablet 325-650 mg, 325-650 mg, Oral, Q4H PRN **OR** acetaminophen (TYLENOL) suppository 325-650 mg, 325-650 mg, Rectal, Q4H PRN, Rhyne, Samantha J, PA-C .  aspirin EC tablet 81 mg, 81 mg, Oral, Daily, Rhyne, Samantha J, PA-C, 81 mg at 11/23/18 1024 .  atorvastatin (LIPITOR) tablet 80 mg, 80 mg, Oral, q1800, Rhyne, Samantha J, PA-C, 80 mg at 11/22/18 1725 .  bisacodyl (DULCOLAX) suppository 10 mg, 10 mg, Rectal, Daily PRN, Rhyne, Samantha J, PA-C .  calcitRIOL (ROCALTROL) capsule 1.25 mcg, 1.25 mcg, Oral, Q M,W,F-HD, Rhyne, Samantha J, PA-C, 1.25 mcg at 11/22/18 1135 .  carvedilol (COREG) tablet 6.25 mg, 6.25 mg, Oral, BID, Rhyne, Samantha J, PA-C, 6.25 mg at 11/23/18 1024 .  Chlorhexidine Gluconate Cloth 2 % PADS 6 each, 6 each, Topical, Q0600, Edrick Oh, MD, 6 each at 11/21/18 1610 .  clopidogrel (PLAVIX) tablet 75 mg, 75 mg, Oral, Daily, Rhyne, Samantha J, PA-C, 75 mg at 11/23/18 1023 .  docusate sodium (COLACE) capsule 100 mg, 100 mg, Oral, Daily, Rhyne, Samantha J, PA-C, 100 mg at 11/23/18 1023 .  guaiFENesin-dextromethorphan (ROBITUSSIN DM) 100-10 MG/5ML syrup 15 mL, 15 mL, Oral, Q4H PRN, Rhyne, Samantha J, PA-C .  heparin injection 5,000 Units, 5,000 Units, Subcutaneous, Q8H, Rhyne, Samantha J, PA-C, 5,000 Units at 11/19/18 0645 .  hydrALAZINE (APRESOLINE) injection 5 mg, 5 mg, Intravenous, Q20 Min PRN, Rhyne, Samantha J, PA-C .  insulin aspart (novoLOG) injection 0-9 Units, 0-9 Units, Subcutaneous, TID WC, Rhyne, Samantha J, PA-C .  insulin aspart (novoLOG) injection 3 Units, 3 Units, Subcutaneous, TID WC, Rhyne, Samantha J, PA-C .  insulin glargine (LANTUS) injection 14 Units, 14 Units, Subcutaneous, QHS, Rhyne, Hulen Shouts, PA-C, 14 Units at 11/22/18 2122 .  isosorbide mononitrate (IMDUR) 24 hr tablet 30 mg, 30 mg, Oral, Daily, Rhyne, Samantha J, PA-C, 30 mg at 11/23/18 1024 .  labetalol (NORMODYNE,TRANDATE) injection 10 mg, 10 mg, Intravenous, Q10 min PRN, Rhyne, Samantha J,  PA-C .  lanthanum (FOSRENOL) chewable tablet 2,000 mg, 2,000 mg, Oral, TID WC, Rhyne, Samantha J, PA-C, 2,000 mg at 11/23/18 1023 .  magnesium sulfate IVPB 2 g 50 mL, 2 g, Intravenous, Daily PRN, Rhyne, Samantha J, PA-C .  metoprolol tartrate (LOPRESSOR) injection 2-5 mg, 2-5 mg, Intravenous, Q2H PRN, Rhyne, Samantha J, PA-C .  morphine (MSIR) tablet 15 mg, 15 mg, Oral, Q4H PRN, Rhyne, Samantha J, PA-C, 15 mg at 11/23/18 1024 .  morphine 2 MG/ML injection 2 mg, 2 mg, Intravenous, Q2H PRN, Rhyne, Samantha J, PA-C, 2 mg at 11/20/18 2154 .  multivitamin (RENA-VIT) tablet 1 tablet, 1 tablet, Oral, QHS, Rhyne, Samantha J, PA-C, 1 tablet at 11/22/18 2123 .  ondansetron (ZOFRAN) injection 4 mg, 4 mg, Intravenous, Q6H PRN, Rhyne, Samantha J, PA-C .  pantoprazole (PROTONIX) EC tablet 40 mg, 40 mg, Oral, Daily, Rhyne, Samantha J, PA-C, 40 mg at 11/23/18 1023 .  phenol (CHLORASEPTIC) mouth spray 1 spray, 1 spray, Mouth/Throat, PRN, Rhyne, Samantha J, PA-C .  piperacillin-tazobactam (ZOSYN) IVPB 3.375 g, 3.375  g, Intravenous, Q12H, Alvira Philips, [place], Last Rate: 12.5 mL/hr at 11/23/18 1035, 3.375 g at 11/23/18 1035 .  polyethylene glycol (MIRALAX / GLYCOLAX) packet 17 g, 17 g, Oral, Daily PRN, Rhyne, Samantha J, PA-C .  sodium chloride flush (NS) 0.9 % injection 3 mL, 3 mL, Intravenous, Q12H, Rhyne, Samantha J, PA-C, 3 mL at 11/23/18 1025 .  sodium chloride flush (NS) 0.9 % injection 3 mL, 3 mL, Intravenous, PRN, Rhyne, Samantha J, PA-C .  vancomycin (VANCOCIN) 1,000 mg in sodium chloride 0.9 % 250 mL IVPB, 1,000 mg, Intravenous, Q M,W,F-HD, Kris Mouton, RPH .  vancomycin variable dose per unstable renal function (pharmacist dosing), , Does not apply, See admin instructions, Reginia Naas, RPH  Patients Current Diet:  Diet Order            Diet renal/carb modified with fluid restriction Diet-HS Snack? Nothing; Fluid restriction: 1200 mL Fluid; Room service appropriate? Yes; Fluid consistency:  Thin  Diet effective now              Precautions / Restrictions Precautions Precautions: Fall Precaution Comments: Prior Left BKA; left prosthetic. Recent R BKA.  Restrictions Weight Bearing Restrictions: Yes RLE Weight Bearing: Non weight bearing   Has the patient had 2 or more falls or a fall with injury in the past year? No  Prior Activity Level Limited Community (1-2x/wk): to outpatietn hemodislyais 3 times per week  Prior Functional Level Self Care: Did the patient need help bathing, dressing, using the toilet or eating? Independent  Indoor Mobility: Did the patient need assistance with walking from room to room (with or without device)? Independent  Stairs: Did the patient need assistance with internal or external stairs (with or without device)? Needed some help  Functional Cognition: Did the patient need help planning regular tasks such as shopping or remembering to take medications? Needed some help  Home Assistive Devices / [place] Devices/Equipment: Wheelchair, Environmental consultant (specify type), Cane (specify quad or straight), CBG Meter Home Equipment: Grab bars - toilet, Grab bars - tub/shower, Wheelchair - manual, Walker - 2 wheels, Crutches, Tub bench, Cane - single point  Prior Device Use: Indicate devices/aids used by the patient prior to current illness, exacerbation or injury? Manual wheelchair and [place] of Left prosthetic LE  Current Functional Level Cognition  Overall Cognitive Status: Within Functional Limits for tasks assessed Orientation Level: Oriented X4    Extremity Assessment (includes Sensation/Coordination)  Upper Extremity Assessment: Overall WFL for tasks assessed  Lower Extremity Assessment: Generalized weakness RLE Deficits / Details: s/p R BKA RLE: Unable to fully assess due to pain LLE Deficits / Details: Left BKA    ADLs  Overall ADL's : Needs assistance/impaired Eating/Feeding: Independent, Sitting Grooming: Set up, Sitting,  Oral care, Wash/dry face, Supervision/safety Grooming Details (indicate cue type and reason): seated EOB, setup assist and supervision for sitting balance Upper Body Bathing: Min guard, Sitting Lower Body Bathing: Moderate assistance, +2 for physical assistance, Sit to/from stand Upper Body Dressing : Min guard, Sitting Upper Body Dressing Details (indicate cue type and reason): Min Guard for safety with sitting balance Lower Body Dressing: Moderate assistance, +2 for physical assistance, Sit to/from stand Lower Body Dressing Details (indicate cue type and reason): Pt donning his prosthetic on LLE and MIn Guard A for safety with sitting balance. Mod A +2 for sit<>stand Toilet Transfer: Minimal assistance, +2 for safety/equipment(Lateral scoot to drop arm recliner) Functional mobility during ADLs: Moderate assistance, +2 for physical assistance, Rolling walker, +  2 for safety/equipment(sit<>stand Mod A +2; lateral scoot with Min A) General ADL Comments: pt with painful RLE, educated in desensitization techniques as well as strategies to promote stretching in RLE to prevent further contracture    Mobility  Overal bed mobility: Needs Assistance Bed Mobility: Supine to Sit, Sit to Supine Supine to sit: Min guard Sit to supine: Min guard, Supervision General bed mobility comments: minguard for general safety and lines; pt able to perform transitions without physical assist, reports he has been transitioning to sitting EOB to eat his meals    Transfers  Overall transfer level: Needs assistance Equipment used: Rolling walker (2 wheeled) Transfers: Sit to/from Stand, Lateral/Scoot Transfers Sit to Stand: Mod assist, +2 physical assistance, From elevated surface  Lateral/Scoot Transfers: Min assist, +2 safety/equipment General transfer comment: pt request to defer today due to pain    Ambulation / Gait / Stairs / Office manager / Balance Dynamic Sitting Balance Sitting balance  - Comments: Able to maintain static sitting balance with supervision today Balance Overall balance assessment: Needs assistance Sitting-balance support: No upper extremity supported, Feet supported Sitting balance-Leahy Scale: Fair Sitting balance - Comments: Able to maintain static sitting balance with supervision today Standing balance support: Bilateral upper extremity supported, During functional activity Standing balance-Leahy Scale: Poor Standing balance comment: Reliant on UE support    Special needs/care consideration BiPAP/CPAP n/a CPM  N/a Continuous Drip IV  N/a Dialysis yes        Days MWF [place]; ride [employer] transportation Life Vest  N/a Oxygen  N/a Special Bed  N/a Trach Size  N/a Wound Vac n/a Skin  Surgical incision with staples Bowel mgmt:  Continent LBM 4/8 Bladder mgmt:  anuric Diabetic mgmt: yes  Behavioral consideration  N/a Chemo/radiation  N/a   Previous Home Environment  Living Arrangements: Spouse/significant other(girlfriend, Brittney)  Lives With: Significant other Available Help at Discharge: Personal care attendant(PCS aide 2 pm until 7 pm monday through Friday; GF hospitali) Type of Home: Apartment Home Layout: One level Home Access: Level entry Bathroom Shower/Tub: Chiropodist: Handicapped height Bathroom Accessibility: Yes How Accessible: Accessible via wheelchair Home Care Services: Yes Additional Comments: Wheelchair accessible apartment  Discharge Living Setting Plans for Discharge Living Setting: Apartment, Lives with (comment)(girlfriend, Bartholome Bill who is hospitalized also) Type of Home at Discharge: Apartment Discharge Home Layout: One level Discharge Home Access: Level entry Discharge Bathroom Shower/Tub: Tub/shower unit, Curtain Discharge Bathroom Toilet: Handicapped height Discharge Bathroom Accessibility: Yes How Accessible: Accessible via wheelchair Does the patient have any problems  obtaining your medications?: No  Social/Family/Support Systems Patient Roles: Parent, Partner(2 children who do not live with him) Contact Information: sister, Blaike Vickers for his giflrfriend is hospitalized Anticipated Caregiver: sister and niece intermittent Anticipated Caregiver's Contact Information: sister [phone] Ability/Limitations of Caregiver: sister is a Marine scientist who works and is also on dialysis; niece lives near by and works but can check in on him Caregiver Availability: Intermittent Discharge Plan Discussed with Primary Caregiver: Yes Is Caregiver In Agreement with Plan?: Yes Does Caregiver/Family have Issues with Lodging/Transportation while Pt is in Rehab?: No  Goals/Additional Needs Patient/Family Goal for Rehab: Mod I wheelchair level PT, Mod I to supervision OT Expected length of stay: ELOS 7 to 10 days Special Service Needs: Hemodialysis MWF [place]; rides [employer] transportation to dialysis Pt/Family Agrees to Admission and willing to participate: Yes Program Orientation Provided & Reviewed with Pt/Caregiver Including Roles  &  Responsibilities: Yes  Decrease burden of Care through IP rehab admission: n/a  Possible need for SNF placement upon discharge: not anticipated  Patient Condition: I have reviewed medical records [facility] , spoken with CM, and patient. I met with patient at the bedside for inpatient rehabilitation assessment.  Patient will benefit from ongoing PT and OT, can actively participate in 3 hours of therapy a day 5 days of the week, and can make measurable gains during the admission.  Patient will also benefit from the coordinated team approach during an Inpatient Acute Rehabilitation admission.  The patient will receive intensive therapy as well as Rehabilitation physician, nursing, social worker, and care management interventions.  Due to bowel management, safety, skin/wound care, disease management, medication  administration, pain management and patient education the patient requires 24 hour a day rehabilitation nursing.  The patient is currently min to mod assist with mobility and basic ADLs.  Discharge setting and therapy post discharge at home with home health is anticipated.  Patient has agreed to participate in the Acute Inpatient Rehabilitation Program and will admit today.  Preadmission Screen Completed By:  Annamary Rummage MSN 11/23/2018 11:07 AM ______________________________________________________________________   Discussed status with Dr. Letta Pate on  11/23/2018 at 1108 and received approval for admission today.  Admission Coordinator:  Cleatrice Burke, RN MSN time 1423 Date 11/23/2018   Assessment/Plan: Diagnosis: Right below-knee amputation for 11/17/2018 1. Does the need for close, 24 hr/day Medical supervision in concert with the patient's rehab needs make it unreasonable for this patient to be served in a less intensive setting? Yes 2. Co-Morbidities requiring supervision/potential complications: End-stage renal disease, peripheral vascular disease, diabetes, hypertension 3. Due to bowel management, safety, skin/wound care, disease management, medication administration, pain management and patient education, does the patient require 24 hr/day rehab nursing? Yes 4. Does the patient require coordinated care of a physician, rehab nurse, PT (1-2 hrs/day, 5 days/week) and OT (1-2 hrs/day, 5 days/week) to address physical and functional deficits in the context of the above medical diagnosis(es)? Yes Addressing deficits in the following areas: balance, endurance, locomotion, strength, transferring, bowel/bladder control, bathing, dressing, feeding, grooming, toileting, cognition and psychosocial support 5. Can the patient actively participate in an intensive therapy program of at least 3 hrs of therapy 5 days a week? Yes 6. The potential for patient to make measurable gains while on  inpatient rehab is good 7. Anticipated functional outcomes upon discharge from inpatients are: modified independent and Wheelchair level PT, modified independent and Wheelchair level OT, n/a SLP 8. Estimated rehab length of stay to reach the above functional goals is: 7-10d 9. Anticipated D/C setting: Home 10. Anticipated post D/C treatments: [place] therapy 11. Overall Rehab/Functional Prognosis: excellent  MD Signature: Charlett Blake M.D. [facility] FAAPM&R (Sports Med, Neuromuscular Med) Diplomate [place] Board of Electrodiagnostic Med

## 2018-11-23 NOTE — Progress Notes (Addendum)
Inpatient Rehabilitation Admissions Coordinator  I met with patient at bedside and he is in agreement to admit to inpt rehab today. I have notified RN CM, Drue Dun and RN, Stanton Kidney. I will make the arrangements to admit today.  Danne Baxter, RN, MSN Rehab Admissions Coordinator [phone] 11/23/2018 9:48 AM   I have notified Veronica in hemodialysis of planned admit to inpt rehab today for scheduling his Dialysis MWF.

## 2018-11-23 NOTE — Progress Notes (Signed)
Pharmacy Antibiotic Note  [name]. is a 43 y.o. male with ESRD on HD MWF admitted on 11/23/2018 with necrotic right TMA.  He had a right BKA 4/3. Of note, patient was receiving vancomycin PTA for 6 weeks for MRSA bacteremia - through 12/16/18 per last ID note.   -last pre-HD vancomycin level was on 4/6  Plan: -Continue vancomycin 1g IV QHD-MWF -Will follow vancomycin levels as needed and patient progress      Temp (24hrs), Avg:99.1 F (37.3 C), Min:98.6 F (37 C), Max:99.4 F (37.4 C)  Recent Labs  Lab 11/17/18 1414 11/18/18 0207 11/19/18 0416 11/20/18 0235 11/20/18 0729 11/20/18 0730 11/22/18 0840  WBC 44.0* 27.8* 20.0*  --  14.9*  --  10.0  CREATININE 5.43* 6.67* 8.63*  --   --  10.55* 9.41*  VANCORANDOM  --   --   --  29  --   --   --     Estimated Creatinine Clearance: 10.4 mL/min (A) (by C-G formula based on SCr of 9.41 mg/dL (H)).    Allergies  Allergen Reactions  . Coconut Oil Anaphylaxis and Other (See Comments)    Can use topically, allergic to coconut foods    Hildred Laser, PharmD Clinical Pharmacist **Pharmacist phone directory can now be found on [email or web address] ([email or web address]).  Listed under [place].

## 2018-11-23 NOTE — Progress Notes (Signed)
11/23/2018 1345 Pt transferred to 4MW13/CIR. [place]

## 2018-11-23 NOTE — Discharge Summary (Signed)
Discharge Summary    [name] Castilleja Sr. [birth year] 43 y.o. male  [id number]  Admission Date: 11/17/2018  Discharge Date: 11/23/2018  Physician: [name], MD  Admission Diagnosis: Gangrenous Right Foot   HPI:   This is a 43 y.o. male with history of left lower extremity amputation.  Is undergone right lower extremity revascularization followed by toe amputation followed by transmetatarsal amputation which is failed to heal.  Was recently admitted with sepsis secondary to the foot infection treated with antibiotics.  Presents here from dialysis today with purulence draining from his foot.  He has significant pain in the right foot.  Hospital Course:  The patient was admitted to the hospital and taken to the operating room on 11/17/2018 and underwent: Right below knee amputation.      Findings: The nonhealing TMA was prepped out of the field.  Right BKA was performed with formalization and all the tissue appeared viable and healthy.  There was no purulence in the surgical wound where we performed the formalization of the BKA.  The pt tolerated the procedure well and was transported to the PACU in good condition.   By POD 1, he was doing well and leukocytosis had improved from 44k to 27k.  He did have acute blood loss anemia and was tolerating.  DM coordinator was consulted to help with management of DM.  Recommended sensitive SSI with Novalog 3U coverage tid with meals as well as his insulin he takes at home qhs.    On POD 2, his bandage was removed and wound looks good.  Leukocytosis continues to improve.  CIR consulted  On POD 3, pt was being evaluated for CIR when he learned his girlfriend was downstairs in the ICU intubated as a Covid r/o.  This test eventually returned negative.   He is being considered for CIR.    The vancomycin will continue through 12/16/2018 for positive blood cultures back on November 03, 2018.   The remainder of the hospital course consisted  of increasing mobilization and increasing intake of solids without difficulty.  Dialysis per renal service.    CBC    Component Value Date/Time   WBC 10.0 11/22/2018 0840   RBC 2.89 (L) 11/22/2018 0840   HGB 7.0 (L) 11/22/2018 0840   HGB 14.3 02/07/2018 0937   HCT 23.5 (L) 11/22/2018 0840   HCT 42.8 02/07/2018 0937   PLT 761 (H) 11/22/2018 0840   PLT 369 02/07/2018 0937   MCV 81.3 11/22/2018 0840   MCV 82 02/07/2018 0937   MCH 24.2 (L) 11/22/2018 0840   MCHC 29.8 (L) 11/22/2018 0840   RDW 18.7 (H) 11/22/2018 0840   RDW 21.5 (H) 02/07/2018 0937   LYMPHSABS 1.3 11/03/2018 0848   MONOABS 1.0 11/03/2018 0848   EOSABS 0.1 11/03/2018 0848   BASOSABS 0.0 11/03/2018 0848    BMET    Component Value Date/Time   NA 126 (L) 11/22/2018 0840   NA 133 (L) 02/07/2018 0937   K 3.5 11/22/2018 0840   CL 92 (L) 11/22/2018 0840   CO2 22 11/22/2018 0840   GLUCOSE 175 (H) 11/22/2018 0840   GLUCOSE 262 03/06/2008   BUN 49 (H) 11/22/2018 0840   BUN 37 (H) 02/07/2018 0937   CREATININE 9.41 (H) 11/22/2018 0840   CALCIUM 6.9 (L) 11/22/2018 0840   GFRNONAA 6 (L) 11/22/2018 0840   GFRAA 7 (L) 11/22/2018 0840      Discharge Instructions    Discharge patient   Complete by:  As directed    Transfer to CIR when they are ready to admit him.   Discharge disposition:  [place] Not Defined   Discharge patient date:  11/23/2018      Discharge Diagnosis:  Gangrenous Right Foot  Secondary Diagnosis: Patient Active Problem List   Diagnosis Date Noted  . Non-healing wound of lower extremity 11/17/2018  . MRSA bacteremia 11/04/2018  . Infection of amputation stump, right lower extremity ([place]) 11/04/2018  . Non-healing surgical wound 09/14/2018  . Chest pain, rule out acute myocardial infarction 09/07/2018  . Gangrene of toe of left foot ([place]) 08/05/2018  . 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 Gangrene of foot ([place]) 08/05/2018  . 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 ACS (acute coronary syndrome) ([place]) 10/28/2017  . Flash pulmonary edema ([place]) 06/04/2017  . Hypertensive emergency 06/04/2017  . 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 S/P BKA (below knee amputation) unilateral, left ([place])   . Post-operative pain   . Acute blood loss anemia   . Chronic combined systolic and diastolic CHF (congestive heart failure) ([place])   . PVD (peripheral vascular disease) ([place])   . Coronary artery disease involving native coronary artery of native heart with unstable angina pectoris ([place])   . Tobacco abuse   . Diabetic foot infection ([place]) 03/08/2017  . Osteomyelitis ([place]) 03/08/2017  . Wound dehiscence   . Dehiscence of amputation stump ([place]) 01/04/2017  . Gangrene of right foot ([place]) 08/02/2016  . Achilles tendon contracture, left 08/02/2016  . Anemia of renal disease 08/16/2015  . Wound infection 08/15/2015  . Diabetic ulcer of right great toe ([place]) 08/15/2015  . Pain in the chest   . Elevated troponin   . Essential hypertension   . ESRD on dialysis ([place]) 08/07/2013  . NSTEMI (non-ST elevated myocardial infarction) ([place]) 05/16/2012  . Murmur 05/16/2012  . Renal disorder    Past Medical History:  Diagnosis Date  . Anemia   . Atherosclerosis of lower extremity ([place])   . Blood clot in vein    right calf  . CAD (coronary artery disease)   . Cataracts, bilateral   . Chronic combined systolic and diastolic heart failure ([place])   . Complication of anesthesia   . Depression   . ESRD (end stage renal disease) on dialysis [facility])    "MWF [employer]" (03/08/2017)  . GERD (gastroesophageal reflux disease)   . Heart murmur   . History of blood transfusion    "related to OR"  . Hypertension   . Myocardial infarction ([place])     " light"  . Nonhealing surgical wound    nonviable tissue  . PONV (postoperative  nausea and vomiting)   . S/P unilateral BKA (below knee amputation), left ([place])   . Type II diabetes mellitus ([place])   . Wears glasses      Allergies as of 11/23/2018      Reactions   Coconut Oil Anaphylaxis, Other (See Comments)   Can use topically, allergic to coconut foods       Medication List    TAKE these medications   acetaminophen 325 MG tablet Commonly known as:  TYLENOL Take 2 tablets (650 mg total) by mouth every 4 (four) hours as needed for headache or mild pain.   aspirin EC 81 MG tablet Take 1 tablet (81  mg total) by mouth daily.   atorvastatin 80 MG tablet Commonly known as:  LIPITOR Take 1 tablet (80 mg total) by mouth daily at 6 PM.   calcitRIOL 0.25 MCG capsule Commonly known as:  ROCALTROL Take 5 capsules (1.25 mcg total) by mouth every Monday, Wednesday, and Friday with hemodialysis.   carvedilol 6.25 MG tablet Commonly known as:  COREG Take 1 tablet (6.25 mg total) by mouth 2 (two) times daily for 30 days.   clopidogrel 75 MG tablet Commonly known as:  Plavix Take 1 tablet (75 mg total) by mouth daily.   insulin glargine 100 UNIT/ML injection Commonly known as:  LANTUS Inject 0.14 mLs (14 Units total) into the skin at bedtime.   isosorbide mononitrate 30 MG 24 hr tablet Commonly known as:  IMDUR Take 1 tablet (30 mg total) by mouth daily.   lanthanum 1000 MG chewable tablet Commonly known as:  FOSRENOL Chew 2 tablets (2,000 mg total) by mouth 3 (three) times daily with meals.   morphine 15 MG tablet Commonly known as:  MSIR Take 15 mg by mouth every 4 (four) hours as needed for severe pain.   multivitamin Tabs tablet Take 1 tablet by mouth at bedtime.   ondansetron 4 MG tablet Commonly known as:  ZOFRAN Take 4 mg by mouth every 6 (six) hours as needed for nausea or vomiting.   vancomycin 1,000 mg in sodium chloride 0.9 % 250 mL Inject 1,000 mg into the vein every Monday, Wednesday, and Friday with hemodialysis. For six weeks        Prescriptions given: None given  Instructions: 1.  Sensitive sliding scale insulin 2.  Novalog 70/30 tid with meals was added by DM coordinator 3.  Vancomycin to continue with dialysis through 12/16/2018 due to positive blood cx 11/03/2018.  Disposition: home  Patient's condition: is Good  Follow up: 1. Dr. Carlis Abbott  in 4 weeks   Leontine Locket, PA-C [facility] [phone] 11/23/2018  7:38 AM

## 2018-11-23 NOTE — Progress Notes (Signed)
[facility] ROUNDING NOTE   Subjective:   Is a 43 year old gentleman who was admitted with a right TMA foot infection and underwent right BKA 11/17/2018.  Receives dialysis Monday Wednesday Friday.  He underwent uneventful dialysis 11/22/2018 his next dialysis plan is for 11/24/2018.  He had 5 L removed his weight is 70.9 kg  Blood pressure 115/54 pulse 79 temperature 98.9 O2 sats 90% room air  Sodium 126 potassium 3.5 chloride 92 CO2 22 BUN 49 creatinine 9.41 glucose 175 calcium 6.9 phosphorus 3.5 albumin 1.7 WBC 10.0 hemoglobin 7.0 platelet 761.  These are predialysis labs from 11/22/2018  Zosyn initiated on 11/17/2018.  Vancomycin 11/17/2018.  Aspirin 81 mg daily Zosyn 3.375 g every 12 hours , vancomycin per pharmacy, atorvastatin 80 mg daily carvedilol 6.25 mg twice daily, isosorbide 30 mg daily, calcitriol 1.25 mcg Monday Wednesday Friday, insulin sliding scale, insulin glargine 14 units, Fosrenol 2 g 3 times daily, Protonix 40 mg daily.   Blood cultures no growth from 11/04/2018    Objective:  Vital signs in last 24 hours:  Temp:  [98.2 F (36.8 C)-99.4 F (37.4 C)] 98.9 F (37.2 C) (04/09 0606) Pulse Rate:  [76-96] 79 (04/09 0606) Resp:  [13-18] 13 (04/09 0606) BP: (115-193)/(32-82) 115/44 (04/09 0606) SpO2:  [97 %-99 %] 97 % (04/08 1650) Weight:  [70.9 kg] 70.9 kg (04/08 1236)  Weight change: 0 kg Filed Weights   11/22/18 0330 11/22/18 0826 11/22/18 1236  Weight: 76.3 kg 76.3 kg 70.9 kg    Intake/Output: I/O last 3 completed shifts: In: 653 [P.O.:600; I.V.:3; IV Piggyback:50] Out: 5001 [Other:5000; Stool:1]   Intake/Output this shift:  No intake/output data recorded.   Chronically ill-appearing male no obvious distress CVS- RRR RS- CTA no wheezes rales ABD- BS present soft non-distended EXT- no edema   left AV fistula with bruit   bilateral BKA with trace edema intact stump with staples on the right no redness erythema or oozing   Basic Metabolic  Panel: Recent Labs  Lab 11/17/18 1414 11/18/18 0207 11/19/18 0416 11/20/18 0730 11/22/18 0840  NA 131* 130* 127* 127* 126*  K 3.3* 3.2* 3.4* 3.7 3.5  CL 93* 94* 92* 92* 92*  CO2 21* 22 22 20* 22  GLUCOSE 182* 223* 317* 244* 175*  BUN 20 30* 48* 62* 49*  CREATININE 5.43* 6.67* 8.63* 10.55* 9.41*  CALCIUM 9.7 8.8* 8.1* 7.7* 6.9*  PHOS  --   --   --  3.7 3.5    Liver Function Tests: Recent Labs  Lab 11/20/18 0730 11/22/18 0840  ALBUMIN 1.7* 1.7*   No results for input(s): LIPASE, AMYLASE in the last 168 hours. No results for input(s): AMMONIA in the last 168 hours.  CBC: Recent Labs  Lab 11/17/18 1414 11/18/18 0207 11/19/18 0416 11/20/18 0729 11/22/18 0840  WBC 44.0* 27.8* 20.0* 14.9* 10.0  HGB 8.8* 7.9* 8.4* 7.5* 7.0*  HCT 27.2* 24.4* 25.9* 24.0* 23.5*  MCV 78.6* 78.0* 77.5* 80.3 81.3  PLT 699* 662* 733* 754* 761*    Cardiac Enzymes: No results for input(s): CKTOTAL, CKMB, CKMBINDEX, TROPONINI in the last 168 hours.  BNP: Invalid input(s): POCBNP  CBG: Recent Labs  Lab 11/22/18 0619 11/22/18 1312 11/22/18 1649 11/22/18 2108 11/23/18 0604  GLUCAP 280* 191* 287* 213* 43*    Microbiology: Results for orders placed or performed during the hospital encounter of 11/03/18  Blood Culture (routine x 2)     Status: Abnormal   Collection Time: 11/03/18  8:54 AM  Result  Value Ref Range Status   Specimen Description BLOOD RIGHT HAND  Final   Special Requests   Final    BOTTLES DRAWN AEROBIC AND ANAEROBIC Blood Culture adequate volume   Culture  Setup Time   Final    GRAM POSITIVE COCCI IN BOTH AEROBIC AND ANAEROBIC BOTTLES CRITICAL RESULT CALLED TO, READ BACK BY AND VERIFIED WITH: C AMEND PHARMD 11/03/18 2349 JDW Performed at [facility], [place] [address], [place], [place] [address]    Culture METHICILLIN RESISTANT STAPHYLOCOCCUS AUREUS (A)  Final   Report Status 11/06/2018 FINAL  Final   Organism ID, Bacteria METHICILLIN RESISTANT STAPHYLOCOCCUS  AUREUS  Final      Susceptibility   Methicillin resistant staphylococcus aureus - MIC*    CIPROFLOXACIN >=8 RESISTANT Resistant     ERYTHROMYCIN >=8 RESISTANT Resistant     GENTAMICIN <=0.5 SENSITIVE Sensitive     OXACILLIN >=4 RESISTANT Resistant     TETRACYCLINE <=1 SENSITIVE Sensitive     VANCOMYCIN 1 SENSITIVE Sensitive     TRIMETH/SULFA <=10 SENSITIVE Sensitive     CLINDAMYCIN <=0.25 SENSITIVE Sensitive     RIFAMPIN <=0.5 SENSITIVE Sensitive     Inducible Clindamycin NEGATIVE Sensitive     * METHICILLIN RESISTANT STAPHYLOCOCCUS AUREUS  Blood Culture ID Panel (Reflexed)     Status: Abnormal   Collection Time: 11/03/18  8:54 AM  Result Value Ref Range Status   Enterococcus species NOT DETECTED NOT DETECTED Final   Listeria monocytogenes NOT DETECTED NOT DETECTED Final   Staphylococcus species DETECTED (A) NOT DETECTED Final    Comment: CRITICAL RESULT CALLED TO, READ BACK BY AND VERIFIED WITH: C AMEND PHARMD 11/03/18 2349 JDW    Staphylococcus aureus (BCID) DETECTED (A) NOT DETECTED Final    Comment: Methicillin (oxacillin)-resistant Staphylococcus aureus (MRSA). MRSA is predictably resistant to beta-lactam antibiotics (except ceftaroline). Preferred therapy is vancomycin unless clinically contraindicated. Patient requires contact precautions if  hospitalized. CRITICAL RESULT CALLED TO, READ BACK BY AND VERIFIED WITH: C AMEND PHARMD 11/03/18 2349 JDW    Methicillin resistance DETECTED (A) NOT DETECTED Final    Comment: CRITICAL RESULT CALLED TO, READ BACK BY AND VERIFIED WITH: C AMEND PHARMD 11/03/18 2349 JDW    Streptococcus species NOT DETECTED NOT DETECTED Final   Streptococcus agalactiae NOT DETECTED NOT DETECTED Final   Streptococcus pneumoniae NOT DETECTED NOT DETECTED Final   Streptococcus pyogenes NOT DETECTED NOT DETECTED Final   Acinetobacter baumannii NOT DETECTED NOT DETECTED Final   Enterobacteriaceae species NOT DETECTED NOT DETECTED Final   Enterobacter cloacae  complex NOT DETECTED NOT DETECTED Final   Escherichia coli NOT DETECTED NOT DETECTED Final   Klebsiella oxytoca NOT DETECTED NOT DETECTED Final   Klebsiella pneumoniae NOT DETECTED NOT DETECTED Final   Proteus species NOT DETECTED NOT DETECTED Final   Serratia marcescens NOT DETECTED NOT DETECTED Final   Haemophilus influenzae NOT DETECTED NOT DETECTED Final   Neisseria meningitidis NOT DETECTED NOT DETECTED Final   Pseudomonas aeruginosa NOT DETECTED NOT DETECTED Final   Candida albicans NOT DETECTED NOT DETECTED Final   Candida glabrata NOT DETECTED NOT DETECTED Final   Candida krusei NOT DETECTED NOT DETECTED Final   Candida parapsilosis NOT DETECTED NOT DETECTED Final   Candida tropicalis NOT DETECTED NOT DETECTED Final    Comment: Performed at [facility], [place]. [address] [place], [place] [address]  Blood Culture (routine x 2)     Status: Abnormal   Collection Time: 11/03/18  9:42 PM  Result Value  Ref Range Status   Specimen Description BLOOD RIGHT HAND  Final   Special Requests   Final    BOTTLES DRAWN AEROBIC ONLY Blood Culture results may not be optimal due to an inadequate volume of blood received in culture bottles   Culture  Setup Time   Final    AEROBIC BOTTLE ONLY GRAM POSITIVE COCCI CRITICAL VALUE NOTED.  VALUE IS CONSISTENT WITH PREVIOUSLY REPORTED AND CALLED VALUE. Performed at [facility], [place] [address] [place], [place] [address]    Culture (A)  Final    METHICILLIN RESISTANT STAPHYLOCOCCUS AUREUS CORRECTED ON 03/27 AT 0762: PREVIOUSLY REPORTED AS GRAM POSITIVE COCCI CORRECTED ON 03/26 AT 0124: PREVIOUSLY REPORTED AS NO GROWTH 5 DAYS   Report Status 11/11/2018 FINAL  Final   Organism ID, Bacteria METHICILLIN RESISTANT STAPHYLOCOCCUS AUREUS  Final      Susceptibility   Methicillin resistant staphylococcus aureus - MIC*    CIPROFLOXACIN >=8 RESISTANT Resistant     ERYTHROMYCIN >=8 RESISTANT Resistant     GENTAMICIN <=0.5 SENSITIVE Sensitive      OXACILLIN >=4 RESISTANT Resistant     TETRACYCLINE <=1 SENSITIVE Sensitive     VANCOMYCIN 1 SENSITIVE Sensitive     TRIMETH/SULFA <=10 SENSITIVE Sensitive     CLINDAMYCIN <=0.25 SENSITIVE Sensitive     RIFAMPIN <=0.5 SENSITIVE Sensitive     Inducible Clindamycin NEGATIVE Sensitive     * METHICILLIN RESISTANT STAPHYLOCOCCUS AUREUS CORRECTED ON 03/27 AT 0734: PREVIOUSLY REPORTED AS GRAM POSITIVE COCCI CORRECTED ON 03/26 AT 0124: PREVIOUSLY REPORTED AS NO GROWTH 5 DAYS  Culture, blood (Routine X 2) w Reflex to ID Panel     Status: None   Collection Time: 11/04/18  2:50 AM  Result Value Ref Range Status   Specimen Description BLOOD RIGHT FOREARM  Final   Special Requests   Final    BOTTLES DRAWN AEROBIC AND ANAEROBIC Blood Culture adequate volume   Culture   Final    NO GROWTH 5 DAYS Performed at [facility], [place] [address] [place], [place] [address]    Report Status 11/09/2018 FINAL  Final  Culture, blood (Routine X 2) w Reflex to ID Panel     Status: None   Collection Time: 11/04/18 10:07 AM  Result Value Ref Range Status   Specimen Description BLOOD RIGHT ARM  Final   Special Requests   Final    BOTTLES DRAWN AEROBIC ONLY Blood Culture adequate volume   Culture   Final    NO GROWTH 5 DAYS Performed at [facility], [place] [address] [place],  [address]    Report Status 11/09/2018 FINAL  Final    Coagulation Studies: No results for input(s): LABPROT, INR in the last 72 hours.  Urinalysis: No results for input(s): COLORURINE, LABSPEC, PHURINE, GLUCOSEU, HGBUR, BILIRUBINUR, KETONESUR, PROTEINUR, UROBILINOGEN, NITRITE, LEUKOCYTESUR in the last 72 hours.  Invalid input(s): APPERANCEUR    Imaging: No results found.   Medications:   . sodium chloride    . magnesium sulfate 1 - 4 g bolus IVPB    . piperacillin-tazobactam (ZOSYN)  IV 3.375 g (11/22/18 2006)  . vancomycin     . aspirin EC  81 mg Oral Daily  . atorvastatin  80 mg Oral q1800  . calcitRIOL   1.25 mcg Oral Q M,W,F-HD  . carvedilol  6.25 mg Oral BID  . Chlorhexidine Gluconate Cloth  6 each Topical Q0600  . clopidogrel  75 mg Oral Daily  . docusate sodium  100 mg Oral Daily  . heparin  5,000 Units Subcutaneous Q8H  . insulin aspart  0-9 Units Subcutaneous TID WC  . insulin aspart  3 Units Subcutaneous TID WC  . insulin glargine  14 Units Subcutaneous QHS  . isosorbide mononitrate  30 mg Oral Daily  . lanthanum  2,000 mg Oral TID WC  . multivitamin  1 tablet Oral QHS  . pantoprazole  40 mg Oral Daily  . sodium chloride flush  3 mL Intravenous Q12H  . 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  No growth and blood cultures.  I wonder when antibiotic therapy can be discontinued.  He has no white count but slight low-grade fever spike to 99.4    11/22/2018  End-stage renal disease hemodialysis Monday Wednesday Friday ultrafiltration of 5 L on 11/22/2018 he will receive his dialysis treatment 11/24/2018.  Anemia decrease hemoglobin darbepoetin 11/22/2018 IV iron load will be held with infection.  Metabolic bone disease continue medications  Congestive heart failure with a history of systolic dysfunction EF 46% last 2D echo January 2020  Coronary artery disease status post CABG x3.   LOS: [place] @TODAY @8 :40 AM

## 2018-11-24 ENCOUNTER — Inpatient Hospital Stay (HOSPITAL_COMMUNITY): Payer: Medicaid Other | Admitting: Physical Therapy

## 2018-11-24 ENCOUNTER — Inpatient Hospital Stay (HOSPITAL_COMMUNITY): Payer: Medicaid Other | Admitting: Occupational Therapy

## 2018-11-24 DIAGNOSIS — E119 Type 2 diabetes mellitus without complications: Secondary | ICD-10-CM

## 2018-11-24 DIAGNOSIS — Z992 Dependence on renal dialysis: Secondary | ICD-10-CM

## 2018-11-24 DIAGNOSIS — G8918 Other acute postprocedural pain: Secondary | ICD-10-CM

## 2018-11-24 DIAGNOSIS — R7881 Bacteremia: Secondary | ICD-10-CM

## 2018-11-24 DIAGNOSIS — D62 Acute posthemorrhagic anemia: Secondary | ICD-10-CM

## 2018-11-24 DIAGNOSIS — S88111A Complete traumatic amputation at level between knee and ankle, right lower leg, initial encounter: Secondary | ICD-10-CM

## 2018-11-24 LAB — GLUCOSE, CAPILLARY
Glucose-Capillary: 255 mg/dL — ABNORMAL HIGH (ref 70–99)
Glucose-Capillary: 86 mg/dL (ref 70–99)
Glucose-Capillary: 178 mg/dL — ABNORMAL HIGH (ref 70–99)

## 2018-11-24 LAB — RENAL FUNCTION PANEL
Albumin: 1.8 g/dL — ABNORMAL LOW (ref 3.5–5.0)
Anion gap: 15 (ref 5–15)
BUN: 46 mg/dL — ABNORMAL HIGH (ref 6–20)
CO2: 22 mmol/L (ref 22–32)
Calcium: 7.2 mg/dL — ABNORMAL LOW (ref 8.9–10.3)
Chloride: 92 mmol/L — ABNORMAL LOW (ref 98–111)
Creatinine, Ser: 9.23 mg/dL — ABNORMAL HIGH (ref 0.61–1.24)
GFR calc Af Amer: 7 mL/min — ABNORMAL LOW (ref >60)
GFR calc non Af Amer: 6 mL/min — ABNORMAL LOW (ref >60)
Glucose, Bld: 208 mg/dL — ABNORMAL HIGH (ref 70–99)
Phosphorus: 4.9 mg/dL — ABNORMAL HIGH (ref 2.5–4.6)
Potassium: 4.5 mmol/L (ref 3.5–5.1)
Sodium: 129 mmol/L — ABNORMAL LOW (ref 135–145)

## 2018-11-24 LAB — CBC
HCT: 23.1 % — ABNORMAL LOW (ref 39.0–52.0)
Hemoglobin: 7.2 g/dL — ABNORMAL LOW (ref 13.0–17.0)
MCH: 25.4 pg — ABNORMAL LOW (ref 26.0–34.0)
MCHC: 31.2 g/dL (ref 30.0–36.0)
MCV: 81.6 fL (ref 80.0–100.0)
Platelets: 821 10*3/uL — ABNORMAL HIGH (ref 150–400)
RBC: 2.83 MIL/uL — ABNORMAL LOW (ref 4.22–5.81)
RDW: 18.9 % — ABNORMAL HIGH (ref 11.5–15.5)
WBC: 10.6 10*3/uL — ABNORMAL HIGH (ref 4.0–10.5)
nRBC: 0 % (ref 0.0–0.2)

## 2018-11-24 MED ORDER — LIDOCAINE-PRILOCAINE 2.5-2.5 % EX CREA
1.0000 "application " | TOPICAL_CREAM | CUTANEOUS | Status: DC | PRN
  Filled 2018-11-24: qty 5

## 2018-11-24 MED ORDER — HEPARIN SODIUM (PORCINE) 1000 UNIT/ML DIALYSIS
1000.0000 [IU] | INTRAMUSCULAR | Status: DC | PRN
  Filled 2018-11-24: qty 1

## 2018-11-24 MED ORDER — LIDOCAINE HCL (PF) 1 % IJ SOLN: 5.0000 mL | INTRAMUSCULAR | Status: DC | PRN

## 2018-11-24 MED ORDER — ALTEPLASE 2 MG IJ SOLR
2.0000 mg | Freq: Once | INTRAMUSCULAR | Status: DC | PRN
  Filled 2018-11-24: qty 2

## 2018-11-24 MED ORDER — SODIUM CHLORIDE 0.9 % IV SOLN: 100.0000 mL | INTRAVENOUS | Status: DC | PRN

## 2018-11-24 MED ORDER — PENTAFLUOROPROP-TETRAFLUOROETH EX AERO: 1.0000 "application " | INHALATION_SPRAY | CUTANEOUS | Status: DC | PRN

## 2018-11-24 MED ORDER — CHLORHEXIDINE GLUCONATE CLOTH 2 % EX PADS
6.0000 | MEDICATED_PAD | Freq: Every day | CUTANEOUS | Status: DC
  Administered 2018-11-24 – 2018-11-28 (×2): 6 via TOPICAL

## 2018-11-24 NOTE — Progress Notes (Signed)
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  Receives dialysis Monday Wednesday Friday.  He underwent uneventful dialysis 11/22/2018 his next dialysis plan is for 11/24/2018.  He had 5 L removed his weight is 70.9 kg  Blood pressure 129/34 pulse 79 temperature 98.1  Sodium 126 potassium 3.5 chloride 92 CO2 22 BUN 49 creatinine 9.41 glucose 175 calcium 6.9 phosphorus 3.5 albumin 1.7 WBC 10.0 hemoglobin 7.0 platelet 761.  These are predialysis labs from 11/22/2018    will have labs drawn with dialysis  Zosyn initiated on 11/17/2018.  This was discontinued 11/24/2018   vancomycin 11/17/2018.  Aspirin 81 mg daily  , vancomycin per pharmacy, atorvastatin 80 mg daily carvedilol 6.25 mg twice daily, isosorbide 30 mg daily, calcitriol 1.25 mcg Monday Wednesday Friday, insulin sliding scale, insulin glargine 14 units, Fosrenol 2 g 3 times daily, Protonix 40 mg daily.   Blood cultures no growth from 11/04/2018    Objective:  Vital signs in last 24 hours:  Temp:  [98.1 F (36.7 C)-99.4 F (37.4 C)] 98.1 F (36.7 C) (04/10 0557) Pulse Rate:  [79-80] 79 (04/10 0557) Resp:  [12-15] 15 (04/10 0557) BP: (129-143)/(34-62) 129/34 (04/10 0557) SpO2:  [97 %-100 %] 100 % (04/10 0557) Weight:  [70.9 kg] 70.9 kg (04/09 1551)  Weight change:  Filed Weights   11/23/18 1551  Weight: 70.9 kg    Intake/Output: No intake/output data recorded.   Intake/Output this shift:  No intake/output data recorded.   Chronically ill-appearing male no obvious distress CVS- RRR RS- CTA no wheezes rales ABD- BS present soft non-distended EXT- no edema   left AV fistula with bruit   bilateral BKA with trace edema intact stump with staples on the right no redness erythema or oozing   Basic Metabolic Panel: Recent Labs  Lab 11/17/18 1414 11/18/18 0207 11/19/18 0416 11/20/18 0730 11/22/18 0840   NA 131* 130* 127* 127* 126*  K 3.3* 3.2* 3.4* 3.7 3.5  CL 93* 94* 92* 92* 92*  CO2 21* 22 22 20* 22  GLUCOSE 182* 223* 317* 244* 175*  BUN 20 30* 48* 62* 49*  CREATININE 5.43* 6.67* 8.63* 10.55* 9.41*  CALCIUM 9.7 8.8* 8.1* 7.7* 6.9*  PHOS  --   --   --  3.7 3.5    Liver Function Tests: Recent Labs  Lab 11/20/18 0730 11/22/18 0840  ALBUMIN 1.7* 1.7*   No results for input(s): LIPASE, AMYLASE in the last 168 hours. No results for input(s): AMMONIA in the last 168 hours.  CBC: Recent Labs  Lab 11/17/18 1414 11/18/18 0207 11/19/18 0416 11/20/18 0729 11/22/18 0840  WBC 44.0* 27.8* 20.0* 14.9* 10.0  HGB 8.8* 7.9* 8.4* 7.5* 7.0*  HCT 27.2* 24.4* 25.9* 24.0* 23.5*  MCV 78.6* 78.0* 77.5* 80.3 81.3  PLT 699* 662* 733* 754* 761*    Cardiac Enzymes: No results for input(s): CKTOTAL, CKMB, CKMBINDEX, TROPONINI in the last 168 hours.  BNP: Invalid input(s): POCBNP  CBG: Recent Labs  Lab 11/23/18 0604 11/23/18 1257 11/23/18 1652 11/23/18 2135 11/24/18 0611  GLUCAP 221* 245* 269* 219* 255*    Microbiology: Results for orders placed or performed during the hospital encounter of 11/03/18  Blood Culture (routine x 2)     Status: Abnormal   Collection Time: 11/03/18  8:54 AM  Result Value Ref Range Status   Specimen Description BLOOD RIGHT HAND  Final  Special Requests   Final    BOTTLES DRAWN AEROBIC AND ANAEROBIC Blood Culture adequate volume   Culture  Setup Time   Final    GRAM POSITIVE COCCI IN BOTH AEROBIC AND ANAEROBIC BOTTLES CRITICAL RESULT CALLED TO, READ BACK BY AND VERIFIED WITH: C AMEND PHARMD 11/03/18 2349 JDW Performed at [facility], [place] [address], [place], [place] [address]    Culture METHICILLIN RESISTANT STAPHYLOCOCCUS AUREUS (A)  Final   Report Status 11/06/2018 FINAL  Final   Organism ID, Bacteria METHICILLIN RESISTANT STAPHYLOCOCCUS AUREUS  Final      Susceptibility   Methicillin resistant staphylococcus aureus - MIC*     CIPROFLOXACIN >=8 RESISTANT Resistant     ERYTHROMYCIN >=8 RESISTANT Resistant     GENTAMICIN <=0.5 SENSITIVE Sensitive     OXACILLIN >=4 RESISTANT Resistant     TETRACYCLINE <=1 SENSITIVE Sensitive     VANCOMYCIN 1 SENSITIVE Sensitive     TRIMETH/SULFA <=10 SENSITIVE Sensitive     CLINDAMYCIN <=0.25 SENSITIVE Sensitive     RIFAMPIN <=0.5 SENSITIVE Sensitive     Inducible Clindamycin NEGATIVE Sensitive     * METHICILLIN RESISTANT STAPHYLOCOCCUS AUREUS  Blood Culture ID Panel (Reflexed)     Status: Abnormal   Collection Time: 11/03/18  8:54 AM  Result Value Ref Range Status   Enterococcus species NOT DETECTED NOT DETECTED Final   Listeria monocytogenes NOT DETECTED NOT DETECTED Final   Staphylococcus species DETECTED (A) NOT DETECTED Final    Comment: CRITICAL RESULT CALLED TO, READ BACK BY AND VERIFIED WITH: C AMEND PHARMD 11/03/18 2349 JDW    Staphylococcus aureus (BCID) DETECTED (A) NOT DETECTED Final    Comment: Methicillin (oxacillin)-resistant Staphylococcus aureus (MRSA). MRSA is predictably resistant to beta-lactam antibiotics (except ceftaroline). Preferred therapy is vancomycin unless clinically contraindicated. Patient requires contact precautions if  hospitalized. CRITICAL RESULT CALLED TO, READ BACK BY AND VERIFIED WITH: C AMEND PHARMD 11/03/18 2349 JDW    Methicillin resistance DETECTED (A) NOT DETECTED Final    Comment: CRITICAL RESULT CALLED TO, READ BACK BY AND VERIFIED WITH: C AMEND PHARMD 11/03/18 2349 JDW    Streptococcus species NOT DETECTED NOT DETECTED Final   Streptococcus agalactiae NOT DETECTED NOT DETECTED Final   Streptococcus pneumoniae NOT DETECTED NOT DETECTED Final   Streptococcus pyogenes NOT DETECTED NOT DETECTED Final   Acinetobacter baumannii NOT DETECTED NOT DETECTED Final   Enterobacteriaceae species NOT DETECTED NOT DETECTED Final   Enterobacter cloacae complex NOT DETECTED NOT DETECTED Final   Escherichia coli NOT DETECTED NOT DETECTED Final    Klebsiella oxytoca NOT DETECTED NOT DETECTED Final   Klebsiella pneumoniae NOT DETECTED NOT DETECTED Final   Proteus species NOT DETECTED NOT DETECTED Final   Serratia marcescens NOT DETECTED NOT DETECTED Final   Haemophilus influenzae NOT DETECTED NOT DETECTED Final   Neisseria meningitidis NOT DETECTED NOT DETECTED Final   Pseudomonas aeruginosa NOT DETECTED NOT DETECTED Final   Candida albicans NOT DETECTED NOT DETECTED Final   Candida glabrata NOT DETECTED NOT DETECTED Final   Candida krusei NOT DETECTED NOT DETECTED Final   Candida parapsilosis NOT DETECTED NOT DETECTED Final   Candida tropicalis NOT DETECTED NOT DETECTED Final    Comment: Performed at [facility], [place]. [address] [place], [place] [address]  Blood Culture (routine x 2)     Status: Abnormal   Collection Time: 11/03/18  9:42 PM  Result Value Ref Range Status   Specimen Description BLOOD RIGHT HAND  Final   Special  Requests   Final    BOTTLES DRAWN AEROBIC ONLY Blood Culture results may not be optimal due to an inadequate volume of blood received in culture bottles   Culture  Setup Time   Final    AEROBIC BOTTLE ONLY GRAM POSITIVE COCCI CRITICAL VALUE NOTED.  VALUE IS CONSISTENT WITH PREVIOUSLY REPORTED AND CALLED VALUE. Performed at [facility], [place] [address], [place], [place] [address]    Culture (A)  Final    METHICILLIN RESISTANT STAPHYLOCOCCUS AUREUS CORRECTED ON 03/27 AT 6063: PREVIOUSLY REPORTED AS GRAM POSITIVE COCCI CORRECTED ON 03/26 AT 0124: PREVIOUSLY REPORTED AS NO GROWTH 5 DAYS   Report Status 11/11/2018 FINAL  Final   Organism ID, Bacteria METHICILLIN RESISTANT STAPHYLOCOCCUS AUREUS  Final      Susceptibility   Methicillin resistant staphylococcus aureus - MIC*    CIPROFLOXACIN >=8 RESISTANT Resistant     ERYTHROMYCIN >=8 RESISTANT Resistant     GENTAMICIN <=0.5 SENSITIVE Sensitive     OXACILLIN >=4 RESISTANT Resistant     TETRACYCLINE <=1 SENSITIVE Sensitive     VANCOMYCIN 1  SENSITIVE Sensitive     TRIMETH/SULFA <=10 SENSITIVE Sensitive     CLINDAMYCIN <=0.25 SENSITIVE Sensitive     RIFAMPIN <=0.5 SENSITIVE Sensitive     Inducible Clindamycin NEGATIVE Sensitive     * METHICILLIN RESISTANT STAPHYLOCOCCUS AUREUS CORRECTED ON 03/27 AT 0734: PREVIOUSLY REPORTED AS GRAM POSITIVE COCCI CORRECTED ON 03/26 AT 0124: PREVIOUSLY REPORTED AS NO GROWTH 5 DAYS  Culture, blood (Routine X 2) w Reflex to ID Panel     Status: None   Collection Time: 11/04/18  2:50 AM  Result Value Ref Range Status   Specimen Description BLOOD RIGHT FOREARM  Final   Special Requests   Final    BOTTLES DRAWN AEROBIC AND ANAEROBIC Blood Culture adequate volume   Culture   Final    NO GROWTH 5 DAYS Performed at [facility], [place] [address], [place], [place] [address]    Report Status 11/09/2018 FINAL  Final  Culture, blood (Routine X 2) w Reflex to ID Panel     Status: None   Collection Time: 11/04/18 10:07 AM  Result Value Ref Range Status   Specimen Description BLOOD RIGHT ARM  Final   Special Requests   Final    BOTTLES DRAWN AEROBIC ONLY Blood Culture adequate volume   Culture   Final    NO GROWTH 5 DAYS Performed at [facility], [place] [address], [place], [place] [address]    Report Status 11/09/2018 FINAL  Final    Coagulation Studies: No results for input(s): LABPROT, INR in the last 72 hours.  Urinalysis: No results for input(s): COLORURINE, LABSPEC, PHURINE, GLUCOSEU, HGBUR, BILIRUBINUR, KETONESUR, PROTEINUR, UROBILINOGEN, NITRITE, LEUKOCYTESUR in the last 72 hours.  Invalid input(s): APPERANCEUR    Imaging: No results found.   Medications:   . vancomycin     . aspirin EC  81 mg Oral Daily  . atorvastatin  80 mg Oral q1800  . calcitRIOL  1.25 mcg Oral Q M,W,F-HD  . carvedilol  6.25 mg Oral BID  . clopidogrel  75 mg Oral Daily  . docusate sodium  100 mg Oral Daily  . heparin  5,000 Units Subcutaneous Q8H  . insulin aspart  0-9 Units Subcutaneous TID WC   . insulin aspart  3 Units Subcutaneous TID WC  . insulin glargine  14 Units Subcutaneous QHS  . isosorbide mononitrate  30 mg Oral Daily  .  lanthanum  2,000 mg Oral TID WC  . multivitamin  1 tablet Oral QHS  . 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  It appears that the Zosyn was discontinued 11/23/2018.  No growth and blood cultures.  I wonder when antibiotic therapy can be discontinued.    End-stage renal disease hemodialysis Monday Wednesday Friday ultrafiltration of 5 L on 11/22/2018 he will receive his dialysis treatment 11/24/2018.  Anemia decrease hemoglobin darbepoetin 11/22/2018 IV iron load will be held with infection.  Metabolic bone disease continue medications  Congestive heart failure with a history of systolic dysfunction EF 87% last 2D echo January 2020  Coronary artery disease status post CABG x3.   LOS: [place] @TODAY @9 :41 AM

## 2018-11-24 NOTE — Evaluation (Signed)
Physical Therapy Assessment and Plan  Patient Details  Name: [name] Champine Sr. MRN: [id number] Date of Birth: 1976-02-17  PT Diagnosis: Impaired sensation, Muscle weakness and Pain in residual limb Rehab Potential: Good ELOS: 5 days   Today's Date: 11/24/2018 PT Individual Time: 2951-8841 PT Individual Time Calculation (min): 50 min    Problem List:  Patient Active Problem List   Diagnosis Date Noted  . S/P bilateral below knee amputation ([place]) 11/23/2018  . Non-healing wound of lower extremity 11/17/2018  . MRSA bacteremia 11/04/2018  . Infection of amputation stump, right lower extremity ([place]) 11/04/2018  . Non-healing surgical wound 09/14/2018  . Chest pain, rule out acute myocardial infarction 09/07/2018  . Gangrene of toe of left foot ([place]) 08/05/2018  . 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 Gangrene of foot ([place]) 08/05/2018  . 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 ACS (acute coronary syndrome) ([place]) 10/28/2017  . Flash pulmonary edema ([place]) 06/04/2017  . Hypertensive emergency 06/04/2017  . 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 S/P BKA (below knee amputation) unilateral, left ([place])   . Post-operative pain   . Acute blood loss anemia   . Chronic combined systolic and diastolic CHF (congestive heart failure) ([place])   . PVD (peripheral vascular disease) ([place])   . Coronary artery disease involving native coronary artery of native heart with unstable angina pectoris ([place])   . Tobacco abuse   . Diabetic foot infection ([place]) 03/08/2017  . Osteomyelitis ([place]) 03/08/2017  . Wound dehiscence   . Dehiscence of amputation stump ([place]) 01/04/2017  . Gangrene of right foot ([place]) 08/02/2016  . Achilles tendon contracture, left 08/02/2016  . Anemia of renal disease 08/16/2015  . Wound infection 08/15/2015  . Diabetic ulcer  of right great toe ([place]) 08/15/2015  . Pain in the chest   . Elevated troponin   . Essential hypertension   . ESRD on dialysis ([place]) 08/07/2013  . NSTEMI (non-ST elevated myocardial infarction) ([place]) 05/16/2012  . Murmur 05/16/2012  . Renal disorder     Past Medical History:  Past Medical History:  Diagnosis Date  . Anemia   . Atherosclerosis of lower extremity ([place])   . Blood clot in vein    right calf  . CAD (coronary artery disease)   . Cataracts, bilateral   . Chronic combined systolic and diastolic heart failure ([place])   . Complication of anesthesia   . Depression   . ESRD (end stage renal disease) on dialysis [facility])    "MWF [employer]" (03/08/2017)  . GERD (gastroesophageal reflux disease)   . Heart murmur   . History of blood transfusion    "related to OR"  . Hypertension   . Myocardial infarction ([place])     " light"  . Nonhealing surgical wound    nonviable tissue  . PONV (postoperative nausea and vomiting)   . S/P unilateral BKA (below knee amputation), left ([place])   . Type II diabetes mellitus ([place])   . Wears glasses    Past Surgical History:  Past Surgical History:  Procedure Laterality Date  . ABDOMINAL AORTOGRAM N/A 11/02/2016   Procedure: Abdominal Aortogram;  Surgeon: Waynetta Sandy, MD;  Location: [place] [facility];  Service: Cardiovascular;  Laterality: N/A;  . ABDOMINAL AORTOGRAM W/LOWER EXTREMITY Right 08/07/2018   Procedure: ABDOMINAL AORTOGRAM W/LOWER EXTREMITY;  Surgeon: Carlis Abbott,  Gwenyth Allegra, MD;  Location: [place] [facility];  Service: Cardiovascular;  Laterality: Right;  . AMPUTATION Left 09/27/2013   Procedure: LEFT GREAT TOE AMPUTATION;  Surgeon: [name], MD;  Location: [place];  Service: Orthopedics;  Laterality: Left;  . AMPUTATION Right 08/15/2015   Procedure: Right Great Toe Amputation;  Surgeon: [name], MD;  Location: [place];  Service: Orthopedics;  Laterality: Right;  . AMPUTATION Left 11/05/2016   Procedure:  TRANSMETATARSAL AMPUTATION LEFT FOOT;  Surgeon: [name], MD;  Location: [place];  Service: Orthopedics;  Laterality: Left;  . AMPUTATION Left 03/11/2017   Procedure: LEFT BELOW KNEE AMPUTATION;  Surgeon: [name], MD;  Location: [place];  Service: Orthopedics;  Laterality: Left;  . AMPUTATION Right 03/11/2017   Procedure: RIGHT 2ND TOE AMPUTATION;  Surgeon: [name], MD;  Location: [place];  Service: Orthopedics;  Laterality: Right;  . AMPUTATION Right 11/17/2018   Procedure: AMPUTATION BELOW KNEE;  Surgeon: [name], MD;  Location: [place];  Service: Vascular;  Laterality: Right;  . AV FISTULA PLACEMENT  left arm  . CORONARY ARTERY BYPASS GRAFT N/A 11/04/2017   Procedure: CORONARY ARTERY BYPASS GRAFTING (CABG) x three, using left internal mammary artery and right    leg greater saphenous vein;  Surgeon: Melrose Nakayama, MD;  Location: [place];  Service: Open Heart Surgery;  Laterality: N/A;  . FASCIOTOMY Right 08/07/2018   Procedure: FOUR COMPARTMENT FASCIOTOMY OF RIGHT LOWER LEG;  Surgeon: Rosetta Posner, MD;  Location: [place];  Service: Vascular;  Laterality: Right;  . FASCIOTOMY CLOSURE Right 08/10/2018   Procedure: FASCIOTOMY CLOSURE RIGHT LOWER EXTREMITY;  Surgeon: [name], MD;  Location: [place];  Service: Vascular;  Laterality: Right;  . HEMATOMA EVACUATION Right 08/07/2018   Procedure: EVACUATION HEMATOMA;  Surgeon: Rosetta Posner, MD;  Location: [place];  Service: Vascular;  Laterality: Right;  . LEFT HEART CATH AND CORONARY ANGIOGRAPHY N/A 10/31/2017   Procedure: LEFT HEART CATH AND CORONARY ANGIOGRAPHY;  Surgeon: Troy Sine, MD;  Location: [place] [facility];  Service: Cardiovascular;  Laterality: N/A;  . LEFT HEART CATHETERIZATION WITH CORONARY ANGIOGRAM N/A 09/13/2014   Procedure: LEFT HEART CATHETERIZATION WITH CORONARY ANGIOGRAM;  Surgeon: Sinclair Grooms, MD;  Location: [facility] C[facility] LAB;  Service: Cardiovascular;  Laterality: N/A;  . LOWER EXTREMITY  ANGIOGRAPHY Bilateral 11/02/2016   Procedure: Lower Extremity Angiography;  Surgeon: Waynetta Sandy, MD;  Location: [place] [facility];  Service: Cardiovascular;  Laterality: Bilateral;  . PERIPHERAL VASCULAR ATHERECTOMY Left 11/02/2016   Procedure: Peripheral Vascular Atherectomy;  Surgeon: Waynetta Sandy, MD;  Location: [place] [facility];  Service: Cardiovascular;  Laterality: Left;  PERONEAL  . PERIPHERAL VASCULAR ATHERECTOMY Right 08/07/2018   Procedure: PERIPHERAL VASCULAR ATHERECTOMY;  Surgeon: [name], MD;  Location: [place] [facility];  Service: Cardiovascular;  Laterality: Right;  Peroneal artery  . PERIPHERAL VASCULAR BALLOON ANGIOPLASTY Right 08/07/2018   Procedure: PERIPHERAL VASCULAR BALLOON ANGIOPLASTY;  Surgeon: [name], MD;  Location: [place] [facility];  Service: Cardiovascular;  Laterality: Right;  anterior tibial artery  . STUMP REVISION Left 01/11/2017   Procedure: Revision Left Transmetatarsal Amputation;  Surgeon: [name], MD;  Location: [place];  Service: Orthopedics;  Laterality: Left;  . TEE WITHOUT CARDIOVERSION N/A 11/04/2017   Procedure: TRANSESOPHAGEAL ECHOCARDIOGRAM (TEE);  Surgeon: Melrose Nakayama, MD;  Location: [place];  Service: Open Heart Surgery;  Laterality: N/A;  .  TRANSMETATARSAL AMPUTATION Right 08/10/2018   Procedure: RIGHT FIFTH TOE AMPUTATION;  Surgeon: [name], MD;  Location: [place];  Service: Vascular;  Laterality: Right;  . TRANSMETATARSAL AMPUTATION Right 09/14/2018   Procedure: TRANSMETATARSAL AMPUTATION;  Surgeon: [name], MD;  Location: [facility] OR;  Service: Vascular;  Laterality: Right;    Assessment & Plan Clinical Impression: Patient is a 43 y.o. year old male with recent admission to the hospital on 11/17/2018 with noted multiple revascularization procedures of right lower extremity and right second toe amputation and poor healing. Limb was not felt to be salvageable and  underwent right BKA 11/17/2018 .  Patient transferred to CIR on 11/23/2018 .   Patient currently requires supervision with mobility secondary to muscle weakness and decreased sitting balance.  Prior to hospitalization, patient was modified independent  with mobility and lived with Significant other in a [place] home.  Home access is  Level entry.  Patient will benefit from skilled PT intervention to maximize safe functional mobility, minimize fall risk and decrease caregiver burden for planned discharge home with intermittent assist.  Anticipate patient will benefit from follow up [facility] at discharge.  PT - End of Session Activity Tolerance: Tolerates 30+ min activity with multiple rests Endurance Deficit: Yes PT Assessment Rehab Potential (ACUTE/IP ONLY): Good PT Barriers to Discharge: Decreased caregiver support PT Patient demonstrates impairments in the following area(s): Balance;Endurance;Motor;Pain;Edema;Sensory;Skin Integrity PT Transfers Functional Problem(s): Bed Mobility;Bed to Chair;Car;Furniture PT Locomotion Functional Problem(s): Wheelchair Mobility PT Plan PT Intensity: Minimum of 1-2 x/day ,45 to 90 minutes PT Frequency: 5 out of 7 days PT Duration Estimated Length of Stay: 5 days PT Treatment/Interventions: Financial risk analyst;Neuromuscular re-education;Stair training;UE/LE Strength taining/ROM;Wheelchair propulsion/positioning;UE/LE Coordination activities;Therapeutic Activities;Pain management;Discharge planning;Balance/vestibular training;Functional mobility training;Patient/family education;Splinting/orthotics;Therapeutic Exercise PT Transfers Anticipated Outcome(s): mod I PT Locomotion Anticipated Outcome(s):  mod I w/c level PT Recommendation Follow Up Recommendations: Home health PT Patient destination: Home Equipment Recommended: None recommended by PT  Skilled Therapeutic Intervention Pt participated in skilled PT eval and was  educated on goals and PT POC. Pt performs transfers A/P with supervision/setup assistance including w/c, bed and simulated car. Pt performs therex for bilat LE strength and ROM with AAROM for knee extension.  PT Evaluation Precautions/Restrictions Precautions Precautions: Fall Precaution Comments: Prior Left BKA; left prosthetic. Recent R BKA.  Restrictions Weight Bearing Restrictions: Yes RLE Weight Bearing: Non weight bearing General PT Amount of Missed Time (min): 10 Minutes PT Missed Treatment Reason: Pain Vital Signs Pain Pain Assessment Pain Scale: 0-10 Pain Score: 7  Faces Pain Scale: Hurts whole lot Pain Type: Acute pain;Surgical pain Pain Location: Leg Pain Orientation: Right Pain Descriptors / Indicators: Aching;Grimacing;Discomfort Pain Onset: On-going Pain Intervention(s): RN made aware;Distraction;Repositioned Home Living/Prior Functioning Home Living Living Arrangements: Spouse/significant other Available Help at Discharge: Personal care attendant Type of Home: Apartment Home Access: Level entry Home Layout: One level Additional Comments: Wheelchair accessible apartment  Lives With: Significant other Prior Function Level of Independence: Requires assistive device for independence  Able to Take Stairs?: No Driving: No Vocation: On disability Comments: Aide comes in 5 days/week, 5 hrs per day. Does cooking/cleaning.  Vision/Perception     Cognition Overall Cognitive Status: Within Functional Limits for tasks assessed Arousal/Alertness: Awake/alert Orientation Level: Oriented X4 Sensation Sensation Light Touch: Impaired Detail Light Touch Impaired Details: Impaired RLE Proprioception: Appears Intact Coordination Gross Motor Movements are Fluid and Coordinated: Yes Fine Motor Movements are Fluid and Coordinated: Yes Motor  Motor Motor - Skilled Clinical Observations: generalized weakness  Mobility Bed Mobility Bed Mobility: Supine to Sit;Sit to  Supine Supine to Sit: Supervision/Verbal cueing Sit to Supine: Supervision/Verbal cueing Transfers Transfers: Government social research officer Transfer: Supervision/Verbal cueing Transfer (Assistive device): None Locomotion  Gait Ambulation: No Gait Gait: No Stairs / Additional Locomotion Stairs: No Wheelchair Mobility Wheelchair Mobility: Yes Wheelchair Assistance: Chartered loss adjuster: Both upper extremities Wheelchair Parts Management: Needs assistance  Trunk/Postural Assessment  Cervical Assessment Cervical Assessment: Within Water engineer Thoracic Assessment: Within Functional Limits Lumbar Assessment Lumbar Assessment: Within Functional Limits Postural Control Postural Control: Within Functional Limits  Balance Dynamic Sitting Balance Sitting balance - Comments: Able to maintain static sitting balance with supervision Extremity Assessment      RLE Assessment Passive Range of Motion (PROM) Comments: pt only able to extend knee to 25 degrees General Strength Comments: grossly 3/5 LLE Assessment LLE Assessment: Within Functional Limits    Refer to Care Plan for Long Term Goals  Recommendations for other services: None   Discharge Criteria: Patient will be discharged from PT if patient refuses treatment 3 consecutive times without medical reason, if treatment goals not met, if there is a change in medical status, if patient makes no progress towards goals or if patient is discharged from hospital.  The above assessment, treatment plan, treatment alternatives and goals were discussed and mutually agreed upon: by patient  [facility] 11/24/2018, 10:39 AM

## 2018-11-24 NOTE — Progress Notes (Signed)
Social Work  Social Work Assessment and Plan  Patient Details  Name: [name] Zapien Sr. MRN: [id number] Date of Birth: 23-Nov-1975  Today's Date: 11/24/2018  Problem List:  Patient Active Problem List   Diagnosis Date Noted  . S/P bilateral below knee amputation ([place]) 11/23/2018  . Non-healing wound of lower extremity 11/17/2018  . MRSA bacteremia 11/04/2018  . Infection of amputation stump, right lower extremity ([place]) 11/04/2018  . Non-healing surgical wound 09/14/2018  . Chest pain, rule out acute myocardial infarction 09/07/2018  . Gangrene of toe of left foot ([place]) 08/05/2018  . 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[place]) 08/05/2018  . Gangrene of foot ([place]) 08/05/2018  . 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 ACS (acute coronary syndrome) ([place]) 10/28/2017  . Flash pulmonary edema ([place]) 06/04/2017  . Hypertensive emergency 06/04/2017  . 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 S/P BKA (below knee amputation) unilateral, left ([place])   . Post-operative pain   . Acute blood loss anemia   . Chronic combined systolic and diastolic CHF (congestive heart failure) ([place])   . PVD (peripheral vascular disease) ([place])   . Coronary artery disease involving native coronary artery of native heart with unstable angina pectoris ([place])   . Tobacco abuse   . Diabetic foot infection ([place]) 03/08/2017  . Osteomyelitis ([place]) 03/08/2017  . Wound dehiscence   . Dehiscence of amputation stump ([place]) 01/04/2017  . Gangrene of right foot ([place]) 08/02/2016  . Achilles tendon contracture, left 08/02/2016  . Anemia of renal disease 08/16/2015  . Wound infection 08/15/2015  . Diabetic ulcer of right great toe ([place]) 08/15/2015  . Pain in the chest   . Elevated troponin   . Essential hypertension   . ESRD on dialysis ([place]) 08/07/2013  . NSTEMI (non-ST elevated myocardial  infarction) ([place]) 05/16/2012  . Murmur 05/16/2012  . Renal disorder    Past Medical History:  Past Medical History:  Diagnosis Date  . Anemia   . Atherosclerosis of lower extremity ([place])   . Blood clot in vein    right calf  . CAD (coronary artery disease)   . Cataracts, bilateral   . Chronic combined systolic and diastolic heart failure ([place])   . Complication of anesthesia   . Depression   . ESRD (end stage renal disease) on dialysis [facility])    "MWF [employer]" (03/08/2017)  . GERD (gastroesophageal reflux disease)   . Heart murmur   . History of blood transfusion    "related to OR"  . Hypertension   . Myocardial infarction ([place])     " light"  . Nonhealing surgical wound    nonviable tissue  . PONV (postoperative nausea and vomiting)   . S/P unilateral BKA (below knee amputation), left ([place])   . Type II diabetes mellitus ([place])   . Wears glasses    Past Surgical History:  Past Surgical History:  Procedure Laterality Date  . ABDOMINAL AORTOGRAM N/A 11/02/2016   Procedure: Abdominal Aortogram;  Surgeon: Waynetta Sandy, MD;  Location: [place] [facility];  Service: Cardiovascular;  Laterality: N/A;  . ABDOMINAL AORTOGRAM W/LOWER EXTREMITY Right 08/07/2018   Procedure: ABDOMINAL AORTOGRAM W/LOWER EXTREMITY;  Surgeon: [name], MD;  Location: [place] [facility];  Service: Cardiovascular;  Laterality: Right;  . AMPUTATION Left 09/27/2013   Procedure: LEFT GREAT TOE AMPUTATION;  Surgeon: Beverely Low  Fernanda Drum, MD;  Location: [place];  Service: Orthopedics;  Laterality: Left;  . AMPUTATION Right 08/15/2015   Procedure: Right Great Toe Amputation;  Surgeon: [name], MD;  Location: [place];  Service: Orthopedics;  Laterality: Right;  . AMPUTATION Left 11/05/2016   Procedure: TRANSMETATARSAL AMPUTATION LEFT FOOT;  Surgeon: [name], MD;  Location: [place];  Service: Orthopedics;  Laterality: Left;  . AMPUTATION Left 03/11/2017   Procedure: LEFT BELOW KNEE AMPUTATION;   Surgeon: [name], MD;  Location: [place];  Service: Orthopedics;  Laterality: Left;  . AMPUTATION Right 03/11/2017   Procedure: RIGHT 2ND TOE AMPUTATION;  Surgeon: [name], MD;  Location: [place];  Service: Orthopedics;  Laterality: Right;  . AMPUTATION Right 11/17/2018   Procedure: AMPUTATION BELOW KNEE;  Surgeon: [name], MD;  Location: [place];  Service: Vascular;  Laterality: Right;  . AV FISTULA PLACEMENT  left arm  . CORONARY ARTERY BYPASS GRAFT N/A 11/04/2017   Procedure: CORONARY ARTERY BYPASS GRAFTING (CABG) x three, using left internal mammary artery and right    leg greater saphenous vein;  Surgeon: Melrose Nakayama, MD;  Location: [place];  Service: Open Heart Surgery;  Laterality: N/A;  . FASCIOTOMY Right 08/07/2018   Procedure: FOUR COMPARTMENT FASCIOTOMY OF RIGHT LOWER LEG;  Surgeon: Rosetta Posner, MD;  Location: [place];  Service: Vascular;  Laterality: Right;  . FASCIOTOMY CLOSURE Right 08/10/2018   Procedure: FASCIOTOMY CLOSURE RIGHT LOWER EXTREMITY;  Surgeon: [name], MD;  Location: [place];  Service: Vascular;  Laterality: Right;  . HEMATOMA EVACUATION Right 08/07/2018   Procedure: EVACUATION HEMATOMA;  Surgeon: Rosetta Posner, MD;  Location: [place];  Service: Vascular;  Laterality: Right;  . LEFT HEART CATH AND CORONARY ANGIOGRAPHY N/A 10/31/2017   Procedure: LEFT HEART CATH AND CORONARY ANGIOGRAPHY;  Surgeon: Troy Sine, MD;  Location: [place] [facility];  Service: Cardiovascular;  Laterality: N/A;  . LEFT HEART CATHETERIZATION WITH CORONARY ANGIOGRAM N/A 09/13/2014   Procedure: LEFT HEART CATHETERIZATION WITH CORONARY ANGIOGRAM;  Surgeon: Sinclair Grooms, MD;  Location: [facility] C[facility] LAB;  Service: Cardiovascular;  Laterality: N/A;  . LOWER EXTREMITY ANGIOGRAPHY Bilateral 11/02/2016   Procedure: Lower Extremity Angiography;  Surgeon: Waynetta Sandy, MD;  Location: [place] CV LAB;  Service: Cardiovascular;  Laterality: Bilateral;  .  PERIPHERAL VASCULAR ATHERECTOMY Left 11/02/2016   Procedure: Peripheral Vascular Atherectomy;  Surgeon: Waynetta Sandy, MD;  Location: [place] CV LAB;  Service: Cardiovascular;  Laterality: Left;  PERONEAL  . PERIPHERAL VASCULAR ATHERECTOMY Right 08/07/2018   Procedure: PERIPHERAL VASCULAR ATHERECTOMY;  Surgeon: [name], MD;  Location: [place] [facility];  Service: Cardiovascular;  Laterality: Right;  Peroneal artery  . PERIPHERAL VASCULAR BALLOON ANGIOPLASTY Right 08/07/2018   Procedure: PERIPHERAL VASCULAR BALLOON ANGIOPLASTY;  Surgeon: [name], MD;  Location: [place] [facility];  Service: Cardiovascular;  Laterality: Right;  anterior tibial artery  . STUMP REVISION Left 01/11/2017   Procedure: Revision Left Transmetatarsal Amputation;  Surgeon: [name], MD;  Location: [place];  Service: Orthopedics;  Laterality: Left;  . TEE WITHOUT CARDIOVERSION N/A 11/04/2017   Procedure: TRANSESOPHAGEAL ECHOCARDIOGRAM (TEE);  Surgeon: Melrose Nakayama, MD;  Location: [place];  Service: Open Heart Surgery;  Laterality: N/A;  . TRANSMETATARSAL AMPUTATION Right 08/10/2018   Procedure: RIGHT FIFTH TOE AMPUTATION;  Surgeon: [name], MD;  Location: [place];  Service: Vascular;  Laterality: Right;  .  TRANSMETATARSAL AMPUTATION Right 09/14/2018   Procedure: TRANSMETATARSAL AMPUTATION;  Surgeon: [name], MD;  Location: [place];  Service: Vascular;  Laterality: Right;   Social History:  reports that he quit smoking about 4 weeks ago. His smoking use included cigarettes. He has a 13.00 pack-year smoking history. He has never used smokeless tobacco. He reports previous alcohol use. He reports previous drug use. Drug: Marijuana.  Family / Support Systems Marital Status: Single Patient Roles: Partner, Parent, Other (Comment)(sibling) Spouse/Significant Other: Clearnce Hasten [phone] Other Supports: [place] Casciano-sister [phone] Anticipated  Caregiver: Sister and niece Ability/Limitations of Caregiver: Sister is a Therapist, sports and on HD and niece can check on him. [place] is ill and currently hospitalized Caregiver Availability: Intermittent Family Dynamics: Close knit with family-children and siblings. Have all pulled together since pt and [place] have been ill and hospitalized. They both have friends and fellow HD pts who arer supportive.  Social History Preferred language: English Religion: Christian Cultural Background: No issues Education: High School Read: Yes Write: Yes Employment Status: Disabled Public relations account executive Issues: No issues Guardian/Conservator: None-according MD pt is capable to make decisions while here   Abuse/Neglect Abuse/Neglect Assessment Can Be Completed: Yes Physical Abuse: Denies Verbal Abuse: Denies Sexual Abuse: Denies Exploitation of patient/patient's resources: Denies Self-Neglect: Denies  Emotional Status Pt's affect, behavior and adjustment status: Pt is motivated to do well and recover from his surgery. He has learned once how to manage but realizes this will be more of a challenge. He is glad to be here on rehab instead of a SNF. He is ready to start his first day on rehab Recent Psychosocial Issues: other health issues were being managed Psychiatric History: No history deferred depression screen due to pt seems to be coping appropriately but do feel he would benefit from seeing neuor-psych while here with his illness and his girlfreinds. Substance Abuse History: Quit tobacco one month ago  Patient / Family Perceptions, Expectations & Goals Pt/Family understanding of illness & functional limitations: Pt is able to explain his amputation and the challenges he faces now. He does talk with the MD and feels he has a good understanding of his treatment plan going forward.  Premorbid pt/family roles/activities: Boyfriend, father, brother, friend, HD pt, etc Anticipated changes in  roles/activities/participation: resume Pt/family expectations/goals: Pt states: " I want to be wheelchair independent before I leave here, I have to be alone so I need to be."  Sister states, via phone: " We will do what we can but work and have families."  [employer]: Other (Comment)(Been to [place] with last amputation) Premorbid Home Care/DME Agencies: Other (Comment)(Active pt with [facility] and has DME) Transportation available at discharge: [employer] takes to HD and family does the other transport  Resource referrals recommended: Support group (specify), Neuropsychology  Discharge Planning Living Arrangements: Spouse/significant other Support Systems: Spouse/significant other, Other relatives, Friends/neighbors Type of Residence: Private residence Insurance Resources: [employer] (specify county) Museum/gallery curator Resources: [employer] Screen Referred: No Living Expenses: Education officer, community Management: Patient, Significant Other Does the patient have any problems obtaining your medications?: No Home Management: [place] and Nurse, mental health Plans: Return home with [employer] worker to resume-5 hrs per day and [place] but she is currently hospitalized herself. Pt's sister will check in on him along with his niece. Will await team evaluations and work on discharge needs. Sw Barriers to Discharge: Decreased caregiver support Sw Barriers to Discharge Comments: Caregiver is hosptialized at this time-[place] Social Work Anticipated Follow Up Needs:  HH/OP, Support Group  Clinical Impression Pleasant gentleman who is motivated to recover and be as independent as he can be at least mod/i wheelchair level. He is coping well considering his girlfriend is currently hospitalized also, but improving daily. Will make referral for neuro-psych and work on discharge needs.  Elease Hashimoto 11/24/2018, 9:29 AM

## 2018-11-24 NOTE — Progress Notes (Signed)
[facility] PHYSICAL MEDICINE & REHABILITATION PROGRESS NOTE  Subjective/Complaints: Patient seen sitting up working with therapy this morning.  He states he slept well overnight.  He is unable to fully extend his right lower extremity.  ROS: Denies CP, shortness of breath, nausea, vomiting, diarrhea.  Objective: Vital Signs: Blood pressure (!) 129/34, pulse 79, temperature 98.1 F (36.7 C), temperature source Oral, resp. rate 15, height 6\' 2"  (1.88 m), weight 70.9 kg, SpO2 100 %. No results found. Recent Labs    11/22/18 0840  WBC 10.0  HGB 7.0*  HCT 23.5*  PLT 761*   Recent Labs    11/22/18 0840  NA 126*  K 3.5  CL 92*  CO2 22  GLUCOSE 175*  BUN 49*  CREATININE 9.41*  CALCIUM 6.9*    Physical Exam: BP (!) 129/34 (BP Location: Right Arm)   Pulse 79   Temp 98.1 F (36.7 C) (Oral)   Resp 15   Ht 6\' 2"  (1.88 m)   Wt 70.9 kg   SpO2 100%   BMI 20.07 kg/m  Constitutional: No distress . Vital signs reviewed. HENT: Normocephalic.  Atraumatic. Eyes: EOMI. No discharge. Cardiovascular: No JVD. Respiratory: Normal effort. GI: Non-distended. Musc: Right BKA with edema and tenderness, unable to fully extend  Left BKA Neurological:Alert and oriented. Follows commands.  Motor: Bilateral upper extremities: 5/5 proximal distal Left lower extremity: Hip flexion, knee extension 5/5 Right lower extremity: Hip flexion, knee extension 3+/5 (pain inhibition) Skin: Right BKA with staples C/D/I Left healed BKA Psych: Flat.  Assessment/Plan: 1. Functional deficits secondary to right BKA with history of left BKA which require 3+ hours per day of interdisciplinary therapy in a comprehensive inpatient rehab setting.  Physiatrist is providing close team supervision and 24 hour management of active medical problems listed below.  Physiatrist and rehab team continue to assess barriers to discharge/monitor patient progress toward functional and medical goals  Care  Tool:  Bathing              Bathing assist       Upper Body Dressing/Undressing Upper body dressing        Upper body assist      Lower Body Dressing/Undressing Lower body dressing            Lower body assist       Toileting Toileting    Toileting assist       Transfers Chair/bed transfer  Transfers assist     Chair/bed transfer assist level: Supervision/Verbal cueing     Locomotion Ambulation   Ambulation assist   Ambulation activity did not occur: N/A(bilat amputee)          Walk 10 feet activity   Assist  Walk 10 feet activity did not occur: N/A        Walk 50 feet activity   Assist Walk 50 feet with 2 turns activity did not occur: N/A         Walk 150 feet activity   Assist Walk 150 feet activity did not occur: N/A         Walk 10 feet on uneven surface  activity   Assist Walk 10 feet on uneven surfaces activity did not occur: N/A         Wheelchair     Assist Will patient use wheelchair at discharge?: Yes Type of Wheelchair: Manual    Wheelchair assist level: Supervision/Verbal cueing Max wheelchair distance: 150    Wheelchair 50 feet with 2 turns activity  Assist        Assist Level: Supervision/Verbal cueing   Wheelchair 150 feet activity     Assist     Assist Level: Supervision/Verbal cueing      Medical Problem List and Plan: 1.Decreased functional mobilitysecondary to right BKA 11/17/2018 after failed TMA as well as history of left BKA July 2018  Begin CIR  Notes reviewed, labs reviewed  Stump shrinker ordered 2. Antithrombotics: -DVT/anticoagulation:Subcutaneous heparin. -antiplatelet therapy: aspirin 81 mg daily, Plavix 75 mg daily 3. Pain Management:MSIR 15 mg every 4 hours as needed  Monitor with increased mobility 4. Mood:Provide emotional support -antipsychotic agents: N/A 5. Neuropsych: This patientiscapable of making  decisions on hisown behalf. 6. Skin/Wound Care:routine skin checks 7. Fluids/Electrolytes/Nutrition:routine ins and outs  8. End-stage renal disease with hemodialysis. Follow-up renal services 9. Acute on chronic anemia.   Hemoglobin 7.0 on 4/8  Recs per nephro 10. ID. Continue vancomycin through 12/16/2018 for positive blood cultures 11/03/2018 11. Diabetes mellitus.NovoLog 3 units 3 times a day with meals, Lantus insulin 14 units daily at bedtime  Monitor with increased mobility 12. CAD with CABG. Continue aspirin and Plavix.Imdur 30 mg daily, Coreg 6.25 mg twice a day 13. Hyperlipidemia. Lipitor 14. Constipation. Laxative assistance  LOS: 1 days A FACE TO FACE EVALUATION WAS PERFORMED  Hadasah Brugger Lorie Phenix 11/24/2018, 11:19 AM

## 2018-11-24 NOTE — Progress Notes (Signed)
Inpatient Rehabilitation  Patient information reviewed and entered into eRehab system by Mazie Fencl M. Krystian Younglove, M.A., CCC/SLP, PPS Coordinator.  Information including medical coding, functional ability and quality indicators will be reviewed and updated through discharge.    

## 2018-11-24 NOTE — Progress Notes (Signed)
Orthopedic Tech Progress Note Patient Details:  [name] Neeson Sr. 01/26/1976 [id number]  Patient ID: [name], male   DOB: 29-Oct-1975, 43 y.o.   MRN: [id number] Called in order to [place] 11/24/2018, 11:46 AM

## 2018-11-24 NOTE — Care Management Note (Signed)
[facility] Individual Statement of Services  Patient Name:  [name] Isabell Sr.  Date:  11/24/2018  Welcome to the [facility].  Our goal is to provide you with an individualized program based on your diagnosis and situation, designed to meet your specific needs.  With this comprehensive rehabilitation program, you will be expected to participate in at least 3 hours of rehabilitation therapies Monday-Friday, with modified therapy programming on the weekends.  Your rehabilitation program will include the following services:  Physical Therapy (PT), Occupational Therapy (OT), 24 hour per day rehabilitation nursing, Therapeutic Recreaction (TR), Neuropsychology, Case Management (Social Worker), Rehabilitation Medicine, Nutrition Services and Pharmacy Services  Weekly team conferences will be held on Wednesday to discuss your progress.  Your Social Worker will talk with you frequently to get your input and to update you on team discussions.  Team conferences with you and your family in attendance may also be held.  Expected length of stay: 5-7 days  Overall anticipated outcome: independent with device  Depending on your progress and recovery, your program may change. Your Social Worker will coordinate services and will keep you informed of any changes. Your Social Worker's name and contact numbers are listed  below.  The following services may also be recommended but are not provided by the [facility]:    [place] will be made to provide these services after discharge if needed.  Arrangements include referral to agencies that provide these services.  Your insurance has been verified to be:  Medicaid Your primary doctor is:  Sandi Mariscal  Pertinent information will be shared with your doctor and your insurance company.  Social Worker:  Ovidio Kin, [place] or [phone]  Information discussed with and copy given to patient by: Elease Hashimoto, 11/24/2018, 9:31 AM

## 2018-11-24 NOTE — Evaluation (Signed)
Occupational Therapy Assessment and Plan  Patient Details  Name: [name] Wrightson Sr. MRN: [id number] Date of Birth: 1976-01-21  OT Diagnosis: muscle weakness (generalized) and R BKA Rehab Potential: Rehab Potential (ACUTE ONLY): Good ELOS: 5-7 days   Today's Date: 11/24/2018 OT Individual Time: 7829-5621 and 1100-1155 OT Individual Time Calculation (min): 65 min   And 55 min  Problem List:  Patient Active Problem List   Diagnosis Date Noted  . Unilateral complete BKA, right, initial encounter ([place])   . Diabetes mellitus type 2 in nonobese (HCC)   . Bacteremia   . Postoperative pain   . S/P bilateral below knee amputation ([place]) 11/23/2018  . Non-healing wound of lower extremity 11/17/2018  . MRSA bacteremia 11/04/2018  . Infection of amputation stump, right lower extremity ([place]) 11/04/2018  . Non-healing surgical wound 09/14/2018  . Chest pain, rule out acute myocardial infarction 09/07/2018  . Gangrene of toe of left foot ([place]) 08/05/2018  . 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 Gangrene of foot ([place]) 08/05/2018  . 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 ACS (acute coronary syndrome) ([place]) 10/28/2017  . Flash pulmonary edema ([place]) 06/04/2017  . Hypertensive emergency 06/04/2017  . 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 S/P BKA (below knee amputation) unilateral, left ([place])   . Post-operative pain   . Acute blood loss anemia   . Chronic combined systolic and diastolic CHF (congestive heart failure) ([place])   . PVD (peripheral vascular disease) ([place])   . Coronary artery disease involving native coronary artery of native heart with unstable angina pectoris ([place])   . Tobacco abuse   . Diabetic foot infection ([place]) 03/08/2017  . Osteomyelitis ([place]) 03/08/2017  . Wound dehiscence   . Dehiscence of amputation stump ([place])  01/04/2017  . Gangrene of right foot ([place]) 08/02/2016  . Achilles tendon contracture, left 08/02/2016  . Anemia of renal disease 08/16/2015  . Wound infection 08/15/2015  . Diabetic ulcer of right great toe ([place]) 08/15/2015  . Pain in the chest   . Elevated troponin   . Essential hypertension   . ESRD on dialysis ([place]) 08/07/2013  . NSTEMI (non-ST elevated myocardial infarction) ([place]) 05/16/2012  . Murmur 05/16/2012  . Renal disorder     Past Medical History:  Past Medical History:  Diagnosis Date  . Anemia   . Atherosclerosis of lower extremity ([place])   . Blood clot in vein    right calf  . CAD (coronary artery disease)   . Cataracts, bilateral   . Chronic combined systolic and diastolic heart failure ([place])   . Complication of anesthesia   . Depression   . ESRD (end stage renal disease) on dialysis [facility])    "MWF [employer]" (03/08/2017)  . GERD (gastroesophageal reflux disease)   . Heart murmur   . History of blood transfusion    "related to OR"  . Hypertension   . Myocardial infarction ([place])     " light"  . Nonhealing surgical wound    nonviable tissue  . PONV (postoperative nausea and vomiting)   . S/P unilateral BKA (below knee amputation), left ([place])   . Type II diabetes mellitus ([place])   . Wears glasses    Past Surgical History:  Past Surgical History:  Procedure Laterality Date  . ABDOMINAL AORTOGRAM N/A 11/02/2016   Procedure: Abdominal Aortogram;  Surgeon: Waynetta Sandy, MD;  Location: [place] [facility] LAB;  Service: Cardiovascular;  Laterality: N/A;  . ABDOMINAL AORTOGRAM W/LOWER EXTREMITY Right 08/07/2018   Procedure: ABDOMINAL AORTOGRAM W/LOWER EXTREMITY;  Surgeon: [name], MD;  Location: [place] [facility];  Service: Cardiovascular;  Laterality: Right;  . AMPUTATION Left 09/27/2013   Procedure: LEFT GREAT TOE AMPUTATION;  Surgeon: [name], MD;  Location: [place];  Service: Orthopedics;  Laterality: Left;  . AMPUTATION Right  08/15/2015   Procedure: Right Great Toe Amputation;  Surgeon: [name], MD;  Location: [place];  Service: Orthopedics;  Laterality: Right;  . AMPUTATION Left 11/05/2016   Procedure: TRANSMETATARSAL AMPUTATION LEFT FOOT;  Surgeon: [name], MD;  Location: [place];  Service: Orthopedics;  Laterality: Left;  . AMPUTATION Left 03/11/2017   Procedure: LEFT BELOW KNEE AMPUTATION;  Surgeon: [name], MD;  Location: [place];  Service: Orthopedics;  Laterality: Left;  . AMPUTATION Right 03/11/2017   Procedure: RIGHT 2ND TOE AMPUTATION;  Surgeon: [name], MD;  Location: [place];  Service: Orthopedics;  Laterality: Right;  . AMPUTATION Right 11/17/2018   Procedure: AMPUTATION BELOW KNEE;  Surgeon: [name], MD;  Location: [place];  Service: Vascular;  Laterality: Right;  . AV FISTULA PLACEMENT  left arm  . CORONARY ARTERY BYPASS GRAFT N/A 11/04/2017   Procedure: CORONARY ARTERY BYPASS GRAFTING (CABG) x three, using left internal mammary artery and right    leg greater saphenous vein;  Surgeon: Melrose Nakayama, MD;  Location: [place];  Service: Open Heart Surgery;  Laterality: N/A;  . FASCIOTOMY Right 08/07/2018   Procedure: FOUR COMPARTMENT FASCIOTOMY OF RIGHT LOWER LEG;  Surgeon: Rosetta Posner, MD;  Location: [place];  Service: Vascular;  Laterality: Right;  . FASCIOTOMY CLOSURE Right 08/10/2018   Procedure: FASCIOTOMY CLOSURE RIGHT LOWER EXTREMITY;  Surgeon: [name], MD;  Location: [place];  Service: Vascular;  Laterality: Right;  . HEMATOMA EVACUATION Right 08/07/2018   Procedure: EVACUATION HEMATOMA;  Surgeon: Rosetta Posner, MD;  Location: [place];  Service: Vascular;  Laterality: Right;  . LEFT HEART CATH AND CORONARY ANGIOGRAPHY N/A 10/31/2017   Procedure: LEFT HEART CATH AND CORONARY ANGIOGRAPHY;  Surgeon: Troy Sine, MD;  Location: [place] CV LAB;  Service: Cardiovascular;  Laterality: N/A;  . LEFT HEART CATHETERIZATION WITH CORONARY ANGIOGRAM N/A 09/13/2014    Procedure: LEFT HEART CATHETERIZATION WITH CORONARY ANGIOGRAM;  Surgeon: Sinclair Grooms, MD;  Location: [facility] CATH LAB;  Service: Cardiovascular;  Laterality: N/A;  . LOWER EXTREMITY ANGIOGRAPHY Bilateral 11/02/2016   Procedure: Lower Extremity Angiography;  Surgeon: Waynetta Sandy, MD;  Location: [place] CV LAB;  Service: Cardiovascular;  Laterality: Bilateral;  . PERIPHERAL VASCULAR ATHERECTOMY Left 11/02/2016   Procedure: Peripheral Vascular Atherectomy;  Surgeon: Waynetta Sandy, MD;  Location: [place] CV LAB;  Service: Cardiovascular;  Laterality: Left;  PERONEAL  . PERIPHERAL VASCULAR ATHERECTOMY Right 08/07/2018   Procedure: PERIPHERAL VASCULAR ATHERECTOMY;  Surgeon: [name], MD;  Location: [place] CV LAB;  Service: Cardiovascular;  Laterality: Right;  Peroneal artery  . PERIPHERAL VASCULAR BALLOON ANGIOPLASTY Right 08/07/2018   Procedure: PERIPHERAL VASCULAR BALLOON ANGIOPLASTY;  Surgeon: [name], MD;  Location: [place] CV LAB;  Service: Cardiovascular;  Laterality: Right;  anterior tibial artery  . STUMP REVISION Left 01/11/2017   Procedure: Revision Left Transmetatarsal Amputation;  Surgeon: [name], MD;  Location: [place];  Service: Orthopedics;  Laterality: Left;  . TEE WITHOUT CARDIOVERSION N/A 11/04/2017   Procedure: TRANSESOPHAGEAL ECHOCARDIOGRAM (TEE);  Surgeon: Melrose Nakayama, MD;  Location: [place];  Service: Open Heart Surgery;  Laterality: N/A;  . TRANSMETATARSAL AMPUTATION Right 08/10/2018   Procedure: RIGHT FIFTH TOE AMPUTATION;  Surgeon: [name], MD;  Location: [place];  Service: Vascular;  Laterality: Right;  . TRANSMETATARSAL AMPUTATION Right 09/14/2018   Procedure: TRANSMETATARSAL AMPUTATION;  Surgeon: [name], MD;  Location: [facility] OR;  Service: Vascular;  Laterality: Right;    Assessment & Plan Clinical Impression: Patient is a 43 y.o. year old male with history of CAD/CABG maintained  on aspirin and Plavix, end-stage renal disease with hemodialysis Monday Wednesday Friday, diabetes mellitus, hypertension,left BKA July 2018 discharged to skilled nursing facility. Per chart review patient lives subsidized housing. Has a home health aid 5 hours a day. Used a left prosthesis in wheelchair. He has a girlfriend good support as well as a sister and niece in the area. Presented 11/17/2018 with noted multiple revascularization procedures of right lower extremity and right second toe amputation and poor healing. 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 Monica Martinez. Hospital course pain management. Patient remains on vancomycin through 12/16/2018 for blood cultures back on 11/03/2018 that were positive. Hemodialysis ongoing as per renal services. Acute on chronic anemia 7.0 and monitored. Subcutaneous for DVT prophylaxis.  Patient transferred to CIR on 11/23/2018 .    Patient currently requires min with basic self-care skills and IADL secondary to muscle weakness and decreased sitting balance.  Prior to hospitalization, patient could complete adl with modified independent .  Patient will benefit from skilled intervention to decrease level of assist with basic self-care skills, increase independence with basic self-care skills and increase level of independence with iADL prior to discharge home with care partner.  Anticipate patient will require intermittent supervision and follow up home health.  OT - End of Session Activity Tolerance: Tolerates 10 - 20 min activity with multiple rests Endurance Deficit: Yes Endurance Deficit Description: fatigue and pain limit tolerance OT Assessment Rehab Potential (ACUTE ONLY): Good OT Patient demonstrates impairments in the following area(s): Balance;Endurance;Pain OT Basic ADL's Functional Problem(s): Grooming;Bathing;Dressing;Toileting OT Advanced ADL's Functional Problem(s): Simple Meal Preparation;Light  Housekeeping OT Transfers Functional Problem(s): Toilet;Tub/Shower OT Plan OT Intensity: Minimum of 1-2 x/day, 45 to 90 minutes OT Frequency: 5 out of 7 days OT Duration/Estimated Length of Stay: 5-7 days OT Treatment/Interventions: Balance/vestibular training;Discharge planning;Pain management;Self Care/advanced ADL retraining;Therapeutic Activities;Functional mobility training;Patient/family education;Skin care/wound managment;Therapeutic Exercise;Community reintegration;DME/adaptive equipment instruction OT Basic Self-Care Anticipated Outcome(s): mod I OT Toileting Anticipated Outcome(s): mod I OT Bathroom Transfers Anticipated Outcome(s): mod I OT Recommendation Patient destination: Home Follow Up Recommendations: Home health OT Equipment Details: owns shower bench (fold down style), handicap height toilet, grab bars at shower and toilet,    Skilled Therapeutic Intervention AM session:  Patient in bed upon arrival.  Reviewed therapy schedule, role of OT, plan of care and therapy plan for today.  Evaluation completed as documented below.  Bed mobility with CS, anterior/posterior transfer to/from bed and w/c with CG A and cues for safety.  Bathing completed at sink in w/c as patient is not yet cleared to shower.   UB dressing and LB dressing completed at w/c level with incidental assist and initial instruction/set up.  Reviewed home set up and goals for therapy.  He returned to bed for a rest break at close of session.  Bed  alarm set and call bell in reach.  11:00 session:  Patient in bed and agreeable to attend follow up session this morning.  Ant/post transfer from bed to w/c with CG A.  Reviewed bathroom equipment and technique but patient declined attempting transfers due to pain.  Completed upper body conditioning activities with good tolerance.  Lateral transfer w/c to mat with CG A - completed hip stretching and core strengthening activities.  Patient returned to bed at close of session -  he is performing well at a w/c level.  Call bell and bed alarm set at close of session.    OT Evaluation Precautions/Restrictions  Precautions Precautions: Fall Precaution Comments: Prior Left BKA; left prosthetic. Recent R BKA.  Restrictions Weight Bearing Restrictions: Yes RLE Weight Bearing: Non weight bearing General Chart Reviewed: Yes PT Missed Treatment Reason: Pain Vital Signs  Pain Pain Assessment Pain Scale: 0-10 Pain Score: 6  Faces Pain Scale: Hurts whole lot Pain Type: Acute pain;Surgical pain Pain Location: Leg Pain Orientation: Right Pain Descriptors / Indicators: Aching Pain Onset: On-going Pain Intervention(s): Repositioned;RN made aware;Elevated extremity Home Living/Prior Functioning Home Living Family/patient expects to be discharged to:: Private residence Living Arrangements: Spouse/significant other Available Help at Discharge: Personal care attendant Type of Home: Apartment Home Access: Level entry Home Layout: One level Bathroom Shower/Tub: Chiropodist: Handicapped height Bathroom Accessibility: Yes Additional Comments: Wheelchair accessible apartment  Lives With: Significant other Prior Function Level of Independence: Requires assistive device for independence  Able to Take Stairs?: No Driving: No Vocation: On disability Comments: Aide comes in 5 days/week, 5 hrs per day. Does cooking/cleaning.  ADL ADL Eating: Independent Where Assessed-Eating: Bed level Grooming: Independent Where Assessed-Grooming: Sitting at sink Upper Body Bathing: Setup Where Assessed-Upper Body Bathing: Sitting at sink Lower Body Bathing: Contact guard Where Assessed-Lower Body Bathing: Sitting at sink Upper Body Dressing: Setup Where Assessed-Upper Body Dressing: Wheelchair Lower Body Dressing: Supervision/safety, Minimal cueing Where Assessed-Lower Body Dressing: Wheelchair Toileting: Not assessed ADL Comments: not cleared to shower,  patient declines toilet transfer Vision Baseline Vision/History: No visual deficits Patient Visual Report: No change from baseline Vision Assessment?: Yes Eye Alignment: Within Functional Limits Ocular Range of Motion: Within Functional Limits Alignment/Gaze Preference: Within Defined Limits Tracking/Visual Pursuits: Able to track stimulus in all quads without difficulty Saccades: Within functional limits Convergence: Within functional limits Visual Fields: No apparent deficits Perception  Perception: Within Functional Limits Praxis Praxis: Intact Cognition Overall Cognitive Status: Within Functional Limits for tasks assessed Arousal/Alertness: Awake/alert Orientation Level: Person;Place;Situation Person: Oriented Place: Oriented Situation: Oriented Year: 2020 Month: April Day of Week: Correct Memory: Appears intact Immediate Memory Recall: Sock;Blue;Bed Memory Recall: Sock;Blue;Bed Memory Recall Sock: Without Cue Memory Recall Blue: Without Cue Memory Recall Bed: Without Cue Attention: Focused Focused Attention: Appears intact Awareness: Appears intact Problem Solving: Appears intact Safety/Judgment: Appears intact Sensation Sensation Light Touch: Appears Intact Light Touch Impaired Details: Impaired RLE Proprioception: Appears Intact Coordination Gross Motor Movements are Fluid and Coordinated: Yes Fine Motor Movements are Fluid and Coordinated: Yes Motor  Motor Motor: Within Functional Limits Motor - Skilled Clinical Observations: generalized weakness Mobility  Bed Mobility Bed Mobility: Supine to Sit;Sit to Supine Supine to Sit: Supervision/Verbal cueing Sit to Supine: Supervision/Verbal cueing  Trunk/Postural Assessment  Cervical Assessment Cervical Assessment: Within Functional Limits Thoracic Assessment Thoracic Assessment: Within Functional Limits Lumbar Assessment Lumbar Assessment: Within Functional Limits Postural Control Postural Control:  Within Functional Limits  Balance Dynamic Sitting Balance Sitting balance - Comments: Able to maintain static  sitting balance with supervision Extremity/Trunk Assessment RUE Assessment RUE Assessment: Within Functional Limits LUE Assessment LUE Assessment: Within Functional Limits     Refer to Care Plan for Long Term Goals  Recommendations for other services: None    Discharge Criteria: Patient will be discharged from OT if patient refuses treatment 3 consecutive times without medical reason, if treatment goals not met, if there is a change in medical status, if patient makes no progress towards goals or if patient is discharged from hospital.  The above assessment, treatment plan, treatment alternatives and goals were discussed and mutually agreed upon: by patient  Carlos Levering 11/24/2018, 12:31 PM

## 2018-11-25 ENCOUNTER — Inpatient Hospital Stay (HOSPITAL_COMMUNITY): Payer: Medicaid Other | Admitting: Occupational Therapy

## 2018-11-25 ENCOUNTER — Inpatient Hospital Stay (HOSPITAL_COMMUNITY): Payer: Medicaid Other

## 2018-11-25 DIAGNOSIS — E119 Type 2 diabetes mellitus without complications: Secondary | ICD-10-CM

## 2018-11-25 DIAGNOSIS — S88111A Complete traumatic amputation at level between knee and ankle, right lower leg, initial encounter: Secondary | ICD-10-CM

## 2018-11-25 DIAGNOSIS — R7881 Bacteremia: Secondary | ICD-10-CM

## 2018-11-25 DIAGNOSIS — Z89512 Acquired absence of left leg below knee: Secondary | ICD-10-CM

## 2018-11-25 DIAGNOSIS — G8918 Other acute postprocedural pain: Secondary | ICD-10-CM

## 2018-11-25 DIAGNOSIS — Z89511 Acquired absence of right leg below knee: Secondary | ICD-10-CM

## 2018-11-25 LAB — GLUCOSE, CAPILLARY
Glucose-Capillary: 164 mg/dL — ABNORMAL HIGH (ref 70–99)
Glucose-Capillary: 147 mg/dL — ABNORMAL HIGH (ref 70–99)
Glucose-Capillary: 134 mg/dL — ABNORMAL HIGH (ref 70–99)
Glucose-Capillary: 171 mg/dL — ABNORMAL HIGH (ref 70–99)

## 2018-11-25 MED ORDER — VANCOMYCIN HCL 1000 MG IV SOLR
1000.0000 mg | INTRAVENOUS | Status: DC
  Filled 2018-11-25: qty 1000

## 2018-11-25 NOTE — Progress Notes (Signed)
Physical Therapy Session Note  Patient Details  Name: [name]. MRN: [id number] Date of Birth: 12/27/1975  Today's Date: 11/25/2018 PT Individual Time: 1505-1600 PT Individual Time Calculation (min): 55 min   Short Term Goals: Week 1:  PT Short Term Goal 1 (Week 1): = LTG  Skilled Therapeutic Interventions/Progress Updates:    Patient supine and performing rolling independently, supine to sit S.  Scooting A-P transfer to w/c with S.  Set up for w/c with legrests with S.  Performed w/c mobility to ortho gym.  Set up for transfer S and onto mat A-P transfer S.  Seated EOM for SAQ x 10 (unable to fully straighten L knee & reports doesn't feel will fully straighten until staples are out as was the case with other leg.)  Sit to supine with S.  Performed supine SLR, bridge on green therapy ball, lateral trunk rolls, sidelying hip abduction and hip extension (refused prone due to pain).  Supine to sit S and transfer to chair S.  Propelled to room then down to 1st floor and outside with S.  Propelled around picnic tables and around fountain on unlevel surface with S.  Returned to room and back to bed with S after w/c set up.  Encouraged in room therex and wrote on board for leg extension, adductor sets, side hip abduction and extension.  Patient left in supine with call bell and needs in reach bed alarm activated.    Therapy Documentation Precautions:  Precautions Precautions: Fall Precaution Comments: Prior Left BKA; left prosthetic. Recent R BKA.  Restrictions Weight Bearing Restrictions: Yes RLE Weight Bearing: Non weight bearing Pain: Pain Assessment Pain Scale: 0-10 Pain Score: 6  Pain Type: Surgical pain Pain Location: Leg Pain Orientation: Right Pain Descriptors / Indicators: Aching Pain Onset: With Activity Pain Intervention(s): Repositioned;Rest    Therapy/Group: Individual Therapy  Reginia Naas  [place], [place] 11/25/2018, 5:27 PM

## 2018-11-25 NOTE — Plan of Care (Signed)
  Problem: Consults Goal: RH LIMB LOSS PATIENT EDUCATION Description Description: See Patient Education module for eduction specifics. Outcome: Progressing Goal: Skin Care Protocol Initiated - if Braden Score 18 or less Description If consults are not indicated, leave blank or document N/A Outcome: Progressing Goal: Nutrition Consult-if indicated Outcome: Progressing Goal: Diabetes Guidelines if Diabetic/Glucose > 140 Description If diabetic or lab glucose is > 140 mg/dl - Initiate Diabetes/Hyperglycemia Guidelines & Document Interventions  Outcome: Progressing Goal: RH LIMB LOSS PATIENT EDUCATION Description Description: See Patient Education module for eduction specifics. Outcome: Progressing   Problem: RH BOWEL ELIMINATION Goal: RH STG MANAGE BOWEL WITH ASSISTANCE Description STG Manage Bowel with Min Assistance.  Outcome: Progressing Goal: RH STG MANAGE BOWEL W/MEDICATION W/ASSISTANCE Description STG Manage Bowel with Medication with [place].  Outcome: Progressing   Problem: RH BLADDER ELIMINATION Goal: RH STG MANAGE BLADDER WITH ASSISTANCE Description STG Manage Bladder With Min Assistance  Outcome: Progressing   Problem: RH SKIN INTEGRITY Goal: RH STG SKIN FREE OF INFECTION/BREAKDOWN Description Monitor skin for skin breakdown. Patient skin is intact during shift.  Outcome: Progressing Goal: RH STG MAINTAIN SKIN INTEGRITY WITH ASSISTANCE Description STG Maintain Skin Integrity With [place].  Outcome: Progressing Goal: RH STG ABLE TO PERFORM INCISION/WOUND CARE W/ASSISTANCE Description STG Able To Perform Incision/Wound Care With Assistance. Outcome: Progressing   Problem: RH SAFETY Goal: RH STG ADHERE TO SAFETY PRECAUTIONS W/ASSISTANCE/DEVICE Description STG Adhere to Safety Precautions With Min Assistance/Device.  Outcome: Progressing   Problem: RH PAIN MANAGEMENT Goal: RH STG PAIN MANAGED AT OR BELOW PT'S PAIN GOAL Description Patient  will be pain free or pain less than 3 during admission  Outcome: Progressing   Problem: RH KNOWLEDGE DEFICIT LIMB LOSS Goal: RH STG INCREASE KNOWLEDGE OF SELF CARE AFTER LIMB LOSS Outcome: Progressing

## 2018-11-25 NOTE — Progress Notes (Signed)
Occupational Therapy Session Note  Patient Details  Name: [name] Laday Sr. MRN: [id number] Date of Birth: 20-Feb-1976  Today's Date: 11/25/2018 OT Individual Time: 1245-1400 OT Individual Time Calculation (min): 75 min    Short Term Goals: Week 1:  OT Short Term Goal 1 (Week 1): NA due to short LOS anticipated  Skilled Therapeutic Interventions/Progress Updates:    Patient in bed finishing lunch, states that he is tired but willing to participate in therapy session this afternoon.  LB dressing mod I.  Bed mobility is independent, ant/post transfer to/from bed with CS/set up.  Patient able to set up w/c for transfers to/from shower bench, hospital height toilet and mat table with min cues.  Lateral and ant/posterior transfers completed to each surface with CG/CS to ensure safety.  Patient able to perform with good balance and safe technique.   Patient completed conditioning and core strengthening exercises on mat table with fair endurance.  He c/o occ right shoulder discomfort with activity over head - modified OH reach with less pain.  He completed stretching activity in supine on mat with good results.  Reviewed basic HM at w/c level with good verbal strategies.  He reports that he was completing some HM tasks prior to admission at a w/c level. Patient returned to room and transferred to bed CS/DS, bed alarm set and call bell in reach.    Therapy Documentation Precautions:  Precautions Precautions: Fall Precaution Comments: Prior Left BKA; left prosthetic. Recent R BKA.  Restrictions Weight Bearing Restrictions: Yes RLE Weight Bearing: Non weight bearing General:   Vital Signs: Therapy Vitals Temp: 98.6 F (37 C) Pulse Rate: 85 BP: (!) 118/37 Oxygen Therapy SpO2: 100 % Pain: Pain Assessment Pain Scale: 0-10 Pain Score: 8  Pain Type: Acute pain Pain Location: Leg Pain Orientation: Right Pain Descriptors / Indicators: Aching Pain Frequency: Constant Pain Onset:  On-going Patients Stated Pain Goal: 1 Pain Intervention(s): Repositioned;RN made aware;Massage   Other Treatments:     Therapy/Group: Individual Therapy  Carlos Levering 11/25/2018, 3:01 PM

## 2018-11-25 NOTE — Progress Notes (Signed)
Occupational Therapy Session Note  Patient Details  Name: [name] Kendall Sr. MRN: [id number] Date of Birth: 03-17-1976  Today's Date: 11/25/2018 OT Individual Time: 9390-3009 OT Individual Time Calculation (min): 45 min    Short Term Goals: Week 1:  OT Short Term Goal 1 (Week 1): NA due to short LOS anticipated  Skilled Therapeutic Interventions/Progress Updates:    Patient in bed, reports significant pain in residual limb and poor sleep overnight but agreeable to therapy this am.  Affect is flat but he is cooperative.  Bed mobility mod I, anterior/posterior transfer bed to/from w/c with set up/CS.  Patient able to don shrinker for residual limb independently.  Grooming, bathing and dressing completed at w/c level with set up only.  Good carryover of strategies reviewed during sessions yesterday.  Patient self propelled w/c from rehab unit to/from outdoor seating area without difficulty.  Patient returned to bed and missed 15 minutes of session due to fatigue.  He is agreeable to start PM session early to make up time missed this morning.  Bed alarm set and call bell/tray table in reach   Therapy Documentation Precautions:  Precautions Precautions: Fall Precaution Comments: Prior Left BKA; left prosthetic. Recent R BKA.  Restrictions Weight Bearing Restrictions: Yes RLE Weight Bearing: Non weight bearing General: General OT Amount of Missed Time: 15 Minutes Vital Signs: Therapy Vitals Temp: 98.5 F (36.9 C) Temp Source: Oral Pulse Rate: 81 Resp: 16 BP: (!) 143/37 Patient Position (if appropriate): Lying Oxygen Therapy SpO2: 100 % O2 Device: Room Air Pain: Pain Assessment Pain Scale: 0-10 Pain Score: 8  Pain Type: Acute pain Pain Location: Leg Pain Orientation: Right Pain Descriptors / Indicators: Aching Pain Onset: On-going Pain Intervention(s): Repositioned;Environmental changes Other Treatments:     Therapy/Group: Individual Therapy  Carlos Levering 11/25/2018, 9:44 AM

## 2018-11-25 NOTE — Progress Notes (Signed)
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  Receives dialysis Monday Wednesday Friday.  He underwent successful dialysis 11/24/2018 with removal of 3 L  Blood pressure 143/37 pulse 81 temperature 98.5 O2 sats 90% room air  Sodium 129 potassium 4.5 chloride 92 CO2 22 BUN 46 creatinine 9.23 glucose 208 calcium 7.2 phosphorus 4.9 Albumin 1.8 WBC 10.6 hemoglobin 7.2 platelets 821  Zosyn initiated on 11/17/2018 - 11/24/18.   vancomycin 11/17/2018. ----  Aspirin 81 mg daily  , vancomycin per pharmacy, atorvastatin 80 mg daily carvedilol 6.25 mg twice daily, isosorbide 30 mg daily, calcitriol 1.25 mcg Monday Wednesday Friday, insulin sliding scale, insulin glargine 14 units, Fosrenol 2 g 3 times daily, Protonix 40 mg daily.  Plavix 75 mg daily   Blood cultures no growth from 11/04/2018    Objective:  Vital signs in last 24 hours:  Temp:  [97.7 F (36.5 C)-98.5 F (36.9 C)] 98.5 F (36.9 C) (04/11 0639) Pulse Rate:  [79-88] 81 (04/11 0639) Resp:  [16-17] 16 (04/11 0639) BP: (82-186)/(24-74) 143/37 (04/11 0639) SpO2:  [99 %-100 %] 100 % (04/11 0639) Weight:  [67.9 kg-76.2 kg] 67.9 kg (04/10 1940)  Weight change: 5.3 kg Filed Weights   11/23/18 1551 11/24/18 1530 11/24/18 1940  Weight: 70.9 kg 76.2 kg 67.9 kg    Intake/Output: I/O last 3 completed shifts: In: 340 [P.O.:340] Out: 3007 [Other:3007]   Intake/Output this shift:  Total I/O In: 360 [P.O.:360] Out: -    Chronically ill-appearing male no obvious distress CVS- RRR RS- CTA no wheezes rales ABD- BS present soft non-distended EXT- no edema   left AV fistula with bruit   bilateral BKA with trace edema intact stump with staples on the right no redness erythema or oozing   Basic Metabolic Panel: Recent Labs  Lab 11/19/18 0416 11/20/18 0730 11/22/18 0840 11/24/18 1527  NA 127* 127* 126* 129*  K  3.4* 3.7 3.5 4.5  CL 92* 92* 92* 92*  CO2 22 20* 22 22  GLUCOSE 317* 244* 175* 208*  BUN 48* 62* 49* 46*  CREATININE 8.63* 10.55* 9.41* 9.23*  CALCIUM 8.1* 7.7* 6.9* 7.2*  PHOS  --  3.7 3.5 4.9*    Liver Function Tests: Recent Labs  Lab 11/20/18 0730 11/22/18 0840 11/24/18 1527  ALBUMIN 1.7* 1.7* 1.8*   No results for input(s): LIPASE, AMYLASE in the last 168 hours. No results for input(s): AMMONIA in the last 168 hours.  CBC: Recent Labs  Lab 11/19/18 0416 11/20/18 0729 11/22/18 0840 11/24/18 1527  WBC 20.0* 14.9* 10.0 10.6*  HGB 8.4* 7.5* 7.0* 7.2*  HCT 25.9* 24.0* 23.5* 23.1*  MCV 77.5* 80.3 81.3 81.6  PLT 733* 754* 761* 821*    Cardiac Enzymes: No results for input(s): CKTOTAL, CKMB, CKMBINDEX, TROPONINI in the last 168 hours.  BNP: Invalid input(s): POCBNP  CBG: Recent Labs  Lab 11/23/18 2135 11/24/18 0611 11/24/18 1202 11/24/18 2112 11/25/18 0641  GLUCAP 219* 255* 86 178* 164*    Microbiology: Results for orders placed or performed during the hospital encounter of 11/03/18  Blood Culture (routine x 2)     Status: Abnormal   Collection Time: 11/03/18  8:54 AM  Result Value Ref Range Status   Specimen Description BLOOD RIGHT HAND  Final   Special Requests   Final    BOTTLES DRAWN AEROBIC AND ANAEROBIC Blood Culture adequate volume  Culture  Setup Time   Final    GRAM POSITIVE COCCI IN BOTH AEROBIC AND ANAEROBIC BOTTLES CRITICAL RESULT CALLED TO, READ BACK BY AND VERIFIED WITH: C AMEND PHARMD 11/03/18 2349 JDW Performed at [facility], [place] [address], [place], [place] [address]    Culture METHICILLIN RESISTANT STAPHYLOCOCCUS AUREUS (A)  Final   Report Status 11/06/2018 FINAL  Final   Organism ID, Bacteria METHICILLIN RESISTANT STAPHYLOCOCCUS AUREUS  Final      Susceptibility   Methicillin resistant staphylococcus aureus - MIC*    CIPROFLOXACIN >=8 RESISTANT Resistant     ERYTHROMYCIN >=8 RESISTANT Resistant     GENTAMICIN <=0.5  SENSITIVE Sensitive     OXACILLIN >=4 RESISTANT Resistant     TETRACYCLINE <=1 SENSITIVE Sensitive     VANCOMYCIN 1 SENSITIVE Sensitive     TRIMETH/SULFA <=10 SENSITIVE Sensitive     CLINDAMYCIN <=0.25 SENSITIVE Sensitive     RIFAMPIN <=0.5 SENSITIVE Sensitive     Inducible Clindamycin NEGATIVE Sensitive     * METHICILLIN RESISTANT STAPHYLOCOCCUS AUREUS  Blood Culture ID Panel (Reflexed)     Status: Abnormal   Collection Time: 11/03/18  8:54 AM  Result Value Ref Range Status   Enterococcus species NOT DETECTED NOT DETECTED Final   Listeria monocytogenes NOT DETECTED NOT DETECTED Final   Staphylococcus species DETECTED (A) NOT DETECTED Final    Comment: CRITICAL RESULT CALLED TO, READ BACK BY AND VERIFIED WITH: C AMEND PHARMD 11/03/18 2349 JDW    Staphylococcus aureus (BCID) DETECTED (A) NOT DETECTED Final    Comment: Methicillin (oxacillin)-resistant Staphylococcus aureus (MRSA). MRSA is predictably resistant to beta-lactam antibiotics (except ceftaroline). Preferred therapy is vancomycin unless clinically contraindicated. Patient requires contact precautions if  hospitalized. CRITICAL RESULT CALLED TO, READ BACK BY AND VERIFIED WITH: C AMEND PHARMD 11/03/18 2349 JDW    Methicillin resistance DETECTED (A) NOT DETECTED Final    Comment: CRITICAL RESULT CALLED TO, READ BACK BY AND VERIFIED WITH: C AMEND PHARMD 11/03/18 2349 JDW    Streptococcus species NOT DETECTED NOT DETECTED Final   Streptococcus agalactiae NOT DETECTED NOT DETECTED Final   Streptococcus pneumoniae NOT DETECTED NOT DETECTED Final   Streptococcus pyogenes NOT DETECTED NOT DETECTED Final   Acinetobacter baumannii NOT DETECTED NOT DETECTED Final   Enterobacteriaceae species NOT DETECTED NOT DETECTED Final   Enterobacter cloacae complex NOT DETECTED NOT DETECTED Final   Escherichia coli NOT DETECTED NOT DETECTED Final   Klebsiella oxytoca NOT DETECTED NOT DETECTED Final   Klebsiella pneumoniae NOT DETECTED NOT DETECTED  Final   Proteus species NOT DETECTED NOT DETECTED Final   Serratia marcescens NOT DETECTED NOT DETECTED Final   Haemophilus influenzae NOT DETECTED NOT DETECTED Final   Neisseria meningitidis NOT DETECTED NOT DETECTED Final   Pseudomonas aeruginosa NOT DETECTED NOT DETECTED Final   Candida albicans NOT DETECTED NOT DETECTED Final   Candida glabrata NOT DETECTED NOT DETECTED Final   Candida krusei NOT DETECTED NOT DETECTED Final   Candida parapsilosis NOT DETECTED NOT DETECTED Final   Candida tropicalis NOT DETECTED NOT DETECTED Final    Comment: Performed at [facility], [place]. [address] [place], [place] [address]  Blood Culture (routine x 2)     Status: Abnormal   Collection Time: 11/03/18  9:42 PM  Result Value Ref Range Status   Specimen Description BLOOD RIGHT HAND  Final   Special Requests   Final    BOTTLES DRAWN AEROBIC ONLY Blood Culture results may not be optimal due  to an inadequate volume of blood received in culture bottles   Culture  Setup Time   Final    AEROBIC BOTTLE ONLY GRAM POSITIVE COCCI CRITICAL VALUE NOTED.  VALUE IS CONSISTENT WITH PREVIOUSLY REPORTED AND CALLED VALUE. Performed at [facility], [place] [address] [place], [place] [address]    Culture (A)  Final    METHICILLIN RESISTANT STAPHYLOCOCCUS AUREUS CORRECTED ON 03/27 AT 7741: PREVIOUSLY REPORTED AS GRAM POSITIVE COCCI CORRECTED ON 03/26 AT 0124: PREVIOUSLY REPORTED AS NO GROWTH 5 DAYS   Report Status 11/11/2018 FINAL  Final   Organism ID, Bacteria METHICILLIN RESISTANT STAPHYLOCOCCUS AUREUS  Final      Susceptibility   Methicillin resistant staphylococcus aureus - MIC*    CIPROFLOXACIN >=8 RESISTANT Resistant     ERYTHROMYCIN >=8 RESISTANT Resistant     GENTAMICIN <=0.5 SENSITIVE Sensitive     OXACILLIN >=4 RESISTANT Resistant     TETRACYCLINE <=1 SENSITIVE Sensitive     VANCOMYCIN 1 SENSITIVE Sensitive     TRIMETH/SULFA <=10 SENSITIVE Sensitive     CLINDAMYCIN <=0.25 SENSITIVE Sensitive      RIFAMPIN <=0.5 SENSITIVE Sensitive     Inducible Clindamycin NEGATIVE Sensitive     * METHICILLIN RESISTANT STAPHYLOCOCCUS AUREUS CORRECTED ON 03/27 AT 0734: PREVIOUSLY REPORTED AS GRAM POSITIVE COCCI CORRECTED ON 03/26 AT 0124: PREVIOUSLY REPORTED AS NO GROWTH 5 DAYS  Culture, blood (Routine X 2) w Reflex to ID Panel     Status: None   Collection Time: 11/04/18  2:50 AM  Result Value Ref Range Status   Specimen Description BLOOD RIGHT FOREARM  Final   Special Requests   Final    BOTTLES DRAWN AEROBIC AND ANAEROBIC Blood Culture adequate volume   Culture   Final    NO GROWTH 5 DAYS Performed at [facility], [place] [address] [place], [place] [address]    Report Status 11/09/2018 FINAL  Final  Culture, blood (Routine X 2) w Reflex to ID Panel     Status: None   Collection Time: 11/04/18 10:07 AM  Result Value Ref Range Status   Specimen Description BLOOD RIGHT ARM  Final   Special Requests   Final    BOTTLES DRAWN AEROBIC ONLY Blood Culture adequate volume   Culture   Final    NO GROWTH 5 DAYS Performed at [facility], [place] [address] [place], [place] [address]    Report Status 11/09/2018 FINAL  Final    Coagulation Studies: No results for input(s): LABPROT, INR in the last 72 hours.  Urinalysis: No results for input(s): COLORURINE, LABSPEC, PHURINE, GLUCOSEU, HGBUR, BILIRUBINUR, KETONESUR, PROTEINUR, UROBILINOGEN, NITRITE, LEUKOCYTESUR in the last 72 hours.  Invalid input(s): APPERANCEUR    Imaging: No results found.   Medications:   . [START ON 11/27/2018] vancomycin     . aspirin EC  81 mg Oral Daily  . atorvastatin  80 mg Oral q1800  . calcitRIOL  1.25 mcg Oral Q M,W,F-HD  . carvedilol  6.25 mg Oral BID  . Chlorhexidine Gluconate Cloth  6 each Topical Q0600  . clopidogrel  75 mg Oral Daily  . docusate sodium  100 mg Oral Daily  . heparin  5,000 Units Subcutaneous Q8H  . insulin aspart  0-9 Units Subcutaneous TID WC  . insulin aspart  3 Units  Subcutaneous TID WC  . insulin glargine  14 Units Subcutaneous QHS  . isosorbide mononitrate  30 mg Oral Daily  . lanthanum  2,000 mg Oral TID  WC  . multivitamin  1 tablet Oral QHS  . 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  It appears that the Zosyn was discontinued 11/23/2018.  No growth and blood cultures.    End-stage renal disease hemodialysis Monday Wednesday Friday received dialysis 11/24/2018 with removal of 3 L  Anemia decrease hemoglobin darbepoetin 11/22/2018 IV iron load will be held with infection.  Metabolic bone disease continue medications  Congestive heart failure with a history of systolic dysfunction EF 63% last 2D echo January 2020  Coronary artery disease status post CABG x3.   LOS: Sikes @TODAY @9 :23 AM

## 2018-11-25 NOTE — Progress Notes (Signed)
[name] Elahi Sr. is a 43 y.o. male Aug 21, 1975 [id number]  Subjective: No new complaints, except for postop pain. No new problems.   Objective: Vital signs in last 24 hours: Temp:  [98 F (36.7 C)-98.5 F (36.9 C)] 98.5 F (36.9 C) (04/11 0639) Pulse Rate:  [79-88] 81 (04/11 0639) Resp:  [16-17] 16 (04/11 0639) BP: (82-186)/(24-74) 143/37 (04/11 0639) SpO2:  [99 %-100 %] 100 % (04/11 0639) Weight:  [67.9 kg-76.2 kg] 67.9 kg (04/10 1940) Weight change: 5.3 kg Last BM Date: 11/25/18  Intake/Output from previous day: 04/10 0701 - 04/11 0700 In: 340 [P.O.:340] Out: 3007  Last cbgs: CBG (last 3)  Recent Labs    11/24/18 2112 11/25/18 0641 11/25/18 1143  GLUCAP 178* 164* 147*     Physical Exam General: No apparent distress.  He is trying to go back to sleep HEENT: not dry Lungs: Normal effort. Lungs clear to auscultation, no crackles or wheezes. Cardiovascular: Regular rate and rhythm, no edema Abdomen: S/NT/ND; BS(+) Musculoskeletal:  unchanged Neurological: No new neurological deficits Wounds: Dressed Skin: clear   Mental state: Alert, oriented, cooperative    Lab Results: BMET    Component Value Date/Time   NA 129 (L) 11/24/2018 1527   NA 133 (L) 02/07/2018 0937   K 4.5 11/24/2018 1527   CL 92 (L) 11/24/2018 1527   CO2 22 11/24/2018 1527   GLUCOSE 208 (H) 11/24/2018 1527   GLUCOSE 262 03/06/2008   BUN 46 (H) 11/24/2018 1527   BUN 37 (H) 02/07/2018 0937   CREATININE 9.23 (H) 11/24/2018 1527   CALCIUM 7.2 (L) 11/24/2018 1527   GFRNONAA 6 (L) 11/24/2018 1527   GFRAA 7 (L) 11/24/2018 1527   CBC    Component Value Date/Time   WBC 10.6 (H) 11/24/2018 1527   RBC 2.83 (L) 11/24/2018 1527   HGB 7.2 (L) 11/24/2018 1527   HGB 14.3 02/07/2018 0937   HCT 23.1 (L) 11/24/2018 1527   HCT 42.8 02/07/2018 0937   PLT 821 (H) 11/24/2018 1527   PLT 369 02/07/2018 0937   MCV 81.6 11/24/2018 1527   MCV 82 02/07/2018 0937   MCH 25.4 (L) 11/24/2018 1527   MCHC 31.2 11/24/2018 1527   RDW 18.9 (H) 11/24/2018 1527   RDW 21.5 (H) 02/07/2018 0937   LYMPHSABS 1.3 11/03/2018 0848   MONOABS 1.0 11/03/2018 0848   EOSABS 0.1 11/03/2018 0848   BASOSABS 0.0 11/03/2018 0848    Studies/Results: No results found.  Medications: I have reviewed the patient's current medications.  Assessment/Plan:   1.  Status post right BKA on 11/17/2018 after failed TMA.  History of left BKA in July 2018.  CIR 2.  DVT prophylaxis with heparin subcu 3.  Pain management with morphine 15 mg every 4 hours as needed 4.  Emotional support 5.  End-stage renal disease on hemodialysis 6.  Acute on chronic anemia.  Hemoglobin was 7.04/8 7.  On vancomycin for positive blood cultures.  Planning to discontinue vancomycin on 12/16/2018 8.  Type 2 diabetes.  On NovoLog, Lantus 9.  Coronary artery disease.  Status post CABG.  Continue aspirin, Plavix, Imdur, Coreg 10.  Dyslipidemia.  On Lipitor 11.  Constipation.  On bowel program    Length of stay, days: 2  Walker Kehr , MD 11/25/2018, 2:01 PM

## 2018-11-26 ENCOUNTER — Inpatient Hospital Stay (HOSPITAL_COMMUNITY): Payer: Medicaid Other

## 2018-11-26 LAB — GLUCOSE, CAPILLARY
Glucose-Capillary: 228 mg/dL — ABNORMAL HIGH (ref 70–99)
Glucose-Capillary: 131 mg/dL — ABNORMAL HIGH (ref 70–99)
Glucose-Capillary: 128 mg/dL — ABNORMAL HIGH (ref 70–99)
Glucose-Capillary: 107 mg/dL — ABNORMAL HIGH (ref 70–99)

## 2018-11-26 MED ORDER — CHLORHEXIDINE GLUCONATE CLOTH 2 % EX PADS: 6.0000 | MEDICATED_PAD | Freq: Every day | CUTANEOUS | Status: DC

## 2018-11-26 MED ORDER — DARBEPOETIN ALFA 200 MCG/0.4ML IJ SOSY
200.0000 ug | PREFILLED_SYRINGE | INTRAMUSCULAR | Status: DC
  Administered 2018-11-27: 200 ug via INTRAVENOUS
  Filled 2018-11-26: qty 0.4

## 2018-11-26 NOTE — Progress Notes (Signed)
Pharmacy Antibiotic Note  [name]. is a 43 y.o. male with ESRD on HD MWF admitted on 11/23/2018 with necrotic right TMA.  He had a right BKA 4/3. Pharmacy has been consulted for vancomycin dosing. Of note, patient was receiving vancomycin PTA for 6 weeks for MRSA bacteremia - through 12/16/18 per last ID note.    Patient remains afebrile and last WBC was stable from previous at 10.6. He continues with a MWF HD schedule, last was 4/10 for 4 hrs, BFR 400. Patient's weight has been charted as decreasing since admission (80.3 kg>73.6 kg>70.9 kg>76.2 kg>67.9 kg). Will obtain pre-HD level Monday to assess whether or not vancomycin dose needs to be decreased.  Plan: -Continue vancomycin 1g IV QHD-MWF -Pre-HD level Monday 4/13 -Monitor HD schedule, clinical improvement   Height: 6\' 2"  (188 cm) Weight: 149 lb 11.1 oz (67.9 kg) IBW/kg (Calculated) : 82.2  Temp (24hrs), Avg:98.2 F (36.8 C), Min:97.8 F (36.6 C), Max:98.6 F (37 C)  Recent Labs  Lab 11/20/18 0235 11/20/18 0729 11/20/18 0730 11/22/18 0840 11/24/18 1527  WBC  --  14.9*  --  10.0 10.6*  CREATININE  --   --  10.55* 9.41* 9.23*  VANCORANDOM 29  --   --   --   --     Estimated Creatinine Clearance: 10.1 mL/min (A) (by C-G formula based on SCr of 9.23 mg/dL (H)).    Allergies  Allergen Reactions  . Coconut Oil Anaphylaxis and Other (See Comments)    Can use topically, allergic to coconut foods    Dose adjustments this admission: -4/6 Pre-HD Vanc level: 29 - continue 1 g IV qHD MWF   Jackson Latino, PharmD PGY1 Pharmacy Resident Phone [phone] 11/26/2018     9:57 AM

## 2018-11-26 NOTE — Progress Notes (Signed)
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  Receives dialysis Monday Wednesday Friday.  He underwent successful dialysis 11/24/2018 with removal of 3 L  Blood pressure 121/58 pulse 77 temperature 98.1 O2 sats 100% room air  Last labs 11/24/2018 reviewed  Zosyn initiated on 11/17/2018 - 11/24/18.   vancomycin 11/17/2018. ----Plans to discontinue 12/16/2018  Aspirin 81 mg daily  , vancomycin per pharmacy, atorvastatin 80 mg daily carvedilol 6.25 mg twice daily, isosorbide 30 mg daily, calcitriol 1.25 mcg Monday Wednesday Friday, insulin sliding scale, insulin glargine 14 units, Fosrenol 2 g 3 times daily, Protonix 40 mg daily.  Plavix 75 mg daily   Blood cultures no growth from 11/04/2018    Objective:  Vital signs in last 24 hours:  Temp:  [97.8 F (36.6 C)-98.6 F (37 C)] 98.1 F (36.7 C) (04/12 0531) Pulse Rate:  [77-85] 77 (04/12 0531) Resp:  [18] 18 (04/12 0531) BP: (117-121)/(31-58) 121/58 (04/12 0531) SpO2:  [100 %] 100 % (04/12 0531)  Weight change:  Filed Weights   11/23/18 1551 11/24/18 1530 11/24/18 1940  Weight: 70.9 kg 76.2 kg 67.9 kg    Intake/Output: I/O last 3 completed shifts: In: 600 [P.O.:600] Out: 3007 [Other:3007]   Intake/Output this shift:  Total I/O In: 180 [P.O.:180] Out: -    Chronically ill-appearing male no obvious distress CVS- RRR RS- CTA no wheezes rales ABD- BS present soft non-distended EXT- no edema   left AV fistula with bruit   bilateral BKA with trace edema intact stump with staples on the right no redness erythema or oozing   Basic Metabolic Panel: Recent Labs  Lab 11/20/18 0730 11/22/18 0840 11/24/18 1527  NA 127* 126* 129*  K 3.7 3.5 4.5  CL 92* 92* 92*  CO2 20* 22 22  GLUCOSE 244* 175* 208*  BUN 62* 49* 46*  CREATININE 10.55* 9.41* 9.23*  CALCIUM 7.7* 6.9* 7.2*  PHOS 3.7 3.5 4.9*    Liver  Function Tests: Recent Labs  Lab 11/20/18 0730 11/22/18 0840 11/24/18 1527  ALBUMIN 1.7* 1.7* 1.8*   No results for input(s): LIPASE, AMYLASE in the last 168 hours. No results for input(s): AMMONIA in the last 168 hours.  CBC: Recent Labs  Lab 11/20/18 0729 11/22/18 0840 11/24/18 1527  WBC 14.9* 10.0 10.6*  HGB 7.5* 7.0* 7.2*  HCT 24.0* 23.5* 23.1*  MCV 80.3 81.3 81.6  PLT 754* 761* 821*    Cardiac Enzymes: No results for input(s): CKTOTAL, CKMB, CKMBINDEX, TROPONINI in the last 168 hours.  BNP: Invalid input(s): POCBNP  CBG: Recent Labs  Lab 11/25/18 0641 11/25/18 1143 11/25/18 1713 11/25/18 2107 11/26/18 0635  GLUCAP 164* 147* 134* 171* 47*    Microbiology: Results for orders placed or performed during the hospital encounter of 11/03/18  Blood Culture (routine x 2)     Status: Abnormal   Collection Time: 11/03/18  8:54 AM  Result Value Ref Range Status   Specimen Description BLOOD RIGHT HAND  Final   Special Requests   Final    BOTTLES DRAWN AEROBIC AND ANAEROBIC Blood Culture adequate volume   Culture  Setup Time   Final    GRAM POSITIVE COCCI IN BOTH AEROBIC AND ANAEROBIC BOTTLES CRITICAL RESULT CALLED TO, READ BACK BY AND VERIFIED WITH: C AMEND PHARMD 11/03/18 2349 JDW Performed at [facility], [place] [address], [place], [address] [address]  Culture METHICILLIN RESISTANT STAPHYLOCOCCUS AUREUS (A)  Final   Report Status 11/06/2018 FINAL  Final   Organism ID, Bacteria METHICILLIN RESISTANT STAPHYLOCOCCUS AUREUS  Final      Susceptibility   Methicillin resistant staphylococcus aureus - MIC*    CIPROFLOXACIN >=8 RESISTANT Resistant     ERYTHROMYCIN >=8 RESISTANT Resistant     GENTAMICIN <=0.5 SENSITIVE Sensitive     OXACILLIN >=4 RESISTANT Resistant     TETRACYCLINE <=1 SENSITIVE Sensitive     VANCOMYCIN 1 SENSITIVE Sensitive     TRIMETH/SULFA <=10 SENSITIVE Sensitive     CLINDAMYCIN <=0.25 SENSITIVE Sensitive     RIFAMPIN <=0.5 SENSITIVE  Sensitive     Inducible Clindamycin NEGATIVE Sensitive     * METHICILLIN RESISTANT STAPHYLOCOCCUS AUREUS  Blood Culture ID Panel (Reflexed)     Status: Abnormal   Collection Time: 11/03/18  8:54 AM  Result Value Ref Range Status   Enterococcus species NOT DETECTED NOT DETECTED Final   Listeria monocytogenes NOT DETECTED NOT DETECTED Final   Staphylococcus species DETECTED (A) NOT DETECTED Final    Comment: CRITICAL RESULT CALLED TO, READ BACK BY AND VERIFIED WITH: C AMEND PHARMD 11/03/18 2349 JDW    Staphylococcus aureus (BCID) DETECTED (A) NOT DETECTED Final    Comment: Methicillin (oxacillin)-resistant Staphylococcus aureus (MRSA). MRSA is predictably resistant to beta-lactam antibiotics (except ceftaroline). Preferred therapy is vancomycin unless clinically contraindicated. Patient requires contact precautions if  hospitalized. CRITICAL RESULT CALLED TO, READ BACK BY AND VERIFIED WITH: C AMEND PHARMD 11/03/18 2349 JDW    Methicillin resistance DETECTED (A) NOT DETECTED Final    Comment: CRITICAL RESULT CALLED TO, READ BACK BY AND VERIFIED WITH: C AMEND PHARMD 11/03/18 2349 JDW    Streptococcus species NOT DETECTED NOT DETECTED Final   Streptococcus agalactiae NOT DETECTED NOT DETECTED Final   Streptococcus pneumoniae NOT DETECTED NOT DETECTED Final   Streptococcus pyogenes NOT DETECTED NOT DETECTED Final   Acinetobacter baumannii NOT DETECTED NOT DETECTED Final   Enterobacteriaceae species NOT DETECTED NOT DETECTED Final   Enterobacter cloacae complex NOT DETECTED NOT DETECTED Final   Escherichia coli NOT DETECTED NOT DETECTED Final   Klebsiella oxytoca NOT DETECTED NOT DETECTED Final   Klebsiella pneumoniae NOT DETECTED NOT DETECTED Final   Proteus species NOT DETECTED NOT DETECTED Final   Serratia marcescens NOT DETECTED NOT DETECTED Final   Haemophilus influenzae NOT DETECTED NOT DETECTED Final   Neisseria meningitidis NOT DETECTED NOT DETECTED Final   Pseudomonas aeruginosa  NOT DETECTED NOT DETECTED Final   Candida albicans NOT DETECTED NOT DETECTED Final   Candida glabrata NOT DETECTED NOT DETECTED Final   Candida krusei NOT DETECTED NOT DETECTED Final   Candida parapsilosis NOT DETECTED NOT DETECTED Final   Candida tropicalis NOT DETECTED NOT DETECTED Final    Comment: Performed at [facility], [place]. [address] [place], [place] [address]  Blood Culture (routine x 2)     Status: Abnormal   Collection Time: 11/03/18  9:42 PM  Result Value Ref Range Status   Specimen Description BLOOD RIGHT HAND  Final   Special Requests   Final    BOTTLES DRAWN AEROBIC ONLY Blood Culture results may not be optimal due to an inadequate volume of blood received in culture bottles   Culture  Setup Time   Final    AEROBIC BOTTLE ONLY GRAM POSITIVE COCCI CRITICAL VALUE NOTED.  VALUE IS CONSISTENT WITH PREVIOUSLY REPORTED AND CALLED VALUE. Performed at [facility], [place] [address]  [place], [place] [address]    Culture (A)  Final    METHICILLIN RESISTANT STAPHYLOCOCCUS AUREUS CORRECTED ON 03/27 AT 0734: PREVIOUSLY REPORTED AS GRAM POSITIVE COCCI CORRECTED ON 03/26 AT 0124: PREVIOUSLY REPORTED AS NO GROWTH 5 DAYS   Report Status 11/11/2018 FINAL  Final   Organism ID, Bacteria METHICILLIN RESISTANT STAPHYLOCOCCUS AUREUS  Final      Susceptibility   Methicillin resistant staphylococcus aureus - MIC*    CIPROFLOXACIN >=8 RESISTANT Resistant     ERYTHROMYCIN >=8 RESISTANT Resistant     GENTAMICIN <=0.5 SENSITIVE Sensitive     OXACILLIN >=4 RESISTANT Resistant     TETRACYCLINE <=1 SENSITIVE Sensitive     VANCOMYCIN 1 SENSITIVE Sensitive     TRIMETH/SULFA <=10 SENSITIVE Sensitive     CLINDAMYCIN <=0.25 SENSITIVE Sensitive     RIFAMPIN <=0.5 SENSITIVE Sensitive     Inducible Clindamycin NEGATIVE Sensitive     * METHICILLIN RESISTANT STAPHYLOCOCCUS AUREUS CORRECTED ON 03/27 AT 0734: PREVIOUSLY REPORTED AS GRAM POSITIVE COCCI CORRECTED ON 03/26 AT 0124: PREVIOUSLY  REPORTED AS NO GROWTH 5 DAYS  Culture, blood (Routine X 2) w Reflex to ID Panel     Status: None   Collection Time: 11/04/18  2:50 AM  Result Value Ref Range Status   Specimen Description BLOOD RIGHT FOREARM  Final   Special Requests   Final    BOTTLES DRAWN AEROBIC AND ANAEROBIC Blood Culture adequate volume   Culture   Final    NO GROWTH 5 DAYS Performed at [facility], [place] [address], [place], [place] [address]    Report Status 11/09/2018 FINAL  Final  Culture, blood (Routine X 2) w Reflex to ID Panel     Status: None   Collection Time: 11/04/18 10:07 AM  Result Value Ref Range Status   Specimen Description BLOOD RIGHT ARM  Final   Special Requests   Final    BOTTLES DRAWN AEROBIC ONLY Blood Culture adequate volume   Culture   Final    NO GROWTH 5 DAYS Performed at [facility], [place] [address], [place],  [address]    Report Status 11/09/2018 FINAL  Final    Coagulation Studies: No results for input(s): LABPROT, INR in the last 72 hours.  Urinalysis: No results for input(s): COLORURINE, LABSPEC, PHURINE, GLUCOSEU, HGBUR, BILIRUBINUR, KETONESUR, PROTEINUR, UROBILINOGEN, NITRITE, LEUKOCYTESUR in the last 72 hours.  Invalid input(s): APPERANCEUR    Imaging: No results found.   Medications:   . [START ON 11/27/2018] vancomycin     . aspirin EC  81 mg Oral Daily  . atorvastatin  80 mg Oral q1800  . calcitRIOL  1.25 mcg Oral Q M,W,F-HD  . carvedilol  6.25 mg Oral BID  . Chlorhexidine Gluconate Cloth  6 each Topical Q0600  . clopidogrel  75 mg Oral Daily  . docusate sodium  100 mg Oral Daily  . heparin  5,000 Units Subcutaneous Q8H  . insulin aspart  0-9 Units Subcutaneous TID WC  . insulin aspart  3 Units Subcutaneous TID WC  . insulin glargine  14 Units Subcutaneous QHS  . isosorbide mononitrate  30 mg Oral Daily  . lanthanum  2,000 mg Oral TID WC  . multivitamin  1 tablet Oral QHS  . 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   IV iron load will be held with infection.  Metabolic bone disease continue medications  Congestive heart failure with a history of systolic dysfunction EF 29% last 2D echo January 2020  Coronary artery disease status post CABG x3.   LOS: [place] @TODAY @10 :13 AM

## 2018-11-26 NOTE — Progress Notes (Signed)
Occupational Therapy Session Note  Patient Details  Name: [name]. MRN: [id number] Date of Birth: 1976/01/09  Today's Date: 11/26/2018 OT Individual Time: 1000-1100 OT Individual Time Calculation (min): 60 min    Short Term Goals: Week 1:  OT Short Term Goal 1 (Week 1): NA due to short LOS anticipated  Skilled Therapeutic Interventions/Progress Updates:    Pt received supine  With c/o nausea/stomach hurting from recent BM. Pt donned shirt and pants bed level with mod I. Pt completed T transfer to w/c with (S), assist on w/c only. Pt able to manage all w/c features independently. Pt able to propel w/c 100 ft with mod I. Pt transferred to therapy mat with (S). Pt transitioned to prone and was guided through progressive relaxation of R LE to promote optimal positioning/extension of residual limb. Pt c/o pain in distal end of residual limb with any pressure applied to posterior limb. Pt resistant to edu, stating "I will straighten it when I get my prosthesis". Pt completed BLE extension and abduction exercises to increase strength/flexibility. Pt then completed modified push ups and back extension exercises to increase posterior chain strength for improved posture/control. Pt then transitioned to sitting EOM with mod I. Pt engaged in dynamic trunk control activity with no LOB, good awareness of safety overall. Pt held 3lb dowel to increase BUE strengthening as well. Pt c/o fatigue throughout session and with flat affect. Pt returned to room and was left supine with all needs met, bed alarm set.   Therapy Documentation Precautions:  Precautions Precautions: Fall Precaution Comments: Prior Left BKA; left prosthetic. Recent R BKA.  Restrictions Weight Bearing Restrictions: Yes RLE Weight Bearing: Non weight bearing   Therapy/Group: Individual Therapy  Curtis Sites 11/26/2018, 7:19 AM

## 2018-11-26 NOTE — Progress Notes (Signed)
[name] Aultman Sr. is a 43 y.o. male 27-Mar-1976 [id number]  Subjective: No new complaints. No new problems. Slept well. Feeling OK.  Objective: Vital signs in last 24 hours: Temp:  [97.8 F (36.6 C)-98.6 F (37 C)] 98.1 F (36.7 C) (04/12 0531) Pulse Rate:  [77-85] 77 (04/12 0531) Resp:  [18] 18 (04/12 0531) BP: (117-121)/(31-58) 121/58 (04/12 0531) SpO2:  [100 %] 100 % (04/12 0531) Weight change:  Last BM Date: 11/26/18  Intake/Output from previous day: 04/11 0701 - 04/12 0700 In: 600 [P.O.:600] Out: -  Last cbgs: CBG (last 3)  Recent Labs    11/25/18 2107 11/26/18 0635 11/26/18 1226  GLUCAP 171* 228* 131*     Physical Exam General: No apparent distress   HEENT: not dry Lungs: Normal effort. Lungs clear to auscultation, no crackles or wheezes. Cardiovascular: Regular rate and rhythm, no edema Abdomen: S/NT/ND; BS(+) Musculoskeletal:  unchanged Neurological: No new neurological deficits Wounds: Dressed Skin: clear   Mental state: Alert, oriented, cooperative    Lab Results: BMET    Component Value Date/Time   NA 129 (L) 11/24/2018 1527   NA 133 (L) 02/07/2018 0937   K 4.5 11/24/2018 1527   CL 92 (L) 11/24/2018 1527   CO2 22 11/24/2018 1527   GLUCOSE 208 (H) 11/24/2018 1527   GLUCOSE 262 03/06/2008   BUN 46 (H) 11/24/2018 1527   BUN 37 (H) 02/07/2018 0937   CREATININE 9.23 (H) 11/24/2018 1527   CALCIUM 7.2 (L) 11/24/2018 1527   GFRNONAA 6 (L) 11/24/2018 1527   GFRAA 7 (L) 11/24/2018 1527   CBC    Component Value Date/Time   WBC 10.6 (H) 11/24/2018 1527   RBC 2.83 (L) 11/24/2018 1527   HGB 7.2 (L) 11/24/2018 1527   HGB 14.3 02/07/2018 0937   HCT 23.1 (L) 11/24/2018 1527   HCT 42.8 02/07/2018 0937   PLT 821 (H) 11/24/2018 1527   PLT 369 02/07/2018 0937   MCV 81.6 11/24/2018 1527   MCV 82 02/07/2018 0937   MCH 25.4 (L) 11/24/2018 1527   MCHC 31.2 11/24/2018 1527   RDW 18.9 (H) 11/24/2018 1527   RDW 21.5 (H) 02/07/2018 0937   LYMPHSABS 1.3 11/03/2018 0848   MONOABS 1.0 11/03/2018 0848   EOSABS 0.1 11/03/2018 0848   BASOSABS 0.0 11/03/2018 0848    Studies/Results: No results found.  Medications: I have reviewed the patient's current medications.  Assessment/Plan:    1.  Status post right BKA on 11/17/2018 after failed TMA.  History of left BKA in July 2018.  CIR 2.  DVT prophylaxis with heparin subcutaneously 3.  Pain management with morphine 15 mg every 4 hours as needed 4.  Emotional support 5.  End-stage renal disease on hemodialysis 6.  Acute on chronic anemia.  Hemoglobin was 7.0.  Monitor CBC 7.  On vancomycin for positive blood cultures.  Plan to discontinue vancomycin on 12/16/2018 8.  Type 2 diabetes.  On NovoLog, Lantus 9.  Coronary artery disease.  Status post bypass surgery.  Continue aspirin, Plavix, Imdur, Coreg 10.  Dyslipidemia.  On Lipitor 11.  Constipation.  On bowel program    Length of stay, days: 3  Walker Kehr , MD 11/26/2018, 2:29 PM

## 2018-11-26 NOTE — Plan of Care (Signed)
  Problem: Consults Goal: RH LIMB LOSS PATIENT EDUCATION Description Description: See Patient Education module for eduction specifics. Outcome: Progressing Goal: Skin Care Protocol Initiated - if Braden Score 18 or less Description If consults are not indicated, leave blank or document N/A Outcome: Progressing Goal: Nutrition Consult-if indicated Outcome: Progressing Goal: Diabetes Guidelines if Diabetic/Glucose > 140 Description If diabetic or lab glucose is > 140 mg/dl - Initiate Diabetes/Hyperglycemia Guidelines & Document Interventions  Outcome: Progressing Goal: RH LIMB LOSS PATIENT EDUCATION Description Description: See Patient Education module for eduction specifics. Outcome: Progressing   Problem: RH BOWEL ELIMINATION Goal: RH STG MANAGE BOWEL WITH ASSISTANCE Description STG Manage Bowel with Min Assistance.  Outcome: Progressing Goal: RH STG MANAGE BOWEL W/MEDICATION W/ASSISTANCE Description STG Manage Bowel with Medication with [place].  Outcome: Progressing   Problem: RH BLADDER ELIMINATION Goal: RH STG MANAGE BLADDER WITH ASSISTANCE Description STG Manage Bladder With Min Assistance  Outcome: Progressing   Problem: RH SKIN INTEGRITY Goal: RH STG SKIN FREE OF INFECTION/BREAKDOWN Description Monitor skin for skin breakdown. Patient skin is intact during shift.  Outcome: Progressing Goal: RH STG MAINTAIN SKIN INTEGRITY WITH ASSISTANCE Description STG Maintain Skin Integrity With [place].  Outcome: Progressing Goal: RH STG ABLE TO PERFORM INCISION/WOUND CARE W/ASSISTANCE Description STG Able To Perform Incision/Wound Care With Assistance. Outcome: Progressing   Problem: RH SAFETY Goal: RH STG ADHERE TO SAFETY PRECAUTIONS W/ASSISTANCE/DEVICE Description STG Adhere to Safety Precautions With Min Assistance/Device.  Outcome: Progressing   Problem: RH PAIN MANAGEMENT Goal: RH STG PAIN MANAGED AT OR BELOW PT'S PAIN GOAL Description Patient  will be pain free or pain less than 3 during admission  Outcome: Progressing   Problem: RH KNOWLEDGE DEFICIT LIMB LOSS Goal: RH STG INCREASE KNOWLEDGE OF SELF CARE AFTER LIMB LOSS Outcome: Progressing

## 2018-11-26 NOTE — Progress Notes (Signed)
Physical Therapy Session Note  Patient Details  Name: [name] Bamber Sr. MRN: [id number] Date of Birth: 03-27-1976  Today's Date: 11/26/2018 PT Individual Time: 0803-0858 PT Individual Time Calculation (min): 55 min   Short Term Goals: Week 1:  PT Short Term Goal 1 (Week 1): = LTG  Skilled Therapeutic Interventions/Progress Updates:    Session 1: Pt supine in bed upon PT arrival, reports "It's Sunday, I thought I had the day off." Therapist encouraged pt to participate in therapy, pt agreeable. Pt reports R LE pain 8/10 this session. Pt transferred to sitting with supervision and donned pants with supervision. Pt performed A-P transfer to the w/c with supervision/set up assist. Pt propelled w/c to the gym with supervision x 100 ft using B UEs. Pt performed A-P transfer w/c<>mat this session with supervision. Pt transferred to supine and performed 2 x 10 straight leg raises, transferred to sidelying and performed 2 x 10 hip abduction, both for LE strengthening. Pt transferred to prone on elbows with supervision, maintained this position for hip stretching, education on performing this at home. In prone pt performed x 10 L LE knee ROM flexion<>extension and performed x 10 hip extension for strengthening. Pt transferred to sitting and attempted long sitting hamstring stretch however to painful for R LE. Pt transferred to w/c with supervision and propelled to gym. Pt used UE arm bike x 5 minutes this session on resistance level 5 for UE strengthening and endurance. Pt propelled back to room and transferred to bed, left with needs in reach and bed alarm set.    Therapy Documentation Precautions:  Precautions Precautions: Fall Precaution Comments: Prior Left BKA; left prosthetic. Recent R BKA.  Restrictions Weight Bearing Restrictions: Yes RLE Weight Bearing: Non weight bearing    Therapy/Group: Individual Therapy  Netta Corrigan, PT, DPT 11/26/2018, 8:24 AM

## 2018-11-27 ENCOUNTER — Inpatient Hospital Stay (HOSPITAL_COMMUNITY): Payer: Medicaid Other | Admitting: Occupational Therapy

## 2018-11-27 ENCOUNTER — Inpatient Hospital Stay (HOSPITAL_COMMUNITY): Payer: Medicaid Other

## 2018-11-27 ENCOUNTER — Inpatient Hospital Stay (HOSPITAL_COMMUNITY): Payer: Medicaid Other | Admitting: Physical Therapy

## 2018-11-27 ENCOUNTER — Telehealth: Payer: Self-pay | Admitting: Vascular Surgery

## 2018-11-27 DIAGNOSIS — D649 Anemia, unspecified: Secondary | ICD-10-CM

## 2018-11-27 LAB — RENAL FUNCTION PANEL
Albumin: 2 g/dL — ABNORMAL LOW (ref 3.5–5.0)
Anion gap: 16 — ABNORMAL HIGH (ref 5–15)
BUN: 46 mg/dL — ABNORMAL HIGH (ref 6–20)
CO2: 21 mmol/L — ABNORMAL LOW (ref 22–32)
Calcium: 7.4 mg/dL — ABNORMAL LOW (ref 8.9–10.3)
Chloride: 91 mmol/L — ABNORMAL LOW (ref 98–111)
Creatinine, Ser: 10.48 mg/dL — ABNORMAL HIGH (ref 0.61–1.24)
GFR calc Af Amer: 6 mL/min — ABNORMAL LOW (ref >60)
GFR calc non Af Amer: 5 mL/min — ABNORMAL LOW (ref >60)
Glucose, Bld: 99 mg/dL (ref 70–99)
Phosphorus: 6.6 mg/dL — ABNORMAL HIGH (ref 2.5–4.6)
Potassium: 4.4 mmol/L (ref 3.5–5.1)
Sodium: 128 mmol/L — ABNORMAL LOW (ref 135–145)

## 2018-11-27 LAB — CBC
HCT: 22 % — ABNORMAL LOW (ref 39.0–52.0)
Hemoglobin: 6.6 g/dL — CL (ref 13.0–17.0)
MCH: 24.3 pg — ABNORMAL LOW (ref 26.0–34.0)
MCHC: 30 g/dL (ref 30.0–36.0)
MCV: 80.9 fL (ref 80.0–100.0)
Platelets: 715 10*3/uL — ABNORMAL HIGH (ref 150–400)
RBC: 2.72 MIL/uL — ABNORMAL LOW (ref 4.22–5.81)
RDW: 19.1 % — ABNORMAL HIGH (ref 11.5–15.5)
WBC: 10.3 10*3/uL (ref 4.0–10.5)
nRBC: 0 % (ref 0.0–0.2)

## 2018-11-27 LAB — GLUCOSE, CAPILLARY
Glucose-Capillary: 119 mg/dL — ABNORMAL HIGH (ref 70–99)
Glucose-Capillary: 138 mg/dL — ABNORMAL HIGH (ref 70–99)
Glucose-Capillary: 147 mg/dL — ABNORMAL HIGH (ref 70–99)

## 2018-11-27 LAB — VANCOMYCIN, RANDOM: Vancomycin Rm: 26

## 2018-11-27 LAB — PREPARE RBC (CROSSMATCH)

## 2018-11-27 MED ORDER — DARBEPOETIN ALFA 200 MCG/0.4ML IJ SOSY
PREFILLED_SYRINGE | INTRAMUSCULAR | Status: AC
  Filled 2018-11-27: qty 0.4

## 2018-11-27 MED ORDER — CALCITRIOL 0.25 MCG PO CAPS
ORAL_CAPSULE | ORAL | Status: AC
  Filled 2018-11-27: qty 1

## 2018-11-27 MED ORDER — VANCOMYCIN HCL IN DEXTROSE 750-5 MG/150ML-% IV SOLN
750.0000 mg | INTRAVENOUS | Status: DC
  Administered 2018-11-27: 750 mg via INTRAVENOUS
  Filled 2018-11-27: qty 150

## 2018-11-27 MED ORDER — CALCITRIOL 0.5 MCG PO CAPS
ORAL_CAPSULE | ORAL | Status: AC
  Filled 2018-11-27: qty 2

## 2018-11-27 MED ORDER — SODIUM CHLORIDE 0.9% IV SOLUTION: Freq: Once | INTRAVENOUS | Status: DC

## 2018-11-27 NOTE — Telephone Encounter (Signed)
sch appt spk to pt mld ltr  12/12/2018 930am staple removal MD

## 2018-11-27 NOTE — Plan of Care (Signed)
Due to the current state of emergency, patients may not be receiving their 3-hours of Medicare-mandated therapy.   

## 2018-11-27 NOTE — Progress Notes (Signed)
Social Work  Discharge Note  The overall goal for the admission was met for:   Discharge location: [place]. HIS GIRLFRIEND IS CURRENTLY HOSPITALIZED HERSELF  Length of Stay: Yes-5 DAYS  Discharge activity level: Yes-MOD/I WHEELCHAIR LEVEL  Home/community participation: Yes  Services provided included: MD, RD, PT, OT, RN, CM, Pharmacy and SW  Financial Services: Medicaid  Follow-up services arranged: Home Health: [facility]-PT,OT,RN and Patient/Family request agency HH: [facility], DME: [facility]  Comments (or additional information):PT DID WELL AND DID NOT NEED TO STAY LONG. REACHED GOALS OF MOD/I WHEELCHAIR LEVEL.  Patient/Family verbalized understanding of follow-up arrangements: Yes  Individual responsible for coordination of the follow-up plan: SELF & [facility]  Confirmed correct DME delivered: Elease Hashimoto 11/27/2018    Elease Hashimoto

## 2018-11-27 NOTE — Patient Care Conference (Signed)
Inpatient RehabilitationTeam Conference and Plan of Care Update Date: 11/28/2018   Time: 8:49 AM    Patient Name: [name] Isip Sr.      Medical Record Number: [id number]  Date of Birth: 10/12/1975 Sex: Male         Room/Bed: 4M13C/4M13C-01 Payor Info: Payor: MEDICAID [place] / Plan: MEDICAID OF [place] / Product Type: *No Product type* /    Admitting Diagnosis: r bka  Admit Date/Time:  11/23/2018  2:07 PM Admission Comments: No comment available   Primary Diagnosis:  <principal problem not specified> Principal Problem: <principal problem not specified>  Patient Active Problem List   Diagnosis Date Noted  . Acute on chronic anemia   . Unilateral complete BKA, right, initial encounter ([place])   . Diabetes mellitus type 2 in nonobese (H[facility])   . Bacteremia   . Postoperative pain   . S/P bilateral below knee amputation ([place]) 11/23/2018  . Non-healing wound of lower extremity 11/17/2018  . MRSA bacteremia 11/04/2018  . Infection of amputation stump, right lower extremity ([place]) 11/04/2018  . Non-healing surgical wound 09/14/2018  . Chest pain, rule out acute myocardial infarction 09/07/2018  . Gangrene of toe of left foot ([place]) 08/05/2018  . 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) 08/05/2018  . Gangrene of foot ([place]) 08/05/2018  . 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 ACS (acute coronary syndrome) ([place]) 10/28/2017  . Flash pulmonary edema ([place]) 06/04/2017  . Hypertensive emergency 06/04/2017  . 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 S/P BKA (below knee amputation) unilateral, left ([place])   . Post-operative pain   . Acute blood loss anemia   . Chronic combined systolic and diastolic CHF (congestive heart failure) ([place])   . PVD (peripheral vascular disease) ([place])   . Coronary artery disease involving native coronary artery of native heart with unstable  angina pectoris ([place])   . Tobacco abuse   . Diabetic foot infection ([place]) 03/08/2017  . Osteomyelitis ([place]) 03/08/2017  . Wound dehiscence   . Dehiscence of amputation stump ([place]) 01/04/2017  . Gangrene of right foot ([place]) 08/02/2016  . Achilles tendon contracture, left 08/02/2016  . Anemia of renal disease 08/16/2015  . Wound infection 08/15/2015  . Diabetic ulcer of right great toe ([place]) 08/15/2015  . Pain in the chest   . Elevated troponin   . Essential hypertension   . ESRD on dialysis ([place]) 08/07/2013  . NSTEMI (non-ST elevated myocardial infarction) ([place]) 05/16/2012  . Murmur 05/16/2012  . Renal disorder     Expected Discharge Date: Expected Discharge Date: 11/28/18  Team Members Present: Physician leading conference: Dr. Delice Lesch Social Worker Present: Ovidio Kin, LCSW Nurse Present: Dorien Chihuahua, RN PT Present: Georjean Mode, PT OT Present: Clyda Greener, OT PPS Coordinator present : Gunnar Fusi     Current Status/Progress Goal Weekly Team Focus  Medical   Decreased functional mobility secondary to right BKA 11/17/2018 after failed TMA as well as history of left BKA July 2018  Improved pain, mobility, transfers, bacteremia  See above   Bowel/Bladder        cont Bowel-HD     Swallow/Nutrition/ Hydration             ADL's   mod I for dressing and sponge bathing, patient able to set up w/c for mod I transfers and demonstrates good safety with basic HM at  a w/c level  goals met      Mobility        mod/i wheelchair level     Communication             Safety/Cognition/ Behavioral Observations            Pain        pain managed by MD     Skin        monitor skin and amputation        *See Care Plan and progress notes for long and short-term goals.     Barriers to Discharge  Current Status/Progress Possible Resolutions Date Resolved   Physician    Medical stability;IV antibiotics;Hemodialysis     See above  Therapies, optimize pain meds,  continue IV antibiotics per ID, making good progress-plan for DC tomorrow      Nursing                  PT  Decreased caregiver support                 OT                  SLP                SW Decreased caregiver support Caregiver is hosptialized at this time-Brittany            Discharge Planning/Teaching Needs:    Home with family checking in on him. Girlfriend is currently hospitalized.      Team Discussion:  Goals mod/i wheelchair, reaching goals and will be ready for discharge tomorrow. Medically stable and ready for DC tomorrow.   Revisions to Treatment Plan:  DC 4/14    Continued Need for Acute Rehabilitation Level of Care: The patient requires daily medical management by a physician with specialized training in physical medicine and rehabilitation for the following conditions: Daily direction of a multidisciplinary physical rehabilitation program to ensure safe treatment while eliciting the highest outcome that is of practical value to the patient.: Yes Daily medical management of patient stability for increased activity during participation in an intensive rehabilitation regime.: Yes Daily analysis of laboratory values and/or radiology reports with any subsequent need for medication adjustment of medical intervention for : Post surgical problems;Other   I attest that I was present, lead the team conference, and concur with the assessment and plan of the team.   Elease Hashimoto 11/28/2018, 8:49 AM

## 2018-11-27 NOTE — Telephone Encounter (Signed)
-----   Message from [name], MD sent at 11/23/2018  7:27 PM EDT ----- Can he follow-up in 2 weeks in APP or PA clinic for staple removal from R BKA?  Thanks,  Gerald Stabs

## 2018-11-27 NOTE — Progress Notes (Signed)
Pharmacy Antibiotic Note  [name]. is a 43 y.o. male with ESRD on HD MWF admitted on 11/23/2018 with necrotic right TMA.  He had a right BKA 4/3. Pharmacy has been consulted for vancomycin dosing. Of note, patient was receiving vancomycin PTA for 6 weeks for MRSA bacteremia - through 12/16/18 per last ID note.    PreHD vanc level is slightly high at 26 but acceptable.  Plan: Change vancomycin to 750mg  IV QHD Monitor clinical picture, renal function, preHD vanc levels F/U C&S, abx deescalation / LOT Plan vanc until 5/2 per ID  Height: 6\' 2"  (188 cm) Weight: 149 lb 11.1 oz (67.9 kg) IBW/kg (Calculated) : 82.2  Temp (24hrs), Avg:98.2 F (36.8 C), Min:98.1 F (36.7 C), Max:98.4 F (36.9 C)  Recent Labs  Lab 11/22/18 0840 11/24/18 1527 11/27/18 1204  WBC 10.0 10.6*  --   CREATININE 9.41* 9.23*  --   VANCORANDOM  --   --  26    Estimated Creatinine Clearance: 10.1 mL/min (A) (by C-G formula based on SCr of 9.23 mg/dL (H)).    Allergies  Allergen Reactions  . Coconut Oil Anaphylaxis and Other (See Comments)    Can use topically, allergic to coconut foods    Dose adjustments this admission: -4/6 Pre-HD Vanc level: 29 - continue 1 g IV qHD MWF  Elenor Quinones, PharmD, BCPS, BCIDP Clinical Pharmacist 11/27/2018 12:58 PM

## 2018-11-27 NOTE — Progress Notes (Signed)
Social Work Patient ID: [name] Labella Sr., male   DOB: 08-Aug-1976, 43 y.o.   MRN: [id number] Team feels will be ready of discharge tomorrow, MD agreeable. Plan for DC tomorrow.

## 2018-11-27 NOTE — Progress Notes (Signed)
Physical Therapy Session Note  Patient Details  Name: [name] Radler Sr. MRN: [id number] Date of Birth: 1976-05-19  Today's Date: 11/27/2018 PT Individual Time: 1015-1100 PT Individual Time Calculation (min): 45 min   Short Term Goals: Week 1:  PT Short Term Goal 1 (Week 1): = LTG  Skilled Therapeutic Interventions/Progress Updates: Pt presented in bed agreeable to therapy. Pt stating nausea/stomach pains this am however believes from last night's dinner. Discussed plans to d/c tomorrow and pt feels comfortable at current level as close to functional level prior to surgery. Pt performed bed mobility with bed flat and without rails as similar to home with pt able to perform all bed mobility mod I to independent. Performed A-P transfer to w/c mod I with pt able to perform w/c parts management with set up. Pt then participated in [place], SLR, modified bridge on bolster, sidelying hip extension, and sidelying hip abd for strengthening. Pt requesting ending session due to increased nausea and pain. Pt left in bed with call bell with reach and current needs met.      Therapy Documentation Precautions:  Precautions Precautions: Fall Precaution Comments: Prior Left BKA; left prosthetic. Recent R BKA.  Restrictions Weight Bearing Restrictions: Yes RLE Weight Bearing: Non weight bearing General: PT Amount of Missed Time (min): 30 Minutes PT Missed Treatment Reason: Patient fatigue;Pain Vital Signs:   Therapy/Group: Individual Therapy  Darbie Biancardi  Matea Stanard, PTA  11/27/2018, 1:27 PM

## 2018-11-27 NOTE — Progress Notes (Signed)
Physical Therapy Discharge Summary  Patient Details  Name: [name] Basquez Sr. MRN: [id number] Date of Birth: 1975-12-09  Today's Date: 11/27/2018 PT Individual Time: 1015-1100 PT Individual Time Calculation (min): 45 min    Patient has met 6 of 6 long term goals due to improved activity tolerance, improved balance, increased strength and increased range of motion.  Patient to discharge at a wheelchair level Modified Independent.   Patient's care partner is independent to provide the necessary physical assistance at discharge.  Reasons goals not met: N/A all goals met  Recommendation:  Patient will benefit from ongoing skilled PT services in home health setting to continue to advance safe functional mobility, address ongoing impairments in strength, ROM, endurance, and minimize fall risk.  Equipment: No equipment provided  Reasons for discharge: treatment goals met  Patient/family agrees with progress made and goals achieved: Yes  PT Discharge Precautions/Restrictions Precautions Precautions: Fall Precaution Comments: Prior Left BKA; left prosthetic. Recent R BKA.  Restrictions Weight Bearing Restrictions: Yes RLE Weight Bearing: Non weight bearing Vital Signs  Pain Pain Assessment Pain Scale: 0-10 Pain Score: 1  Pain Type: Acute pain;Surgical pain Pain Location: Leg Pain Orientation: Right Pain Descriptors / Indicators: Aching Pain Onset: On-going Vision/Perception  Perception Perception: Within Functional Limits Praxis Praxis: Intact  Cognition Overall Cognitive Status: Within Functional Limits for tasks assessed Arousal/Alertness: Awake/alert Orientation Level: Oriented X4 Attention: Focused Focused Attention: Appears intact Memory: Appears intact Awareness: Appears intact Problem Solving: Appears intact Safety/Judgment: Appears intact Sensation Sensation Light Touch: Appears Intact Hot/Cold: Appears Intact Proprioception: Appears  Intact Coordination Gross Motor Movements are Fluid and Coordinated: Yes Fine Motor Movements are Fluid and Coordinated: Yes Motor  Motor Motor: Within Functional Limits  Mobility Bed Mobility Bed Mobility: Rolling Right;Rolling Left;Supine to Sit;Sit to Supine Rolling Right: Independent Rolling Left: Independent Supine to Sit: Independent Sit to Supine: Independent Transfers Transfers: Lateral/Scoot Transfers;Anterior-Posterior Hotel manager: Independent with assistive device Lateral/Scoot Transfers: Independent with assistive device Transfer (Assistive device): None Locomotion  Gait Ambulation: No Gait Gait: No Stairs / Additional Locomotion Stairs: No Architect: Yes Wheelchair Assistance: Independent with Camera operator: Both upper extremities Wheelchair Parts Management: Supervision/cueing  Trunk/Postural Assessment  Cervical Assessment Cervical Assessment: Within Water engineer Thoracic Assessment: Within Functional Limits Lumbar Assessment Lumbar Assessment: Within Functional Limits Postural Control Postural Control: Within Functional Limits  Balance Balance Balance Assessed: Yes Static Sitting Balance Static Sitting - Balance Support: No upper extremity supported Static Sitting - Level of Assistance: 6: Modified independent (Device/Increase time) Dynamic Sitting Balance Dynamic Sitting - Balance Support: During functional activity;No upper extremity supported Dynamic Sitting - Level of Assistance: 6: Modified independent (Device/Increase time) Sitting balance - Comments: Able to maintain static sitting balance with supervision Extremity Assessment  RUE Assessment RUE Assessment: Within Functional Limits LUE Assessment LUE Assessment: Within Functional Limits RLE Assessment Passive Range of Motion (PROM) Comments: pt only able to extend knee to 25  degrees LLE Assessment LLE Assessment: Within Functional Limits    [name] 11/27/2018, 1:28 PM

## 2018-11-27 NOTE — Progress Notes (Addendum)
[facility] PHYSICAL MEDICINE & REHABILITATION PROGRESS NOTE  Subjective/Complaints: Patient seen sitting up in his chair this morning, working with therapies.  He states he slept well overnight.  He states he had a good weekend.  He is able to extend his right lower extremity better.  He engages minimally.  He states that he was told he is going home tomorrow.  Weekend notes reviewed.  ROS: Denies CP, shortness of breath, nausea, vomiting, diarrhea.  Objective: Vital Signs: Blood pressure (!) 123/32, pulse 70, temperature 98.1 F (36.7 C), temperature source Oral, resp. rate 18, height 6\' 2"  (1.88 m), weight 67.9 kg, SpO2 100 %. No results found. Recent Labs    11/24/18 1527  WBC 10.6*  HGB 7.2*  HCT 23.1*  PLT 821*   Recent Labs    11/24/18 1527  NA 129*  K 4.5  CL 92*  CO2 22  GLUCOSE 208*  BUN 46*  CREATININE 9.23*  CALCIUM 7.2*    Physical Exam: BP (!) 123/32 (BP Location: Left Arm)   Pulse 70   Temp 98.1 F (36.7 C) (Oral)   Resp 18   Ht 6\' 2"  (1.88 m)   Wt 67.9 kg   SpO2 100%   BMI 19.22 kg/m  Constitutional: No distress . Vital signs reviewed. HENT: Normocephalic.  Atraumatic. Eyes: EOMI.  No discharge. Cardiovascular: No JVD. Respiratory: Normal effort. GI: Non-distended. Musc: Right BKA with edema and tenderness, almost able to achieve full extension  Left BKA Neurological:Alert and oriented. Follows commands.  Motor:  Left lower extremity: Hip flexion, knee extension 5/5 Right lower extremity: Hip flexion, knee extension 4/5  Skin: Right BKA with dressing C/D/I Left healed BKA Psych: Very flat.  Assessment/Plan: 1. Functional deficits secondary to right BKA with history of left BKA which require 3+ hours per day of interdisciplinary therapy in a comprehensive inpatient rehab setting.  Physiatrist is providing close team supervision and 24 hour management of active medical problems listed below.  Physiatrist and rehab team continue to  assess barriers to discharge/monitor patient progress toward functional and medical goals  Care Tool:  Bathing    Body parts bathed by patient: Right arm, Left arm, Chest, Abdomen, Front perineal area, Buttocks, Right upper leg, Left upper leg, Face     Body parts n/a: Right lower leg, Left lower leg   Bathing assist Assist Level: Independent with assistive device     Upper Body Dressing/Undressing Upper body dressing   What is the patient wearing?: Pull over shirt    Upper body assist Assist Level: Independent    Lower Body Dressing/Undressing Lower body dressing      What is the patient wearing?: Pants     Lower body assist Assist for lower body dressing: Independent     Toileting Toileting    Toileting assist Assist for toileting: Independent     Transfers Chair/bed transfer  Transfers assist     Chair/bed transfer assist level: Independent with assistive device Chair/bed transfer assistive device: Armrests   Locomotion Ambulation   Ambulation assist   Ambulation activity did not occur: N/A          Walk 10 feet activity   Assist  Walk 10 feet activity did not occur: N/A        Walk 50 feet activity   Assist Walk 50 feet with 2 turns activity did not occur: N/A         Walk 150 feet activity   Assist Walk 150 feet activity did  not occur: N/A         Walk 10 feet on uneven surface  activity   Assist Walk 10 feet on uneven surfaces activity did not occur: N/A         Wheelchair     Assist Will patient use wheelchair at discharge?: Yes Type of Wheelchair: Manual    Wheelchair assist level: Independent Max wheelchair distance: 14ft    Wheelchair 50 feet with 2 turns activity    Assist        Assist Level: Independent   Wheelchair 150 feet activity     Assist     Assist Level: Independent      Medical Problem List and Plan: 1.Decreased functional mobilitysecondary to right BKA  11/17/2018 after failed TMA as well as history of left BKA July 2018  Continue CIR  Stump shrinker ordered  Plan for d/c tomorrow  Will see patient for transitional care management in 1-2 weeks post-discharge 2. Antithrombotics: -DVT/anticoagulation:Subcutaneous heparin. -antiplatelet therapy: aspirin 81 mg daily, Plavix 75 mg daily 3. Pain Management:MSIR 15 mg every 4 hours as needed  Relatively controlled on 4/13  Monitor with increased mobility 4. Mood:Provide emotional support -antipsychotic agents: N/A 5. Neuropsych: This patientiscapable of making decisions on hisown behalf. 6. Skin/Wound Care:routine skin checks 7. Fluids/Electrolytes/Nutrition:routine ins and outs  8. End-stage renal disease with hemodialysis. Follow-up renal services 9. Acute on chronic anemia.   Hemoglobin 7.2 on 4/10  Recs per nephro 10. ID. Continue vancomycin through 12/16/2018 for positive blood cultures 11/03/2018  Vancomycin level within normal limits on 4/13 11. Diabetes mellitus.NovoLog 3 units 3 times a day with meals, Lantus insulin 14 units daily at bedtime  Elevated, but?  Improving on 4/13  Monitor with increased mobility 12. CAD with CABG. Continue aspirin and Plavix.Imdur 30 mg daily, Coreg 6.25 mg twice a day 13. Hyperlipidemia. Lipitor 14. Constipation. Laxative assistance  LOS: 4 days A FACE TO FACE EVALUATION WAS PERFORMED  Ileah Falkenstein Lorie Phenix 11/27/2018, 1:21 PM

## 2018-11-27 NOTE — Discharge Instructions (Signed)
Inpatient Rehab Discharge Instructions  Epic Tribbett Sr. Discharge date and time: No discharge date for patient encounter.   Activities/Precautions/ Functional Status: Activity:as tolerated Diet: renal diet Wound Care: keep wound clean and dry Functional status:  ___ No restrictions     ___ Walk up steps independently ___ 24/7 supervision/assistance   ___ Walk up steps with assistance ___ Intermittent supervision/assistance  ___ Bathe/dress independently ___ Walk with walker     _x__ Bathe/dress with assistance ___ Walk Independently    ___ Shower independently ___ Walk with assistance    ___ Shower with assistance ___ No alcohol     ___ Return to work/school ________  Special Instructions: Continue dialysis as directed  No smoking ,alcohol or smoking  Continue vancomycin 1000 mg Monday Wednesday Friday with hemodialysis 12/16/2018 and stop    COMMUNITY REFERRALS UPON DISCHARGE:    Home Health:   PT, OT, RN     [place]   Date of last service:11/28/2018  Medical Equipment/Items Ordered:HAS ALL NEEDED EQUIPMENT     GENERAL COMMUNITY RESOURCES FOR PATIENT/FAMILY: Support Groups:[facility]PPORT [facility] THE SECOND Thursday OF EACH MONTH @ [facility] ROOM 1H105 QUESTIONS CONTACT ROBIN [phone]  My questions have been answered and I understand these instructions. I will adhere to these goals and the provided educational materials after my discharge from the hospital.  Patient/Caregiver Signature _______________________________ Date __________  Clinician Signature _______________________________________ Date __________  Please bring this form and your medication list with you to all your follow-up doctor's appointments.

## 2018-11-27 NOTE — Discharge Summary (Addendum)
Physician Discharge Summary  Patient ID: [name] Nickolson Sr. MRN: [id number] DOB/AGE: June 10, 1976 43 y.o.  Admit date: 11/23/2018 Discharge date: 11/28/2018  Discharge Diagnoses:  Active Problems:   S/P bilateral below knee amputation (HCC)   Unilateral complete BKA, right, initial encounter ([place])   Diabetes mellitus type 2 in nonobese ([place])   Bacteremia   Postoperative pain CAD with CABG Hyperlipidemia Acute on chronic anemia Constipation  Discharged Condition: Stable  Significant Diagnostic Studies: Dg Chest 2 View  Result Date: 11/03/2018 CLINICAL DATA:  Shortness of breath.  Chest pain. EXAM: CHEST - 2 VIEW COMPARISON:  09/07/2018. FINDINGS: Prior median sternotomy and CABG. Cardiomegaly with normal pulmonary vascularity. Mild right perihilar infiltrate can not be excluded. No pleural effusion or pneumothorax IMPRESSION: 1.  Prior CABG. 2.  Mild right perihilar infiltrate can not be excluded. Electronically Signed   By: Marcello Moores  Register   On: 11/03/2018 10:42   Dg Foot Complete Right  Result Date: 11/03/2018 CLINICAL DATA:  Foot pain.  Fever. EXAM: RIGHT FOOT COMPLETE - 3+ VIEW COMPARISON:  Right foot x-rays dated October 04, 2018. FINDINGS: Postsurgical changes related to transmetatarsal amputation again noted. Surgical margins are maintained. No osseous destruction or periosteal reaction. No acute fracture or dislocation. Joint spaces are preserved. Osteopenia. Soft tissue swelling of the stump with small amount of subcutaneous emphysema overlying the residual second through fourth metatarsals. Vascular calcifications. IMPRESSION: 1. Prior transmetatarsal amputation with stump soft tissue swelling and new subcutaneous emphysema overlying the residual second through fourth metatarsals, concerning for infection. No radiographic evidence of osteomyelitis. Electronically Signed   By: Titus Dubin M.D.   On: 11/03/2018 10:46    Labs:  Basic Metabolic Panel: Recent Labs  Lab  11/22/18 0840 11/24/18 1527  NA 126* 129*  K 3.5 4.5  CL 92* 92*  CO2 22 22  GLUCOSE 175* 208*  BUN 49* 46*  CREATININE 9.41* 9.23*  CALCIUM 6.9* 7.2*  PHOS 3.5 4.9*    CBC: Recent Labs  Lab 11/22/18 0840 11/24/18 1527  WBC 10.0 10.6*  HGB 7.0* 7.2*  HCT 23.5* 23.1*  MCV 81.3 81.6  PLT 761* 821*    CBG: Recent Labs  Lab 11/26/18 0635 11/26/18 1226 11/26/18 1644 11/26/18 2059 11/27/18 0630  GLUCAP 228* 131* 128* 107* 119*   Family history.  Mother with heart failure and hypertension.  Denies any cancer or diabetes  Brief HPI:    [name] is a 43 year old right-handed African-American male history of CAD with CABG maintained on aspirin and Plavix, end-stage renal disease with hemodialysis, diabetes mellitus, hypertension, left BKA July 2018 discharged to a skilled nursing facility.  He lives in subsidized housing.  Has a home health aide 5 hours a day.  Used a left prosthesis in wheelchair prior to admission.  Presented for 11/03/2018 with noted multiple revascularization procedures of right lower extremity right second toe amputation and poor healing.  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 Monica Martinez.  Hospital course pain management.  Patient remained on vancomycin through 12/16/2018 for blood cultures back on 11/03/2018 that were positive.  Hemodialysis ongoing as per renal services.  Acute on chronic anemia and monitored.  Subcutaneous heparin for DVT prophylaxis.  Therapy evaluations completed and patient was admitted for a comprehensive rehab program.  Hospital Course: [name] Antuna Sr. was admitted to rehab 11/23/2018 for inpatient therapies to consist of PT, ST and OT at least three hours five days a week. Past  admission physiatrist, therapy team and rehab RN have worked together to provide customized collaborative inpatient rehab.  Pertaining to patient right BKA 11/17/2018 after failed TMA surgical site clean  and dry he would follow-up vascular surgery.  Left BKA July 2018 patient did have a prosthesis.  Subcutaneous heparin for DVT prophylaxis no bleeding episodes.  Pain management use of MSIR as needed.  Patient remained on hemodialysis as per renal services.  Acute on chronic anemia monitored as per dialysis.  Patient remained on vancomycin through 12/16/2018 for positive blood cultures dated 11/03/2018 he remained afebrile.  Blood sugars controlled he remained on Lantus insulin.  No chest pain or shortness of breath noted history of CAD with CABG he remained on aspirin Plavix therapy.  Physical exam.  Blood pressure 115/44 pulse 79 temperature 98 respirations 13 oxygen saturation 97% room air Constitutional.  Well-developed well-nourished HEENT.  Head normocephalic atraumatic Eyes.  EOMs normal no nystagmus without discharge Neck supple normal range of motion no thyromegaly without JVD Cardiac regular rate without murmur rubs Respiratory effort normal breath sounds normal GI.  Soft bowel sounds normal no distention nontender Neurological.  Alert mood was bit flat follow commands fair medical historian Skin.  Right BKA dressed left BKA well-healed Motor strength 5 out of 5 bilateral deltoid bicep tricep grip as well as left hip flexion.  Right hip flexion not tested secondary to recent surgery right lower extremity.  Sensation intact light touch.  Rehab course: During patient's stay in rehab weekly team conferences were held to monitor patient's progress, set goals and discuss barriers to discharge. At admission, patient required moderate assist sit to stand, minimal assist lateral scoot transfers, minimal guard sit to supine and supine to sit.  Minimal guard upper body bathing, moderate assist lower body bathing, minimal guard sitting upper body dressing.  He  has had improvement in activity tolerance, balance, postural control as well as ability to compensate for deficits. He/She has had improvement in  functional use RUE/LUE  and RLE/LLE as well as improvement in awareness working with energy conservation techniques.  Transferred to sitting with supervision donning his pants with supervision.  Performed anterior posterior transfers to the wheelchair with supervised set up and assistance.  Propels wheelchair supervision.  Patient transferred to wheelchair with supervision propelling to the gymnasium.  Activities of daily living and homemaking can don his shirt and pants bed level modified independent.  Completed transfers to wheelchair with supervision.  Full teaching completed and plan discharged home       Disposition: Discharged home There are no questions and answers to display.         Diet: Renal diet  Special Instructions: No driving smoking or alcohol  Continue vancomycin 1000 mg Monday Wednesday Friday with hemodialysis until 12/16/2018 and stop  Medications at discharge. 1.  Tylenol as needed 2.  Aspirin 81 mg p.o. daily 3.  Lipitor 80 mg p.o. daily 4.  Rocaltrol 1.25 mcg Monday Wednesday Friday dialysis 5.  Coreg 6.25 mg p.o. twice daily 6.  Plavix 75 mg p.o. daily 7.  Colace 100 mg p.o. daily 8.  NovoLog 3 units 3 times daily with meals 9.  Lantus insulin 14 units nightly 10.  Imdur 30 mg p.o. daily 11.fosrenol 2000 mg p.o. 3 times daily 12.  MSIR 15 mg every 4 hours as needed pain 13.  Multivitamin daily 14 Protonix 40 mg p.o. daily 15 vancomycin 1000 mg Monday Wednesday Friday with dialysis through 12/16/2018 and stop Discharge Instructions  Ambulatory referral to Physical Medicine Rehab   Complete by:  As directed    Moderate complexity follow-up 1-2 weeks bilateral BKA      Follow-up Information    Jamse Arn, MD Follow up.   Specialty:  Physical Medicine and Rehabilitation Why:  call for appointment Contact information: [address] [place] [address] [phone]        [name], MD Follow up.   Specialty:   Vascular Surgery Why:  call for appointment Contact information: [place] [address] [phone]        Josue Hector, MD Follow up.   Specialty:  Cardiology Why:  call for appointment as needed Contact information: [address] [address] [phone]        Roney Jaffe, MD Follow up.   Specialty:  Nephrology Why:  as directed Contact information: [place] [address] [phone]           Signed: Lavon Paganini [place] 11/27/2018, 9:16 AM Patient seen and examined by me on day of discharge. Delice Lesch, MD, ABPMR

## 2018-11-27 NOTE — Progress Notes (Signed)
Occupational Therapy Session Note  Patient Details  Name: [name]. MRN: [id number] Date of Birth: [birth year]  Today's Date: 11/27/2018 OT Individual Time: 5361-4431 OT Individual Time Calculation (min): 55 min    Short Term Goals: Week 1:  OT Short Term Goal 1 (Week 1): NA due to short LOS anticipated  Skilled Therapeutic Interventions/Progress Updates:    Session focused on d/c planning, desentiziation edu/principles, and ADL transfers. Pt with c/o pain in his R residual limb and RN was notified and administered pain medication. Pt completed brief wash up at sink with mod I. Pt completed toilet transfer with mod I ,using modified anterior/posterior transfer and lateral scoot. Pt propelled w/c 150 ft with mod I. Extensive care coordination completed re d/c tomorrow. Pt completed transfer to therapy mat in gym. Pt engaged in BUE strengthening circuit holding 5lb dumbbells. Constant demonstration/cueing provided throughout. Pt returned to w/c and propelled back to room. Pt was left supine with all needs met, bed alarm set.   Therapy Documentation Precautions:  Precautions Precautions: Fall Precaution Comments: Prior Left BKA; left prosthetic. Recent R BKA.  Restrictions Weight Bearing Restrictions: Yes RLE Weight Bearing: Non weight bearing Pain: Pain Assessment Pain Scale: 0-10 Pain Score: 7  Pain Type: Acute pain;Surgical pain Pain Location: Leg Pain Orientation: Right;Distal Pain Descriptors / Indicators: Aching Pain Onset: On-going Pain Intervention(s): RN made aware;Medication (See eMAR)   Therapy/Group: Individual Therapy  Curtis Sites 11/27/2018, 8:29 AM

## 2018-11-27 NOTE — Progress Notes (Signed)
Occupational Therapy Session Note  Patient Details  Name: [name] Losito Sr. MRN: [id number] Date of Birth: Jul 16, 1976  Today's Date: 11/27/2018 OT Individual Time: 3810-1751 OT Individual Time Calculation (min): 45 min    Short Term Goals: Week 1:  OT Short Term Goal 1 (Week 1): NA due to short LOS anticipated  Skilled Therapeutic Interventions/Progress Updates:    Patient notes that his stomach is a little upset but agreeable to participate in therapy session.  Ant/ post transfer to/from commode and w/c mod I, clothing management and hygiene after BM mod I, LB dressing mod I.  Patient completed w/c set up mod I.  Reviewed and practiced reach and transport of items in a kitchen setting with good carryover and safety.  Patient able to propel w/c around therapy unit without difficulty.  Patient states that he feels prepared to return to home setting tomorrow.   Patient returned to bed for a rest break at close of session with all items within reach.    Therapy Documentation Precautions:  Precautions Precautions: Fall Precaution Comments: Prior Left BKA; left prosthetic. Recent R BKA.  Restrictions Weight Bearing Restrictions: Yes RLE Weight Bearing: Non weight bearing General:   Vital Signs:  Pain: Pain Assessment Pain Scale: 0-10 Pain Score: 1  Pain Type: Acute pain;Surgical pain Pain Location: Leg Pain Orientation: Right Pain Descriptors / Indicators: Aching Pain Onset: On-going Pain Intervention(s): RN made aware;Medication (See eMAR)   Therapy/Group: Individual Therapy  Carlos Levering 11/27/2018, 11:49 AM

## 2018-11-27 NOTE — Progress Notes (Addendum)
[facility] Progress Note   Subjective: Seen on HD. Very quiet, no C/Os.   Objective Vitals:   11/26/18 1945 11/26/18 2215 11/27/18 0631 11/27/18 1300  BP:  115/61 (!) 123/32 (!) 127/41  Pulse:  85 70 80  Resp:  17 18 18   Temp: 98.3 F (36.8 C) 98.4 F (36.9 C) 98.1 F (36.7 C) 98.7 F (37.1 C)  TempSrc: Oral Oral Oral Oral  SpO2:  99% 100% 100%  Weight:      Height:       Physical Exam General: chronically ill appearing male in NAD Heart: S1,S2 RRR Lungs: CTAB A/P Abdomen: S, NT Extremities: well healed L BKA-no stump edema. R BKA with ace wrap in place.  Dialysis Access: L AVF cannulated at present.    Additional Objective Labs: Basic Metabolic Panel: Recent Labs  Lab 11/22/18 0840 11/24/18 1527  NA 126* 129*  K 3.5 4.5  CL 92* 92*  CO2 22 22  GLUCOSE 175* 208*  BUN 49* 46*  CREATININE 9.41* 9.23*  CALCIUM 6.9* 7.2*  PHOS 3.5 4.9*   Liver Function Tests: Recent Labs  Lab 11/22/18 0840 11/24/18 1527  ALBUMIN 1.7* 1.8*   No results for input(s): LIPASE, AMYLASE in the last 168 hours. CBC: Recent Labs  Lab 11/22/18 0840 11/24/18 1527  WBC 10.0 10.6*  HGB 7.0* 7.2*  HCT 23.5* 23.1*  MCV 81.3 81.6  PLT 761* 821*   Blood Culture    Component Value Date/Time   SDES BLOOD RIGHT ARM 11/04/2018 1007   SPECREQUEST  11/04/2018 1007    BOTTLES DRAWN AEROBIC ONLY Blood Culture adequate volume   CULT  11/04/2018 1007    NO GROWTH 5 DAYS Performed at [facility], [place] [address], [place], [place] [address]    REPTSTATUS 11/09/2018 FINAL 11/04/2018 1007    Cardiac Enzymes: No results for input(s): CKTOTAL, CKMB, CKMBINDEX, TROPONINI in the last 168 hours. CBG: Recent Labs  Lab 11/26/18 1226 11/26/18 1644 11/26/18 2059 11/27/18 0630 11/27/18 1151  GLUCAP 131* 128* 107* 119* 138*   Iron Studies: No results for input(s): IRON, TIBC, TRANSFERRIN, FERRITIN in the last 72 hours. @lablastinr3 @ Studies/Results: No results  found. Medications: . vancomycin     . aspirin EC  81 mg Oral Daily  . atorvastatin  80 mg Oral q1800  . calcitRIOL  1.25 mcg Oral Q M,W,F-HD  . carvedilol  6.25 mg Oral BID  . Chlorhexidine Gluconate Cloth  6 each Topical Q0600  . clopidogrel  75 mg Oral Daily  . darbepoetin (ARANESP) injection - DIALYSIS  200 mcg Intravenous Q Mon-HD  . docusate sodium  100 mg Oral Daily  . heparin  5,000 Units Subcutaneous Q8H  . insulin aspart  0-9 Units Subcutaneous TID WC  . insulin aspart  3 Units Subcutaneous TID WC  . insulin glargine  14 Units Subcutaneous QHS  . isosorbide mononitrate  30 mg Oral Daily  . lanthanum  2,000 mg Oral TID WC  . multivitamin  1 tablet Oral QHS  . 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 On Vancomycin until 12/16/18. Now on [facility] for rehab.   2. ESRD -MWF next HD today.  3.  Anemia - HGB 7.2 11/24/18. Checked CBC with HD today-HGB down to 6.6. Transfuse 1 unit PRBCs today.  Due for Aranesp 200 mcg IV today. Follow HGB.  4. Secondary hyperparathyroidism -Checking labs today. Phos 4.9 Ca 7.2 C Ca 9.0. Continue VDRA, binders.  5. HTN/volume - BP controlled. Last HD 11/24/18 Pre wt 76.2 kg Net UF 3.0 L Post wt 67.9. Attempt 2-3 liters with HD today. Will need lower EDW factoring for R BKA and wt loss while in hospital.  6. Nutrition - Albumin 1.8. Renal/Carb mod diet, prostat, renal vits. 7. Combined systolic and diastolic HF  8. DM-per primary 9. H/O PAD L BKA 10 H/O CAD  Rita H. Brown NP-C 11/27/2018, 2:26 PM  [facility] [phone]  Pt seen, examined and agree w A/P as above.  [facility] 11/27/2018, 3:29 PM

## 2018-11-27 NOTE — Progress Notes (Signed)
Occupational Therapy Discharge Summary  Patient Details  Name: [name] Lienhard Sr. MRN: [id number] Date of Birth: November 12, 1975  Patient has met 9 of 9 long term goals due to improved activity tolerance, improved balance and ability to compensate for deficits.  Patient to discharge at overall Modified Independent level.  Patient's care partner is independent to provide the necessary physical assistance at discharge.    Reasons goals not met: na all goals met  Recommendation:  Patient will benefit from ongoing skilled OT services in home health setting to continue to advance functional skills in the area of BADL.  Equipment: No equipment provided  Reasons for discharge: treatment goals met and discharge from hospital  Patient/family agrees with progress made and goals achieved: Yes  OT Discharge Precautions/Restrictions  Precautions Precautions: Fall Precaution Comments: Prior Left BKA; left prosthetic. Recent R BKA.  Restrictions Weight Bearing Restrictions: Yes RLE Weight Bearing: Non weight bearing Pain Pain Assessment Pain Scale: 0-10 Pain Score: 1  Pain Type: Acute pain;Surgical pain Pain Location: Leg Pain Orientation: Right Pain Descriptors / Indicators: Aching Pain Onset: On-going Pain Intervention(s): RN made aware;Medication (See eMAR) ADL ADL Eating: Independent Where Assessed-Eating: Bed level Grooming: Independent Where Assessed-Grooming: Sitting at sink Upper Body Bathing: Setup Where Assessed-Upper Body Bathing: Wheelchair Lower Body Bathing: Modified independent Where Assessed-Lower Body Bathing: Sitting at sink Upper Body Dressing: Modified independent (Device) Where Assessed-Upper Body Dressing: Wheelchair Lower Body Dressing: Modified independent Where Assessed-Lower Body Dressing: Wheelchair Toileting: Modified independent Where Assessed-Toileting: Toilet, Research scientist (medical) Method: Other  (comment)(lateral scoot) Tub/Shower Transfer: Modified independent Clinical cytogeneticist Method: (lateral scoot) Tub/Shower Equipment: Radio broadcast assistant ADL Comments: not cleared to shower, patient declines toilet transfer Vision Baseline Vision/History: No visual deficits Patient Visual Report: No change from baseline Vision Assessment?: No apparent visual deficits Perception  Perception: Within Functional Limits Praxis Praxis: Intact Cognition Overall Cognitive Status: Within Functional Limits for tasks assessed Arousal/Alertness: Awake/alert Orientation Level: Oriented X4 Attention: Focused Focused Attention: Appears intact Memory: Appears intact Awareness: Appears intact Problem Solving: Appears intact Safety/Judgment: Appears intact Sensation Sensation Light Touch: Appears Intact Hot/Cold: Appears Intact Proprioception: Appears Intact Coordination Gross Motor Movements are Fluid and Coordinated: Yes Fine Motor Movements are Fluid and Coordinated: Yes Motor  Motor Motor: Within Functional Limits Mobility  Bed Mobility Bed Mobility: Rolling Right;Rolling Left;Supine to Sit;Sit to Supine Rolling Right: Independent Rolling Left: Independent Supine to Sit: Independent Sit to Supine: Independent  Trunk/Postural Assessment  Cervical Assessment Cervical Assessment: Within Functional Limits Thoracic Assessment Thoracic Assessment: Within Functional Limits Lumbar Assessment Lumbar Assessment: Within Functional Limits Postural Control Postural Control: Within Functional Limits  Balance Balance Balance Assessed: Yes Static Sitting Balance Static Sitting - Balance Support: No upper extremity supported Static Sitting - Level of Assistance: 6: Modified independent (Device/Increase time) Dynamic Sitting Balance Dynamic Sitting - Balance Support: During functional activity;No upper extremity supported Dynamic Sitting - Level of Assistance: 6: Modified independent  (Device/Increase time) Sitting balance - Comments: Able to maintain static sitting balance with supervision Extremity/Trunk Assessment RUE Assessment RUE Assessment: Within Functional Limits LUE Assessment LUE Assessment: Within Functional Limits   Curtis Sites 11/27/2018, 12:01 PM

## 2018-11-28 ENCOUNTER — Inpatient Hospital Stay (HOSPITAL_COMMUNITY): Payer: Medicaid Other | Admitting: Physical Therapy

## 2018-11-28 LAB — GLUCOSE, CAPILLARY: Glucose-Capillary: 122 mg/dL — ABNORMAL HIGH (ref 70–99)

## 2018-11-28 LAB — TYPE AND SCREEN
ABO/RH(D): B POS
Antibody Screen: NEGATIVE
Unit division: 0

## 2018-11-28 LAB — BPAM RBC
Blood Product Expiration Date: 202005012359
ISSUE DATE / TIME: 202004131655
Unit Type and Rh: 7300

## 2018-11-28 MED ORDER — PANTOPRAZOLE SODIUM 40 MG PO TBEC: 40.0000 mg | DELAYED_RELEASE_TABLET | Freq: Every day | ORAL | 0 refills | Status: DC

## 2018-11-28 MED ORDER — INSULIN GLARGINE 100 UNIT/ML SOLOSTAR PEN: 14.0000 [IU] | PEN_INJECTOR | Freq: Every day | SUBCUTANEOUS | 11 refills | Status: DC

## 2018-11-28 MED ORDER — MORPHINE SULFATE 15 MG PO TABS: 15.0000 mg | ORAL_TABLET | ORAL | 0 refills | Status: DC | PRN

## 2018-11-28 MED ORDER — VANCOMYCIN HCL IN DEXTROSE 750-5 MG/150ML-% IV SOLN: INTRAVENOUS | 0 refills | Status: DC

## 2018-11-28 MED ORDER — DARBEPOETIN ALFA 200 MCG/0.4ML IJ SOSY: 200.0000 ug | PREFILLED_SYRINGE | INTRAMUSCULAR | Status: AC

## 2018-11-28 MED ORDER — ISOSORBIDE MONONITRATE ER 30 MG PO TB24: 30.0000 mg | ORAL_TABLET | Freq: Every day | ORAL | 0 refills | Status: DC

## 2018-11-28 MED ORDER — LANTHANUM CARBONATE 1000 MG PO CHEW: 2000.0000 mg | CHEWABLE_TABLET | Freq: Three times a day (TID) | ORAL | 0 refills | Status: DC

## 2018-11-28 MED ORDER — INSULIN ASPART 100 UNIT/ML FLEXPEN: 3.0000 [IU] | PEN_INJECTOR | Freq: Three times a day (TID) | SUBCUTANEOUS | 11 refills | Status: DC

## 2018-11-28 MED ORDER — ATORVASTATIN CALCIUM 80 MG PO TABS: 80.0000 mg | ORAL_TABLET | Freq: Every day | ORAL | 0 refills | Status: DC

## 2018-11-28 MED ORDER — DOCUSATE SODIUM 100 MG PO CAPS: 100.0000 mg | ORAL_CAPSULE | Freq: Every day | ORAL | 0 refills | Status: DC

## 2018-11-28 MED ORDER — CLOPIDOGREL BISULFATE 75 MG PO TABS: 75.0000 mg | ORAL_TABLET | Freq: Every day | ORAL | 6 refills | Status: DC

## 2018-11-28 MED ORDER — CARVEDILOL 6.25 MG PO TABS: 6.2500 mg | ORAL_TABLET | Freq: Two times a day (BID) | ORAL | 0 refills | Status: DC

## 2018-11-28 NOTE — Progress Notes (Signed)
2200 pt called because fistula was bleeding. The dressing  had large amount of drainage and there was bloody drainage on sheets. Dressing removed small amount of blood noted at site. Site cleansed and pressure applied for two minutes. Dressing applied to site. Pt placed on BSC large soft BM. Dressing remains clean and dry. Pt resting in bed.

## 2018-12-01 ENCOUNTER — Telehealth: Payer: Self-pay

## 2018-12-01 NOTE — Telephone Encounter (Signed)
Transitional Care call-Patient    1. Are you/is patient experiencing any problems since coming home?No Are there any questions regarding any aspect of care?No 2. Are there any questions regarding medications administration/dosing? No Are meds being taken as prescribed?Yes Patient should review meds with caller to confirm 3. Have there been any falls?Yes one 11/30/2018 4. Has Home Health been to the house and/or have they contacted you?Yes If not, have you tried to contact them? Can we help you contact them? 5. Are bowels and bladder emptying properly?Yes, expect bladder on dialysis Are there any unexpected incontinence issues? Dialysis If applicable, is patient following bowel/bladder programs? 6. Any fevers, problems with breathing, unexpected pain?No 7. Are there any skin problems or new areas of breakdown?No 8. Has the patient/family member arranged specialty MD follow up (ie cardiology/neurology/renal/surgical/etc)?No advised patient to call to arrange an appt  Can we help arrange? 9. Does the patient need any other services or support that we can help arrange?No 10. Are caregivers following through as expected in assisting the patient?Yes 11. Has the patient quit smoking, drinking alcohol, or using drugs as recommended?Yes  Appointment time 1:20pm arrive 1:00pm on January 15, 2019 with Dr. Posey Pronto [address] [phone]

## 2018-12-11 ENCOUNTER — Telehealth (HOSPITAL_COMMUNITY): Payer: Self-pay | Admitting: Rehabilitation

## 2018-12-11 NOTE — Telephone Encounter (Signed)
The above patient or their representative was contacted and gave the following answers to these questions:         Do you have any of the following symptoms? No  Fever                    Cough                   Shortness of breath  Do  you have any of the following other symptoms? No   muscle pain         vomiting,        diarrhea        rash         weakness        red eye        abdominal pain         bruising          bruising or bleeding              joint pain           severe headache    Have you been in contact with someone who was or has been sick in the past 2 weeks? No  Yes                 Unsure                         Unable to assess   Does the person that you were in contact with have any of the following symptoms?   Cough         shortness of breath           muscle pain         vomiting,            diarrhea            rash            weakness           fever            red eye           abdominal pain           bruising  or  bleeding                joint pain                severe headache               Have you  or someone you have been in contact with traveled internationally in th last month? No        If yes, which countries?   Have you  or someone you have been in contact with traveled outside [place] in th last month? No         If yes, which state and city?   COMMENTS OR ACTION PLAN FOR THIS PATIENT:          

## 2018-12-12 ENCOUNTER — Ambulatory Visit (INDEPENDENT_AMBULATORY_CARE_PROVIDER_SITE_OTHER): Payer: Self-pay | Admitting: Vascular Surgery

## 2018-12-12 ENCOUNTER — Encounter: Payer: Self-pay | Admitting: Vascular Surgery

## 2018-12-12 ENCOUNTER — Other Ambulatory Visit: Payer: Self-pay

## 2018-12-12 ENCOUNTER — Inpatient Hospital Stay: Payer: Self-pay | Admitting: Infectious Diseases

## 2018-12-12 VITALS — BP 182/66 | HR 56 | Temp 97.7°F | Resp 16 | Wt 156.5 lb

## 2018-12-12 DIAGNOSIS — S88111A Complete traumatic amputation at level between knee and ankle, right lower leg, initial encounter: Secondary | ICD-10-CM

## 2018-12-12 MED ORDER — HYDROCODONE-ACETAMINOPHEN 5-325 MG PO TABS: 1.0000 | ORAL_TABLET | Freq: Four times a day (QID) | ORAL | 0 refills | Status: DC | PRN

## 2018-12-12 NOTE — Progress Notes (Signed)
Patient name: [name] Woodbury Sr. MRN: [id number] DOB: 04-Aug-1976 Sex: male  REASON FOR VISIT: Staple removal after R BKA  HPI: [name] Word Sr. is a 43 y.o. male that underwent right lower extremity arteriogram with atherectomy of his TP trunk and peroneal artery on 08/07/2018 for critical ischemia with tissue loss.  Ultimately he required going to the OR for fasciotomies after he had a hematoma in his popliteal space and subsequently got a right fifth toe amp.  He wanted to try and save leg and underwent TMA on 09/14/18.  This failed to heal and then R BKA on 11/17/18.  He presents today for staple removal.  States doing well.  No fevers or chills.  Stump healing.  Past Medical History:  Diagnosis Date  . Anemia   . Atherosclerosis of lower extremity ([place])   . Blood clot in vein    right calf  . CAD (coronary artery disease)   . Cataracts, bilateral   . Chronic combined systolic and diastolic heart failure ([place])   . Complication of anesthesia   . Depression   . ESRD (end stage renal disease) on dialysis [facility])    "MWF [employer]" (03/08/2017)  . GERD (gastroesophageal reflux disease)   . Heart murmur   . History of blood transfusion    "related to OR"  . Hypertension   . Myocardial infarction ([place])     " light"  . Nonhealing surgical wound    nonviable tissue  . PONV (postoperative nausea and vomiting)   . S/P unilateral BKA (below knee amputation), left ([place])   . Type II diabetes mellitus ([place])   . Wears glasses     Past Surgical History:  Procedure Laterality Date  . ABDOMINAL AORTOGRAM N/A 11/02/2016   Procedure: Abdominal Aortogram;  Surgeon: Waynetta Sandy, MD;  Location: [place] [facility];  Service: Cardiovascular;  Laterality: N/A;  . ABDOMINAL AORTOGRAM W/LOWER EXTREMITY Right 08/07/2018   Procedure: ABDOMINAL AORTOGRAM W/LOWER EXTREMITY;  Surgeon: [name], MD;  Location: [place] [facility];  Service: Cardiovascular;   Laterality: Right;  . AMPUTATION Left 09/27/2013   Procedure: LEFT GREAT TOE AMPUTATION;  Surgeon: [name], MD;  Location: [place];  Service: Orthopedics;  Laterality: Left;  . AMPUTATION Right 08/15/2015   Procedure: Right Great Toe Amputation;  Surgeon: [name], MD;  Location: [place];  Service: Orthopedics;  Laterality: Right;  . AMPUTATION Left 11/05/2016   Procedure: TRANSMETATARSAL AMPUTATION LEFT FOOT;  Surgeon: [name], MD;  Location: [place];  Service: Orthopedics;  Laterality: Left;  . AMPUTATION Left 03/11/2017   Procedure: LEFT BELOW KNEE AMPUTATION;  Surgeon: [name], MD;  Location: [place];  Service: Orthopedics;  Laterality: Left;  . AMPUTATION Right 03/11/2017   Procedure: RIGHT 2ND TOE AMPUTATION;  Surgeon: [name], MD;  Location: [place];  Service: Orthopedics;  Laterality: Right;  . AMPUTATION Right 11/17/2018   Procedure: AMPUTATION BELOW KNEE;  Surgeon: [name], MD;  Location: [place];  Service: Vascular;  Laterality: Right;  . AV FISTULA PLACEMENT  left arm  . CORONARY ARTERY BYPASS GRAFT N/A 11/04/2017   Procedure: CORONARY ARTERY BYPASS GRAFTING (CABG) x three, using left internal mammary artery and right    leg greater saphenous vein;  Surgeon: Melrose Nakayama, MD;  Location: [place];  Service: Open Heart Surgery;  Laterality: N/A;  . FASCIOTOMY Right 08/07/2018   Procedure: FOUR COMPARTMENT FASCIOTOMY OF RIGHT LOWER LEG;  Surgeon: Rosetta Posner, MD;  Location: [place];  Service: Vascular;  Laterality: Right;  . FASCIOTOMY CLOSURE Right 08/10/2018   Procedure: FASCIOTOMY CLOSURE RIGHT LOWER EXTREMITY;  Surgeon: [name], MD;  Location: [place];  Service: Vascular;  Laterality: Right;  . HEMATOMA EVACUATION Right 08/07/2018   Procedure: EVACUATION HEMATOMA;  Surgeon: Rosetta Posner, MD;  Location: [place];  Service: Vascular;  Laterality: Right;  . LEFT HEART CATH AND CORONARY ANGIOGRAPHY N/A 10/31/2017   Procedure: LEFT HEART CATH AND  CORONARY ANGIOGRAPHY;  Surgeon: Troy Sine, MD;  Location: [place] [facility];  Service: Cardiovascular;  Laterality: N/A;  . LEFT HEART CATHETERIZATION WITH CORONARY ANGIOGRAM N/A 09/13/2014   Procedure: LEFT HEART CATHETERIZATION WITH CORONARY ANGIOGRAM;  Surgeon: Sinclair Grooms, MD;  Location: [facility] C[facility] LAB;  Service: Cardiovascular;  Laterality: N/A;  . LOWER EXTREMITY ANGIOGRAPHY Bilateral 11/02/2016   Procedure: Lower Extremity Angiography;  Surgeon: Waynetta Sandy, MD;  Location: [place] [facility];  Service: Cardiovascular;  Laterality: Bilateral;  . PERIPHERAL VASCULAR ATHERECTOMY Left 11/02/2016   Procedure: Peripheral Vascular Atherectomy;  Surgeon: Waynetta Sandy, MD;  Location: [place] [facility];  Service: Cardiovascular;  Laterality: Left;  PERONEAL  . PERIPHERAL VASCULAR ATHERECTOMY Right 08/07/2018   Procedure: PERIPHERAL VASCULAR ATHERECTOMY;  Surgeon: [name], MD;  Location: [place] [facility];  Service: Cardiovascular;  Laterality: Right;  Peroneal artery  . PERIPHERAL VASCULAR BALLOON ANGIOPLASTY Right 08/07/2018   Procedure: PERIPHERAL VASCULAR BALLOON ANGIOPLASTY;  Surgeon: [name], MD;  Location: [place] [facility];  Service: Cardiovascular;  Laterality: Right;  anterior tibial artery  . STUMP REVISION Left 01/11/2017   Procedure: Revision Left Transmetatarsal Amputation;  Surgeon: [name], MD;  Location: [place];  Service: Orthopedics;  Laterality: Left;  . TEE WITHOUT CARDIOVERSION N/A 11/04/2017   Procedure: TRANSESOPHAGEAL ECHOCARDIOGRAM (TEE);  Surgeon: Melrose Nakayama, MD;  Location: [place];  Service: Open Heart Surgery;  Laterality: N/A;  . TRANSMETATARSAL AMPUTATION Right 08/10/2018   Procedure: RIGHT FIFTH TOE AMPUTATION;  Surgeon: [name], MD;  Location: [place];  Service: Vascular;  Laterality: Right;  . TRANSMETATARSAL AMPUTATION Right 09/14/2018   Procedure: TRANSMETATARSAL AMPUTATION;  Surgeon:  [name], MD;  Location: [facility] OR;  Service: Vascular;  Laterality: Right;    Family History  Problem Relation Age of Onset  . Heart failure Mother   . Hypertension Mother     SOCIAL HISTORY: Social History   Tobacco Use  . Smoking status: Former Smoker    Packs/day: 0.50    Years: 26.00    Pack years: 13.00    Types: Cigarettes    Last attempt to quit: 10/25/2018    Years since quitting: 0.1  . Smokeless tobacco: Never Used  Substance Use Topics  . Alcohol use: Not Currently    Comment:  "haven't took a drink in over 5 years"    Allergies  Allergen Reactions  . Coconut Oil Anaphylaxis and Other (See Comments)    Can use topically, allergic to coconut foods     Current Outpatient Medications  Medication Sig Dispense Refill  . acetaminophen (TYLENOL) 325 MG tablet Take 2 tablets (650 mg total) by mouth every 4 (four) hours as needed for headache or mild pain.    Marland Kitchen aspirin EC 81 MG tablet Take 1 tablet (81 mg total) by mouth daily. 30 tablet 11  . atorvastatin (LIPITOR) 80 MG tablet Take 1 tablet (80 mg  total) by mouth daily at 6 PM. 30 tablet 0  . calcitRIOL (ROCALTROL) 0.25 MCG capsule Take 5 capsules (1.25 mcg total) by mouth every Monday, Wednesday, and Friday with hemodialysis. 30 capsule 1  . carvedilol (COREG) 6.25 MG tablet Take 1 tablet (6.25 mg total) by mouth 2 (two) times daily for 30 days. 60 tablet 0  . clopidogrel (PLAVIX) 75 MG tablet Take 1 tablet (75 mg total) by mouth daily. 30 tablet 6  . Darbepoetin Alfa (ARANESP) 200 MCG/0.4ML SOSY injection Inject 0.4 mLs (200 mcg total) into the vein every Monday with hemodialysis. 1.68 mL   . docusate sodium (COLACE) 100 MG capsule Take 1 capsule (100 mg total) by mouth daily. 10 capsule 0  . insulin aspart (NOVOLOG) 100 UNIT/ML FlexPen Inject 3 Units into the skin 3 (three) times daily with meals. 15 mL 11  . Insulin Glargine (LANTUS) 100 UNIT/ML Solostar Pen Inject 14 Units into the skin at bedtime. 15 mL 11   . isosorbide mononitrate (IMDUR) 30 MG 24 hr tablet Take 1 tablet (30 mg total) by mouth daily. 30 tablet 0  . lanthanum (FOSRENOL) 1000 MG chewable tablet Chew 2 tablets (2,000 mg total) by mouth 3 (three) times daily with meals. 180 tablet 0  . multivitamin (RENA-VIT) TABS tablet Take 1 tablet by mouth at bedtime. 30 tablet 0  . pantoprazole (PROTONIX) 40 MG tablet Take 1 tablet (40 mg total) by mouth daily. 30 tablet 0  . Vancomycin (VANCOCIN) 750-5 MG/150ML-% SOLN Continue through 12/16/2018 to be administered with hemodialysis 4000 mL 0  . HYDROcodone-acetaminophen (NORCO) 5-325 MG tablet Take 1 tablet by mouth every 6 (six) hours as needed for moderate pain. 20 tablet 0   No current facility-administered medications for this visit.     REVIEW OF SYSTEMS:  [X]  denotes positive finding, [ ]  denotes negative finding Cardiac  Comments:  Chest pain or chest pressure:    Shortness of breath upon exertion:    Short of breath when lying flat:    Irregular heart rhythm:        Vascular    Pain in calf, thigh, or hip brought on by ambulation:    Pain in feet at night that wakes you up from your sleep:     Blood clot in your veins:    Leg swelling:         Pulmonary    Oxygen at home:    Productive cough:     Wheezing:         Neurologic    Sudden weakness in arms or legs:     Sudden numbness in arms or legs:     Sudden onset of difficulty speaking or slurred speech:    Temporary loss of vision in one eye:     Problems with dizziness:         Gastrointestinal    Blood in stool:     Vomited blood:         Genitourinary    Burning when urinating:     Blood in urine:        Psychiatric    Major depression:         Hematologic    Bleeding problems:    Problems with blood clotting too easily:        Skin    Rashes or ulcers:        Constitutional    Fever or chills:      PHYSICAL EXAM: Vitals:   12/12/18  9937 12/12/18 0902  BP: (!) 209/69 (!) 182/66  Pulse: (!)  57 (!) 56  Resp: 16   Temp: 97.7 F (36.5 C)   TempSrc: Oral   SpO2: 100%   Weight: 156 lb 8.4 oz (71 kg)     Right BKA with staples in place.  Incision is clean dry intact.  No cellulitis no drainage no hematoma.    DATA:   None  Assessment/Plan:  43 year old male with critical limb ischemia of the right lower extremity that previously underwent attempt at limb salvage with atherectomy and toe amputation.  Ultimately require a right TMA that failed to heal.  Most recent underwent BKA on 11/17/2018.  This is healing well and looks great today.  Staples removed in clinic.  Instructed to follow-up PRN with any needs.  He now has bilateral BKA's.  States still having pain in stump that is slowly improving.  Last got morphine PO from ED per patient.  Discussed need to wean off.  D/C'd morphine and gave short course PO Norco.  No additional scripts from [place] and if ongoing pain needs will refer to pain clinic.  [name], MD [facility] Office: [phone] Pager: [phone]

## 2019-01-02 ENCOUNTER — Ambulatory Visit: Payer: Medicaid Other | Admitting: Vascular Surgery

## 2019-01-02 ENCOUNTER — Encounter (HOSPITAL_COMMUNITY): Payer: Medicaid Other

## 2019-01-15 ENCOUNTER — Encounter: Payer: Medicaid Other | Attending: Physical Medicine & Rehabilitation | Admitting: Physical Medicine & Rehabilitation

## 2019-03-15 ENCOUNTER — Emergency Department (HOSPITAL_COMMUNITY)
Admission: EM | Admit: 2019-03-15 | Discharge: 2019-03-15 | Discharge status: Against Medical Advice | Payer: Medicaid Other | Attending: Emergency Medicine | Admitting: Emergency Medicine

## 2019-03-15 ENCOUNTER — Other Ambulatory Visit: Payer: Self-pay

## 2019-03-15 ENCOUNTER — Encounter (HOSPITAL_COMMUNITY): Payer: Self-pay | Admitting: Emergency Medicine

## 2019-03-15 ENCOUNTER — Emergency Department (HOSPITAL_COMMUNITY): Payer: Medicaid Other

## 2019-03-15 DIAGNOSIS — N186 End stage renal disease: Secondary | ICD-10-CM | POA: Diagnosis not present

## 2019-03-15 DIAGNOSIS — Z7982 Long term (current) use of aspirin: Secondary | ICD-10-CM | POA: Insufficient documentation

## 2019-03-15 DIAGNOSIS — Z79899 Other long term (current) drug therapy: Secondary | ICD-10-CM | POA: Diagnosis not present

## 2019-03-15 DIAGNOSIS — Z87891 Personal history of nicotine dependence: Secondary | ICD-10-CM | POA: Diagnosis not present

## 2019-03-15 DIAGNOSIS — I259 Chronic ischemic heart disease, unspecified: Secondary | ICD-10-CM | POA: Insufficient documentation

## 2019-03-15 DIAGNOSIS — R079 Chest pain, unspecified: Secondary | ICD-10-CM

## 2019-03-15 DIAGNOSIS — Z5329 Procedure and treatment not carried out because of patient's decision for other reasons: Secondary | ICD-10-CM | POA: Insufficient documentation

## 2019-03-15 DIAGNOSIS — I5042 Chronic combined systolic (congestive) and diastolic (congestive) heart failure: Secondary | ICD-10-CM | POA: Insufficient documentation

## 2019-03-15 DIAGNOSIS — E1022 Type 1 diabetes mellitus with diabetic chronic kidney disease: Secondary | ICD-10-CM | POA: Insufficient documentation

## 2019-03-15 DIAGNOSIS — I132 Hypertensive heart and chronic kidney disease with heart failure and with stage 5 chronic kidney disease, or end stage renal disease: Secondary | ICD-10-CM | POA: Insufficient documentation

## 2019-03-15 DIAGNOSIS — Z7902 Long term (current) use of antithrombotics/antiplatelets: Secondary | ICD-10-CM | POA: Insufficient documentation

## 2019-03-15 DIAGNOSIS — Z992 Dependence on renal dialysis: Secondary | ICD-10-CM | POA: Diagnosis not present

## 2019-03-15 LAB — CBC WITH DIFFERENTIAL/PLATELET
Abs Immature Granulocytes: 0.02 10*3/uL (ref 0.00–0.07)
Basophils Absolute: 0 10*3/uL (ref 0.0–0.1)
Basophils Relative: 1 %
Eosinophils Absolute: 0.1 10*3/uL (ref 0.0–0.5)
Eosinophils Relative: 2 %
HCT: 46.7 % (ref 39.0–52.0)
Hemoglobin: 15.9 g/dL (ref 13.0–17.0)
Immature Granulocytes: 0 %
Lymphocytes Relative: 35 %
Lymphs Abs: 2 10*3/uL (ref 0.7–4.0)
MCH: 27.9 pg (ref 26.0–34.0)
MCHC: 34 g/dL (ref 30.0–36.0)
MCV: 82.1 fL (ref 80.0–100.0)
Monocytes Absolute: 0.5 10*3/uL (ref 0.1–1.0)
Monocytes Relative: 9 %
Neutro Abs: 3.1 10*3/uL (ref 1.7–7.7)
Neutrophils Relative %: 53 %
Platelets: 225 10*3/uL (ref 150–400)
RBC: 5.69 MIL/uL (ref 4.22–5.81)
RDW: 16.1 % — ABNORMAL HIGH (ref 11.5–15.5)
WBC: 5.9 10*3/uL (ref 4.0–10.5)
nRBC: 0 % (ref 0.0–0.2)

## 2019-03-15 LAB — BASIC METABOLIC PANEL
Anion gap: 21 — ABNORMAL HIGH (ref 5–15)
BUN: 134 mg/dL — ABNORMAL HIGH (ref 6–20)
CO2: 18 mmol/L — ABNORMAL LOW (ref 22–32)
Calcium: 8.2 mg/dL — ABNORMAL LOW (ref 8.9–10.3)
Chloride: 94 mmol/L — ABNORMAL LOW (ref 98–111)
Creatinine, Ser: 17.53 mg/dL — ABNORMAL HIGH (ref 0.61–1.24)
GFR calc Af Amer: 3 mL/min — ABNORMAL LOW (ref >60)
GFR calc non Af Amer: 3 mL/min — ABNORMAL LOW (ref >60)
Glucose, Bld: 135 mg/dL — ABNORMAL HIGH (ref 70–99)
Potassium: 5.2 mmol/L — ABNORMAL HIGH (ref 3.5–5.1)
Sodium: 133 mmol/L — ABNORMAL LOW (ref 135–145)

## 2019-03-15 LAB — TROPONIN I (HIGH SENSITIVITY)
Troponin I (High Sensitivity): 37 ng/L — ABNORMAL HIGH (ref <18)
Troponin I (High Sensitivity): 58 ng/L — ABNORMAL HIGH (ref <18)

## 2019-03-15 MED ORDER — ONDANSETRON HCL 4 MG/2ML IJ SOLN
4.0000 mg | Freq: Once | INTRAMUSCULAR | Status: AC
  Administered 2019-03-15: 4 mg via INTRAVENOUS
  Filled 2019-03-15: qty 2

## 2019-03-15 NOTE — ED Triage Notes (Signed)
Arrived via EMS from home developed chest pain today center of chest. EMS administered aspirin 324 mg and Nitro 2 tabs SL. Pain 5/10 sharp to currently 2/10 sharp without shortness of breath. Has dialysis M/W/F last session was Monday.

## 2019-03-15 NOTE — Discharge Instructions (Signed)
Return to the emergency department for any new or worsening symptoms.  Please follow-up with your primary care doctor in 2 to 5 days.  It is very important that you go to dialysis tomorrow and do not miss this session.

## 2019-03-15 NOTE — ED Provider Notes (Signed)
[facility] EMERGENCY DEPARTMENT Provider Note   CSN: [id number] Arrival date & time: 03/15/19  1259    History   Chief Complaint Chest pain  HPI [name] Pavek Sr. is a 43 y.o. male with past medical history of CAD s/p CABG, ESRD, DM1 dialysis M/W/F, bilateral bka presents to emergency department today with chief complaint of chest pain.  Onset was acute, darting 30 minutes prior to arrival.  Patient states he was in the bathroom because he was feeling nauseous when he started to have chest pain in the center of his chest.  It does not radiate.  He rated the pain 5 out of 10 in severity.  He reports associated diaphoresis and nausea without emesis.   EMS administered 324 mg of aspirin and 2 tabs of SL nitro.  Patient reports pain improved and is currently 2 out of 10 in severity.  Pt reports he missed dialysis yesterday because he did not have a ride. He denies fever, chills, shortness of breath, palpitations, abdominal pain.  Chart review shows coronary bypass surgery (LIMA to LAD, SVG to PDA, SVG to OM, Dr. Roxan Hockey) in March 2019.  Past Medical History:  Diagnosis Date  . Anemia   . Atherosclerosis of lower extremity ([place])   . Blood clot in vein    right calf  . CAD (coronary artery disease)   . Cataracts, bilateral   . Chronic combined systolic and diastolic heart failure ([place])   . Complication of anesthesia   . Depression   . ESRD (end stage renal disease) on dialysis [facility])    "MWF [employer]" (03/08/2017)  . GERD (gastroesophageal reflux disease)   . Heart murmur   . History of blood transfusion    "related to OR"  . Hypertension   . Myocardial infarction ([place])     " light"  . Nonhealing surgical wound    nonviable tissue  . PONV (postoperative nausea and vomiting)   . S/P unilateral BKA (below knee amputation), left ([place])   . Type II diabetes mellitus ([place])   . Wears glasses     Patient Active Problem List   Diagnosis Date Noted   . Acute on chronic anemia   . Unilateral complete BKA, right, initial encounter ([place])   . Diabetes mellitus type 2 in nonobese (HCC)   . Bacteremia   . Postoperative pain   . S/P bilateral below knee amputation ([place]) 11/23/2018  . Non-healing wound of lower extremity 11/17/2018  . MRSA bacteremia 11/04/2018  . Infection of amputation stump, right lower extremity ([place]) 11/04/2018  . Non-healing surgical wound 09/14/2018  . Chest pain, rule out acute myocardial infarction 09/07/2018  . Gangrene of toe of left foot ([place]) 08/05/2018  . 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[place]) 08/05/2018  . Gangrene of foot ([place]) 08/05/2018  . 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 ACS (acute coronary syndrome) ([place]) 10/28/2017  . Flash pulmonary edema ([place]) 06/04/2017  . Hypertensive emergency 06/04/2017  . 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 S/P BKA (below knee amputation) unilateral, left ([place])   . Post-operative pain   . Acute blood loss anemia   . Chronic combined systolic and diastolic CHF (congestive heart failure) ([place])   . PVD (peripheral vascular disease) ([place])   . Coronary artery disease involving native coronary artery of native heart with unstable angina  pectoris ([place])   . Tobacco abuse   . Diabetic foot infection ([place]) 03/08/2017  . Osteomyelitis ([place]) 03/08/2017  . Wound dehiscence   . Dehiscence of amputation stump ([place]) 01/04/2017  . Gangrene of right foot ([place]) 08/02/2016  . Achilles tendon contracture, left 08/02/2016  . Anemia of renal disease 08/16/2015  . Wound infection 08/15/2015  . Diabetic ulcer of right great toe ([place]) 08/15/2015  . Pain in the chest   . Elevated troponin   . Essential hypertension   . ESRD on dialysis ([place]) 08/07/2013  . NSTEMI (non-ST elevated myocardial infarction) ([place]) 05/16/2012  . Murmur 05/16/2012  . Renal  disorder     Past Surgical History:  Procedure Laterality Date  . ABDOMINAL AORTOGRAM N/A 11/02/2016   Procedure: Abdominal Aortogram;  Surgeon: Waynetta Sandy, MD;  Location: [place] CV LAB;  Service: Cardiovascular;  Laterality: N/A;  . ABDOMINAL AORTOGRAM W/LOWER EXTREMITY Right 08/07/2018   Procedure: ABDOMINAL AORTOGRAM W/LOWER EXTREMITY;  Surgeon: [name], MD;  Location: [place] [facility] LAB;  Service: Cardiovascular;  Laterality: Right;  . AMPUTATION Left 09/27/2013   Procedure: LEFT GREAT TOE AMPUTATION;  Surgeon: [name], MD;  Location: [place];  Service: Orthopedics;  Laterality: Left;  . AMPUTATION Right 08/15/2015   Procedure: Right Great Toe Amputation;  Surgeon: [name], MD;  Location: [place];  Service: Orthopedics;  Laterality: Right;  . AMPUTATION Left 11/05/2016   Procedure: TRANSMETATARSAL AMPUTATION LEFT FOOT;  Surgeon: [name], MD;  Location: [place];  Service: Orthopedics;  Laterality: Left;  . AMPUTATION Left 03/11/2017   Procedure: LEFT BELOW KNEE AMPUTATION;  Surgeon: [name], MD;  Location: [place];  Service: Orthopedics;  Laterality: Left;  . AMPUTATION Right 03/11/2017   Procedure: RIGHT 2ND TOE AMPUTATION;  Surgeon: [name], MD;  Location: [place];  Service: Orthopedics;  Laterality: Right;  . AMPUTATION Right 11/17/2018   Procedure: AMPUTATION BELOW KNEE;  Surgeon: [name], MD;  Location: [place];  Service: Vascular;  Laterality: Right;  . AV FISTULA PLACEMENT  left arm  . CORONARY ARTERY BYPASS GRAFT N/A 11/04/2017   Procedure: CORONARY ARTERY BYPASS GRAFTING (CABG) x three, using left internal mammary artery and right    leg greater saphenous vein;  Surgeon: Melrose Nakayama, MD;  Location: [place];  Service: Open Heart Surgery;  Laterality: N/A;  . FASCIOTOMY Right 08/07/2018   Procedure: FOUR COMPARTMENT FASCIOTOMY OF RIGHT LOWER LEG;  Surgeon: Rosetta Posner, MD;  Location: [place];  Service: Vascular;   Laterality: Right;  . FASCIOTOMY CLOSURE Right 08/10/2018   Procedure: FASCIOTOMY CLOSURE RIGHT LOWER EXTREMITY;  Surgeon: [name], MD;  Location: [place];  Service: Vascular;  Laterality: Right;  . HEMATOMA EVACUATION Right 08/07/2018   Procedure: EVACUATION HEMATOMA;  Surgeon: [place]etta Posner, MD;  Location: [place];  Service: Vascular;  Laterality: Right;  . LEFT HEART CATH AND CORONARY ANGIOGRAPHY N/A 10/31/2017   Procedure: LEFT HEART CATH AND CORONARY ANGIOGRAPHY;  Surgeon: Troy Sine, MD;  Location: [place] CV LAB;  Service: Cardiovascular;  Laterality: N/A;  . LEFT HEART CATHETERIZATION WITH CORONARY ANGIOGRAM N/A 09/13/2014   Procedure: LEFT HEART CATHETERIZATION WITH CORONARY ANGIOGRAM;  Surgeon: Sinclair Grooms, MD;  Location: [facility] CATH LAB;  Service: Cardiovascular;  Laterality: N/A;  . LOWER EXTREMITY ANGIOGRAPHY Bilateral 11/02/2016   Procedure: Lower Extremity Angiography;  Surgeon: Waynetta Sandy, MD;  Location: [place] CV LAB;  Service:  Cardiovascular;  Laterality: Bilateral;  . PERIPHERAL VASCULAR ATHERECTOMY Left 11/02/2016   Procedure: Peripheral Vascular Atherectomy;  Surgeon: Waynetta Sandy, MD;  Location: [place] CV LAB;  Service: Cardiovascular;  Laterality: Left;  PERONEAL  . PERIPHERAL VASCULAR ATHERECTOMY Right 08/07/2018   Procedure: PERIPHERAL VASCULAR ATHERECTOMY;  Surgeon: [name], MD;  Location: [place] CV LAB;  Service: Cardiovascular;  Laterality: Right;  Peroneal artery  . PERIPHERAL VASCULAR BALLOON ANGIOPLASTY Right 08/07/2018   Procedure: PERIPHERAL VASCULAR BALLOON ANGIOPLASTY;  Surgeon: [name], MD;  Location: [place] CV LAB;  Service: Cardiovascular;  Laterality: Right;  anterior tibial artery  . STUMP REVISION Left 01/11/2017   Procedure: Revision Left Transmetatarsal Amputation;  Surgeon: [name], MD;  Location: [place];  Service: Orthopedics;  Laterality: Left;  . TEE WITHOUT  CARDIOVERSION N/A 11/04/2017   Procedure: TRANSESOPHAGEAL ECHOCARDIOGRAM (TEE);  Surgeon: Melrose Nakayama, MD;  Location: [place];  Service: Open Heart Surgery;  Laterality: N/A;  . TRANSMETATARSAL AMPUTATION Right 08/10/2018   Procedure: RIGHT FIFTH TOE AMPUTATION;  Surgeon: [name], MD;  Location: [place];  Service: Vascular;  Laterality: Right;  . TRANSMETATARSAL AMPUTATION Right 09/14/2018   Procedure: TRANSMETATARSAL AMPUTATION;  Surgeon: [name], MD;  Location: [place];  Service: Vascular;  Laterality: Right;        Home Medications    Prior to Admission medications   Medication Sig Start Date End Date Taking? Authorizing Provider  acetaminophen (TYLENOL) 325 MG tablet Take 2 tablets (650 mg total) by mouth every 4 (four) hours as needed for headache or mild pain. 09/08/18  Yes Geradine Girt, DO  aspirin EC 81 MG tablet Take 1 tablet (81 mg total) by mouth daily. 02/07/18  Yes Burtis Junes, NP  atorvastatin (LIPITOR) 80 MG tablet Take 1 tablet (80 mg total) by mouth daily at 6 PM. Patient taking differently: Take 80 mg by mouth daily.  11/28/18  Yes Angiulli, Lavon Paganini, PA-C  calcitRIOL (ROCALTROL) 0.25 MCG capsule Take 5 capsules (1.25 mcg total) by mouth every Monday, Wednesday, and Friday with hemodialysis. Patient taking differently: Take 0.25 mcg by mouth every Monday, Wednesday, and Friday with hemodialysis.  11/09/17  Yes Conte, Tessa N, PA-C  carvedilol (COREG) 6.25 MG tablet Take 1 tablet (6.25 mg total) by mouth 2 (two) times daily for 30 days. Patient taking differently: Take 3.125 mg by mouth 2 (two) times daily.  11/28/18 03/15/19 Yes Angiulli, Lavon Paganini, PA-C  clopidogrel (PLAVIX) 75 MG tablet Take 1 tablet (75 mg total) by mouth daily. 11/28/18  Yes Angiulli, Lavon Paganini, PA-C  insulin aspart (NOVOLOG) 100 UNIT/ML FlexPen Inject 3 Units into the skin 3 (three) times daily with meals. Patient taking differently: Inject 5 Units into the skin 3 (three)  times daily with meals.  11/28/18  Yes Angiulli, Lavon Paganini, PA-C  Insulin Glargine (LANTUS) 100 UNIT/ML Solostar Pen Inject 14 Units into the skin at bedtime. Patient taking differently: Inject 13 Units into the skin at bedtime.  11/28/18  Yes Angiulli, Lavon Paganini, PA-C  isosorbide mononitrate (IMDUR) 30 MG 24 hr tablet Take 1 tablet (30 mg total) by mouth daily. 11/28/18  Yes Angiulli, Lavon Paganini, PA-C  lanthanum (FOSRENOL) 1000 MG chewable tablet Chew 2 tablets (2,000 mg total) by mouth 3 (three) times daily with meals. 11/28/18  Yes Angiulli, Lavon Paganini, PA-C  multivitamin (RENA-VIT) TABS tablet Take 1 tablet by mouth at bedtime. 08/17/18  Yes Kayleen Memos, DO  Darbepoetin  Alfa (ARANESP) 200 MCG/0.4ML SOSY injection Inject 0.4 mLs (200 mcg total) into the vein every Monday with hemodialysis. Patient not taking: Reported on 03/15/2019 12/04/18   Angiulli, Lavon Paganini, PA-C    Family History Family History  Problem Relation Age of Onset  . Heart failure Mother   . Hypertension Mother     Social History Social History   Tobacco Use  . Smoking status: Former Smoker    Packs/day: 0.50    Years: 26.00    Pack years: 13.00    Types: Cigarettes    Quit date: 10/25/2018    Years since quitting: 0.3  . Smokeless tobacco: Never Used  Substance Use Topics  . Alcohol use: Not Currently    Comment:  "haven't took a drink in over 5 years"  . Drug use: Yes    Types: Marijuana    Comment: occasional     Allergies   Coconut oil   Review of Systems Review of Systems  Constitutional: Positive for diaphoresis. Negative for chills and fever.  HENT: Negative for congestion, rhinorrhea, sinus pressure and sore throat.   Eyes: Negative for pain and redness.  Respiratory: Negative for cough, shortness of breath and wheezing.   Cardiovascular: Positive for chest pain. Negative for palpitations.  Gastrointestinal: Positive for nausea. Negative for abdominal pain, constipation, diarrhea and vomiting.   Genitourinary: Negative for dysuria.  Musculoskeletal: Negative for arthralgias, back pain, myalgias and neck pain.  Skin: Negative for rash and wound.  Neurological: Negative for dizziness, syncope, weakness, numbness and headaches.  Psychiatric/Behavioral: Negative for confusion.     Physical Exam Updated Vital Signs BP (!) 129/114   Pulse 66   Temp 97.7 F (36.5 C)   Resp 16   Ht 6\' 2"  (1.88 m)   Wt 77 kg   SpO2 97%   BMI 21.80 kg/m   Physical Exam Vitals signs and nursing note reviewed.  Constitutional:      General: He is not in acute distress.    Appearance: He is not ill-appearing.  HENT:     Head: Normocephalic and atraumatic.     Right Ear: Tympanic membrane and external ear normal.     Left Ear: Tympanic membrane and external ear normal.     Nose: Nose normal.     Mouth/Throat:     Mouth: Mucous membranes are moist.     Pharynx: Oropharynx is clear.  Eyes:     General: No scleral icterus.       Right eye: No discharge.        Left eye: No discharge.     Extraocular Movements: Extraocular movements intact.     Conjunctiva/sclera: Conjunctivae normal.     Pupils: Pupils are equal, round, and reactive to light.  Neck:     Musculoskeletal: Normal range of motion.     Vascular: No JVD.  Cardiovascular:     Rate and Rhythm: Normal rate and regular rhythm.     Pulses: Normal pulses.          Radial pulses are 2+ on the right side and 2+ on the left side.     Heart sounds: Normal heart sounds.  Pulmonary:     Comments: Lungs clear to auscultation in all fields. Symmetric chest rise. No wheezing, rales, or rhonchi. Abdominal:     Comments: Abdomen is soft, non-distended, and non-tender in all quadrants. No rigidity, no guarding. No peritoneal signs.  Musculoskeletal: Normal range of motion.     Comments: Bilateral BKA.  Skin:    General: Skin is warm and dry.     Capillary Refill: Capillary refill takes less than 2 seconds.  Neurological:     Mental  Status: He is oriented to person, place, and time.     GCS: GCS eye subscore is 4. GCS verbal subscore is 5. GCS motor subscore is 6.     Comments: Fluent speech, no facial droop.  Psychiatric:        Behavior: Behavior normal.      ED Treatments / Results  Labs (all labs ordered are listed, but only abnormal results are displayed) Labs Reviewed  BASIC METABOLIC PANEL - Abnormal; Notable for the following components:      Result Value   Sodium 133 (*)    Potassium 5.2 (*)    Chloride 94 (*)    CO2 18 (*)    Glucose, Bld 135 (*)    BUN 134 (*)    Creatinine, Ser 17.53 (*)    Calcium 8.2 (*)    GFR calc non Af Amer 3 (*)    GFR calc Af Amer 3 (*)    Anion gap 21 (*)    All other components within normal limits  CBC WITH DIFFERENTIAL/PLATELET - Abnormal; Notable for the following components:   RDW 16.1 (*)    All other components within normal limits  TROPONIN I (HIGH SENSITIVITY) - Abnormal; Notable for the following components:   Troponin I (High Sensitivity) 37 (*)    All other components within normal limits  TROPONIN I (HIGH SENSITIVITY) - Abnormal; Notable for the following components:   Troponin I (High Sensitivity) 58 (*)    All other components within normal limits    EKG EKG Interpretation  Date/Time:  Thursday March 15 2019 13:02:26 EDT Ventricular Rate:  68 PR Interval:    QRS Duration: 107 QT Interval:  453 QTC Calculation: 482 R Axis:   75 Text Interpretation:  Sinus rhythm Nonspecific T abnormalities, lateral leads Borderline prolonged QT interval Confirmed by Veryl Speak [phone]) on 03/15/2019 1:06:59 PM   Radiology Dg Chest 2 View  Result Date: 03/15/2019 CLINICAL DATA:  Mid chest pain and shortness of breath today. EXAM: CHEST - 2 VIEW COMPARISON:  PA and lateral chest 11/03/2018 03/12/2018. FINDINGS: Lungs are clear. Heart size is normal. The patient is status post CABG. Atherosclerosis noted. No pneumothorax or pleural fluid. No acute or focal  bony abnormality. IMPRESSION: No acute disease. Atherosclerosis. Electronically Signed   By: Inge Rise M.D.   On: 03/15/2019 15:19    Procedures Procedures (including critical care time)  Medications Ordered in ED Medications  ondansetron (ZOFRAN) injection 4 mg (4 mg Intravenous Given 03/15/19 1440)     Initial Impression / Assessment and Plan / ED Course  I have reviewed the triage vital signs and the nursing notes.  Pertinent labs & imaging results that were available during my care of the patient were reviewed by me and considered in my medical decision making (see chart for details).  Patient presents to the emergency department with chest pain. Patient nontoxic appearing, in no apparent distress, pt is hypertensive on arrival ar 129/114. Labs reviewed, no leukocytosis, anemia. BMP with potassium of 5.2, creatinine 17.53, he chronically has  Elevated kidney function, bicarb of 18. CXR without infiltrate, effusion, pneumothorax, or fracture/dislocation. Initial troponin of 37, second troponin 58. Given increase will consult cardiology. EKG without ischemic changes.  On reassessment pt is pain free. He is requesting to be discharged home.  I had not yet discussed case with cardiology. Pt is aware and does not want to wait in ED any longer. I had lengthy discussion with him and recommended he stay until I am able to talk to cardiology. He politely refuses and would like to leave AMA.  I have discussed my concerns as pt's  provider and the possibility that this may worsen. I have specifically discussed that without further evaluation I cannot guarantee there is not a life threatening event occuring.  Pt is A&Ox4, his own POA and states understanding of my concerns and the possible consequences.  I have made pt aware that this is an [place] discharge, but they may return at any time for further evaluation and treatment. Findings and plan of care discussed with supervising physician Dr. Stark Jock.   This note was prepared using Dragon voice recognition software and may include unintentional dictation errors due to the inherent limitations of voice recognition software.    Final Clinical Impressions(s) / ED Diagnoses   Final diagnoses:  Chest pain, unspecified type    ED Discharge Orders    None       Flint Melter 03/15/19 2138    Veryl Speak, MD 03/18/19 [phone]

## 2019-03-15 NOTE — ED Notes (Signed)
Transported to radiology 

## 2019-03-20 ENCOUNTER — Telehealth: Payer: Self-pay | Admitting: Cardiovascular Disease

## 2019-03-20 MED ORDER — NITROGLYCERIN 0.4 MG SL SUBL: 0.4000 mg | SUBLINGUAL_TABLET | SUBLINGUAL | 2 refills | Status: DC | PRN

## 2019-03-20 NOTE — Telephone Encounter (Signed)
Pt calling requesting a refill on nitroglycerin. This medication is not on pt's medication list. Would Dr. Johnsie Cancel like to prescribe this medication? Please address

## 2019-03-20 NOTE — Telephone Encounter (Signed)
Can call in SL nitro he has had CABG

## 2019-03-20 NOTE — Telephone Encounter (Signed)
°*  STAT* If patient is at the pharmacy, call can be transferred to refill team.   1. Which medications need to be refilled? (please list name of each medication and dose if known) *Niitroglycerin  2. Which pharmacy/location (including street and city if local pharmacy) is medication to be sent to [place]- they will deliver to him, he is an Amputee  3. Do they need a 30 day or 90 day supply? 1 bottle

## 2019-03-20 NOTE — Telephone Encounter (Signed)
Pt's medication was sent to pt's pharmacy as requested. Confirmation received.  °

## 2019-05-17 ENCOUNTER — Other Ambulatory Visit: Payer: Self-pay | Admitting: Internal Medicine

## 2019-10-22 NOTE — Progress Notes (Unsigned)
{Choose 1 Note Type (Video or Telephone):[phone]}   The patient was identified using 2 identifiers.  Date:  10/22/2019   ID:  [name] Labella Sr., DOB 1976/03/11, MRN [id number]  {Patient Location:[phone]::"Home"} {Provider Location:[phone]::"Home"}  PCP:  Sandi Mariscal, MD  Cardiologist:  Jenkins Rouge, MD *** Electrophysiologist:  None   Evaluation Performed:  {Choose Visit Type:[id number]::"Follow-Up Visit"}  Chief Complaint:  ***  History of Present Illness:    [name] Mahon Sr. is a 44 y.o. male with ***  Last OV 05/2018  He has a hx of CAD. I have not seen him in 2 years. Most recently seen by Dr Kathrynn Speed and PA;s. ESRD on dialysis HTN, CHF , DM, vascular disease with left BKA.  Had CABG with Dr Roxan Hockey 11/04/17 with LIMA to LAD  SVG OM1 and SVG PDA EF by TTE 10/30/17 50-55% Rx with antibiotics for carbuncle on left chest near sternotomy 12/20/17  Diabetes is poorly controlled with A1c in 12 range Tends to be anemic followed by renal and transfused a unit on 04/04/18  In March last year he had chest pain along with fever, echo with EF 50-55% G1DD severe MAC with possible small oscillating density on posterior annulus (consider blood cultures if clinically indicated); mild MR., mild LVH.  Did have + blood cultures   Did receive Rt BKA at that time.   Seen in ER in July with chest pain  Hs troponin 58 and 37 - pt signed out AMA.   Pt had called requesting NTG.    The patient {does/does not:200015} have symptoms concerning for COVID-19 infection (fever, chills, cough, or new shortness of breath).    Past Medical History:  Diagnosis Date  . Anemia   . Atherosclerosis of lower extremity ([place])   . Blood clot in vein    right calf  . CAD (coronary artery disease)   . Cataracts, bilateral   . Chronic combined systolic and diastolic heart failure ([place])   . Complication of anesthesia   . Depression   . ESRD (end stage renal disease) on dialysis [facility])     "MWF [employer]" (03/08/2017)  . GERD (gastroesophageal reflux disease)   . Heart murmur   . History of blood transfusion    "related to OR"  . Hypertension   . Myocardial infarction ([place])     " light"  . Nonhealing surgical wound    nonviable tissue  . PONV (postoperative nausea and vomiting)   . S/P unilateral BKA (below knee amputation), left ([place])   . Type II diabetes mellitus ([place])   . Wears glasses    Past Surgical History:  Procedure Laterality Date  . ABDOMINAL AORTOGRAM N/A 11/02/2016   Procedure: Abdominal Aortogram;  Surgeon: Waynetta Sandy, MD;  Location: [place] [facility];  Service: Cardiovascular;  Laterality: N/A;  . ABDOMINAL AORTOGRAM W/LOWER EXTREMITY Right 08/07/2018   Procedure: ABDOMINAL AORTOGRAM W/LOWER EXTREMITY;  Surgeon: [name], MD;  Location: [place] [facility];  Service: Cardiovascular;  Laterality: Right;  . AMPUTATION Left 09/27/2013   Procedure: LEFT GREAT TOE AMPUTATION;  Surgeon: [name], MD;  Location: [place];  Service: Orthopedics;  Laterality: Left;  . AMPUTATION Right 08/15/2015   Procedure: Right Great Toe Amputation;  Surgeon: [name], MD;  Location: [place];  Service: Orthopedics;  Laterality: Right;  . AMPUTATION Left 11/05/2016   Procedure: TRANSMETATARSAL AMPUTATION LEFT FOOT;  Surgeon: [name], MD;  Location: [place];  Service: Orthopedics;  Laterality: Left;  . AMPUTATION Left 03/11/2017   Procedure: LEFT BELOW KNEE AMPUTATION;  Surgeon: [name], MD;  Location: [place];  Service: Orthopedics;  Laterality: Left;  . AMPUTATION Right 03/11/2017   Procedure: RIGHT 2ND TOE AMPUTATION;  Surgeon: [name], MD;  Location: [place];  Service: Orthopedics;  Laterality: Right;  . AMPUTATION Right 11/17/2018   Procedure: AMPUTATION BELOW KNEE;  Surgeon: [name], MD;  Location: [place];  Service: Vascular;  Laterality: Right;  . AV FISTULA PLACEMENT  left arm  . CORONARY ARTERY BYPASS GRAFT N/A  11/04/2017   Procedure: CORONARY ARTERY BYPASS GRAFTING (CABG) x three, using left internal mammary artery and right    leg greater saphenous vein;  Surgeon: Melrose Nakayama, MD;  Location: [place];  Service: Open Heart Surgery;  Laterality: N/A;  . FASCIOTOMY Right 08/07/2018   Procedure: FOUR COMPARTMENT FASCIOTOMY OF RIGHT LOWER LEG;  Surgeon: Rosetta Posner, MD;  Location: [place];  Service: Vascular;  Laterality: Right;  . FASCIOTOMY CLOSURE Right 08/10/2018   Procedure: FASCIOTOMY CLOSURE RIGHT LOWER EXTREMITY;  Surgeon: [name], MD;  Location: [place];  Service: Vascular;  Laterality: Right;  . HEMATOMA EVACUATION Right 08/07/2018   Procedure: EVACUATION HEMATOMA;  Surgeon: Rosetta Posner, MD;  Location: [place];  Service: Vascular;  Laterality: Right;  . LEFT HEART CATH AND CORONARY ANGIOGRAPHY N/A 10/31/2017   Procedure: LEFT HEART CATH AND CORONARY ANGIOGRAPHY;  Surgeon: Troy Sine, MD;  Location: [place] [facility];  Service: Cardiovascular;  Laterality: N/A;  . LEFT HEART CATHETERIZATION WITH CORONARY ANGIOGRAM N/A 09/13/2014   Procedure: LEFT HEART CATHETERIZATION WITH CORONARY ANGIOGRAM;  Surgeon: Sinclair Grooms, MD;  Location: [place] [facility];  Service: Cardiovascular;  Laterality: N/A;  . LOWER EXTREMITY ANGIOGRAPHY Bilateral 11/02/2016   Procedure: Lower Extremity Angiography;  Surgeon: Waynetta Sandy, MD;  Location: [place] [facility];  Service: Cardiovascular;  Laterality: Bilateral;  . PERIPHERAL VASCULAR ATHERECTOMY Left 11/02/2016   Procedure: Peripheral Vascular Atherectomy;  Surgeon: Waynetta Sandy, MD;  Location: [place] [facility];  Service: Cardiovascular;  Laterality: Left;  PERONEAL  . PERIPHERAL VASCULAR ATHERECTOMY Right 08/07/2018   Procedure: PERIPHERAL VASCULAR ATHERECTOMY;  Surgeon: [name], MD;  Location: [place] [facility];  Service: Cardiovascular;  Laterality: Right;  Peroneal artery  . PERIPHERAL VASCULAR BALLOON  ANGIOPLASTY Right 08/07/2018   Procedure: PERIPHERAL VASCULAR BALLOON ANGIOPLASTY;  Surgeon: [name], MD;  Location: [place] [facility];  Service: Cardiovascular;  Laterality: Right;  anterior tibial artery  . STUMP REVISION Left 01/11/2017   Procedure: Revision Left Transmetatarsal Amputation;  Surgeon: [name], MD;  Location: [place];  Service: Orthopedics;  Laterality: Left;  . TEE WITHOUT CARDIOVERSION N/A 11/04/2017   Procedure: TRANSESOPHAGEAL ECHOCARDIOGRAM (TEE);  Surgeon: Melrose Nakayama, MD;  Location: [place];  Service: Open Heart Surgery;  Laterality: N/A;  . TRANSMETATARSAL AMPUTATION Right 08/10/2018   Procedure: RIGHT FIFTH TOE AMPUTATION;  Surgeon: [name], MD;  Location: [place];  Service: Vascular;  Laterality: Right;  . TRANSMETATARSAL AMPUTATION Right 09/14/2018   Procedure: TRANSMETATARSAL AMPUTATION;  Surgeon: [name], MD;  Location: [place];  Service: Vascular;  Laterality: Right;     No outpatient medications have been marked as taking for the 10/23/19 encounter (Appointment) with Isaiah Serge, NP.     Allergies:   Coconut oil   Social History   Tobacco Use  . Smoking  status: Former Smoker    Packs/day: 0.50    Years: 26.00    Pack years: 13.00    Types: Cigarettes    Quit date: 10/25/2018    Years since quitting: 0.9  . Smokeless tobacco: Never Used  Substance Use Topics  . Alcohol use: Not Currently    Comment:  "haven't took a drink in over 5 years"  . Drug use: Yes    Types: Marijuana    Comment: occasional     Family Hx: The patient's family history includes Heart failure in his mother; Hypertension in his mother.  ROS:   Please see the history of present illness.    *** All other systems reviewed and are negative.   Prior CV studies:   The following studies were reviewed today:  ***  Labs/Other Tests and Data Reviewed:    EKG:  {EKG/Telemetry Strips Reviewed:[phone]}  Recent  Labs: 11/03/2018: ALT 11 03/15/2019: BUN 134; Creatinine, Ser 17.53; Hemoglobin 15.9; Platelets 225; Potassium 5.2; Sodium 133   Recent Lipid Panel Lab Results  Component Value Date/Time   CHOL 171 02/07/2018 09:37 AM   TRIG 249 (H) 02/07/2018 09:37 AM   HDL 38 (L) 02/07/2018 09:37 AM   CHOLHDL 4.5 02/07/2018 09:37 AM   CHOLHDL 5.3 10/29/2017 02:40 AM   LDLCALC 83 02/07/2018 09:37 AM    Wt Readings from Last 3 Encounters:  03/15/19 169 lb 12.1 oz (77 kg)  12/12/18 156 lb 8.4 oz (71 kg)  11/27/18 149 lb 14.6 oz (68 kg)     Objective:    Vital Signs:  There were no vitals taken for this visit.   {[facility] Virtual Exam (Optional):[phone]::"VITAL SIGNS:  reviewed"}  ASSESSMENT & PLAN:    1. ***  COVID-19 Education: The signs and symptoms of COVID-19 were discussed with the patient and how to seek care for testing (follow up with PCP or arrange E-visit).  ***The importance of social distancing was discussed today.  Time:   Today, I have spent *** minutes with the patient with telehealth technology discussing the above problems.     Medication Adjustments/Labs and Tests Ordered: Current medicines are reviewed at length with the patient today.  Concerns regarding medicines are outlined above.   Tests Ordered: No orders of the defined types were placed in this encounter.   Medication Changes: No orders of the defined types were placed in this encounter.   Follow Up:  {F/U Format:[phone]} {follow up:15908}  Signed, Cecilie Kicks, NP  10/22/2019 10:09 PM    [facility]

## 2019-10-23 ENCOUNTER — Telehealth: Payer: Medicaid Other | Admitting: Cardiology

## 2019-10-23 ENCOUNTER — Other Ambulatory Visit: Payer: Self-pay

## 2020-03-10 ENCOUNTER — Emergency Department (HOSPITAL_COMMUNITY): Payer: Medicaid Other

## 2020-03-10 ENCOUNTER — Encounter (HOSPITAL_COMMUNITY): Payer: Self-pay | Admitting: Pharmacy Technician

## 2020-03-10 ENCOUNTER — Emergency Department (HOSPITAL_COMMUNITY)
Admission: EM | Admit: 2020-03-10 | Discharge: 2020-03-10 | Disposition: A | Discharge status: Home / Self Care | Payer: Medicaid Other | Attending: Emergency Medicine | Admitting: Emergency Medicine

## 2020-03-10 ENCOUNTER — Other Ambulatory Visit: Payer: Self-pay

## 2020-03-10 DIAGNOSIS — Z794 Long term (current) use of insulin: Secondary | ICD-10-CM | POA: Diagnosis not present

## 2020-03-10 DIAGNOSIS — I5042 Chronic combined systolic (congestive) and diastolic (congestive) heart failure: Secondary | ICD-10-CM | POA: Insufficient documentation

## 2020-03-10 DIAGNOSIS — Z7982 Long term (current) use of aspirin: Secondary | ICD-10-CM | POA: Insufficient documentation

## 2020-03-10 DIAGNOSIS — I132 Hypertensive heart and chronic kidney disease with heart failure and with stage 5 chronic kidney disease, or end stage renal disease: Secondary | ICD-10-CM | POA: Insufficient documentation

## 2020-03-10 DIAGNOSIS — R0602 Shortness of breath: Secondary | ICD-10-CM | POA: Diagnosis present

## 2020-03-10 DIAGNOSIS — I2510 Atherosclerotic heart disease of native coronary artery without angina pectoris: Secondary | ICD-10-CM | POA: Insufficient documentation

## 2020-03-10 DIAGNOSIS — F1590 Other stimulant use, unspecified, uncomplicated: Secondary | ICD-10-CM | POA: Insufficient documentation

## 2020-03-10 DIAGNOSIS — Z992 Dependence on renal dialysis: Secondary | ICD-10-CM | POA: Diagnosis not present

## 2020-03-10 DIAGNOSIS — K5900 Constipation, unspecified: Secondary | ICD-10-CM | POA: Insufficient documentation

## 2020-03-10 DIAGNOSIS — Z951 Presence of aortocoronary bypass graft: Secondary | ICD-10-CM | POA: Insufficient documentation

## 2020-03-10 DIAGNOSIS — R0789 Other chest pain: Secondary | ICD-10-CM

## 2020-03-10 DIAGNOSIS — E1122 Type 2 diabetes mellitus with diabetic chronic kidney disease: Secondary | ICD-10-CM | POA: Insufficient documentation

## 2020-03-10 DIAGNOSIS — N186 End stage renal disease: Secondary | ICD-10-CM | POA: Diagnosis not present

## 2020-03-10 DIAGNOSIS — Z79899 Other long term (current) drug therapy: Secondary | ICD-10-CM | POA: Diagnosis not present

## 2020-03-10 DIAGNOSIS — Z87891 Personal history of nicotine dependence: Secondary | ICD-10-CM | POA: Diagnosis not present

## 2020-03-10 LAB — COMPREHENSIVE METABOLIC PANEL
ALT: 22 U/L (ref 0–44)
AST: 19 U/L (ref 15–41)
Albumin: 3.4 g/dL — ABNORMAL LOW (ref 3.5–5.0)
Alkaline Phosphatase: 53 U/L (ref 38–126)
Anion gap: 23 — ABNORMAL HIGH (ref 5–15)
BUN: 62 mg/dL — ABNORMAL HIGH (ref 6–20)
CO2: 17 mmol/L — ABNORMAL LOW (ref 22–32)
Calcium: 8.1 mg/dL — ABNORMAL LOW (ref 8.9–10.3)
Chloride: 94 mmol/L — ABNORMAL LOW (ref 98–111)
Creatinine, Ser: 13.04 mg/dL — ABNORMAL HIGH (ref 0.61–1.24)
GFR calc Af Amer: 5 mL/min — ABNORMAL LOW (ref >60)
GFR calc non Af Amer: 4 mL/min — ABNORMAL LOW (ref >60)
Glucose, Bld: 104 mg/dL — ABNORMAL HIGH (ref 70–99)
Potassium: 3.8 mmol/L (ref 3.5–5.1)
Sodium: 134 mmol/L — ABNORMAL LOW (ref 135–145)
Total Bilirubin: 0.7 mg/dL (ref 0.3–1.2)
Total Protein: 7.7 g/dL (ref 6.5–8.1)

## 2020-03-10 LAB — TROPONIN I (HIGH SENSITIVITY)
Troponin I (High Sensitivity): 29 ng/L — ABNORMAL HIGH (ref <18)
Troponin I (High Sensitivity): 28 ng/L — ABNORMAL HIGH (ref <18)

## 2020-03-10 LAB — CBC
HCT: 38.4 % — ABNORMAL LOW (ref 39.0–52.0)
Hemoglobin: 12.5 g/dL — ABNORMAL LOW (ref 13.0–17.0)
MCH: 27 pg (ref 26.0–34.0)
MCHC: 32.6 g/dL (ref 30.0–36.0)
MCV: 82.9 fL (ref 80.0–100.0)
Platelets: 358 10*3/uL (ref 150–400)
RBC: 4.63 MIL/uL (ref 4.22–5.81)
RDW: 16.2 % — ABNORMAL HIGH (ref 11.5–15.5)
WBC: 7.7 10*3/uL (ref 4.0–10.5)
nRBC: 0 % (ref 0.0–0.2)

## 2020-03-10 LAB — LIPASE, BLOOD: Lipase: 67 U/L — ABNORMAL HIGH (ref 11–51)

## 2020-03-10 MED ORDER — HYDROCODONE-ACETAMINOPHEN 5-325 MG PO TABS
1.0000 | ORAL_TABLET | Freq: Once | ORAL | Status: AC
  Administered 2020-03-10: 1 via ORAL
  Filled 2020-03-10: qty 1

## 2020-03-10 MED ORDER — OXYCODONE-ACETAMINOPHEN 5-325 MG PO TABS: 1.0000 | ORAL_TABLET | ORAL | 0 refills | Status: DC | PRN

## 2020-03-10 MED ORDER — ONDANSETRON 4 MG PO TBDP
8.0000 mg | ORAL_TABLET | Freq: Once | ORAL | Status: AC
  Administered 2020-03-10: 8 mg via ORAL
  Filled 2020-03-10: qty 2

## 2020-03-10 MED ORDER — SODIUM CHLORIDE 0.9% FLUSH: 3.0000 mL | Freq: Once | INTRAVENOUS | Status: DC

## 2020-03-10 NOTE — Discharge Instructions (Signed)
The testing today did not show any serious problems.  Your chest pain is likely related to muscles or bones, and can be treated with pain medicine, rest and heat to the affected area.  Since you are having gas symptoms, try using MiraLAX, to help have more regular bowel movements.  Use it 2 or 3 times a day until you feel better then cut back to once a day.  Follow-up for dialysis as soon as possible.

## 2020-03-10 NOTE — ED Notes (Signed)
Patient verbalizes understanding of discharge instructions. Opportunity for questioning and answers were provided. Armband removed by staff, pt discharged from ED.  

## 2020-03-10 NOTE — ED Provider Notes (Signed)
[facility] EMERGENCY DEPARTMENT Provider Note   CSN: [id number] Arrival date & time: 03/10/20  0720     History Chief Complaint  Patient presents with  . Shortness of Breath  . Abdominal Pain    [name] Sangha Sr. is a 44 y.o. male.  HPI Patient presenting for numerous complaints including shortness of breath, nausea, vomiting, abdominal pain.  He is a dialysis patient.  Last dialyzed, 3 days ago.  He states chest pain has been present since yesterday, on and off.  He states it is sharp in nature, and gets better when he rubs it.  The pain is located in the left lower anterior part of the chest.  He has had some nausea vomiting since yesterday, today only dry heaves.  He has decreased frequency of stool in the last 3 days.  He thinks he might be constipated.  He denies fever, cough, dizziness or new weakness.  He is a bilateral amputee and typically spends most of his time in a wheelchair.  There are no other known modifying factors.    Past Medical History:  Diagnosis Date  . Anemia   . Atherosclerosis of lower extremity ([place])   . Blood clot in vein    right calf  . CAD (coronary artery disease)   . Cataracts, bilateral   . Chronic combined systolic and diastolic heart failure ([place])   . Complication of anesthesia   . Depression   . ESRD (end stage renal disease) on dialysis [facility])    "MWF [employer]" (03/08/2017)  . GERD (gastroesophageal reflux disease)   . Heart murmur   . History of blood transfusion    "related to OR"  . Hypertension   . Myocardial infarction ([place])     " light"  . Nonhealing surgical wound    nonviable tissue  . PONV (postoperative nausea and vomiting)   . S/P unilateral BKA (below knee amputation), left ([place])   . Type II diabetes mellitus ([place])   . Wears glasses     Patient Active Problem List   Diagnosis Date Noted  . Acute on chronic anemia   . Unilateral complete BKA, right, initial encounter ([place])   . Diabetes  mellitus type 2 in nonobese (HCC)   . Bacteremia   . Postoperative pain   . S/P bilateral below knee amputation ([place]) 11/23/2018  . Non-healing wound of lower extremity 11/17/2018  . MRSA bacteremia 11/04/2018  . Infection of amputation stump, right lower extremity ([place]) 11/04/2018  . Non-healing surgical wound 09/14/2018  . Chest pain, rule out acute myocardial infarction 09/07/2018  . Gangrene of toe of left foot ([place]) 08/05/2018  . 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) 08/05/2018  . Gangrene of foot ([place]) 08/05/2018  . 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 ACS (acute coronary syndrome) ([place]) 10/28/2017  . Flash pulmonary edema ([place]) 06/04/2017  . Hypertensive emergency 06/04/2017  . 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 S/P BKA (below knee amputation) unilateral, left ([place])   . Post-operative pain   . Acute blood loss anemia   . Chronic combined systolic and diastolic CHF (congestive heart failure) ([place])   . PVD (peripheral vascular disease) ([place])   . Coronary artery disease involving native coronary artery of native heart with unstable angina pectoris ([place])   . Tobacco abuse   . Diabetic foot  infection ([place]) 03/08/2017  . Osteomyelitis ([place]) 03/08/2017  . Wound dehiscence   . Dehiscence of amputation stump ([place]) 01/04/2017  . Gangrene of right foot ([place]) 08/02/2016  . Achilles tendon contracture, left 08/02/2016  . Anemia of renal disease 08/16/2015  . Wound infection 08/15/2015  . Diabetic ulcer of right great toe ([place]) 08/15/2015  . Pain in the chest   . Elevated troponin   . Essential hypertension   . ESRD on dialysis ([place]) 08/07/2013  . NSTEMI (non-ST elevated myocardial infarction) ([place]) 05/16/2012  . Murmur 05/16/2012  . Renal disorder     Past Surgical History:  Procedure Laterality Date  . ABDOMINAL AORTOGRAM N/A  11/02/2016   Procedure: Abdominal Aortogram;  Surgeon: Waynetta Sandy, MD;  Location: [facility] CV LAB;  Service: Cardiovascular;  Laterality: N/A;  . ABDOMINAL AORTOGRAM W/LOWER EXTREMITY Right 08/07/2018   Procedure: ABDOMINAL AORTOGRAM W/LOWER EXTREMITY;  Surgeon: [name], MD;  Location: [facility] CV LAB;  Service: Cardiovascular;  Laterality: Right;  . AMPUTATION Left 09/27/2013   Procedure: LEFT GREAT TOE AMPUTATION;  Surgeon: [name], MD;  Location: [facility];  Service: Orthopedics;  Laterality: Left;  . AMPUTATION Right 08/15/2015   Procedure: Right Great Toe Amputation;  Surgeon: [name], MD;  Location: [facility];  Service: Orthopedics;  Laterality: Right;  . AMPUTATION Left 11/05/2016   Procedure: TRANSMETATARSAL AMPUTATION LEFT FOOT;  Surgeon: [name], MD;  Location: [facility];  Service: Orthopedics;  Laterality: Left;  . AMPUTATION Left 03/11/2017   Procedure: LEFT BELOW KNEE AMPUTATION;  Surgeon: [name], MD;  Location: [facility];  Service: Orthopedics;  Laterality: Left;  . AMPUTATION Right 03/11/2017   Procedure: RIGHT 2ND TOE AMPUTATION;  Surgeon: [name], MD;  Location: [facility];  Service: Orthopedics;  Laterality: Right;  . AMPUTATION Right 11/17/2018   Procedure: AMPUTATION BELOW KNEE;  Surgeon: [name], MD;  Location: [facility];  Service: Vascular;  Laterality: Right;  . AV FISTULA PLACEMENT  left arm  . CORONARY ARTERY BYPASS GRAFT N/A 11/04/2017   Procedure: CORONARY ARTERY BYPASS GRAFTING (CABG) x three, using left internal mammary artery and right    leg greater saphenous vein;  Surgeon: Melrose Nakayama, MD;  Location: [facility];  Service: Open Heart Surgery;  Laterality: N/A;  . FASCIOTOMY Right 08/07/2018   Procedure: FOUR COMPARTMENT FASCIOTOMY OF RIGHT LOWER LEG;  Surgeon: Rosetta Posner, MD;  Location: [facility];  Service: Vascular;  Laterality: Right;  . FASCIOTOMY CLOSURE Right 08/10/2018   Procedure: FASCIOTOMY CLOSURE RIGHT  LOWER EXTREMITY;  Surgeon: [name], MD;  Location: [facility];  Service: Vascular;  Laterality: Right;  . HEMATOMA EVACUATION Right 08/07/2018   Procedure: EVACUATION HEMATOMA;  Surgeon: Rosetta Posner, MD;  Location: [facility];  Service: Vascular;  Laterality: Right;  . LEFT HEART CATH AND CORONARY ANGIOGRAPHY N/A 10/31/2017   Procedure: LEFT HEART CATH AND CORONARY ANGIOGRAPHY;  Surgeon: Troy Sine, MD;  Location: [facility] CV LAB;  Service: Cardiovascular;  Laterality: N/A;  . LEFT HEART CATHETERIZATION WITH CORONARY ANGIOGRAM N/A 09/13/2014   Procedure: LEFT HEART CATHETERIZATION WITH CORONARY ANGIOGRAM;  Surgeon: Sinclair Grooms, MD;  Location: [facility] CATH LAB;  Service: Cardiovascular;  Laterality: N/A;  . LOWER EXTREMITY ANGIOGRAPHY Bilateral 11/02/2016   Procedure: Lower Extremity Angiography;  Surgeon: Waynetta Sandy, MD;  Location: [facility] CV LAB;  Service: Cardiovascular;  Laterality: Bilateral;  . PERIPHERAL VASCULAR ATHERECTOMY Left 11/02/2016  Procedure: Peripheral Vascular Atherectomy;  Surgeon: Waynetta Sandy, MD;  Location: [facility] CV LAB;  Service: Cardiovascular;  Laterality: Left;  PERONEAL  . PERIPHERAL VASCULAR ATHERECTOMY Right 08/07/2018   Procedure: PERIPHERAL VASCULAR ATHERECTOMY;  Surgeon: [name], MD;  Location: [facility] CV LAB;  Service: Cardiovascular;  Laterality: Right;  Peroneal artery  . PERIPHERAL VASCULAR BALLOON ANGIOPLASTY Right 08/07/2018   Procedure: PERIPHERAL VASCULAR BALLOON ANGIOPLASTY;  Surgeon: [name], MD;  Location: [facility] LAB;  Service: Cardiovascular;  Laterality: Right;  anterior tibial artery  . STUMP REVISION Left 01/11/2017   Procedure: Revision Left Transmetatarsal Amputation;  Surgeon: [name], MD;  Location: [facility];  Service: Orthopedics;  Laterality: Left;  . TEE WITHOUT CARDIOVERSION N/A 11/04/2017   Procedure: TRANSESOPHAGEAL ECHOCARDIOGRAM (TEE);  Surgeon:  Melrose Nakayama, MD;  Location: [facility];  Service: Open Heart Surgery;  Laterality: N/A;  . TRANSMETATARSAL AMPUTATION Right 08/10/2018   Procedure: RIGHT FIFTH TOE AMPUTATION;  Surgeon: [name], MD;  Location: [facility];  Service: Vascular;  Laterality: Right;  . TRANSMETATARSAL AMPUTATION Right 09/14/2018   Procedure: TRANSMETATARSAL AMPUTATION;  Surgeon: [name], MD;  Location: [facility] OR;  Service: Vascular;  Laterality: Right;       Family History  Problem Relation Age of Onset  . Heart failure Mother   . Hypertension Mother     Social History   Tobacco Use  . Smoking status: Former Smoker    Packs/day: 0.50    Years: 26.00    Pack years: 13.00    Types: Cigarettes    Quit date: 10/25/2018    Years since quitting: 1.3  . Smokeless tobacco: Never Used  Vaping Use  . Vaping Use: Never used  Substance Use Topics  . Alcohol use: Not Currently    Comment:  "haven't took a drink in over 5 years"  . Drug use: Yes    Types: Marijuana    Comment: occasional    Home Medications Prior to Admission medications   Medication Sig Start Date End Date Taking? Authorizing Provider  acetaminophen (TYLENOL) 325 MG tablet Take 2 tablets (650 mg total) by mouth every 4 (four) hours as needed for headache or mild pain. 09/08/18  Yes Geradine Girt, DO  atorvastatin (LIPITOR) 80 MG tablet Take 1 tablet (80 mg total) by mouth daily at 6 PM. 11/28/18  Yes Angiulli, Lavon Paganini, PA-C  calcitRIOL (ROCALTROL) 0.25 MCG capsule Take 5 capsules (1.25 mcg total) by mouth every Monday, Wednesday, and Friday with hemodialysis. 11/09/17  Yes Conte, Tessa N, PA-C  carvedilol (COREG) 6.25 MG tablet Take 1 tablet (6.25 mg total) by mouth 2 (two) times daily for 30 days. 11/28/18  Yes Angiulli, Lavon Paganini, PA-C  clopidogrel (PLAVIX) 75 MG tablet Take 1 tablet (75 mg total) by mouth daily. 11/28/18  Yes Angiulli, Lavon Paganini, PA-C  Darbepoetin Alfa (ARANESP) 200 MCG/0.4ML SOSY injection Inject 0.4 mLs  (200 mcg total) into the vein every Monday with hemodialysis. 12/04/18  Yes Angiulli, Lavon Paganini, PA-C  esomeprazole (NEXIUM) 40 MG capsule Take 40 mg by mouth daily. 01/17/20  Yes [provider]  isosorbide mononitrate (IMDUR) 30 MG 24 hr tablet Take 1 tablet (30 mg total) by mouth daily. 11/28/18  Yes Angiulli, Lavon Paganini, PA-C  lanthanum (FOSRENOL) 1000 MG chewable tablet Chew 2 tablets (2,000 mg total) by mouth 3 (three) times daily with meals. 11/28/18  Yes Angiulli, Lavon Paganini, PA-C  multivitamin (RENA-VIT) TABS tablet Take  1 tablet by mouth at bedtime. 08/17/18  Yes Kayleen Memos, DO  aspirin EC 81 MG tablet Take 1 tablet (81 mg total) by mouth daily. Patient not taking: Reported on 03/10/2020 02/07/18   Burtis Junes, NP  insulin aspart (NOVOLOG) 100 UNIT/ML FlexPen Inject 3 Units into the skin 3 (three) times daily with meals. Patient not taking: Reported on 03/10/2020 11/28/18   Angiulli, Lavon Paganini, PA-C  Insulin Glargine (LANTUS) 100 UNIT/ML Solostar Pen Inject 14 Units into the skin at bedtime. Patient not taking: Reported on 03/10/2020 11/28/18   Angiulli, Lavon Paganini, PA-C  nitroGLYCERIN (NITROSTAT) 0.4 MG SL tablet Place 1 tablet (0.4 mg total) under the tongue every 5 (five) minutes as needed for chest pain. 03/20/19 06/18/19  Josue Hector, MD  oxyCODONE-acetaminophen (PERCOCET) 5-325 MG tablet Take 1 tablet by mouth every 4 (four) hours as needed for severe pain. 03/10/20   Daleen Bo, MD    Allergies    Coconut oil  Review of Systems   Review of Systems  All other systems reviewed and are negative.   Physical Exam Updated Vital Signs BP (!) 176/135   Pulse 70   Temp 97.6 F (36.4 C) (Oral)   Resp 22   SpO2 98%   Physical Exam Vitals and nursing note reviewed.  Constitutional:      General: He is not in acute distress.    Appearance: He is well-developed. He is not ill-appearing, toxic-appearing or diaphoretic.  HENT:     Head: Normocephalic and atraumatic.     Right  Ear: External ear normal.     Left Ear: External ear normal.  Eyes:     Conjunctiva/sclera: Conjunctivae normal.     Pupils: Pupils are equal, round, and reactive to light.  Neck:     Trachea: Phonation normal.  Cardiovascular:     Rate and Rhythm: Normal rate and regular rhythm.     Heart sounds: Normal heart sounds.  Pulmonary:     Effort: Pulmonary effort is normal. No respiratory distress.     Breath sounds: Normal breath sounds. No stridor. No rhonchi.  Abdominal:     Palpations: Abdomen is soft.  Musculoskeletal:        General: Normal range of motion.     Cervical back: Normal range of motion and neck supple.  Skin:    General: Skin is warm and dry.  Neurological:     Mental Status: He is alert and oriented to person, place, and time.     Cranial Nerves: No cranial nerve deficit.     Sensory: No sensory deficit.     Motor: No abnormal muscle tone.     Coordination: Coordination normal.  Psychiatric:        Mood and Affect: Mood normal.        Behavior: Behavior normal.        Thought Content: Thought content normal.        Judgment: Judgment normal.     ED Results / Procedures / Treatments   Labs (all labs ordered are listed, but only abnormal results are displayed) Labs Reviewed  LIPASE, BLOOD - Abnormal; Notable for the following components:      Result Value   Lipase 67 (*)    All other components within normal limits  COMPREHENSIVE METABOLIC PANEL - Abnormal; Notable for the following components:   Sodium 134 (*)    Chloride 94 (*)    CO2 17 (*)    Glucose, Bld 104 (*)  BUN 62 (*)    Creatinine, Ser 13.04 (*)    Calcium 8.1 (*)    Albumin 3.4 (*)    GFR calc non Af Amer 4 (*)    GFR calc Af Amer 5 (*)    Anion gap 23 (*)    All other components within normal limits  CBC - Abnormal; Notable for the following components:   Hemoglobin 12.5 (*)    HCT 38.4 (*)    RDW 16.2 (*)    All other components within normal limits  TROPONIN I (HIGH  SENSITIVITY) - Abnormal; Notable for the following components:   Troponin I (High Sensitivity) 29 (*)    All other components within normal limits  TROPONIN I (HIGH SENSITIVITY) - Abnormal; Notable for the following components:   Troponin I (High Sensitivity) 28 (*)    All other components within normal limits    EKG EKG Interpretation  Date/Time:  Monday March 10 2020 07:26:02 EDT Ventricular Rate:  72 PR Interval:  166 QRS Duration: 94 QT Interval:  480 QTC Calculation: 525 R Axis:   22 Text Interpretation: Normal sinus rhythm Abnormal QRS-T angle, consider primary T wave abnormality Prolonged QT Abnormal ECG Since last tracing QT has lengthened Otherwise no significant change Confirmed by Daleen Bo [phone]) on 03/10/2020 9:41:05 AM   Radiology DG Chest 2 View  Result Date: 03/10/2020 CLINICAL DATA:  Shortness of breath EXAM: CHEST - 2 VIEW COMPARISON:  March 15, 2019 FINDINGS: Lungs are clear. Heart size and pulmonary vascularity are normal. No adenopathy. Patient is status post coronary artery bypass grafting. There is aortic atherosclerosis. No bone lesions. IMPRESSION: Status post coronary artery bypass grafting. Heart size normal. Lungs clear. Aortic Atherosclerosis (ICD10-I70.0). Electronically Signed   By: Lowella Grip III M.D.   On: 03/10/2020 08:01   US Abdomen Complete  Result Date: 03/10/2020 CLINICAL DATA:  Abdominal pain with nausea and vomiting. Chronic renal failure. EXAM: ABDOMEN ULTRASOUND COMPLETE COMPARISON:  None. FINDINGS: Gallbladder: No gallstones or wall thickening visualized. There is no pericholecystic fluid. No sonographic Murphy sign noted by sonographer. Common bile duct: Diameter: 5 mm. No intrahepatic, common hepatic, or common bile duct dilatation. Liver: No focal lesion identified. Within normal limits in parenchymal echogenicity. Portal vein is patent on color Doppler imaging with normal direction of blood flow towards the liver. IVC: No  abnormality visualized. Pancreas: No pancreatic mass or inflammatory focus. Spleen: Size and appearance within normal limits. Right Kidney: Length: 8.8 cm. Echogenicity is increased. There is renal cortical thinning. No mass or hydronephrosis visualized. Left Kidney: Length: 7.0 cm. Echogenicity is increased. There is renal cortical thinning. No hydronephrosis visualized. There is a cyst in the upper pole left kidney region measuring 2.3 x 1.9 x 2.0 cm. A second cyst in the mid left kidney measures 1.2 x 1.4 x 1.0 cm. Abdominal aorta: No aneurysm visualized. Other findings: No evident ascites. IMPRESSION: Kidneys are small with overall increased echogenicity and renal cortical thinning consistent with medical renal disease. There are simple cysts in the left kidney. Study otherwise unremarkable. Electronically Signed   By: Lowella Grip III M.D.   On: 03/10/2020 12:01    Procedures Procedures (including critical care time)  Medications Ordered in ED Medications  sodium chloride flush (NS) 0.9 % injection 3 mL (has no administration in time range)  HYDROcodone-acetaminophen (NORCO/VICODIN) 5-325 MG per tablet 1 tablet (has no administration in time range)  HYDROcodone-acetaminophen (NORCO/VICODIN) 5-325 MG per tablet 1 tablet (1 tablet Oral Given 03/10/20  1002)  ondansetron (ZOFRAN-ODT) disintegrating tablet 8 mg (8 mg Oral Given 03/10/20 1000)    ED Course  I have reviewed the triage vital signs and the nursing notes.  Pertinent labs & imaging results that were available during my care of the patient were reviewed by me and considered in my medical decision making (see chart for details).  Clinical Course as of Mar 10 1254  Mon Mar 10, 2020  2202 Abnormal, high  Troponin I (High Sensitivity)(!) [EW]  [id number] Normal except sodium low, chloride low, CO2 low, glucose high, BUN high, creatinine high, calcium low, albumin low, GFR low, anion gap high  Comprehensive metabolic panel(!) [EW]  0623  Abnormal, slightly elevated  Lipase, blood(!) [EW]  0939 Normal except hemoglobin low  CBC(!) [EW]  0939 No infiltrate or edema, interpreted by me  DG Chest 2 View [EW]  1025 He is resting comfortably after narcotic pain reliever.  Patient's fianc wants him to be evaluated for gallbladder disease.  She is also concerned about his pancreas.  Lipase is minimally elevated.  Abdominal ultrasound ordered.  Patient's blood pressure is high, he has not taken his morning medications yet and he will be given them by his fiance.   [EW]  1246 Per radiologist, no acute abnormalities.  Kidneys are small, related to end-stage renal disease.  US Abdomen Complete [EW]    Clinical Course User Index [EW] Daleen Bo, MD   MDM Rules/Calculators/A&P                           Patient Vitals for the past 24 hrs:  BP Temp Temp src Pulse Resp SpO2  03/10/20 0728 (!) 176/135 97.6 F (36.4 C) Oral 70 22 98 %    12:55 PM Reevaluation with update and discussion. After initial assessment and treatment, an updated evaluation reveals he is sitting up now and states he feels like "I have gas.  He states it typically takes MiraLAX for this type of pain.  He feels he is probably constipated.  Findings discussed and questions answered. Daleen Bo   Medical Decision Making:  This patient is presenting for evaluation of nausea vomiting and chest wall pain, which does require a range of treatment options, and is a complaint that involves a moderate risk of morbidity and mortality. The differential diagnoses include ACS, complications of end-stage renal disease, musculoskeletal pain, food poisoning, gastroenteritis. I decided to review old records, and in summary patient with end-stage renal disease, CABG, 2 years ago, presenting with pain atypical for coronary syndrome.  Nonspecific nausea and vomiting with bowel irregularities possibly constipation..  I obtained additional historical information from his fiance at  the bedside..  Clinical Laboratory Tests Ordered, included CBC, Metabolic panel and Delta troponin, lipase. Review indicates 5 delta troponin, mildly elevated.  Hemoglobin low, creatinine high, BUN high, calcium low, CO2 low, chloride low likely slightly elevated. Radiologic Tests Ordered, included chest x-ray.  I independently Visualized: Radiographic images, which show no infiltrate or edema  Cardiac Monitor Tracing which shows normal sinus rhythm    Critical Interventions-clinical evaluation, laboratory testing, EKG, delta troponin, medication treatment, reevaluation after period of observation.  After These Interventions, the Patient was reevaluated and was found stable for discharge.  Nonspecific chest pain most likely chest wall pain.  Nonspecific nausea and vomiting, improved after treatment.  CRITICAL CARE-no Performed by: Daleen Bo  Nursing Notes Reviewed/ Care Coordinated Applicable Imaging Reviewed Interpretation of Laboratory Data incorporated into ED  treatment  The patient appears reasonably screened and/or stabilized for discharge and I doubt any other medical condition or other [facility] requiring further screening, evaluation, or treatment in the ED at this time prior to discharge.  Plan: Home Medications-continue usual home medications and use MiraLAX to 3 times a day until having regular stools.; Home Treatments-increase fiber in diet drink plenty of fluids; return here if the recommended treatment, does not improve the symptoms; Recommended follow up-PCP, as needed.  Dialysis soon as possible.  Will call today to arrange for dialysis tomorrow.     Final Clinical Impression(s) / ED Diagnoses Final diagnoses:  Chest wall pain  ESRD (end stage renal disease) (HCC)  Constipation, unspecified constipation type    Rx / DC Orders ED Discharge Orders         Ordered    oxyCODONE-acetaminophen (PERCOCET) 5-325 MG tablet  Every 4 hours PRN     Discontinue  Reprint      03/10/20 1255           Daleen Bo, MD 03/10/20 1302

## 2020-03-10 NOTE — ED Notes (Addendum)
EDP gave pt SO permission to give him morning meds. EDP aware of elevated b.

## 2020-03-10 NOTE — ED Notes (Addendum)
[place]  would like a call when pt is back in a room [phone]

## 2020-03-10 NOTE — ED Notes (Signed)
Pt no longer produces urine.

## 2020-03-10 NOTE — ED Notes (Signed)
Pt reports being anuric

## 2020-03-10 NOTE — ED Triage Notes (Signed)
Pt in POV w/sob, n/v since last night. Also reporting LUQ pain and R sided cp. Has dialysis MWF, last done Friday. Hx of CABG, DM. Daughter states sugars have been high lately.

## 2020-03-10 NOTE — ED Notes (Signed)
Notified EDP of pt/family concerns.

## 2020-03-10 NOTE — ED Notes (Signed)
Patient transported to Ultrasound 

## 2020-06-23 ENCOUNTER — Emergency Department (HOSPITAL_COMMUNITY): Payer: Medicaid Other

## 2020-06-23 ENCOUNTER — Other Ambulatory Visit: Payer: Self-pay

## 2020-06-23 ENCOUNTER — Observation Stay (HOSPITAL_COMMUNITY)
Admission: EM | Admit: 2020-06-23 | Discharge: 2020-06-24 | Disposition: A | Discharge status: Home / Self Care | Payer: Medicaid Other | Attending: Internal Medicine | Admitting: Internal Medicine

## 2020-06-23 DIAGNOSIS — Z20822 Contact with and (suspected) exposure to covid-19: Secondary | ICD-10-CM | POA: Diagnosis not present

## 2020-06-23 DIAGNOSIS — Z992 Dependence on renal dialysis: Secondary | ICD-10-CM | POA: Insufficient documentation

## 2020-06-23 DIAGNOSIS — I25110 Atherosclerotic heart disease of native coronary artery with unstable angina pectoris: Secondary | ICD-10-CM | POA: Insufficient documentation

## 2020-06-23 DIAGNOSIS — Z7982 Long term (current) use of aspirin: Secondary | ICD-10-CM | POA: Diagnosis not present

## 2020-06-23 DIAGNOSIS — R079 Chest pain, unspecified: Secondary | ICD-10-CM | POA: Diagnosis not present

## 2020-06-23 DIAGNOSIS — E875 Hyperkalemia: Secondary | ICD-10-CM | POA: Insufficient documentation

## 2020-06-23 DIAGNOSIS — E1122 Type 2 diabetes mellitus with diabetic chronic kidney disease: Secondary | ICD-10-CM | POA: Diagnosis not present

## 2020-06-23 DIAGNOSIS — Z79899 Other long term (current) drug therapy: Secondary | ICD-10-CM | POA: Insufficient documentation

## 2020-06-23 DIAGNOSIS — Z794 Long term (current) use of insulin: Secondary | ICD-10-CM | POA: Insufficient documentation

## 2020-06-23 DIAGNOSIS — I10 Essential (primary) hypertension: Secondary | ICD-10-CM | POA: Diagnosis not present

## 2020-06-23 DIAGNOSIS — I132 Hypertensive heart and chronic kidney disease with heart failure and with stage 5 chronic kidney disease, or end stage renal disease: Secondary | ICD-10-CM | POA: Diagnosis not present

## 2020-06-23 DIAGNOSIS — I5042 Chronic combined systolic (congestive) and diastolic (congestive) heart failure: Secondary | ICD-10-CM | POA: Insufficient documentation

## 2020-06-23 DIAGNOSIS — N186 End stage renal disease: Secondary | ICD-10-CM | POA: Diagnosis not present

## 2020-06-23 DIAGNOSIS — Z87891 Personal history of nicotine dependence: Secondary | ICD-10-CM | POA: Diagnosis not present

## 2020-06-23 LAB — CBG MONITORING, ED
Glucose-Capillary: 195 mg/dL — ABNORMAL HIGH (ref 70–99)
Glucose-Capillary: 120 mg/dL — ABNORMAL HIGH (ref 70–99)

## 2020-06-23 LAB — CBC WITH DIFFERENTIAL/PLATELET
Abs Immature Granulocytes: 0.02 10*3/uL (ref 0.00–0.07)
Basophils Absolute: 0 10*3/uL (ref 0.0–0.1)
Basophils Relative: 0 %
Eosinophils Absolute: 0.4 10*3/uL (ref 0.0–0.5)
Eosinophils Relative: 6 %
HCT: 38.8 % — ABNORMAL LOW (ref 39.0–52.0)
Hemoglobin: 12.3 g/dL — ABNORMAL LOW (ref 13.0–17.0)
Immature Granulocytes: 0 %
Lymphocytes Relative: 19 %
Lymphs Abs: 1.5 10*3/uL (ref 0.7–4.0)
MCH: 28.6 pg (ref 26.0–34.0)
MCHC: 31.7 g/dL (ref 30.0–36.0)
MCV: 90.2 fL (ref 80.0–100.0)
Monocytes Absolute: 0.5 10*3/uL (ref 0.1–1.0)
Monocytes Relative: 7 %
Neutro Abs: 5.5 10*3/uL (ref 1.7–7.7)
Neutrophils Relative %: 68 %
Platelets: 363 10*3/uL (ref 150–400)
RBC: 4.3 MIL/uL (ref 4.22–5.81)
RDW: 17 % — ABNORMAL HIGH (ref 11.5–15.5)
WBC: 8 10*3/uL (ref 4.0–10.5)
nRBC: 0 % (ref 0.0–0.2)

## 2020-06-23 LAB — COMPREHENSIVE METABOLIC PANEL
ALT: 22 U/L (ref 0–44)
AST: 29 U/L (ref 15–41)
Albumin: 3.7 g/dL (ref 3.5–5.0)
Alkaline Phosphatase: 45 U/L (ref 38–126)
Anion gap: 21 — ABNORMAL HIGH (ref 5–15)
BUN: 83 mg/dL — ABNORMAL HIGH (ref 6–20)
CO2: 9 mmol/L — ABNORMAL LOW (ref 22–32)
Calcium: 8.7 mg/dL — ABNORMAL LOW (ref 8.9–10.3)
Chloride: 103 mmol/L (ref 98–111)
Creatinine, Ser: 20.55 mg/dL — ABNORMAL HIGH (ref 0.61–1.24)
GFR, Estimated: 3 mL/min — ABNORMAL LOW (ref >60)
Glucose, Bld: 176 mg/dL — ABNORMAL HIGH (ref 70–99)
Potassium: 5.2 mmol/L — ABNORMAL HIGH (ref 3.5–5.1)
Sodium: 133 mmol/L — ABNORMAL LOW (ref 135–145)
Total Bilirubin: 1 mg/dL (ref 0.3–1.2)
Total Protein: 7.7 g/dL (ref 6.5–8.1)

## 2020-06-23 LAB — RESP PANEL BY RT PCR (RSV, FLU A&B, COVID)
Influenza A by PCR: NEGATIVE
Influenza B by PCR: NEGATIVE
Respiratory Syncytial Virus by PCR: NEGATIVE
SARS Coronavirus 2 by RT PCR: NEGATIVE

## 2020-06-23 LAB — HEMOGLOBIN A1C
Hgb A1c MFr Bld: 8.2 % — ABNORMAL HIGH (ref 4.8–5.6)
Mean Plasma Glucose: 188.64 mg/dL

## 2020-06-23 LAB — TROPONIN I (HIGH SENSITIVITY)
Troponin I (High Sensitivity): 64 ng/L — ABNORMAL HIGH (ref <18)
Troponin I (High Sensitivity): 71 ng/L — ABNORMAL HIGH (ref <18)

## 2020-06-23 LAB — HIV ANTIBODY (ROUTINE TESTING W REFLEX): HIV Screen 4th Generation wRfx: NONREACTIVE

## 2020-06-23 MED ORDER — ACETAMINOPHEN 325 MG PO TABS: 650.0000 mg | ORAL_TABLET | ORAL | Status: DC | PRN

## 2020-06-23 MED ORDER — ONDANSETRON HCL 4 MG/2ML IJ SOLN: 4.0000 mg | Freq: Four times a day (QID) | INTRAMUSCULAR | Status: DC | PRN

## 2020-06-23 MED ORDER — SODIUM CHLORIDE 0.9 % IV BOLUS
1000.0000 mL | Freq: Once | INTRAVENOUS | Status: AC
  Administered 2020-06-23: 1000 mL via INTRAVENOUS

## 2020-06-23 MED ORDER — HEPARIN SODIUM (PORCINE) 5000 UNIT/ML IJ SOLN
5000.0000 [IU] | Freq: Two times a day (BID) | INTRAMUSCULAR | Status: DC
  Administered 2020-06-23: 5000 [IU] via SUBCUTANEOUS
  Filled 2020-06-23: qty 1

## 2020-06-23 MED ORDER — LORAZEPAM 2 MG/ML IJ SOLN
0.5000 mg | Freq: Once | INTRAMUSCULAR | Status: AC
  Administered 2020-06-23: 0.5 mg via INTRAVENOUS
  Filled 2020-06-23: qty 1

## 2020-06-23 MED ORDER — ATORVASTATIN CALCIUM 10 MG PO TABS
80.0000 mg | ORAL_TABLET | Freq: Every day | ORAL | Status: DC
  Administered 2020-06-23: 80 mg via ORAL
  Filled 2020-06-23: qty 1

## 2020-06-23 MED ORDER — ASPIRIN EC 81 MG PO TBEC
81.0000 mg | DELAYED_RELEASE_TABLET | Freq: Every day | ORAL | Status: DC
  Administered 2020-06-23: 81 mg via ORAL
  Filled 2020-06-23: qty 1

## 2020-06-23 MED ORDER — METHOCARBAMOL 500 MG PO TABS: 1000.0000 mg | ORAL_TABLET | Freq: Four times a day (QID) | ORAL | Status: DC | PRN

## 2020-06-23 MED ORDER — CHLORHEXIDINE GLUCONATE CLOTH 2 % EX PADS: 6.0000 | MEDICATED_PAD | Freq: Every day | CUTANEOUS | Status: DC

## 2020-06-23 MED ORDER — LANTHANUM CARBONATE 500 MG PO CHEW
2000.0000 mg | CHEWABLE_TABLET | Freq: Three times a day (TID) | ORAL | Status: DC
  Filled 2020-06-23: qty 4

## 2020-06-23 MED ORDER — SODIUM BICARBONATE 650 MG PO TABS
1300.0000 mg | ORAL_TABLET | Freq: Two times a day (BID) | ORAL | Status: DC
  Filled 2020-06-23: qty 2

## 2020-06-23 MED ORDER — CALCIUM CARBONATE ANTACID 500 MG PO CHEW: 500.0000 mg | CHEWABLE_TABLET | Freq: Every day | ORAL | Status: DC

## 2020-06-23 MED ORDER — NITROGLYCERIN 0.4 MG SL SUBL: 0.4000 mg | SUBLINGUAL_TABLET | SUBLINGUAL | Status: DC | PRN

## 2020-06-23 MED ORDER — CARVEDILOL 3.125 MG PO TABS
3.1250 mg | ORAL_TABLET | Freq: Two times a day (BID) | ORAL | Status: DC
  Administered 2020-06-23: 3.125 mg via ORAL
  Filled 2020-06-23: qty 1

## 2020-06-23 MED ORDER — LANTHANUM CARBONATE 500 MG PO CHEW
2000.0000 mg | CHEWABLE_TABLET | Freq: Three times a day (TID) | ORAL | Status: DC
  Administered 2020-06-23: 2000 mg via ORAL
  Filled 2020-06-23 (×2): qty 4

## 2020-06-23 MED ORDER — CALCIUM CARBONATE ANTACID 500 MG PO CHEW: 2.0000 | CHEWABLE_TABLET | Freq: Every day | ORAL | Status: DC

## 2020-06-23 MED ORDER — CLOPIDOGREL BISULFATE 75 MG PO TABS
75.0000 mg | ORAL_TABLET | Freq: Every day | ORAL | Status: DC
  Administered 2020-06-23: 75 mg via ORAL
  Filled 2020-06-23: qty 1

## 2020-06-23 MED ORDER — CALCITRIOL 0.5 MCG PO CAPS: 1.2500 ug | ORAL_CAPSULE | ORAL | Status: DC

## 2020-06-23 MED ORDER — INSULIN ASPART 100 UNIT/ML ~~LOC~~ SOLN: 0.0000 [IU] | Freq: Three times a day (TID) | SUBCUTANEOUS | Status: DC

## 2020-06-23 MED ORDER — PANTOPRAZOLE SODIUM 40 MG PO TBEC
40.0000 mg | DELAYED_RELEASE_TABLET | Freq: Every day | ORAL | Status: DC
  Administered 2020-06-23: 40 mg via ORAL
  Filled 2020-06-23: qty 1

## 2020-06-23 MED ORDER — CALCITRIOL 0.5 MCG PO CAPS: 0.7500 ug | ORAL_CAPSULE | ORAL | Status: DC

## 2020-06-23 MED ORDER — ISOSORBIDE MONONITRATE ER 30 MG PO TB24
30.0000 mg | ORAL_TABLET | Freq: Every day | ORAL | Status: DC
  Administered 2020-06-23: 30 mg via ORAL
  Filled 2020-06-23: qty 1

## 2020-06-23 MED ORDER — CARVEDILOL 3.125 MG PO TABS: 12.5000 mg | ORAL_TABLET | Freq: Two times a day (BID) | ORAL | Status: DC

## 2020-06-23 NOTE — H&P (Addendum)
History and Physical    [name] DXA:[id number] DOB: 1976/06/16 DOA: 06/23/2020  PCP: Sandi Mariscal, MD (Confirm with patient/family/NH records and if not entered, this has to be entered at [facility] point of entry) Patient coming from: Home  I have personally briefly reviewed patient's old medical records in [place]  Chief Complaint: Chest pain  HPI: [name]. is a 44 y.o. male with medical history significant of CAD s/p CABG in 2019, hypertension, HLD, ESRD on HD Monday Wednesday Friday, PVD status post bilateral BKA presented with chest pains.  Patient received 1 dose of Ativan in the ED around lunchtime for agitation, and then became very lethargic.  Only provided some of the history, and the right of history was abated by patient's significant other named [place] over the phone.  Patient started to feel pressure-like chest pain on the left side of chest radiated to the left shoulder, this morning while sleeping, just above an hour, subsided after gave him 2 nitro by EMS.  He felt nauseous and had 1 time vomited.  Described the pain as sharp, 8/10.  His last hemodialysis was on Friday last week.  He still smokes.   Contact patient significant other days over the phone who reported the patient has been very restless not able to sleep whole night last night, and started to feel nauseous and threw up stomach content couple times around 330 this morning.  And same time, started to complain about pressure-like chest pain.  The patient tried to eat some food and then went back to sleep.  However around 530 this morning patient woke up again with the similar pain, and this time the pain did not go away and lasted about 1 hour that is when the family decided to call EMS. Family also reported that patient has been compliant with all his CAD medications however he has lost his follow-up with his cardiologist for more than a year.  He told most of his medications by nephrologist  on weekly basis.  ED Course: Blood pressure elevated, very agitated and received 1 dose of Ativan and became lethargic.  Troponin 64>71, EKG no significant ST changes.  X-ray no signs of fluid overload.  Review of Systems: Unable to perform patient lethargic.   Past Medical History:  Diagnosis Date  . Anemia   . Atherosclerosis of lower extremity ([place])   . Blood clot in vein    right calf  . CAD (coronary artery disease)   . Cataracts, bilateral   . Chronic combined systolic and diastolic heart failure ([place])   . Complication of anesthesia   . Depression   . ESRD (end stage renal disease) on dialysis [facility])    "MWF [employer]" (03/08/2017)  . GERD (gastroesophageal reflux disease)   . Heart murmur   . History of blood transfusion    "related to OR"  . Hypertension   . Myocardial infarction ([place])     " light"  . Nonhealing surgical wound    nonviable tissue  . PONV (postoperative nausea and vomiting)   . S/P unilateral BKA (below knee amputation), left ([place])   . Type II diabetes mellitus ([place])   . Wears glasses     Past Surgical History:  Procedure Laterality Date  . ABDOMINAL AORTOGRAM N/A 11/02/2016   Procedure: Abdominal Aortogram;  Surgeon: Waynetta Sandy, MD;  Location: [place] CV LAB;  Service: Cardiovascular;  Laterality: N/A;  . ABDOMINAL AORTOGRAM W/LOWER EXTREMITY Right 08/07/2018  Procedure: ABDOMINAL AORTOGRAM W/LOWER EXTREMITY;  Surgeon: [name], MD;  Location: [place] CV LAB;  Service: Cardiovascular;  Laterality: Right;  . AMPUTATION Left 09/27/2013   Procedure: LEFT GREAT TOE AMPUTATION;  Surgeon: [name], MD;  Location: [place];  Service: Orthopedics;  Laterality: Left;  . AMPUTATION Right 08/15/2015   Procedure: Right Great Toe Amputation;  Surgeon: [name], MD;  Location: [place];  Service: Orthopedics;  Laterality: Right;  . AMPUTATION Left 11/05/2016   Procedure: TRANSMETATARSAL AMPUTATION LEFT FOOT;  Surgeon:  [name], MD;  Location: [place];  Service: Orthopedics;  Laterality: Left;  . AMPUTATION Left 03/11/2017   Procedure: LEFT BELOW KNEE AMPUTATION;  Surgeon: [name], MD;  Location: [place];  Service: Orthopedics;  Laterality: Left;  . AMPUTATION Right 03/11/2017   Procedure: RIGHT 2ND TOE AMPUTATION;  Surgeon: [name], MD;  Location: [place];  Service: Orthopedics;  Laterality: Right;  . AMPUTATION Right 11/17/2018   Procedure: AMPUTATION BELOW KNEE;  Surgeon: [name], MD;  Location: [place];  Service: Vascular;  Laterality: Right;  . AV FISTULA PLACEMENT  left arm  . CORONARY ARTERY BYPASS GRAFT N/A 11/04/2017   Procedure: CORONARY ARTERY BYPASS GRAFTING (CABG) x three, using left internal mammary artery and right    leg greater saphenous vein;  Surgeon: Melrose Nakayama, MD;  Location: [place];  Service: Open Heart Surgery;  Laterality: N/A;  . FASCIOTOMY Right 08/07/2018   Procedure: FOUR COMPARTMENT FASCIOTOMY OF RIGHT LOWER LEG;  Surgeon: Rosetta Posner, MD;  Location: [place];  Service: Vascular;  Laterality: Right;  . FASCIOTOMY CLOSURE Right 08/10/2018   Procedure: FASCIOTOMY CLOSURE RIGHT LOWER EXTREMITY;  Surgeon: [name], MD;  Location: [place];  Service: Vascular;  Laterality: Right;  . HEMATOMA EVACUATION Right 08/07/2018   Procedure: EVACUATION HEMATOMA;  Surgeon: Rosetta Posner, MD;  Location: [place];  Service: Vascular;  Laterality: Right;  . LEFT HEART CATH AND CORONARY ANGIOGRAPHY N/A 10/31/2017   Procedure: LEFT HEART CATH AND CORONARY ANGIOGRAPHY;  Surgeon: Troy Sine, MD;  Location: [place] CV LAB;  Service: Cardiovascular;  Laterality: N/A;  . LEFT HEART CATHETERIZATION WITH CORONARY ANGIOGRAM N/A 09/13/2014   Procedure: LEFT HEART CATHETERIZATION WITH CORONARY ANGIOGRAM;  Surgeon: Sinclair Grooms, MD;  Location: [facility] CATH LAB;  Service: Cardiovascular;  Laterality: N/A;  . LOWER EXTREMITY ANGIOGRAPHY Bilateral 11/02/2016   Procedure: Lower  Extremity Angiography;  Surgeon: Waynetta Sandy, MD;  Location: [place] CV LAB;  Service: Cardiovascular;  Laterality: Bilateral;  . PERIPHERAL VASCULAR ATHERECTOMY Left 11/02/2016   Procedure: Peripheral Vascular Atherectomy;  Surgeon: Waynetta Sandy, MD;  Location: [place] CV LAB;  Service: Cardiovascular;  Laterality: Left;  PERONEAL  . PERIPHERAL VASCULAR ATHERECTOMY Right 08/07/2018   Procedure: PERIPHERAL VASCULAR ATHERECTOMY;  Surgeon: [name], MD;  Location: [place] CV LAB;  Service: Cardiovascular;  Laterality: Right;  Peroneal artery  . PERIPHERAL VASCULAR BALLOON ANGIOPLASTY Right 08/07/2018   Procedure: PERIPHERAL VASCULAR BALLOON ANGIOPLASTY;  Surgeon: [name], MD;  Location: [place] CV LAB;  Service: Cardiovascular;  Laterality: Right;  anterior tibial artery  . STUMP REVISION Left 01/11/2017   Procedure: Revision Left Transmetatarsal Amputation;  Surgeon: [name], MD;  Location: [place];  Service: Orthopedics;  Laterality: Left;  . TEE WITHOUT CARDIOVERSION N/A 11/04/2017   Procedure: TRANSESOPHAGEAL ECHOCARDIOGRAM (TEE);  Surgeon: Melrose Nakayama, MD;  Location: [place];  Service: Open Heart Surgery;  Laterality: N/A;  . TRANSMETATARSAL AMPUTATION Right 08/10/2018   Procedure: RIGHT FIFTH TOE AMPUTATION;  Surgeon: [name], MD;  Location: [place];  Service: Vascular;  Laterality: Right;  . TRANSMETATARSAL AMPUTATION Right 09/14/2018   Procedure: TRANSMETATARSAL AMPUTATION;  Surgeon: [name], MD;  Location: [place];  Service: Vascular;  Laterality: Right;     reports that he quit smoking about 19 months ago. His smoking use included cigarettes. He has a 13.00 pack-year smoking history. He has never used smokeless tobacco. He reports previous alcohol use. He reports current drug use. Drug: Marijuana.  Allergies  Allergen Reactions  . Coconut Oil Anaphylaxis and Other (See Comments)    Can use  topically, allergic to coconut foods     Family History  Problem Relation Age of Onset  . Heart failure Mother   . Hypertension Mother      Prior to Admission medications   Medication Sig Start Date End Date Taking? Authorizing Provider  acetaminophen (TYLENOL) 325 MG tablet Take 2 tablets (650 mg total) by mouth every 4 (four) hours as needed for headache or mild pain. 09/08/18  Yes Geradine Girt, DO  aspirin EC 81 MG tablet Take 1 tablet (81 mg total) by mouth daily. 02/07/18  Yes Burtis Junes, NP  atorvastatin (LIPITOR) 80 MG tablet Take 1 tablet (80 mg total) by mouth daily at 6 PM. Patient taking differently: Take 80 mg by mouth daily.  11/28/18  Yes Angiulli, Lavon Paganini, PA-C  calcitRIOL (ROCALTROL) 0.25 MCG capsule Take 5 capsules (1.25 mcg total) by mouth every Monday, Wednesday, and Friday with hemodialysis. 11/09/17  Yes Harriet Pho, Tessa N, PA-C  calcium carbonate (TUMS) 500 MG chewable tablet Chew 500 mg by mouth daily. 04/25/20  Yes [provider]  carvedilol (COREG) 3.125 MG tablet Take 3.125 mg by mouth 2 (two) times daily. 06/14/20  Yes [provider]  clopidogrel (PLAVIX) 75 MG tablet Take 1 tablet (75 mg total) by mouth daily. 11/28/18  Yes Angiulli, Lavon Paganini, PA-C  Darbepoetin Alfa (ARANESP) 200 MCG/0.4ML SOSY injection Inject 0.4 mLs (200 mcg total) into the vein every Monday with hemodialysis. 12/04/18  Yes Angiulli, Lavon Paganini, PA-C  esomeprazole (NEXIUM) 40 MG capsule Take 40 mg by mouth daily. 01/17/20  Yes [provider]  isosorbide mononitrate (IMDUR) 30 MG 24 hr tablet Take 1 tablet (30 mg total) by mouth daily. 11/28/18  Yes Angiulli, Lavon Paganini, PA-C  lanthanum (FOSRENOL) 1000 MG chewable tablet Chew 2 tablets (2,000 mg total) by mouth 3 (three) times daily with meals. 11/28/18  Yes Angiulli, Lavon Paganini, PA-C  methocarbamol (ROBAXIN) 500 MG tablet Take 1,000 mg by mouth 4 (four) times daily as needed for muscle spasms. 06/17/20  Yes [provider]  multivitamin (RENA-VIT) TABS tablet Take 1 tablet by mouth at bedtime. Patient taking differently: Take 1 tablet by mouth daily.  08/17/18  Yes Kayleen Memos, DO  nitroGLYCERIN (NITROSTAT) 0.4 MG SL tablet Place 1 tablet (0.4 mg total) under the tongue every 5 (five) minutes as needed for chest pain. 03/20/19 06/23/20 Yes Josue Hector, MD  insulin aspart (NOVOLOG) 100 UNIT/ML FlexPen Inject 3 Units into the skin 3 (three) times daily with meals. Patient not taking: Reported on 03/10/2020 11/28/18   Angiulli, Lavon Paganini, PA-C  Insulin Glargine (LANTUS) 100 UNIT/ML Solostar Pen Inject 14 Units into the skin at bedtime. Patient not taking: Reported on 03/10/2020 11/28/18   [place], Lavon Paganini,  PA-C  oxyCODONE-acetaminophen (PERCOCET) 5-325 MG tablet Take 1 tablet by mouth every 4 (four) hours as needed for severe pain. Patient not taking: Reported on 06/23/2020 03/10/20   Daleen Bo, MD    Physical Exam: Vitals:   06/23/20 1415 06/23/20 1430 06/23/20 1445 06/23/20 1500  BP: (!) 163/71 (!) 164/65 (!) 179/66 (!) 185/88  Pulse:      Resp: (!) 9 (!) 28 15 16   Temp:      SpO2:      Weight:      Height:        Constitutional: NAD, calm, comfortable Vitals:   06/23/20 1415 06/23/20 1430 06/23/20 1445 06/23/20 1500  BP: (!) 163/71 (!) 164/65 (!) 179/66 (!) 185/88  Pulse:      Resp: (!) 9 (!) 28 15 16   Temp:      SpO2:      Weight:      Height:       Eyes: PERRL, lids and conjunctivae normal ENMT: Mucous membranes are moist. Posterior pharynx clear of any exudate or lesions.Normal dentition.  Neck: normal, supple, no masses, no thyromegaly Respiratory: clear to auscultation bilaterally, no wheezing, no crackles. Normal respiratory effort. No accessory muscle use.  Cardiovascular: Regular rate and rhythm, no murmurs / rubs / gallops. No extremity edema. 2+ pedal pulses. No carotid bruits.  2+ bilateral radial pulses Abdomen: no tenderness, no masses palpated. No  hepatosplenomegaly. Bowel sounds positive.  Musculoskeletal: no clubbing / cyanosis. No joint deformity upper and lower extremities. Good ROM, no contractures. Normal muscle tone.  Skin: no rashes, lesions, ulcers. No induration Neurologic: Arousable, lethargic, following simple commands. Psychiatric: Sleepy    Labs on Admission: I have personally reviewed following labs and imaging studies  CBC: Recent Labs  Lab 06/23/20 1025  WBC 8.0  NEUTROABS 5.5  HGB 12.3*  HCT 38.8*  MCV 90.2  PLT 025   Basic Metabolic Panel: Recent Labs  Lab 06/23/20 1025  NA 133*  K 5.2*  CL 103  CO2 9*  GLUCOSE 176*  BUN 83*  CREATININE 20.55*  CALCIUM 8.7*   GFR: Estimated Creatinine Clearance: 5.5 mL/min (A) (by C-G formula based on SCr of 20.55 mg/dL (H)). Liver Function Tests: Recent Labs  Lab 06/23/20 1025  AST 29  ALT 22  ALKPHOS 45  BILITOT 1.0  PROT 7.7  ALBUMIN 3.7   No results for input(s): LIPASE, AMYLASE in the last 168 hours. No results for input(s): AMMONIA in the last 168 hours. Coagulation Profile: No results for input(s): INR, PROTIME in the last 168 hours. Cardiac Enzymes: No results for input(s): CKTOTAL, CKMB, CKMBINDEX, TROPONINI in the last 168 hours. BNP (last 3 results) No results for input(s): PROBNP in the last 8760 hours. HbA1C: No results for input(s): HGBA1C in the last 72 hours. CBG: No results for input(s): GLUCAP in the last 168 hours. Lipid Profile: No results for input(s): CHOL, HDL, LDLCALC, TRIG, CHOLHDL, LDLDIRECT in the last 72 hours. Thyroid Function Tests: No results for input(s): TSH, T4TOTAL, FREET4, T3FREE, THYROIDAB in the last 72 hours. Anemia Panel: No results for input(s): VITAMINB12, FOLATE, FERRITIN, TIBC, IRON, RETICCTPCT in the last 72 hours. Urine analysis:    Component Value Date/Time   COLORURINE RED BIOCHEMICALS MAY BE AFFECTED BY COLOR (A) 01/29/2010 0939   APPEARANCEUR CLOUDY (A) 01/29/2010 0939   LABSPEC 1.020  01/29/2010 0939   PHURINE 8.0 01/29/2010 0939   GLUCOSEU NEGATIVE 01/29/2010 0939   HGBUR LARGE (A) 01/29/2010 0939   BILIRUBINUR  MODERATE (A) 01/29/2010 0939   KETONESUR 15 (A) 01/29/2010 0939   PROTEINUR >300 (A) 01/29/2010 0939   UROBILINOGEN 0.2 01/29/2010 0939   NITRITE POSITIVE (A) 01/29/2010 0939   LEUKOCYTESUR LARGE (A) 01/29/2010 0939    Radiological Exams on Admission: DG Chest 2 View  Result Date: 06/23/2020 CLINICAL DATA:  Chest pain EXAM: CHEST - 2 VIEW COMPARISON:  03/10/2020 FINDINGS: Post CABG changes. The heart size and mediastinal contours are stable. Atherosclerotic calcification of the aortic knob. Both lungs are clear. The visualized skeletal structures are unremarkable. IMPRESSION: No active cardiopulmonary disease. Electronically Signed   By: Davina Poke D.O.   On: 06/23/2020 11:31     EKG: Independently reviewed. Sinus, no acute ST changes, borderline QTC prolongation  Assessment/Plan Active Problems:   Chest pain  (please populate well all problems here in Problem List. (For example, if patient is on BP meds at home and you resume or decide to hold them, it is a problem that needs to be her. Same for CAD, COPD, HLD and so on)  Angina -Given the significant cardiac history with MI CAD status post CABG in 2019 but noncompliant with cardiac follow-ups and no stress tests in the past 2 years, contact cardiology for further cardiac work-up for angina. -Echocardiogram, UDS. -Continue aspirin Plavix and statin  Hyperkalemia -Likely related to acidosis and ESRD, no EKG changes, HD emergent.  Uncontrolled hypertension -Suspect related to angina and agitation, radial pulses equal bilaterally, low suspicion for dissection as of now. -Continue home BP meds, emergency dialysis underway.  Metabolic acidosis, acute on chronic -Emergency HD and reevaluate -Start p.o. bicarb supplement  PVD status post BKA bilaterally -Continue Plavix and  statin  IDDM -Sliding scale for now  DVT prophylaxis: Heparin subcu Code Status: Full code Family Communication: Tiesha/significant other over the phone Disposition Plan: Chest pain work-up, expect less than 2 midnight hospital stay Consults called: Nephrology and cardiology Admission status: Tele admission   Lequita Halt MD [facility] Pager [phone]  06/23/2020, 3:41 PM

## 2020-06-23 NOTE — Consult Note (Signed)
Cardiology Consultation:   Patient ID: [name] Elenes Sr. MRN: [id number]; DOB: Oct 06, 1975  Admit date: 06/23/2020 Date of Consult: 06/23/2020  Primary Care Provider: Sandi Mariscal, MD [facility] Cardiologist: Jenkins Rouge, MD  [facility] Electrophysiologist:  None    Patient Profile:   [name] Cheese. is a 44 y.o. male with a hx of ESRD on HD MWF, right lower extremity DVT, chronic diastolic heart failure, hypertension, hyperlipidemia, diabetes, CAD status post CABG times 11/02/2017, severe PAD status post left BKA and right partial foot amputation who is being seen today for the evaluation of chest pain at the request of Dr. Roosevelt Locks.  History of Present Illness:   [name] is a 44 year old male with past medical history noted above.  He has a history of mild LV dysfunction dating back to November 2007, echocardiogram in 2011 showed normal EF.  He was seen in March 2019 with chest pain and found to have three-vessel disease and subsequently underwent CABG x3 with LIMA to the LAD, SVG to PDA, SVG to OM by Dr. Roxan Hockey.  Diabetes was poorly controlled at that time.  He had an office visit with Dr. Olive Bass in 05/2018 at that point he reported doing well.  It was recommended for him to follow-up within a year.  He was admitted for hematoma of his right calf with possible compartment syndrome in December 2019 and underwent 4 compartment fasciotomy with hematoma evacuation and amputation of the right fifth toe.  [facility] secondary to chest discomfort.  Troponin was noted to be borderline elevated.  It was felt this was more likely related to demand ischemia.  Echocardiogram during that admission showed an EF of 50 to 55% with severe mitral valve calcification, mild MR.  He underwent right transmetatarsal amputation with Dr. Carlis Abbott 09/14/2018.  Developed recurrent pain in his foot prompting him to seek medical attention in the ER several times  afterwards.  Was seen in consultation 10/2018 after being in his dialysis center and noted to have a fever of 101.6.  Also complained of chest pain similar to his previous angina.  It was felt his symptoms were more so related to demand ischemia in the setting of acute infection and no further cardiac work-up was pursued.  Presented back to the ED on 11/8 with chest pain.  Reports woke up with symptoms around 3 AM. Reports left sided chest pain with radiation into the right shoulder. Lasted for about an hour then resolved. Was able to go back to sleep then woke up and ate breakfast. Symptoms then returned with abd bloating, nausea and he vomited. EMS was called, given 2SL nitro en route and CP resolved. Reports his symptoms are similar to what he experienced with his CABG back in 2019.   Labs in the ED showed Na+ 133, K+ 5.2, Cr 20.55, BUN 83, hsTn 64>>71, WBC 8.0, Hgb 12.3. EKG showed SR with nonspecific T wave changes. CXR was negative. Blood pressures were significantly elevated on arrival and he was very agitated. Was given a dose of ativan and became lethargic. He was admitted to [facility] for further management and nephrology consulted for HD needs.    Past Medical History:  Diagnosis Date  . Anemia   . Atherosclerosis of lower extremity ([place])   . Blood clot in vein    right calf  . CAD (coronary artery disease)   . Cataracts, bilateral   . Chronic combined systolic and diastolic heart failure (  [place])   . Complication of anesthesia   . Depression   . ESRD (end stage renal disease) on dialysis [facility])    "MWF [employer]" (03/08/2017)  . GERD (gastroesophageal reflux disease)   . Heart murmur   . History of blood transfusion    "related to OR"  . Hypertension   . Myocardial infarction ([place])     " light"  . Nonhealing surgical wound    nonviable tissue  . PONV (postoperative nausea and vomiting)   . S/P unilateral BKA (below knee amputation), left ([place])   . Type II diabetes mellitus ([place])     . Wears glasses     Past Surgical History:  Procedure Laterality Date  . ABDOMINAL AORTOGRAM N/A 11/02/2016   Procedure: Abdominal Aortogram;  Surgeon: Waynetta Sandy, MD;  Location: [place] CV LAB;  Service: Cardiovascular;  Laterality: N/A;  . ABDOMINAL AORTOGRAM W/LOWER EXTREMITY Right 08/07/2018   Procedure: ABDOMINAL AORTOGRAM W/LOWER EXTREMITY;  Surgeon: [name], MD;  Location: [place] CV LAB;  Service: Cardiovascular;  Laterality: Right;  . AMPUTATION Left 09/27/2013   Procedure: LEFT GREAT TOE AMPUTATION;  Surgeon: [name], MD;  Location: [place];  Service: Orthopedics;  Laterality: Left;  . AMPUTATION Right 08/15/2015   Procedure: Right Great Toe Amputation;  Surgeon: [name], MD;  Location: [place];  Service: Orthopedics;  Laterality: Right;  . AMPUTATION Left 11/05/2016   Procedure: TRANSMETATARSAL AMPUTATION LEFT FOOT;  Surgeon: [name], MD;  Location: [place];  Service: Orthopedics;  Laterality: Left;  . AMPUTATION Left 03/11/2017   Procedure: LEFT BELOW KNEE AMPUTATION;  Surgeon: [name], MD;  Location: [place];  Service: Orthopedics;  Laterality: Left;  . AMPUTATION Right 03/11/2017   Procedure: RIGHT 2ND TOE AMPUTATION;  Surgeon: [name], MD;  Location: [place];  Service: Orthopedics;  Laterality: Right;  . AMPUTATION Right 11/17/2018   Procedure: AMPUTATION BELOW KNEE;  Surgeon: [name], MD;  Location: [place];  Service: Vascular;  Laterality: Right;  . AV FISTULA PLACEMENT  left arm  . CORONARY ARTERY BYPASS GRAFT N/A 11/04/2017   Procedure: CORONARY ARTERY BYPASS GRAFTING (CABG) x three, using left internal mammary artery and right    leg greater saphenous vein;  Surgeon: Melrose Nakayama, MD;  Location: [place];  Service: Open Heart Surgery;  Laterality: N/A;  . FASCIOTOMY Right 08/07/2018   Procedure: FOUR COMPARTMENT FASCIOTOMY OF RIGHT LOWER LEG;  Surgeon: Rosetta Posner, MD;  Location: [place];  Service:  Vascular;  Laterality: Right;  . FASCIOTOMY CLOSURE Right 08/10/2018   Procedure: FASCIOTOMY CLOSURE RIGHT LOWER EXTREMITY;  Surgeon: [name], MD;  Location: [place];  Service: Vascular;  Laterality: Right;  . HEMATOMA EVACUATION Right 08/07/2018   Procedure: EVACUATION HEMATOMA;  Surgeon: Rosetta Posner, MD;  Location: [place];  Service: Vascular;  Laterality: Right;  . LEFT HEART CATH AND CORONARY ANGIOGRAPHY N/A 10/31/2017   Procedure: LEFT HEART CATH AND CORONARY ANGIOGRAPHY;  Surgeon: Troy Sine, MD;  Location: [place] CV LAB;  Service: Cardiovascular;  Laterality: N/A;  . LEFT HEART CATHETERIZATION WITH CORONARY ANGIOGRAM N/A 09/13/2014   Procedure: LEFT HEART CATHETERIZATION WITH CORONARY ANGIOGRAM;  Surgeon: Sinclair Grooms, MD;  Location: [facility] CATH LAB;  Service: Cardiovascular;  Laterality: N/A;  . LOWER EXTREMITY ANGIOGRAPHY Bilateral 11/02/2016   Procedure: Lower Extremity Angiography;  Surgeon: Waynetta Sandy, MD;  Location: [place] CV LAB;  Service: Cardiovascular;  Laterality: Bilateral;  . PERIPHERAL VASCULAR ATHERECTOMY Left 11/02/2016   Procedure: Peripheral Vascular Atherectomy;  Surgeon: Waynetta Sandy, MD;  Location: [place] CV LAB;  Service: Cardiovascular;  Laterality: Left;  PERONEAL  . PERIPHERAL VASCULAR ATHERECTOMY Right 08/07/2018   Procedure: PERIPHERAL VASCULAR ATHERECTOMY;  Surgeon: [name], MD;  Location: [place] CV LAB;  Service: Cardiovascular;  Laterality: Right;  Peroneal artery  . PERIPHERAL VASCULAR BALLOON ANGIOPLASTY Right 08/07/2018   Procedure: PERIPHERAL VASCULAR BALLOON ANGIOPLASTY;  Surgeon: [name], MD;  Location: [place] CV LAB;  Service: Cardiovascular;  Laterality: Right;  anterior tibial artery  . STUMP REVISION Left 01/11/2017   Procedure: Revision Left Transmetatarsal Amputation;  Surgeon: [name], MD;  Location: [place];  Service: Orthopedics;  Laterality: Left;  .  TEE WITHOUT CARDIOVERSION N/A 11/04/2017   Procedure: TRANSESOPHAGEAL ECHOCARDIOGRAM (TEE);  Surgeon: Melrose Nakayama, MD;  Location: [place];  Service: Open Heart Surgery;  Laterality: N/A;  . TRANSMETATARSAL AMPUTATION Right 08/10/2018   Procedure: RIGHT FIFTH TOE AMPUTATION;  Surgeon: [name], MD;  Location: [place];  Service: Vascular;  Laterality: Right;  . TRANSMETATARSAL AMPUTATION Right 09/14/2018   Procedure: TRANSMETATARSAL AMPUTATION;  Surgeon: [name], MD;  Location: [place];  Service: Vascular;  Laterality: Right;     Home Medications:  Prior to Admission medications   Medication Sig Start Date End Date Taking? Authorizing Provider  acetaminophen (TYLENOL) 325 MG tablet Take 2 tablets (650 mg total) by mouth every 4 (four) hours as needed for headache or mild pain. 09/08/18  Yes Geradine Girt, DO  aspirin EC 81 MG tablet Take 1 tablet (81 mg total) by mouth daily. 02/07/18  Yes Burtis Junes, NP  atorvastatin (LIPITOR) 80 MG tablet Take 1 tablet (80 mg total) by mouth daily at 6 PM. Patient taking differently: Take 80 mg by mouth daily.  11/28/18  Yes Angiulli, Lavon Paganini, PA-C  calcitRIOL (ROCALTROL) 0.25 MCG capsule Take 5 capsules (1.25 mcg total) by mouth every Monday, Wednesday, and Friday with hemodialysis. 11/09/17  Yes Harriet Pho, Tessa N, PA-C  calcium carbonate (TUMS) 500 MG chewable tablet Chew 500 mg by mouth daily. 04/25/20  Yes [provider]  carvedilol (COREG) 3.125 MG tablet Take 3.125 mg by mouth 2 (two) times daily. 06/14/20  Yes [provider]  clopidogrel (PLAVIX) 75 MG tablet Take 1 tablet (75 mg total) by mouth daily. 11/28/18  Yes Angiulli, Lavon Paganini, PA-C  Darbepoetin Alfa (ARANESP) 200 MCG/0.4ML SOSY injection Inject 0.4 mLs (200 mcg total) into the vein every Monday with hemodialysis. 12/04/18  Yes Angiulli, Lavon Paganini, PA-C  esomeprazole (NEXIUM) 40 MG capsule Take 40 mg by mouth daily. 01/17/20  Yes [provider]    isosorbide mononitrate (IMDUR) 30 MG 24 hr tablet Take 1 tablet (30 mg total) by mouth daily. 11/28/18  Yes Angiulli, Lavon Paganini, PA-C  lanthanum (FOSRENOL) 1000 MG chewable tablet Chew 2 tablets (2,000 mg total) by mouth 3 (three) times daily with meals. 11/28/18  Yes Angiulli, Lavon Paganini, PA-C  methocarbamol (ROBAXIN) 500 MG tablet Take 1,000 mg by mouth 4 (four) times daily as needed for muscle spasms. 06/17/20  Yes [provider]  multivitamin (RENA-VIT) TABS tablet Take 1 tablet by mouth at bedtime. Patient taking differently: Take 1 tablet by mouth daily.  08/17/18  Yes Kayleen Memos, DO  nitroGLYCERIN (NITROSTAT) 0.4 MG SL tablet Place 1 tablet (0.4 mg total) under the tongue every 5 (five)  minutes as needed for chest pain. 03/20/19 06/23/20 Yes Josue Hector, MD  insulin aspart (NOVOLOG) 100 UNIT/ML FlexPen Inject 3 Units into the skin 3 (three) times daily with meals. Patient not taking: Reported on 03/10/2020 11/28/18   Angiulli, Lavon Paganini, PA-C  Insulin Glargine (LANTUS) 100 UNIT/ML Solostar Pen Inject 14 Units into the skin at bedtime. Patient not taking: Reported on 03/10/2020 11/28/18   Angiulli, Lavon Paganini, PA-C  oxyCODONE-acetaminophen (PERCOCET) 5-325 MG tablet Take 1 tablet by mouth every 4 (four) hours as needed for severe pain. Patient not taking: Reported on 06/23/2020 03/10/20   Daleen Bo, MD    Inpatient Medications: Scheduled Meds: . aspirin EC  81 mg Oral Daily  . atorvastatin  80 mg Oral Daily  . [START ON 06/25/2020] calcitRIOL  1.25 mcg Oral Q M,W,F-HD  . calcium carbonate  2 tablet Oral QHS  . [START ON 06/24/2020] calcium carbonate  500 mg Oral Daily  . carvedilol  3.125 mg Oral BID  . [START ON 06/24/2020] Chlorhexidine Gluconate Cloth  6 each Topical Q0600  . clopidogrel  75 mg Oral Daily  . heparin  5,000 Units Subcutaneous Q12H  . insulin aspart  0-9 Units Subcutaneous TID WC  . isosorbide mononitrate  30 mg Oral Daily  . lanthanum  2,000 mg Oral TID WC  .  pantoprazole  40 mg Oral Daily  . sodium bicarbonate  1,300 mg Oral BID   Continuous Infusions:  PRN Meds: acetaminophen, methocarbamol, nitroGLYCERIN, ondansetron (ZOFRAN) IV  Allergies:    Allergies  Allergen Reactions  . Coconut Oil Anaphylaxis and Other (See Comments)    Can use topically, allergic to coconut foods     Social History:   Social History   Socioeconomic History  . Marital status: Legally Separated    Spouse name: Not on file  . Number of children: Not on file  . Years of education: Not on file  . Highest education level: Not on file  Occupational History  . Not on file  Tobacco Use  . Smoking status: Former Smoker    Packs/day: 0.50    Years: 26.00    Pack years: 13.00    Types: Cigarettes    Quit date: 10/25/2018    Years since quitting: 1.6  . Smokeless tobacco: Never Used  Vaping Use  . Vaping Use: Never used  Substance and Sexual Activity  . Alcohol use: Not Currently    Comment:  "haven't took a drink in over 5 years"  . Drug use: Yes    Types: Marijuana    Comment: occasional  . Sexual activity: Yes  Other Topics Concern  . Not on file  Social History Narrative  . Not on file   Social Determinants of Health   Financial Resource Strain:   . Difficulty of Paying Living Expenses: Not on file  Food Insecurity:   . Worried About Charity fundraiser in the Last Year: Not on file  . Ran Out of Food in the Last Year: Not on file  Transportation Needs:   . Lack of Transportation (Medical): Not on file  . Lack of Transportation (Non-Medical): Not on file  Physical Activity:   . Days of Exercise per Week: Not on file  . Minutes of Exercise per Session: Not on file  Stress:   . Feeling of Stress : Not on file  Social Connections:   . Frequency of Communication with Friends and Family: Not on file  . Frequency of Social Gatherings  with Friends and Family: Not on file  . Attends Religious Services: Not on file  . Active Member of Clubs or  Organizations: Not on file  . Attends Archivist Meetings: Not on file  . Marital Status: Not on file  Intimate Partner Violence:   . Fear of Current or Ex-Partner: Not on file  . Emotionally Abused: Not on file  . Physically Abused: Not on file  . Sexually Abused: Not on file    Family History:    Family History  Problem Relation Age of Onset  . Heart failure Mother   . Hypertension Mother      ROS:  Please see the history of present illness.   All other ROS reviewed and negative.     Physical Exam/Data:   Vitals:   06/23/20 1430 06/23/20 1445 06/23/20 1500 06/23/20 1609  BP: (!) 164/65 (!) 179/66 (!) 185/88 (!) 143/65  Pulse:    75  Resp: (!) _0 Temp:      SpO2:    98%  Weight:      Height:       No intake or output data in the 24 hours ending 06/23/20 1614 Last 3 Weights 06/23/2020 03/15/2019 12/12/2018  Weight (lbs) 194 lb 169 lb 12.1 oz 156 lb 8.4 oz  Weight (kg) 87.998 kg 77 kg 71 kg  Some encounter information is confidential and restricted. Go to Review Flowsheets activity to see all data.     Body mass index is 24.25 kg/m.  General:  Well nourished, well developed, in no acute distress HEENT: normal Lymph: no adenopathy Neck: no JVD Endocrine:  No thryomegaly Vascular: No carotid bruits Cardiac:  normal S1, S2; RRR; no murmur  Lungs:  clear to auscultation bilaterally, no wheezing, rhonchi or rales  Abd: soft, nontender, no hepatomegaly  Ext: no edema Musculoskeletal:  Bilateral BKA Skin: warm and dry  Neuro:  CNs 2-12 intact, no focal abnormalities noted Psych:  Normal affect   EKG:  The EKG was personally reviewed and demonstrates:  SR with nonspecific T wave changes  Relevant CV Studies:  Echo: 08/2018  Study Conclusions   - Left ventricle: The cavity size was mildly dilated. Wall  thickness was normal. Systolic function was normal. The estimated  ejection fraction was in the range of 50% to 55%. Wall motion was    normal; there were no regional wall motion abnormalities. Doppler  parameters are consistent with abnormal left ventricular  relaxation (grade 1 diastolic dysfunction).  - Mitral valve: Severely calcified annulus. Mildly thickened  leaflets .  - Left atrium: The atrium was mildly dilated.  - Atrial septum: There was an atrial septal aneurysm.   Impressions:   - Normal LV systolic function; mild LVH; mild diastolic  dysfunction; severe MAC with possible small oscillating density  on posterior annulus (consider blood cultures if clinically  indicated); mild MR.   Laboratory Data:  High Sensitivity Troponin:   Recent Labs  Lab 06/23/20 1025 06/23/20 1239  TROPONINIHS 64* 71*     Chemistry Recent Labs  Lab 06/23/20 1025  NA 133*  K 5.2*  CL 103  CO2 9*  GLUCOSE 176*  BUN 83*  CREATININE 20.55*  CALCIUM 8.7*  GFRNONAA 3*  ANIONGAP 21*    Recent Labs  Lab 06/23/20 1025  PROT 7.7  ALBUMIN 3.7  AST 29  ALT 22  ALKPHOS 45  BILITOT 1.0   Hematology Recent Labs  Lab 06/23/20 1025  WBC  8.0  RBC 4.30  HGB 12.3*  HCT 38.8*  MCV 90.2  MCH 28.6  MCHC 31.7  RDW 17.0*  PLT 363   BNPNo results for input(s): BNP, PROBNP in the last 168 hours.  DDimer No results for input(s): DDIMER in the last 168 hours.   Radiology/Studies:  DG Chest 2 View  Result Date: 06/23/2020 CLINICAL DATA:  Chest pain EXAM: CHEST - 2 VIEW COMPARISON:  03/10/2020 FINDINGS: Post CABG changes. The heart size and mediastinal contours are stable. Atherosclerotic calcification of the aortic knob. Both lungs are clear. The visualized skeletal structures are unremarkable. IMPRESSION: No active cardiopulmonary disease. Electronically Signed   By: Davina Poke D.O.   On: 06/23/2020 11:31     Assessment and Plan:   [name] Liddy Sr. is a 44 y.o. male with a hx of ESRD on HD MWF, right lower extremity DVT, chronic diastolic heart failure, hypertension, hyperlipidemia,  diabetes, CAD status post CABG times 11/02/2017, severe PAD status post left BKA and right partial foot amputation who is being seen today for the evaluation of chest pain at the request of Dr. Roosevelt Locks.  1. Chest pain: reports being awoken from sleep this morning around 3am with chest pain which radiated to his right shoulder and arm. Had recurrent episode after eating. EMS gave 2 SL nitro with resolution of pain. He also complains of abd pain, and bloated feeling. Reports being complaint with home medications. hsTn 64>>71. EKG without ischemia. Blood pressures severely elevated on admission and noted to be acidotic as well. Suspect demand ischemia, would treat medically for now  -- Check echo -- check additional troponin -- further titrate coreg to 12.26m BID and follow up BPs -- ASA, statin, BB, and Imdur  2. ESRD on HD with metabolic acidosis: reports last session was Friday and able to sit for entire session. Nephrology following and planned for normal HD session.   3. Uncontrolled HTN: blood pressures significantly elevated on arrival. Has been continued on home coreg 3.1252mBID, along with Imdur 306maily. -- as above will further increase coreg to 12.5mg29mD  4. IDDM: Hgb A1c 8.2 -- SSI  5. PAD s/p bilateral BKA: followed by Vasc, amp site stable  6. Substance abuse: + marijuana per pt report   For questions or updates, please contact [email or web address] consult [email or web address] for contact info under    Signed, LindReino Bellis  06/23/2020 4:14 PM

## 2020-06-23 NOTE — Consult Note (Signed)
[place]  Reason for Consultation: Management of ESRD/hemodialysis; anemia, hypertension/volume and secondary hyperparathyroidism Requesting Provider: Dr. Jeanell Sparrow  HPI: [name] Alphin Sr. is an 44 y.o. male with ESRD on hemodialysis MWF at [facility]. PMH significant for DMT2, HTN, CAD S/P CABG, combined systolic and diastolic HF, PAD S/P L BKA, R TMA, SHPT, AOCD.   Woke up 3am with CP - improved and went back to sleep; 8am ate and it returned, had emesis.  Rec'd ASA and NTG x 2 via EMS and CP improved.  In ED trop mildly elevated, CXR clear. K 5.2, bicarb 9, AG 21. Hypertensive, afebrile, WBC 8.  History mainly obtained from fiance as he's sleeping.  He thought it was heartburn as he had belching and dyspepsia.  Did try pepto and NTG at home but nothing helped.    +MJ recently but no other drugs or EtOH.  PMH: Past Medical History:  Diagnosis Date  . Anemia   . Atherosclerosis of lower extremity ([place])   . Blood clot in vein    right calf  . CAD (coronary artery disease)   . Cataracts, bilateral   . Chronic combined systolic and diastolic heart failure ([place])   . Complication of anesthesia   . Depression   . ESRD (end stage renal disease) on dialysis [facility])    "MWF [employer]" (03/08/2017)  . GERD (gastroesophageal reflux disease)   . Heart murmur   . History of blood transfusion    "related to OR"  . Hypertension   . Myocardial infarction ([place])     " light"  . Nonhealing surgical wound    nonviable tissue  . PONV (postoperative nausea and vomiting)   . S/P unilateral BKA (below knee amputation), left ([place])   . Type II diabetes mellitus ([place])   . Wears glasses    PSH: Past Surgical History:  Procedure Laterality Date  . ABDOMINAL AORTOGRAM N/A 11/02/2016   Procedure: Abdominal Aortogram;  Surgeon: Waynetta Sandy, MD;  Location: [place] [facility];  Service: Cardiovascular;  Laterality: N/A;  . ABDOMINAL  AORTOGRAM W/LOWER EXTREMITY Right 08/07/2018   Procedure: ABDOMINAL AORTOGRAM W/LOWER EXTREMITY;  Surgeon: [name], MD;  Location: [place] [facility];  Service: Cardiovascular;  Laterality: Right;  . AMPUTATION Left 09/27/2013   Procedure: LEFT GREAT TOE AMPUTATION;  Surgeon: [name], MD;  Location: [place];  Service: Orthopedics;  Laterality: Left;  . AMPUTATION Right 08/15/2015   Procedure: Right Great Toe Amputation;  Surgeon: [name], MD;  Location: [place];  Service: Orthopedics;  Laterality: Right;  . AMPUTATION Left 11/05/2016   Procedure: TRANSMETATARSAL AMPUTATION LEFT FOOT;  Surgeon: [name], MD;  Location: [place];  Service: Orthopedics;  Laterality: Left;  . AMPUTATION Left 03/11/2017   Procedure: LEFT BELOW KNEE AMPUTATION;  Surgeon: [name], MD;  Location: [place];  Service: Orthopedics;  Laterality: Left;  . AMPUTATION Right 03/11/2017   Procedure: RIGHT 2ND TOE AMPUTATION;  Surgeon: [name], MD;  Location: [place];  Service: Orthopedics;  Laterality: Right;  . AMPUTATION Right 11/17/2018   Procedure: AMPUTATION BELOW KNEE;  Surgeon: [name], MD;  Location: [place];  Service: Vascular;  Laterality: Right;  . AV FISTULA PLACEMENT  left arm  . CORONARY ARTERY BYPASS GRAFT N/A 11/04/2017   Procedure: CORONARY ARTERY BYPASS GRAFTING (CABG) x three, using left internal mammary artery and right    leg greater saphenous vein;  Surgeon: Melrose Nakayama, MD;  Location: [place];  Service: Open Heart Surgery;  Laterality: N/A;  . FASCIOTOMY Right 08/07/2018   Procedure: FOUR COMPARTMENT FASCIOTOMY OF RIGHT LOWER LEG;  Surgeon: Rosetta Posner, MD;  Location: [place];  Service: Vascular;  Laterality: Right;  . FASCIOTOMY CLOSURE Right 08/10/2018   Procedure: FASCIOTOMY CLOSURE RIGHT LOWER EXTREMITY;  Surgeon: [name], MD;  Location: [place];  Service: Vascular;  Laterality: Right;  . HEMATOMA EVACUATION Right 08/07/2018   Procedure:  EVACUATION HEMATOMA;  Surgeon: Rosetta Posner, MD;  Location: [place];  Service: Vascular;  Laterality: Right;  . LEFT HEART CATH AND CORONARY ANGIOGRAPHY N/A 10/31/2017   Procedure: LEFT HEART CATH AND CORONARY ANGIOGRAPHY;  Surgeon: Troy Sine, MD;  Location: [place] [facility];  Service: Cardiovascular;  Laterality: N/A;  . LEFT HEART CATHETERIZATION WITH CORONARY ANGIOGRAM N/A 09/13/2014   Procedure: LEFT HEART CATHETERIZATION WITH CORONARY ANGIOGRAM;  Surgeon: Sinclair Grooms, MD;  Location: [facility] CATH LAB;  Service: Cardiovascular;  Laterality: N/A;  . LOWER EXTREMITY ANGIOGRAPHY Bilateral 11/02/2016   Procedure: Lower Extremity Angiography;  Surgeon: Waynetta Sandy, MD;  Location: [place] [facility];  Service: Cardiovascular;  Laterality: Bilateral;  . PERIPHERAL VASCULAR ATHERECTOMY Left 11/02/2016   Procedure: Peripheral Vascular Atherectomy;  Surgeon: Waynetta Sandy, MD;  Location: [place] [facility];  Service: Cardiovascular;  Laterality: Left;  PERONEAL  . PERIPHERAL VASCULAR ATHERECTOMY Right 08/07/2018   Procedure: PERIPHERAL VASCULAR ATHERECTOMY;  Surgeon: [name], MD;  Location: [place] [facility];  Service: Cardiovascular;  Laterality: Right;  Peroneal artery  . PERIPHERAL VASCULAR BALLOON ANGIOPLASTY Right 08/07/2018   Procedure: PERIPHERAL VASCULAR BALLOON ANGIOPLASTY;  Surgeon: [name], MD;  Location: [place] [facility];  Service: Cardiovascular;  Laterality: Right;  anterior tibial artery  . STUMP REVISION Left 01/11/2017   Procedure: Revision Left Transmetatarsal Amputation;  Surgeon: [name], MD;  Location: [place];  Service: Orthopedics;  Laterality: Left;  . TEE WITHOUT CARDIOVERSION N/A 11/04/2017   Procedure: TRANSESOPHAGEAL ECHOCARDIOGRAM (TEE);  Surgeon: Melrose Nakayama, MD;  Location: [place];  Service: Open Heart Surgery;  Laterality: N/A;  . TRANSMETATARSAL AMPUTATION Right 08/10/2018   Procedure: RIGHT FIFTH TOE  AMPUTATION;  Surgeon: [name], MD;  Location: [place];  Service: Vascular;  Laterality: Right;  . TRANSMETATARSAL AMPUTATION Right 09/14/2018   Procedure: TRANSMETATARSAL AMPUTATION;  Surgeon: [name], MD;  Location: [place];  Service: Vascular;  Laterality: Right;    Past Medical History:  Diagnosis Date  . Anemia   . Atherosclerosis of lower extremity ([place])   . Blood clot in vein    right calf  . CAD (coronary artery disease)   . Cataracts, bilateral   . Chronic combined systolic and diastolic heart failure ([place])   . Complication of anesthesia   . Depression   . ESRD (end stage renal disease) on dialysis [facility])    "MWF [employer]" (03/08/2017)  . GERD (gastroesophageal reflux disease)   . Heart murmur   . History of blood transfusion    "related to OR"  . Hypertension   . Myocardial infarction ([place])     " light"  . Nonhealing surgical wound    nonviable tissue  . PONV (postoperative nausea and vomiting)   . S/P unilateral BKA (below knee amputation), left ([place])   . Type II diabetes mellitus ([place])   . Wears glasses     Medications:  I have reviewed the patient's current medications.  (Not in a hospital admission)   ALLERGIES:   Allergies  Allergen Reactions  . Coconut Oil Anaphylaxis and Other (See Comments)    Can use topically, allergic to coconut foods     FAM HX: Family History  Problem Relation Age of Onset  . Heart failure Mother   . Hypertension Mother     Social History:   reports that he quit smoking about 19 months ago. His smoking use included cigarettes. He has a 13.00 pack-year smoking history. He has never used smokeless tobacco. He reports previous alcohol use. He reports current drug use. Drug: Marijuana.  ROS: 12 system ROS negative except per HPI above  Blood pressure (!) 166/115, pulse 89, temperature 98 F (36.7 C), resp. rate 14, height 6\' 3"  (1.905 m), weight 88 kg, SpO2 98 %. PHYSICAL EXAM: Gen: lying flat on  stretcher sleeping, arousable  Eyes: anicteric ENT: MMM Neck: supple CV:  RRR Abd: soft, nontender Lungs: normal WOB, clear ant and laterally GU: no foley Extr:  No edema on BL amputations, LUE AVF +t/b Skin: no rashes or lesions   Results for orders placed or performed during the hospital encounter of 06/23/20 (from the past 48 hour(s))  Comprehensive metabolic panel     Status: Abnormal   Collection Time: 06/23/20 10:25 AM  Result Value Ref Range   Sodium 133 (L) 135 - 145 mmol/L   Potassium 5.2 (H) 3.5 - 5.1 mmol/L    Comment: HEMOLYSIS AT THIS LEVEL MAY AFFECT RESULT   Chloride 103 98 - 111 mmol/L   CO2 9 (L) 22 - 32 mmol/L   Glucose, Bld 176 (H) 70 - 99 mg/dL    Comment: Glucose reference range applies only to samples taken after fasting for at least 8 hours.   BUN 83 (H) 6 - 20 mg/dL   Creatinine, Ser 20.55 (H) 0.61 - 1.24 mg/dL   Calcium 8.7 (L) 8.9 - 10.3 mg/dL   Total Protein 7.7 6.5 - 8.1 g/dL   Albumin 3.7 3.5 - 5.0 g/dL   AST 29 15 - 41 U/L   ALT 22 0 - 44 U/L   Alkaline Phosphatase 45 38 - 126 U/L   Total Bilirubin 1.0 0.3 - 1.2 mg/dL   GFR, Estimated 3 (L) >60 mL/min    Comment: (NOTE) Calculated using the CKD-EPI Creatinine Equation (2021)    Anion gap 21 (H) 5 - 15    Comment: Performed at [facility], [place] [address] [place], [place] [address]  CBC with Differential     Status: Abnormal   Collection Time: 06/23/20 10:25 AM  Result Value Ref Range   WBC 8.0 4.0 - 10.5 K/uL   RBC 4.30 4.22 - 5.81 MIL/uL   Hemoglobin 12.3 (L) 13.0 - 17.0 g/dL   HCT 38.8 (L) 39 - 52 %   MCV 90.2 80.0 - 100.0 fL   MCH 28.6 26.0 - 34.0 pg   MCHC 31.7 30.0 - 36.0 g/dL   RDW 17.0 (H) 11.5 - 15.5 %   Platelets 363 150 - 400 K/uL   nRBC 0.0 0.0 - 0.2 %   Neutrophils Relative % 68 %   Neutro Abs 5.5 1.7 - 7.7 K/uL   Lymphocytes Relative 19 %   Lymphs Abs 1.5 0.7 - 4.0 K/uL   Monocytes Relative 7 %   Monocytes Absolute 0.5 0.1 - 1.0 K/uL   Eosinophils Relative 6 %    Eosinophils Absolute  0.4 0.0 - 0.5 K/uL   Basophils Relative 0 %   Basophils Absolute 0.0 0.0 - 0.1 K/uL   Immature Granulocytes 0 %   Abs Immature Granulocytes 0.02 0.00 - 0.07 K/uL    Comment: Performed at [facility], [place] [address], [place], [place] [address]  Troponin I (High Sensitivity)     Status: Abnormal   Collection Time: 06/23/20 10:25 AM  Result Value Ref Range   Troponin I (High Sensitivity) 64 (H) <18 ng/L    Comment: (NOTE) Elevated high sensitivity troponin I (hsTnI) values and significant  changes across serial measurements may suggest ACS but many other  chronic and acute conditions are known to elevate hsTnI results.  Refer to the Links section for chest pain algorithms and additional  guidance. Performed at [facility], [place] [address], [place], [place] [address]   Troponin I (High Sensitivity)     Status: Abnormal   Collection Time: 06/23/20 12:39 PM  Result Value Ref Range   Troponin I (High Sensitivity) 71 (H) <18 ng/L    Comment: (NOTE) Elevated high sensitivity troponin I (hsTnI) values and significant  changes across serial measurements may suggest ACS but many other  chronic and acute conditions are known to elevate hsTnI results.  Refer to the "Links" section for chest pain algorithms and additional  guidance. Performed at [facility], [place] [address] [place], [place] [address]     DG Chest 2 View  Result Date: 06/23/2020 CLINICAL DATA:  Chest pain EXAM: CHEST - 2 VIEW COMPARISON:  03/10/2020 FINDINGS: Post CABG changes. The heart size and mediastinal contours are stable. Atherosclerotic calcification of the aortic knob. Both lungs are clear. The visualized skeletal structures are unremarkable. IMPRESSION: No active cardiopulmonary disease. Electronically Signed   By: Davina Poke D.O.   On: 06/23/2020 11:31   Dialysis orders:  [facility] MWF 200NR 500/800 2K/3Ca 4hrs EDW 76.5kg  AVF no AF since 03/2020 06/20/20 last tx ran 1:50 -  post wt 77.7, no recent full treatments No ESA 05/30/20 phos 8.5, Ca 7, PTH 137  Assessment/Plan **CP:  CAD s/p CABG.  CXR ok. By symptoms was GERD. Mild elevation Trop, trending per primary.   **ESRD on HD:  HD today per usual schedule.  UF 4-5L as tolerated - generally tolerates at outpt unit.  Daily wts. Fluid restriction.  **Anemia of CKD: Hb 12.3, not on ESA outpt  **BMM:  S/p PTX.  2.5Ca dialysate.  Calctriol 0.75 TIW.  On 2 tums QHS per outpt. Fosrenol 2 TID  **HTN:  Cont home meds + UF with HD.   **DM: per primary.   **AGMA: presumably due to ESRD, need for HD.  No e/o shock or ingestions by history.  Follow after HD.   Justin Mend 06/23/2020, 2:26 PM

## 2020-06-23 NOTE — ED Notes (Signed)
Pt reports no pain, no Dizziness, no nausea

## 2020-06-23 NOTE — ED Triage Notes (Signed)
Pt from home via EMS. Chest Pt woke from sleep  at 0300. Pt felt better  went back to bed. Apporx 0800 pt got up and ate something, then pain returned and Pt became nauseas and threw up. EMS gave 324 Asprin and 2 nitro. Pain subsidized upon arrival to hospital.

## 2020-06-23 NOTE — ED Provider Notes (Cosign Needed)
[name] Provider Note   CSN: [id number] Arrival date & time: 06/23/20  1014     History Chief Complaint  Patient presents with  . Chest Pain    [name] Crossan Sr. is a 44 y.o. male with past medical history significant for anemia, CAD, chronic combined systolic and diastolic heart failure, ESRD on dialysis MWF, GERD, MI, type 2 diabetes, bilateral BKA.  HPI Patient presents to emergency department today via EMS with chief complaint of sudden onset of chest pain.  He states the pain woke him up from his sleep approximately 7 hours prior to arrival.  Pain is located on the right side of his chest and radiated to his right arm.  He describes the pain as throbbing and sharp.  He was nauseous and had one episode of nonbloody nonbilious emesis prior to arrival.  He was given 324 of aspirin and 2 nitro in route.  He states his pain has since resolved.  States his last dialysis session was Friday. He was due to dialyze this morning, admits to missing last Monday also.  Admits to smoking marijuana, no other drug use.  He states his pain feels like when he had a heart attack in the past.     Past Medical History:  Diagnosis Date  . Anemia   . Atherosclerosis of lower extremity ([name])   . Blood clot in vein    right calf  . CAD (coronary artery disease)   . Cataracts, bilateral   . Chronic combined systolic and diastolic heart failure ([name])   . Complication of anesthesia   . Depression   . ESRD (end stage renal disease) on dialysis [facility])    "MWF [employer]" (03/08/2017)  . GERD (gastroesophageal reflux disease)   . Heart murmur   . History of blood transfusion    "related to OR"  . Hypertension   . Myocardial infarction ([place])     " light"  . Nonhealing surgical wound    nonviable tissue  . PONV (postoperative nausea and vomiting)   . S/P unilateral BKA (below knee amputation), left ([place])   . Type II diabetes mellitus ([name])   . Wears  glasses     Patient Active Problem List   Diagnosis Date Noted  . Acute on chronic anemia   . Unilateral complete BKA, right, initial encounter ([name])   . Diabetes mellitus type 2 in nonobese (HCC)   . Bacteremia   . Postoperative pain   . S/P bilateral below knee amputation ([place]) 11/23/2018  . Non-healing wound of lower extremity 11/17/2018  . MRSA bacteremia 11/04/2018  . Infection of amputation stump, right lower extremity ([place]) 11/04/2018  . Non-healing surgical wound 09/14/2018  . Chest pain, rule out acute myocardial infarction 09/07/2018  . Gangrene of toe of left foot ([name]) 08/05/2018  . 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[name]) 08/05/2018  . Gangrene of foot ([name]) 08/05/2018  . 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 ACS (acute coronary syndrome) ([place]) 10/28/2017  . Flash pulmonary edema ([place]) 06/04/2017  . Hypertensive emergency 06/04/2017  . 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 S/P BKA (below knee amputation) unilateral, left ([place])   . Post-operative pain   . Acute blood loss anemia   . Chronic combined systolic and diastolic CHF (congestive heart failure) ([place])   . PVD (peripheral  vascular disease) ([place])   . Coronary artery disease involving native coronary artery of native heart with unstable angina pectoris ([place])   . Tobacco abuse   . Diabetic foot infection ([place]) 03/08/2017  . Osteomyelitis ([place]) 03/08/2017  . Wound dehiscence   . Dehiscence of amputation stump ([place]) 01/04/2017  . Gangrene of right foot ([name]) 08/02/2016  . Achilles tendon contracture, left 08/02/2016  . Anemia of renal disease 08/16/2015  . Wound infection 08/15/2015  . Diabetic ulcer of right great toe ([place]) 08/15/2015  . Pain in the chest   . Elevated troponin   . Essential hypertension   . ESRD on dialysis ([place]) 08/07/2013  . NSTEMI (non-ST elevated  myocardial infarction) ([place]) 05/16/2012  . Murmur 05/16/2012  . Renal disorder     Past Surgical History:  Procedure Laterality Date  . ABDOMINAL AORTOGRAM N/A 11/02/2016   Procedure: Abdominal Aortogram;  Surgeon: Waynetta Sandy, MD;  Location: [facility] CV LAB;  Service: Cardiovascular;  Laterality: N/A;  . ABDOMINAL AORTOGRAM W/LOWER EXTREMITY Right 08/07/2018   Procedure: ABDOMINAL AORTOGRAM W/LOWER EXTREMITY;  Surgeon: [name], MD;  Location: [place] [place] [facility];  Service: Cardiovascular;  Laterality: Right;  . AMPUTATION Left 09/27/2013   Procedure: LEFT GREAT TOE AMPUTATION;  Surgeon: [name], MD;  Location: [name];  Service: Orthopedics;  Laterality: Left;  . AMPUTATION Right 08/15/2015   Procedure: Right Great Toe Amputation;  Surgeon: [name], MD;  Location: [facility];  Service: Orthopedics;  Laterality: Right;  . AMPUTATION Left 11/05/2016   Procedure: TRANSMETATARSAL AMPUTATION LEFT FOOT;  Surgeon: [name], MD;  Location: [facility];  Service: Orthopedics;  Laterality: Left;  . AMPUTATION Left 03/11/2017   Procedure: LEFT BELOW KNEE AMPUTATION;  Surgeon: [name], MD;  Location: [facility];  Service: Orthopedics;  Laterality: Left;  . AMPUTATION Right 03/11/2017   Procedure: RIGHT 2ND TOE AMPUTATION;  Surgeon: [name], MD;  Location: [place];  Service: Orthopedics;  Laterality: Right;  . AMPUTATION Right 11/17/2018   Procedure: AMPUTATION BELOW KNEE;  Surgeon: [name], MD;  Location: [facility] [place];  Service: Vascular;  Laterality: Right;  . AV FISTULA PLACEMENT  left arm  . CORONARY ARTERY BYPASS GRAFT N/A 11/04/2017   Procedure: CORONARY ARTERY BYPASS GRAFTING (CABG) x three, using left internal mammary artery and right    leg greater saphenous vein;  Surgeon: Melrose Nakayama, MD;  Location: [facility];  Service: Open Heart Surgery;  Laterality: N/A;  . FASCIOTOMY Right 08/07/2018   Procedure: FOUR COMPARTMENT FASCIOTOMY OF RIGHT LOWER LEG;   Surgeon: Rosetta Posner, MD;  Location: [facility];  Service: Vascular;  Laterality: Right;  . FASCIOTOMY CLOSURE Right 08/10/2018   Procedure: FASCIOTOMY CLOSURE RIGHT LOWER EXTREMITY;  Surgeon: [name], MD;  Location: [place] [facility];  Service: Vascular;  Laterality: Right;  . HEMATOMA EVACUATION Right 08/07/2018   Procedure: EVACUATION HEMATOMA;  Surgeon: Rosetta Posner, MD;  Location: [facility];  Service: Vascular;  Laterality: Right;  . LEFT HEART CATH AND CORONARY ANGIOGRAPHY N/A 10/31/2017   Procedure: LEFT HEART CATH AND CORONARY ANGIOGRAPHY;  Surgeon: Troy Sine, MD;  Location: [facility] CV LAB;  Service: Cardiovascular;  Laterality: N/A;  . LEFT HEART CATHETERIZATION WITH CORONARY ANGIOGRAM N/A 09/13/2014   Procedure: LEFT HEART CATHETERIZATION WITH CORONARY ANGIOGRAM;  Surgeon: Sinclair Grooms, MD;  Location: [facility] CATH LAB;  Service: Cardiovascular;  Laterality: N/A;  . LOWER EXTREMITY ANGIOGRAPHY Bilateral 11/02/2016  Procedure: Lower Extremity Angiography;  Surgeon: Waynetta Sandy, MD;  Location: [facility] CV LAB;  Service: Cardiovascular;  Laterality: Bilateral;  . PERIPHERAL VASCULAR ATHERECTOMY Left 11/02/2016   Procedure: Peripheral Vascular Atherectomy;  Surgeon: Waynetta Sandy, MD;  Location: [facility] CV LAB;  Service: Cardiovascular;  Laterality: Left;  PERONEAL  . PERIPHERAL VASCULAR ATHERECTOMY Right 08/07/2018   Procedure: PERIPHERAL VASCULAR ATHERECTOMY;  Surgeon: [name], MD;  Location: [facility] CV LAB;  Service: Cardiovascular;  Laterality: Right;  Peroneal artery  . PERIPHERAL VASCULAR BALLOON ANGIOPLASTY Right 08/07/2018   Procedure: PERIPHERAL VASCULAR BALLOON ANGIOPLASTY;  Surgeon: [name], MD;  Location: [place] CV LAB;  Service: Cardiovascular;  Laterality: Right;  anterior tibial artery  . STUMP REVISION Left 01/11/2017   Procedure: Revision Left Transmetatarsal Amputation;  Surgeon: [name], MD;  Location:  [facility];  Service: Orthopedics;  Laterality: Left;  . TEE WITHOUT CARDIOVERSION N/A 11/04/2017   Procedure: TRANSESOPHAGEAL ECHOCARDIOGRAM (TEE);  Surgeon: Melrose Nakayama, MD;  Location: [facility];  Service: Open Heart Surgery;  Laterality: N/A;  . TRANSMETATARSAL AMPUTATION Right 08/10/2018   Procedure: RIGHT FIFTH TOE AMPUTATION;  Surgeon: [name], MD;  Location: [facility];  Service: Vascular;  Laterality: Right;  . TRANSMETATARSAL AMPUTATION Right 09/14/2018   Procedure: TRANSMETATARSAL AMPUTATION;  Surgeon: [name], MD;  Location[facility];  Service: Vascular;  Laterality: Right;       Family History  Problem Relation Age of Onset  . Heart failure Mother   . Hypertension Mother     Social History   Tobacco Use  . Smoking status: Former Smoker    Packs/day: 0.50    Years: 26.00    Pack years: 13.00    Types: Cigarettes    Quit date: 10/25/2018    Years since quitting: 1.6  . Smokeless tobacco: Never Used  Vaping Use  . Vaping Use: Never used  Substance Use Topics  . Alcohol use: Not Currently    Comment:  "haven't took a drink in over 5 years"  . Drug use: Yes    Types: Marijuana    Comment: occasional    Home Medications Prior to Admission medications   Medication Sig Start Date End Date Taking? Authorizing Provider  acetaminophen (TYLENOL) 325 MG tablet Take 2 tablets (650 mg total) by mouth every 4 (four) hours as needed for headache or mild pain. 09/08/18  Yes Geradine Girt, DO  aspirin EC 81 MG tablet Take 1 tablet (81 mg total) by mouth daily. 02/07/18  Yes Burtis Junes, NP  atorvastatin (LIPITOR) 80 MG tablet Take 1 tablet (80 mg total) by mouth daily at 6 PM. Patient taking differently: Take 80 mg by mouth daily.  11/28/18  Yes Angiulli, Lavon Paganini, PA-C  calcitRIOL (ROCALTROL) 0.25 MCG capsule Take 5 capsules (1.25 mcg total) by mouth every Monday, Wednesday, and Friday with hemodialysis. 11/09/17  Yes Harriet Pho, Tessa N, PA-C  calcium carbonate  (TUMS) 500 MG chewable tablet Chew 500 mg by mouth daily. 04/25/20  Yes [provider]  carvedilol (COREG) 3.125 MG tablet Take 3.125 mg by mouth 2 (two) times daily. 06/14/20  Yes [provider]  clopidogrel (PLAVIX) 75 MG tablet Take 1 tablet (75 mg total) by mouth daily. 11/28/18  Yes Angiulli, Lavon Paganini, PA-C  Darbepoetin Alfa (ARANESP) 200 MCG/0.4ML SOSY injection Inject 0.4 mLs (200 mcg total) into the vein every Monday with hemodialysis. 12/04/18  Yes Angiulli, Lavon Paganini, PA-C  esomeprazole (NEXIUM) 40 MG capsule Take 40 mg by mouth daily. 01/17/20  Yes [provider]  isosorbide mononitrate (IMDUR) 30 MG 24 hr tablet Take 1 tablet (30 mg total) by mouth daily. 11/28/18  Yes Angiulli, Lavon Paganini, PA-C  lanthanum (FOSRENOL) 1000 MG chewable tablet Chew 2 tablets (2,000 mg total) by mouth 3 (three) times daily with meals. 11/28/18  Yes Angiulli, Lavon Paganini, PA-C  methocarbamol (ROBAXIN) 500 MG tablet Take 1,000 mg by mouth 4 (four) times daily as needed for muscle spasms. 06/17/20  Yes [provider]  multivitamin (RENA-VIT) TABS tablet Take 1 tablet by mouth at bedtime. Patient taking differently: Take 1 tablet by mouth daily.  08/17/18  Yes Kayleen Memos, DO  nitroGLYCERIN (NITROSTAT) 0.4 MG SL tablet Place 1 tablet (0.4 mg total) under the tongue every 5 (five) minutes as needed for chest pain. 03/20/19 06/23/20 Yes Josue Hector, MD  insulin aspart (NOVOLOG) 100 UNIT/ML FlexPen Inject 3 Units into the skin 3 (three) times daily with meals. Patient not taking: Reported on 03/10/2020 11/28/18   Angiulli, Lavon Paganini, PA-C  Insulin Glargine (LANTUS) 100 UNIT/ML Solostar Pen Inject 14 Units into the skin at bedtime. Patient not taking: Reported on 03/10/2020 11/28/18   Angiulli, Lavon Paganini, PA-C  oxyCODONE-acetaminophen (PERCOCET) 5-325 MG tablet Take 1 tablet by mouth every 4 (four) hours as needed for severe pain. Patient not taking: Reported on 06/23/2020 03/10/20   Daleen Bo, MD    Allergies    Coconut oil  Review of Systems   Review of Systems All other systems are reviewed and are negative for acute change except as noted in the HPI.  Physical Exam Updated Vital Signs BP (!) 147/56 (BP Location: Right Arm)   Pulse 78   Temp 98 F (36.7 C)   Resp 15   Ht 6\' 3"  (1.905 m)   Wt 88 kg   SpO2 (!) 17%   BMI 24.25 kg/m   Physical Exam Vitals and nursing note reviewed.  Constitutional:      General: He is not in acute distress.    Appearance: He is not ill-appearing.  HENT:     Head: Normocephalic and atraumatic.     Right Ear: Tympanic membrane and external ear normal.     Left Ear: Tympanic membrane and external ear normal.     Nose: Nose normal.     Mouth/Throat:     Mouth: Mucous membranes are moist.     Pharynx: Oropharynx is clear.  Eyes:     General: No scleral icterus.       Right eye: No discharge.        Left eye: No discharge.     Extraocular Movements: Extraocular movements intact.     Conjunctiva/sclera: Conjunctivae normal.     Pupils: Pupils are equal, round, and reactive to light.  Neck:     Vascular: No JVD.  Cardiovascular:     Rate and Rhythm: Normal rate and regular rhythm.     Pulses: Normal pulses.          Radial pulses are 2+ on the right side and 2+ on the left side.     Heart sounds: Normal heart sounds.  Pulmonary:     Comments: Lungs clear to auscultation in all fields. Symmetric chest rise. No wheezing, rales, or rhonchi. Chest:     Chest wall: No tenderness.  Abdominal:     Comments: Abdomen is soft, non-distended, and non-tender in all quadrants. No  rigidity, no guarding. No peritoneal signs.  Musculoskeletal:        General: Normal range of motion.     Cervical back: Normal range of motion.     Comments: Bilateral BKA  Skin:    General: Skin is warm and dry.     Capillary Refill: Capillary refill takes less than 2 seconds.  Neurological:     Mental Status: He is oriented to person, place,  and time.     GCS: GCS eye subscore is 4. GCS verbal subscore is 5. GCS motor subscore is 6.     Comments: Fluent speech, no facial droop.  Psychiatric:        Behavior: Behavior normal.     ED Results / Procedures / Treatments   Labs (all labs ordered are listed, but only abnormal results are displayed) Labs Reviewed  COMPREHENSIVE METABOLIC PANEL - Abnormal; Notable for the following components:      Result Value   Sodium 133 (*)    Potassium 5.2 (*)    CO2 9 (*)    Glucose, Bld 176 (*)    BUN 83 (*)    Creatinine, Ser 20.55 (*)    Calcium 8.7 (*)    GFR, Estimated 3 (*)    Anion gap 21 (*)    All other components within normal limits  CBC WITH DIFFERENTIAL/PLATELET - Abnormal; Notable for the following components:   Hemoglobin 12.3 (*)    HCT 38.8 (*)    RDW 17.0 (*)    All other components within normal limits  TROPONIN I (HIGH SENSITIVITY) - Abnormal; Notable for the following components:   Troponin I (High Sensitivity) 64 (*)    All other components within normal limits  TROPONIN I (HIGH SENSITIVITY) - Abnormal; Notable for the following components:   Troponin I (High Sensitivity) 71 (*)    All other components within normal limits  RESP PANEL BY RT PCR (RSV, FLU A&B, COVID)    EKG EKG Interpretation  Date/Time:  Monday June 23 2020 10:17:11 EST Ventricular Rate:  81 PR Interval:    QRS Duration: 104 QT Interval:  432 QTC Calculation: 502 R Axis:   35 Text Interpretation: Sinus rhythm Probable left atrial enlargement Nonspecific T abnrm, anterolateral leads Prolonged QT interval Confirmed by Pattricia Boss [phone]) on 06/23/2020 11:01:34 AM   Radiology DG Chest 2 View  Result Date: 06/23/2020 CLINICAL DATA:  Chest pain EXAM: CHEST - 2 VIEW COMPARISON:  03/10/2020 FINDINGS: Post CABG changes. The heart size and mediastinal contours are stable. Atherosclerotic calcification of the aortic knob. Both lungs are clear. The visualized skeletal structures are  unremarkable. IMPRESSION: No active cardiopulmonary disease. Electronically Signed   By: Davina Poke D.O.   On: 06/23/2020 11:31    Procedures Procedures (including critical care time)  Medications Ordered in ED Medications  lanthanum (FOSRENOL) chewable tablet 2,000 mg (has no administration in time range)  calcitRIOL (ROCALTROL) capsule 0.75 mcg (has no administration in time range)  calcium carbonate (TUMS - dosed in mg elemental calcium) chewable tablet 400 mg of elemental calcium (has no administration in time range)  Chlorhexidine Gluconate Cloth 2 % PADS 6 each (has no administration in time range)  LORazepam (ATIVAN) injection 0.5 mg (0.5 mg Intravenous Given 06/23/20 1325)  sodium chloride 0.9 % bolus 1,000 mL (1,000 mLs Intravenous New Bag/Given 06/23/20 1324)    ED Course  I have reviewed the triage vital signs and the nursing notes.  Pertinent labs & imaging results that  were available during my care of the patient were reviewed by me and considered in my medical decision making (see chart for details).    MDM Rules/Calculators/A&P                          History provided by patient and significant other with additional history obtained from chart review.    44 yo male presenting with sudden onset of chest pain. On ED arrival he is well appearing, in no acute distress. He is resting comfortable. Lungs are clear to auscultation in all fields. Normal work of breathing. Does not appear volume overloaded. CBC shows no leukocytosis, anemia consistent with baseline. CMP shows hyperkalemia 5.2 with slight hemolysis, metabolic acidosis with bicarb of 9 and elevated anion gap at 21, also elevated BUN/Creatinine 83/20.55 which appears higher than normal compared to previous labs. I viewed pt's chest xray and it does not suggest acute infectious processes. EKG shows sinus rhythm with prolonged QT at 432.Concern for cardiac etiology of Chest Pain. Troponins today elevated 64 and 71.  Appear higher than in the past. Covid and influenza tests are negative. Pt has been re-evaluated prior to consult and VSS, NAD, heart RRR, pain 0/10, lungs CTAB. This case was discussed with Dr. Jeanell Sparrow who  agrees with plan to admit for dialysis and CP r/o. Consulted on call nephrologist Dr. Johnney Ou as patient will need dialysis. Nephrology to see patient and order appropriate interventions. Spoke with Dr. Roosevelt Locks with hospitalist service who agrees to assume care of patient and bring into the hospital for further evaluation and management.     Portions of this note were generated with [employer]. Dictation errors may occur despite best attempts at proofreading.   Final Clinical Impression(s) / ED Diagnoses Final diagnoses:  Chest pain, unspecified type  Hyperkalemia    Rx / DC Orders ED Discharge Orders    None       Barrie Folk, PA-C 06/23/20 1510

## 2020-06-24 NOTE — Progress Notes (Signed)
pt refusal to complete tx signed off AMA  completion of 3hours 8 min states tired and wants to get of HD machine.

## 2020-08-22 IMAGING — MR MR FOOT*R* W/O CM
6 series · 40 of 40 positions shown · non-contrast
Comparison: Radiographs 08/04/2018

Addendum:
CLINICAL DATA: Right foot pain.  Dry gangrene of the small toe.

EXAM:
MRI OF THE RIGHT FOREFOOT WITHOUT CONTRAST
TECHNIQUE: Multiplanar, multisequence MR imaging of the right forefoot was
performed. No intravenous contrast was administered.

[Series 4: STIR · sagittal · right · 3.0mm · 0.70mm/px · 6 of 30 slices shown]
[im 1/30]
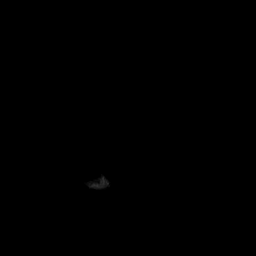
[im 6/30]
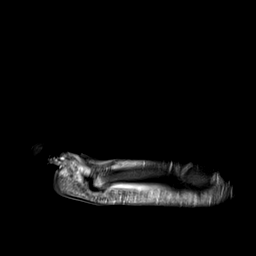
[im 12/30]
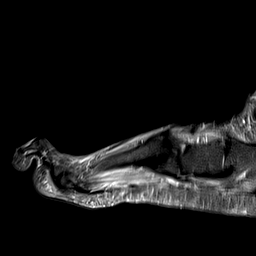
[im 18/30]
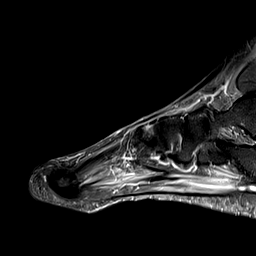
[im 24/30]
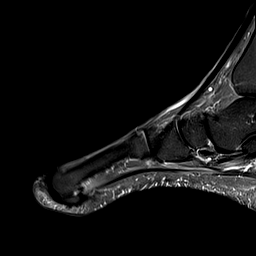
[im 30/30]
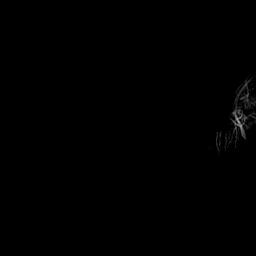

[Series 5: T2 fat-sat · coronal · right · 3.0mm · 0.38mm/px · 8 of 46 slices shown (1 of 2)]
[im 1/46]
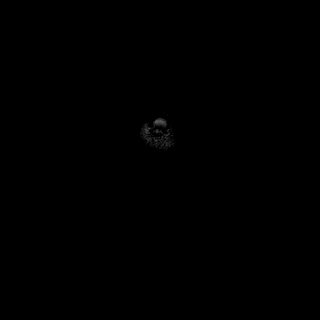
[im 7/46]
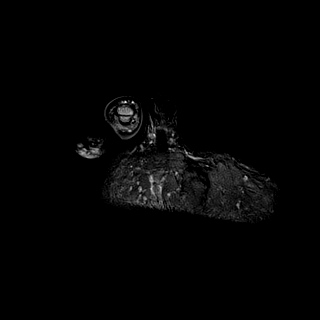
[im 13/46]
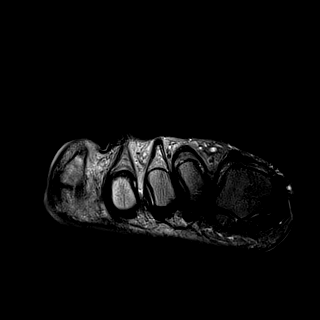
[im 20/46]
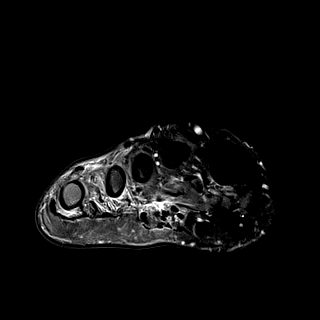
[im 26/46]
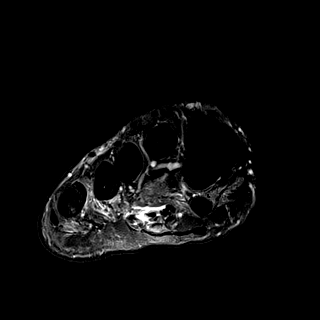
[im 33/46]
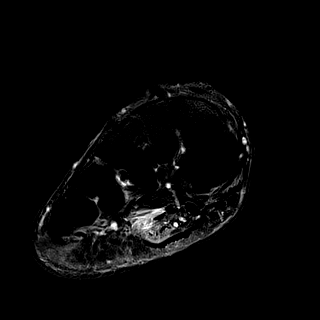
[im 39/46]
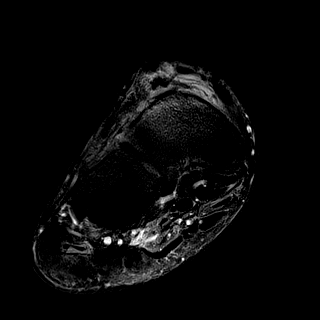
[im 46/46]
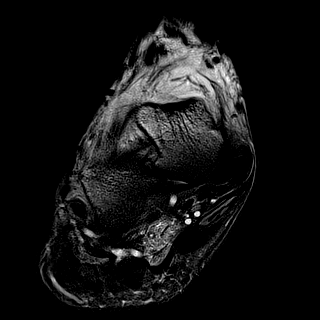

[Series 6: T1 · coronal · right · 3.0mm · 0.38mm/px · 8 of 46 slices shown (1 of 3)]
[im 1/46]
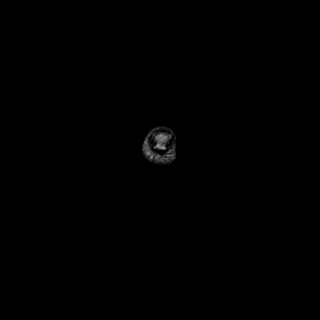
[im 7/46]
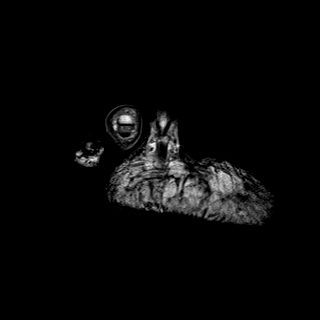
[im 13/46]
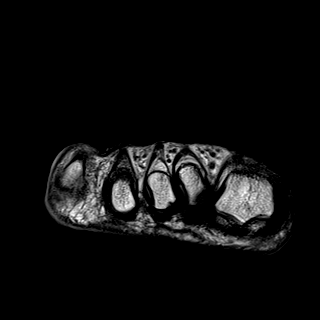
[im 20/46]
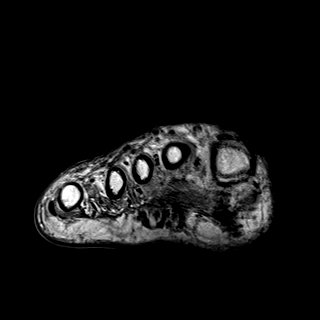
[im 26/46]
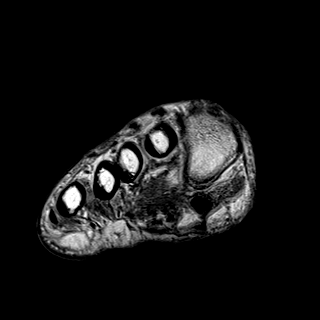
[im 33/46]
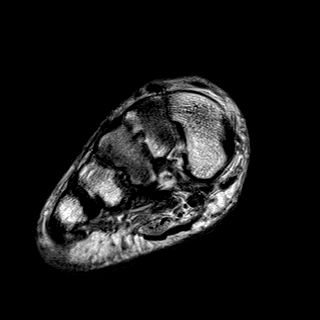
[im 39/46]
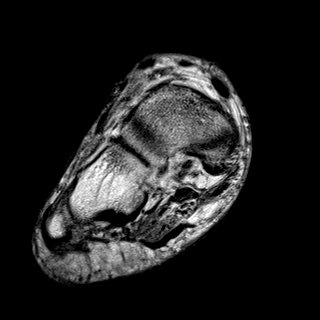
[im 46/46]
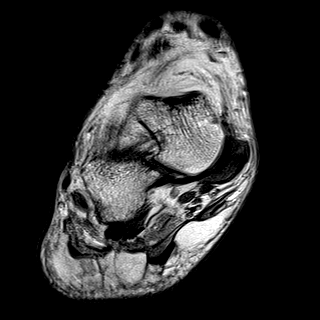

[Series 7: T1 · axial · right · 3.0mm · 0.50mm/px · z∈[-117,-21]mm · 5 of 28 slices shown (2 of 3)]
[im 1/28]
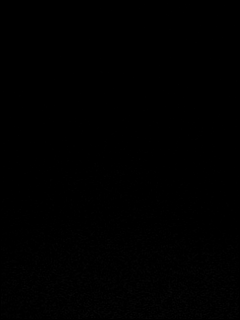
[im 7/28]
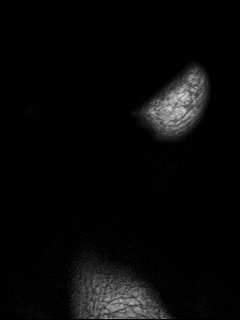
[im 14/28]
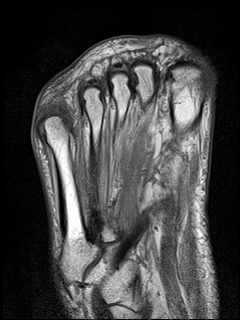
[im 21/28]
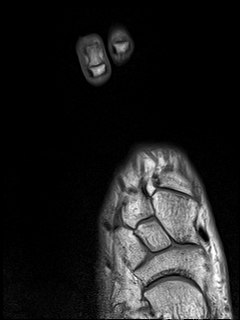
[im 28/28]
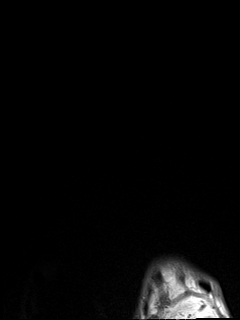

[Series 8: T2 fat-sat · axial · right · 3.0mm · 0.50mm/px · z∈[-120,-24]mm · 5 of 28 slices shown (2 of 2)]
[im 1/28]
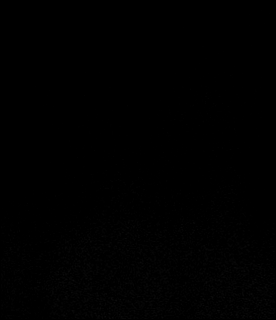
[im 7/28]
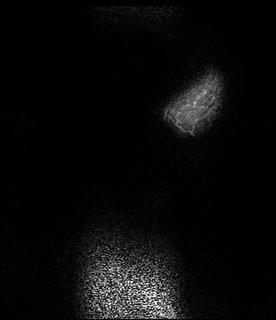
[im 14/28]
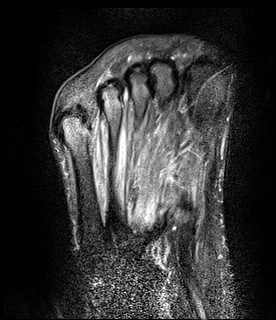
[im 21/28]
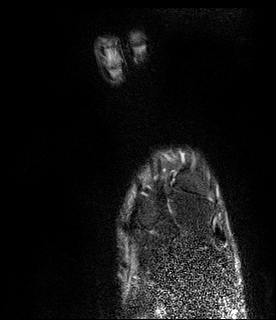
[im 28/28]
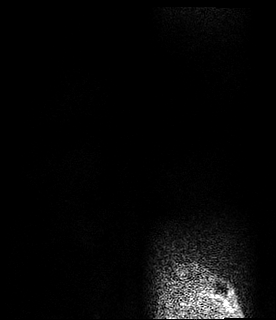

[Series 9: T1 · coronal · right · 3.0mm · 0.47mm/px · 8 of 45 slices shown (3 of 3)]
[im 1/45]
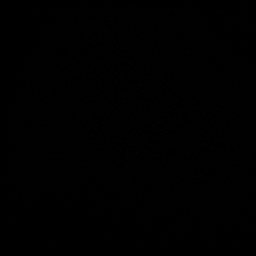
[im 7/45]
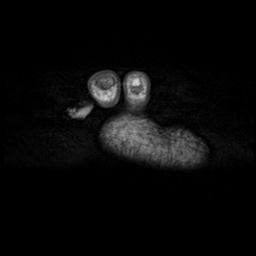
[im 13/45]
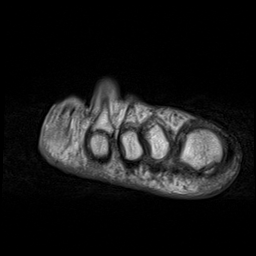
[im 19/45]
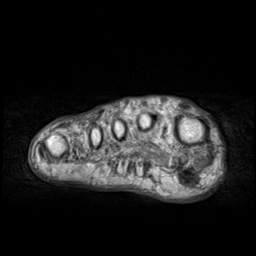
[im 26/45]
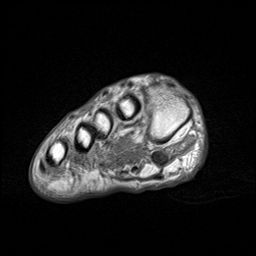
[im 32/45]
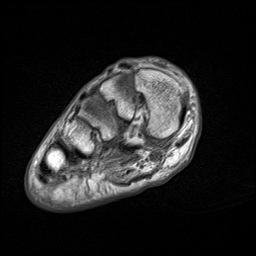
[im 38/45]
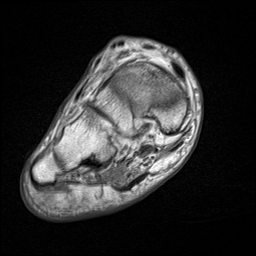
[im 45/45]
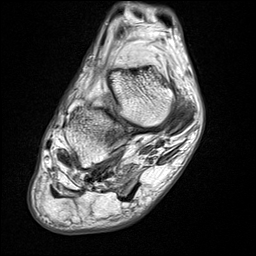

[40 of 40 positions shown; findings below may reference images not displayed]

FINDINGS: Despite efforts by the technologist and patient, motion artifact is
present on today's exam and could not be eliminated. This reduces
exam sensitivity and specificity.

Bones/Joint/Cartilage

Prior amputation of first and second toes.

Accentuated T2 and low T1 signal in the phalanges of the small toe
favoring osteomyelitis. Small amount of edema along the medial
aspect of the head of the fifth metatarsal could also reflect early
osteomyelitis.

Reduced T1 signal in the middle and distal phalanges of the fourth
toe, suspicious for osteomyelitis.

Ligaments

Lisfranc ligament intact.

Muscles and Tendons

Low-level edema tracks within along the plantar musculature of the
foot, notably the quadratus plantae a and flexor digitorum brevis,
more likely neurogenic than from myositis. Generalized muscular
atrophy. Expansion and some laxity flexor hallucis longus, which is
likely discontinuous distally due to the amputation.

Soft tissues

Poor definition of the soft tissues along the dorsum of the fifth
toe.
IMPRESSION: 1. Suspected osteomyelitis involving the phalanges of the fifth toe
and possibly in the head of the fifth metatarsal; and also the
middle and distal phalanges of the fourth toe. Suspected involvement
of the fourth toe is supplemental to the original preliminary
report. Radiology assistant personnel have been notified to put me
in telephone contact with the referring physician or the referring
physician's clinical representative in order to discuss these
findings. Once this communication is established I will issue an
addendum to this report for documentation purposes.
2. Poor definition of the soft tissues of the dorsum of the fifth
toe, potentially from either artifact or from actual
erosions/absence of tissue.
3. Extensive motion artifact.  Poor fat saturation in the toes.

ADDENDUM:
These results were called by telephone at the time of interpretation
on 08/06/2018 at [DATE] to Dr. JUMPER, who verbally acknowledged
these results.

*** End of Addendum ***

## 2021-07-31 ENCOUNTER — Other Ambulatory Visit: Payer: Self-pay

## 2021-07-31 ENCOUNTER — Inpatient Hospital Stay (HOSPITAL_COMMUNITY)
Admission: EM | Admit: 2021-07-31 | Discharge: 2021-08-04 | DRG: 377 | Disposition: A | Discharge status: Home / Self Care | Payer: Medicaid Other | Attending: Internal Medicine | Admitting: Internal Medicine

## 2021-07-31 ENCOUNTER — Encounter (HOSPITAL_COMMUNITY): Payer: Self-pay | Admitting: Emergency Medicine

## 2021-07-31 ENCOUNTER — Emergency Department (HOSPITAL_COMMUNITY): Payer: Medicaid Other

## 2021-07-31 DIAGNOSIS — R34 Anuria and oliguria: Secondary | ICD-10-CM | POA: Diagnosis present

## 2021-07-31 DIAGNOSIS — D631 Anemia in chronic kidney disease: Secondary | ICD-10-CM | POA: Diagnosis present

## 2021-07-31 DIAGNOSIS — Z794 Long term (current) use of insulin: Secondary | ICD-10-CM

## 2021-07-31 DIAGNOSIS — Z89511 Acquired absence of right leg below knee: Secondary | ICD-10-CM

## 2021-07-31 DIAGNOSIS — Z87891 Personal history of nicotine dependence: Secondary | ICD-10-CM

## 2021-07-31 DIAGNOSIS — M898X9 Other specified disorders of bone, unspecified site: Secondary | ICD-10-CM | POA: Diagnosis present

## 2021-07-31 DIAGNOSIS — I70203 Unspecified atherosclerosis of native arteries of extremities, bilateral legs: Secondary | ICD-10-CM | POA: Diagnosis present

## 2021-07-31 DIAGNOSIS — I248 Other forms of acute ischemic heart disease: Secondary | ICD-10-CM | POA: Diagnosis present

## 2021-07-31 DIAGNOSIS — I25118 Atherosclerotic heart disease of native coronary artery with other forms of angina pectoris: Secondary | ICD-10-CM

## 2021-07-31 DIAGNOSIS — E1051 Type 1 diabetes mellitus with diabetic peripheral angiopathy without gangrene: Secondary | ICD-10-CM | POA: Diagnosis present

## 2021-07-31 DIAGNOSIS — N186 End stage renal disease: Secondary | ICD-10-CM

## 2021-07-31 DIAGNOSIS — N2581 Secondary hyperparathyroidism of renal origin: Secondary | ICD-10-CM | POA: Diagnosis present

## 2021-07-31 DIAGNOSIS — E1029 Type 1 diabetes mellitus with other diabetic kidney complication: Secondary | ICD-10-CM | POA: Diagnosis present

## 2021-07-31 DIAGNOSIS — I5042 Chronic combined systolic (congestive) and diastolic (congestive) heart failure: Secondary | ICD-10-CM | POA: Diagnosis present

## 2021-07-31 DIAGNOSIS — R079 Chest pain, unspecified: Secondary | ICD-10-CM | POA: Diagnosis not present

## 2021-07-31 DIAGNOSIS — E1065 Type 1 diabetes mellitus with hyperglycemia: Secondary | ICD-10-CM | POA: Diagnosis present

## 2021-07-31 DIAGNOSIS — Z20822 Contact with and (suspected) exposure to covid-19: Secondary | ICD-10-CM | POA: Diagnosis present

## 2021-07-31 DIAGNOSIS — Z951 Presence of aortocoronary bypass graft: Secondary | ICD-10-CM | POA: Diagnosis not present

## 2021-07-31 DIAGNOSIS — K920 Hematemesis: Secondary | ICD-10-CM

## 2021-07-31 DIAGNOSIS — R739 Hyperglycemia, unspecified: Secondary | ICD-10-CM

## 2021-07-31 DIAGNOSIS — Z992 Dependence on renal dialysis: Secondary | ICD-10-CM

## 2021-07-31 DIAGNOSIS — D62 Acute posthemorrhagic anemia: Secondary | ICD-10-CM | POA: Diagnosis present

## 2021-07-31 DIAGNOSIS — R778 Other specified abnormalities of plasma proteins: Secondary | ICD-10-CM | POA: Diagnosis present

## 2021-07-31 DIAGNOSIS — R072 Precordial pain: Secondary | ICD-10-CM

## 2021-07-31 DIAGNOSIS — E785 Hyperlipidemia, unspecified: Secondary | ICD-10-CM | POA: Diagnosis present

## 2021-07-31 DIAGNOSIS — K219 Gastro-esophageal reflux disease without esophagitis: Secondary | ICD-10-CM | POA: Diagnosis present

## 2021-07-31 DIAGNOSIS — K2980 Duodenitis without bleeding: Secondary | ICD-10-CM | POA: Diagnosis present

## 2021-07-31 DIAGNOSIS — F32A Depression, unspecified: Secondary | ICD-10-CM | POA: Diagnosis present

## 2021-07-31 DIAGNOSIS — Z89512 Acquired absence of left leg below knee: Secondary | ICD-10-CM

## 2021-07-31 DIAGNOSIS — I2510 Atherosclerotic heart disease of native coronary artery without angina pectoris: Secondary | ICD-10-CM | POA: Diagnosis not present

## 2021-07-31 DIAGNOSIS — D649 Anemia, unspecified: Secondary | ICD-10-CM

## 2021-07-31 DIAGNOSIS — Z9714 Presence of artificial left leg (complete) (partial): Secondary | ICD-10-CM

## 2021-07-31 DIAGNOSIS — Z79899 Other long term (current) drug therapy: Secondary | ICD-10-CM

## 2021-07-31 DIAGNOSIS — G4700 Insomnia, unspecified: Secondary | ICD-10-CM | POA: Diagnosis present

## 2021-07-31 DIAGNOSIS — I132 Hypertensive heart and chronic kidney disease with heart failure and with stage 5 chronic kidney disease, or end stage renal disease: Secondary | ICD-10-CM | POA: Diagnosis present

## 2021-07-31 DIAGNOSIS — Z8249 Family history of ischemic heart disease and other diseases of the circulatory system: Secondary | ICD-10-CM

## 2021-07-31 DIAGNOSIS — E1022 Type 1 diabetes mellitus with diabetic chronic kidney disease: Secondary | ICD-10-CM | POA: Diagnosis not present

## 2021-07-31 DIAGNOSIS — Z7902 Long term (current) use of antithrombotics/antiplatelets: Secondary | ICD-10-CM

## 2021-07-31 DIAGNOSIS — K922 Gastrointestinal hemorrhage, unspecified: Secondary | ICD-10-CM

## 2021-07-31 DIAGNOSIS — K3182 Dieulafoy lesion (hemorrhagic) of stomach and duodenum: Principal | ICD-10-CM | POA: Diagnosis present

## 2021-07-31 DIAGNOSIS — I252 Old myocardial infarction: Secondary | ICD-10-CM

## 2021-07-31 DIAGNOSIS — Z7982 Long term (current) use of aspirin: Secondary | ICD-10-CM

## 2021-07-31 LAB — COMPREHENSIVE METABOLIC PANEL
ALT: 17 U/L (ref 0–44)
AST: 18 U/L (ref 15–41)
Albumin: 2.9 g/dL — ABNORMAL LOW (ref 3.5–5.0)
Alkaline Phosphatase: 38 U/L (ref 38–126)
Anion gap: 13 (ref 5–15)
BUN: 94 mg/dL — ABNORMAL HIGH (ref 6–20)
CO2: 25 mmol/L (ref 22–32)
Calcium: 8.5 mg/dL — ABNORMAL LOW (ref 8.9–10.3)
Chloride: 90 mmol/L — ABNORMAL LOW (ref 98–111)
Creatinine, Ser: 9.65 mg/dL — ABNORMAL HIGH (ref 0.61–1.24)
GFR, Estimated: 6 mL/min — ABNORMAL LOW (ref >60)
Glucose, Bld: 562 mg/dL (ref 70–99)
Potassium: 3.3 mmol/L — ABNORMAL LOW (ref 3.5–5.1)
Sodium: 128 mmol/L — ABNORMAL LOW (ref 135–145)
Total Bilirubin: 0.6 mg/dL (ref 0.3–1.2)
Total Protein: 6.3 g/dL — ABNORMAL LOW (ref 6.5–8.1)

## 2021-07-31 LAB — CBC
HCT: 19.6 % — ABNORMAL LOW (ref 39.0–52.0)
Hemoglobin: 6.7 g/dL — CL (ref 13.0–17.0)
MCH: 28.2 pg (ref 26.0–34.0)
MCHC: 34.2 g/dL (ref 30.0–36.0)
MCV: 82.4 fL (ref 80.0–100.0)
Platelets: 320 10*3/uL (ref 150–400)
RBC: 2.38 MIL/uL — ABNORMAL LOW (ref 4.22–5.81)
RDW: 16.9 % — ABNORMAL HIGH (ref 11.5–15.5)
WBC: 9 10*3/uL (ref 4.0–10.5)
nRBC: 0 % (ref 0.0–0.2)

## 2021-07-31 LAB — CBC WITH DIFFERENTIAL/PLATELET
Abs Immature Granulocytes: 0.01 10*3/uL (ref 0.00–0.07)
Basophils Absolute: 0 10*3/uL (ref 0.0–0.1)
Basophils Relative: 1 %
Eosinophils Absolute: 0.3 10*3/uL (ref 0.0–0.5)
Eosinophils Relative: 4 %
HCT: 21.2 % — ABNORMAL LOW (ref 39.0–52.0)
Hemoglobin: 6.8 g/dL — CL (ref 13.0–17.0)
Immature Granulocytes: 0 %
Lymphocytes Relative: 29 %
Lymphs Abs: 1.9 10*3/uL (ref 0.7–4.0)
MCH: 27.1 pg (ref 26.0–34.0)
MCHC: 32.1 g/dL (ref 30.0–36.0)
MCV: 84.5 fL (ref 80.0–100.0)
Monocytes Absolute: 0.5 10*3/uL (ref 0.1–1.0)
Monocytes Relative: 8 %
Neutro Abs: 3.9 10*3/uL (ref 1.7–7.7)
Neutrophils Relative %: 58 %
Platelets: 323 10*3/uL (ref 150–400)
RBC: 2.51 MIL/uL — ABNORMAL LOW (ref 4.22–5.81)
RDW: 17.7 % — ABNORMAL HIGH (ref 11.5–15.5)
WBC: 6.6 10*3/uL (ref 4.0–10.5)
nRBC: 0 % (ref 0.0–0.2)

## 2021-07-31 LAB — TROPONIN I (HIGH SENSITIVITY)
Troponin I (High Sensitivity): 45 ng/L — ABNORMAL HIGH
Troponin I (High Sensitivity): 65 ng/L — ABNORMAL HIGH (ref <18)
Troponin I (High Sensitivity): 168 ng/L
Troponin I (High Sensitivity): 145 ng/L (ref <18)

## 2021-07-31 LAB — GLUCOSE, CAPILLARY
Glucose-Capillary: 274 mg/dL — ABNORMAL HIGH (ref 70–99)
Glucose-Capillary: 160 mg/dL — ABNORMAL HIGH (ref 70–99)

## 2021-07-31 LAB — HIV ANTIBODY (ROUTINE TESTING W REFLEX): HIV Screen 4th Generation wRfx: NONREACTIVE

## 2021-07-31 LAB — HEMOGLOBIN A1C
Hgb A1c MFr Bld: 9.1 % — ABNORMAL HIGH (ref 4.8–5.6)
Mean Plasma Glucose: 214.47 mg/dL

## 2021-07-31 LAB — IRON AND TIBC
Iron: 126 ug/dL (ref 45–182)
Saturation Ratios: 56 % — ABNORMAL HIGH (ref 17.9–39.5)
TIBC: 225 ug/dL — ABNORMAL LOW (ref 250–450)
UIBC: 99 ug/dL

## 2021-07-31 LAB — VITAMIN B12: Vitamin B-12: 400 pg/mL (ref 180–914)

## 2021-07-31 LAB — RETICULOCYTES
Immature Retic Fract: 32.9 % — ABNORMAL HIGH (ref 2.3–15.9)
RBC.: 2.81 MIL/uL — ABNORMAL LOW (ref 4.22–5.81)
Retic Count, Absolute: 60.1 10*3/uL (ref 19.0–186.0)
Retic Ct Pct: 2.1 % (ref 0.4–3.1)

## 2021-07-31 LAB — PREPARE RBC (CROSSMATCH)

## 2021-07-31 LAB — CBG MONITORING, ED
Glucose-Capillary: 262 mg/dL — ABNORMAL HIGH (ref 70–99)
Glucose-Capillary: 75 mg/dL (ref 70–99)
Glucose-Capillary: 163 mg/dL — ABNORMAL HIGH (ref 70–99)

## 2021-07-31 LAB — BRAIN NATRIURETIC PEPTIDE: B Natriuretic Peptide: 289 pg/mL — ABNORMAL HIGH (ref 0.0–100.0)

## 2021-07-31 LAB — RESP PANEL BY RT-PCR (FLU A&B, COVID) ARPGX2
Influenza A by PCR: NEGATIVE
Influenza B by PCR: NEGATIVE
SARS Coronavirus 2 by RT PCR: NEGATIVE

## 2021-07-31 LAB — FOLATE: Folate: 16.6 ng/mL (ref >5.9)

## 2021-07-31 LAB — SEDIMENTATION RATE: Sed Rate: 31 mm/hr — ABNORMAL HIGH (ref 0–16)

## 2021-07-31 LAB — MRSA NEXT GEN BY PCR, NASAL: MRSA by PCR Next Gen: NOT DETECTED

## 2021-07-31 LAB — FERRITIN: Ferritin: 66 ng/mL (ref 24–336)

## 2021-07-31 MED ORDER — POTASSIUM CHLORIDE CRYS ER 10 MEQ PO TBCR
10.0000 meq | EXTENDED_RELEASE_TABLET | Freq: Once | ORAL | Status: AC
  Administered 2021-07-31: 10 meq via ORAL
  Filled 2021-07-31: qty 1

## 2021-07-31 MED ORDER — ACETAMINOPHEN 650 MG RE SUPP: 650.0000 mg | Freq: Four times a day (QID) | RECTAL | Status: DC | PRN

## 2021-07-31 MED ORDER — METHOCARBAMOL 500 MG PO TABS: 1000.0000 mg | ORAL_TABLET | Freq: Four times a day (QID) | ORAL | Status: DC | PRN

## 2021-07-31 MED ORDER — ACETAMINOPHEN 325 MG PO TABS
650.0000 mg | ORAL_TABLET | Freq: Four times a day (QID) | ORAL | Status: DC | PRN
  Administered 2021-08-01: 650 mg via ORAL
  Filled 2021-07-31 (×2): qty 2

## 2021-07-31 MED ORDER — CARVEDILOL 3.125 MG PO TABS
3.1250 mg | ORAL_TABLET | Freq: Two times a day (BID) | ORAL | Status: DC
  Administered 2021-07-31: 3.125 mg via ORAL
  Filled 2021-07-31 (×2): qty 1

## 2021-07-31 MED ORDER — CLOPIDOGREL BISULFATE 75 MG PO TABS
75.0000 mg | ORAL_TABLET | Freq: Every day | ORAL | Status: DC
  Administered 2021-07-31: 75 mg via ORAL
  Filled 2021-07-31 (×2): qty 1

## 2021-07-31 MED ORDER — INSULIN GLARGINE-YFGN 100 UNIT/ML ~~LOC~~ SOLN
14.0000 [IU] | Freq: Once | SUBCUTANEOUS | Status: AC
  Administered 2021-07-31: 14 [IU] via SUBCUTANEOUS
  Filled 2021-07-31: qty 0.14

## 2021-07-31 MED ORDER — SODIUM CHLORIDE 0.9% FLUSH
3.0000 mL | Freq: Two times a day (BID) | INTRAVENOUS | Status: DC
  Administered 2021-07-31 – 2021-08-03 (×8): 3 mL via INTRAVENOUS

## 2021-07-31 MED ORDER — ATORVASTATIN CALCIUM 80 MG PO TABS
80.0000 mg | ORAL_TABLET | Freq: Every day | ORAL | Status: DC
  Filled 2021-07-31 (×2): qty 1

## 2021-07-31 MED ORDER — CALCIUM CARBONATE ANTACID 500 MG PO CHEW
500.0000 mg | CHEWABLE_TABLET | Freq: Every day | ORAL | Status: DC
  Filled 2021-07-31 (×3): qty 3

## 2021-07-31 MED ORDER — INSULIN GLARGINE-YFGN 100 UNIT/ML ~~LOC~~ SOLN
10.0000 [IU] | Freq: Every day | SUBCUTANEOUS | Status: DC
  Administered 2021-07-31 – 2021-08-03 (×3): 10 [IU] via SUBCUTANEOUS
  Filled 2021-07-31 (×5): qty 0.1

## 2021-07-31 MED ORDER — PANTOPRAZOLE SODIUM 40 MG PO TBEC: 40.0000 mg | DELAYED_RELEASE_TABLET | Freq: Every day | ORAL | Status: DC

## 2021-07-31 MED ORDER — LANTHANUM CARBONATE 500 MG PO CHEW
2000.0000 mg | CHEWABLE_TABLET | Freq: Three times a day (TID) | ORAL | Status: DC
  Administered 2021-07-31: 2000 mg via ORAL
  Filled 2021-07-31 (×14): qty 4

## 2021-07-31 MED ORDER — NITROGLYCERIN 0.4 MG SL SUBL: 0.4000 mg | SUBLINGUAL_TABLET | SUBLINGUAL | Status: DC | PRN

## 2021-07-31 MED ORDER — INSULIN ASPART 100 UNIT/ML IJ SOLN
0.0000 [IU] | INTRAMUSCULAR | Status: DC
  Administered 2021-07-31: 3 [IU] via SUBCUTANEOUS
  Administered 2021-07-31: 1 [IU] via SUBCUTANEOUS
  Administered 2021-07-31: 3 [IU] via SUBCUTANEOUS
  Administered 2021-08-03: 20:00:00 2 [IU] via SUBCUTANEOUS

## 2021-07-31 MED ORDER — ASPIRIN EC 81 MG PO TBEC
81.0000 mg | DELAYED_RELEASE_TABLET | Freq: Every day | ORAL | Status: DC
  Administered 2021-08-03 – 2021-08-04 (×2): 81 mg via ORAL
  Filled 2021-07-31 (×4): qty 1

## 2021-07-31 MED ORDER — CALCITRIOL 0.25 MCG PO CAPS
1.2500 ug | ORAL_CAPSULE | ORAL | Status: DC
  Administered 2021-07-31 – 2021-08-03 (×2): 1.25 ug via ORAL
  Filled 2021-07-31: qty 5
  Filled 2021-07-31: qty 1

## 2021-07-31 MED ORDER — SODIUM CHLORIDE 0.9 % IV SOLN: 10.0000 mL/h | Freq: Once | INTRAVENOUS | Status: DC

## 2021-07-31 MED ORDER — PANTOPRAZOLE SODIUM 40 MG IV SOLR
40.0000 mg | Freq: Two times a day (BID) | INTRAVENOUS | Status: DC
  Administered 2021-07-31 – 2021-08-03 (×6): 40 mg via INTRAVENOUS
  Filled 2021-07-31 (×7): qty 40

## 2021-07-31 MED ORDER — SODIUM CHLORIDE 0.9% IV SOLUTION: Freq: Once | INTRAVENOUS | Status: DC

## 2021-07-31 MED ORDER — ISOSORBIDE MONONITRATE ER 30 MG PO TB24
30.0000 mg | ORAL_TABLET | Freq: Every day | ORAL | Status: DC
  Administered 2021-07-31: 30 mg via ORAL
  Filled 2021-07-31 (×2): qty 1

## 2021-07-31 NOTE — ED Notes (Signed)
Breakfast tray ordered 9:12a

## 2021-07-31 NOTE — ED Provider Notes (Signed)
[facility] EMERGENCY DEPARTMENT Provider Note   CSN: [id number] Arrival date & time: 07/31/21  4742     History Chief Complaint  Patient presents with   Chest Pain   Emesis    [name]. is a 45 y.o. male.  The history is provided by the patient and medical records.  Chest Pain Associated symptoms: vomiting   Emesis [name]. is a 45 y.o. male who presents to the Emergency Department complaining of chest pain.  He presents to the emergency department by EMS for evaluation of chest pain that started 45 minutes prior to ED arrival.  He states that he was feeling relatively well and leaving a movie when he developed central chest pain that radiates to his left arm.  Pain is described as sharp in nature.  He initially thought it was indigestion.  He had associated 3 episodes of emesis.  He has mild associated cough and shortness of breath.  He states that previously he felt like this when he had a heart attack.  Currently his pain is resolved but he does feel fatigued.  On EMS arrival he was treated with 325 aspirin, 2 sublingual nitroglycerin with resolution of his pain.  He has a history of ESRD and dialyzes Monday, Wednesday, Friday.  He did receive a full dialysis session on Wednesday but was told that they thought he had some fluid on his lungs and some bronchitis.  He has had mild cough for the last week.  He is unsure if they were able to get him to his dry weight.    Past Medical History:  Diagnosis Date   Anemia    Atherosclerosis of lower extremity (HCC)    Blood clot in vein    right calf   CAD (coronary artery disease)    Cataracts, bilateral    Chronic combined systolic and diastolic heart failure (HCC)    Complication of anesthesia    Depression    ESRD (end stage renal disease) on dialysis ([place])    "MWF [place]" (03/08/2017)   GERD (gastroesophageal reflux disease)    Heart murmur    History of blood transfusion     "related to OR"   Hypertension    Myocardial infarction ([place])     " light"   Nonhealing surgical wound    nonviable tissue   PONV (postoperative nausea and vomiting)    S/P unilateral BKA (below knee amputation), left ([place])    Type II diabetes mellitus ([place])    Wears glasses     Patient Active Problem List   Diagnosis Date Noted   Symptomatic anemia 07/31/2021   Hyperglycemia    Chest pain 06/23/2020   Hyperkalemia    Acute on chronic anemia    Unilateral complete BKA, right, initial encounter ([place])    Diabetes mellitus type 2 in nonobese ([place])    Bacteremia    Postoperative pain    S/P bilateral below knee amputation ([place]) 11/23/2018   Non-healing wound of lower extremity 11/17/2018   MRSA bacteremia 11/04/2018   Infection of amputation stump, right lower extremity ([place]) 11/04/2018   Non-healing surgical wound 09/14/2018   Chest pain, rule out acute myocardial infarction 09/07/2018   Gangrene of toe of left foot ([place]) 08/05/2018   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   Gangrene of foot ([place]) 08/05/2018   ESRD (end stage renal disease) on dialysis ([place]) 08/04/2018   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  ACS (acute coronary syndrome) ([place]) 10/28/2017   Flash pulmonary edema ([place]) 06/04/2017   Hypertensive emergency 06/04/2017   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 stage 5 chronic kidney disease on chronic dialysis ([place]) 06/04/2017   S/P BKA (below knee amputation) unilateral, left (HCC)    Post-operative pain    Acute blood loss anemia    Chronic combined systolic and diastolic CHF (congestive heart failure) (HCC)    PVD (peripheral vascular disease) ([place])    CAD (coronary artery disease)    Tobacco abuse    Diabetic foot infection ([place]) 03/08/2017   Osteomyelitis ([place]) 03/08/2017   Wound dehiscence    Dehiscence of amputation stump ([place]) 01/04/2017   Gangrene of right foot ([place])  08/02/2016   Achilles tendon contracture, left 08/02/2016   Anemia of renal disease 08/16/2015   Wound infection 08/15/2015   Diabetic ulcer of right great toe ([place]) 08/15/2015   Pain in the chest    Elevated troponin    Essential hypertension    ESRD on dialysis ([place]) 08/07/2013   NSTEMI (non-ST elevated myocardial infarction) ([place]) 05/16/2012   Murmur 05/16/2012   Renal disorder     Past Surgical History:  Procedure Laterality Date   ABDOMINAL AORTOGRAM N/A 11/02/2016   Procedure: Abdominal Aortogram;  Surgeon: Waynetta Sandy, MD;  Location: [facility] [facility];  Service: Cardiovascular;  Laterality: N/A;   ABDOMINAL AORTOGRAM W/LOWER EXTREMITY Right 08/07/2018   Procedure: ABDOMINAL AORTOGRAM W/LOWER EXTREMITY;  Surgeon: [name], MD;  Location: [place] [facility];  Service: Cardiovascular;  Laterality: Right;   AMPUTATION Left 09/27/2013   Procedure: LEFT GREAT TOE AMPUTATION;  Surgeon: [name], MD;  Location: [place];  Service: Orthopedics;  Laterality: Left;   AMPUTATION Right 08/15/2015   Procedure: Right Great Toe Amputation;  Surgeon: [name], MD;  Location: [place];  Service: Orthopedics;  Laterality: Right;   AMPUTATION Left 11/05/2016   Procedure: TRANSMETATARSAL AMPUTATION LEFT FOOT;  Surgeon: [name], MD;  Location: [place];  Service: Orthopedics;  Laterality: Left;   AMPUTATION Left 03/11/2017   Procedure: LEFT BELOW KNEE AMPUTATION;  Surgeon: [name], MD;  Location: [facility];  Service: Orthopedics;  Laterality: Left;   AMPUTATION Right 03/11/2017   Procedure: RIGHT 2ND TOE AMPUTATION;  Surgeon: [name], MD;  Location: [facility];  Service: Orthopedics;  Laterality: Right;   AMPUTATION Right 11/17/2018   Procedure: AMPUTATION BELOW KNEE;  Surgeon: [name], MD;  Location: [facility];  Service: Vascular;  Laterality: Right;   AV FISTULA PLACEMENT  left arm   CORONARY ARTERY BYPASS GRAFT N/A 11/04/2017   Procedure: CORONARY ARTERY  BYPASS GRAFTING (CABG) x three, using left internal mammary artery and right    leg greater saphenous vein;  Surgeon: Melrose Nakayama, MD;  Location: [facility];  Service: Open Heart Surgery;  Laterality: N/A;   FASCIOTOMY Right 08/07/2018   Procedure: FOUR COMPARTMENT FASCIOTOMY OF RIGHT LOWER LEG;  Surgeon: Rosetta Posner, MD;  Location: [facility];  Service: Vascular;  Laterality: Right;   FASCIOTOMY CLOSURE Right 08/10/2018   Procedure: FASCIOTOMY CLOSURE RIGHT LOWER EXTREMITY;  Surgeon: [name], MD;  Location: [facility];  Service: Vascular;  Laterality: Right;   HEMATOMA EVACUATION Right 08/07/2018   Procedure: EVACUATION HEMATOMA;  Surgeon: Rosetta Posner, MD;  Location: [facility] OR;  Service: Vascular;  Laterality: Right;   LEFT HEART CATH AND  CORONARY ANGIOGRAPHY N/A 10/31/2017   Procedure: LEFT HEART CATH AND CORONARY ANGIOGRAPHY;  Surgeon: Troy Sine, MD;  Location: [facility] [facility];  Service: Cardiovascular;  Laterality: N/A;   LEFT HEART CATHETERIZATION WITH CORONARY ANGIOGRAM N/A 09/13/2014   Procedure: LEFT HEART CATHETERIZATION WITH CORONARY ANGIOGRAM;  Surgeon: Sinclair Grooms, MD;  Location: [facility] LAB;  Service: Cardiovascular;  Laterality: N/A;   LOWER EXTREMITY ANGIOGRAPHY Bilateral 11/02/2016   Procedure: Lower Extremity Angiography;  Surgeon: Waynetta Sandy, MD;  Location: [place];  Service: Cardiovascular;  Laterality: Bilateral;   PERIPHERAL VASCULAR ATHERECTOMY Left 11/02/2016   Procedure: Peripheral Vascular Atherectomy;  Surgeon: Waynetta Sandy, MD;  Location: [facility] [facility];  Service: Cardiovascular;  Laterality: Left;  PERONEAL   PERIPHERAL VASCULAR ATHERECTOMY Right 08/07/2018   Procedure: PERIPHERAL VASCULAR ATHERECTOMY;  Surgeon: [name], MD;  Location: [facility];  Service: Cardiovascular;  Laterality: Right;  Peroneal artery   PERIPHERAL VASCULAR BALLOON ANGIOPLASTY Right 08/07/2018   Procedure: PERIPHERAL  VASCULAR BALLOON ANGIOPLASTY;  Surgeon: [name], MD;  Location: [place] [facility];  Service: Cardiovascular;  Laterality: Right;  anterior tibial artery   STUMP REVISION Left 01/11/2017   Procedure: Revision Left Transmetatarsal Amputation;  Surgeon: [name], MD;  Location: [place] [facility];  Service: Orthopedics;  Laterality: Left;   TEE WITHOUT CARDIOVERSION N/A 11/04/2017   Procedure: TRANSESOPHAGEAL ECHOCARDIOGRAM (TEE);  Surgeon: Melrose Nakayama, MD;  Location: [facility];  Service: Open Heart Surgery;  Laterality: N/A;   TRANSMETATARSAL AMPUTATION Right 08/10/2018   Procedure: RIGHT FIFTH TOE AMPUTATION;  Surgeon: [name], MD;  Location: [place];  Service: Vascular;  Laterality: Right;   TRANSMETATARSAL AMPUTATION Right 09/14/2018   Procedure: TRANSMETATARSAL AMPUTATION;  Surgeon: [name], MD;  Location: [facility] OR;  Service: Vascular;  Laterality: Right;       Family History  Problem Relation Age of Onset   Heart failure Mother    Hypertension Mother     Social History   Tobacco Use   Smoking status: Former    Packs/day: 0.50    Years: 26.00    Pack years: 13.00    Types: Cigarettes    Quit date: 10/25/2018    Years since quitting: 2.7   Smokeless tobacco: Never  Vaping Use   Vaping Use: Never used  Substance Use Topics   Alcohol use: Not Currently    Comment:  "haven't took a drink in over 5 years"   Drug use: Yes    Types: Marijuana    Comment: occasional    Home Medications Prior to Admission medications   Medication Sig Start Date End Date Taking? Authorizing Provider  acetaminophen (TYLENOL) 325 MG tablet Take 2 tablets (650 mg total) by mouth every 4 (four) hours as needed for headache or mild pain. 09/08/18   Geradine Girt, DO  aspirin EC 81 MG tablet Take 1 tablet (81 mg total) by mouth daily. 02/07/18   Burtis Junes, NP  atorvastatin (LIPITOR) 80 MG tablet Take 1 tablet (80 mg total) by mouth daily at 6 PM. Patient taking  differently: Take 80 mg by mouth daily.  11/28/18   Angiulli, Lavon Paganini, PA-C  calcitRIOL (ROCALTROL) 0.25 MCG capsule Take 5 capsules (1.25 mcg total) by mouth every Monday, Wednesday, and Friday with hemodialysis. 11/09/17   Elgie Collard, PA-C  calcium carbonate (TUMS) 500 MG chewable tablet Chew 500 mg by mouth daily. 04/25/20  [provider]  carvedilol (COREG) 3.125 MG tablet Take 3.125 mg by mouth 2 (two) times daily. 06/14/20   [provider]  clopidogrel (PLAVIX) 75 MG tablet Take 1 tablet (75 mg total) by mouth daily. 11/28/18   Angiulli, Lavon Paganini, PA-C  Darbepoetin Alfa (ARANESP) 200 MCG/0.4ML SOSY injection Inject 0.4 mLs (200 mcg total) into the vein every Monday with hemodialysis. 12/04/18   Angiulli, Lavon Paganini, PA-C  esomeprazole (NEXIUM) 40 MG capsule Take 40 mg by mouth daily. 01/17/20   [provider]  insulin aspart (NOVOLOG) 100 UNIT/ML FlexPen Inject 3 Units into the skin 3 (three) times daily with meals. Patient not taking: Reported on 03/10/2020 11/28/18   Angiulli, Lavon Paganini, PA-C  Insulin Glargine (LANTUS) 100 UNIT/ML Solostar Pen Inject 14 Units into the skin at bedtime. Patient not taking: Reported on 03/10/2020 11/28/18   Angiulli, Lavon Paganini, PA-C  isosorbide mononitrate (IMDUR) 30 MG 24 hr tablet Take 1 tablet (30 mg total) by mouth daily. 11/28/18   Angiulli, Lavon Paganini, PA-C  lanthanum (FOSRENOL) 1000 MG chewable tablet Chew 2 tablets (2,000 mg total) by mouth 3 (three) times daily with meals. 11/28/18   Angiulli, Lavon Paganini, PA-C  methocarbamol (ROBAXIN) 500 MG tablet Take 1,000 mg by mouth 4 (four) times daily as needed for muscle spasms. 06/17/20   [provider]  multivitamin (RENA-VIT) TABS tablet Take 1 tablet by mouth at bedtime. Patient taking differently: Take 1 tablet by mouth daily.  08/17/18   Kayleen Memos, DO  nitroGLYCERIN (NITROSTAT) 0.4 MG SL tablet Place 1 tablet (0.4 mg total) under the tongue every 5 (five) minutes as needed for  chest pain. 03/20/19 06/23/20  Josue Hector, MD  oxyCODONE-acetaminophen (PERCOCET) 5-325 MG tablet Take 1 tablet by mouth every 4 (four) hours as needed for severe pain. Patient not taking: Reported on 06/23/2020 03/10/20   Daleen Bo, MD    Allergies    Coconut oil  Review of Systems   Review of Systems  Cardiovascular:  Positive for chest pain.  Gastrointestinal:  Positive for vomiting.  All other systems reviewed and are negative.  Physical Exam Updated Vital Signs BP (!) 111/52 (BP Location: Right Wrist)    Pulse 90    Temp 98.3 F (36.8 C) (Oral)    Resp 20    Ht 6\' 3"  (1.905 m)    Wt 88.5 kg    SpO2 100%    BMI 24.37 kg/m   Physical Exam Vitals and nursing note reviewed.  Constitutional:      Appearance: He is well-developed.  HENT:     Head: Normocephalic and atraumatic.  Cardiovascular:     Rate and Rhythm: Normal rate and regular rhythm.     Heart sounds:    Gallop present.  Pulmonary:     Effort: Pulmonary effort is normal. No respiratory distress.     Comments: Occasional rhonchi bilaterally Abdominal:     Palpations: Abdomen is soft.     Tenderness: There is no abdominal tenderness. There is no guarding or rebound.  Musculoskeletal:        General: No tenderness.     Comments: BLE BKA  Skin:    General: Skin is warm and dry.  Neurological:     Mental Status: He is alert and oriented to person, place, and time.  Psychiatric:        Behavior: Behavior normal.    ED Results / Procedures / Treatments   Labs (all labs ordered are  listed, but only abnormal results are displayed) Labs Reviewed  COMPREHENSIVE METABOLIC PANEL - Abnormal; Notable for the following components:      Result Value   Sodium 128 (*)    Potassium 3.3 (*)    Chloride 90 (*)    Glucose, Bld 562 (*)    BUN 94 (*)    Creatinine, Ser 9.65 (*)    Calcium 8.5 (*)    Total Protein 6.3 (*)    Albumin 2.9 (*)    GFR, Estimated 6 (*)    All other components within normal limits   BRAIN NATRIURETIC PEPTIDE - Abnormal; Notable for the following components:   B Natriuretic Peptide 289.0 (*)    All other components within normal limits  CBC WITH DIFFERENTIAL/PLATELET - Abnormal; Notable for the following components:   RBC 2.51 (*)    Hemoglobin 6.8 (*)    HCT 21.2 (*)    RDW 17.7 (*)    All other components within normal limits  CBG MONITORING, ED - Abnormal; Notable for the following components:   Glucose-Capillary 262 (*)    All other components within normal limits  TROPONIN I (HIGH SENSITIVITY) - Abnormal; Notable for the following components:   Troponin I (High Sensitivity) 45 (*)    All other components within normal limits  TROPONIN I (HIGH SENSITIVITY) - Abnormal; Notable for the following components:   Troponin I (High Sensitivity) 65 (*)    All other components within normal limits  RESP PANEL BY RT-PCR (FLU A&B, COVID) ARPGX2  PROTIME-INR  HEMOGLOBIN A1C  HIV ANTIBODY (ROUTINE TESTING W REFLEX)  POC OCCULT BLOOD, ED  TYPE AND SCREEN  PREPARE RBC (CROSSMATCH)  TROPONIN I (HIGH SENSITIVITY)    EKG EKG Interpretation  Date/Time:  Friday July 31 2021 00:32:29 EST Ventricular Rate:  102 PR Interval:  148 QRS Duration: 103 QT Interval:  361 QTC Calculation: 471 R Axis:   29 Text Interpretation: Sinus tachycardia Probable left atrial enlargement Repol abnrm suggests ischemia, lateral leads Confirmed by Quintella Reichert [phone]) on 07/31/2021 12:33:54 AM  Radiology DG Chest Port 1 View  Result Date: 07/31/2021 CLINICAL DATA:  Chest pain EXAM: PORTABLE CHEST 1 VIEW COMPARISON:  06/23/2020 FINDINGS: Mild bibasilar scarring/atelectasis. No focal consolidation. No pleural effusion or pneumothorax. Heart is normal in size. Thoracic aortic atherosclerosis. Postsurgical changes related to prior CABG. Median sternotomy. IMPRESSION: No evidence of acute cardiopulmonary disease. Electronically Signed   By: Julian Hy M.D.   On: 07/31/2021 01:09     Procedures Procedures  CRITICAL CARE Performed by: Quintella Reichert   Total critical care time: 35 minutes  Critical care time was exclusive of separately billable procedures and treating other patients.  Critical care was necessary to treat or prevent imminent or life-threatening deterioration.  Critical care was time spent personally by me on the following activities: development of treatment plan with patient and/or surrogate as well as nursing, discussions with consultants, evaluation of patient's response to treatment, examination of patient, obtaining history from patient or surrogate, ordering and performing treatments and interventions, ordering and review of laboratory studies, ordering and review of radiographic studies, pulse oximetry and re-evaluation of patient's condition.  Medications Ordered in ED Medications  0.9 %  sodium chloride infusion (has no administration in time range)  insulin glargine-yfgn (SEMGLEE) injection 10 Units (has no administration in time range)  insulin aspart (novoLOG) injection 0-6 Units (3 Units Subcutaneous Given 07/31/21 0536)  atorvastatin (LIPITOR) tablet 80 mg (has no administration in time range)  carvedilol (COREG) tablet 3.125 mg (has no administration in time range)  isosorbide mononitrate (IMDUR) 24 hr tablet 30 mg (has no administration in time range)  calcitRIOL (ROCALTROL) capsule 1.25 mcg (has no administration in time range)  calcium carbonate (TUMS - dosed in mg elemental calcium) chewable tablet 500 mg (has no administration in time range)  pantoprazole (PROTONIX) EC tablet 40 mg (has no administration in time range)  lanthanum (FOSRENOL) chewable tablet 2,000 mg (has no administration in time range)  methocarbamol (ROBAXIN) tablet 1,000 mg (has no administration in time range)  sodium chloride flush (NS) 0.9 % injection 3 mL (has no administration in time range)  acetaminophen (TYLENOL) tablet 650 mg (has no administration in  time range)    Or  acetaminophen (TYLENOL) suppository 650 mg (has no administration in time range)  aspirin EC tablet 81 mg (has no administration in time range)  clopidogrel (PLAVIX) tablet 75 mg (has no administration in time range)  insulin glargine-yfgn (SEMGLEE) injection 14 Units (14 Units Subcutaneous Given 07/31/21 0536)  potassium chloride (KLOR-CON M) CR tablet 10 mEq (10 mEq Oral Given 07/31/21 0535)    ED Course  I have reviewed the triage vital signs and the nursing notes.  Pertinent labs & imaging results that were available during my care of the patient were reviewed by me and considered in my medical decision making (see chart for details).    MDM Rules/Calculators/A&P                         Patient with ESRD on hemodialysis, coronary artery disease, diabetes here for evaluation of chest pain.  EKG is abnormal, similar when compared to priors.  Initial troponin is mildly elevated.  Hemoglobin today is significantly decreased compared to 1 year prior.  No reports of hematemesis.  He does report that he has a black stool every other day.  Normally he has formed stools, twice daily.  Patient declines rectal examination in the emergency department.  No history of GI bleed.  Glucose returned elevated as well.  Labs are otherwise consistent with stable renal disease.  Discussed with patient findings of labs, recommendation for transfusion for symptomatic anemia.  Patient is in agreement with plan.  Hospitalist consulted for admission.    Final Clinical Impression(s) / ED Diagnoses Final diagnoses:  Symptomatic anemia  Hyperglycemia  ESRD (end stage renal disease) on dialysis [facility])  Precordial pain    Rx / DC Orders ED Discharge Orders     None        Quintella Reichert, MD 07/31/21 0745

## 2021-07-31 NOTE — Progress Notes (Addendum)
Name: [name]. MRN: [id number] DOB: 1975-12-04     Subjective: Page from RN reporting dull midsternal chest pain 4/10 that decreased to 3/10 without intervention.   Objective:   Today's Vitals   07/31/21 2254 08/01/21 0017 08/01/21 0057 08/01/21 0149  BP: (!) 108/46 (!) 111/46 (!) 128/49 106/60  Pulse: 89 90 82 86  Resp: 17 20 10 15   Temp: 98.1 F (36.7 C) 98 F (36.7 C) 98.1 F (36.7 C) 97.9 F (36.6 C)  TempSrc: Oral Oral Oral Oral  SpO2: 99% 99% 99% 100%  Weight:      Height:      PainSc:  0-No pain  0-No pain   Body mass index is 24.37 kg/m.    EKG Interpretation  Date/Time:  Friday July 31 2021 21:05:04 EST  Ventricular Rate:  90 PR Interval:  154 QRS Duration:    96 QT Interval:  408 QTC Calculation: 499 R Axis:   24 Text Interpretation:     Normal sinus rhythm, ST&T wave abnormality, consider inferolateral ischemia    Recent Results (from the past 2160 hour(s))  Comprehensive metabolic panel     Status: Abnormal   Collection Time: 07/31/21 12:41 AM  Result Value Ref Range   Sodium 128 (L) 135 - 145 mmol/L   Potassium 3.3 (L) 3.5 - 5.1 mmol/L   Chloride 90 (L) 98 - 111 mmol/L   CO2 25 22 - 32 mmol/L   Glucose, Bld 562 (HH) 70 - 99 mg/dL    Comment: Glucose reference range applies only to samples taken after fasting for at least 8 hours. CRITICAL RESULT CALLED TO, READ BACK BY AND VERIFIED WITH: AFlorene Glen, RN [phone] 07/31/21 A GASKINS    BUN 94 (H) 6 - 20 mg/dL   Creatinine, Ser 9.65 (H) 0.61 - 1.24 mg/dL   Calcium 8.5 (L) 8.9 - 10.3 mg/dL   Total Protein 6.3 (L) 6.5 - 8.1 g/dL   Albumin 2.9 (L) 3.5 - 5.0 g/dL   AST 18 15 - 41 U/L   ALT 17 0 - 44 U/L   Alkaline Phosphatase 38 38 - 126 U/L   Total Bilirubin 0.6 0.3 - 1.2 mg/dL   GFR, Estimated 6 (L) >60 mL/min    Comment: (NOTE) Calculated using the CKD-EPI Creatinine Equation (2021)    Anion gap 13 5 - 15    Comment: Performed at [facility], [place] [address] [place], [place] [address]  Brain natriuretic peptide     Status: Abnormal   Collection Time: 07/31/21 12:41 AM  Result Value Ref Range   B Natriuretic Peptide 289.0 (H) 0.0 - 100.0 pg/mL    Comment: Performed at [facility] [address] [place], [place] [address]  Troponin I (High Sensitivity)     Status: Abnormal   Collection Time: 07/31/21 12:41 AM  Result Value Ref Range   Troponin I (High Sensitivity) 45 (H) <18 ng/L    Comment: (NOTE) Elevated high sensitivity troponin I (hsTnI) values and significant  changes across serial measurements may suggest ACS but many other  chronic and acute conditions are known to elevate hsTnI results.  Refer to the Links section for chest pain algorithms and additional  guidance. Performed at [facility], [place] [address], [place], [place] [address]   CBC with Differential     Status: Abnormal   Collection Time: 07/31/21 12:41 AM  Result Value Ref Range   WBC 6.6 4.0 -  10.5 K/uL   RBC 2.51 (L) 4.22 - 5.81 MIL/uL   Hemoglobin 6.8 (LL) 13.0 - 17.0 g/dL    Comment: REPEATED TO VERIFY THIS CRITICAL RESULT HAS VERIFIED AND BEEN CALLED TO RN A. POWELL BY PHONG NGUYEN ON 12 16 2022 AT 0109, AND HAS BEEN READ BACK.     HCT 21.2 (L) 39.0 - 52.0 %   MCV 84.5 80.0 - 100.0 fL   MCH 27.1 26.0 - 34.0 pg   MCHC 32.1 30.0 - 36.0 g/dL   RDW 17.7 (H) 11.5 - 15.5 %   Platelets 323 150 - 400 K/uL   nRBC 0.0 0.0 - 0.2 %   Neutrophils Relative % 58 %   Neutro Abs 3.9 1.7 - 7.7 K/uL   Lymphocytes Relative 29 %   Lymphs Abs 1.9 0.7 - 4.0 K/uL   Monocytes Relative 8 %   Monocytes Absolute 0.5 0.1 - 1.0 K/uL   Eosinophils Relative 4 %   Eosinophils Absolute 0.3 0.0 - 0.5 K/uL   Basophils Relative 1 %   Basophils Absolute 0.0 0.0 - 0.1 K/uL   Immature Granulocytes 0 %   Abs Immature Granulocytes 0.01 0.00 - 0.07 K/uL    Comment: Performed at [facility] [address], [place], [place] [address]  Resp Panel by RT-PCR (Flu A&B, Covid)  Nasopharyngeal Swab     Status: None   Collection Time: 07/31/21 12:41 AM   Specimen: Nasopharyngeal Swab; Nasopharyngeal(NP) swabs in vial transport medium  Result Value Ref Range   SARS Coronavirus 2 by RT PCR NEGATIVE NEGATIVE    Comment: (NOTE) SARS-CoV-2 target nucleic acids are NOT DETECTED.  The SARS-CoV-2 RNA is generally detectable in upper respiratory specimens during the acute phase of infection. The lowest concentration of SARS-CoV-2 viral copies this assay can detect is 138 copies/mL. A negative result does not preclude SARS-Cov-2 infection and should not be used as the sole basis for treatment or other patient management decisions. A negative result may occur with  improper specimen collection/handling, submission of specimen other than nasopharyngeal swab, presence of viral mutation(s) within the areas targeted by this assay, and inadequate number of viral copies(<138 copies/mL). A negative result must be combined with clinical observations, patient history, and epidemiological information. The expected result is Negative.  Fact Sheet for Patients:  EntrepreneurPulse.com.au  Fact Sheet for Healthcare Providers:  IncredibleEmployment.be  This test is no t yet approved or cleared by the [place] FDA and  has been authorized for detection and/or diagnosis of SARS-CoV-2 by FDA under an Emergency Use Authorization (EUA). This EUA will remain  in effect (meaning this test can be used) for the duration of the COVID-19 declaration under Section 564(b)(1) of the Act, 21 U.S.C.section 360bbb-3(b)(1), unless the authorization is terminated  or revoked sooner.       Influenza A by PCR NEGATIVE NEGATIVE   Influenza B by PCR NEGATIVE NEGATIVE    Comment: (NOTE) The Xpert Xpress SARS-CoV-2/FLU/RSV plus assay is intended as an aid in the diagnosis of influenza from Nasopharyngeal swab specimens and should not be used as a sole basis for  treatment. Nasal washings and aspirates are unacceptable for Xpert Xpress SARS-CoV-2/FLU/RSV testing.  Fact Sheet for Patients: EntrepreneurPulse.com.au  Fact Sheet for Healthcare Providers: IncredibleEmployment.be  This test is not yet approved or cleared by the [place] FDA and has been authorized for detection and/or diagnosis of SARS-CoV-2 by FDA under an Emergency Use Authorization (EUA). This EUA will remain in effect (meaning  this test can be used) for the duration of the COVID-19 declaration under Section 564(b)(1) of the Act, 21 U.S.C. section 360bbb-3(b)(1), unless the authorization is terminated or revoked.  Performed at [facility], [place] [address], [place], [place] [address]   Prepare RBC (crossmatch)     Status: None   Collection Time: 07/31/21  1:31 AM  Result Value Ref Range   Order Confirmation      ORDER PROCESSED BY BLOOD BANK Performed at [facility], [place] [address], [place], [place] [address]   Type and screen [place]     Status: None (Preliminary result)   Collection Time: 07/31/21  2:05 AM  Result Value Ref Range   ABO/RH(D) B POS    Antibody Screen NEG    Sample Expiration 08/03/2021,2359    Unit Number [id number]    Blood Component Type RED CELLS,LR    Unit division 00    Status of Unit ISSUED    Transfusion Status OK TO TRANSFUSE    Crossmatch Result Compatible    Unit Number [id number]    Blood Component Type RED CELLS,LR    Unit division 00    Status of Unit ISSUED    Transfusion Status OK TO TRANSFUSE    Crossmatch Result      Compatible Performed at [facility], [place] [address], [place], [place] [address]   BPAM RBC     Status: None (Preliminary result)   Collection Time: 07/31/21  2:05 AM  Result Value Ref Range   ISSUE DATE / TIME [id number]    Blood Product Unit Number [id number]    PRODUCT CODE [id number]    Unit Type and Rh 7300    Blood  Product Expiration Date 202212282359    ISSUE DATE / TIME 144315400867    Blood Product Unit Number [id number]    PRODUCT CODE [id number]    Unit Type and Rh 7300    Blood Product Expiration Date 825053976734   Troponin I (High Sensitivity)     Status: Abnormal   Collection Time: 07/31/21  3:15 AM  Result Value Ref Range   Troponin I (High Sensitivity) 65 (H) <18 ng/L    Comment: RESULT CALLED TO, READ BACK BY AND VERIFIED WITH: POWELL A,RN 07/31/21 0407 WAYK Performed at [facility], [place] [address], [place], [place] [address]   CBG monitoring, ED     Status: Abnormal   Collection Time: 07/31/21  5:16 AM  Result Value Ref Range   Glucose-Capillary 262 (H) 70 - 99 mg/dL    Comment: Glucose reference range applies only to samples taken after fasting for at least 8 hours.  CBG monitoring, ED     Status: None   Collection Time: 07/31/21  9:09 AM  Result Value Ref Range   Glucose-Capillary 75 70 - 99 mg/dL    Comment: Glucose reference range applies only to samples taken after fasting for at least 8 hours.  Hemoglobin A1c     Status: Abnormal   Collection Time: 07/31/21 10:51 AM  Result Value Ref Range   Hgb A1c MFr Bld 9.1 (H) 4.8 - 5.6 %    Comment: (NOTE) Pre diabetes:          5.7%-6.4%  Diabetes:              >6.4%  Glycemic control for   <7.0% adults with diabetes    Mean Plasma Glucose 214.47 mg/dL    Comment: Performed at  [facility], [place] [address], [place], [place] [address]  Troponin I (High Sensitivity)     Status: Abnormal   Collection Time: 07/31/21 10:51 AM  Result Value Ref Range   Troponin I (High Sensitivity) 168 (HH) <18 ng/L    Comment: CRITICAL RESULT CALLED TO, READ BACK BY AND VERIFIED WITH: C.COBB RN 1228 07/31/21 MCCORMICK K (NOTE) Elevated high sensitivity troponin I (hsTnI) values and significant  changes across serial measurements may suggest ACS but many other  chronic and acute conditions are known to elevate hsTnI results.   Refer to the Links section for chest pain algorithms and additional  guidance. Performed at [facility], [place] [address] [place], [place] [address]   Reticulocytes     Status: Abnormal   Collection Time: 07/31/21 10:51 AM  Result Value Ref Range   Retic Ct Pct 2.1 0.4 - 3.1 %   RBC. 2.81 (L) 4.22 - 5.81 MIL/uL   Retic Count, Absolute 60.1 19.0 - 186.0 K/uL   Immature Retic Fract 32.9 (H) 2.3 - 15.9 %    Comment: Performed at [facility] [address], [place], [place] [address]  HIV Antibody (routine testing w rflx)     Status: None   Collection Time: 07/31/21 10:51 AM  Result Value Ref Range   HIV Screen 4th Generation wRfx Non Reactive Non Reactive    Comment: Performed at [facility], [place] [address] [place], [place] [address]  CBG monitoring, ED     Status: Abnormal   Collection Time: 07/31/21  1:21 PM  Result Value Ref Range   Glucose-Capillary 163 (H) 70 - 99 mg/dL    Comment: Glucose reference range applies only to samples taken after fasting for at least 8 hours.  Vitamin B12     Status: None   Collection Time: 07/31/21  1:53 PM  Result Value Ref Range   Vitamin B-12 400 180 - 914 pg/mL    Comment: (NOTE) This assay is not validated for testing neonatal or myeloproliferative syndrome specimens for Vitamin B12 levels. Performed at [facility], [place] [address] [place], [place] [address]   Iron and TIBC     Status: Abnormal   Collection Time: 07/31/21  1:53 PM  Result Value Ref Range   Iron 126 45 - 182 ug/dL   TIBC 225 (L) 250 - 450 ug/dL   Saturation Ratios 56 (H) 17.9 - 39.5 %   UIBC 99 ug/dL    Comment: Performed at [facility] [address], [place], [place] [address]  Ferritin     Status: None   Collection Time: 07/31/21  1:53 PM  Result Value Ref Range   Ferritin 66 24 - 336 ng/mL    Comment: Performed at [facility] [address] [place], [place] [address]  Folate     Status: None   Collection Time:  07/31/21  1:53 PM  Result Value Ref Range   Folate 16.6 >5.9 ng/mL    Comment: Performed at [facility], [place] [address], [place], [place] [address]  MRSA Next Gen by PCR, Nasal     Status: None   Collection Time: 07/31/21  4:18 PM   Specimen: Nasal Mucosa; Nasal Swab  Result Value Ref Range   MRSA by PCR Next Gen NOT DETECTED NOT DETECTED    Comment: (NOTE) The GeneXpert MRSA Assay (FDA approved for NASAL specimens only), is one component of a comprehensive  MRSA colonization surveillance program. It is not intended to diagnose MRSA infection nor to guide or monitor treatment for MRSA infections. Test performance is not FDA approved in patients less than 43 years old. Performed at [facility], [place] [address] [place], [place] [address]   Glucose, capillary     Status: Abnormal   Collection Time: 07/31/21  8:12 PM  Result Value Ref Range   Glucose-Capillary 274 (H) 70 - 99 mg/dL    Comment: Glucose reference range applies only to samples taken after fasting for at least 8 hours.  CBC     Status: Abnormal   Collection Time: 07/31/21  9:49 PM  Result Value Ref Range   WBC 9.0 4.0 - 10.5 K/uL   RBC 2.38 (L) 4.22 - 5.81 MIL/uL   Hemoglobin 6.7 (LL) 13.0 - 17.0 g/dL    Comment: REPEATED TO VERIFY THIS CRITICAL RESULT HAS VERIFIED AND BEEN CALLED TO RN DINA HALL BY PHONG NGUYEN ON 12 16 2022 AT 2221, AND HAS BEEN READ BACK.     HCT 19.6 (L) 39.0 - 52.0 %   MCV 82.4 80.0 - 100.0 fL   MCH 28.2 26.0 - 34.0 pg   MCHC 34.2 30.0 - 36.0 g/dL   RDW 16.9 (H) 11.5 - 15.5 %   Platelets 320 150 - 400 K/uL   nRBC 0.0 0.0 - 0.2 %    Comment: Performed at [facility] [address] [place], [place] [address]  Troponin I (High Sensitivity)     Status: Abnormal   Collection Time: 07/31/21  9:49 PM  Result Value Ref Range   Troponin I (High Sensitivity) 145 (HH) <18 ng/L    Comment: CRITICAL VALUE NOTED.  VALUE IS CONSISTENT WITH PREVIOUSLY REPORTED AND CALLED  VALUE. (NOTE) Elevated high sensitivity troponin I (hsTnI) values and significant  changes across serial measurements may suggest ACS but many other  chronic and acute conditions are known to elevate hsTnI results.  Refer to the Links section for chest pain algorithms and additional  guidance. Performed at [facility], [place] [address] [place], [place] [address]   Sedimentation rate     Status: Abnormal   Collection Time: 07/31/21 10:01 PM  Result Value Ref Range   Sed Rate 31 (H) 0 - 16 mm/hr    Comment: Performed at [facility] [address], [place], [place] [address]  Prepare RBC (crossmatch)     Status: None   Collection Time: 07/31/21 10:38 PM  Result Value Ref Range   Order Confirmation      ORDER PROCESSED BY BLOOD BANK Performed at [facility], [place] [address] [place], [place] [address]   Glucose, capillary     Status: Abnormal   Collection Time: 07/31/21 10:58 PM  Result Value Ref Range   Glucose-Capillary 160 (H) 70 - 99 mg/dL    Comment: Glucose reference range applies only to samples taken after fasting for at least 8 hours.     Chart review shows patient had melena 07/28/21.    Assessment:  General: Adult male, chronically ill, lying in bed in NAD HEENT: MM pink/moist,  Neuro: A&O x 4, conversant and follows commands CV: s1s2 RRR, no r/m/g. Patient denies chest pain on my assessment. Hemodynamically stable Pulm: Regular, non labored on room air , breath sounds Clear-BUL & Clear-BLL. Denies shortness of breathe. GI: soft, non distended, non tender, (+) BS x 4. Denies nausea/vomiting. GU: oliguric Skin:  diffuse  hyerpigmented macules present Extremities: (L) BKA; warm/dry, bilateral pulses + 2 radial +2 popliteal, no edema noted    Plan:   Chest Pain       - EKG obtained by nursing reviewed       - PRN nitroglycerin ordered       - Troponin ordered. Trop 145 decreased from 168 at 1051 07/31/21       - CBC ordered, HGB 6.8 this AM        - Repeat EKG ordered now that patient is chest pain free  Anemia      - Transfuse 1U PRBC for current HGB 6.7      - Per chart review patient had black stool 07/28/21; today patient reports he has not had any additional dark stools since that time      - Consider GI consult in AM      - Coags ordered w AM labs      - Continue ordered pantoprazole

## 2021-07-31 NOTE — Progress Notes (Signed)
Inpatient Diabetes Program Recommendations  AACE/ADA: New Consensus Statement on Inpatient Glycemic Control (2015)  Target Ranges:  Prepandial:   less than 140 mg/dL      Peak postprandial:   less than 180 mg/dL (1-2 hours)      Critically ill patients:  140 - 180 mg/dL   Lab Results  Component Value Date   GLUCAP 75 07/31/2021   HGBA1C 8.2 (H) 06/23/2020    Review of Glycemic Control  Latest Reference Range & Units 07/31/21 05:16 07/31/21 09:09  Glucose-Capillary 70 - 99 mg/dL 262 (H) 75  (H): Data is abnormally high Diabetes history: Type 1 DM Outpatient Diabetes medications: Lantus 14 units QHS, Novolog 3 units TID Current orders for Inpatient glycemic control: Semglee 14 units  x 1, Semglee 10 units QHS, Novolog 0-6 units Q4H  Inpatient Diabetes Program Recommendations:    Consider changing Semglee dose that is scheduled to QD starting on 12/17.  A1C pending.   Thanks, Bronson Curb, MSN, RNC-OB Diabetes Coordinator [phone] (8a-5p)

## 2021-07-31 NOTE — TOC Progression Note (Signed)
Transition of Care (TOC) - Progression Note    Patient Details  Name: [name] Manas Sr. MRN: [id number] Date of Birth: [birth year]  Transition of Care [facility]) CM/SW Contact  Zenon Mayo, RN Phone Number: 07/31/2021, 4:22 PM  Clinical Narrative:     Transition of Care [facility]) Screening Note   Patient Details  Name: [name] Bauer Sr. Date of Birth: Aug 04, 1976   Transition of Care [facility]) CM/SW Contact:    Zenon Mayo, RN Phone Number: 07/31/2021, 4:22 PM    Transition of Care Department [facility]) has reviewed patient and no TOC needs have been identified at this time. We will continue to monitor patient advancement through interdisciplinary progression rounds. If new patient transition needs arise, please place a TOC consult.          Expected Discharge Plan and Services                                                 Social Determinants of Health (SDOH) Interventions    Readmission Risk Interventions Readmission Risk Prevention Plan 11/23/2018  Transportation Screening Complete  PCP or Specialist appointment within 3-5 days of discharge Complete  HRI or Home Care Consult Complete  SW Recovery Care/Counseling Consult Complete  Palliative Care Screening Not [place] Complete  Some recent data might be hidden

## 2021-07-31 NOTE — ED Notes (Signed)
Attempted report x1. 

## 2021-07-31 NOTE — Consult Note (Signed)
CARDIOLOGY CONSULT NOTE       Patient ID: [name] Labella Sr. MRN: [id number] DOB/AGE: 23-Jun-1976 45 y.o.  Admit date: 07/31/2021 Referring Physician: Opyd Primary Physician: Sandi Mariscal, MD Primary Cardiologist: Croitoru Reason for Consultation: Chest Pain  Principal Problem:   Symptomatic anemia Active Problems:   ESRD on dialysis ([place])   Elevated troponin   CAD (coronary artery disease)   ESRD (end stage renal disease) on dialysis ([place])   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[place])   Chest pain   HPI:  45 y.o. 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 Admitted with dyspnea, chest pain and found to have marked anemia with Hb going from 12/3 a year ago to 6.8 now. Denies overt blood in stool. Chest pain gone now on 2nd unit of blood Troponin no trend in setting of CRF 45->66. ECG with no acute changes lateral repolarization abnormality more likely LVH with strain. Echo 09/08/18 EF 50-55% no significant valve dx  ROS All other systems reviewed and negative except as noted above  Past Medical History:  Diagnosis Date   Anemia    Atherosclerosis of lower extremity (HCC)    Blood clot in vein    right calf   CAD (coronary artery disease)    Cataracts, bilateral    Chronic combined systolic and diastolic heart failure (HCC)    Complication of anesthesia    Depression    ESRD (end stage renal disease) on dialysis ([place])    "MWF [place]" (03/08/2017)   GERD (gastroesophageal reflux disease)    Heart murmur    History of blood transfusion    "related to OR"   Hypertension    Myocardial infarction ([place])     " light"   Nonhealing surgical wound    nonviable tissue   PONV (postoperative nausea and vomiting)    S/P unilateral BKA (below knee amputation), left ([place])    Type II diabetes mellitus ([place])    Wears glasses     Family History  Problem Relation Age of Onset    Heart failure Mother    Hypertension Mother     Social History   Socioeconomic History   Marital status: Legally Separated    Spouse name: Not on file   Number of children: Not on file   Years of education: Not on file   Highest education level: Not on file  Occupational History   Not on file  Tobacco Use   Smoking status: Former    Packs/day: 0.50    Years: 26.00    Pack years: 13.00    Types: Cigarettes    Quit date: 10/25/2018    Years since quitting: 2.7   Smokeless tobacco: Never  Vaping Use   Vaping Use: Never used  Substance and Sexual Activity   Alcohol use: Not Currently    Comment:  "haven't took a drink in over 5 years"   Drug use: Yes    Types: Marijuana    Comment: occasional   Sexual activity: Yes  Other Topics Concern   Not on file  Social History Narrative   Not on file   Social Determinants of Health   Financial Resource Strain: Not on file  Food Insecurity: Not on file  Transportation Needs: Not on file  Physical Activity: Not on file  Stress: Not on file  Social Connections: Not on file  Intimate  Partner Violence: Not on file    Past Surgical History:  Procedure Laterality Date   ABDOMINAL AORTOGRAM N/A 11/02/2016   Procedure: Abdominal Aortogram;  Surgeon: Waynetta Sandy, MD;  Location: [facility];  Service: Cardiovascular;  Laterality: N/A;   ABDOMINAL AORTOGRAM W/LOWER EXTREMITY Right 08/07/2018   Procedure: ABDOMINAL AORTOGRAM W/LOWER EXTREMITY;  Surgeon: [name], MD;  Location: [facility];  Service: Cardiovascular;  Laterality: Right;   AMPUTATION Left 09/27/2013   Procedure: LEFT GREAT TOE AMPUTATION;  Surgeon: [name], MD;  Location: [facility];  Service: Orthopedics;  Laterality: Left;   AMPUTATION Right 08/15/2015   Procedure: Right Great Toe Amputation;  Surgeon: [name], MD;  Location: [facility];  Service: Orthopedics;  Laterality: Right;   AMPUTATION Left 11/05/2016   Procedure:  TRANSMETATARSAL AMPUTATION LEFT FOOT;  Surgeon: [name], MD;  Location: [facility];  Service: Orthopedics;  Laterality: Left;   AMPUTATION Left 03/11/2017   Procedure: LEFT BELOW KNEE AMPUTATION;  Surgeon: [name], MD;  Location: [facility];  Service: Orthopedics;  Laterality: Left;   AMPUTATION Right 03/11/2017   Procedure: RIGHT 2ND TOE AMPUTATION;  Surgeon: [name], MD;  Location: [facility];  Service: Orthopedics;  Laterality: Right;   AMPUTATION Right 11/17/2018   Procedure: AMPUTATION BELOW KNEE;  Surgeon: [name], MD;  Location: [facility];  Service: Vascular;  Laterality: Right;   AV FISTULA PLACEMENT  left arm   CORONARY ARTERY BYPASS GRAFT N/A 11/04/2017   Procedure: CORONARY ARTERY BYPASS GRAFTING (CABG) x three, using left internal mammary artery and right    leg greater saphenous vein;  Surgeon: Melrose Nakayama, MD;  Location: [facility];  Service: Open Heart Surgery;  Laterality: N/A;   FASCIOTOMY Right 08/07/2018   Procedure: FOUR COMPARTMENT FASCIOTOMY OF RIGHT LOWER LEG;  Surgeon: Rosetta Posner, MD;  Location: [facility];  Service: Vascular;  Laterality: Right;   FASCIOTOMY CLOSURE Right 08/10/2018   Procedure: FASCIOTOMY CLOSURE RIGHT LOWER EXTREMITY;  Surgeon: [name], MD;  Location: [facility];  Service: Vascular;  Laterality: Right;   HEMATOMA EVACUATION Right 08/07/2018   Procedure: EVACUATION HEMATOMA;  Surgeon: Rosetta Posner, MD;  Location: [facility];  Service: Vascular;  Laterality: Right;   LEFT HEART CATH AND CORONARY ANGIOGRAPHY N/A 10/31/2017   Procedure: LEFT HEART CATH AND CORONARY ANGIOGRAPHY;  Surgeon: Troy Sine, MD;  Location: [facility];  Service: Cardiovascular;  Laterality: N/A;   LEFT HEART CATHETERIZATION WITH CORONARY ANGIOGRAM N/A 09/13/2014   Procedure: LEFT HEART CATHETERIZATION WITH CORONARY ANGIOGRAM;  Surgeon: Sinclair Grooms, MD;  Location: [facility];  Service: Cardiovascular;  Laterality: N/A;   LOWER EXTREMITY ANGIOGRAPHY  Bilateral 11/02/2016   Procedure: Lower Extremity Angiography;  Surgeon: Waynetta Sandy, MD;  Location: [facility];  Service: Cardiovascular;  Laterality: Bilateral;   PERIPHERAL VASCULAR ATHERECTOMY Left 11/02/2016   Procedure: Peripheral Vascular Atherectomy;  Surgeon: Waynetta Sandy, MD;  Location: [facility];  Service: Cardiovascular;  Laterality: Left;  PERONEAL   PERIPHERAL VASCULAR ATHERECTOMY Right 08/07/2018   Procedure: PERIPHERAL VASCULAR ATHERECTOMY;  Surgeon: [name], MD;  Location: [facility];  Service: Cardiovascular;  Laterality: Right;  Peroneal artery   PERIPHERAL VASCULAR BALLOON ANGIOPLASTY Right 08/07/2018   Procedure: PERIPHERAL VASCULAR BALLOON ANGIOPLASTY;  Surgeon: [name], MD;  Location: [facility];  Service: Cardiovascular;  Laterality: Right;  anterior tibial  artery   STUMP REVISION Left 01/11/2017   Procedure: Revision Left Transmetatarsal Amputation;  Surgeon: [name], MD;  Location: [facility];  Service: Orthopedics;  Laterality: Left;   TEE WITHOUT CARDIOVERSION N/A 11/04/2017   Procedure: TRANSESOPHAGEAL ECHOCARDIOGRAM (TEE);  Surgeon: Melrose Nakayama, MD;  Location: [facility];  Service: Open Heart Surgery;  Laterality: N/A;   TRANSMETATARSAL AMPUTATION Right 08/10/2018   Procedure: RIGHT FIFTH TOE AMPUTATION;  Surgeon: [name], MD;  Location: [facility];  Service: Vascular;  Laterality: Right;   TRANSMETATARSAL AMPUTATION Right 09/14/2018   Procedure: TRANSMETATARSAL AMPUTATION;  Surgeon: [name], MD;  Location: [facility];  Service: Vascular;  Laterality: Right;      Current Facility-Administered Medications:    0.9 %  sodium chloride infusion, 10 mL/hr, Intravenous, Once, Opyd, Ilene Qua, MD   acetaminophen (TYLENOL) tablet 650 mg, 650 mg, Oral, Q6H PRN **OR** acetaminophen (TYLENOL) suppository 650 mg, 650 mg, Rectal, Q6H PRN, Opyd, Ilene Qua, MD   aspirin EC tablet  81 mg, 81 mg, Oral, Daily, Opyd, Ilene Qua, MD   atorvastatin (LIPITOR) tablet 80 mg, 80 mg, Oral, q1800, Opyd, Ilene Qua, MD   calcitRIOL (ROCALTROL) capsule 1.25 mcg, 1.25 mcg, Oral, Q M,W,F-HD, Opyd, Ilene Qua, MD   calcium carbonate (TUMS - dosed in mg elemental calcium) chewable tablet 500 mg, 500 mg, Oral, QAC supper, Opyd, Ilene Qua, MD   carvedilol (COREG) tablet 3.125 mg, 3.125 mg, Oral, BID WC, Opyd, Ilene Qua, MD   clopidogrel (PLAVIX) tablet 75 mg, 75 mg, Oral, Daily, Opyd, Ilene Qua, MD   insulin aspart (novoLOG) injection 0-6 Units, 0-6 Units, Subcutaneous, Q4H, Opyd, Ilene Qua, MD, 3 Units at 07/31/21 0536   insulin glargine-yfgn (SEMGLEE) injection 10 Units, 10 Units, Subcutaneous, QHS, Opyd, Ilene Qua, MD   isosorbide mononitrate (IMDUR) 24 hr tablet 30 mg, 30 mg, Oral, Daily, Opyd, Ilene Qua, MD   lanthanum (FOSRENOL) chewable tablet 2,000 mg, 2,000 mg, Oral, TID WC, Opyd, Ilene Qua, MD   methocarbamol (ROBAXIN) tablet 1,000 mg, 1,000 mg, Oral, QID PRN, Opyd, Ilene Qua, MD   pantoprazole (PROTONIX) injection 40 mg, 40 mg, Intravenous, Q12H, Swayze, Ava, DO   sodium chloride flush (NS) 0.9 % injection 3 mL, 3 mL, Intravenous, Q12H, Opyd, Ilene Qua, MD  Current Outpatient Medications:    acetaminophen (TYLENOL) 325 MG tablet, Take 2 tablets (650 mg total) by mouth every 4 (four) hours as needed for headache or mild pain., Disp: , Rfl:    aspirin EC 81 MG tablet, Take 1 tablet (81 mg total) by mouth daily., Disp: 30 tablet, Rfl: 11   atorvastatin (LIPITOR) 80 MG tablet, Take 1 tablet (80 mg total) by mouth daily at 6 PM. (Patient taking differently: Take 80 mg by mouth daily. ), Disp: 30 tablet, Rfl: 0   calcitRIOL (ROCALTROL) 0.25 MCG capsule, Take 5 capsules (1.25 mcg total) by mouth every Monday, Wednesday, and Friday with hemodialysis., Disp: 30 capsule, Rfl: 1   calcium acetate (PHOSLO) 667 MG capsule, Take 2,001 mg by mouth 3 (three) times daily with meals., Disp: , Rfl:     calcium carbonate (TUMS) 500 MG chewable tablet, Chew 500 mg by mouth daily., Disp: , Rfl:    carvedilol (COREG) 3.125 MG tablet, Take 3.125 mg by mouth 2 (two) times daily., Disp: , Rfl:    clopidogrel (PLAVIX) 75 MG tablet, Take 1 tablet (75 mg total) by mouth daily., Disp: 30 tablet, Rfl: 6   Darbepoetin Alfa (ARANESP) 200 MCG/0.4ML  SOSY injection, Inject 0.4 mLs (200 mcg total) into the vein every Monday with hemodialysis., Disp: 1.68 mL, Rfl:    esomeprazole (NEXIUM) 40 MG capsule, Take 40 mg by mouth daily., Disp: , Rfl:    insulin aspart (NOVOLOG) 100 UNIT/ML FlexPen, Inject 3 Units into the skin 3 (three) times daily with meals. (Patient not taking: Reported on 03/10/2020), Disp: 15 mL, Rfl: 11   Insulin Glargine (LANTUS) 100 UNIT/ML Solostar Pen, Inject 14 Units into the skin at bedtime. (Patient not taking: Reported on 03/10/2020), Disp: 15 mL, Rfl: 11   isosorbide mononitrate (IMDUR) 30 MG 24 hr tablet, Take 1 tablet (30 mg total) by mouth daily., Disp: 30 tablet, Rfl: 0   isosorbide mononitrate (IMDUR) 60 MG 24 hr tablet, Take 60 mg by mouth at bedtime., Disp: , Rfl:    lanthanum (FOSRENOL) 1000 MG chewable tablet, Chew 2 tablets (2,000 mg total) by mouth 3 (three) times daily with meals., Disp: 180 tablet, Rfl: 0   methocarbamol (ROBAXIN) 500 MG tablet, Take 1,000 mg by mouth 4 (four) times daily as needed for muscle spasms., Disp: , Rfl:    multivitamin (RENA-VIT) TABS tablet, Take 1 tablet by mouth at bedtime. (Patient taking differently: Take 1 tablet by mouth daily. ), Disp: 30 tablet, Rfl: 0   nitroGLYCERIN (NITROSTAT) 0.4 MG SL tablet, Place 1 tablet (0.4 mg total) under the tongue every 5 (five) minutes as needed for chest pain., Disp: 25 tablet, Rfl: 2   oxyCODONE-acetaminophen (PERCOCET) 5-325 MG tablet, Take 1 tablet by mouth every 4 (four) hours as needed for severe pain. (Patient not taking: Reported on 06/23/2020), Disp: 20 tablet, Rfl: 0  aspirin EC  81 mg Oral Daily    atorvastatin  80 mg Oral q1800   calcitRIOL  1.25 mcg Oral Q M,W,F-HD   calcium carbonate  500 mg Oral QAC supper   carvedilol  3.125 mg Oral BID WC   clopidogrel  75 mg Oral Daily   insulin aspart  0-6 Units Subcutaneous Q4H   insulin glargine-yfgn  10 Units Subcutaneous QHS   isosorbide mononitrate  30 mg Oral Daily   lanthanum  2,000 mg Oral TID WC   pantoprazole (PROTONIX) IV  40 mg Intravenous Q12H   sodium chloride flush  3 mL Intravenous Q12H    sodium chloride      Physical Exam: Blood pressure (!) 111/52, pulse 90, temperature 98.3 F (36.8 C), temperature source Oral, resp. rate 20, height 6\' 3"  (1.905 m), weight 88.5 kg, SpO2 100 %.    Chronically obese black male Lungs clear SEM flow LLSB Fistula LUE Abdomen benign  Post LBKA and Right forefoot amputation   Labs:   Lab Results  Component Value Date   WBC 6.6 07/31/2021   HGB 6.8 (LL) 07/31/2021   HCT 21.2 (L) 07/31/2021   MCV 84.5 07/31/2021   PLT 323 07/31/2021    Recent Labs  Lab 07/31/21 0041  NA 128*  K 3.3*  CL 90*  CO2 25  BUN 94*  CREATININE 9.65*  CALCIUM 8.5*  PROT 6.3*  BILITOT 0.6  ALKPHOS 38  ALT 17  AST 18  GLUCOSE 562*   Lab Results  Component Value Date   TROPONINI 1.32 (HH) 11/04/2018    Lab Results  Component Value Date   CHOL 171 02/07/2018   CHOL 187 10/29/2017   CHOL  01/31/2010    164        ATP III CLASSIFICATION:  <200  mg/dL   Desirable  200-239  mg/dL   Borderline High  >=240    mg/dL   High          Lab Results  Component Value Date   HDL 38 (L) 02/07/2018   HDL 35 (L) 10/29/2017   HDL 48 01/31/2010   Lab Results  Component Value Date   LDLCALC 83 02/07/2018   [place] 99 10/29/2017   LDLCALC  01/31/2010    98        Total Cholesterol/HDL:CHD Risk Coronary Heart Disease Risk Table                     Men   Women  1/2 Average Risk   3.4   3.3  Average Risk       5.0   4.4  2 X Average Risk   9.6   7.1  3 X Average Risk  23.4   11.0         Use the calculated Patient Ratio above and the CHD Risk Table to determine the patient's CHD Risk.        ATP III CLASSIFICATION (LDL):  <100     mg/dL   Optimal  100-129  mg/dL   Near or Above                    Optimal  130-159  mg/dL   Borderline  160-189  mg/dL   High  >190     mg/dL   Very High   Lab Results  Component Value Date   TRIG 249 (H) 02/07/2018   TRIG 266 (H) 10/29/2017   TRIG 90 01/31/2010   Lab Results  Component Value Date   CHOLHDL 4.5 02/07/2018   CHOLHDL 5.3 10/29/2017   CHOLHDL 3.4 01/31/2010   No results found for: LDLDIRECT    Radiology: [facility] Chest Port 1 View  Result Date: 07/31/2021 CLINICAL DATA:  Chest pain EXAM: PORTABLE CHEST 1 VIEW COMPARISON:  06/23/2020 FINDINGS: Mild bibasilar scarring/atelectasis. No focal consolidation. No pleural effusion or pneumothorax. Heart is normal in size. Thoracic aortic atherosclerosis. Postsurgical changes related to prior CABG. Median sternotomy. IMPRESSION: No evidence of acute cardiopulmonary disease. Electronically Signed   By: Julian Hy M.D.   On: 07/31/2021 01:09    EKG: SR LVH with strain lateral T wave changes    ASSESSMENT AND PLAN:   Chest Pain:  post CABG 2019 troponin not significant ECG no acute changes likely demand from profound anemia Not a candidate for cath Continue ASA if possible Continue statin and beta blocker Can consider risk stratification as outpatient with myovue  Anemia:  per primary service/GI  on 2nd unit hemodynamics stable  DM:  Discussed low carb diet.  Target hemoglobin A1c is 6.5 or less.  Continue current medications. BS 562 on admission with A1c 8.2 CRF:  good thrill for access dialysis day today  PVD:  f/u Dr Donzetta Matters has prosthetic left leg  Signed: Jenkins Rouge 07/31/2021, 10:05 AM

## 2021-07-31 NOTE — H&P (Signed)
History and Physical    [name] MKL:[id number] DOB: 1976/08/14 DOA: 07/31/2021  PCP: Sandi Mariscal, MD   Patient coming from: Home   Chief Complaint: Chest pain   HPI: [name] Sherrod Sr. is a pleasant 45 y.o. male with medical history significant for insulin-dependent diabetes mellitus, ESRD on hemodialysis, CAD status post CABG in 2019, and PAD with bilateral BKA, now presenting to the emergency department with chest pain, dyspnea, and fatigue.  Patient reports that he had been experiencing some fatigue and exertional dyspnea, then developed chest discomfort while at rest yesterday, radiating from his left chest to left shoulder, and associated with nausea.  He was treated with aspirin 324 mg and 2 doses of nitroglycerin prior to arrival in the ED and reports the pain has resolved.  He had nausea and 3 episodes of vomiting associated with this but denies any abdominal pain or hematemesis.  He reports having a black stool on 07/28/2021 but has subsequently had normal stool.  He takes antiplatelets but no ibuprofen, naproxen, or other NSAID.  ED Course: Upon arrival to the ED, patient is found to be afebrile, saturating well on room air, slightly tachycardic, and with stable blood pressure.  EKG features sinus tachycardia with rate 102 and repolarization abnormality.  Chest x-ray negative for acute cardiopulmonary disease.  Chemistry panel notable for glucose 562 and potassium 3.3.  CBC features a hemoglobin of 6.8, down from 12.3 a year ago.  BNP is 289 and troponin was 45 and then 65.  Patient refused DRE in the ED.  Insulin and 1 unit of RBC was ordered.  Review of Systems:  All other systems reviewed and apart from HPI, are negative.  Past Medical History:  Diagnosis Date   Anemia    Atherosclerosis of lower extremity (HCC)    Blood clot in vein    right calf   CAD (coronary artery disease)    Cataracts, bilateral    Chronic combined systolic and diastolic heart  failure (HCC)    Complication of anesthesia    Depression    ESRD (end stage renal disease) on dialysis ([place])    "MWF [place]" (03/08/2017)   GERD (gastroesophageal reflux disease)    Heart murmur    History of blood transfusion    "related to OR"   Hypertension    Myocardial infarction ([place])     " light"   Nonhealing surgical wound    nonviable tissue   PONV (postoperative nausea and vomiting)    S/P unilateral BKA (below knee amputation), left ([place])    Type II diabetes mellitus ([place])    Wears glasses     Past Surgical History:  Procedure Laterality Date   ABDOMINAL AORTOGRAM N/A 11/02/2016   Procedure: Abdominal Aortogram;  Surgeon: Waynetta Sandy, MD;  Location: [place] [facility];  Service: Cardiovascular;  Laterality: N/A;   ABDOMINAL AORTOGRAM W/LOWER EXTREMITY Right 08/07/2018   Procedure: ABDOMINAL AORTOGRAM W/LOWER EXTREMITY;  Surgeon: [name], MD;  Location: [place] [facility];  Service: Cardiovascular;  Laterality: Right;   AMPUTATION Left 09/27/2013   Procedure: LEFT GREAT TOE AMPUTATION;  Surgeon: [name], MD;  Location: [place];  Service: Orthopedics;  Laterality: Left;   AMPUTATION Right 08/15/2015   Procedure: Right Great Toe Amputation;  Surgeon: [name], MD;  Location: [place];  Service: Orthopedics;  Laterality: Right;   AMPUTATION Left 11/05/2016   Procedure: TRANSMETATARSAL AMPUTATION LEFT FOOT;  Surgeon: [name], MD;  Location: [place];  Service: Orthopedics;  Laterality: Left;   AMPUTATION Left 03/11/2017   Procedure: LEFT BELOW KNEE AMPUTATION;  Surgeon: [name], MD;  Location: [place];  Service: Orthopedics;  Laterality: Left;   AMPUTATION Right 03/11/2017   Procedure: RIGHT 2ND TOE AMPUTATION;  Surgeon: [name], MD;  Location: [place];  Service: Orthopedics;  Laterality: Right;   AMPUTATION Right 11/17/2018   Procedure: AMPUTATION BELOW KNEE;  Surgeon: [name], MD;  Location: [place];  Service:  Vascular;  Laterality: Right;   AV FISTULA PLACEMENT  left arm   CORONARY ARTERY BYPASS GRAFT N/A 11/04/2017   Procedure: CORONARY ARTERY BYPASS GRAFTING (CABG) x three, using left internal mammary artery and right    leg greater saphenous vein;  Surgeon: Melrose Nakayama, MD;  Location: [place];  Service: Open Heart Surgery;  Laterality: N/A;   FASCIOTOMY Right 08/07/2018   Procedure: FOUR COMPARTMENT FASCIOTOMY OF RIGHT LOWER LEG;  Surgeon: Rosetta Posner, MD;  Location: [place];  Service: Vascular;  Laterality: Right;   FASCIOTOMY CLOSURE Right 08/10/2018   Procedure: FASCIOTOMY CLOSURE RIGHT LOWER EXTREMITY;  Surgeon: [name], MD;  Location: [place];  Service: Vascular;  Laterality: Right;   HEMATOMA EVACUATION Right 08/07/2018   Procedure: EVACUATION HEMATOMA;  Surgeon: Rosetta Posner, MD;  Location: [place];  Service: Vascular;  Laterality: Right;   LEFT HEART CATH AND CORONARY ANGIOGRAPHY N/A 10/31/2017   Procedure: LEFT HEART CATH AND CORONARY ANGIOGRAPHY;  Surgeon: Troy Sine, MD;  Location: [place] [facility];  Service: Cardiovascular;  Laterality: N/A;   LEFT HEART CATHETERIZATION WITH CORONARY ANGIOGRAM N/A 09/13/2014   Procedure: LEFT HEART CATHETERIZATION WITH CORONARY ANGIOGRAM;  Surgeon: Sinclair Grooms, MD;  Location: [facility];  Service: Cardiovascular;  Laterality: N/A;   LOWER EXTREMITY ANGIOGRAPHY Bilateral 11/02/2016   Procedure: Lower Extremity Angiography;  Surgeon: Waynetta Sandy, MD;  Location: [place] [facility];  Service: Cardiovascular;  Laterality: Bilateral;   PERIPHERAL VASCULAR ATHERECTOMY Left 11/02/2016   Procedure: Peripheral Vascular Atherectomy;  Surgeon: Waynetta Sandy, MD;  Location: [place] [facility];  Service: Cardiovascular;  Laterality: Left;  PERONEAL   PERIPHERAL VASCULAR ATHERECTOMY Right 08/07/2018   Procedure: PERIPHERAL VASCULAR ATHERECTOMY;  Surgeon: [name], MD;  Location: [place] [facility];   Service: Cardiovascular;  Laterality: Right;  Peroneal artery   PERIPHERAL VASCULAR BALLOON ANGIOPLASTY Right 08/07/2018   Procedure: PERIPHERAL VASCULAR BALLOON ANGIOPLASTY;  Surgeon: [name], MD;  Location: [place] [facility];  Service: Cardiovascular;  Laterality: Right;  anterior tibial artery   STUMP REVISION Left 01/11/2017   Procedure: Revision Left Transmetatarsal Amputation;  Surgeon: [name], MD;  Location: [place];  Service: Orthopedics;  Laterality: Left;   TEE WITHOUT CARDIOVERSION N/A 11/04/2017   Procedure: TRANSESOPHAGEAL ECHOCARDIOGRAM (TEE);  Surgeon: Melrose Nakayama, MD;  Location: [place];  Service: Open Heart Surgery;  Laterality: N/A;   TRANSMETATARSAL AMPUTATION Right 08/10/2018   Procedure: RIGHT FIFTH TOE AMPUTATION;  Surgeon: [name], MD;  Location: [place];  Service: Vascular;  Laterality: Right;   TRANSMETATARSAL AMPUTATION Right 09/14/2018   Procedure: TRANSMETATARSAL AMPUTATION;  Surgeon: [name], MD;  Location: [place];  Service: Vascular;  Laterality: Right;    Social History:   reports that he quit smoking about 2 years ago. His smoking use included cigarettes. He has a 13.00 pack-year smoking history. He has never used smokeless tobacco. He  reports that he does not currently use alcohol. He reports current drug use. Drug: Marijuana.  Allergies  Allergen Reactions   Coconut Oil Anaphylaxis and Other (See Comments)    Can use topically, allergic to coconut foods     Family History  Problem Relation Age of Onset   Heart failure Mother    Hypertension Mother      Prior to Admission medications   Medication Sig Start Date End Date Taking? Authorizing Provider  acetaminophen (TYLENOL) 325 MG tablet Take 2 tablets (650 mg total) by mouth every 4 (four) hours as needed for headache or mild pain. 09/08/18   Geradine Girt, DO  aspirin EC 81 MG tablet Take 1 tablet (81 mg total) by mouth daily. 02/07/18   Burtis Junes, NP  atorvastatin (LIPITOR) 80 MG tablet Take 1 tablet (80 mg total) by mouth daily at 6 PM. Patient taking differently: Take 80 mg by mouth daily.  11/28/18   Angiulli, Lavon Paganini, PA-C  calcitRIOL (ROCALTROL) 0.25 MCG capsule Take 5 capsules (1.25 mcg total) by mouth every Monday, Wednesday, and Friday with hemodialysis. 11/09/17   Elgie Collard, PA-C  calcium carbonate (TUMS) 500 MG chewable tablet Chew 500 mg by mouth daily. 04/25/20   [provider]  carvedilol (COREG) 3.125 MG tablet Take 3.125 mg by mouth 2 (two) times daily. 06/14/20   [provider]  clopidogrel (PLAVIX) 75 MG tablet Take 1 tablet (75 mg total) by mouth daily. 11/28/18   Angiulli, Lavon Paganini, PA-C  Darbepoetin Alfa (ARANESP) 200 MCG/0.4ML SOSY injection Inject 0.4 mLs (200 mcg total) into the vein every Monday with hemodialysis. 12/04/18   Angiulli, Lavon Paganini, PA-C  esomeprazole (NEXIUM) 40 MG capsule Take 40 mg by mouth daily. 01/17/20   [provider]  insulin aspart (NOVOLOG) 100 UNIT/ML FlexPen Inject 3 Units into the skin 3 (three) times daily with meals. Patient not taking: Reported on 03/10/2020 11/28/18   Angiulli, Lavon Paganini, PA-C  Insulin Glargine (LANTUS) 100 UNIT/ML Solostar Pen Inject 14 Units into the skin at bedtime. Patient not taking: Reported on 03/10/2020 11/28/18   Angiulli, Lavon Paganini, PA-C  isosorbide mononitrate (IMDUR) 30 MG 24 hr tablet Take 1 tablet (30 mg total) by mouth daily. 11/28/18   Angiulli, Lavon Paganini, PA-C  lanthanum (FOSRENOL) 1000 MG chewable tablet Chew 2 tablets (2,000 mg total) by mouth 3 (three) times daily with meals. 11/28/18   Angiulli, Lavon Paganini, PA-C  methocarbamol (ROBAXIN) 500 MG tablet Take 1,000 mg by mouth 4 (four) times daily as needed for muscle spasms. 06/17/20   [provider]  multivitamin (RENA-VIT) TABS tablet Take 1 tablet by mouth at bedtime. Patient taking differently: Take 1 tablet by mouth daily.  08/17/18   Kayleen Memos, DO  nitroGLYCERIN  (NITROSTAT) 0.4 MG SL tablet Place 1 tablet (0.4 mg total) under the tongue every 5 (five) minutes as needed for chest pain. 03/20/19 06/23/20  Josue Hector, MD  oxyCODONE-acetaminophen (PERCOCET) 5-325 MG tablet Take 1 tablet by mouth every 4 (four) hours as needed for severe pain. Patient not taking: Reported on 06/23/2020 03/10/20   Daleen Bo, MD    Physical Exam: Vitals:   07/31/21 0038 07/31/21 0130 07/31/21 0358 07/31/21 0413  BP:  (!) 140/51 (!) 136/42 (!) 100/55  Pulse:  94 93 90  Resp:  20 18 18   Temp:   98.5 F (36.9 C) 98.4 F (36.9 C)  TempSrc:   Oral Oral  SpO2:  98%  99%  Weight: 88.5 kg     Height: 6\' 3"  (1.905 m)       Constitutional: NAD, calm  Eyes: PERTLA, lids and conjunctivae normal ENMT: Mucous membranes are moist. Posterior pharynx clear of any exudate or lesions.   Neck: supple, no masses  Respiratory: no wheezing, no crackles. No accessory muscle use.  Cardiovascular: S1 & S2 heard, regular rate and rhythm. No extremity edema.  Abdomen: No distension, no tenderness, soft. Bowel sounds active.  Musculoskeletal: no clubbing / cyanosis. S/p b/l BKA.   Skin: no significant rashes, lesions, ulcers. Warm, dry, well-perfused. Neurologic: CN 2-12 grossly intact. Moving all extremities. Alert and oriented.  Psychiatric: Calm. Cooperative.    Labs and Imaging on Admission: I have personally reviewed following labs and imaging studies  CBC: Recent Labs  Lab 07/31/21 0041  WBC 6.6  NEUTROABS 3.9  HGB 6.8*  HCT 21.2*  MCV 84.5  PLT 017   Basic Metabolic Panel: Recent Labs  Lab 07/31/21 0041  NA 128*  K 3.3*  CL 90*  CO2 25  GLUCOSE 562*  BUN 94*  CREATININE 9.65*  CALCIUM 8.5*   GFR: Estimated Creatinine Clearance: 11.7 mL/min (A) (by C-G formula based on SCr of 9.65 mg/dL (H)). Liver Function Tests: Recent Labs  Lab 07/31/21 0041  AST 18  ALT 17  ALKPHOS 38  BILITOT 0.6  PROT 6.3*  ALBUMIN 2.9*   No results for input(s): LIPASE,  AMYLASE in the last 168 hours. No results for input(s): AMMONIA in the last 168 hours. Coagulation Profile: No results for input(s): INR, PROTIME in the last 168 hours. Cardiac Enzymes: No results for input(s): CKTOTAL, CKMB, CKMBINDEX, TROPONINI in the last 168 hours. BNP (last 3 results) No results for input(s): PROBNP in the last 8760 hours. HbA1C: No results for input(s): HGBA1C in the last 72 hours. CBG: Recent Labs  Lab 07/31/21 0516  GLUCAP 262*   Lipid Profile: No results for input(s): CHOL, HDL, LDLCALC, TRIG, CHOLHDL, LDLDIRECT in the last 72 hours. Thyroid Function Tests: No results for input(s): TSH, T4TOTAL, FREET4, T3FREE, THYROIDAB in the last 72 hours. Anemia Panel: No results for input(s): VITAMINB12, FOLATE, FERRITIN, TIBC, IRON, RETICCTPCT in the last 72 hours. Urine analysis:    Component Value Date/Time   COLORURINE RED BIOCHEMICALS MAY BE AFFECTED BY COLOR (A) 01/29/2010 0939   APPEARANCEUR CLOUDY (A) 01/29/2010 0939   LABSPEC 1.020 01/29/2010 0939   PHURINE 8.0 01/29/2010 0939   GLUCOSEU NEGATIVE 01/29/2010 0939   HGBUR LARGE (A) 01/29/2010 0939   BILIRUBINUR MODERATE (A) 01/29/2010 0939   KETONESUR 15 (A) 01/29/2010 0939   PROTEINUR >300 (A) 01/29/2010 0939   UROBILINOGEN 0.2 01/29/2010 0939   NITRITE POSITIVE (A) 01/29/2010 0939   LEUKOCYTESUR LARGE (A) 01/29/2010 0939   Sepsis Labs: @LABRCNTIP (procalcitonin:4,lacticidven:4) ) Recent Results (from the past 240 hour(s))  Resp Panel by RT-PCR (Flu A&B, Covid) Nasopharyngeal Swab     Status: None   Collection Time: 07/31/21 12:41 AM   Specimen: Nasopharyngeal Swab; Nasopharyngeal(NP) swabs in vial transport medium  Result Value Ref Range Status   SARS Coronavirus 2 by RT PCR NEGATIVE NEGATIVE Final    Comment: (NOTE) SARS-CoV-2 target nucleic acids are NOT DETECTED.  The SARS-CoV-2 RNA is generally detectable in upper respiratory specimens during the acute phase of infection. The  lowest concentration of SARS-CoV-2 viral copies this assay can detect is 138 copies/mL. A negative result does not preclude SARS-Cov-2 infection and should not be used as the  sole basis for treatment or other patient management decisions. A negative result may occur with  improper specimen collection/handling, submission of specimen other than nasopharyngeal swab, presence of viral mutation(s) within the areas targeted by this assay, and inadequate number of viral copies(<138 copies/mL). A negative result must be combined with clinical observations, patient history, and epidemiological information. The expected result is Negative.  Fact Sheet for Patients:  EntrepreneurPulse.com.au  Fact Sheet for Healthcare Providers:  IncredibleEmployment.be  This test is no t yet approved or cleared by the [place] FDA and  has been authorized for detection and/or diagnosis of SARS-CoV-2 by FDA under an Emergency Use Authorization (EUA). This EUA will remain  in effect (meaning this test can be used) for the duration of the COVID-19 declaration under Section 564(b)(1) of the Act, 21 U.S.C.section 360bbb-3(b)(1), unless the authorization is terminated  or revoked sooner.       Influenza A by PCR NEGATIVE NEGATIVE Final   Influenza B by PCR NEGATIVE NEGATIVE Final    Comment: (NOTE) The Xpert Xpress SARS-CoV-2/FLU/RSV plus assay is intended as an aid in the diagnosis of influenza from Nasopharyngeal swab specimens and should not be used as a sole basis for treatment. Nasal washings and aspirates are unacceptable for Xpert Xpress SARS-CoV-2/FLU/RSV testing.  Fact Sheet for Patients: EntrepreneurPulse.com.au  Fact Sheet for Healthcare Providers: IncredibleEmployment.be  This test is not yet approved or cleared by the [place] FDA and has been authorized for detection and/or diagnosis of SARS-CoV-2 by FDA under  an Emergency Use Authorization (EUA). This EUA will remain in effect (meaning this test can be used) for the duration of the COVID-19 declaration under Section 564(b)(1) of the Act, 21 U.S.C. section 360bbb-3(b)(1), unless the authorization is terminated or revoked.  Performed at [facility], [place] [address], [place], [place] [address]      Radiological Exams on Admission: DG Chest Port 1 View  Result Date: 07/31/2021 CLINICAL DATA:  Chest pain EXAM: PORTABLE CHEST 1 VIEW COMPARISON:  06/23/2020 FINDINGS: Mild bibasilar scarring/atelectasis. No focal consolidation. No pleural effusion or pneumothorax. Heart is normal in size. Thoracic aortic atherosclerosis. Postsurgical changes related to prior CABG. Median sternotomy. IMPRESSION: No evidence of acute cardiopulmonary disease. Electronically Signed   By: Julian Hy M.D.   On: 07/31/2021 01:09    EKG: Independently reviewed. Sinus tachycardia, rate 102, repolarization abnormality.   Assessment/Plan   1. Symptomatic anemia  - Presents with fatigue, DOE, and chest discomfort and is found to have Hgb 6.8, down from 12.3 in Nov 2021  - He reports having a dark stool on 12/13 and subsequent normal stools, denies abd pain or hematemesis  - He refused DRE in ED  - Transfuse RBC, check post-transfusion H&H, continue daily PPI    2. Chest pain; CAD  - Pt with CAD s/p CABG in 2019 presents with chest pain that resolved pta  - Troponin was 45 then 65 in ED; EKG with repolarization abnormality  - He had severe stenosis of branch vessels that could not be bypassed in 2019 and may have some demand ischemia in setting of severe anemia  - Transfuse RBC, cautiously continue ASA and Plavix, continue Lipitor, Coreg, and Imdur   3. Uncontrolled IDDM  - Serum glucose 562 in ED without DKA; A1c was 8.2% a yr ago  - He was given insulin in ED  - Continue CBG checks and insulin    4. ESRD  - Reports completing HD on 07/29/21  - No  indication for urgent HD on admission  - Renally-dose medications, monitor     DVT prophylaxis: Avoid pharmacologic ppx for now in setting of possible GIB; SCD precluded by b/l BKA  Code Status: Full  Level of Care: Level of care: Telemetry Cardiac Family Communication: None present  Disposition Plan:  Patient is from: home  Anticipated d/c is to: Home  Anticipated d/c date is: 08/01/21 Patient currently: pending FOBT, post-transfusion H&H, repeat troponin Consults called: none  Admission status: Observation     Vianne Bulls, MD [facility]  07/31/2021, 5:31 AM

## 2021-07-31 NOTE — Progress Notes (Signed)
The patient is a 45 yr old man who presented to [facility] ED with complaints of chest pain, dyspnea, and fatigue. Chest pain and dyspnea occurred with exertion initially, then chest pain would occur at rest. The pain was 7/10 in severity worse with exertion, but present at rest. It radiated from his left chest to the left shoulder. Besides dyspnea it was also associated with nausea. He had emesis x 3 on the way to the ED. No hematemesis or abdominal pain.  The patient also reported having a black stool on 07/28/2021 with normal stools following. He is on antithrombotics, but no NSAIDS.  The patient has a medical history of ESRD on HD, DVT, HTN, HLD, IDDM Type 1, CAD s/p CABG, left BKA and Right partial transmetatarsal amputation.  The patient's pain resolved after receiving ASA 324 mg and NTG x 2 prior to arrival at ED.   In the ED his Hgb was 6.8.last recorded hemoglobin on the patient was from last year and was 12.3. Glucose was elevated at 562 and K of 3.3 BNP was 289 with troponins of 45 then 65 and this morning 168.  He has been transfused with 2 units PRBC's. No further chest pain.  EKG demonstrates downgoing T's in lateral leads indicative of  Ischemia vs repolarization abnormality.  This patient was admitted earlier this am by my colleague, Dr. Myna Hidalgo.  Vitals and labwork reviewed and stable.  Patient is awake, alert, and oriented x 3. No acute distress. Denies further chest pain. Heart and lung sounds are within normal limits. Abdomen is soft, non-tender, non-distended. Extremities are negative for cyanosis, clubbing, or edema. He is moving all extremities.   Cardiology has been consulted. The patient has been evaluated by Dr. Johnsie Cancel. The patient is not deemed a candidate for LHC. He recommends continuation of statin, betablocker, and ASA if possible with risk stratification as outpatient with myoview. He also encourages glucose control and follow up with Dr. Donzetta Matters regarding his  amputation.  GI will be consulted. FOBT has been ordered. Hgb will be followed. Nephrology has been consulted as the patient is ESRD on HD MWF.

## 2021-07-31 NOTE — ED Triage Notes (Signed)
°  Patient BIB EMS for chest pain that started around 45 mins ago.  Patient has cardiac hx and dialysis patient.  Given 325 mg ASA, and 2 nitro tabs.  Pain 3/10 at this time.  Pain was middle chest and patient endorses nausea with emesis x3.

## 2021-08-01 DIAGNOSIS — G4700 Insomnia, unspecified: Secondary | ICD-10-CM | POA: Diagnosis present

## 2021-08-01 DIAGNOSIS — K31811 Angiodysplasia of stomach and duodenum with bleeding: Secondary | ICD-10-CM | POA: Diagnosis not present

## 2021-08-01 DIAGNOSIS — K219 Gastro-esophageal reflux disease without esophagitis: Secondary | ICD-10-CM | POA: Diagnosis present

## 2021-08-01 DIAGNOSIS — E1065 Type 1 diabetes mellitus with hyperglycemia: Secondary | ICD-10-CM | POA: Diagnosis present

## 2021-08-01 DIAGNOSIS — F32A Depression, unspecified: Secondary | ICD-10-CM | POA: Diagnosis present

## 2021-08-01 DIAGNOSIS — D62 Acute posthemorrhagic anemia: Secondary | ICD-10-CM | POA: Diagnosis present

## 2021-08-01 DIAGNOSIS — I248 Other forms of acute ischemic heart disease: Secondary | ICD-10-CM | POA: Diagnosis present

## 2021-08-01 DIAGNOSIS — R933 Abnormal findings on diagnostic imaging of other parts of digestive tract: Secondary | ICD-10-CM | POA: Diagnosis not present

## 2021-08-01 DIAGNOSIS — I2510 Atherosclerotic heart disease of native coronary artery without angina pectoris: Secondary | ICD-10-CM | POA: Diagnosis present

## 2021-08-01 DIAGNOSIS — N2581 Secondary hyperparathyroidism of renal origin: Secondary | ICD-10-CM | POA: Diagnosis present

## 2021-08-01 DIAGNOSIS — Z7902 Long term (current) use of antithrombotics/antiplatelets: Secondary | ICD-10-CM | POA: Diagnosis not present

## 2021-08-01 DIAGNOSIS — D649 Anemia, unspecified: Secondary | ICD-10-CM | POA: Diagnosis not present

## 2021-08-01 DIAGNOSIS — E1022 Type 1 diabetes mellitus with diabetic chronic kidney disease: Secondary | ICD-10-CM | POA: Diagnosis present

## 2021-08-01 DIAGNOSIS — Z992 Dependence on renal dialysis: Secondary | ICD-10-CM | POA: Diagnosis not present

## 2021-08-01 DIAGNOSIS — E785 Hyperlipidemia, unspecified: Secondary | ICD-10-CM | POA: Diagnosis present

## 2021-08-01 DIAGNOSIS — I132 Hypertensive heart and chronic kidney disease with heart failure and with stage 5 chronic kidney disease, or end stage renal disease: Secondary | ICD-10-CM | POA: Diagnosis present

## 2021-08-01 DIAGNOSIS — I70203 Unspecified atherosclerosis of native arteries of extremities, bilateral legs: Secondary | ICD-10-CM | POA: Diagnosis present

## 2021-08-01 DIAGNOSIS — K920 Hematemesis: Secondary | ICD-10-CM | POA: Insufficient documentation

## 2021-08-01 DIAGNOSIS — M898X9 Other specified disorders of bone, unspecified site: Secondary | ICD-10-CM | POA: Diagnosis present

## 2021-08-01 DIAGNOSIS — R34 Anuria and oliguria: Secondary | ICD-10-CM | POA: Diagnosis present

## 2021-08-01 DIAGNOSIS — N186 End stage renal disease: Secondary | ICD-10-CM | POA: Diagnosis present

## 2021-08-01 DIAGNOSIS — D631 Anemia in chronic kidney disease: Secondary | ICD-10-CM | POA: Diagnosis present

## 2021-08-01 DIAGNOSIS — R072 Precordial pain: Secondary | ICD-10-CM | POA: Diagnosis present

## 2021-08-01 DIAGNOSIS — K6381 Dieulafoy lesion of intestine: Secondary | ICD-10-CM | POA: Diagnosis not present

## 2021-08-01 DIAGNOSIS — I252 Old myocardial infarction: Secondary | ICD-10-CM | POA: Diagnosis not present

## 2021-08-01 DIAGNOSIS — I5042 Chronic combined systolic (congestive) and diastolic (congestive) heart failure: Secondary | ICD-10-CM | POA: Diagnosis present

## 2021-08-01 DIAGNOSIS — K2980 Duodenitis without bleeding: Secondary | ICD-10-CM | POA: Diagnosis present

## 2021-08-01 DIAGNOSIS — E1051 Type 1 diabetes mellitus with diabetic peripheral angiopathy without gangrene: Secondary | ICD-10-CM | POA: Diagnosis present

## 2021-08-01 DIAGNOSIS — Z20822 Contact with and (suspected) exposure to covid-19: Secondary | ICD-10-CM | POA: Diagnosis present

## 2021-08-01 DIAGNOSIS — K922 Gastrointestinal hemorrhage, unspecified: Secondary | ICD-10-CM | POA: Diagnosis not present

## 2021-08-01 DIAGNOSIS — K3182 Dieulafoy lesion (hemorrhagic) of stomach and duodenum: Secondary | ICD-10-CM | POA: Diagnosis present

## 2021-08-01 LAB — BASIC METABOLIC PANEL
Anion gap: 17 — ABNORMAL HIGH (ref 5–15)
BUN: 140 mg/dL — ABNORMAL HIGH (ref 6–20)
CO2: 21 mmol/L — ABNORMAL LOW (ref 22–32)
Calcium: 8.3 mg/dL — ABNORMAL LOW (ref 8.9–10.3)
Chloride: 94 mmol/L — ABNORMAL LOW (ref 98–111)
Creatinine, Ser: 11.85 mg/dL — ABNORMAL HIGH (ref 0.61–1.24)
GFR, Estimated: 5 mL/min — ABNORMAL LOW (ref >60)
Glucose, Bld: 97 mg/dL (ref 70–99)
Potassium: 3.9 mmol/L (ref 3.5–5.1)
Sodium: 132 mmol/L — ABNORMAL LOW (ref 135–145)

## 2021-08-01 LAB — CBC
HCT: 21 % — ABNORMAL LOW (ref 39.0–52.0)
Hemoglobin: 7.2 g/dL — ABNORMAL LOW (ref 13.0–17.0)
MCH: 28.5 pg (ref 26.0–34.0)
MCHC: 34.3 g/dL (ref 30.0–36.0)
MCV: 83 fL (ref 80.0–100.0)
Platelets: 298 10*3/uL (ref 150–400)
RBC: 2.53 MIL/uL — ABNORMAL LOW (ref 4.22–5.81)
RDW: 16.5 % — ABNORMAL HIGH (ref 11.5–15.5)
WBC: 10.2 10*3/uL (ref 4.0–10.5)
nRBC: 0 % (ref 0.0–0.2)

## 2021-08-01 LAB — PROTIME-INR
INR: 1.1 (ref 0.8–1.2)
Prothrombin Time: 14.5 seconds (ref 11.4–15.2)

## 2021-08-01 LAB — GLUCOSE, CAPILLARY
Glucose-Capillary: 78 mg/dL (ref 70–99)
Glucose-Capillary: 102 mg/dL — ABNORMAL HIGH (ref 70–99)
Glucose-Capillary: 139 mg/dL — ABNORMAL HIGH (ref 70–99)
Glucose-Capillary: 115 mg/dL — ABNORMAL HIGH (ref 70–99)
Glucose-Capillary: 141 mg/dL — ABNORMAL HIGH (ref 70–99)

## 2021-08-01 LAB — APTT: aPTT: 31 seconds (ref 24–36)

## 2021-08-01 LAB — HEPATITIS B SURFACE ANTIGEN: Hepatitis B Surface Ag: NONREACTIVE

## 2021-08-01 LAB — HEPATITIS B SURFACE ANTIBODY,QUALITATIVE: Hep B S Ab: REACTIVE — AB

## 2021-08-01 MED ORDER — DARBEPOETIN ALFA 60 MCG/0.3ML IJ SOSY
60.0000 ug | PREFILLED_SYRINGE | INTRAMUSCULAR | Status: DC
Start: 2021-08-01 — End: 2021-08-04
  Administered 2021-08-01: 60 ug via INTRAVENOUS
  Filled 2021-08-01: qty 0.3

## 2021-08-01 MED ORDER — LIDOCAINE-PRILOCAINE 2.5-2.5 % EX CREA
1.0000 "application " | TOPICAL_CREAM | CUTANEOUS | Status: DC | PRN
  Filled 2021-08-01: qty 5

## 2021-08-01 MED ORDER — HEPARIN SODIUM (PORCINE) 1000 UNIT/ML DIALYSIS
1000.0000 [IU] | INTRAMUSCULAR | Status: DC | PRN
  Filled 2021-08-01: qty 1

## 2021-08-01 MED ORDER — ALTEPLASE 2 MG IJ SOLR
2.0000 mg | Freq: Once | INTRAMUSCULAR | Status: DC | PRN
  Filled 2021-08-01: qty 2

## 2021-08-01 MED ORDER — CHLORHEXIDINE GLUCONATE CLOTH 2 % EX PADS
6.0000 | MEDICATED_PAD | Freq: Every day | CUTANEOUS | Status: DC
  Administered 2021-08-01 – 2021-08-04 (×4): 6 via TOPICAL

## 2021-08-01 MED ORDER — SODIUM CHLORIDE 0.9 % IV SOLN: 100.0000 mL | INTRAVENOUS | Status: DC | PRN

## 2021-08-01 MED ORDER — LIDOCAINE HCL (PF) 1 % IJ SOLN: 5.0000 mL | INTRAMUSCULAR | Status: DC | PRN

## 2021-08-01 MED ORDER — PENTAFLUOROPROP-TETRAFLUOROETH EX AERO
1.0000 "application " | INHALATION_SPRAY | CUTANEOUS | Status: DC | PRN
  Filled 2021-08-01: qty 116

## 2021-08-01 NOTE — H&P (View-Only) (Signed)
[facility] Consult: 12:33 PM 08/01/2021  LOS: 0 days    Referring Provider: Dr. Benny Lennert. Primary Care Physician:  Sandi Mariscal, MD at [facility]. Primary Gastroenterologist:  none    Reason for Consultation: Anemia.  No FOBT test so far.       HPI: [name] Ainsley Sr. is a 45 y.o. male.  ESRD, on hemodialysis.  Peripheral vascular disease.  Status post bilateral BKA.  CAD, status post CABG. chronic Plavix and low-dose aspirin.  Acute on chronic anemia.  IDDM. No records of previous EGD or colonoscopy.  The only dedicated GI imaging was a contrasted CTAP in 2016 for abdominal pain, constipation this showed renal cysts, chronic renal failure, extensive arterial vascular calcification No previous colonoscopy or EGD. Has received blood transfusion in the past.  Thinks he has been treated with parenteral iron on a fairly regular basis by his renal MDs.  Not sure if he is getting Epogen injections.  Renal has not yet consulted so I do not have their list of dialysis administered medications.  Presented to the hospital yesterday with symptomatic anemia.  Experiencing dyspnea, substernal chest pain.  He had had vomiting for a 2 to 3 days PTA.  Initially this was bilious but on one occasion it was coffee grounds.  He stopped having daily bowel movements but did not have any melena or blood per rectum.  Last bowel movement was yesterday, it was brown.  He takes Nexium for acid reflux and normally does not have upper GI problems and has a good appetite.  Does not use NSAIDs. Hgb 6.7 >> 2 PRBCs >> 7.2. 12.3 1-year ago.  MCV 83.  126, normal.  TIBC low at 225.  Iron sats elevated at 56%.  Ferritin 66.  Normal folate 16.  Normal B12 400 GFR 5.  BUN/creatinine 140/11.8. Troponins 65, 168, 145.  BNP 289 No acute EKG  changes.  Dr Johnsie Cancel says  pt not a candidate for cardiac catheterization and will considered outpatient Myoview. Last dose of Plavix was on 12/15.  Patient drinks brandy, consumes a pint over a period of 2 weeks. Family history of some sort of intestinal problem in his mother who died with heart failure, end-stage renal disease.  Father had i issues with ulcers.     Past Medical History:  Diagnosis Date   Anemia    Atherosclerosis of lower extremity (HCC)    Blood clot in vein    right calf   CAD (coronary artery disease)    Cataracts, bilateral    Chronic combined systolic and diastolic heart failure (HCC)    Complication of anesthesia    Depression    ESRD (end stage renal disease) on dialysis [facility])    "MWF [place]" (03/08/2017)   GERD (gastroesophageal reflux disease)    Heart murmur    History of blood transfusion    "related to OR"   Hypertension    Myocardial infarction ([place])     " light"   Nonhealing surgical wound    nonviable tissue   PONV (postoperative  nausea and vomiting)    S/P unilateral BKA (below knee amputation), left ([place])    Type II diabetes mellitus ([place])    Wears glasses     Past Surgical History:  Procedure Laterality Date   ABDOMINAL AORTOGRAM N/A 11/02/2016   Procedure: Abdominal Aortogram;  Surgeon: Waynetta Sandy, MD;  Location: [place] [facility];  Service: Cardiovascular;  Laterality: N/A;   ABDOMINAL AORTOGRAM W/LOWER EXTREMITY Right 08/07/2018   Procedure: ABDOMINAL AORTOGRAM W/LOWER EXTREMITY;  Surgeon: [name], MD;  Location: [place] [facility];  Service: Cardiovascular;  Laterality: Right;   AMPUTATION Left 09/27/2013   Procedure: LEFT GREAT TOE AMPUTATION;  Surgeon: [name], MD;  Location: [place];  Service: Orthopedics;  Laterality: Left;   AMPUTATION Right 08/15/2015   Procedure: Right Great Toe Amputation;  Surgeon: [name], MD;  Location: [place];  Service: Orthopedics;  Laterality: Right;    AMPUTATION Left 11/05/2016   Procedure: TRANSMETATARSAL AMPUTATION LEFT FOOT;  Surgeon: [name], MD;  Location: [place];  Service: Orthopedics;  Laterality: Left;   AMPUTATION Left 03/11/2017   Procedure: LEFT BELOW KNEE AMPUTATION;  Surgeon: [name], MD;  Location: [place];  Service: Orthopedics;  Laterality: Left;   AMPUTATION Right 03/11/2017   Procedure: RIGHT 2ND TOE AMPUTATION;  Surgeon: [name], MD;  Location: [place];  Service: Orthopedics;  Laterality: Right;   AMPUTATION Right 11/17/2018   Procedure: AMPUTATION BELOW KNEE;  Surgeon: [name], MD;  Location: [place];  Service: Vascular;  Laterality: Right;   AV FISTULA PLACEMENT  left arm   CORONARY ARTERY BYPASS GRAFT N/A 11/04/2017   Procedure: CORONARY ARTERY BYPASS GRAFTING (CABG) x three, using left internal mammary artery and right    leg greater saphenous vein;  Surgeon: Melrose Nakayama, MD;  Location: [place];  Service: Open Heart Surgery;  Laterality: N/A;   FASCIOTOMY Right 08/07/2018   Procedure: FOUR COMPARTMENT FASCIOTOMY OF RIGHT LOWER LEG;  Surgeon: Rosetta Posner, MD;  Location: [place];  Service: Vascular;  Laterality: Right;   FASCIOTOMY CLOSURE Right 08/10/2018   Procedure: FASCIOTOMY CLOSURE RIGHT LOWER EXTREMITY;  Surgeon: [name], MD;  Location: [place];  Service: Vascular;  Laterality: Right;   HEMATOMA EVACUATION Right 08/07/2018   Procedure: EVACUATION HEMATOMA;  Surgeon: Rosetta Posner, MD;  Location: [place];  Service: Vascular;  Laterality: Right;   LEFT HEART CATH AND CORONARY ANGIOGRAPHY N/A 10/31/2017   Procedure: LEFT HEART CATH AND CORONARY ANGIOGRAPHY;  Surgeon: Troy Sine, MD;  Location: [place] [facility];  Service: Cardiovascular;  Laterality: N/A;   LEFT HEART CATHETERIZATION WITH CORONARY ANGIOGRAM N/A 09/13/2014   Procedure: LEFT HEART CATHETERIZATION WITH CORONARY ANGIOGRAM;  Surgeon: Sinclair Grooms, MD;  Location: [facility];  Service: Cardiovascular;   Laterality: N/A;   LOWER EXTREMITY ANGIOGRAPHY Bilateral 11/02/2016   Procedure: Lower Extremity Angiography;  Surgeon: Waynetta Sandy, MD;  Location: [place] [facility];  Service: Cardiovascular;  Laterality: Bilateral;   PERIPHERAL VASCULAR ATHERECTOMY Left 11/02/2016   Procedure: Peripheral Vascular Atherectomy;  Surgeon: Waynetta Sandy, MD;  Location: [place] [facility];  Service: Cardiovascular;  Laterality: Left;  PERONEAL   PERIPHERAL VASCULAR ATHERECTOMY Right 08/07/2018   Procedure: PERIPHERAL VASCULAR ATHERECTOMY;  Surgeon: [name], MD;  Location: [place] [facility];  Service: Cardiovascular;  Laterality: Right;  Peroneal artery   PERIPHERAL VASCULAR BALLOON ANGIOPLASTY Right 08/07/2018   Procedure: PERIPHERAL VASCULAR  BALLOON ANGIOPLASTY;  Surgeon: [name], MD;  Location: [place] [facility];  Service: Cardiovascular;  Laterality: Right;  anterior tibial artery   STUMP REVISION Left 01/11/2017   Procedure: Revision Left Transmetatarsal Amputation;  Surgeon: [name], MD;  Location: [place];  Service: Orthopedics;  Laterality: Left;   TEE WITHOUT CARDIOVERSION N/A 11/04/2017   Procedure: TRANSESOPHAGEAL ECHOCARDIOGRAM (TEE);  Surgeon: Melrose Nakayama, MD;  Location: [place];  Service: Open Heart Surgery;  Laterality: N/A;   TRANSMETATARSAL AMPUTATION Right 08/10/2018   Procedure: RIGHT FIFTH TOE AMPUTATION;  Surgeon: [name], MD;  Location: [place];  Service: Vascular;  Laterality: Right;   TRANSMETATARSAL AMPUTATION Right 09/14/2018   Procedure: TRANSMETATARSAL AMPUTATION;  Surgeon: [name], MD;  Location: [place];  Service: Vascular;  Laterality: Right;    Prior to Admission medications   Medication Sig Start Date End Date Taking? Authorizing Provider  acetaminophen (TYLENOL) 325 MG tablet Take 2 tablets (650 mg total) by mouth every 4 (four) hours as needed for headache or mild pain. 09/08/18  Yes Geradine Girt,  DO  aspirin EC 81 MG tablet Take 1 tablet (81 mg total) by mouth daily. 02/07/18  Yes Burtis Junes, NP  atorvastatin (LIPITOR) 80 MG tablet Take 1 tablet (80 mg total) by mouth daily at 6 PM. Patient taking differently: Take 80 mg by mouth daily. 11/28/18  Yes Angiulli, Lavon Paganini, PA-C  calcitRIOL (ROCALTROL) 0.25 MCG capsule Take 5 capsules (1.25 mcg total) by mouth every Monday, Wednesday, and Friday with hemodialysis. 11/09/17  Yes Conte, Tessa N, PA-C  calcium acetate (PHOSLO) 667 MG capsule Take 2,001 mg by mouth 3 (three) times daily with meals. 07/15/21  Yes [provider]  calcium carbonate (TUMS - DOSED IN MG ELEMENTAL CALCIUM) 500 MG chewable tablet Chew 500 mg by mouth daily. 04/25/20  Yes [provider]  carvedilol (COREG) 3.125 MG tablet Take 3.125 mg by mouth 2 (two) times daily. 06/14/20  Yes [provider]  clopidogrel (PLAVIX) 75 MG tablet Take 1 tablet (75 mg total) by mouth daily. 11/28/18  Yes Angiulli, Lavon Paganini, PA-C  esomeprazole (NEXIUM) 40 MG capsule Take 40 mg by mouth daily. 01/17/20  Yes [provider]  Insulin Glargine (LANTUS) 100 UNIT/ML Solostar Pen Inject 14 Units into the skin at bedtime. 11/28/18  Yes Angiulli, Lavon Paganini, PA-C  isosorbide mononitrate (IMDUR) 60 MG 24 hr tablet Take 60 mg by mouth at bedtime. 07/29/21  Yes [provider]  lanthanum (FOSRENOL) 1000 MG chewable tablet Chew 2 tablets (2,000 mg total) by mouth 3 (three) times daily with meals. 11/28/18  Yes Angiulli, Lavon Paganini, PA-C  methocarbamol (ROBAXIN) 500 MG tablet Take 1,000 mg by mouth 4 (four) times daily as needed for muscle spasms. 06/17/20  Yes [provider]  multivitamin (RENA-VIT) TABS tablet Take 1 tablet by mouth at bedtime. Patient taking differently: Take 1 tablet by mouth daily. 08/17/18  Yes Kayleen Memos, DO  nitroGLYCERIN (NITROSTAT) 0.4 MG SL tablet Place 1 tablet (0.4 mg total) under the tongue every 5 (five) minutes as needed for  chest pain. 03/20/19 10/29/21 Yes Josue Hector, MD  Darbepoetin Alfa (ARANESP) 200 MCG/0.4ML SOSY injection Inject 0.4 mLs (200 mcg total) into the vein every Monday with hemodialysis. Patient not taking: Reported on 07/31/2021 12/04/18   Angiulli, Lavon Paganini, PA-C  insulin aspart (NOVOLOG) 100 UNIT/ML FlexPen Inject 3 Units into the skin 3 (three) times daily with meals. Patient  not taking: Reported on 07/31/2021 11/28/18   Angiulli, Lavon Paganini, PA-C  isosorbide mononitrate (IMDUR) 30 MG 24 hr tablet Take 1 tablet (30 mg total) by mouth daily. Patient not taking: Reported on 07/31/2021 11/28/18   Angiulli, Lavon Paganini, PA-C    Scheduled Meds:  sodium chloride   Intravenous Once   aspirin EC  81 mg Oral Daily   atorvastatin  80 mg Oral q1800   calcitRIOL  1.25 mcg Oral Q M,W,F-HD   calcium carbonate  500 mg Oral QAC supper   carvedilol  3.125 mg Oral BID WC   Chlorhexidine Gluconate Cloth  6 each Topical Q0600   clopidogrel  75 mg Oral Daily   insulin aspart  0-6 Units Subcutaneous Q4H   insulin glargine-yfgn  10 Units Subcutaneous QHS   isosorbide mononitrate  30 mg Oral Daily   lanthanum  2,000 mg Oral TID WC   pantoprazole (PROTONIX) IV  40 mg Intravenous Q12H   sodium chloride flush  3 mL Intravenous Q12H   Infusions:  sodium chloride     sodium chloride     sodium chloride     PRN Meds: sodium chloride, sodium chloride, acetaminophen **OR** acetaminophen, alteplase, heparin, lidocaine (PF), lidocaine-prilocaine, methocarbamol, nitroGLYCERIN, pentafluoroprop-tetrafluoroeth   Allergies as of 07/31/2021 - Review Complete 07/31/2021  Allergen Reaction Noted   Coconut oil Anaphylaxis and Other (See Comments) 09/12/2014    Family History  Problem Relation Age of Onset   Heart failure Mother    Hypertension Mother     Social History   Socioeconomic History   Marital status: Legally Separated    Spouse name: Not on file   Number of children: Not on file   Years of education: Not  on file   Highest education level: Not on file  Occupational History   Not on file  Tobacco Use   Smoking status: Former    Packs/day: 0.50    Years: 26.00    Pack years: 13.00    Types: Cigarettes    Quit date: 10/25/2018    Years since quitting: 2.7   Smokeless tobacco: Never  Vaping Use   Vaping Use: Never used  Substance and Sexual Activity   Alcohol use: Not Currently    Comment:  "haven't took a drink in over 5 years"   Drug use: Yes    Types: Marijuana    Comment: occasional   Sexual activity: Yes  Other Topics Concern   Not on file  Social History Narrative   Not on file   Social Determinants of Health   Financial Resource Strain: Not on file  Food Insecurity: Not on file  Transportation Needs: Not on file  Physical Activity: Not on file  Stress: Not on file  Social Connections: Not on file  Intimate Partner Violence: Not on file    REVIEW OF SYSTEMS: Constitutional: Weakness, fatigue ENT:  No nose bleeds Pulm: As of breath CV:  No palpitations, no LE edema.  Angina GU:  No hematuria, no frequency GI: No dysphagia.  See HPI. Heme: Denies unusual or excessive bleeding or bruising. Transfusions: See HPI. Neuro:  No headaches, no peripheral tingling or numbness.  No syncope, no seizures. Derm:  No itching, no rash or sores.  Endocrine:  No sweats or chills.  No polyuria or dysuria Immunization: Reviewed Travel: Not queried.   PHYSICAL EXAM: Vital signs in last 24 hours: Vitals:   08/01/21 0737 08/01/21 1131  BP: (!) 120/42 (!) 121/43  Pulse: 81 84  Resp: 15 16  Temp: 98 F (36.7 C) 97.9 F (36.6 C)  SpO2: 99% 99%   Wt Readings from Last 3 Encounters:  08/01/21 84.2 kg  06/23/20 88 kg  03/15/19 77 kg    General: Patient looks somewhat chronically ill.  Lethargic but arouses easily. Head: No signs of head trauma.  No facial asymmetry. Eyes: No conjunctival pallor or scleral icterus. Ears: Not hard of hearing Nose: No congestion or  discharge Mouth: Some absent teeth.  Tongue midline.  Mucosa is moist, pink, clear. Neck: No masses, no JVD. Lungs: Clear bilaterally.  No labored breathing.  No cough Heart: RRR.  Slight SEM. Abdomen: Not tender, not distended.  No HSM, masses, bruits, hernias.  Bowel sounds active..   Rectal: Deferred. Musc/Skeltl: No joint redness, swelling.  Status post bilateral BKA. Extremities: No CCE.  Fistula left upper extremity Neurologic: Oriented x3. Skin: No rash, no sores, no suspicious lesions. Nodes: No cervical adenopathy Psych: Cooperative.  Normal affect.  Intake/Output from previous day: 12/16 0701 - 12/17 0700 In: 1250 [P.O.:660; I.V.:315; Blood:275] Out: 0  Intake/Output this shift: No intake/output data recorded.  LAB RESULTS: Recent Labs    07/31/21 0041 07/31/21 2149 08/01/21 0446  WBC 6.6 9.0 10.2  HGB 6.8* 6.7* 7.2*  HCT 21.2* 19.6* 21.0*  PLT 323 320 298   BMET Lab Results  Component Value Date   NA 132 (L) 08/01/2021   NA 128 (L) 07/31/2021   NA 133 (L) 06/23/2020   K 3.9 08/01/2021   K 3.3 (L) 07/31/2021   K 5.2 (H) 06/23/2020   CL 94 (L) 08/01/2021   CL 90 (L) 07/31/2021   CL 103 06/23/2020   CO2 21 (L) 08/01/2021   CO2 25 07/31/2021   CO2 9 (L) 06/23/2020   GLUCOSE 97 08/01/2021   GLUCOSE 562 (HH) 07/31/2021   GLUCOSE 176 (H) 06/23/2020   BUN 140 (H) 08/01/2021   BUN 94 (H) 07/31/2021   BUN 83 (H) 06/23/2020   CREATININE 11.85 (H) 08/01/2021   CREATININE 9.65 (H) 07/31/2021   CREATININE 20.55 (H) 06/23/2020   CALCIUM 8.3 (L) 08/01/2021   CALCIUM 8.5 (L) 07/31/2021   CALCIUM 8.7 (L) 06/23/2020   LFT Recent Labs    07/31/21 0041  PROT 6.3*  ALBUMIN 2.9*  AST 18  ALT 17  ALKPHOS 38  BILITOT 0.6   PT/INR Lab Results  Component Value Date   INR 1.1 08/01/2021   INR 1.25 11/04/2017   INR 1.00 10/30/2017   Hepatitis Panel No results for input(s): HEPBSAG, HCVAB, HEPAIGM, HEPBIGM in the last 72 hours. C-Diff No components found  for: CDIFF Lipase     Component Value Date/Time   LIPASE 67 (H) 03/10/2020 0734    Drugs of Abuse  No results found for: LABOPIA, COCAINSCRNUR, LABBENZ, AMPHETMU, THCU, LABBARB   RADIOLOGY STUDIES: DG Chest Port 1 View  Result Date: 07/31/2021 CLINICAL DATA:  Chest pain EXAM: PORTABLE CHEST 1 VIEW COMPARISON:  06/23/2020 FINDINGS: Mild bibasilar scarring/atelectasis. No focal consolidation. No pleural effusion or pneumothorax. Heart is normal in size. Thoracic aortic atherosclerosis. Postsurgical changes related to prior CABG. Median sternotomy. IMPRESSION: No evidence of acute cardiopulmonary disease. Electronically Signed   By: Julian Hy M.D.   On: 07/31/2021 01:09     IMPRESSION:      Anemia of chronic kidney disease.  Contribution of blood loss anemia with reports of CGE following non-CGE/nonbloody emesis earlier this week.  Rule out esophagitis, gastritis, ulcers, AVMs, neoplasia.  Not prodrome GI symptoms.  No previous EGD or colonoscopy.  Takes Nexium daily for GERD.  Completed transfusion 2 PRBCs thus far.     Demand ischemia in setting of anemia.  Plans for possible outpatient Myoview but no cardiac cath.  Previous CABG.     Chronic Plavix, ASA.  Last dose of Plavix was at 1058 yesterday, though officially not on hold, it was not given yet today.    PLAN:        Per Dr. Havery Moros.  If EGD and colonoscopy pursued, would need to be off Plavix for 5 days.  Currently tolerating renal/carb modified diet though not very hungry and not consuming a lot, would leave this in place.   Azucena Freed  08/01/2021, 12:33 PM Phone [phone]

## 2021-08-01 NOTE — Plan of Care (Signed)
?  Problem: Education: ?Goal: Knowledge of General Education information will improve ?Description: Including pain rating scale, medication(s)/side effects and non-pharmacologic comfort measures ?Outcome: Progressing ?  ?Problem: Health Behavior/Discharge Planning: ?Goal: Ability to manage health-related needs will improve ?Outcome: Progressing ?  ?Problem: Clinical Measurements: ?Goal: Ability to maintain clinical measurements within normal limits will improve ?Outcome: Progressing ?Goal: Will remain free from infection ?Outcome: Progressing ?Goal: Respiratory complications will improve ?Outcome: Progressing ?Goal: Cardiovascular complication will be avoided ?Outcome: Progressing ?  ?Problem: Activity: ?Goal: Risk for activity intolerance will decrease ?Outcome: Progressing ?  ?Problem: Coping: ?Goal: Level of anxiety will decrease ?Outcome: Progressing ?  ?Problem: Elimination: ?Goal: Will not experience complications related to bowel motility ?Outcome: Progressing ?Goal: Will not experience complications related to urinary retention ?Outcome: Progressing ?  ?Problem: Pain Managment: ?Goal: General experience of comfort will improve ?Outcome: Progressing ?  ?Problem: Safety: ?Goal: Ability to remain free from injury will improve ?Outcome: Progressing ?  ?Problem: Skin Integrity: ?Goal: Risk for impaired skin integrity will decrease ?Outcome: Progressing ?  ?

## 2021-08-01 NOTE — Consult Note (Signed)
[facility] Renal Consultation Note    Indication for Consultation:  Management of ESRD/hemodialysis; anemia, hypertension/volume and secondary hyperparathyroidism   HPI: [name] Poch Sr. is a 45 y.o. male with ESRD on HD MWF, DMT2, HTN, CAD s/p CAGB, PAD s/p bilateral BKAs. He is admitted with symptomatic anemia. Presented to the ED with fatigue, dyspnea, cp. Hgb was 6.8 on admission. Recent outpatient trend has been 12.7 on 12/7 > 9.0 on 12/14. He was transfused 2 units prbcs and GI was consulted.   Seen and examined at bedside, still feels weak. No chest pain this am. Denies melena, but reports episode of coffee ground emesis earlier this week.  Dialysis MWF at [facility] via AVF. Last dialysis was 12/14. Missed dialysis yesterday d/t hospital admission. No difficulties with dialysis but sometimes misses treatments d/t his insomnia.   Past Medical History:  Diagnosis Date   Anemia    Atherosclerosis of lower extremity (HCC)    Blood clot in vein    right calf   CAD (coronary artery disease)    Cataracts, bilateral    Chronic combined systolic and diastolic heart failure (HCC)    Complication of anesthesia    Depression    ESRD (end stage renal disease) on dialysis ([place])    "MWF [place]" (03/08/2017)   GERD (gastroesophageal reflux disease)    Heart murmur    History of blood transfusion    "related to OR"   Hypertension    Myocardial infarction ([place])     " light"   Nonhealing surgical wound    nonviable tissue   PONV (postoperative nausea and vomiting)    S/P unilateral BKA (below knee amputation), left ([place])    Type II diabetes mellitus ([place])    Wears glasses    Past Surgical History:  Procedure Laterality Date   ABDOMINAL AORTOGRAM N/A 11/02/2016   Procedure: Abdominal Aortogram;  Surgeon: Waynetta Sandy, MD;  Location: [place] [facility];  Service: Cardiovascular;  Laterality: N/A;   ABDOMINAL AORTOGRAM W/LOWER  EXTREMITY Right 08/07/2018   Procedure: ABDOMINAL AORTOGRAM W/LOWER EXTREMITY;  Surgeon: [name], MD;  Location: [place] [facility];  Service: Cardiovascular;  Laterality: Right;   AMPUTATION Left 09/27/2013   Procedure: LEFT GREAT TOE AMPUTATION;  Surgeon: [name], MD;  Location: [place];  Service: Orthopedics;  Laterality: Left;   AMPUTATION Right 08/15/2015   Procedure: Right Great Toe Amputation;  Surgeon: [name], MD;  Location: [place];  Service: Orthopedics;  Laterality: Right;   AMPUTATION Left 11/05/2016   Procedure: TRANSMETATARSAL AMPUTATION LEFT FOOT;  Surgeon: [name], MD;  Location: [place];  Service: Orthopedics;  Laterality: Left;   AMPUTATION Left 03/11/2017   Procedure: LEFT BELOW KNEE AMPUTATION;  Surgeon: [name], MD;  Location: [place];  Service: Orthopedics;  Laterality: Left;   AMPUTATION Right 03/11/2017   Procedure: RIGHT 2ND TOE AMPUTATION;  Surgeon: [name], MD;  Location: [place];  Service: Orthopedics;  Laterality: Right;   AMPUTATION Right 11/17/2018   Procedure: AMPUTATION BELOW KNEE;  Surgeon: [name], MD;  Location: [place];  Service: Vascular;  Laterality: Right;   AV FISTULA PLACEMENT  left arm   CORONARY ARTERY BYPASS GRAFT N/A 11/04/2017   Procedure: CORONARY ARTERY BYPASS GRAFTING (CABG) x three, using left internal mammary artery and right    leg greater saphenous vein;  Surgeon: Melrose Nakayama, MD;  Location: [place] OR;  Service: Open Heart  Surgery;  Laterality: N/A;   FASCIOTOMY Right 08/07/2018   Procedure: FOUR COMPARTMENT FASCIOTOMY OF RIGHT LOWER LEG;  Surgeon: Rosetta Posner, MD;  Location: [place];  Service: Vascular;  Laterality: Right;   FASCIOTOMY CLOSURE Right 08/10/2018   Procedure: FASCIOTOMY CLOSURE RIGHT LOWER EXTREMITY;  Surgeon: [name], MD;  Location: [place];  Service: Vascular;  Laterality: Right;   HEMATOMA EVACUATION Right 08/07/2018   Procedure: EVACUATION HEMATOMA;  Surgeon: Rosetta Posner, MD;  Location: [place];  Service: Vascular;  Laterality: Right;   LEFT HEART CATH AND CORONARY ANGIOGRAPHY N/A 10/31/2017   Procedure: LEFT HEART CATH AND CORONARY ANGIOGRAPHY;  Surgeon: Troy Sine, MD;  Location: [place] [facility];  Service: Cardiovascular;  Laterality: N/A;   LEFT HEART CATHETERIZATION WITH CORONARY ANGIOGRAM N/A 09/13/2014   Procedure: LEFT HEART CATHETERIZATION WITH CORONARY ANGIOGRAM;  Surgeon: Sinclair Grooms, MD;  Location: [facility] C[facility] LAB;  Service: Cardiovascular;  Laterality: N/A;   LOWER EXTREMITY ANGIOGRAPHY Bilateral 11/02/2016   Procedure: Lower Extremity Angiography;  Surgeon: Waynetta Sandy, MD;  Location: [place] [facility];  Service: Cardiovascular;  Laterality: Bilateral;   PERIPHERAL VASCULAR ATHERECTOMY Left 11/02/2016   Procedure: Peripheral Vascular Atherectomy;  Surgeon: Waynetta Sandy, MD;  Location: [place] [facility];  Service: Cardiovascular;  Laterality: Left;  PERONEAL   PERIPHERAL VASCULAR ATHERECTOMY Right 08/07/2018   Procedure: PERIPHERAL VASCULAR ATHERECTOMY;  Surgeon: [name], MD;  Location: [place] [facility];  Service: Cardiovascular;  Laterality: Right;  Peroneal artery   PERIPHERAL VASCULAR BALLOON ANGIOPLASTY Right 08/07/2018   Procedure: PERIPHERAL VASCULAR BALLOON ANGIOPLASTY;  Surgeon: [name], MD;  Location: [place] [facility];  Service: Cardiovascular;  Laterality: Right;  anterior tibial artery   STUMP REVISION Left 01/11/2017   Procedure: Revision Left Transmetatarsal Amputation;  Surgeon: [name], MD;  Location: [place];  Service: Orthopedics;  Laterality: Left;   TEE WITHOUT CARDIOVERSION N/A 11/04/2017   Procedure: TRANSESOPHAGEAL ECHOCARDIOGRAM (TEE);  Surgeon: Melrose Nakayama, MD;  Location: [place];  Service: Open Heart Surgery;  Laterality: N/A;   TRANSMETATARSAL AMPUTATION Right 08/10/2018   Procedure: RIGHT FIFTH TOE AMPUTATION;  Surgeon: [name], MD;   Location: [facility] OR;  Service: Vascular;  Laterality: Right;   TRANSMETATARSAL AMPUTATION Right 09/14/2018   Procedure: TRANSMETATARSAL AMPUTATION;  Surgeon: [name], MD;  Location: [facility] OR;  Service: Vascular;  Laterality: Right;   Family History  Problem Relation Age of Onset   Heart failure Mother    Hypertension Mother    Social History:  reports that he quit smoking about 2 years ago. His smoking use included cigarettes. He has a 13.00 pack-year smoking history. He has never used smokeless tobacco. He reports that he does not currently use alcohol. He reports current drug use. Drug: Marijuana. Allergies  Allergen Reactions   Coconut Oil Anaphylaxis and Other (See Comments)    Can use topically, allergic to coconut foods    Prior to Admission medications   Medication Sig Start Date End Date Taking? Authorizing Provider  acetaminophen (TYLENOL) 325 MG tablet Take 2 tablets (650 mg total) by mouth every 4 (four) hours as needed for headache or mild pain. 09/08/18  Yes Geradine Girt, DO  aspirin EC 81 MG tablet Take 1 tablet (81 mg total) by mouth daily. 02/07/18  Yes Burtis Junes, NP  atorvastatin (LIPITOR) 80 MG tablet Take 1 tablet (80 mg total) by mouth daily at 6  PM. Patient taking differently: Take 80 mg by mouth daily. 11/28/18  Yes Angiulli, Lavon Paganini, PA-C  calcitRIOL (ROCALTROL) 0.25 MCG capsule Take 5 capsules (1.25 mcg total) by mouth every Monday, Wednesday, and Friday with hemodialysis. 11/09/17  Yes Conte, Tessa N, PA-C  calcium acetate (PHOSLO) 667 MG capsule Take 2,001 mg by mouth 3 (three) times daily with meals. 07/15/21  Yes [provider]  calcium carbonate (TUMS - DOSED IN MG ELEMENTAL CALCIUM) 500 MG chewable tablet Chew 500 mg by mouth daily. 04/25/20  Yes [provider]  carvedilol (COREG) 3.125 MG tablet Take 3.125 mg by mouth 2 (two) times daily. 06/14/20  Yes [provider]  clopidogrel (PLAVIX) 75 MG tablet Take 1 tablet (75  mg total) by mouth daily. 11/28/18  Yes Angiulli, Lavon Paganini, PA-C  esomeprazole (NEXIUM) 40 MG capsule Take 40 mg by mouth daily. 01/17/20  Yes [provider]  Insulin Glargine (LANTUS) 100 UNIT/ML Solostar Pen Inject 14 Units into the skin at bedtime. 11/28/18  Yes Angiulli, Lavon Paganini, PA-C  isosorbide mononitrate (IMDUR) 60 MG 24 hr tablet Take 60 mg by mouth at bedtime. 07/29/21  Yes [provider]  lanthanum (FOSRENOL) 1000 MG chewable tablet Chew 2 tablets (2,000 mg total) by mouth 3 (three) times daily with meals. 11/28/18  Yes Angiulli, Lavon Paganini, PA-C  methocarbamol (ROBAXIN) 500 MG tablet Take 1,000 mg by mouth 4 (four) times daily as needed for muscle spasms. 06/17/20  Yes [provider]  multivitamin (RENA-VIT) TABS tablet Take 1 tablet by mouth at bedtime. Patient taking differently: Take 1 tablet by mouth daily. 08/17/18  Yes Kayleen Memos, DO  nitroGLYCERIN (NITROSTAT) 0.4 MG SL tablet Place 1 tablet (0.4 mg total) under the tongue every 5 (five) minutes as needed for chest pain. 03/20/19 10/29/21 Yes Josue Hector, MD  Darbepoetin Alfa (ARANESP) 200 MCG/0.4ML SOSY injection Inject 0.4 mLs (200 mcg total) into the vein every Monday with hemodialysis. Patient not taking: Reported on 07/31/2021 12/04/18   Angiulli, Lavon Paganini, PA-C  insulin aspart (NOVOLOG) 100 UNIT/ML FlexPen Inject 3 Units into the skin 3 (three) times daily with meals. Patient not taking: Reported on 07/31/2021 11/28/18   Angiulli, Lavon Paganini, PA-C  isosorbide mononitrate (IMDUR) 30 MG 24 hr tablet Take 1 tablet (30 mg total) by mouth daily. Patient not taking: Reported on 07/31/2021 11/28/18   Angiulli, Lavon Paganini, PA-C   Current Facility-Administered Medications  Medication Dose Route Frequency Provider Last Rate Last Admin   0.9 %  sodium chloride infusion (Manually program via Guardrails IV Fluids)   Intravenous Once Foust, Katy L, NP       0.9 %  sodium chloride infusion  10 mL/hr Intravenous Once  Opyd, Ilene Qua, MD       0.9 %  sodium chloride infusion  100 mL Intravenous PRN Jalen Daluz, Thomos Lemons, PA-C       0.9 %  sodium chloride infusion  100 mL Intravenous PRN Nelline Lio Grace, PA-C       acetaminophen (TYLENOL) tablet 650 mg  650 mg Oral Q6H PRN Opyd, Ilene Qua, MD       Or   acetaminophen (TYLENOL) suppository 650 mg  650 mg Rectal Q6H PRN Opyd, Ilene Qua, MD       alteplase (CATHFLO ACTIVASE) injection 2 mg  2 mg Intracatheter Once PRN Lynnda Child, PA-C       aspirin EC tablet 81 mg  81 mg Oral Daily Opyd, Timothy S,  MD       atorvastatin (LIPITOR) tablet 80 mg  80 mg Oral q1800 Opyd, Ilene Qua, MD       calcitRIOL (ROCALTROL) capsule 1.25 mcg  1.25 mcg Oral Q M,W,F-HD Opyd, Ilene Qua, MD   1.25 mcg at 07/31/21 1345   calcium carbonate (TUMS - dosed in mg elemental calcium) chewable tablet 500 mg  500 mg Oral QAC supper Opyd, Ilene Qua, MD       carvedilol (COREG) tablet 3.125 mg  3.125 mg Oral BID WC Opyd, Ilene Qua, MD   3.125 mg at 07/31/21 1048   Chlorhexidine Gluconate Cloth 2 % PADS 6 each  6 each Topical Q0600 Lynnda Child, PA-C   6 each at 08/01/21 1130   clopidogrel (PLAVIX) tablet 75 mg  75 mg Oral Daily Opyd, Ilene Qua, MD   75 mg at 07/31/21 1048   heparin injection 1,000 Units  1,000 Units Dialysis PRN Lynnda Child, PA-C       insulin aspart (novoLOG) injection 0-6 Units  0-6 Units Subcutaneous Q4H Opyd, Ilene Qua, MD   3 Units at 07/31/21 2106   insulin glargine-yfgn (SEMGLEE) injection 10 Units  10 Units Subcutaneous QHS Vianne Bulls, MD   10 Units at 07/31/21 2209   isosorbide mononitrate (IMDUR) 24 hr tablet 30 mg  30 mg Oral Daily Opyd, Ilene Qua, MD   30 mg at 07/31/21 1048   lanthanum (FOSRENOL) chewable tablet 2,000 mg  2,000 mg Oral TID WC Opyd, Ilene Qua, MD   2,000 mg at 07/31/21 1344   lidocaine (PF) (XYLOCAINE) 1 % injection 5 mL  5 mL Intradermal PRN Lynnda Child, PA-C       lidocaine-prilocaine (EMLA) cream 1  application  1 application Topical PRN Greely Atiyeh, Thomos Lemons, PA-C       methocarbamol (ROBAXIN) tablet 1,000 mg  1,000 mg Oral QID PRN Opyd, Ilene Qua, MD       nitroGLYCERIN (NITROSTAT) SL tablet 0.4 mg  0.4 mg Sublingual Q5 min PRN Foust, Katy L, NP       pantoprazole (PROTONIX) injection 40 mg  40 mg Intravenous Q12H Swayze, Ava, DO   40 mg at 07/31/21 2209   pentafluoroprop-tetrafluoroeth (GEBAUERS) aerosol 1 application  1 application Topical PRN Lynnda Child, PA-C       sodium chloride flush (NS) 0.9 % injection 3 mL  3 mL Intravenous Q12H Opyd, Ilene Qua, MD   3 mL at 08/01/21 1004     ROS: As per HPI otherwise negative.  Physical Exam: Vitals:   08/01/21 0235 08/01/21 0340 08/01/21 0737 08/01/21 1131  BP: (!) 119/45 (!) 106/50 (!) 120/42 (!) 121/43  Pulse: 84 78 81 84  Resp: 13 17 15 16   Temp: 98 F (36.7 C) 97.9 F (36.6 C) 98 F (36.7 C) 97.9 F (36.6 C)  TempSrc: Oral Oral Oral Oral  SpO2: 99% 99% 99% 99%  Weight:  84.2 kg    Height:         General: Appears comfortable, in no distress  Head: NCAT sclera not icteric MMM Neck: Supple. No JVD appreciated  Lungs: Clear bilaterally without wheezes, rales, or rhonchi. Normal WOB  Heart: RRR, no murmur, rub, or gallop  Abdomen: soft non-tender, bowel sounds normal, no masses  Lower extremities:without edema or ischemic changes, no open wounds  Neuro: A & O X 3. Moves all extremities spontaneously. Psych:  Responds to questions appropriately with a normal affect. Dialysis Access:  Labs:  Basic Metabolic Panel: Recent Labs  Lab 07/31/21 0041 08/01/21 0446  NA 128* 132*  K 3.3* 3.9  CL 90* 94*  CO2 25 21*  GLUCOSE 562* 97  BUN 94* 140*  CREATININE 9.65* 11.85*  CALCIUM 8.5* 8.3*   Liver Function Tests: Recent Labs  Lab 07/31/21 0041  AST 18  ALT 17  ALKPHOS 38  BILITOT 0.6  PROT 6.3*  ALBUMIN 2.9*   No results for input(s): LIPASE, AMYLASE in the last 168 hours. No results for input(s):  AMMONIA in the last 168 hours. CBC: Recent Labs  Lab 07/31/21 0041 07/31/21 2149 08/01/21 0446  WBC 6.6 9.0 10.2  NEUTROABS 3.9  --   --   HGB 6.8* 6.7* 7.2*  HCT 21.2* 19.6* 21.0*  MCV 84.5 82.4 83.0  PLT 323 320 298   Cardiac Enzymes: No results for input(s): CKTOTAL, CKMB, CKMBINDEX, TROPONINI in the last 168 hours. CBG: Recent Labs  Lab 07/31/21 2012 07/31/21 2258 08/01/21 0339 08/01/21 0738 08/01/21 1132  GLUCAP 274* 160* 78 102* 139*   Iron Studies:  Recent Labs    07/31/21 1353  IRON 126  TIBC 225*  FERRITIN 66   Studies/Results: DG Chest Port 1 View  Result Date: 07/31/2021 CLINICAL DATA:  Chest pain EXAM: PORTABLE CHEST 1 VIEW COMPARISON:  06/23/2020 FINDINGS: Mild bibasilar scarring/atelectasis. No focal consolidation. No pleural effusion or pneumothorax. Heart is normal in size. Thoracic aortic atherosclerosis. Postsurgical changes related to prior CABG. Median sternotomy. IMPRESSION: No evidence of acute cardiopulmonary disease. Electronically Signed   By: Julian Hy M.D.   On: 07/31/2021 01:09    Dialysis Orders:  Unit: [place] MWF  Time: 4h  EDW: 75.9kg  Flows: 500/1.5A [place]: 2K/3.5Ca  Access: AVF  Heparin: 2500 U Bolus  ESA: None  VDRA: Calcitriol 0.75  OP Hgb 12.7 on 12/722 >  9.0 on 12/14   Assessment/Plan: Symptomatic anemia/GI bleeding - Transfused 2 units prbcs so far. GI consulted -likely for EGD/colonoscopy, but needs to be off Plavix. Outpatient Hgb trend has been >12 until this week, so was not on ESA. Will give darbe with HD today.  ESRD -   HD MWF. Missed Friday. HD off schedule today  Hypertension/volume  - BP ok/usually hypertensive. UF to EDW as tolerate Metabolic bone disease -  Ca ok, continue Fosrenol binder  Nutrition - Renal diet with fluid restriction DM -  CAD s/p CABG   Lynnda Child PA-C [facility] 08/01/2021, 2:02 PM

## 2021-08-01 NOTE — Procedures (Signed)
° °  I was present at this dialysis session, have reviewed the session itself and made  appropriate changes Kelly Splinter MD [place] pager [phone]   08/01/2021, 3:51 PM

## 2021-08-01 NOTE — Consult Note (Signed)
[facility] Consult: 12:33 PM 08/01/2021  LOS: 0 days    Referring Provider: Dr. Benny Lennert. Primary Care Physician:  Sandi Mariscal, MD at [facility]. Primary Gastroenterologist:  none    Reason for Consultation: Anemia.  No FOBT test so far.       HPI: [name] Manfredo Sr. is a 45 y.o. male.  ESRD, on hemodialysis.  Peripheral vascular disease.  Status post bilateral BKA.  CAD, status post CABG. chronic Plavix and low-dose aspirin.  Acute on chronic anemia.  IDDM. No records of previous EGD or colonoscopy.  The only dedicated GI imaging was a contrasted CTAP in 2016 for abdominal pain, constipation this showed renal cysts, chronic renal failure, extensive arterial vascular calcification No previous colonoscopy or EGD. Has received blood transfusion in the past.  Thinks he has been treated with parenteral iron on a fairly regular basis by his renal MDs.  Not sure if he is getting Epogen injections.  Renal has not yet consulted so I do not have their list of dialysis administered medications.  Presented to the hospital yesterday with symptomatic anemia.  Experiencing dyspnea, substernal chest pain.  He had had vomiting for a 2 to 3 days PTA.  Initially this was bilious but on one occasion it was coffee grounds.  He stopped having daily bowel movements but did not have any melena or blood per rectum.  Last bowel movement was yesterday, it was brown.  He takes Nexium for acid reflux and normally does not have upper GI problems and has a good appetite.  Does not use NSAIDs. Hgb 6.7 >> 2 PRBCs >> 7.2. 12.3 1-year ago.  MCV 83.  126, normal.  TIBC low at 225.  Iron sats elevated at 56%.  Ferritin 66.  Normal folate 16.  Normal B12 400 GFR 5.  BUN/creatinine 140/11.8. Troponins 65, 168, 145.  BNP 289 No acute EKG  changes.  Dr Johnsie Cancel says  pt not a candidate for cardiac catheterization and will considered outpatient Myoview. Last dose of Plavix was on 12/15.  Patient drinks brandy, consumes a pint over a period of 2 weeks. Family history of some sort of intestinal problem in his mother who died with heart failure, end-stage renal disease.  Father had i issues with ulcers.     Past Medical History:  Diagnosis Date   Anemia    Atherosclerosis of lower extremity (HCC)    Blood clot in vein    right calf   CAD (coronary artery disease)    Cataracts, bilateral    Chronic combined systolic and diastolic heart failure (HCC)    Complication of anesthesia    Depression    ESRD (end stage renal disease) on dialysis [facility])    "MWF [place]" (03/08/2017)   GERD (gastroesophageal reflux disease)    Heart murmur    History of blood transfusion    "related to OR"   Hypertension    Myocardial infarction ([place])     " light"   Nonhealing surgical wound    nonviable tissue   PONV (postoperative  nausea and vomiting)    S/P unilateral BKA (below knee amputation), left ([place])    Type II diabetes mellitus ([place])    Wears glasses     Past Surgical History:  Procedure Laterality Date   ABDOMINAL AORTOGRAM N/A 11/02/2016   Procedure: Abdominal Aortogram;  Surgeon: Waynetta Sandy, MD;  Location: [place] [facility];  Service: Cardiovascular;  Laterality: N/A;   ABDOMINAL AORTOGRAM W/LOWER EXTREMITY Right 08/07/2018   Procedure: ABDOMINAL AORTOGRAM W/LOWER EXTREMITY;  Surgeon: [name], MD;  Location: [place] CV LAB;  Service: Cardiovascular;  Laterality: Right;   AMPUTATION Left 09/27/2013   Procedure: LEFT GREAT TOE AMPUTATION;  Surgeon: [name], MD;  Location: [place];  Service: Orthopedics;  Laterality: Left;   AMPUTATION Right 08/15/2015   Procedure: Right Great Toe Amputation;  Surgeon: [name], MD;  Location: [place];  Service: Orthopedics;  Laterality: Right;    AMPUTATION Left 11/05/2016   Procedure: TRANSMETATARSAL AMPUTATION LEFT FOOT;  Surgeon: [name], MD;  Location: [place];  Service: Orthopedics;  Laterality: Left;   AMPUTATION Left 03/11/2017   Procedure: LEFT BELOW KNEE AMPUTATION;  Surgeon: [name], MD;  Location: [place];  Service: Orthopedics;  Laterality: Left;   AMPUTATION Right 03/11/2017   Procedure: RIGHT 2ND TOE AMPUTATION;  Surgeon: [name], MD;  Location: [place];  Service: Orthopedics;  Laterality: Right;   AMPUTATION Right 11/17/2018   Procedure: AMPUTATION BELOW KNEE;  Surgeon: [name], MD;  Location: [place];  Service: Vascular;  Laterality: Right;   AV FISTULA PLACEMENT  left arm   CORONARY ARTERY BYPASS GRAFT N/A 11/04/2017   Procedure: CORONARY ARTERY BYPASS GRAFTING (CABG) x three, using left internal mammary artery and right    leg greater saphenous vein;  Surgeon: Melrose Nakayama, MD;  Location: [place];  Service: Open Heart Surgery;  Laterality: N/A;   FASCIOTOMY Right 08/07/2018   Procedure: FOUR COMPARTMENT FASCIOTOMY OF RIGHT LOWER LEG;  Surgeon: Rosetta Posner, MD;  Location: [place];  Service: Vascular;  Laterality: Right;   FASCIOTOMY CLOSURE Right 08/10/2018   Procedure: FASCIOTOMY CLOSURE RIGHT LOWER EXTREMITY;  Surgeon: [name], MD;  Location: [place];  Service: Vascular;  Laterality: Right;   HEMATOMA EVACUATION Right 08/07/2018   Procedure: EVACUATION HEMATOMA;  Surgeon: Rosetta Posner, MD;  Location: [place];  Service: Vascular;  Laterality: Right;   LEFT HEART CATH AND CORONARY ANGIOGRAPHY N/A 10/31/2017   Procedure: LEFT HEART CATH AND CORONARY ANGIOGRAPHY;  Surgeon: Troy Sine, MD;  Location: [place] [facility];  Service: Cardiovascular;  Laterality: N/A;   LEFT HEART CATHETERIZATION WITH CORONARY ANGIOGRAM N/A 09/13/2014   Procedure: LEFT HEART CATHETERIZATION WITH CORONARY ANGIOGRAM;  Surgeon: Sinclair Grooms, MD;  Location: [facility] CATH LAB;  Service: Cardiovascular;   Laterality: N/A;   LOWER EXTREMITY ANGIOGRAPHY Bilateral 11/02/2016   Procedure: Lower Extremity Angiography;  Surgeon: Waynetta Sandy, MD;  Location: [place] [facility] LAB;  Service: Cardiovascular;  Laterality: Bilateral;   PERIPHERAL VASCULAR ATHERECTOMY Left 11/02/2016   Procedure: Peripheral Vascular Atherectomy;  Surgeon: Waynetta Sandy, MD;  Location: [place] CV LAB;  Service: Cardiovascular;  Laterality: Left;  PERONEAL   PERIPHERAL VASCULAR ATHERECTOMY Right 08/07/2018   Procedure: PERIPHERAL VASCULAR ATHERECTOMY;  Surgeon: [name], MD;  Location: [place] CV LAB;  Service: Cardiovascular;  Laterality: Right;  Peroneal artery   PERIPHERAL VASCULAR BALLOON ANGIOPLASTY Right 08/07/2018   Procedure: PERIPHERAL VASCULAR  BALLOON ANGIOPLASTY;  Surgeon: [name], MD;  Location: [place] CV LAB;  Service: Cardiovascular;  Laterality: Right;  anterior tibial artery   STUMP REVISION Left 01/11/2017   Procedure: Revision Left Transmetatarsal Amputation;  Surgeon: [name], MD;  Location: [place];  Service: Orthopedics;  Laterality: Left;   TEE WITHOUT CARDIOVERSION N/A 11/04/2017   Procedure: TRANSESOPHAGEAL ECHOCARDIOGRAM (TEE);  Surgeon: Melrose Nakayama, MD;  Location: [place];  Service: Open Heart Surgery;  Laterality: N/A;   TRANSMETATARSAL AMPUTATION Right 08/10/2018   Procedure: RIGHT FIFTH TOE AMPUTATION;  Surgeon: [name], MD;  Location: [place];  Service: Vascular;  Laterality: Right;   TRANSMETATARSAL AMPUTATION Right 09/14/2018   Procedure: TRANSMETATARSAL AMPUTATION;  Surgeon: [name], MD;  Location: [place];  Service: Vascular;  Laterality: Right;    Prior to Admission medications   Medication Sig Start Date End Date Taking? Authorizing Provider  acetaminophen (TYLENOL) 325 MG tablet Take 2 tablets (650 mg total) by mouth every 4 (four) hours as needed for headache or mild pain. 09/08/18  Yes Geradine Girt,  DO  aspirin EC 81 MG tablet Take 1 tablet (81 mg total) by mouth daily. 02/07/18  Yes Burtis Junes, NP  atorvastatin (LIPITOR) 80 MG tablet Take 1 tablet (80 mg total) by mouth daily at 6 PM. Patient taking differently: Take 80 mg by mouth daily. 11/28/18  Yes Angiulli, Lavon Paganini, PA-C  calcitRIOL (ROCALTROL) 0.25 MCG capsule Take 5 capsules (1.25 mcg total) by mouth every Monday, Wednesday, and Friday with hemodialysis. 11/09/17  Yes Conte, Tessa N, PA-C  calcium acetate (PHOSLO) 667 MG capsule Take 2,001 mg by mouth 3 (three) times daily with meals. 07/15/21  Yes [provider]  calcium carbonate (TUMS - DOSED IN MG ELEMENTAL CALCIUM) 500 MG chewable tablet Chew 500 mg by mouth daily. 04/25/20  Yes [provider]  carvedilol (COREG) 3.125 MG tablet Take 3.125 mg by mouth 2 (two) times daily. 06/14/20  Yes [provider]  clopidogrel (PLAVIX) 75 MG tablet Take 1 tablet (75 mg total) by mouth daily. 11/28/18  Yes Angiulli, Lavon Paganini, PA-C  esomeprazole (NEXIUM) 40 MG capsule Take 40 mg by mouth daily. 01/17/20  Yes [provider]  Insulin Glargine (LANTUS) 100 UNIT/ML Solostar Pen Inject 14 Units into the skin at bedtime. 11/28/18  Yes Angiulli, Lavon Paganini, PA-C  isosorbide mononitrate (IMDUR) 60 MG 24 hr tablet Take 60 mg by mouth at bedtime. 07/29/21  Yes [provider]  lanthanum (FOSRENOL) 1000 MG chewable tablet Chew 2 tablets (2,000 mg total) by mouth 3 (three) times daily with meals. 11/28/18  Yes Angiulli, Lavon Paganini, PA-C  methocarbamol (ROBAXIN) 500 MG tablet Take 1,000 mg by mouth 4 (four) times daily as needed for muscle spasms. 06/17/20  Yes [provider]  multivitamin (RENA-VIT) TABS tablet Take 1 tablet by mouth at bedtime. Patient taking differently: Take 1 tablet by mouth daily. 08/17/18  Yes Kayleen Memos, DO  nitroGLYCERIN (NITROSTAT) 0.4 MG SL tablet Place 1 tablet (0.4 mg total) under the tongue every 5 (five) minutes as needed for  chest pain. 03/20/19 10/29/21 Yes Josue Hector, MD  Darbepoetin Alfa (ARANESP) 200 MCG/0.4ML SOSY injection Inject 0.4 mLs (200 mcg total) into the vein every Monday with hemodialysis. Patient not taking: Reported on 07/31/2021 12/04/18   Angiulli, Lavon Paganini, PA-C  insulin aspart (NOVOLOG) 100 UNIT/ML FlexPen Inject 3 Units into the skin 3 (three) times daily with meals. Patient  not taking: Reported on 07/31/2021 11/28/18   Angiulli, Lavon Paganini, PA-C  isosorbide mononitrate (IMDUR) 30 MG 24 hr tablet Take 1 tablet (30 mg total) by mouth daily. Patient not taking: Reported on 07/31/2021 11/28/18   Angiulli, Lavon Paganini, PA-C    Scheduled Meds:  sodium chloride   Intravenous Once   aspirin EC  81 mg Oral Daily   atorvastatin  80 mg Oral q1800   calcitRIOL  1.25 mcg Oral Q M,W,F-HD   calcium carbonate  500 mg Oral QAC supper   carvedilol  3.125 mg Oral BID WC   Chlorhexidine Gluconate Cloth  6 each Topical Q0600   clopidogrel  75 mg Oral Daily   insulin aspart  0-6 Units Subcutaneous Q4H   insulin glargine-yfgn  10 Units Subcutaneous QHS   isosorbide mononitrate  30 mg Oral Daily   lanthanum  2,000 mg Oral TID WC   pantoprazole (PROTONIX) IV  40 mg Intravenous Q12H   sodium chloride flush  3 mL Intravenous Q12H   Infusions:  sodium chloride     sodium chloride     sodium chloride     PRN Meds: sodium chloride, sodium chloride, acetaminophen **OR** acetaminophen, alteplase, heparin, lidocaine (PF), lidocaine-prilocaine, methocarbamol, nitroGLYCERIN, pentafluoroprop-tetrafluoroeth   Allergies as of 07/31/2021 - Review Complete 07/31/2021  Allergen Reaction Noted   Coconut oil Anaphylaxis and Other (See Comments) 09/12/2014    Family History  Problem Relation Age of Onset   Heart failure Mother    Hypertension Mother     Social History   Socioeconomic History   Marital status: Legally Separated    Spouse name: Not on file   Number of children: Not on file   Years of education: Not  on file   Highest education level: Not on file  Occupational History   Not on file  Tobacco Use   Smoking status: Former    Packs/day: 0.50    Years: 26.00    Pack years: 13.00    Types: Cigarettes    Quit date: 10/25/2018    Years since quitting: 2.7   Smokeless tobacco: Never  Vaping Use   Vaping Use: Never used  Substance and Sexual Activity   Alcohol use: Not Currently    Comment:  "haven't took a drink in over 5 years"   Drug use: Yes    Types: Marijuana    Comment: occasional   Sexual activity: Yes  Other Topics Concern   Not on file  Social History Narrative   Not on file   Social Determinants of Health   Financial Resource Strain: Not on file  Food Insecurity: Not on file  Transportation Needs: Not on file  Physical Activity: Not on file  Stress: Not on file  Social Connections: Not on file  Intimate Partner Violence: Not on file    REVIEW OF SYSTEMS: Constitutional: Weakness, fatigue ENT:  No nose bleeds Pulm: As of breath CV:  No palpitations, no LE edema.  Angina GU:  No hematuria, no frequency GI: No dysphagia.  See HPI. Heme: Denies unusual or excessive bleeding or bruising. Transfusions: See HPI. Neuro:  No headaches, no peripheral tingling or numbness.  No syncope, no seizures. Derm:  No itching, no rash or sores.  Endocrine:  No sweats or chills.  No polyuria or dysuria Immunization: Reviewed Travel: Not queried.   PHYSICAL EXAM: Vital signs in last 24 hours: Vitals:   08/01/21 0737 08/01/21 1131  BP: (!) 120/42 (!) 121/43  Pulse: 81 84  Resp: 15 16  Temp: 98 F (36.7 C) 97.9 F (36.6 C)  SpO2: 99% 99%   Wt Readings from Last 3 Encounters:  08/01/21 84.2 kg  06/23/20 88 kg  03/15/19 77 kg    General: Patient looks somewhat chronically ill.  Lethargic but arouses easily. Head: No signs of head trauma.  No facial asymmetry. Eyes: No conjunctival pallor or scleral icterus. Ears: Not hard of hearing Nose: No congestion or  discharge Mouth: Some absent teeth.  Tongue midline.  Mucosa is moist, pink, clear. Neck: No masses, no JVD. Lungs: Clear bilaterally.  No labored breathing.  No cough Heart: RRR.  Slight SEM. Abdomen: Not tender, not distended.  No HSM, masses, bruits, hernias.  Bowel sounds active..   Rectal: Deferred. Musc/Skeltl: No joint redness, swelling.  Status post bilateral BKA. Extremities: No CCE.  Fistula left upper extremity Neurologic: Oriented x3. Skin: No rash, no sores, no suspicious lesions. Nodes: No cervical adenopathy Psych: Cooperative.  Normal affect.  Intake/Output from previous day: 12/16 0701 - 12/17 0700 In: 1250 [P.O.:660; I.V.:315; Blood:275] Out: 0  Intake/Output this shift: No intake/output data recorded.  LAB RESULTS: Recent Labs    07/31/21 0041 07/31/21 2149 08/01/21 0446  WBC 6.6 9.0 10.2  HGB 6.8* 6.7* 7.2*  HCT 21.2* 19.6* 21.0*  PLT 323 320 298   BMET Lab Results  Component Value Date   NA 132 (L) 08/01/2021   NA 128 (L) 07/31/2021   NA 133 (L) 06/23/2020   K 3.9 08/01/2021   K 3.3 (L) 07/31/2021   K 5.2 (H) 06/23/2020   CL 94 (L) 08/01/2021   CL 90 (L) 07/31/2021   CL 103 06/23/2020   CO2 21 (L) 08/01/2021   CO2 25 07/31/2021   CO2 9 (L) 06/23/2020   GLUCOSE 97 08/01/2021   GLUCOSE 562 (HH) 07/31/2021   GLUCOSE 176 (H) 06/23/2020   BUN 140 (H) 08/01/2021   BUN 94 (H) 07/31/2021   BUN 83 (H) 06/23/2020   CREATININE 11.85 (H) 08/01/2021   CREATININE 9.65 (H) 07/31/2021   CREATININE 20.55 (H) 06/23/2020   CALCIUM 8.3 (L) 08/01/2021   CALCIUM 8.5 (L) 07/31/2021   CALCIUM 8.7 (L) 06/23/2020   LFT Recent Labs    07/31/21 0041  PROT 6.3*  ALBUMIN 2.9*  AST 18  ALT 17  ALKPHOS 38  BILITOT 0.6   PT/INR Lab Results  Component Value Date   INR 1.1 08/01/2021   INR 1.25 11/04/2017   INR 1.00 10/30/2017   Hepatitis Panel No results for input(s): HEPBSAG, HCVAB, HEPAIGM, HEPBIGM in the last 72 hours. C-Diff No components found  for: CDIFF Lipase     Component Value Date/Time   LIPASE 67 (H) 03/10/2020 0734    Drugs of Abuse  No results found for: LABOPIA, COCAINSCRNUR, LABBENZ, AMPHETMU, THCU, LABBARB   RADIOLOGY STUDIES: DG Chest Port 1 View  Result Date: 07/31/2021 CLINICAL DATA:  Chest pain EXAM: PORTABLE CHEST 1 VIEW COMPARISON:  06/23/2020 FINDINGS: Mild bibasilar scarring/atelectasis. No focal consolidation. No pleural effusion or pneumothorax. Heart is normal in size. Thoracic aortic atherosclerosis. Postsurgical changes related to prior CABG. Median sternotomy. IMPRESSION: No evidence of acute cardiopulmonary disease. Electronically Signed   By: Julian Hy M.D.   On: 07/31/2021 01:09     IMPRESSION:      Anemia of chronic kidney disease.  Contribution of blood loss anemia with reports of CGE following non-CGE/nonbloody emesis earlier this week.  Rule out esophagitis, gastritis, ulcers, AVMs, neoplasia.  Not prodrome GI symptoms.  No previous EGD or colonoscopy.  Takes Nexium daily for GERD.  Completed transfusion 2 PRBCs thus far.     Demand ischemia in setting of anemia.  Plans for possible outpatient Myoview but no cardiac cath.  Previous CABG.     Chronic Plavix, ASA.  Last dose of Plavix was at 1058 yesterday, though officially not on hold, it was not given yet today.    PLAN:        Per Dr. Havery Moros.  If EGD and colonoscopy pursued, would need to be off Plavix for 5 days.  Currently tolerating renal/carb modified diet though not very hungry and not consuming a lot, would leave this in place.   Azucena Freed  08/01/2021, 12:33 PM Phone [phone]

## 2021-08-02 ENCOUNTER — Encounter (HOSPITAL_COMMUNITY): Payer: Self-pay | Admitting: Internal Medicine

## 2021-08-02 ENCOUNTER — Inpatient Hospital Stay (HOSPITAL_COMMUNITY): Payer: Medicaid Other | Admitting: Anesthesiology

## 2021-08-02 ENCOUNTER — Encounter (HOSPITAL_COMMUNITY)
Admission: EM | Disposition: A | Discharge status: Home / Self Care | Payer: Self-pay | Source: Home / Self Care | Attending: Internal Medicine

## 2021-08-02 DIAGNOSIS — K3182 Dieulafoy lesion (hemorrhagic) of stomach and duodenum: Secondary | ICD-10-CM

## 2021-08-02 DIAGNOSIS — K922 Gastrointestinal hemorrhage, unspecified: Secondary | ICD-10-CM

## 2021-08-02 DIAGNOSIS — K2980 Duodenitis without bleeding: Secondary | ICD-10-CM

## 2021-08-02 DIAGNOSIS — K31811 Angiodysplasia of stomach and duodenum with bleeding: Secondary | ICD-10-CM

## 2021-08-02 HISTORY — PX: HOT HEMOSTASIS: SHX5433

## 2021-08-02 HISTORY — PX: HEMOSTASIS CLIP PLACEMENT: SHX6857

## 2021-08-02 HISTORY — PX: ESOPHAGOGASTRODUODENOSCOPY (EGD) WITH PROPOFOL: SHX5813

## 2021-08-02 LAB — CBC WITH DIFFERENTIAL/PLATELET
Abs Immature Granulocytes: 0.04 10*3/uL (ref 0.00–0.07)
Basophils Absolute: 0 10*3/uL (ref 0.0–0.1)
Basophils Relative: 0 %
Eosinophils Absolute: 0.2 10*3/uL (ref 0.0–0.5)
Eosinophils Relative: 2 %
HCT: 18.4 % — ABNORMAL LOW (ref 39.0–52.0)
Hemoglobin: 6.3 g/dL — CL (ref 13.0–17.0)
Immature Granulocytes: 0 %
Lymphocytes Relative: 23 %
Lymphs Abs: 2.3 10*3/uL (ref 0.7–4.0)
MCH: 29.2 pg (ref 26.0–34.0)
MCHC: 34.2 g/dL (ref 30.0–36.0)
MCV: 85.2 fL (ref 80.0–100.0)
Monocytes Absolute: 0.9 10*3/uL (ref 0.1–1.0)
Monocytes Relative: 9 %
Neutro Abs: 6.7 10*3/uL (ref 1.7–7.7)
Neutrophils Relative %: 66 %
Platelets: 309 10*3/uL (ref 150–400)
RBC: 2.16 MIL/uL — ABNORMAL LOW (ref 4.22–5.81)
RDW: 17.7 % — ABNORMAL HIGH (ref 11.5–15.5)
WBC: 10.1 10*3/uL (ref 4.0–10.5)
nRBC: 0 % (ref 0.0–0.2)

## 2021-08-02 LAB — GLUCOSE, CAPILLARY
Glucose-Capillary: 100 mg/dL — ABNORMAL HIGH (ref 70–99)
Glucose-Capillary: 107 mg/dL — ABNORMAL HIGH (ref 70–99)
Glucose-Capillary: 141 mg/dL — ABNORMAL HIGH (ref 70–99)
Glucose-Capillary: 153 mg/dL — ABNORMAL HIGH (ref 70–99)
Glucose-Capillary: 75 mg/dL (ref 70–99)
Glucose-Capillary: 76 mg/dL (ref 70–99)
Glucose-Capillary: 78 mg/dL (ref 70–99)
Glucose-Capillary: 78 mg/dL (ref 70–99)
Glucose-Capillary: 86 mg/dL (ref 70–99)

## 2021-08-02 LAB — PREPARE RBC (CROSSMATCH)

## 2021-08-02 LAB — PHOSPHORUS: Phosphorus: 3.4 mg/dL (ref 2.5–4.6)

## 2021-08-02 SURGERY — ESOPHAGOGASTRODUODENOSCOPY (EGD) WITH PROPOFOL
Anesthesia: Monitor Anesthesia Care

## 2021-08-02 MED ORDER — SODIUM CHLORIDE 0.9 % IV SOLN
INTRAVENOUS | Status: DC
Start: 2021-08-02 — End: 2021-08-02
  Administered 2021-08-02: 12:00:00 500 mL via INTRAVENOUS

## 2021-08-02 MED ORDER — PROPOFOL 500 MG/50ML IV EMUL
INTRAVENOUS | Status: DC | PRN
  Administered 2021-08-02: 175 ug/kg/min via INTRAVENOUS
  Administered 2021-08-02: 125 ug/kg/min via INTRAVENOUS

## 2021-08-02 MED ORDER — ALBUMIN HUMAN 5 % IV SOLN: INTRAVENOUS | Status: DC | PRN

## 2021-08-02 MED ORDER — MIDODRINE HCL 5 MG PO TABS
10.0000 mg | ORAL_TABLET | Freq: Three times a day (TID) | ORAL | Status: DC
  Administered 2021-08-03 – 2021-08-04 (×3): 10 mg via ORAL
  Filled 2021-08-02 (×4): qty 2

## 2021-08-02 MED ORDER — PROPOFOL 10 MG/ML IV BOLUS
INTRAVENOUS | Status: DC | PRN
  Administered 2021-08-02 (×3): 20 mg via INTRAVENOUS
  Administered 2021-08-02: 10 mg via INTRAVENOUS

## 2021-08-02 MED ORDER — VASOPRESSIN 20 UNIT/ML IV SOLN
INTRAVENOUS | Status: DC | PRN
  Administered 2021-08-02 (×3): 1 [IU] via INTRAVENOUS

## 2021-08-02 MED ORDER — SODIUM CHLORIDE 0.9% IV SOLUTION: Freq: Once | INTRAVENOUS | Status: AC

## 2021-08-02 MED ORDER — ONDANSETRON HCL 4 MG/2ML IJ SOLN
4.0000 mg | Freq: Four times a day (QID) | INTRAMUSCULAR | Status: DC | PRN
  Administered 2021-08-02 – 2021-08-03 (×3): 4 mg via INTRAVENOUS
  Filled 2021-08-02 (×3): qty 2

## 2021-08-02 MED ORDER — CALCIUM CHLORIDE 10 % IV SOLN
INTRAVENOUS | Status: DC | PRN
  Administered 2021-08-02: 300 mg via INTRAVENOUS
  Administered 2021-08-02 (×2): 200 mg via INTRAVENOUS
  Administered 2021-08-02: 300 mg via INTRAVENOUS

## 2021-08-02 MED ORDER — SODIUM CHLORIDE 0.9 % IV BOLUS: 500.0000 mL | Freq: Once | INTRAVENOUS | Status: DC

## 2021-08-02 MED ORDER — OXYCODONE HCL 5 MG PO TABS
5.0000 mg | ORAL_TABLET | Freq: Four times a day (QID) | ORAL | Status: DC | PRN
  Administered 2021-08-02 – 2021-08-04 (×4): 5 mg via ORAL
  Filled 2021-08-02 (×4): qty 1

## 2021-08-02 MED ORDER — PHENYLEPHRINE 40 MCG/ML (10ML) SYRINGE FOR IV PUSH (FOR BLOOD PRESSURE SUPPORT)
PREFILLED_SYRINGE | INTRAVENOUS | Status: DC | PRN
  Administered 2021-08-02 (×2): 120 ug via INTRAVENOUS
  Administered 2021-08-02: 80 ug via INTRAVENOUS
  Administered 2021-08-02 (×3): 160 ug via INTRAVENOUS

## 2021-08-02 MED ORDER — LIDOCAINE 2% (20 MG/ML) 5 ML SYRINGE
INTRAMUSCULAR | Status: DC | PRN
Start: 2021-08-02 — End: 2021-08-02
  Administered 2021-08-02: 40 mg via INTRAVENOUS

## 2021-08-02 SURGICAL SUPPLY — 15 items

## 2021-08-02 NOTE — Plan of Care (Signed)
°  Problem: Education: Goal: Knowledge of General Education information will improve Description: Including pain rating scale, medication(s)/side effects and non-pharmacologic comfort measures Outcome: Progressing   Problem: Health Behavior/Discharge Planning: Goal: Ability to manage health-related needs will improve Outcome: Progressing   Problem: Clinical Measurements: Goal: Ability to maintain clinical measurements within normal limits will improve Outcome: Progressing Goal: Will remain free from infection Outcome: Progressing Goal: Diagnostic test results will improve Outcome: Progressing Goal: Respiratory complications will improve Outcome: Progressing Goal: Cardiovascular complication will be avoided Outcome: Progressing   Problem: Coping: Goal: Level of anxiety will decrease Outcome: Progressing   Problem: Elimination: Goal: Will not experience complications related to bowel motility Outcome: Progressing Goal: Will not experience complications related to urinary retention Outcome: Progressing   Problem: Pain Managment: Goal: General experience of comfort will improve Outcome: Progressing   Problem: Safety: Goal: Ability to remain free from injury will improve Outcome: Progressing   Problem: Skin Integrity: Goal: Risk for impaired skin integrity will decrease Outcome: Progressing   Problem: Activity: Goal: Risk for activity intolerance will decrease Outcome: Not Progressing   Problem: Nutrition: Goal: Adequate nutrition will be maintained Outcome: Not Progressing

## 2021-08-02 NOTE — Assessment & Plan Note (Signed)
Nephrology consulted

## 2021-08-02 NOTE — Interval H&P Note (Signed)
History and Physical Interval Note: Patient has had a dark stool yesterday PM and then Hgb drifted further this AM, got a unit of RBC transfusion. He denies any pain. Feels fatigued but otherwise at baseline. I have discussed EGD with him, risks / benefits of this and anesthesia and he wishes to proceed. Last dose of Plavix was 2 days. Further recommendations pending results of this exam  08/02/2021 12:08 PM  [name]  has presented today for surgery, with the diagnosis of anemia, coffee ground enemis, melena.  The various methods of treatment have been discussed with the patient and family. After consideration of risks, benefits and other options for treatment, the patient has consented to  Procedure(s): ESOPHAGOGASTRODUODENOSCOPY (EGD) WITH PROPOFOL (N/A) as a surgical intervention.  The patient's history has been reviewed, patient examined, no change in status, stable for surgery.  I have reviewed the patient's chart and labs.  Questions were answered to the patient's satisfaction.     [place]

## 2021-08-02 NOTE — Anesthesia Preprocedure Evaluation (Addendum)
Anesthesia Evaluation  Patient identified by MRN, date of birth, ID band Patient awake    Reviewed: Allergy & Precautions, NPO status , Patient's Chart, lab work & pertinent test results  History of Anesthesia Complications (+) PONV  Airway Mallampati: II  TM Distance: >3 FB Neck ROM: Full    Dental no notable dental hx.    Pulmonary former smoker,    Pulmonary exam normal breath sounds clear to auscultation       Cardiovascular hypertension, Pt. on home beta blockers + CAD, + Past MI, + CABG, + Peripheral Vascular Disease and +CHF  Normal cardiovascular exam Rhythm:Regular Rate:Normal     Neuro/Psych PSYCHIATRIC DISORDERS Depression negative neurological ROS     GI/Hepatic negative GI ROS, Neg liver ROS,   Endo/Other  diabetes, Insulin Dependent  Renal/GU ESRF and DialysisRenal disease     Musculoskeletal negative musculoskeletal ROS (+)   Abdominal   Peds  Hematology  (+) anemia ,   Anesthesia Other Findings anemia, coffee ground enemis, melena  Reproductive/Obstetrics                            Anesthesia Physical Anesthesia Plan  ASA: 3  Anesthesia Plan: MAC   Post-op Pain Management:    Induction: Intravenous  PONV Risk Score and Plan: 2 and Propofol infusion and Treatment may vary due to age or medical condition  Airway Management Planned: Nasal Cannula  Additional Equipment:   Intra-op Plan:   Post-operative Plan:   Informed Consent: I have reviewed the patients History and Physical, chart, labs and discussed the procedure including the risks, benefits and alternatives for the proposed anesthesia with the patient or authorized representative who has indicated his/her understanding and acceptance.       Plan Discussed with: CRNA  Anesthesia Plan Comments:        Anesthesia Quick Evaluation

## 2021-08-02 NOTE — Op Note (Addendum)
[facility] Patient Name: [name] Procedure Date : 08/02/2021 MRN: [id number] Attending MD: [name]. [name] , MD Date of Birth: 07-17-1976 CSN: [id number] Age: 45 Admit Type: Inpatient Procedure:                Upper GI endoscopy Indications:              Acute post hemorrhagic anemia, Coffee-ground                            emesis, Melena - Plavix 2 days ago Providers:                [name] Lipps P. [name] Moros, MD, [name],                            RN, [name], Technician Referring MD:              Medicines:                Monitored Anesthesia Care Complications:            No immediate complications. Estimated blood loss:                            Minimal. Estimated Blood Loss:     Estimated blood loss was minimal. Procedure:                Pre-Anesthesia Assessment:                           - Prior to the procedure, a History and Physical                            was performed, and patient medications and                            allergies were reviewed. The patient's tolerance of                            previous anesthesia was also reviewed. The risks                            and benefits of the procedure and the sedation                            options and risks were discussed with the patient.                            All questions were answered, and informed consent                            was obtained. Prior Anticoagulants: The patient has                            taken Plavix (clopidogrel), last dose was 2 days  prior to procedure. ASA Grade Assessment: III - A                            patient with severe systemic disease. After                            reviewing the risks and benefits, the patient was                            deemed in satisfactory condition to undergo the                            procedure.                           After obtaining informed consent, the  endoscope was                            passed under direct vision. Throughout the                            procedure, the patient's blood pressure, pulse, and                            oxygen saturations were monitored continuously. The                            [id number]-H190 ([id number]) Olympus endoscope was introduced                            through the mouth, and advanced to the second part                            of duodenum. The upper GI endoscopy was                            accomplished without difficulty. The patient                            tolerated the procedure well. Scope In: Scope Out: Findings:      The Z-line was regular.      The exam of the esophagus was otherwise normal.      The entire examined stomach was normal.      Patchy inflammation characterized by erythema was found in the duodenal       bulb, consistent with benign duodenitis.      Red blood was found in the duodenal bulb and in the second portion of       the duodenum.      Following lavage a single small dieulafoy lesion with active bleeding       was found in the duodenal sweep. Fulguration to stop the bleeding by       argon plasma was successful with immediate hemostasis. Two hemostatic       clips were successfully placed across the area (photo only shows the  first clip however).      The exam of the duodenum was otherwise normal. Impression:               - Z-line regular.                           - Normal esophagus otherwise                           - Normal stomach.                           - Duodenitis of the duodenal bulb.                           - Blood in the duodenal bulb and in the second                            portion of the duodenum.                           - A single bleeding dieulafoy lesion in the                            duodenal sweep. Treated with argon plasma                            coagulation (APC). Clips were placed. Recommendation:           -  Patient has a contact number available for                            emergencies. The signs and symptoms of potential                            delayed complications were discussed with the                            patient. Return to normal activities tomorrow.                            Written discharge instructions were provided to the                            patient.                           - Clear liquid diet okay for today, then soft diet                            tomorrow.                           - Continue present medications.                           - Continue to hold Plavix today                           -  Trend Hgb, monitor for recurrent bleeding                           - Our service will reassess him in the morning Procedure Code(s):        --- Professional ---                           [phone], Esophagogastroduodenoscopy, flexible,                            transoral; with control of bleeding, any method Diagnosis Code(s):        --- Professional ---                           K29.80, Duodenitis without bleeding                           K92.2, Gastrointestinal hemorrhage, unspecified                           K31.811, Angiodysplasia of stomach and duodenum                            with bleeding                           D62, Acute posthemorrhagic anemia                           K92.0, Hematemesis                           K92.1, Melena (includes Hematochezia) CPT copyright 2019 American Medical Association. All rights reserved. The codes documented in this report are preliminary and upon coder review may  be revised to meet current compliance requirements. [name] Lipps P. [name] Frick, MD 08/02/2021 12:55:16 PM This report has been signed electronically. Number of Addenda: 0

## 2021-08-02 NOTE — Assessment & Plan Note (Addendum)
S/P transfusion with 2 units PRBC's. Plavix held. GI consulted. Anemia panel ordered. The patient does have an element of chronic anemia due to his chronic renal disease. Monitor H&H.

## 2021-08-02 NOTE — Progress Notes (Signed)
PROGRESS NOTE  [name] Sr. PRF:[id number] DOB: 03-10-1976 DOA: 07/31/2021 PCP: Sandi Mariscal, MD  Brief History   The patient is a 45 yr old man who presented to [facility] ED with complaints of chest pain, dyspnea, and fatigue. Chest pain and dyspnea occurred with exertion initially, then chest pain would occur at rest. The pain was 7/10 in severity worse with exertion, but present at rest. It radiated from his left chest to the left shoulder. Besides dyspnea it was also associated with nausea. He had emesis x 3 on the way to the ED. No hematemesis or abdominal pain.   The patient also reported having a black stool on 07/28/2021 with normal stools following. He is on antithrombotics, but no NSAIDS.   The patient has a medical history of ESRD on HD, DVT, HTN, HLD, IDDM Type 1, CAD s/p CABG, left BKA and Right partial transmetatarsal amputation.   The patient's pain resolved after receiving ASA 324 mg and NTG x 2 prior to arrival at ED.    In the ED his Hgb was 6.8.last recorded hemoglobin on the patient was from last year and was 12.3. Glucose was elevated at 562 and K of 3.3 BNP was 289 with troponins of 45 then 65 and this morning 168.   He has been transfused with 2 units PRBC's. No further chest pain.   EKG demonstrates downgoing T's in lateral leads indicative of  Ischemia vs repolarization abnormality.   This patient was admitted earlier this am by my colleague, Dr. Myna Hidalgo.   Vitals and labwork reviewed and stable.   Patient is awake, alert, and oriented x 3. No acute distress. Denies further chest pain. Heart and lung sounds are within normal limits. Abdomen is soft, non-tender, non-distended. Extremities are negative for cyanosis, clubbing, or edema. He is moving all extremities.    Cardiology has been consulted. The patient has been evaluated by Dr. Johnsie Cancel. The patient is not deemed a candidate for LHC. He recommends continuation of statin, betablocker, and ASA if possible with  risk stratification as outpatient with myoview. He also encourages glucose control and follow up with Dr. Donzetta Matters regarding his amputation.   GI has been consulted. FOBT has been ordered. Hgb will be followed. Nephrology has been consulted as the patient is ESRD on HD MWF. However he missed HD on Friday. He had HD off of schedule today.  GI plans for EGD soon. Plavix has been held.   Consultants  Gastroenterology Cardiology Nephrology  Procedures  HD  Antibiotics   Anti-infectives (From admission, onward)    None      Subjective  The patient is resting comfortably. No new complaints.  Objective   Vitals:  Vitals:   08/02/21 0403 08/02/21 0740  BP: (!) 85/58 (!) 93/41  Pulse: 76 78  Resp: 15 16  Temp: 98.4 F (36.9 C) 98.1 F (36.7 C)  SpO2: 100% 98%    Exam:  Constitutional:  The patient is awake, alert, and oriented x 3. No acute distress. Respiratory:  No increased work of breathing. No wheezes, rales, or rhonchi No tactile fremitus Cardiovascular:  Regular rate and rhythm No murmurs, ectopy, or gallups. No lateral PMI. No thrills. Abdomen:  Abdomen is soft, non-tender, non-distended No hernias, masses, or organomegaly Normoactive bowel sounds.  Musculoskeletal:  No cyanosis, clubbing, or edema Skin:  No rashes, lesions, ulcers palpation of skin: no induration or nodules Neurologic:  CN 2-12 intact Sensation all 4 extremities intact Psychiatric:  Mental status Mood, affect  appropriate Orientation to person, place, time  judgment and insight appear intact   I have personally reviewed the following:   Today's Data  Vitals  Lab Data  BMP, CBC, Anemia panel  Micro Data  Negative for MRSA  Imaging  CXR  Cardiology Data  EKG  Scheduled Meds:  sodium chloride   Intravenous Once   aspirin EC  81 mg Oral Daily   atorvastatin  80 mg Oral q1800   calcitRIOL  1.25 mcg Oral Q M,W,F-HD   calcium carbonate  500 mg Oral QAC supper    carvedilol  3.125 mg Oral BID WC   Chlorhexidine Gluconate Cloth  6 each Topical Q0600   darbepoetin (ARANESP) injection - DIALYSIS  60 mcg Intravenous Q Sat-HD   insulin aspart  0-6 Units Subcutaneous Q4H   insulin glargine-yfgn  10 Units Subcutaneous QHS   isosorbide mononitrate  30 mg Oral Daily   lanthanum  2,000 mg Oral TID WC   pantoprazole (PROTONIX) IV  40 mg Intravenous Q12H   sodium chloride flush  3 mL Intravenous Q12H   Continuous Infusions:  sodium chloride      Principal Problem:   Symptomatic anemia Active Problems:   ESRD on dialysis (HCC)   Elevated troponin   CAD (coronary artery disease)   ESRD (end stage renal disease) on dialysis (HCC)   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 Symptomatic anemia  - Presents with fatigue, DOE, and chest discomfort and is found to have Hgb 6.8, down from 12.3 in Nov 2021  - He reports having a dark stool on 12/13 and subsequent normal stools, denies abd pain or hematemesis  - He refused DRE in ED  - Transfuse RBC, check post-transfusion H&H, continue daily PPI   -GI has been consulted. Plan is for EGD tomorrow.   2. Chest pain; CAD  - Pt with CAD s/p CABG in 2019 presents with chest pain that resolved pta  - Troponin was 45 then 65 in ED; EKG with repolarization abnormality  - He had severe stenosis of branch vessels that could not be bypassed in 2019 and may have some demand ischemia in setting of severe anemia  - Transfuse RBC, cautiously continue ASA and Plavix, continue Lipitor, Coreg, and Imdur    3. Uncontrolled IDDM  - Serum glucose 562 in ED without DKA; A1c was 8.2% a yr ago  - He was given insulin in ED  - Continue CBG checks and insulin     4. ESRD  - Reports completing HD on 07/29/21  - No indication for urgent HD on admission  - Renally-dose medications, monitor   - Nephrology consulted.     DVT prophylaxis: Avoid pharmacologic ppx for now  in setting of possible GIB; SCD precluded by b/l BKA  Code Status: Full  Level of Care: Level of care: Telemetry Cardiac Family Communication: None present  Disposition Plan:  Patient is from: home  Anticipated d/c is to: Home  Anticipated d/c date is: 08/01/21 Patient currently: pending FOBT, post-transfusion H&H, repeat troponin   Karder Goodin, DO [facility] Direct contact: see [email or web address]  7PM-7AM contact night coverage as above 08/01/2021, 6:45 PM  LOS: 1 day

## 2021-08-02 NOTE — Anesthesia Postprocedure Evaluation (Signed)
Anesthesia Post Note  Patient: Hashem Goynes Sr.  Procedure(s) Performed: ESOPHAGOGASTRODUODENOSCOPY (EGD) WITH PROPOFOL HEMOSTASIS CLIP PLACEMENT HOT HEMOSTASIS (ARGON PLASMA COAGULATION/BICAP)     Patient location during evaluation: PACU Anesthesia Type: MAC Level of consciousness: awake Pain management: pain level controlled Vital Signs Assessment: post-procedure vital signs reviewed and stable Respiratory status: spontaneous breathing, nonlabored ventilation, respiratory function stable and patient connected to nasal cannula oxygen Cardiovascular status: stable and blood pressure returned to baseline Postop Assessment: no apparent nausea or vomiting Anesthetic complications: no   No notable events documented.  Last Vitals:  Vitals:   08/02/21 1315 08/02/21 1400  BP: (!) 112/34 111/83  Pulse: 77 83  Resp: 15 12  Temp:  36.4 C  SpO2: 98% 97%    Last Pain:  Vitals:   08/02/21 1400  TempSrc: Oral  PainSc: 0-No pain                 Elgar Scoggins P Deklyn Gibbon

## 2021-08-02 NOTE — Transfer of Care (Signed)
Immediate Anesthesia Transfer of Care Note  Patient: [name] Dase Sr.  Procedure(s) Performed: ESOPHAGOGASTRODUODENOSCOPY (EGD) WITH PROPOFOL HEMOSTASIS CLIP PLACEMENT HOT HEMOSTASIS (ARGON PLASMA COAGULATION/BICAP)  Patient Location: PACU  Anesthesia Type:MAC  Level of Consciousness: awake, alert  and patient cooperative  Airway & Oxygen Therapy: Patient Spontanous Breathing  Post-op Assessment: Report given to RN and Post -op Vital signs reviewed and stable  Post vital signs: Reviewed and stable  Last Vitals:  Vitals Value Taken Time  BP 106/45 08/02/21 1300  Temp 36.1 C 08/02/21 1259  Pulse 79 08/02/21 1300  Resp 19 08/02/21 1300  SpO2 100 % 08/02/21 1300  Vitals shown include unvalidated device data.  Last Pain:  Vitals:   08/02/21 1153  TempSrc: Temporal  PainSc: 0-No pain      Patients Stated Pain Goal: 2 (77/41/28 [id number])  Complications: No notable events documented.

## 2021-08-02 NOTE — Progress Notes (Signed)
1 unit PRBC ordered due to critical lab value hemoglobin 6.3---EDG was delayed a few hours so that blood admin could begin in patient's room before going to EGD procedure---blood was completed and stopped in OR, was not running when patient went to PACU, but I could not find a person who knew exactly what time the blood was completed--I have placed green blood form in the patients chart with this same note and also ordered an H&H to be drawn at 1600 today to recheck hemoglobin value

## 2021-08-02 NOTE — Anesthesia Postprocedure Evaluation (Signed)
Anesthesia Post Note  Patient: [name] Faulcon Sr.  Procedure(s) Performed: ESOPHAGOGASTRODUODENOSCOPY (EGD) WITH PROPOFOL HEMOSTASIS CLIP PLACEMENT HOT HEMOSTASIS (ARGON PLASMA COAGULATION/BICAP)     Patient location during evaluation: PACU Anesthesia Type: MAC Level of consciousness: awake Pain management: pain level controlled Vital Signs Assessment: post-procedure vital signs reviewed and stable Respiratory status: spontaneous breathing, nonlabored ventilation, respiratory function stable and patient connected to nasal cannula oxygen Cardiovascular status: stable and blood pressure returned to baseline Postop Assessment: no apparent nausea or vomiting Anesthetic complications: no   No notable events documented.  Last Vitals:  Vitals:   08/02/21 1315 08/02/21 1400  BP: (!) 112/34 111/83  Pulse: 77 83  Resp: 15 12  Temp:  36.4 C  SpO2: 98% 97%    Last Pain:  Vitals:   08/02/21 1400  TempSrc: Oral  PainSc: 0-No pain                 Rashaunda Rahl P Akari Crysler

## 2021-08-02 NOTE — Assessment & Plan Note (Signed)
With elevated troponin. Cardiology consulted. Felt to be due to severe anemia and renal failure. Monitor.

## 2021-08-02 NOTE — Progress Notes (Signed)
[facility] Progress Note  Subjective: seen in room, c/o being hungry (awaiting EGD procedure), no SOB or swelling  Vitals:   08/01/21 2300 08/02/21 0403 08/02/21 0520 08/02/21 0740  BP: (!) 99/49 (!) 85/58  (!) 93/41  Pulse: 81 76  78  Resp: 20 15  16   Temp: 98.3 F (36.8 C) 98.4 F (36.9 C)  98.1 F (36.7 C)  TempSrc: Oral Oral  Oral  SpO2: 100% 100%  98%  Weight:   83.8 kg   Height:        Exam: alert, nad   no jvd  Chest cta bilat  Cor reg no RG  Abd soft ntnd no ascites   Ext no LE edema   Alert, NF, ox3    LUA AVF +bruit    OP HD: MWF [facility]   4h  75.9kg  500/1.5A  2K/3.5Ca bath  Hep 2500  LUA AVF  - calcitriol 0.75 tiw    CXR - no active disease  Assessment/ Plan: Symptomatic anemia/GI bleeding - SP 2 units prbcs 12/17. Seen by GI, plan for EGD today.  At OP HD Hb had just dropped in the past week from 12 > 9.0, so he was not getting ESA.  Anemia ckd - ordered darbe 60ug weekly on Sat, given 12/17. Tsat high at 56%, no IV iron needed.  ESRD -   HD MWF. Missed Friday, had HD here on Sat. Next HD Monday. Hypertension/volume - BP's soft, usually high. Up 7kg by wts, standing wt confirmed 7kg over. CXR clear. No vol^ by exam. Will give NS 500cc x 1 for BP 80's. Adding midodrine 10 tid. Will dc coreg and imdur for now.  Metabolic bone disease -  Ca w/in range, continue Fosrenol binder , add on phos Nutrition - Renal diet with fluid restriction DM - per pmd CAD s/p CABG      Rob Lenford Beddow 08/02/2021, 8:29 AM   Recent Labs  Lab 07/31/21 0041 07/31/21 2149 08/01/21 0446  K 3.3*  --  3.9  BUN 94*  --  140*  CREATININE 9.65*  --  11.85*  CALCIUM 8.5*  --  8.3*  HGB 6.8* 6.7* 7.2*   Inpatient medications:  sodium chloride   Intravenous Once   aspirin EC  81 mg Oral Daily   atorvastatin  80 mg Oral q1800   calcitRIOL  1.25 mcg Oral Q M,W,F-HD   calcium carbonate  500 mg Oral QAC supper   carvedilol  3.125 mg Oral BID WC   Chlorhexidine  Gluconate Cloth  6 each Topical Q0600   darbepoetin (ARANESP) injection - DIALYSIS  60 mcg Intravenous Q Sat-HD   insulin aspart  0-6 Units Subcutaneous Q4H   insulin glargine-yfgn  10 Units Subcutaneous QHS   isosorbide mononitrate  30 mg Oral Daily   lanthanum  2,000 mg Oral TID WC   pantoprazole (PROTONIX) IV  40 mg Intravenous Q12H   sodium chloride flush  3 mL Intravenous Q12H    sodium chloride     acetaminophen **OR** acetaminophen, methocarbamol, nitroGLYCERIN, ondansetron (ZOFRAN) IV, oxyCODONE

## 2021-08-03 ENCOUNTER — Encounter (HOSPITAL_COMMUNITY): Payer: Self-pay | Admitting: Gastroenterology

## 2021-08-03 DIAGNOSIS — R933 Abnormal findings on diagnostic imaging of other parts of digestive tract: Secondary | ICD-10-CM

## 2021-08-03 DIAGNOSIS — D649 Anemia, unspecified: Secondary | ICD-10-CM

## 2021-08-03 DIAGNOSIS — K6381 Dieulafoy lesion of intestine: Secondary | ICD-10-CM

## 2021-08-03 LAB — RENAL FUNCTION PANEL
Albumin: 3.1 g/dL — ABNORMAL LOW (ref 3.5–5.0)
Albumin: 3 g/dL — ABNORMAL LOW (ref 3.5–5.0)
Anion gap: 16 — ABNORMAL HIGH (ref 5–15)
Anion gap: 8 (ref 5–15)
BUN: 89 mg/dL — ABNORMAL HIGH (ref 6–20)
BUN: 35 mg/dL — ABNORMAL HIGH (ref 6–20)
CO2: 22 mmol/L (ref 22–32)
CO2: 28 mmol/L (ref 22–32)
Calcium: 7.9 mg/dL — ABNORMAL LOW (ref 8.9–10.3)
Calcium: 7.8 mg/dL — ABNORMAL LOW (ref 8.9–10.3)
Chloride: 92 mmol/L — ABNORMAL LOW (ref 98–111)
Chloride: 96 mmol/L — ABNORMAL LOW (ref 98–111)
Creatinine, Ser: 10.18 mg/dL — ABNORMAL HIGH (ref 0.61–1.24)
Creatinine, Ser: 5.79 mg/dL — ABNORMAL HIGH (ref 0.61–1.24)
GFR, Estimated: 6 mL/min — ABNORMAL LOW (ref >60)
GFR, Estimated: 12 mL/min — ABNORMAL LOW (ref >60)
Glucose, Bld: 76 mg/dL (ref 70–99)
Glucose, Bld: 179 mg/dL — ABNORMAL HIGH (ref 70–99)
Phosphorus: 5 mg/dL — ABNORMAL HIGH (ref 2.5–4.6)
Phosphorus: 3.4 mg/dL (ref 2.5–4.6)
Potassium: 3.8 mmol/L (ref 3.5–5.1)
Potassium: 3.9 mmol/L (ref 3.5–5.1)
Sodium: 130 mmol/L — ABNORMAL LOW (ref 135–145)
Sodium: 132 mmol/L — ABNORMAL LOW (ref 135–145)

## 2021-08-03 LAB — CBC
HCT: 19.5 % — ABNORMAL LOW (ref 39.0–52.0)
Hemoglobin: 6.4 g/dL — CL (ref 13.0–17.0)
MCH: 28.3 pg (ref 26.0–34.0)
MCHC: 32.8 g/dL (ref 30.0–36.0)
MCV: 86.3 fL (ref 80.0–100.0)
Platelets: 295 10*3/uL (ref 150–400)
RBC: 2.26 MIL/uL — ABNORMAL LOW (ref 4.22–5.81)
RDW: 17.3 % — ABNORMAL HIGH (ref 11.5–15.5)
WBC: 9 10*3/uL (ref 4.0–10.5)
nRBC: 0 % (ref 0.0–0.2)

## 2021-08-03 LAB — GLUCOSE, CAPILLARY
Glucose-Capillary: 141 mg/dL — ABNORMAL HIGH (ref 70–99)
Glucose-Capillary: 76 mg/dL (ref 70–99)
Glucose-Capillary: 78 mg/dL (ref 70–99)
Glucose-Capillary: 189 mg/dL — ABNORMAL HIGH (ref 70–99)
Glucose-Capillary: 224 mg/dL — ABNORMAL HIGH (ref 70–99)
Glucose-Capillary: 96 mg/dL (ref 70–99)

## 2021-08-03 LAB — TYPE AND SCREEN
ABO/RH(D): B POS
Antibody Screen: NEGATIVE

## 2021-08-03 LAB — PREPARE RBC (CROSSMATCH)

## 2021-08-03 LAB — HEMOGLOBIN AND HEMATOCRIT, BLOOD
HCT: 26 % — ABNORMAL LOW (ref 39.0–52.0)
Hemoglobin: 8.8 g/dL — ABNORMAL LOW (ref 13.0–17.0)

## 2021-08-03 MED ORDER — SODIUM CHLORIDE 0.9% IV SOLUTION: Freq: Once | INTRAVENOUS | Status: DC

## 2021-08-03 NOTE — Progress Notes (Signed)
° °   Progress Note   Subjective  Chief Complaint: Anemia, status post EGD 12/18 with APC to Dieulafoy lesion in the duodenum  Today, patient is found in hemodialysis.  Tells me that he really wants to go home.  Denies any further bowel movements overnight, no abdominal pain.   Objective   Vital signs in last 24 hours: Temp:  [97 F (36.1 C)-98.3 F (36.8 C)] 97.5 F (36.4 C) (12/19 0857) Pulse Rate:  [65-83] 67 (12/19 1130) Resp:  [7-18] 13 (12/19 1130) BP: (104-142)/(25-83) 127/32 (12/19 1130) SpO2:  [91 %-100 %] 95 % (12/19 0857) Weight:  [83 kg-85 kg] 83 kg (12/19 0857) Last BM Date: 08/01/21 General:    AA male in NAD Heart:  Regular rate and rhythm; no murmurs Lungs: Respirations even and unlabored, lungs CTA bilaterally Abdomen:  Soft, nontender and nondistended. Normal bowel sounds. Psych:  Cooperative. Normal mood and affect.  Intake/Output from previous day: 12/18 0701 - 12/19 0700 In: 1698 [P.O.:480; I.V.:403; Blood:315; IV Piggyback:500] Out: 20 [Blood:20]  Lab Results: Recent Labs    08/01/21 0446 08/02/21 0905 08/03/21 1013  WBC 10.2 10.1 9.0  HGB 7.2* 6.3* 6.4*  HCT 21.0* 18.4* 19.5*  PLT 298 309 295   BMET Recent Labs    08/01/21 0446 08/03/21 1013  NA 132* 130*  K 3.9 3.8  CL 94* 92*  CO2 21* 22  GLUCOSE 97 76  BUN 140* 89*  CREATININE 11.85* 10.18*  CALCIUM 8.3* 7.9*   LFT Recent Labs    08/03/21 1013  ALBUMIN 3.1*   PT/INR Recent Labs    08/01/21 0446  LABPROT 14.5  INR 1.1    Assessment / Plan:   Assessment: 1.  Acute on chronic anemia: Patient has anemia of chronic kidney disease, reports of coffee-ground emesis at admission, EGD with bleeding Dieulafoy lesion cauterized, hemoglobin stable overnight 6.3--> 6.4 2.  Chronic Plavix, ASA: For CAD status post CABG  Plan: 1.  Continue to monitor hemoglobin with transfusion as needed less than 7. Recommend transfusing another unit of PRBCs today. 2.  Would still hold Plavix for  now, can consider restarting tomorrow 3.  Started patient on renal diet today.  Thank you for kind consultation, we will continue to follow.    LOS: 2 days   Levin Erp  08/03/2021, 11:53 AM

## 2021-08-03 NOTE — Progress Notes (Signed)
PROGRESS NOTE  [name] Chalfin Sr. KJZ:[id number] DOB: [birth year] DOA: 07/31/2021 PCP: Sandi Mariscal, MD  Brief History   The patient is a 45 yr old man who presented to [facility] ED with complaints of chest pain, dyspnea, and fatigue. Chest pain and dyspnea occurred with exertion initially, then chest pain would occur at rest. The pain was 7/10 in severity worse with exertion, but present at rest. It radiated from his left chest to the left shoulder. Besides dyspnea it was also associated with nausea. He had emesis x 3 on the way to the ED. No hematemesis or abdominal pain.   The patient also reported having a black stool on 07/28/2021 with normal stools following. He is on antithrombotics, but no NSAIDS.   The patient has a medical history of ESRD on HD, DVT, HTN, HLD, IDDM Type 1, CAD s/p CABG, left BKA and Right partial transmetatarsal amputation.   The patient's pain resolved after receiving ASA 324 mg and NTG x 2 prior to arrival at ED.    In the ED his Hgb was 6.8.last recorded hemoglobin on the patient was from last year and was 12.3. Glucose was elevated at 562 and K of 3.3 BNP was 289 with troponins of 45 then 65 and this morning 168.   He has been transfused with 2 units PRBC's. No further chest pain.   EKG demonstrates downgoing T's in lateral leads indicative of  Ischemia vs repolarization abnormality.   This patient was admitted earlier this am by my colleague, Dr. Myna Hidalgo.   Vitals and labwork reviewed and stable.   Patient is awake, alert, and oriented x 3. No acute distress. Denies further chest pain. Heart and lung sounds are within normal limits. Abdomen is soft, non-tender, non-distended. Extremities are negative for cyanosis, clubbing, or edema. He is moving all extremities.    Cardiology has been consulted. The patient has been evaluated by Dr. Johnsie Cancel. The patient is not deemed a candidate for LHC. He recommends continuation of statin, betablocker, and ASA if possible with  risk stratification as outpatient with myoview. He also encourages glucose control and follow up with Dr. Donzetta Matters regarding his amputation.   GI has been consulted. FOBT has been ordered. Hgb will be followed. Nephrology has been consulted as the patient is ESRD on HD MWF. However he missed HD on Friday. He had HD off of schedule today.  GI plans for EGD soon. Plavix has been held.   Consultants  Gastroenterology Cardiology Nephrology  Procedures  HD  Antibiotics   Anti-infectives (From admission, onward)    None      Subjective  The patient is resting comfortably. No new complaints.  Objective   Vitals:  Vitals:   08/03/21 1515 08/03/21 1544  BP:  (!) 139/59  Pulse: 81 76  Resp: 17 17  Temp: 98.5 F (36.9 C) 98.1 F (36.7 C)  SpO2: 96%     Exam:  Constitutional:  The patient is awake, alert, and oriented x 3. No acute distress. Respiratory:  No increased work of breathing. No wheezes, rales, or rhonchi No tactile fremitus Cardiovascular:  Regular rate and rhythm No murmurs, ectopy, or gallups. No lateral PMI. No thrills. Abdomen:  Abdomen is soft, non-tender, non-distended No hernias, masses, or organomegaly Normoactive bowel sounds.  Musculoskeletal:  No cyanosis, clubbing, or edema Skin:  No rashes, lesions, ulcers palpation of skin: no induration or nodules Neurologic:  CN 2-12 intact Sensation all 4 extremities intact Psychiatric:  Mental status Mood, affect appropriate  Orientation to person, place, time  judgment and insight appear intact   I have personally reviewed the following:   Today's Data  Vitals  Lab Data  BMP, CBC, Anemia panel  Micro Data  Negative for MRSA  Imaging  CXR  Cardiology Data  EKG  Scheduled Meds:  sodium chloride   Intravenous Once   sodium chloride   Intravenous Once   aspirin EC  81 mg Oral Daily   atorvastatin  80 mg Oral q1800   calcitRIOL  1.25 mcg Oral Q M,W,F-HD   calcium carbonate  500 mg  Oral QAC supper   Chlorhexidine Gluconate Cloth  6 each Topical Q0600   darbepoetin (ARANESP) injection - DIALYSIS  60 mcg Intravenous Q Sat-HD   insulin aspart  0-6 Units Subcutaneous Q4H   insulin glargine-yfgn  10 Units Subcutaneous QHS   lanthanum  2,000 mg Oral TID WC   midodrine  10 mg Oral TID WC   pantoprazole (PROTONIX) IV  40 mg Intravenous Q12H   sodium chloride flush  3 mL Intravenous Q12H   Continuous Infusions:  sodium chloride     sodium chloride      Principal Problem:   Symptomatic anemia Active Problems:   Elevated troponin   CAD (coronary artery disease)   ESRD (end stage renal disease) on dialysis (HCC)   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 Symptomatic anemia  - Presents with fatigue, DOE, and chest discomfort and is found to have Hgb 6.8, down from 12.3 in Nov 2021  - Hemoglobin dropped to 6.3 overnight. He has received a second unit of blood.  - He reports having a dark stool on 12/13 and subsequent normal stools, denies abd pain or hematemesis  - He refused DRE in ED  - Transfuse RBC, check post-transfusion H&H, continue daily PPI   -EGD has demonstrated bleeding Dieulafoy lesion cauterized -Continue to hold Plavix  2. GI Bleed:  EGD demonstrated a bleeding Dieulafoy lesion cauterized. Continue to follow Hgb.   2. Chest pain; CAD  - Pt with CAD s/p CABG in 2019 presents with chest pain that resolved pta  - Troponin was 45 then 65 in ED; EKG with repolarization abnormality  - He had severe stenosis of branch vessels that could not be bypassed in 2019 and may have some demand ischemia in setting of severe anemia  - Transfuse RBC, cautiously continue ASA , continue Lipitor, Coreg, and Imdur  - Plavix held   3. Uncontrolled IDDM  - Serum glucose 562 in ED without DKA; A1c was 8.2% a yr ago  - He was given insulin in ED  - Continue CBG checks and insulin      4. ESRD  - Reports completing HD on 07/29/21  - No indication for urgent HD on admission  - Renally-dose medications, monitor   - Nephrology consulted. - patient went for HD today.  I have seen and examined this patient myself. I have spent 34 minutes in his evaluation and care.   DVT prophylaxis: Avoid pharmacologic ppx for now in setting of possible GIB; SCD precluded by b/l BKA  Code Status: Full  Level of Care: Level of care: Telemetry Cardiac Family Communication: None present  Disposition Plan:  Patient is from: home  Anticipated d/c is to: Home  Anticipated d/c date is: 08/05/21 Patient currently: pending  FOBT, post-transfusion H&H, repeat troponin  Jasie Meleski, DO [facility] Direct contact: see [email or web address]  7PM-7AM contact night coverage as above 08/03/2021, 6:14 PM  LOS: 1 day

## 2021-08-03 NOTE — Plan of Care (Signed)
  Problem: Education: Goal: Knowledge of General Education information will improve Description: Including pain rating scale, medication(s)/side effects and non-pharmacologic comfort measures Outcome: Progressing   Problem: Health Behavior/Discharge Planning: Goal: Ability to manage health-related needs will improve Outcome: Progressing   Problem: Clinical Measurements: Goal: Ability to maintain clinical measurements within normal limits will improve Outcome: Progressing Goal: Diagnostic test results will improve Outcome: Progressing   Problem: Pain Managment: Goal: General experience of comfort will improve Outcome: Progressing   

## 2021-08-03 NOTE — Progress Notes (Signed)
PROGRESS NOTE  [name]. YBO:[id number] DOB: 11/14/1975 DOA: 07/31/2021 PCP: Sandi Mariscal, MD  Brief History   The patient is a 45 yr old man who presented to [facility] ED with complaints of chest pain, dyspnea, and fatigue. Chest pain and dyspnea occurred with exertion initially, then chest pain would occur at rest. The pain was 7/10 in severity worse with exertion, but present at rest. It radiated from his left chest to the left shoulder. Besides dyspnea it was also associated with nausea. He had emesis x 3 on the way to the ED. No hematemesis or abdominal pain.   The patient also reported having a black stool on 07/28/2021 with normal stools following. He is on antithrombotics, but no NSAIDS.   The patient has a medical history of ESRD on HD, DVT, HTN, HLD, IDDM Type 1, CAD s/p CABG, left BKA and Right partial transmetatarsal amputation.   The patient's pain resolved after receiving ASA 324 mg and NTG x 2 prior to arrival at ED.    In the ED his Hgb was 6.8.last recorded hemoglobin on the patient was from last year and was 12.3. Glucose was elevated at 562 and K of 3.3 BNP was 289 with troponins of 45 then 65 and this morning 168.   He has been transfused with 2 units PRBC's. No further chest pain.   EKG demonstrates downgoing T's in lateral leads indicative of  Ischemia vs repolarization abnormality.   This patient was admitted earlier this am by my colleague, Dr. Myna Hidalgo.   Vitals and labwork reviewed and stable.   Patient is awake, alert, and oriented x 3. No acute distress. Denies further chest pain. Heart and lung sounds are within normal limits. Abdomen is soft, non-tender, non-distended. Extremities are negative for cyanosis, clubbing, or edema. He is moving all extremities.    Cardiology has been consulted. The patient has been evaluated by Dr. Johnsie Cancel. The patient is not deemed a candidate for LHC. He recommends continuation of statin, betablocker, and ASA if possible with  risk stratification as outpatient with myoview. He also encourages glucose control and follow up with Dr. Donzetta Matters regarding his amputation.   GI has been consulted. FOBT has been ordered. Hgb will be followed. Nephrology has been consulted as the patient is ESRD on HD MWF. However he missed HD on Friday. He had HD off of schedule today.  GI plans for EGD soon. Plavix has been held.   Consultants  Gastroenterology Cardiology Nephrology  Procedures  HD  Antibiotics   Anti-infectives (From admission, onward)    None      Subjective  The patient is resting comfortably. No new complaints.  Objective   Vitals:  Vitals:   08/03/21 1515 08/03/21 1544  BP:  (!) 139/59  Pulse: 81 76  Resp: 17 17  Temp: 98.5 F (36.9 C) 98.1 F (36.7 C)  SpO2: 96%     Exam:  Constitutional:  The patient is awake, alert, and oriented x 3. No acute distress. Respiratory:  No increased work of breathing. No wheezes, rales, or rhonchi No tactile fremitus Cardiovascular:  Regular rate and rhythm No murmurs, ectopy, or gallups. No lateral PMI. No thrills. Abdomen:  Abdomen is soft, non-tender, non-distended No hernias, masses, or organomegaly Normoactive bowel sounds.  Musculoskeletal:  No cyanosis, clubbing, or edema Skin:  No rashes, lesions, ulcers palpation of skin: no induration or nodules Neurologic:  CN 2-12 intact Sensation all 4 extremities intact Psychiatric:  Mental status Mood, affect appropriate  Orientation to person, place, time  judgment and insight appear intact   I have personally reviewed the following:   Today's Data  Vitals  Lab Data  BMP, CBC, Anemia panel  Micro Data  Negative for MRSA  Imaging  CXR  Cardiology Data  EKG  Scheduled Meds:  sodium chloride   Intravenous Once   sodium chloride   Intravenous Once   aspirin EC  81 mg Oral Daily   atorvastatin  80 mg Oral q1800   calcitRIOL  1.25 mcg Oral Q M,W,F-HD   calcium carbonate  500 mg  Oral QAC supper   Chlorhexidine Gluconate Cloth  6 each Topical Q0600   darbepoetin (ARANESP) injection - DIALYSIS  60 mcg Intravenous Q Sat-HD   insulin aspart  0-6 Units Subcutaneous Q4H   insulin glargine-yfgn  10 Units Subcutaneous QHS   lanthanum  2,000 mg Oral TID WC   midodrine  10 mg Oral TID WC   pantoprazole (PROTONIX) IV  40 mg Intravenous Q12H   sodium chloride flush  3 mL Intravenous Q12H   Continuous Infusions:  sodium chloride     sodium chloride      Principal Problem:   Symptomatic anemia Active Problems:   Elevated troponin   CAD (coronary artery disease)   ESRD (end stage renal disease) on dialysis (HCC)   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 Symptomatic anemia  - Presents with fatigue, DOE, and chest discomfort and is found to have Hgb 6.8, down from 12.3 in Nov 2021  - He reports having a dark stool on 12/13 and subsequent normal stools, denies abd pain or hematemesis  - He refused DRE in ED  - Transfuse RBC, check post-transfusion H&H, continue daily PPI   -EGD has demonstrated bleeding Dieulafoy lesion cauterized -Continue to hold Plavix  2. GI Bleed:  EGD demonstrated a bleeding Dieulafoy lesion cauterized. Continue to follow Hgb.   2. Chest pain; CAD  - Pt with CAD s/p CABG in 2019 presents with chest pain that resolved pta  - Troponin was 45 then 65 in ED; EKG with repolarization abnormality  - He had severe stenosis of branch vessels that could not be bypassed in 2019 and may have some demand ischemia in setting of severe anemia  - Transfuse RBC, cautiously continue ASA , continue Lipitor, Coreg, and Imdur  - Plavix held   3. Uncontrolled IDDM  - Serum glucose 562 in ED without DKA; A1c was 8.2% a yr ago  - He was given insulin in ED  - Continue CBG checks and insulin     4. ESRD  - Reports completing HD on 07/29/21  - No indication for urgent HD on  admission  - Renally-dose medications, monitor   - Nephrology consulted.  I have seen and examined this patient myself. I have spent 34 minutes in his evaluation and care.   DVT prophylaxis: Avoid pharmacologic ppx for now in setting of possible GIB; SCD precluded by b/l BKA  Code Status: Full  Level of Care: Level of care: Telemetry Cardiac Family Communication: None present  Disposition Plan:  Patient is from: home  Anticipated d/c is to: Home  Anticipated d/c date is: 08/01/21 Patient currently: pending FOBT, post-transfusion H&H, repeat troponin  Coran Dipaola, DO [facility] Direct contact: see [email or web address]  7PM-7AM contact night coverage as  above 08/02/2021, 5:18 PM  LOS: 1 day

## 2021-08-03 NOTE — Progress Notes (Signed)
Phlebotomists were not able to get sufficient blood for lab work this morning which included the [place] as well. The NT tried to check the patient's blood sugar at 4 am and the patient refused saying he is tired of constant pricking.Phlebotomists returned to draw blood and the patient refused it again. Lab will come again later to try get the blood.

## 2021-08-03 NOTE — Progress Notes (Signed)
Patient would not allow lab to draw blood earlier this am when they usually come around. In dialysis, labs were obtained. Hemoglobin 6.4 resulted. No critical lab value called was received by dialysis or nurse. Physician was paged with result.

## 2021-08-03 NOTE — Progress Notes (Signed)
Patient ID: Orlander Norwood Sr., male   DOB: Sep 10, 1975, 45 y.o.   MRN: [id number] S: Feeling better and wants to go home.  IV team unable to get labs today. O:BP 105/65 (BP Location: Right Wrist)    Pulse 70    Temp 97.8 F (36.6 C) (Oral)    Resp 13    Ht 6\' 3"  (1.905 m)    Wt 85 kg    SpO2 91%    BMI 23.42 kg/m   Intake/Output Summary (Last 24 hours) at 08/03/2021 0816 Last data filed at 08/03/2021 0328 Gross per 24 hour  Intake 1698 ml  Output 20 ml  Net 1678 ml   Intake/Output: I/O last 3 completed shifts: In: 1698 [P.O.:480; I.V.:403; Blood:315; IV Piggyback:500] Out: 20 [Blood:20]  Intake/Output this shift:  No intake/output data recorded. Weight change: -0.7 kg Gen: NAD CVS: RRR Resp:CTA Abd: +BS, soft, NT/Nd Ext: no edema, LUE AVF +T/B  Recent Labs  Lab 07/31/21 0041 08/01/21 0446 08/02/21 0905  NA 128* 132*  --   K 3.3* 3.9  --   CL 90* 94*  --   CO2 25 21*  --   GLUCOSE 562* 97  --   BUN 94* 140*  --   CREATININE 9.65* 11.85*  --   ALBUMIN 2.9*  --   --   CALCIUM 8.5* 8.3*  --   PHOS  --   --  3.4  AST 18  --   --   ALT 17  --   --    Liver Function Tests: Recent Labs  Lab 07/31/21 0041  AST 18  ALT 17  ALKPHOS 38  BILITOT 0.6  PROT 6.3*  ALBUMIN 2.9*   No results for input(s): LIPASE, AMYLASE in the last 168 hours. No results for input(s): AMMONIA in the last 168 hours. CBC: Recent Labs  Lab 07/31/21 0041 07/31/21 2149 08/01/21 0446 08/02/21 0905  WBC 6.6 9.0 10.2 10.1  NEUTROABS 3.9  --   --  6.7  HGB 6.8* 6.7* 7.2* 6.3*  HCT 21.2* 19.6* 21.0* 18.4*  MCV 84.5 82.4 83.0 85.2  PLT 323 320 298 309   Cardiac Enzymes: No results for input(s): CKTOTAL, CKMB, CKMBINDEX, TROPONINI in the last 168 hours. CBG: Recent Labs  Lab 08/02/21 1303 08/02/21 1629 08/02/21 2013 08/02/21 2319 08/03/21 0733  GLUCAP 76 141* 100* 107* 76    Iron Studies:  Recent Labs    07/31/21 1353  IRON 126  TIBC 225*  FERRITIN 66    Studies/Results: No results found.  sodium chloride   Intravenous Once   aspirin EC  81 mg Oral Daily   atorvastatin  80 mg Oral q1800   calcitRIOL  1.25 mcg Oral Q M,W,F-HD   calcium carbonate  500 mg Oral QAC supper   Chlorhexidine Gluconate Cloth  6 each Topical Q0600   darbepoetin (ARANESP) injection - DIALYSIS  60 mcg Intravenous Q Sat-HD   insulin aspart  0-6 Units Subcutaneous Q4H   insulin glargine-yfgn  10 Units Subcutaneous QHS   lanthanum  2,000 mg Oral TID WC   midodrine  10 mg Oral TID WC   pantoprazole (PROTONIX) IV  40 mg Intravenous Q12H   sodium chloride flush  3 mL Intravenous Q12H    BMET    Component Value Date/Time   NA 132 (L) 08/01/2021 0446   NA 133 (L) 02/07/2018 0937   K 3.9 08/01/2021 0446   CL 94 (L) 08/01/2021 0446   CO2 21 (  L) 08/01/2021 0446   GLUCOSE 97 08/01/2021 0446   GLUCOSE 262 03/06/2008 0000   BUN 140 (H) 08/01/2021 0446   BUN 37 (H) 02/07/2018 0937   CREATININE 11.85 (H) 08/01/2021 0446   CALCIUM 8.3 (L) 08/01/2021 0446   GFRNONAA 5 (L) 08/01/2021 0446   GFRAA 5 (L) 03/10/2020 0734   CBC    Component Value Date/Time   WBC 10.1 08/02/2021 0905   RBC 2.16 (L) 08/02/2021 0905   HGB 6.3 (LL) 08/02/2021 0905   HGB 14.3 02/07/2018 0937   HCT 18.4 (L) 08/02/2021 0905   HCT 42.8 02/07/2018 0937   PLT 309 08/02/2021 0905   PLT 369 02/07/2018 0937   MCV 85.2 08/02/2021 0905   MCV 82 02/07/2018 0937   MCH 29.2 08/02/2021 0905   MCHC 34.2 08/02/2021 0905   RDW 17.7 (H) 08/02/2021 0905   RDW 21.5 (H) 02/07/2018 0937   LYMPHSABS 2.3 08/02/2021 0905   MONOABS 0.9 08/02/2021 0905   EOSABS 0.2 08/02/2021 0905   BASOSABS 0.0 08/02/2021 0905    OP HD: MWF [facility]   4h  75.9kg  500/1.5A  2K/3.5Ca bath  Hep 2500  LUA AVF  - calcitriol 0.75 tiw     CXR - no active disease   Assessment/ Plan: Symptomatic anemia/GI bleeding - SP 2 units prbcs 12/17. Seen by GI, s/p EGD 08/02/21 and found Dieulafoy lesion in the second portion of the  duodenum s/p fulguration with argon laser and hemostasis with 2 clips.  hgb dropped to 6.3 on 08/02/21.  S/p blood transfusion yesterday.  Will check labs with HD today.   Anemia ckd - ordered darbe 60ug weekly on Sat, given 12/17. Tsat high at 56%, no IV iron needed.  ESRD -   HD MWF. Missed Friday, had HD here on Sat. Plan for HD today to get back on outpatient schedule.  No heparin with HD.  Hypertension/volume - BP's soft, usually high. Up 7kg by wts, standing wt confirmed 7kg over. CXR clear. No vol^ by exam.  Will give NS 500cc x 1 for BP 80's. Adding midodrine 10 tid. Will dc coreg and imdur for now.  Metabolic bone disease -  Ca w/in range, continue Fosrenol binder , add on phos Nutrition - Renal diet with fluid restriction DM - per pmd CAD s/p CABG  Disposition - hopeful discharge to home today if Hgb remains stable.   Donetta Potts, MD [employer] [phone]

## 2021-08-04 DIAGNOSIS — K3182 Dieulafoy lesion (hemorrhagic) of stomach and duodenum: Principal | ICD-10-CM

## 2021-08-04 LAB — BPAM RBC
Blood Product Expiration Date: 202212282359
Blood Product Expiration Date: 202212312359
Blood Product Expiration Date: 202301022359
Blood Product Expiration Date: 202301102359
ISSUE DATE / TIME: 202212160347
ISSUE DATE / TIME: 202212170026
ISSUE DATE / TIME: 202212181004
ISSUE DATE / TIME: 202212191513
Unit Type and Rh: 7300
Unit Type and Rh: 7300
Unit Type and Rh: 7300
Unit Type and Rh: 7300

## 2021-08-04 LAB — TYPE AND SCREEN
ABO/RH(D): B POS
Antibody Screen: NEGATIVE
Unit division: 0
Unit division: 0
Unit division: 0
Unit division: 0

## 2021-08-04 LAB — CBC
HCT: 24.8 % — ABNORMAL LOW (ref 39.0–52.0)
Hemoglobin: 8.4 g/dL — ABNORMAL LOW (ref 13.0–17.0)
MCH: 29 pg (ref 26.0–34.0)
MCHC: 33.9 g/dL (ref 30.0–36.0)
MCV: 85.5 fL (ref 80.0–100.0)
Platelets: 324 10*3/uL (ref 150–400)
RBC: 2.9 MIL/uL — ABNORMAL LOW (ref 4.22–5.81)
RDW: 17 % — ABNORMAL HIGH (ref 11.5–15.5)
WBC: 8.1 10*3/uL (ref 4.0–10.5)
nRBC: 0 % (ref 0.0–0.2)

## 2021-08-04 LAB — HEPATITIS B SURFACE ANTIBODY, QUANTITATIVE: Hep B S AB Quant (Post): 83.6 m[IU]/mL (ref >9.9)

## 2021-08-04 LAB — GLUCOSE, CAPILLARY: Glucose-Capillary: 137 mg/dL — ABNORMAL HIGH (ref 70–99)

## 2021-08-04 MED ORDER — PANTOPRAZOLE SODIUM 40 MG PO TBEC: 40.0000 mg | DELAYED_RELEASE_TABLET | Freq: Two times a day (BID) | ORAL | 0 refills | Status: DC

## 2021-08-04 MED ORDER — INSULIN ASPART 100 UNIT/ML IJ SOLN
0.0000 [IU] | Freq: Three times a day (TID) | INTRAMUSCULAR | Status: DC
Start: 2021-08-04 — End: 2021-08-04

## 2021-08-04 MED ORDER — CLOPIDOGREL BISULFATE 75 MG PO TABS: 75.0000 mg | ORAL_TABLET | Freq: Every day | ORAL | 6 refills | Status: DC

## 2021-08-04 MED ORDER — PANTOPRAZOLE SODIUM 40 MG PO TBEC
40.0000 mg | DELAYED_RELEASE_TABLET | Freq: Two times a day (BID) | ORAL | Status: DC
  Administered 2021-08-04: 10:00:00 40 mg via ORAL
  Filled 2021-08-04: qty 1

## 2021-08-04 MED ORDER — INSULIN GLARGINE 100 UNIT/ML SOLOSTAR PEN: 5.0000 [IU] | PEN_INJECTOR | Freq: Every day | SUBCUTANEOUS | 11 refills | Status: DC

## 2021-08-04 MED ORDER — INSULIN ASPART 100 UNIT/ML FLEXPEN: 1.0000 [IU] | PEN_INJECTOR | Freq: Three times a day (TID) | SUBCUTANEOUS | 11 refills | Status: DC

## 2021-08-04 MED ORDER — PANTOPRAZOLE SODIUM 40 MG PO TBEC: 40.0000 mg | DELAYED_RELEASE_TABLET | Freq: Every day | ORAL | 3 refills | Status: DC

## 2021-08-04 MED ORDER — MIDODRINE HCL 10 MG PO TABS
10.0000 mg | ORAL_TABLET | Freq: Three times a day (TID) | ORAL | 3 refills | Status: DC
Start: 2021-08-04 — End: 2021-10-27

## 2021-08-04 NOTE — Discharge Summary (Signed)
Physician Discharge Summary   [name] Mccauley Harper Sr. MGQ:[id number] DOB: 30-Jun-1976 DOA: 07/31/2021  PCP: Sandi Mariscal, MD  Admit date: 07/31/2021 Discharge date: 08/04/2021   Admitted From: home Disposition:  home Discharging physician: Dwyane Dee, MD  Recommendations for Outpatient Follow-up:  Continue chronic HD  Home Health:  Equipment/Devices:   Discharge Condition: stable CODE STATUS: Full Diet recommendation:  Diet Orders (From admission, onward)     Start     Ordered   08/03/21 1158  Diet renal with fluid restriction Fluid restriction: 1200 mL Fluid; Room service appropriate? Yes; Fluid consistency: Thin  Diet effective now       Question Answer Comment  Fluid restriction: 1200 mL Fluid   Room service appropriate? Yes   Fluid consistency: Thin      08/03/21 [facility] Course:  Symptomatic anemia  GIB - Presents with fatigue, DOE, and chest discomfort and is found to have Hgb 6.8, down from 12.3 in Nov 2021 - s/p PRBC and Hgb stabilized - EGD showed bleeding Dieulafoy lesion that was cauterized -Plavix to resume on 08/05/2021  CAD  - Pt with CAD s/p CABG in 2019 presents with chest pain that resolved pta  - Troponin was 45 then 65 in ED; EKG with repolarization abnormality  - He had severe stenosis of branch vessels that could not be bypassed in 2019 and may have some demand ischemia in setting of severe anemia    Uncontrolled IDDM  - Serum glucose 562 in ED without DKA; A1c was 8.2% a yr ago -Some hypoglycemia after admission - Repeat A1c 9.1% this hospitalization - Lantus regimen adjusted prior to discharge   ESRD  -Dialyzed while in the hospital. - Resume MWF schedule at discharge   The patient's chronic medical conditions were treated accordingly per the patient's home medication regimen except as noted.  On day of discharge, patient was felt deemed stable for discharge. Patient/family member advised to call PCP or come back  to ER if needed.   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   aspirin EC 81 MG tablet Take 1 tablet (81 mg total) by mouth daily.   atorvastatin 80 MG tablet Commonly known as: LIPITOR Take 1 tablet (80 mg total) by mouth daily at 6 PM. What changed: when to take this   calcitRIOL 0.25 MCG capsule Commonly known as: ROCALTROL Take 5 capsules (1.25 mcg total) by mouth every Monday, Wednesday, and Friday with  hemodialysis.   calcium acetate 667 MG capsule Commonly known as: PHOSLO Take 2,001 mg by mouth 3 (three) times daily with meals.   calcium carbonate 500 MG chewable tablet Commonly known as: TUMS - dosed in mg elemental calcium Chew 500 mg by mouth daily.   carvedilol 3.125 MG tablet Commonly known as: COREG Take 3.125 mg by mouth 2 (two) times daily.   clopidogrel 75 MG tablet Commonly known as: Plavix Take 1 tablet (75 mg total) by mouth daily. Start taking on: August 05, 2021   Darbepoetin Alfa 200 MCG/0.4ML Sosy injection Commonly known as: ARANESP Inject 0.4 mLs (200 mcg total) into the vein every Monday with hemodialysis.   insulin  aspart 100 UNIT/ML FlexPen Commonly known as: NOVOLOG Inject 1-6 Units into the skin 3 (three) times daily with meals. CBG 151 - 200: 1 unit, CBG 201-250: 2 units, CBG 251-300: 3 units, CBG 301-350: 4 units, CBG 351-400: 5 units, CBG > 400: Give 6 units What changed:  how much to take additional instructions   insulin glargine 100 UNIT/ML Solostar Pen Commonly known as: LANTUS Inject 5 Units into the skin daily. What changed:  how much to take when to take this   isosorbide mononitrate 60 MG 24 hr tablet Commonly known as: IMDUR Take 60 mg by mouth at bedtime. What changed: Another medication with the same name was removed. Continue taking this medication, and follow the directions you see here.   lanthanum 1000 MG chewable tablet Commonly known as: FOSRENOL Chew 2 tablets (2,000 mg total) by mouth 3 (three) times daily with meals.   methocarbamol 500 MG tablet Commonly known as: ROBAXIN Take 1,000 mg by mouth 4 (four) times daily as needed for muscle spasms.   midodrine 10 MG tablet Commonly known as: PROAMATINE Take 1 tablet (10 mg total) by mouth 3 (three) times daily with meals.   multivitamin Tabs tablet Take 1 tablet by mouth at bedtime. What changed: when to take this   nitroGLYCERIN 0.4 MG SL tablet Commonly known as: NITROSTAT Place 1 tablet (0.4 mg total) under the tongue every 5 (five) minutes as needed for chest pain.   pantoprazole 40 MG tablet Commonly known as: PROTONIX Take 1 tablet (40 mg total) by mouth 2 (two) times daily.   pantoprazole 40 MG tablet Commonly known as: Protonix Take 1 tablet (40 mg total) by mouth daily. Start taking on: September 04, 2021        Allergies  Allergen Reactions   Coconut Oil Anaphylaxis and Other (See Comments)    Can use topically, allergic to coconut foods     Consultations: GI Nephrology  Discharge Exam: BP (!) 101/53 (BP Location: Right Wrist)    Pulse 75    Temp 97.9 F (36.6 C) (Oral)    Resp 17     Ht 6\' 3"  (1.905 m)    Wt 81.8 kg    SpO2 95%    BMI 22.54 kg/m  Physical Exam Constitutional:      General: He is not in acute distress.    Appearance: Normal appearance.  HENT:     Head: Normocephalic and atraumatic.     Mouth/Throat:     Mouth: Mucous membranes are moist.  Eyes:     Extraocular Movements: Extraocular movements intact.  Cardiovascular:     Rate and Rhythm: Normal rate and regular rhythm.     Heart sounds: Normal heart sounds.  Pulmonary:     Effort: Pulmonary effort is normal. No respiratory  distress.     Breath sounds: Normal breath sounds. No wheezing.  Abdominal:     General: Bowel sounds are normal. There is no distension.     Palpations: Abdomen is soft.     Tenderness: There is no abdominal tenderness.  Musculoskeletal:        General: Normal range of motion.     Cervical back: Normal range of motion and neck supple.     Comments: B/L BKA noted  Skin:    General: Skin is warm and dry.  Neurological:     General: No focal deficit present.     Mental Status: He is alert.  Psychiatric:        Mood and Affect: Mood normal.        Behavior: Behavior normal.     The results of significant diagnostics from this hospitalization (including imaging, microbiology, ancillary and laboratory) are listed below for reference.   Microbiology: Recent Results (from the past 240 hour(s))  Resp Panel by RT-PCR (Flu A&B, Covid) Nasopharyngeal Swab     Status: None   Collection Time: 07/31/21 12:41 AM   Specimen: Nasopharyngeal Swab; Nasopharyngeal(NP) swabs in vial transport medium  Result Value Ref Range Status   SARS Coronavirus 2 by RT PCR NEGATIVE NEGATIVE Final    Comment: (NOTE) SARS-CoV-2 target nucleic acids are NOT DETECTED.  The SARS-CoV-2 RNA is generally detectable in upper respiratory specimens during the acute phase of infection. The lowest concentration of SARS-CoV-2 viral copies this assay can detect is 138 copies/mL. A negative result does not  preclude SARS-Cov-2 infection and should not be used as the sole basis for treatment or other patient management decisions. A negative result may occur with  improper specimen collection/handling, submission of specimen other than nasopharyngeal swab, presence of viral mutation(s) within the areas targeted by this assay, and inadequate number of viral copies(<138 copies/mL). A negative result must be combined with clinical observations, patient history, and epidemiological information. The expected result is Negative.  Fact Sheet for Patients:  EntrepreneurPulse.com.au  Fact Sheet for Healthcare Providers:  IncredibleEmployment.be  This test is no t yet approved or cleared by the [place] FDA and  has been authorized for detection and/or diagnosis of SARS-CoV-2 by FDA under an Emergency Use Authorization (EUA). This EUA will remain  in effect (meaning this test can be used) for the duration of the COVID-19 declaration under Section 564(b)(1) of the Act, 21 U.S.C.section 360bbb-3(b)(1), unless the authorization is terminated  or revoked sooner.       Influenza A by PCR NEGATIVE NEGATIVE Final   Influenza B by PCR NEGATIVE NEGATIVE Final    Comment: (NOTE) The Xpert Xpress SARS-CoV-2/FLU/RSV plus assay is intended as an aid in the diagnosis of influenza from Nasopharyngeal swab specimens and should not be used as a sole basis for treatment. Nasal washings and aspirates are unacceptable for Xpert Xpress SARS-CoV-2/FLU/RSV testing.  Fact Sheet for Patients: EntrepreneurPulse.com.au  Fact Sheet for Healthcare Providers: IncredibleEmployment.be  This test is not yet approved or cleared by the [place] FDA and has been authorized for detection and/or diagnosis of SARS-CoV-2 by FDA under an Emergency Use Authorization (EUA). This EUA will remain in effect (meaning this test can be used) for the  duration of the COVID-19 declaration under Section 564(b)(1) of the Act, 21 U.S.C. section 360bbb-3(b)(1), unless the authorization is terminated or revoked.  Performed at [facility], [place] [address] [place], [place] [address]   MRSA Next Gen by PCR,  Nasal     Status: None   Collection Time: 07/31/21  4:18 PM   Specimen: Nasal Mucosa; Nasal Swab  Result Value Ref Range Status   MRSA by PCR Next Gen NOT DETECTED NOT DETECTED Final    Comment: (NOTE) The GeneXpert MRSA Assay (FDA approved for NASAL specimens only), is one component of a comprehensive MRSA colonization surveillance program. It is not intended to diagnose MRSA infection nor to guide or monitor treatment for MRSA infections. Test performance is not FDA approved in patients less than 14 years old. Performed at [facility], [place] [address] [place], [place] [address]      Labs: BNP (last 3 results) Recent Labs    07/31/21 0041  BNP 604.5*   Basic Metabolic Panel: Recent Labs  Lab 07/31/21 0041 08/01/21 0446 08/02/21 0905 08/03/21 1013 08/03/21 2047  NA 128* 132*  --  130* 132*  K 3.3* 3.9  --  3.8 3.9  CL 90* 94*  --  92* 96*  CO2 25 21*  --  22 28  GLUCOSE 562* 97  --  76 179*  BUN 94* 140*  --  89* 35*  CREATININE 9.65* 11.85*  --  10.18* 5.79*  CALCIUM 8.5* 8.3*  --  7.9* 7.8*  PHOS  --   --  3.4 5.0* 3.4   Liver Function Tests: Recent Labs  Lab 07/31/21 0041 08/03/21 1013 08/03/21 2047  AST 18  --   --   ALT 17  --   --   ALKPHOS 38  --   --   BILITOT 0.6  --   --   PROT 6.3*  --   --   ALBUMIN 2.9* 3.1* 3.0*   No results for input(s): LIPASE, AMYLASE in the last 168 hours. No results for input(s): AMMONIA in the last 168 hours. CBC: Recent Labs  Lab 07/31/21 0041 07/31/21 2149 08/01/21 0446 08/02/21 0905 08/03/21 1013 08/03/21 2047 08/04/21 0909  WBC 6.6 9.0 10.2 10.1 9.0  --  8.1  NEUTROABS 3.9  --   --  6.7  --   --   --   HGB 6.8* 6.7* 7.2* 6.3* 6.4* 8.8* 8.4*   HCT 21.2* 19.6* 21.0* 18.4* 19.5* 26.0* 24.8*  MCV 84.5 82.4 83.0 85.2 86.3  --  85.5  PLT 323 320 298 309 295  --  324   Cardiac Enzymes: No results for input(s): CKTOTAL, CKMB, CKMBINDEX, TROPONINI in the last 168 hours. BNP: Invalid input(s): POCBNP CBG: Recent Labs  Lab 08/03/21 1327 08/03/21 1756 08/03/21 1917 08/03/21 2319 08/04/21 0331  GLUCAP 78 189* 224* 96 137*   D-Dimer No results for input(s): DDIMER in the last 72 hours. Hgb A1c No results for input(s): HGBA1C in the last 72 hours. Lipid Profile No results for input(s): CHOL, HDL, LDLCALC, TRIG, CHOLHDL, LDLDIRECT in the last 72 hours. Thyroid function studies No results for input(s): TSH, T4TOTAL, T3FREE, THYROIDAB in the last 72 hours.  Invalid input(s): FREET3 Anemia work up No results for input(s): VITAMINB12, FOLATE, FERRITIN, TIBC, IRON, RETICCTPCT in the last 72 hours. Urinalysis    Component Value Date/Time   COLORURINE RED BIOCHEMICALS MAY BE AFFECTED BY COLOR (A) 01/29/2010 0939   APPEARANCEUR CLOUDY (A) 01/29/2010 0939   LABSPEC 1.020 01/29/2010 0939   PHURINE 8.0 01/29/2010 0939   GLUCOSEU NEGATIVE 01/29/2010 0939   HGBUR LARGE (A) 01/29/2010 0939   BILIRUBINUR MODERATE (A) 01/29/2010 0939   KETONESUR 15 (A) 01/29/2010 0939   PROTEINUR >  300 (A) 01/29/2010 0939   UROBILINOGEN 0.2 01/29/2010 0939   NITRITE POSITIVE (A) 01/29/2010 0939   LEUKOCYTESUR LARGE (A) 01/29/2010 0939   Sepsis Labs Invalid input(s): PROCALCITONIN,  WBC,  LACTICIDVEN Microbiology Recent Results (from the past 240 hour(s))  Resp Panel by RT-PCR (Flu A&B, Covid) Nasopharyngeal Swab     Status: None   Collection Time: 07/31/21 12:41 AM   Specimen: Nasopharyngeal Swab; Nasopharyngeal(NP) swabs in vial transport medium  Result Value Ref Range Status   SARS Coronavirus 2 by RT PCR NEGATIVE NEGATIVE Final    Comment: (NOTE) SARS-CoV-2 target nucleic acids are NOT DETECTED.  The SARS-CoV-2 RNA is generally detectable in  upper respiratory specimens during the acute phase of infection. The lowest concentration of SARS-CoV-2 viral copies this assay can detect is 138 copies/mL. A negative result does not preclude SARS-Cov-2 infection and should not be used as the sole basis for treatment or other patient management decisions. A negative result may occur with  improper specimen collection/handling, submission of specimen other than nasopharyngeal swab, presence of viral mutation(s) within the areas targeted by this assay, and inadequate number of viral copies(<138 copies/mL). A negative result must be combined with clinical observations, patient history, and epidemiological information. The expected result is Negative.  Fact Sheet for Patients:  EntrepreneurPulse.com.au  Fact Sheet for Healthcare Providers:  IncredibleEmployment.be  This test is no t yet approved or cleared by the [place] FDA and  has been authorized for detection and/or diagnosis of SARS-CoV-2 by FDA under an Emergency Use Authorization (EUA). This EUA will remain  in effect (meaning this test can be used) for the duration of the COVID-19 declaration under Section 564(b)(1) of the Act, 21 U.S.C.section 360bbb-3(b)(1), unless the authorization is terminated  or revoked sooner.       Influenza A by PCR NEGATIVE NEGATIVE Final   Influenza B by PCR NEGATIVE NEGATIVE Final    Comment: (NOTE) The Xpert Xpress SARS-CoV-2/FLU/RSV plus assay is intended as an aid in the diagnosis of influenza from Nasopharyngeal swab specimens and should not be used as a sole basis for treatment. Nasal washings and aspirates are unacceptable for Xpert Xpress SARS-CoV-2/FLU/RSV testing.  Fact Sheet for Patients: EntrepreneurPulse.com.au  Fact Sheet for Healthcare Providers: IncredibleEmployment.be  This test is not yet approved or cleared by the [place] FDA and has been  authorized for detection and/or diagnosis of SARS-CoV-2 by FDA under an Emergency Use Authorization (EUA). This EUA will remain in effect (meaning this test can be used) for the duration of the COVID-19 declaration under Section 564(b)(1) of the Act, 21 U.S.C. section 360bbb-3(b)(1), unless the authorization is terminated or revoked.  Performed at [facility], [place] [address], [place], [place] [address]   MRSA Next Gen by PCR, Nasal     Status: None   Collection Time: 07/31/21  4:18 PM   Specimen: Nasal Mucosa; Nasal Swab  Result Value Ref Range Status   MRSA by PCR Next Gen NOT DETECTED NOT DETECTED Final    Comment: (NOTE) The GeneXpert MRSA Assay (FDA approved for NASAL specimens only), is one component of a comprehensive MRSA colonization surveillance program. It is not intended to diagnose MRSA infection nor to guide or monitor treatment for MRSA infections. Test performance is not FDA approved in patients less than 39 years old. Performed at [facility], [place] [address], [place], [place] [address]     Procedures/Studies: DG Chest Port 1 View  Result Date: 07/31/2021 CLINICAL DATA:  Chest pain EXAM: PORTABLE CHEST 1 VIEW COMPARISON:  06/23/2020 FINDINGS: Mild bibasilar scarring/atelectasis. No focal consolidation. No pleural effusion or pneumothorax. Heart is normal in size. Thoracic aortic atherosclerosis. Postsurgical changes related to prior CABG. Median sternotomy. IMPRESSION: No evidence of acute cardiopulmonary disease. Electronically Signed   By: Julian Hy M.D.   On: 07/31/2021 01:09     Time coordinating discharge: Over 30 minutes    Dwyane Dee, MD  [facility] 08/04/2021, 4:23 PM

## 2021-08-04 NOTE — Progress Notes (Signed)
Discharge instructions (including medications) discussed with and copy provided to patient/caregiver 

## 2021-08-04 NOTE — Plan of Care (Signed)
  Problem: Education: Goal: Knowledge of General Education information will improve Description: Including pain rating scale, medication(s)/side effects and non-pharmacologic comfort measures Outcome: Adequate for Discharge   

## 2021-08-04 NOTE — Progress Notes (Addendum)
Daily Rounding Note  08/04/2021, 8:30 AM  LOS: 3 days   SUBJECTIVE:   Chief complaint:   Anemia.  FOBT positive.  No BM since prior to EGD when stool was black, no FOBT testing thus far.   No dizziness w standing.  Eating solids, no  N/V.    OBJECTIVE:         Vital signs in last 24 hours:    Temp:  [97.5 F (36.4 C)-98.5 F (36.9 C)] 97.9 F (36.6 C) (12/20 0332) Pulse Rate:  [65-81] 75 (12/20 0332) Resp:  [10-18] 17 (12/20 0332) BP: (101-142)/(25-110) 101/53 (12/20 0332) SpO2:  [95 %-96 %] 95 % (12/20 0332) Weight:  [81.8 kg-83 kg] 81.8 kg (12/20 0557) Last BM Date: 08/01/21 Filed Weights   08/03/21 0857 08/03/21 1231 08/04/21 0557  Weight: 83 kg 82.4 kg 81.8 kg   General:   NAD. Heart: RRR. Chest: No labored breathing, no cough.  Lungs clear bilaterally. Abdomen: Bowel sounds active.  Not tender or distended. Extremities: Bilateral BKA Neuro/Psych: Alert.  Appropriate.  Fully oriented.  Intake/Output from previous day: 12/19 0701 - 12/20 0700 In: 798 [P.O.:480; I.V.:3; Blood:315] Out: 1260   Intake/Output this shift: No intake/output data recorded.  Lab Results: Recent Labs    08/02/21 0905 08/03/21 1013 08/03/21 2047  WBC 10.1 9.0  --   HGB 6.3* 6.4* 8.8*  HCT 18.4* 19.5* 26.0*  PLT 309 295  --    BMET Recent Labs    08/03/21 1013 08/03/21 2047  NA 130* 132*  K 3.8 3.9  CL 92* 96*  CO2 22 28  GLUCOSE 76 179*  BUN 89* 35*  CREATININE 10.18* 5.79*  CALCIUM 7.9* 7.8*   LFT Recent Labs    08/03/21 1013 08/03/21 2047  ALBUMIN 3.1* 3.0*   PT/INR No results for input(s): LABPROT, INR in the last 72 hours. Hepatitis Panel Recent Labs    08/01/21 1142  HEPBSAG NON REACTIVE    Studies/Results: No results found.  Scheduled Meds:  sodium chloride   Intravenous Once   sodium chloride   Intravenous Once   aspirin EC  81 mg Oral Daily   atorvastatin  80 mg Oral q1800    calcitRIOL  1.25 mcg Oral Q M,W,F-HD   calcium carbonate  500 mg Oral QAC supper   Chlorhexidine Gluconate Cloth  6 each Topical Q0600   darbepoetin (ARANESP) injection - DIALYSIS  60 mcg Intravenous Q Sat-HD   insulin aspart  0-6 Units Subcutaneous TID WC   lanthanum  2,000 mg Oral TID WC   midodrine  10 mg Oral TID WC   pantoprazole (PROTONIX) IV  40 mg Intravenous Q12H   sodium chloride flush  3 mL Intravenous Q12H   Continuous Infusions:  sodium chloride     sodium chloride     PRN Meds:.acetaminophen **OR** acetaminophen, methocarbamol, nitroGLYCERIN, ondansetron (ZOFRAN) IV, oxyCODONE   ASSESMENT:    Blood loss anemia. Hgb 6.4 >> 8.8.  4 PRBCs thus far, latest 12/19.  Iron, folate, B12, iron sats at or above normal.  Low TIBC.  Had not been on Aranesp but now getting this every Saturday        12/18 EGD w bleeding Dieulafoy lesion at duodenal bulb treated with APC and endoclips.  Chronic Plavix.  PVD, CAD.  S/p bil BKA.  Plavix on hold, last dose 12/15  ESRD.  HD MWF  PLAN    Restart Plavix tmrw.  GI fup prn.  Protonix.  Changed to 40 mg po bid, x 1 month.  Then drop to once daily.    [name]  08/04/2021, 8:30 AM Phone [phone]

## 2021-08-04 NOTE — Progress Notes (Signed)
Patient ID: [name] Chestnutt Sr., male   DOB: 18-May-1976, 45 y.o.   MRN: [id number] S: No events overnight.  "I'm ready to go home". O:BP (!) 101/53 (BP Location: Right Wrist)    Pulse 75    Temp 97.9 F (36.6 C) (Oral)    Resp 17    Ht 6\' 3"  (1.905 m)    Wt 81.8 kg    SpO2 95%    BMI 22.54 kg/m   Intake/Output Summary (Last 24 hours) at 08/04/2021 0924 Last data filed at 08/04/2021 3532 Gross per 24 hour  Intake 1158 ml  Output 1260 ml  Net -102 ml   Intake/Output: I/O last 3 completed shifts: In: 1281 [P.O.:960; I.V.:6; Blood:315] Out: 1260 [Other:1260]  Intake/Output this shift:  Total I/O In: 360 [P.O.:360] Out: -  Weight change: -0.5 kg Gen:NAD CVS: RRR Resp:CTA Abd: +BS, soft, NT/ND Ext: no edema, LUE AVF +T/B  Recent Labs  Lab 07/31/21 0041 08/01/21 0446 08/02/21 0905 08/03/21 1013 08/03/21 2047  NA 128* 132*  --  130* 132*  K 3.3* 3.9  --  3.8 3.9  CL 90* 94*  --  92* 96*  CO2 25 21*  --  22 28  GLUCOSE 562* 97  --  76 179*  BUN 94* 140*  --  89* 35*  CREATININE 9.65* 11.85*  --  10.18* 5.79*  ALBUMIN 2.9*  --   --  3.1* 3.0*  CALCIUM 8.5* 8.3*  --  7.9* 7.8*  PHOS  --   --  3.4 5.0* 3.4  AST 18  --   --   --   --   ALT 17  --   --   --   --    Liver Function Tests: Recent Labs  Lab 07/31/21 0041 08/03/21 1013 08/03/21 2047  AST 18  --   --   ALT 17  --   --   ALKPHOS 38  --   --   BILITOT 0.6  --   --   PROT 6.3*  --   --   ALBUMIN 2.9* 3.1* 3.0*   No results for input(s): LIPASE, AMYLASE in the last 168 hours. No results for input(s): AMMONIA in the last 168 hours. CBC: Recent Labs  Lab 07/31/21 0041 07/31/21 2149 08/01/21 0446 08/02/21 0905 08/03/21 1013 08/03/21 2047  WBC 6.6 9.0 10.2 10.1 9.0  --   NEUTROABS 3.9  --   --  6.7  --   --   HGB 6.8* 6.7* 7.2* 6.3* 6.4* 8.8*  HCT 21.2* 19.6* 21.0* 18.4* 19.5* 26.0*  MCV 84.5 82.4 83.0 85.2 86.3  --   PLT 323 320 298 309 295  --    Cardiac Enzymes: No results for input(s):  CKTOTAL, CKMB, CKMBINDEX, TROPONINI in the last 168 hours. CBG: Recent Labs  Lab 08/03/21 1327 08/03/21 1756 08/03/21 1917 08/03/21 2319 08/04/21 0331  GLUCAP 78 189* 224* 96 137*    Iron Studies: No results for input(s): IRON, TIBC, TRANSFERRIN, FERRITIN in the last 72 hours. Studies/Results: No results found.  sodium chloride   Intravenous Once   sodium chloride   Intravenous Once   aspirin EC  81 mg Oral Daily   atorvastatin  80 mg Oral q1800   calcitRIOL  1.25 mcg Oral Q M,W,F-HD   calcium carbonate  500 mg Oral QAC supper   Chlorhexidine Gluconate Cloth  6 each Topical Q0600   darbepoetin (ARANESP) injection - DIALYSIS  60 mcg Intravenous Q Sat-HD  insulin aspart  0-6 Units Subcutaneous TID WC   lanthanum  2,000 mg Oral TID WC   midodrine  10 mg Oral TID WC   pantoprazole  40 mg Oral BID   sodium chloride flush  3 mL Intravenous Q12H    BMET    Component Value Date/Time   NA 132 (L) 08/03/2021 2047   NA 133 (L) 02/07/2018 0937   K 3.9 08/03/2021 2047   CL 96 (L) 08/03/2021 2047   CO2 28 08/03/2021 2047   GLUCOSE 179 (H) 08/03/2021 2047   GLUCOSE 262 03/06/2008 0000   BUN 35 (H) 08/03/2021 2047   BUN 37 (H) 02/07/2018 0937   CREATININE 5.79 (H) 08/03/2021 2047   CALCIUM 7.8 (L) 08/03/2021 2047   GFRNONAA 12 (L) 08/03/2021 2047   GFRAA 5 (L) 03/10/2020 0734   CBC    Component Value Date/Time   WBC 9.0 08/03/2021 1013   RBC 2.26 (L) 08/03/2021 1013   HGB 8.8 (L) 08/03/2021 2047   HGB 14.3 02/07/2018 0937   HCT 26.0 (L) 08/03/2021 2047   HCT 42.8 02/07/2018 0937   PLT 295 08/03/2021 1013   PLT 369 02/07/2018 0937   MCV 86.3 08/03/2021 1013   MCV 82 02/07/2018 0937   MCH 28.3 08/03/2021 1013   MCHC 32.8 08/03/2021 1013   RDW 17.3 (H) 08/03/2021 1013   RDW 21.5 (H) 02/07/2018 0937   LYMPHSABS 2.3 08/02/2021 0905   MONOABS 0.9 08/02/2021 0905   EOSABS 0.2 08/02/2021 0905   BASOSABS 0.0 08/02/2021 0905        OP HD: MWF [facility]   4h  75.9kg  500/1.5A   2K/3.5Ca bath  Hep 2500  LUA AVF  - calcitriol 0.75 tiw     CXR - no active disease   Assessment/ Plan: Symptomatic anemia/GI bleeding - SP 2 units prbcs 12/17. 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  hgb dropped to 6.3 on 08/02/21.  S/p blood transfusion yesterday.  2 more units and Hgb 8.8.  Will need outpatient GI follow up. Anemia ckd - ordered darbe 60ug weekly on Sat, given 12/17. Tsat high at 56%, no IV iron needed.  ESRD -   HD MWF. Missed Friday, had HD here on Sat. Tolerated HD yesterday so back on MWF schedule.  No heparin.   Hypertension/volume - BP's soft, usually high. Up 7kg by wts, standing wt confirmed 7kg over. CXR clear. No vol^ by exam.  will need new edw after discharge.  Metabolic bone disease -  Ca w/in range, continue Fosrenol binder , add on phos Nutrition - Renal diet with fluid restriction DM - per pmd CAD s/p CABG  Disposition - hopeful discharge to home today.  Donetta Potts, MD [employer] [phone]

## 2021-08-06 NOTE — TOC Transition Note (Signed)
Transition of care contact from inpatient facility  Date of discharge: 08/04/21 Date of contact: 08/06/21 Method: Phone Spoke to: Patient  Patient contacted to discuss transition of care from recent inpatient hospitalization. Patient was admitted to [facility] from 07/31/21-08/06/21 with discharge diagnosis of symptomatic anemia/GIB.   Medication changes were reviewed.  Patient received HD 08/05/21 at [facility]. Plan for renal team to see patient at his next scheduled HD.  Tobie Poet, NP

## 2021-09-16 DEATH — deceased

## 2021-09-22 ENCOUNTER — Ambulatory Visit: Payer: Medicaid Other | Admitting: Physician Assistant

## 2021-10-26 NOTE — Progress Notes (Unsigned)
Office Visit    Patient Name: [name] Shankman Sr. Date of Encounter: 10/26/2021  PCP:  Sandi Mariscal, [facility]  Cardiologist:  Jenkins Rouge, MD  Advanced Practice Provider:  No care team member to display Electrophysiologist:  None   Chief Complaint    [name] Lacher Sr. is a 46 y.o. male with a hx of ESRD on HD, hypertension, CHF, DM, vascular disease with left BKA, CABG 3/19 with Dr. Roxan Hockey with LIMA to LAD, SVG to OM1 and SVG to PDA EF by TTE 3/19 50-55%. Diabetes has been poorly controlled with A1c of 9.1, and anemia who presents today for annual follow-up.  He was last seen October 2019 and had recently underwent CABG in March 2019, required antibiotics for his sternum to heal.  Overall was doing well at that time.  Was encouraged to stop smoking.  Discussed a low-carb diet and a target hemoglobin A1c.  Today, he ***  Past Medical History    Past Medical History:  Diagnosis Date   Anemia    Atherosclerosis of lower extremity (HCC)    Blood clot in vein    right calf   CAD (coronary artery disease)    Cataracts, bilateral    Chronic combined systolic and diastolic heart failure (HCC)    Complication of anesthesia    Depression    ESRD (end stage renal disease) on dialysis ([place])    "MWF [place]" (03/08/2017)   GERD (gastroesophageal reflux disease)    Heart murmur    History of blood transfusion    "related to OR"   Hypertension    Myocardial infarction ([place])     " light"   Nonhealing surgical wound    nonviable tissue   PONV (postoperative nausea and vomiting)    S/P unilateral BKA (below knee amputation), left ([place])    Type II diabetes mellitus ([place])    Wears glasses    Past Surgical History:  Procedure Laterality Date   ABDOMINAL AORTOGRAM N/A 11/02/2016   Procedure: Abdominal Aortogram;  Surgeon: Waynetta Sandy, MD;  Location: [place] CV [facility];  Service: Cardiovascular;  Laterality: N/A;    ABDOMINAL AORTOGRAM W/LOWER EXTREMITY Right 08/07/2018   Procedure: ABDOMINAL AORTOGRAM W/LOWER EXTREMITY;  Surgeon: [name], MD;  Location: [place] CV [facility];  Service: Cardiovascular;  Laterality: Right;   AMPUTATION Left 09/27/2013   Procedure: LEFT GREAT TOE AMPUTATION;  Surgeon: [name], MD;  Location: [place];  Service: Orthopedics;  Laterality: Left;   AMPUTATION Right 08/15/2015   Procedure: Right Great Toe Amputation;  Surgeon: [name], MD;  Location: [place];  Service: Orthopedics;  Laterality: Right;   AMPUTATION Left 11/05/2016   Procedure: TRANSMETATARSAL AMPUTATION LEFT FOOT;  Surgeon: [name], MD;  Location: [place];  Service: Orthopedics;  Laterality: Left;   AMPUTATION Left 03/11/2017   Procedure: LEFT BELOW KNEE AMPUTATION;  Surgeon: [name], MD;  Location: [place];  Service: Orthopedics;  Laterality: Left;   AMPUTATION Right 03/11/2017   Procedure: RIGHT 2ND TOE AMPUTATION;  Surgeon: [name], MD;  Location: [place];  Service: Orthopedics;  Laterality: Right;   AMPUTATION Right 11/17/2018   Procedure: AMPUTATION BELOW KNEE;  Surgeon: [name], MD;  Location: [place];  Service: Vascular;  Laterality: Right;   AV FISTULA PLACEMENT  left arm   CORONARY ARTERY BYPASS GRAFT N/A 11/04/2017   Procedure: CORONARY ARTERY BYPASS GRAFTING (CABG) x three,  using left internal mammary artery and right    leg greater saphenous vein;  Surgeon: Melrose Nakayama, MD;  Location: [place];  Service: Open Heart Surgery;  Laterality: N/A;   ESOPHAGOGASTRODUODENOSCOPY (EGD) WITH PROPOFOL N/A 08/02/2021   Procedure: ESOPHAGOGASTRODUODENOSCOPY (EGD) WITH PROPOFOL;  Surgeon: Yetta Flock, MD;  Location: [place];  Service: Gastroenterology;  Laterality: N/A;   FASCIOTOMY Right 08/07/2018   Procedure: FOUR COMPARTMENT FASCIOTOMY OF RIGHT LOWER LEG;  Surgeon: Rosetta Posner, MD;  Location: [employer];  Service: Vascular;  Laterality: Right;   FASCIOTOMY  CLOSURE Right 08/10/2018   Procedure: FASCIOTOMY CLOSURE RIGHT LOWER EXTREMITY;  Surgeon: [name], MD;  Location: [place];  Service: Vascular;  Laterality: Right;   HEMATOMA EVACUATION Right 08/07/2018   Procedure: EVACUATION HEMATOMA;  Surgeon: Rosetta Posner, MD;  Location: [place];  Service: Vascular;  Laterality: Right;   HEMOSTASIS CLIP PLACEMENT  08/02/2021   Procedure: HEMOSTASIS CLIP PLACEMENT;  Surgeon: Yetta Flock, MD;  Location: [place] [facility];  Service: Gastroenterology;;   HOT HEMOSTASIS N/A 08/02/2021   Procedure: HOT HEMOSTASIS (ARGON PLASMA COAGULATION/BICAP);  Surgeon: Yetta Flock, MD;  Location: [facility];  Service: Gastroenterology;  Laterality: N/A;   LEFT HEART CATH AND CORONARY ANGIOGRAPHY N/A 10/31/2017   Procedure: LEFT HEART CATH AND CORONARY ANGIOGRAPHY;  Surgeon: Troy Sine, MD;  Location: [place] CV [facility];  Service: Cardiovascular;  Laterality: N/A;   LEFT HEART CATHETERIZATION WITH CORONARY ANGIOGRAM N/A 09/13/2014   Procedure: LEFT HEART CATHETERIZATION WITH CORONARY ANGIOGRAM;  Surgeon: Sinclair Grooms, MD;  Location: [facility] CATH LAB;  Service: Cardiovascular;  Laterality: N/A;   LOWER EXTREMITY ANGIOGRAPHY Bilateral 11/02/2016   Procedure: Lower Extremity Angiography;  Surgeon: Waynetta Sandy, MD;  Location: [place] [facility];  Service: Cardiovascular;  Laterality: Bilateral;   PERIPHERAL VASCULAR ATHERECTOMY Left 11/02/2016   Procedure: Peripheral Vascular Atherectomy;  Surgeon: Waynetta Sandy, MD;  Location: [place] CV [facility];  Service: Cardiovascular;  Laterality: Left;  PERONEAL   PERIPHERAL VASCULAR ATHERECTOMY Right 08/07/2018   Procedure: PERIPHERAL VASCULAR ATHERECTOMY;  Surgeon: [name], MD;  Location: [place] CV LAB;  Service: Cardiovascular;  Laterality: Right;  Peroneal artery   PERIPHERAL VASCULAR BALLOON ANGIOPLASTY Right 08/07/2018   Procedure: PERIPHERAL VASCULAR BALLOON ANGIOPLASTY;   Surgeon: [name], MD;  Location: [place] CV [facility];  Service: Cardiovascular;  Laterality: Right;  anterior tibial artery   STUMP REVISION Left 01/11/2017   Procedure: Revision Left Transmetatarsal Amputation;  Surgeon: [name], MD;  Location: [place];  Service: Orthopedics;  Laterality: Left;   TEE WITHOUT CARDIOVERSION N/A 11/04/2017   Procedure: TRANSESOPHAGEAL ECHOCARDIOGRAM (TEE);  Surgeon: Melrose Nakayama, MD;  Location: [place];  Service: Open Heart Surgery;  Laterality: N/A;   TRANSMETATARSAL AMPUTATION Right 08/10/2018   Procedure: RIGHT FIFTH TOE AMPUTATION;  Surgeon: [name], MD;  Location: [place];  Service: Vascular;  Laterality: Right;   TRANSMETATARSAL AMPUTATION Right 09/14/2018   Procedure: TRANSMETATARSAL AMPUTATION;  Surgeon: [name], MD;  Location: [place];  Service: Vascular;  Laterality: Right;    Allergies  Allergies  Allergen Reactions   Coconut Oil Anaphylaxis and Other (See Comments)    Can use topically, allergic to coconut foods       EKGs/Labs/Other Studies Reviewed:   The following studies were reviewed today:  09/07/2018 Echocardiogram  Study Conclusions   - Left ventricle: The cavity size was mildly dilated. Wall  thickness was normal. Systolic function was normal. The estimated    ejection fraction was in the range of 50% to 55%. Wall motion was    normal; there were no regional wall motion abnormalities. Doppler    parameters are consistent with abnormal left ventricular    relaxation (grade 1 diastolic dysfunction).  - Mitral valve: Severely calcified annulus. Mildly thickened    leaflets .  - Left atrium: The atrium was mildly dilated.  - Atrial septum: There was an atrial septal aneurysm.   Impressions:   - Normal LV systolic function; mild LVH; mild diastolic    dysfunction; severe MAC with possible small oscillating density    on posterior annulus (consider blood cultures if clinically     indicated); mild MR.   EKG:  EKG is *** ordered today.  The ekg ordered today demonstrates ***  Recent Labs: 07/31/2021: ALT 17; B Natriuretic Peptide 289.0 08/03/2021: BUN 35; Creatinine, Ser 5.79; Potassium 3.9; Sodium 132 08/04/2021: Hemoglobin 8.4; Platelets 324  Recent Lipid Panel    Component Value Date/Time   CHOL 171 02/07/2018 0937   TRIG 249 (H) 02/07/2018 0937   HDL 38 (L) 02/07/2018 0937   CHOLHDL 4.5 02/07/2018 0937   CHOLHDL 5.3 10/29/2017 0240   VLDL 53 (H) 10/29/2017 0240   LDLCALC 83 02/07/2018 0937    Risk Assessment/Calculations:  {Does this patient have ATRIAL FIBRILLATION?:[phone]}  Home Medications   No outpatient medications have been marked as taking for the 10/27/21 encounter (Appointment) with Elgie Collard, PA-C.     Review of Systems   ***   All other systems reviewed and are otherwise negative except as noted above.  Physical Exam    VS:  There were no vitals taken for this visit. , BMI There is no height or weight on file to calculate BMI.  Wt Readings from Last 3 Encounters:  08/04/21 180 lb 5.4 oz (81.8 kg)  06/23/20 194 lb 0.1 oz (88 kg)  03/15/19 169 lb 12.1 oz (77 kg)     GEN: Well nourished, well developed, in no acute distress. HEENT: normal. Neck: Supple, no JVD, carotid bruits, or masses. Cardiac: ***RRR, no murmurs, rubs, or gallops. No clubbing, cyanosis, edema.  ***Radials/PT 2+ and equal bilaterally.  Respiratory:  ***Respirations regular and unlabored, clear to auscultation bilaterally. GI: Soft, nontender, nondistended. MS: No deformity or atrophy. Skin: Warm and dry, no rash. Neuro:  Strength and sensation are intact. Psych: Normal affect.  Assessment & Plan    CAD status post CABG  CKD on HD  PVD  Diabetes mellitus  Tobacco abuse  Hyperlipidemia    Disposition: Follow up {follow up:15908} with Jenkins Rouge, MD or APP.  Signed, Elgie Collard, PA-C 10/26/2021, 7:49 PM [facility]

## 2021-10-27 ENCOUNTER — Encounter: Payer: Self-pay | Admitting: Physician Assistant

## 2021-10-27 ENCOUNTER — Ambulatory Visit (INDEPENDENT_AMBULATORY_CARE_PROVIDER_SITE_OTHER): Payer: Medicaid Other | Admitting: Physician Assistant

## 2021-10-27 ENCOUNTER — Other Ambulatory Visit: Payer: Self-pay

## 2021-10-27 VITALS — BP 131/61 | HR 92 | Ht 74.0 in | Wt 186.8 lb

## 2021-10-27 DIAGNOSIS — E785 Hyperlipidemia, unspecified: Secondary | ICD-10-CM

## 2021-10-27 DIAGNOSIS — I2510 Atherosclerotic heart disease of native coronary artery without angina pectoris: Secondary | ICD-10-CM

## 2021-10-27 DIAGNOSIS — E119 Type 2 diabetes mellitus without complications: Secondary | ICD-10-CM | POA: Diagnosis not present

## 2021-10-27 DIAGNOSIS — I739 Peripheral vascular disease, unspecified: Secondary | ICD-10-CM | POA: Diagnosis not present

## 2021-10-27 DIAGNOSIS — R011 Cardiac murmur, unspecified: Secondary | ICD-10-CM

## 2021-10-27 DIAGNOSIS — N184 Chronic kidney disease, stage 4 (severe): Secondary | ICD-10-CM | POA: Diagnosis not present

## 2021-10-27 DIAGNOSIS — Z720 Tobacco use: Secondary | ICD-10-CM

## 2021-10-27 MED ORDER — NITROGLYCERIN 0.4 MG SL SUBL
0.4000 mg | SUBLINGUAL_TABLET | SUBLINGUAL | 5 refills | Status: AC | PRN
Start: 2021-10-27 — End: ?

## 2021-10-27 MED ORDER — CLOPIDOGREL BISULFATE 75 MG PO TABS: 75.0000 mg | ORAL_TABLET | Freq: Every day | ORAL | 3 refills | Status: AC

## 2021-10-27 MED ORDER — ATORVASTATIN CALCIUM 80 MG PO TABS: 80.0000 mg | ORAL_TABLET | Freq: Every day | ORAL | 3 refills | Status: AC

## 2021-10-27 MED ORDER — CARVEDILOL 3.125 MG PO TABS: 3.1250 mg | ORAL_TABLET | Freq: Two times a day (BID) | ORAL | 3 refills | Status: DC

## 2021-10-27 MED ORDER — ISOSORBIDE MONONITRATE ER 60 MG PO TB24: 60.0000 mg | ORAL_TABLET | Freq: Every day | ORAL | 3 refills | Status: DC

## 2021-10-27 MED ORDER — BLOOD PRESSURE MONITOR/S CUFF MISC: 0 refills | Status: AC

## 2021-10-27 NOTE — Progress Notes (Signed)
? ?Office Visit  ?  ?Patient Name: [name] Flavell Sr. ?Date of Encounter: 10/27/2021 ? ?PCP:  Sandi Mariscal, MD ?  ?[place]  ?Cardiologist:  Jenkins Rouge, MD  ?Advanced Practice Provider:  No care team member to display ?Electrophysiologist:  None  ? ?Chief Complaint  ?  ?[name] Primmer Sr. is a 46 y.o. male with a hx of ESRD on HD, hypertension, CHF, DM, vascular disease with left BKA, CABG 3/19 with Dr. Roxan Hockey with LIMA to LAD, SVG to OM1 and SVG to PDA EF by TTE 3/19 50-55%. Diabetes has been poorly controlled with A1c of 9.1, and anemia who presents today for annual follow-up. ? ?He was last seen October 2019 and had recently underwent CABG in March 2019, required antibiotics for his sternum to heal.  Overall was doing well at that time.  Was encouraged to stop smoking.  Discussed a low-carb diet and a target hemoglobin A1c. ? ?Today, he was in the hospital back in December for a GI bleed.  He presented with fatigue, LE, and chest discomfort.  He was found to have a hemoglobin down to 6.8 from 12.3.  He was transfused and an EGD showed a bleeding lesion that was Cauterized.  Plavix was resumed a few days later.  His chest pain was thought to be due to demand ischemia in the setting of severe anemia.  He is a Monday Wednesday Friday dialysis patient.  He states that he does not feel good on dialysis days and thinks it could be due to the midodrine.  We have discussed keeping track of his blood pressure and provided him with a blood pressure cuff prescription today.  I encouraged him to continue taking his midodrine 10 mg 3 times daily on dialysis days but he can cut back to 5 mg 3 times daily on nondialysis days.  His blood pressure today was 131/23mmHg. Smoking of cigarettes and marijuana was discouraged.  ? ?Reports no shortness of breath nor dyspnea on exertion. Reports no chest pain, pressure, or tightness. No edema, orthopnea, PND. Reports no palpitations.   ? ?Past  Medical History  ?  ?Past Medical History:  ?Diagnosis Date  ? Anemia   ? Atherosclerosis of lower extremity ([place])   ? Blood clot in vein   ? right calf  ? CAD (coronary artery disease)   ? Cataracts, bilateral   ? Chronic combined systolic and diastolic heart failure ([place])   ? Complication of anesthesia   ? Depression   ? ESRD (end stage renal disease) on dialysis [facility])   ? "MWF [employer]" (03/08/2017)  ? GERD (gastroesophageal reflux disease)   ? Heart murmur   ? History of blood transfusion   ? "related to OR"  ? Hypertension   ? Myocardial infarction [facility])   ?  " light"  ? Nonhealing surgical wound   ? nonviable tissue  ? PONV (postoperative nausea and vomiting)   ? S/P unilateral BKA (below knee amputation), left ([place])   ? Type II diabetes mellitus ([place])   ? Wears glasses   ? ?Past Surgical History:  ?Procedure Laterality Date  ? ABDOMINAL AORTOGRAM N/A 11/02/2016  ? Procedure: Abdominal Aortogram;  Surgeon: Waynetta Sandy, MD;  Location: [place] [facility];  Service: Cardiovascular;  Laterality: N/A;  ? ABDOMINAL AORTOGRAM W/LOWER EXTREMITY Right 08/07/2018  ? Procedure: ABDOMINAL AORTOGRAM W/LOWER EXTREMITY;  Surgeon: [name], MD;  Location: [place] [facility];  Service: Cardiovascular;  Laterality:  Right;  ? AMPUTATION Left 09/27/2013  ? Procedure: LEFT GREAT TOE AMPUTATION;  Surgeon: [name], MD;  Location: [place];  Service: Orthopedics;  Laterality: Left;  ? AMPUTATION Right 08/15/2015  ? Procedure: Right Great Toe Amputation;  Surgeon: [name], MD;  Location: [place];  Service: Orthopedics;  Laterality: Right;  ? AMPUTATION Left 11/05/2016  ? Procedure: TRANSMETATARSAL AMPUTATION LEFT FOOT;  Surgeon: [name], MD;  Location: [place];  Service: Orthopedics;  Laterality: Left;  ? AMPUTATION Left 03/11/2017  ? Procedure: LEFT BELOW KNEE AMPUTATION;  Surgeon: [name], MD;  Location: [place];  Service: Orthopedics;  Laterality: Left;  ? AMPUTATION Right 03/11/2017  ?  Procedure: RIGHT 2ND TOE AMPUTATION;  Surgeon: [name], MD;  Location: [place];  Service: Orthopedics;  Laterality: Right;  ? AMPUTATION Right 11/17/2018  ? Procedure: AMPUTATION BELOW KNEE;  Surgeon: [name], MD;  Location: [place];  Service: Vascular;  Laterality: Right;  ? AV FISTULA PLACEMENT  left arm  ? CORONARY ARTERY BYPASS GRAFT N/A 11/04/2017  ? Procedure: CORONARY ARTERY BYPASS GRAFTING (CABG) x three, using left internal mammary artery and right    leg greater saphenous vein;  Surgeon: Melrose Nakayama, MD;  Location: [place];  Service: Open Heart Surgery;  Laterality: N/A;  ? ESOPHAGOGASTRODUODENOSCOPY (EGD) WITH PROPOFOL N/A 08/02/2021  ? Procedure: ESOPHAGOGASTRODUODENOSCOPY (EGD) WITH PROPOFOL;  Surgeon: Yetta Flock, MD;  Location: [place];  Service: Gastroenterology;  Laterality: N/A;  ? FASCIOTOMY Right 08/07/2018  ? Procedure: FOUR COMPARTMENT FASCIOTOMY OF RIGHT LOWER LEG;  Surgeon: Rosetta Posner, MD;  Location: [place];  Service: Vascular;  Laterality: Right;  ? FASCIOTOMY CLOSURE Right 08/10/2018  ? Procedure: FASCIOTOMY CLOSURE RIGHT LOWER EXTREMITY;  Surgeon: [name], MD;  Location: [place];  Service: Vascular;  Laterality: Right;  ? HEMATOMA EVACUATION Right 08/07/2018  ? Procedure: EVACUATION HEMATOMA;  Surgeon: Rosetta Posner, MD;  Location: [place];  Service: Vascular;  Laterality: Right;  ? HEMOSTASIS CLIP PLACEMENT  08/02/2021  ? Procedure: HEMOSTASIS CLIP PLACEMENT;  Surgeon: Yetta Flock, MD;  Location: [place];  Service: Gastroenterology;;  ? HOT HEMOSTASIS N/A 08/02/2021  ? Procedure: HOT HEMOSTASIS (ARGON PLASMA COAGULATION/BICAP);  Surgeon: Yetta Flock, MD;  Location: [facility] ENDOSCOPY;  Service: Gastroenterology;  Laterality: N/A;  ? LEFT HEART CATH AND CORONARY ANGIOGRAPHY N/A 10/31/2017  ? Procedure: LEFT HEART CATH AND CORONARY ANGIOGRAPHY;  Surgeon: Troy Sine, MD;  Location: [place] CV LAB;  Service:  Cardiovascular;  Laterality: N/A;  ? LEFT HEART CATHETERIZATION WITH CORONARY ANGIOGRAM N/A 09/13/2014  ? Procedure: LEFT HEART CATHETERIZATION WITH CORONARY ANGIOGRAM;  Surgeon: Sinclair Grooms, MD;  Location: [facility] CATH LAB;  Service: Cardiovascular;  Laterality: N/A;  ? LOWER EXTREMITY ANGIOGRAPHY Bilateral 11/02/2016  ? Procedure: Lower Extremity Angiography;  Surgeon: Waynetta Sandy, MD;  Location: [place] CV LAB;  Service: Cardiovascular;  Laterality: Bilateral;  ? PERIPHERAL VASCULAR ATHERECTOMY Left 11/02/2016  ? Procedure: Peripheral Vascular Atherectomy;  Surgeon: Waynetta Sandy, MD;  Location: [place] CV LAB;  Service: Cardiovascular;  Laterality: Left;  PERONEAL  ? PERIPHERAL VASCULAR ATHERECTOMY Right 08/07/2018  ? Procedure: PERIPHERAL VASCULAR ATHERECTOMY;  Surgeon: [name], MD;  Location: [place] CV LAB;  Service: Cardiovascular;  Laterality: Right;  Peroneal artery  ? PERIPHERAL VASCULAR BALLOON ANGIOPLASTY Right 08/07/2018  ? Procedure: PERIPHERAL VASCULAR BALLOON ANGIOPLASTY;  Surgeon: [name], MD;  Location: [place]  CV LAB;  Service: Cardiovascular;  Laterality: Right;  anterior tibial artery  ? STUMP REVISION Left 01/11/2017  ? Procedure: Revision Left Transmetatarsal Amputation;  Surgeon: [name], MD;  Location: [place];  Service: Orthopedics;  Laterality: Left;  ? TEE WITHOUT CARDIOVERSION N/A 11/04/2017  ? Procedure: TRANSESOPHAGEAL ECHOCARDIOGRAM (TEE);  Surgeon: Melrose Nakayama, MD;  Location: [place];  Service: Open Heart Surgery;  Laterality: N/A;  ? TRANSMETATARSAL AMPUTATION Right 08/10/2018  ? Procedure: RIGHT FIFTH TOE AMPUTATION;  Surgeon: [name], MD;  Location: [place];  Service: Vascular;  Laterality: Right;  ? TRANSMETATARSAL AMPUTATION Right 09/14/2018  ? Procedure: TRANSMETATARSAL AMPUTATION;  Surgeon: [name], MD;  Location: [place];  Service: Vascular;  Laterality: Right;   ? ? ?Allergies ? ?Allergies  ?Allergen Reactions  ? Coconut Oil Anaphylaxis and Other (See Comments)  ?  Can use topically, allergic to coconut foods   ? ? ? ? ?EKGs/Labs/Other Studies Reviewed:  ? ?The following studies were reviewed tod

## 2021-10-27 NOTE — Patient Instructions (Signed)
Medication Instructions:  ?1.You may decrease midodrine to 5 mg three times a day on non-dialysis days ?*If you need a refill on your cardiac medications before your next appointment, please call your pharmacy* ? ? ?Lab Work: ?Lipid panel, lft's and ldl direct ?If you have labs (blood work) drawn today and your tests are completely normal, you will receive your results only by: ?MyChart Message (if you have MyChart) OR ?A paper copy in the mail ?If you have any lab test that is abnormal or we need to change your treatment, we will call you to review the results. ? ? ?Testing/Procedures: ?Your physician has requested that you have an echocardiogram. Echocardiography is a painless test that uses sound waves to create images of your heart. It provides your doctor with information about the size and shape of your heart and how well your heart?s chambers and valves are working. This procedure takes approximately one hour. There are no restrictions for this procedure.  ? ? ?Follow-Up: ?At [facility], you and your health needs are our priority.  As part of our continuing mission to provide you with exceptional heart care, we have created designated Provider Care Teams.  These Care Teams include your primary Cardiologist (physician) and Advanced Practice Providers (APPs -  Physician Assistants and Nurse Practitioners) who all work together to provide you with the care you need, when you need it. ? ?Your next appointment:   ?1 year(s) ? ?The format for your next appointment:   ?In Person ? ?Provider:   ?Jenkins Rouge, MD { ? ?Other Instructions ?Check your blood pressure every day at the same time and record your readings  ?

## 2021-10-28 LAB — LIPID PANEL
Chol/HDL Ratio: 2.9 ratio (ref 0.0–5.0)
Cholesterol, Total: 117 mg/dL (ref 100–199)
HDL: 41 mg/dL (ref >39)
LDL Chol Calc (NIH): 57 mg/dL (ref 0–99)
Triglycerides: 100 mg/dL (ref 0–149)
VLDL Cholesterol Cal: 19 mg/dL (ref 5–40)

## 2021-10-28 LAB — HEPATIC FUNCTION PANEL
ALT: 12 IU/L (ref 0–44)
AST: 18 IU/L (ref 0–40)
Albumin: 4.4 g/dL (ref 4.0–5.0)
Alkaline Phosphatase: 84 IU/L (ref 44–121)
Bilirubin Total: 0.4 mg/dL (ref 0.0–1.2)
Bilirubin, Direct: 0.15 mg/dL (ref 0.00–0.40)
Total Protein: 7.4 g/dL (ref 6.0–8.5)

## 2021-10-28 LAB — LDL CHOLESTEROL, DIRECT: LDL Direct: 59 mg/dL (ref 0–99)

## 2021-11-10 ENCOUNTER — Ambulatory Visit (HOSPITAL_COMMUNITY): Payer: Medicaid Other

## 2021-11-24 ENCOUNTER — Ambulatory Visit (HOSPITAL_COMMUNITY): Payer: Medicaid Other | Attending: Cardiology

## 2021-11-24 DIAGNOSIS — R011 Cardiac murmur, unspecified: Secondary | ICD-10-CM | POA: Insufficient documentation

## 2021-11-24 LAB — ECHOCARDIOGRAM COMPLETE
AR max vel: 2.39 cm2
AV Area VTI: 2.33 cm2
AV Area mean vel: 2.19 cm2
AV Mean grad: 8.7 mmHg
AV Peak grad: 16.1 mmHg
Ao pk vel: 2 m/s
Area-P 1/2: 3.83 cm2
S' Lateral: 5.1 cm

## 2021-12-06 ENCOUNTER — Encounter (HOSPITAL_COMMUNITY): Payer: Self-pay | Admitting: Emergency Medicine

## 2021-12-06 ENCOUNTER — Emergency Department (HOSPITAL_COMMUNITY): Payer: Medicaid Other

## 2021-12-06 ENCOUNTER — Emergency Department (HOSPITAL_COMMUNITY)
Admission: EM | Admit: 2021-12-06 | Discharge: 2021-12-07 | Disposition: A | Discharge status: Home / Self Care | Payer: Medicaid Other | Attending: Emergency Medicine | Admitting: Emergency Medicine

## 2021-12-06 ENCOUNTER — Other Ambulatory Visit: Payer: Self-pay

## 2021-12-06 DIAGNOSIS — N186 End stage renal disease: Secondary | ICD-10-CM | POA: Diagnosis not present

## 2021-12-06 DIAGNOSIS — R531 Weakness: Secondary | ICD-10-CM | POA: Diagnosis not present

## 2021-12-06 DIAGNOSIS — Z20822 Contact with and (suspected) exposure to covid-19: Secondary | ICD-10-CM | POA: Diagnosis not present

## 2021-12-06 DIAGNOSIS — Z992 Dependence on renal dialysis: Secondary | ICD-10-CM | POA: Diagnosis not present

## 2021-12-06 DIAGNOSIS — R5383 Other fatigue: Secondary | ICD-10-CM | POA: Diagnosis present

## 2021-12-06 DIAGNOSIS — R051 Acute cough: Secondary | ICD-10-CM | POA: Diagnosis not present

## 2021-12-06 LAB — CBG MONITORING, ED: Glucose-Capillary: 292 mg/dL — ABNORMAL HIGH (ref 70–99)

## 2021-12-06 NOTE — ED Triage Notes (Signed)
Patient arrived via EMS from home with complaints weakness, nausea and HTN since completing dialysis on Wednesday and Friday. Patient reports that he had iron infusion at dialysis center on Wednesday. EMS reported [id number].  ?

## 2021-12-06 NOTE — ED Provider Notes (Signed)
?[place] [facility] DEPT ?[facility] Emergency Department ?Provider Note ?MRN:  [id number]  ?Arrival date & time: 12/07/21    ? ?Chief Complaint   ?Weakness and Hypertension ?  ?History of Present Illness   ?[name] Rudd Sr. is a 46 y.o. year-old male presents to the ED with chief complaint of fatigue and generalized weakness. He states that this has been going on for the past 2 days.  States that he hasn't been able to eat in 48 hours, but denies vomiting, just lack of energy.  Denies any fever or chills.  He states that he does have some cough, but denies SOB.  Was last dialyzed on Friday and completed his session and also got an iron infusion.  Denies having any pain. ? ?History provided by patient. ? ? ?Review of Systems  ?Pertinent review of systems noted in HPI.  ? ? ?Physical Exam  ? ?Vitals:  ? 12/07/21 0048 12/07/21 0049  ?BP: (!) 173/97   ?Pulse: 88   ?Resp: 18   ?Temp: 97.9 ?F (36.6 ?C)   ?SpO2: 97% 99%  ?  ?CONSTITUTIONAL:  nontoxic-appearing, NAD ?NEURO:  Alert and oriented x 3, CN 3-12 grossly intact ?EYES:  eyes equal and reactive ?ENT/NECK:  Supple, no stridor  ?CARDIO:  normal rate, regular rhythm, appears well-perfused  ?PULM:  No respiratory distress, CTAB ?GI/GU:  non-distended, non-tender ?MSK/SPINE:  No gross deformities, no edema, moves all extremities  ?SKIN:  no rash, atraumatic ? ? ?*Additional and/or pertinent findings included in MDM below ? ?Diagnostic and Interventional Summary  ? ? EKG Interpretation ? ?Date/Time:    ?Ventricular Rate:    ?PR Interval:    ?QRS Duration:   ?QT Interval:    ?QTC Calculation:   ?R Axis:     ?Text Interpretation:   ?  ? ?  ? ?Labs Reviewed  ?CBC WITH DIFFERENTIAL/PLATELET - Abnormal; Notable for the following components:  ?    Result Value  ? RBC 3.48 (*)   ? Hemoglobin 9.4 (*)   ? HCT 29.7 (*)   ? RDW 19.9 (*)   ? nRBC 0.4 (*)   ? All other components within normal limits  ?COMPREHENSIVE METABOLIC PANEL - Abnormal; Notable for the following  components:  ? Sodium 132 (*)   ? Potassium 3.4 (*)   ? Chloride 95 (*)   ? CO2 21 (*)   ? Glucose, Bld 258 (*)   ? BUN 41 (*)   ? Creatinine, Ser 11.73 (*)   ? Calcium 8.3 (*)   ? Albumin 3.3 (*)   ? Total Bilirubin 1.4 (*)   ? GFR, Estimated 5 (*)   ? Anion gap 16 (*)   ? All other components within normal limits  ?CBG MONITORING, ED - Abnormal; Notable for the following components:  ? Glucose-Capillary 292 (*)   ? All other components within normal limits  ?RESP PANEL BY RT-PCR (FLU A&B, COVID) [id number]  ?  ?DG Chest 2 View  ?Final Result  ?  ?  ?Medications - No data to display  ? ?Procedures  /  Critical Care ?Procedures ? ?ED Course and Medical Decision Making  ?I have reviewed the triage vital signs, the nursing notes, and pertinent available records from the EMR. ? ?Complexity of Problems Addressed: ?High Complexity: Acute illness/injury posing a threat to life or bodily function, requiring emergent diagnostic workup, evaluation, and treatment as below. ?Comorbidities affecting this illness/injury include: ?ESRD on HD ?Social Determinants Affecting Care: ?No clinically significant social  determinants affecting this chief complaint.. ? ? ?ED Course: ?After considering the following differential, volume overload, hyperkalemia, hypoglycemia, anemia, I ordered labs and chest x-ray. ?I personally interpreted the labs which are notable for potassium of 3.4, glucose 258, HGB 9.4, but this is improved from recent. ?I visualized the chest x-ray which is notable for cardiomegaly and vascular congestion and agree with the radiologist interpretation.. ? ?  ? ?Consultants: ?No consultations were needed in caring for this patient. ? ?Treatment and Plan: ?Patient here with generalized fatigue.  He denies having fever or any pain.  He is not hypoxic.  Ambulates maintaining 99% on room air.  He is scheduled for dialysis in the morning.  At this time, based on his overall good clinical appearance and reassuring labs, I do not  think that he needs emergent dialysis right now and feel that he can be safely discharged so he can go to his regularly scheduled dialysis in the morning.  Patient is agreeable with this plan and asks for something to eat.  He is given some food and will be discharged. ? ?I considered admission due to patient's initial presentation, but after considering the examination and diagnostic results, patient will not require admission and can be discharged with outpatient follow-up. ? ? ? ?Final Clinical Impressions(s) / ED Diagnoses  ? ?  ICD-10-CM   ?1. Other fatigue  R53.83   ?  ?  ?ED Discharge Orders   ? ? None  ? ?  ?  ? ? ?Discharge Instructions Discussed with and Provided to Patient:  ? ? ? ?Discharge Instructions   ? ?  ?Go to dialysis in the morning.  Follow-up with your doctor. ? ?Your blood work looked good.  Your hemoglobin was better than it has been recently. ? ?Your oxygen levels were normal. ? ? ? ? ?  ?Montine Circle, PA-C ?12/07/21 0102 ? ?  ?Carmin Muskrat, MD ?12/09/21 1525 ? ?

## 2021-12-07 LAB — COMPREHENSIVE METABOLIC PANEL WITH GFR
ALT: 21 U/L (ref 0–44)
AST: 23 U/L (ref 15–41)
Albumin: 3.3 g/dL — ABNORMAL LOW (ref 3.5–5.0)
Alkaline Phosphatase: 65 U/L (ref 38–126)
Anion gap: 16 — ABNORMAL HIGH (ref 5–15)
BUN: 41 mg/dL — ABNORMAL HIGH (ref 6–20)
CO2: 21 mmol/L — ABNORMAL LOW (ref 22–32)
Calcium: 8.3 mg/dL — ABNORMAL LOW (ref 8.9–10.3)
Chloride: 95 mmol/L — ABNORMAL LOW (ref 98–111)
Creatinine, Ser: 11.73 mg/dL — ABNORMAL HIGH (ref 0.61–1.24)
GFR, Estimated: 5 mL/min — ABNORMAL LOW (ref >60)
Glucose, Bld: 258 mg/dL — ABNORMAL HIGH (ref 70–99)
Potassium: 3.4 mmol/L — ABNORMAL LOW (ref 3.5–5.1)
Sodium: 132 mmol/L — ABNORMAL LOW (ref 135–145)
Total Bilirubin: 1.4 mg/dL — ABNORMAL HIGH (ref 0.3–1.2)
Total Protein: 6.7 g/dL (ref 6.5–8.1)

## 2021-12-07 LAB — RESP PANEL BY RT-PCR (FLU A&B, COVID) ARPGX2
Influenza A by PCR: NEGATIVE
Influenza B by PCR: NEGATIVE
SARS Coronavirus 2 by RT PCR: NEGATIVE

## 2021-12-07 LAB — CBC WITH DIFFERENTIAL/PLATELET
Abs Immature Granulocytes: 0.03 K/uL (ref 0.00–0.07)
Basophils Absolute: 0 K/uL (ref 0.0–0.1)
Basophils Relative: 0 %
Eosinophils Absolute: 0.1 K/uL (ref 0.0–0.5)
Eosinophils Relative: 1 %
HCT: 29.7 % — ABNORMAL LOW (ref 39.0–52.0)
Hemoglobin: 9.4 g/dL — ABNORMAL LOW (ref 13.0–17.0)
Immature Granulocytes: 0 %
Lymphocytes Relative: 30 %
Lymphs Abs: 2 K/uL (ref 0.7–4.0)
MCH: 27 pg (ref 26.0–34.0)
MCHC: 31.6 g/dL (ref 30.0–36.0)
MCV: 85.3 fL (ref 80.0–100.0)
Monocytes Absolute: 0.4 K/uL (ref 0.1–1.0)
Monocytes Relative: 6 %
Neutro Abs: 4.2 K/uL (ref 1.7–7.7)
Neutrophils Relative %: 63 %
Platelets: 284 K/uL (ref 150–400)
RBC: 3.48 MIL/uL — ABNORMAL LOW (ref 4.22–5.81)
RDW: 19.9 % — ABNORMAL HIGH (ref 11.5–15.5)
WBC: 6.7 K/uL (ref 4.0–10.5)
nRBC: 0.4 % — ABNORMAL HIGH (ref 0.0–0.2)

## 2021-12-07 NOTE — Discharge Instructions (Signed)
Go to dialysis in the morning.  Follow-up with your doctor. ? ?Your blood work looked good.  Your hemoglobin was better than it has been recently. ? ?Your oxygen levels were normal. ?

## 2021-12-07 NOTE — ED Notes (Signed)
SpO2-97 while sitting in bed and SpO2-99 while ambulating ?

## 2021-12-19 ENCOUNTER — Telehealth: Payer: Self-pay | Admitting: Internal Medicine

## 2021-12-19 NOTE — Telephone Encounter (Signed)
Triage call received from the patient's friend [name] on behalf of [name] regarding ongoing weakness and fatigue. Call returned, no answer. Appears he was evaluated in the ED for the same complains 2 weeks ago with no emergent findings.  ?

## 2021-12-21 DIAGNOSIS — E785 Hyperlipidemia, unspecified: Secondary | ICD-10-CM | POA: Insufficient documentation

## 2021-12-21 NOTE — Progress Notes (Signed)
?Cardiology Office Note:   ? ?Date:  12/22/2021  ? ?ID:  [name] Labella Sr., DOB [birth year], MRN [id number] ? ?PCP:  Sandi Mariscal, MD  ?[facility] HeartCare Providers ?Cardiologist:  Jenkins Rouge, MD    ?Referring MD: Sandi Mariscal, MD  ? ?Chief Complaint:  Shortness of Breath ?  ? ?Patient Profile: ?HFmrEF (heart failure with mildly reduced ejection fraction)  ?Previous HFpEF - Echocardiogram 08/2018: EF 50-55 ?Echocardiogram 11/2021: EF 40-45 ?Coronary artery disease  ?S/p CABG in 10/2017 ?Diabetes mellitus  ?Peripheral arterial disease  ?S/p Bilat BKA  ?ESRD ?Hypertension  ?Hyperlipidemia  ?GI bleed in Dec 2022 ?Chest pain 2/2 demand ischemia  ?+Cigs, marijuana  ? ? ?Prior CV Studies: ?ECHO COMPLETE WO IMAGING ENHANCING AGENT 11/24/2021 ?EF 40-45, global HK, GLS -12.8, moderate LVH, mildly reduced RVSF, normal PASP, RVSP 34.8, moderate LAE, mild-moderate MR, AV sclerosis without stenosis, mild TR ? ?LEFT HEART CATH AND CORONARY ANGIOGRAPHY 10/31/2017 ?? Ost 2nd Diag to 2nd Diag lesion is 99% stenosed. ?? Prox LAD to Mid LAD lesion is 99% stenosed. ?? Ost 2nd Mrg to 2nd Mrg lesion is 80% stenosed. ?? Mid Cx to Dist Cx lesion is 100% stenosed. ?? Ost RCA to Prox RCA lesion is 20% stenosed. ?? Mid RCA lesion is 40% stenosed. ?? Dist RCA-1 lesion is 90% stenosed. ?? Dist RCA-2 lesion is 80% stenosed. ?? The left ventricular systolic function is normal. ?? LV end diastolic pressure is normal. ?Mild LV dysfunction with an ejection fraction of 40 to less than 45% with distal anterolateral apical hypocontractility.  LVEDP 5-7 mm. ?RECOMMENDATION: ?With the extensive coronary calcification and multivessel CAD recommend surgical consultation for CABG revascularization. ? ?Carotid US 11/01/17 ?Bilateral ICA 1-39 ? ?History of Present Illness:   ?[name] Petrovic Sr. is a 46 y.o. male with the above problem list.  He was last seen by Nicholes Rough, PA-C in March 2023.  He had been in the hospital in Dec 2022 with chest pain in the  setting of profound anemia from a GI bleed.  He was noted to have a murmur on exam.  A f/u echocardiogram demonstrated reduced LVF with EF 40-45.  There was mild to mod MR and AV sclerosis.   ? ?He returns for weakness and shortness of breath.  He is here with his friend.  He has been feeling poorly for 2-3 weeks.  He went to the ED 4/23.  He had a high BP en route and was told by ED to stop his midodrine.  Of note, he is on coreg 3.125 mg twice daily and Imdur 60 mg once daily.  He takes all of his medications all at once one time a day.  He uses prosthetic limbs to ambulate.  He is short of breath with minimal activity.  He is short of breath with walking across the room to the bathroom.  He has not had orthopnea.  His thighs are not swollen.  He has not had chest pain.  He has not had syncope.  He notes that he has felt worse since his midodrine was stopped.   ?   ?Past Medical History:  ?Diagnosis Date  ? Anemia   ? Atherosclerosis of lower extremity ([place])   ? Blood clot in vein   ? right calf  ? CAD (coronary artery disease)   ? Cataracts, bilateral   ? Chronic combined systolic and diastolic heart failure ([place])   ? Complication of anesthesia   ? Depression   ? ESRD (end  stage renal disease) on dialysis [facility])   ? "MWF [employer]" (03/08/2017)  ? GERD (gastroesophageal reflux disease)   ? Heart murmur   ? History of blood transfusion   ? "related to OR"  ? Hypertension   ? Myocardial infarction [facility])   ?  " light"  ? Nonhealing surgical wound   ? nonviable tissue  ? PONV (postoperative nausea and vomiting)   ? S/P unilateral BKA (below knee amputation), left ([place])   ? Type II diabetes mellitus ([place])   ? Wears glasses   ? ?Current Medications: ?Current Meds  ?Medication Sig  ? acetaminophen (TYLENOL) 325 MG tablet Take 2 tablets (650 mg total) by mouth every 4 (four) hours as needed for headache or mild pain.  ? aspirin EC 81 MG tablet Take 1 tablet (81 mg total) by mouth daily.  ? atorvastatin (LIPITOR) 80 MG  tablet Take 1 tablet (80 mg total) by mouth daily.  ? Blood Pressure Monitoring (BLOOD PRESSURE MONITOR/S CUFF) MISC Check blood pressure every day  ? calcitRIOL (ROCALTROL) 0.25 MCG capsule Take 5 capsules (1.25 mcg total) by mouth every Monday, Wednesday, and Friday with hemodialysis.  ? calcium carbonate (TUMS - DOSED IN MG ELEMENTAL CALCIUM) 500 MG chewable tablet Chew 500 mg by mouth daily.  ? clopidogrel (PLAVIX) 75 MG tablet Take 1 tablet (75 mg total) by mouth daily.  ? Darbepoetin Alfa (ARANESP) 200 MCG/0.4ML SOSY injection Inject 0.4 mLs (200 mcg total) into the vein every Monday with hemodialysis.  ? insulin aspart (NOVOLOG) 100 UNIT/ML FlexPen Inject 1-6 Units into the skin 3 (three) times daily with meals. CBG 151 - 200: 1 unit, CBG 201-250: 2 units, CBG 251-300: 3 units, CBG 301-350: 4 units, CBG 351-400: 5 units, CBG > 400: Give 6 units  ? insulin glargine (LANTUS) 100 UNIT/ML Solostar Pen Inject 5 Units into the skin daily.  ? isosorbide mononitrate (IMDUR) 30 MG 24 hr tablet Take 1 tablet (30 mg total) by mouth daily.  ? lanthanum (FOSRENOL) 1000 MG chewable tablet Chew 2 tablets (2,000 mg total) by mouth 3 (three) times daily with meals.  ? methocarbamol (ROBAXIN) 500 MG tablet Take 1,000 mg by mouth 4 (four) times daily as needed for muscle spasms.  ? metoprolol succinate (TOPROL XL) 25 MG 24 hr tablet Take 1 tablet (25 mg total) by mouth daily.  ? multivitamin (RENA-VIT) TABS tablet Take 1 tablet by mouth at bedtime.  ? nitroGLYCERIN (NITROSTAT) 0.4 MG SL tablet Place 1 tablet (0.4 mg total) under the tongue every 5 (five) minutes as needed for chest pain.  ? pantoprazole (PROTONIX) 40 MG tablet Take 1 tablet (40 mg total) by mouth daily.  ? pantoprazole (PROTONIX) 40 MG tablet Take 40 mg by mouth daily.  ? [DISCONTINUED] carvedilol (COREG) 3.125 MG tablet Take 1 tablet (3.125 mg total) by mouth 2 (two) times daily.  ? [DISCONTINUED] isosorbide mononitrate (IMDUR) 60 MG 24 hr tablet Take 1 tablet  (60 mg total) by mouth at bedtime.  ?  ?Allergies:   Coconut oil  ? ?Social History  ? ?Tobacco Use  ? Smoking status: Former  ?  Packs/day: 0.50  ?  Years: 26.00  ?  Pack years: 13.00  ?  Types: Cigarettes  ?  Quit date: 10/25/2018  ?  Years since quitting: 3.1  ? Smokeless tobacco: Never  ?Vaping Use  ? Vaping Use: Never used  ?Substance Use Topics  ? Alcohol use: Not Currently  ?  Comment:  "haven't took  a drink in over 5 years"  ? Drug use: Yes  ?  Types: Marijuana  ?  Comment: occasional  ?  ?Family Hx: ?The patient's family history includes Heart failure in his mother; Hypertension in his mother. ? ?Review of Systems  ?Respiratory:  Positive for cough (clear sputum). Negative for hemoptysis.   ?Gastrointestinal:  Positive for vomiting (GI consult pending). Negative for hematochezia and melena.  ?Genitourinary:   ?     Anuric   ? ?EKGs/Labs/Other Test Reviewed:   ? ?EKG:  EKG is note ordered today.  The ekg ordered today demonstrates n/a ? ?EKG from 12/06/21 in the ED was personally reviewed and interpreted - NSR, HR 76, normal axis, inf-lat TW inversions, QTc 526. ? ? ?Recent Labs: ?07/31/2021: B Natriuretic Peptide 289.0 ?12/06/2021: ALT 21; BUN 41; Creatinine, Ser 11.73; Hemoglobin 9.4; Platelets 284; Potassium 3.4; Sodium 132  ? ?Recent Lipid Panel ?Recent Labs  ?  10/27/21 ?1310  ?CHOL 117  ?TRIG 100  ?HDL 41  ?[place] 57  ?LDLDIRECT 59  ?  ? ?Risk Assessment/Calculations:   ?  ?    ?Physical Exam:   ? ?VS:  BP 115/66   Pulse 74   Ht 6\' 2"  (1.88 m)   Wt 186 lb 12.8 oz (84.7 kg)   SpO2 98%   BMI 23.98 kg/m?    ? ?Wt Readings from Last 3 Encounters:  ?12/22/21 186 lb 12.8 oz (84.7 kg)  ?10/27/21 186 lb 12.8 oz (84.7 kg)  ?08/04/21 180 lb 5.4 oz (81.8 kg)  ?  ?Constitutional:   ?   Appearance: Not in distress. Chronically ill-appearing.  ?Pulmonary:  ?   Effort: Pulmonary effort is normal.  ?   Breath sounds: No wheezing. No rales.  ?Cardiovascular:  ?   Normal rate. Regular rhythm. Normal S1. Normal S2.    ?   Murmurs: There is no murmur.  ?Edema: ?   Peripheral edema absent.  ?   Comments: thighs ?Abdominal:  ?   Palpations: Abdomen is soft.  ?Musculoskeletal:  ?   Cervical back: Neck supple. Skin: ?   General:

## 2021-12-22 ENCOUNTER — Encounter: Payer: Self-pay | Admitting: Physician Assistant

## 2021-12-22 ENCOUNTER — Ambulatory Visit (INDEPENDENT_AMBULATORY_CARE_PROVIDER_SITE_OTHER): Payer: Medicaid Other | Admitting: Physician Assistant

## 2021-12-22 VITALS — BP 115/66 | HR 74 | Ht 74.0 in | Wt 186.8 lb

## 2021-12-22 DIAGNOSIS — Z8719 Personal history of other diseases of the digestive system: Secondary | ICD-10-CM

## 2021-12-22 DIAGNOSIS — I2510 Atherosclerotic heart disease of native coronary artery without angina pectoris: Secondary | ICD-10-CM

## 2021-12-22 DIAGNOSIS — I5020 Unspecified systolic (congestive) heart failure: Secondary | ICD-10-CM

## 2021-12-22 DIAGNOSIS — E785 Hyperlipidemia, unspecified: Secondary | ICD-10-CM | POA: Diagnosis not present

## 2021-12-22 DIAGNOSIS — N186 End stage renal disease: Secondary | ICD-10-CM

## 2021-12-22 DIAGNOSIS — Z89512 Acquired absence of left leg below knee: Secondary | ICD-10-CM

## 2021-12-22 DIAGNOSIS — I10 Essential (primary) hypertension: Secondary | ICD-10-CM

## 2021-12-22 DIAGNOSIS — I739 Peripheral vascular disease, unspecified: Secondary | ICD-10-CM

## 2021-12-22 DIAGNOSIS — E1022 Type 1 diabetes mellitus with diabetic chronic kidney disease: Secondary | ICD-10-CM

## 2021-12-22 DIAGNOSIS — Z992 Dependence on renal dialysis: Secondary | ICD-10-CM

## 2021-12-22 DIAGNOSIS — Z89511 Acquired absence of right leg below knee: Secondary | ICD-10-CM

## 2021-12-22 MED ORDER — METOPROLOL SUCCINATE ER 25 MG PO TB24: 25.0000 mg | ORAL_TABLET | Freq: Every day | ORAL | 3 refills | Status: AC

## 2021-12-22 MED ORDER — ISOSORBIDE MONONITRATE ER 30 MG PO TB24: 30.0000 mg | ORAL_TABLET | Freq: Every day | ORAL | 3 refills | Status: AC

## 2021-12-22 NOTE — Assessment & Plan Note (Signed)
Hemodialysis every M, W, F. ?

## 2021-12-22 NOTE — Assessment & Plan Note (Signed)
He ambulates with prosthetic limbs ?

## 2021-12-22 NOTE — Assessment & Plan Note (Addendum)
EF is newly depressed at 40-45 by recent echocardiogram.  No WMA noted on this echocardiogram.  He is not having chest pain but he is short of breath with minimal activity despite getting to dry wt with dialysis.  He did have pulmonary edema on his CXR in the ED 4/23.  However, he had dialysis the next day.  His lungs today are clear.  He is taking all of his meds all at once one time a day.  His BP is difficult to hear.  I was able to palpate it at 574 systolic.  He feels worse since stopping the midodrine.  Question if his shortness of breath is related to low BPs vs ongoing anemia (although Hgb is better) vs CHF vs ischemia.  As noted, his volume seems ok at this time.  He tells me he is getting to dry weight and has been below dry weight a few times as well.  Reviewed his case today with Dr. Harrington Challenger (attending MD).  She reviewed his echocardiogram and thinks his EF is more like 45-50.  Will pursue stress testing as outlined.  I will change his beta-blocker to once daily given issues taking meds. ?? Arrange Cardiac PET ?? DC Coreg ?? Toprol XL 25 mg once daily  ?? Imdur 30 mg once daily  ?? Titrate GDMT as BP will allow ?? Volume mgmt per dialysis  ?

## 2021-12-22 NOTE — Assessment & Plan Note (Signed)
LDL 59 in March 2023.  Continue Lipitor 80 mg once daily. ?

## 2021-12-22 NOTE — Assessment & Plan Note (Signed)
BP tends to run low.  We had a very hard time getting his BP today.  His BP seems to be 748 systolic.  He feels worse since stopping midodrine.  He does not take twice daily medications as he should.  He takes all of his meds all at once.  I will change Carvedilol to Toprol as noted and reduce Imdur to 30 mg once daily to see if this helps.   ?

## 2021-12-22 NOTE — Patient Instructions (Addendum)
Medication Instructions:  ?Your physician has recommended you make the following change in your medication:  ? STOP Carvedilol ? START Toprol Xl 25 mg taking 1 daily ? REDUCE Imdur to 30 taking 1 daily  ? ?*If you need a refill on your cardiac medications before your next appointment, please call your pharmacy* ? ? ?Lab Work: ?None ordered ? ?If you have labs (blood work) drawn today and your tests are completely normal, you will receive your results only by: ?MyChart Message (if you have MyChart) OR ?A paper copy in the mail ?If you have any lab test that is abnormal or we need to change your treatment, we will call you to review the results. ? ? ?Testing/Procedures:  WE WILL CALL YOU WHEN WE DECIDE WHICH ONE IS BEST FOR YOU ? ?How to Prepare for Your Cardiac PET/CT Stress Test: ? ?1. Please do not take these medications before your test:  ? ?Medications that may interfere with the cardiac pharmacological stress agent (ex. nitrates or beta-blockers) the day of the exam. ?Theophylline containing medications for 12 hours. ?Dipyridamole 48 hours prior to the test. ?Your remaining medications may be taken with water. ? ?2. Nothing to eat or drink, except water, 3 hours prior to arrival time.   ?NO caffeine/decaffeinated products, or chocolate 12 hours prior to arrival. ? ?3. NO perfume, cologne or lotion ? ?4. Total time is 1 to 2 hours; you may want to bring reading material for the waiting time. ? ?5. Please report to Admitting at the [facility] 60 minutes early for your test. ? [address], [place] [address] ? ?Diabetic Preparation:  ?Hold oral medications. ?You may take NPH and Lantus insulin. ?Do not take Humalog or Humulin R (Regular Insulin) the day of your test. ?Check blood sugars prior to leaving the house. ?If able to eat breakfast prior to 3 hour fasting, you may take all medications, including your insulin, ?Do not worry if you miss your breakfast dose of insulin -  start at your next meal. ? ?IF YOU THINK YOU MAY BE PREGNANT, OR ARE NURSING PLEASE INFORM THE TECHNOLOGIST. ? ?In preparation for your appointment, medication and supplies will be purchased.  Appointment availability is limited, so if you need to cancel or reschedule, please call the Radiology Department at [phone]  24 hours in advance to avoid a cancellation fee of $100.00 ? ?What to Expect After you Arrive: ? ?Once you arrive and check in for your appointment, you will be taken to a preparation room within the Radiology Department.  A technologist or Nurse will obtain your medical history, verify that you are correctly prepped for the exam, and explain the procedure.  Afterwards,  an IV will be started in your arm and electrodes will be placed on your skin for EKG monitoring during the stress portion of the exam. Then you will be escorted to the PET/CT scanner.  There, staff will get you positioned on the scanner and obtain a blood pressure and EKG.  During the exam, you will continue to be connected to the EKG and blood pressure machines.  A small, safe amount of a radioactive tracer will be injected in your IV to obtain a series of pictures of your heart along with an injection of a stress agent.   ? ?After your Exam: ? ?It is recommended that you eat a meal and drink a caffeinated beverage to counter act any effects of the stress agent.  Drink  plenty of fluids for the remainder of the day and urinate frequently for the first couple of hours after the exam.  Your doctor will inform you of your test results within 7-10 business days. ? ?For questions about your test or how to prepare for your test, please call: ?[name], Cardiac Imaging Nurse Navigator  ?Gordy Clement, Cardiac Imaging Nurse Navigator ?Office: [phone] ? ? ? ?Your physician has requested that you have a lexiscan myoview. For further information please visit [email or web address]. Please follow instruction sheet, as BELOW. ? ?You are  scheduled for a Myocardial Perfusion Imaging Study ?Please arrive 15 minutes prior to your appointment time for registration and insurance purposes. ? ?The test will take approximately 3 to 4 hours to complete; you may bring reading material.  If someone comes with you to your appointment, they will need to remain in the main lobby due to limited space in the testing area. **If you are pregnant or breastfeeding, please notify the nuclear lab prior to your appointment** ? ?How to prepare for your Myocardial Perfusion Test: ?Do not eat or drink 3 hours prior to your test, except you may have water. ?Do not consume products containing caffeine (regular or decaffeinated) 12 hours prior to your test. (ex: coffee, chocolate, sodas, tea). ?Do bring a list of your current medications with you.  If not listed below, you may take your medications as normal. ?Do wear comfortable clothes (no dresses or overalls) and walking shoes, tennis shoes preferred (No heels or open toe shoes are allowed). ?Do NOT wear cologne, perfume, aftershave, or lotions (deodorant is allowed). ?If these instructions are not followed, your test will have to be rescheduled. ? ? ?Follow-Up: ?At [facility], you and your health needs are our priority.  As part of our continuing mission to provide you with exceptional heart care, we have created designated Provider Care Teams.  These Care Teams include your primary Cardiologist (physician) and Advanced Practice Providers (APPs -  Physician Assistants and Nurse Practitioners) who all work together to provide you with the care you need, when you need it. ? ?We recommend signing up for the patient portal called "MyChart".  Sign up information is provided on this After Visit Summary.  MyChart is used to connect with patients for Virtual Visits (Telemedicine).  Patients are able to view lab/test results, encounter notes, upcoming appointments, etc.  Non-urgent messages can be sent to your provider as well.    ?To learn more about what you can do with MyChart, go to [email or web address].   ? ?Your next appointment:   ?SOMEONE WILL CALL YOU  ? ?The format for your next appointment:   ?In Person ? ?Provider:   ?Jenkins Rouge, MD  or Richardson Dopp, PA-C       ? ? ?Other Instructions ? ? ?Important Information About Sugar ? ? ? ? ?  ?

## 2021-12-22 NOTE — Assessment & Plan Note (Addendum)
S/p CABG in 2019.  He has not had chest pain.  His hs-Trops were elevated and flat when he was admitted with GI bleed in Dec 2022.  It was felt this was related to demand ischemia (Hgb nadir 6.6).  His EF is now low at 40-45 but w/o WMA on echocardiogram.  Dr. Harrington Challenger reviewed his Echocardiogram and his EF is probably more like 45-50.  Question if his shortness of breath is an anginal equivalent.  Will plan on stress test initially to evaluate for ischemia.   ?? Continue ASA 81 mg once daily, Plavix 75 mg once daily, Lipitor 80 mg once daily ?? ? Coreg to Toprol XL 25 mg once daily  ?? Arrange Cardiac PET ?? ? - If low risk stress test >> consider DC ASA and remain on Plavix alone (hx of upper GI bleed) ?? F/u Dr. Johnsie Cancel 2-4 weeks ?

## 2021-12-22 NOTE — Assessment & Plan Note (Signed)
He has not noticed any melena or hematochezia.  His friend notes his Hgb was 11 at dialysis yesterday.   ?

## 2022-01-06 NOTE — Progress Notes (Incomplete)
Cardiology Office Note:    Date:  01/06/2022   ID:  [name], DOB 30-Mar-1976, MRN [id number]  PCP:  Sandi Mariscal, MD  [facility] Prov[facility] Cardiologist:  Jenkins Rouge, MD    Referring MD: Sandi Mariscal, MD   Chief Complaint:  No chief complaint on file.    Patient Profile: HFmrEF (heart failure with mildly reduced ejection fraction)  Previous HFpEF - Echocardiogram 08/2018: EF 50-55 Echocardiogram 11/2021: EF 40-45 Coronary artery disease  S/p CABG in 10/2017 Diabetes mellitus  Peripheral arterial disease  S/p Bilat BKA  ESRD Hypertension  Hyperlipidemia  GI bleed in Dec 2022 Chest pain 2/2 demand ischemia  +Cigs, marijuana    Prior CV Studies: ECHO COMPLETE WO IMAGING ENHANCING AGENT 11/24/2021 EF 40-45, global HK, GLS -12.8, moderate LVH, mildly reduced RVSF, normal PASP, RVSP 34.8, moderate LAE, mild-moderate MR, AV sclerosis without stenosis, mild TR  LEFT HEART CATH AND CORONARY ANGIOGRAPHY 10/31/2017  Ost 2nd Diag to 2nd Diag lesion is 99% stenosed.  Prox LAD to Mid LAD lesion is 99% stenosed.  Ost 2nd Mrg to 2nd Mrg lesion is 80% stenosed.  Mid Cx to Dist Cx lesion is 100% stenosed.  Ost RCA to Prox RCA lesion is 20% stenosed.  Mid RCA lesion is 40% stenosed.  Dist RCA-1 lesion is 90% stenosed.  Dist RCA-2 lesion is 80% stenosed.  The left ventricular systolic function is normal.  LV end diastolic pressure is normal. Mild LV dysfunction with an ejection fraction of 40 to less than 45% with distal anterolateral apical hypocontractility.  LVEDP 5-7 mm. RECOMMENDATION: With the extensive coronary calcification and multivessel CAD recommend surgical consultation for CABG revascularization.  Carotid US 11/01/17 Bilateral ICA 1-39  History of Present Illness:   [name] Humiston Sr. is a 46 y.o. male with the above problem list.  He had been in the hospital in Dec 2022 with chest pain in the setting of profound anemia from a GI bleed.  He  was noted to have a murmur on exam.  A f/u echocardiogram demonstrated reduced LVF with EF 40-45.  There was mild to mod MR and AV sclerosis.    Seen by Richardson Dopp PA 12/22/21 with weakness and dyspnea Felt poorly for 3 weeks ED visit 12/06/21 stopped his midodrine No angina and no edema in thighs He uses prosthetic legs to get around PET/CT ordered but not done yet Hct 12/06/21 was stable for him at 29.7 had been as low as 18.4 on 08/02/21   ***     Past Medical History:  Diagnosis Date  . Anemia   . Atherosclerosis of lower extremity ([place])   . Blood clot in vein    right calf  . CAD (coronary artery disease)   . Cataracts, bilateral   . Chronic combined systolic and diastolic heart failure ([place])   . Complication of anesthesia   . Depression   . ESRD (end stage renal disease) on dialysis [facility])    "MWF [facility]" (03/08/2017)  . GERD (gastroesophageal reflux disease)   . Heart murmur   . History of blood transfusion    "related to OR"  . Hypertension   . Myocardial infarction ([place])     " light"  . Nonhealing surgical wound    nonviable tissue  . PONV (postoperative nausea and vomiting)   . S/P unilateral BKA (below knee amputation), left ([place])   . Type II diabetes mellitus ([place])   . Wears glasses    Current Medications:  No outpatient medications have been marked as taking for the 01/14/22 encounter (Appointment) with Josue Hector, MD.    Allergies:   Coconut (cocos nucifera)   Social History   Tobacco Use  . Smoking status: Former    Packs/day: 0.50    Years: 26.00    Pack years: 13.00    Types: Cigarettes    Quit date: 10/25/2018    Years since quitting: 3.2  . Smokeless tobacco: Never  Vaping Use  . Vaping Use: Never used  Substance Use Topics  . Alcohol use: Not Currently    Comment:  "haven't took a drink in over 5 years"  . Drug use: Yes    Types: Marijuana    Comment: occasional    Family Hx: The patient's family history includes Heart failure in his  mother; Hypertension in his mother.  Review of Systems  Respiratory:  Positive for cough (clear sputum). Negative for hemoptysis.   Gastrointestinal:  Positive for vomiting (GI consult pending). Negative for hematochezia and melena.  Genitourinary:        Anuric    EKGs/Labs/Other Test Reviewed:    EKG:  SR rate 71 chronic lateral T wave inversions 12/06/21    Recent Labs: 07/31/2021: B Natriuretic Peptide 289.0 12/06/2021: ALT 21; BUN 41; Creatinine, Ser 11.73; Hemoglobin 9.4; Platelets 284; Potassium 3.4; Sodium 132   Recent Lipid Panel Recent Labs    10/27/21 1310  CHOL 117  TRIG 100  HDL 41  LDLCALC 57  LDLDIRECT 59      Risk Assessment/Calculations:         Physical Exam:    VS:  There were no vitals taken for this visit.    Wt Readings from Last 3 Encounters:  12/22/21 186 lb 12.8 oz (84.7 kg)  10/27/21 186 lb 12.8 oz (84.7 kg)  08/04/21 180 lb 5.4 oz (81.8 kg)    Chronically obese black male Lungs clear SEM flow LLSB Fistula LUE Abdomen benign  Post LBKA and Right forefoot amputation     ASSESSMENT & PLAN:    CAD/CABG:  2019 EF 45-50% by TTE PET/CT pending Low BP on dialysis limits GDMT on nitrates and beta blocker He is on both ASA/Plavix with history of GI bleed No chest pain Concern that dyspnea was anginal equivalent ((( HTN:  now low on dialysis 12/22/21 coreg changed to once daily Toprol and imdur dose reduced ((( GI bleed:  and anemia from renal failure check Hct continue protonix 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  Target hemoglobin A1c is 6.5 or less.  Continue current medications. PVD: post left BKA and partial right forefoot amputation f/u [facility]  Medication Adjustments/Labs and Tests Ordered: Current medicines are reviewed at length with the patient  today.  Concerns regarding medicines are outlined above.  Tests Ordered: No orders of the defined types were placed in this encounter.  Medication Changes: No orders of the defined types were placed in this encounter.  Signed, Jenkins Rouge, MD  01/06/2022 3:57 PM    [facility] [address], [place], [place]  [address] Phone: [phone]; Fax: [phone]

## 2022-01-07 ENCOUNTER — Telehealth: Payer: Self-pay

## 2022-01-07 NOTE — Telephone Encounter (Signed)
Left message for patient to call back. We need to reschedule his office visit with Dr. Johnsie Cancel where it is after his testing.

## 2022-01-14 ENCOUNTER — Ambulatory Visit: Payer: Medicaid Other | Admitting: Cardiovascular Disease

## 2022-02-22 ENCOUNTER — Telehealth (HOSPITAL_COMMUNITY): Payer: Self-pay | Admitting: Emergency Medicine

## 2022-02-22 NOTE — Telephone Encounter (Signed)
Reaching out to patient to offer assistance regarding upcoming cardiac imaging study; pt verbalizes understanding of appt date/time, parking situation and where to check in, pre-test NPO status and medications ordered, and verified current allergies; name and call back number provided for further questions should they arise [name] Bond RN Navigator Cardiac Imaging [name] [phone] office [phone] cell  Dialysis access on L, can only use R arm for IVs/BPs NPO x 3 hr No caffeine Arrival 900 Holding metoprolol, imdur

## 2022-02-23 ENCOUNTER — Encounter (HOSPITAL_COMMUNITY)
Admission: RE | Admit: 2022-02-23 | Discharge: 2022-02-23 | Disposition: A | Discharge status: Home / Self Care | Payer: Medicaid Other | Source: Ambulatory Visit | Attending: Physician Assistant | Admitting: Physician Assistant

## 2022-02-23 DIAGNOSIS — E785 Hyperlipidemia, unspecified: Secondary | ICD-10-CM | POA: Diagnosis not present

## 2022-02-23 DIAGNOSIS — Z992 Dependence on renal dialysis: Secondary | ICD-10-CM | POA: Insufficient documentation

## 2022-02-23 DIAGNOSIS — I2510 Atherosclerotic heart disease of native coronary artery without angina pectoris: Secondary | ICD-10-CM | POA: Insufficient documentation

## 2022-02-23 DIAGNOSIS — I132 Hypertensive heart and chronic kidney disease with heart failure and with stage 5 chronic kidney disease, or end stage renal disease: Secondary | ICD-10-CM | POA: Diagnosis not present

## 2022-02-23 DIAGNOSIS — N186 End stage renal disease: Secondary | ICD-10-CM | POA: Diagnosis not present

## 2022-02-23 DIAGNOSIS — I5020 Unspecified systolic (congestive) heart failure: Secondary | ICD-10-CM | POA: Diagnosis present

## 2022-02-23 DIAGNOSIS — J90 Pleural effusion, not elsewhere classified: Secondary | ICD-10-CM | POA: Insufficient documentation

## 2022-02-23 DIAGNOSIS — I10 Essential (primary) hypertension: Secondary | ICD-10-CM | POA: Insufficient documentation

## 2022-02-23 DIAGNOSIS — E1022 Type 1 diabetes mellitus with diabetic chronic kidney disease: Secondary | ICD-10-CM | POA: Diagnosis not present

## 2022-02-23 LAB — NM PET CT CARDIAC PERFUSION MULTI W/ABSOLUTE BLOODFLOW
MBFR: 2.65
Rest MBF: 0.77 ml/g/min
ST Depression (mm): 0 mm
Stress MBF: 2.04 ml/g/min

## 2022-02-23 MED ORDER — RUBIDIUM RB82 GENERATOR (RUBYFILL)
21.8500 | PACK | Freq: Once | INTRAVENOUS | Status: AC
Start: 2022-02-23 — End: 2022-02-23
  Administered 2022-02-23: 21.85 via INTRAVENOUS

## 2022-02-23 MED ORDER — RUBIDIUM RB82 GENERATOR (RUBYFILL)
22.0000 | PACK | Freq: Once | INTRAVENOUS | Status: AC
  Administered 2022-02-23: 22 via INTRAVENOUS

## 2022-02-23 MED ORDER — REGADENOSON 0.4 MG/5ML IV SOLN
INTRAVENOUS | Status: AC
  Administered 2022-02-23: 0.4 mg
  Filled 2022-02-23: qty 5

## 2022-02-26 NOTE — Progress Notes (Signed)
Cardiology Office Note:    Date:  03/05/2022   ID:  [name] Labella Sr., DOB 11/22/1975, MRN [id number]  PCP:  Sandi Mariscal, MD  [place] Prov[facility] Cardiologist:  Jenkins Rouge, MD    Referring MD: Sandi Mariscal, MD   Chief Complaint:  No chief complaint on file.    Patient Profile: HFmrEF (heart failure with mildly reduced ejection fraction)  Previous HFpEF - Echocardiogram 08/2018: EF 50-55 Echocardiogram 11/2021: EF 40-45 Coronary artery disease  S/p CABG in 10/2017 Diabetes mellitus  Peripheral arterial disease  S/p Bilat BKA  ESRD Hypertension  Hyperlipidemia  GI bleed in Dec 2022 Chest pain 2/2 demand ischemia  +Cigs, marijuana    Prior CV Studies: ECHO COMPLETE WO IMAGING ENHANCING AGENT 11/24/2021 EF 40-45, global HK, GLS -12.8, moderate LVH, mildly reduced RVSF, normal PASP, RVSP 34.8, moderate LAE, mild-moderate MR, AV sclerosis without stenosis, mild TR  LEFT HEART CATH AND CORONARY ANGIOGRAPHY 10/31/2017  Ost 2nd Diag to 2nd Diag lesion is 99% stenosed.  Prox LAD to Mid LAD lesion is 99% stenosed.  Ost 2nd Mrg to 2nd Mrg lesion is 80% stenosed.  Mid Cx to Dist Cx lesion is 100% stenosed.  Ost RCA to Prox RCA lesion is 20% stenosed.  Mid RCA lesion is 40% stenosed.  Dist RCA-1 lesion is 90% stenosed.  Dist RCA-2 lesion is 80% stenosed.  The left ventricular systolic function is normal.  LV end diastolic pressure is normal. Mild LV dysfunction with an ejection fraction of 40 to less than 45% with distal anterolateral apical hypocontractility.  LVEDP 5-7 mm. RECOMMENDATION: With the extensive coronary calcification and multivessel CAD recommend surgical consultation for CABG revascularization.  Carotid US 11/01/17 Bilateral ICA 1-39  History of Present Illness:   [name] Kant Sr. is a 46 y.o. male with the above problem list.  He was last seen by Richardson Dopp PA 12/22/21 2023.  He had been in the hospital in Dec 2022 with chest pain in the  setting of profound anemia from a GI bleed.  He was noted to have a murmur on exam.  A f/u echocardiogram demonstrated reduced LVF with EF 40-45.  There was mild to mod MR and AV sclerosis.    He went to the ED 4/23.  He had a high BP en route and was told by ED to stop his midodrine.  Of note, he is on coreg 3.125 mg twice daily and Imdur 60 mg once daily.  He uses prosthetic limbs to ambulate.  No chest pain just dyspnea   He had a PET CT on 02/23/22 which was technically challenging due to poor stress tracer uptake but myocardial blood flow was normal in all arteries suggesting low risk study   Her baby sister is [name] who works in our office Needs script for revised LE prosthetic legs at [employer]  Has not smoked in 8 days     Past Medical History:  Diagnosis Date   Anemia    Atherosclerosis of lower extremity ([place])    Blood clot in vein    right calf   CAD (coronary artery disease)    Cataracts, bilateral    Chronic combined systolic and diastolic heart failure ([place])    Complication of anesthesia    Depression    ESRD (end stage renal disease) on dialysis [facility])    "MWF [facility]" (03/08/2017)   GERD (gastroesophageal reflux disease)    Heart murmur    History of blood transfusion    "  related to OR"   Hypertension    Myocardial infarction ([place])     " light"   Nonhealing surgical wound    nonviable tissue   PONV (postoperative nausea and vomiting)    S/P unilateral BKA (below knee amputation), left ([facility])    Type II diabetes mellitus ([place])    Wears glasses    Current Medications: Current Meds  Medication Sig   acetaminophen (TYLENOL) 325 MG tablet Take 2 tablets (650 mg total) by mouth every 4 (four) hours as needed for headache or mild pain.   aspirin EC 81 MG tablet Take 1 tablet (81 mg total) by mouth daily.   atorvastatin (LIPITOR) 80 MG tablet Take 1 tablet (80 mg total) by mouth daily.   Blood Pressure Monitoring (BLOOD PRESSURE MONITOR/S CUFF) MISC Check blood  pressure every day   calcitRIOL (ROCALTROL) 0.25 MCG capsule Take 5 capsules (1.25 mcg total) by mouth every Monday, Wednesday, and Friday with hemodialysis.   calcium carbonate (TUMS - DOSED IN MG ELEMENTAL CALCIUM) 500 MG chewable tablet Chew 500 mg by mouth daily.   clopidogrel (PLAVIX) 75 MG tablet Take 1 tablet (75 mg total) by mouth daily.   Darbepoetin Alfa (ARANESP) 200 MCG/0.4ML SOSY injection Inject 0.4 mLs (200 mcg total) into the vein every Monday with hemodialysis.   insulin aspart (NOVOLOG) 100 UNIT/ML FlexPen Inject 1-6 Units into the skin 3 (three) times daily with meals. CBG 151 - 200: 1 unit, CBG 201-250: 2 units, CBG 251-300: 3 units, CBG 301-350: 4 units, CBG 351-400: 5 units, CBG > 400: Give 6 units   insulin glargine (LANTUS) 100 UNIT/ML Solostar Pen Inject 5 Units into the skin daily.   isosorbide mononitrate (IMDUR) 30 MG 24 hr tablet Take 1 tablet (30 mg total) by mouth daily.   lanthanum (FOSRENOL) 1000 MG chewable tablet Chew 2 tablets (2,000 mg total) by mouth 3 (three) times daily with meals.   methocarbamol (ROBAXIN) 500 MG tablet Take 1,000 mg by mouth 4 (four) times daily as needed for muscle spasms.   metoprolol succinate (TOPROL XL) 25 MG 24 hr tablet Take 1 tablet (25 mg total) by mouth daily.   multivitamin (RENA-VIT) TABS tablet Take 1 tablet by mouth at bedtime.   nitroGLYCERIN (NITROSTAT) 0.4 MG SL tablet Place 1 tablet (0.4 mg total) under the tongue every 5 (five) minutes as needed for chest pain.   pantoprazole (PROTONIX) 40 MG tablet Take 1 tablet (40 mg total) by mouth daily.   pantoprazole (PROTONIX) 40 MG tablet Take 40 mg by mouth daily.    Allergies:   Coconut (cocos nucifera)   Social History   Tobacco Use   Smoking status: Former    Packs/day: 0.50    Years: 26.00    Total pack years: 13.00    Types: Cigarettes    Quit date: 10/25/2018    Years since quitting: 3.3   Smokeless tobacco: Never  Vaping Use   Vaping Use: Never used  Substance  Use Topics   Alcohol use: Not Currently    Comment:  "haven't took a drink in over 5 years"   Drug use: Yes    Types: Marijuana    Comment: occasional    Family Hx: The patient's family history includes Heart failure in his mother; Hypertension in his mother.  Review of Systems  Respiratory:  Positive for cough (clear sputum). Negative for hemoptysis.   Gastrointestinal:  Positive for vomiting (GI consult pending). Negative for hematochezia and melena.  Genitourinary:  Anuric     EKGs/Labs/Other Test Reviewed:    EKG:  SR rate 76 inferior lateral ST changes 12/06/21    Recent Labs: 07/31/2021: B Natriuretic Peptide 289.0 12/06/2021: ALT 21; BUN 41; Creatinine, Ser 11.73; Hemoglobin 9.4; Platelets 284; Potassium 3.4; Sodium 132   Recent Lipid Panel Recent Labs    10/27/21 1310  CHOL 117  TRIG 100  HDL 41  LDLCALC 57  LDLDIRECT 59     Risk Assessment/Calculations:         Physical Exam:    VS:  BP 110/72   Pulse 94   Ht 6\' 2"  (1.88 m)   Wt 187 lb (84.8 kg)   SpO2 98%   BMI 24.01 kg/m     Wt Readings from Last 3 Encounters:  03/05/22 187 lb (84.8 kg)  12/22/21 186 lb 12.8 oz (84.7 kg)  10/27/21 186 lb 12.8 oz (84.7 kg)     Affect appropriate Chronically ill  HEENT: normal Neck supple with no adenopathy JVP normal no bruits no thyromegaly Lungs clear with no wheezing and good diaphragmatic motion Heart:  S1/S2 no murmur, no rub, gallop or click PMI normal post sternotomy  Abdomen: benighn, BS positve, no tenderness, no AAA no bruit.  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  Target hemoglobin A1c is 6.5 or less.  Continue current medications.   Signed, Jenkins Rouge, MD  03/05/2022 8:53 AM    [address], [place], [place]  [address] Phone: [phone]; Fax: [phone]

## 2022-03-05 ENCOUNTER — Encounter: Payer: Self-pay | Admitting: Cardiovascular Disease

## 2022-03-05 ENCOUNTER — Ambulatory Visit (INDEPENDENT_AMBULATORY_CARE_PROVIDER_SITE_OTHER): Payer: Medicaid Other | Admitting: Cardiovascular Disease

## 2022-03-05 VITALS — BP 110/72 | HR 94 | Ht 74.0 in | Wt 187.0 lb

## 2022-03-05 DIAGNOSIS — I10 Essential (primary) hypertension: Secondary | ICD-10-CM

## 2022-03-05 DIAGNOSIS — I739 Peripheral vascular disease, unspecified: Secondary | ICD-10-CM | POA: Diagnosis not present

## 2022-03-05 DIAGNOSIS — N184 Chronic kidney disease, stage 4 (severe): Secondary | ICD-10-CM | POA: Diagnosis not present

## 2022-03-05 DIAGNOSIS — Z720 Tobacco use: Secondary | ICD-10-CM

## 2022-03-05 NOTE — Patient Instructions (Signed)

## 2022-03-12 ENCOUNTER — Encounter (HOSPITAL_COMMUNITY): Payer: Self-pay | Admitting: Emergency Medicine

## 2022-03-12 ENCOUNTER — Other Ambulatory Visit: Payer: Self-pay

## 2022-03-12 ENCOUNTER — Emergency Department (HOSPITAL_COMMUNITY): Payer: Medicaid Other

## 2022-03-12 ENCOUNTER — Inpatient Hospital Stay (HOSPITAL_COMMUNITY)
Admission: EM | Admit: 2022-03-12 | Discharge: 2022-03-15 | DRG: 100 | Disposition: A | Discharge status: Home / Self Care | Payer: Medicaid Other | Source: Ambulatory Visit | Attending: Family Medicine | Admitting: Family Medicine

## 2022-03-12 DIAGNOSIS — Z8249 Family history of ischemic heart disease and other diseases of the circulatory system: Secondary | ICD-10-CM

## 2022-03-12 DIAGNOSIS — I5042 Chronic combined systolic (congestive) and diastolic (congestive) heart failure: Secondary | ICD-10-CM | POA: Diagnosis present

## 2022-03-12 DIAGNOSIS — F32A Depression, unspecified: Secondary | ICD-10-CM | POA: Diagnosis present

## 2022-03-12 DIAGNOSIS — Z992 Dependence on renal dialysis: Secondary | ICD-10-CM

## 2022-03-12 DIAGNOSIS — Z79899 Other long term (current) drug therapy: Secondary | ICD-10-CM

## 2022-03-12 DIAGNOSIS — E1065 Type 1 diabetes mellitus with hyperglycemia: Secondary | ICD-10-CM | POA: Diagnosis present

## 2022-03-12 DIAGNOSIS — E1051 Type 1 diabetes mellitus with diabetic peripheral angiopathy without gangrene: Secondary | ICD-10-CM | POA: Diagnosis present

## 2022-03-12 DIAGNOSIS — D631 Anemia in chronic kidney disease: Secondary | ICD-10-CM | POA: Diagnosis present

## 2022-03-12 DIAGNOSIS — Z91199 Patient's noncompliance with other medical treatment and regimen due to unspecified reason: Secondary | ICD-10-CM

## 2022-03-12 DIAGNOSIS — E8770 Fluid overload, unspecified: Secondary | ICD-10-CM | POA: Diagnosis present

## 2022-03-12 DIAGNOSIS — R569 Unspecified convulsions: Principal | ICD-10-CM | POA: Diagnosis present

## 2022-03-12 DIAGNOSIS — F1210 Cannabis abuse, uncomplicated: Secondary | ICD-10-CM | POA: Diagnosis present

## 2022-03-12 DIAGNOSIS — R1110 Vomiting, unspecified: Secondary | ICD-10-CM | POA: Diagnosis present

## 2022-03-12 DIAGNOSIS — I959 Hypotension, unspecified: Secondary | ICD-10-CM | POA: Diagnosis not present

## 2022-03-12 DIAGNOSIS — F5104 Psychophysiologic insomnia: Secondary | ICD-10-CM | POA: Diagnosis present

## 2022-03-12 DIAGNOSIS — R4689 Other symptoms and signs involving appearance and behavior: Secondary | ICD-10-CM

## 2022-03-12 DIAGNOSIS — G4700 Insomnia, unspecified: Secondary | ICD-10-CM | POA: Diagnosis present

## 2022-03-12 DIAGNOSIS — Z89511 Acquired absence of right leg below knee: Secondary | ICD-10-CM

## 2022-03-12 DIAGNOSIS — Z89512 Acquired absence of left leg below knee: Secondary | ICD-10-CM

## 2022-03-12 DIAGNOSIS — I132 Hypertensive heart and chronic kidney disease with heart failure and with stage 5 chronic kidney disease, or end stage renal disease: Secondary | ICD-10-CM | POA: Diagnosis present

## 2022-03-12 DIAGNOSIS — Z951 Presence of aortocoronary bypass graft: Secondary | ICD-10-CM

## 2022-03-12 DIAGNOSIS — I252 Old myocardial infarction: Secondary | ICD-10-CM

## 2022-03-12 DIAGNOSIS — E1029 Type 1 diabetes mellitus with other diabetic kidney complication: Secondary | ICD-10-CM | POA: Diagnosis present

## 2022-03-12 DIAGNOSIS — N2581 Secondary hyperparathyroidism of renal origin: Secondary | ICD-10-CM | POA: Diagnosis present

## 2022-03-12 DIAGNOSIS — N186 End stage renal disease: Secondary | ICD-10-CM | POA: Diagnosis present

## 2022-03-12 DIAGNOSIS — Z7902 Long term (current) use of antithrombotics/antiplatelets: Secondary | ICD-10-CM

## 2022-03-12 DIAGNOSIS — E878 Other disorders of electrolyte and fluid balance, not elsewhere classified: Secondary | ICD-10-CM | POA: Diagnosis present

## 2022-03-12 DIAGNOSIS — Z87891 Personal history of nicotine dependence: Secondary | ICD-10-CM

## 2022-03-12 DIAGNOSIS — E1022 Type 1 diabetes mellitus with diabetic chronic kidney disease: Secondary | ICD-10-CM | POA: Diagnosis present

## 2022-03-12 DIAGNOSIS — I2510 Atherosclerotic heart disease of native coronary artery without angina pectoris: Secondary | ICD-10-CM | POA: Diagnosis present

## 2022-03-12 DIAGNOSIS — R051 Acute cough: Secondary | ICD-10-CM

## 2022-03-12 DIAGNOSIS — Z794 Long term (current) use of insulin: Secondary | ICD-10-CM

## 2022-03-12 DIAGNOSIS — E785 Hyperlipidemia, unspecified: Secondary | ICD-10-CM | POA: Diagnosis present

## 2022-03-12 DIAGNOSIS — K219 Gastro-esophageal reflux disease without esophagitis: Secondary | ICD-10-CM | POA: Diagnosis present

## 2022-03-12 DIAGNOSIS — Z9109 Other allergy status, other than to drugs and biological substances: Secondary | ICD-10-CM

## 2022-03-12 DIAGNOSIS — I10 Essential (primary) hypertension: Secondary | ICD-10-CM | POA: Diagnosis present

## 2022-03-12 LAB — COMPREHENSIVE METABOLIC PANEL
ALT: 21 U/L (ref 0–44)
AST: 30 U/L (ref 15–41)
Albumin: 3.2 g/dL — ABNORMAL LOW (ref 3.5–5.0)
Alkaline Phosphatase: 127 U/L — ABNORMAL HIGH (ref 38–126)
Anion gap: 16 — ABNORMAL HIGH (ref 5–15)
BUN: 45 mg/dL — ABNORMAL HIGH (ref 6–20)
CO2: 22 mmol/L (ref 22–32)
Calcium: 8.2 mg/dL — ABNORMAL LOW (ref 8.9–10.3)
Chloride: 93 mmol/L — ABNORMAL LOW (ref 98–111)
Creatinine, Ser: 9.55 mg/dL — ABNORMAL HIGH (ref 0.61–1.24)
GFR, Estimated: 6 mL/min — ABNORMAL LOW (ref >60)
Glucose, Bld: 159 mg/dL — ABNORMAL HIGH (ref 70–99)
Potassium: 3.6 mmol/L (ref 3.5–5.1)
Sodium: 131 mmol/L — ABNORMAL LOW (ref 135–145)
Total Bilirubin: 1.5 mg/dL — ABNORMAL HIGH (ref 0.3–1.2)
Total Protein: 7.5 g/dL (ref 6.5–8.1)

## 2022-03-12 LAB — HEPATITIS B SURFACE ANTIGEN: Hepatitis B Surface Ag: NONREACTIVE

## 2022-03-12 LAB — ETHANOL: Alcohol, Ethyl (B): <10 mg/dL (ref <10)

## 2022-03-12 LAB — CBC WITH DIFFERENTIAL/PLATELET
Abs Immature Granulocytes: 0.01 10*3/uL (ref 0.00–0.07)
Basophils Absolute: 0 10*3/uL (ref 0.0–0.1)
Basophils Relative: 1 %
Eosinophils Absolute: 0.1 10*3/uL (ref 0.0–0.5)
Eosinophils Relative: 2 %
HCT: 40.6 % (ref 39.0–52.0)
Hemoglobin: 13.7 g/dL (ref 13.0–17.0)
Immature Granulocytes: 0 %
Lymphocytes Relative: 37 %
Lymphs Abs: 1.8 10*3/uL (ref 0.7–4.0)
MCH: 27.6 pg (ref 26.0–34.0)
MCHC: 33.7 g/dL (ref 30.0–36.0)
MCV: 81.7 fL (ref 80.0–100.0)
Monocytes Absolute: 0.4 10*3/uL (ref 0.1–1.0)
Monocytes Relative: 9 %
Neutro Abs: 2.5 10*3/uL (ref 1.7–7.7)
Neutrophils Relative %: 51 %
Platelets: 122 10*3/uL — ABNORMAL LOW (ref 150–400)
RBC: 4.97 MIL/uL (ref 4.22–5.81)
RDW: 20.3 % — ABNORMAL HIGH (ref 11.5–15.5)
WBC: 4.8 10*3/uL (ref 4.0–10.5)
nRBC: 0.6 % — ABNORMAL HIGH (ref 0.0–0.2)

## 2022-03-12 LAB — HEPATITIS B CORE ANTIBODY, TOTAL: Hep B Core Total Ab: NONREACTIVE

## 2022-03-12 LAB — HEPATITIS B SURFACE ANTIBODY,QUALITATIVE: Hep B S Ab: REACTIVE — AB

## 2022-03-12 LAB — HEPATITIS C ANTIBODY: HCV Ab: NONREACTIVE

## 2022-03-12 MED ORDER — ASPIRIN 81 MG PO TBEC
81.0000 mg | DELAYED_RELEASE_TABLET | Freq: Every day | ORAL | Status: DC
  Administered 2022-03-12 – 2022-03-15 (×4): 81 mg via ORAL
  Filled 2022-03-12 (×4): qty 1

## 2022-03-12 MED ORDER — CALCITRIOL 0.25 MCG PO CAPS: 1.2500 ug | ORAL_CAPSULE | ORAL | Status: DC

## 2022-03-12 MED ORDER — PANTOPRAZOLE SODIUM 40 MG PO TBEC: 40.0000 mg | DELAYED_RELEASE_TABLET | Freq: Every day | ORAL | Status: DC

## 2022-03-12 MED ORDER — CLOPIDOGREL BISULFATE 75 MG PO TABS
75.0000 mg | ORAL_TABLET | Freq: Every day | ORAL | Status: DC
  Administered 2022-03-12 – 2022-03-15 (×4): 75 mg via ORAL
  Filled 2022-03-12 (×3): qty 1

## 2022-03-12 MED ORDER — ONDANSETRON HCL 4 MG/2ML IJ SOLN: 4.0000 mg | Freq: Four times a day (QID) | INTRAMUSCULAR | Status: DC | PRN

## 2022-03-12 MED ORDER — HEPARIN SODIUM (PORCINE) 5000 UNIT/ML IJ SOLN
5000.0000 [IU] | Freq: Three times a day (TID) | INTRAMUSCULAR | Status: DC
Start: 2022-03-12 — End: 2022-03-15
  Administered 2022-03-13 (×2): 5000 [IU] via SUBCUTANEOUS
  Filled 2022-03-12 (×2): qty 1

## 2022-03-12 MED ORDER — ACETAMINOPHEN 325 MG PO TABS
650.0000 mg | ORAL_TABLET | Freq: Four times a day (QID) | ORAL | Status: DC | PRN
  Administered 2022-03-13: 650 mg via ORAL
  Filled 2022-03-12: qty 2

## 2022-03-12 MED ORDER — NEPRO/CARBSTEADY PO LIQD
237.0000 mL | Freq: Three times a day (TID) | ORAL | Status: DC | PRN
  Filled 2022-03-12: qty 237

## 2022-03-12 MED ORDER — LANTHANUM CARBONATE 500 MG PO CHEW
2000.0000 mg | CHEWABLE_TABLET | Freq: Three times a day (TID) | ORAL | Status: DC
  Administered 2022-03-13 – 2022-03-15 (×4): 2000 mg via ORAL
  Filled 2022-03-12 (×9): qty 4

## 2022-03-12 MED ORDER — LORATADINE 10 MG PO TABS
10.0000 mg | ORAL_TABLET | Freq: Every day | ORAL | Status: DC
  Administered 2022-03-12 – 2022-03-15 (×4): 10 mg via ORAL
  Filled 2022-03-12 (×4): qty 1

## 2022-03-12 MED ORDER — ALBUTEROL SULFATE (2.5 MG/3ML) 0.083% IN NEBU: 2.5000 mg | INHALATION_SOLUTION | RESPIRATORY_TRACT | Status: DC | PRN

## 2022-03-12 MED ORDER — PANTOPRAZOLE SODIUM 40 MG PO TBEC
40.0000 mg | DELAYED_RELEASE_TABLET | Freq: Every day | ORAL | Status: DC
  Administered 2022-03-12 – 2022-03-14 (×3): 40 mg via ORAL
  Filled 2022-03-12 (×3): qty 1

## 2022-03-12 MED ORDER — METOPROLOL SUCCINATE ER 25 MG PO TB24
25.0000 mg | ORAL_TABLET | Freq: Every day | ORAL | Status: DC
  Administered 2022-03-12 – 2022-03-13 (×2): 25 mg via ORAL
  Filled 2022-03-12 (×2): qty 1

## 2022-03-12 MED ORDER — INSULIN GLARGINE-YFGN 100 UNIT/ML ~~LOC~~ SOLN
5.0000 [IU] | Freq: Every day | SUBCUTANEOUS | Status: DC
  Administered 2022-03-13 – 2022-03-15 (×2): 5 [IU] via SUBCUTANEOUS
  Filled 2022-03-12 (×3): qty 0.05

## 2022-03-12 MED ORDER — SODIUM CHLORIDE 0.9% FLUSH
3.0000 mL | Freq: Two times a day (BID) | INTRAVENOUS | Status: DC
  Administered 2022-03-12 – 2022-03-15 (×4): 3 mL via INTRAVENOUS

## 2022-03-12 MED ORDER — LORAZEPAM 2 MG/ML IJ SOLN: 4.0000 mg | INTRAMUSCULAR | Status: DC | PRN

## 2022-03-12 MED ORDER — CAMPHOR-MENTHOL 0.5-0.5 % EX LOTN
1.0000 | TOPICAL_LOTION | Freq: Three times a day (TID) | CUTANEOUS | Status: DC | PRN
  Filled 2022-03-12: qty 222

## 2022-03-12 MED ORDER — ISOSORBIDE MONONITRATE ER 30 MG PO TB24
30.0000 mg | ORAL_TABLET | Freq: Every day | ORAL | Status: DC
  Administered 2022-03-12 – 2022-03-13 (×2): 30 mg via ORAL
  Filled 2022-03-12 (×2): qty 1

## 2022-03-12 MED ORDER — ONDANSETRON HCL 4 MG PO TABS: 4.0000 mg | ORAL_TABLET | Freq: Four times a day (QID) | ORAL | Status: DC | PRN

## 2022-03-12 MED ORDER — SORBITOL 70 % SOLN: 30.0000 mL | Status: DC | PRN

## 2022-03-12 MED ORDER — INSULIN ASPART 100 UNIT/ML IJ SOLN
0.0000 [IU] | Freq: Three times a day (TID) | INTRAMUSCULAR | Status: DC
  Administered 2022-03-13: 2 [IU] via SUBCUTANEOUS
  Administered 2022-03-13: 3 [IU] via SUBCUTANEOUS
  Administered 2022-03-14: 2 [IU] via SUBCUTANEOUS

## 2022-03-12 MED ORDER — DOCUSATE SODIUM 283 MG RE ENEM
1.0000 | ENEMA | RECTAL | Status: DC | PRN
  Filled 2022-03-12: qty 1

## 2022-03-12 MED ORDER — ZOLPIDEM TARTRATE 5 MG PO TABS: 5.0000 mg | ORAL_TABLET | Freq: Every evening | ORAL | Status: DC | PRN

## 2022-03-12 MED ORDER — ACETAMINOPHEN 650 MG RE SUPP: 650.0000 mg | Freq: Four times a day (QID) | RECTAL | Status: DC | PRN

## 2022-03-12 MED ORDER — ATORVASTATIN CALCIUM 80 MG PO TABS
80.0000 mg | ORAL_TABLET | Freq: Every day | ORAL | Status: DC
  Administered 2022-03-12 – 2022-03-15 (×4): 80 mg via ORAL
  Filled 2022-03-12: qty 1
  Filled 2022-03-12: qty 2
  Filled 2022-03-12: qty 1
  Filled 2022-03-12: qty 2

## 2022-03-12 MED ORDER — CALCIUM CARBONATE ANTACID 1250 MG/5ML PO SUSP
500.0000 mg | Freq: Four times a day (QID) | ORAL | Status: DC | PRN
  Filled 2022-03-12: qty 5

## 2022-03-12 MED ORDER — HYDRALAZINE HCL 20 MG/ML IJ SOLN: 5.0000 mg | INTRAMUSCULAR | Status: DC | PRN

## 2022-03-12 MED ORDER — RENA-VITE PO TABS
1.0000 | ORAL_TABLET | Freq: Every day | ORAL | Status: DC
  Administered 2022-03-13: 1 via ORAL
  Filled 2022-03-12 (×2): qty 1

## 2022-03-12 MED ORDER — HYDROMORPHONE HCL 1 MG/ML IJ SOLN
1.0000 mg | Freq: Once | INTRAMUSCULAR | Status: AC | PRN
  Administered 2022-03-13: 1 mg via INTRAVENOUS
  Filled 2022-03-12: qty 1

## 2022-03-12 MED ORDER — CHLORHEXIDINE GLUCONATE CLOTH 2 % EX PADS: 6.0000 | MEDICATED_PAD | Freq: Every day | CUTANEOUS | Status: DC

## 2022-03-12 MED ORDER — METHOCARBAMOL 500 MG PO TABS
1000.0000 mg | ORAL_TABLET | Freq: Four times a day (QID) | ORAL | Status: DC | PRN
  Administered 2022-03-12 – 2022-03-13 (×2): 1000 mg via ORAL
  Filled 2022-03-12 (×2): qty 2

## 2022-03-12 MED ORDER — HYDROXYZINE HCL 25 MG PO TABS: 25.0000 mg | ORAL_TABLET | Freq: Three times a day (TID) | ORAL | Status: DC | PRN

## 2022-03-12 NOTE — ED Triage Notes (Signed)
Pt arrived via [facility] from dialysis with c/c of seizure. Per EMS pt finished 1 hour of dialysis. Dialysis RN stated he had a 10 minute seizure. Pt arrived complaining of phantom pain.   142/82, 73HR, 97% RA, 174 CBG

## 2022-03-12 NOTE — ED Provider Notes (Signed)
[facility] EMERGENCY DEPARTMENT Provider Note   CSN: [id number] Arrival date & time: 03/12/22  1216     History  Chief Complaint  Patient presents with   Seizures    [name] Brandel Sr. is a 46 y.o. male.  HPI Patient presents [place] for evaluation of a seizure which occurred after about an hour of dialysis today.  On presentation he still has his vascular access to being in the fistula of his left upper arm.  He reports coughing recently and producing sputum after stopping smoking 12 days ago.  He states he is never had a seizure.  He denies use of alcohol.  He denies fever, chills or dizziness.  He states he has pain in his bilateral hamstrings.  He has bilateral lower leg amputations.  He typically walks with prostheses.    Home Medications Prior to Admission medications   Medication Sig Start Date End Date Taking? Authorizing Provider  acetaminophen (TYLENOL) 325 MG tablet Take 2 tablets (650 mg total) by mouth every 4 (four) hours as needed for headache or mild pain. 09/08/18   Geradine Girt, DO  aspirin EC 81 MG tablet Take 1 tablet (81 mg total) by mouth daily. 02/07/18   Burtis Junes, NP  atorvastatin (LIPITOR) 80 MG tablet Take 1 tablet (80 mg total) by mouth daily. 10/27/21   Elgie Collard, PA-C  Blood Pressure Monitoring (BLOOD PRESSURE MONITOR/S CUFF) MISC Check blood pressure every day 10/27/21   Elgie Collard, PA-C  calcitRIOL (ROCALTROL) 0.25 MCG capsule Take 5 capsules (1.25 mcg total) by mouth every Monday, Wednesday, and Friday with hemodialysis. 11/09/17   Elgie Collard, PA-C  calcium carbonate (TUMS - DOSED IN MG ELEMENTAL CALCIUM) 500 MG chewable tablet Chew 500 mg by mouth daily. 04/25/20   [provider]  clopidogrel (PLAVIX) 75 MG tablet Take 1 tablet (75 mg total) by mouth daily. 10/27/21   Elgie Collard, PA-C  Darbepoetin Alfa (ARANESP) 200 MCG/0.4ML SOSY injection Inject 0.4 mLs (200 mcg total) into the vein every Monday  with hemodialysis. 12/04/18   Angiulli, Lavon Paganini, PA-C  insulin aspart (NOVOLOG) 100 UNIT/ML FlexPen Inject 1-6 Units into the skin 3 (three) times daily with meals. CBG 151 - 200: 1 unit, CBG 201-250: 2 units, CBG 251-300: 3 units, CBG 301-350: 4 units, CBG 351-400: 5 units, CBG > 400: Give 6 units 08/04/21   Dwyane Dee, MD  insulin glargine (LANTUS) 100 UNIT/ML Solostar Pen Inject 5 Units into the skin daily. 08/04/21   Dwyane Dee, MD  isosorbide mononitrate (IMDUR) 30 MG 24 hr tablet Take 1 tablet (30 mg total) by mouth daily. 12/22/21   Richardson Dopp T, PA-C  lanthanum (FOSRENOL) 1000 MG chewable tablet Chew 2 tablets (2,000 mg total) by mouth 3 (three) times daily with meals. 11/28/18   Angiulli, Lavon Paganini, PA-C  methocarbamol (ROBAXIN) 500 MG tablet Take 1,000 mg by mouth 4 (four) times daily as needed for muscle spasms. 06/17/20   [provider]  metoprolol succinate (TOPROL XL) 25 MG 24 hr tablet Take 1 tablet (25 mg total) by mouth daily. 12/22/21   Richardson Dopp T, PA-C  multivitamin (RENA-VIT) TABS tablet Take 1 tablet by mouth at bedtime. 08/17/18   Kayleen Memos, DO  nitroGLYCERIN (NITROSTAT) 0.4 MG SL tablet Place 1 tablet (0.4 mg total) under the tongue every 5 (five) minutes as needed for chest pain. 10/27/21   Elgie Collard, PA-C  pantoprazole (PROTONIX) 40 MG  tablet Take 1 tablet (40 mg total) by mouth daily. 09/04/21 09/04/22  Dwyane Dee, MD  pantoprazole (PROTONIX) 40 MG tablet Take 40 mg by mouth daily.    [provider]      Allergies    Coconut (cocos nucifera)    Review of Systems   Review of Systems  Physical Exam Updated Vital Signs BP 94/81   Pulse 68   Temp 98.3 F (36.8 C) (Oral)   Resp 18   Ht 6\' 2"  (1.88 m)   Wt 84.8 kg   SpO2 95%   BMI 24.01 kg/m  Physical Exam Vitals and nursing note reviewed.  Constitutional:      General: He is not in acute distress.    Appearance: He is well-developed. He is not ill-appearing or diaphoretic.   HENT:     Head: Normocephalic and atraumatic.     Right Ear: External ear normal.     Left Ear: External ear normal.  Eyes:     Conjunctiva/sclera: Conjunctivae normal.     Pupils: Pupils are equal, round, and reactive to light.  Neck:     Trachea: Phonation normal.  Cardiovascular:     Rate and Rhythm: Normal rate and regular rhythm.     Heart sounds: Normal heart sounds.  Pulmonary:     Effort: Pulmonary effort is normal.     Breath sounds: Normal breath sounds.  Abdominal:     General: There is no distension.     Palpations: Abdomen is soft.     Tenderness: There is no abdominal tenderness.  Musculoskeletal:        General: No swelling. Normal range of motion.     Cervical back: Normal range of motion and neck supple.  Skin:    General: Skin is warm and dry.  Neurological:     Mental Status: He is alert and oriented to person, place, and time.     Cranial Nerves: No cranial nerve deficit.     Sensory: No sensory deficit.     Motor: No abnormal muscle tone.     Coordination: Coordination normal.  Psychiatric:        Mood and Affect: Mood normal.        Behavior: Behavior normal.        Thought Content: Thought content normal.        Judgment: Judgment normal.     ED Results / Procedures / Treatments   Labs (all labs ordered are listed, but only abnormal results are displayed) Labs Reviewed  COMPREHENSIVE METABOLIC PANEL - Abnormal; Notable for the following components:      Result Value   Sodium 131 (*)    Chloride 93 (*)    Glucose, Bld 159 (*)    BUN 45 (*)    Creatinine, Ser 9.55 (*)    Calcium 8.2 (*)    Albumin 3.2 (*)    Alkaline Phosphatase 127 (*)    Total Bilirubin 1.5 (*)    GFR, Estimated 6 (*)    Anion gap 16 (*)    All other components within normal limits  CBC WITH DIFFERENTIAL/PLATELET - Abnormal; Notable for the following components:   RDW 20.3 (*)    Platelets 122 (*)    nRBC 0.6 (*)    All other components within normal limits   ETHANOL  DRUG SCREEN 10 W/CONF, SERUM  HEPATITIS B SURFACE ANTIGEN  HEPATITIS B SURFACE ANTIBODY,QUALITATIVE  HEPATITIS B SURFACE ANTIBODY, QUANTITATIVE  HEPATITIS B CORE ANTIBODY, TOTAL  HEPATITIS C ANTIBODY    EKG EKG Interpretation  Date/Time:  Friday March 12 2022 12:21:58 EDT Ventricular Rate:  72 PR Interval:  167 QRS Duration: 110 QT Interval:  473 QTC Calculation: 518 R Axis:   5 Text Interpretation: Sinus rhythm Probable anteroseptal infarct, old Abnrm T, consider ischemia, anterolateral lds Prolonged QT interval since last tracing no significant change Confirmed by Daleen Bo [phone] on 03/12/2022 12:26:06 PM  Radiology CT Head Wo Contrast  Result Date: 03/12/2022 CLINICAL DATA:  Dialysis patient, concern for seizure activity EXAM: CT HEAD WITHOUT CONTRAST TECHNIQUE: Contiguous axial images were obtained from the base of the skull through the vertex without intravenous contrast. RADIATION DOSE REDUCTION: This exam was performed according to the departmental dose-optimization program which includes automated exposure control, adjustment of the mA and/or kV according to patient size and/or use of iterative reconstruction technique. COMPARISON:  02/22/2006 FINDINGS: Brain: Limited with motion artifact. No definite acute intracranial hemorrhage, mass lesion, new infarction, midline shift, herniation, or hydrocephalus. No extra-axial fluid collection. No focal mass effect or edema. Cisterns are patent. No cerebellar abnormality. Vascular: No hyperdense vessel or unexpected calcification. Skull: Also limited with motion. No acute abnormality. Mastoids are clear. Sinuses/Orbits: Scattered chronic sinus mucosal thickening worse in the right maxillary sinus. No sinus air-fluid level. Orbits unremarkable. Other: None. IMPRESSION: Limited with motion. No significant acute intracranial abnormality by noncontrast CT. Electronically Signed   By: Jerilynn Mages.  Shick M.D.   On: 03/12/2022 13:40   DG  Chest 2 View  Result Date: 03/12/2022 CLINICAL DATA:  Cough post seizures today. EXAM: CHEST - 2 VIEW COMPARISON:  Radiographs 12/06/2021.  CT 07/17/2009. FINDINGS: Suboptimal inspiration, especially on the lateral view. 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 There is vascular congestion with small pleural effusions, mild edema and probable left lower lobe atelectasis. No pneumothorax. The bones appear unremarkable. Telemetry leads overlie the chest. IMPRESSION: Similar radiographic appearance of the chest with cardiomegaly, edema and bilateral pleural effusions suspicious for congestive heart failure. Electronically Signed   By: Richardean Sale M.D.   On: 03/12/2022 13:36    Procedures Procedures    Medications Ordered in ED Medications  Chlorhexidine Gluconate Cloth 2 % PADS 6 each (has no administration in time range)    ED Course/ Medical Decision Making/ A&P Clinical Course as of 03/12/22 1651  Fri Mar 12, 2022  1609 No seizures in the ED.  Currently he is complaining of hunger. [EW]  1616 Drug Screen 10 W/Conf, Serum [EW]    Clinical Course User Index [EW] Daleen Bo, MD                           Medical Decision Making Patient presenting after having a seizure during his dialysis treatment today.  Treatment was discontinued after about an hour.  He has never had a seizure previously.  Patient is complaining of a cough productive of sputum.  Problems Addressed: Acute cough: complicated acute illness or injury Seizure [facility]): acute illness or injury that poses a threat to life or bodily functions  Amount and/or Complexity of Data Reviewed Labs: ordered.    Details: CBC, metabolic panel-normal except platelets low, anion gap elevated, BUN high, creatinine high, alkaline phosphatase low, total bilirubin high, GFR low, calcium low, sodium low, chloride low, glucose high Radiology: ordered and independent interpretation performed.    Details: Chest x-ray --  consistent with fluid overload/pulmonary edema.  CT head no acute intracranial abnormality. Discussion of  management or test interpretation with external provider(s): Case discussed with hospitalist to arrange admission.  Case discussed with nephrologist to arrange for evaluation for possible dialysis and ongoing management if he requires prolonged hospitalization.  Risk Decision regarding hospitalization. Risk Details: Patient presenting for evaluation of seizure while at dialysis.  He has not previously had a seizure disorder.  While in the ED patient was found with white powder in a baggy, which staff felt was an illegal substances such as cocaine or heroin.  This was taken from the patient and given to the charge nurse.  Drug screen was ordered.  Case discussed with hospitalist arrange for admission.  Anticipate seizure work-up.  Nephrology is on board for dialysis as needed.           Final Clinical Impression(s) / ED Diagnoses Final diagnoses:  Seizure ([place])  Acute cough    Rx / DC Orders ED Discharge Orders     None         Daleen Bo, MD 03/12/22 1651

## 2022-03-12 NOTE — H&P (Signed)
History and Physical    Patient: [name] DOB: [birth year] DOA: 03/12/2022 DOS: the patient was seen and examined on 03/12/2022 PCP: [name] Mariscal, MD  Patient coming from: Home - lives with cousin; [place]: Cousin   Chief Complaint: Seizure  HPI: [name]. is a 46 y.o. male with medical history significant of CAD; chronic combined CHF; ESRD on HD; HTN; DM; and PAD s/p B BKA presenting with a seizure during HD.  He reports that he has been feeling somewhat ill throughout the week since he hasn't slept in 4 days, has difficulty with insomnia.  He felt ok at HD and fell asleep and when he awoke he was surrounded by people and was transported to the ER.   He does not think he had a seizure.  He knew where he was when he awoke.  No prior h/o seizures.  He acknowledges regular use of marijuana but denies use of other drugs.    ER Course:  Seizure during HD.  Alert on arrival. Fluid overload on CXR.  Nephrology will consult.  Head CT ok.  No concern for meningitis.  Possible ingestion.  Serum drug screen ordered.     Review of Systems: As mentioned in the history of present illness. All other systems reviewed and are negative. Past Medical History:  Diagnosis Date   Anemia    Atherosclerosis of lower extremity (HCC)    Blood clot in vein    right calf   CAD (coronary artery disease)    Cataracts, bilateral    Chronic combined systolic and diastolic heart failure (HCC)    Complication of anesthesia    Depression    ESRD (end stage renal disease) on dialysis ([place])    "MWF [place]" (03/08/2017)   GERD (gastroesophageal reflux disease)    Heart murmur    History of blood transfusion    "related to OR"   Hypertension    Myocardial infarction ([place])     " light"   Nonhealing surgical wound    nonviable tissue   PONV (postoperative nausea and vomiting)    S/P unilateral BKA (below knee amputation), left ([place])    Type II diabetes mellitus ([place])     Wears glasses    Past Surgical History:  Procedure Laterality Date   ABDOMINAL AORTOGRAM N/A 11/02/2016   Procedure: Abdominal Aortogram;  Surgeon: Waynetta Sandy, MD;  Location: [facility];  Service: Cardiovascular;  Laterality: N/A;   ABDOMINAL AORTOGRAM W/LOWER EXTREMITY Right 08/07/2018   Procedure: ABDOMINAL AORTOGRAM W/LOWER EXTREMITY;  Surgeon: [name], MD;  Location: [facility];  Service: Cardiovascular;  Laterality: Right;   AMPUTATION Left 09/27/2013   Procedure: LEFT GREAT TOE AMPUTATION;  Surgeon: [name], MD;  Location: [facility];  Service: Orthopedics;  Laterality: Left;   AMPUTATION Right 08/15/2015   Procedure: Right Great Toe Amputation;  Surgeon: [name], MD;  Location: [facility];  Service: Orthopedics;  Laterality: Right;   AMPUTATION Left 11/05/2016   Procedure: TRANSMETATARSAL AMPUTATION LEFT FOOT;  Surgeon: [name], MD;  Location: [facility];  Service: Orthopedics;  Laterality: Left;   AMPUTATION Left 03/11/2017   Procedure: LEFT BELOW KNEE AMPUTATION;  Surgeon: [name], MD;  Location: [facility] [facility];  Service: Orthopedics;  Laterality: Left;   AMPUTATION Right 03/11/2017   Procedure: RIGHT 2ND TOE AMPUTATION;  Surgeon: [name], MD;  Location: [facility];  Service: Orthopedics;  Laterality: Right;  AMPUTATION Right 11/17/2018   Procedure: AMPUTATION BELOW KNEE;  Surgeon: [name], MD;  Location: [facility];  Service: Vascular;  Laterality: Right;   AV FISTULA PLACEMENT  left arm   CORONARY ARTERY BYPASS GRAFT N/A 11/04/2017   Procedure: CORONARY ARTERY BYPASS GRAFTING (CABG) x three, using left internal mammary artery and right    leg greater saphenous vein;  Surgeon: Melrose Nakayama, MD;  Location: [facility];  Service: Open Heart Surgery;  Laterality: N/A;   ESOPHAGOGASTRODUODENOSCOPY (EGD) WITH PROPOFOL N/A 08/02/2021   Procedure: ESOPHAGOGASTRODUODENOSCOPY (EGD) WITH PROPOFOL;  Surgeon: Yetta Flock, MD;   Location: [facility];  Service: Gastroenterology;  Laterality: N/A;   FASCIOTOMY Right 08/07/2018   Procedure: FOUR COMPARTMENT FASCIOTOMY OF RIGHT LOWER LEG;  Surgeon: Rosetta Posner, MD;  Location: [place];  Service: Vascular;  Laterality: Right;   FASCIOTOMY CLOSURE Right 08/10/2018   Procedure: FASCIOTOMY CLOSURE RIGHT LOWER EXTREMITY;  Surgeon: [name], MD;  Location: [facility];  Service: Vascular;  Laterality: Right;   HEMATOMA EVACUATION Right 08/07/2018   Procedure: EVACUATION HEMATOMA;  Surgeon: Rosetta Posner, MD;  Location: [facility];  Service: Vascular;  Laterality: Right;   HEMOSTASIS CLIP PLACEMENT  08/02/2021   Procedure: HEMOSTASIS CLIP PLACEMENT;  Surgeon: Yetta Flock, MD;  Location: [facility];  Service: Gastroenterology;;   HOT HEMOSTASIS N/A 08/02/2021   Procedure: HOT HEMOSTASIS (ARGON PLASMA COAGULATION/BICAP);  Surgeon: Yetta Flock, MD;  Location: [facility];  Service: Gastroenterology;  Laterality: N/A;   LEFT HEART CATH AND CORONARY ANGIOGRAPHY N/A 10/31/2017   Procedure: LEFT HEART CATH AND CORONARY ANGIOGRAPHY;  Surgeon: Troy Sine, MD;  Location: [facility];  Service: Cardiovascular;  Laterality: N/A;   LEFT HEART CATHETERIZATION WITH CORONARY ANGIOGRAM N/A 09/13/2014   Procedure: LEFT HEART CATHETERIZATION WITH CORONARY ANGIOGRAM;  Surgeon: Sinclair Grooms, MD;  Location: [facility];  Service: Cardiovascular;  Laterality: N/A;   LOWER EXTREMITY ANGIOGRAPHY Bilateral 11/02/2016   Procedure: Lower Extremity Angiography;  Surgeon: Waynetta Sandy, MD;  Location: [place] [facility];  Service: Cardiovascular;  Laterality: Bilateral;   PERIPHERAL VASCULAR ATHERECTOMY Left 11/02/2016   Procedure: Peripheral Vascular Atherectomy;  Surgeon: Waynetta Sandy, MD;  Location: [facility];  Service: Cardiovascular;  Laterality: Left;  PERONEAL   PERIPHERAL VASCULAR ATHERECTOMY Right 08/07/2018   Procedure: PERIPHERAL  VASCULAR ATHERECTOMY;  Surgeon: [name], MD;  Location: [place] [facility];  Service: Cardiovascular;  Laterality: Right;  Peroneal artery   PERIPHERAL VASCULAR BALLOON ANGIOPLASTY Right 08/07/2018   Procedure: PERIPHERAL VASCULAR BALLOON ANGIOPLASTY;  Surgeon: [name], MD;  Location: [facility] [facility];  Service: Cardiovascular;  Laterality: Right;  anterior tibial artery   STUMP REVISION Left 01/11/2017   Procedure: Revision Left Transmetatarsal Amputation;  Surgeon: [name], MD;  Location: [facility];  Service: Orthopedics;  Laterality: Left;   TEE WITHOUT CARDIOVERSION N/A 11/04/2017   Procedure: TRANSESOPHAGEAL ECHOCARDIOGRAM (TEE);  Surgeon: Melrose Nakayama, MD;  Location: [place];  Service: Open Heart Surgery;  Laterality: N/A;   TRANSMETATARSAL AMPUTATION Right 08/10/2018   Procedure: RIGHT FIFTH TOE AMPUTATION;  Surgeon: [name], MD;  Location: [place];  Service: Vascular;  Laterality: Right;   TRANSMETATARSAL AMPUTATION Right 09/14/2018   Procedure: TRANSMETATARSAL AMPUTATION;  Surgeon: [name], MD;  Location: [place];  Service: Vascular;  Laterality: Right;   Social History:  reports that he quit smoking about 3 years ago. His  smoking use included cigarettes. He has a 13.00 pack-year smoking history. He has never used smokeless tobacco. He reports that he does not currently use alcohol. He reports current drug use. Drug: Marijuana.  Allergies  Allergen Reactions   Coconut (Cocos Nucifera) Anaphylaxis and Other (See Comments)    Can use topically, allergic to coconut foods     Family History  Problem Relation Age of Onset   Heart failure Mother    Hypertension Mother     Prior to Admission medications   Medication Sig Start Date End Date Taking? Authorizing Provider  acetaminophen (TYLENOL) 325 MG tablet Take 2 tablets (650 mg total) by mouth every 4 (four) hours as needed for headache or mild pain. 09/08/18   Geradine Girt, DO   aspirin EC 81 MG tablet Take 1 tablet (81 mg total) by mouth daily. 02/07/18   Burtis Junes, NP  atorvastatin (LIPITOR) 80 MG tablet Take 1 tablet (80 mg total) by mouth daily. 10/27/21   Elgie Collard, PA-C  Blood Pressure Monitoring (BLOOD PRESSURE MONITOR/S CUFF) MISC Check blood pressure every day 10/27/21   Elgie Collard, PA-C  calcitRIOL (ROCALTROL) 0.25 MCG capsule Take 5 capsules (1.25 mcg total) by mouth every Monday, Wednesday, and Friday with hemodialysis. 11/09/17   Elgie Collard, PA-C  calcium carbonate (TUMS - DOSED IN MG ELEMENTAL CALCIUM) 500 MG chewable tablet Chew 500 mg by mouth daily. 04/25/20   [provider]  clopidogrel (PLAVIX) 75 MG tablet Take 1 tablet (75 mg total) by mouth daily. 10/27/21   Elgie Collard, PA-C  Darbepoetin Alfa (ARANESP) 200 MCG/0.4ML SOSY injection Inject 0.4 mLs (200 mcg total) into the vein every Monday with hemodialysis. 12/04/18   Angiulli, Lavon Paganini, PA-C  insulin aspart (NOVOLOG) 100 UNIT/ML FlexPen Inject 1-6 Units into the skin 3 (three) times daily with meals. CBG 151 - 200: 1 unit, CBG 201-250: 2 units, CBG 251-300: 3 units, CBG 301-350: 4 units, CBG 351-400: 5 units, CBG > 400: Give 6 units 08/04/21   Dwyane Dee, MD  insulin glargine (LANTUS) 100 UNIT/ML Solostar Pen Inject 5 Units into the skin daily. 08/04/21   Dwyane Dee, MD  isosorbide mononitrate (IMDUR) 30 MG 24 hr tablet Take 1 tablet (30 mg total) by mouth daily. 12/22/21   Richardson Dopp T, PA-C  lanthanum (FOSRENOL) 1000 MG chewable tablet Chew 2 tablets (2,000 mg total) by mouth 3 (three) times daily with meals. 11/28/18   Angiulli, Lavon Paganini, PA-C  methocarbamol (ROBAXIN) 500 MG tablet Take 1,000 mg by mouth 4 (four) times daily as needed for muscle spasms. 06/17/20   [provider]  metoprolol succinate (TOPROL XL) 25 MG 24 hr tablet Take 1 tablet (25 mg total) by mouth daily. 12/22/21   Richardson Dopp T, PA-C  multivitamin (RENA-VIT) TABS tablet Take 1 tablet by  mouth at bedtime. 08/17/18   Kayleen Memos, DO  nitroGLYCERIN (NITROSTAT) 0.4 MG SL tablet Place 1 tablet (0.4 mg total) under the tongue every 5 (five) minutes as needed for chest pain. 10/27/21   Elgie Collard, PA-C  pantoprazole (PROTONIX) 40 MG tablet Take 1 tablet (40 mg total) by mouth daily. 09/04/21 09/04/22  Dwyane Dee, MD  pantoprazole (PROTONIX) 40 MG tablet Take 40 mg by mouth daily.    [provider]    Physical Exam: Vitals:   03/12/22 1405 03/12/22 1522 03/12/22 1545 03/12/22 1842  BP: (!) 138/125 94/81  96/78  Pulse: 70 68  72  Resp: 12 16 18 16   Temp:  98.3 F (36.8 C)  98.6 F (37 C)  TempSrc:  Oral  Oral  SpO2: 98% 95%  96%  Weight:      Height:       General:  Appears calm and comfortable and is in NAD Eyes:   EOMI, normal lids, iris ENT:  grossly normal hearing, lips & tongue, mmm Neck:  no LAD, masses or thyromegaly Cardiovascular:  RRR, no m/r/g. No LE edema.  Respiratory:   CTA bilaterally with no wheezes/rales/rhonchi.  Normal respiratory effort. Abdomen:  soft, NT, ND Skin:  no rash or induration seen on limited exam Musculoskeletal:  grossly normal tone BUE/BLE, good ROM, no bony abnormality; s/p B BKA Psychiatric:  somnolent - > grossly normal mood and affect, speech fluent and appropriate, AOx3 Neurologic:  CN 2-12 grossly intact, moves all extremities in coordinated fashion   Radiological Exams on Admission: Independently reviewed - see discussion in A/P where applicable  CT Head Wo Contrast  Result Date: 03/12/2022 CLINICAL DATA:  Dialysis patient, concern for seizure activity EXAM: CT HEAD WITHOUT CONTRAST TECHNIQUE: Contiguous axial images were obtained from the base of the skull through the vertex without intravenous contrast. RADIATION DOSE REDUCTION: This exam was performed according to the departmental dose-optimization program which includes automated exposure control, adjustment of the mA and/or kV according to patient size  and/or use of iterative reconstruction technique. COMPARISON:  02/22/2006 FINDINGS: Brain: Limited with motion artifact. No definite acute intracranial hemorrhage, mass lesion, new infarction, midline shift, herniation, or hydrocephalus. No extra-axial fluid collection. No focal mass effect or edema. Cisterns are patent. No cerebellar abnormality. Vascular: No hyperdense vessel or unexpected calcification. Skull: Also limited with motion. No acute abnormality. Mastoids are clear. Sinuses/Orbits: Scattered chronic sinus mucosal thickening worse in the right maxillary sinus. No sinus air-fluid level. Orbits unremarkable. Other: None. IMPRESSION: Limited with motion. No significant acute intracranial abnormality by noncontrast CT. Electronically Signed   By: Jerilynn Mages.  Shick M.D.   On: 03/12/2022 13:40   DG Chest 2 View  Result Date: 03/12/2022 CLINICAL DATA:  Cough post seizures today. EXAM: CHEST - 2 VIEW COMPARISON:  Radiographs 12/06/2021.  CT 07/17/2009. FINDINGS: Suboptimal inspiration, especially on the lateral view. 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 There is vascular congestion with small pleural effusions, mild edema and probable left lower lobe atelectasis. No pneumothorax. The bones appear unremarkable. Telemetry leads overlie the chest. IMPRESSION: Similar radiographic appearance of the chest with cardiomegaly, edema and bilateral pleural effusions suspicious for congestive heart failure. Electronically Signed   By: Richardean Sale M.D.   On: 03/12/2022 13:36    EKG: Independently reviewed.  NSR with rate 72; prolonged QTc 518; nonspecific ST changes with no evidence of acute ischemia   Labs on Admission: I have personally reviewed the available labs and imaging studies at the time of the admission.  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[place])   Hyperlipidemia LDL goal <70   Marijuana abuse   Insomnia    Seizure -Patient without known h/o seizures presenting with concern for acute seizure during HD -Patient placed  in observation overnight for further evaluation -Will not load with AED for initial seizure -Will order EEG -He also will need driving restriction for at least 6 months -Seizure precautions -Ativan prn   ESRD on HD -Patient on chronic MWF HD -Nephrology prn order set utilized -He does not appear to be volume overloaded or otherwise in need of acute HD but his CXR appeared to show volume overload and he was only able to complete about 1 hour of HD -Nephrology is aware that patient will need HD  -Continue Calcitriol, Fosrenol  Insomnia -He reports difficulty sleeping, none in 4 nights -He also reports depression associated -Denies SI/HI -Will give Ambien (no trazodone, Remeron due to seizures, prolonged QT)  CAD/Chronic combined CHF -No c/o CP/SOB -s/p CABG -Volume control with HD -Continue Imdur  HTN -Continue Toprol XL  DM -His last A1c was 9.1, indicating poor control; will repeat -Continue glargine -Cover with very sensitive-scale SSI   PVD  -s/p B BKA -Has prosthetics at the bedside and stumps appear C/D/I -Continue ASA, Plavix, Lipitor  Marijuana abuse -No longer smoking cigarettes and denies other drugs but acknowledges routine use of THC -Cessation encouraged     Advance Care Planning:   Code Status: Full Code   Consults: Nephrology  DVT Prophylaxis: Heparin  Family Communication: None present; he is capable of communicating with family at this time  Severity of Illness: The appropriate patient status for this patient is OBSERVATION. Observation status is judged to be reasonable and  necessary in order to provide the required intensity of service to ensure the patient's safety. The patient's presenting symptoms, physical exam findings, and initial radiographic and laboratory data in the context of their medical condition is felt to place them at decreased risk for further clinical deterioration. Furthermore, it is anticipated that the patient will be medically stable for discharge from the hospital within 2 midnights of admission.   Author: Karmen Bongo, MD 03/12/2022 6:51 PM  For on call review [email or web address].

## 2022-03-12 NOTE — ED Notes (Signed)
Pt started presenting differently than when he was when he came in. Pt unable to remain still in bed or keep attention long enough to answer questions. This RN went in to check on pt where I found pill bottle full of powdery substance along with red straw. This RN took bottle out of pts room and to nurses station to get a second opinion from another RN. This RN informed he needed to [employer]. Charge RN, Deneise Lever, informed and pill bottle given to her. Charge RN, Deneise Lever, called for [employer] assistance.

## 2022-03-12 NOTE — Consult Note (Signed)
Reason for Consult: To manage dialysis and dialysis related needs Referring Physician: Sharyne [name] Sr. is an 46 y.o. male with past medical history significant for DM, HTN, CAD s/p CABG, PAD s/p bilat BKA's, medical noncompliance, ESRD on HD at [facility] MWF for many years- last hospitalization was in December of 22.  He is brought to the ER today from dialysis for a witnessed sz after one hour of HD.  He had missed HD on Wednesday but was there on Monday.  Work up on ER  HCT negative, WBC 4.8-  other labs not that remarkable-  VS OK-  was noted to be in possession of a white powdery substance per nursing notes-  it has been taken away from him. Pt initially sleeping-  startled awake-  then asks for something to eat.  Denies drugs other than marijuana-  quit smoking 8 days ago !  Said he hasnt slept for 4 days but then I talked to I think a girlfriend-  she said he sleeps all day so then cant sleep at night. He says he has never had a sz before.  Of note-  BP was a little low to start HD today but no documented really low-  girlfriend says coreg was recently changed to toprol and also started on imdur   Dialyzes at [facility] MWF 3 hours and 45 minutes  EDW 75.5-  profile #2. HD Bath 2/3.5, Dialyzer 180, Heparin yes. Access AVF 450 BFR. Calcitriol 1 mcg MWF  last hgb 13, last PTH 264 and phos 5.7  Past Medical History:  Diagnosis Date   Anemia    Atherosclerosis of lower extremity (HCC)    Blood clot in vein    right calf   CAD (coronary artery disease)    Cataracts, bilateral    Chronic combined systolic and diastolic heart failure (HCC)    Complication of anesthesia    Depression    ESRD (end stage renal disease) on dialysis ([place])    "MWF [place]" (03/08/2017)   GERD (gastroesophageal reflux disease)    Heart murmur    History of blood transfusion    "related to OR"   Hypertension    Myocardial infarction ([place])     " light"   Nonhealing surgical wound    nonviable tissue    PONV (postoperative nausea and vomiting)    S/P unilateral BKA (below knee amputation), left ([place])    Type II diabetes mellitus ([place])    Wears glasses     Past Surgical History:  Procedure Laterality Date   ABDOMINAL AORTOGRAM N/A 11/02/2016   Procedure: Abdominal Aortogram;  Surgeon: Waynetta Sandy, MD;  Location: [place] CV [facility];  Service: Cardiovascular;  Laterality: N/A;   ABDOMINAL AORTOGRAM W/LOWER EXTREMITY Right 08/07/2018   Procedure: ABDOMINAL AORTOGRAM W/LOWER EXTREMITY;  Surgeon: [name], MD;  Location: [place] [facility];  Service: Cardiovascular;  Laterality: Right;   AMPUTATION Left 09/27/2013   Procedure: LEFT GREAT TOE AMPUTATION;  Surgeon: [name], MD;  Location: [place];  Service: Orthopedics;  Laterality: Left;   AMPUTATION Right 08/15/2015   Procedure: Right Great Toe Amputation;  Surgeon: [name], MD;  Location: [place];  Service: Orthopedics;  Laterality: Right;   AMPUTATION Left 11/05/2016   Procedure: TRANSMETATARSAL AMPUTATION LEFT FOOT;  Surgeon: [name], MD;  Location: [place];  Service: Orthopedics;  Laterality: Left;   AMPUTATION Left 03/11/2017   Procedure: LEFT BELOW KNEE AMPUTATION;  Surgeon:  [name], MD;  Location: [place];  Service: Orthopedics;  Laterality: Left;   AMPUTATION Right 03/11/2017   Procedure: RIGHT 2ND TOE AMPUTATION;  Surgeon: [name], MD;  Location: [place];  Service: Orthopedics;  Laterality: Right;   AMPUTATION Right 11/17/2018   Procedure: AMPUTATION BELOW KNEE;  Surgeon: [name], MD;  Location: [place];  Service: Vascular;  Laterality: Right;   AV FISTULA PLACEMENT  left arm   CORONARY ARTERY BYPASS GRAFT N/A 11/04/2017   Procedure: CORONARY ARTERY BYPASS GRAFTING (CABG) x three, using left internal mammary artery and right    leg greater saphenous vein;  Surgeon: Melrose Nakayama, MD;  Location: [place];  Service: Open Heart Surgery;  Laterality: N/A;   ESOPHAGOGASTRODUODENOSCOPY  (EGD) WITH PROPOFOL N/A 08/02/2021   Procedure: ESOPHAGOGASTRODUODENOSCOPY (EGD) WITH PROPOFOL;  Surgeon: Yetta Flock, MD;  Location: [place];  Service: Gastroenterology;  Laterality: N/A;   FASCIOTOMY Right 08/07/2018   Procedure: FOUR COMPARTMENT FASCIOTOMY OF RIGHT LOWER LEG;  Surgeon: Rosetta Posner, MD;  Location: [place];  Service: Vascular;  Laterality: Right;   FASCIOTOMY CLOSURE Right 08/10/2018   Procedure: FASCIOTOMY CLOSURE RIGHT LOWER EXTREMITY;  Surgeon: [name], MD;  Location: [place];  Service: Vascular;  Laterality: Right;   HEMATOMA EVACUATION Right 08/07/2018   Procedure: EVACUATION HEMATOMA;  Surgeon: Rosetta Posner, MD;  Location: [place];  Service: Vascular;  Laterality: Right;   HEMOSTASIS CLIP PLACEMENT  08/02/2021   Procedure: HEMOSTASIS CLIP PLACEMENT;  Surgeon: Yetta Flock, MD;  Location: [place] END[facility];  Service: Gastroenterology;;   HOT HEMOSTASIS N/A 08/02/2021   Procedure: HOT HEMOSTASIS (ARGON PLASMA COAGULATION/BICAP);  Surgeon: Yetta Flock, MD;  Location: [facility] ENDOSCOPY;  Service: Gastroenterology;  Laterality: N/A;   LEFT HEART CATH AND CORONARY ANGIOGRAPHY N/A 10/31/2017   Procedure: LEFT HEART CATH AND CORONARY ANGIOGRAPHY;  Surgeon: Troy Sine, MD;  Location: [place] [facility];  Service: Cardiovascular;  Laterality: N/A;   LEFT HEART CATHETERIZATION WITH CORONARY ANGIOGRAM N/A 09/13/2014   Procedure: LEFT HEART CATHETERIZATION WITH CORONARY ANGIOGRAM;  Surgeon: Sinclair Grooms, MD;  Location: [facility] C[facility] LAB;  Service: Cardiovascular;  Laterality: N/A;   LOWER EXTREMITY ANGIOGRAPHY Bilateral 11/02/2016   Procedure: Lower Extremity Angiography;  Surgeon: Waynetta Sandy, MD;  Location: [place] [facility];  Service: Cardiovascular;  Laterality: Bilateral;   PERIPHERAL VASCULAR ATHERECTOMY Left 11/02/2016   Procedure: Peripheral Vascular Atherectomy;  Surgeon: Waynetta Sandy, MD;  Location: [place] [facility];   Service: Cardiovascular;  Laterality: Left;  PERONEAL   PERIPHERAL VASCULAR ATHERECTOMY Right 08/07/2018   Procedure: PERIPHERAL VASCULAR ATHERECTOMY;  Surgeon: [name], MD;  Location: [place] [facility];  Service: Cardiovascular;  Laterality: Right;  Peroneal artery   PERIPHERAL VASCULAR BALLOON ANGIOPLASTY Right 08/07/2018   Procedure: PERIPHERAL VASCULAR BALLOON ANGIOPLASTY;  Surgeon: [name], MD;  Location: [place] [facility];  Service: Cardiovascular;  Laterality: Right;  anterior tibial artery   STUMP REVISION Left 01/11/2017   Procedure: Revision Left Transmetatarsal Amputation;  Surgeon: [name], MD;  Location: [place];  Service: Orthopedics;  Laterality: Left;   TEE WITHOUT CARDIOVERSION N/A 11/04/2017   Procedure: TRANSESOPHAGEAL ECHOCARDIOGRAM (TEE);  Surgeon: Melrose Nakayama, MD;  Location: [place];  Service: Open Heart Surgery;  Laterality: N/A;   TRANSMETATARSAL AMPUTATION Right 08/10/2018   Procedure: RIGHT FIFTH TOE AMPUTATION;  Surgeon: [name], MD;  Location: [place];  Service: Vascular;  Laterality: Right;   TRANSMETATARSAL AMPUTATION Right 09/14/2018   Procedure: TRANSMETATARSAL AMPUTATION;  Surgeon: [name], MD;  Location: [facility];  Service: Vascular;  Laterality: Right;    Family History  Problem Relation Age of Onset   Heart failure Mother    Hypertension Mother     Social History:  reports that he quit smoking about 3 years ago. His smoking use included cigarettes. He has a 13.00 pack-year smoking history. He has never used smokeless tobacco. He reports that he does not currently use alcohol. He reports current drug use. Drug: Marijuana.  Allergies:  Allergies  Allergen Reactions   Coconut (Cocos Nucifera) Anaphylaxis and Other (See Comments)    Can use topically, allergic to coconut foods     Medications: I have reviewed the patient's current medications.  Results for orders placed or performed during the  hospital encounter of 03/12/22 (from the past 48 hour(s))  Comprehensive metabolic panel     Status: Abnormal   Collection Time: 03/12/22 12:44 PM  Result Value Ref Range   Sodium 131 (L) 135 - 145 mmol/L   Potassium 3.6 3.5 - 5.1 mmol/L   Chloride 93 (L) 98 - 111 mmol/L   CO2 22 22 - 32 mmol/L   Glucose, Bld 159 (H) 70 - 99 mg/dL    Comment: Glucose reference range applies only to samples taken after fasting for at least 8 hours.   BUN 45 (H) 6 - 20 mg/dL   Creatinine, Ser 9.55 (H) 0.61 - 1.24 mg/dL   Calcium 8.2 (L) 8.9 - 10.3 mg/dL   Total Protein 7.5 6.5 - 8.1 g/dL   Albumin 3.2 (L) 3.5 - 5.0 g/dL   AST 30 15 - 41 U/L   ALT 21 0 - 44 U/L   Alkaline Phosphatase 127 (H) 38 - 126 U/L   Total Bilirubin 1.5 (H) 0.3 - 1.2 mg/dL   GFR, Estimated 6 (L) >60 mL/min    Comment: (NOTE) Calculated using the CKD-EPI Creatinine Equation (2021)    Anion gap 16 (H) 5 - 15    Comment: Performed at [facility], [place] [address], [place], [place] [address]  Ethanol     Status: None   Collection Time: 03/12/22 12:44 PM  Result Value Ref Range   Alcohol, Ethyl (B) <10 <10 mg/dL    Comment: (NOTE) Lowest detectable limit for serum alcohol is 10 mg/dL.  For medical purposes only. Performed at [facility], [place] [address], [place], [place] [address]   CBC with Differential     Status: Abnormal   Collection Time: 03/12/22 12:44 PM  Result Value Ref Range   WBC 4.8 4.0 - 10.5 K/uL   RBC 4.97 4.22 - 5.81 MIL/uL   Hemoglobin 13.7 13.0 - 17.0 g/dL   HCT 40.6 39.0 - 52.0 %   MCV 81.7 80.0 - 100.0 fL   MCH 27.6 26.0 - 34.0 pg   MCHC 33.7 30.0 - 36.0 g/dL   RDW 20.3 (H) 11.5 - 15.5 %   Platelets 122 (L) 150 - 400 K/uL    Comment: REPEATED TO VERIFY   nRBC 0.6 (H) 0.0 - 0.2 %   Neutrophils Relative % 51 %   Neutro Abs 2.5 1.7 - 7.7 K/uL   Lymphocytes Relative 37 %   Lymphs Abs 1.8 0.7 - 4.0 K/uL   Monocytes Relative 9 %   Monocytes Absolute 0.4 0.1 - 1.0 K/uL   Eosinophils  Relative 2 %  Eosinophils Absolute 0.1 0.0 - 0.5 K/uL   Basophils Relative 1 %   Basophils Absolute 0.0 0.0 - 0.1 K/uL   Immature Granulocytes 0 %   Abs Immature Granulocytes 0.01 0.00 - 0.07 K/uL    Comment: Performed at [facility] [address], [place], [place] [address]    CT Head Wo Contrast  Result Date: 03/12/2022 CLINICAL DATA:  Dialysis patient, concern for seizure activity EXAM: CT HEAD WITHOUT CONTRAST TECHNIQUE: Contiguous axial images were obtained from the base of the skull through the vertex without intravenous contrast. RADIATION DOSE REDUCTION: This exam was performed according to the departmental dose-optimization program which includes automated exposure control, adjustment of the mA and/or kV according to patient size and/or use of iterative reconstruction technique. COMPARISON:  02/22/2006 FINDINGS: Brain: Limited with motion artifact. No definite acute intracranial hemorrhage, mass lesion, new infarction, midline shift, herniation, or hydrocephalus. No extra-axial fluid collection. No focal mass effect or edema. Cisterns are patent. No cerebellar abnormality. Vascular: No hyperdense vessel or unexpected calcification. Skull: Also limited with motion. No acute abnormality. Mastoids are clear. Sinuses/Orbits: Scattered chronic sinus mucosal thickening worse in the right maxillary sinus. No sinus air-fluid level. Orbits unremarkable. Other: None. IMPRESSION: Limited with motion. No significant acute intracranial abnormality by noncontrast CT. Electronically Signed   By: Jerilynn Mages.  Shick M.D.   On: 03/12/2022 13:40   DG Chest 2 View  Result Date: 03/12/2022 CLINICAL DATA:  Cough post seizures today. EXAM: CHEST - 2 VIEW COMPARISON:  Radiographs 12/06/2021.  CT 07/17/2009. FINDINGS: Suboptimal inspiration, especially on the lateral view. 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 There is vascular congestion with small pleural effusions, mild edema and probable left  lower lobe atelectasis. No pneumothorax. The bones appear unremarkable. Telemetry leads overlie the chest. IMPRESSION: Similar radiographic appearance of the chest with cardiomegaly, edema and bilateral pleural effusions suspicious for congestive heart failure. Electronically Signed   By: Richardean Sale M.D.   On: 03/12/2022 13:36    ROS: really negative at this time Blood pressure (!) 138/125, pulse 70, temperature 98.4 F (36.9 C), temperature source Oral, resp. rate 12, height 6\' 2"  (1.88 m), weight 84.8 kg, SpO2 98 %. General appearance: alert and distracted Eyes: conjunctivae/corneas clear. PERRL, EOM's intact. Fundi benign. Resp: clear to auscultation bilaterally Cardio: regular rate and rhythm, S1, S2 normal, no murmur, click, rub or gallop GI: soft, non-tender; bowel sounds normal; no masses,  no organomegaly Extremities: bilat BKA-  some dep edema to thighs  Neurologic: Grossly normal  AVF-  good thrill and bruit  Assessment/Plan: 46 year old BM with multiple medical issues including ESRD-  presents with new onset sz during HD 1 sz-  new onset, happened during HD.  Since pt missed HD the treatment prior brings to mind a diagnosis of dialysis equilibrium but certainly is not the first time he has missed HD and BUN is not very high here so dont think that is in play.  His BP is lower than it usually is for him-  could this be the issue ?  HCT negative-  I assume primary will get neuro involved to help  2 ESRD: normally MWF via AVF-  missed Wed-  only got an hour today.  However, labs look fine-  will leave him alone tonight-  do HD off schedule tomorrow and then Monday to get back on schedule  3 Hypertension: BP lower than usual-  meds changed to toprol and imdur added-  also stopped smoking  which could be bringing BP down-  hold meds for now as we need some of a BP in order to achieve UF with HD 4. Anemia of ESRD: hgb has been above goal-  not on ESA 5. Metabolic Bone Disease: will  continue calcitriol with HD and also fosrenol for phos binding    Louis Meckel 03/12/2022, 3:18 PM

## 2022-03-12 NOTE — Progress Notes (Signed)
Pt finally agreed to take medication.

## 2022-03-12 NOTE — Progress Notes (Signed)
Pt refusing all medication, originally stated he "didn't want to take them on an empty stomach", arranged for some food for the patient, attempted to give medication again and patient refused to take any of the medication. Educated the pt on the importance of the medications, pt still refused. Notified charge nurse and MD.

## 2022-03-13 ENCOUNTER — Observation Stay (HOSPITAL_COMMUNITY): Payer: Medicaid Other

## 2022-03-13 DIAGNOSIS — D631 Anemia in chronic kidney disease: Secondary | ICD-10-CM | POA: Diagnosis present

## 2022-03-13 DIAGNOSIS — F32A Depression, unspecified: Secondary | ICD-10-CM | POA: Diagnosis present

## 2022-03-13 DIAGNOSIS — Z91199 Patient's noncompliance with other medical treatment and regimen due to unspecified reason: Secondary | ICD-10-CM | POA: Diagnosis not present

## 2022-03-13 DIAGNOSIS — I959 Hypotension, unspecified: Secondary | ICD-10-CM | POA: Diagnosis not present

## 2022-03-13 DIAGNOSIS — Z951 Presence of aortocoronary bypass graft: Secondary | ICD-10-CM | POA: Diagnosis not present

## 2022-03-13 DIAGNOSIS — I132 Hypertensive heart and chronic kidney disease with heart failure and with stage 5 chronic kidney disease, or end stage renal disease: Secondary | ICD-10-CM | POA: Diagnosis present

## 2022-03-13 DIAGNOSIS — Z8249 Family history of ischemic heart disease and other diseases of the circulatory system: Secondary | ICD-10-CM | POA: Diagnosis not present

## 2022-03-13 DIAGNOSIS — E1051 Type 1 diabetes mellitus with diabetic peripheral angiopathy without gangrene: Secondary | ICD-10-CM | POA: Diagnosis present

## 2022-03-13 DIAGNOSIS — E785 Hyperlipidemia, unspecified: Secondary | ICD-10-CM | POA: Diagnosis present

## 2022-03-13 DIAGNOSIS — I2510 Atherosclerotic heart disease of native coronary artery without angina pectoris: Secondary | ICD-10-CM | POA: Diagnosis present

## 2022-03-13 DIAGNOSIS — R569 Unspecified convulsions: Principal | ICD-10-CM

## 2022-03-13 DIAGNOSIS — E1065 Type 1 diabetes mellitus with hyperglycemia: Secondary | ICD-10-CM | POA: Diagnosis present

## 2022-03-13 DIAGNOSIS — N186 End stage renal disease: Secondary | ICD-10-CM | POA: Diagnosis present

## 2022-03-13 DIAGNOSIS — Z89511 Acquired absence of right leg below knee: Secondary | ICD-10-CM | POA: Diagnosis not present

## 2022-03-13 DIAGNOSIS — I5042 Chronic combined systolic (congestive) and diastolic (congestive) heart failure: Secondary | ICD-10-CM | POA: Diagnosis present

## 2022-03-13 DIAGNOSIS — F1210 Cannabis abuse, uncomplicated: Secondary | ICD-10-CM | POA: Diagnosis present

## 2022-03-13 DIAGNOSIS — Z89512 Acquired absence of left leg below knee: Secondary | ICD-10-CM | POA: Diagnosis not present

## 2022-03-13 DIAGNOSIS — I252 Old myocardial infarction: Secondary | ICD-10-CM | POA: Diagnosis not present

## 2022-03-13 DIAGNOSIS — Z992 Dependence on renal dialysis: Secondary | ICD-10-CM | POA: Diagnosis not present

## 2022-03-13 DIAGNOSIS — F5104 Psychophysiologic insomnia: Secondary | ICD-10-CM | POA: Diagnosis present

## 2022-03-13 DIAGNOSIS — E1022 Type 1 diabetes mellitus with diabetic chronic kidney disease: Secondary | ICD-10-CM | POA: Diagnosis present

## 2022-03-13 DIAGNOSIS — Z87891 Personal history of nicotine dependence: Secondary | ICD-10-CM | POA: Diagnosis not present

## 2022-03-13 DIAGNOSIS — I10 Essential (primary) hypertension: Secondary | ICD-10-CM | POA: Diagnosis not present

## 2022-03-13 DIAGNOSIS — N2581 Secondary hyperparathyroidism of renal origin: Secondary | ICD-10-CM | POA: Diagnosis present

## 2022-03-13 LAB — RENAL FUNCTION PANEL
Albumin: 2.8 g/dL — ABNORMAL LOW (ref 3.5–5.0)
Anion gap: 17 — ABNORMAL HIGH (ref 5–15)
BUN: 55 mg/dL — ABNORMAL HIGH (ref 6–20)
CO2: 21 mmol/L — ABNORMAL LOW (ref 22–32)
Calcium: 8.1 mg/dL — ABNORMAL LOW (ref 8.9–10.3)
Chloride: 95 mmol/L — ABNORMAL LOW (ref 98–111)
Creatinine, Ser: 10.55 mg/dL — ABNORMAL HIGH (ref 0.61–1.24)
GFR, Estimated: 6 mL/min — ABNORMAL LOW (ref >60)
Glucose, Bld: 170 mg/dL — ABNORMAL HIGH (ref 70–99)
Phosphorus: 6.6 mg/dL — ABNORMAL HIGH (ref 2.5–4.6)
Potassium: 3.6 mmol/L (ref 3.5–5.1)
Sodium: 133 mmol/L — ABNORMAL LOW (ref 135–145)

## 2022-03-13 LAB — CBC
HCT: 39.6 % (ref 39.0–52.0)
Hemoglobin: 13.6 g/dL (ref 13.0–17.0)
MCH: 28.2 pg (ref 26.0–34.0)
MCHC: 34.3 g/dL (ref 30.0–36.0)
MCV: 82.2 fL (ref 80.0–100.0)
Platelets: 116 10*3/uL — ABNORMAL LOW (ref 150–400)
RBC: 4.82 MIL/uL (ref 4.22–5.81)
RDW: 19.9 % — ABNORMAL HIGH (ref 11.5–15.5)
WBC: 4.6 10*3/uL (ref 4.0–10.5)
nRBC: 0.4 % — ABNORMAL HIGH (ref 0.0–0.2)

## 2022-03-13 LAB — HEMOGLOBIN A1C
Hgb A1c MFr Bld: 9.4 % — ABNORMAL HIGH (ref 4.8–5.6)
Mean Plasma Glucose: 223.08 mg/dL

## 2022-03-13 LAB — GLUCOSE, CAPILLARY
Glucose-Capillary: 203 mg/dL — ABNORMAL HIGH (ref 70–99)
Glucose-Capillary: 144 mg/dL — ABNORMAL HIGH (ref 70–99)
Glucose-Capillary: 277 mg/dL — ABNORMAL HIGH (ref 70–99)

## 2022-03-13 MED ORDER — HEPARIN SODIUM (PORCINE) 1000 UNIT/ML DIALYSIS: 1000.0000 [IU] | INTRAMUSCULAR | Status: DC | PRN

## 2022-03-13 MED ORDER — LIDOCAINE HCL (PF) 1 % IJ SOLN: 5.0000 mL | INTRAMUSCULAR | Status: DC | PRN

## 2022-03-13 MED ORDER — PENTAFLUOROPROP-TETRAFLUOROETH EX AERO: 1.0000 | INHALATION_SPRAY | CUTANEOUS | Status: DC | PRN

## 2022-03-13 MED ORDER — LIDOCAINE-PRILOCAINE 2.5-2.5 % EX CREA
1.0000 | TOPICAL_CREAM | CUTANEOUS | Status: DC | PRN
  Filled 2022-03-13: qty 5

## 2022-03-13 MED ORDER — ANTICOAGULANT SODIUM CITRATE 4% (200MG/5ML) IV SOLN
5.0000 mL | Status: DC | PRN
  Filled 2022-03-13: qty 5

## 2022-03-13 MED ORDER — ALTEPLASE 2 MG IJ SOLR: 2.0000 mg | Freq: Once | INTRAMUSCULAR | Status: DC | PRN

## 2022-03-13 MED ORDER — HEPARIN SODIUM (PORCINE) 1000 UNIT/ML DIALYSIS: 20.0000 [IU]/kg | INTRAMUSCULAR | Status: DC | PRN

## 2022-03-13 NOTE — Procedures (Signed)
Patient Name: [name] Godlewski Sr.  MRN: [id number]  Epilepsy Attending: Lora Havens  Referring Physician/Provider: Karmen Bongo, MD  Date: 03/13/2022 Duration: 22.12 mins  Patient history: 46 yo M patient without known h/o seizures presenting with concern for acute seizure during HD. EEG to evaluate for seizure  Level of alertness: Awake  AEDs during EEG study: None  Technical aspects: This EEG study was done with scalp electrodes positioned according to the 10-20 International system of electrode placement. Electrical activity was reviewed with band pass filter of 1-70Hz , sensitivity of 7 uV/mm, display speed of 54mm/sec with a 60Hz  notched filter applied as appropriate. EEG data were recorded continuously and digitally stored.  Video monitoring was available and reviewed as appropriate.  Description: The posterior dominant rhythm consists of 7.5 Hz activity of moderate voltage (25-35 uV) seen predominantly in posterior head regions, symmetric and reactive to eye opening and eye closing. Hyperventilation and photic stimulation were not performed.    IMPRESSION: This study is within normal limits. No seizures or epileptiform discharges were seen throughout the recording.  A normal interictal EEG does not exclude nor support the diagnosis of epilepsy.   [name] Barbra Sarks

## 2022-03-13 NOTE — Progress Notes (Signed)
PROGRESS NOTE  [name] Espin Sr.  DOB: [birth year]  PCP: Sandi Mariscal, MD HUT:[id number]  DOA: 03/12/2022  LOS: 0 days  Hospital Day: 2  Brief narrative: [name] Labella Sr. is a 46 y.o. male with PMH significant for ESRD-HD-MWF, DM 2, HTN, CAD/CABG, PAD s/p bilateral BKA, chronic combined systolic and diastolic CHF, GERD Patient was brought to the ED after she had an episode of seizure during hemodialysis. Patient reports that he has not been feeling well throughout the week since he hasn't slept in 4 days, because of insomnia.  He felt ok at HD and fell asleep and when he awoke he was surrounded by people and was transported to the ER.   He does not think he had a seizure.  He knew where he was when he awoke.  No prior h/o seizures.  He acknowledges regular use of marijuana but denies use of other drugs.  In the ED, patient was afebrile, hemodynamically stable, alert, awake, oriented x3 Labs showed sodium of 131, platelet slightly low at 122 CT head did not show any acute intracranial abnormality Chest x-ray showed cardiomegaly, pulmonary edema and bilateral effusions Admitted to hospitalist service  Subjective: Patient was seen and examined this morning.  Pleasant middle-aged African-American male.  Lying on bed.  Sleepy.  Opens eyes and verbal command.  Falls right back to sleep  Labs this morning with A1c 9.4,  Assessment and plan: Seizure New onset, happened during dialysis.  Patient denies any history of seizure.  Reports he has chronic insomnia and has not been able to fall asleep last 4 days.   Placed in observation overnight for further evaluation.  AED was not started because it is first seizure occurrence EEG report did not show any evidence of seizure or epileptiform discharges. Neurology consulted. Seizure precautions and restrictions   ESRD-HD-MWF Nephrology consulted.  Noted a plan of off schedule dialysis today Continue Calcitriol, Fosrenol  Uncontrolled  type 2 diabetes mellitus -A1c 10.4 on 7/21 -PTA on Lantus 5 units daily, Premeal sliding scale insulin.   -Currently continued on the same. Recent Labs  Lab 03/13/22 0630  GLUCAP 203*   Chronic combined CHF Essential hypertension -PTA on Toprol, Imdur  CAD/CABG PAD/bilateral BKA -No anginal symptoms currently. -Has prosthetics at bedside -Continue aspirin, Plavix, Lipitor   Insomnia -He reports difficulty sleeping, none in 4 nights -He also reports depression associated -Denies SI/HI -Was given Ambien last night (no trazodone, Remeron due to seizures, prolonged QT)   Marijuana abuse -No longer smoking cigarettes and denies other drugs but acknowledges routine use of THC -Cessation encouraged  Goals of care   Code Status: Full Code    Mobility: Bilateral BKA but uses prosthesis  Skin assessment:     Nutritional status:  Body mass index is 23.97 kg/m.          Diet:  Diet Order             Diet renal/carb modified with fluid restriction Fluid restriction: 1200 mL Fluid; Room service appropriate? Yes; Fluid consistency: Thin  Diet effective now                   DVT prophylaxis:  heparin injection 5,000 Units Start: 03/12/22 1745   Antimicrobials: None Fluid: None Consultants: Neurology Family Communication: None at bedside  Status is: Observation  Continue in-hospital care because: Remains somnolent.  Pending neurology work-up Level of care: Telemetry Medical   Dispo: The patient is from: Home  Anticipated d/c is to: Likely home after clearance              Patient currently is not medically stable to d/c.   Difficult to place patient No     Infusions:   anticoagulant sodium citrate      Scheduled Meds:  aspirin EC  81 mg Oral Daily   atorvastatin  80 mg Oral Daily   [START ON 03/15/2022] calcitRIOL  1.25 mcg Oral Q M,W,F-HD   Chlorhexidine Gluconate Cloth  6 each Topical Q0600   clopidogrel  75 mg Oral Daily    heparin  5,000 Units Subcutaneous Q8H   insulin aspart  0-6 Units Subcutaneous TID WC   insulin glargine-yfgn  5 Units Subcutaneous Daily   isosorbide mononitrate  30 mg Oral Daily   lanthanum  2,000 mg Oral TID WC   loratadine  10 mg Oral Daily   metoprolol succinate  25 mg Oral Daily   multivitamin  1 tablet Oral QHS   pantoprazole  40 mg Oral Daily   sodium chloride flush  3 mL Intravenous Q12H    PRN meds: acetaminophen **OR** acetaminophen, albuterol, alteplase, anticoagulant sodium citrate, calcium carbonate (dosed in mg elemental calcium), camphor-menthol **AND** hydrOXYzine, docusate sodium, feeding supplement (NEPRO CARB STEADY), heparin, heparin, hydrALAZINE, lidocaine (PF), lidocaine-prilocaine, LORazepam, methocarbamol, ondansetron **OR** ondansetron (ZOFRAN) IV, pentafluoroprop-tetrafluoroeth, sorbitol, zolpidem   Antimicrobials: Anti-infectives (From admission, onward)    None       Objective: Vitals:   03/13/22 1349 03/13/22 1412  BP: 112/74 (!) 130/57  Pulse: 63 64  Resp: 19 (!) 21  Temp:    SpO2: 100% 99%   No intake or output data in the 24 hours ending 03/13/22 1415 Filed Weights   03/12/22 1220 03/13/22 1310  Weight: 84.8 kg 84.7 kg   Weight change:  Body mass index is 23.97 kg/m.   Physical Exam: General exam: Middle-aged African-American male.  Not in distress Skin: No rashes, lesions or ulcers. HEENT: Atraumatic, normocephalic, no obvious bleeding Lungs: Clear to auscultation bilaterally CVS: Regular rate and rhythm, no murmur GI/Abd soft, nontender, nondistended, bowel sound present CNS: Sleeping, opens eyes on verbal command.  Also advised to sleep Psychiatry: Unable to examine because of altered mentation Extremities: Bilateral BKA status  Data Review: I have personally reviewed the laboratory data and studies available.  F/u labs ordered Unresulted Labs (From admission, onward)     Start     Ordered   03/13/22 0950  Urine drugs of  abuse scrn w alc, routine (Ref Lab)  Once,   R        03/13/22 0949   03/12/22 1643  Hepatitis B surface antibody,quantitative  (New Admission Hemo Labs (Hepatitis B))  Once,   R        03/12/22 1643   03/12/22 1430  Drug Screen 10 W/Conf, Serum  Once,   R        03/12/22 1430            Signed, Terrilee Croak, MD [facility] 03/13/2022

## 2022-03-13 NOTE — Progress Notes (Signed)
[facility] Progress Note  Subjective: no c/o, seen in HD  Vitals:   03/12/22 1928 03/12/22 1951 03/13/22 0350 03/13/22 0734  BP: (!) 130/23 (!) 141/94 (!) 105/54 (!) 150/47  Pulse: 73  63 65  Resp: 16  19 16   Temp: 97.7 F (36.5 C)  97.9 F (36.6 C) 97.7 F (36.5 C)  TempSrc: Oral  Axillary Oral  SpO2: 91%  99% 100%  Weight:      Height:        Exam: Gen: alert , no distress Resp: clear to auscultation bilaterally Cardio: regular rate and rhythm GI: soft, non-tender; bowel sounds normal Extremities: bilat BKA, some dep edema to thighs  AVF-  good thrill and bruit    OP HD: [facility] MWF 3h 67min  75.5kg  P2  2K/Ca3.5 bath  AVF  Heparin yes - calcitriol 1 ug mwf po - last > Hb 13, pth 264, phos 5.7   Assessment/ Plan: Seizures - new onset, happened during HD.  Possibly dialysis equilibrium but certainly is not the first time he has missed HD and BUN is not very high here so dont think that is in play.  His BP was lower than it usually is for him. CTH was negative. Neurology consulting.  ESRD - normally MWF via AVF. Missed Wed, and only got an hour on Friday.  Will get off-schedule HD today, and next HD on Monday to get back on schedule.  Hypertension - BP lower than usual, meds changed to toprol and imdur added. Hold meds for now as we need some BP in order to achieve UF with HD.  Anemia of ESRD:  Hb has been above goal, not on ESA Metabolic Bone Disease - cont calcitriol with HD and also fosrenol for phos binding    Rob Kalup Jaquith 03/13/2022, 8:10 AM   Recent Labs  Lab 03/12/22 1244  HGB 13.7  ALBUMIN 3.2*  CALCIUM 8.2*  CREATININE 9.55*  K 3.6   No results for input(s): "IRON", "TIBC", "FERRITIN" in the last 168 hours. Inpatient medications:  aspirin EC  81 mg Oral Daily   atorvastatin  80 mg Oral Daily   [START ON 03/15/2022] calcitRIOL  1.25 mcg Oral Q M,W,F-HD   Chlorhexidine Gluconate Cloth  6 each Topical Q0600   clopidogrel  75 mg Oral Daily    heparin  5,000 Units Subcutaneous Q8H   insulin aspart  0-6 Units Subcutaneous TID WC   insulin glargine-yfgn  5 Units Subcutaneous Daily   isosorbide mononitrate  30 mg Oral Daily   lanthanum  2,000 mg Oral TID WC   loratadine  10 mg Oral Daily   metoprolol succinate  25 mg Oral Daily   multivitamin  1 tablet Oral QHS   pantoprazole  40 mg Oral Daily   sodium chloride flush  3 mL Intravenous Q12H    acetaminophen **OR** acetaminophen, albuterol, calcium carbonate (dosed in mg elemental calcium), camphor-menthol **AND** hydrOXYzine, docusate sodium, feeding supplement (NEPRO CARB STEADY), hydrALAZINE, LORazepam, methocarbamol, ondansetron **OR** ondansetron (ZOFRAN) IV, sorbitol, zolpidem

## 2022-03-13 NOTE — Consult Note (Addendum)
Neurology Consultation  Reason for Consult: seizure  Referring Physician: Dr. Pietro Cassis   CC: seizure @ HD   History is obtained from:patient and medical record   HPI: [name] Samson Sr. is a 46 y.o. male with past medical history of HTN, DM, ESRD on HD, bilateral BKA, PAD, CAD, s/p CABG, Chronic combined CHF, MI, DVT, anxiety and depression and GERD and medical non compliance who presents to the [facility] Ed for evaluation of a witnessed seizure at HD. The seizure occurred one hour into the HD Session. In the ER he was noted to be acting differently and he was found with a pill bottle full of a white powdery substance that was taken away I saw the patient in the HD suite. He was sleeping when I arrived. He states he has never had a seizure before. He tells me that he was sleeping at HD yesterday and he was suddenly awakened when the staff were turning him onto his side. He remembers going to sleep. He states he was not confused and knew everything that was happening. He tells me that he did not want to come to the hospital but the staff insisted. He believes he was just sleeping and the staff got nervous. He has not had a lot of sleep in the last few days. He states he has never had a seizure before.    ROS: Full ROS was performed and is negative except as noted in the HPI.    Past Medical History:  Diagnosis Date   Anemia    Atherosclerosis of lower extremity (HCC)    Blood clot in vein    right calf   CAD (coronary artery disease)    Cataracts, bilateral    Chronic combined systolic and diastolic heart failure (HCC)    Complication of anesthesia    Depression    ESRD (end stage renal disease) on dialysis ([place])    "MWF [place]" (03/08/2017)   GERD (gastroesophageal reflux disease)    Heart murmur    History of blood transfusion    "related to OR"   Hypertension    Myocardial infarction ([place])     " light"   Nonhealing surgical wound    nonviable tissue   PONV (postoperative  nausea and vomiting)    S/P unilateral BKA (below knee amputation), left ([place])    Type II diabetes mellitus ([place])    Wears glasses      Family History  Problem Relation Age of Onset   Heart failure Mother    Hypertension Mother      Social History:   reports that he quit smoking about 3 years ago. His smoking use included cigarettes. He has a 13.00 pack-year smoking history. He has never used smokeless tobacco. He reports that he does not currently use alcohol. He reports current drug use. Drug: Marijuana.  Medications  Current Facility-Administered Medications:    acetaminophen (TYLENOL) tablet 650 mg, 650 mg, Oral, Q6H PRN, 650 mg at 03/13/22 1042 **OR** acetaminophen (TYLENOL) suppository 650 mg, 650 mg, Rectal, Q6H PRN, Karmen Bongo, MD   albuterol (PROVENTIL) (2.5 MG/3ML) 0.083% nebulizer solution 2.5 mg, 2.5 mg, Nebulization, Q2H PRN, Karmen Bongo, MD   alteplase (CATHFLO ACTIVASE) injection 2 mg, 2 mg, Intracatheter, Once PRN, Corliss Parish, MD   anticoagulant sodium citrate solution 5 mL, 5 mL, Intracatheter, PRN, Corliss Parish, MD   aspirin EC tablet 81 mg, 81 mg, Oral, Daily, Karmen Bongo, MD, 81 mg at 03/13/22 1043   atorvastatin (LIPITOR)  tablet 80 mg, 80 mg, Oral, Daily, Karmen Bongo, MD, 80 mg at 03/13/22 1043   [START ON 03/15/2022] calcitRIOL (ROCALTROL) capsule 1.25 mcg, 1.25 mcg, Oral, Q M,W,F-HD, Karmen Bongo, MD   calcium carbonate (dosed in mg elemental calcium) suspension 500 mg of elemental calcium, 500 mg of elemental calcium, Oral, Q6H PRN, Karmen Bongo, MD   camphor-menthol [facility]) lotion 1 Application, 1 Application, Topical, V6H PRN **AND** hydrOXYzine (ATARAX) tablet 25 mg, 25 mg, Oral, Q8H PRN, Karmen Bongo, MD   Chlorhexidine Gluconate Cloth 2 % PADS 6 each, 6 each, Topical, Q0600, Karmen Bongo, MD   clopidogrel (PLAVIX) tablet 75 mg, 75 mg, Oral, Daily, Karmen Bongo, MD, 75 mg at 03/13/22 1044   docusate sodium  (ENEMEEZ) enema 283 mg, 1 enema, Rectal, PRN, Karmen Bongo, MD   feeding supplement (NEPRO CARB STEADY) liquid 237 mL, 237 mL, Oral, TID PRN, Karmen Bongo, MD   heparin injection 1,000 Units, 1,000 Units, Intracatheter, PRN, Corliss Parish, MD   heparin injection 1,700 Units, 20 Units/kg, Dialysis, PRN, Corliss Parish, MD   heparin injection 5,000 Units, 5,000 Units, Subcutaneous, Q8H, Karmen Bongo, MD, 5,000 Units at 03/13/22 [phone]   hydrALAZINE (APRESOLINE) injection 5 mg, 5 mg, Intravenous, Q4H PRN, Karmen Bongo, MD   insulin aspart (novoLOG) injection 0-6 Units, 0-6 Units, Subcutaneous, TID WC, Karmen Bongo, MD, 2 Units at 03/13/22 [phone]   insulin glargine-yfgn [facility]) injection 5 Units, 5 Units, Subcutaneous, Daily, Karmen Bongo, MD, 5 Units at 03/13/22 1045   isosorbide mononitrate (IMDUR) 24 hr tablet 30 mg, 30 mg, Oral, Daily, Karmen Bongo, MD, 30 mg at 03/13/22 1042   lanthanum (FOSRENOL) chewable tablet 2,000 mg, 2,000 mg, Oral, TID WC, Karmen Bongo, MD, 2,000 mg at 03/13/22 1044   lidocaine (PF) (XYLOCAINE) 1 % injection 5 mL, 5 mL, Intradermal, PRN, Corliss Parish, MD   lidocaine-prilocaine (EMLA) cream 1 Application, 1 Application, Topical, PRN, Corliss Parish, MD   loratadine (CLARITIN) tablet 10 mg, 10 mg, Oral, Daily, Karmen Bongo, MD, 10 mg at 03/13/22 1043   LORazepam (ATIVAN) injection 4 mg, 4 mg, Intravenous, Q5 Min x 2 PRN, Karmen Bongo, MD   methocarbamol (ROBAXIN) tablet 1,000 mg, 1,000 mg, Oral, QID PRN, Karmen Bongo, MD, 1,000 mg at 03/13/22 1041   metoprolol succinate (TOPROL-XL) 24 hr tablet 25 mg, 25 mg, Oral, Daily, Karmen Bongo, MD, 25 mg at 03/13/22 1042   multivitamin (RENA-VIT) tablet 1 tablet, 1 tablet, Oral, QHS, Karmen Bongo, MD   ondansetron [facility]) tablet 4 mg, 4 mg, Oral, Q6H PRN **OR** ondansetron (ZOFRAN) injection 4 mg, 4 mg, Intravenous, Q6H PRN, Karmen Bongo, MD   pantoprazole (PROTONIX)  EC tablet 40 mg, 40 mg, Oral, Daily, Karmen Bongo, MD, 40 mg at 03/13/22 1044   pentafluoroprop-tetrafluoroeth (GEBAUERS) aerosol 1 Application, 1 Application, Topical, PRN, Corliss Parish, MD   sodium chloride flush (NS) 0.9 % injection 3 mL, 3 mL, Intravenous, Q12H, Karmen Bongo, MD, 3 mL at 03/12/22 2215   sorbitol 70 % solution 30 mL, 30 mL, Oral, PRN, Karmen Bongo, MD   zolpidem (AMBIEN) tablet 5 mg, 5 mg, Oral, QHS PRN, Karmen Bongo, MD   Exam: Current vital signs: BP 112/74   Pulse 63   Temp (!) 97.5 F (36.4 C) Comment: Axillary  Resp 19   Ht 6\' 2"  (1.88 m)   Wt 84.7 kg   SpO2 100%   BMI 23.97 kg/m  Vital signs in last 24 hours: Temp:  [97.5 F (36.4 C)-98.6 F (37 C)] 97.5 F (  36.4 C) (07/29 1310) Pulse Rate:  [63-73] 63 (07/29 1349) Resp:  [12-19] 19 (07/29 1349) BP: (94-150)/(23-125) 112/74 (07/29 1349) SpO2:  [91 %-100 %] 100 % (07/29 1349) Weight:  [84.7 kg] 84.7 kg (07/29 1310)  GENERAL: sleeping, awakens easily to voice in NAD, receiving HD  HEENT: - Normocephalic and atraumatic, dry mm LUNGS - Clear to auscultation bilaterally with no wheezes CV - S1S2 RRR, no m/r/g, equal pulses bilaterally. ABDOMEN - Soft, nontender, nondistended with normoactive BS Ext: warm, well perfused, intact peripheral pulses, no  edema, bilateral BKA  NEURO:  Mental Status: AA&Ox4 Language: speech is clear.  Naming, repetition, fluency, and comprehension intact. Cranial Nerves: PERRL EOMI, visual fields full, no facial asymmetry, facial sensation intact, hearing intact, tongue/uvula/soft palate midline, normal sternocleidomastoid and trapezius muscle strength. No evidence of tongue atrophy or fibrillations Motor: 5/5 in all 4 extremities  Tone: is normal and bulk is normal Sensation- Intact to light touch bilaterally Coordination: FTN intact bilaterally, no ataxia in BLE. Gait- deferred   Labs I have reviewed labs in epic and the results pertinent to this  consultation are:  CBC    Component Value Date/Time   WBC 4.6 03/13/2022 0332   RBC 4.82 03/13/2022 0332   HGB 13.6 03/13/2022 0332   HGB 14.3 02/07/2018 0937   HCT 39.6 03/13/2022 0332   HCT 42.8 02/07/2018 0937   PLT 116 (L) 03/13/2022 0332   PLT 369 02/07/2018 0937   MCV 82.2 03/13/2022 0332   MCV 82 02/07/2018 0937   MCH 28.2 03/13/2022 0332   MCHC 34.3 03/13/2022 0332   RDW 19.9 (H) 03/13/2022 0332   RDW 21.5 (H) 02/07/2018 0937   LYMPHSABS 1.8 03/12/2022 1244   MONOABS 0.4 03/12/2022 1244   EOSABS 0.1 03/12/2022 1244   BASOSABS 0.0 03/12/2022 1244    CMP     Component Value Date/Time   NA 133 (L) 03/13/2022 0332   NA 133 (L) 02/07/2018 0937   K 3.6 03/13/2022 0332   CL 95 (L) 03/13/2022 0332   CO2 21 (L) 03/13/2022 0332   GLUCOSE 170 (H) 03/13/2022 0332   GLUCOSE 262 03/06/2008 0000   BUN 55 (H) 03/13/2022 0332   BUN 37 (H) 02/07/2018 0937   CREATININE 10.55 (H) 03/13/2022 0332   CALCIUM 8.1 (L) 03/13/2022 0332   PROT 7.5 03/12/2022 1244   PROT 7.4 10/27/2021 1310   ALBUMIN 2.8 (L) 03/13/2022 0332   ALBUMIN 4.4 10/27/2021 1310   AST 30 03/12/2022 1244   ALT 21 03/12/2022 1244   ALKPHOS 127 (H) 03/12/2022 1244   BILITOT 1.5 (H) 03/12/2022 1244   BILITOT 0.4 10/27/2021 1310   GFRNONAA 6 (L) 03/13/2022 0332   GFRAA 5 (L) 03/10/2020 0734    Lipid Panel     Component Value Date/Time   CHOL 117 10/27/2021 1310   TRIG 100 10/27/2021 1310   HDL 41 10/27/2021 1310   CHOLHDL 2.9 10/27/2021 1310   CHOLHDL 5.3 10/29/2017 0240   VLDL 53 (H) 10/29/2017 0240   LDLCALC 57 10/27/2021 1310   LDLDIRECT 59 10/27/2021 1310     Imaging I have reviewed the images obtained:  CT-head-Limited with motion. No significant acute intracranial abnormality  rEEG 7/29- This study is within normal limits. No seizures or epileptiform discharges were seen throughout the recording  Assessment:  [name] Pattison Sr. is a 46 y.o. male with past medical history of HTN,  DM, ESRD on HD, bilateral BKA, PAD, CAD, s/p CABG, Chronic combined CHF,  MI, DVT, anxiety and depression and GERD and medical non compliance who presents to the [facility] Ed for evaluation of a witnessed seizure at HD. The seizure occurred one hour into the HD Session. In the ER he was noted to be acting differently and he was found with a pill bottle full of a white powdery substance that was taken away Unsure if this was a true seizure, no documentation of what happened ad HD center. Possible that he became hypotensive during HD or dialysis disequilibrium vs true seizure  Possible new onset seizures, occurred during HD   Recommendations: - continue to monitor for seizures  - Seizure precautions  - Please call neurology if patient has seizure like activity - Will not start AED's at this time - Driving restrictions  [place] Per [facility] statutes, patients with seizures are not allowed to drive until they have been seizure-free for six months.   Use caution when using heavy equipment or power tools. Avoid working on ladders or at heights. Take showers instead of baths. Ensure the water temperature is not too high on the home water heater. Do not go swimming alone. Do not lock yourself in a room alone (i.e. bathroom). When caring for infants or small children, sit down when holding, feeding, or changing them to minimize risk of injury to the child in the event you have a seizure. Maintain good sleep hygiene. Avoid alcohol.    If patient has another seizure, call 911 and bring them back to the ED if: A.  The seizure lasts longer than 5 minutes.      B.  The patient doesn't wake shortly after the seizure or has new problems such as difficulty seeing, speaking or moving following the seizure C.  The patient was injured during the seizure D.  The patient has a temperature over 102 F (39C) E.  The patient vomited during the seizure and now is having trouble breathing   Beulah Gandy  DNP, ACNPC-AG  I have seen the patient and reviewed the above note.  He had an episode concerning for seizure while in dialysis.  He thinks that his blood pressure may have been low, but given the duration of the seizure(reported as 10 minutes) I do not think that this was likely to be syncope.  Given that this is his first ever seizure, and there is a small possibility this was provoked due to blood pressure, I would not start antiepileptic therapy at this time with a negative EEG.  If he had a clear cortical area of encephalomalacia on MRI, may need to consider this, and therefore I will recommend getting an MRI.  We will follow-up MRI, if negative then no antiepileptics recommended.  I discussed with the patient that he is not allowed to drive for a period of 6 months from most recent seizure.  Roland Rack, MD [facility] [phone]  If 7pm- 7am, please page neurology on call as listed in [place].

## 2022-03-13 NOTE — Progress Notes (Signed)
EEG complete - results pending 

## 2022-03-13 NOTE — Progress Notes (Signed)
Pt BP is soft and complains that he feels light headed. The rest of vitals unremarkable. MD on coverage and RR nurse were notified.

## 2022-03-14 ENCOUNTER — Inpatient Hospital Stay (HOSPITAL_COMMUNITY): Payer: Medicaid Other

## 2022-03-14 DIAGNOSIS — R569 Unspecified convulsions: Secondary | ICD-10-CM | POA: Diagnosis not present

## 2022-03-14 LAB — GLUCOSE, CAPILLARY
Glucose-Capillary: 122 mg/dL — ABNORMAL HIGH (ref 70–99)
Glucose-Capillary: 130 mg/dL — ABNORMAL HIGH (ref 70–99)
Glucose-Capillary: 244 mg/dL — ABNORMAL HIGH (ref 70–99)
Glucose-Capillary: 323 mg/dL — ABNORMAL HIGH (ref 70–99)

## 2022-03-14 LAB — HEPATITIS B SURFACE ANTIBODY, QUANTITATIVE: Hep B S AB Quant (Post): 78.8 m[IU]/mL (ref >9.9)

## 2022-03-14 MED ORDER — LORAZEPAM 2 MG/ML IJ SOLN
1.0000 mg | Freq: Once | INTRAMUSCULAR | Status: AC | PRN
  Administered 2022-03-15: 1 mg via INTRAVENOUS
  Filled 2022-03-14: qty 1

## 2022-03-14 NOTE — Progress Notes (Signed)
PROGRESS NOTE  [name] Mika Sr.  DOB: 27-Sep-1975  PCP: Sandi Mariscal, MD HBZ:[id number]  DOA: 03/12/2022  LOS: 1 day  Hospital Day: 3  Brief narrative: [name] Labella Sr. is a 46 y.o. male with PMH significant for ESRD-HD-MWF, DM 2, HTN, CAD/CABG, PAD s/p bilateral BKA, chronic combined systolic and diastolic CHF, GERD Patient was brought to the ED after she had an episode of seizure during hemodialysis. Patient reports that he has not been feeling well throughout the week since he hasn't slept in 4 days, because of insomnia.  He felt ok at HD and fell asleep and when he awoke he was surrounded by people and was transported to the ER.   He does not think he had a seizure.  He knew where he was when he awoke.  No prior h/o seizures.  He acknowledges regular use of marijuana but denies use of other drugs.  In the ED, patient was afebrile, hemodynamically stable, alert, awake, oriented x3 Labs showed sodium of 131, platelet slightly low at 122 CT head did not show any acute intracranial abnormality Chest x-ray showed cardiomegaly, pulmonary edema and bilateral effusions Admitted to hospitalist service  Subjective: Patient was seen and examined this morning.  Sitting up in bed.  Not in distress.  Taking breakfast.  Pending MRI brain ordered by neurology.  Assessment and plan: Seizure New onset, happened during dialysis.  Patient denies any history of seizure.  Reports he has chronic insomnia and has not been able to fall asleep last 4 days.   Placed in observation overnight for further evaluation.  AED was not started because it is first seizure occurrence EEG report did not show any evidence of seizure or epileptiform discharges. Neurology consult appreciated.  MRI brain pending. Seizure precautions and restrictions   ESRD-HD-MWF Nephrology consulted.  Noted a plan of off schedule dialysis today Continue Calcitriol, Fosrenol  Uncontrolled type 2 diabetes mellitus -A1c  10.4 on 7/21 -PTA on Lantus 5 units daily, Premeal sliding scale insulin.   -Currently continued on the same. Recent Labs  Lab 03/13/22 0630 03/13/22 1600 03/13/22 2027 03/14/22 0615 03/14/22 1128  GLUCAP 203* 144* 277* 122* 130*    Chronic combined CHF Essential hypertension -PTA on Toprol, Imdur  CAD/CABG PAD/bilateral BKA -No anginal symptoms currently. -Has prosthetics at bedside -Continue aspirin, Plavix, Lipitor   Insomnia -He reports difficulty sleeping, none in 4 nights -He also reports depression associated -Denies SI/HI -Was given Ambien last night (no trazodone, Remeron due to seizures, prolonged QT)   Marijuana abuse -No longer smoking cigarettes and denies other drugs but acknowledges routine use of THC -Cessation encouraged  Goals of care   Code Status: Full Code    Mobility: Bilateral BKA but uses prosthesis  Skin assessment:     Nutritional status:  Body mass index is 23.21 kg/m.          Diet:  Diet Order             Diet renal/carb modified with fluid restriction Fluid restriction: 1200 mL Fluid; Room service appropriate? Yes; Fluid consistency: Thin  Diet effective now                   DVT prophylaxis:  heparin injection 5,000 Units Start: 03/12/22 1745   Antimicrobials: None Fluid: None Consultants: Neurology Family Communication: None at bedside  Status is: Observation  Continue in-hospital care because: Remains somnolent.  Pending MRI brain Level of care: Telemetry Medical   Dispo: The patient  is from: Home              Anticipated d/c is to: Pending MRI brain.  Hopefully home tomorrow.              Patient currently is not medically stable to d/c.   Difficult to place patient No     Infusions:     Scheduled Meds:  aspirin EC  81 mg Oral Daily   atorvastatin  80 mg Oral Daily   [START ON 03/15/2022] calcitRIOL  1.25 mcg Oral Q M,W,F-HD   Chlorhexidine Gluconate Cloth  6 each Topical Q0600   clopidogrel   75 mg Oral Daily   heparin  5,000 Units Subcutaneous Q8H   insulin aspart  0-6 Units Subcutaneous TID WC   insulin glargine-yfgn  5 Units Subcutaneous Daily   lanthanum  2,000 mg Oral TID WC   loratadine  10 mg Oral Daily   multivitamin  1 tablet Oral QHS   pantoprazole  40 mg Oral Daily   sodium chloride flush  3 mL Intravenous Q12H    PRN meds: acetaminophen **OR** acetaminophen, albuterol, calcium carbonate (dosed in mg elemental calcium), camphor-menthol **AND** hydrOXYzine, docusate sodium, feeding supplement (NEPRO CARB STEADY), hydrALAZINE, LORazepam, methocarbamol, ondansetron **OR** ondansetron (ZOFRAN) IV, sorbitol, zolpidem   Antimicrobials: Anti-infectives (From admission, onward)    None       Objective: Vitals:   03/14/22 0816 03/14/22 1125  BP: (!) 157/99 132/61  Pulse: 69 75  Resp: 16 16  Temp: 97.6 F (36.4 C) 97.7 F (36.5 C)  SpO2: 93% 99%    Intake/Output Summary (Last 24 hours) at 03/14/2022 1513 Last data filed at 03/13/2022 1759 Gross per 24 hour  Intake --  Output 4000 ml  Net -4000 ml   Filed Weights   03/13/22 1310 03/13/22 1754 03/13/22 1759  Weight: 84.7 kg 82 kg 82 kg   Weight change: -0.123 kg Body mass index is 23.21 kg/m.   Physical Exam: General exam: Middle-aged African-American male.  Not in distress Skin: No rashes, lesions or ulcers. HEENT: Atraumatic, normocephalic, no obvious bleeding Lungs: Clear to auscultation bilaterally CVS: Regular rate and rhythm, no murmur GI/Abd soft, nontender, nondistended, bowel sound present CNS: Alert, awake, speaks less.  Oriented x3  psychiatry: Sad affect Extremities: Bilateral BKA status  Data Review: I have personally reviewed the laboratory data and studies available.  F/u labs ordered Unresulted Labs (From admission, onward)     Start     Ordered   03/13/22 0950  Urine drugs of abuse scrn w alc, routine (Ref Lab)  Once,   R        03/13/22 0949   03/12/22 1643  Hepatitis B  surface antibody,quantitative  (New Admission Hemo Labs (Hepatitis B))  Once,   R        03/12/22 1643   03/12/22 1430  Drug Screen 10 W/Conf, Serum  Once,   R        03/12/22 1430            Signed, Terrilee Croak, MD [facility] 03/14/2022

## 2022-03-14 NOTE — Progress Notes (Signed)
Neurology Progress Note   S:// Patient laying in the bed in NAD. No new neurological events overnight. Exam unchanged from yesterday. Waiting on MRI brain   O:// Current vital signs: BP (!) 157/99 (BP Location: Right Arm)   Pulse 69   Temp 97.6 F (36.4 C) (Oral)   Resp 16   Ht 6\' 2"  (1.88 m)   Wt 82 kg   SpO2 93%   BMI 23.21 kg/m  Vital signs in last 24 hours: Temp:  [97.5 F (36.4 C)-98.3 F (36.8 C)] 97.6 F (36.4 C) (07/30 0816) Pulse Rate:  [63-78] 69 (07/30 0816) Resp:  [0-21] 16 (07/30 0816) BP: (87-165)/(22-120) 157/99 (07/30 0816) SpO2:  [16 %-100 %] 93 % (07/30 0816) Weight:  [82 kg-84.7 kg] 82 kg (07/29 1759) GENERAL: Awake, alert in NAD HEENT: - Normocephalic and atraumatic, dry mm LUNGS - Clear to auscultation bilaterally with no wheezes CV - S1S2 RRR, no m/r/g, equal pulses bilaterally. ABDOMEN - Soft, nontender, nondistended with normoactive BS Ext: warm, well perfused, intact peripheral pulses, no edema, bilateral BKA   NEURO:  Mental Status: AA&Ox4 Language: speech is clear.  Naming, repetition, fluency, and comprehension intact. Cranial Nerves: PERRL EOMI, visual fields full, no facial asymmetry, facial sensation intact, hearing intact, tongue/uvula/soft palate midline, normal sternocleidomastoid and trapezius muscle strength. No evidence of tongue atrophy or fibrillations Motor: 5/5 in all 4 extremities  Tone: is normal and bulk is normal Sensation- Intact to light touch bilaterally Coordination: FTN intact bilaterally, no ataxia in BLE. Gait- deferred   Medications  Current Facility-Administered Medications:    acetaminophen (TYLENOL) tablet 650 mg, 650 mg, Oral, Q6H PRN, 650 mg at 03/13/22 1042 **OR** acetaminophen (TYLENOL) suppository 650 mg, 650 mg, Rectal, Q6H PRN, Karmen Bongo, MD   albuterol (PROVENTIL) (2.5 MG/3ML) 0.083% nebulizer solution 2.5 mg, 2.5 mg, Nebulization, Q2H PRN, Karmen Bongo, MD   aspirin EC tablet 81 mg, 81 mg,  Oral, Daily, Karmen Bongo, MD, 81 mg at 03/14/22 0853   atorvastatin (LIPITOR) tablet 80 mg, 80 mg, Oral, Daily, Karmen Bongo, MD, 80 mg at 03/14/22 0853   [START ON 03/15/2022] calcitRIOL (ROCALTROL) capsule 1.25 mcg, 1.25 mcg, Oral, Q M,W,F-HD, Karmen Bongo, MD   calcium carbonate (dosed in mg elemental calcium) suspension 500 mg of elemental calcium, 500 mg of elemental calcium, Oral, Q6H PRN, Karmen Bongo, MD   camphor-menthol [facility]) lotion 1 Application, 1 Application, Topical, I4P PRN **AND** hydrOXYzine (ATARAX) tablet 25 mg, 25 mg, Oral, Q8H PRN, Karmen Bongo, MD   Chlorhexidine Gluconate Cloth 2 % PADS 6 each, 6 each, Topical, Q0600, Karmen Bongo, MD   clopidogrel (PLAVIX) tablet 75 mg, 75 mg, Oral, Daily, Karmen Bongo, MD, 75 mg at 03/14/22 0853   docusate sodium (ENEMEEZ) enema 283 mg, 1 enema, Rectal, PRN, Karmen Bongo, MD   feeding supplement (NEPRO CARB STEADY) liquid 237 mL, 237 mL, Oral, TID PRN, Karmen Bongo, MD   heparin injection 5,000 Units, 5,000 Units, Subcutaneous, Q8H, Karmen Bongo, MD, 5,000 Units at 03/13/22 2154   hydrALAZINE (APRESOLINE) injection 5 mg, 5 mg, Intravenous, Q4H PRN, Karmen Bongo, MD   insulin aspart (novoLOG) injection 0-6 Units, 0-6 Units, Subcutaneous, TID WC, Karmen Bongo, MD, 3 Units at 03/13/22 2100   insulin glargine-yfgn [facility]) injection 5 Units, 5 Units, Subcutaneous, Daily, Karmen Bongo, MD, 5 Units at 03/13/22 1045   lanthanum (FOSRENOL) chewable tablet 2,000 mg, 2,000 mg, Oral, TID WC, Karmen Bongo, MD, 2,000 mg at 03/13/22 1834   loratadine (CLARITIN) tablet 10 mg,  10 mg, Oral, Daily, Karmen Bongo, MD, 10 mg at 03/14/22 0854   LORazepam (ATIVAN) injection 4 mg, 4 mg, Intravenous, Q5 Min x 2 PRN, Karmen Bongo, MD   methocarbamol (ROBAXIN) tablet 1,000 mg, 1,000 mg, Oral, QID PRN, Karmen Bongo, MD, 1,000 mg at 03/13/22 1041   multivitamin (RENA-VIT) tablet 1 tablet, 1 tablet, Oral, QHS, Karmen Bongo, MD, 1 tablet at 03/13/22 2154   ondansetron (ZOFRAN) tablet 4 mg, 4 mg, Oral, Q6H PRN **OR** ondansetron (ZOFRAN) injection 4 mg, 4 mg, Intravenous, Q6H PRN, Karmen Bongo, MD   pantoprazole (PROTONIX) EC tablet 40 mg, 40 mg, Oral, Daily, Karmen Bongo, MD, 40 mg at 03/14/22 0853   sodium chloride flush (NS) 0.9 % injection 3 mL, 3 mL, Intravenous, Q12H, Karmen Bongo, MD, 3 mL at 03/14/22 0855   sorbitol 70 % solution 30 mL, 30 mL, Oral, PRN, Karmen Bongo, MD   zolpidem Lorrin Mais) tablet 5 mg, 5 mg, Oral, QHS PRN, Karmen Bongo, MD Labs CBC    Component Value Date/Time   WBC 4.6 03/13/2022 0332   RBC 4.82 03/13/2022 0332   HGB 13.6 03/13/2022 0332   HGB 14.3 02/07/2018 0937   HCT 39.6 03/13/2022 0332   HCT 42.8 02/07/2018 0937   PLT 116 (L) 03/13/2022 0332   PLT 369 02/07/2018 0937   MCV 82.2 03/13/2022 0332   MCV 82 02/07/2018 0937   MCH 28.2 03/13/2022 0332   MCHC 34.3 03/13/2022 0332   RDW 19.9 (H) 03/13/2022 0332   RDW 21.5 (H) 02/07/2018 0937   LYMPHSABS 1.8 03/12/2022 1244   MONOABS 0.4 03/12/2022 1244   EOSABS 0.1 03/12/2022 1244   BASOSABS 0.0 03/12/2022 1244    CMP     Component Value Date/Time   NA 133 (L) 03/13/2022 0332   NA 133 (L) 02/07/2018 0937   K 3.6 03/13/2022 0332   CL 95 (L) 03/13/2022 0332   CO2 21 (L) 03/13/2022 0332   GLUCOSE 170 (H) 03/13/2022 0332   GLUCOSE 262 03/06/2008 0000   BUN 55 (H) 03/13/2022 0332   BUN 37 (H) 02/07/2018 0937   CREATININE 10.55 (H) 03/13/2022 0332   CALCIUM 8.1 (L) 03/13/2022 0332   PROT 7.5 03/12/2022 1244   PROT 7.4 10/27/2021 1310   ALBUMIN 2.8 (L) 03/13/2022 0332   ALBUMIN 4.4 10/27/2021 1310   AST 30 03/12/2022 1244   ALT 21 03/12/2022 1244   ALKPHOS 127 (H) 03/12/2022 1244   BILITOT 1.5 (H) 03/12/2022 1244   BILITOT 0.4 10/27/2021 1310   GFRNONAA 6 (L) 03/13/2022 0332   GFRAA 5 (L) 03/10/2020 0734    glycosylated hemoglobin  Lipid Panel     Component Value Date/Time   CHOL 117  10/27/2021 1310   TRIG 100 10/27/2021 1310   HDL 41 10/27/2021 1310   CHOLHDL 2.9 10/27/2021 1310   CHOLHDL 5.3 10/29/2017 0240   VLDL 53 (H) 10/29/2017 0240   LDLCALC 57 10/27/2021 1310   LDLDIRECT 59 10/27/2021 1310     Imaging I have reviewed images in epic and the results pertinent to this consultation are:  CT-head-Limited with motion. No significant acute intracranial abnormality   rEEG 7/29- This study is within normal limits. No seizures or epileptiform discharges were seen throughout the recording    Assessment:  [name] Riffe Sr. is a 46 y.o. male with past medical history of HTN, DM, ESRD on HD, bilateral BKA, PAD, CAD, s/p CABG, Chronic combined CHF, MI, DVT, anxiety and depression and GERD and medical  non compliance who presents to the [facility] for evaluation of a witnessed seizure at HD. The seizure occurred one hour into the HD Session. In the ER he was noted to be acting differently and he was found with a pill bottle full of a white powdery substance that was taken away Unsure if this was a true seizure, no documentation of what happened ad HD center. Possible that he became hypotensive during HD or dialysis disequilibrium vs true seizure  Impression: Possible new onset seizures, occurred during HD   Recommendations: - MRI Brain (already ordered) - continue to monitor for seizures  - Seizure precautions  - Please call neurology if patient has seizure like activity - Will not start AED's at this time - Driving restrictions   [place] Per [facility] statutes, patients with seizures are not allowed to drive until they have been seizure-free for six months.   Use caution when using heavy equipment or power tools. Avoid working on ladders or at heights. Take showers instead of baths. Ensure the water temperature is not too high on the home water heater. Do not go swimming alone. Do not lock yourself in a room alone (i.e. bathroom). When caring  for infants or small children, sit down when holding, feeding, or changing them to minimize risk of injury to the child in the event you have a seizure. Maintain good sleep hygiene. Avoid alcohol.    If patient has another seizure, call 911 and bring them back to the ED if: A.  The seizure lasts longer than 5 minutes.      B.  The patient doesn't wake shortly after the seizure or has new problems such as difficulty seeing, speaking or moving following the seizure C.  The patient was injured during the seizure D.  The patient has a temperature over 102 F (39C) E.  The patient vomited during the seizure and now is having trouble breathing   Beulah Gandy DNP, ACNPC-AG

## 2022-03-14 NOTE — Progress Notes (Signed)
[facility] Progress Note   Subjective:    46 Seen and examined patient at bedside. No acute issues. Plan for HD 7/31.  Objective Vitals:   03/13/22 2323 03/14/22 0500 03/14/22 0816 03/14/22 1125  BP: (!) 127/36 (!) 144/46 (!) 157/99 132/61  Pulse: 66 68 69 75  Resp: 16 18 16 16   Temp: 97.9 F (36.6 C) 98.1 F (36.7 C) 97.6 F (36.4 C) 97.7 F (36.5 C)  TempSrc: Oral Oral Oral Oral  SpO2: 99% 98% 93% 99%  Weight:      Height:       Physical Exam General: Awake, alert, NAD Heart: S1 and S2; No M/G/R, RRR Lungs: Clear bilaterally Abdomen: Soft and non-tender Extremities: BL BKA; No edema BL thighs Dialysis Access: AVF (+) B/T   Filed Weights   03/13/22 1310 03/13/22 1754 03/13/22 1759  Weight: 84.7 kg 82 kg 82 kg    Intake/Output Summary (Last 24 hours) at 03/14/2022 1312 Last data filed at 03/13/2022 1759 Gross per 24 hour  Intake --  Output 4000 ml  Net -4000 ml    Additional Objective Labs: Basic Metabolic Panel: Recent Labs  Lab 03/12/22 1244 03/13/22 0332  NA 131* 133*  K 3.6 3.6  CL 93* 95*  CO2 22 21*  GLUCOSE 159* 170*  BUN 45* 55*  CREATININE 9.55* 10.55*  CALCIUM 8.2* 8.1*  PHOS  --  6.6*   Liver Function Tests: Recent Labs  Lab 03/12/22 1244 03/13/22 0332  AST 30  --   ALT 21  --   ALKPHOS 127*  --   BILITOT 1.5*  --   PROT 7.5  --   ALBUMIN 3.2* 2.8*   No results for input(s): "LIPASE", "AMYLASE" in the last 168 hours. CBC: Recent Labs  Lab 03/12/22 1244 03/13/22 0332  WBC 4.8 4.6  NEUTROABS 2.5  --   HGB 13.7 13.6  HCT 40.6 39.6  MCV 81.7 82.2  PLT 122* 116*   Blood Culture    Component Value Date/Time   SDES BLOOD RIGHT ARM 11/04/2018 1007   SPECREQUEST  11/04/2018 1007    BOTTLES DRAWN AEROBIC ONLY Blood Culture adequate volume   CULT  11/04/2018 1007    NO GROWTH 5 DAYS Performed at [facility], [place] [address], [place], [place] [address]    REPTSTATUS 11/09/2018 FINAL 11/04/2018 1007     Cardiac Enzymes: No results for input(s): "CKTOTAL", "CKMB", "CKMBINDEX", "TROPONINI" in the last 168 hours. CBG: Recent Labs  Lab 03/13/22 0630 03/13/22 1600 03/13/22 2027 03/14/22 0615 03/14/22 1128  GLUCAP 203* 144* 277* 122* 130*   Iron Studies: No results for input(s): "IRON", "TIBC", "TRANSFERRIN", "FERRITIN" in the last 72 hours. Lab Results  Component Value Date   INR 1.1 08/01/2021   INR 1.25 11/04/2017   INR 1.00 10/30/2017   Studies/Results: EEG adult now  Result Date: 03/13/2022 Lora Havens, MD     03/13/2022 12:36 PM Patient Name: [name] Halsted [facility] Sr. MRN: [id number] Epilepsy Attending: Lora Havens Referring Physician/Provider: Karmen Bongo, MD Date: 03/13/2022 Duration: 22.12 mins Patient history: 46 yo M patient without known h/o seizures presenting with concern for acute seizure during HD. EEG to evaluate for seizure Level of alertness: Awake AEDs during EEG study: None Technical aspects: This EEG study was done with scalp electrodes positioned according to the 10-20 International system of electrode placement. Electrical activity was reviewed with band pass filter of 1-70Hz , sensitivity of 7 uV/mm, display speed of 54mm/sec with a  60Hz  notched filter applied as appropriate. EEG data were recorded continuously and digitally stored.  Video monitoring was available and reviewed as appropriate. Description: The posterior dominant rhythm consists of 7.5 Hz activity of moderate voltage (25-35 uV) seen predominantly in posterior head regions, symmetric and reactive to eye opening and eye closing. Hyperventilation and photic stimulation were not performed.  IMPRESSION: This study is within normal limits. No seizures or epileptiform discharges were seen throughout the recording. A normal interictal EEG does not exclude nor support the diagnosis of epilepsy. Lora Havens   CT Head Wo Contrast  Result Date: 03/12/2022 CLINICAL DATA:  Dialysis patient, concern  for seizure activity EXAM: CT HEAD WITHOUT CONTRAST TECHNIQUE: Contiguous axial images were obtained from the base of the skull through the vertex without intravenous contrast. RADIATION DOSE REDUCTION: This exam was performed according to the departmental dose-optimization program which includes automated exposure control, adjustment of the mA and/or kV according to patient size and/or use of iterative reconstruction technique. COMPARISON:  02/22/2006 FINDINGS: Brain: Limited with motion artifact. No definite acute intracranial hemorrhage, mass lesion, new infarction, midline shift, herniation, or hydrocephalus. No extra-axial fluid collection. No focal mass effect or edema. Cisterns are patent. No cerebellar abnormality. Vascular: No hyperdense vessel or unexpected calcification. Skull: Also limited with motion. No acute abnormality. Mastoids are clear. Sinuses/Orbits: Scattered chronic sinus mucosal thickening worse in the right maxillary sinus. No sinus air-fluid level. Orbits unremarkable. Other: None. IMPRESSION: Limited with motion. No significant acute intracranial abnormality by noncontrast CT. Electronically Signed   By: Jerilynn Mages.  Shick M.D.   On: 03/12/2022 13:40   DG Chest 2 View  Result Date: 03/12/2022 CLINICAL DATA:  Cough post seizures today. EXAM: CHEST - 2 VIEW COMPARISON:  Radiographs 12/06/2021.  CT 07/17/2009. FINDINGS: Suboptimal inspiration, especially on the lateral view. 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 There is vascular congestion with small pleural effusions, mild edema and probable left lower lobe atelectasis. No pneumothorax. The bones appear unremarkable. Telemetry leads overlie the chest. IMPRESSION: Similar radiographic appearance of the chest with cardiomegaly, edema and bilateral pleural effusions suspicious for congestive heart failure. Electronically Signed   By: Richardean Sale M.D.   On: 03/12/2022 13:36    Medications:   aspirin EC  81 mg Oral Daily    atorvastatin  80 mg Oral Daily   [START ON 03/15/2022] calcitRIOL  1.25 mcg Oral Q M,W,F-HD   Chlorhexidine Gluconate Cloth  6 each Topical Q0600   clopidogrel  75 mg Oral Daily   heparin  5,000 Units Subcutaneous Q8H   insulin aspart  0-6 Units Subcutaneous TID WC   insulin glargine-yfgn  5 Units Subcutaneous Daily   lanthanum  2,000 mg Oral TID WC   loratadine  10 mg Oral Daily   multivitamin  1 tablet Oral QHS   pantoprazole  40 mg Oral Daily   sodium chloride flush  3 mL Intravenous Q12H    Dialysis Orders: [facility] MWF 3h 35min  75.5kg  P2  2K/Ca3.5 bath  AVF  Heparin yes - calcitriol 1 ug mwf po - last > Hb 13, pth 264, phos 5.7  Assessment/Plan: Seizures - new onset, happened during HD.  Possibly dialysis equilibrium but certainly is not the first time he has missed HD and BUN is not very high here so dont think that is in play.  His BP was lower than it usually is for him. CTH was negative. Neurology consulting.  ESRD - normally MWF via AVF. Missed Wed, and  only got an hour on Friday.  Next HD on 7/31 to get back on schedule.  Hypertension - BP lower than usual, meds changed to toprol and imdur added. Hold meds for now as we need some BP in order to achieve UF with HD.  Anemia of ESRD:  Hb has been above goal, not on ESA Metabolic Bone Disease - cont calcitriol with HD and also fosrenol for phos binding    [name] Poet, NP [place] 03/14/2022,1:12 PM  LOS: 1 day

## 2022-03-15 DIAGNOSIS — E785 Hyperlipidemia, unspecified: Secondary | ICD-10-CM | POA: Diagnosis not present

## 2022-03-15 DIAGNOSIS — N186 End stage renal disease: Secondary | ICD-10-CM | POA: Diagnosis not present

## 2022-03-15 DIAGNOSIS — Z89511 Acquired absence of right leg below knee: Secondary | ICD-10-CM

## 2022-03-15 DIAGNOSIS — Z89512 Acquired absence of left leg below knee: Secondary | ICD-10-CM

## 2022-03-15 DIAGNOSIS — I10 Essential (primary) hypertension: Secondary | ICD-10-CM | POA: Diagnosis not present

## 2022-03-15 DIAGNOSIS — Z951 Presence of aortocoronary bypass graft: Secondary | ICD-10-CM

## 2022-03-15 DIAGNOSIS — F1210 Cannabis abuse, uncomplicated: Secondary | ICD-10-CM

## 2022-03-15 DIAGNOSIS — R569 Unspecified convulsions: Secondary | ICD-10-CM | POA: Diagnosis not present

## 2022-03-15 DIAGNOSIS — Z992 Dependence on renal dialysis: Secondary | ICD-10-CM

## 2022-03-15 LAB — GLUCOSE, CAPILLARY
Glucose-Capillary: 266 mg/dL — ABNORMAL HIGH (ref 70–99)
Glucose-Capillary: 218 mg/dL — ABNORMAL HIGH (ref 70–99)

## 2022-03-15 NOTE — TOC CAGE-AID Note (Signed)
Transition of Care [facility]) - CAGE-AID Screening   Patient Details  Name: [name] Higinbotham Sr. MRN: [id number] Date of Birth: 06-27-1976  Transition of Care [facility]) CM/SW Contact:    Pollie Friar, RN Phone Number: 03/15/2022, 3:08 PM   Clinical Narrative: Pt affirms that he smokes THC and states he is going to continue. He refuses any counseling resources.    CAGE-AID Screening:    Have You Ever Felt You Ought to Cut Down on Your Drinking or Drug Use?: No Have People Annoyed You By Critizing Your Drinking Or Drug Use?: No Have You Felt Bad Or Guilty About Your Drinking Or Drug Use?: No Have You Ever Had a Drink or Used Drugs First Thing In The Morning to Steady Your Nerves or to Get Rid of a Hangover?: No CAGE-AID Score: 0  Substance Abuse Education Offered: Yes (pt refused)

## 2022-03-15 NOTE — Discharge Instructions (Signed)
SEIZURE PRECAUTIONS Per [facility] statutes, patients with seizures are not allowed to drive until they have been seizure-free for six months.    Use caution when using heavy equipment or power tools. Avoid working on ladders or at heights. Take showers instead of baths. Ensure the water temperature is not too high on the home water heater. Do not go swimming alone. Do not lock yourself in a room alone (i.e. bathroom). When caring for infants or small children, sit down when holding, feeding, or changing them to minimize risk of injury to the child in the event you have a seizure. Maintain good sleep hygiene. Avoid alcohol.    If patient has another seizure, call 911 and bring them back to the ED if: A.  The seizure lasts longer than 5 minutes.      B.  The patient doesn't wake shortly after the seizure or has new problems such as difficulty seeing, speaking or moving following the seizure C.  The patient was injured during the seizure D.  The patient has a temperature over 102 F (39C) E.  The patient vomited during the seizure and now is having trouble breathing     IMPORTANT INFORMATION: PAY CLOSE ATTENTION   PHYSICIAN DISCHARGE INSTRUCTIONS  Follow with Primary care provider  Sandi Mariscal, MD  and other consultants as instructed by your Hospitalist Physician  [place], WORSEN OR NEW PROBLEM DEVELOPS   Please note: You were cared for by a hospitalist during your hospital stay. Every effort will be made to forward records to your primary care provider.  You can request that your primary care provider send for your hospital records if they have not received them.  Once you are discharged, your primary care physician will handle any further medical issues. Please note that NO REFILLS for any discharge medications will be authorized once you are discharged, as it is imperative that you return to your primary care physician (or  establish a relationship with a primary care physician if you do not have one) for your post hospital discharge needs so that they can reassess your need for medications and monitor your lab values.  Please get a complete blood count and chemistry panel checked by your Primary MD at your next visit, and again as instructed by your Primary MD.  Get Medicines reviewed and adjusted: Please take all your medications with you for your next visit with your Primary MD  Laboratory/radiological data: Please request your Primary MD to go over all hospital tests and procedure/radiological results at the follow up, please ask your primary care provider to get all Hospital records sent to his/her office.  In some cases, they will be blood work, cultures and biopsy results pending at the time of your discharge. Please request that your primary care provider follow up on these results.  If you are diabetic, please bring your blood sugar readings with you to your follow up appointment with primary care.    Please call and make your follow up appointments as soon as possible.    Also Note the following: If you experience worsening of your admission symptoms, develop shortness of breath, life threatening emergency, suicidal or homicidal thoughts you must seek medical attention immediately by calling 911 or calling your MD immediately  if symptoms less severe.  You must read complete instructions/literature along with all the possible adverse reactions/side effects for all the Medicines you take and that have  been prescribed to you. Take any new Medicines after you have completely understood and accpet all the possible adverse reactions/side effects.   Do not drive when taking Pain medications or sleeping medications (Benzodiazepines)  Do not take more than prescribed Pain, Sleep and Anxiety Medications. It is not advisable to combine anxiety,sleep and pain medications without talking with your primary care  practitioner  Special Instructions: If you have smoked or chewed Tobacco  in the last 2 yrs please stop smoking, stop any regular Alcohol  and or any Recreational drug use.  Wear Seat belts while driving.  Do not drive if taking any narcotic, mind altering or controlled substances or recreational drugs or alcohol.

## 2022-03-15 NOTE — Plan of Care (Signed)
  Problem: Clinical Measurements: Goal: Ability to maintain clinical measurements within normal limits will improve Outcome: Progressing   Problem: Activity: Goal: Risk for activity intolerance will decrease Outcome: Progressing   Problem: Nutrition: Goal: Adequate nutrition will be maintained Outcome: Progressing   Problem: Coping: Goal: Level of anxiety will decrease Outcome: Progressing   Problem: Safety: Goal: Ability to remain free from injury will improve Outcome: Progressing   

## 2022-03-15 NOTE — Progress Notes (Signed)
Wrong entry

## 2022-03-15 NOTE — Progress Notes (Signed)
UF turned off due to low BP, will continue to monitor, RN aware

## 2022-03-15 NOTE — Progress Notes (Signed)
Pt refused to hold still and yelling "I told them this how I act in this machine." Pt was cussing at me and refusing exam and yelling profanity. Only able to obtain 2 series of the exam, with motion. Pt was sent back to his room.

## 2022-03-15 NOTE — TOC Transition Note (Signed)
Transition of Care [facility]) - CM/SW Discharge Note   Patient Details  Name: [name] Spivack Sr. MRN: [id number] Date of Birth: 04/20/1976  Transition of Care [facility]) CM/SW Contact:  Pollie Friar, RN Phone Number: 03/15/2022, 3:10 PM   Clinical Narrative:    Pt is discharging home with self care. He has his prosthesis by the bedside and his rollator.  No new medications. Pt states he has transportation home.    Final next level of care: Home/Self Care Barriers to Discharge: No Barriers Identified   Patient Goals and CMS Choice        Discharge Placement                       Discharge Plan and Services                                     Social Determinants of Health (SDOH) Interventions     Readmission Risk Interventions     No data to display

## 2022-03-15 NOTE — Progress Notes (Signed)
[facility] NEPHROLOGY PROGRESS NOTE  Assessment/ Plan: Dialysis Orders: [facility] MWF 3h 68min  75.5kg  P2  2K/Ca3.5 bath  AVF  Heparin yes - calcitriol 1 ug mwf po  # Seizures - new onset, happened during HD.  Seen by neurology.  True seizure versus hypotensive during HD or dialysis disequilibrium.  Now seizure-free.  # ESRD - normally MWF via AVF.  Regular dialysis today.   # Hypertension/Volume: Antihypertensive on hold because of hypotension.  BP acceptable now.  UF as tolerated.   #Anemia of ESRD:  Hb has been above goal, not on ESA.  #Secondary hyperparathyroidism/CKD-MBD: cont calcitriol with HD and also fosrenol for phos binding.  Monitor lab.  Subjective: Seen and examined at bedside.  Denies nausea, vomiting, chest pain, shortness of breath.  No seizure attack.  No new event. Objective Vital signs in last 24 hours: Vitals:   03/15/22 0850 03/15/22 0854 03/15/22 0906 03/15/22 0931  BP: (!) 145/58  130/60 (!) 141/81  Pulse: 86  83 83  Resp: 19  17 (!) 22  Temp: 97.8 F (36.6 C)     TempSrc: Oral     SpO2: 100%  100% 100%  Weight:  85.3 kg    Height:       Weight change: 6 kg  Intake/Output Summary (Last 24 hours) at 03/15/2022 0955 Last data filed at 03/15/2022 0000 Gross per 24 hour  Intake 240 ml  Output --  Net 240 ml       Labs: RENAL PANEL Recent Labs    07/31/21 0041 08/01/21 0446 08/02/21 0905 08/03/21 1013 08/03/21 2047 10/27/21 1310 12/06/21 2350 03/12/22 1244 03/13/22 0332  NA 128* 132*  --  130* 132*  --  132* 131* 133*  K 3.3* 3.9  --  3.8 3.9  --  3.4* 3.6 3.6  CL 90* 94*  --  92* 96*  --  95* 93* 95*  CO2 25 21*  --  22 28  --  21* 22 21*  GLUCOSE 562* 97  --  76 179*  --  258* 159* 170*  BUN 94* 140*  --  89* 35*  --  41* 45* 55*  CREATININE 9.65* 11.85*  --  10.18* 5.79*  --  11.73* 9.55* 10.55*  CALCIUM 8.5* 8.3*  --  7.9* 7.8*  --  8.3* 8.2* 8.1*  PHOS  --   --  3.4 5.0* 3.4  --   --   --  6.6*  ALBUMIN 2.9*  --    --  3.1* 3.0* 4.4 3.3* 3.2* 2.8*     Liver Function Tests: Recent Labs  Lab 03/12/22 1244 03/13/22 0332  AST 30  --   ALT 21  --   ALKPHOS 127*  --   BILITOT 1.5*  --   PROT 7.5  --   ALBUMIN 3.2* 2.8*   No results for input(s): "LIPASE", "AMYLASE" in the last 168 hours. No results for input(s): "AMMONIA" in the last 168 hours. CBC: Recent Labs    07/31/21 1051 07/31/21 1353 07/31/21 2149 08/03/21 2047 08/04/21 0909 12/06/21 2350 03/12/22 1244 03/13/22 0332  HGB  --   --    < > 8.8* 8.4* 9.4* 13.7 13.6  MCV  --   --    < >  --  85.5 85.3 81.7 82.2  VITAMINB12  --  400  --   --   --   --   --   --   FOLATE  --  16.6  --   --   --   --   --   --   FERRITIN  --  66  --   --   --   --   --   --   TIBC  --  225*  --   --   --   --   --   --   IRON  --  126  --   --   --   --   --   --   RETICCTPCT 2.1  --   --   --   --   --   --   --    < > = values in this interval not displayed.    Cardiac Enzymes: No results for input(s): "CKTOTAL", "CKMB", "CKMBINDEX", "TROPONINI" in the last 168 hours. CBG: Recent Labs  Lab 03/14/22 1128 03/14/22 1644 03/14/22 2140 03/15/22 0629 03/15/22 0740  GLUCAP 130* 244* 323* 266* 218*    Iron Studies: No results for input(s): "IRON", "TIBC", "TRANSFERRIN", "FERRITIN" in the last 72 hours. Studies/Results: MR BRAIN WO CONTRAST  Result Date: 03/15/2022 CLINICAL DATA:  Initial evaluation for seizure. EXAM: MRI HEAD WITHOUT CONTRAST TECHNIQUE: Multiplanar, multiecho pulse sequences of the brain and surrounding structures were obtained without intravenous contrast. COMPARISON:  Prior CT from 03/12/2022. FINDINGS: Brain: Examination is technically limited as the patient was unable to tolerate the exam. Axial and coronal DWI sequences only were performed. Additionally, provided images are degraded by motion artifact. Diffusion-weighted imaging demonstrates no evidence for acute or subacute ischemia or changes related to recent seizure. No  visible areas of chronic cortical infarction. No visible mass lesion, mass effect or midline shift. Ventricles normal size without hydrocephalus. No extra-axial fluid collection. Vascular: Not well assessed on this limited exam. Skull and upper cervical spine: Not well assessed on this limited exam. Sinuses/Orbits: Not well assessed on this limited exam. Other: None. IMPRESSION: 1. Technically limited study due to patient's inability to tolerate the exam. Axial and coronal DWI sequences only were performed. 2. No acute intracranial infarct or changes related to acute/recent seizure. No other definite acute intracranial abnormality. Electronically Signed   By: Jeannine Boga M.D.   On: 03/15/2022 04:13    Medications: Infusions:   Scheduled Medications:  aspirin EC  81 mg Oral Daily   atorvastatin  80 mg Oral Daily   calcitRIOL  1.25 mcg Oral Q M,W,F-HD   Chlorhexidine Gluconate Cloth  6 each Topical Q0600   clopidogrel  75 mg Oral Daily   heparin  5,000 Units Subcutaneous Q8H   insulin aspart  0-6 Units Subcutaneous TID WC   insulin glargine-yfgn  5 Units Subcutaneous Daily   lanthanum  2,000 mg Oral TID WC   loratadine  10 mg Oral Daily   multivitamin  1 tablet Oral QHS   pantoprazole  40 mg Oral Daily   sodium chloride flush  3 mL Intravenous Q12H    have reviewed scheduled and prn medications.  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  [name] 03/15/2022,9:55 AM  LOS: 2 days

## 2022-03-15 NOTE — Discharge Summary (Signed)
Physician Discharge Summary  [name] Enis Wayne Sr. [id number]:[id number] DOB: 1975/10/28 DOA: 03/12/2022  PCP: Sandi Mariscal, MD  Admit date: 03/12/2022 Discharge date: 03/15/2022  Admitted From:  Home  Disposition: Home   Recommendations for Outpatient Follow-up:  Follow up with PCP in 1 weeks Follow up with [facility] Neurology in 2-4 weeks Seizure Precautions : no driving for 6 months  Please resume regular hemodialysis schedule   Discharge Condition: STABLE   CODE STATUS: Full  DIET: renal/carb modified   Brief Hospitalization Summary: Please see all hospital notes, images, labs for full details of the hospitalization.  46 y.o. male with medical history significant of CAD; chronic combined CHF; ESRD on HD; HTN; DM; and PAD s/p B BKA presenting with a seizure during HD.  He reports that he has been feeling somewhat ill throughout the week since he hasn't slept in 4 days, has difficulty with insomnia.  He felt ok at HD and fell asleep and when he awoke he was surrounded by people and was transported to the ER.   He does not think he had a seizure.  He knew where he was when he awoke.  No prior h/o seizures.  He acknowledges regular use of marijuana but denies use of other drugs.   ER Course:  Seizure during HD.  Alert on arrival. Fluid overload on CXR.  Nephrology will consult.  Head CT ok.  No concern for meningitis.  Possible ingestion.  Serum drug screen ordered.  Hospital Course by Problem   Seizure of undetermined cause New onset, happened during dialysis.  Patient denies any history of seizure.  Reports he has chronic insomnia and has not been able to fall asleep last 4 days.   Placed in observation overnight for further evaluation.  AED was not started because it is first seizure occurrence.  He was seen by neurology.  MRI was unrevealing.  No further inpatient work up recommended.  Pt can discharge home today per neuro with outpatient follow up arranged.  EEG report did not show any  evidence of seizure or epileptiform discharges. Neurology consult appreciated.  MRI brain unrevealing for any potential causes of seizure.  Seizure precautions and restrictions were counseled with patient and he verbalized understanding.    ESRD-HD-MWF Nephrology consulted.  Noted a plan of off schedule dialysis today Continue Calcitriol, Fosrenol   Uncontrolled type 2 diabetes mellitus -A1c 10.4 on 7/21 -resume home treatment program  Last Labs          Recent Labs  Lab 03/13/22 0630 03/13/22 1600 03/13/22 2027 03/14/22 0615 03/14/22 1128  GLUCAP 203* 144* 277* 122* 130*       Chronic combined CHF Essential hypertension -PTA on Toprol, Imdur   CAD/CABG PAD/bilateral BKA -No anginal symptoms currently. -Has prosthetics at bedside -Continue aspirin, Plavix, Lipitor   Insomnia -He reports difficulty sleeping, none in 4 nights -He also reports depression associated -Denies SI/HI   Marijuana abuse -No longer smoking cigarettes and denies other drugs but acknowledges routine use of THC -Cessation encouraged   Discharge Diagnoses:  Principal Problem:   Seizure ([place]) Active Problems:   ESRD on dialysis ([place])   Essential hypertension   S/P bilateral BKA (below knee amputation) ([place])   S/P CABG x 3   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[place])   Hyperlipidemia LDL goal <70   Marijuana abuse   Insomnia   Discharge Instructions: Discharge Instructions     Ambulatory referral to Neurology   Complete by: As directed  An appointment is requested in approximately: 4-6 wks      Allergies as of 03/15/2022       Reactions   Coconut (cocos Nucifera) Anaphylaxis, Other (See Comments)   Can use topically, allergic to coconut foods         Medication List     STOP taking these medications    insulin aspart 100 UNIT/ML FlexPen Commonly known as: NOVOLOG   insulin glargine 100 UNIT/ML Solostar Pen Commonly known as: LANTUS   lanthanum 1000 MG chewable  tablet Commonly known as: FOSRENOL   pantoprazole 40 MG tablet Commonly known as: Protonix       TAKE these medications    acetaminophen 325 MG tablet Commonly known as: TYLENOL Take 2 tablets (650 mg total) by mouth every 4 (four) hours as needed for headache or mild pain.   aspirin EC 81 MG tablet Take 1 tablet (81 mg total) by mouth daily.   atorvastatin 80 MG tablet Commonly known as: LIPITOR Take 1 tablet (80 mg total) by mouth daily.   Blood Pressure Monitor/S Cuff Misc Check blood pressure every day   calcitRIOL 0.25 MCG capsule Commonly known as: ROCALTROL Take 5 capsules (1.25 mcg total) by mouth every Monday, Wednesday, and Friday with hemodialysis.   calcium carbonate 500 MG chewable tablet Commonly known as: TUMS - dosed in mg elemental calcium Chew 500 mg by mouth daily.   clopidogrel 75 MG tablet Commonly known as: Plavix Take 1 tablet (75 mg total) by mouth daily.   Darbepoetin Alfa 200 MCG/0.4ML Sosy injection Commonly known as: ARANESP Inject 0.4 mLs (200 mcg total) into the vein every Monday with hemodialysis.   esomeprazole 40 MG capsule Commonly known as: NEXIUM Take 40 mg by mouth daily at 12 noon.   isosorbide mononitrate 30 MG 24 hr tablet Commonly known as: IMDUR Take 1 tablet (30 mg total) by mouth daily.   loratadine 10 MG tablet Commonly known as: CLARITIN Take 10 mg by mouth daily.   methocarbamol 500 MG tablet Commonly known as: ROBAXIN Take 1,000 mg by mouth 4 (four) times daily as needed for muscle spasms.   metoprolol succinate 25 MG 24 hr tablet Commonly known as: Toprol XL Take 1 tablet (25 mg total) by mouth daily.   multivitamin Tabs tablet Take 1 tablet by mouth at bedtime.   nitroGLYCERIN 0.4 MG SL tablet Commonly known as: NITROSTAT Place 1 tablet (0.4 mg total) under the tongue every 5 (five) minutes as needed for chest pain.   SEVELAMER HCL PO Take by mouth in the morning, at noon, and at bedtime.         Follow-up Information     Sandi Mariscal, MD. Schedule an appointment as soon as possible for a visit in 1 week(s).   Specialty: Internal Medicine Why: Hospital Follow Up Contact information: [place] [id number] [phone]         Josue Hector, MD. Schedule an appointment as soon as possible for a visit in 1 month(s).   Specialty: Cardiology Why: Hospital Follow Up Contact information: [address] [place] [address] [phone]         [place] Schedule an appointment as soon as possible for a visit in 2 week(s).   Why: Hospital Follow Up Contact information: [address]     [place] [phone]               Allergies  Allergen Reactions   Coconut (Cocos Nucifera) Anaphylaxis and Other (See Comments)    Can use topically, allergic to coconut foods    Allergies as of 03/15/2022       Reactions   Coconut (cocos Nucifera) Anaphylaxis, Other (See Comments)   Can use topically, allergic to coconut foods         Medication List     STOP taking these medications    insulin aspart 100 UNIT/ML FlexPen Commonly known as: NOVOLOG   insulin glargine 100 UNIT/ML Solostar Pen Commonly known as: LANTUS   lanthanum 1000 MG chewable tablet Commonly known as: FOSRENOL   pantoprazole 40 MG tablet Commonly known as: Protonix       TAKE these medications    acetaminophen 325 MG tablet Commonly known as: TYLENOL Take 2 tablets (650 mg total) by mouth every 4 (four) hours as needed for headache or mild pain.   aspirin EC 81 MG tablet Take 1 tablet (81 mg total) by mouth daily.   atorvastatin 80 MG tablet Commonly known as: LIPITOR Take 1 tablet (80 mg total) by mouth daily.   Blood Pressure Monitor/S Cuff Misc Check blood pressure every day   calcitRIOL 0.25 MCG capsule Commonly known as: ROCALTROL Take 5 capsules (1.25 mcg total) by  mouth every Monday, Wednesday, and Friday with hemodialysis.   calcium carbonate 500 MG chewable tablet Commonly known as: TUMS - dosed in mg elemental calcium Chew 500 mg by mouth daily.   clopidogrel 75 MG tablet Commonly known as: Plavix Take 1 tablet (75 mg total) by mouth daily.   Darbepoetin Alfa 200 MCG/0.4ML Sosy injection Commonly known as: ARANESP Inject 0.4 mLs (200 mcg total) into the vein every Monday with hemodialysis.   esomeprazole 40 MG capsule Commonly known as: NEXIUM Take 40 mg by mouth daily at 12 noon.   isosorbide mononitrate 30 MG 24 hr tablet Commonly known as: IMDUR Take 1 tablet (30 mg total) by mouth daily.   loratadine 10 MG tablet Commonly known as: CLARITIN Take 10 mg by mouth daily.   methocarbamol 500 MG tablet Commonly known as: ROBAXIN Take 1,000 mg by mouth 4 (four) times daily as needed for muscle spasms.   metoprolol succinate 25 MG 24 hr tablet Commonly known as: Toprol XL Take 1 tablet (25 mg total) by mouth daily.   multivitamin Tabs tablet Take 1 tablet by mouth at bedtime.   nitroGLYCERIN 0.4 MG SL tablet Commonly known as: NITROSTAT Place 1 tablet (0.4 mg total) under the tongue every 5 (five) minutes as needed for chest pain.   SEVELAMER HCL PO Take by mouth in the morning, at noon, and at bedtime.        Procedures/Studies: MR BRAIN WO CONTRAST  Result Date: 03/15/2022 CLINICAL DATA:  Initial evaluation for seizure. EXAM: MRI HEAD WITHOUT CONTRAST TECHNIQUE: Multiplanar, multiecho pulse sequences of the brain and surrounding structures were obtained without intravenous contrast. COMPARISON:  Prior CT from 03/12/2022. FINDINGS: Brain: Examination is technically limited as the patient was unable to tolerate the exam. Axial and coronal DWI sequences only were performed. Additionally, provided images are degraded by motion artifact. Diffusion-weighted imaging demonstrates no evidence for acute or subacute ischemia or changes  related to recent seizure. No visible areas of chronic cortical infarction. No visible mass lesion, mass effect or midline shift. Ventricles normal size without hydrocephalus. No extra-axial fluid collection. Vascular: Not well assessed on this limited exam. Skull and upper cervical spine: Not well assessed on this limited  exam. Sinuses/Orbits: Not well assessed on this limited exam. Other: None. IMPRESSION: 1. Technically limited study due to patient's inability to tolerate the exam. Axial and coronal DWI sequences only were performed. 2. No acute intracranial infarct or changes related to acute/recent seizure. No other definite acute intracranial abnormality. Electronically Signed   By: Jeannine Boga M.D.   On: 03/15/2022 04:13   EEG adult now  Result Date: 03/13/2022 Lora Havens, MD     03/13/2022 12:36 PM Patient Name: [name] Persky [facility] Sr. MRN: [id number] Epilepsy Attending: Lora Havens Referring Physician/Provider: Karmen Bongo, MD Date: 03/13/2022 Duration: 22.12 mins Patient history: 46 yo M patient without known h/o seizures presenting with concern for acute seizure during HD. EEG to evaluate for seizure Level of alertness: Awake AEDs during EEG study: None Technical aspects: This EEG study was done with scalp electrodes positioned according to the 10-20 International system of electrode placement. Electrical activity was reviewed with band pass filter of 1-70Hz , sensitivity of 7 uV/mm, display speed of 23mm/sec with a 60Hz  notched filter applied as appropriate. EEG data were recorded continuously and digitally stored.  Video monitoring was available and reviewed as appropriate. Description: The posterior dominant rhythm consists of 7.5 Hz activity of moderate voltage (25-35 uV) seen predominantly in posterior head regions, symmetric and reactive to eye opening and eye closing. Hyperventilation and photic stimulation were not performed.  IMPRESSION: This study is within normal  limits. No seizures or epileptiform discharges were seen throughout the recording. A normal interictal EEG does not exclude nor support the diagnosis of epilepsy. Lora Havens   CT Head Wo Contrast  Result Date: 03/12/2022 CLINICAL DATA:  Dialysis patient, concern for seizure activity EXAM: CT HEAD WITHOUT CONTRAST TECHNIQUE: Contiguous axial images were obtained from the base of the skull through the vertex without intravenous contrast. RADIATION DOSE REDUCTION: This exam was performed according to the departmental dose-optimization program which includes automated exposure control, adjustment of the mA and/or kV according to patient size and/or use of iterative reconstruction technique. COMPARISON:  02/22/2006 FINDINGS: Brain: Limited with motion artifact. No definite acute intracranial hemorrhage, mass lesion, new infarction, midline shift, herniation, or hydrocephalus. No extra-axial fluid collection. No focal mass effect or edema. Cisterns are patent. No cerebellar abnormality. Vascular: No hyperdense vessel or unexpected calcification. Skull: Also limited with motion. No acute abnormality. Mastoids are clear. Sinuses/Orbits: Scattered chronic sinus mucosal thickening worse in the right maxillary sinus. No sinus air-fluid level. Orbits unremarkable. Other: None. IMPRESSION: Limited with motion. No significant acute intracranial abnormality by noncontrast CT. Electronically Signed   By: Jerilynn Mages.  Shick M.D.   On: 03/12/2022 13:40   DG Chest 2 View  Result Date: 03/12/2022 CLINICAL DATA:  Cough post seizures today. EXAM: CHEST - 2 VIEW COMPARISON:  Radiographs 12/06/2021.  CT 07/17/2009. FINDINGS: Suboptimal inspiration, especially on the lateral view. 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 There is vascular congestion with small pleural effusions, mild edema and probable left lower lobe atelectasis. No pneumothorax. The bones appear unremarkable. Telemetry leads overlie the chest.  IMPRESSION: Similar radiographic appearance of the chest with cardiomegaly, edema and bilateral pleural effusions suspicious for congestive heart failure. Electronically Signed   By: Richardean Sale M.D.   On: 03/12/2022 13:36   NM PET CT CARDIAC PERFUSION MULTI W/ABSOLUTE BLOODFLOW  Result Date: 02/23/2022   The study is high risk due to severely reduced LVEF based on visual quantification although gating on current study is suboptimal due to poor counts. Recommend TTE  for better evaluation of LVEF.   Poor quality study due to low tracer counts with multiple perfusion abnormalities visualized at stress that do not fit typical coronary distribution and are likely related to low counts rather than true perfusion defects.   Despite the presence of prior CABG and ESRD which can lead to inaccurate myocardial blood flow reserve calculations, the patient's myocardial blood flow was normal and robust on current study. The myocardial blood flow was computed to be 0.60ml/g/min at rest and 2.93ml/g/min at stress. Global myocardial blood flow reserve was 2.65 and was normal. In the presence of poor perfusion imaging due to low tracer counts, normal myocardial blood flow reserve is highly suggestive of no significant obstructive coronary disease.   LV perfusion is abnormal.  There is a small defect with moderate reduction in uptake present in the mid to basal anterior location(s) that is reversible.  There is a small defect with moderate reduction in uptake present in the basal anteroseptal and inferolateral location(s) that is reversible.  There is a small defect with moderate reduction in uptake present in the apical inferior location(s) that is reversible. Perfusion abnormalities do not fit a typical coronary distribution and are more likely related to poor tracer uptake and a poor quality study than true defects given the presence of normal myocardial blood flow reserve.   Rest left ventricular function is abnormal.  Rest global function is severely reduced. End diastolic cavity size is severely enlarged. End systolic cavity size is severely enlarged.   There was tracer uptake in the right ventricle suggestive of underlying pulmonary hypertension.   The patient is s/p coronary bypass surgery. Severe native vessle coronary calcium was present on the attenuation correction CT images. Coronary calcifications were present in the left anterior descending artery, left circumflex artery and right coronary artery distribution(s).   Electronically signed by Gwyndolyn Kaufman, MD CLINICAL DATA:  This over-read does not include interpretation of cardiac or coronary anatomy or pathology.This over-read does not include interpretation of PET data set. The cardiac PET interpretation by the cardiologist is attached. COMPARISON:  None Available. FINDINGS: Limited view of the lung parenchyma demonstrates bilateral small effusions. Airways are normal. Limited view of the mediastinum demonstrates no adenopathy. Esophagus normal. Limited view of the upper abdomen unremarkable. Limited view of the skeleton and chest wall is unremarkable. IMPRESSION: Bilateral small effusions. Electronically Signed   By: Suzy Bouchard M.D.   On: 02/23/2022 11:45    Subjective: Pt reports that he tolerated HD well with no problems, no further seizure activities, feels well for going home.  No specific complaints.    Discharge Exam: Vitals:   03/15/22 1208 03/15/22 1235  BP: (!) 172/78   Pulse: (!) 139   Resp: 19   Temp:  97.9 F (36.6 C)  SpO2: 91%    Vitals:   03/15/22 1135 03/15/22 1208 03/15/22 1235 03/15/22 1247  BP: (!) 147/75 (!) 172/78    Pulse: 82 (!) 139    Resp: (!) 22 19    Temp:   97.9 F (36.6 C)   TempSrc:   Oral   SpO2: 100% 91%    Weight:    83.5 kg  Height:       General: Pt is alert, awake, not in acute distress Cardiovascular: normal S1/S2 +, no rubs, no gallops Respiratory: CTA bilaterally, no wheezing, no  rhonchi Abdominal: Soft, NT, ND, bowel sounds + Extremities: no edema, no cyanosis Neurological: nonfocal exam.    The results of significant  diagnostics from this hospitalization (including imaging, microbiology, ancillary and laboratory) are listed below for reference.     Microbiology: No results found for this or any previous visit (from the past 240 hour(s)).   Labs: BNP (last 3 results) Recent Labs    07/31/21 0041  BNP 654.6*   Basic Metabolic Panel: Recent Labs  Lab 03/12/22 1244 03/13/22 0332  NA 131* 133*  K 3.6 3.6  CL 93* 95*  CO2 22 21*  GLUCOSE 159* 170*  BUN 45* 55*  CREATININE 9.55* 10.55*  CALCIUM 8.2* 8.1*  PHOS  --  6.6*   Liver Function Tests: Recent Labs  Lab 03/12/22 1244 03/13/22 0332  AST 30  --   ALT 21  --   ALKPHOS 127*  --   BILITOT 1.5*  --   PROT 7.5  --   ALBUMIN 3.2* 2.8*   No results for input(s): "LIPASE", "AMYLASE" in the last 168 hours. No results for input(s): "AMMONIA" in the last 168 hours. CBC: Recent Labs  Lab 03/12/22 1244 03/13/22 0332  WBC 4.8 4.6  NEUTROABS 2.5  --   HGB 13.7 13.6  HCT 40.6 39.6  MCV 81.7 82.2  PLT 122* 116*   Cardiac Enzymes: No results for input(s): "CKTOTAL", "CKMB", "CKMBINDEX", "TROPONINI" in the last 168 hours. BNP: Invalid input(s): "POCBNP" CBG: Recent Labs  Lab 03/14/22 1128 03/14/22 1644 03/14/22 2140 03/15/22 0629 03/15/22 0740  GLUCAP 130* 244* 323* 266* 218*   D-Dimer No results for input(s): "DDIMER" in the last 72 hours. Hgb A1c Recent Labs    03/13/22 0332  HGBA1C 9.4*   Lipid Profile No results for input(s): "CHOL", "HDL", "LDLCALC", "TRIG", "CHOLHDL", "LDLDIRECT" in the last 72 hours. Thyroid function studies No results for input(s): "TSH", "T4TOTAL", "T3FREE", "THYROIDAB" in the last 72 hours.  Invalid input(s): "FREET3" Anemia work up No results for input(s): "VITAMINB12", "FOLATE", "FERRITIN", "TIBC", "IRON", "RETICCTPCT" in the last 72  hours. Urinalysis    Component Value Date/Time   COLORURINE RED BIOCHEMICALS MAY BE AFFECTED BY COLOR (A) 01/29/2010 0939   APPEARANCEUR CLOUDY (A) 01/29/2010 0939   LABSPEC 1.020 01/29/2010 0939   PHURINE 8.0 01/29/2010 0939   GLUCOSEU NEGATIVE 01/29/2010 0939   HGBUR LARGE (A) 01/29/2010 0939   BILIRUBINUR MODERATE (A) 01/29/2010 0939   KETONESUR 15 (A) 01/29/2010 0939   PROTEINUR >300 (A) 01/29/2010 0939   UROBILINOGEN 0.2 01/29/2010 0939   NITRITE POSITIVE (A) 01/29/2010 0939   LEUKOCYTESUR LARGE (A) 01/29/2010 0939   Sepsis Labs Recent Labs  Lab 03/12/22 1244 03/13/22 0332  WBC 4.8 4.6   Microbiology No results found for this or any previous visit (from the past 240 hour(s)).  Time coordinating discharge: 37 mins   SIGNED:  Irwin Brakeman, MD  [facility] 03/15/2022, 2:25 PM How to contact the [facility] Attending or Consulting provider [place] or covering provider during after hours [place], for this patient?  Check the care team in [facility] and look for a) attending/consulting [facility] provider listed and b) the [facility] team listed Log into [email or web address] and use [place]'s universal password to access. If you do not have the password, please contact the hospital operator. Locate the [facility] provider you are looking for under [facility] and page to a number that you can be directly reached. If you still have difficulty reaching the provider, please page the [facility] (Director on Call) for the Hospitalists listed on amion for assistance.

## 2022-03-15 NOTE — Plan of Care (Addendum)
Neurology plan of care  Please see neuro progress note from yesterday for full findings and recommendations.  Patient became extremely agitated and MRI and refused to continue.  Only axial and coronal DWI sequences were able to be obtained.  These did not show any acute infarct or changes related to recent possible seizure.  There was no other definite acute intracranial abnormality although again only limited sequences were obtained.  Patient unwilling to repeat imaging study per technician.  As such no further inpatient neuro work-up indicated.  No indication for AEDs at this time.  Driving restrictions were detailed in the neuro progress note from yesterday and he was already counseled on these.  Please copy and paste these driving restrictions into his discharge instructions and discuss them again with his discharge counseling.  Neurology will sign off but please re-engage if any new neurologic concerns arise.  I will arrange outpatient neurology follow-up.  D/w Dr. Wynetta Emery by phone  Su Monks, MD [facility] [phone]  If [place], please page neurology on call as listed in [place].  NO CHARGE NOTE

## 2022-03-15 NOTE — Progress Notes (Signed)
D/C order noted. Contacted [facility] to advise clinic of pt's d/c today and that pt will resume care on Wednesday.   Melven Sartorius Renal Navigator [phone]

## 2022-03-16 ENCOUNTER — Telehealth: Payer: Self-pay | Admitting: Nephrology

## 2022-03-16 NOTE — Telephone Encounter (Signed)
Transition of Care Contact from [place]  Date of Discharge: 03/15/22 Date of Contact: 03/16/22 Method of contact: phone - attempted  Attempted to contact patient to discuss transition of care from inpatient admission.  Patient did not answer the phone.  Message was left on patient's voicemail informing them we would attempt to call them again and if unable to reach will follow up at dialysis.  Jen Mow, PA-C [employer]

## 2022-03-23 ENCOUNTER — Emergency Department (HOSPITAL_COMMUNITY): Payer: Medicaid Other

## 2022-03-23 ENCOUNTER — Emergency Department (HOSPITAL_COMMUNITY)
Admission: EM | Admit: 2022-03-23 | Discharge: 2022-03-23 | Disposition: A | Discharge status: Home / Self Care | Payer: Medicaid Other | Attending: Emergency Medicine | Admitting: Emergency Medicine

## 2022-03-23 ENCOUNTER — Other Ambulatory Visit: Payer: Self-pay

## 2022-03-23 DIAGNOSIS — E1022 Type 1 diabetes mellitus with diabetic chronic kidney disease: Secondary | ICD-10-CM | POA: Insufficient documentation

## 2022-03-23 DIAGNOSIS — N186 End stage renal disease: Secondary | ICD-10-CM | POA: Insufficient documentation

## 2022-03-23 DIAGNOSIS — Z7982 Long term (current) use of aspirin: Secondary | ICD-10-CM | POA: Insufficient documentation

## 2022-03-23 DIAGNOSIS — I252 Old myocardial infarction: Secondary | ICD-10-CM | POA: Diagnosis not present

## 2022-03-23 DIAGNOSIS — Z951 Presence of aortocoronary bypass graft: Secondary | ICD-10-CM | POA: Insufficient documentation

## 2022-03-23 DIAGNOSIS — R0602 Shortness of breath: Secondary | ICD-10-CM

## 2022-03-23 DIAGNOSIS — Z20822 Contact with and (suspected) exposure to covid-19: Secondary | ICD-10-CM | POA: Insufficient documentation

## 2022-03-23 DIAGNOSIS — I5030 Unspecified diastolic (congestive) heart failure: Secondary | ICD-10-CM | POA: Insufficient documentation

## 2022-03-23 DIAGNOSIS — I2510 Atherosclerotic heart disease of native coronary artery without angina pectoris: Secondary | ICD-10-CM | POA: Insufficient documentation

## 2022-03-23 DIAGNOSIS — I120 Hypertensive chronic kidney disease with stage 5 chronic kidney disease or end stage renal disease: Secondary | ICD-10-CM | POA: Diagnosis not present

## 2022-03-23 DIAGNOSIS — Z992 Dependence on renal dialysis: Secondary | ICD-10-CM

## 2022-03-23 LAB — BASIC METABOLIC PANEL
Anion gap: 18 — ABNORMAL HIGH (ref 5–15)
BUN: 71 mg/dL — ABNORMAL HIGH (ref 6–20)
CO2: 16 mmol/L — ABNORMAL LOW (ref 22–32)
Calcium: 8.2 mg/dL — ABNORMAL LOW (ref 8.9–10.3)
Chloride: 100 mmol/L (ref 98–111)
Creatinine, Ser: 11.12 mg/dL — ABNORMAL HIGH (ref 0.61–1.24)
GFR, Estimated: 5 mL/min — ABNORMAL LOW (ref >60)
Glucose, Bld: 252 mg/dL — ABNORMAL HIGH (ref 70–99)
Potassium: 4.4 mmol/L (ref 3.5–5.1)
Sodium: 134 mmol/L — ABNORMAL LOW (ref 135–145)

## 2022-03-23 LAB — CBC WITH DIFFERENTIAL/PLATELET
Abs Immature Granulocytes: 0 10*3/uL (ref 0.00–0.07)
Basophils Absolute: 0.1 10*3/uL (ref 0.0–0.1)
Basophils Relative: 1 %
Eosinophils Absolute: 0.2 10*3/uL (ref 0.0–0.5)
Eosinophils Relative: 3 %
HCT: 42.3 % (ref 39.0–52.0)
Hemoglobin: 13.6 g/dL (ref 13.0–17.0)
Lymphocytes Relative: 29 %
Lymphs Abs: 1.5 10*3/uL (ref 0.7–4.0)
MCH: 28.3 pg (ref 26.0–34.0)
MCHC: 32.2 g/dL (ref 30.0–36.0)
MCV: 88.1 fL (ref 80.0–100.0)
Monocytes Absolute: 0.2 10*3/uL (ref 0.1–1.0)
Monocytes Relative: 4 %
Neutro Abs: 3.2 10*3/uL (ref 1.7–7.7)
Neutrophils Relative %: 63 %
Platelets: 168 10*3/uL (ref 150–400)
RBC: 4.8 MIL/uL (ref 4.22–5.81)
RDW: 22.1 % — ABNORMAL HIGH (ref 11.5–15.5)
WBC: 5 10*3/uL (ref 4.0–10.5)
nRBC: 0 % (ref 0.0–0.2)
nRBC: 0 /100 WBC

## 2022-03-23 LAB — I-STAT CHEM 8, ED
BUN: 69 mg/dL — ABNORMAL HIGH (ref 6–20)
Calcium, Ion: 0.79 mmol/L — CL (ref 1.15–1.40)
Chloride: 103 mmol/L (ref 98–111)
Creatinine, Ser: 12.6 mg/dL — ABNORMAL HIGH (ref 0.61–1.24)
Glucose, Bld: 249 mg/dL — ABNORMAL HIGH (ref 70–99)
HCT: 45 % (ref 39.0–52.0)
Hemoglobin: 15.3 g/dL (ref 13.0–17.0)
Potassium: 4.4 mmol/L (ref 3.5–5.1)
Sodium: 133 mmol/L — ABNORMAL LOW (ref 135–145)
TCO2: 18 mmol/L — ABNORMAL LOW (ref 22–32)

## 2022-03-23 LAB — RESP PANEL BY RT-PCR (FLU A&B, COVID) ARPGX2
Influenza A by PCR: NEGATIVE
Influenza B by PCR: NEGATIVE
SARS Coronavirus 2 by RT PCR: NEGATIVE

## 2022-03-23 LAB — THC,MS,WB/SP RFX
Cannabidiol: 1.7 ng/mL
Cannabinoid Confirmation: POSITIVE
Carboxy-THC: 41 ng/mL
Hydroxy-THC: 1.7 ng/mL
Tetrahydrocannabinol(THC): 4.7 ng/mL

## 2022-03-23 NOTE — ED Triage Notes (Addendum)
BIB EMS for increased SOB and cp, pt reports missing dialysis today and twice last week (MWF). Also reports cough x 1 day.       Received 324 ASA, 1xNitro

## 2022-03-23 NOTE — ED Provider Notes (Signed)
[facility] EMERGENCY DEPARTMENT Provider Note  CSN: [id number] Arrival date & time: 03/23/22 0127  Chief Complaint(s) No chief complaint on file.  HPI [name] Claunch Sr. is a 46 y.o. male with a past medical history listed below including ESRD on dialysis MWF who presents to the emergency department with shortness of breath in the setting of missed dialysis (yesterday) on Monday.  He states that he missed it because he slept in.  He denies any associated chest pain.  Endorses wet cough.  No fevers or chills.  No nausea or vomiting.  No other physical complaints.  The history is provided by the patient.    Past Medical History Past Medical History:  Diagnosis Date   Anemia    Atherosclerosis of lower extremity (HCC)    Blood clot in vein    right calf   CAD (coronary artery disease)    Cataracts, bilateral    Chronic combined systolic and diastolic heart failure (HCC)    Complication of anesthesia    Depression    ESRD (end stage renal disease) on dialysis ([place])    "MWF [facility]" (03/08/2017)   GERD (gastroesophageal reflux disease)    Heart murmur    History of blood transfusion    "related to OR"   Hypertension    Myocardial infarction ([place])     " light"   Nonhealing surgical wound    nonviable tissue   PONV (postoperative nausea and vomiting)    S/P unilateral BKA (below knee amputation), left ([place])    Type II diabetes mellitus ([place])    Wears glasses    Patient Active Problem List   Diagnosis Date Noted   Seizure ([place]) 03/12/2022   Marijuana abuse 03/12/2022   Insomnia 03/12/2022   History of GI bleed 12/22/2021   Hyperlipidemia LDL goal <70 12/21/2021   Upper GI bleed    Dieulafoy lesion of duodenum    Coffee ground emesis    Symptomatic anemia 07/31/2021   Hyperglycemia    Chest pain 06/23/2020   Hyperkalemia    Acute on chronic anemia    Unilateral complete BKA, right, initial encounter ([place])    Diabetes mellitus type 2 in  nonobese ([place])    Bacteremia    Postoperative pain    S/P bilateral below knee amputation ([place]) 11/23/2018   Non-healing wound of lower extremity 11/17/2018   MRSA bacteremia 11/04/2018   Infection of amputation stump, right lower extremity ([place]) 11/04/2018   Non-healing surgical wound 09/14/2018   Chest pain, rule out acute myocardial infarction 09/07/2018   Gangrene of toe of left foot ([place]) 08/05/2018   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[place]) 08/05/2018   Gangrene of foot ([place]) 08/05/2018   ESRD (end stage renal disease) on dialysis ([place]) 08/04/2018   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   ACS (acute coronary syndrome) ([place]) 10/28/2017   Flash pulmonary edema ([place]) 06/04/2017   Hypertensive emergency 06/04/2017   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 stage 5 chronic kidney disease on chronic dialysis ([place]) 06/04/2017   S/P bilateral BKA (below knee amputation) (HCC)    Post-operative pain    Acute blood loss anemia    HFmrEF (heart failure with mildly reduced ejection fraction) (HCC)    PVD (peripheral vascular disease) ([place])    CAD (coronary artery disease)    Tobacco abuse    Diabetic foot infection ([place]) 03/08/2017   Osteomyelitis ([place]) 03/08/2017  Wound dehiscence    Dehiscence of amputation stump (HCC) 01/04/2017   Gangrene of right foot ([place]) 08/02/2016   Achilles tendon contracture, left 08/02/2016   Anemia of renal disease 08/16/2015   Wound infection 08/15/2015   Diabetic ulcer of right great toe ([place]) 08/15/2015   Pain in the chest    Elevated troponin    Essential hypertension    ESRD on dialysis [facility]) 08/07/2013   NSTEMI (non-ST elevated myocardial infarction) ([place]) 05/16/2012   Murmur 05/16/2012   Renal disorder    Home Medication(s) Prior to Admission medications   Medication Sig Start Date End Date Taking? Authorizing Provider  acetaminophen (TYLENOL) 325 MG tablet Take 2 tablets (650  mg total) by mouth every 4 (four) hours as needed for headache or mild pain. 09/08/18   Geradine Girt, DO  aspirin EC 81 MG tablet Take 1 tablet (81 mg total) by mouth daily. 02/07/18   Burtis Junes, NP  atorvastatin (LIPITOR) 80 MG tablet Take 1 tablet (80 mg total) by mouth daily. 10/27/21   Elgie Collard, PA-C  Blood Pressure Monitoring (BLOOD PRESSURE MONITOR/S CUFF) MISC Check blood pressure every day 10/27/21   Elgie Collard, PA-C  calcitRIOL (ROCALTROL) 0.25 MCG capsule Take 5 capsules (1.25 mcg total) by mouth every Monday, Wednesday, and Friday with hemodialysis. 11/09/17   Elgie Collard, PA-C  calcium carbonate (TUMS - DOSED IN MG ELEMENTAL CALCIUM) 500 MG chewable tablet Chew 500 mg by mouth daily. 04/25/20   [provider]  clopidogrel (PLAVIX) 75 MG tablet Take 1 tablet (75 mg total) by mouth daily. 10/27/21   Elgie Collard, PA-C  Darbepoetin Alfa (ARANESP) 200 MCG/0.4ML SOSY injection Inject 0.4 mLs (200 mcg total) into the vein every Monday with hemodialysis. 12/04/18   Angiulli, Lavon Paganini, PA-C  esomeprazole (NEXIUM) 40 MG capsule Take 40 mg by mouth daily at 12 noon.    [provider]  isosorbide mononitrate (IMDUR) 30 MG 24 hr tablet Take 1 tablet (30 mg total) by mouth daily. 12/22/21   Richardson Dopp T, PA-C  loratadine (CLARITIN) 10 MG tablet Take 10 mg by mouth daily.    [provider]  methocarbamol (ROBAXIN) 500 MG tablet Take 1,000 mg by mouth 4 (four) times daily as needed for muscle spasms. 06/17/20   [provider]  metoprolol succinate (TOPROL XL) 25 MG 24 hr tablet Take 1 tablet (25 mg total) by mouth daily. 12/22/21   Richardson Dopp T, PA-C  multivitamin (RENA-VIT) TABS tablet Take 1 tablet by mouth at bedtime. 08/17/18   Kayleen Memos, DO  nitroGLYCERIN (NITROSTAT) 0.4 MG SL tablet Place 1 tablet (0.4 mg total) under the tongue every 5 (five) minutes as needed for chest pain. 10/27/21   Elgie Collard, PA-C  SEVELAMER HCL PO Take by mouth  in the morning, at noon, and at bedtime.    [provider]  Allergies Coconut (cocos nucifera)  Review of Systems Review of Systems As noted in HPI  Physical Exam Vital Signs  I have reviewed the triage vital signs BP 90/73 (BP Location: Right Arm)   Pulse 96   Temp 97.7 F (36.5 C) (Oral)   Resp 18   Ht 6\' 2"  (1.88 m)   Wt 78 kg   SpO2 95%   BMI 22.08 kg/m   Physical Exam Vitals reviewed.  Constitutional:      General: He is not in acute distress.    Appearance: He is well-developed. He is not diaphoretic.  HENT:     Head: Normocephalic and atraumatic.     Nose: Nose normal.  Eyes:     General: No scleral icterus.       Right eye: No discharge.        Left eye: No discharge.     Conjunctiva/sclera: Conjunctivae normal.     Pupils: Pupils are equal, round, and reactive to light.  Cardiovascular:     Rate and Rhythm: Normal rate and regular rhythm.     Heart sounds: No murmur heard.    No friction rub. No gallop.  Pulmonary:     Effort: Pulmonary effort is normal. Tachypnea present. No respiratory distress.     Breath sounds: No stridor or decreased air movement. Rhonchi present. No rales.  Abdominal:     General: There is no distension.     Palpations: Abdomen is soft.     Tenderness: There is no abdominal tenderness.  Musculoskeletal:        General: No tenderness.     Cervical back: Normal range of motion and neck supple.     Comments: Bilateral BKA  Skin:    General: Skin is warm and dry.     Findings: No erythema or rash.  Neurological:     Mental Status: He is alert and oriented to person, place, and time.     ED Results and Treatments Labs (all labs ordered are listed, but only abnormal results are displayed) Labs Reviewed  BASIC METABOLIC PANEL - Abnormal; Notable for the following components:      Result  Value   Sodium 134 (*)    CO2 16 (*)    Glucose, Bld 252 (*)    BUN 71 (*)    Creatinine, Ser 11.12 (*)    Calcium 8.2 (*)    GFR, Estimated 5 (*)    Anion gap 18 (*)    All other components within normal limits  CBC WITH DIFFERENTIAL/PLATELET - Abnormal; Notable for the following components:   RDW 22.1 (*)    All other components within normal limits  I-STAT CHEM 8, ED - Abnormal; Notable for the following components:   Sodium 133 (*)    BUN 69 (*)    Creatinine, Ser 12.60 (*)    Glucose, Bld 249 (*)    Calcium, Ion 0.79 (*)    TCO2 18 (*)    All other components within normal limits  RESP PANEL BY RT-PCR (FLU A&B, COVID) ARPGX2  EKG  EKG Interpretation  Date/Time:  Tuesday March 23 2022 03:04:24 EDT Ventricular Rate:  85 PR Interval:  164 QRS Duration: 97 QT Interval:  368 QTC Calculation: 438 R Axis:   49 Text Interpretation: Sinus rhythm Anteroseptal infarct, old Nonspecific T abnormalities, lateral leads No significant change was found Confirmed by Addison Lank [phone]) on 03/23/2022 3:17:54 AM       Radiology DG Chest Port 1 View  Result Date: 03/23/2022 CLINICAL DATA:  Dyspnea EXAM: PORTABLE CHEST 1 VIEW COMPARISON:  03/12/2022 FINDINGS: Lungs are clear. No pneumothorax or pleural effusion. Coronary artery bypass grafting has been performed. Stable cardiomegaly. Pulmonary vascularity is normal. No acute bone abnormality. IMPRESSION: No active disease.  Stable cardiomegaly. Electronically Signed   By: Fidela Salisbury M.D.   On: 03/23/2022 02:37    Medications Ordered in ED Medications - No data to display                                                                                                                                   Procedures .1-3 Lead EKG Interpretation  Performed by: Fatima Blank, MD Authorized by: Fatima Blank, MD     Interpretation: normal     ECG rate:  95   ECG rate assessment: normal     Rhythm: sinus rhythm     Ectopy: none     Conduction: normal     (including critical care time)  Medical Decision Making / ED Course   Medical Decision Making Amount and/or Complexity of Data Reviewed Labs: ordered. Decision-making details documented in ED Course. Radiology: ordered and independent interpretation performed. Decision-making details documented in ED Course. ECG/medicine tests: ordered and independent interpretation performed. Decision-making details documented in ED Course.  Risk Decision regarding hospitalization. Diagnosis or treatment significantly limited by social determinants of health. Risk Details: Financial constraints requiring transportation to HD    Patient presents with shortness of breath in the setting of missed dialysis. Patient is satting well on room air. Will assess for volume status, pneumonia, pneumothorax. Will assess for electrolyte derangements.  Laboratory Tests ordered listed below with my independent interpretation: CBC without leukocytosis or anemia Metabolic panel without significant electrolyte derangement.  Renal function consistent with ESRD, slightly worse than recent. COVID/influenza negative  Imaging Studies ordered listed below with my independent interpretation: Chest x-ray with cardiomegaly but no pulmonary vascular congestion, pulmonary edema, pneumonia, pneumothorax, pleural effusions.  No findings supporting the need for emergent dialysis. Rest of the workup was reassuring.  I spoke with Dr. Joylene Grapes from nephrology who will assist in arranging for dialysis. Will have SW arrange transport to HD.     Final Clinical Impression(s) / ED Diagnoses Final diagnoses:  ESRD on hemodialysis ([place])  SOB (shortness of breath)   The patient appears reasonably screened and/or stabilized for discharge and I doubt any other medical  condition or other [facility] requiring further screening, evaluation, or treatment in  the ED at this time. I have discussed the findings, Dx and Tx plan with the patient/family who expressed understanding and agree(s) with the plan. Discharge instructions discussed at length. The patient/family was given strict return precautions who verbalized understanding of the instructions. No further questions at time of discharge.  Disposition: Discharge  Condition: Good  ED Discharge Orders     None         Follow Up: Sandi Mariscal, [place] [place] [id number] [phone]  Call  to schedule an appointment for close follow up  [place] to             This chart was dictated using voice recognition software.  Despite best efforts to proofread,  errors can occur which can change the documentation meaning.    Fatima Blank, MD 03/23/22 [phone]

## 2022-03-23 NOTE — Progress Notes (Signed)
Contacted by renal PA regarding pt's need for out-pt HD treatment at clinic today (pt's off day). Contacted [facility] and spoke to [employer]. Clinic can treat pt today if pt can arrive by 12:00. ED staff arranged for pt to transport to clinic via [facility]. Clinic concerned how pt will get home since clinic does not have transportation resources. Unable to reach pt via phone. Contacted pt's first emergency contact, Kristeen Miss, to inquire if she could transport pt home from HD clinic once pt completes treatment. Kristeen Miss states she can assist pt with ride home. This info was provided to ED RN to provide to pt so pt can contact [place] regarding the time he will be ready to be picked up. Kristeen Miss to try to call pt as well. Contacted [facility] and spoke to Jud. Clinic aware pt will be arriving shortly via [facility] and that [place] can assist with transportation home.   Melven Sartorius Renal Navigator [phone]

## 2022-03-23 NOTE — ED Notes (Signed)
Patient during triage noted to have audible wheezes, and taking deep breath. Denies any sort of pain at the moment. Awaiting ED provider evaluation

## 2022-03-25 LAB — DRUG SCREEN 10 W/CONF, SERUM
Amphetamines, IA: NEGATIVE ng/mL
Barbiturates, IA: NEGATIVE ug/mL
Benzodiazepines, IA: NEGATIVE ng/mL
Cocaine & Metabolite, IA: POSITIVE ng/mL — AB
Methadone, IA: NEGATIVE ng/mL
Opiates, IA: NEGATIVE ng/mL
Oxycodones, IA: NEGATIVE ng/mL
Phencyclidine, IA: NEGATIVE ng/mL
Propoxyphene, IA: NEGATIVE ng/mL
THC(Marijuana) Metabolite, IA: POSITIVE ng/mL — AB

## 2022-03-25 LAB — COCAINE,MS,WB/SP RFX
Benzoylecgonine: >1500 ng/mL
Cocaine Confirmation: POSITIVE
Cocaine: NEGATIVE ng/mL

## 2022-03-29 ENCOUNTER — Emergency Department (HOSPITAL_BASED_OUTPATIENT_CLINIC_OR_DEPARTMENT_OTHER): Admit: 2022-03-29 | Discharge: 2022-03-29 | Disposition: A | Discharge status: Home / Self Care | Payer: Medicaid Other

## 2022-03-29 ENCOUNTER — Other Ambulatory Visit: Payer: Self-pay

## 2022-03-29 ENCOUNTER — Emergency Department (HOSPITAL_COMMUNITY)
Admission: EM | Admit: 2022-03-29 | Discharge: 2022-03-30 | Discharge status: Against Medical Advice | Payer: Medicaid Other | Attending: Emergency Medicine | Admitting: Emergency Medicine

## 2022-03-29 ENCOUNTER — Emergency Department (HOSPITAL_COMMUNITY): Payer: Medicaid Other

## 2022-03-29 DIAGNOSIS — R0602 Shortness of breath: Secondary | ICD-10-CM | POA: Insufficient documentation

## 2022-03-29 DIAGNOSIS — R52 Pain, unspecified: Secondary | ICD-10-CM | POA: Diagnosis not present

## 2022-03-29 DIAGNOSIS — Z5321 Procedure and treatment not carried out due to patient leaving prior to being seen by health care provider: Secondary | ICD-10-CM | POA: Diagnosis not present

## 2022-03-29 DIAGNOSIS — R059 Cough, unspecified: Secondary | ICD-10-CM | POA: Insufficient documentation

## 2022-03-29 DIAGNOSIS — M79605 Pain in left leg: Secondary | ICD-10-CM | POA: Insufficient documentation

## 2022-03-29 DIAGNOSIS — M7989 Other specified soft tissue disorders: Secondary | ICD-10-CM | POA: Insufficient documentation

## 2022-03-29 NOTE — ED Triage Notes (Signed)
Pt bib ems, pt just finished his dialysis session. C.o left leg pain and sob for 1 week. Sharp pain upon palpation. No injury to leg reported  Rhonchi in all lungs, + cough and congestion   97% on room air 160/100 HR 104 CBG 182

## 2022-03-29 NOTE — ED Provider Triage Note (Signed)
Emergency Medicine Provider Triage Evaluation Note  Heith Haigler [facility] Sr. , a 46 y.o. male  was evaluated in triage.  Pt complains of shortness of breath and left leg swelling/pain.  Reports that shortness of breath has been bothering for the last 2 weeks since he was admitted to the hospital for seizures.  Patient denies any aggravating factors.  Patient does endorse associated nonproductive cough.  Complains of pain and swelling to left leg.  States that this has been present over the last few days.  Review of Systems  Positive: Shortness of breath, leg swelling/tenderness Negative: Fever, chills, chest pain, lightheadedness, syncope, chest pain, rhinorrhea, sore throat  Physical Exam  BP 138/63 (BP Location: Right Arm)   Pulse 98   Temp 99.9 F (37.7 C)   Resp 18   SpO2 95%  Gen:   Awake, no distress   Resp:  Normal effort, to auscultation bilaterally MSK:   Moves extremities without difficulty; bilateral BKA.  Patient has diffuse tenderness to left thigh without swelling noted. Other:    Medical Decision Making  Medically screening exam initiated at 4:09 PM.  Appropriate orders placed.  [name] was informed that the remainder of the evaluation will be completed by another provider, this initial triage assessment does not replace that evaluation, and the importance of remaining in the ED until their evaluation is complete.     Loni Beckwith, [place] 03/29/22 1610

## 2022-03-29 NOTE — ED Notes (Signed)
Patient states "I dont want no blood pressure I came here from dialysis I just want to see the doctor nothing else."

## 2022-03-29 NOTE — ED Notes (Signed)
Pt wants their labs drawn from IV team

## 2022-03-29 NOTE — Progress Notes (Signed)
Left lower extremity venous duplex has been completed. Preliminary results can be found in CV Proc through chart review.  Results were given to Debbe Mounts PA.  03/29/22 5:18 PM Carlos Levering RVT

## 2022-03-30 ENCOUNTER — Ambulatory Visit: Payer: Medicaid Other | Admitting: Neurology

## 2022-03-30 ENCOUNTER — Encounter: Payer: Self-pay | Admitting: Neurology

## 2022-04-14 ENCOUNTER — Emergency Department (HOSPITAL_COMMUNITY)
Admission: EM | Admit: 2022-04-14 | Discharge: 2022-04-14 | Disposition: A | Discharge status: Home / Self Care | Payer: Medicaid Other | Attending: Emergency Medicine | Admitting: Emergency Medicine

## 2022-04-14 ENCOUNTER — Encounter (HOSPITAL_COMMUNITY): Payer: Self-pay | Admitting: Emergency Medicine

## 2022-04-14 ENCOUNTER — Other Ambulatory Visit: Payer: Self-pay

## 2022-04-14 DIAGNOSIS — M79605 Pain in left leg: Secondary | ICD-10-CM

## 2022-04-14 DIAGNOSIS — L989 Disorder of the skin and subcutaneous tissue, unspecified: Secondary | ICD-10-CM | POA: Insufficient documentation

## 2022-04-14 DIAGNOSIS — G8929 Other chronic pain: Secondary | ICD-10-CM | POA: Insufficient documentation

## 2022-04-14 DIAGNOSIS — M79662 Pain in left lower leg: Secondary | ICD-10-CM | POA: Diagnosis present

## 2022-04-14 MED ORDER — CEPHALEXIN 250 MG PO CAPS
500.0000 mg | ORAL_CAPSULE | Freq: Once | ORAL | Status: AC
  Administered 2022-04-14: 500 mg via ORAL
  Filled 2022-04-14: qty 2

## 2022-04-14 MED ORDER — CEPHALEXIN 500 MG PO CAPS: 500.0000 mg | ORAL_CAPSULE | Freq: Two times a day (BID) | ORAL | 0 refills | Status: DC

## 2022-04-14 MED ORDER — CEPHALEXIN 500 MG PO CAPS: 500.0000 mg | ORAL_CAPSULE | Freq: Two times a day (BID) | ORAL | 0 refills | Status: AC

## 2022-04-14 MED ORDER — OXYCODONE HCL 5 MG PO TABS
5.0000 mg | ORAL_TABLET | Freq: Once | ORAL | Status: AC
  Administered 2022-04-14: 5 mg via ORAL
  Filled 2022-04-14: qty 1

## 2022-04-14 NOTE — ED Triage Notes (Signed)
Pt to ED via [facility] c/o possible abscess to left thigh area. Pt is a dialysis pt w/last tx today.

## 2022-04-14 NOTE — ED Provider Notes (Signed)
[facility] EMERGENCY DEPARTMENT Provider Note   CSN: [id number] Arrival date & time: 04/14/22  1824     History Chief Complaint  Patient presents with   Leg Pain    HPI [name] Delis Sr. is a 46 y.o. male presenting for acute on chronic left lower extremity pain.  He has a history of skin infections at the site secondary to recurrent skin picking.  He states that he has had another lesion show up over the last 3 days. He feels that it might be an abscess as it is causing severe pain.  He has lesions all over his body secondary to recurrent skin picking.  This is the only 1 that is bothering him today.  He denies fevers or chills nausea vomiting syncope shortness of breath.  Pain is localized to the left anterior lateral thigh.   Patient's recorded medical, surgical, social, medication list and allergies were reviewed in the Snapshot window as part of the initial history.   Review of Systems   Review of Systems  Constitutional:  Negative for chills and fever.  HENT:  Negative for ear pain and sore throat.   Eyes:  Negative for pain and visual disturbance.  Respiratory:  Negative for cough and shortness of breath.   Cardiovascular:  Negative for chest pain and palpitations.  Gastrointestinal:  Negative for abdominal pain and vomiting.  Genitourinary:  Negative for dysuria and hematuria.  Musculoskeletal:  Negative for arthralgias and back pain.  Skin:  Positive for wound. Negative for color change and rash.  Neurological:  Negative for seizures and syncope.  All other systems reviewed and are negative.   Physical Exam Updated Vital Signs BP 132/63 (BP Location: Right Arm)   Pulse 98   Temp 98.7 F (37.1 C) (Oral)   Resp 18   SpO2 96%  Physical Exam Vitals and nursing note reviewed.  Constitutional:      General: He is not in acute distress.    Appearance: He is well-developed.  HENT:     Head: Normocephalic and atraumatic.  Eyes:      Conjunctiva/sclera: Conjunctivae normal.  Cardiovascular:     Rate and Rhythm: Normal rate and regular rhythm.     Heart sounds: No murmur heard. Pulmonary:     Effort: Pulmonary effort is normal. No respiratory distress.     Breath sounds: Normal breath sounds.  Abdominal:     Palpations: Abdomen is soft.     Tenderness: There is no abdominal tenderness.  Musculoskeletal:        General: Signs of injury present. No swelling or deformity.     Cervical back: Neck supple.  Skin:    General: Skin is warm and dry.     Capillary Refill: Capillary refill takes less than 2 seconds.     Findings: Lesion present.     Comments: Patient has diffuse lesions in various stages of healing all over his proximal and distal extremities.  In his left thigh there are multiple more fresh lesions.  Ultrasound was performed that demonstrated fluid in the superficial tissues without evidence of deep space abscess.  Neurological:     Mental Status: He is alert.  Psychiatric:        Mood and Affect: Mood normal.      ED Course/ Medical Decision Making/ A&P    Procedures Procedures   ULTRASOUND LIMITED SOFT TISSUE/ MUSCULOSKELETAL: LLE Thigh Indication: Soft tissue swelling Linear probe used to evaluate area of interest in two  planes. Findings:  No acute pathology, fluid in the superficial p planes concerning for developing cellulitis Performed by: Dr Oswald Hillock Images saved electronically  Medications Ordered in ED Medications  oxyCODONE (Oxy IR/ROXICODONE) immediate release tablet 5 mg (5 mg Oral Given 04/14/22 1908)  cephALEXin (KEFLEX) capsule 500 mg (500 mg Oral Given 04/14/22 1908)    Medical Decision Making:    [name] Labella Sr. is a 46 y.o. male who presented to the ED today with skin lesion on his left anterior thigh, leg pain detailed above.     Patient's presentation is complicated by their history of multiple comorbid medical problems including ESRD on dialysis, poorly  controlled diabetes, CAD.  Patient placed on continuous vitals and telemetry monitoring while in ED which was reviewed periodically.   Complete initial physical exam performed, notably the patient  was hemodynamically stable in no acute distress.  Patient only has pain localized over the lesion and area of warmth.  He does not have any thigh swelling or tenderness and does not have any pain or tenderness down in his BKA stump.      Reviewed and confirmed nursing documentation for past medical history, family history, social history.    Initial Assessment:   With the patient's presentation of skin lesion and pain, most likely diagnosis is localized cellulitis likely secondary to recurrent skin picking. Other diagnoses were considered including (but not limited to) abscess not visible on ultrasound, DVT less likely based on the localized nature of patient's symptoms, necrotizing fasciitis is less consistent with patient's history of present illness and physical exam findings.. These are considered less likely due to history of present illness and physical exam findings.   This is most consistent with an acute complicated illness  Initial Plan:  Ultrasound performed as above demonstrating concern for fluid and soft tissue spaces which may be consistent with developing cellulitis.  Considered more interventional evaluation and discussed this with the patient.  He states that he would prefer to not get an IV placed and prefer to attempt by mouth medication with plan for reassessment by primary care provider.  This is reasonable given his normal vital signs, lack of fever and overall well appearance without any signs of systemic infection.  We will plan for outpatient care and management on Keflex with reassessment by primary care provider within 48 hours.  Clinical Impression:  1. Pain of left lower extremity      Data Unavailable   Final Clinical Impression(s) / ED Diagnoses Final diagnoses:  Pain  of left lower extremity    Rx / DC Orders ED Discharge Orders          Ordered    cephALEXin (KEFLEX) 500 MG capsule  2 times daily        04/14/22 1912              Tretha Sciara, MD 04/14/22 2024

## 2022-06-30 ENCOUNTER — Other Ambulatory Visit (HOSPITAL_COMMUNITY): Payer: Self-pay | Admitting: Nephrology

## 2022-06-30 DIAGNOSIS — N186 End stage renal disease: Secondary | ICD-10-CM

## 2022-06-30 DIAGNOSIS — Z992 Dependence on renal dialysis: Secondary | ICD-10-CM

## 2022-07-04 ENCOUNTER — Encounter (HOSPITAL_COMMUNITY): Payer: Self-pay

## 2022-07-04 NOTE — H&P (Incomplete)
Chief Complaint: Declining Flow. Request is for fistulogram with possibel intervention. Left upper arm. Brachial to cephalic vein AVF.   Referring Physician(s): [place] B  Supervising Physician: {Supervising Physician:[id number]}  Patient Status: [facility] - Out-pt  History of Present Illness: [name] Corbo Sr. is a 46 y.o. male ***  Past Medical History:  Diagnosis Date   Anemia    Atherosclerosis of lower extremity ([place])    Blood clot in vein    right calf   CAD (coronary artery disease)    Cataracts, bilateral    Chronic combined systolic and diastolic heart failure (HCC)    Complication of anesthesia    Depression    ESRD (end stage renal disease) on dialysis ([place])    "MWF [employer]" (03/08/2017)   GERD (gastroesophageal reflux disease)    Heart murmur    History of blood transfusion    "related to OR"   Hypertension    Myocardial infarction ([place])     " light"   Nonhealing surgical wound    nonviable tissue   PONV (postoperative nausea and vomiting)    S/P unilateral BKA (below knee amputation), left ([place])    Type II diabetes mellitus ([place])    Wears glasses     Past Surgical History:  Procedure Laterality Date   ABDOMINAL AORTOGRAM N/A 11/02/2016   Procedure: Abdominal Aortogram;  Surgeon: Waynetta Sandy, MD;  Location: [place] [facility];  Service: Cardiovascular;  Laterality: N/A;   ABDOMINAL AORTOGRAM W/LOWER EXTREMITY Right 08/07/2018   Procedure: ABDOMINAL AORTOGRAM W/LOWER EXTREMITY;  Surgeon: [name], MD;  Location: [place] [facility];  Service: Cardiovascular;  Laterality: Right;   AMPUTATION Left 09/27/2013   Procedure: LEFT GREAT TOE AMPUTATION;  Surgeon: [name], MD;  Location: [place];  Service: Orthopedics;  Laterality: Left;   AMPUTATION Right 08/15/2015   Procedure: Right Great Toe Amputation;  Surgeon: [name], MD;  Location: [place];  Service: Orthopedics;  Laterality: Right;   AMPUTATION Left 11/05/2016    Procedure: TRANSMETATARSAL AMPUTATION LEFT FOOT;  Surgeon: [name], MD;  Location: [place];  Service: Orthopedics;  Laterality: Left;   AMPUTATION Left 03/11/2017   Procedure: LEFT BELOW KNEE AMPUTATION;  Surgeon: [name], MD;  Location: [place];  Service: Orthopedics;  Laterality: Left;   AMPUTATION Right 03/11/2017   Procedure: RIGHT 2ND TOE AMPUTATION;  Surgeon: [name], MD;  Location: [place];  Service: Orthopedics;  Laterality: Right;   AMPUTATION Right 11/17/2018   Procedure: AMPUTATION BELOW KNEE;  Surgeon: [name], MD;  Location: [place];  Service: Vascular;  Laterality: Right;   AV FISTULA PLACEMENT  left arm   CORONARY ARTERY BYPASS GRAFT N/A 11/04/2017   Procedure: CORONARY ARTERY BYPASS GRAFTING (CABG) x three, using left internal mammary artery and right    leg greater saphenous vein;  Surgeon: Melrose Nakayama, MD;  Location: [place];  Service: Open Heart Surgery;  Laterality: N/A;   ESOPHAGOGASTRODUODENOSCOPY (EGD) WITH PROPOFOL N/A 08/02/2021   Procedure: ESOPHAGOGASTRODUODENOSCOPY (EGD) WITH PROPOFOL;  Surgeon: Yetta Flock, MD;  Location: [place];  Service: Gastroenterology;  Laterality: N/A;   FASCIOTOMY Right 08/07/2018   Procedure: FOUR COMPARTMENT FASCIOTOMY OF RIGHT LOWER LEG;  Surgeon: Rosetta Posner, MD;  Location: [place];  Service: Vascular;  Laterality: Right;   FASCIOTOMY CLOSURE Right 08/10/2018   Procedure: FASCIOTOMY CLOSURE RIGHT LOWER EXTREMITY;  Surgeon: [name], MD;  Location: [place];  Service: Vascular;  Laterality: Right;   HEMATOMA EVACUATION Right 08/07/2018   Procedure: EVACUATION HEMATOMA;  Surgeon: Rosetta Posner, MD;  Location: [place];  Service: Vascular;  Laterality: Right;   HEMOSTASIS CLIP PLACEMENT  08/02/2021   Procedure: HEMOSTASIS CLIP PLACEMENT;  Surgeon: Yetta Flock, MD;  Location: [place] [facility];  Service: Gastroenterology;;   HOT HEMOSTASIS N/A 08/02/2021   Procedure: HOT HEMOSTASIS (ARGON  PLASMA COAGULATION/BICAP);  Surgeon: Yetta Flock, MD;  Location: [facility] ENDOS[facility];  Service: Gastroenterology;  Laterality: N/A;   LEFT HEART CATH AND CORONARY ANGIOGRAPHY N/A 10/31/2017   Procedure: LEFT HEART CATH AND CORONARY ANGIOGRAPHY;  Surgeon: Troy Sine, MD;  Location: [place] [facility];  Service: Cardiovascular;  Laterality: N/A;   LEFT HEART CATHETERIZATION WITH CORONARY ANGIOGRAM N/A 09/13/2014   Procedure: LEFT HEART CATHETERIZATION WITH CORONARY ANGIOGRAM;  Surgeon: Sinclair Grooms, MD;  Location[facility] C[facility] LAB;  Service: Cardiovascular;  Laterality: N/A;   LOWER EXTREMITY ANGIOGRAPHY Bilateral 11/02/2016   Procedure: Lower Extremity Angiography;  Surgeon: Waynetta Sandy, MD;  Location: [place] [facility];  Service: Cardiovascular;  Laterality: Bilateral;   PERIPHERAL VASCULAR ATHERECTOMY Left 11/02/2016   Procedure: Peripheral Vascular Atherectomy;  Surgeon: Waynetta Sandy, MD;  Location: [place] [facility];  Service: Cardiovascular;  Laterality: Left;  PERONEAL   PERIPHERAL VASCULAR ATHERECTOMY Right 08/07/2018   Procedure: PERIPHERAL VASCULAR ATHERECTOMY;  Surgeon: [name], MD;  Location: [place] [facility];  Service: Cardiovascular;  Laterality: Right;  Peroneal artery   PERIPHERAL VASCULAR BALLOON ANGIOPLASTY Right 08/07/2018   Procedure: PERIPHERAL VASCULAR BALLOON ANGIOPLASTY;  Surgeon: [name], MD;  Location: [place] [facility];  Service: Cardiovascular;  Laterality: Right;  anterior tibial artery   STUMP REVISION Left 01/11/2017   Procedure: Revision Left Transmetatarsal Amputation;  Surgeon: [name], MD;  Location: [place];  Service: Orthopedics;  Laterality: Left;   TEE WITHOUT CARDIOVERSION N/A 11/04/2017   Procedure: TRANSESOPHAGEAL ECHOCARDIOGRAM (TEE);  Surgeon: Melrose Nakayama, MD;  Location: [place];  Service: Open Heart Surgery;  Laterality: N/A;   TRANSMETATARSAL AMPUTATION Right 08/10/2018   Procedure:  RIGHT FIFTH TOE AMPUTATION;  Surgeon: [name], MD;  Location: [place];  Service: Vascular;  Laterality: Right;   TRANSMETATARSAL AMPUTATION Right 09/14/2018   Procedure: TRANSMETATARSAL AMPUTATION;  Surgeon: [name], MD;  Location: [place];  Service: Vascular;  Laterality: Right;    Allergies: Coconut (cocos nucifera)  Medications: Prior to Admission medications   Medication Sig Start Date End Date Taking? Authorizing Provider  acetaminophen (TYLENOL) 325 MG tablet Take 2 tablets (650 mg total) by mouth every 4 (four) hours as needed for headache or mild pain. 09/08/18   Geradine Girt, DO  aspirin EC 81 MG tablet Take 1 tablet (81 mg total) by mouth daily. 02/07/18   Burtis Junes, NP  atorvastatin (LIPITOR) 80 MG tablet Take 1 tablet (80 mg total) by mouth daily. 10/27/21   Elgie Collard, PA-C  Blood Pressure Monitoring (BLOOD PRESSURE MONITOR/S CUFF) MISC Check blood pressure every day 10/27/21   Elgie Collard, PA-C  calcitRIOL (ROCALTROL) 0.25 MCG capsule Take 5 capsules (1.25 mcg total) by mouth every Monday, Wednesday, and Friday with hemodialysis. 11/09/17   Elgie Collard, PA-C  calcium carbonate (TUMS - DOSED IN MG ELEMENTAL CALCIUM) 500 MG chewable tablet Chew 500 mg by mouth daily. 04/25/20   [provider]  clopidogrel (PLAVIX) 75 MG tablet Take 1 tablet (75 mg total) by  mouth daily. 10/27/21   Elgie Collard, PA-C  Darbepoetin Alfa (ARANESP) 200 MCG/0.4ML SOSY injection Inject 0.4 mLs (200 mcg total) into the vein every Monday with hemodialysis. 12/04/18   Angiulli, Lavon Paganini, PA-C  esomeprazole (NEXIUM) 40 MG capsule Take 40 mg by mouth daily at 12 noon.    [provider]  isosorbide mononitrate (IMDUR) 30 MG 24 hr tablet Take 1 tablet (30 mg total) by mouth daily. 12/22/21   Richardson Dopp T, PA-C  loratadine (CLARITIN) 10 MG tablet Take 10 mg by mouth daily.    [provider]  methocarbamol (ROBAXIN) 500 MG tablet Take 1,000 mg by  mouth 4 (four) times daily as needed for muscle spasms. 06/17/20   [provider]  metoprolol succinate (TOPROL XL) 25 MG 24 hr tablet Take 1 tablet (25 mg total) by mouth daily. 12/22/21   Richardson Dopp T, PA-C  multivitamin (RENA-VIT) TABS tablet Take 1 tablet by mouth at bedtime. 08/17/18   Kayleen Memos, DO  nitroGLYCERIN (NITROSTAT) 0.4 MG SL tablet Place 1 tablet (0.4 mg total) under the tongue every 5 (five) minutes as needed for chest pain. 10/27/21   Elgie Collard, PA-C  SEVELAMER HCL PO Take by mouth in the morning, at noon, and at bedtime.    [provider]     Family History  Problem Relation Age of Onset   Heart failure Mother    Hypertension Mother     Social History   Socioeconomic History   Marital status: Legally Separated    Spouse name: Not on file   Number of children: Not on file   Years of education: Not on file   Highest education level: Not on file  Occupational History   Not on file  Tobacco Use   Smoking status: Former    Packs/day: 0.50    Years: 26.00    Total pack years: 13.00    Types: Cigarettes    Quit date: 10/25/2018    Years since quitting: 3.6   Smokeless tobacco: Never  Vaping Use   Vaping Use: Never used  Substance and Sexual Activity   Alcohol use: Not Currently    Comment:  "haven't took a drink in over 5 years"   Drug use: Yes    Types: Marijuana    Comment: occasional   Sexual activity: Yes  Other Topics Concern   Not on file  Social History Narrative   Not on file   Social Determinants of Health   Financial Resource Strain: Not on file  Food Insecurity: Not on file  Transportation Needs: Not on file  Physical Activity: Not on file  Stress: Not on file  Social Connections: Not on file    ECOG Status: {CHL ONC ECOG SE:[id number]}  Review of Systems: A 12 point ROS discussed and pertinent positives are indicated in the HPI above.  All other systems are negative.  Review of Systems  Vital  Signs: There were no vitals taken for this visit.    Physical Exam  Imaging: No results found.  Labs:  CBC: Recent Labs    12/06/21 2350 03/12/22 1244 03/13/22 0332 03/23/22 0201 03/23/22 0240  WBC 6.7 4.8 4.6 5.0  --   HGB 9.4* 13.7 13.6 13.6 15.3  HCT 29.7* 40.6 39.6 42.3 45.0  PLT 284 122* 116* 168  --     COAGS: Recent Labs    08/01/21 0446  INR 1.1  APTT 31    BMP: Recent Labs  12/06/21 2350 03/12/22 1244 03/13/22 0332 03/23/22 0201 03/23/22 0240  NA 132* 131* 133* 134* 133*  K 3.4* 3.6 3.6 4.4 4.4  CL 95* 93* 95* 100 103  CO2 21* 22 21* 16*  --   GLUCOSE 258* 159* 170* 252* 249*  BUN 41* 45* 55* 71* 69*  CALCIUM 8.3* 8.2* 8.1* 8.2*  --   CREATININE 11.73* 9.55* 10.55* 11.12* 12.60*  GFRNONAA 5* 6* 6* 5*  --     LIVER FUNCTION TESTS: Recent Labs    07/31/21 0041 08/03/21 1013 10/27/21 1310 12/06/21 2350 03/12/22 1244 03/13/22 0332  BILITOT 0.6  --  0.4 1.4* 1.5*  --   AST 18  --  18 23 30   --   ALT 17  --  12 21 21   --   ALKPHOS 38  --  84 65 127*  --   PROT 6.3*  --  7.4 6.7 7.5  --   ALBUMIN 2.9*   < > 4.4 3.3* 3.2* 2.8*   < > = values in this interval not displayed.    TUMOR MAR  Assessment and Plan:  46 y.o. male outpatient. History of DM, HTN, GERD, CAD s/p MI and CABG., ESRD on HD. Per Team they have decling flow. Team is requesting a fistulogram with possible intervention. Patient has a left upper arm brachial to cephalic vein AVF.  No recent fistulogram noted in EPIC. No pertinent imaging. *** No recent labs. Patient is on ASA and plavix. No pertinent imaging. Patient has been NPO since midnight.   Thank you for this interesting consult.  I greatly enjoyed meeting Jeffry Vogelsang Sr. and look forward to participating in their care.  A copy of this report was sent to the requesting provider on this date.  Electronically Signed: Jacqualine Mau, NP 07/04/2022, 8:56 PM   I spent a total of {New SHFW:[id number]}  {New Out-Pt:[id number]}  {Established Out-Pt:[id number]} in face to face in clinical consultation, greater than 50% of which was counseling/coordinating care for ***

## 2022-07-05 ENCOUNTER — Other Ambulatory Visit: Payer: Self-pay | Admitting: Student

## 2022-07-05 DIAGNOSIS — N186 End stage renal disease: Secondary | ICD-10-CM

## 2022-07-06 ENCOUNTER — Ambulatory Visit (HOSPITAL_COMMUNITY): Admission: RE | Admit: 2022-07-06 | Payer: Medicaid Other | Source: Ambulatory Visit

## 2022-07-06 ENCOUNTER — Encounter (HOSPITAL_COMMUNITY): Payer: Self-pay

## 2022-08-06 ENCOUNTER — Other Ambulatory Visit: Payer: Self-pay

## 2022-08-06 ENCOUNTER — Emergency Department (HOSPITAL_COMMUNITY)
Admission: EM | Admit: 2022-08-06 | Discharge: 2022-08-06 | Disposition: A | Discharge status: Home / Self Care | Payer: Medicaid Other | Attending: Emergency Medicine | Admitting: Emergency Medicine

## 2022-08-06 ENCOUNTER — Emergency Department (HOSPITAL_COMMUNITY): Payer: Medicaid Other

## 2022-08-06 ENCOUNTER — Encounter (HOSPITAL_COMMUNITY): Payer: Self-pay

## 2022-08-06 DIAGNOSIS — Z992 Dependence on renal dialysis: Secondary | ICD-10-CM | POA: Diagnosis not present

## 2022-08-06 DIAGNOSIS — R0602 Shortness of breath: Secondary | ICD-10-CM | POA: Diagnosis present

## 2022-08-06 DIAGNOSIS — E875 Hyperkalemia: Secondary | ICD-10-CM | POA: Diagnosis not present

## 2022-08-06 DIAGNOSIS — Z79899 Other long term (current) drug therapy: Secondary | ICD-10-CM | POA: Diagnosis not present

## 2022-08-06 DIAGNOSIS — Z7982 Long term (current) use of aspirin: Secondary | ICD-10-CM | POA: Insufficient documentation

## 2022-08-06 DIAGNOSIS — N186 End stage renal disease: Secondary | ICD-10-CM | POA: Diagnosis not present

## 2022-08-06 LAB — CBC
HCT: 36.8 % — ABNORMAL LOW (ref 39.0–52.0)
Hemoglobin: 12.2 g/dL — ABNORMAL LOW (ref 13.0–17.0)
MCH: 26.1 pg (ref 26.0–34.0)
MCHC: 33.2 g/dL (ref 30.0–36.0)
MCV: 78.6 fL — ABNORMAL LOW (ref 80.0–100.0)
Platelets: 248 10*3/uL (ref 150–400)
RBC: 4.68 MIL/uL (ref 4.22–5.81)
RDW: 22.4 % — ABNORMAL HIGH (ref 11.5–15.5)
WBC: 5.5 10*3/uL (ref 4.0–10.5)
nRBC: 0 % (ref 0.0–0.2)

## 2022-08-06 LAB — COMPREHENSIVE METABOLIC PANEL
ALT: 14 U/L (ref 0–44)
AST: 26 U/L (ref 15–41)
Albumin: 2.3 g/dL — ABNORMAL LOW (ref 3.5–5.0)
Alkaline Phosphatase: 193 U/L — ABNORMAL HIGH (ref 38–126)
Anion gap: 19 — ABNORMAL HIGH (ref 5–15)
BUN: 86 mg/dL — ABNORMAL HIGH (ref 6–20)
CO2: 19 mmol/L — ABNORMAL LOW (ref 22–32)
Calcium: 6.5 mg/dL — ABNORMAL LOW (ref 8.9–10.3)
Chloride: 94 mmol/L — ABNORMAL LOW (ref 98–111)
Creatinine, Ser: 12.76 mg/dL — ABNORMAL HIGH (ref 0.61–1.24)
GFR, Estimated: 4 mL/min — ABNORMAL LOW (ref >60)
Glucose, Bld: 89 mg/dL (ref 70–99)
Potassium: 5.7 mmol/L — ABNORMAL HIGH (ref 3.5–5.1)
Sodium: 132 mmol/L — ABNORMAL LOW (ref 135–145)
Total Bilirubin: 2.5 mg/dL — ABNORMAL HIGH (ref 0.3–1.2)
Total Protein: 8.4 g/dL — ABNORMAL HIGH (ref 6.5–8.1)

## 2022-08-06 LAB — I-STAT CHEM 8, ED
BUN: 72 mg/dL — ABNORMAL HIGH (ref 6–20)
Calcium, Ion: 0.83 mmol/L — CL (ref 1.15–1.40)
Chloride: 99 mmol/L (ref 98–111)
Creatinine, Ser: 13.2 mg/dL — ABNORMAL HIGH (ref 0.61–1.24)
Glucose, Bld: 72 mg/dL (ref 70–99)
HCT: 38 % — ABNORMAL LOW (ref 39.0–52.0)
Hemoglobin: 12.9 g/dL — ABNORMAL LOW (ref 13.0–17.0)
Potassium: 5.6 mmol/L — ABNORMAL HIGH (ref 3.5–5.1)
Sodium: 130 mmol/L — ABNORMAL LOW (ref 135–145)
TCO2: 22 mmol/L (ref 22–32)

## 2022-08-06 LAB — HEPATITIS B SURFACE ANTIGEN: Hepatitis B Surface Ag: NONREACTIVE

## 2022-08-06 MED ORDER — CALCITRIOL 0.5 MCG PO CAPS: 0.5000 ug | ORAL_CAPSULE | ORAL | Status: DC

## 2022-08-06 MED ORDER — CALCIUM GLUCONATE 10 % IV SOLN
1.0000 g | Freq: Once | INTRAVENOUS | Status: AC
  Administered 2022-08-06: 1 g via INTRAVENOUS
  Filled 2022-08-06: qty 10

## 2022-08-06 MED ORDER — LIDOCAINE-PRILOCAINE 2.5-2.5 % EX CREA: 1.0000 | TOPICAL_CREAM | CUTANEOUS | Status: DC | PRN

## 2022-08-06 MED ORDER — MIDODRINE HCL 5 MG PO TABS
ORAL_TABLET | ORAL | Status: AC
  Filled 2022-08-06: qty 2

## 2022-08-06 MED ORDER — HEPARIN SODIUM (PORCINE) 1000 UNIT/ML DIALYSIS
2500.0000 [IU] | Freq: Once | INTRAMUSCULAR | Status: DC
  Filled 2022-08-06: qty 3

## 2022-08-06 MED ORDER — LIDOCAINE HCL (PF) 1 % IJ SOLN: 5.0000 mL | INTRAMUSCULAR | Status: DC | PRN

## 2022-08-06 MED ORDER — CHLORHEXIDINE GLUCONATE CLOTH 2 % EX PADS: 6.0000 | MEDICATED_PAD | Freq: Every day | CUTANEOUS | Status: DC

## 2022-08-06 MED ORDER — HEPARIN SODIUM (PORCINE) 1000 UNIT/ML DIALYSIS: 1000.0000 [IU] | INTRAMUSCULAR | Status: DC | PRN

## 2022-08-06 MED ORDER — PENTAFLUOROPROP-TETRAFLUOROETH EX AERO: 1.0000 | INHALATION_SPRAY | CUTANEOUS | Status: DC | PRN

## 2022-08-06 MED ORDER — MIDODRINE HCL 5 MG PO TABS
10.0000 mg | ORAL_TABLET | Freq: Once | ORAL | Status: AC
  Administered 2022-08-06: 10 mg via ORAL

## 2022-08-06 MED ORDER — DEXTROSE 50 % IV SOLN
25.0000 g | Freq: Once | INTRAVENOUS | Status: AC
  Administered 2022-08-06: 25 g via INTRAVENOUS
  Filled 2022-08-06: qty 50

## 2022-08-06 MED ORDER — SODIUM BICARBONATE 8.4 % IV SOLN
50.0000 meq | Freq: Once | INTRAVENOUS | Status: AC
  Administered 2022-08-06: 50 meq via INTRAVENOUS
  Filled 2022-08-06: qty 50

## 2022-08-06 MED ORDER — HEPARIN SODIUM (PORCINE) 1000 UNIT/ML IJ SOLN
INTRAMUSCULAR | Status: AC
  Filled 2022-08-06: qty 3

## 2022-08-06 NOTE — ED Provider Notes (Signed)
3:31 PM Care assumed from Dr. Ashok Cordia.  At time of transfer of care, patient will be going to dialysis with nephrology.  Given the new oxygen requirement, plan of care is to reassess to determine if dialysis has resolved this issue.  If he is off of supplemental oxygen and is feeling better, patient may be candidate for discharge home.  At this time plan is to have him return back to the emergency department for reassessment after dialysis.   Lizann Edelman, Gwenyth Allegra, MD 08/06/22 [phone]

## 2022-08-06 NOTE — ED Triage Notes (Addendum)
Pt BIB [facility] from his dialysis center c/o [facility] x2 days. Pt has not had his treatment for the last week and did not stay for his treatment today. Pt also endorses N/V since Monday. Dialysis center stated pt's oxygen was in the 60's. Pt was placed on a non-rebreather. Pt received 4mg  of zofran with EMS.

## 2022-08-06 NOTE — ED Notes (Signed)
Pt transported to dialysis

## 2022-08-06 NOTE — ED Provider Notes (Addendum)
[facility] EMERGENCY DEPARTMENT Provider Note   CSN: [id number] Arrival date & time: 08/06/22  1115     History  Chief Complaint  Patient presents with   Shortness of [place] Sr. is a 46 y.o. male.  Pt with hx esrd/hd m/w/f, c/o last going to dialysis one week ago, and feeling sob as if needed dialysis today - indicates he went to his dialysis center [facility]) and they sent to ED.    Pt denies chest pain or discomfort. No increased lower extremity edema (has bil bkas). Occasional non prod cough. No sore throat or other uri symptoms. No fever or chills.   The history is provided by the patient, medical records and the EMS personnel.  Shortness of Breath Associated symptoms: no abdominal pain, no chest pain, no fever, no headaches, no neck pain, no rash, no sore throat and no vomiting        Home Medications Prior to Admission medications   Medication Sig Start Date End Date Taking? Authorizing Provider  acetaminophen (TYLENOL) 325 MG tablet Take 2 tablets (650 mg total) by mouth every 4 (four) hours as needed for headache or mild pain. 09/08/18   Geradine Girt, DO  aspirin EC 81 MG tablet Take 1 tablet (81 mg total) by mouth daily. 02/07/18   Burtis Junes, NP  atorvastatin (LIPITOR) 80 MG tablet Take 1 tablet (80 mg total) by mouth daily. 10/27/21   Elgie Collard, PA-C  Blood Pressure Monitoring (BLOOD PRESSURE MONITOR/S CUFF) MISC Check blood pressure every day 10/27/21   Elgie Collard, PA-C  calcitRIOL (ROCALTROL) 0.25 MCG capsule Take 5 capsules (1.25 mcg total) by mouth every Monday, Wednesday, and Friday with hemodialysis. 11/09/17   Elgie Collard, PA-C  calcium carbonate (TUMS - DOSED IN MG ELEMENTAL CALCIUM) 500 MG chewable tablet Chew 500 mg by mouth daily. 04/25/20   [provider]  clopidogrel (PLAVIX) 75 MG tablet Take 1 tablet (75 mg total) by mouth daily. 10/27/21   Elgie Collard, PA-C  Darbepoetin Alfa  (ARANESP) 200 MCG/0.4ML SOSY injection Inject 0.4 mLs (200 mcg total) into the vein every Monday with hemodialysis. 12/04/18   Angiulli, Lavon Paganini, PA-C  esomeprazole (NEXIUM) 40 MG capsule Take 40 mg by mouth daily at 12 noon.    [provider]  isosorbide mononitrate (IMDUR) 30 MG 24 hr tablet Take 1 tablet (30 mg total) by mouth daily. 12/22/21   Richardson Dopp T, PA-C  loratadine (CLARITIN) 10 MG tablet Take 10 mg by mouth daily.    [provider]  methocarbamol (ROBAXIN) 500 MG tablet Take 1,000 mg by mouth 4 (four) times daily as needed for muscle spasms. 06/17/20   [provider]  metoprolol succinate (TOPROL XL) 25 MG 24 hr tablet Take 1 tablet (25 mg total) by mouth daily. 12/22/21   Richardson Dopp T, PA-C  multivitamin (RENA-VIT) TABS tablet Take 1 tablet by mouth at bedtime. 08/17/18   Kayleen Memos, DO  nitroGLYCERIN (NITROSTAT) 0.4 MG SL tablet Place 1 tablet (0.4 mg total) under the tongue every 5 (five) minutes as needed for chest pain. 10/27/21   Elgie Collard, PA-C  SEVELAMER HCL PO Take by mouth in the morning, at noon, and at bedtime.    [provider]      Allergies    Coconut (cocos nucifera)    Review of Systems   Review of Systems  Constitutional:  Negative  for chills and fever.  HENT:  Negative for sore throat.   Eyes:  Negative for redness.  Respiratory:  Positive for shortness of breath.   Cardiovascular:  Negative for chest pain and leg swelling.  Gastrointestinal:  Negative for abdominal pain and vomiting.  Genitourinary:  Negative for flank pain.  Musculoskeletal:  Negative for back pain and neck pain.  Skin:  Negative for rash.  Neurological:  Negative for headaches.  Hematological:  Does not bruise/bleed easily.  Psychiatric/Behavioral:  Negative for confusion.     Physical Exam Updated Vital Signs BP (!) 96/58   Pulse 65   Temp 98.3 F (36.8 C)   Resp (!) 32   SpO2 99%  Physical Exam Vitals and nursing note reviewed.   Constitutional:      Appearance: Normal appearance. He is well-developed.  HENT:     Head: Atraumatic.     Nose: Nose normal.     Mouth/Throat:     Mouth: Mucous membranes are moist.     Pharynx: Oropharynx is clear.  Eyes:     General: No scleral icterus.    Conjunctiva/sclera: Conjunctivae normal.  Neck:     Trachea: No tracheal deviation.  Cardiovascular:     Rate and Rhythm: Normal rate and regular rhythm.     Pulses: Normal pulses.     Heart sounds: Normal heart sounds. No murmur heard.    No friction rub. No gallop.  Pulmonary:     Effort: Pulmonary effort is normal. No accessory muscle usage or respiratory distress.     Comments: Rales bases.  Abdominal:     General: There is no distension.     Palpations: Abdomen is soft.     Tenderness: There is no abdominal tenderness. There is guarding.  Genitourinary:    Comments: No cva tenderness. Musculoskeletal:        General: No swelling or tenderness.     Cervical back: Normal range of motion and neck supple. No rigidity.     Comments: Bil bka without significant edema of legs. LUE av fistula with palp thrill, and no sign of infection of site.   Skin:    General: Skin is warm and dry.     Findings: No rash.  Neurological:     Mental Status: He is alert.     Comments: Alert, speech clear.   Psychiatric:        Mood and Affect: Mood normal.     ED Results / Procedures / Treatments   Labs (all labs ordered are listed, but only abnormal results are displayed) Results for orders placed or performed during the hospital encounter of 08/06/22  Comprehensive metabolic panel  Result Value Ref Range   Sodium 132 (L) 135 - 145 mmol/L   Potassium 5.7 (H) 3.5 - 5.1 mmol/L   Chloride 94 (L) 98 - 111 mmol/L   CO2 19 (L) 22 - 32 mmol/L   Glucose, Bld 89 70 - 99 mg/dL   BUN 86 (H) 6 - 20 mg/dL   Creatinine, Ser 12.76 (H) 0.61 - 1.24 mg/dL   Calcium 6.5 (L) 8.9 - 10.3 mg/dL   Total Protein 8.4 (H) 6.5 - 8.1 g/dL   Albumin  2.3 (L) 3.5 - 5.0 g/dL   AST 26 15 - 41 U/L   ALT 14 0 - 44 U/L   Alkaline Phosphatase 193 (H) 38 - 126 U/L   Total Bilirubin 2.5 (H) 0.3 - 1.2 mg/dL   GFR, Estimated 4 (L) >60 mL/min  Anion gap 19 (H) 5 - 15  CBC  Result Value Ref Range   WBC 5.5 4.0 - 10.5 K/uL   RBC 4.68 4.22 - 5.81 MIL/uL   Hemoglobin 12.2 (L) 13.0 - 17.0 g/dL   HCT 36.8 (L) 39.0 - 52.0 %   MCV 78.6 (L) 80.0 - 100.0 fL   MCH 26.1 26.0 - 34.0 pg   MCHC 33.2 30.0 - 36.0 g/dL   RDW 22.4 (H) 11.5 - 15.5 %   Platelets 248 150 - 400 K/uL   nRBC 0.0 0.0 - 0.2 %     EKG EKG Interpretation  Date/Time:  Friday August 06 2022 11:24:45 EST Ventricular Rate:  89 PR Interval:  174 QRS Duration: 94 QT Interval:  412 QTC Calculation: 501 R Axis:   4 Text Interpretation: Normal sinus rhythm Low voltage QRS Nonspecific T wave abnormality Prolonged QT When compared with ECG of 29-Mar-2022 16:06, PREVIOUS ECG IS PRESENT Confirmed by Lajean Saver [phone]) on 08/06/2022 12:01:20 PM  Radiology DG Chest Port 1 View  Result Date: 08/06/2022 CLINICAL DATA:  Shortness of breath EXAM: PORTABLE CHEST 1 VIEW COMPARISON:  CXR 03/29/22 FINDINGS: Status post median sternotomy and CABG. Enlarged cardiac contours, unchanged. No pleural effusion. No pneumothorax. Likely left basilar atelectasis. No displaced rib fractures. Visualized upper abdomen is unremarkable. IMPRESSION: Likely left basilar atelectasis. Cardiomegaly. Otherwise no acute cardiopulmonary process. Electronically Signed   By: Marin Roberts M.D.   On: 08/06/2022 12:45    Procedures Procedures    Medications Ordered in ED Medications - No data to display  ED Course/ Medical Decision Making/ A&P                           Medical Decision Making Problems Addressed: ESRD needing dialysis [facility]): acute illness or injury with systemic symptoms that poses a threat to life or bodily functions Hyperkalemia: acute illness or injury with systemic symptoms that poses a  threat to life or bodily functions Shortness of breath: acute illness or injury with systemic symptoms that poses a threat to life or bodily functions  Amount and/or Complexity of Data Reviewed Independent Historian: EMS    Details: hx External Data Reviewed: notes. Labs: ordered. Decision-making details documented in ED Course. Radiology: ordered and independent interpretation performed. Decision-making details documented in ED Course. ECG/medicine tests: ordered and independent interpretation performed. Decision-making details documented in ED Course. Discussion of management or test interpretation with external provider(s): Nephrology, discussed pt, missed hd, sob.   Risk Prescription drug management. Decision regarding hospitalization.   Iv ns. Continuous pulse ox and cardiac monitoring. Labs ordered/sent. Imaging ordered.   Diff dx includes esrd/hd, fluid overload, edema, pna, hyperkalemia, etc - dispo decision including potential need for admission considered - will get labs and imaging, consult nephrology, and reassess.   Reviewed nursing notes and prior charts for additional history. External reports reviewed. Additional history from: EMS.  On room air sats 80s, on 3 liters sats 96%.  Cardiac monitor: sinus rhythm, rate 70.  Labs reviewed/interpreted by me - k elev.   Xrays reviewed/interpreted by me - ?mild vascular congestion.   Nephrology consulted. Discussed pt - will facilitate hd. As k elev, and not clear how soon pt able to go to hd, will give ca glu, d50, hcl3 ins.   Recheck pt, awake, alert, breathing comfortably.  CRITICAL CARE RE: ESRD missed HD requiring emergent hemodialysis, sob, hyperkalemia, hypoxia.  Performed by: Mirna Mires Total critical care  time: 40 minutes Critical care time was exclusive of separately billable procedures and treating other patients. Critical care was necessary to treat or prevent imminent or life-threatening  deterioration. Critical care was time spent personally by me on the following activities: development of treatment plan with patient and/or surrogate as well as nursing, discussions with consultants, evaluation of patient's response to treatment, examination of patient, obtaining history from patient or surrogate, ordering and performing treatments and interventions, ordering and review of laboratory studies, ordering and review of radiographic studies, pulse oximetry and re-evaluation of patient's condition.  If patient clinically improved, at baseline, post dialysis, possible d/c to home then.           Final Clinical Impression(s) / ED Diagnoses Final diagnoses:  None    Rx / DC Orders ED Discharge Orders     None         Lajean Saver, MD 08/06/22 1511

## 2022-08-06 NOTE — Progress Notes (Signed)
Patient came from the ED to dialysis. When I got the patients blood pressure it was 80's/60's and kept dropping. Changed cuffs and location from upper arm to forearm. BP was still dropping so I can called rapid response to be on the safe side. Rapid Response came and assessed him and helped me get a reasonable realistic blood pressure which was 120's/90's. Then after rapid response left his BP started to drop again so I got a verbal order from Juanell Fairly, NP for 10mg  of midodrine. Gave that to patient and received a reasonable blood pressure so started treatment. After treatment started running his BP eventually dropped again. I tried to get a manual and pt tore BP cuff of threw it towards me and told me " to get this fucking shit off of him." From there I stepped away for a few and then came back and tried to put the automatic BP cuff back on him. I was successful putting it on but BP hasn't taken. For the first few checks. Attempting again as long as pt is cooperative.

## 2022-08-06 NOTE — ED Provider Notes (Signed)
Medical history including end-stage renal disease dialysis Monday Wednesday Friday, hypertension, CHF, CAD, presents with complaints of shortness of breath and feeling more overloaded.  Patient states that he had not gone dialysis a week's time due to transportation error, he has no other complaints just needs to get his dialysis completed.  Dr. Delice Lesch all saw him originally, patient had elevated potassium, anion gap, and was requiring oxygen on arrival he spoke with nephrology who recommends dialysis, patient can be discharged once dialysis completed.  Physical Exam  BP 114/72 (BP Location: Right Arm)   Pulse 94   Temp (!) 97.5 F (36.4 C) (Oral)   Resp (!) 21   Wt 64.8 kg Comment: stretcher  SpO2 98%   BMI 18.34 kg/m   Physical Exam Vitals and nursing note reviewed.  Constitutional:      General: He is not in acute distress.    Appearance: He is not ill-appearing.  HENT:     Head: Normocephalic and atraumatic.     Nose: No congestion.  Eyes:     Conjunctiva/sclera: Conjunctivae normal.  Cardiovascular:     Rate and Rhythm: Normal rate and regular rhythm.     Pulses: Normal pulses.     Heart sounds: No murmur heard.    No friction rub. No gallop.  Pulmonary:     Effort: No respiratory distress.     Breath sounds: No wheezing, rhonchi or rales.  Musculoskeletal:     Comments: Bilateral amputee above the knee  Skin:    General: Skin is warm and dry.     Comments: AV fistula left AC no evidence of infection good palpable thrill  Neurological:     Mental Status: He is alert.  Psychiatric:        Mood and Affect: Mood normal.     Procedures  Procedures  ED Course / MDM    Medical Decision Making Amount and/or Complexity of Data Reviewed Labs: ordered. Radiology: ordered.  Risk Prescription drug management.      Lab Tests:  I Ordered, and personally interpreted labs.  The pertinent results include: CBC shows normocytic anemia hemoglobin 12.2, CMP shows sodium  132 potassium 5.7 CO2 19 creatinine 12, T. bili 2.5 and gap 19   Imaging Studies ordered:  I ordered imaging studies including chest x-ray I independently visualized and interpreted imaging which showed cardiomegaly I agree with the radiologist interpretation   Cardiac Monitoring:  The patient was maintained on a cardiac monitor.  I personally viewed and interpreted the cardiac monitored which showed an underlying rhythm of: Without signs of ischemia   Medicines ordered and prescription drug management:  I ordered medication including N/A I have reviewed the patients home medicines and have made adjustments as needed  Critical Interventions:  N/a   Reevaluation:  Presents with volume overload from missed dialysis, patient returned from dialysis treatment, states she is feeling much better, he is currently at 98% on room air, lung sounds are clear bilaterally, he states he is ready to go home.  Consultations Obtained:  N/A   Test Considered:  N/A   Dispostion and problem list  After consideration of the diagnostic results and the patients response to treatment, I feel that the patent would benefit from discharge.  Missed dialysis-patient received dialysis today, he is hemodynamically stable, encouraged him to follow-up with his dialysis treatment on Sunday.           Marcello Fennel, PA-C 08/06/22 2312    Quintella Reichert, MD 08/07/22  0101  

## 2022-08-06 NOTE — Discharge Instructions (Addendum)
It was our pleasure to provide your ER care today - we hope that you feel better.  Make sure to go to all of your dialysis sessions as scheduled, and do not miss any sessions.   Follow up closely with primary care doctor in the coming week.  Return to ER if worse, new symptoms, fevers, chest pain, trouble breathing, or other concern.

## 2022-08-06 NOTE — Progress Notes (Signed)
Received patient in bed to unit.  Alert and oriented.  Informed consent signed and in chart.   Treatment initiated: 1644 Treatment completed: 2056  Patient tolerated well.  Transported back to the room  Alert, without acute distress.  Hand-off given to patient's nurse.   Access used: avf Access issues: none  Total UF removed: 3900 Medication(s) given: none Post HD VS: see table below Post HD weight: 64.8 kg   08/06/22 2106  Vitals  BP 114/72  MAP (mmHg) 83  BP Location Right Arm  BP Method Automatic  Patient Position (if appropriate) Lying  Pulse Rate Source Monitor  ECG Heart Rate 95  Oxygen Therapy  SpO2 98 %  O2 Device Nasal Cannula  During Treatment Monitoring  Intra-Hemodialysis Comments Tolerated well  Post Treatment  Dialyzer Clearance Lightly streaked  Duration of HD Treatment -hour(s) 3.75 hour(s)  Hemodialysis Intake (mL) 0 mL  Liters Processed 76  Fluid Removed (mL) 3.9 mL  Tolerated HD Treatment Yes  AVG/AVF Arterial Site Held (minutes) 5 minutes  AVG/AVF Venous Site Held (minutes) 5 minutes  Fistula / Graft Left Upper arm Arteriovenous fistula  No Placement Date or Time found.   Placed prior to admission: Yes  Orientation: Left  Access Location: Upper arm  Access Type: Arteriovenous fistula  Site Condition No complications  Fistula / Graft Assessment Present;Thrill;Bruit  Status Deaccessed;[facility]

## 2022-08-06 NOTE — Consult Note (Signed)
[facility] Renal Consultation Note    Indication for Consultation:  Management of ESRD/hemodialysis; anemia, hypertension/volume and secondary hyperparathyroidism PCP:  HPI: [name] Labella Sr. is a 46 y.o. male with ESRD on hemodialysis MWF at [facility]. PMH: DMT2, Bilateral BKA, HTN, HFrEF, GIB, CAD S/P CABG 2019, medical noncompliance.   Patient presented to [facility] this AM for dialysis after missing 2 treatments-no HD since 07/30/2022 at which he signed off after 2:38 hours, left 6.8 kg above OP EDW. Has been averaging approximate 5-6 hours of HD a week. Missed and truncated treatments. He was noted to have O2 sat 60%, EMS was called. He was brought to ED for evaluation. Na+132 K+ 5.7 CO2 19 SCr 12.7 BUN 86. WBC 5.5 HGB 12.2 PLT 248.  C/O nausea-possibly mild uremia present, no asterixis. Face is puffy, with WOB present. Volume overload from noncompliance with dialysis. Mild uremia. Will order dialysis for volume removal. Hopefully he can be discharged post HD.   Past Medical History:  Diagnosis Date   Anemia    Atherosclerosis of lower extremity (HCC)    Blood clot in vein    right calf   CAD (coronary artery disease)    Cataracts, bilateral    Chronic combined systolic and diastolic heart failure (HCC)    Complication of anesthesia    Depression    ESRD (end stage renal disease) on dialysis ([place])    "MWF [place]" (03/08/2017)   GERD (gastroesophageal reflux disease)    Heart murmur    History of blood transfusion    "related to OR"   Hypertension    Myocardial infarction ([place])     " light"   Nonhealing surgical wound    nonviable tissue   PONV (postoperative nausea and vomiting)    S/P unilateral BKA (below knee amputation), left ([place])    Type II diabetes mellitus ([place])    Wears glasses    Past Surgical History:  Procedure Laterality Date   ABDOMINAL AORTOGRAM N/A 11/02/2016   Procedure: Abdominal Aortogram;  Surgeon: Waynetta Sandy, MD;  Location: [place] CV [facility];  Service: Cardiovascular;  Laterality: N/A;   ABDOMINAL AORTOGRAM W/LOWER EXTREMITY Right 08/07/2018   Procedure: ABDOMINAL AORTOGRAM W/LOWER EXTREMITY;  Surgeon: [name], MD;  Location: [place] [facility];  Service: Cardiovascular;  Laterality: Right;   AMPUTATION Left 09/27/2013   Procedure: LEFT GREAT TOE AMPUTATION;  Surgeon: [name], MD;  Location: [place];  Service: Orthopedics;  Laterality: Left;   AMPUTATION Right 08/15/2015   Procedure: Right Great Toe Amputation;  Surgeon: [name], MD;  Location: [place];  Service: Orthopedics;  Laterality: Right;   AMPUTATION Left 11/05/2016   Procedure: TRANSMETATARSAL AMPUTATION LEFT FOOT;  Surgeon: [name], MD;  Location: [place];  Service: Orthopedics;  Laterality: Left;   AMPUTATION Left 03/11/2017   Procedure: LEFT BELOW KNEE AMPUTATION;  Surgeon: [name], MD;  Location: [place];  Service: Orthopedics;  Laterality: Left;   AMPUTATION Right 03/11/2017   Procedure: RIGHT 2ND TOE AMPUTATION;  Surgeon: [name], MD;  Location: [place];  Service: Orthopedics;  Laterality: Right;   AMPUTATION Right 11/17/2018   Procedure: AMPUTATION BELOW KNEE;  Surgeon: [name], MD;  Location: [place];  Service: Vascular;  Laterality: Right;   AV FISTULA PLACEMENT  left arm   CORONARY ARTERY BYPASS GRAFT N/A 11/04/2017   Procedure: CORONARY ARTERY BYPASS GRAFTING (CABG) x three, using left internal mammary artery  and right    leg greater saphenous vein;  Surgeon: Melrose Nakayama, MD;  Location: [place];  Service: Open Heart Surgery;  Laterality: N/A;   ESOPHAGOGASTRODUODENOSCOPY (EGD) WITH PROPOFOL N/A 08/02/2021   Procedure: ESOPHAGOGASTRODUODENOSCOPY (EGD) WITH PROPOFOL;  Surgeon: Yetta Flock, MD;  Location: [place];  Service: Gastroenterology;  Laterality: N/A;   FASCIOTOMY Right 08/07/2018   Procedure: FOUR COMPARTMENT FASCIOTOMY OF RIGHT LOWER LEG;   Surgeon: Rosetta Posner, MD;  Location: [place];  Service: Vascular;  Laterality: Right;   FASCIOTOMY CLOSURE Right 08/10/2018   Procedure: FASCIOTOMY CLOSURE RIGHT LOWER EXTREMITY;  Surgeon: [name], MD;  Location: [place];  Service: Vascular;  Laterality: Right;   HEMATOMA EVACUATION Right 08/07/2018   Procedure: EVACUATION HEMATOMA;  Surgeon: Rosetta Posner, MD;  Location: [place];  Service: Vascular;  Laterality: Right;   HEMOSTASIS CLIP PLACEMENT  08/02/2021   Procedure: HEMOSTASIS CLIP PLACEMENT;  Surgeon: Yetta Flock, MD;  Location: [place] [facility];  Service: Gastroenterology;;   HOT HEMOSTASIS N/A 08/02/2021   Procedure: HOT HEMOSTASIS (ARGON PLASMA COAGULATION/BICAP);  Surgeon: Yetta Flock, MD;  Location: [facility] ENDOSCOPY;  Service: Gastroenterology;  Laterality: N/A;   LEFT HEART CATH AND CORONARY ANGIOGRAPHY N/A 10/31/2017   Procedure: LEFT HEART CATH AND CORONARY ANGIOGRAPHY;  Surgeon: Troy Sine, MD;  Location: [place] [facility];  Service: Cardiovascular;  Laterality: N/A;   LEFT HEART CATHETERIZATION WITH CORONARY ANGIOGRAM N/A 09/13/2014   Procedure: LEFT HEART CATHETERIZATION WITH CORONARY ANGIOGRAM;  Surgeon: Sinclair Grooms, MD;  Location: [facility] CATH LAB;  Service: Cardiovascular;  Laterality: N/A;   LOWER EXTREMITY ANGIOGRAPHY Bilateral 11/02/2016   Procedure: Lower Extremity Angiography;  Surgeon: Waynetta Sandy, MD;  Location: [place] [facility];  Service: Cardiovascular;  Laterality: Bilateral;   PERIPHERAL VASCULAR ATHERECTOMY Left 11/02/2016   Procedure: Peripheral Vascular Atherectomy;  Surgeon: Waynetta Sandy, MD;  Location: [place] [facility];  Service: Cardiovascular;  Laterality: Left;  PERONEAL   PERIPHERAL VASCULAR ATHERECTOMY Right 08/07/2018   Procedure: PERIPHERAL VASCULAR ATHERECTOMY;  Surgeon: [name], MD;  Location: [place] [facility];  Service: Cardiovascular;  Laterality: Right;  Peroneal artery   PERIPHERAL  VASCULAR BALLOON ANGIOPLASTY Right 08/07/2018   Procedure: PERIPHERAL VASCULAR BALLOON ANGIOPLASTY;  Surgeon: [name], MD;  Location: [place] [facility];  Service: Cardiovascular;  Laterality: Right;  anterior tibial artery   STUMP REVISION Left 01/11/2017   Procedure: Revision Left Transmetatarsal Amputation;  Surgeon: [name], MD;  Location: [place];  Service: Orthopedics;  Laterality: Left;   TEE WITHOUT CARDIOVERSION N/A 11/04/2017   Procedure: TRANSESOPHAGEAL ECHOCARDIOGRAM (TEE);  Surgeon: Melrose Nakayama, MD;  Location: [place];  Service: Open Heart Surgery;  Laterality: N/A;   TRANSMETATARSAL AMPUTATION Right 08/10/2018   Procedure: RIGHT FIFTH TOE AMPUTATION;  Surgeon: [name], MD;  Location: [facility] OR;  Service: Vascular;  Laterality: Right;   TRANSMETATARSAL AMPUTATION Right 09/14/2018   Procedure: TRANSMETATARSAL AMPUTATION;  Surgeon: [name], MD;  Location: [facility] OR;  Service: Vascular;  Laterality: Right;   Family History  Problem Relation Age of Onset   Heart failure Mother    Hypertension Mother    Social History:  reports that he quit smoking about 3 years ago. His smoking use included cigarettes. He has a 13.00 pack-year smoking history. He has never used smokeless tobacco. He reports that he does not currently use alcohol. He reports current drug use. Drug: Marijuana. Allergies  Allergen Reactions   Coconut (Cocos Nucifera) Anaphylaxis and Other (See Comments)    Can use topically, allergic to coconut foods    Prior to Admission medications   Medication Sig Start Date End Date Taking? Authorizing Provider  acetaminophen (TYLENOL) 325 MG tablet Take 2 tablets (650 mg total) by mouth every 4 (four) hours as needed for headache or mild pain. 09/08/18  Yes Vann, Jessica U, DO  atorvastatin (LIPITOR) 80 MG tablet Take 1 tablet (80 mg total) by mouth daily. 10/27/21  Yes Conte, Tessa N, PA-C  bisoprolol (ZEBETA) 5 MG tablet Take 2.5 mg by  mouth daily. 07/06/22  Yes [provider]  Blood Pressure Monitoring (BLOOD PRESSURE MONITOR/S CUFF) MISC Check blood pressure every day Patient taking differently: 1 each by Other route See admin instructions. Check blood pressure every day 10/27/21  Yes Harriet Pho, Tessa N, PA-C  calcitRIOL (ROCALTROL) 0.25 MCG capsule Take 5 capsules (1.25 mcg total) by mouth every Monday, Wednesday, and Friday with hemodialysis. 11/09/17  Yes Conte, Tessa N, PA-C  calcium acetate (PHOSLO) 667 MG capsule Take 2,001 mg by mouth 3 (three) times daily. 07/02/22  Yes [provider]  calcium carbonate (TUMS - DOSED IN MG ELEMENTAL CALCIUM) 500 MG chewable tablet Chew 500 mg by mouth daily. 04/25/20  Yes [provider]  clopidogrel (PLAVIX) 75 MG tablet Take 1 tablet (75 mg total) by mouth daily. 10/27/21  Yes Harriet Pho, Tessa N, PA-C  Darbepoetin Alfa (ARANESP) 200 MCG/0.4ML SOSY injection Inject 0.4 mLs (200 mcg total) into the vein every Monday with hemodialysis. 12/04/18  Yes Angiulli, Lavon Paganini, PA-C  esomeprazole (NEXIUM) 40 MG capsule Take 40 mg by mouth daily at 12 noon.   Yes [provider]  isosorbide mononitrate (IMDUR) 30 MG 24 hr tablet Take 1 tablet (30 mg total) by mouth daily. 12/22/21  Yes Weaver, Scott T, PA-C  loratadine (CLARITIN) 10 MG tablet Take 10 mg by mouth daily as needed for allergies.   Yes [provider]  methocarbamol (ROBAXIN) 500 MG tablet Take 1,000 mg by mouth 3 (three) times daily. 06/17/20  Yes [provider]  multivitamin (RENA-VIT) TABS tablet Take 1 tablet by mouth at bedtime. 08/17/18  Yes Kayleen Memos, DO  nitroGLYCERIN (NITROSTAT) 0.4 MG SL tablet Place 1 tablet (0.4 mg total) under the tongue every 5 (five) minutes as needed for chest pain. 10/27/21  Yes Conte, Tessa N, PA-C  ondansetron (ZOFRAN-ODT) 4 MG disintegrating tablet Take 4 mg by mouth every 8 (eight) hours as needed for nausea or vomiting. 07/02/22  Yes [provider]   SEVELAMER HCL PO Take 1 tablet by mouth See admin instructions. On M, W, F - Dialysis days   Yes [provider]  aspirin EC 81 MG tablet Take 1 tablet (81 mg total) by mouth daily. Patient not taking: Reported on 08/06/2022 02/07/18   Burtis Junes, NP  metoprolol succinate (TOPROL XL) 25 MG 24 hr tablet Take 1 tablet (25 mg total) by mouth daily. Patient not taking: Reported on 08/06/2022 12/22/21   Richardson Dopp T, PA-C   Current Facility-Administered Medications  Medication Dose Route Frequency Provider Last Rate Last Admin   [START ON 08/07/2022] Chlorhexidine Gluconate Cloth 2 % PADS 6 each  6 each Topical Q0600 Valentina Gu, NP       Current Outpatient Medications  Medication Sig Dispense Refill   acetaminophen (TYLENOL) 325 MG tablet Take 2 tablets (650 mg total) by mouth every 4 (four) hours  as needed for headache or mild pain.     atorvastatin (LIPITOR) 80 MG tablet Take 1 tablet (80 mg total) by mouth daily. 90 tablet 3   bisoprolol (ZEBETA) 5 MG tablet Take 2.5 mg by mouth daily.     Blood Pressure Monitoring (BLOOD PRESSURE MONITOR/S CUFF) MISC Check blood pressure every day (Patient taking differently: 1 each by Other route See admin instructions. Check blood pressure every day) 1 each 0   calcitRIOL (ROCALTROL) 0.25 MCG capsule Take 5 capsules (1.25 mcg total) by mouth every Monday, Wednesday, and Friday with hemodialysis. 30 capsule 1   calcium acetate (PHOSLO) 667 MG capsule Take 2,001 mg by mouth 3 (three) times daily.     calcium carbonate (TUMS - DOSED IN MG ELEMENTAL CALCIUM) 500 MG chewable tablet Chew 500 mg by mouth daily.     clopidogrel (PLAVIX) 75 MG tablet Take 1 tablet (75 mg total) by mouth daily. 90 tablet 3   Darbepoetin Alfa (ARANESP) 200 MCG/0.4ML SOSY injection Inject 0.4 mLs (200 mcg total) into the vein every Monday with hemodialysis. 1.68 mL    esomeprazole (NEXIUM) 40 MG capsule Take 40 mg by mouth daily at 12 noon.     isosorbide  mononitrate (IMDUR) 30 MG 24 hr tablet Take 1 tablet (30 mg total) by mouth daily. 90 tablet 3   loratadine (CLARITIN) 10 MG tablet Take 10 mg by mouth daily as needed for allergies.     methocarbamol (ROBAXIN) 500 MG tablet Take 1,000 mg by mouth 3 (three) times daily.     multivitamin (RENA-VIT) TABS tablet Take 1 tablet by mouth at bedtime. 30 tablet 0   nitroGLYCERIN (NITROSTAT) 0.4 MG SL tablet Place 1 tablet (0.4 mg total) under the tongue every 5 (five) minutes as needed for chest pain. 25 tablet 5   ondansetron (ZOFRAN-ODT) 4 MG disintegrating tablet Take 4 mg by mouth every 8 (eight) hours as needed for nausea or vomiting.     SEVELAMER HCL PO Take 1 tablet by mouth See admin instructions. On M, W, F - Dialysis days     aspirin EC 81 MG tablet Take 1 tablet (81 mg total) by mouth daily. (Patient not taking: Reported on 08/06/2022) 30 tablet 11   metoprolol succinate (TOPROL XL) 25 MG 24 hr tablet Take 1 tablet (25 mg total) by mouth daily. (Patient not taking: Reported on 08/06/2022) 90 tablet 3   Labs: Basic Metabolic Panel: Recent Labs  Lab 08/06/22 1205  NA 132*  K 5.7*  CL 94*  CO2 19*  GLUCOSE 89  BUN 86*  CREATININE 12.76*  CALCIUM 6.5*   Liver Function Tests: Recent Labs  Lab 08/06/22 1205  AST 26  ALT 14  ALKPHOS 193*  BILITOT 2.5*  PROT 8.4*  ALBUMIN 2.3*   No results for input(s): "LIPASE", "AMYLASE" in the last 168 hours. No results for input(s): "AMMONIA" in the last 168 hours. CBC: Recent Labs  Lab 08/06/22 1205  WBC 5.5  HGB 12.2*  HCT 36.8*  MCV 78.6*  PLT 248   Cardiac Enzymes: No results for input(s): "CKTOTAL", "CKMB", "CKMBINDEX", "TROPONINI" in the last 168 hours. CBG: No results for input(s): "GLUCAP" in the last 168 hours. Iron Studies: No results for input(s): "IRON", "TIBC", "TRANSFERRIN", "FERRITIN" in the last 72 hours. Studies/Results: DG Chest Port 1 View  Result Date: 08/06/2022 CLINICAL DATA:  Shortness of breath EXAM:  PORTABLE CHEST 1 VIEW COMPARISON:  CXR 03/29/22 FINDINGS: Status post median sternotomy and CABG. Enlarged cardiac  contours, unchanged. No pleural effusion. No pneumothorax. Likely left basilar atelectasis. No displaced rib fractures. Visualized upper abdomen is unremarkable. IMPRESSION: Likely left basilar atelectasis. Cardiomegaly. Otherwise no acute cardiopulmonary process. Electronically Signed   By: Marin Roberts M.D.   On: 08/06/2022 12:45    ROS: As per HPI otherwise negative.  Physical Exam: Vitals:   08/06/22 1216 08/06/22 1245 08/06/22 1300 08/06/22 1335  BP: 103/71 99/70 (!) 93/40 102/83  Pulse: 88 91  86  Resp: 18 (!) 28 (!) 29 13  Temp: 98.4 F (36.9 C)     TempSrc: Oral     SpO2: 98% 96%  96%     General: Chronically ill appearing male who looks years older that actual age in no acute distress. Head: Normocephalic, atraumatic, sclera non-icteric, mucus membranes are moist. Face is puffy, periorbital edema.  Neck: Supple. JVD elevated 1/4 from mandible Lungs: Bilateral breath sounds decreased in bases with bibasilar crackles present. RR 24-32 shallow with mild WOB present.  Heart: RRR with S1 A1.6/5 systolic M. SR.  Abdomen: Distended, taunt, ? Ascites present. NABS.  Lower extremities:Bilateral BKAs-no stump edema Neuro: Alert and oriented X 3. Moves all extremities spontaneously. Psych:  Responds to questions appropriately with a normal affect. Dialysis Access:L AVF + T/B  Dialysis Orders: [facility] MWF 3:45 hrs 200NRe 450/Autoflow 1.5 77.4 kg 2.0K/3.5 Ca UFP2 AVF -Heparin 2500 units IV TIW -Calcitriol 0.50 mcg PO TIW   Assessment/Plan:  Volume overload in setting of noncompliance with HD-HD today on schedule. Lower volume as tolerated  Hyperkalemia-mild. K+ 5.7. 2.0 K bath.  ESRD -  MWF No HD since 07/30/22.   Hypertension/volume  -BP soft. UF as tolerated.   Anemia  - HGB 12.2 No ESA needed.   Metabolic bone disease -  C Ca 7.9. Using added Ca+ bath. Last OP PO4 9  range. Added PO4 today's labs. Renvela and Calcium acetate binders on OP med list. Use Calcium acetate while in hospital. Continue VDRA.   Nutrition - Low albumin. Renal/carb mod diet when able to eat. Protein supplements. DMT2-per primary HFrEF-attempt to optimize volume with dialysis  Riggs Dineen H. Owens Shark, NP-C 08/06/2022, 1:55 PM  [employer] [phone]

## 2022-08-06 NOTE — ED Notes (Signed)
Pt down from dialysis and needs discharging.

## 2022-08-07 LAB — HEPATITIS B SURFACE ANTIBODY, QUANTITATIVE: Hep B S AB Quant (Post): 83.8 m[IU]/mL (ref >9.9)

## 2022-08-16 DEATH — deceased
